# Patient Record
Sex: Female | Born: 1935 | Race: White | Hispanic: No | Smoking: Never smoker
Health system: Southern US, Community
[De-identification: ages and names within clinical notes are randomized; demographics above are authoritative.]

## PROBLEM LIST (undated history)

## (undated) DIAGNOSIS — I10 Essential (primary) hypertension: Secondary | ICD-10-CM

## (undated) DIAGNOSIS — M1990 Unspecified osteoarthritis, unspecified site: Secondary | ICD-10-CM

## (undated) DIAGNOSIS — G629 Polyneuropathy, unspecified: Secondary | ICD-10-CM

## (undated) DIAGNOSIS — N189 Chronic kidney disease, unspecified: Secondary | ICD-10-CM

## (undated) DIAGNOSIS — J302 Other seasonal allergic rhinitis: Secondary | ICD-10-CM

## (undated) DIAGNOSIS — E7800 Pure hypercholesterolemia, unspecified: Secondary | ICD-10-CM

## (undated) DIAGNOSIS — R197 Diarrhea, unspecified: Secondary | ICD-10-CM

## (undated) DIAGNOSIS — C4490 Unspecified malignant neoplasm of skin, unspecified: Secondary | ICD-10-CM

## (undated) HISTORY — PX: APPENDECTOMY: SHX54

## (undated) HISTORY — DX: Chronic kidney disease, unspecified: N18.9

## (undated) HISTORY — PX: CATARACT EXTRACTION: SUR2

---
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
## 1998-05-11 HISTORY — PX: ABDOMINAL HYSTERECTOMY: SHX81

## 2010-09-30 DIAGNOSIS — M4727 Other spondylosis with radiculopathy, lumbosacral region: Secondary | ICD-10-CM | POA: Insufficient documentation

## 2011-04-13 DIAGNOSIS — M7750 Other enthesopathy of unspecified foot: Secondary | ICD-10-CM | POA: Insufficient documentation

## 2011-09-03 DIAGNOSIS — M8580 Other specified disorders of bone density and structure, unspecified site: Secondary | ICD-10-CM | POA: Insufficient documentation

## 2011-09-03 DIAGNOSIS — I10 Essential (primary) hypertension: Secondary | ICD-10-CM | POA: Insufficient documentation

## 2011-09-03 DIAGNOSIS — K5730 Diverticulosis of large intestine without perforation or abscess without bleeding: Secondary | ICD-10-CM | POA: Insufficient documentation

## 2012-08-12 DIAGNOSIS — M7060 Trochanteric bursitis, unspecified hip: Secondary | ICD-10-CM | POA: Insufficient documentation

## 2012-08-23 DIAGNOSIS — S76019A Strain of muscle, fascia and tendon of unspecified hip, initial encounter: Secondary | ICD-10-CM | POA: Insufficient documentation

## 2012-09-30 DIAGNOSIS — R5383 Other fatigue: Secondary | ICD-10-CM | POA: Insufficient documentation

## 2013-09-01 DIAGNOSIS — M19079 Primary osteoarthritis, unspecified ankle and foot: Secondary | ICD-10-CM | POA: Insufficient documentation

## 2014-04-24 DIAGNOSIS — M2021 Hallux rigidus, right foot: Secondary | ICD-10-CM | POA: Insufficient documentation

## 2014-08-14 DIAGNOSIS — M2041 Other hammer toe(s) (acquired), right foot: Secondary | ICD-10-CM | POA: Insufficient documentation

## 2014-08-14 DIAGNOSIS — M109 Gout, unspecified: Secondary | ICD-10-CM | POA: Insufficient documentation

## 2014-08-14 DIAGNOSIS — Z8619 Personal history of other infectious and parasitic diseases: Secondary | ICD-10-CM | POA: Insufficient documentation

## 2014-08-14 DIAGNOSIS — G2581 Restless legs syndrome: Secondary | ICD-10-CM | POA: Insufficient documentation

## 2014-08-14 DIAGNOSIS — E7800 Pure hypercholesterolemia, unspecified: Secondary | ICD-10-CM | POA: Insufficient documentation

## 2014-08-14 DIAGNOSIS — M21611 Bunion of right foot: Secondary | ICD-10-CM | POA: Insufficient documentation

## 2014-08-14 DIAGNOSIS — M1A9XX0 Chronic gout, unspecified, without tophus (tophi): Secondary | ICD-10-CM

## 2014-10-15 ENCOUNTER — Encounter: Payer: Self-pay | Admitting: *Deleted

## 2014-10-23 NOTE — Discharge Instructions (Signed)

## 2014-10-24 ENCOUNTER — Ambulatory Visit
Admission: RE | Admit: 2014-10-24 | Discharge: 2014-10-24 | Disposition: A | Discharge status: Home / Self Care | Payer: Medicare Other | Source: Ambulatory Visit | Attending: Ophthalmology | Admitting: Ophthalmology

## 2014-10-24 ENCOUNTER — Encounter
Admission: RE | Disposition: A | Discharge status: Home / Self Care | Payer: Self-pay | Source: Ambulatory Visit | Attending: Ophthalmology

## 2014-10-24 ENCOUNTER — Ambulatory Visit: Payer: Medicare Other | Admitting: Anesthesiology

## 2014-10-24 DIAGNOSIS — Z90710 Acquired absence of both cervix and uterus: Secondary | ICD-10-CM | POA: Insufficient documentation

## 2014-10-24 DIAGNOSIS — M1990 Unspecified osteoarthritis, unspecified site: Secondary | ICD-10-CM | POA: Diagnosis not present

## 2014-10-24 DIAGNOSIS — H2511 Age-related nuclear cataract, right eye: Secondary | ICD-10-CM | POA: Diagnosis not present

## 2014-10-24 DIAGNOSIS — I10 Essential (primary) hypertension: Secondary | ICD-10-CM | POA: Diagnosis not present

## 2014-10-24 DIAGNOSIS — E780 Pure hypercholesterolemia: Secondary | ICD-10-CM | POA: Diagnosis not present

## 2014-10-24 DIAGNOSIS — M109 Gout, unspecified: Secondary | ICD-10-CM | POA: Diagnosis not present

## 2014-10-24 DIAGNOSIS — G629 Polyneuropathy, unspecified: Secondary | ICD-10-CM | POA: Diagnosis not present

## 2014-10-24 DIAGNOSIS — Z85828 Personal history of other malignant neoplasm of skin: Secondary | ICD-10-CM | POA: Insufficient documentation

## 2014-10-24 DIAGNOSIS — Z9842 Cataract extraction status, left eye: Secondary | ICD-10-CM | POA: Insufficient documentation

## 2014-10-24 DIAGNOSIS — Z885 Allergy status to narcotic agent status: Secondary | ICD-10-CM | POA: Diagnosis not present

## 2014-10-24 HISTORY — DX: Unspecified osteoarthritis, unspecified site: M19.90

## 2014-10-24 HISTORY — DX: Other seasonal allergic rhinitis: J30.2

## 2014-10-24 HISTORY — DX: Essential (primary) hypertension: I10

## 2014-10-24 HISTORY — PX: CATARACT EXTRACTION W/PHACO: SHX586

## 2014-10-24 HISTORY — DX: Pure hypercholesterolemia, unspecified: E78.00

## 2014-10-24 HISTORY — DX: Polyneuropathy, unspecified: G62.9

## 2014-10-24 HISTORY — DX: Unspecified malignant neoplasm of skin, unspecified: C44.90

## 2014-10-24 SURGERY — PHACOEMULSIFICATION, CATARACT, WITH IOL INSERTION
Anesthesia: Monitor Anesthesia Care | Laterality: Right | Wound class: Clean

## 2014-10-24 MED ORDER — BRIMONIDINE TARTRATE 0.2 % OP SOLN
OPHTHALMIC | Status: DC | PRN
  Administered 2014-10-24: 1 [drp] via OPHTHALMIC

## 2014-10-24 MED ORDER — TETRACAINE HCL 0.5 % OP SOLN
1.0000 [drp] | OPHTHALMIC | Status: DC | PRN
  Administered 2014-10-24: 1 [drp] via OPHTHALMIC

## 2014-10-24 MED ORDER — EPINEPHRINE HCL 1 MG/ML IJ SOLN
INTRAOCULAR | Status: DC | PRN
  Administered 2014-10-24: 72 mL via OPHTHALMIC

## 2014-10-24 MED ORDER — MOXIFLOXACIN HCL 0.5 % OP SOLN
OPHTHALMIC | Status: DC | PRN
  Administered 2014-10-24: 1 [drp] via OPHTHALMIC

## 2014-10-24 MED ORDER — FENTANYL CITRATE (PF) 100 MCG/2ML IJ SOLN
INTRAMUSCULAR | Status: DC | PRN
  Administered 2014-10-24: 50 ug via INTRAVENOUS

## 2014-10-24 MED ORDER — TIMOLOL MALEATE 0.5 % OP SOLN
OPHTHALMIC | Status: DC | PRN
  Administered 2014-10-24: 1 [drp] via OPHTHALMIC

## 2014-10-24 MED ORDER — NA HYALUR & NA CHOND-NA HYALUR 0.4-0.35 ML IO KIT
PACK | INTRAOCULAR | Status: DC | PRN
  Administered 2014-10-24: 1 mL via INTRAOCULAR

## 2014-10-24 MED ORDER — ARMC OPHTHALMIC DILATING GEL
1.0000 "application " | OPHTHALMIC | Status: DC | PRN
  Administered 2014-10-24 (×2): 1 via OPHTHALMIC

## 2014-10-24 MED ORDER — MIDAZOLAM HCL 2 MG/2ML IJ SOLN
INTRAMUSCULAR | Status: DC | PRN
  Administered 2014-10-24: 1.5 mg via INTRAVENOUS

## 2014-10-24 MED ORDER — POVIDONE-IODINE 5 % OP SOLN
1.0000 "application " | OPHTHALMIC | Status: DC | PRN
  Administered 2014-10-24: 1 via OPHTHALMIC

## 2014-10-24 SURGICAL SUPPLY — 26 items
CANNULA ANT/CHMB 27GA (MISCELLANEOUS) ×2 IMPLANT
GLOVE SURG LX 7.5 STRW (GLOVE) ×1
GLOVE SURG LX STRL 7.5 STRW (GLOVE) ×1 IMPLANT
GLOVE SURG TRIUMPH 8.0 PF LTX (GLOVE) ×2 IMPLANT
GOWN STRL REUS W/ TWL LRG LVL3 (GOWN DISPOSABLE) ×2 IMPLANT
GOWN STRL REUS W/TWL LRG LVL3 (GOWN DISPOSABLE) ×2
LENS IOL ACRSF IQ PC 23.5 (Intraocular Lens) ×1 IMPLANT
LENS IOL ACRYSOF IQ POST 23.5 (Intraocular Lens) ×2 IMPLANT
MARKER SKIN SURG W/RULER VIO (MISCELLANEOUS) ×2 IMPLANT
NDL RETROBULBAR .5 NSTRL (NEEDLE) IMPLANT
NEEDLE FILTER BLUNT 18X 1/2SAF (NEEDLE) ×1
NEEDLE FILTER BLUNT 18X1 1/2 (NEEDLE) ×1 IMPLANT
PACK CATARACT BRASINGTON (MISCELLANEOUS) ×2 IMPLANT
PACK EYE AFTER SURG (MISCELLANEOUS) ×2 IMPLANT
PACK OPTHALMIC (MISCELLANEOUS) ×2 IMPLANT
RING MALYGIN 7.0 (MISCELLANEOUS) IMPLANT
SUT ETHILON 10-0 CS-B-6CS-B-6 (SUTURE)
SUT VICRYL  9 0 (SUTURE)
SUT VICRYL 9 0 (SUTURE) IMPLANT
SUTURE EHLN 10-0 CS-B-6CS-B-6 (SUTURE) IMPLANT
SYR 3ML LL SCALE MARK (SYRINGE) ×2 IMPLANT
SYR 5ML LL (SYRINGE) IMPLANT
SYR TB 1ML LUER SLIP (SYRINGE) ×2 IMPLANT
WATER STERILE IRR 250ML POUR (IV SOLUTION) ×2 IMPLANT
WATER STERILE IRR 500ML POUR (IV SOLUTION) IMPLANT
WIPE NON LINTING 3.25X3.25 (MISCELLANEOUS) ×2 IMPLANT

## 2014-10-24 NOTE — Anesthesia Preprocedure Evaluation (Signed)
Anesthesia Evaluation  Patient identified by MRN, date of birth, ID band Patient awake    Reviewed: Allergy & Precautions, NPO status , Patient's Chart, lab work & pertinent test results  Airway Mallampati: II  TM Distance: >3 FB Neck ROM: Full    Dental no notable dental hx.    Pulmonary neg pulmonary ROS,  breath sounds clear to auscultation  Pulmonary exam normal       Cardiovascular hypertension, negative cardio ROS Normal cardiovascular examRhythm:Regular Rate:Normal     Neuro/Psych negative neurological ROS  negative psych ROS   GI/Hepatic negative GI ROS, Neg liver ROS,   Endo/Other  negative endocrine ROS  Renal/GU negative Renal ROS  negative genitourinary   Musculoskeletal negative musculoskeletal ROS (+) Arthritis -, Osteoarthritis,    Abdominal   Peds negative pediatric ROS (+)  Hematology negative hematology ROS (+)   Anesthesia Other Findings   Reproductive/Obstetrics negative OB ROS                             Anesthesia Physical Anesthesia Plan  ASA: II  Anesthesia Plan: MAC   Post-op Pain Management:    Induction: Intravenous  Airway Management Planned:   Additional Equipment:   Intra-op Plan:   Post-operative Plan: Extubation in OR  Informed Consent: I have reviewed the patients History and Physical, chart, labs and discussed the procedure including the risks, benefits and alternatives for the proposed anesthesia with the patient or authorized representative who has indicated his/her understanding and acceptance.   Dental advisory given  Plan Discussed with: CRNA  Anesthesia Plan Comments:         Anesthesia Quick Evaluation

## 2014-10-24 NOTE — Op Note (Signed)
LOCATION:  [facility]   PREOPERATIVE DIAGNOSIS:    Nuclear sclerotic cataract right eye. H25.11   POSTOPERATIVE DIAGNOSIS:  Nuclear sclerotic cataract right eye.     PROCEDURE:  Phacoemusification with posterior chamber intraocular lens placement of the right eye   LENS:   Implant Name Type Inv. Item Serial No. Manufacturer Lot No. LRB No. Used  IMPLANT LENS - [id number] Intraocular Lens IMPLANT LENS [id number] ALCON   Right 1     [id number] 19.0 D   ULTRASOUND TIME: 11 % of 1 minutes, 14 seconds.  CDE 8.3   SURGEON:  Wyonia Hough, MD   ANESTHESIA:  Topical with tetracaine drops and 2% Xylocaine jelly.   COMPLICATIONS:  None.   DESCRIPTION OF PROCEDURE:  The patient was identified in the holding room and transported to the operating room and placed in the supine position under the operating microscope.  The right eye was identified as the operative eye and it was prepped and draped in the usual sterile ophthalmic fashion.   A 1 millimeter clear-corneal paracentesis was made at the 12:00 position.  The anterior chamber was filled with Viscoat viscoelastic.  A 2.4 millimeter keratome was used to make a near-clear corneal incision at the 9:00 position.  A curvilinear capsulorrhexis was made with a cystotome and capsulorrhexis forceps.  Balanced salt solution was used to hydrodissect and hydrodelineate the nucleus.   Phacoemulsification was then used in stop and chop fashion to remove the lens nucleus and epinucleus.  The remaining cortex was then removed using the irrigation and aspiration handpiece. Provisc was then placed into the capsular bag to distend it for lens placement.  A lens was then injected into the capsular bag.  The remaining viscoelastic was aspirated.   Wounds were hydrated with balanced salt solution.  The anterior chamber was inflated to a physiologic pressure with balanced salt solution.  No wound leaks were noted. Vigamox 0.2 ml of a 1mg  per ml  solution was injected into the anterior chamber for a dose of 0.2 mg of intracameral antibiotic at the completion of the case.   Timolol and Brimonidine drops were applied to the eye.  The patient was taken to the recovery room in stable condition without complications of anesthesia or surgery.   Tina Curtis 10/24/2014, 12:28 PM

## 2014-10-24 NOTE — Anesthesia Postprocedure Evaluation (Signed)
  Anesthesia Post-op Note  Patient: Tina Curtis  Procedure(s) Performed: Procedure(s): CATARACT EXTRACTION PHACO AND INTRAOCULAR LENS PLACEMENT (IOC) (Right)  Anesthesia type:MAC  Patient location: PACU  Post pain: Pain level controlled  Post assessment: Post-op Vital signs reviewed, Patient's Cardiovascular Status Stable, Respiratory Function Stable, Patent Airway and No signs of Nausea or vomiting  Post vital signs: Reviewed and stable  Last Vitals:  Filed Vitals:   10/24/14 1234  BP:   Pulse: 58  Temp: 36.4 C  Resp: 14    Level of consciousness: awake, alert  and patient cooperative  Complications: No apparent anesthesia complications

## 2014-10-24 NOTE — Transfer of Care (Signed)
Immediate Anesthesia Transfer of Care Note  Patient: Tina Curtis  Procedure(s) Performed: Procedure(s): CATARACT EXTRACTION PHACO AND INTRAOCULAR LENS PLACEMENT (IOC) (Right)  Patient Location: PACU  Anesthesia Type: MAC  Level of Consciousness: awake, alert  and patient cooperative  Airway and Oxygen Therapy: Patient Spontanous Breathing and Patient connected to supplemental oxygen  Post-op Assessment: Post-op Vital signs reviewed, Patient's Cardiovascular Status Stable, Respiratory Function Stable, Patent Airway and No signs of Nausea or vomiting  Post-op Vital Signs: Reviewed and stable  Complications: No apparent anesthesia complications

## 2014-10-24 NOTE — H&P (Signed)
  The History and Physical notes were scanned in.  The patient remains stable and unchanged from the H&P.   Previous H&P reviewed, patient examined, and there are no changes.  Amber Guthridge 10/24/2014 11:32 AM

## 2014-10-24 NOTE — Anesthesia Procedure Notes (Signed)
Procedure Name: MAC Date/Time: 10/24/2014 12:13 PM Performed by: Mayme Genta Pre-anesthesia Checklist: Patient identified, Emergency Drugs available, Suction available, Timeout performed and Patient being monitored Patient Re-evaluated:Patient Re-evaluated prior to inductionOxygen Delivery Method: Nasal cannula Placement Confirmation: positive ETCO2

## 2014-10-25 ENCOUNTER — Encounter: Payer: Self-pay | Admitting: Ophthalmology

## 2018-02-17 ENCOUNTER — Encounter: Payer: Self-pay | Admitting: Emergency Medicine

## 2018-02-17 ENCOUNTER — Other Ambulatory Visit: Payer: Self-pay

## 2018-02-17 ENCOUNTER — Emergency Department: Payer: Medicare Other

## 2018-02-17 ENCOUNTER — Inpatient Hospital Stay
Admission: EM | Admit: 2018-02-17 | Discharge: 2018-02-22 | DRG: 683 | Disposition: A | Discharge status: Home Health | Payer: Medicare Other | Attending: Internal Medicine | Admitting: Internal Medicine

## 2018-02-17 DIAGNOSIS — R410 Disorientation, unspecified: Secondary | ICD-10-CM

## 2018-02-17 DIAGNOSIS — Z79899 Other long term (current) drug therapy: Secondary | ICD-10-CM | POA: Diagnosis not present

## 2018-02-17 DIAGNOSIS — E7800 Pure hypercholesterolemia, unspecified: Secondary | ICD-10-CM | POA: Diagnosis present

## 2018-02-17 DIAGNOSIS — B9620 Unspecified Escherichia coli [E. coli] as the cause of diseases classified elsewhere: Secondary | ICD-10-CM | POA: Diagnosis present

## 2018-02-17 DIAGNOSIS — G4700 Insomnia, unspecified: Secondary | ICD-10-CM | POA: Diagnosis present

## 2018-02-17 DIAGNOSIS — R4182 Altered mental status, unspecified: Secondary | ICD-10-CM

## 2018-02-17 DIAGNOSIS — Z90710 Acquired absence of both cervix and uterus: Secondary | ICD-10-CM

## 2018-02-17 DIAGNOSIS — I129 Hypertensive chronic kidney disease with stage 1 through stage 4 chronic kidney disease, or unspecified chronic kidney disease: Secondary | ICD-10-CM | POA: Diagnosis present

## 2018-02-17 DIAGNOSIS — E785 Hyperlipidemia, unspecified: Secondary | ICD-10-CM | POA: Diagnosis present

## 2018-02-17 DIAGNOSIS — N179 Acute kidney failure, unspecified: Secondary | ICD-10-CM | POA: Diagnosis present

## 2018-02-17 DIAGNOSIS — R251 Tremor, unspecified: Secondary | ICD-10-CM | POA: Diagnosis present

## 2018-02-17 DIAGNOSIS — N184 Chronic kidney disease, stage 4 (severe): Secondary | ICD-10-CM | POA: Diagnosis present

## 2018-02-17 DIAGNOSIS — Z66 Do not resuscitate: Secondary | ICD-10-CM | POA: Diagnosis present

## 2018-02-17 DIAGNOSIS — Z85828 Personal history of other malignant neoplasm of skin: Secondary | ICD-10-CM

## 2018-02-17 DIAGNOSIS — M109 Gout, unspecified: Secondary | ICD-10-CM | POA: Diagnosis present

## 2018-02-17 DIAGNOSIS — F329 Major depressive disorder, single episode, unspecified: Secondary | ICD-10-CM | POA: Diagnosis present

## 2018-02-17 DIAGNOSIS — N289 Disorder of kidney and ureter, unspecified: Secondary | ICD-10-CM

## 2018-02-17 DIAGNOSIS — N390 Urinary tract infection, site not specified: Secondary | ICD-10-CM

## 2018-02-17 DIAGNOSIS — Z880 Allergy status to penicillin: Secondary | ICD-10-CM | POA: Diagnosis not present

## 2018-02-17 DIAGNOSIS — G629 Polyneuropathy, unspecified: Secondary | ICD-10-CM | POA: Diagnosis present

## 2018-02-17 DIAGNOSIS — F05 Delirium due to known physiological condition: Secondary | ICD-10-CM | POA: Diagnosis present

## 2018-02-17 DIAGNOSIS — E860 Dehydration: Secondary | ICD-10-CM | POA: Diagnosis present

## 2018-02-17 DIAGNOSIS — I951 Orthostatic hypotension: Secondary | ICD-10-CM | POA: Diagnosis present

## 2018-02-17 DIAGNOSIS — N189 Chronic kidney disease, unspecified: Secondary | ICD-10-CM

## 2018-02-17 LAB — URINALYSIS, COMPLETE (UACMP) WITH MICROSCOPIC
BILIRUBIN URINE: NEGATIVE
Bacteria, UA: NONE SEEN
Glucose, UA: NEGATIVE mg/dL
Hgb urine dipstick: NEGATIVE
Ketones, ur: NEGATIVE mg/dL
Nitrite: NEGATIVE
PH: 5 (ref 5.0–8.0)
Protein, ur: NEGATIVE mg/dL
Specific Gravity, Urine: 1.013 (ref 1.005–1.030)

## 2018-02-17 LAB — COMPREHENSIVE METABOLIC PANEL
ALBUMIN: 4.8 g/dL (ref 3.5–5.0)
ALT: 20 U/L (ref 0–44)
AST: 32 U/L (ref 15–41)
Alkaline Phosphatase: 92 U/L (ref 38–126)
Anion gap: 11 (ref 5–15)
BUN: 70 mg/dL — ABNORMAL HIGH (ref 8–23)
CHLORIDE: 105 mmol/L (ref 98–111)
CO2: 20 mmol/L — ABNORMAL LOW (ref 22–32)
Calcium: 9.5 mg/dL (ref 8.9–10.3)
Creatinine, Ser: 3.2 mg/dL — ABNORMAL HIGH (ref 0.44–1.00)
GFR calc Af Amer: 14 mL/min — ABNORMAL LOW (ref >60)
GFR, EST NON AFRICAN AMERICAN: 12 mL/min — AB (ref >60)
GLUCOSE: 117 mg/dL — AB (ref 70–99)
POTASSIUM: 5 mmol/L (ref 3.5–5.1)
Sodium: 136 mmol/L (ref 135–145)
Total Bilirubin: 0.7 mg/dL (ref 0.3–1.2)
Total Protein: 7.8 g/dL (ref 6.5–8.1)

## 2018-02-17 LAB — TROPONIN I: Troponin I: <0.03 ng/mL (ref <0.03)

## 2018-02-17 LAB — CBC
HCT: 35.7 % — ABNORMAL LOW (ref 36.0–46.0)
Hemoglobin: 12.1 g/dL (ref 12.0–15.0)
MCH: 31.8 pg (ref 26.0–34.0)
MCHC: 33.9 g/dL (ref 30.0–36.0)
MCV: 93.7 fL (ref 80.0–100.0)
Platelets: 165 10*3/uL (ref 150–400)
RBC: 3.81 MIL/uL — ABNORMAL LOW (ref 3.87–5.11)
RDW: 13.2 % (ref 11.5–15.5)
WBC: 8.9 10*3/uL (ref 4.0–10.5)
nRBC: 0 % (ref 0.0–0.2)

## 2018-02-17 LAB — MRSA PCR SCREENING: MRSA by PCR: POSITIVE — AB

## 2018-02-17 MED ORDER — BUPROPION HCL ER (XL) 150 MG PO TB24
150.0000 mg | ORAL_TABLET | Freq: Every day | ORAL | Status: DC
  Administered 2018-02-18 – 2018-02-19 (×2): 150 mg via ORAL
  Filled 2018-02-17 (×2): qty 1

## 2018-02-17 MED ORDER — ONDANSETRON HCL 4 MG PO TABS: 4.0000 mg | ORAL_TABLET | Freq: Four times a day (QID) | ORAL | Status: DC | PRN

## 2018-02-17 MED ORDER — LEVOFLOXACIN IN D5W 500 MG/100ML IV SOLN: 500.0000 mg | INTRAVENOUS | Status: DC

## 2018-02-17 MED ORDER — LORATADINE 10 MG PO TABS: 10.0000 mg | ORAL_TABLET | Freq: Every day | ORAL | Status: DC | PRN

## 2018-02-17 MED ORDER — SODIUM CHLORIDE 0.9 % IV SOLN
1.0000 g | Freq: Once | INTRAVENOUS | Status: AC
  Administered 2018-02-17: 1 g via INTRAVENOUS
  Filled 2018-02-17: qty 10

## 2018-02-17 MED ORDER — SODIUM CHLORIDE 0.9 % IV BOLUS
500.0000 mL | Freq: Once | INTRAVENOUS | Status: AC
  Administered 2018-02-17: 500 mL via INTRAVENOUS

## 2018-02-17 MED ORDER — SODIUM CHLORIDE 0.9 % IV SOLN
INTRAVENOUS | Status: AC
  Administered 2018-02-17 – 2018-02-18 (×2): via INTRAVENOUS

## 2018-02-17 MED ORDER — GABAPENTIN 300 MG PO CAPS
300.0000 mg | ORAL_CAPSULE | Freq: Every day | ORAL | Status: DC
  Administered 2018-02-17 – 2018-02-21 (×5): 300 mg via ORAL
  Filled 2018-02-17 (×5): qty 1

## 2018-02-17 MED ORDER — METOPROLOL SUCCINATE ER 25 MG PO TB24
37.5000 mg | ORAL_TABLET | Freq: Every day | ORAL | Status: DC
  Administered 2018-02-17 – 2018-02-21 (×5): 37.5 mg via ORAL
  Filled 2018-02-17 (×5): qty 2

## 2018-02-17 MED ORDER — ENOXAPARIN SODIUM 30 MG/0.3ML ~~LOC~~ SOLN
30.0000 mg | SUBCUTANEOUS | Status: DC
  Administered 2018-02-17 – 2018-02-21 (×5): 30 mg via SUBCUTANEOUS
  Filled 2018-02-17 (×5): qty 0.3

## 2018-02-17 MED ORDER — SIMVASTATIN 20 MG PO TABS
20.0000 mg | ORAL_TABLET | Freq: Every evening | ORAL | Status: DC
  Administered 2018-02-17 – 2018-02-21 (×4): 20 mg via ORAL
  Filled 2018-02-17 (×6): qty 1

## 2018-02-17 MED ORDER — DOCUSATE SODIUM 100 MG PO CAPS
100.0000 mg | ORAL_CAPSULE | Freq: Two times a day (BID) | ORAL | Status: DC
  Administered 2018-02-18 – 2018-02-22 (×9): 100 mg via ORAL
  Filled 2018-02-17 (×9): qty 1

## 2018-02-17 MED ORDER — PANTOPRAZOLE SODIUM 40 MG PO TBEC
40.0000 mg | DELAYED_RELEASE_TABLET | Freq: Every day | ORAL | Status: DC
  Administered 2018-02-18 – 2018-02-22 (×5): 40 mg via ORAL
  Filled 2018-02-17 (×5): qty 1

## 2018-02-17 MED ORDER — CITALOPRAM HYDROBROMIDE 20 MG PO TABS
20.0000 mg | ORAL_TABLET | Freq: Every day | ORAL | Status: DC
  Administered 2018-02-18 – 2018-02-22 (×5): 20 mg via ORAL
  Filled 2018-02-17 (×5): qty 1

## 2018-02-17 MED ORDER — ONDANSETRON HCL 4 MG/2ML IJ SOLN: 4.0000 mg | Freq: Four times a day (QID) | INTRAMUSCULAR | Status: DC | PRN

## 2018-02-17 MED ORDER — FAMOTIDINE 20 MG PO TABS
20.0000 mg | ORAL_TABLET | Freq: Every day | ORAL | Status: DC
  Administered 2018-02-17 – 2018-02-21 (×5): 20 mg via ORAL
  Filled 2018-02-17 (×5): qty 1

## 2018-02-17 MED ORDER — ACETAMINOPHEN 500 MG PO TABS
500.0000 mg | ORAL_TABLET | Freq: Four times a day (QID) | ORAL | Status: DC | PRN
  Administered 2018-02-18 – 2018-02-21 (×3): 500 mg via ORAL
  Filled 2018-02-17 (×3): qty 1

## 2018-02-17 MED ORDER — LEVOFLOXACIN IN D5W 750 MG/150ML IV SOLN
750.0000 mg | Freq: Once | INTRAVENOUS | Status: AC
  Administered 2018-02-17: 750 mg via INTRAVENOUS
  Filled 2018-02-17: qty 150

## 2018-02-17 MED ORDER — HYDRALAZINE HCL 25 MG PO TABS
25.0000 mg | ORAL_TABLET | Freq: Two times a day (BID) | ORAL | Status: DC
  Administered 2018-02-17 – 2018-02-18 (×2): 25 mg via ORAL
  Filled 2018-02-17 (×2): qty 1

## 2018-02-17 NOTE — Consult Note (Signed)
Pharmacy Antibiotic Note  Tina Curtis is a 84 y.o. female admitted on 02/17/2018 with UTI.  Pharmacy has been consulted for Levaquin dosing. She is noted to be dehydrated and in acute renal failure currently. There are no recent urine cultures available to guide therapy.  Plan: Levaquin 750mg  IV now, then 500mg  IV every 48 hours  Height: 5\' 4"  (162.6 cm) Weight: 146 lb (66.2 kg) IBW/kg (Calculated) : 54.7  Temp (24hrs), Avg:98.4 F (36.9 C), Min:98.4 F (36.9 C), Max:98.4 F (36.9 C)  Recent Labs  Lab 02/17/18 1607  WBC 8.9  CREATININE 3.20*    Estimated Creatinine Clearance: 11.4 mL/min (A) (by C-G formula based on SCr of 3.2 mg/dL (H)).    Allergies  Allergen Reactions  . Penicillins Anaphylaxis    Antimicrobials this admission: Levaquin 10/10 >>  Ceftriaxone  10/10 x1  Microbiology results: 10/10 UCx: pending   Thank you for allowing pharmacy to be a part of this patient's care.  Dallie Piles, PharmD 02/17/2018 6:56 PM

## 2018-02-17 NOTE — Progress Notes (Signed)
Family Meeting Note  Advance Directive:yes  Today a meeting took place with the [name]   Patient is unable to participate due FQ:HKUVJD capacity delerious   The following clinical team members were present during this meeting:MD  The following were discussed:Patient's diagnosis: Altered mental status with delirium, acute kidney injury on chronic kidney disease stage IV, urinary tract infection, other comorbidities hyperlipidemia, hypertension, chronic gout, treatment plan of care discussed in detail with the patient and her niece at bedside.  They verbalized understanding of the plan    patient's progosis: Unable to determine and Goals for treatment: DNR  Patient's daughter [name] is a healthcare power of attorney  Additional follow-up to be provided: Hospitalist  Time spent during discussion:17 min  Nicholes Mango, MD

## 2018-02-17 NOTE — ED Notes (Signed)
Pt assisted to restroom, steady gait with 1 assist.

## 2018-02-17 NOTE — ED Triage Notes (Addendum)
Pt arrived via ems from [facility] after pressing her medical alert button. Pt has no recollection of pressing it. Upon arrival to ED but is alert & oriented to person and place but pt does report some intermittent confusion for the past month.

## 2018-02-17 NOTE — ED Provider Notes (Signed)
[facility] Emergency Department Provider Note ____________________________________________   First MD Initiated Contact with Patient 02/17/18 1606     (approximate)  I have reviewed the triage vital signs and the nursing notes.   HISTORY  Chief Complaint Altered Mental Status  Level 5 caveat: History of present illness limited due to altered mental status  HPI Tina Curtis is a 84 y.o. female with PMH as noted below who presents with altered mental status approximately over the last several days.  Patient lives at [facility] and was brought to the ED by EMS after she pressed her medical alert button.  She does not remember pressing it and cannot note any specific reason she wanted to come to the hospital.  However, she does state that she feels a bit weak and feels like her stomach is gassy.  She also thinks that her lips feel dry.  Past Medical History:  Diagnosis Date  . Arthritis    Gout  . Hypercholesteremia   . Hypertension   . Neuropathy    feet and lower legs  . Seasonal allergies   . Skin cancer    face    Patient Active Problem List   Diagnosis Date Noted  . AKI (acute kidney injury) ([place]) 02/17/2018    Past Surgical History:  Procedure Laterality Date  . ABDOMINAL HYSTERECTOMY  2000  . APPENDECTOMY    . CATARACT EXTRACTION Left   . CATARACT EXTRACTION W/PHACO Right 10/24/2014   Procedure: CATARACT EXTRACTION PHACO AND INTRAOCULAR LENS PLACEMENT (IOC);  Surgeon: Leandrew Koyanagi, MD;  Location: [facility];  Service: Ophthalmology;  Laterality: Right;    Prior to Admission medications   Medication Sig Start Date End Date Taking? Authorizing Provider  allopurinol (ZYLOPRIM) 300 MG tablet Take 300 mg by mouth daily. AM   Yes [provider]  buPROPion (WELLBUTRIN XL) 150 MG 24 hr tablet Take 150 mg by mouth daily. 02/14/18 02/14/19 Yes [provider]  citalopram (CELEXA) 20 MG tablet Take 20 mg by mouth  daily. 02/14/18  Yes [provider]  gabapentin (NEURONTIN) 300 MG capsule Take 300 mg by mouth at bedtime.    Yes [provider]  hydrALAZINE (APRESOLINE) 25 MG tablet Take 25 mg by mouth 2 (two) times daily. 02/14/18 02/14/19 Yes [provider]  losartan-hydrochlorothiazide (HYZAAR) 100-25 MG tablet Take 1 tablet by mouth daily. 02/14/18 02/14/19 Yes [provider]  metoprolol succinate (TOPROL-XL) 25 MG 24 hr tablet Take 37.5 mg by mouth at bedtime.    Yes [provider]  Multiple Vitamins-Minerals (ICAPS PO) Take by mouth daily.   Yes [provider]  simvastatin (ZOCOR) 20 MG tablet Take 20 mg by mouth daily. PM   Yes [provider]  acetaminophen (TYLENOL) 500 MG tablet Take 500 mg by mouth every 6 (six) hours as needed.    [provider]  Cholecalciferol (VITAMIN D PO) Take by mouth.    [provider]  loratadine (CLARITIN) 10 MG tablet Take 10 mg by mouth daily as needed for allergies. AM    [provider]  losartan (COZAAR) 50 MG tablet Take 50 mg by mouth daily. AM    [provider]  Probiotic Product (ALIGN PO) Take by mouth. AM    [provider]    Allergies Penicillins  No family history on file.  Social History Social History   Tobacco Use  . Smoking status: Never Smoker  Substance Use Topics  .  Alcohol use: No  . Drug use: Not on file    Review of Systems Level 5 caveat: Unable to obtain review of systems due to altered mental status    ____________________________________________   PHYSICAL EXAM:  VITAL SIGNS: ED Triage Vitals  Enc Vitals Group     BP 02/17/18 1604 (!) 153/73     Pulse Rate 02/17/18 1604 74     Resp 02/17/18 1604 18     Temp 02/17/18 1604 98.4 F (36.9 C)     Temp Source 02/17/18 1604 Oral     SpO2 02/17/18 1604 96 %     Weight 02/17/18 1602 146 lb (66.2 kg)     Height 02/17/18 1602 5\' 4"  (1.626 m)     Head Circumference  --      Peak Flow --      Pain Score 02/17/18 1602 0     Pain Loc --      Pain Edu? --      Excl. in [place]? --     Constitutional: Alert, confused.  Answering some questions appropriately but otherwise tangential.  Relatively comfortable appearing. Eyes: Conjunctivae are normal.  EOMI.  PERRLA. Head: Atraumatic. Nose: No congestion/rhinnorhea. Mouth/Throat: Mucous membranes are dry.   Neck: Normal range of motion.  Cardiovascular: Normal rate, regular rhythm. Grossly normal heart sounds.  Good peripheral circulation. Respiratory: Normal respiratory effort.  No retractions. Lungs CTAB. Gastrointestinal: Soft and nontender. No distention.  Genitourinary: No flank tenderness. Musculoskeletal: No lower extremity edema.  Extremities warm and well perfused.  Neurologic:  Normal speech and language.  Motor and sensory intact in all extremities.  Normal coordination.  No gross focal neurologic deficits are appreciated.  Skin:  Skin is warm and dry. No rash noted. Psychiatric: Calm and cooperative.  Somewhat tangential and disorganized and answering open-ended questions.  ____________________________________________   LABS (all labs ordered are listed, but only abnormal results are displayed)  Labs Reviewed  COMPREHENSIVE METABOLIC PANEL - Abnormal; Notable for the following components:      Result Value   CO2 20 (*)    Glucose, Bld 117 (*)    BUN 70 (*)    Creatinine, Ser 3.20 (*)    GFR calc non Af Amer 12 (*)    GFR calc Af Amer 14 (*)    All other components within normal limits  CBC - Abnormal; Notable for the following components:   RBC 3.81 (*)    HCT 35.7 (*)    All other components within normal limits  URINALYSIS, COMPLETE (UACMP) WITH MICROSCOPIC - Abnormal; Notable for the following components:   Color, Urine YELLOW (*)    APPearance CLOUDY (*)    Leukocytes, UA MODERATE (*)    WBC, UA >50 (*)    All other components within normal limits  URINE CULTURE  TROPONIN I    ____________________________________________  EKG  ED ECG REPORT I, Arta Silence, the attending physician, personally viewed and interpreted this ECG.  Date: 02/17/2018 EKG Time: 1607 Rate: 69 Rhythm: normal sinus rhythm QRS Axis: normal Intervals: normal ST/T Wave abnormalities: normal Narrative Interpretation: no evidence of acute ischemia  ____________________________________________  RADIOLOGY  CT head: Chronic microvascular changes with no acute findings  ____________________________________________   PROCEDURES  Procedure(s) performed: No  Procedures  Critical Care performed: No ____________________________________________   INITIAL IMPRESSION / ASSESSMENT AND PLAN / ED COURSE  Pertinent labs & imaging results that were available during my care of the patient were reviewed by me and considered  in my medical decision making (see chart for details).  84 year old female with PMH as noted above presents with altered mental status, and was brought to the ED by EMS after pressing her medical alert button.  Patient cannot recall this.  She reports feeling somewhat gassy and feels that her lips are dry.  The patient's neighbor who knows her well states that she is definitely more confused than usual.  The neighbor saw her most recently about 2 days ago and states she was at her baseline at that time.  I reviewed the past medical records in [place]; the patient was most recently seen by her PMD Dr. Baldemar Lenis 3 days ago for a checkup and had relatively negative evaluation.  On exam, the patient is alert and comfortable appearing.  Her vital signs are normal except for slight hypertension.  Neuro exam is nonfocal.  The patient is confused and although oriented to place she gives tangential and somewhat nonsensical responses to certain questions about why she is here.  Per the neighbor, this is a relatively acute change that I suspect acute cause such as dehydration,  urinary tract infection, other metabolic etiology, or less likely CNS cause.  Will obtain CT head, lab work-up, UA, give fluids, and reassess.  ----------------------------------------- 6:51 PM on 02/17/2018 -----------------------------------------  The patient's lab work-up reveals findings consistent with UTI as well as increase in creatinine and BUN from her baseline renal insufficiency.  I suspect most likely dehydration/prerenal cause, but the patient will require admission.  I signed the patient out to the hospitalist Dr. Margaretmary Eddy.  ____________________________________________   FINAL CLINICAL IMPRESSION(S) / ED DIAGNOSES  Final diagnoses:  Urinary tract infection without hematuria, site unspecified  Acute on chronic renal insufficiency  Altered mental status, unspecified altered mental status type      NEW MEDICATIONS STARTED DURING THIS VISIT:  New Prescriptions   No medications on file     Note:  This document was prepared using Dragon voice recognition software and may include unintentional dictation errors.    Arta Silence, MD 02/17/18 [phone]

## 2018-02-17 NOTE — H&P (Signed)
[facility] NAME: Tina Curtis    MR#:  [id number]  DATE OF BIRTH:  Sep 19, 1926  DATE OF ADMISSION:  02/17/2018  PRIMARY CARE PHYSICIAN: Derinda Late, MD   REQUESTING/REFERRING PHYSICIAN: Arta Silence, MD  CHIEF COMPLAINT:  Altered mental status with dizziness  HISTORY OF PRESENT ILLNESS:  Tina Curtis  is a 84 y.o. female with a known history of chronic kidney disease stage IV, hypertension hyperlipidemia, seasonal allergies and other medical problems is brought in from [facility] with altered mental status.  Patient is pleasantly confused.  Urinalysis is abnormal and baseline creatinine at 2 today it is at 3.2.  Patient's niece at bedside is reporting that patient has been lately feeling nauseous and not eating or drinking well.  Lips are dry.  Hospitalist team is called to admit the patient.  Patient was given IV antibiotics after urine culture and fluid boluses in the ED.  PAST MEDICAL HISTORY:   Past Medical History:  Diagnosis Date  . Arthritis    Gout  . Hypercholesteremia   . Hypertension   . Neuropathy    feet and lower legs  . Seasonal allergies   . Skin cancer    face    PAST SURGICAL HISTOIRY:   Past Surgical History:  Procedure Laterality Date  . ABDOMINAL HYSTERECTOMY  2000  . APPENDECTOMY    . CATARACT EXTRACTION Left   . CATARACT EXTRACTION W/PHACO Right 10/24/2014   Procedure: CATARACT EXTRACTION PHACO AND INTRAOCULAR LENS PLACEMENT (IOC);  Surgeon: Leandrew Koyanagi, MD;  Location: [facility];  Service: Ophthalmology;  Laterality: Right;    SOCIAL HISTORY:   Social History   Tobacco Use  . Smoking status: Never Smoker  Substance Use Topics  . Alcohol use: No    FAMILY HISTORY:  No family history on file.  DRUG ALLERGIES:   Allergies  Allergen Reactions  . Penicillins Anaphylaxis    REVIEW OF SYSTEMS:  Review of system unobtainable as the patient is  pleasantly confused  MEDICATIONS AT HOME:   Prior to Admission medications   Medication Sig Start Date End Date Taking? Authorizing Provider  allopurinol (ZYLOPRIM) 300 MG tablet Take 300 mg by mouth daily. AM   Yes [provider]  buPROPion (WELLBUTRIN XL) 150 MG 24 hr tablet Take 150 mg by mouth daily. 02/14/18 02/14/19 Yes [provider]  citalopram (CELEXA) 20 MG tablet Take 20 mg by mouth daily. 02/14/18  Yes [provider]  gabapentin (NEURONTIN) 300 MG capsule Take 300 mg by mouth at bedtime.    Yes [provider]  hydrALAZINE (APRESOLINE) 25 MG tablet Take 25 mg by mouth 2 (two) times daily. 02/14/18 02/14/19 Yes [provider]  losartan-hydrochlorothiazide (HYZAAR) 100-25 MG tablet Take 1 tablet by mouth daily. 02/14/18 02/14/19 Yes [provider]  metoprolol succinate (TOPROL-XL) 25 MG 24 hr tablet Take 37.5 mg by mouth at bedtime.    Yes [provider]  Multiple Vitamins-Minerals (ICAPS PO) Take by mouth daily.   Yes [provider]  simvastatin (ZOCOR) 20 MG tablet Take 20 mg by mouth daily. PM   Yes [provider]  acetaminophen (TYLENOL) 500 MG tablet Take 500 mg by mouth every 6 (six) hours as needed.    [provider]  Cholecalciferol (VITAMIN D PO) Take by mouth.    [provider]  loratadine (CLARITIN) 10 MG tablet Take 10 mg by mouth daily as needed for allergies. AM  [provider]  losartan (COZAAR) 50 MG tablet Take 50 mg by mouth daily. AM    [provider]  Probiotic Product (ALIGN PO) Take by mouth. AM    [provider]      VITAL SIGNS:  Blood pressure 139/79, pulse 64, temperature 98.4 F (36.9 C), temperature source Oral, resp. rate (!) 26, height 5\' 4"  (1.626 m), weight 66.2 kg, SpO2 98 %.  PHYSICAL EXAMINATION:  GENERAL:  84 y.o.-year-old patient lying in the bed with no acute distress.  EYES: Pupils equal, round, reactive to  light and accommodation. No scleral icterus. Extraocular muscles intact.  HEENT: Head atraumatic, normocephalic. Oropharynx and nasopharynx clear.  Dry mucous membranes NECK:  Supple, no jugular venous distention. No thyroid enlargement, no tenderness.  LUNGS: Normal breath sounds bilaterally, no wheezing, rales,rhonchi or crepitation. No use of accessory muscles of respiration.  CARDIOVASCULAR: S1, S2 normal. No murmurs, rubs, or gallops.  ABDOMEN: Soft, nontender, nondistended. Bowel sounds present. No organomegaly or mass.  EXTREMITIES: No pedal edema, cyanosis, or clubbing.  NEUROLOGIC: Awake and alert and disoriented. Sensation intact. Gait not checked.  PSYCHIATRIC: The patient is alert and oriented x 1 SKIN: No obvious rash, lesion, or ulcer.   LABORATORY PANEL:   CBC Recent Labs  Lab 02/17/18 1607  WBC 8.9  HGB 12.1  HCT 35.7*  PLT 165   ------------------------------------------------------------------------------------------------------------------  Chemistries  Recent Labs  Lab 02/17/18 1607  NA 136  K 5.0  CL 105  CO2 20*  GLUCOSE 117*  BUN 70*  CREATININE 3.20*  CALCIUM 9.5  AST 32  ALT 20  ALKPHOS 92  BILITOT 0.7   ------------------------------------------------------------------------------------------------------------------  Cardiac Enzymes Recent Labs  Lab 02/17/18 1607  TROPONINI <0.03   ------------------------------------------------------------------------------------------------------------------  RADIOLOGY:  Ct Head Wo Contrast  Result Date: 02/17/2018 CLINICAL DATA:  Intermittent confusion over the last 1 month. EXAM: CT HEAD WITHOUT CONTRAST TECHNIQUE: Contiguous axial images were obtained from the base of the skull through the vertex without intravenous contrast. COMPARISON:  None. FINDINGS: Brain: Mild generalized atrophy and white matter disease are present. Scattered hypoattenuation is present in the basal ganglia, likely remote. A  CSF density lacunar infarct is present in the left lentiform nucleus. The brainstem is within normal limits. A remote lacunar infarct is present in the medial right cerebellum on image 7. No acute cortical infarct, hemorrhage, or mass lesion is present. Vascular: Atherosclerotic calcifications are present within the cavernous internal carotid arteries bilaterally. There is no hyperdense vessel. Skull: Calvarium is intact. No focal lytic or blastic lesion is present. Sinuses/Orbits: The paranasal sinuses and mastoid air cells are clear. Bilateral lens replacements are present. Globes and orbits are otherwise within normal limits. IMPRESSION: 1. No acute or focal lesion to explain the patient's intermittent confusion. 2. Generalized atrophy and white matter disease likely reflects the sequela of chronic microvascular ischemia. 3. Lacunar infarcts of the basal ganglia bilaterally appear remote. 4. Atherosclerosis. Electronically Signed   By: San Morelle M.D.   On: 02/17/2018 17:25    EKG:   Orders placed or performed during the hospital encounter of 02/17/18  . ED EKG  . ED EKG  . EKG 12-Lead  . EKG 12-Lead  . EKG 12-Lead  . EKG 12-Lead    IMPRESSION AND PLAN:     #Delirium from acute kidney injury and UTI Admit to MedSurg unit Hydrate with IV fluids IV levofloxacin for UTI as patient is allergic to penicillin-anaphylaxis Neurochecks CT head negative  #Acute kidney injury  on chronic kidney disease stage IV Avoid nephrotoxins Holding Hyzaar and allopurinol Hydrate with IV fluids Renal ultrasound if no improvement Renal dose rest of the medications  #UTI Get urine culture and sensitivity and IV levofloxacin  #Dizziness from dehydration Hydrate with IV fluids and check orthostatics  #Essential hypertension Continue hydralazine and Toprol-XL hold Hyzaar  #Hyperlipidemia continue home medication Zocor  #Gout no exacerbation at this time Hold allopurinol  DVT prophylaxis  Lovenox subcu renal dose adjusted GI prophylaxis with Protonix  All the records are reviewed and case discussed with ED provider. Management plans discussed with the patient, family and they are in agreement.  CODE STATUS: DNR   TOTAL TIME TAKING CARE OF THIS PATIENT: 43  minutes.   Note: This dictation was prepared with Dragon dictation along with smaller phrase technology. Any transcriptional errors that result from this process are unintentional.  Nicholes Mango M.D on 02/17/2018 at 7:05 PM  Between 7am to 6pm - Pager - [phone]  After 6pm go to [email or web address] - password [email or web address] [facility]  [facility]  Office  [phone]  CC: Primary care physician; Derinda Late, MD

## 2018-02-17 NOTE — ED Notes (Signed)
Pt given po fluids at this time

## 2018-02-17 NOTE — ED Notes (Signed)
Admitting provider bedside 

## 2018-02-18 DIAGNOSIS — R410 Disorientation, unspecified: Secondary | ICD-10-CM

## 2018-02-18 LAB — CBC
HEMATOCRIT: 31.9 % — AB (ref 36.0–46.0)
HEMOGLOBIN: 10.9 g/dL — AB (ref 12.0–15.0)
MCH: 32.3 pg (ref 26.0–34.0)
MCHC: 34.2 g/dL (ref 30.0–36.0)
MCV: 94.7 fL (ref 80.0–100.0)
Platelets: 138 10*3/uL — ABNORMAL LOW (ref 150–400)
RBC: 3.37 MIL/uL — ABNORMAL LOW (ref 3.87–5.11)
RDW: 13.1 % (ref 11.5–15.5)
WBC: 8.2 10*3/uL (ref 4.0–10.5)
nRBC: 0 % (ref 0.0–0.2)

## 2018-02-18 LAB — COMPREHENSIVE METABOLIC PANEL
ALBUMIN: 3.9 g/dL (ref 3.5–5.0)
ALK PHOS: 78 U/L (ref 38–126)
ALT: 19 U/L (ref 0–44)
AST: 35 U/L (ref 15–41)
Anion gap: 6 (ref 5–15)
BILIRUBIN TOTAL: 0.7 mg/dL (ref 0.3–1.2)
BUN: 62 mg/dL — AB (ref 8–23)
CALCIUM: 8.6 mg/dL — AB (ref 8.9–10.3)
CO2: 20 mmol/L — ABNORMAL LOW (ref 22–32)
CREATININE: 2.42 mg/dL — AB (ref 0.44–1.00)
Chloride: 111 mmol/L (ref 98–111)
GFR calc Af Amer: 19 mL/min — ABNORMAL LOW (ref >60)
GFR calc non Af Amer: 17 mL/min — ABNORMAL LOW (ref >60)
Glucose, Bld: 88 mg/dL (ref 70–99)
Potassium: 4.5 mmol/L (ref 3.5–5.1)
Sodium: 137 mmol/L (ref 135–145)
TOTAL PROTEIN: 6.6 g/dL (ref 6.5–8.1)

## 2018-02-18 MED ORDER — HYDRALAZINE HCL 50 MG PO TABS
50.0000 mg | ORAL_TABLET | Freq: Two times a day (BID) | ORAL | Status: DC
  Administered 2018-02-18 – 2018-02-22 (×8): 50 mg via ORAL
  Filled 2018-02-18 (×8): qty 1

## 2018-02-18 MED ORDER — ADULT MULTIVITAMIN W/MINERALS CH
1.0000 | ORAL_TABLET | Freq: Every day | ORAL | Status: DC
  Administered 2018-02-18 – 2018-02-22 (×5): 1 via ORAL
  Filled 2018-02-18 (×5): qty 1

## 2018-02-18 MED ORDER — ZIPRASIDONE MESYLATE 20 MG IM SOLR
10.0000 mg | INTRAMUSCULAR | Status: AC
  Administered 2018-02-18: 10 mg via INTRAMUSCULAR
  Filled 2018-02-18: qty 20

## 2018-02-18 MED ORDER — CHLORHEXIDINE GLUCONATE CLOTH 2 % EX PADS
6.0000 | MEDICATED_PAD | Freq: Every day | CUTANEOUS | Status: DC
  Administered 2018-02-18 – 2018-02-21 (×4): 6 via TOPICAL

## 2018-02-18 MED ORDER — RISPERIDONE 0.5 MG PO TBDP: 0.2500 mg | ORAL_TABLET | Freq: Every day | ORAL | Status: DC

## 2018-02-18 MED ORDER — SODIUM CHLORIDE 0.9 % IV BOLUS
500.0000 mL | Freq: Once | INTRAVENOUS | Status: AC
  Administered 2018-02-18: 500 mL via INTRAVENOUS

## 2018-02-18 MED ORDER — QUETIAPINE FUMARATE 25 MG PO TABS
25.0000 mg | ORAL_TABLET | Freq: Two times a day (BID) | ORAL | Status: DC
  Administered 2018-02-18: 25 mg via ORAL
  Filled 2018-02-18: qty 1

## 2018-02-18 MED ORDER — HALOPERIDOL LACTATE 5 MG/ML IJ SOLN
1.0000 mg | Freq: Four times a day (QID) | INTRAMUSCULAR | Status: DC | PRN
  Administered 2018-02-18 – 2018-02-19 (×3): 1 mg via INTRAVENOUS
  Filled 2018-02-18 (×4): qty 0.2

## 2018-02-18 MED ORDER — SODIUM CHLORIDE 0.9 % IV SOLN
1.0000 g | INTRAVENOUS | Status: DC
  Administered 2018-02-19 – 2018-02-21 (×3): 1 g via INTRAVENOUS
  Filled 2018-02-18 (×3): qty 1

## 2018-02-18 MED ORDER — GLUCERNA SHAKE PO LIQD
237.0000 mL | Freq: Three times a day (TID) | ORAL | Status: DC
  Administered 2018-02-18 – 2018-02-22 (×11): 237 mL via ORAL

## 2018-02-18 MED ORDER — RISPERIDONE 1 MG PO TBDP
0.5000 mg | ORAL_TABLET | Freq: Three times a day (TID) | ORAL | Status: DC
  Administered 2018-02-18 – 2018-02-20 (×4): 0.5 mg via ORAL
  Filled 2018-02-18 (×7): qty 0.5

## 2018-02-18 MED ORDER — MUPIROCIN 2 % EX OINT
1.0000 "application " | TOPICAL_OINTMENT | Freq: Two times a day (BID) | CUTANEOUS | Status: AC
  Administered 2018-02-18 – 2018-02-22 (×10): 1 via NASAL
  Filled 2018-02-18: qty 22

## 2018-02-18 NOTE — Consult Note (Signed)
[facility] Consult   Reason for Consult: Consult for 84 year old woman in the hospital with urinary tract infection who is exhibiting delirium Referring Physician: Gouru Patient Identification: DAKSHA KOONE MRN:  [id number] Principal Diagnosis: Acute delirium Diagnosis:   Patient Active Problem List   Diagnosis Date Noted  . Acute delirium [R41.0] 02/18/2018  . AKI (acute kidney injury) ([place]) [N17.9] 02/17/2018    Total Time spent with patient: 1 hour  Subjective:   HEER JUSTISS is a 84 y.o. female patient admitted with "I think it is all in your notes".  HPI: Patient seen chart reviewed.  84 year old woman brought in from [facility].  Patient is showing mental status changes apparently from her baseline.  Found to have urinary tract infection.  Admitted to the hospital.  Today the patient's "pleasantly confused" demeanor seems to have become more concerning when she told the nursing staff she was having visual hallucinations.  I found the patient awake in her room sitting up in a chair.  She engaged easily in conversation but she was very clearly confused.  She was picking at her blanket in a manner typical of delirium throughout the interview and once she was done with that she tried to start picking at all the IV tubing and electrical cords that she could reach.  Patient did not know where she was at all and could not even really confabulated an answer.  Had no idea why she was here.  Tells me that she lives in her own home by herself.  Denies any knowledge of any medical problems.  Patient denies being in any pain or being sick to her stomach or feeling sick in any way.  She could not remember 3 objects very well even initially and could not remember any after 3 minutes.  Patient did not appear to be depressed or angry.  At times mildly confused but then returned to a baseline of's pleasantly smiling.  Denied depression.  Denied any suicidal thoughts.  When asked about  hallucinations specifically the one about seeing a river the patient went off on a tangent talking about some other things that she might be seeing.  Medical history: In the hospital with urinary tract infection she has chronic renal insufficiency history of arthritis hypertension elevated cholesterol  Social history: Evidently lives at [facility].  Notes report that a niece has been present today.  Patient tells me the niece lives in [place].  Patient says she has 2 daughters 1 of whom lives in [place].  She cannot remember where the other one lives.  Notes reports that 1 of them is the power of attorney.  Patient tells me her husband just died 3 months ago I suspect she may have the timing wrong.  Substance abuse history: Denies alcohol abuse there is nothing in the chart about any past substance abuse issues  Past Psychiatric History: Patient was on citalopram and bupropion recently.  These seem to have been chronic medications from her outpatient provider.  Not clear exactly what the original indication was.  The patient does not report any depression and there is nothing in the old chart that I can find about depression or anxiety being an ongoing problem.  No known history of psychiatric hospitalization.  Risk to Self:   Risk to Others:   Prior Inpatient Therapy:   Prior Outpatient Therapy:    Past Medical History:  Past Medical History:  Diagnosis Date  . Arthritis    Gout  .  Hypercholesteremia   . Hypertension   . Neuropathy    feet and lower legs  . Seasonal allergies   . Skin cancer    face    Past Surgical History:  Procedure Laterality Date  . ABDOMINAL HYSTERECTOMY  2000  . APPENDECTOMY    . CATARACT EXTRACTION Left   . CATARACT EXTRACTION W/PHACO Right 10/24/2014   Procedure: CATARACT EXTRACTION PHACO AND INTRAOCULAR LENS PLACEMENT (IOC);  Surgeon: Leandrew Koyanagi, MD;  Location: [facility];  Service: Ophthalmology;  Laterality: Right;   Family  History: History reviewed. No pertinent family history. Family Psychiatric  History: None known Social History:  Social History   Substance and Sexual Activity  Alcohol Use No     Social History   Substance and Sexual Activity  Drug Use Not on file    Social History   Socioeconomic History  . Marital status: Widowed    Spouse name: Not on file  . Number of children: Not on file  . Years of education: Not on file  . Highest education level: Not on file  Occupational History  . Not on file  Social Needs  . Financial resource strain: Not on file  . Food insecurity:    Worry: Not on file    Inability: Not on file  . Transportation needs:    Medical: Not on file    Non-medical: Not on file  Tobacco Use  . Smoking status: Never Smoker  . Smokeless tobacco: Never Used  Substance and Sexual Activity  . Alcohol use: No  . Drug use: Not on file  . Sexual activity: Not on file  Lifestyle  . Physical activity:    Days per week: Not on file    Minutes per session: Not on file  . Stress: Not on file  Relationships  . Social connections:    Talks on phone: Not on file    Gets together: Not on file    Attends religious service: Not on file    Active member of club or organization: Not on file    Attends meetings of clubs or organizations: Not on file    Relationship status: Not on file  Other Topics Concern  . Not on file  Social History Narrative  . Not on file   Additional Social History:    Allergies:   Allergies  Allergen Reactions  . Penicillins Anaphylaxis    Labs:  Results for orders placed or performed during the hospital encounter of 02/17/18 (from the past 48 hour(s))  Comprehensive metabolic panel     Status: Abnormal   Collection Time: 02/17/18  4:07 PM  Result Value Ref Range   Sodium 136 135 - 145 mmol/L   Potassium 5.0 3.5 - 5.1 mmol/L   Chloride 105 98 - 111 mmol/L   CO2 20 (L) 22 - 32 mmol/L   Glucose, Bld 117 (H) 70 - 99 mg/dL   BUN 70 (H)  8 - 23 mg/dL   Creatinine, Ser 3.20 (H) 0.44 - 1.00 mg/dL   Calcium 9.5 8.9 - 10.3 mg/dL   Total Protein 7.8 6.5 - 8.1 g/dL   Albumin 4.8 3.5 - 5.0 g/dL   AST 32 15 - 41 U/L   ALT 20 0 - 44 U/L   Alkaline Phosphatase 92 38 - 126 U/L   Total Bilirubin 0.7 0.3 - 1.2 mg/dL   GFR calc non Af Amer 12 (L) >60 mL/min   GFR calc Af Amer 14 (L) >60 mL/min  Comment: (NOTE) The eGFR has been calculated using the CKD EPI equation. This calculation has not been validated in all clinical situations. eGFR's persistently <60 mL/min signify possible Chronic Kidney Disease.    Anion gap 11 5 - 15    Comment: Performed at [facility], [address] [place], [place] [address]  CBC     Status: Abnormal   Collection Time: 02/17/18  4:07 PM  Result Value Ref Range   WBC 8.9 4.0 - 10.5 K/uL   RBC 3.81 (L) 3.87 - 5.11 MIL/uL   Hemoglobin 12.1 12.0 - 15.0 g/dL   HCT 35.7 (L) 36.0 - 46.0 %   MCV 93.7 80.0 - 100.0 fL   MCH 31.8 26.0 - 34.0 pg   MCHC 33.9 30.0 - 36.0 g/dL   RDW 13.2 11.5 - 15.5 %   Platelets 165 150 - 400 K/uL   nRBC 0.0 0.0 - 0.2 %    Comment: Performed at [facility], [address] [place], [place] [address]  Troponin I     Status: None   Collection Time: 02/17/18  4:07 PM  Result Value Ref Range   Troponin I <0.03 <0.03 ng/mL    Comment: Performed at [facility], [address] [place], [place] [address]  Urinalysis, Complete w Microscopic     Status: Abnormal   Collection Time: 02/17/18  4:29 PM  Result Value Ref Range   Color, Urine YELLOW (A) YELLOW   APPearance CLOUDY (A) CLEAR   Specific Gravity, Urine 1.013 1.005 - 1.030   pH 5.0 5.0 - 8.0   Glucose, UA NEGATIVE NEGATIVE mg/dL   Hgb urine dipstick NEGATIVE NEGATIVE   Bilirubin Urine NEGATIVE NEGATIVE   Ketones, ur NEGATIVE NEGATIVE mg/dL   Protein, ur NEGATIVE NEGATIVE mg/dL   Nitrite NEGATIVE NEGATIVE   Leukocytes, UA MODERATE (A) NEGATIVE   RBC / HPF 0-5 0 - 5 RBC/hpf   WBC,  UA >50 (H) 0 - 5 WBC/hpf   Bacteria, UA NONE SEEN NONE SEEN   Squamous Epithelial / LPF 0-5 0 - 5   Mucus PRESENT    Hyaline Casts, UA PRESENT    Granular Casts, UA PRESENT     Comment: Performed at [facility], [address] [place], [place] [address]  MRSA PCR Screening     Status: Abnormal   Collection Time: 02/17/18  8:13 PM  Result Value Ref Range   MRSA by PCR POSITIVE (A) NEGATIVE    Comment:        The GeneXpert MRSA Assay (FDA approved for NASAL specimens only), is one component of a comprehensive MRSA colonization surveillance program. It is not intended to diagnose MRSA infection nor to guide or monitor treatment for MRSA infections. RESULT CALLED TO, READ BACK BY AND VERIFIED WITH: C/YASMIN FUENTES _0  02/17/18 [facility] Performed at [facility], [address] [place], [place] [address]   CBC     Status: Abnormal   Collection Time: 02/18/18  6:12 AM  Result Value Ref Range   WBC 8.2 4.0 - 10.5 K/uL   RBC 3.37 (L) 3.87 - 5.11 MIL/uL   Hemoglobin 10.9 (L) 12.0 - 15.0 g/dL   HCT 31.9 (L) 36.0 - 46.0 %   MCV 94.7 80.0 - 100.0 fL   MCH 32.3 26.0 - 34.0 pg   MCHC 34.2 30.0 - 36.0 g/dL   RDW 13.1 11.5 - 15.5 %   Platelets 138 (L) 150 - 400 K/uL   nRBC 0.0  0.0 - 0.2 %    Comment: Performed at [facility], [address] [place], [place] [address]  Comprehensive metabolic panel     Status: Abnormal   Collection Time: 02/18/18  6:12 AM  Result Value Ref Range   Sodium 137 135 - 145 mmol/L   Potassium 4.5 3.5 - 5.1 mmol/L   Chloride 111 98 - 111 mmol/L   CO2 20 (L) 22 - 32 mmol/L   Glucose, Bld 88 70 - 99 mg/dL   BUN 62 (H) 8 - 23 mg/dL   Creatinine, Ser 2.42 (H) 0.44 - 1.00 mg/dL   Calcium 8.6 (L) 8.9 - 10.3 mg/dL   Total Protein 6.6 6.5 - 8.1 g/dL   Albumin 3.9 3.5 - 5.0 g/dL   AST 35 15 - 41 U/L   ALT 19 0 - 44 U/L   Alkaline Phosphatase 78 38 - 126 U/L   Total Bilirubin 0.7 0.3 - 1.2 mg/dL   GFR calc non Af Amer 17 (L) >60  mL/min   GFR calc Af Amer 19 (L) >60 mL/min    Comment: (NOTE) The eGFR has been calculated using the CKD EPI equation. This calculation has not been validated in all clinical situations. eGFR's persistently <60 mL/min signify possible Chronic Kidney Disease.    Anion gap 6 5 - 15    Comment: Performed at [facility], [address] [place], [place] [address]    Current Facility-Administered Medications  Medication Dose Route Frequency Provider Last Rate Last Dose  . 0.9 %  sodium chloride infusion   Intravenous Continuous Nicholes Mango, MD   Stopped at 02/18/18 1509  . acetaminophen (TYLENOL) tablet 500 mg  500 mg Oral Q6H PRN Gouru, Aruna, MD   500 mg at 02/18/18 0352  . buPROPion (WELLBUTRIN XL) 24 hr tablet 150 mg  150 mg Oral Daily Gouru, Aruna, MD   150 mg at 02/18/18 0935  . [START ON 02/19/2018] cefTRIAXone (ROCEPHIN) 1 g in sodium chloride 0.9 % 100 mL IVPB  1 g Intravenous Q24H Dustin Flock, MD      . Chlorhexidine Gluconate Cloth 2 % PADS 6 each  6 each Topical Q0600 Fritzi Mandes, MD   6 each at 02/18/18 0400  . citalopram (CELEXA) tablet 20 mg  20 mg Oral Daily Gouru, Aruna, MD   20 mg at 02/18/18 0935  . docusate sodium (COLACE) capsule 100 mg  100 mg Oral BID Gouru, Aruna, MD   100 mg at 02/18/18 0935  . enoxaparin (LOVENOX) injection 30 mg  30 mg Subcutaneous Q24H Gouru, Aruna, MD   30 mg at 02/17/18 2156  . famotidine (PEPCID) tablet 20 mg  20 mg Oral QHS Gouru, Illene Silver, MD   20 mg at 02/17/18 2155  . feeding supplement (GLUCERNA SHAKE) (GLUCERNA SHAKE) liquid 237 mL  237 mL Oral TID BM Fritzi Mandes, MD   237 mL at 02/18/18 1417  . gabapentin (NEURONTIN) capsule 300 mg  300 mg Oral QHS Gouru, Aruna, MD   300 mg at 02/17/18 2155  . haloperidol lactate (HALDOL) injection 1 mg  1 mg Intravenous Q6H PRN Clapacs, John T, MD      . hydrALAZINE (APRESOLINE) tablet 50 mg  50 mg Oral BID Dustin Flock, MD      . loratadine (CLARITIN) tablet 10 mg  10 mg Oral Daily PRN  Gouru, Aruna, MD      . metoprolol succinate (TOPROL-XL) 24 hr tablet 37.5 mg  37.5 mg Oral QHS Gouru,  Illene Silver, MD   37.5 mg at 02/17/18 2155  . multivitamin with minerals tablet 1 tablet  1 tablet Oral Daily Fritzi Mandes, MD   1 tablet at 02/18/18 1417  . mupirocin ointment (BACTROBAN) 2 % 1 application  1 application Nasal BID Fritzi Mandes, MD   1 application at 00/86/76 0935  . ondansetron (ZOFRAN) tablet 4 mg  4 mg Oral Q6H PRN Gouru, Aruna, MD       Or  . ondansetron (ZOFRAN) injection 4 mg  4 mg Intravenous Q6H PRN Gouru, Aruna, MD      . pantoprazole (PROTONIX) EC tablet 40 mg  40 mg Oral QAC breakfast Gouru, Aruna, MD   40 mg at 02/18/18 0800  . simvastatin (ZOCOR) tablet 20 mg  20 mg Oral QPM Gouru, Aruna, MD   20 mg at 02/17/18 2352    Musculoskeletal: Strength & Muscle Tone: within normal limits Gait & Station: unable to stand Patient leans: N/A  Psychiatric Specialty Exam: Physical Exam  Nursing note and vitals reviewed. Constitutional: She appears well-developed and well-nourished.  HENT:  Head: Normocephalic and atraumatic.  Eyes: Pupils are equal, round, and reactive to light. Conjunctivae are normal.  Neck: Normal range of motion.  Cardiovascular: Regular rhythm and normal heart sounds.  Respiratory: Effort normal.  GI: Soft.  Musculoskeletal: Normal range of motion.  Neurological: She is alert.  Skin: Skin is warm and dry.  Psychiatric: She has a normal mood and affect. Her speech is delayed and tangential. She is actively hallucinating. She is not agitated and not aggressive. Thought content is delusional. Thought content is not paranoid. Cognition and memory are impaired. She expresses impulsivity and inappropriate judgment. She expresses no homicidal and no suicidal ideation. She exhibits abnormal recent memory and abnormal remote memory.    Review of Systems  Constitutional: Negative.   HENT: Negative.   Eyes: Negative.   Respiratory: Negative.   Cardiovascular:  Negative.   Gastrointestinal: Negative.   Musculoskeletal: Negative.   Skin: Negative.   Neurological: Negative.   Psychiatric/Behavioral: Positive for hallucinations and memory loss. Negative for depression, substance abuse and suicidal ideas. The patient is not nervous/anxious and does not have insomnia.     Blood pressure (!) 188/71, pulse 63, temperature 97.8 F (36.6 C), temperature source Oral, resp. rate 16, height 5' 4" (1.626 m), weight 66.2 kg, SpO2 100 %.Body mass index is 25.06 kg/m.  General Appearance: Fairly Groomed  Eye Contact:  Fair  Speech:  Slow  Volume:  Decreased  Mood:  Euthymic  Affect:  Constricted  Thought Process:  Disorganized  Orientation:  Negative  Thought Content:  Illogical, Rumination and Tangential  Suicidal Thoughts:  No  Homicidal Thoughts:  No  Memory:  Immediate;   Fair Recent;   Poor Remote;   Poor  Judgement:  Impaired  Insight:  Lacking  Psychomotor Activity:  Restlessness  Concentration:  Concentration: Poor  Recall:  Poor  Fund of Knowledge:  Poor  Language:  Good  Akathisia:  No  Handed:  Right  AIMS (if indicated):     Assets:  Desire for Improvement Housing Social Support  ADL's:  Impaired  Cognition:  Impaired,  Moderate  Sleep:        Treatment Plan Summary: Daily contact with patient to assess and evaluate symptoms and progress in treatment, Medication management and Plan 84 year old woman with urinary tract infection who is currently delirious.  She is awake and interactive but completely confused.  Fortunately she has not been aggressive  she has not been pulling out her IV does not appear that she has been doing anything dangerous.  I see that an order had been placed to start her on Seroquel.  I am going to discontinue that because I think with the current level of symptoms adding medication on a standing basis may do more harm than good.  I will make sure there is a order for intravenous Haldol 1 mg every 6 hours as  needed for agitation which could potentially be very helpful if she is "sundowning".  Patient has multiple reasons to be delirious including her urinary tract infection, renal insufficiency, metabolic abnormalities and also possibly the fact that just a few days ago an outpatient provider started her on scopolamine patches for dizziness.  I am glad to see that those have not been continued as that kind of medicine could contribute to delirium.  That is another reason to hold off on the standing Seroquel as it does have some anticholinergic effect.  I will sign this out to the psychiatrist on call over the weekend.  I would hope that what ever is causing her delirium would resolve once her health is improving and she is able to be discharged back to her regular environment.  Disposition: No evidence of imminent risk to self or others at present.   Patient does not meet criteria for psychiatric inpatient admission. Supportive therapy provided about ongoing stressors. Discussed crisis plan, support from social network, calling 911, coming to the Emergency Department, and calling Suicide Hotline.  Alethia Berthold, MD 02/18/2018 3:57 PM

## 2018-02-18 NOTE — Progress Notes (Signed)
Patient having hallucinations stating that there is a river in her room and seeing people in the room who are not present. Notified Dr. Posey Pronto and order for medication placed to treat symptoms. Medication has been given and will continue to monitor patient.

## 2018-02-18 NOTE — Progress Notes (Addendum)
Initial Nutrition Assessment  DOCUMENTATION CODES:   Not applicable  INTERVENTION:   -Glucerna Shake po TID, each supplement provides 220 kcal and 10 grams of protein -MVI with minerals daily  NUTRITION DIAGNOSIS:   Predicted suboptimal nutrient intake related to social / environmental circumstances as evidenced by per patient/family report.  GOAL:   Patient will meet greater than or equal to 90% of their needs  MONITOR:   PO intake, Supplement acceptance, Labs, Weight trends, Skin, I & O's  REASON FOR ASSESSMENT:   Consult Diet education, Calorie Count  ASSESSMENT:   Tina Curtis  is a 84 y.o. female with a known history of chronic kidney disease stage IV, hypertension hyperlipidemia, seasonal allergies and other medical problems is brought in from [facility] with altered mental status.  Patient is pleasantly confused.  Urinalysis is abnormal and baseline creatinine at 2 today it is at 3.2.  Patient's niece at bedside is reporting that patient has been lately feeling nauseous and not eating or drinking well.  Lips are dry.  Hospitalist team is called to admit the patient.  Patient was given IV antibiotics after urine culture and fluid boluses in the ED.  Pt admitted with delirium from AKI and UTI.  Pt resided at [facility].   Pt was pleasant but confused at time of visit (pt would easily get off topic during the interview- believing there were other people in the room other than who was present). Two neighbors at bedside assisted with history. Pt reports a decreased appetite since her husband passed away about 5 months ago; her neighbors have been assisting her with food preparation and meals due to concern that she is not cooking and eating as she should. Per neighbors, pt is a very healthful eater and was very diligent with her food choices as pt husband had brittle diabetes. Pt reports she still consumes 3 meals per day- Breakfast: oatmeal or cold cereal;  Lunch: sandwich or peanut butter crackers or yogurt; Dinner: meat, starch, and vegetable.   Unsure how much pt ate today and neighbors were unsure of breakfast intake.   Pt reports recent wt loss, but "I gained it all back and more". Per reviewed of wt hx from [facility], pt has been stable over the past several years. Pt neighbor is concerned that pt was admitted with dehydration. She also reported that during a recent PCP visit her blood sugar was higher than usual and she was very concerned about that; neighbor believes this is related to pt not eating as healthfully as usual. Per PCP visit, Hgb A1c: 5.9 (02/08/18).   Pt not appropriate for education at this time related to confusion.   Suspect muscle depletions are related to advanced age.   Labs reviewed.   NUTRITION - FOCUSED PHYSICAL EXAM:    Most Recent Value  Orbital Region  No depletion  Upper Arm Region  Mild depletion  Thoracic and Lumbar Region  No depletion  Buccal Region  No depletion  Temple Region  Mild depletion  Clavicle Bone Region  No depletion  Clavicle and Acromion Bone Region  No depletion  Scapular Bone Region  No depletion  Dorsal Hand  Mild depletion  Patellar Region  Mild depletion  Anterior Thigh Region  Mild depletion  Posterior Calf Region  Mild depletion  Edema (RD Assessment)  None  Hair  Reviewed  Eyes  Reviewed  Mouth  Reviewed  Skin  Reviewed  Nails  Reviewed  Diet Order:   Diet Order            Diet Heart Room service appropriate? Yes; Fluid consistency: Thin  Diet effective now              EDUCATION NEEDS:   Not appropriate for education at this time  Skin:  Skin Assessment: Reviewed RN Assessment  Last BM:  02/18/18  Height:   Ht Readings from Last 1 Encounters:  02/17/18 5\' 4"  (1.626 m)    Weight:   Wt Readings from Last 1 Encounters:  02/17/18 66.2 kg    Ideal Body Weight:  54.5 kg  BMI:  Body mass index is 25.06 kg/m.  Estimated Nutritional  Needs:   Kcal:  1600-1800  Protein:  70-85 grams  Fluid:  1.6-1.8 L    Glendi Mohiuddin A. Jimmye Norman, RD, LDN, CDE Pager: [phone] After hours Pager: [phone]

## 2018-02-18 NOTE — Progress Notes (Signed)
Patient continued to have hallucinations and became combative with staff and pulled IV tubing out of pump damaging her IV tubing and occluding IV site. around 1600, providers were notified and ordered medications to help calm patient, she has since administration of Geodon calmed down.

## 2018-02-18 NOTE — Consult Note (Signed)
  Psychiatry: Shortly after my previous consult note I was called by the ward because the patient had escalated.  Apparently the patient became agitated got up out of her chair was actively hallucinating got herself wrapped up in her IV cord yanked on her IV.  Staff was going to try to give her intravenous Haldol but the IV was compromised.  Hospitalist recommended 10 mg of intramuscular Geodon which is exactly what I would have said.  Patient obviously is prone to agitation more than it had appeared previously.  I have put back in place orders for Risperdal oral dissolving tablets 3 times a day standing which may help to minimize the need for the Haldol or Geodon.  I will sign this out to Dr. on call

## 2018-02-18 NOTE — Progress Notes (Signed)
Pt has received new IV assess per IV team. IV has been removed since 1600.  Family concerned that pt is not receiving IV fluids.Pt also had orders for NS at 125 that expired at 2100.  Primary nurse paged and spoke to Dr. Duane Boston. Orders received for 500 ML NS @ 125. Primary nurse to continue to monitor.

## 2018-02-18 NOTE — Progress Notes (Addendum)
[place] [facility]                                                                                                                                                                                  Patient Demographics   Tina Curtis, is a 84 y.o. female, [name] - 1926/10/26, NKN:[id number]  Admit date - 02/17/2018   Admitting Physician Nicholes Mango, MD  Outpatient Primary MD for the patient is Derinda Late, MD   LOS - 1  Subjective: Patient confused and delirious According to the neighbor who is visiting the patient she is normally not like this. Patient having visual and auditory hallucinations  Review of Systems:   CONSTITUTIONAL: Confused  Vitals:   Vitals:   02/18/18 0457 02/18/18 0935 02/18/18 0935 02/18/18 1205  BP: (!) 149/69 (!) 147/81 (!) 147/81 (!) 188/71  Pulse: 66  65 63  Resp: 20   16  Temp: 98.1 F (36.7 C)   97.8 F (36.6 C)  TempSrc: Oral   Oral  SpO2: 96%   100%  Weight:      Height:        Wt Readings from Last 3 Encounters:  02/17/18 66.2 kg  10/24/14 65.3 kg     Intake/Output Summary (Last 24 hours) at 02/18/2018 1432 Last data filed at 02/18/2018 1400 Gross per 24 hour  Intake 1528.21 ml  Output 500 ml  Net 1028.21 ml    Physical Exam:   GENERAL: Pleasant-appearing in no apparent distress.  Few HEAD, EYES, EARS, NOSE AND THROAT: Atraumatic, normocephalic. Extraocular muscles are intact. Pupils equal and reactive to light. Sclerae anicteric. No conjunctival injection. No oro-pharyngeal erythema.  NECK: Supple. There is no jugular venous distention. No bruits, no lymphadenopathy, no thyromegaly.  HEART: Regular rate and rhythm,. No murmurs, no rubs, no clicks.  LUNGS: Clear to auscultation bilaterally. No rales or rhonchi. No wheezes.  ABDOMEN: Soft, flat, nontender, nondistended. Has good bowel sounds. No hepatosplenomegaly appreciated.  EXTREMITIES: No evidence of any cyanosis, clubbing, or peripheral edema.   +2 pedal and radial pulses bilaterally.  NEUROLOGIC: The patient is alert, not oriented  SKIN: Moist and warm with no rashes appreciated.  Psych: Not anxious, depressed LN: No inguinal LN enlargement    Antibiotics   Anti-infectives (From admission, onward)   Start     Dose/Rate Route Frequency Ordered Stop   02/19/18 2200  levofloxacin (LEVAQUIN) IVPB 500 mg  Status:  Discontinued     500 mg 100 mL/hr over 60 Minutes Intravenous Every 48 hours 02/17/18 1904 02/18/18 1313   02/19/18 1000  cefTRIAXone (ROCEPHIN) 1 g in sodium chloride 0.9 % 100 mL IVPB  1 g 200 mL/hr over 30 Minutes Intravenous Every 24 hours 02/18/18 1313     02/17/18 2200  levofloxacin (LEVAQUIN) IVPB 750 mg     750 mg 100 mL/hr over 90 Minutes Intravenous  Once 02/17/18 1904 02/17/18 2340   02/17/18 1900  levofloxacin (LEVAQUIN) IVPB 500 mg  Status:  Discontinued     500 mg 100 mL/hr over 60 Minutes Intravenous Every 24 hours 02/17/18 1845 02/17/18 1904   02/17/18 1845  cefTRIAXone (ROCEPHIN) 1 g in sodium chloride 0.9 % 100 mL IVPB     1 g 200 mL/hr over 30 Minutes Intravenous  Once 02/17/18 1838 02/17/18 1953      Medications   Scheduled Meds: . buPROPion  150 mg Oral Daily  . Chlorhexidine Gluconate Cloth  6 each Topical Q0600  . citalopram  20 mg Oral Daily  . docusate sodium  100 mg Oral BID  . enoxaparin (LOVENOX) injection  30 mg Subcutaneous Q24H  . famotidine  20 mg Oral QHS  . feeding supplement (GLUCERNA SHAKE)  237 mL Oral TID BM  . gabapentin  300 mg Oral QHS  . hydrALAZINE  50 mg Oral BID  . metoprolol succinate  37.5 mg Oral QHS  . multivitamin with minerals  1 tablet Oral Daily  . mupirocin ointment  1 application Nasal BID  . pantoprazole  40 mg Oral QAC breakfast  . QUEtiapine  25 mg Oral BID  . simvastatin  20 mg Oral QPM   Continuous Infusions: . sodium chloride 125 mL/hr at 02/18/18 0646  . [START ON 02/19/2018] cefTRIAXone (ROCEPHIN)  IV     PRN Meds:.acetaminophen,  loratadine, ondansetron **OR** ondansetron (ZOFRAN) IV   Data Review:   Micro Results Recent Results (from the past 240 hour(s))  MRSA PCR Screening     Status: Abnormal   Collection Time: 02/17/18  8:13 PM  Result Value Ref Range Status   MRSA by PCR POSITIVE (A) NEGATIVE Final    Comment:        The GeneXpert MRSA Assay (FDA approved for NASAL specimens only), is one component of a comprehensive MRSA colonization surveillance program. It is not intended to diagnose MRSA infection nor to guide or monitor treatment for MRSA infections. RESULT CALLED TO, READ BACK BY AND VERIFIED WITH: C/YASMIN FUENTES @2240  02/17/18 [facility] Performed at [facility], [address], [place], [place] [address]     Radiology Reports Ct Head Wo Contrast  Result Date: 02/17/2018 CLINICAL DATA:  Intermittent confusion over the last 1 month. EXAM: CT HEAD WITHOUT CONTRAST TECHNIQUE: Contiguous axial images were obtained from the base of the skull through the vertex without intravenous contrast. COMPARISON:  None. FINDINGS: Brain: Mild generalized atrophy and white matter disease are present. Scattered hypoattenuation is present in the basal ganglia, likely remote. A CSF density lacunar infarct is present in the left lentiform nucleus. The brainstem is within normal limits. A remote lacunar infarct is present in the medial right cerebellum on image 7. No acute cortical infarct, hemorrhage, or mass lesion is present. Vascular: Atherosclerotic calcifications are present within the cavernous internal carotid arteries bilaterally. There is no hyperdense vessel. Skull: Calvarium is intact. No focal lytic or blastic lesion is present. Sinuses/Orbits: The paranasal sinuses and mastoid air cells are clear. Bilateral lens replacements are present. Globes and orbits are otherwise within normal limits. IMPRESSION: 1. No acute or focal lesion to explain the patient's intermittent confusion. 2. Generalized atrophy  and white matter disease likely reflects the sequela  of chronic microvascular ischemia. 3. Lacunar infarcts of the basal ganglia bilaterally appear remote. 4. Atherosclerosis. Electronically Signed   By: San Morelle M.D.   On: 02/17/2018 17:25     CBC Recent Labs  Lab 02/17/18 1607 02/18/18 0612  WBC 8.9 8.2  HGB 12.1 10.9*  HCT 35.7* 31.9*  PLT 165 138*  MCV 93.7 94.7  MCH 31.8 32.3  MCHC 33.9 34.2  RDW 13.2 13.1    Chemistries  Recent Labs  Lab 02/17/18 1607 02/18/18 0612  NA 136 137  K 5.0 4.5  CL 105 111  CO2 20* 20*  GLUCOSE 117* 88  BUN 70* 62*  CREATININE 3.20* 2.42*  CALCIUM 9.5 8.6*  AST 32 35  ALT 20 19  ALKPHOS 92 78  BILITOT 0.7 0.7   ------------------------------------------------------------------------------------------------------------------ estimated creatinine clearance is 15 mL/min (A) (by C-G formula based on SCr of 2.42 mg/dL (H)). ------------------------------------------------------------------------------------------------------------------ No results for input(s): HGBA1C in the last 72 hours. ------------------------------------------------------------------------------------------------------------------ No results for input(s): CHOL, HDL, LDLCALC, TRIG, CHOLHDL, LDLDIRECT in the last 72 hours. ------------------------------------------------------------------------------------------------------------------ No results for input(s): TSH, T4TOTAL, T3FREE, THYROIDAB in the last 72 hours.  Invalid input(s): FREET3 ------------------------------------------------------------------------------------------------------------------ No results for input(s): VITAMINB12, FOLATE, FERRITIN, TIBC, IRON, RETICCTPCT in the last 72 hours.  Coagulation profile No results for input(s): INR, PROTIME in the last 168 hours.  No results for input(s): DDIMER in the last 72 hours.  Cardiac Enzymes Recent Labs  Lab 02/17/18 1607  TROPONINI <0.03    ------------------------------------------------------------------------------------------------------------------ Invalid input(s): POCBNP    Assessment & Plan   #Delirium from acute kidney injury and UTI Admit to MedSurg unit Hydrate with IV fluids Change antibiotics IV ceftriaxone CT head negative We will ask psychiatry to see patient is very agitated we will try some Seroquel   #Acute kidney injury on chronic kidney disease stage IV Avoid nephrotoxins Holding Hyzaar and allopurinol Continue IV fluids Renal dose rest of the medications  #UTI Get urine culture and sensitivity and IV levofloxacin  #Dizziness from dehydration Continue IV hydration  #Essential hypertension Continue hydralazine and Toprol-XL hold Hyzaar  #Hyperlipidemia continue home medication Zocor  #Gout no exacerbation at this time Hold allopurinol  DVT prophylaxis Lovenox subcu renal dose adjusted GI prophylaxis with Protonix         Code Status Orders  (From admission, onward)         Start     Ordered   02/17/18 1858  Do not attempt resuscitation (DNR)  Continuous    Question Answer Comment  In the event of cardiac or respiratory ARREST Do not call a "code blue"   In the event of cardiac or respiratory ARREST Do not perform Intubation, CPR, defibrillation or ACLS   In the event of cardiac or respiratory ARREST Use medication by any route, position, wound care, and other measures to relive pain and suffering. May use oxygen, suction and manual treatment of airway obstruction as needed for comfort.   Comments RN MAY PRONOUNCE      02/17/18 1857        Code Status History    This patient has a current code status but no historical code status.    Advance Directive Documentation     Most Recent Value  Type of Advance Directive  Healthcare Power of Attorney, Out of facility DNR (pink MOST or yellow form)  Pre-existing out of facility DNR order (yellow form or pink MOST  form)  Yellow form placed in chart (order not valid for inpatient  use)  "MOST" Form in Place?  -           Consults none   DVT Prophylaxis  Lovenox  Lab Results  Component Value Date   PLT 138 (L) 02/18/2018     Time Spent in minutes 35 minutes greater than 50% of time spent in care coordination and counseling patient regarding the condition and plan of care.   Dustin Flock M.D on 02/18/2018 at 2:32 PM  Between 7am to 6pm - Pager - [phone]  After 6pm go to [email or web address] - Proofreader  [employer]   Office  [phone]

## 2018-02-19 DIAGNOSIS — R410 Disorientation, unspecified: Secondary | ICD-10-CM

## 2018-02-19 LAB — BASIC METABOLIC PANEL
ANION GAP: 9 (ref 5–15)
BUN: 47 mg/dL — AB (ref 8–23)
CALCIUM: 9.2 mg/dL (ref 8.9–10.3)
CO2: 19 mmol/L — ABNORMAL LOW (ref 22–32)
CREATININE: 2.08 mg/dL — AB (ref 0.44–1.00)
Chloride: 113 mmol/L — ABNORMAL HIGH (ref 98–111)
GFR calc Af Amer: 23 mL/min — ABNORMAL LOW (ref >60)
GFR, EST NON AFRICAN AMERICAN: 20 mL/min — AB (ref >60)
GLUCOSE: 89 mg/dL (ref 70–99)
Potassium: 4.3 mmol/L (ref 3.5–5.1)
Sodium: 141 mmol/L (ref 135–145)

## 2018-02-19 MED ORDER — BUPROPION HCL ER (SR) 150 MG PO TB12
150.0000 mg | ORAL_TABLET | Freq: Every day | ORAL | Status: DC
  Administered 2018-02-20 – 2018-02-22 (×3): 150 mg via ORAL
  Filled 2018-02-19 (×3): qty 1

## 2018-02-19 MED ORDER — TRAZODONE HCL 50 MG PO TABS
50.0000 mg | ORAL_TABLET | Freq: Every day | ORAL | Status: DC
  Administered 2018-02-19: 50 mg via ORAL
  Filled 2018-02-19: qty 1

## 2018-02-19 MED ORDER — SODIUM CHLORIDE 0.9 % IV SOLN
INTRAVENOUS | Status: DC
  Administered 2018-02-19 – 2018-02-21 (×5): via INTRAVENOUS

## 2018-02-19 MED ORDER — OLANZAPINE 10 MG IM SOLR
5.0000 mg | Freq: Once | INTRAMUSCULAR | Status: AC
  Administered 2018-02-19: 5 mg via INTRAMUSCULAR
  Filled 2018-02-19: qty 10

## 2018-02-19 NOTE — Consult Note (Signed)
[facility] Consult   Reason for Consult: Consult for 84 year old woman in the hospital with urinary tract infection who is exhibiting delirium Referring Physician: Gouru Patient Identification: Tina Curtis MRN:  [id number] Principal Diagnosis: Acute delirium Diagnosis:   Patient Active Problem List   Diagnosis Date Noted  . Acute delirium [R41.0] 02/18/2018  . AKI (acute kidney injury) ([place]) [N17.9] 02/17/2018    Total Time spent with patient: 20 minutes  Subjective:    Patient seen chart reviewed.  Patient continues with agitation throughout the day yesterday and was even trying to climb out of her bed several times. She was placed on Risperdal 0.5 mg p.o. TID by Dr. Weber Cooks but did not sleep much last night.  After 1 AM, she was given IM Zyprexa 5 mg and then slept through the morning until lunchtime.  She is now more alert and responsive but continues to have periods of confusion including delusional thoughts and visual hallucinations.  She has however, overall been much calmer.  Her daughter who is in the room provided collateral information indicating that at baseline, the patient has no problems with disorientation or memory and currently lives independently at [facility].  No current severe depressive symptoms endorsed and no active or passive suicidal thoughts.  She is on Wellbutrin and Celexa prior to admission indicating there is a history of depression and anxiety. Per her PCP notes, t here has been some grief recently.   Medical history: In the hospital with urinary tract infection she has chronic renal insufficiency history of arthritis hypertension elevated cholesterol  Social history: Evidently lives at [facility].  Notes report that a niece has been present today.  Patient tells me the niece lives in [place].  Patient says she has 2 daughters 1 of whom lives in [place].  She cannot remember where the other one lives.  Notes reports that 1 of them is the  power of attorney.  Patient tells me her husband just died 3 months ago I suspect she may have the timing wrong.  Substance abuse history: Denies alcohol abuse there is nothing in the chart about any past substance abuse issues  Past Psychiatric History: Patient was on citalopram and bupropion recently.  These seem to have been chronic medications from her outpatient provider.  Not clear exactly what the original indication was.  The patient does not report any depression and there is nothing in the old chart that I can find about depression or anxiety being an ongoing problem.  No known history of psychiatric hospitalization.  Risk to Self:   Risk to Others:   Prior Inpatient Therapy:   Prior Outpatient Therapy:    Past Medical History:  Past Medical History:  Diagnosis Date  . Arthritis    Gout  . Hypercholesteremia   . Hypertension   . Neuropathy    feet and lower legs  . Seasonal allergies   . Skin cancer    face    Past Surgical History:  Procedure Laterality Date  . ABDOMINAL HYSTERECTOMY  2000  . APPENDECTOMY    . CATARACT EXTRACTION Left   . CATARACT EXTRACTION W/PHACO Right 10/24/2014   Procedure: CATARACT EXTRACTION PHACO AND INTRAOCULAR LENS PLACEMENT (IOC);  Surgeon: Leandrew Koyanagi, MD;  Location: [place];  Service: Ophthalmology;  Laterality: Right;   Family History: History reviewed. No pertinent family history. Family Psychiatric  History: None known Social History:  Social History   Substance and Sexual Activity  Alcohol Use No  Social History   Substance and Sexual Activity  Drug Use Not on file    Social History   Socioeconomic History  . Marital status: Widowed    Spouse name: Not on file  . Number of children: Not on file  . Years of education: Not on file  . Highest education level: Not on file  Occupational History  . Not on file  Social Needs  . Financial resource strain: Not on file  . Food insecurity:    Worry: Not  on file    Inability: Not on file  . Transportation needs:    Medical: Not on file    Non-medical: Not on file  Tobacco Use  . Smoking status: Never Smoker  . Smokeless tobacco: Never Used  Substance and Sexual Activity  . Alcohol use: No  . Drug use: Not on file  . Sexual activity: Not on file  Lifestyle  . Physical activity:    Days per week: Not on file    Minutes per session: Not on file  . Stress: Not on file  Relationships  . Social connections:    Talks on phone: Not on file    Gets together: Not on file    Attends religious service: Not on file    Active member of club or organization: Not on file    Attends meetings of clubs or organizations: Not on file    Relationship status: Not on file  Other Topics Concern  . Not on file  Social History Narrative  . Not on file   Additional Social History:    Allergies:   Allergies  Allergen Reactions  . Penicillins Anaphylaxis    Labs:  Results for orders placed or performed during the hospital encounter of 02/17/18 (from the past 48 hour(s))  Comprehensive metabolic panel     Status: Abnormal   Collection Time: 02/17/18  4:07 PM  Result Value Ref Range   Sodium 136 135 - 145 mmol/L   Potassium 5.0 3.5 - 5.1 mmol/L   Chloride 105 98 - 111 mmol/L   CO2 20 (L) 22 - 32 mmol/L   Glucose, Bld 117 (H) 70 - 99 mg/dL   BUN 70 (H) 8 - 23 mg/dL   Creatinine, Ser 3.20 (H) 0.44 - 1.00 mg/dL   Calcium 9.5 8.9 - 10.3 mg/dL   Total Protein 7.8 6.5 - 8.1 g/dL   Albumin 4.8 3.5 - 5.0 g/dL   AST 32 15 - 41 U/L   ALT 20 0 - 44 U/L   Alkaline Phosphatase 92 38 - 126 U/L   Total Bilirubin 0.7 0.3 - 1.2 mg/dL   GFR calc non Af Amer 12 (L) >60 mL/min   GFR calc Af Amer 14 (L) >60 mL/min    Comment: (NOTE) The eGFR has been calculated using the CKD EPI equation. This calculation has not been validated in all clinical situations. eGFR's persistently <60 mL/min signify possible Chronic Kidney Disease.    Anion gap 11 5 - 15     Comment: Performed at [facility], [address] [place],  [address]  CBC     Status: Abnormal   Collection Time: 02/17/18  4:07 PM  Result Value Ref Range   WBC 8.9 4.0 - 10.5 K/uL   RBC 3.81 (L) 3.87 - 5.11 MIL/uL   Hemoglobin 12.1 12.0 - 15.0 g/dL   HCT 35.7 (L) 36.0 - 46.0 %   MCV 93.7 80.0 - 100.0 fL   MCH 31.8  26.0 - 34.0 pg   MCHC 33.9 30.0 - 36.0 g/dL   RDW 13.2 11.5 - 15.5 %   Platelets 165 150 - 400 K/uL   nRBC 0.0 0.0 - 0.2 %    Comment: Performed at [facility], [address] [place], [place] [address]  Troponin I     Status: None   Collection Time: 02/17/18  4:07 PM  Result Value Ref Range   Troponin I <0.03 <0.03 ng/mL    Comment: Performed at [facility], [address] [place], [place] [address]  Urinalysis, Complete w Microscopic     Status: Abnormal   Collection Time: 02/17/18  4:29 PM  Result Value Ref Range   Color, Urine YELLOW (A) YELLOW   APPearance CLOUDY (A) CLEAR   Specific Gravity, Urine 1.013 1.005 - 1.030   pH 5.0 5.0 - 8.0   Glucose, UA NEGATIVE NEGATIVE mg/dL   Hgb urine dipstick NEGATIVE NEGATIVE   Bilirubin Urine NEGATIVE NEGATIVE   Ketones, ur NEGATIVE NEGATIVE mg/dL   Protein, ur NEGATIVE NEGATIVE mg/dL   Nitrite NEGATIVE NEGATIVE   Leukocytes, UA MODERATE (A) NEGATIVE   RBC / HPF 0-5 0 - 5 RBC/hpf   WBC, UA >50 (H) 0 - 5 WBC/hpf   Bacteria, UA NONE SEEN NONE SEEN   Squamous Epithelial / LPF 0-5 0 - 5   Mucus PRESENT    Hyaline Casts, UA PRESENT    Granular Casts, UA PRESENT     Comment: Performed at [facility], [address] [place], [place] [address]  Urine culture     Status: Abnormal (Preliminary result)   Collection Time: 02/17/18  4:29 PM  Result Value Ref Range   Specimen Description      URINE, RANDOM Performed at [facility], [address] [place], [place] [address]    Special Requests      NONE Performed at [facility], [address], [place], [place] [address]    Culture (A)     >=100,000 COLONIES/mL ESCHERICHIA COLI SUSCEPTIBILITIES TO FOLLOW Performed at [facility] [address], [place], [place] [address]    Report Status PENDING   MRSA PCR Screening     Status: Abnormal   Collection Time: 02/17/18  8:13 PM  Result Value Ref Range   MRSA by PCR POSITIVE (A) NEGATIVE    Comment:        The GeneXpert MRSA Assay (FDA approved for NASAL specimens only), is one component of a comprehensive MRSA colonization surveillance program. It is not intended to diagnose MRSA infection nor to guide or monitor treatment for MRSA infections. RESULT CALLED TO, READ BACK BY AND VERIFIED WITH: C/YASMIN FUENTES '@2240'  02/17/18 [facility]LC Performed at [facility], [address] [place],  [address]   CBC     Status: Abnormal   Collection Time: 02/18/18  6:12 AM  Result Value Ref Range   WBC 8.2 4.0 - 10.5 K/uL   RBC 3.37 (L) 3.87 - 5.11 MIL/uL   Hemoglobin 10.9 (L) 12.0 - 15.0 g/dL   HCT 31.9 (L) 36.0 - 46.0 %   MCV 94.7 80.0 - 100.0 fL   MCH 32.3 26.0 - 34.0 pg   MCHC 34.2 30.0 - 36.0 g/dL   RDW 13.1 11.5 - 15.5 %   Platelets 138 (L) 150 - 400 K/uL   nRBC 0.0 0.0 - 0.2 %    Comment: Performed at [facility], [address],  [place], [place] [address]  Comprehensive metabolic panel     Status: Abnormal   Collection Time: 02/18/18  6:12 AM  Result Value Ref Range   Sodium 137 135 - 145 mmol/L   Potassium 4.5 3.5 - 5.1 mmol/L   Chloride 111 98 - 111 mmol/L   CO2 20 (L) 22 - 32 mmol/L   Glucose, Bld 88 70 - 99 mg/dL   BUN 62 (H) 8 - 23 mg/dL   Creatinine, Ser 2.42 (H) 0.44 - 1.00 mg/dL   Calcium 8.6 (L) 8.9 - 10.3 mg/dL   Total Protein 6.6 6.5 - 8.1 g/dL   Albumin 3.9 3.5 - 5.0 g/dL   AST 35 15 - 41 U/L   ALT 19 0 - 44 U/L   Alkaline Phosphatase 78 38 - 126 U/L   Total Bilirubin 0.7 0.3 - 1.2 mg/dL   GFR calc non Af Amer 17 (L) >60 mL/min   GFR calc Af Amer 19 (L) >60 mL/min     Comment: (NOTE) The eGFR has been calculated using the CKD EPI equation. This calculation has not been validated in all clinical situations. eGFR's persistently <60 mL/min signify possible Chronic Kidney Disease.    Anion gap 6 5 - 15    Comment: Performed at [facility], [address] [place], [place] [address]  Basic metabolic panel     Status: Abnormal   Collection Time: 02/19/18 11:00 AM  Result Value Ref Range   Sodium 141 135 - 145 mmol/L   Potassium 4.3 3.5 - 5.1 mmol/L   Chloride 113 (H) 98 - 111 mmol/L   CO2 19 (L) 22 - 32 mmol/L   Glucose, Bld 89 70 - 99 mg/dL   BUN 47 (H) 8 - 23 mg/dL   Creatinine, Ser 2.08 (H) 0.44 - 1.00 mg/dL   Calcium 9.2 8.9 - 10.3 mg/dL   GFR calc non Af Amer 20 (L) >60 mL/min   GFR calc Af Amer 23 (L) >60 mL/min    Comment: (NOTE) The eGFR has been calculated using the CKD EPI equation. This calculation has not been validated in all clinical situations. eGFR's persistently <60 mL/min signify possible Chronic Kidney Disease.    Anion gap 9 5 - 15    Comment: Performed at [facility], [address] [place], [place] [address]    Current Facility-Administered Medications  Medication Dose Route Frequency Provider Last Rate Last Dose  . 0.9 %  sodium chloride infusion   Intravenous Continuous Epifanio Lesches, MD 75 mL/hr at 02/19/18 1324    . acetaminophen (TYLENOL) tablet 500 mg  500 mg Oral Q6H PRN Gouru, Illene Silver, MD   500 mg at 02/18/18 0352  . [START ON 02/20/2018] buPROPion (WELLBUTRIN SR) 12 hr tablet 150 mg  150 mg Oral Daily Chauncey Mann, MD      . cefTRIAXone (ROCEPHIN) 1 g in sodium chloride 0.9 % 100 mL IVPB  1 g Intravenous Q24H Dustin Flock, MD   Stopped at 02/19/18 1000  . Chlorhexidine Gluconate Cloth 2 % PADS 6 each  6 each Topical Q0600 Fritzi Mandes, MD   6 each at 02/19/18 [id number]  . citalopram (CELEXA) tablet 20 mg  20 mg Oral Daily Gouru, Aruna, MD   20 mg at 02/19/18 0917  . docusate sodium (COLACE)  capsule 100 mg  100 mg Oral BID Gouru, Aruna, MD   100 mg at 02/19/18 0916  . enoxaparin (LOVENOX) injection 30 mg  30 mg Subcutaneous Q24H Gouru, Aruna,  MD   30 mg at 02/18/18 2148  . famotidine (PEPCID) tablet 20 mg  20 mg Oral QHS Gouru, Aruna, MD   20 mg at 02/18/18 2147  . feeding supplement (GLUCERNA SHAKE) (GLUCERNA SHAKE) liquid 237 mL  237 mL Oral TID BM Fritzi Mandes, MD   237 mL at 02/19/18 0918  . gabapentin (NEURONTIN) capsule 300 mg  300 mg Oral QHS Gouru, Aruna, MD   300 mg at 02/18/18 2147  . haloperidol lactate (HALDOL) injection 1 mg  1 mg Intravenous Q6H PRN Clapacs, Madie Reno, MD   1 mg at 02/19/18 0626  . hydrALAZINE (APRESOLINE) tablet 50 mg  50 mg Oral BID Dustin Flock, MD   50 mg at 02/19/18 0917  . loratadine (CLARITIN) tablet 10 mg  10 mg Oral Daily PRN Gouru, Aruna, MD      . metoprolol succinate (TOPROL-XL) 24 hr tablet 37.5 mg  37.5 mg Oral QHS Gouru, Aruna, MD   37.5 mg at 02/18/18 2147  . multivitamin with minerals tablet 1 tablet  1 tablet Oral Daily Fritzi Mandes, MD   1 tablet at 02/19/18 0917  . mupirocin ointment (BACTROBAN) 2 % 1 application  1 application Nasal BID Fritzi Mandes, MD   1 application at 16/10/96 0919  . ondansetron (ZOFRAN) tablet 4 mg  4 mg Oral Q6H PRN Gouru, Aruna, MD       Or  . ondansetron (ZOFRAN) injection 4 mg  4 mg Intravenous Q6H PRN Gouru, Aruna, MD      . pantoprazole (PROTONIX) EC tablet 40 mg  40 mg Oral QAC breakfast Gouru, Aruna, MD   40 mg at 02/19/18 0916  . risperiDONE (RISPERDAL M-TABS) disintegrating tablet 0.5 mg  0.5 mg Oral Q8H Clapacs, John T, MD   0.5 mg at 02/19/18 1324  . simvastatin (ZOCOR) tablet 20 mg  20 mg Oral QPM Gouru, Aruna, MD   20 mg at 02/17/18 2352  . traZODone (DESYREL) tablet 50 mg  50 mg Oral QHS Chauncey Mann, MD        Musculoskeletal: Strength & Muscle Tone: within normal limits Gait & Station: unable to stand Patient leans: N/A  Psychiatric Specialty Exam: Physical Exam  Nursing note and vitals  reviewed.   Review of Systems  Unable to perform ROS: Mental acuity  Psychiatric/Behavioral: Positive for hallucinations and memory loss. Negative for depression, substance abuse and suicidal ideas. The patient is not nervous/anxious and does not have insomnia.     Blood pressure (!) 143/74, pulse 64, temperature 97.9 F (36.6 C), temperature source Oral, resp. rate 18, height '5\' 4"'  (1.626 m), weight 66.2 kg, SpO2 94 %.Body mass index is 25.06 kg/m.  General Appearance: In hospital gown  Eye Contact:  Fair  Speech:  Pressured  Volume:  Normal  Mood:  "great"  Affect:  Inappropriate; labile  Thought Process:  Disorganized  Orientation:  Negative  Thought Content:  Illogical and Tangential  Suicidal Thoughts:  No  Homicidal Thoughts:  No  Memory:  Immediate;   Fair Recent;   Poor Remote;   Poor  Judgement:  Impaired  Insight:  Lacking  Psychomotor Activity:  Restlessness  Concentration:  Concentration: Poor  Recall:  Poor  Fund of Knowledge:  Poor  Language:  Good  Akathisia:  No  Handed:  Right  AIMS (if indicated):     Assets:  Desire for Improvement Housing Social Support  ADL's:  Impaired  Cognition:  Impaired,  Moderate  Sleep:  Treatment Plan Summary: Ms. Lague is a 84 year old female admitted to the medicine service with a UTI and acute delirium.  Altered mental status has improved since yesterday.  Yesterday, she was very restless and trying to climb out of the bed.  Today, she is calmer but still very confused. She did not sleep until IM Zyprexa was given last night  Delirium and History of Depression -We will change Wellbutrin XL to Wellbutrin SR 150 mg p.o. daily as extended release Wellbutrin may be interfering with insomnia.  Will continue Celexa 20 mg p.o. daily for now. - Will add Trazodone 77m po nightly for insomnia for next 2-3 days to help with sleep dysregulation until her delirium clears -We will continue Risperdal M tab 0.5 mg p.o. 3 times  daily for now - Will continue Haldol 180mIV every 6 hours as needed for agitation and psychosis -Please keep 1:1 sitter for now - She may need followup for depression as it is unclear who she is seeing for recent grief    Disposition: No evidence of imminent risk to self or others at present.   Patient does not meet criteria for psychiatric inpatient admission. Supportive therapy provided about ongoing stressors. Discussed crisis plan, support from social network, calling 911, coming to the Emergency Department, and calling Suicide Hotline.  AaChauncey MannMD 02/19/2018 2:44 PM

## 2018-02-19 NOTE — Progress Notes (Signed)
Pt very agitated and combative. Primary nurse paged and spoke to Dr. Duane [place]. Orders received for Zyprexa 5 mg IM once. Primary nurse to continue to monitor.

## 2018-02-19 NOTE — Progress Notes (Signed)
Per MD okay for RN to re order iv fluids.

## 2018-02-19 NOTE — Progress Notes (Addendum)
[place] [facility]                                                                                                                                                                                  Patient Demographics   Tina Curtis, is a 84 y.o. female, [name] - May 13, 1926, [id number]  Admit date - 02/17/2018   Admitting Physician Nicholes Mango, MD  Outpatient Primary MD for the patient is Derinda Late, MD   LOS - 2  Subjective: Patient confused .  Had episodes of agitation, getting out of the bed, required Geodon, started on Risperdal by psychiatry. Still confused but less agitated,less hallucinations as per daughter who is at bedside.poor po inate,appears very dry, Review of Systems:   CONSTITUTIONAL: Confused  Vitals:   Vitals:   02/18/18 1205 02/18/18 2133 02/18/18 2305 02/19/18 0916  BP: (!) 188/71 (!) 193/73 (!) 182/72 (!) 143/74  Pulse: 63 64 66 64  Resp: 16 20 18    Temp: 97.8 F (36.6 C) 98.1 F (36.7 C) 98.2 F (36.8 C)   TempSrc: Oral Oral Oral   SpO2: 100% 96% 94%   Weight:      Height:        Wt Readings from Last 3 Encounters:  02/17/18 66.2 kg  10/24/14 65.3 kg     Intake/Output Summary (Last 24 hours) at 02/19/2018 1248 Last data filed at 02/18/2018 1840 Gross per 24 hour  Intake 1003.27 ml  Output 0 ml  Net 1003.27 ml    Physical Exam:   GENERAL: Pleasant-appearing in no apparent distress.   HEAD, EYES, EARS, NOSE AND THROAT: Atraumatic, normocephalic. Extraocular muscles are intact. Pupils equal and reactive to light. Sclerae anicteric. No conjunctival injection. No oro-pharyngeal erythema.  NECK: Supple. There is no jugular venous distention. No bruits, no lymphadenopathy, no thyromegaly.  HEART: Regular rate and rhythm,. No murmurs, no rubs, no clicks.  LUNGS: Clear to auscultation bilaterally. No rales or rhonchi. No wheezes.  ABDOMEN: Soft, flat, nontender, nondistended. Has good bowel sounds. No  hepatosplenomegaly appreciated.  EXTREMITIES: No evidence of any cyanosis, clubbing, or peripheral edema.  +2 pedal and radial pulses bilaterally.  NEUROLOGIC: unable to follow commands for full neuro exam.. SKIN: Moist and warm with no rashes appreciated.  Psych: Not anxious, depressed LN: No inguinal LN enlargement    Antibiotics   Anti-infectives (From admission, onward)   Start     Dose/Rate Route Frequency Ordered Stop   02/19/18 2200  levofloxacin (LEVAQUIN) IVPB 500 mg  Status:  Discontinued     500 mg 100 mL/hr over 60 Minutes Intravenous Every 48 hours 02/17/18 1904 02/18/18 1313   02/19/18 1000  cefTRIAXone (ROCEPHIN) 1 g in sodium chloride 0.9 % 100 mL IVPB     1 g 200 mL/hr over 30 Minutes Intravenous Every 24 hours 02/18/18 1313     02/17/18 2200  levofloxacin (LEVAQUIN) IVPB 750 mg     750 mg 100 mL/hr over 90 Minutes Intravenous  Once 02/17/18 1904 02/17/18 2340   02/17/18 1900  levofloxacin (LEVAQUIN) IVPB 500 mg  Status:  Discontinued     500 mg 100 mL/hr over 60 Minutes Intravenous Every 24 hours 02/17/18 1845 02/17/18 1904   02/17/18 1845  cefTRIAXone (ROCEPHIN) 1 g in sodium chloride 0.9 % 100 mL IVPB     1 g 200 mL/hr over 30 Minutes Intravenous  Once 02/17/18 1838 02/17/18 1953      Medications   Scheduled Meds: . buPROPion  150 mg Oral Daily  . Chlorhexidine Gluconate Cloth  6 each Topical Q0600  . citalopram  20 mg Oral Daily  . docusate sodium  100 mg Oral BID  . enoxaparin (LOVENOX) injection  30 mg Subcutaneous Q24H  . famotidine  20 mg Oral QHS  . feeding supplement (GLUCERNA SHAKE)  237 mL Oral TID BM  . gabapentin  300 mg Oral QHS  . hydrALAZINE  50 mg Oral BID  . metoprolol succinate  37.5 mg Oral QHS  . multivitamin with minerals  1 tablet Oral Daily  . mupirocin ointment  1 application Nasal BID  . pantoprazole  40 mg Oral QAC breakfast  . risperiDONE  0.5 mg Oral Q8H  . simvastatin  20 mg Oral QPM   Continuous Infusions: . sodium  chloride 75 mL/hr at 02/19/18 1045  . cefTRIAXone (ROCEPHIN)  IV 1 g (02/19/18 0930)   PRN Meds:.acetaminophen, haloperidol lactate, loratadine, ondansetron **OR** ondansetron (ZOFRAN) IV   Data Review:   Micro Results Recent Results (from the past 240 hour(s))  Urine culture     Status: Abnormal (Preliminary result)   Collection Time: 02/17/18  4:29 PM  Result Value Ref Range Status   Specimen Description   Final    URINE, RANDOM Performed at [facility], [address] [place], [place] [address]    Special Requests   Final    NONE Performed at [facility], [address] [place], [place] [address]    Culture (A)  Final    >=100,000 COLONIES/mL ESCHERICHIA COLI SUSCEPTIBILITIES TO FOLLOW Performed at [facility], [place] [address] [place], [place] [address]    Report Status PENDING  Incomplete  MRSA PCR Screening     Status: Abnormal   Collection Time: 02/17/18  8:13 PM  Result Value Ref Range Status   MRSA by PCR POSITIVE (A) NEGATIVE Final    Comment:        The GeneXpert MRSA Assay (FDA approved for NASAL specimens only), is one component of a comprehensive MRSA colonization surveillance program. It is not intended to diagnose MRSA infection nor to guide or monitor treatment for MRSA infections. RESULT CALLED TO, READ BACK BY AND VERIFIED WITH: C/YASMIN FUENTES @2240  02/17/18 [facility] Performed at [facility], [address] [place], [place] [address]     Radiology Reports Ct Head Wo Contrast  Result Date: 02/17/2018 CLINICAL DATA:  Intermittent confusion over the last 1 month. EXAM: CT HEAD WITHOUT CONTRAST TECHNIQUE: Contiguous axial images were obtained from the base of the skull through the vertex without intravenous contrast. COMPARISON:  None. FINDINGS: Brain: Mild generalized atrophy and white matter disease are present. Scattered  hypoattenuation is present in the basal ganglia, likely remote. A CSF density lacunar  infarct is present in the left lentiform nucleus. The brainstem is within normal limits. A remote lacunar infarct is present in the medial right cerebellum on image 7. No acute cortical infarct, hemorrhage, or mass lesion is present. Vascular: Atherosclerotic calcifications are present within the cavernous internal carotid arteries bilaterally. There is no hyperdense vessel. Skull: Calvarium is intact. No focal lytic or blastic lesion is present. Sinuses/Orbits: The paranasal sinuses and mastoid air cells are clear. Bilateral lens replacements are present. Globes and orbits are otherwise within normal limits. IMPRESSION: 1. No acute or focal lesion to explain the patient's intermittent confusion. 2. Generalized atrophy and white matter disease likely reflects the sequela of chronic microvascular ischemia. 3. Lacunar infarcts of the basal ganglia bilaterally appear remote. 4. Atherosclerosis. Electronically Signed   By: San Morelle M.D.   On: 02/17/2018 17:25     CBC Recent Labs  Lab 02/17/18 1607 02/18/18 0612  WBC 8.9 8.2  HGB 12.1 10.9*  HCT 35.7* 31.9*  PLT 165 138*  MCV 93.7 94.7  MCH 31.8 32.3  MCHC 33.9 34.2  RDW 13.2 13.1    Chemistries  Recent Labs  Lab 02/17/18 1607 02/18/18 0612 02/19/18 1100  NA 136 137 141  K 5.0 4.5 4.3  CL 105 111 113*  CO2 20* 20* 19*  GLUCOSE 117* 88 89  BUN 70* 62* 47*  CREATININE 3.20* 2.42* 2.08*  CALCIUM 9.5 8.6* 9.2  AST 32 35  --   ALT 20 19  --   ALKPHOS 92 78  --   BILITOT 0.7 0.7  --    ------------------------------------------------------------------------------------------------------------------ estimated creatinine clearance is 17.5 mL/min (A) (by C-G formula based on SCr of 2.08 mg/dL (H)). ------------------------------------------------------------------------------------------------------------------ No results for input(s): HGBA1C in the last 72  hours. ------------------------------------------------------------------------------------------------------------------ No results for input(s): CHOL, HDL, LDLCALC, TRIG, CHOLHDL, LDLDIRECT in the last 72 hours. ------------------------------------------------------------------------------------------------------------------ No results for input(s): TSH, T4TOTAL, T3FREE, THYROIDAB in the last 72 hours.  Invalid input(s): FREET3 ------------------------------------------------------------------------------------------------------------------ No results for input(s): VITAMINB12, FOLATE, FERRITIN, TIBC, IRON, RETICCTPCT in the last 72 hours.  Coagulation profile No results for input(s): INR, PROTIME in the last 168 hours.  No results for input(s): DDIMER in the last 72 hours.  Cardiac Enzymes Recent Labs  Lab 02/17/18 1607  TROPONINI <0.03   ------------------------------------------------------------------------------------------------------------------ Invalid input(s): POCBNP    Assessment & Plan   #Delirium from acute kidney injury and UTI Admit to MedSurg unit Hydrate with IV fluids, continue IV fluids because of poor p.o. intake, agitation episodes: Change antibiotics IV ceftriaxone CT head negative Delirium seems to be improving, seen by psychiatry, started on Risperdal.continue geodon as needed Continue sitter spoke to daugheter  #Acute kidney injury on chronic kidney disease stage IV Avoid nephrotoxins Holding Hyzaar and allopurinol Continue IV fluids Renal dose rest of the medications, acute kidney injury is improving.   #UTI Urine culture showed E. coli, continue Rocephin, follow full sensitivity results.  #Dizziness from dehydration Continue IV hydration  #Essential hypertension Continue hydralazine and Toprol-XL hold Hyzaar  #Hyperlipidemia continue home medication Zocor  #Gout ;no exacerbation at this time Hold allopurinol  DVT prophylaxis  Lovenox subcu renal dose adjusted GI prophylaxis with Protonix   Still has confusion but  Hallucinations are better Continue sitter,risperidol Change to dysphagia diet for today and tomorrow to prevent aspiration D/w daughter     Code Status Orders  (From admission, onward)  Start     Ordered   02/17/18 1858  Do not attempt resuscitation (DNR)  Continuous    Question Answer Comment  In the event of cardiac or respiratory ARREST Do not call a "code blue"   In the event of cardiac or respiratory ARREST Do not perform Intubation, CPR, defibrillation or ACLS   In the event of cardiac or respiratory ARREST Use medication by any route, position, wound care, and other measures to relive pain and suffering. May use oxygen, suction and manual treatment of airway obstruction as needed for comfort.   Comments RN MAY PRONOUNCE      02/17/18 1857        Code Status History    This patient has a current code status but no historical code status.    Advance Directive Documentation     Most Recent Value  Type of Advance Directive  Healthcare Power of [place], Out of facility DNR (pink MOST or yellow form)  Pre-existing out of facility DNR order (yellow form or pink MOST form)  Yellow form placed in chart (order not valid for inpatient use)  "MOST" Form in Place?  -           Consults none   DVT Prophylaxis  Lovenox  Lab Results  Component Value Date   PLT 138 (L) 02/18/2018     Time Spent in minutes 35 minutes  greater than 50% of time spent in care coordination and counseling patient regarding the condition and plan of care.   Epifanio Lesches M.D on 02/19/2018 at 12:48 PM  Between 7am to 6pm - Pager - [phone]  After 6pm go to [email or web address] - Proofreader  [employer]   Office  [phone]

## 2018-02-20 MED ORDER — RISPERIDONE 1 MG PO TBDP
0.5000 mg | ORAL_TABLET | Freq: Two times a day (BID) | ORAL | Status: DC | PRN
  Administered 2018-02-20: 0.5 mg via ORAL
  Filled 2018-02-20 (×2): qty 0.5

## 2018-02-20 MED ORDER — RISPERIDONE 1 MG PO TBDP
0.5000 mg | ORAL_TABLET | Freq: Two times a day (BID) | ORAL | Status: DC
  Filled 2018-02-20: qty 0.5

## 2018-02-20 MED ORDER — TRAZODONE HCL 50 MG PO TABS
25.0000 mg | ORAL_TABLET | Freq: Every day | ORAL | Status: DC
  Administered 2018-02-20 – 2018-02-21 (×2): 25 mg via ORAL
  Filled 2018-02-20 (×2): qty 1

## 2018-02-20 NOTE — Progress Notes (Signed)
[place] [facility]                                                                                                                                                                                  Patient Demographics   Tina Curtis, is a 84 y.o. female, [name] - 13-May-1926, [id number]  Admit date - 02/17/2018   Admitting Physician Nicholes Mango, MD  Outpatient Primary MD for the patient is Derinda Late, MD   LOS - 3  Subjective: She slept most of the night without episodes of agitation but the day before.  Mostly sleeping during the day and also slept all night without episodes of agitation.  Both daughters are at bedside.  And asked me lots of questions.  Sitter is at the bedside. Review of Systems:   CONSTITUTIONAL: Confused  Vitals:   Vitals:   02/19/18 1355 02/19/18 1446 02/19/18 2037 02/20/18 0518  BP:  (!) 144/69 128/68 (!) 143/68  Pulse:  74 69 69  Resp:  16 16 16   Temp: 97.9 F (36.6 C) 98.1 F (36.7 C) 97.7 F (36.5 C) 97.9 F (36.6 C)  TempSrc: Oral Oral Oral Oral  SpO2:  96% 96% 94%  Weight:      Height:        Wt Readings from Last 3 Encounters:  02/17/18 66.2 kg  10/24/14 65.3 kg     Intake/Output Summary (Last 24 hours) at 02/20/2018 1053 Last data filed at 02/20/2018 0859 Gross per 24 hour  Intake 1758.04 ml  Output -  Net 1758.04 ml    Physical Exam:   GENERAL: Pleasant-appearing in no apparent distress.   HEAD, EYES, EARS, NOSE AND THROAT: Atraumatic, normocephalic. Extraocular muscles are intact. Pupils equal and reactive to light. Sclerae anicteric. No conjunctival injection. No oro-pharyngeal erythema.  NECK: Supple. There is no jugular venous distention. No bruits, no lymphadenopathy, no thyromegaly.  HEART: Regular rate and rhythm,. No murmurs, no rubs, no clicks.  LUNGS: Clear to auscultation bilaterally. No rales or rhonchi. No wheezes.  ABDOMEN: Soft, flat, nontender, nondistended. Has good bowel  sounds. No hepatosplenomegaly appreciated.  EXTREMITIES: No evidence of any cyanosis, clubbing, or peripheral edema.  +2 pedal and radial pulses bilaterally.  NEUROLOGIC: unable to follow commands for full neuro exam.. SKIN: Moist and warm with no rashes appreciated.  Psych: Not anxious, depressed LN: No inguinal LN enlargement    Antibiotics   Anti-infectives (From admission, onward)   Start     Dose/Rate Route Frequency Ordered Stop   02/19/18 2200  levofloxacin (LEVAQUIN) IVPB 500 mg  Status:  Discontinued     500 mg 100 mL/hr over 60 Minutes Intravenous  Every 48 hours 02/17/18 1904 02/18/18 1313   02/19/18 1000  cefTRIAXone (ROCEPHIN) 1 g in sodium chloride 0.9 % 100 mL IVPB     1 g 200 mL/hr over 30 Minutes Intravenous Every 24 hours 02/18/18 1313     02/17/18 2200  levofloxacin (LEVAQUIN) IVPB 750 mg     750 mg 100 mL/hr over 90 Minutes Intravenous  Once 02/17/18 1904 02/17/18 2340   02/17/18 1900  levofloxacin (LEVAQUIN) IVPB 500 mg  Status:  Discontinued     500 mg 100 mL/hr over 60 Minutes Intravenous Every 24 hours 02/17/18 1845 02/17/18 1904   02/17/18 1845  cefTRIAXone (ROCEPHIN) 1 g in sodium chloride 0.9 % 100 mL IVPB     1 g 200 mL/hr over 30 Minutes Intravenous  Once 02/17/18 1838 02/17/18 1953      Medications   Scheduled Meds: . buPROPion  150 mg Oral Daily  . Chlorhexidine Gluconate Cloth  6 each Topical Q0600  . citalopram  20 mg Oral Daily  . docusate sodium  100 mg Oral BID  . enoxaparin (LOVENOX) injection  30 mg Subcutaneous Q24H  . famotidine  20 mg Oral QHS  . feeding supplement (GLUCERNA SHAKE)  237 mL Oral TID BM  . gabapentin  300 mg Oral QHS  . hydrALAZINE  50 mg Oral BID  . metoprolol succinate  37.5 mg Oral QHS  . multivitamin with minerals  1 tablet Oral Daily  . mupirocin ointment  1 application Nasal BID  . pantoprazole  40 mg Oral QAC breakfast  . risperiDONE  0.5 mg Oral Q8H  . simvastatin  20 mg Oral QPM  . traZODone  50 mg Oral  QHS   Continuous Infusions: . sodium chloride 75 mL/hr at 02/20/18 0859  . cefTRIAXone (ROCEPHIN)  IV Stopped (02/19/18 1000)   PRN Meds:.acetaminophen, haloperidol lactate, loratadine, ondansetron **OR** ondansetron (ZOFRAN) IV   Data Review:   Micro Results Recent Results (from the past 240 hour(s))  Urine culture     Status: Abnormal (Preliminary result)   Collection Time: 02/17/18  4:29 PM  Result Value Ref Range Status   Specimen Description   Final    URINE, RANDOM Performed at [facility], [address], [place], [place] [address]    Special Requests   Final    NONE Performed at [facility], [address] [place], [place] [address]    Culture (A)  Final    >=100,000 COLONIES/mL ESCHERICHIA COLI SUSCEPTIBILITIES TO FOLLOW Performed at [facility], [place] [address], [place], [place] [address]    Report Status PENDING  Incomplete  MRSA PCR Screening     Status: Abnormal   Collection Time: 02/17/18  8:13 PM  Result Value Ref Range Status   MRSA by PCR POSITIVE (A) NEGATIVE Final    Comment:        The GeneXpert MRSA Assay (FDA approved for NASAL specimens only), is one component of a comprehensive MRSA colonization surveillance program. It is not intended to diagnose MRSA infection nor to guide or monitor treatment for MRSA infections. RESULT CALLED TO, READ BACK BY AND VERIFIED WITH: C/YASMIN FUENTES @2240  02/17/18 [facility] Performed at [facility], [address], [place], [place] [address]     Radiology Reports Ct Head Wo Contrast  Result Date: 02/17/2018 CLINICAL DATA:  Intermittent confusion over the last 1 month. EXAM: CT HEAD WITHOUT CONTRAST TECHNIQUE: Contiguous axial images were obtained from the base of the skull through the  vertex without intravenous contrast. COMPARISON:  None. FINDINGS: Brain: Mild generalized atrophy and white matter disease are present. Scattered hypoattenuation is present in the basal  ganglia, likely remote. A CSF density lacunar infarct is present in the left lentiform nucleus. The brainstem is within normal limits. A remote lacunar infarct is present in the medial right cerebellum on image 7. No acute cortical infarct, hemorrhage, or mass lesion is present. Vascular: Atherosclerotic calcifications are present within the cavernous internal carotid arteries bilaterally. There is no hyperdense vessel. Skull: Calvarium is intact. No focal lytic or blastic lesion is present. Sinuses/Orbits: The paranasal sinuses and mastoid air cells are clear. Bilateral lens replacements are present. Globes and orbits are otherwise within normal limits. IMPRESSION: 1. No acute or focal lesion to explain the patient's intermittent confusion. 2. Generalized atrophy and white matter disease likely reflects the sequela of chronic microvascular ischemia. 3. Lacunar infarcts of the basal ganglia bilaterally appear remote. 4. Atherosclerosis. Electronically Signed   By: San Morelle M.D.   On: 02/17/2018 17:25     CBC Recent Labs  Lab 02/17/18 1607 02/18/18 0612  WBC 8.9 8.2  HGB 12.1 10.9*  HCT 35.7* 31.9*  PLT 165 138*  MCV 93.7 94.7  MCH 31.8 32.3  MCHC 33.9 34.2  RDW 13.2 13.1    Chemistries  Recent Labs  Lab 02/17/18 1607 02/18/18 0612 02/19/18 1100  NA 136 137 141  K 5.0 4.5 4.3  CL 105 111 113*  CO2 20* 20* 19*  GLUCOSE 117* 88 89  BUN 70* 62* 47*  CREATININE 3.20* 2.42* 2.08*  CALCIUM 9.5 8.6* 9.2  AST 32 35  --   ALT 20 19  --   ALKPHOS 92 78  --   BILITOT 0.7 0.7  --    ------------------------------------------------------------------------------------------------------------------ estimated creatinine clearance is 17.5 mL/min (A) (by C-G formula based on SCr of 2.08 mg/dL (H)). ------------------------------------------------------------------------------------------------------------------ No results for input(s): HGBA1C in the last 72  hours. ------------------------------------------------------------------------------------------------------------------ No results for input(s): CHOL, HDL, LDLCALC, TRIG, CHOLHDL, LDLDIRECT in the last 72 hours. ------------------------------------------------------------------------------------------------------------------ No results for input(s): TSH, T4TOTAL, T3FREE, THYROIDAB in the last 72 hours.  Invalid input(s): FREET3 ------------------------------------------------------------------------------------------------------------------ No results for input(s): VITAMINB12, FOLATE, FERRITIN, TIBC, IRON, RETICCTPCT in the last 72 hours.  Coagulation profile No results for input(s): INR, PROTIME in the last 168 hours.  No results for input(s): DDIMER in the last 72 hours.  Cardiac Enzymes Recent Labs  Lab 02/17/18 1607  TROPONINI <0.03   ------------------------------------------------------------------------------------------------------------------ Invalid input(s): POCBNP    Assessment & Plan   #Delirium from acute kidney injury and UTI Delirium seems to be improving, continue IV fluids, IV antibiotics for UTI, continue IV fluids because patient p.o. intake is still poor due to her delirium, sedation.    #Acute kidney injury on chronic kidney disease stage 3 Avoid nephrotoxins Holding Hyzaar and allopurinol Continue IV fluids Renal dose rest of the medications, acute kidney injury is improving. Renal function improving, patient does have CKD stage III as per PCP records, same explained to the patient's daughters that she has chronic kidney disease.  #UTI Urine culture showed E. coli, continue Rocephin, follow full sensitivity results.  #Dizziness from dehydration Continue IV hydration.  #Essential hypertension Continue hydralazine and Toprol-XL ,hold Hyzaar  #Hyperlipidemia continue home medication Zocor  #Gout ;no exacerbation at this time Hold  allopurinol  DVT prophylaxis Lovenox subcu renal dose adjusted GI prophylaxis with Protonix  84 year old female with UTI and acute delirium: According to family patient delirium is  new and never had mental problems.  Seen by psychiatric, patient is on Wellbutrin SR, Celexa 20 mg daily, trazodone 50 mg at night for insomnia, Risperdal.  Continue the sitter.  Patient daughters told me that dad expired recently 3 months ago and she also had loss of sister recently.  And patient is dealing with depression and going to grief counselors and daughter mentioned that she also has hearing problem and going to grief counselors and not able to hear is also a problem.    Spoke to patient's daughter for about 15 minutes    Code Status Orders  (From admission, onward)         Start     Ordered   02/17/18 1858  Do not attempt resuscitation (DNR)  Continuous    Question Answer Comment  In the event of cardiac or respiratory ARREST Do not call a "code blue"   In the event of cardiac or respiratory ARREST Do not perform Intubation, CPR, defibrillation or ACLS   In the event of cardiac or respiratory ARREST Use medication by any route, position, wound care, and other measures to relive pain and suffering. May use oxygen, suction and manual treatment of airway obstruction as needed for comfort.   Comments RN MAY PRONOUNCE      02/17/18 1857        Code Status History    This patient has a current code status but no historical code status.    Advance Directive Documentation     Most Recent Value  Type of Advance Directive  Healthcare Power of [place], Out of facility DNR (pink MOST or yellow form)  Pre-existing out of facility DNR order (yellow form or pink MOST form)  Yellow form placed in chart (order not valid for inpatient use)  "MOST" Form in Place?  -           Consults ' psychiatriy   DVT Prophylaxis  Lovenox  Lab Results  Component Value Date   PLT 138 (L) 02/18/2018      Time Spent in minutes 38 minutes  greater than 50% of time spent in care coordination and counseling patient regarding the condition and plan of care.   Epifanio Lesches M.D on 02/20/2018 at 10:53 AM  Between 7am to 6pm - Pager - [phone]  After 6pm go to [email or web address] - Proofreader  [employer]   Office  [phone]

## 2018-02-20 NOTE — Consult Note (Addendum)
[facility] Consult   Reason for Consult: Consult for 84 year old woman in the hospital with urinary tract infection who is exhibiting delirium Referring Physician: Gouru Patient Identification: Tina Curtis MRN:  [id number] Principal Diagnosis: Acute delirium Diagnosis:   Patient Active Problem List   Diagnosis Date Noted  . Acute delirium [R41.0] 02/18/2018  . AKI (acute kidney injury) ([place]) [N17.9] 02/17/2018    Total Time spent with patient: 20 minutes  Subjective:    Patient seen chart reviewed.  The patient is now doing much better in terms of her mental status compared to yesterday. She has not needed any prn Haldol or Zyprexa. She is alert and oriented to time, place, and situation. The patient does have insight into the fact that she has a urinary tract infection.  Thought processes are still tangential at the time and the patient had to be redirected during the conversation multiple times.  Per sitter, she has not been very confused this afternoon but this morning was worse.  No recent periods of agitation or nursing.  She does not feel her level of depression warrants the antidepressants she is on. Her daughters were not in the room to provide any collateral information.   Medical history: In the hospital with urinary tract infection she has chronic renal insufficiency history of arthritis hypertension elevated cholesterol  Social history: Evidently lives at [facility].  Notes report that a niece has been present today.  Patient tells me the niece lives in [place].  Patient says she has 2 daughters 1 of whom lives in [place].  She cannot remember where the other one lives.  Notes reports that 1 of them is the power of attorney.  Patient tells me her husband just died 3 months ago I suspect she may have the timing wrong.  Substance abuse history: Denies alcohol abuse there is nothing in the chart about any past substance abuse issues  Past Psychiatric History:  Patient was on citalopram and bupropion recently.  These seem to have been chronic medications from her outpatient provider.  Not clear exactly what the original indication was.  The patient does not report any depression and there is nothing in the old chart that I can find about depression or anxiety being an ongoing problem.  No known history of psychiatric hospitalization.  Risk to Self:  No Risk to Others:   Prior Inpatient Therapy:  No Prior Outpatient Therapy:  No  Past Medical History:  Past Medical History:  Diagnosis Date  . Arthritis    Gout  . Hypercholesteremia   . Hypertension   . Neuropathy    feet and lower legs  . Seasonal allergies   . Skin cancer    face    Past Surgical History:  Procedure Laterality Date  . ABDOMINAL HYSTERECTOMY  2000  . APPENDECTOMY    . CATARACT EXTRACTION Left   . CATARACT EXTRACTION W/PHACO Right 10/24/2014   Procedure: CATARACT EXTRACTION PHACO AND INTRAOCULAR LENS PLACEMENT (IOC);  Surgeon: Leandrew Koyanagi, MD;  Location: [facility];  Service: Ophthalmology;  Laterality: Right;   Family History: History reviewed. No pertinent family history. Family Psychiatric  History: None known Social History:  Social History   Substance and Sexual Activity  Alcohol Use No     Social History   Substance and Sexual Activity  Drug Use Not on file    Social History   Socioeconomic History  . Marital status: Widowed    Spouse name: Not on file  .  Number of children: Not on file  . Years of education: Not on file  . Highest education level: Not on file  Occupational History  . Not on file  Social Needs  . Financial resource strain: Not on file  . Food insecurity:    Worry: Not on file    Inability: Not on file  . Transportation needs:    Medical: Not on file    Non-medical: Not on file  Tobacco Use  . Smoking status: Never Smoker  . Smokeless tobacco: Never Used  Substance and Sexual Activity  . Alcohol use: No  .  Drug use: Not on file  . Sexual activity: Not on file  Lifestyle  . Physical activity:    Days per week: Not on file    Minutes per session: Not on file  . Stress: Not on file  Relationships  . Social connections:    Talks on phone: Not on file    Gets together: Not on file    Attends religious service: Not on file    Active member of club or organization: Not on file    Attends meetings of clubs or organizations: Not on file    Relationship status: Not on file  Other Topics Concern  . Not on file  Social History Narrative  . Not on file   Additional Social History:    Allergies:   Allergies  Allergen Reactions  . Penicillins Anaphylaxis    Labs:  Results for orders placed or performed during the hospital encounter of 02/17/18 (from the past 48 hour(s))  Basic metabolic panel     Status: Abnormal   Collection Time: 02/19/18 11:00 AM  Result Value Ref Range   Sodium 141 135 - 145 mmol/L   Potassium 4.3 3.5 - 5.1 mmol/L   Chloride 113 (H) 98 - 111 mmol/L   CO2 19 (L) 22 - 32 mmol/L   Glucose, Bld 89 70 - 99 mg/dL   BUN 47 (H) 8 - 23 mg/dL   Creatinine, Ser 2.08 (H) 0.44 - 1.00 mg/dL   Calcium 9.2 8.9 - 10.3 mg/dL   GFR calc non Af Amer 20 (L) >60 mL/min   GFR calc Af Amer 23 (L) >60 mL/min    Comment: (NOTE) The eGFR has been calculated using the CKD EPI equation. This calculation has not been validated in all clinical situations. eGFR's persistently <60 mL/min signify possible Chronic Kidney Disease.    Anion gap 9 5 - 15    Comment: Performed at [facility], [address] [place], [place] [address]    Current Facility-Administered Medications  Medication Dose Route Frequency Provider Last Rate Last Dose  . 0.9 %  sodium chloride infusion   Intravenous Continuous Epifanio Lesches, MD 75 mL/hr at 02/20/18 0859    . acetaminophen (TYLENOL) tablet 500 mg  500 mg Oral Q6H PRN Gouru, Aruna, MD   500 mg at 02/19/18 1501  . buPROPion (WELLBUTRIN SR)  12 hr tablet 150 mg  150 mg Oral Daily Chauncey Mann, MD   150 mg at 02/20/18 1053  . cefTRIAXone (ROCEPHIN) 1 g in sodium chloride 0.9 % 100 mL IVPB  1 g Intravenous Q24H Dustin Flock, MD 200 mL/hr at 02/20/18 1101 1 g at 02/20/18 1101  . Chlorhexidine Gluconate Cloth 2 % PADS 6 each  6 each Topical Q0600 Fritzi Mandes, MD   6 each at 02/20/18 0559  . citalopram (CELEXA) tablet 20 mg  20 mg Oral Daily Gouru,  Illene Silver, MD   20 mg at 02/20/18 1053  . docusate sodium (COLACE) capsule 100 mg  100 mg Oral BID Gouru, Aruna, MD   100 mg at 02/20/18 1054  . enoxaparin (LOVENOX) injection 30 mg  30 mg Subcutaneous Q24H Gouru, Aruna, MD   30 mg at 02/19/18 2217  . famotidine (PEPCID) tablet 20 mg  20 mg Oral QHS Gouru, Illene Silver, MD   20 mg at 02/19/18 2216  . feeding supplement (GLUCERNA SHAKE) (GLUCERNA SHAKE) liquid 237 mL  237 mL Oral TID BM Fritzi Mandes, MD   237 mL at 02/20/18 1115  . gabapentin (NEURONTIN) capsule 300 mg  300 mg Oral QHS Gouru, Aruna, MD   300 mg at 02/19/18 2216  . haloperidol lactate (HALDOL) injection 1 mg  1 mg Intravenous Q6H PRN Clapacs, Madie [place], MD   1 mg at 02/19/18 0626  . hydrALAZINE (APRESOLINE) tablet 50 mg  50 mg Oral BID Dustin Flock, MD   50 mg at 02/20/18 1054  . loratadine (CLARITIN) tablet 10 mg  10 mg Oral Daily PRN Gouru, Aruna, MD      . metoprolol succinate (TOPROL-XL) 24 hr tablet 37.5 mg  37.5 mg Oral QHS Gouru, Aruna, MD   37.5 mg at 02/19/18 2216  . multivitamin with minerals tablet 1 tablet  1 tablet Oral Daily Fritzi Mandes, MD   1 tablet at 02/20/18 1053  . mupirocin ointment (BACTROBAN) 2 % 1 application  1 application Nasal BID Fritzi Mandes, MD   1 application at 22/29/79 1054  . ondansetron (ZOFRAN) tablet 4 mg  4 mg Oral Q6H PRN Gouru, Aruna, MD       Or  . ondansetron (ZOFRAN) injection 4 mg  4 mg Intravenous Q6H PRN Gouru, Aruna, MD      . pantoprazole (PROTONIX) EC tablet 40 mg  40 mg Oral QAC breakfast Gouru, Aruna, MD   40 mg at 02/20/18 1053  .  risperiDONE (RISPERDAL M-TABS) disintegrating tablet 0.5 mg  0.5 mg Oral BID Chauncey Mann, MD      . simvastatin (ZOCOR) tablet 20 mg  20 mg Oral QPM Gouru, Aruna, MD   20 mg at 02/19/18 1718  . traZODone (DESYREL) tablet 50 mg  50 mg Oral QHS Chauncey Mann, MD   50 mg at 02/19/18 2216    Musculoskeletal: Strength & Muscle Tone: Not able to assess Gait & Station: Not able to assess Patient leans: N/A  Psychiatric Specialty Exam: Physical Exam  Nursing note and vitals reviewed.   Review of Systems  Psychiatric/Behavioral: Positive for memory loss. Negative for depression, substance abuse and suicidal ideas. The patient is not nervous/anxious and does not have insomnia.     Blood pressure (!) 150/66, pulse 73, temperature 97.9 F (36.6 C), temperature source Oral, resp. rate 16, height _0  (1.626 m), weight 66.2 kg, SpO2 97 %.Body mass index is 25.06 kg/m.  General Appearance: In hospital gown  Eye Contact:  Fair  Speech:  No longer pressured but still hyperverbal  Volume:  Normal  Mood:  "OK"  Affect:  More appropriate  Thought Process:  Tangential, more organized  Orientation:  Negative  Thought Content:  Logical for the most part  Suicidal Thoughts:  No  Homicidal Thoughts:  No  Memory:  Immediate;   Fair Recent;   Poor Remote;   Poor  Judgement:  Impaired  Insight:  Lacking  Psychomotor Activity:  Calmer overall  Concentration:  Concentration: Poor  Recall:  Poor  Fund of Knowledge:  Poor  Language:  Good  Akathisia:  No  Handed:  Right  AIMS (if indicated):     Assets:  Desire for Improvement Housing Social Support  ADL's:  Impaired  Cognition:  Impaired,  Moderate  Sleep:        Treatment Plan Summary: Ms. Hickling is a 84 year old female admitted to the medicine service with a UTI and acute delirium.  Altered mental status has improved since yesterday.  Yesterday, she was very restless and trying to climb out of the bed.  Today, she is calmer but still  very confused. She did not sleep until IM Zyprexa was given last night  Delirium and History of Depression - She is now on Wellbutrin SR 150 mg p.o. daily as extended release Wellbutrin may be interfering with insomnia.  Will continue Celexa 20 mg p.o. daily for now for anxiety and depression. - Will decrease Trazodone to 52m po nightly for insomnia for next 2-3 days to help with sleep dysregulation until her delirium clears -We decrease Risperdal M tab 0.5 mg p.o. BID PRN ONLT - Will continue Haldol 144mIV every 6 hours as needed for agitation and psychosis -Please keep 1:1 sitter for now - Recommend she followup with [id number] medication management  Disposition: No evidence of imminent risk to self or others at present.   Patient does not meet criteria for psychiatric inpatient admission. Supportive therapy provided about ongoing stressors. Discussed crisis plan, support from social network, calling 911, coming to the Emergency Department, and calling Suicide Hotline.  AaChauncey MannMD 02/20/2018 2:45 PM

## 2018-02-21 LAB — CBC
HCT: 32.8 % — ABNORMAL LOW (ref 36.0–46.0)
HEMOGLOBIN: 10.9 g/dL — AB (ref 12.0–15.0)
MCH: 31.8 pg (ref 26.0–34.0)
MCHC: 33.2 g/dL (ref 30.0–36.0)
MCV: 95.6 fL (ref 80.0–100.0)
NRBC: 0 % (ref 0.0–0.2)
PLATELETS: 146 10*3/uL — AB (ref 150–400)
RBC: 3.43 MIL/uL — ABNORMAL LOW (ref 3.87–5.11)
RDW: 13.5 % (ref 11.5–15.5)
WBC: 8.4 10*3/uL (ref 4.0–10.5)

## 2018-02-21 LAB — BASIC METABOLIC PANEL
Anion gap: 7 (ref 5–15)
BUN: 41 mg/dL — ABNORMAL HIGH (ref 8–23)
CO2: 21 mmol/L — ABNORMAL LOW (ref 22–32)
Calcium: 8.1 mg/dL — ABNORMAL LOW (ref 8.9–10.3)
Chloride: 110 mmol/L (ref 98–111)
Creatinine, Ser: 1.92 mg/dL — ABNORMAL HIGH (ref 0.44–1.00)
GFR calc non Af Amer: 22 mL/min — ABNORMAL LOW (ref >60)
GFR, EST AFRICAN AMERICAN: 26 mL/min — AB (ref >60)
Glucose, Bld: 114 mg/dL — ABNORMAL HIGH (ref 70–99)
POTASSIUM: 4.2 mmol/L (ref 3.5–5.1)
Sodium: 138 mmol/L (ref 135–145)

## 2018-02-21 LAB — URINE CULTURE: Culture: >=100000 — AB

## 2018-02-21 MED ORDER — SCOPOLAMINE 1 MG/3DAYS TD PT72
1.0000 | MEDICATED_PATCH | TRANSDERMAL | Status: DC
  Administered 2018-02-21: 1.5 mg via TRANSDERMAL
  Filled 2018-02-21: qty 1

## 2018-02-21 MED ORDER — POLYETHYLENE GLYCOL 3350 17 G PO PACK: 17.0000 g | PACK | Freq: Every day | ORAL | Status: DC | PRN

## 2018-02-21 MED ORDER — CIPROFLOXACIN HCL 500 MG PO TABS
500.0000 mg | ORAL_TABLET | ORAL | Status: DC
  Administered 2018-02-21 – 2018-02-22 (×2): 500 mg via ORAL
  Filled 2018-02-21 (×2): qty 1

## 2018-02-21 NOTE — Progress Notes (Signed)
Occupational Therapy Evaluation Patient Details Name: Tina Curtis MRN: [id number] [name]: [birth year] Today's Date: 02/21/2018    History of Present Illness Pt is a 84 y/o F who presented with AMS, nausea, and dizziness. Pt with delirium due to acute kidney injury and UTI. Pt's PMH inculdes gout, neuropathy, skin cancer.     Clinical Impression   Pt seen for OT evaluation this date. Pt is a 84 y/o female who presented with AMS, nausea, and dizziness. Prior to admission, pt was independent with her ADLs, IADLS and her functional mobility. Pt lives alone following her husband's death in 15-Aug-2017 in a independent living facility at [facility] in [place], [place]. Currently, pt demonstrates impairments in cognition, memory, balance, and activity tolerance. Pt has been orthostatic during this hospital visit. Pt  presented in recliner for entire OT session.  With pt sitting and feet propped up HR 76, SpO2 97%, BP 197/78 and sitting up in recliner feet down HR 70, SpO2 98, BP 191/80. Pt. followed my line of questioning w/o tangential speech throughout session. Pt had sustained attention and fully participated in a situational safety activity and falls risk activity. Pt able to address many falls risks in pictured siutation and was able to problem solve through given problematic home scenerios. Pt/daughter educated in medicine mgt, falls risk mgt, energy conservation and cognitive compensatory strategies to increase independence and safety with ADLs and IADLs. Pt will benefit from skilled OT services to address noted impairments and functional deficits in order to minimize falls risk and caregiver burden and maximize independence and safety. OT recommends HHOT and 24 hour supervision/assistance upon discharge.     Follow Up Recommendations  Home health OT;Supervision/Assistance - 24 hour    Equipment Recommendations       Recommendations for Other Services       Precautions / Restrictions         Mobility Bed Mobility               General bed mobility comments: deferred, up in the recliner  Transfers                 General transfer comment: Deferred, MD in room to assess.    Balance Overall balance assessment: Needs assistance;History of Falls Sitting-balance support: Feet supported;No upper extremity supported Sitting balance-Leahy Scale: Good                                     ADL either performed or assessed with clinical judgement   ADL Overall ADL's : Needs assistance/impaired Eating/Feeding: Independent   Grooming: Supervision/safety;Sitting   Upper Body Bathing: Supervision/ safety;Sitting   Lower Body Bathing: Sitting/lateral leans;Supervison/ safety   Upper Body Dressing : Sitting;Supervision/safety Upper Body Dressing Details (indicate cue type and reason): Extra time required Lower Body Dressing: Supervision/safety;Sitting/lateral leans                       Vision Baseline Vision/History: Wears glasses Wears Glasses: At all times Patient Visual Report: No change from baseline       Perception     Praxis      Pertinent Vitals/Pain Pain Assessment: No/denies pain Pain Location: Pt did not provide a score for pain.     Hand Dominance Right   Extremity/Trunk Assessment Upper Extremity Assessment Upper Extremity Assessment: Overall WFL for tasks assessed   Lower Extremity Assessment Lower Extremity Assessment: Defer  to PT evaluation       Communication Communication Communication: HOH   Cognition Arousal/Alertness: Awake/alert Behavior During Therapy: WFL for tasks assessed/performed Overall Cognitive Status: History of cognitive impairments - at baseline                                 General Comments: Pt was alert and oriented x4. She followed my line of questioning w/o tangential speech. Pt had sustained attention and fully participated in the situational safety activity and  falls risk activity. Pt able to address many falls risks in pictured siutation and was able to problem solve through given problematic home scenerios.   General Comments  long sitting in recliner with feet propped up HR 76, SpO2 97%, BP 197/78; sitting up in recliner feet down HR 70, SpO2 98, BP 191/80    Exercises Other Exercises Other Exercises: pt/dtr educated in DME/AE to improve safety/indep with bathing and dressing to maximize safety/indep, minimize falls risk, and support energy conservation Other Exercises: pt/dtr instructed in cognitive compensatory strategies for medication mgt to maximize safety and adherence with taking at home, including using a weekly pill organizer Other Exercises: pt instructed in falls prevention strategies to minimize risk of future falls; pt able to correctly identify >90% of falls hazards on provided falls risk handout and able to accurately/appropriately verbalize reasoning for each risk Other Exercises: pt completed a situational safety exercise, pt able to provide appropriate responses to various prompts.   Shoulder Instructions      Home Living Family/patient expects to be discharged to:: Private residence Living Arrangements: Alone Available Help at Discharge: Neighbor;Friend(s);Available PRN/intermittently Type of Home: Independent living facility Home Access: Level entry     Home Layout: One level     Bathroom Shower/Tub: Occupational psychologist: Handicapped height     Home Equipment: Environmental consultant - 4 wheels;Grab bars - tub/shower;Shower seat      Lives With: Alone    Prior Functioning/Environment Level of Independence: Independent        Comments: Ind at baseline for ADLs, IADLs.  Pt ambulates without AD.  Reports 2 falls in the past 6 months (had tennis shoes on when fell in LR trying to close the blinds, possibly tripped on corner of bedspread; barefoot when in bathroom and fell). Spouse passed away in 2017/08/11. Pt generally  keeps am meds and pm meds on different shelves - doesn't use pill organizer        OT Problem List: Impaired balance (sitting and/or standing);Decreased cognition;Decreased safety awareness;Decreased activity tolerance      OT Treatment/Interventions: Self-care/ADL training;DME and/or AE instruction;Therapeutic activities;Balance training;Cognitive remediation/compensation;Patient/family education;Energy conservation    OT Goals(Current goals can be found in the care plan section) Acute Rehab OT Goals Patient Stated Goal: to return to PLOF OT Goal Formulation: With patient/family Time For Goal Achievement: 03/07/18 Potential to Achieve Goals: Good ADL Goals Additional ADL Goal #1: Pt will independently utilize 3-4 falls risk prevention strategies while completing ADL and IADL tasks. Additional ADL Goal #2: Pt will independently utilize medication mgt techniques, including using a pill box and routines for compliance of taking her medicine Additional ADL Goal #3: Pt will demonstrate problem solving techniques for increased safety and independence when completing ADL  and IADL tasks. Additional ADL Goal #4: Pt will independently utilize 1-2 energy conservation strategies when cooking meals.  OT Frequency: Min 1X/week   Barriers to D/C:  Co-evaluation              AM-PAC PT "6 Clicks" Daily Activity     Outcome Measure Help from another person eating meals?: None Help from another person taking care of personal grooming?: None Help from another person toileting, which includes using toliet, bedpan, or urinal?: A Little Help from another person bathing (including washing, rinsing, drying)?: A Little Help from another person to put on and taking off regular upper body clothing?: None Help from another person to put on and taking off regular lower body clothing?: None 6 Click Score: 22   End of Session    Activity Tolerance: Patient tolerated treatment well Patient  left: in chair;with call bell/phone within reach;with family/visitor present;with chair alarm set  OT Visit Diagnosis: Unsteadiness on feet (R26.81);History of falling (Z91.81);Dizziness and giddiness (R42);Other abnormalities of gait and mobility (R26.89)                Time: 8366-2947 OT Time Calculation (min): 44 min Charges:     Jadene Pierini OTS  Jadene Pierini 02/21/2018, 5:09 PM

## 2018-02-21 NOTE — Evaluation (Signed)
Speech Language Pathology Evaluation Patient Details Name: Tina Curtis MRN: [id number] [name]: 15-Jul-1926 Today's Date: 02/21/2018 Time: 2035-5974 SLP Time Calculation (min) (ACUTE ONLY): 47 min  Problem List:  Patient Active Problem List   Diagnosis Date Noted  . Acute delirium 02/18/2018  . AKI (acute kidney injury) ([place]) 02/17/2018   Past Medical History:  Past Medical History:  Diagnosis Date  . Arthritis    Gout  . Hypercholesteremia   . Hypertension   . Neuropathy    feet and lower legs  . Seasonal allergies   . Skin cancer    face   Past Surgical History:  Past Surgical History:  Procedure Laterality Date  . ABDOMINAL HYSTERECTOMY  2000  . APPENDECTOMY    . CATARACT EXTRACTION Left   . CATARACT EXTRACTION W/PHACO Right 10/24/2014   Procedure: CATARACT EXTRACTION PHACO AND INTRAOCULAR LENS PLACEMENT (IOC);  Surgeon: Leandrew Koyanagi, MD;  Location: [facility] [place];  Service: Ophthalmology;  Laterality: Right;   HPI:  Tina Curtis  is a 84 y.o. female with a known history of chronic kidney disease stage IV, hypertension hyperlipidemia, seasonal allergies and other medical problems is brought in from [facility] with altered mental status.  Urinalysis was found to be abnornal. Patient was pleasantly confused with reported agitation and attempting to get out of bed, requiring sitter. However, patient has improved since [facility] and returning to baseline per patient and family report. Sitter has been discontinued.    Assessment / Plan / Recommendation Clinical Impression   Patient completed cognitive-language evaluation with Brief Cognitive Rating Scale (BCRS) score of 11 and Global Deterioration Scale (GDS) score of 2.2 indicating minimal to mild cognitive communication deficits, specifically in concentration and short term recall. Patient A&O x4, able to answer wh- and y/n questions appropriately, able to sustain attention to simple tasks and following 1-2 step directions.  Pt did demonstrate mild tangential speech during conversation, however minimally affected her ability to sustain conversation with SLP. Family reported an improvement in communication skills and decrease agitation since admission.   Patient and family reported baseline of changes in cognition at home and having "brain blips" or "slips of the tongue" when trying to communicate and idea/opinion/thought. However, patient continues to be independent at home with all iADLs and ADLS per patient/family report (including medication/financial management, driving).   SLP provided education re: ST services to address attention/memory with use of strategies to enhance cognitive-language skills, however patient and family stated preference to initiate services at home in [place] environment if deemed necessary. At this time, skilled ST services not indicated and recommend f/u ST services in home environment if clinically indicated.    SLP Assessment  SLP Recommendation/Assessment: All further Speech Lanaguage Pathology  needs can be addressed in the next venue of care SLP Visit Diagnosis: Cognitive communication deficit (R41.841)    Follow Up Recommendations  Home health SLP    Frequency and Duration   Skilled ST services not indicated at this time. Family and patient stated they would f/u with ST services in [place] environment following discharge if deemed necessary.        SLP Evaluation Cognition  Overall Cognitive Status: History of cognitive impairments - at baseline (per patient and family report) Arousal/Alertness: Awake/alert Orientation Level: Oriented X4 Memory: Appears intact, however reported issues with short term recall Awareness: Appears intact Problem Solving: Appears intact Behaviors: (Tangential speech) Safety/Judgment: Appears intact  Attention: Appears intact for sustain, however requires additional effort for semi-complex divided attention tasks  Comprehension  Auditory  Comprehension Overall Auditory Comprehension: Appears within functional limits for tasks assessed Yes/No Questions: Within Functional Limits Commands: Within Functional Limits Conversation: Complex Interfering Components: Attention;Hearing    Expression Expression Primary Mode of Expression: Verbal Verbal Expression Overall Verbal Expression: Appears within functional limits for tasks assessed Initiation: No impairment Written Expression Written Expression: Not tested   Oral / Motor      GO                   Loni Beckwith, M.S. CCC-SLP Speech-Language Pathologist  Loni Beckwith 02/21/2018, 12:38 PM

## 2018-02-21 NOTE — Consult Note (Signed)
[facility] Consult   Reason for Consult: Follow-up consult for this 84 year old woman with a presentation of delirium Referring Physician: Salary Patient Identification: Tina Curtis MRN:  [id number] Principal Diagnosis: Acute delirium Diagnosis:   Patient Active Problem List   Diagnosis Date Noted  . AKI (acute kidney injury) ([place]) [N17.9] 02/17/2018    Total Time spent with patient: 20 minutes  Subjective:   Tina Curtis is a 84 y.o. female patient admitted with "I am feeling much better".  HPI: Patient seen chart reviewed.  84 year old woman came into the hospital sick and developed a remarkable delirium.  Last time I saw her on Friday she was extremely confused and disoriented.  She was also becoming intermittently agitated and required medication for safety.  Patient was seen by psychiatrist over the weekend as well and may have been oversedated.  Medicines were adjusted.  On interview today the patient is completely oriented.  She is fully oriented to place time and situation.  She is able to tell me what brought her into the hospital and what kind of an illness she had.  Her short-term memory is intact.  No agitation or aggression.  Mood is stated as being good.  Patient and daughter both note that her tremor is improved compared to what it was when she first came to the hospital.  Medical history: Recovering from a urinary tract infection  Social history: Lives at [facility]  Past Psychiatric History: History of depression had been on Wellbutrin and Celexa prior to admission.  These have been continued.  Risk to Self:   Risk to Others:   Prior Inpatient Therapy:   Prior Outpatient Therapy:    Past Medical History:  Past Medical History:  Diagnosis Date  . Arthritis    Gout  . Hypercholesteremia   . Hypertension   . Neuropathy    feet and lower legs  . Seasonal allergies   . Skin cancer    face    Past Surgical History:  Procedure Laterality Date   . ABDOMINAL HYSTERECTOMY  2000  . APPENDECTOMY    . CATARACT EXTRACTION Left   . CATARACT EXTRACTION W/PHACO Right 10/24/2014   Procedure: CATARACT EXTRACTION PHACO AND INTRAOCULAR LENS PLACEMENT (IOC);  Surgeon: Leandrew Koyanagi, MD;  Location: [facility];  Service: Ophthalmology;  Laterality: Right;   Family History: History reviewed. No pertinent family history. Family Psychiatric  History: None Social History:  Social History   Substance and Sexual Activity  Alcohol Use No     Social History   Substance and Sexual Activity  Drug Use Not on file    Social History   Socioeconomic History  . Marital status: Widowed    Spouse name: Not on file  . Number of children: Not on file  . Years of education: Not on file  . Highest education level: Not on file  Occupational History  . Not on file  Social Needs  . Financial resource strain: Not on file  . Food insecurity:    Worry: Not on file    Inability: Not on file  . Transportation needs:    Medical: Not on file    Non-medical: Not on file  Tobacco Use  . Smoking status: Never Smoker  . Smokeless tobacco: Never Used  Substance and Sexual Activity  . Alcohol use: No  . Drug use: Not on file  . Sexual activity: Not on file  Lifestyle  . Physical activity:    Days per  week: Not on file    Minutes per session: Not on file  . Stress: Not on file  Relationships  . Social connections:    Talks on phone: Not on file    Gets together: Not on file    Attends religious service: Not on file    Active member of club or organization: Not on file    Attends meetings of clubs or organizations: Not on file    Relationship status: Not on file  Other Topics Concern  . Not on file  Social History Narrative  . Not on file   Additional Social History:    Allergies:   Allergies  Allergen Reactions  . Penicillins Anaphylaxis    Labs:  Results for orders placed or performed during the hospital encounter of  02/17/18 (from the past 48 hour(s))  Basic metabolic panel     Status: Abnormal   Collection Time: 02/21/18  4:34 AM  Result Value Ref Range   Sodium 138 135 - 145 mmol/L   Potassium 4.2 3.5 - 5.1 mmol/L   Chloride 110 98 - 111 mmol/L   CO2 21 (L) 22 - 32 mmol/L   Glucose, Bld 114 (H) 70 - 99 mg/dL   BUN 41 (H) 8 - 23 mg/dL   Creatinine, Ser 1.92 (H) 0.44 - 1.00 mg/dL   Calcium 8.1 (L) 8.9 - 10.3 mg/dL   GFR calc non Af Amer 22 (L) >60 mL/min   GFR calc Af Amer 26 (L) >60 mL/min    Comment: (NOTE) The eGFR has been calculated using the CKD EPI equation. This calculation has not been validated in all clinical situations. eGFR's persistently <60 mL/min signify possible Chronic Kidney Disease.    Anion gap 7 5 - 15    Comment: Performed at [facility], [address] [place], [place] [address]  CBC     Status: Abnormal   Collection Time: 02/21/18 11:00 AM  Result Value Ref Range   WBC 8.4 4.0 - 10.5 K/uL   RBC 3.43 (L) 3.87 - 5.11 MIL/uL   Hemoglobin 10.9 (L) 12.0 - 15.0 g/dL   HCT 32.8 (L) 36.0 - 46.0 %   MCV 95.6 80.0 - 100.0 fL   MCH 31.8 26.0 - 34.0 pg   MCHC 33.2 30.0 - 36.0 g/dL   RDW 13.5 11.5 - 15.5 %   Platelets 146 (L) 150 - 400 K/uL   nRBC 0.0 0.0 - 0.2 %    Comment: Performed at [facility], [address] [place], [place] [address]    Current Facility-Administered Medications  Medication Dose Route Frequency Provider Last Rate Last Dose  . acetaminophen (TYLENOL) tablet 500 mg  500 mg Oral Q6H PRN Gouru, Aruna, MD   500 mg at 02/21/18 1650  . buPROPion (WELLBUTRIN SR) 12 hr tablet 150 mg  150 mg Oral Daily Chauncey Mann, MD   150 mg at 02/21/18 0848  . Chlorhexidine Gluconate Cloth 2 % PADS 6 each  6 each Topical Q0600 Fritzi Mandes, MD   6 each at 02/21/18 [phone]  . ciprofloxacin (CIPRO) tablet 500 mg  500 mg Oral Q24H Epifanio Lesches, MD   500 mg at 02/21/18 1650  . citalopram (CELEXA) tablet 20 mg  20 mg Oral Daily Gouru, Aruna, MD    20 mg at 02/21/18 0853  . docusate sodium (COLACE) capsule 100 mg  100 mg Oral BID Gouru, Aruna, MD   100 mg at 02/21/18 0853  . enoxaparin (LOVENOX) injection  30 mg  30 mg Subcutaneous Q24H Gouru, Aruna, MD   30 mg at 02/20/18 2318  . famotidine (PEPCID) tablet 20 mg  20 mg Oral QHS Gouru, Aruna, MD   20 mg at 02/20/18 2318  . feeding supplement (GLUCERNA SHAKE) (GLUCERNA SHAKE) liquid 237 mL  237 mL Oral TID BM Fritzi Mandes, MD   237 mL at 02/21/18 1354  . gabapentin (NEURONTIN) capsule 300 mg  300 mg Oral QHS Gouru, Aruna, MD   300 mg at 02/20/18 2317  . haloperidol lactate (HALDOL) injection 1 mg  1 mg Intravenous Q6H PRN Madisyn Mawhinney, Madie Reno, MD   1 mg at 02/19/18 0626  . hydrALAZINE (APRESOLINE) tablet 50 mg  50 mg Oral BID Dustin Flock, MD   50 mg at 02/21/18 0853  . loratadine (CLARITIN) tablet 10 mg  10 mg Oral Daily PRN Gouru, Aruna, MD      . metoprolol succinate (TOPROL-XL) 24 hr tablet 37.5 mg  37.5 mg Oral QHS Gouru, Aruna, MD   37.5 mg at 02/20/18 2317  . multivitamin with minerals tablet 1 tablet  1 tablet Oral Daily Fritzi Mandes, MD   1 tablet at 02/21/18 0853  . mupirocin ointment (BACTROBAN) 2 % 1 application  1 application Nasal BID Fritzi Mandes, MD   1 application at 30/07/62 0849  . ondansetron (ZOFRAN) tablet 4 mg  4 mg Oral Q6H PRN Gouru, Aruna, MD       Or  . ondansetron (ZOFRAN) injection 4 mg  4 mg Intravenous Q6H PRN Gouru, Aruna, MD      . pantoprazole (PROTONIX) EC tablet 40 mg  40 mg Oral QAC breakfast Gouru, Aruna, MD   40 mg at 02/21/18 0853  . polyethylene glycol (MIRALAX / GLYCOLAX) packet 17 g  17 g Oral Daily PRN Epifanio Lesches, MD      . risperiDONE (RISPERDAL M-TABS) disintegrating tablet 0.5 mg  0.5 mg Oral BID PRN Chauncey Mann, MD   0.5 mg at 02/20/18 2318  . scopolamine (TRANSDERM-SCOP) 1 MG/3DAYS 1.5 mg  1 patch Transdermal Q72H Epifanio Lesches, MD   1.5 mg at 02/21/18 1354  . simvastatin (ZOCOR) tablet 20 mg  20 mg Oral QPM Gouru, Aruna, MD   20  mg at 02/21/18 1652  . traZODone (DESYREL) tablet 25 mg  25 mg Oral QHS Chauncey Mann, MD   25 mg at 02/20/18 2318    Musculoskeletal: Strength & Muscle Tone: within normal limits Gait & Station: unsteady Patient leans: N/A  Psychiatric Specialty Exam: Physical Exam  ROS  Blood pressure (!) 189/76, pulse 62, temperature (!) 97.5 F (36.4 C), temperature source Oral, resp. rate 17, height '5\' 4"'  (1.626 m), weight 66.2 kg, SpO2 97 %.Body mass index is 25.06 kg/m.  General Appearance: Fairly Groomed  Eye Contact:  Good  Speech:  Clear and Coherent  Volume:  Normal  Mood:  Euthymic  Affect:  Congruent  Thought Process:  Coherent  Orientation:  Full (Time, Place, and Person)  Thought Content:  Logical  Suicidal Thoughts:  No  Homicidal Thoughts:  No  Memory:  Immediate;   Fair Recent;   Fair Remote;   Fair  Judgement:  Fair  Insight:  Fair  Psychomotor Activity:  Normal  Concentration:  Concentration: Fair  Recall:  [employer] of Knowledge:  Fair  Language:  Fair  Akathisia:  No  Handed:  Right  AIMS (if indicated):     Assets:  Desire for [place]  ADL's:  Intact  Cognition:  WNL  Sleep:        Treatment Plan Summary: Plan Patient appears to be improved and is nearly back to her baseline.  No sign at all of delirium.  Antipsychotics and sedating medicines have been taken down to as needed only.  Patient should not need these at the time of discharge.  Continue her antidepressants as she was taking them before.  She and the daughter both say they think the preparation of antidepressant specifically Wellbutrin that she is taking now is doing a better job with her tremor.  They ask if she can be on the same formula when she is discharged.  I think that should be no problem at all.  Disposition: No evidence of imminent risk to self or others at present.   Patient does not meet criteria for psychiatric inpatient admission. Supportive therapy  provided about ongoing stressors.  Alethia Berthold, MD 02/21/2018 5:33 PM

## 2018-02-21 NOTE — Progress Notes (Signed)
Spoke to Dr Vianne Bulls regarding pt's 1:1 sitter. Pt is alert and oriented x3. May d/c sitter at this time.

## 2018-02-21 NOTE — Progress Notes (Signed)
[facility] at [facility]                                                                                                                                                                                  Patient Demographics   Tina Curtis, is a 84 y.o. female, [name] - 1926/06/20, MOQ:[id number]  Admit date - 02/17/2018   Admitting Physician Nicholes Mango, MD  Outpatient Primary MD for the patient is Derinda Late, MD   LOS - 4  Subjective: Much more alert, awake, oriented, sitting and eating lunch.  And very coherent and seen walking with therapist. Review of Systems:   CONSTITUTIONAL: Alert, awake Cardiovascular; no chest pain no palpitations Pulmonary: No shortness of breath, GI: No nausea, vomiting, has constipation no BM for 4 days. Neurologically: Denies any headache, dizziness.  Psychiatric: No hallucinations.  Vitals:   Vitals:   02/21/18 0800 02/21/18 0828 02/21/18 1123 02/21/18 1230  BP: (!) 173/75 (!) 170/76 (!) 150/76 (!) 189/76  Pulse: 66  67 62  Resp: 17     Temp: 97.9 F (36.6 C)  97.9 F (36.6 C) (!) 97.5 F (36.4 C)  TempSrc: Oral  Oral Oral  SpO2: 95%   97%  Weight:      Height:        Wt Readings from Last 3 Encounters:  02/17/18 66.2 kg  10/24/14 65.3 kg     Intake/Output Summary (Last 24 hours) at 02/21/2018 1402 Last data filed at 02/21/2018 1400 Gross per 24 hour  Intake 1461.21 ml  Output 1150 ml  Net 311.21 ml    Physical Exam:   GENERAL: Pleasant-appearing in no apparent distress.   HEAD, EYES, EARS, NOSE AND THROAT: Atraumatic, normocephalic. Extraocular muscles are intact. Pupils equal and reactive to light. Sclerae anicteric. No conjunctival injection. No oro-pharyngeal erythema.  NECK: Supple. There is no jugular venous distention. No bruits, no lymphadenopathy, no thyromegaly.  HEART: Regular rate and rhythm,. No murmurs, no rubs, no clicks.  LUNGS: Clear to auscultation bilaterally. No rales or rhonchi.  No wheezes.  ABDOMEN: Soft, flat, nontender, nondistended. Has good bowel sounds. No hepatosplenomegaly appreciated.  EXTREMITIES: No evidence of any cyanosis, clubbing, or peripheral edema.  +2 pedal and radial pulses bilaterally.  NEUROLOGIC: Patient alert, awake, oriented, cranial nerves II 12 intact, power 5 x 5 upper and lower extremities, sensations are intact, DTR 2+ bilaterally.  SKIN: Moist and warm with no rashes appreciated.  Psych: Alert, awake, oriented, sitting in chair and having lunch. LN: No inguinal LN enlargement    Antibiotics   Anti-infectives (From admission, onward)   Start     Dose/Rate Route Frequency Ordered Stop  02/21/18 1400  ciprofloxacin (CIPRO) tablet 500 mg     500 mg Oral Every 24 hours 02/21/18 1358     02/19/18 2200  levofloxacin (LEVAQUIN) IVPB 500 mg  Status:  Discontinued     500 mg 100 mL/hr over 60 Minutes Intravenous Every 48 hours 02/17/18 1904 02/18/18 1313   02/19/18 1000  cefTRIAXone (ROCEPHIN) 1 g in sodium chloride 0.9 % 100 mL IVPB  Status:  Discontinued     1 g 200 mL/hr over 30 Minutes Intravenous Every 24 hours 02/18/18 1313 02/21/18 1358   02/17/18 2200  levofloxacin (LEVAQUIN) IVPB 750 mg     750 mg 100 mL/hr over 90 Minutes Intravenous  Once 02/17/18 1904 02/17/18 2340   02/17/18 1900  levofloxacin (LEVAQUIN) IVPB 500 mg  Status:  Discontinued     500 mg 100 mL/hr over 60 Minutes Intravenous Every 24 hours 02/17/18 1845 02/17/18 1904   02/17/18 1845  cefTRIAXone (ROCEPHIN) 1 g in sodium chloride 0.9 % 100 mL IVPB     1 g 200 mL/hr over 30 Minutes Intravenous  Once 02/17/18 1838 02/17/18 1953      Medications   Scheduled Meds: . buPROPion  150 mg Oral Daily  . Chlorhexidine Gluconate Cloth  6 each Topical Q0600  . ciprofloxacin  500 mg Oral Q24H  . citalopram  20 mg Oral Daily  . docusate sodium  100 mg Oral BID  . enoxaparin (LOVENOX) injection  30 mg Subcutaneous Q24H  . famotidine  20 mg Oral QHS  . feeding supplement  (GLUCERNA SHAKE)  237 mL Oral TID BM  . gabapentin  300 mg Oral QHS  . hydrALAZINE  50 mg Oral BID  . metoprolol succinate  37.5 mg Oral QHS  . multivitamin with minerals  1 tablet Oral Daily  . mupirocin ointment  1 application Nasal BID  . pantoprazole  40 mg Oral QAC breakfast  . scopolamine  1 patch Transdermal Q72H  . simvastatin  20 mg Oral QPM  . traZODone  25 mg Oral QHS   Continuous Infusions: . sodium chloride 75 mL/hr at 02/21/18 0351   PRN Meds:.acetaminophen, haloperidol lactate, loratadine, ondansetron **OR** ondansetron (ZOFRAN) IV, polyethylene glycol, risperiDONE   Data Review:   Micro Results Recent Results (from the past 240 hour(s))  Urine culture     Status: Abnormal   Collection Time: 02/17/18  4:29 PM  Result Value Ref Range Status   Specimen Description   Final    URINE, RANDOM Performed at [facility], [address] [place], [place] [address]    Special Requests   Final    NONE Performed at [facility], [address] [place], [place] [address]    Culture (A)  Final    >=100,000 COLONIES/mL ESCHERICHIA COLI Confirmed Extended Spectrum Beta-Lactamase Producer (ESBL).  In bloodstream infections from ESBL organisms, carbapenems are preferred over piperacillin/tazobactam. They are shown to have a lower risk of mortality.    Report Status 02/21/2018 FINAL  Final   Organism ID, Bacteria ESCHERICHIA COLI (A)  Final      Susceptibility   Escherichia coli - MIC*    AMPICILLIN >=32 RESISTANT Resistant     CEFAZOLIN 16 SENSITIVE Sensitive     CEFTRIAXONE <=1 SENSITIVE Sensitive     CIPROFLOXACIN <=0.25 SENSITIVE Sensitive     GENTAMICIN <=1 SENSITIVE Sensitive     IMIPENEM <=0.25 SENSITIVE Sensitive     NITROFURANTOIN <=16 SENSITIVE Sensitive     TRIMETH/SULFA <=20 SENSITIVE Sensitive  AMPICILLIN/SULBACTAM >=32 RESISTANT Resistant     PIP/TAZO >=128 RESISTANT Resistant     Extended ESBL POSITIVE Resistant     * >=100,000  COLONIES/mL ESCHERICHIA COLI  MRSA PCR Screening     Status: Abnormal   Collection Time: 02/17/18  8:13 PM  Result Value Ref Range Status   MRSA by PCR POSITIVE (A) NEGATIVE Final    Comment:        The GeneXpert MRSA Assay (FDA approved for NASAL specimens only), is one component of a comprehensive MRSA colonization surveillance program. It is not intended to diagnose MRSA infection nor to guide or monitor treatment for MRSA infections. RESULT CALLED TO, READ BACK BY AND VERIFIED WITH: C/YASMIN FUENTES @2240  02/17/18 [facility] Performed at [facility], [address] [place], [place] [address]     Radiology Reports Ct Head Wo Contrast  Result Date: 02/17/2018 CLINICAL DATA:  Intermittent confusion over the last 1 month. EXAM: CT HEAD WITHOUT CONTRAST TECHNIQUE: Contiguous axial images were obtained from the base of the skull through the vertex without intravenous contrast. COMPARISON:  None. FINDINGS: Brain: Mild generalized atrophy and white matter disease are present. Scattered hypoattenuation is present in the basal ganglia, likely remote. A CSF density lacunar infarct is present in the left lentiform nucleus. The brainstem is within normal limits. A remote lacunar infarct is present in the medial right cerebellum on image 7. No acute cortical infarct, hemorrhage, or mass lesion is present. Vascular: Atherosclerotic calcifications are present within the cavernous internal carotid arteries bilaterally. There is no hyperdense vessel. Skull: Calvarium is intact. No focal lytic or blastic lesion is present. Sinuses/Orbits: The paranasal sinuses and mastoid air cells are clear. Bilateral lens replacements are present. Globes and orbits are otherwise within normal limits. IMPRESSION: 1. No acute or focal lesion to explain the patient's intermittent confusion. 2. Generalized atrophy and white matter disease likely reflects the sequela of chronic microvascular ischemia. 3. Lacunar infarcts  of the basal ganglia bilaterally appear remote. 4. Atherosclerosis. Electronically Signed   By: San Morelle M.D.   On: 02/17/2018 17:25     CBC Recent Labs  Lab 02/17/18 1607 02/18/18 0612 02/21/18 1100  WBC 8.9 8.2 8.4  HGB 12.1 10.9* 10.9*  HCT 35.7* 31.9* 32.8*  PLT 165 138* 146*  MCV 93.7 94.7 95.6  MCH 31.8 32.3 31.8  MCHC 33.9 34.2 33.2  RDW 13.2 13.1 13.5    Chemistries  Recent Labs  Lab 02/17/18 1607 02/18/18 0612 02/19/18 1100 02/21/18 0434  NA 136 137 141 138  K 5.0 4.5 4.3 4.2  CL 105 111 113* 110  CO2 20* 20* 19* 21*  GLUCOSE 117* 88 89 114*  BUN 70* 62* 47* 41*  CREATININE 3.20* 2.42* 2.08* 1.92*  CALCIUM 9.5 8.6* 9.2 8.1*  AST 32 35  --   --   ALT 20 19  --   --   ALKPHOS 92 78  --   --   BILITOT 0.7 0.7  --   --    ------------------------------------------------------------------------------------------------------------------ estimated creatinine clearance is 19 mL/min (A) (by C-G formula based on SCr of 1.92 mg/dL (H)). ------------------------------------------------------------------------------------------------------------------ No results for input(s): HGBA1C in the last 72 hours. ------------------------------------------------------------------------------------------------------------------ No results for input(s): CHOL, HDL, LDLCALC, TRIG, CHOLHDL, LDLDIRECT in the last 72 hours. ------------------------------------------------------------------------------------------------------------------ No results for input(s): TSH, T4TOTAL, T3FREE, THYROIDAB in the last 72 hours.  Invalid input(s): FREET3 ------------------------------------------------------------------------------------------------------------------ No results for input(s): VITAMINB12, FOLATE, FERRITIN, TIBC, IRON, RETICCTPCT in the last 72 hours.  Coagulation  profile No results for input(s): INR, PROTIME in the last 168 hours.  No results for input(s): DDIMER in the  last 72 hours.  Cardiac Enzymes Recent Labs  Lab 02/17/18 1607  TROPONINI <0.03   ------------------------------------------------------------------------------------------------------------------ Invalid input(s): POCBNP    Assessment & Plan   #Delirium from acute kidney injury and UTI  acute delirium improved, use Risperdal as needed, continue Celexa, Wellbutrin changed to SR, appreciate psychiatry helping, patient is very alert, awake, oriented today.   #Acute kidney injury on chronic kidney disease stage 3 Avoid nephrotoxins Holding Hyzaar and allopurinol Acute renal failure improved, patient back to baseline kidney function, discontinue IV fluids, encourage p.o. intake.  #E. coli UTI and resistant to multiple antibiotics, spoke with pharmacist, start on Cipro in preparation for discharge tomorrow patient can be discharged home with Cipro orally.    #Dizziness from dehydration Patient positive for orthostatic hypotension, advised to use compression stockings, patient has been dealing with chronic nausea, started on scopolamine patch by PCP recently, because patient has chronic nausea can continue scopolamine patch.  #Essential hypertension Continue hydralazine and Toprol-XL , #Hyperlipidemia continue home medication Zocor  #Gout ;no exacerbation at this time Hold allopurinol  DVT prophylaxis Lovenox subcu renal dose adjusted GI prophylaxis with Protonix  Spoke with patient's daughter at bedside, more than 50% time spent in counseling, coordination of care    Code Status Orders  (From admission, onward)         Start     Ordered   02/17/18 1858  Do not attempt resuscitation (DNR)  Continuous    Question Answer Comment  In the event of cardiac or respiratory ARREST Do not call a "code blue"   In the event of cardiac or respiratory ARREST Do not perform Intubation, CPR, defibrillation or ACLS   In the event of cardiac or respiratory ARREST Use medication by  any route, position, wound care, and other measures to relive pain and suffering. May use oxygen, suction and manual treatment of airway obstruction as needed for comfort.   Comments RN MAY PRONOUNCE      02/17/18 1857        Code Status History    This patient has a current code status but no historical code status.    Advance Directive Documentation     Most Recent Value  Type of Advance Directive  Healthcare Power of [place], Out of facility DNR (pink MOST or yellow form)  Pre-existing out of facility DNR order (yellow form or pink MOST form)  Yellow form placed in chart (order not valid for inpatient use)  "MOST" Form in Place?  -           Consults ' psychiatriy   DVT Prophylaxis  Lovenox  Lab Results  Component Value Date   PLT 146 (L) 02/21/2018     Time Spent in minutes;40 minutes  greater than 50% of time spent in care coordination and counseling patient regarding the condition and plan of care. Likely discharge tomorrow with home health PT, RN.  Epifanio Lesches M.D on 02/21/2018 at 2:02 PM  Between 7am to 6pm - Pager - [phone]  After 6pm go to [email or web address] - Proofreader  [employer]   Office  [phone]

## 2018-02-21 NOTE — Evaluation (Signed)
Physical Therapy Evaluation Patient Details Name: Tina Curtis MRN: [id number] [name]: Dec 21, 1926 Today's Date: 02/21/2018   History of Present Illness  Pt is a 84 y/o F who presented with AMS, nausea, and dizziness. Pt with delirium due to acute kidney injury and UTI. Pt's PMH inculdes gout, neuropathy, skin cancer.      Clinical Impression  Pt admitted with above diagnosis. Pt currently with functional limitations due to the deficits listed below (see PT Problem List). Ms. Cirigliano demonstrates weakness in BLEs compared to her baseline which requires her to use RW during session today to ambulate due to imbalance.  Pt steady when ambulating with RW and was instructed to begin using rollator when ambulating.  Pt additionally positive with orthostatic testing and pt educated on techniques to dec fall risk with these symptoms.  Pt will benefit from skilled PT to increase their independence and safety with mobility to allow discharge to the venue listed below.      Follow Up Recommendations Home health PT;Supervision/Assistance - 24 hour    Equipment Recommendations  None recommended by PT(has rollator at home)    Recommendations for Other Services OT consult(for safety with ADLs)     Precautions / Restrictions Precautions Precautions: Fall;Other (comment) Precaution Comments: +orthostatics Restrictions Weight Bearing Restrictions: No      Mobility  Bed Mobility Overal bed mobility: Needs Assistance Bed Mobility: Supine to Sit     Supine to sit: Min guard;HOB elevated     General bed mobility comments: Increased time and effort but no physical assist or cues needed.   Transfers Overall transfer level: Needs assistance Equipment used: Rolling walker (2 wheeled) Transfers: Sit to/from Stand Sit to Stand: Min guard;From elevated surface         General transfer comment: Pt requires bed to be elevated for sit>stand.  Pt then stood from [facility] with min guard assist x2.     Ambulation/Gait Ambulation/Gait assistance: Min guard Gait Distance (Feet): 100 Feet Assistive device: Rolling walker (2 wheeled);None Gait Pattern/deviations: Step-through pattern;Decreased step length - right;Decreased step length - left Gait velocity: decreased   General Gait Details: Pt unsteady and reaching out for support when ambulating 5 ft in room.  Improved steadiness and no LOB with introduction of RW.   Stairs            Wheelchair Mobility    Modified Rankin (Stroke Patients Only)       Balance Overall balance assessment: Needs assistance;History of Falls Sitting-balance support: No upper extremity supported;Feet supported Sitting balance-Leahy Scale: Good     Standing balance support: During functional activity;No upper extremity supported Standing balance-Leahy Scale: Fair Standing balance comment: Pt able to stand statically without UE support but relies on RW for dynamic activities                             Pertinent Vitals/Pain Pain Assessment: No/denies pain Faces Pain Scale: Hurts little more Pain Location: L ankle (resolves with ambulation), L knee, Bil hips(h/o arthritis Bil hips, knees, ankles) Pain Descriptors / Indicators: Discomfort Pain Intervention(s): Limited activity within patient's tolerance;Monitored during session;Repositioned    Home Living Family/patient expects to be discharged to:: Private residence Living Arrangements: Alone Available Help at Discharge: Neighbor;Friend(s);Available PRN/intermittently Type of Home: Independent living facility Home Access: Level entry     Home Layout: One level Home Equipment: Walker - 4 wheels      Prior Function Level of Independence: Independent  Comments: Ind at baseline for ADLs, IADLs.  Pt ambulates without AD.  Reports 2 falls in the past 6 months.      Hand Dominance        Extremity/Trunk Assessment   Upper Extremity Assessment Upper Extremity  Assessment: (BUE strength grossly 4-/5)    Lower Extremity Assessment Lower Extremity Assessment: (BLE strength grossly 3+/5)       Communication   Communication: HOH  Cognition Arousal/Alertness: Awake/alert Behavior During Therapy: WFL for tasks assessed/performed Overall Cognitive Status: History of cognitive impairments - at baseline                                        General Comments General comments (skin integrity, edema, etc.): Pt with +orthostatics: in supine BP 158/68, sitting 160/106, standing 135/75.  Two daughters present during evaluation.     Exercises Other Exercises Other Exercises: Encouraged pt to ambulate with nursing staff at least 3x/day   Assessment/Plan    PT Assessment Patient needs continued PT services  PT Problem List Decreased strength;Decreased balance;Decreased knowledge of use of DME       PT Treatment Interventions DME instruction;Gait training;Functional mobility training;Therapeutic activities;Therapeutic exercise;Balance training;Neuromuscular re-education;Patient/family education    PT Goals (Current goals can be found in the Care Plan section)  Acute Rehab PT Goals Patient Stated Goal: to return to PLOF PT Goal Formulation: With patient Time For Goal Achievement: 03/07/18 Potential to Achieve Goals: Good    Frequency Min 2X/week   Barriers to discharge Decreased caregiver support However, daughters report that they will be able to hire caregiver assist    Co-evaluation               AM-PAC PT "6 Clicks" Daily Activity  Outcome Measure Difficulty turning over in bed (including adjusting bedclothes, sheets and blankets)?: A Little Difficulty moving from lying on back to sitting on the side of the bed? : A Little Difficulty sitting down on and standing up from a chair with arms (e.g., wheelchair, bedside commode, etc,.)?: A Little Help needed moving to and from a bed to chair (including a wheelchair)?: A  Little Help needed walking in hospital room?: A Little Help needed climbing 3-5 steps with a railing? : A Little 6 Click Score: 18    End of Session Equipment Utilized During Treatment: Gait belt Activity Tolerance: Patient tolerated treatment well Patient left: in chair;with call bell/phone within reach;with nursing/sitter in room;with family/visitor present(sitter in room) Nurse Communication: Mobility status;Other (comment)(+orthostatics) PT Visit Diagnosis: Unsteadiness on feet (R26.81);History of falling (Z91.81);Muscle weakness (generalized) (M62.81)    Time: 3716-9678 PT Time Calculation (min) (ACUTE ONLY): 41 min   Charges:   PT Evaluation $PT Eval Low Complexity: 1 Low PT Treatments $Gait Training: 8-22 mins $Therapeutic Activity: 8-22 mins        Collie Siad PT, DPT 02/21/2018, 2:50 PM

## 2018-02-22 MED ORDER — TRAZODONE HCL 50 MG PO TABS: 25.0000 mg | ORAL_TABLET | Freq: Every day | ORAL | 0 refills | Status: DC

## 2018-02-22 MED ORDER — CIPROFLOXACIN HCL 500 MG PO TABS: 500.0000 mg | ORAL_TABLET | ORAL | 0 refills | Status: DC

## 2018-02-22 MED ORDER — RISPERIDONE 0.5 MG PO TBDP: 0.5000 mg | ORAL_TABLET | Freq: Two times a day (BID) | ORAL | 0 refills | Status: DC | PRN

## 2018-02-22 MED ORDER — GLUCERNA SHAKE PO LIQD: 237.0000 mL | Freq: Three times a day (TID) | ORAL | 0 refills | Status: DC

## 2018-02-22 MED ORDER — BUPROPION HCL ER (SR) 150 MG PO TB12: 150.0000 mg | ORAL_TABLET | Freq: Every day | ORAL | 0 refills | Status: DC

## 2018-02-22 MED ORDER — HYDRALAZINE HCL 50 MG PO TABS: 50.0000 mg | ORAL_TABLET | Freq: Two times a day (BID) | ORAL | 0 refills | Status: DC

## 2018-02-22 MED ORDER — MUPIROCIN 2 % EX OINT: 1.0000 "application " | TOPICAL_OINTMENT | Freq: Two times a day (BID) | CUTANEOUS | 0 refills | Status: DC

## 2018-02-22 NOTE — Progress Notes (Signed)
Discharge teaching given to patient, patient verbalized understanding and had no questions. Patient IV removed. Patient will be transported home by family. All patient belongings gathered prior to leaving.  

## 2018-02-22 NOTE — Progress Notes (Signed)
Patient is feeling better, sitting in the chair, denies any complaints, stable for discharge discussed the plan with patient's daughter patient can go home with home health physical therapy.  Patient is from [facility] and she wants to go to [facility] back instead of rehab. Discharge instructions on the computer Assessment and plan E. coli UTI Acute delirium: Improved will be on Celexa, Wellbutrin short-acting release, PRN Risperdal, Cipro oral for 7 days, continue Bactroban for 1 more day to finish total of 5 days of nasal ointment.  Discharge home with home health PT, OT, Because the plan with patient's daughter extensively, follow blood pressure patient will go up on hydralazine and discontinue losartan until seen by PCP because of recovery from renal insufficiency at this hospitalization.  This has been discussed with patient and also patient's daughter. Total time spent on this encounter is more than 30 minutes today.

## 2018-02-22 NOTE — Care Management Note (Signed)
Case Management Note  Patient Details  Name: Tina Curtis MRN: [id number] Date of Birth: 1926-08-25   Patient to discharge today.  Patient to return to [facility].  Daughter and neighbor at bedside.  PT has assessed patient and recommends home health PT.  Patient and daughter agreeable to services. They both states they do not have a preference of home health agency.  Referral made to [facility] with [place].  Patient states that she has a rollator and shower seat in the home.  Daughter states that she is visiting from [facility], and will be staying with the patient thru Saturday.  Provided PCS list to daughter as an additional resource. RNCM signing off.   Subjective/Objective:                    Action/Plan:   Expected Discharge Date:  02/22/18               Expected Discharge Plan:  [place]  In-House Referral:     Discharge planning Services  CM Consult  Post Acute Care Choice:  Home Health Choice offered to:  Patient, Adult Children  DME Arranged:    DME Agency:     HH Arranged:  RN, PT, OT, Nurse's Aide, Social Work [employer] Agency:  [facility]  Status of Service:  Completed, signed off  If discussed at [employer], dates discussed:    Additional Comments:  Beverly Sessions, RN 02/22/2018, 2:20 PM

## 2018-02-25 NOTE — Discharge Summary (Signed)
Tina Curtis, is a 84 y.o. female  [name] 28-May-1926  MRN [id number].  Admission date:  02/17/2018  Admitting Physician  Nicholes Mango, MD  Discharge Date:  02/22/2018   Primary MD  Derinda Late, MD  Recommendations for primary care physician for things to follow:  Follow-up with PCP in 1 week   Admission Diagnosis  Acute on chronic renal insufficiency [N28.9, N18.9] Urinary tract infection without hematuria, site unspecified [N39.0] Altered mental status, unspecified altered mental status type [R41.82]   Discharge Diagnosis  Acute on chronic renal insufficiency [N28.9, N18.9] Urinary tract infection without hematuria, site unspecified [N39.0] Altered mental status, unspecified altered mental status type [R41.82]    Active Problems:   AKI (acute kidney injury) ([place])      Past Medical History:  Diagnosis Date  . Arthritis    Gout  . Hypercholesteremia   . Hypertension   . Neuropathy    feet and lower legs  . Seasonal allergies   . Skin cancer    face    Past Surgical History:  Procedure Laterality Date  . ABDOMINAL HYSTERECTOMY  2000  . APPENDECTOMY    . CATARACT EXTRACTION Left   . CATARACT EXTRACTION W/PHACO Right 10/24/2014   Procedure: CATARACT EXTRACTION PHACO AND INTRAOCULAR LENS PLACEMENT (IOC);  Surgeon: Leandrew Koyanagi, MD;  Location: [facility];  Service: Ophthalmology;  Laterality: Right;       History of present illness and  Hospital Course:     Kindly see H&P for history of present illness and admission details, please review complete Labs, Consult reports and Test reports for all details in brief  HPI  from the history and physical done on the day of admission 84 year old female patient admitted because of confusion, found to have UTI and admitted for the same.   Hospital  Course  Acute delirium secondary to dehydration, UTI, acute delirium improved, seen by psychiatric, patient was very agitated, required a sitter and also seen by psychiatry, received Risperdal, Wellbutrin also is changed to SR from long-acting.  Patient acute delirium improved after she received fluids, antibiotics.  #2 E. coli UTI resistant to multiple antibiotics, spoke with pharmacist, discharged with Cipro 500 mg daily, received IV Rocephin in the hospital.  #3/essential hypertension: Controlled, continue hydralazine, Toprol-XL, discontinue Hyzaar at discharge, increase the dose of hydralazine at discharge, discussed with patient's daughter. 4.  Acute kidney injury on top of CKD stage IV: Patient family does not know that she has kidney problem, I get medical records from PCP and told her daughter that patient already has chronic kidney problem, we held her Hyzaar, allopurinol, received IV fluids, her kidney function improved.  #5 deconditioning, patient is from [facility] discharge to home with physical therapy. 6.  Patient daughters are all aware of her discharge medications, discussed the plan with patient's daughters who are at bedside all the time with the patient. Discharge Condition: stable   Follow UP  Follow-up Information    Derinda Late, MD. Schedule an appointment as soon as possible for a visit in 1 week(s).   Specialty:  Family Medicine Contact information: [address] [facility] [place] [address] [phone]             Discharge Instructions  and  Discharge Medications      Allergies as of 02/22/2018      Reactions   Penicillins Anaphylaxis      Medication List    STOP  taking these medications   buPROPion 150 MG 24 hr tablet Commonly known as:  WELLBUTRIN XL Replaced by:  buPROPion 150 MG 12 hr tablet   losartan 50 MG tablet Commonly known as:  COZAAR   losartan-hydrochlorothiazide 100-25 MG  tablet Commonly known as:  HYZAAR     TAKE these medications   acetaminophen 500 MG tablet Commonly known as:  TYLENOL Take 500 mg by mouth every 6 (six) hours as needed.   ALIGN PO Take by mouth. AM   allopurinol 300 MG tablet Commonly known as:  ZYLOPRIM Take 300 mg by mouth daily. AM   buPROPion 150 MG 12 hr tablet Commonly known as:  WELLBUTRIN SR Take 1 tablet (150 mg total) by mouth daily. Replaces:  buPROPion 150 MG 24 hr tablet   ciprofloxacin 500 MG tablet Commonly known as:  CIPRO Take 1 tablet (500 mg total) by mouth daily.   citalopram 20 MG tablet Commonly known as:  CELEXA Take 20 mg by mouth daily.   feeding supplement (GLUCERNA SHAKE) Liqd Take 237 mLs by mouth 3 (three) times daily between meals.   gabapentin 300 MG capsule Commonly known as:  NEURONTIN Take 300 mg by mouth at bedtime.   hydrALAZINE 50 MG tablet Commonly known as:  APRESOLINE Take 1 tablet (50 mg total) by mouth 2 (two) times daily. What changed:    medication strength  how much to take   ICAPS PO Take by mouth daily.   loratadine 10 MG tablet Commonly known as:  CLARITIN Take 10 mg by mouth daily as needed for allergies. AM   metoprolol succinate 25 MG 24 hr tablet Commonly known as:  TOPROL-XL Take 37.5 mg by mouth at bedtime.   mupirocin ointment 2 % Commonly known as:  BACTROBAN Place 1 application into the nose 2 (two) times daily. Pt needs for one more day.   risperiDONE 0.5 MG disintegrating tablet Commonly known as:  RISPERDAL M-TABS Take 1 tablet (0.5 mg total) by mouth 2 (two) times daily as needed (psychosis).   simvastatin 20 MG tablet Commonly known as:  ZOCOR Take 20 mg by mouth daily. PM   traZODone 50 MG tablet Commonly known as:  DESYREL Take 0.5 tablets (25 mg total) by mouth at bedtime.   VITAMIN D PO Take by mouth.         Diet and Activity recommendation: See Discharge Instructions above   Consults obtained ; psychiatry, physical  therapy Major procedures and Radiology Reports - PLEASE review detailed and final reports for all details, in brief -     Ct Head Wo Contrast  Result Date: 02/17/2018 CLINICAL DATA:  Intermittent confusion over the last 1 month. EXAM: CT HEAD WITHOUT CONTRAST TECHNIQUE: Contiguous axial images were obtained from the base of the skull through the vertex without intravenous contrast. COMPARISON:  None. FINDINGS: Brain: Mild generalized atrophy and white matter disease are present. Scattered hypoattenuation is present in the basal ganglia, likely remote. A CSF density lacunar infarct is present in the left lentiform nucleus. The brainstem is within normal limits. A remote lacunar infarct is present in the medial right cerebellum on image 7. No acute cortical infarct, hemorrhage, or mass lesion is present. Vascular: Atherosclerotic calcifications are present within the cavernous internal carotid arteries bilaterally. There is no hyperdense vessel. Skull: Calvarium is intact. No focal lytic or blastic lesion is present. Sinuses/Orbits: The paranasal sinuses and mastoid air cells are clear. Bilateral lens replacements are present. Globes and orbits are otherwise within  normal limits. IMPRESSION: 1. No acute or focal lesion to explain the patient's intermittent confusion. 2. Generalized atrophy and white matter disease likely reflects the sequela of chronic microvascular ischemia. 3. Lacunar infarcts of the basal ganglia bilaterally appear remote. 4. Atherosclerosis. Electronically Signed   By: San Morelle M.D.   On: 02/17/2018 17:25    Micro Results     Recent Results (from the past 240 hour(s))  Urine culture     Status: Abnormal   Collection Time: 02/17/18  4:29 PM  Result Value Ref Range Status   Specimen Description   Final    URINE, RANDOM Performed at [facility], [address] [place], [place] [address]    Special Requests   Final    NONE Performed at [facility], [address] [place], [place] [address]    Culture (A)  Final    >=100,000 COLONIES/mL ESCHERICHIA COLI Confirmed Extended Spectrum Beta-Lactamase Producer (ESBL).  In bloodstream infections from ESBL organisms, carbapenems are preferred over piperacillin/tazobactam. They are shown to have a lower risk of mortality.    Report Status 02/21/2018 FINAL  Final   Organism ID, Bacteria ESCHERICHIA COLI (A)  Final      Susceptibility   Escherichia coli - MIC*    AMPICILLIN >=32 RESISTANT Resistant     CEFAZOLIN 16 SENSITIVE Sensitive     CEFTRIAXONE <=1 SENSITIVE Sensitive     CIPROFLOXACIN <=0.25 SENSITIVE Sensitive     GENTAMICIN <=1 SENSITIVE Sensitive     IMIPENEM <=0.25 SENSITIVE Sensitive     NITROFURANTOIN <=16 SENSITIVE Sensitive     TRIMETH/SULFA <=20 SENSITIVE Sensitive     AMPICILLIN/SULBACTAM >=32 RESISTANT Resistant     PIP/TAZO >=128 RESISTANT Resistant     Extended ESBL POSITIVE Resistant     * >=100,000 COLONIES/mL ESCHERICHIA COLI  MRSA PCR Screening     Status: Abnormal   Collection Time: 02/17/18  8:13 PM  Result Value Ref Range Status   MRSA by PCR POSITIVE (A) NEGATIVE Final    Comment:        The GeneXpert MRSA Assay (FDA approved for NASAL specimens only), is one component of a comprehensive MRSA colonization surveillance program. It is not intended to diagnose MRSA infection nor to guide or monitor treatment for MRSA infections. RESULT CALLED TO, READ BACK BY AND VERIFIED WITH: C/YASMIN FUENTES @2240  02/17/18 [facility] Performed at [facility], [address] [place], [place] [address]        Today   Subjective:   Cera Rorke today has no headache,no chest abdominal pain,no new weakness tingling or numbness, feels much better wants to go home today.   Objective:   Blood pressure (!) 180/70, pulse 62, temperature 97.9 F (36.6 C), resp. rate 17, height 5\' 4"  (1.626 m), weight 66.2 kg, SpO2 98 %.  No intake or output data in the 24  hours ending 02/25/18 1413  Exam Awake Alert, Oriented x 3, No new F.N deficits, Normal affect [place].AT,PERRAL Supple Neck,No JVD, No cervical lymphadenopathy appriciated.  Symmetrical Chest wall movement, Good air movement bilaterally, CTAB RRR,No Gallops,Rubs or new Murmurs, No Parasternal Heave +ve B.Sounds, Abd Soft, Non tender, No organomegaly appriciated, No rebound -guarding or rigidity. No Cyanosis, Clubbing or edema, No new Rash or bruise  Data Review   CBC w Diff:  Lab Results  Component Value Date   WBC 8.4 02/21/2018   HGB 10.9 (L) 02/21/2018   HCT 32.8 (L) 02/21/2018   PLT 146 (L) 02/21/2018  CMP:  Lab Results  Component Value Date   NA 138 02/21/2018   K 4.2 02/21/2018   CL 110 02/21/2018   CO2 21 (L) 02/21/2018   BUN 41 (H) 02/21/2018   CREATININE 1.92 (H) 02/21/2018   PROT 6.6 02/18/2018   ALBUMIN 3.9 02/18/2018   BILITOT 0.7 02/18/2018   ALKPHOS 78 02/18/2018   AST 35 02/18/2018   ALT 19 02/18/2018  .   Total Time in preparing paper work, data evaluation and todays exam - 35 minutes  Epifanio Lesches M.D on 02/22/2018 at 2:13 PM    Note: This dictation was prepared with Dragon dictation along with smaller phrase technology. Any transcriptional errors that result from this process are unintentional.

## 2018-03-04 ENCOUNTER — Encounter: Payer: Self-pay | Admitting: Emergency Medicine

## 2018-03-04 ENCOUNTER — Other Ambulatory Visit: Payer: Self-pay

## 2018-03-04 DIAGNOSIS — K2970 Gastritis, unspecified, without bleeding: Secondary | ICD-10-CM | POA: Diagnosis present

## 2018-03-04 DIAGNOSIS — F329 Major depressive disorder, single episode, unspecified: Secondary | ICD-10-CM | POA: Diagnosis present

## 2018-03-04 DIAGNOSIS — Z880 Allergy status to penicillin: Secondary | ICD-10-CM

## 2018-03-04 DIAGNOSIS — E871 Hypo-osmolality and hyponatremia: Secondary | ICD-10-CM | POA: Diagnosis present

## 2018-03-04 DIAGNOSIS — E878 Other disorders of electrolyte and fluid balance, not elsewhere classified: Secondary | ICD-10-CM | POA: Diagnosis present

## 2018-03-04 DIAGNOSIS — K219 Gastro-esophageal reflux disease without esophagitis: Secondary | ICD-10-CM | POA: Diagnosis present

## 2018-03-04 DIAGNOSIS — K259 Gastric ulcer, unspecified as acute or chronic, without hemorrhage or perforation: Secondary | ICD-10-CM | POA: Diagnosis present

## 2018-03-04 DIAGNOSIS — K5909 Other constipation: Principal | ICD-10-CM | POA: Diagnosis present

## 2018-03-04 DIAGNOSIS — M1990 Unspecified osteoarthritis, unspecified site: Secondary | ICD-10-CM | POA: Diagnosis present

## 2018-03-04 DIAGNOSIS — Z90710 Acquired absence of both cervix and uterus: Secondary | ICD-10-CM

## 2018-03-04 DIAGNOSIS — N179 Acute kidney failure, unspecified: Secondary | ICD-10-CM | POA: Diagnosis present

## 2018-03-04 DIAGNOSIS — I714 Abdominal aortic aneurysm, without rupture: Secondary | ICD-10-CM | POA: Diagnosis present

## 2018-03-04 DIAGNOSIS — Z9049 Acquired absence of other specified parts of digestive tract: Secondary | ICD-10-CM

## 2018-03-04 DIAGNOSIS — Z79899 Other long term (current) drug therapy: Secondary | ICD-10-CM

## 2018-03-04 DIAGNOSIS — I129 Hypertensive chronic kidney disease with stage 1 through stage 4 chronic kidney disease, or unspecified chronic kidney disease: Secondary | ICD-10-CM | POA: Diagnosis present

## 2018-03-04 DIAGNOSIS — K862 Cyst of pancreas: Secondary | ICD-10-CM | POA: Diagnosis present

## 2018-03-04 DIAGNOSIS — E785 Hyperlipidemia, unspecified: Secondary | ICD-10-CM | POA: Diagnosis present

## 2018-03-04 DIAGNOSIS — Z66 Do not resuscitate: Secondary | ICD-10-CM | POA: Diagnosis present

## 2018-03-04 DIAGNOSIS — Z7982 Long term (current) use of aspirin: Secondary | ICD-10-CM

## 2018-03-04 DIAGNOSIS — N184 Chronic kidney disease, stage 4 (severe): Secondary | ICD-10-CM | POA: Diagnosis present

## 2018-03-04 DIAGNOSIS — K3184 Gastroparesis: Secondary | ICD-10-CM | POA: Diagnosis present

## 2018-03-04 DIAGNOSIS — G629 Polyneuropathy, unspecified: Secondary | ICD-10-CM | POA: Diagnosis present

## 2018-03-04 DIAGNOSIS — M109 Gout, unspecified: Secondary | ICD-10-CM | POA: Diagnosis present

## 2018-03-04 DIAGNOSIS — E860 Dehydration: Secondary | ICD-10-CM | POA: Diagnosis present

## 2018-03-04 DIAGNOSIS — G2581 Restless legs syndrome: Secondary | ICD-10-CM | POA: Diagnosis present

## 2018-03-04 DIAGNOSIS — Z85828 Personal history of other malignant neoplasm of skin: Secondary | ICD-10-CM

## 2018-03-04 DIAGNOSIS — H9190 Unspecified hearing loss, unspecified ear: Secondary | ICD-10-CM | POA: Diagnosis present

## 2018-03-04 DIAGNOSIS — R112 Nausea with vomiting, unspecified: Secondary | ICD-10-CM | POA: Diagnosis not present

## 2018-03-04 LAB — COMPREHENSIVE METABOLIC PANEL
ALT: 24 U/L (ref 0–44)
AST: 35 U/L (ref 15–41)
Albumin: 4.5 g/dL (ref 3.5–5.0)
Alkaline Phosphatase: 93 U/L (ref 38–126)
Anion gap: 12 (ref 5–15)
BILIRUBIN TOTAL: 0.9 mg/dL (ref 0.3–1.2)
BUN: 30 mg/dL — AB (ref 8–23)
CHLORIDE: 90 mmol/L — AB (ref 98–111)
CO2: 22 mmol/L (ref 22–32)
CREATININE: 1.76 mg/dL — AB (ref 0.44–1.00)
Calcium: 9.4 mg/dL (ref 8.9–10.3)
GFR calc non Af Amer: 25 mL/min — ABNORMAL LOW (ref >60)
GFR, EST AFRICAN AMERICAN: 29 mL/min — AB (ref >60)
Glucose, Bld: 129 mg/dL — ABNORMAL HIGH (ref 70–99)
POTASSIUM: 3.9 mmol/L (ref 3.5–5.1)
Sodium: 124 mmol/L — ABNORMAL LOW (ref 135–145)
Total Protein: 7.6 g/dL (ref 6.5–8.1)

## 2018-03-04 LAB — CBC
HCT: 34.9 % — ABNORMAL LOW (ref 36.0–46.0)
HEMOGLOBIN: 12.3 g/dL (ref 12.0–15.0)
MCH: 31.3 pg (ref 26.0–34.0)
MCHC: 35.2 g/dL (ref 30.0–36.0)
MCV: 88.8 fL (ref 80.0–100.0)
NRBC: 0 % (ref 0.0–0.2)
Platelets: 232 10*3/uL (ref 150–400)
RBC: 3.93 MIL/uL (ref 3.87–5.11)
RDW: 12.3 % (ref 11.5–15.5)
WBC: 9.1 10*3/uL (ref 4.0–10.5)

## 2018-03-04 LAB — LIPASE, BLOOD: LIPASE: 62 U/L — AB (ref 11–51)

## 2018-03-04 MED ORDER — ONDANSETRON HCL 4 MG/2ML IJ SOLN
INTRAMUSCULAR | Status: AC
  Administered 2018-03-04: 4 mg via INTRAVENOUS
  Filled 2018-03-04: qty 2

## 2018-03-04 MED ORDER — ONDANSETRON HCL 4 MG/2ML IJ SOLN
4.0000 mg | Freq: Once | INTRAMUSCULAR | Status: AC | PRN
  Administered 2018-03-04: 4 mg via INTRAVENOUS
  Filled 2018-03-04: qty 2

## 2018-03-04 NOTE — ED Triage Notes (Signed)
Pt presents to ER from home with emesis, reports for the past 2 days has been vomiting and today has increased. Pt reports was discharged from hospital about a week ago due to UTI

## 2018-03-05 ENCOUNTER — Inpatient Hospital Stay
Admission: EM | Admit: 2018-03-05 | Discharge: 2018-03-08 | DRG: 392 | Disposition: A | Discharge status: Home Health | Payer: Medicare Other | Attending: Family Medicine | Admitting: Family Medicine

## 2018-03-05 ENCOUNTER — Encounter: Payer: Self-pay | Admitting: Internal Medicine

## 2018-03-05 ENCOUNTER — Emergency Department: Payer: Medicare Other

## 2018-03-05 DIAGNOSIS — N184 Chronic kidney disease, stage 4 (severe): Secondary | ICD-10-CM | POA: Diagnosis present

## 2018-03-05 DIAGNOSIS — K2970 Gastritis, unspecified, without bleeding: Secondary | ICD-10-CM | POA: Diagnosis present

## 2018-03-05 DIAGNOSIS — H9190 Unspecified hearing loss, unspecified ear: Secondary | ICD-10-CM | POA: Diagnosis present

## 2018-03-05 DIAGNOSIS — Z85828 Personal history of other malignant neoplasm of skin: Secondary | ICD-10-CM | POA: Diagnosis not present

## 2018-03-05 DIAGNOSIS — K3184 Gastroparesis: Secondary | ICD-10-CM | POA: Diagnosis present

## 2018-03-05 DIAGNOSIS — K259 Gastric ulcer, unspecified as acute or chronic, without hemorrhage or perforation: Secondary | ICD-10-CM | POA: Diagnosis present

## 2018-03-05 DIAGNOSIS — N179 Acute kidney failure, unspecified: Secondary | ICD-10-CM

## 2018-03-05 DIAGNOSIS — K219 Gastro-esophageal reflux disease without esophagitis: Secondary | ICD-10-CM | POA: Diagnosis present

## 2018-03-05 DIAGNOSIS — K862 Cyst of pancreas: Secondary | ICD-10-CM

## 2018-03-05 DIAGNOSIS — Z9049 Acquired absence of other specified parts of digestive tract: Secondary | ICD-10-CM | POA: Diagnosis not present

## 2018-03-05 DIAGNOSIS — E860 Dehydration: Secondary | ICD-10-CM | POA: Diagnosis present

## 2018-03-05 DIAGNOSIS — M1990 Unspecified osteoarthritis, unspecified site: Secondary | ICD-10-CM | POA: Diagnosis present

## 2018-03-05 DIAGNOSIS — I714 Abdominal aortic aneurysm, without rupture: Secondary | ICD-10-CM | POA: Diagnosis present

## 2018-03-05 DIAGNOSIS — E871 Hypo-osmolality and hyponatremia: Secondary | ICD-10-CM | POA: Diagnosis present

## 2018-03-05 DIAGNOSIS — K5909 Other constipation: Secondary | ICD-10-CM | POA: Diagnosis present

## 2018-03-05 DIAGNOSIS — R112 Nausea with vomiting, unspecified: Secondary | ICD-10-CM | POA: Diagnosis present

## 2018-03-05 DIAGNOSIS — G629 Polyneuropathy, unspecified: Secondary | ICD-10-CM | POA: Diagnosis present

## 2018-03-05 DIAGNOSIS — Z66 Do not resuscitate: Secondary | ICD-10-CM | POA: Diagnosis present

## 2018-03-05 DIAGNOSIS — G2581 Restless legs syndrome: Secondary | ICD-10-CM | POA: Diagnosis present

## 2018-03-05 DIAGNOSIS — R110 Nausea: Secondary | ICD-10-CM | POA: Diagnosis not present

## 2018-03-05 DIAGNOSIS — M109 Gout, unspecified: Secondary | ICD-10-CM | POA: Diagnosis present

## 2018-03-05 DIAGNOSIS — I129 Hypertensive chronic kidney disease with stage 1 through stage 4 chronic kidney disease, or unspecified chronic kidney disease: Secondary | ICD-10-CM | POA: Diagnosis present

## 2018-03-05 DIAGNOSIS — K3189 Other diseases of stomach and duodenum: Secondary | ICD-10-CM | POA: Diagnosis not present

## 2018-03-05 DIAGNOSIS — Z90710 Acquired absence of both cervix and uterus: Secondary | ICD-10-CM | POA: Diagnosis not present

## 2018-03-05 DIAGNOSIS — E878 Other disorders of electrolyte and fluid balance, not elsewhere classified: Secondary | ICD-10-CM | POA: Diagnosis present

## 2018-03-05 DIAGNOSIS — F329 Major depressive disorder, single episode, unspecified: Secondary | ICD-10-CM | POA: Diagnosis present

## 2018-03-05 DIAGNOSIS — E785 Hyperlipidemia, unspecified: Secondary | ICD-10-CM | POA: Diagnosis present

## 2018-03-05 LAB — BASIC METABOLIC PANEL
Anion gap: 9 (ref 5–15)
BUN: 28 mg/dL — AB (ref 8–23)
CALCIUM: 8.8 mg/dL — AB (ref 8.9–10.3)
CO2: 23 mmol/L (ref 22–32)
CREATININE: 1.69 mg/dL — AB (ref 0.44–1.00)
Chloride: 96 mmol/L — ABNORMAL LOW (ref 98–111)
GFR calc Af Amer: 30 mL/min — ABNORMAL LOW (ref >60)
GFR, EST NON AFRICAN AMERICAN: 26 mL/min — AB (ref >60)
GLUCOSE: 103 mg/dL — AB (ref 70–99)
POTASSIUM: 4 mmol/L (ref 3.5–5.1)
Sodium: 128 mmol/L — ABNORMAL LOW (ref 135–145)

## 2018-03-05 LAB — URINALYSIS, COMPLETE (UACMP) WITH MICROSCOPIC
Bacteria, UA: NONE SEEN
Bilirubin Urine: NEGATIVE
Glucose, UA: NEGATIVE mg/dL
Hgb urine dipstick: NEGATIVE
Ketones, ur: NEGATIVE mg/dL
Leukocytes, UA: NEGATIVE
NITRITE: NEGATIVE
Protein, ur: NEGATIVE mg/dL
SPECIFIC GRAVITY, URINE: 1.005 (ref 1.005–1.030)
pH: 7 (ref 5.0–8.0)

## 2018-03-05 LAB — LACTIC ACID, PLASMA
LACTIC ACID, VENOUS: 1.3 mmol/L (ref 0.5–1.9)
LACTIC ACID, VENOUS: 1 mmol/L (ref 0.5–1.9)

## 2018-03-05 LAB — MRSA PCR SCREENING: MRSA BY PCR: NEGATIVE

## 2018-03-05 MED ORDER — SODIUM CHLORIDE 0.9 % IV SOLN
INTRAVENOUS | Status: DC
  Administered 2018-03-05 (×2): via INTRAVENOUS

## 2018-03-05 MED ORDER — SODIUM CHLORIDE 1 G PO TABS
2.0000 g | ORAL_TABLET | Freq: Three times a day (TID) | ORAL | Status: AC
  Administered 2018-03-05 – 2018-03-06 (×4): 2 g via ORAL
  Filled 2018-03-05 (×6): qty 2

## 2018-03-05 MED ORDER — CITALOPRAM HYDROBROMIDE 20 MG PO TABS
20.0000 mg | ORAL_TABLET | Freq: Every day | ORAL | Status: DC
  Administered 2018-03-05 – 2018-03-06 (×2): 20 mg via ORAL
  Filled 2018-03-05 (×2): qty 1

## 2018-03-05 MED ORDER — HYDROMORPHONE HCL 1 MG/ML IJ SOLN: 0.5000 mg | INTRAMUSCULAR | Status: DC | PRN

## 2018-03-05 MED ORDER — LOSARTAN POTASSIUM 50 MG PO TABS
100.0000 mg | ORAL_TABLET | Freq: Every day | ORAL | Status: DC
  Administered 2018-03-05 – 2018-03-08 (×4): 100 mg via ORAL
  Filled 2018-03-05 (×4): qty 2

## 2018-03-05 MED ORDER — SIMVASTATIN 20 MG PO TABS
20.0000 mg | ORAL_TABLET | Freq: Every day | ORAL | Status: DC
  Administered 2018-03-06: 20 mg via ORAL
  Filled 2018-03-05 (×2): qty 1

## 2018-03-05 MED ORDER — ONDANSETRON HCL 4 MG PO TABS: 4.0000 mg | ORAL_TABLET | Freq: Four times a day (QID) | ORAL | Status: DC | PRN

## 2018-03-05 MED ORDER — SODIUM CHLORIDE 0.9 % IV BOLUS
1000.0000 mL | Freq: Once | INTRAVENOUS | Status: AC
  Administered 2018-03-05: 1000 mL via INTRAVENOUS

## 2018-03-05 MED ORDER — SENNOSIDES-DOCUSATE SODIUM 8.6-50 MG PO TABS: 1.0000 | ORAL_TABLET | Freq: Every evening | ORAL | Status: DC | PRN

## 2018-03-05 MED ORDER — ACETAMINOPHEN 325 MG PO TABS
650.0000 mg | ORAL_TABLET | Freq: Four times a day (QID) | ORAL | Status: DC | PRN
  Administered 2018-03-05 – 2018-03-06 (×2): 650 mg via ORAL
  Filled 2018-03-05 (×2): qty 2

## 2018-03-05 MED ORDER — BUPROPION HCL ER (SR) 150 MG PO TB12
150.0000 mg | ORAL_TABLET | Freq: Every day | ORAL | Status: DC
  Administered 2018-03-05: 150 mg via ORAL
  Filled 2018-03-05 (×3): qty 1

## 2018-03-05 MED ORDER — PROCHLORPERAZINE EDISYLATE 10 MG/2ML IJ SOLN
5.0000 mg | Freq: Once | INTRAMUSCULAR | Status: AC
  Administered 2018-03-05: 5 mg via INTRAVENOUS
  Filled 2018-03-05: qty 2

## 2018-03-05 MED ORDER — METOPROLOL SUCCINATE ER 25 MG PO TB24
37.5000 mg | ORAL_TABLET | Freq: Every day | ORAL | Status: DC
  Administered 2018-03-05 – 2018-03-07 (×3): 37.5 mg via ORAL
  Filled 2018-03-05 (×3): qty 2

## 2018-03-05 MED ORDER — GABAPENTIN 100 MG PO CAPS
100.0000 mg | ORAL_CAPSULE | Freq: Two times a day (BID) | ORAL | Status: DC
  Administered 2018-03-05 – 2018-03-08 (×6): 100 mg via ORAL
  Filled 2018-03-05 (×7): qty 1

## 2018-03-05 MED ORDER — TRAZODONE HCL 50 MG PO TABS
25.0000 mg | ORAL_TABLET | Freq: Every day | ORAL | Status: DC
  Administered 2018-03-05 – 2018-03-07 (×3): 25 mg via ORAL
  Filled 2018-03-05 (×3): qty 1

## 2018-03-05 MED ORDER — LOSARTAN POTASSIUM-HCTZ 100-25 MG PO TABS: 1.0000 | ORAL_TABLET | Freq: Every day | ORAL | Status: DC

## 2018-03-05 MED ORDER — DIPHENHYDRAMINE HCL 50 MG/ML IJ SOLN
12.5000 mg | Freq: Once | INTRAMUSCULAR | Status: AC
  Administered 2018-03-05: 12.5 mg via INTRAVENOUS
  Filled 2018-03-05: qty 1

## 2018-03-05 MED ORDER — DOCUSATE SODIUM 100 MG PO CAPS: 100.0000 mg | ORAL_CAPSULE | Freq: Two times a day (BID) | ORAL | 2 refills | Status: DC

## 2018-03-05 MED ORDER — ASPIRIN EC 81 MG PO TBEC
81.0000 mg | DELAYED_RELEASE_TABLET | Freq: Every day | ORAL | Status: DC
  Administered 2018-03-05 – 2018-03-08 (×3): 81 mg via ORAL
  Filled 2018-03-05 (×3): qty 1

## 2018-03-05 MED ORDER — BISACODYL 5 MG PO TBEC
5.0000 mg | DELAYED_RELEASE_TABLET | Freq: Every day | ORAL | Status: DC | PRN
  Administered 2018-03-06: 5 mg via ORAL
  Filled 2018-03-05: qty 1

## 2018-03-05 MED ORDER — PROCHLORPERAZINE EDISYLATE 10 MG/2ML IJ SOLN
10.0000 mg | Freq: Four times a day (QID) | INTRAMUSCULAR | Status: DC | PRN
  Administered 2018-03-07: 10 mg via INTRAVENOUS
  Filled 2018-03-05 (×2): qty 2

## 2018-03-05 MED ORDER — GLUCERNA SHAKE PO LIQD
237.0000 mL | Freq: Three times a day (TID) | ORAL | Status: DC
  Administered 2018-03-05 – 2018-03-06 (×4): 237 mL via ORAL

## 2018-03-05 MED ORDER — ACETAMINOPHEN 10 MG/ML IV SOLN
1000.0000 mg | Freq: Once | INTRAVENOUS | Status: AC
  Administered 2018-03-05: 1000 mg via INTRAVENOUS
  Filled 2018-03-05: qty 100

## 2018-03-05 MED ORDER — GABAPENTIN 600 MG PO TABS
300.0000 mg | ORAL_TABLET | Freq: Two times a day (BID) | ORAL | Status: DC
  Administered 2018-03-05: 300 mg via ORAL
  Filled 2018-03-05: qty 1

## 2018-03-05 MED ORDER — ACETAMINOPHEN 650 MG RE SUPP: 650.0000 mg | Freq: Four times a day (QID) | RECTAL | Status: DC | PRN

## 2018-03-05 MED ORDER — HYDRALAZINE HCL 50 MG PO TABS
50.0000 mg | ORAL_TABLET | Freq: Two times a day (BID) | ORAL | Status: DC
  Administered 2018-03-05 – 2018-03-08 (×7): 50 mg via ORAL
  Filled 2018-03-05 (×7): qty 1

## 2018-03-05 MED ORDER — HEPARIN SODIUM (PORCINE) 5000 UNIT/ML IJ SOLN
5000.0000 [IU] | Freq: Three times a day (TID) | INTRAMUSCULAR | Status: DC
  Administered 2018-03-05 – 2018-03-07 (×5): 5000 [IU] via SUBCUTANEOUS
  Filled 2018-03-05 (×4): qty 1

## 2018-03-05 MED ORDER — ONDANSETRON HCL 4 MG/2ML IJ SOLN: 4.0000 mg | Freq: Four times a day (QID) | INTRAMUSCULAR | Status: DC | PRN

## 2018-03-05 MED ORDER — COLCHICINE 0.6 MG PO TABS: 0.6000 mg | ORAL_TABLET | Freq: Every day | ORAL | Status: DC | PRN

## 2018-03-05 MED ORDER — DEXTROSE-NACL 5-0.9 % IV SOLN
INTRAVENOUS | Status: DC
  Administered 2018-03-05 – 2018-03-07 (×3): via INTRAVENOUS

## 2018-03-05 MED ORDER — ALLOPURINOL 100 MG PO TABS
50.0000 mg | ORAL_TABLET | Freq: Every day | ORAL | Status: DC
  Administered 2018-03-05 – 2018-03-08 (×2): 50 mg via ORAL
  Filled 2018-03-05 (×4): qty 0.5

## 2018-03-05 MED ORDER — HYDROCHLOROTHIAZIDE 25 MG PO TABS
25.0000 mg | ORAL_TABLET | Freq: Every day | ORAL | Status: DC
  Administered 2018-03-05 – 2018-03-06 (×2): 25 mg via ORAL
  Filled 2018-03-05 (×2): qty 1

## 2018-03-05 NOTE — ED Provider Notes (Signed)
[facility] Emergency Department Provider Note  ____________________________________________   First MD Initiated Contact with Patient 03/05/18 [phone]     (approximate)  I have reviewed the triage vital signs and the nursing notes.   HISTORY  Chief Complaint Emesis   HPI Tina Curtis is a 84 y.o. female who self presents to the emergency department with nausea, vomiting, epigastric discomfort, and fatigue for the past several days.  She has a complex past medical history including hypertension, dyslipidemia, arthritis, and about 10 days ago was discharged from our hospital after a stay for urinary tract infection.  She says that for the past 6 months or so she has had intermittent episodes of mid abdominal discomfort nausea vomiting and difficulty keeping down food.  She has not been hospitalized for any of these episodes.  Her symptoms have become acutely worse over the past 48 hours.  They were gradual onset are now moderate to severe.  She has been taking a large amount of Zofran at home and has begun to suffer from headaches and constipation.  [name] that she is dehydrated she does not seem to be able to keep any food down.  She does have one previous episode of pancreatitis in the past.  She has a remote surgical history of appendectomy and hysterectomy.  She does not drink alcohol.    Past Medical History:  Diagnosis Date  . Arthritis    Gout  . Hypercholesteremia   . Hypertension   . Neuropathy    feet and lower legs  . Seasonal allergies   . Skin cancer    face    Patient Active Problem List   Diagnosis Date Noted  . Nausea & vomiting 03/05/2018  . AKI (acute kidney injury) (Interior) 02/17/2018    Past Surgical History:  Procedure Laterality Date  . ABDOMINAL HYSTERECTOMY  2000  . APPENDECTOMY    . CATARACT EXTRACTION Left   . CATARACT EXTRACTION W/PHACO Right 10/24/2014   Procedure: CATARACT EXTRACTION PHACO AND INTRAOCULAR LENS  PLACEMENT (IOC);  Surgeon: Leandrew Koyanagi, MD;  Location: [place];  Service: Ophthalmology;  Laterality: Right;    Prior to Admission medications   Medication Sig Start Date End Date Taking? Authorizing Provider  allopurinol (ZYLOPRIM) 300 MG tablet Take 300 mg by mouth daily. AM   Yes [provider]  aspirin EC 81 MG tablet Take 81 mg by mouth daily.   Yes [provider]  buPROPion (WELLBUTRIN SR) 150 MG 12 hr tablet Take 1 tablet (150 mg total) by mouth daily. 02/22/18  Yes Epifanio Lesches, MD  citalopram (CELEXA) 20 MG tablet Take 20 mg by mouth daily. 02/14/18  Yes [provider]  colchicine 0.6 MG tablet Take 0.6 mg by mouth daily as needed.   Yes [provider]  feeding supplement, GLUCERNA SHAKE, (GLUCERNA SHAKE) LIQD Take 237 mLs by mouth 3 (three) times daily between meals. 02/22/18  Yes Epifanio Lesches, MD  gabapentin (NEURONTIN) 300 MG capsule Take 300 mg by mouth at bedtime.    Yes [provider]  hydrALAZINE (APRESOLINE) 50 MG tablet Take 1 tablet (50 mg total) by mouth 2 (two) times daily. 02/22/18  Yes Epifanio Lesches, MD  losartan-hydrochlorothiazide (HYZAAR) 100-25 MG tablet Take 1 tablet by mouth daily.   Yes [provider]  metoprolol succinate (TOPROL-XL) 25 MG 24 hr tablet Take 37.5 mg by mouth at bedtime.    Yes [provider]  mupirocin ointment (BACTROBAN) 2 %  Place 1 application into the nose 2 (two) times daily. Pt needs for one more day. 02/22/18  Yes Epifanio Lesches, MD  ondansetron (ZOFRAN-ODT) 4 MG disintegrating tablet Take 4 mg by mouth every 8 (eight) hours as needed for nausea or vomiting.   Yes [provider]  simvastatin (ZOCOR) 20 MG tablet Take 20 mg by mouth daily. PM   Yes [provider]  acetaminophen (TYLENOL) 500 MG tablet Take 500 mg by mouth every 6 (six) hours as needed.    [provider]  Cholecalciferol (VITAMIN D  PO) Take by mouth.    [provider]  ciprofloxacin (CIPRO) 500 MG tablet Take 1 tablet (500 mg total) by mouth daily. Patient not taking: Reported on 03/05/2018 02/22/18   Epifanio Lesches, MD  loratadine (CLARITIN) 10 MG tablet Take 10 mg by mouth daily as needed for allergies. AM    [provider]  Multiple Vitamins-Minerals (ICAPS PO) Take by mouth daily.    [provider]  Probiotic Product (ALIGN PO) Take by mouth. AM    [provider]  risperiDONE (RISPERDAL M-TABS) 0.5 MG disintegrating tablet Take 1 tablet (0.5 mg total) by mouth 2 (two) times daily as needed (psychosis). Patient not taking: Reported on 03/05/2018 02/22/18   Epifanio Lesches, MD  traZODone (DESYREL) 50 MG tablet Take 0.5 tablets (25 mg total) by mouth at bedtime. 02/22/18   Epifanio Lesches, MD    Allergies Penicillins  History reviewed. No pertinent family history.  Social History Social History   Tobacco Use  . Smoking status: Never Smoker  . Smokeless tobacco: Never Used  Substance Use Topics  . Alcohol use: No  . Drug use: Not on file    Review of Systems Constitutional: No fever/chills Eyes: No visual changes. ENT: No sore throat. Cardiovascular: Denies chest pain. Respiratory: Denies shortness of breath. Gastrointestinal: Positive for abdominal pain.  Positive for nausea, positive for vomiting.  No diarrhea.  Positive for constipation. Genitourinary: Negative for dysuria. Musculoskeletal: Negative for back pain. Skin: Negative for rash. Neurological: Positive for headache   ____________________________________________   PHYSICAL EXAM:  VITAL SIGNS: ED Triage Vitals  Enc Vitals Group     BP 03/04/18 2210 (!) 205/83     Pulse Rate 03/04/18 2210 70     Resp 03/04/18 2210 (!) 21     Temp 03/04/18 2210 98.1 F (36.7 C)     Temp Source 03/04/18 2210 Oral     SpO2 03/04/18 2210 98 %     Weight 03/04/18 2212 143 lb (64.9 kg)     Height  03/04/18 2212 5\' 4"  (1.626 m)     Head Circumference --      Peak Flow --      Pain Score 03/04/18 2211 0     Pain Loc --      Pain Edu? --      Excl. in [place]? --     Constitutional: Alert and oriented x4 obviously uncomfortable appearing curled somewhat on her side holding her mid abdomen and wincing Eyes: PERRL EOMI. Head: Atraumatic. Nose: No congestion/rhinnorhea. Mouth/Throat: No trismus Neck: No stridor.   Cardiovascular: Normal rate, regular rhythm. Grossly normal heart sounds.  Good peripheral circulation. Respiratory: Normal respiratory effort.  No retractions. Lungs CTAB and moving good air Gastrointestinal: Soft mild umbilical tenderness with no rebound or guarding no peritonitis no costovertebral tenderness Musculoskeletal: No lower extremity edema   Neurologic:  Normal speech and language. No gross focal neurologic deficits are appreciated.  Skin:  Skin is warm, dry and intact. No rash noted. Psychiatric: Mood and affect are normal. Speech and behavior are normal.    ____________________________________________   DIFFERENTIAL includes but not limited to  Pancreatitis, dehydration, metabolic derangement, biliary disease, gastric disease ____________________________________________   LABS (all labs ordered are listed, but only abnormal results are displayed)  Labs Reviewed  LIPASE, BLOOD - Abnormal; Notable for the following components:      Result Value   Lipase 62 (*)    All other components within normal limits  COMPREHENSIVE METABOLIC PANEL - Abnormal; Notable for the following components:   Sodium 124 (*)    Chloride 90 (*)    Glucose, Bld 129 (*)    BUN 30 (*)    Creatinine, Ser 1.76 (*)    GFR calc non Af Amer 25 (*)    GFR calc Af Amer 29 (*)    All other components within normal limits  CBC - Abnormal; Notable for the following components:   HCT 34.9 (*)    All other components within normal limits  URINALYSIS, COMPLETE (UACMP) WITH MICROSCOPIC -  Abnormal; Notable for the following components:   Color, Urine STRAW (*)    APPearance CLEAR (*)    All other components within normal limits  MRSA PCR SCREENING  LACTIC ACID, PLASMA  LACTIC ACID, PLASMA    Lab work reviewed by me with hypochloremic hyponatremia.  CKD is actually improved from baseline.  Slightly elevated lipase does not meet criteria for pancreatitis __________________________________________  EKG   ____________________________________________  RADIOLOGY  CT abdomen pelvis without contrast reviewed by me shows pancreatic cysts along with significant stool burden ____________________________________________   PROCEDURES  Procedure(s) performed: no  Procedures  Critical Care performed: no  ____________________________________________   INITIAL IMPRESSION / ASSESSMENT AND PLAN / ED COURSE  Pertinent labs & imaging results that were available during my care of the patient were reviewed by me and considered in my medical decision making (see chart for details).   As part of my medical decision making, I reviewed the following data within the [facility] History obtained from family if available, nursing notes, old chart and ekg, as well as notes from prior ED visits.  If the patient comes to the emergency department with nausea vomiting umbilical pain constipation and headache.  She is clinically dehydrated and is having difficulty keeping even water down.  Given her advanced age I did obtain a CT scan abdomen pelvis which fortunately is negative for acute surgical or bacterial pathology.  I am concerned about her hypochloremic hyponatremia.  Given 1 L of IV fluids along with 5 mg of Compazine, 12.5 mg of Benadryl, and 1 g of IV Tylenol with significant improvement in her symptoms.  At this point I do believe she requires inpatient admission given her dehydration and multiple metabolic abnormalities.  She may require an MRI to further characterize  her pancreatic cysts.  It is possible that her episodes of pain and nausea over the past 6 months have been stuttering pancreatitis.  We discussed with the hospitalist who has graciously agreed to admit the patient to his service.      ____________________________________________   FINAL CLINICAL IMPRESSION(S) / ED DIAGNOSES  Final diagnoses:  Nausea and vomiting, intractability of vomiting not specified, unspecified vomiting type  Hyponatremia  Pancreatic cyst  Dehydration      NEW MEDICATIONS STARTED DURING THIS VISIT:  Current Discharge Medication List       Note:  This document was prepared using Dragon voice recognition software and may include unintentional dictation errors.     Darel Hong, MD 03/05/18 [phone]

## 2018-03-05 NOTE — H&P (Signed)
[place] [facility] [place] NAME: Tina Curtis    MR#:  [id number]  DATE OF BIRTH:  May 02, 1927  DATE OF ADMISSION:  03/05/2018  PRIMARY CARE PHYSICIAN: Derinda Late, MD   REQUESTING/REFERRING PHYSICIAN: Darel Hong, MD  CHIEF COMPLAINT:   Chief Complaint  Patient presents with  . Emesis    HISTORY OF PRESENT ILLNESS:  Tina Curtis  is a 83 y.o. female with a known history of HTN, HLD, gout, RLS p/w N/V/AP. AAOx3, good historian. She is hard of hearing, but she can hear satisfactorily if you stand close to her. She endorses chronic intermittent lightheadedness and nausea since July. She tells me she was recently hospitalized from 10/10 to 10/15 for a urinary tract infection and renal failure. She states that when she was discharged on 10/15, she still had some mild nausea, but felt as though she was generally improving. She states that for the past 3d, however, she has been having worse nausea than usual. She endorses PO intolerance and N/V w/ attempted PO intake. She endorses poor intake and dehydration. She endorses diffuse abdominal pain/discomfort and bloating/gaseous distension. She states her symptoms have been especially severe x1d, prompting hospitalization. She endorses a Hx of acute pancreatitis in 1962, but denies having had pancreatitis since then. Lipase 62. CT A/P report reads, "There are multiple low attenuating/cystic lesions involving the pancreas which are not characterized on this noncontrast CT but may represent side branch IPMNs." There is no mention of pancreatic edema, peripancreatic fluid collection, necrosis or abscess. These findings are likely chronic, and I am not entirely sure her present symptomatology is related to this imaging finding.  PAST MEDICAL HISTORY:   Past Medical History:  Diagnosis Date  . Arthritis    Gout  . Hypercholesteremia   . Hypertension   . Neuropathy    feet and lower legs  . Seasonal  allergies   . Skin cancer    face    PAST SURGICAL HISTORY:   Past Surgical History:  Procedure Laterality Date  . ABDOMINAL HYSTERECTOMY  2000  . APPENDECTOMY    . CATARACT EXTRACTION Left   . CATARACT EXTRACTION W/PHACO Right 10/24/2014   Procedure: CATARACT EXTRACTION PHACO AND INTRAOCULAR LENS PLACEMENT (IOC);  Surgeon: Leandrew Koyanagi, MD;  Location: [place];  Service: Ophthalmology;  Laterality: Right;    SOCIAL HISTORY:   Social History   Tobacco Use  . Smoking status: Never Smoker  . Smokeless tobacco: Never Used  Substance Use Topics  . Alcohol use: No    FAMILY HISTORY:  History reviewed. No pertinent family history.  DRUG ALLERGIES:   Allergies  Allergen Reactions  . Penicillins Anaphylaxis    REVIEW OF SYSTEMS:   Review of Systems  Constitutional: Positive for malaise/fatigue. Negative for chills, diaphoresis, fever and weight loss.  HENT: Negative for congestion, ear pain, hearing loss, nosebleeds, sinus pain, sore throat and tinnitus.   Eyes: Negative for blurred vision, double vision and photophobia.  Respiratory: Negative for cough, hemoptysis, sputum production, shortness of breath and wheezing.   Cardiovascular: Negative for chest pain, palpitations, orthopnea, claudication, leg swelling and PND.  Gastrointestinal: Positive for abdominal pain, nausea and vomiting. Negative for blood in stool, constipation, diarrhea, heartburn and melena.  Genitourinary: Negative for dysuria, frequency, hematuria and urgency.  Musculoskeletal: Negative for back pain, joint pain, myalgias and neck pain.  Skin: Negative for itching and rash.  Neurological: Positive for weakness. Negative for dizziness, tingling, tremors, sensory change,  speech change, focal weakness, seizures, loss of consciousness and headaches.  Psychiatric/Behavioral: Negative for memory loss. The patient does not have insomnia.    MEDICATIONS AT HOME:   Prior to Admission  medications   Medication Sig Start Date End Date Taking? Authorizing Provider  allopurinol (ZYLOPRIM) 300 MG tablet Take 300 mg by mouth daily. AM   Yes [provider]  aspirin EC 81 MG tablet Take 81 mg by mouth daily.   Yes [provider]  buPROPion (WELLBUTRIN SR) 150 MG 12 hr tablet Take 1 tablet (150 mg total) by mouth daily. 02/22/18  Yes Epifanio Lesches, MD  citalopram (CELEXA) 20 MG tablet Take 20 mg by mouth daily. 02/14/18  Yes [provider]  colchicine 0.6 MG tablet Take 0.6 mg by mouth daily as needed.   Yes [provider]  feeding supplement, GLUCERNA SHAKE, (GLUCERNA SHAKE) LIQD Take 237 mLs by mouth 3 (three) times daily between meals. 02/22/18  Yes Epifanio Lesches, MD  gabapentin (NEURONTIN) 300 MG capsule Take 300 mg by mouth at bedtime.    Yes [provider]  hydrALAZINE (APRESOLINE) 50 MG tablet Take 1 tablet (50 mg total) by mouth 2 (two) times daily. 02/22/18  Yes Epifanio Lesches, MD  losartan-hydrochlorothiazide (HYZAAR) 100-25 MG tablet Take 1 tablet by mouth daily.   Yes [provider]  metoprolol succinate (TOPROL-XL) 25 MG 24 hr tablet Take 37.5 mg by mouth at bedtime.    Yes [provider]  mupirocin ointment (BACTROBAN) 2 % Place 1 application into the nose 2 (two) times daily. Pt needs for one more day. 02/22/18  Yes Epifanio Lesches, MD  ondansetron (ZOFRAN-ODT) 4 MG disintegrating tablet Take 4 mg by mouth every 8 (eight) hours as needed for nausea or vomiting.   Yes [provider]  simvastatin (ZOCOR) 20 MG tablet Take 20 mg by mouth daily. PM   Yes [provider]  acetaminophen (TYLENOL) 500 MG tablet Take 500 mg by mouth every 6 (six) hours as needed.    [provider]  Cholecalciferol (VITAMIN D PO) Take by mouth.    [provider]  ciprofloxacin (CIPRO) 500 MG tablet Take 1 tablet (500 mg total) by mouth daily. Patient not taking:  Reported on 03/05/2018 02/22/18   Epifanio Lesches, MD  loratadine (CLARITIN) 10 MG tablet Take 10 mg by mouth daily as needed for allergies. AM    [provider]  Multiple Vitamins-Minerals (ICAPS PO) Take by mouth daily.    [provider]  Probiotic Product (ALIGN PO) Take by mouth. AM    [provider]  risperiDONE (RISPERDAL M-TABS) 0.5 MG disintegrating tablet Take 1 tablet (0.5 mg total) by mouth 2 (two) times daily as needed (psychosis). Patient not taking: Reported on 03/05/2018 02/22/18   Epifanio Lesches, MD  traZODone (DESYREL) 50 MG tablet Take 0.5 tablets (25 mg total) by mouth at bedtime. 02/22/18   Epifanio Lesches, MD      VITAL SIGNS:  Blood pressure (!) 171/73, pulse 62, temperature (!) 97.5 F (36.4 C), temperature source Oral, resp. rate 17, height 5\' 6"  (1.676 m), weight 62.5 kg, SpO2 98 %.  PHYSICAL EXAMINATION:  Physical Exam  Constitutional: She is oriented to person, place, and time. She appears well-developed and well-nourished. She is active and cooperative.  Non-toxic appearance. She does not have a sickly appearance. She does not appear ill. No distress. She is not intubated.  HENT:  Head: Normocephalic and atraumatic.  Mouth/Throat: Oropharynx is clear  and moist. No oropharyngeal exudate.  Eyes: Conjunctivae, EOM and lids are normal. No scleral icterus.  Neck: Neck supple. No JVD present. No thyromegaly present.  Cardiovascular: Normal rate, regular rhythm, S1 normal, S2 normal and normal heart sounds.  No extrasystoles are present. Exam reveals no gallop, no S3, no S4, no distant heart sounds and no friction rub.  No murmur heard. Pulmonary/Chest: Effort normal and breath sounds normal. No accessory muscle usage or stridor. No apnea, no tachypnea and no bradypnea. She is not intubated. No respiratory distress. She has no decreased breath sounds. She has no wheezes. She has no rhonchi. She has no rales.  Abdominal: Soft.  She exhibits no distension. Bowel sounds are decreased. There is generalized tenderness. There is no rigidity, no rebound and no guarding.  Musculoskeletal: Normal range of motion. She exhibits no edema or tenderness.  Lymphadenopathy:    She has no cervical adenopathy.  Neurological: She is alert and oriented to person, place, and time. She is not disoriented.  Skin: Skin is warm, dry and intact. No rash noted. She is not diaphoretic. No erythema.  Psychiatric: She has a normal mood and affect. Her speech is normal and behavior is normal. Judgment and thought content normal. Cognition and memory are normal.   LABORATORY PANEL:   CBC Recent Labs  Lab 03/04/18 2228  WBC 9.1  HGB 12.3  HCT 34.9*  PLT 232   ------------------------------------------------------------------------------------------------------------------  Chemistries  Recent Labs  Lab 03/04/18 2228  NA 124*  K 3.9  CL 90*  CO2 22  GLUCOSE 129*  BUN 30*  CREATININE 1.76*  CALCIUM 9.4  AST 35  ALT 24  ALKPHOS 93  BILITOT 0.9   ------------------------------------------------------------------------------------------------------------------  Cardiac Enzymes No results for input(s): TROPONINI in the last 168 hours. ------------------------------------------------------------------------------------------------------------------  RADIOLOGY:  Ct Abdomen Pelvis Wo Contrast  Result Date: 03/05/2018 CLINICAL DATA:  84 year old female with abdominal pain. EXAM: CT ABDOMEN AND PELVIS WITHOUT CONTRAST TECHNIQUE: Multidetector CT imaging of the abdomen and pelvis was performed following the standard protocol without IV contrast. COMPARISON:  None. FINDINGS: Evaluation of this exam is limited in the absence of intravenous contrast. Lower chest: There are minimal bibasilar subpleural scarring. There is coronary vascular calcification. No intra-abdominal free air or free fluid. Hepatobiliary: The liver is unremarkable. No  calcified gallstone. Pancreas: There are multiple low attenuating/cystic lesions involving the pancreas which are not characterized on this noncontrast CT but may represent side branch IPMNs. Further evaluation with MRI is recommended. The largest measures approximately 16 mm in the proximal body of the pancreas. Spleen: Normal in size without focal abnormality. Adrenals/Urinary Tract: The adrenal glands are unremarkable. Moderate left renal atrophy. There is no hydronephrosis or nephrolithiasis on either side. The visualized ureters and urinary bladder appear unremarkable. Stomach/Bowel: There is extensive sigmoid and colonic diverticulosis without active inflammatory changes. There is moderate amount of stool throughout the colon. There is no bowel obstruction or active inflammation. Appendectomy. Vascular/Lymphatic: Advanced aortoiliac atherosclerotic disease. A 3.3 cm infrarenal abdominal aortic aneurysm. No portal venous gas. There is no adenopathy. Reproductive: Hysterectomy. No pelvic mass. Other: None Musculoskeletal: Osteopenia with degenerative changes of the spine. No acute osseous pathology. IMPRESSION: 1. Moderate colonic stool burden. No bowel obstruction or active inflammation. 2. Colonic diverticulosis. 3. Multiple pancreatic cystic lesions as described. Further evaluation with MRI is recommended. 4. A 3.3 cm infrarenal abdominal aortic aneurysm. Recommend followup by ultrasound in 3 years. This recommendation follows ACR consensus guidelines: White Paper of the ACR Incidental Findings  Committee II on Vascular Findings. J Am Coll Radiol 2013; 93:[id number] Electronically Signed   By: Anner Crete M.D.   On: 03/05/2018 01:33   IMPRESSION AND PLAN:   A/P: 22G p/w AP/N/V. Lipase 62. CT A/P (-) pancreatic inflammation/abscess, (+) pancreatic cysts. Hypochloremic hyponatremia, hyperglycemia, Cr elevation/CKD IV. -AP/N/V, lipase elevation, pancreatic cysts: Pt p/w 3d Hx worsening nausea, (+)  vomiting, (+) diffuse abdominal discomfort/gaseous distension, severe x1d. Recent UTI, rpt U/A (-). CT A/P report reads, "There are multiple low attenuating/cystic lesions involving the pancreas which are not characterized on this noncontrast CT but may represent side branch IPMNs." There is no mention of pancreatic edema, peripancreatic fluid collection, necrosis or abscess. These findings are likely chronic, and I am not entirely sure her present symptomatology is related to this imaging finding. I wonder if she presented with an acute viral upper GI illness, and was incidentally found to have pancreatic cysts on imaging. The elevated lipase in absence of imaging findings supporting a diagnosis of acute pancreatitis suggests that the elevation is due to N/V. IVF, symptomatic mgmt, pain ctrl. Unclear if pt needs further imaging, as I do not expect aggressive intervention in the context of pt's advanced age, implying imaging may not alter the management/plan of care. -Hyponatremia, hypochloremia, Cr elevation/CKD IV: Na+ 124 on present admission, decreased from 138 on 02/21/2018. Cl- 90 on present admission, decreased from 110 on 02/21/2018. These findings are consistent w/ N/V, dehydration, intravascular volume depletion. IVF. Pt w/ baseline CKD IV; baseline Cr appears to be 1.7-2.0 based on review of prior labs. Pt was in AKI on prior admission (02/17/2018), Cr 3.20. Values have steadily improved since then, improving to 1.92 on 02/21/2018. Cr is 1.76 on present admission. Pt appears to be back to baseline. Monitor BMP, avoid nephrotoxins. -c/w home meds as tolerated. -FEN/GI: Renal diet as tolerated. -DVT PPx: Heparin. -Code status: DNR/DNI. -Disposition: Admission, > 2 midnights.   All the records are reviewed and case discussed with ED provider. Management plans discussed with the patient, family and they are in agreement.  CODE STATUS: DNR/DNI.  TOTAL TIME TAKING CARE OF THIS PATIENT: 75 minutes.     Arta Silence M.D on 03/05/2018 at 6:19 AM  Between 7am to 6pm - Pager - [phone]  After 6pm go to [email or web address] - Proofreader  [facility]  Office  [phone]  CC: Primary care physician; Derinda Late, MD   Note: This dictation was prepared with Dragon dictation along with smaller phrase technology. Any transcriptional errors that result from this process are unintentional.

## 2018-03-05 NOTE — Discharge Summary (Addendum)
[facility] [place] NAME: Tina Curtis    MR#:  [id number]  DATE OF BIRTH:  April 27, 1927  DATE OF ADMISSION:  03/05/2018 ADMITTING PHYSICIAN: Arta Silence, MD  DATE OF DISCHARGE: No discharge date for patient encounter.  PRIMARY CARE PHYSICIAN: Derinda Late, MD    ADMISSION DIAGNOSIS:  Dehydration [E86.0] Hyponatremia [E87.1] Pancreatic cyst [K86.2] Nausea and vomiting, intractability of vomiting not specified, unspecified vomiting type [R11.2]  DISCHARGE DIAGNOSIS:  Active Problems:   Nausea & vomiting   SECONDARY DIAGNOSIS:   Past Medical History:  Diagnosis Date  . Arthritis    Gout  . Hypercholesteremia   . Hypertension   . Neuropathy    feet and lower legs  . Seasonal allergies   . Skin cancer    face    HOSPITAL COURSE:  *Acute constipation With associated nausea and vomiting Resolved Continue Colace twice daily, MiraLAX as needed Status post EGD by gastroenterology-noted nonbleeding ulceration, recommended PPI twice daily for 14-month course with outpatient follow-up with gastroenterology  *Acute on chronic benign essential hypertension Exacerbated due to p.o. antihypertensive medications which were held prior to EGD Discharge held for this reason as patient was having associated dizziness as well  *Acute dizziness Meclizine as needed  *Acute hyponatremia/hypochloremia Treated with IV fluids while in-house, started on salt tabs on day of discharge  *Acute abnormal CT abdomen findings Noted for multiple pancreatic cysts, 3.3 cm AAA Would recommend conservative therapy given advanced age-we will defer further for follow-up with gastroenterology status post discharge  DISCHARGE CONDITIONS:   stable  CONSULTS OBTAINED:  Treatment Team:  Arta Silence, MD  DRUG ALLERGIES:   Allergies  Allergen Reactions  . Penicillins Anaphylaxis    DISCHARGE MEDICATIONS:   Allergies as of  03/05/2018      Reactions   Penicillins Anaphylaxis      Medication List    TAKE these medications   acetaminophen 500 MG tablet Commonly known as:  TYLENOL Take 500 mg by mouth every 6 (six) hours as needed.   ALIGN PO Take by mouth. AM   allopurinol 300 MG tablet Commonly known as:  ZYLOPRIM Take 300 mg by mouth daily. AM   aspirin EC 81 MG tablet Take 81 mg by mouth daily.   buPROPion 150 MG 12 hr tablet Commonly known as:  WELLBUTRIN SR Take 1 tablet (150 mg total) by mouth daily.   ciprofloxacin 500 MG tablet Commonly known as:  CIPRO Take 1 tablet (500 mg total) by mouth daily.   citalopram 20 MG tablet Commonly known as:  CELEXA Take 20 mg by mouth daily.   colchicine 0.6 MG tablet Take 0.6 mg by mouth daily as needed.   docusate sodium 100 MG capsule Commonly known as:  COLACE Take 1 capsule (100 mg total) by mouth 2 (two) times daily.   feeding supplement (GLUCERNA SHAKE) Liqd Take 237 mLs by mouth 3 (three) times daily between meals.   gabapentin 300 MG capsule Commonly known as:  NEURONTIN Take 300 mg by mouth at bedtime.   hydrALAZINE 50 MG tablet Commonly known as:  APRESOLINE Take 1 tablet (50 mg total) by mouth 2 (two) times daily.   ICAPS PO Take by mouth daily.   loratadine 10 MG tablet Commonly known as:  CLARITIN Take 10 mg by mouth daily as needed for allergies. AM   losartan-hydrochlorothiazide 100-25 MG tablet Commonly known as:  HYZAAR Take 1 tablet by mouth daily.   metoprolol succinate  25 MG 24 hr tablet Commonly known as:  TOPROL-XL Take 37.5 mg by mouth at bedtime.   mupirocin ointment 2 % Commonly known as:  BACTROBAN Place 1 application into the nose 2 (two) times daily. Pt needs for one more day.   ondansetron 4 MG disintegrating tablet Commonly known as:  ZOFRAN-ODT Take 4 mg by mouth every 8 (eight) hours as needed for nausea or vomiting.   risperiDONE 0.5 MG disintegrating tablet Commonly known as:  RISPERDAL  M-TABS Take 1 tablet (0.5 mg total) by mouth 2 (two) times daily as needed (psychosis).   simvastatin 20 MG tablet Commonly known as:  ZOCOR Take 20 mg by mouth daily. PM   traZODone 50 MG tablet Commonly known as:  DESYREL Take 0.5 tablets (25 mg total) by mouth at bedtime.   VITAMIN D PO Take by mouth.        DISCHARGE INSTRUCTIONS:  If you experience worsening of your admission symptoms, develop shortness of breath, life threatening emergency, suicidal or homicidal thoughts you must seek medical attention immediately by calling 911 or calling your MD immediately  if symptoms less severe.  You Must read complete instructions/literature along with all the possible adverse reactions/side effects for all the Medicines you take and that have been prescribed to you. Take any new Medicines after you have completely understood and accept all the possible adverse reactions/side effects.   Please note  You were cared for by a hospitalist during your hospital stay. If you have any questions about your discharge medications or the care you received while you were in the hospital after you are discharged, you can call the unit and asked to speak with the hospitalist on call if the hospitalist that took care of you is not available. Once you are discharged, your primary care physician will handle any further medical issues. Please note that NO REFILLS for any discharge medications will be authorized once you are discharged, as it is imperative that you return to your primary care physician (or establish a relationship with a primary care physician if you do not have one) for your aftercare needs so that they can reassess your need for medications and monitor your lab values.    Today   CHIEF COMPLAINT:   Chief Complaint  Patient presents with  . Emesis    HISTORY OF PRESENT ILLNESS:  84 y.o. female with a known history of HTN, HLD, gout, RLS p/w N/V/AP. AAOx3, good historian. She is hard  of hearing, but she can hear satisfactorily if you stand close to her. She endorses chronic intermittent lightheadedness and nausea since July. She tells me she was recently hospitalized from 10/10 to 10/15 for a urinary tract infection and renal failure. She states that when she was discharged on 10/15, she still had some mild nausea, but felt as though she was generally improving. She states that for the past 3d, however, she has been having worse nausea than usual. She endorses PO intolerance and N/V w/ attempted PO intake. She endorses poor intake and dehydration. She endorses diffuse abdominal pain/discomfort and bloating/gaseous distension. She states her symptoms have been especially severe x1d, prompting hospitalization. She endorses a Hx of acute pancreatitis in 1962, but denies having had pancreatitis since then. Lipase 62. CT A/P report reads, "There are multiple low attenuating/cystic lesions involving the pancreas which are not characterized on this noncontrast CT but may represent side branch IPMNs." There is no mention of pancreatic edema, peripancreatic fluid collection, necrosis or abscess.  These findings are likely chronic, and I am not entirely sure her present symptomatology is related to this imaging finding.  VITAL SIGNS:  Blood pressure (!) 158/73, pulse 67, temperature 98 F (36.7 C), temperature source Oral, resp. rate 18, height 5\' 6"  (1.676 m), weight 62.5 kg, SpO2 96 %.  I/O:    Intake/Output Summary (Last 24 hours) at 03/05/2018 1135 Last data filed at 03/05/2018 1005 Gross per 24 hour  Intake 1345.77 ml  Output -  Net 1345.77 ml    PHYSICAL EXAMINATION:  GENERAL:  84 y.o.-year-old patient lying in the bed with no acute distress.  EYES: Pupils equal, round, reactive to light and accommodation. No scleral icterus. Extraocular muscles intact.  HEENT: Head atraumatic, normocephalic. Oropharynx and nasopharynx clear.  NECK:  Supple, no jugular venous distention. No  thyroid enlargement, no tenderness.  LUNGS: Normal breath sounds bilaterally, no wheezing, rales,rhonchi or crepitation. No use of accessory muscles of respiration.  CARDIOVASCULAR: S1, S2 normal. No murmurs, rubs, or gallops.  ABDOMEN: Soft, non-tender, non-distended. Bowel sounds present. No organomegaly or mass.  EXTREMITIES: No pedal edema, cyanosis, or clubbing.  NEUROLOGIC: Cranial nerves II through XII are intact. Muscle strength 5/5 in all extremities. Sensation intact. Gait not checked.  PSYCHIATRIC: The patient is alert and oriented x 3.  SKIN: No obvious rash, lesion, or ulcer.   DATA REVIEW:   CBC Recent Labs  Lab 03/04/18 2228  WBC 9.1  HGB 12.3  HCT 34.9*  PLT 232    Chemistries  Recent Labs  Lab 03/04/18 2228  NA 124*  K 3.9  CL 90*  CO2 22  GLUCOSE 129*  BUN 30*  CREATININE 1.76*  CALCIUM 9.4  AST 35  ALT 24  ALKPHOS 93  BILITOT 0.9    Cardiac Enzymes No results for input(s): TROPONINI in the last 168 hours.  Microbiology Results  Results for orders placed or performed during the hospital encounter of 03/05/18  MRSA PCR Screening     Status: None   Collection Time: 03/05/18  5:05 AM  Result Value Ref Range Status   MRSA by PCR NEGATIVE NEGATIVE Final    Comment:        The GeneXpert MRSA Assay (FDA approved for NASAL specimens only), is one component of a comprehensive MRSA colonization surveillance program. It is not intended to diagnose MRSA infection nor to guide or monitor treatment for MRSA infections. Performed at [facility], [address] [place], [place] [address]     RADIOLOGY:  Ct Abdomen Pelvis Wo Contrast  Result Date: 03/05/2018 CLINICAL DATA:  84 year old female with abdominal pain. EXAM: CT ABDOMEN AND PELVIS WITHOUT CONTRAST TECHNIQUE: Multidetector CT imaging of the abdomen and pelvis was performed following the standard protocol without IV contrast. COMPARISON:  None. FINDINGS: Evaluation of this exam is  limited in the absence of intravenous contrast. Lower chest: There are minimal bibasilar subpleural scarring. There is coronary vascular calcification. No intra-abdominal free air or free fluid. Hepatobiliary: The liver is unremarkable. No calcified gallstone. Pancreas: There are multiple low attenuating/cystic lesions involving the pancreas which are not characterized on this noncontrast CT but may represent side branch IPMNs. Further evaluation with MRI is recommended. The largest measures approximately 16 mm in the proximal body of the pancreas. Spleen: Normal in size without focal abnormality. Adrenals/Urinary Tract: The adrenal glands are unremarkable. Moderate left renal atrophy. There is no hydronephrosis or nephrolithiasis on either side. The visualized ureters and urinary bladder appear unremarkable. Stomach/Bowel: There is extensive  sigmoid and colonic diverticulosis without active inflammatory changes. There is moderate amount of stool throughout the colon. There is no bowel obstruction or active inflammation. Appendectomy. Vascular/Lymphatic: Advanced aortoiliac atherosclerotic disease. A 3.3 cm infrarenal abdominal aortic aneurysm. No portal venous gas. There is no adenopathy. Reproductive: Hysterectomy. No pelvic mass. Other: None Musculoskeletal: Osteopenia with degenerative changes of the spine. No acute osseous pathology. IMPRESSION: 1. Moderate colonic stool burden. No bowel obstruction or active inflammation. 2. Colonic diverticulosis. 3. Multiple pancreatic cystic lesions as described. Further evaluation with MRI is recommended. 4. A 3.3 cm infrarenal abdominal aortic aneurysm. Recommend followup by ultrasound in 3 years. This recommendation follows ACR consensus guidelines: White Paper of the ACR Incidental Findings Committee II on Vascular Findings. J Am Coll Radiol 2013; 86:381-771 Electronically Signed   By: Anner Crete M.D.   On: 03/05/2018 01:33    EKG:   Orders placed or  performed during the hospital encounter of 02/17/18  . ED EKG  . ED EKG  . EKG 12-Lead  . EKG 12-Lead  . EKG 12-Lead  . EKG 12-Lead      Management plans discussed with the patient, family and they are in agreement.  CODE STATUS: dnr    Code Status Orders  (From admission, onward)         Start     Ordered   03/05/18 0355  Do not attempt resuscitation (DNR)  Continuous    Question Answer Comment  In the event of cardiac or respiratory ARREST Do not call a "code blue"   In the event of cardiac or respiratory ARREST Do not perform Intubation, CPR, defibrillation or ACLS   In the event of cardiac or respiratory ARREST Use medication by any route, position, wound care, and other measures to relive pain and suffering. May use oxygen, suction and manual treatment of airway obstruction as needed for comfort.   Comments RN MAY PRONOUNCE      03/05/18 0354        Code Status History    Date Active Date Inactive Code Status Order ID Comments User Context   02/17/2018 1857 02/22/2018 1755 DNR [id number]  Nicholes Mango, MD ED    Advance Directive Documentation     Most Recent Value  Type of Advance Directive  Out of facility DNR (pink MOST or yellow form)  Pre-existing out of facility DNR order (yellow form or pink MOST form)  -  "MOST" Form in Place?  -      TOTAL TIME TAKING CARE OF THIS PATIENT: 40 minutes.    Avel Peace Kaye Luoma M.D on 03/05/2018 at 11:35 AM  Between 7am to 6pm - Pager - [phone]  After 6pm go to [email or web address] - password [email or web address] [facility]  Office  [phone]  CC: Primary care physician; Derinda Late, MD   Note: This dictation was prepared with Dragon dictation along with smaller phrase technology. Any transcriptional errors that result from this process are unintentional.

## 2018-03-05 NOTE — Progress Notes (Signed)
Patient's family requested a GI consult because her nausea and vomiting have been for multiple months unresolved and not related to medications or foods.  She has been regular with all her medicines according to her niece.  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  Patient's niece reported that the ED MD said there was "something with her pancrease".  Family asked lots of questions.  RN requested family refer to the MD with their questions.  Text page was sent to Dr. Jerelyn Charles earlier.  RN will try to contact him again with patient's family request for a GI consult.  Phillis Knack, RN

## 2018-03-06 DIAGNOSIS — K862 Cyst of pancreas: Secondary | ICD-10-CM

## 2018-03-06 DIAGNOSIS — R112 Nausea with vomiting, unspecified: Secondary | ICD-10-CM

## 2018-03-06 LAB — BASIC METABOLIC PANEL
Anion gap: 4 — ABNORMAL LOW (ref 5–15)
BUN: 31 mg/dL — AB (ref 8–23)
CHLORIDE: 102 mmol/L (ref 98–111)
CO2: 27 mmol/L (ref 22–32)
Calcium: 8.5 mg/dL — ABNORMAL LOW (ref 8.9–10.3)
Creatinine, Ser: 1.84 mg/dL — ABNORMAL HIGH (ref 0.44–1.00)
GFR, EST AFRICAN AMERICAN: 27 mL/min — AB (ref >60)
GFR, EST NON AFRICAN AMERICAN: 23 mL/min — AB (ref >60)
Glucose, Bld: 100 mg/dL — ABNORMAL HIGH (ref 70–99)
POTASSIUM: 4 mmol/L (ref 3.5–5.1)
Sodium: 133 mmol/L — ABNORMAL LOW (ref 135–145)

## 2018-03-06 LAB — FOLATE: FOLATE: 19.7 ng/mL (ref >5.9)

## 2018-03-06 LAB — FERRITIN: Ferritin: 115 ng/mL (ref 11–307)

## 2018-03-06 MED ORDER — OLANZAPINE 2.5 MG PO TABS
2.5000 mg | ORAL_TABLET | Freq: Every day | ORAL | Status: DC
  Administered 2018-03-06 – 2018-03-07 (×2): 2.5 mg via ORAL
  Filled 2018-03-06 (×3): qty 1

## 2018-03-06 MED ORDER — PANTOPRAZOLE SODIUM 40 MG PO TBEC
40.0000 mg | DELAYED_RELEASE_TABLET | Freq: Two times a day (BID) | ORAL | Status: DC
  Administered 2018-03-06 – 2018-03-08 (×2): 40 mg via ORAL
  Filled 2018-03-06 (×3): qty 1

## 2018-03-06 MED ORDER — POLYETHYLENE GLYCOL 3350 17 G PO PACK
17.0000 g | PACK | Freq: Every day | ORAL | Status: DC
  Administered 2018-03-06 – 2018-03-08 (×2): 17 g via ORAL
  Filled 2018-03-06 (×2): qty 1

## 2018-03-06 NOTE — Consult Note (Signed)
Cephas Darby, MD [address]  [place]  [place], [place] [address]  Main: [phone]  Fax: [phone]    Gastroenterology Consultation  Referring Provider:     No ref. provider found Primary Care Physician:  Derinda Late, MD Primary Gastroenterologist: None Reason for Consultation:     Intractable nausea, intermittent diarrhea        HPI:   Tina Curtis is a 84 y.o. female referred by Dr. Derinda Late, MD  for consultation & management of approximately 3 weeks history of intractable nausea and intermittent diarrhea, nonbloody.  Patient lives in independent living facility at [facility].  Her husband passed away about 5 months ago.  Since then, she has been living by herself, has been sad and lost interest in any activities.  She is started on antidepressant medications Wellbutrin and citalopram by her PCP, she thinks her sadness has improved.  According to her family, patient has been complaining of intermittent nausea, dizziness, had a rapid decline in her functional status since death of her husband and particularly in the last 3 weeks.  She used to drive car by herself.  She also lost 5 pounds in the last 2 weeks.  She was recently admitted to [facility] secondary to UTI treated with Cipro.  Nausea has been progressively getting worse for the last 3 weeks associated with intermittent episodes of diarrhea and constipation.  Has been started on Zofran which temporarily helped.  According to her niece and her daughter, patient has been generally suffering from intermittent diarrhea.  She was told that she has IBS.  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  She has history of hypertension, hyperlipidemia, stage III chronic kidney disease.  Over the last 1 to 2 years her kidney function has been slowly declining.  She does not have a nephrologist.  Patient denies abdominal pain, does report intermittent bloating.  She underwent CT head as well as CT abdomen  and pelvis.  CT head was unremarkable, CT abdomen revealed diffuse diverticulosis, IPMN, moderate stool burden. Patient denies any new medications other than Cipro for UTI. Patient denies abdominal pain, fever, chills  For the last 2 days, patient reports that her nausea improved.  Today, she was able to eat regular breakfast. Patient's niece is bedside and connected me to talk to patient's daughter over the phone during my interview with the patient  NSAIDs: None  Antiplts/Anticoagulants/Anti thrombotics: None  GI Procedures: Colonoscopy at [facility] in 2002 Report not available  she reports having had an upper endoscopy prior to colonoscopy  Past Medical History:  Diagnosis Date  . Arthritis    Gout  . Hypercholesteremia   . Hypertension   . Neuropathy    feet and lower legs  . Seasonal allergies   . Skin cancer    face    Past Surgical History:  Procedure Laterality Date  . ABDOMINAL HYSTERECTOMY  2000  . APPENDECTOMY    . CATARACT EXTRACTION Left   . CATARACT EXTRACTION W/PHACO Right 10/24/2014   Procedure: CATARACT EXTRACTION PHACO AND INTRAOCULAR LENS PLACEMENT (IOC);  Surgeon: Leandrew Koyanagi, MD;  Location: [place];  Service: Ophthalmology;  Laterality: Right;    Prior to Admission medications   Medication Sig Start Date End Date Taking? Authorizing Provider  allopurinol (ZYLOPRIM) 300 MG tablet Take 300 mg by mouth daily. AM   Yes [provider]  aspirin EC 81 MG tablet Take 81 mg by mouth daily.   Yes  [provider]  buPROPion (WELLBUTRIN SR) 150 MG 12 hr tablet Take 1 tablet (150 mg total) by mouth daily. 02/22/18  Yes Epifanio Lesches, MD  citalopram (CELEXA) 20 MG tablet Take 20 mg by mouth daily. 02/14/18  Yes [provider]  colchicine 0.6 MG tablet Take 0.6 mg by mouth daily as needed.   Yes [provider]  feeding supplement, GLUCERNA SHAKE, (GLUCERNA SHAKE) LIQD Take 237 mLs by mouth 3 (three) times  daily between meals. 02/22/18  Yes Epifanio Lesches, MD  gabapentin (NEURONTIN) 300 MG capsule Take 300 mg by mouth at bedtime.    Yes [provider]  hydrALAZINE (APRESOLINE) 50 MG tablet Take 1 tablet (50 mg total) by mouth 2 (two) times daily. 02/22/18  Yes Epifanio Lesches, MD  losartan-hydrochlorothiazide (HYZAAR) 100-25 MG tablet Take 1 tablet by mouth daily.   Yes [provider]  metoprolol succinate (TOPROL-XL) 25 MG 24 hr tablet Take 37.5 mg by mouth at bedtime.    Yes [provider]  mupirocin ointment (BACTROBAN) 2 % Place 1 application into the nose 2 (two) times daily. Pt needs for one more day. 02/22/18  Yes Epifanio Lesches, MD  ondansetron (ZOFRAN-ODT) 4 MG disintegrating tablet Take 4 mg by mouth every 8 (eight) hours as needed for nausea or vomiting.   Yes [provider]  simvastatin (ZOCOR) 20 MG tablet Take 20 mg by mouth daily. PM   Yes [provider]  acetaminophen (TYLENOL) 500 MG tablet Take 500 mg by mouth every 6 (six) hours as needed.    [provider]  Cholecalciferol (VITAMIN D PO) Take by mouth.    [provider]  ciprofloxacin (CIPRO) 500 MG tablet Take 1 tablet (500 mg total) by mouth daily. Patient not taking: Reported on 03/05/2018 02/22/18   Epifanio Lesches, MD  docusate sodium (COLACE) 100 MG capsule Take 1 capsule (100 mg total) by mouth 2 (two) times daily. 03/05/18 03/05/19  Salary, Avel Peace, MD  loratadine (CLARITIN) 10 MG tablet Take 10 mg by mouth daily as needed for allergies. AM    [provider]  Multiple Vitamins-Minerals (ICAPS PO) Take by mouth daily.    [provider]  Probiotic Product (ALIGN PO) Take by mouth. AM    [provider]  risperiDONE (RISPERDAL M-TABS) 0.5 MG disintegrating tablet Take 1 tablet (0.5 mg total) by mouth 2 (two) times daily as needed (psychosis). Patient not taking: Reported on 03/05/2018 02/22/18   Epifanio Lesches, MD  traZODone (DESYREL) 50 MG tablet Take 0.5 tablets (25 mg total) by mouth at bedtime. 02/22/18   Epifanio Lesches, MD    Current Facility-Administered Medications:  .  acetaminophen (TYLENOL) tablet 650 mg, 650 mg, Oral, Q6H PRN, 650 mg at 03/05/18 2102 **OR** acetaminophen (TYLENOL) suppository 650 mg, 650 mg, Rectal, Q6H PRN, Jodell Cipro, Prasanna, MD .  allopurinol (ZYLOPRIM) tablet 50 mg, 50 mg, Oral, Daily, Arta Silence, MD, Stopped at 03/06/18 1229 .  aspirin EC tablet 81 mg, 81 mg, Oral, Daily, Jodell Cipro, Prasanna, MD, 81 mg at 03/06/18 1109 .  bisacodyl (DULCOLAX) EC tablet 5 mg, 5 mg, Oral, Daily PRN, Arta Silence, MD .  buPROPion (WELLBUTRIN SR) 12 hr tablet 150 mg, 150 mg, Oral, Daily, Arta Silence, MD, Stopped at 03/06/18 1230 .  colchicine tablet 0.6 mg, 0.6 mg, Oral, Daily PRN, Jodell Cipro, Prasanna, MD .  dextrose 5 %-0.9 % sodium chloride infusion, , Intravenous, Continuous, Salary, Montell D, MD, Last Rate: 75 mL/hr at 03/05/18 2345 .  feeding supplement (GLUCERNA SHAKE) (GLUCERNA SHAKE) liquid 237 mL, 237 mL, Oral, TID BM, Sridharan, Prasanna, MD, 237 mL at 03/06/18 1000 .  gabapentin (NEURONTIN) capsule 100 mg, 100 mg, Oral, BID, Jodell Cipro, Prasanna, MD, 100 mg at 03/06/18 1100 .  heparin injection 5,000 Units, 5,000 Units, Subcutaneous, Q8H, Sridharan, Prasanna, MD, 5,000 Units at 03/06/18 0639 .  hydrALAZINE (APRESOLINE) tablet 50 mg, 50 mg, Oral, BID, Jodell Cipro, Prasanna, MD, 50 mg at 03/06/18 1109 .  losartan (COZAAR) tablet 100 mg, 100 mg, Oral, Daily, 100 mg at 03/06/18 1108 **AND** hydrochlorothiazide (HYDRODIURIL) tablet 25 mg, 25 mg, Oral, Daily, Jodell Cipro, Prasanna, MD, 25 mg at 03/06/18 1109 .  HYDROmorphone (DILAUDID) injection 0.5 mg, 0.5 mg, Intravenous, Q3H PRN, Jodell Cipro, Prasanna, MD .  metoprolol succinate (TOPROL-XL) 24 hr tablet 37.5 mg, 37.5 mg, Oral, QHS, Sridharan, Prasanna, MD, 37.5 mg at 03/05/18 2101 .  OLANZapine  (ZYPREXA) tablet 2.5 mg, 2.5 mg, Oral, QHS, Vanga, Tally Due, MD .  pantoprazole (PROTONIX) EC tablet 40 mg, 40 mg, Oral, BID AC, Vanga, Tally Due, MD .  polyethylene glycol (MIRALAX / GLYCOLAX) packet 17 g, 17 g, Oral, Daily, Salary, Montell D, MD, 17 g at 03/06/18 1106 .  prochlorperazine (COMPAZINE) injection 10 mg, 10 mg, Intravenous, Q6H PRN, Jodell Cipro, Prasanna, MD .  senna-docusate (Senokot-S) tablet 1 tablet, 1 tablet, Oral, QHS PRN, Jodell Cipro, Prasanna, MD .  simvastatin (ZOCOR) tablet 20 mg, 20 mg, Oral, q1800, Sridharan, Prasanna, MD .  sodium chloride tablet 2 g, 2 g, Oral, TID WC, Salary, Montell D, MD, 2 g at 03/06/18 1224 .  traZODone (DESYREL) tablet 25 mg, 25 mg, Oral, QHS, Sridharan, Prasanna, MD, 25 mg at 03/05/18 2345  History reviewed. No pertinent family history.   Social History   Tobacco Use  . Smoking status: Never Smoker  . Smokeless tobacco: Never Used  Substance Use Topics  . Alcohol use: No  . Drug use: Not on file    Allergies as of 03/04/2018 - Review Complete 02/17/2018  Allergen Reaction Noted  . Penicillins Anaphylaxis 10/15/2014    Review of Systems:    All systems reviewed and negative except where noted in HPI.   Physical Exam:  BP (!) 146/71 (BP Location: Left Arm)   Pulse (!) 59   Temp (!) 97.4 F (36.3 C) (Oral)   Resp 16   Ht 5\' 6"  (1.676 m)   Wt 62.5 kg   SpO2 95%   BMI 22.24 kg/m  No LMP recorded. Patient has had a hysterectomy.  General:   Alert,  Well-developed, well-nourished, pleasant and cooperative in NAD Head:  Normocephalic and atraumatic. Eyes:  Sclera clear, no icterus.   Conjunctiva pink. Ears:  Normal auditory acuity. Nose:  No deformity, discharge, or lesions. Mouth:  No deformity or lesions,oropharynx pink & moist. Neck:  Supple; no masses or thyromegaly. Lungs:  Respirations even and unlabored.  Clear throughout to auscultation.   No wheezes, crackles, or rhonchi. No acute distress. Heart:  Regular rate  and rhythm; no murmurs, clicks, rubs, or gallops. Abdomen:  Normal bowel sounds. Soft, non-tender and slightly distended without masses, hepatosplenomegaly or hernias noted.  No guarding or rebound tenderness.   Rectal: Not performed Msk:  Symmetrical without gross deformities. Good, equal movement & strength bilaterally. Pulses:  Normal pulses noted. Extremities:  No clubbing or edema.  No cyanosis. Neurologic:  Alert and oriented x3;  grossly normal neurologically. Skin:  Intact without significant lesions or rashes. No jaundice. Lymph Nodes:  No significant cervical  adenopathy. Psych:  Alert and cooperative. Normal mood and affect.  Imaging Studies: Reviewed  Assessment and Plan:   Tina Curtis is a 84 y.o. Caucasian female with history of hypertension, chronic kidney disease, hyperlipidemia, recent UTI treated with Cipro admitted with intractable nausea, weakness.  Intractable nausea, intermittent diarrhea It appears that her intractable nausea could be multifactorial.  Combination of depression, uremia secondary to progressive chronic kidney disease, GI etiology such as H. pylori gastritis or peptic ulcer disease or acid reflux or idiopathic gastroparesis  Patient and her family is interested in pursuing upper endoscopy N.p.o. 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  The largest cyst measures 1.6 cm in the proximal body of the pancreas Recommend MRI pancreas protocol as outpatient  Will follow along with you   Cephas Darby, MD

## 2018-03-06 NOTE — Progress Notes (Signed)
[facility] [place] NAME: Tina Curtis    MR#:  [id number]  DATE OF BIRTH:  01/20/1927  SUBJECTIVE:  CHIEF COMPLAINT:   Chief Complaint  Patient presents with  . Emesis  Patient without complaint, patient and the patient's daughter/family strongly requesting gastroenterology evaluation regarding episodes of diarrhea and constipation  REVIEW OF SYSTEMS:  CONSTITUTIONAL: No fever, fatigue or weakness.  EYES: No blurred or double vision.  EARS, NOSE, AND THROAT: No tinnitus or ear pain.  RESPIRATORY: No cough, shortness of breath, wheezing or hemoptysis.  CARDIOVASCULAR: No chest pain, orthopnea, edema.  GASTROINTESTINAL: No nausea, vomiting, diarrhea or abdominal pain.  GENITOURINARY: No dysuria, hematuria.  ENDOCRINE: No polyuria, nocturia,  HEMATOLOGY: No anemia, easy bruising or bleeding SKIN: No rash or lesion. MUSCULOSKELETAL: No joint pain or arthritis.   NEUROLOGIC: No tingling, numbness, weakness.  PSYCHIATRY: No anxiety or depression.   ROS  DRUG ALLERGIES:   Allergies  Allergen Reactions  . Penicillins Anaphylaxis    VITALS:  Blood pressure (!) 146/71, pulse (!) 59, temperature (!) 97.4 F (36.3 C), temperature source Oral, resp. rate 16, height 5\' 6"  (1.676 m), weight 62.5 kg, SpO2 95 %.  PHYSICAL EXAMINATION:  GENERAL:  84 y.o.-year-old patient lying in the bed with no acute distress.  EYES: Pupils equal, round, reactive to light and accommodation. No scleral icterus. Extraocular muscles intact.  HEENT: Head atraumatic, normocephalic. Oropharynx and nasopharynx clear.  NECK:  Supple, no jugular venous distention. No thyroid enlargement, no tenderness.  LUNGS: Normal breath sounds bilaterally, no wheezing, rales,rhonchi or crepitation. No use of accessory muscles of respiration.  CARDIOVASCULAR: S1, S2 normal. No murmurs, rubs, or gallops.  ABDOMEN: Soft, nontender, nondistended. Bowel sounds present. No organomegaly  or mass.  EXTREMITIES: No pedal edema, cyanosis, or clubbing.  NEUROLOGIC: Cranial nerves II through XII are intact. Muscle strength 5/5 in all extremities. Sensation intact. Gait not checked.  PSYCHIATRIC: The patient is alert and oriented x 3.  SKIN: No obvious rash, lesion, or ulcer.   Physical Exam LABORATORY PANEL:   CBC Recent Labs  Lab 03/04/18 2228  WBC 9.1  HGB 12.3  HCT 34.9*  PLT 232   ------------------------------------------------------------------------------------------------------------------  Chemistries  Recent Labs  Lab 03/04/18 2228  03/06/18 0604  NA 124*   < > 133*  K 3.9   < > 4.0  CL 90*   < > 102  CO2 22   < > 27  GLUCOSE 129*   < > 100*  BUN 30*   < > 31*  CREATININE 1.76*   < > 1.84*  CALCIUM 9.4   < > 8.5*  AST 35  --   --   ALT 24  --   --   ALKPHOS 93  --   --   BILITOT 0.9  --   --    < > = values in this interval not displayed.   ------------------------------------------------------------------------------------------------------------------  Cardiac Enzymes No results for input(s): TROPONINI in the last 168 hours. ------------------------------------------------------------------------------------------------------------------  RADIOLOGY:  Ct Abdomen Pelvis Wo Contrast  Result Date: 03/05/2018 CLINICAL DATA:  84 year old female with abdominal pain. EXAM: CT ABDOMEN AND PELVIS WITHOUT CONTRAST TECHNIQUE: Multidetector CT imaging of the abdomen and pelvis was performed following the standard protocol without IV contrast. COMPARISON:  None. FINDINGS: Evaluation of this exam is limited in the absence of intravenous contrast. Lower chest: There are minimal bibasilar subpleural scarring. There is coronary vascular calcification. No intra-abdominal free air or free  fluid. Hepatobiliary: The liver is unremarkable. No calcified gallstone. Pancreas: There are multiple low attenuating/cystic lesions involving the pancreas which are not  characterized on this noncontrast CT but may represent side branch IPMNs. Further evaluation with MRI is recommended. The largest measures approximately 16 mm in the proximal body of the pancreas. Spleen: Normal in size without focal abnormality. Adrenals/Urinary Tract: The adrenal glands are unremarkable. Moderate left renal atrophy. There is no hydronephrosis or nephrolithiasis on either side. The visualized ureters and urinary bladder appear unremarkable. Stomach/Bowel: There is extensive sigmoid and colonic diverticulosis without active inflammatory changes. There is moderate amount of stool throughout the colon. There is no bowel obstruction or active inflammation. Appendectomy. Vascular/Lymphatic: Advanced aortoiliac atherosclerotic disease. A 3.3 cm infrarenal abdominal aortic aneurysm. No portal venous gas. There is no adenopathy. Reproductive: Hysterectomy. No pelvic mass. Other: None Musculoskeletal: Osteopenia with degenerative changes of the spine. No acute osseous pathology. IMPRESSION: 1. Moderate colonic stool burden. No bowel obstruction or active inflammation. 2. Colonic diverticulosis. 3. Multiple pancreatic cystic lesions as described. Further evaluation with MRI is recommended. 4. A 3.3 cm infrarenal abdominal aortic aneurysm. Recommend followup by ultrasound in 3 years. This recommendation follows ACR consensus guidelines: White Paper of the ACR Incidental Findings Committee II on Vascular Findings. J Am Coll Radiol 2013; 16:109-604 Electronically Signed   By: Anner Crete M.D.   On: 03/05/2018 01:33    ASSESSMENT AND PLAN:  *Acute constipation With associated nausea and vomiting Resolved Continue bowel regimen, gastroenterology to see per patient/family request  *Acute hyponatremia/hypochloremia Improved Treated with IV fluids while in-house and salt tabs  *Acute abnormal CT abdomen findings Noted for multiple pancreatic cysts, 3.3 cm AAA Would recommend conservative therapy  given advanced age Gastroenterology to see  Disposition pending gastroenterology recommendations  All the records are reviewed and case discussed with Care Management/Social Workerr. Management plans discussed with the patient, family and they are in agreement.  CODE STATUS: DNR  TOTAL TIME TAKING CARE OF THIS PATIENT: 35 minutes.     POSSIBLE D/C IN 1-2 DAYS, DEPENDING ON CLINICAL CONDITION.   Avel Peace Samik Balkcom M.D on 03/06/2018   Between 7am to 6pm - Pager - [phone]  After 6pm go to [email or web address] - password [email or web address] [facility]  Office  [phone]  CC: Primary care physician; Derinda Late, MD  Note: This dictation was prepared with Dragon dictation along with smaller phrase technology. Any transcriptional errors that result from this process are unintentional.

## 2018-03-07 ENCOUNTER — Inpatient Hospital Stay: Payer: Medicare Other

## 2018-03-07 ENCOUNTER — Encounter
Admission: EM | Disposition: A | Discharge status: Home Health | Payer: Self-pay | Source: Home / Self Care | Attending: Family Medicine

## 2018-03-07 ENCOUNTER — Encounter: Payer: Self-pay | Admitting: Anesthesiology

## 2018-03-07 ENCOUNTER — Inpatient Hospital Stay: Payer: Medicare Other | Admitting: Anesthesiology

## 2018-03-07 DIAGNOSIS — K3189 Other diseases of stomach and duodenum: Secondary | ICD-10-CM

## 2018-03-07 DIAGNOSIS — K259 Gastric ulcer, unspecified as acute or chronic, without hemorrhage or perforation: Secondary | ICD-10-CM

## 2018-03-07 DIAGNOSIS — R110 Nausea: Secondary | ICD-10-CM

## 2018-03-07 HISTORY — PX: ESOPHAGOGASTRODUODENOSCOPY: SHX5428

## 2018-03-07 LAB — VITAMIN B12: Vitamin B-12: 401 pg/mL (ref 180–914)

## 2018-03-07 SURGERY — EGD (ESOPHAGOGASTRODUODENOSCOPY)
Anesthesia: General

## 2018-03-07 MED ORDER — PROPOFOL 10 MG/ML IV BOLUS
INTRAVENOUS | Status: DC | PRN
  Administered 2018-03-07 (×3): 20 mg via INTRAVENOUS
  Administered 2018-03-07: 50 mg via INTRAVENOUS

## 2018-03-07 MED ORDER — SODIUM CHLORIDE 0.9 % IV SOLN
INTRAVENOUS | Status: DC
  Administered 2018-03-07 (×2): via INTRAVENOUS

## 2018-03-07 MED ORDER — SODIUM CHLORIDE 0.9 % IV SOLN: INTRAVENOUS | Status: DC | PRN

## 2018-03-07 MED ORDER — LIDOCAINE HCL (CARDIAC) PF 100 MG/5ML IV SOSY
PREFILLED_SYRINGE | INTRAVENOUS | Status: DC | PRN
  Administered 2018-03-07: 60 mg via INTRAVENOUS

## 2018-03-07 MED ORDER — PANTOPRAZOLE SODIUM 40 MG PO TBEC: 40.0000 mg | DELAYED_RELEASE_TABLET | Freq: Two times a day (BID) | ORAL | 2 refills | Status: DC

## 2018-03-07 MED ORDER — HYDRALAZINE HCL 20 MG/ML IJ SOLN
10.0000 mg | Freq: Four times a day (QID) | INTRAMUSCULAR | Status: DC | PRN
  Filled 2018-03-07: qty 1

## 2018-03-07 MED ORDER — POLYETHYLENE GLYCOL 3350 17 G PO PACK: 17.0000 g | PACK | Freq: Every day | ORAL | 0 refills | Status: DC

## 2018-03-07 NOTE — Care Management Note (Signed)
Case Management Note  Patient Details  Name: Tina Curtis MRN: [id number] Date of Birth: June 24, 1926  Subjective/Objective:       Patient from [place].  Recently admitted with UTI for a 5 day hospitalization.  Presented to ED with N/V on 10/26.  Current with PCP.  Niece from [place] is here with Patient, Tina Curtis [id number].   Has a Rolator and a shower chair at at home.  Denies difficulties with obtaining medications or with transportation.  Open to [facility] for PT, SN, OT and aide.  Patient would like more education on renal diet.              Action/Plan: Will notified [facility] upon discharge.   Will ask for dietary consult.    Expected Discharge Date:  03/05/18               Expected Discharge Plan:  [facility]  In-House Referral:     Discharge planning Services  CM Consult  Post Acute Care Choice:  Resumption of Svcs/PTA Provider Choice offered to:     DME Arranged:    DME Agency:     HH Arranged:    [facility] Agency:  [facility]  Status of Service:  Completed, signed off  If discussed at [facility] of Stay Meetings, dates discussed:    Additional Comments:  Elza Rafter, RN 03/07/2018, 8:33 AM

## 2018-03-07 NOTE — Anesthesia Preprocedure Evaluation (Signed)
Anesthesia Evaluation  Patient identified by MRN, date of birth, ID band Patient awake    Reviewed: Allergy & Precautions, H&P , NPO status , Patient's Chart, lab work & pertinent test results  History of Anesthesia Complications Negative for: history of anesthetic complications  Airway Mallampati: III  TM Distance: <3 FB Neck ROM: limited    Dental  (+) Chipped, Poor Dentition   Pulmonary neg pulmonary ROS, neg shortness of breath,           Cardiovascular Exercise Tolerance: Good hypertension, (-) angina(-) Past MI and (-) DOE      Neuro/Psych negative neurological ROS  negative psych ROS   GI/Hepatic negative GI ROS, Neg liver ROS,   Endo/Other  negative endocrine ROS  Renal/GU CRFRenal disease  negative genitourinary   Musculoskeletal  (+) Arthritis ,   Abdominal   Peds  Hematology negative hematology ROS (+)   Anesthesia Other Findings Patient is NPO appropriate and reports no nausea or vomiting today.   Past Medical History: No date: Arthritis     Comment:  Gout No date: Hypercholesteremia No date: Hypertension No date: Neuropathy     Comment:  feet and lower legs No date: Seasonal allergies No date: Skin cancer     Comment:  face  Past Surgical History: 2000: ABDOMINAL HYSTERECTOMY No date: APPENDECTOMY No date: CATARACT EXTRACTION; Left 10/24/2014: CATARACT EXTRACTION W/PHACO; Right     Comment:  Procedure: CATARACT EXTRACTION PHACO AND INTRAOCULAR               LENS PLACEMENT (IOC);  Surgeon: Leandrew Koyanagi, MD;               Location: [facility];  Service: Ophthalmology;                Laterality: Right;  BMI    Body Mass Index:  22.24 kg/m      Reproductive/Obstetrics negative OB ROS                             Anesthesia Physical Anesthesia Plan  ASA: III  Anesthesia Plan: General   Post-op Pain Management:    Induction:  Intravenous  PONV Risk Score and Plan: Propofol infusion and TIVA  Airway Management Planned: Natural Airway and Nasal Cannula  Additional Equipment:   Intra-op Plan:   Post-operative Plan:   Informed Consent: I have reviewed the patients History and Physical, chart, labs and discussed the procedure including the risks, benefits and alternatives for the proposed anesthesia with the patient or authorized representative who has indicated his/her understanding and acceptance.   Dental Advisory Given  Plan Discussed with: Anesthesiologist, CRNA and Surgeon  Anesthesia Plan Comments: (Plan to suspend DNR for procedure.  Patient and family voiced understanding.  Patient consented for risks of anesthesia including but not limited to:  - adverse reactions to medications - risk of intubation if required - damage to teeth, lips or other oral mucosa - sore throat or hoarseness - Damage to heart, brain, lungs or loss of life  Patient voiced understanding.)        Anesthesia Quick Evaluation

## 2018-03-07 NOTE — Consult Note (Signed)
[facility]  CONSULT NOTE    Date: 03/07/2018                  Patient Name:  Tina Curtis  MRN: [id number]  [name]: 11-14-1926  Age / Sex: 84 y.o., female         PCP: Derinda Late, MD                 Service Requesting Consult: Dr. Jerelyn Charles                 Reason for Consult: Chronic kidney disease stage IV            History of Present Illness: Tina Curtis is a 84 y.o. white female with hypertension, depression, gout, hyperlipidemia, who was admitted to [facility] on 03/05/2018 for Dehydration [E86.0] Hyponatremia [E87.1] Pancreatic cyst [K86.2] Nausea and vomiting, intractability of vomiting not specified, unspecified vomiting type [R11.2]  Patient's niece is at bedside who assists with history taking. Patient reports several months of severe vomiting and nausea limiting her PO intake.   Patient was found to have hyponatremia and started on NS at 51mL/hr   Medications: Outpatient medications: Medications Prior to Admission  Medication Sig Dispense Refill Last Dose  . allopurinol (ZYLOPRIM) 300 MG tablet Take 300 mg by mouth daily. AM   03/04/2018 at Unknown time  . aspirin EC 81 MG tablet Take 81 mg by mouth daily.   03/04/2018 at Unknown time  . buPROPion (WELLBUTRIN SR) 150 MG 12 hr tablet Take 1 tablet (150 mg total) by mouth daily. 30 tablet 0 03/04/2018 at Unknown time  . citalopram (CELEXA) 20 MG tablet Take 20 mg by mouth daily.   03/04/2018 at Unknown time  . colchicine 0.6 MG tablet Take 0.6 mg by mouth daily as needed.   ptn at prn  . feeding supplement, GLUCERNA SHAKE, (GLUCERNA SHAKE) LIQD Take 237 mLs by mouth 3 (three) times daily between meals. 30 Can 0 03/04/2018 at Unknown time  . gabapentin (NEURONTIN) 300 MG capsule Take 300 mg by mouth at bedtime.    Past Week at Unknown time  . hydrALAZINE (APRESOLINE) 50 MG tablet Take 1 tablet (50 mg total) by mouth 2 (two) times daily. 30 tablet 0 03/04/2018 at Unknown time  .  losartan-hydrochlorothiazide (HYZAAR) 100-25 MG tablet Take 1 tablet by mouth daily.   03/04/2018 at Unknown time  . metoprolol succinate (TOPROL-XL) 25 MG 24 hr tablet Take 37.5 mg by mouth at bedtime.    03/04/2018 at Unknown time  . mupirocin ointment (BACTROBAN) 2 % Place 1 application into the nose 2 (two) times daily. Pt needs for one more day. 1 g 0 03/04/2018 at Unknown time  . ondansetron (ZOFRAN-ODT) 4 MG disintegrating tablet Take 4 mg by mouth every 8 (eight) hours as needed for nausea or vomiting.   03/04/2018 at Unknown time  . simvastatin (ZOCOR) 20 MG tablet Take 20 mg by mouth daily. PM   03/04/2018 at Unknown time  . acetaminophen (TYLENOL) 500 MG tablet Take 500 mg by mouth every 6 (six) hours as needed.   prn at prn  . Cholecalciferol (VITAMIN D PO) Take by mouth.   Not Taking at Unknown time  . ciprofloxacin (CIPRO) 500 MG tablet Take 1 tablet (500 mg total) by mouth daily. (Patient not taking: Reported on 03/05/2018) 7 tablet 0 Completed Course at Unknown time  . loratadine (CLARITIN) 10 MG tablet Take 10 mg by mouth daily as  needed for allergies. AM   Not Taking at Unknown time  . Multiple Vitamins-Minerals (ICAPS PO) Take by mouth daily.   Not Taking at Unknown time  . Probiotic Product (ALIGN PO) Take by mouth. AM   Not Taking at Unknown time  . risperiDONE (RISPERDAL M-TABS) 0.5 MG disintegrating tablet Take 1 tablet (0.5 mg total) by mouth 2 (two) times daily as needed (psychosis). (Patient not taking: Reported on 03/05/2018) 30 tablet 0 Not Taking at Unknown time  . traZODone (DESYREL) 50 MG tablet Take 0.5 tablets (25 mg total) by mouth at bedtime. 30 tablet 0     Current medications: Current Facility-Administered Medications  Medication Dose Route Frequency Provider Last Rate Last Dose  . acetaminophen (TYLENOL) tablet 650 mg  650 mg Oral Q6H PRN Arta Silence, MD   650 mg at 03/06/18 1830   Or  . acetaminophen (TYLENOL) suppository 650 mg  650 mg Rectal Q6H  PRN Arta Silence, MD      . allopurinol (ZYLOPRIM) tablet 50 mg  50 mg Oral Daily Arta Silence, MD   Stopped at 03/06/18 1229  . aspirin EC tablet 81 mg  81 mg Oral Daily Arta Silence, MD   81 mg at 03/06/18 1109  . bisacodyl (DULCOLAX) EC tablet 5 mg  5 mg Oral Daily PRN Arta Silence, MD   5 mg at 03/06/18 1654  . colchicine tablet 0.6 mg  0.6 mg Oral Daily PRN Arta Silence, MD      . feeding supplement (GLUCERNA SHAKE) ([place]) liquid 237 mL  237 mL Oral TID BM Arta Silence, MD   237 mL at 03/06/18 2145  . gabapentin (NEURONTIN) capsule 100 mg  100 mg Oral BID Arta Silence, MD   100 mg at 03/06/18 2147  . heparin injection 5,000 Units  5,000 Units Subcutaneous Q8H Arta Silence, MD   5,000 Units at 03/06/18 2147  . hydrALAZINE (APRESOLINE) tablet 50 mg  50 mg Oral BID Arta Silence, MD   50 mg at 03/06/18 2147  . losartan (COZAAR) tablet 100 mg  100 mg Oral Daily Arta Silence, MD   100 mg at 03/06/18 1108   And  . hydrochlorothiazide (HYDRODIURIL) tablet 25 mg  25 mg Oral Daily Arta Silence, MD   25 mg at 03/06/18 1109  . HYDROmorphone (DILAUDID) injection 0.5 mg  0.5 mg Intravenous Q3H PRN Arta Silence, MD      . metoprolol succinate (TOPROL-XL) 24 hr tablet 37.5 mg  37.5 mg Oral QHS Arta Silence, MD   37.5 mg at 03/06/18 2147  . OLANZapine (ZYPREXA) tablet 2.5 mg  2.5 mg Oral QHS Lin Landsman, MD   2.5 mg at 03/06/18 2148  . pantoprazole (PROTONIX) EC tablet 40 mg  40 mg Oral BID AC Lin Landsman, MD   40 mg at 03/06/18 1654  . polyethylene glycol (MIRALAX / GLYCOLAX) packet 17 g  17 g Oral Daily Salary, Montell D, MD   17 g at 03/06/18 1106  . prochlorperazine (COMPAZINE) injection 10 mg  10 mg Intravenous Q6H PRN Arta Silence, MD      . senna-docusate (Senokot-S) tablet 1 tablet  1 tablet Oral QHS PRN Arta Silence, MD      . simvastatin (ZOCOR) tablet 20 mg  20 mg Oral  q1800 Arta Silence, MD   20 mg at 03/06/18 1654  . traZODone (DESYREL) tablet 25 mg  25 mg Oral QHS Arta Silence, MD   25 mg at 03/06/18 2148  Allergies: Allergies  Allergen Reactions  . Penicillins Anaphylaxis      Past Medical History: Past Medical History:  Diagnosis Date  . Arthritis    Gout  . Hypercholesteremia   . Hypertension   . Neuropathy    feet and lower legs  . Seasonal allergies   . Skin cancer    face     Past Surgical History: Past Surgical History:  Procedure Laterality Date  . ABDOMINAL HYSTERECTOMY  2000  . APPENDECTOMY    . CATARACT EXTRACTION Left   . CATARACT EXTRACTION W/PHACO Right 10/24/2014   Procedure: CATARACT EXTRACTION PHACO AND INTRAOCULAR LENS PLACEMENT (IOC);  Surgeon: Leandrew Koyanagi, MD;  Location: [place];  Service: Ophthalmology;  Laterality: Right;     Family History: History reviewed. No pertinent family history.   Social History: Social History   Socioeconomic History  . Marital status: Widowed    Spouse name: Not on file  . Number of children: Not on file  . Years of education: Not on file  . Highest education level: Not on file  Occupational History  . Not on file  Social Needs  . Financial resource strain: Not on file  . Food insecurity:    Worry: Not on file    Inability: Not on file  . Transportation needs:    Medical: Not on file    Non-medical: Not on file  Tobacco Use  . Smoking status: Never Smoker  . Smokeless tobacco: Never Used  Substance and Sexual Activity  . Alcohol use: No  . Drug use: Not on file  . Sexual activity: Not on file  Lifestyle  . Physical activity:    Days per week: Not on file    Minutes per session: Not on file  . Stress: Not on file  Relationships  . Social connections:    Talks on phone: Not on file    Gets together: Not on file    Attends religious service: Not on file    Active member of club or organization: Not on file    Attends  meetings of clubs or organizations: Not on file    Relationship status: Not on file  . Intimate partner violence:    Fear of current or ex partner: Not on file    Emotionally abused: Not on file    Physically abused: Not on file    Forced sexual activity: Not on file  Other Topics Concern  . Not on file  Social History Narrative  . Not on file     Review of Systems: Review of Systems  Constitutional: Negative.  Negative for chills, diaphoresis, fever, malaise/fatigue and weight loss.  HENT: Negative.  Negative for congestion, ear discharge, ear pain, hearing loss, nosebleeds, sinus pain, sore throat and tinnitus.   Eyes: Negative.  Negative for blurred vision, double vision, photophobia, pain, discharge and redness.  Respiratory: Negative.  Negative for cough, hemoptysis, sputum production, shortness of breath, wheezing and stridor.   Cardiovascular: Negative.  Negative for chest pain, palpitations, orthopnea, claudication, leg swelling and PND.  Gastrointestinal: Positive for abdominal pain, diarrhea, heartburn, nausea and vomiting. Negative for blood in stool, constipation and melena.  Genitourinary: Negative.  Negative for dysuria, flank pain, frequency, hematuria and urgency.  Musculoskeletal: Negative.  Negative for back pain, falls, joint pain, myalgias and neck pain.  Skin: Negative.  Negative for itching and rash.  Neurological: Negative.  Negative for dizziness, tingling, tremors, sensory change, speech change, focal weakness, seizures, loss of consciousness,  weakness and headaches.  Endo/Heme/Allergies: Negative.  Negative for environmental allergies and polydipsia. Does not bruise/bleed easily.  Psychiatric/Behavioral: Negative.  Negative for depression, hallucinations, memory loss, substance abuse and suicidal ideas. The patient is not nervous/anxious and does not have insomnia.     Vital Signs: Blood pressure (!) 158/63, pulse (!) 59, temperature (!) 97.1 F (36.2 C),  temperature source Tympanic, resp. rate 14, height 5\' 6"  (1.676 m), weight 62.5 kg, SpO2 100 %.  Weight trends: Filed Weights   03/04/18 2212 03/05/18 0449 03/07/18 1428  Weight: 64.9 kg 62.5 kg 62.5 kg    Physical Exam: General: NAD,   Head: Normocephalic, atraumatic. Moist oral mucosal membranes  Eyes: Anicteric, PERRL  Neck: Supple, trachea midline  Lungs:  Clear to auscultation  Heart: Regular rate and rhythm  Abdomen:  +tender to palpation suprapubic  Extremities:  no peripheral edema.  Neurologic: Nonfocal, moving all four extremities  Skin: No lesions         Lab results: Basic Metabolic Panel: Recent Labs  Lab 03/04/18 2228 03/05/18 0531 03/06/18 0604  NA 124* 128* 133*  K 3.9 4.0 4.0  CL 90* 96* 102  CO2 22 23 27   GLUCOSE 129* 103* 100*  BUN 30* 28* 31*  CREATININE 1.76* 1.69* 1.84*  CALCIUM 9.4 8.8* 8.5*    Liver Function Tests: Recent Labs  Lab 03/04/18 2228  AST 35  ALT 24  ALKPHOS 93  BILITOT 0.9  PROT 7.6  ALBUMIN 4.5   Recent Labs  Lab 03/04/18 2228  LIPASE 62*   No results for input(s): AMMONIA in the last 168 hours.  CBC: Recent Labs  Lab 03/04/18 2228  WBC 9.1  HGB 12.3  HCT 34.9*  MCV 88.8  PLT 232    Cardiac Enzymes: No results for input(s): CKTOTAL, CKMB, CKMBINDEX, TROPONINI in the last 168 hours.  BNP: Invalid input(s): POCBNP  CBG: No results for input(s): GLUCAP in the last 168 hours.  Microbiology: Results for orders placed or performed during the hospital encounter of 03/05/18  MRSA PCR Screening     Status: None   Collection Time: 03/05/18  5:05 AM  Result Value Ref Range Status   MRSA by PCR NEGATIVE NEGATIVE Final    Comment:        The GeneXpert MRSA Assay (FDA approved for NASAL specimens only), is one component of a comprehensive MRSA colonization surveillance program. It is not intended to diagnose MRSA infection nor to guide or monitor treatment for MRSA infections. Performed at [facility], [address] [place], [place] [address]     Coagulation Studies: No results for input(s): LABPROT, INR in the last 72 hours.  Urinalysis: Recent Labs    03/05/18 0355  COLORURINE STRAW*  LABSPEC 1.005  PHURINE 7.0  GLUCOSEU NEGATIVE  HGBUR NEGATIVE  BILIRUBINUR NEGATIVE  KETONESUR NEGATIVE  PROTEINUR NEGATIVE  NITRITE NEGATIVE  LEUKOCYTESUR NEGATIVE      Imaging:  No results found.   Assessment & Plan: Tina Curtis is a 84 y.o. white female with hypertension, depression, gout, hyperlipidemia, who was admitted to [facility] on 03/05/2018 for Dehydration [E86.0] Hyponatremia [E87.1] Pancreatic cyst [K86.2] Nausea and vomiting, intractability of vomiting not specified, unspecified vomiting type [R11.2]  1. Acute renal failure on chronic kidney disease stage IV: bland urine. Baseline creatinine of 2, GFR of 24 on 02/08/18 Acute renal failure from poor PO intake.  Chronic kidney disease secondary to hypertension - NS at 99mL/hr  2. Hyponatremia: on hydrochlorothiazide -  discontinue hydrochlorothiazide. Do not recommend restarting.   3. Hypertension: elevated. Due to nausea - continue losartan and hydralazine.   LOS: 2 Mateja Dier 10/28/20194:41 PM

## 2018-03-07 NOTE — Anesthesia Post-op Follow-up Note (Signed)
Anesthesia QCDR form completed.        

## 2018-03-07 NOTE — Progress Notes (Signed)
Pressure continues to be high with systolic blood pressure 999.  Patient dizzy and has blurred vision.  Ordered IV hydralazine as needed.  Likely discharge in the morning.

## 2018-03-07 NOTE — Progress Notes (Signed)
[place] [facility] [place] NAME: Tina Curtis    MR#:  [id number]  DATE OF BIRTH:  [birth year]  SUBJECTIVE:  CHIEF COMPLAINT:   Chief Complaint  Patient presents with  . Emesis  Patient without complaint, gastroenterology input appreciated, for endoscopy later today  REVIEW OF SYSTEMS:  CONSTITUTIONAL: No fever, fatigue or weakness.  EYES: No blurred or double vision.  EARS, NOSE, AND THROAT: No tinnitus or ear pain.  RESPIRATORY: No cough, shortness of breath, wheezing or hemoptysis.  CARDIOVASCULAR: No chest pain, orthopnea, edema.  GASTROINTESTINAL: No nausea, vomiting, diarrhea or abdominal pain.  GENITOURINARY: No dysuria, hematuria.  ENDOCRINE: No polyuria, nocturia,  HEMATOLOGY: No anemia, easy bruising or bleeding SKIN: No rash or lesion. MUSCULOSKELETAL: No joint pain or arthritis.   NEUROLOGIC: No tingling, numbness, weakness.  PSYCHIATRY: No anxiety or depression.   ROS  DRUG ALLERGIES:   Allergies  Allergen Reactions  . Penicillins Anaphylaxis    VITALS:  Blood pressure (!) 159/61, pulse (!) 58, temperature 97.7 F (36.5 C), temperature source Oral, resp. rate 20, height 5\' 6"  (1.676 m), weight 62.5 kg, SpO2 98 %.  PHYSICAL EXAMINATION:  GENERAL:  84 y.o.-year-old patient lying in the bed with no acute distress.  EYES: Pupils equal, round, reactive to light and accommodation. No scleral icterus. Extraocular muscles intact.  HEENT: Head atraumatic, normocephalic. Oropharynx and nasopharynx clear.  NECK:  Supple, no jugular venous distention. No thyroid enlargement, no tenderness.  LUNGS: Normal breath sounds bilaterally, no wheezing, rales,rhonchi or crepitation. No use of accessory muscles of respiration.  CARDIOVASCULAR: S1, S2 normal. No murmurs, rubs, or gallops.  ABDOMEN: Soft, nontender, nondistended. Bowel sounds present. No organomegaly or mass.  EXTREMITIES: No pedal edema, cyanosis, or clubbing.  NEUROLOGIC:  Cranial nerves II through XII are intact. Muscle strength 5/5 in all extremities. Sensation intact. Gait not checked.  PSYCHIATRIC: The patient is alert and oriented x 3.  SKIN: No obvious rash, lesion, or ulcer.   Physical Exam LABORATORY PANEL:   CBC Recent Labs  Lab 03/04/18 2228  WBC 9.1  HGB 12.3  HCT 34.9*  PLT 232   ------------------------------------------------------------------------------------------------------------------  Chemistries  Recent Labs  Lab 03/04/18 2228  03/06/18 0604  NA 124*   < > 133*  K 3.9   < > 4.0  CL 90*   < > 102  CO2 22   < > 27  GLUCOSE 129*   < > 100*  BUN 30*   < > 31*  CREATININE 1.76*   < > 1.84*  CALCIUM 9.4   < > 8.5*  AST 35  --   --   ALT 24  --   --   ALKPHOS 93  --   --   BILITOT 0.9  --   --    < > = values in this interval not displayed.   ------------------------------------------------------------------------------------------------------------------  Cardiac Enzymes No results for input(s): TROPONINI in the last 168 hours. ------------------------------------------------------------------------------------------------------------------  RADIOLOGY:  No results found.  ASSESSMENT AND PLAN:  *Acute constipation Gastroenterology input greatly appreciated, for EGD later today Continue bowel regiment  *Acute hyponatremia/hypochloremia Improved Treated with IV fluids while in-house and salt tabs  *Acute abnormal CT abdomen findings Noted for multiple pancreatic cysts, 3.3 cm AAA Would recommend conservative therapy given advanced age Gastroenterology following as stated above  Disposition pending gastroenterology recommendations  All the records are reviewed and case discussed with Care Management/Social Workerr. Management plans discussed with the patient, family and  they are in agreement.  CODE STATUS: DNR  TOTAL TIME TAKING CARE OF THIS PATIENT: 35 minutes.     POSSIBLE D/C IN 1-2 DAYS, DEPENDING  ON CLINICAL CONDITION.   Avel Peace Brooklynn Brandenburg M.D on 03/07/2018   Between 7am to 6pm - Pager - [phone]  After 6pm go to [email or web address] - password [email or web address] [facility]  Office  [phone]  CC: Primary care physician; Derinda Late, MD  Note: This dictation was prepared with Dragon dictation along with smaller phrase technology. Any transcriptional errors that result from this process are unintentional.

## 2018-03-07 NOTE — Progress Notes (Signed)
Initial Nutrition Assessment  DOCUMENTATION CODES:   Not applicable  INTERVENTION:  Monitor for diet advancement Malnutrition not identified at 10/10 [facility] admission, NFPE needed at follow up  Recs upon advancement to diet to ensure optimal PO intake: Ensure Enlive po BID, each supplement provides 350 kcal and 20 grams of protein Magic cup TID with meals, each supplement provides 290 kcal and 9 grams of protein MVI   NUTRITION DIAGNOSIS:   Predicted suboptimal nutrient intake related to nausea, chronic illness(progressing CKD3) as evidenced by NPO status(for procedure).   GOAL:   Patient will meet greater than or equal to 90% of their needs   MONITOR:   Diet advancement  REASON FOR ASSESSMENT:   Consult Assessment of nutrition requirement/status  ASSESSMENT:  [place] resident admitted 10/26 w/ intractable nausea, intermittent diarrhea, uremia secondary to progressing CKD3. EGD scheduled for today for gastric and duodenal biopsies s/p CT revealing cystic lesions involving pancreas. Recent [facility] admission 10/10-10/15 for UTI and renal failure. PMH: Depression, Recent loss of husband, Gout, HTN, BLE Neuropathy, Hysterectomy, Appendectomy   RD attempted to see pt 2x today; MD w/ pt at 1st attempt, pt out of room at 2nd. Unable to provide assessment at this time.   Medications: Gabapentin, Protonix, Miralax,  D5% 13ml/hr providing 306 kcals   Labs: Na 133 - replacing w/ D5, Na 0.9% @ 75/ml BUN 31 (H) Cr 1.84 (H)  NUTRITION - FOCUSED PHYSICAL EXAM: Unable to complete Malnutrition not identified at 10/10 hospital admission Diet Order:   Diet Order            Diet NPO time specified Except for: Ice Chips, Sips with Meds  Diet effective midnight        Diet - low sodium heart healthy              EDUCATION NEEDS:   No education needs have been identified at this time  Skin:  Skin Assessment: Reviewed RN Assessment(abrasion; scabbed rt buttocks)  Last  BM:  reviewed 10/27 RN note(unknown; constipation shown on CT)  Height:   Ht Readings from Last 1 Encounters:  03/07/18 5\' 6"  (1.676 m)    Weight:   Wt Readings from Last 1 Encounters:  03/07/18 62.5 kg    Ideal Body Weight:  59 kg  BMI:  Body mass index is 22.24 kg/m.  Estimated Nutritional Needs:   Kcal:  1542-1800  Protein:  68-82  Fluid:  1.5-1.8L    Lajuan Lines, RD, LDN  After Hours/Weekend Pager: [phone]

## 2018-03-07 NOTE — Transfer of Care (Signed)
Immediate Anesthesia Transfer of Care Note  Patient: Tina Curtis  Procedure(s) Performed: ESOPHAGOGASTRODUODENOSCOPY (EGD) (N/A )  Patient Location: PACU and Endoscopy Unit  Anesthesia Type:General  Level of Consciousness: awake, oriented, drowsy and patient cooperative  Airway & Oxygen Therapy: Patient Spontanous Breathing and Patient connected to nasal cannula oxygen  Post-op Assessment: Report given to RN, Post -op Vital signs reviewed and stable and Patient moving all extremities  Post vital signs: Reviewed and stable  Last Vitals:  Vitals Value Taken Time  BP    Temp    Pulse    Resp    SpO2      Last Pain:  Vitals:   03/07/18 1428  TempSrc: Tympanic  PainSc: 0-No pain         Complications: No apparent anesthesia complications

## 2018-03-07 NOTE — Op Note (Signed)
[facility] Gastroenterology Patient Name: Tina Curtis Procedure Date: 03/07/2018 12:21 PM MRN: [id number] Account #: [id number] Date of Birth: December 21, 1926 Admit Type: Inpatient Age: 84 Room: [facility] ENDO ROOM 3 Gender: Female Note Status: Finalized Procedure:            Upper GI endoscopy Indications:          Nausea, intractable , bloating, loose stools Providers:            Lin Landsman MD, MD Referring MD:         No Local Md, MD (Referring MD) Medicines:            Monitored Anesthesia Care Complications:        No immediate complications. Estimated blood loss:                        Minimal. Procedure:            Pre-Anesthesia Assessment:                       - Prior to the procedure, a History and Physical was                        performed, and patient medications and allergies were                        reviewed. The patient is competent. The risks and                        benefits of the procedure and the sedation options and                        risks were discussed with the patient. All questions                        were answered and informed consent was obtained.                        Patient identification and proposed procedure were                        verified by the physician, the nurse, the                        anesthesiologist, the anesthetist and the technician in                        the pre-procedure area in the procedure room in the                        endoscopy suite. Mental Status Examination: alert and                        oriented. Airway Examination: normal oropharyngeal                        airway and neck mobility. Respiratory Examination:                        clear to auscultation. CV Examination: normal.  Prophylactic Antibiotics: The patient does not require                        prophylactic antibiotics. Prior Anticoagulants: The                        patient has taken no  previous anticoagulant or                        antiplatelet agents. ASA Grade Assessment: III - A                        patient with severe systemic disease. After reviewing                        the risks and benefits, the patient was deemed in                        satisfactory condition to undergo the procedure. The                        anesthesia plan was to use monitored anesthesia care                        (MAC). Immediately prior to administration of                        medications, the patient was re-assessed for adequacy                        to receive sedatives. The heart rate, respiratory rate,                        oxygen saturations, blood pressure, adequacy of                        pulmonary ventilation, and response to care were                        monitored throughout the procedure. The physical status                        of the patient was re-assessed after the procedure.                       After obtaining informed consent, the endoscope was                        passed under direct vision. Throughout the procedure,                        the patient's blood pressure, pulse, and oxygen                        saturations were monitored continuously. The Endoscope                        was introduced through the mouth, and advanced to the  second part of duodenum. The upper GI endoscopy was                        accomplished without difficulty. The patient tolerated                        the procedure well. Findings:      The duodenal bulb and second portion of the duodenum were normal.       Biopsies were taken with a cold forceps for histology.      Localized moderately erythematous mucosa without bleeding was found on       the greater curvature of the gastric body. Biopsies were taken with a       cold forceps for Helicobacter pylori testing.      One non-bleeding superficial gastric ulcer with a clean ulcer base        (Forrest Class III) was found on the greater curvature of the gastric       body. The lesion was 5 mm in largest dimension.      The cardia, gastric fundus, incisura and gastric antrum were normal.       Biopsies were taken with a cold forceps for Helicobacter pylori testing.      The cardia and gastric fundus were normal on retroflexion.      The gastroesophageal junction and examined esophagus were normal. Impression:           - Normal duodenal bulb and second portion of the                        duodenum. Biopsied.                       - Erythematous mucosa in the greater curvature of the                        gastric body. Biopsied.                       - Non-bleeding gastric ulcer with a clean ulcer base                        (Forrest Class III).                       - Normal cardia, gastric fundus, incisura and antrum.                        Biopsied.                       - Normal gastroesophageal junction and esophagus. Recommendation:       - Return patient to hospital ward for ongoing care.                       - Resume regular diet today.                       - Continue present medications.                       - Await pathology results.                       -  Start Protonix (pantoprazole) 40 mg PO BID for 3                        months.                       - Return to GI clinic in 2 weeks. Procedure Code(s):    --- Professional ---                       [id number], Esophagogastroduodenoscopy, flexible, transoral;                        with biopsy, single or multiple Diagnosis Code(s):    --- Professional ---                       K31.89, Other diseases of stomach and duodenum                       K25.9, Gastric ulcer, unspecified as acute or chronic,                        without hemorrhage or perforation                       R11.0, Nausea CPT copyright 2018 [employer]. All rights reserved. The codes documented in this report are preliminary  and upon coder review may  be revised to meet current compliance requirements. Dr. Ulyess Mort Lin Landsman MD, MD 03/07/2018 3:12:35 PM This report has been signed electronically. Number of Addenda: 0 Note Initiated On: 03/07/2018 12:21 PM Total Procedure Duration: 0 hours 7 minutes 2 seconds       [facility]

## 2018-03-08 ENCOUNTER — Encounter: Payer: Self-pay | Admitting: Gastroenterology

## 2018-03-08 LAB — RENAL FUNCTION PANEL
ANION GAP: 4 — AB (ref 5–15)
Albumin: 3.4 g/dL — ABNORMAL LOW (ref 3.5–5.0)
BUN: 25 mg/dL — ABNORMAL HIGH (ref 8–23)
CALCIUM: 8.5 mg/dL — AB (ref 8.9–10.3)
CO2: 25 mmol/L (ref 22–32)
CREATININE: 1.75 mg/dL — AB (ref 0.44–1.00)
Chloride: 107 mmol/L (ref 98–111)
GFR calc non Af Amer: 25 mL/min — ABNORMAL LOW (ref >60)
GFR, EST AFRICAN AMERICAN: 29 mL/min — AB (ref >60)
Glucose, Bld: 85 mg/dL (ref 70–99)
Phosphorus: 2.5 mg/dL (ref 2.5–4.6)
Potassium: 4.1 mmol/L (ref 3.5–5.1)
Sodium: 136 mmol/L (ref 135–145)

## 2018-03-08 MED ORDER — MECLIZINE HCL 25 MG PO CHEW: 25.0000 mg | CHEWABLE_TABLET | Freq: Three times a day (TID) | ORAL | 0 refills | Status: DC | PRN

## 2018-03-08 MED ORDER — SODIUM CHLORIDE 0.9% FLUSH
3.0000 mL | Freq: Two times a day (BID) | INTRAVENOUS | Status: DC
  Administered 2018-03-08: 3 mL via INTRAVENOUS

## 2018-03-08 MED ORDER — LOSARTAN POTASSIUM 100 MG PO TABS: 100.0000 mg | ORAL_TABLET | Freq: Every day | ORAL | 0 refills | Status: DC

## 2018-03-08 MED ORDER — DOCUSATE SODIUM 100 MG PO CAPS: 100.0000 mg | ORAL_CAPSULE | Freq: Two times a day (BID) | ORAL | 0 refills | Status: AC

## 2018-03-08 MED ORDER — PROMETHAZINE HCL 6.25 MG/5ML PO SYRP: 6.2500 mg | ORAL_SOLUTION | Freq: Four times a day (QID) | ORAL | 0 refills | Status: DC | PRN

## 2018-03-08 MED ORDER — OLANZAPINE 2.5 MG PO TABS: 2.5000 mg | ORAL_TABLET | Freq: Every day | ORAL | 0 refills | Status: DC

## 2018-03-08 MED ORDER — POLYETHYLENE GLYCOL 3350 17 G PO PACK: 17.0000 g | PACK | Freq: Every day | ORAL | 0 refills | Status: DC | PRN

## 2018-03-08 NOTE — Progress Notes (Signed)
Discharged to home with her niece.  Went over discharge instructions with her niece who had many good questions. Home health PT, RN, and aide with [facility].

## 2018-03-08 NOTE — Anesthesia Postprocedure Evaluation (Signed)
Anesthesia Post Note  Patient: Tina Curtis  Procedure(s) Performed: ESOPHAGOGASTRODUODENOSCOPY (EGD) (N/A )  Patient location during evaluation: Endoscopy Anesthesia Type: General Level of consciousness: awake and alert Pain management: pain level controlled Vital Signs Assessment: post-procedure vital signs reviewed and stable Respiratory status: spontaneous breathing, nonlabored ventilation, respiratory function stable and patient connected to nasal cannula oxygen Cardiovascular status: blood pressure returned to baseline and stable Postop Assessment: no apparent nausea or vomiting Anesthetic complications: no     Last Vitals:  Vitals:   03/07/18 1949 03/08/18 0510  BP: (!) 162/65 (!) 123/44  Pulse: 72 67  Resp: 18 18  Temp: 37.3 C 36.7 C  SpO2: 95% 96%    Last Pain:  Vitals:   03/08/18 0510  TempSrc: Oral  PainSc:                  Precious Haws Irie Fiorello

## 2018-03-08 NOTE — Progress Notes (Signed)
[facility]  ROUNDING NOTE   Subjective:   Niece at bedside.   Nausea has improved  Creatinine 1.75 (1.84)  Ulcer found on EGD  Objective:  Vital signs in last 24 hours:  Temp:  [97.1 F (36.2 C)-99.2 F (37.3 C)] 98.3 F (36.8 C) (10/29 0804) Pulse Rate:  [59-72] 67 (10/29 0804) Resp:  [14-18] 16 (10/29 0833) BP: (123-191)/(44-71) 133/61 (10/29 0804) SpO2:  [95 %-100 %] 96 % (10/29 0804) Weight:  [62.5 kg] 62.5 kg (10/28 1428)  Weight change:  Filed Weights   03/04/18 2212 03/05/18 0449 03/07/18 1428  Weight: 64.9 kg 62.5 kg 62.5 kg    Intake/Output: I/O last 3 completed shifts: In: 300 [I.V.:300] Out: 1700 [Urine:1700]   Intake/Output this shift:  Total I/O In: -  Out: 400 [Urine:400]  Physical Exam: General: NAD,   Head: Normocephalic, atraumatic. Moist oral mucosal membranes  Eyes: Anicteric, PERRL  Neck: Supple, trachea midline  Lungs:  Clear to auscultation  Heart: Regular rate and rhythm  Abdomen:  Mild tenderness  Extremities: no peripheral edema.  Neurologic: Nonfocal, moving all four extremities  Skin: No lesions        Basic Metabolic Panel: Recent Labs  Lab 03/04/18 2228 03/05/18 0531 03/06/18 0604 03/08/18 0635  NA 124* 128* 133* 136  K 3.9 4.0 4.0 4.1  CL 90* 96* 102 107  CO2 22 23 27 25   GLUCOSE 129* 103* 100* 85  BUN 30* 28* 31* 25*  CREATININE 1.76* 1.69* 1.84* 1.75*  CALCIUM 9.4 8.8* 8.5* 8.5*  PHOS  --   --   --  2.5    Liver Function Tests: Recent Labs  Lab 03/04/18 2228 03/08/18 0635  AST 35  --   ALT 24  --   ALKPHOS 93  --   BILITOT 0.9  --   PROT 7.6  --   ALBUMIN 4.5 3.4*   Recent Labs  Lab 03/04/18 2228  LIPASE 62*   No results for input(s): AMMONIA in the last 168 hours.  CBC: Recent Labs  Lab 03/04/18 2228  WBC 9.1  HGB 12.3  HCT 34.9*  MCV 88.8  PLT 232    Cardiac Enzymes: No results for input(s): CKTOTAL, CKMB, CKMBINDEX, TROPONINI in the last 168 hours.  BNP: Invalid  input(s): POCBNP  CBG: No results for input(s): GLUCAP in the last 168 hours.  Microbiology: Results for orders placed or performed during the hospital encounter of 03/05/18  MRSA PCR Screening     Status: None   Collection Time: 03/05/18  5:05 AM  Result Value Ref Range Status   MRSA by PCR NEGATIVE NEGATIVE Final    Comment:        The GeneXpert MRSA Assay (FDA approved for NASAL specimens only), is one component of a comprehensive MRSA colonization surveillance program. It is not intended to diagnose MRSA infection nor to guide or monitor treatment for MRSA infections. Performed at [facility], [address] [place], [place] [address]     Coagulation Studies: No results for input(s): LABPROT, INR in the last 72 hours.  Urinalysis: No results for input(s): COLORURINE, LABSPEC, PHURINE, GLUCOSEU, HGBUR, BILIRUBINUR, KETONESUR, PROTEINUR, UROBILINOGEN, NITRITE, LEUKOCYTESUR in the last 72 hours.  Invalid input(s): APPERANCEUR    Imaging: US Renal  Result Date: 03/07/2018 CLINICAL DATA:  84 year old female with acute kidney insufficiency and chronic kidney disease stage 4. EXAM: RENAL / URINARY TRACT ULTRASOUND COMPLETE COMPARISON:  04/05/2018 CT. FINDINGS: Right Kidney: Length: 8.3 cm. Echogenicity within  normal limits. No mass or hydronephrosis visualized. Left Kidney: Length: 7.4 cm. Renal parenchymal thinning. No mass or hydronephrosis. Bladder: Appears normal for degree of bladder distention. IMPRESSION: 1. No hydronephrosis. 2. Atrophic left kidney with renal parenchymal thinning. Electronically Signed   By: Genia Del M.D.   On: 03/07/2018 18:04     Medications:    . allopurinol  50 mg Oral Daily  . aspirin EC  81 mg Oral Daily  . feeding supplement (GLUCERNA SHAKE)  237 mL Oral TID BM  . gabapentin  100 mg Oral BID  . heparin  5,000 Units Subcutaneous Q8H  . hydrALAZINE  50 mg Oral BID  . losartan  100 mg Oral Daily  . metoprolol succinate   37.5 mg Oral QHS  . OLANZapine  2.5 mg Oral QHS  . pantoprazole  40 mg Oral BID AC  . polyethylene glycol  17 g Oral Daily  . simvastatin  20 mg Oral q1800  . sodium chloride flush  3 mL Intravenous Q12H  . traZODone  25 mg Oral QHS   acetaminophen **OR** acetaminophen, bisacodyl, colchicine, hydrALAZINE, HYDROmorphone (DILAUDID) injection, prochlorperazine, senna-docusate  Assessment/ Plan:  Ms. DELORES EDELSTEIN is a 84 y.o. white female with Ms. ETHYLE TIEDT is a 84 y.o. white female with hypertension, depression, gout, hyperlipidemia, who was admitted to [facility] on 03/05/2018 for acute renal failure and hyponatremia  1. Acute renal failure on chronic kidney disease stage IV: bland urine. Baseline creatinine of 2, GFR of 24 on 02/08/18 Acute renal failure from poor PO intake.  Chronic kidney disease secondary to hypertension and renal vascular disease - Renal dopplers as outpatient.  - Encourage PO intake - Restarted on lisinopril.   2. Hyponatremia: on hydrochlorothiazide - discontinue hydrochlorothiazide. Do not recommend restarting.   3. Hypertension:   - continue losartan and hydralazine.     LOS: 3 Latwan Luchsinger 10/29/201911:56 AM

## 2018-03-08 NOTE — Discharge Summary (Signed)
[facility] [place] NAME: Tina Curtis    MR#:  [id number]  DATE OF BIRTH:  10-15-1926  DATE OF ADMISSION:  03/05/2018 ADMITTING PHYSICIAN: Arta Silence, MD  DATE OF DISCHARGE: No discharge date for patient encounter.  PRIMARY CARE PHYSICIAN: Derinda Late, MD    ADMISSION DIAGNOSIS:  Dehydration [E86.0] Hyponatremia [E87.1] Pancreatic cyst [K86.2] Nausea and vomiting, intractability of vomiting not specified, unspecified vomiting type [R11.2]  DISCHARGE DIAGNOSIS:  Active Problems:   Nausea & vomiting   SECONDARY DIAGNOSIS:   Past Medical History:  Diagnosis Date  . Arthritis    Gout  . Hypercholesteremia   . Hypertension   . Neuropathy    feet and lower legs  . Seasonal allergies   . Skin cancer    face    HOSPITAL COURSE:  *Acute constipation Resolved Seen by gastroenterology/Dr. Vanga-Wellbutrin/citalopram were discontinued in favor of Zyprexa 2.5 mg at bedtime, Protonix 40 mg twice a day, and recommended MRI of the pancreas as an outpatient for further work-up of pancreatic cysts EGD: - Normal duodenal bulb and second portion of the duodenum. Biopsied. - Erythematous mucosa in the greater curvature of the gastric body. Biopsied. - Non-bleeding gastric ulcer with a clean ulcer base (Forrest Class III). - Normal cardia, gastric fundus, incisura and antrum. Biopsied. - Normal gastroesophageal junction and esophagus. Impression: - Return patient to hospital ward for ongoing care. - Resume regular diet today. - Continue present medications. - Await pathology results. - Start Protonix (pantoprazole) 40 mg PO BID for 3 months. - Return to GI clinic in 2 weeks.  *Acute hyponatremia/hypochloremia Improved Treated with IV fluids while in-house, salt tabs, hydrochlorothiazide discontinued  *Acute abnormal CT abdomen findings Noted for multiple pancreatic cysts, 3.3 cm AAA Would recommend conservative  therapy given advanced age Gastroenterology did see patient while in house-plan of care per above Follow-up with gastroenterology status post discharge for reevaluation  *Chronic kidney disease stage IV At baseline-creatinine 2 at baseline, currently 1.7 Hydrochlorothiazide discontinued indefinitely  *Chronic benign essential hypertension Controlled on current regiment  *Acute dizziness Meclizine as needed   DISCHARGE CONDITIONS:   stable  CONSULTS OBTAINED:  Treatment Team:  Arta Silence, MD Lin Landsman, MD Anthonette Legato, MD  DRUG ALLERGIES:   Allergies  Allergen Reactions  . Penicillins Anaphylaxis    DISCHARGE MEDICATIONS:   Allergies as of 03/08/2018      Reactions   Penicillins Anaphylaxis      Medication List    STOP taking these medications   buPROPion 150 MG 12 hr tablet Commonly known as:  WELLBUTRIN SR   ciprofloxacin 500 MG tablet Commonly known as:  CIPRO   citalopram 20 MG tablet Commonly known as:  CELEXA   losartan-hydrochlorothiazide 100-25 MG tablet Commonly known as:  HYZAAR   mupirocin ointment 2 % Commonly known as:  BACTROBAN   risperiDONE 0.5 MG disintegrating tablet Commonly known as:  RISPERDAL M-TABS     TAKE these medications   acetaminophen 500 MG tablet Commonly known as:  TYLENOL Take 500 mg by mouth every 6 (six) hours as needed. Notes to patient:  NONE GIVEN TODAY   ALIGN PO Take by mouth. AM Notes to patient:  NONE GIVEN TODAY   allopurinol 300 MG tablet Commonly known as:  ZYLOPRIM Take 300 mg by mouth daily. AM   aspirin EC 81 MG tablet Take 81 mg by mouth daily.   colchicine 0.6 MG tablet Take 0.6 mg by mouth  daily as needed. Notes to patient:  NONE GIVEN TODAY   docusate sodium 100 MG capsule Commonly known as:  COLACE Take 1 capsule (100 mg total) by mouth 2 (two) times daily.   feeding supplement (GLUCERNA SHAKE) Liqd Take 237 mLs by mouth 3 (three) times daily between  meals. Notes to patient:  TAKE BETWEEN MEALS   gabapentin 300 MG capsule Commonly known as:  NEURONTIN Take 300 mg by mouth at bedtime.   hydrALAZINE 50 MG tablet Commonly known as:  APRESOLINE Take 1 tablet (50 mg total) by mouth 2 (two) times daily.   ICAPS PO Take by mouth daily. Notes to patient:  NONE GIVEN TODAY   loratadine 10 MG tablet Commonly known as:  CLARITIN Take 10 mg by mouth daily as needed for allergies. AM Notes to patient:  TAKE AT BEDTIME   losartan 100 MG tablet Commonly known as:  COZAAR Take 1 tablet (100 mg total) by mouth daily. Start taking on:  03/09/2018   Meclizine HCl 25 MG Chew Chew 1 tablet (25 mg total) by mouth 3 (three) times daily as needed. Notes to patient:  FOR NAUSEA AND VOMITING.     metoprolol succinate 25 MG 24 hr tablet Commonly known as:  TOPROL-XL Take 37.5 mg by mouth at bedtime.   OLANZapine 2.5 MG tablet Commonly known as:  ZYPREXA Take 1 tablet (2.5 mg total) by mouth at bedtime.   ondansetron 4 MG disintegrating tablet Commonly known as:  ZOFRAN-ODT Take 4 mg by mouth every 8 (eight) hours as needed for nausea or vomiting.   pantoprazole 40 MG tablet Commonly known as:  PROTONIX Take 1 tablet (40 mg total) by mouth 2 (two) times daily before a meal.   polyethylene glycol packet Commonly known as:  MIRALAX / GLYCOLAX Take 17 g by mouth daily as needed.   promethazine 6.25 MG/5ML syrup Commonly known as:  PHENERGAN Take 5 mLs (6.25 mg total) by mouth every 6 (six) hours as needed for refractory nausea / vomiting. Notes to patient:  NONE GIVEN TODAY   simvastatin 20 MG tablet Commonly known as:  ZOCOR Take 20 mg by mouth daily. PM   traZODone 50 MG tablet Commonly known as:  DESYREL Take 0.5 tablets (25 mg total) by mouth at bedtime.   VITAMIN D PO Take by mouth. Notes to patient:  NONE GIVEN TODAY        DISCHARGE INSTRUCTIONS:  If you experience worsening of your admission symptoms, develop  shortness of breath, life threatening emergency, suicidal or homicidal thoughts you must seek medical attention immediately by calling 911 or calling your MD immediately  if symptoms less severe.  You Must read complete instructions/literature along with all the possible adverse reactions/side effects for all the Medicines you take and that have been prescribed to you. Take any new Medicines after you have completely understood and accept all the possible adverse reactions/side effects.   Please note  You were cared for by a hospitalist during your hospital stay. If you have any questions about your discharge medications or the care you received while you were in the hospital after you are discharged, you can call the unit and asked to speak with the hospitalist on call if the hospitalist that took care of you is not available. Once you are discharged, your primary care physician will handle any further medical issues. Please note that NO REFILLS for any discharge medications will be authorized once you are discharged, as it is imperative that  you return to your primary care physician (or establish a relationship with a primary care physician if you do not have one) for your aftercare needs so that they can reassess your need for medications and monitor your lab values.    Today   CHIEF COMPLAINT:   Chief Complaint  Patient presents with  . Emesis    HISTORY OF PRESENT ILLNESS:  84 y.o. female with a known history of HTN, HLD, gout, RLS p/w N/V/AP. AAOx3, good historian. She is hard of hearing, but she can hear satisfactorily if you stand close to her. She endorses chronic intermittent lightheadedness and nausea since July. She tells me she was recently hospitalized from 10/10 to 10/15 for a urinary tract infection and renal failure. She states that when she was discharged on 10/15, she still had some mild nausea, but felt as though she was generally improving. She states that for the past 3d,  however, she has been having worse nausea than usual. She endorses PO intolerance and N/V w/ attempted PO intake. She endorses poor intake and dehydration. She endorses diffuse abdominal pain/discomfort and bloating/gaseous distension. She states her symptoms have been especially severe x1d, prompting hospitalization. She endorses a Hx of acute pancreatitis in 1962, but denies having had pancreatitis since then. Lipase 62. CT A/P report reads, "There are multiple low attenuating/cystic lesions involving the pancreas which are not characterized on this noncontrast CT but may represent side branch IPMNs." There is no mention of pancreatic edema, peripancreatic fluid collection, necrosis or abscess. These findings are likely chronic, and I am not entirely sure her present symptomatology is related to this imaging finding. VITAL SIGNS:  Blood pressure 133/61, pulse 67, temperature 98.3 F (36.8 C), temperature source Oral, resp. rate 16, height 5\' 6"  (1.676 m), weight 62.5 kg, SpO2 96 %.  I/O:    Intake/Output Summary (Last 24 hours) at 03/08/2018 0957 Last data filed at 03/08/2018 0816 Gross per 24 hour  Intake 300 ml  Output 500 ml  Net -200 ml    PHYSICAL EXAMINATION:  GENERAL:  84 y.o.-year-old patient lying in the bed with no acute distress.  EYES: Pupils equal, round, reactive to light and accommodation. No scleral icterus. Extraocular muscles intact.  HEENT: Head atraumatic, normocephalic. Oropharynx and nasopharynx clear.  NECK:  Supple, no jugular venous distention. No thyroid enlargement, no tenderness.  LUNGS: Normal breath sounds bilaterally, no wheezing, rales,rhonchi or crepitation. No use of accessory muscles of respiration.  CARDIOVASCULAR: S1, S2 normal. No murmurs, rubs, or gallops.  ABDOMEN: Soft, non-tender, non-distended. Bowel sounds present. No organomegaly or mass.  EXTREMITIES: No pedal edema, cyanosis, or clubbing.  NEUROLOGIC: Cranial nerves II through XII are intact.  Muscle strength 5/5 in all extremities. Sensation intact. Gait not checked.  PSYCHIATRIC: The patient is alert and oriented x 3.  SKIN: No obvious rash, lesion, or ulcer.   DATA REVIEW:   CBC Recent Labs  Lab 03/04/18 2228  WBC 9.1  HGB 12.3  HCT 34.9*  PLT 232    Chemistries  Recent Labs  Lab 03/04/18 2228  03/08/18 0635  NA 124*   < > 136  K 3.9   < > 4.1  CL 90*   < > 107  CO2 22   < > 25  GLUCOSE 129*   < > 85  BUN 30*   < > 25*  CREATININE 1.76*   < > 1.75*  CALCIUM 9.4   < > 8.5*  AST 35  --   --  ALT 24  --   --   ALKPHOS 93  --   --   BILITOT 0.9  --   --    < > = values in this interval not displayed.    Cardiac Enzymes No results for input(s): TROPONINI in the last 168 hours.  Microbiology Results  Results for orders placed or performed during the hospital encounter of 03/05/18  MRSA PCR Screening     Status: None   Collection Time: 03/05/18  5:05 AM  Result Value Ref Range Status   MRSA by PCR NEGATIVE NEGATIVE Final    Comment:        The GeneXpert MRSA Assay (FDA approved for NASAL specimens only), is one component of a comprehensive MRSA colonization surveillance program. It is not intended to diagnose MRSA infection nor to guide or monitor treatment for MRSA infections. Performed at [facility], [address] [place], [place] [address]     RADIOLOGY:  US Renal  Result Date: 03/07/2018 CLINICAL DATA:  84 year old female with acute kidney insufficiency and chronic kidney disease stage 4. EXAM: RENAL / URINARY TRACT ULTRASOUND COMPLETE COMPARISON:  04/05/2018 CT. FINDINGS: Right Kidney: Length: 8.3 cm. Echogenicity within normal limits. No mass or hydronephrosis visualized. Left Kidney: Length: 7.4 cm. Renal parenchymal thinning. No mass or hydronephrosis. Bladder: Appears normal for degree of bladder distention. IMPRESSION: 1. No hydronephrosis. 2. Atrophic left kidney with renal parenchymal thinning. Electronically Signed    By: Genia Del M.D.   On: 03/07/2018 18:04    EKG:   Orders placed or performed during the hospital encounter of 02/17/18  . ED EKG  . ED EKG  . EKG 12-Lead  . EKG 12-Lead  . EKG 12-Lead  . EKG 12-Lead      Management plans discussed with the patient, family and they are in agreement.  CODE STATUS:     Code Status Orders  (From admission, onward)         Start     Ordered   03/05/18 0355  Do not attempt resuscitation (DNR)  Continuous    Question Answer Comment  In the event of cardiac or respiratory ARREST Do not call a "code blue"   In the event of cardiac or respiratory ARREST Do not perform Intubation, CPR, defibrillation or ACLS   In the event of cardiac or respiratory ARREST Use medication by any route, position, wound care, and other measures to relive pain and suffering. May use oxygen, suction and manual treatment of airway obstruction as needed for comfort.   Comments RN MAY PRONOUNCE      03/05/18 0354        Code Status History    Date Active Date Inactive Code Status Order ID Comments User Context   02/17/2018 1857 02/22/2018 1755 DNR [id number]  Nicholes Mango, MD ED    Advance Directive Documentation     Most Recent Value  Type of Advance Directive  Out of facility DNR (pink MOST or yellow form)  Pre-existing out of facility DNR order (yellow form or pink MOST form)  -  "MOST" Form in Place?  -      TOTAL TIME TAKING CARE OF THIS PATIENT: 40 minutes.    Avel Peace Cameron Katayama M.D on 03/08/2018 at 9:57 AM  Between 7am to 6pm - Pager - [phone]  After 6pm go to [email or web address] - password [email or web address] [facility]  Office  [phone]  CC: Primary care physician; Derinda Late, MD   Note:  This dictation was prepared with Dragon dictation along with smaller phrase technology. Any transcriptional errors that result from this process are unintentional.

## 2018-03-08 NOTE — Care Management (Signed)
Discharging to home today with resumption of  home health RN, PT, SW, Aide, [place] with [facility] is aware of discharge

## 2018-03-09 LAB — ZINC: Zinc: 57 ug/dL (ref 56–134)

## 2018-03-09 LAB — SURGICAL PATHOLOGY

## 2018-03-14 ENCOUNTER — Other Ambulatory Visit (INDEPENDENT_AMBULATORY_CARE_PROVIDER_SITE_OTHER): Payer: Self-pay | Admitting: Nephrology

## 2018-03-14 ENCOUNTER — Other Ambulatory Visit (INDEPENDENT_AMBULATORY_CARE_PROVIDER_SITE_OTHER): Payer: Self-pay | Admitting: Vascular Surgery

## 2018-03-14 DIAGNOSIS — N179 Acute kidney failure, unspecified: Secondary | ICD-10-CM

## 2018-03-14 DIAGNOSIS — N184 Chronic kidney disease, stage 4 (severe): Secondary | ICD-10-CM

## 2018-03-14 DIAGNOSIS — I129 Hypertensive chronic kidney disease with stage 1 through stage 4 chronic kidney disease, or unspecified chronic kidney disease: Secondary | ICD-10-CM

## 2018-03-14 DIAGNOSIS — I701 Atherosclerosis of renal artery: Secondary | ICD-10-CM

## 2018-03-15 ENCOUNTER — Ambulatory Visit (INDEPENDENT_AMBULATORY_CARE_PROVIDER_SITE_OTHER): Payer: Medicare Other

## 2018-03-15 DIAGNOSIS — I129 Hypertensive chronic kidney disease with stage 1 through stage 4 chronic kidney disease, or unspecified chronic kidney disease: Secondary | ICD-10-CM | POA: Diagnosis not present

## 2018-03-15 DIAGNOSIS — N179 Acute kidney failure, unspecified: Secondary | ICD-10-CM

## 2018-03-15 DIAGNOSIS — I701 Atherosclerosis of renal artery: Secondary | ICD-10-CM

## 2018-03-15 DIAGNOSIS — N184 Chronic kidney disease, stage 4 (severe): Secondary | ICD-10-CM | POA: Diagnosis not present

## 2018-03-21 ENCOUNTER — Encounter: Payer: Self-pay | Admitting: Gastroenterology

## 2018-03-23 ENCOUNTER — Ambulatory Visit (INDEPENDENT_AMBULATORY_CARE_PROVIDER_SITE_OTHER): Payer: Medicare Other | Admitting: Gastroenterology

## 2018-03-23 ENCOUNTER — Other Ambulatory Visit: Payer: Self-pay

## 2018-03-23 ENCOUNTER — Encounter: Payer: Self-pay | Admitting: Gastroenterology

## 2018-03-23 VITALS — BP 197/88 | HR 69 | Resp 16 | Wt 147.2 lb

## 2018-03-23 DIAGNOSIS — K5903 Drug induced constipation: Secondary | ICD-10-CM | POA: Diagnosis not present

## 2018-03-23 DIAGNOSIS — K862 Cyst of pancreas: Secondary | ICD-10-CM

## 2018-03-23 DIAGNOSIS — K279 Peptic ulcer, site unspecified, unspecified as acute or chronic, without hemorrhage or perforation: Secondary | ICD-10-CM

## 2018-03-23 DIAGNOSIS — D490 Neoplasm of unspecified behavior of digestive system: Secondary | ICD-10-CM | POA: Diagnosis not present

## 2018-03-23 NOTE — Progress Notes (Signed)
Cephas Darby, MD [address]  [employer]  [place], [place] [address]  Main: [phone]  Fax: [phone]    Gastroenterology Consultation  Referring Provider:     Derinda Late, MD Primary Care Physician:  Derinda Late, MD Primary Gastroenterologist:  Dr. Cephas Darby Reason for Consultation:     Hospital follow-up, peptic ulcer disease        HPI:   Tina Curtis is a 84 y.o. female referred by Dr. Derinda Late, MD  for consultation & management of recent diagnosis of peptic ulcer disease that was performed for intractable nausea, poor appetite and weight loss.  Patient was admitted to [facility] with the above symptoms on 03/06/2018.  Performed her upper endoscopy which revealed clean-based gastric ulcer.  Pathology did not reveal evidence of H. pylori infection.  She was discharged on Protonix 40 mg twice daily.  Since discharge, patient reports that her appetite has improved, overall feeling well.  She does report some ongoing nausea.  I also recommended her to start Zyprexa as she has been depressed since loss of her husband which was about 5 months ago.  She has been suffering from constipation for the last month or so.  Her gabapentin was increased to 300 mg daily.  She has been taking MiraLAX as needed. Patient lives in independent living facility at [facility]  Patient's systolic blood pressure is elevated to 190s in office today.  She reports that generally her blood pressure runs in the 150s over 80s.  She drove herself to the office and appeared to be anxious.  She states she has been taking her blood pressure medication.  She denies headache, lightheadedness, chest pain shortness of breath  NSAIDs: None  Antiplts/Anticoagulants/Anti thrombotics: None  GI Procedures:  EGD 03/07/2018 - Normal duodenal bulb and second portion of the duodenum. Biopsied. - Erythematous mucosa in the greater curvature of the gastric body. Biopsied. - Non-bleeding gastric ulcer  with a clean ulcer base (Forrest Class III). - Normal cardia, gastric fundus, incisura and antrum. Biopsied. - Normal gastroesophageal junction and esophagus. DIAGNOSIS:  A. DUODENUM; COLD BIOPSY:  - UNREMARKABLE DUODENAL MUCOSA.   B. STOMACH, RANDOM; COLD BIOPSY:  - EROSIVE GASTRITIS.  - NEGATIVE FOR H. PYLORI, DYSPLASIA, AND MALIGNANCY.  Past Medical History:  Diagnosis Date  . Arthritis    Gout  . Hypercholesteremia   . Hypertension   . Neuropathy    feet and lower legs  . Seasonal allergies   . Skin cancer    face    Past Surgical History:  Procedure Laterality Date  . ABDOMINAL HYSTERECTOMY  2000  . APPENDECTOMY    . CATARACT EXTRACTION Left   . CATARACT EXTRACTION W/PHACO Right 10/24/2014   Procedure: CATARACT EXTRACTION PHACO AND INTRAOCULAR LENS PLACEMENT (IOC);  Surgeon: Leandrew Koyanagi, MD;  Location: [facility];  Service: Ophthalmology;  Laterality: Right;  . ESOPHAGOGASTRODUODENOSCOPY N/A 03/07/2018   Procedure: ESOPHAGOGASTRODUODENOSCOPY (EGD);  Surgeon: Lin Landsman, MD;  Location[facility] ENDOSCOPY;  Service: Gastroenterology;  Laterality: N/A;    Current Outpatient Medications:  .  acetaminophen (TYLENOL) 500 MG tablet, Take 500 mg by mouth every 6 (six) hours as needed., Disp: , Rfl:  .  allopurinol (ZYLOPRIM) 300 MG tablet, Take 300 mg by mouth daily. AM, Disp: , Rfl:  .  aspirin EC 81 MG tablet, Take 81 mg by mouth daily., Disp: , Rfl:  .  Cholecalciferol (VITAMIN D PO), Take by mouth., Disp: , Rfl:  .  citalopram (CELEXA) 20 MG tablet, Take by mouth., Disp: , Rfl:  .  colchicine 0.6 MG tablet, Take 0.6 mg by mouth daily as needed., Disp: , Rfl:  .  docusate sodium (COLACE) 100 MG capsule, Take 1 capsule (100 mg total) by mouth 2 (two) times daily., Disp: 60 capsule, Rfl: 0 .  feeding supplement, GLUCERNA SHAKE, (GLUCERNA SHAKE) LIQD, Take 237 mLs by mouth 3 (three) times daily between meals., Disp: 30 Can, Rfl: 0 .  gabapentin  (NEURONTIN) 300 MG capsule, Take 300 mg by mouth at bedtime. , Disp: , Rfl:  .  hydrALAZINE (APRESOLINE) 50 MG tablet, Take 1 tablet (50 mg total) by mouth 2 (two) times daily., Disp: 30 tablet, Rfl: 0 .  loratadine (CLARITIN) 10 MG tablet, Take 10 mg by mouth daily as needed for allergies. AM, Disp: , Rfl:  .  losartan (COZAAR) 100 MG tablet, Take 1 tablet (100 mg total) by mouth daily., Disp: 90 tablet, Rfl: 0 .  Meclizine HCl 25 MG CHEW, Chew 1 tablet (25 mg total) by mouth 3 (three) times daily as needed., Disp: 90 each, Rfl: 0 .  metoprolol succinate (TOPROL-XL) 25 MG 24 hr tablet, Take 37.5 mg by mouth at bedtime. , Disp: , Rfl:  .  Multiple Vitamins-Minerals (ICAPS PO), Take by mouth daily., Disp: , Rfl:  .  OLANZapine (ZYPREXA) 2.5 MG tablet, Take 1 tablet (2.5 mg total) by mouth at bedtime., Disp: 90 tablet, Rfl: 0 .  pantoprazole (PROTONIX) 40 MG tablet, Take 1 tablet (40 mg total) by mouth 2 (two) times daily before a meal., Disp: 60 tablet, Rfl: 2 .  polyethylene glycol (MIRALAX / GLYCOLAX) packet, Take 17 g by mouth daily as needed., Disp: 60 each, Rfl: 0 .  prednisoLONE acetate (PRED FORTE) 1 % ophthalmic suspension, INSTILL 1 DROP IN RIGHT EYE 2 TIMES A DAY, Disp: , Rfl:  .  Probiotic Product (ALIGN PO), Take by mouth. AM, Disp: , Rfl:  .  simvastatin (ZOCOR) 20 MG tablet, Take 20 mg by mouth daily. PM, Disp: , Rfl:  .  traZODone (DESYREL) 50 MG tablet, Take 0.5 tablets (25 mg total) by mouth at bedtime., Disp: 30 tablet, Rfl: 0 .  Vitamin D, Ergocalciferol, (DRISDOL) 1.25 MG (50000 UT) CAPS capsule, Take by mouth., Disp: , Rfl:  .  allopurinol (ZYLOPRIM) 100 MG tablet, , Disp: , Rfl: 3 .  gabapentin (NEURONTIN) 100 MG capsule, Take by mouth., Disp: , Rfl:  .  HYDROcodone-acetaminophen (NORCO/VICODIN) 5-325 MG tablet, Take by mouth., Disp: , Rfl:  .  losartan (COZAAR) 50 MG tablet, Take by mouth., Disp: , Rfl:  .  ondansetron (ZOFRAN-ODT) 4 MG disintegrating tablet, Take 4 mg by  mouth every 8 (eight) hours as needed for nausea or vomiting., Disp: , Rfl:  .  promethazine (PHENERGAN) 6.25 MG/5ML syrup, Take 5 mLs (6.25 mg total) by mouth every 6 (six) hours as needed for refractory nausea / vomiting. (Patient not taking: Reported on 03/23/2018), Disp: 120 mL, Rfl: 0 .  scopolamine (TRANSDERM-SCOP) 1 MG/3DAYS, PLACE 1 PATCH ONTO THE SKIN EVERY 3RD DAY, Disp: , Rfl: 0    No family history on file.   Social History   Tobacco Use  . Smoking status: Never Smoker  . Smokeless tobacco: Never Used  Substance Use Topics  . Alcohol use: No  . Drug use: Not on file    Allergies as of 03/23/2018 - Review Complete 03/23/2018  Allergen Reaction Noted  . Penicillins Anaphylaxis 10/15/2014  Review of Systems:    All systems reviewed and negative except where noted in HPI.   Physical Exam:  BP (!) 197/88 (BP Location: Left Arm, Patient Position: Sitting, Cuff Size: Normal)   Pulse 69   Resp 16   Wt 147 lb 3.2 oz (66.8 kg)   BMI 23.76 kg/m  No LMP recorded. Patient has had a hysterectomy.  General:   Alert,  Well-developed, well-nourished, pleasant and cooperative in NAD Head:  Normocephalic and atraumatic. Eyes:  Sclera clear, no icterus.   Conjunctiva pink. Ears:  Normal auditory acuity. Nose:  No deformity, discharge, or lesions. Mouth:  No deformity or lesions,oropharynx pink & moist. Neck:  Supple; no masses or thyromegaly. Lungs:  Respirations even and unlabored.  Clear throughout to auscultation.   No wheezes, crackles, or rhonchi. No acute distress. Heart:  Regular rate and rhythm; no murmurs, clicks, rubs, or gallops. Abdomen:  Normal bowel sounds. Soft, non-tender and non-distended without masses, hepatosplenomegaly or hernias noted.  No guarding or rebound tenderness.   Rectal: Not performed Msk:  Symmetrical without gross deformities. Good, equal movement & strength bilaterally. Pulses:  Normal pulses noted. Extremities:  No clubbing or edema.  No  cyanosis. Neurologic:  Alert and oriented x3;  grossly normal neurologically. Skin:  Intact without significant lesions or rashes. No jaundice. Psych:  Alert and cooperative. Normal mood and affect.  Imaging Studies: Reviewed  Assessment and Plan:   Tina Curtis is a 84 y.o. female with no significant past medical history, with intractable nausea, unintentional weight loss, lack of appetite, chronic constipation and depression.  EGD revealed clean-based gastric ulcer with no evidence of H. pylori.  Likely stress-induced.   Peptic ulcer disease Continue taking Protonix 40 mg twice daily at least for 3 months  Intractable nausea without vomiting Nausea improving Continue Zyprexa 2.5 mg at bedtime  Chronic constipation Advised her to take MiraLAX 1 to 2 cups daily Adequate fluid intake Continue high-fiber diet  Uncontrolled hypertension, asymptomatic I have asked her to follow-up with her primary care doctor  IPMN Based on the recent CT A/P, incidental finding measuring 16 mm in size Schedule MRI pancreas protocol   Follow up in 2 months   Cephas Darby, MD

## 2018-03-23 NOTE — Progress Notes (Unsigned)
Mri

## 2018-03-24 ENCOUNTER — Telehealth: Payer: Self-pay | Admitting: Gastroenterology

## 2018-03-24 NOTE — Telephone Encounter (Signed)
Pt home care is calling pt is not sure why she is having an MRI done what is Dr. Marius Ditch trying to rule out please call pt to explain

## 2018-03-28 NOTE — Telephone Encounter (Signed)
Patient calling back again to discuss why she is scheduled for an MRI. Patient requesting a call back.

## 2018-03-28 NOTE — Telephone Encounter (Signed)
Pt wants to speak with nurse regarding a procedure she had done a few weeks ago

## 2018-03-29 NOTE — Telephone Encounter (Signed)
Spoke with pt and she has been notified that MRI was ordered due to cyst on pancreas, pt verbalized understanding

## 2018-04-01 ENCOUNTER — Ambulatory Visit (INDEPENDENT_AMBULATORY_CARE_PROVIDER_SITE_OTHER): Payer: Medicare Other | Admitting: Vascular Surgery

## 2018-04-01 ENCOUNTER — Encounter (INDEPENDENT_AMBULATORY_CARE_PROVIDER_SITE_OTHER): Payer: Self-pay | Admitting: Vascular Surgery

## 2018-04-01 VITALS — BP 126/69 | HR 80 | Resp 16 | Ht 65.0 in | Wt 154.2 lb

## 2018-04-01 DIAGNOSIS — N184 Chronic kidney disease, stage 4 (severe): Secondary | ICD-10-CM | POA: Diagnosis not present

## 2018-04-01 DIAGNOSIS — N1832 Chronic kidney disease, stage 3b: Secondary | ICD-10-CM | POA: Insufficient documentation

## 2018-04-01 DIAGNOSIS — E7800 Pure hypercholesterolemia, unspecified: Secondary | ICD-10-CM

## 2018-04-01 DIAGNOSIS — I150 Renovascular hypertension: Secondary | ICD-10-CM | POA: Insufficient documentation

## 2018-04-01 DIAGNOSIS — I701 Atherosclerosis of renal artery: Secondary | ICD-10-CM | POA: Diagnosis not present

## 2018-04-01 NOTE — Assessment & Plan Note (Signed)
The patient has significant hypertension requiring multiple medications for control.  She is previously undergone a renal artery duplex. This demonstrated an atrophic left kidney with no flow and markedly elevated velocities in the right renal artery concerning for a greater than 60% stenosis.

## 2018-04-01 NOTE — Progress Notes (Signed)
Patient ID: Tina Curtis, female   [name]: October 10, 1926, 84 y.o.   MRN: [id number]  Chief Complaint  Patient presents with  . New Patient (Initial Visit)    ref Kolluru for renal stenosis    HPI Tina Curtis is a 84 y.o. female.  I am asked to see the patient by Dr. Juleen China for evaluation of renal artery stenosis.  The patient reports decades of hypertension that has been reasonably well controlled with blood pressure medications.  She has had to require more blood pressure medications over time.  She has significant chronic kidney disease with a GFR in the 25 range.  This got as low as 12 when she was hospitalized a little over a month ago but has rebounded to her baseline.  Her nephrologist astutely was with concerned about renal artery perfusion and had a renal artery duplex performed here at our office as a lab only study earlier this month.  This demonstrated an atrophic left kidney with no flow and markedly elevated velocities in the right renal artery concerning for a greater than 60% stenosis.  With these results, she is referred for further evaluation and treatment   Past Medical History:  Diagnosis Date  . Arthritis    Gout  . Hypercholesteremia   . Hypertension   . Neuropathy    feet and lower legs  . Seasonal allergies   . Skin cancer    face    Past Surgical History:  Procedure Laterality Date  . ABDOMINAL HYSTERECTOMY  2000  . APPENDECTOMY    . CATARACT EXTRACTION Left   . CATARACT EXTRACTION W/PHACO Right 10/24/2014   Procedure: CATARACT EXTRACTION PHACO AND INTRAOCULAR LENS PLACEMENT (IOC);  Surgeon: Leandrew Koyanagi, MD;  Location: [facility];  Service: Ophthalmology;  Laterality: Right;  . ESOPHAGOGASTRODUODENOSCOPY N/A 03/07/2018   Procedure: ESOPHAGOGASTRODUODENOSCOPY (EGD);  Surgeon: Lin Landsman, MD;  Location: [facility] ENDOSCOPY;  Service: Gastroenterology;  Laterality: N/A;    Family History  Problem Relation Age of Onset  . Hypertension  Mother   . Hypertension Father   . Stroke Father   3 sisters who had kidney artery problems  Social History Social History   Tobacco Use  . Smoking status: Never Smoker  . Smokeless tobacco: Never Used  Substance Use Topics  . Alcohol use: No  . Drug use: Never    Allergies  Allergen Reactions  . Penicillins Anaphylaxis    Current Outpatient Medications  Medication Sig Dispense Refill  . acetaminophen (TYLENOL) 500 MG tablet Take 500 mg by mouth every 6 (six) hours as needed.    Marland Kitchen allopurinol (ZYLOPRIM) 100 MG tablet   3  . allopurinol (ZYLOPRIM) 300 MG tablet Take 300 mg by mouth daily. AM    . amLODipine (NORVASC) 5 MG tablet   1  . aspirin EC 81 MG tablet Take 81 mg by mouth daily.    . Cholecalciferol (VITAMIN D PO) Take by mouth.    . citalopram (CELEXA) 20 MG tablet Take by mouth.    . colchicine 0.6 MG tablet Take 0.6 mg by mouth daily as needed.    . docusate sodium (COLACE) 100 MG capsule Take 1 capsule (100 mg total) by mouth 2 (two) times daily. 60 capsule 0  . feeding supplement, GLUCERNA SHAKE, (GLUCERNA SHAKE) LIQD Take 237 mLs by mouth 3 (three) times daily between meals. 30 Can 0  . furosemide (LASIX) 20 MG tablet 2 PO daily x 1 week then 1 PO daily  thereafter    . gabapentin (NEURONTIN) 100 MG capsule Take by mouth.    . gabapentin (NEURONTIN) 300 MG capsule Take 300 mg by mouth at bedtime.     . hydrALAZINE (APRESOLINE) 50 MG tablet Take 1 tablet (50 mg total) by mouth 2 (two) times daily. 30 tablet 0  . HYDROcodone-acetaminophen (NORCO/VICODIN) 5-325 MG tablet Take by mouth.    . loratadine (CLARITIN) 10 MG tablet Take 10 mg by mouth daily as needed for allergies. AM    . losartan (COZAAR) 100 MG tablet Take 1 tablet (100 mg total) by mouth daily. 90 tablet 0  . losartan (COZAAR) 50 MG tablet Take by mouth.    . Meclizine HCl 25 MG CHEW Chew 1 tablet (25 mg total) by mouth 3 (three) times daily as needed. 90 each 0  . metoprolol succinate (TOPROL-XL) 25 MG  24 hr tablet Take 37.5 mg by mouth at bedtime.     . Multiple Vitamins-Minerals (ICAPS PO) Take by mouth daily.    Marland Kitchen OLANZapine (ZYPREXA) 2.5 MG tablet Take 1 tablet (2.5 mg total) by mouth at bedtime. 90 tablet 0  . ondansetron (ZOFRAN-ODT) 4 MG disintegrating tablet Take 4 mg by mouth every 8 (eight) hours as needed for nausea or vomiting.    . pantoprazole (PROTONIX) 40 MG tablet Take 1 tablet (40 mg total) by mouth 2 (two) times daily before a meal. 60 tablet 2  . polyethylene glycol (MIRALAX / GLYCOLAX) packet Take 17 g by mouth daily as needed. 60 each 0  . prednisoLONE acetate (PRED FORTE) 1 % ophthalmic suspension INSTILL 1 DROP IN RIGHT EYE 2 TIMES A DAY    . Probiotic Product (ALIGN PO) Take by mouth. AM    . scopolamine (TRANSDERM-SCOP) 1 MG/3DAYS PLACE 1 PATCH ONTO THE SKIN EVERY 3RD DAY  0  . simvastatin (ZOCOR) 20 MG tablet Take 20 mg by mouth daily. PM    . traZODone (DESYREL) 50 MG tablet Take 0.5 tablets (25 mg total) by mouth at bedtime. 30 tablet 0  . Vitamin D, Ergocalciferol, (DRISDOL) 1.25 MG (50000 UT) CAPS capsule Take by mouth.    . promethazine (PHENERGAN) 6.25 MG/5ML syrup Take 5 mLs (6.25 mg total) by mouth every 6 (six) hours as needed for refractory nausea / vomiting. (Patient not taking: Reported on 03/23/2018) 120 mL 0   No current facility-administered medications for this visit.       REVIEW OF SYSTEMS (Negative unless checked)  Constitutional: '[]' Weight loss  '[]' Fever  '[]' Chills Cardiac: '[]' Chest pain   '[]' Chest pressure   '[]' Palpitations   '[]' Shortness of breath when laying flat   '[]' Shortness of breath at rest   '[]' Shortness of breath with exertion. Vascular:  '[]' Pain in legs with walking   '[]' Pain in legs at rest   '[]' Pain in legs when laying flat   '[]' Claudication   '[]' Pain in feet when walking  '[]' Pain in feet at rest  '[]' Pain in feet when laying flat   '[]' History of DVT   '[]' Phlebitis   '[]' Swelling in legs   '[]' Varicose veins   '[]' Non-healing ulcers Pulmonary:   '[]' Uses  home oxygen   '[]' Productive cough   '[]' Hemoptysis   '[]' Wheeze  '[]' COPD   '[]' Asthma Neurologic:  '[]' Dizziness  '[]' Blackouts   '[]' Seizures   '[]' History of stroke   '[]' History of TIA  '[]' Aphasia   '[]' Temporary blindness   '[]' Dysphagia   '[]' Weakness or numbness in arms   '[x]' Weakness or numbness in legs Musculoskeletal:  '[x]' Arthritis   '[]' Joint swelling   [  x]Joint pain   '[]' Low back pain Hematologic:  '[]' Easy bruising  '[]' Easy bleeding   '[]' Hypercoagulable state   '[]' Anemic  '[]' Hepatitis Gastrointestinal:  '[]' Blood in stool   '[]' Vomiting blood  '[]' Gastroesophageal reflux/heartburn   '[]' Abdominal pain Genitourinary:  '[x]' Chronic kidney disease   '[x]' Difficult urination  '[]' Frequent urination  '[]' Burning with urination   '[]' Hematuria Skin:  '[]' Rashes   '[]' Ulcers   '[]' Wounds Psychological:  '[]' History of anxiety   '[]'  History of major depression.    Physical Exam BP 126/69 (BP Location: Left Arm)   Pulse 80   Resp 16   Ht '5\' 5"'  (1.651 m)   Wt 154 lb 3.2 oz (69.9 kg)   BMI 25.66 kg/m  Gen:  WD/WN, NAD.  Appears younger than stated age Head: [place]/AT, No temporalis wasting.  Ear/Nose/Throat: Hearing grossly intact, nares w/o erythema or drainage, oropharynx w/o Erythema/Exudate Eyes: Conjunctiva clear, sclera non-icteric  Neck: trachea midline.   Pulmonary:  Good air movement, respirations not labored, no use of accessory muscles Cardiac: RRR, no JVD Vascular:  Vessel Right Left  Radial Palpable Palpable                          PT Palpable Palpable  DP Palpable Palpable   Gastrointestinal: soft, non-tender/non-distended. Musculoskeletal: M/S 5/5 throughout.  Extremities without ischemic changes.  No deformity or atrophy.  Trace lower extremity edema. Neurologic: Sensation grossly intact in extremities.  Symmetrical.  Speech is fluent. Motor exam as listed above. Psychiatric: Judgment intact, Mood & affect appropriate for pt's clinical situation. Dermatologic: No rashes or ulcers noted.  No cellulitis or open  wounds.    Radiology Ct Abdomen Pelvis Wo Contrast  Result Date: 03/05/2018 CLINICAL DATA:  84 year old female with abdominal pain. EXAM: CT ABDOMEN AND PELVIS WITHOUT CONTRAST TECHNIQUE: Multidetector CT imaging of the abdomen and pelvis was performed following the standard protocol without IV contrast. COMPARISON:  None. FINDINGS: Evaluation of this exam is limited in the absence of intravenous contrast. Lower chest: There are minimal bibasilar subpleural scarring. There is coronary vascular calcification. No intra-abdominal free air or free fluid. Hepatobiliary: The liver is unremarkable. No calcified gallstone. Pancreas: There are multiple low attenuating/cystic lesions involving the pancreas which are not characterized on this noncontrast CT but may represent side branch IPMNs. Further evaluation with MRI is recommended. The largest measures approximately 16 mm in the proximal body of the pancreas. Spleen: Normal in size without focal abnormality. Adrenals/Urinary Tract: The adrenal glands are unremarkable. Moderate left renal atrophy. There is no hydronephrosis or nephrolithiasis on either side. The visualized ureters and urinary bladder appear unremarkable. Stomach/Bowel: There is extensive sigmoid and colonic diverticulosis without active inflammatory changes. There is moderate amount of stool throughout the colon. There is no bowel obstruction or active inflammation. Appendectomy. Vascular/Lymphatic: Advanced aortoiliac atherosclerotic disease. A 3.3 cm infrarenal abdominal aortic aneurysm. No portal venous gas. There is no adenopathy. Reproductive: Hysterectomy. No pelvic mass. Other: None Musculoskeletal: Osteopenia with degenerative changes of the spine. No acute osseous pathology. IMPRESSION: 1. Moderate colonic stool burden. No bowel obstruction or active inflammation. 2. Colonic diverticulosis. 3. Multiple pancreatic cystic lesions as described. Further evaluation with MRI is recommended. 4. A  3.3 cm infrarenal abdominal aortic aneurysm. Recommend followup by ultrasound in 3 years. This recommendation follows ACR consensus guidelines: White Paper of the ACR Incidental Findings Committee II on Vascular Findings. J Am Coll Radiol 2013; 96:295-284 Electronically Signed   By: Anner Crete M.D.   On: 03/05/2018  01:33   US Renal  Result Date: 03/07/2018 CLINICAL DATA:  84 year old female with acute kidney insufficiency and chronic kidney disease stage 4. EXAM: RENAL / URINARY TRACT ULTRASOUND COMPLETE COMPARISON:  04/05/2018 CT. FINDINGS: Right Kidney: Length: 8.3 cm. Echogenicity within normal limits. No mass or hydronephrosis visualized. Left Kidney: Length: 7.4 cm. Renal parenchymal thinning. No mass or hydronephrosis. Bladder: Appears normal for degree of bladder distention. IMPRESSION: 1. No hydronephrosis. 2. Atrophic left kidney with renal parenchymal thinning. Electronically Signed   By: Genia Del M.D.   On: 03/07/2018 18:04   Vas US Renal Artery Duplex  Result Date: 03/18/2018 ABDOMINAL VISCERAL High Risk Factors: Hypertension. Performing Technologist: Almira Coaster RVS  Examination Guidelines: A complete evaluation includes B-mode imaging, spectral Doppler, color Doppler, and power Doppler as needed of all accessible portions of each vessel. Bilateral testing is considered an integral part of a complete examination. Limited examinations for reoccurring indications may be performed as noted.  Duplex Findings: +------------------+--------+--------+-------+ Right Renal ArteryPSV cm/sEDV cm/sComment +------------------+--------+--------+-------+ Origin              384      76           +------------------+--------+--------+-------+ Proximal            235      49           +------------------+--------+--------+-------+ Mid                 131      24           +------------------+--------+--------+-------+ Distal               92      22            +------------------+--------+--------+-------+ Technologist observations Renal Artery(s):RAR in the Right suggest a >60% stenosis. +------------+--------+--------+--+-----------+--------+--------+---+ Right KidneyPSV cm/sEDV cm/sRILeft KidneyPSV cm/sEDV cm/sRI  +------------+--------+--------+--+-----------+--------+--------+---+ Upper Pole                    Upper Pole                     +------------+--------+--------+--+-----------+--------+--------+---+ Mid                           Mid                            +------------+--------+--------+--+-----------+--------+--------+---+ Lower Pole                    Lower Pole                     +------------+--------+--------+--+-----------+--------+--------+---+ Hilar                         Hilar                          +------------+--------+--------+--+-----------+--------+--------+---+ +------------------+----+------------------+----+ Right Kidney          Left Kidney            +------------------+----+------------------+----+ RAR                   RAR                    +------------------+----+------------------+----+ RAR (manual)      3.88RAR (manual)           +------------------+----+------------------+----+  Cortex                Cortex                 +------------------+----+------------------+----+ Cortex thickness      Corex thickness        +------------------+----+------------------+----+ Kidney length (cm)9.00Kidney length (cm)7.10 +------------------+----+------------------+----+  Summary: Largest Aortic Diameter: 3.2 cm  Renal:  Right: Normal size right kidney. Left:  Abnormal size for the left kidney. Unable to obtain        Velocities in the Left Renal artery.         The Left kidney appears to display signs of Atrophy.  *See table(s) above for measurements and observations.  Diagnosing physician: Leotis Pain MD  Electronically signed by Leotis Pain MD on 03/18/2018 at  10:44:56 AM.    Final     Labs Recent Results (from the past 2160 hour(s))  Comprehensive metabolic panel     Status: Abnormal   Collection Time: 02/17/18  4:07 PM  Result Value Ref Range   Sodium 136 135 - 145 mmol/L   Potassium 5.0 3.5 - 5.1 mmol/L   Chloride 105 98 - 111 mmol/L   CO2 20 (L) 22 - 32 mmol/L   Glucose, Bld 117 (H) 70 - 99 mg/dL   BUN 70 (H) 8 - 23 mg/dL   Creatinine, Ser 3.20 (H) 0.44 - 1.00 mg/dL   Calcium 9.5 8.9 - 10.3 mg/dL   Total Protein 7.8 6.5 - 8.1 g/dL   Albumin 4.8 3.5 - 5.0 g/dL   AST 32 15 - 41 U/L   ALT 20 0 - 44 U/L   Alkaline Phosphatase 92 38 - 126 U/L   Total Bilirubin 0.7 0.3 - 1.2 mg/dL   GFR calc non Af Amer 12 (L) >60 mL/min   GFR calc Af Amer 14 (L) >60 mL/min    Comment: (NOTE) The eGFR has been calculated using the CKD EPI equation. This calculation has not been validated in all clinical situations. eGFR's persistently <60 mL/min signify possible Chronic Kidney Disease.    Anion gap 11 5 - 15    Comment: Performed at [facility], [address] [place], [place] [address]  CBC     Status: Abnormal   Collection Time: 02/17/18  4:07 PM  Result Value Ref Range   WBC 8.9 4.0 - 10.5 K/uL   RBC 3.81 (L) 3.87 - 5.11 MIL/uL   Hemoglobin 12.1 12.0 - 15.0 g/dL   HCT 35.7 (L) 36.0 - 46.0 %   MCV 93.7 80.0 - 100.0 fL   MCH 31.8 26.0 - 34.0 pg   MCHC 33.9 30.0 - 36.0 g/dL   RDW 13.2 11.5 - 15.5 %   Platelets 165 150 - 400 K/uL   nRBC 0.0 0.0 - 0.2 %    Comment: Performed at [facility], [address] [place], [place] [address]  Troponin I     Status: None   Collection Time: 02/17/18  4:07 PM  Result Value Ref Range   Troponin I <0.03 <0.03 ng/mL    Comment: Performed at [facility], [address] [place], [place] [address]  Urinalysis, Complete w Microscopic     Status: Abnormal   Collection Time: 02/17/18  4:29 PM  Result Value Ref Range   Color, Urine YELLOW (A) YELLOW   APPearance CLOUDY (A)  CLEAR   Specific Gravity, Urine 1.013 1.005 - 1.030   pH 5.0 5.0 - 8.0  Glucose, UA NEGATIVE NEGATIVE mg/dL   Hgb urine dipstick NEGATIVE NEGATIVE   Bilirubin Urine NEGATIVE NEGATIVE   Ketones, ur NEGATIVE NEGATIVE mg/dL   Protein, ur NEGATIVE NEGATIVE mg/dL   Nitrite NEGATIVE NEGATIVE   Leukocytes, UA MODERATE (A) NEGATIVE   RBC / HPF 0-5 0 - 5 RBC/hpf   WBC, UA >50 (H) 0 - 5 WBC/hpf   Bacteria, UA NONE SEEN NONE SEEN   Squamous Epithelial / LPF 0-5 0 - 5   Mucus PRESENT    Hyaline Casts, UA PRESENT    Granular Casts, UA PRESENT     Comment: Performed at [facility], [address], [place], [place] [address]  Urine culture     Status: Abnormal   Collection Time: 02/17/18  4:29 PM  Result Value Ref Range   Specimen Description      URINE, RANDOM Performed at [facility], [address], [place], [place] [address]    Special Requests      NONE Performed at [facility], [address], [place], [place] [address]    Culture (A)     >=100,000 COLONIES/mL ESCHERICHIA COLI Confirmed Extended Spectrum Beta-Lactamase Producer (ESBL).  In bloodstream infections from ESBL organisms, carbapenems are preferred over piperacillin/tazobactam. They are shown to have a lower risk of mortality.    Report Status 02/21/2018 FINAL    Organism ID, Bacteria ESCHERICHIA COLI (A)       Susceptibility   Escherichia coli - MIC*    AMPICILLIN >=32 RESISTANT Resistant     CEFAZOLIN 16 SENSITIVE Sensitive     CEFTRIAXONE <=1 SENSITIVE Sensitive     CIPROFLOXACIN <=0.25 SENSITIVE Sensitive     GENTAMICIN <=1 SENSITIVE Sensitive     IMIPENEM <=0.25 SENSITIVE Sensitive     NITROFURANTOIN <=16 SENSITIVE Sensitive     TRIMETH/SULFA <=20 SENSITIVE Sensitive     AMPICILLIN/SULBACTAM >=32 RESISTANT Resistant     PIP/TAZO >=128 RESISTANT Resistant     Extended ESBL POSITIVE Resistant     * >=100,000 COLONIES/mL ESCHERICHIA COLI  MRSA PCR Screening     Status: Abnormal    Collection Time: 02/17/18  8:13 PM  Result Value Ref Range   MRSA by PCR POSITIVE (A) NEGATIVE    Comment:        The GeneXpert MRSA Assay (FDA approved for NASAL specimens only), is one component of a comprehensive MRSA colonization surveillance program. It is not intended to diagnose MRSA infection nor to guide or monitor treatment for MRSA infections. RESULT CALLED TO, READ BACK BY AND VERIFIED WITH: C/YASMIN FUENTES '@2240'  02/17/18 FLC Performed at [facility], [address] [place], [place] [address]   CBC     Status: Abnormal   Collection Time: 02/18/18  6:12 AM  Result Value Ref Range   WBC 8.2 4.0 - 10.5 K/uL   RBC 3.37 (L) 3.87 - 5.11 MIL/uL   Hemoglobin 10.9 (L) 12.0 - 15.0 g/dL   HCT 31.9 (L) 36.0 - 46.0 %   MCV 94.7 80.0 - 100.0 fL   MCH 32.3 26.0 - 34.0 pg   MCHC 34.2 30.0 - 36.0 g/dL   RDW 13.1 11.5 - 15.5 %   Platelets 138 (L) 150 - 400 K/uL   nRBC 0.0 0.0 - 0.2 %    Comment: Performed at [facility], [address], [place], [place] [address]  Comprehensive metabolic panel     Status: Abnormal   Collection Time: 02/18/18  6:12 AM  Result Value  Ref Range   Sodium 137 135 - 145 mmol/L   Potassium 4.5 3.5 - 5.1 mmol/L   Chloride 111 98 - 111 mmol/L   CO2 20 (L) 22 - 32 mmol/L   Glucose, Bld 88 70 - 99 mg/dL   BUN 62 (H) 8 - 23 mg/dL   Creatinine, Ser 2.42 (H) 0.44 - 1.00 mg/dL   Calcium 8.6 (L) 8.9 - 10.3 mg/dL   Total Protein 6.6 6.5 - 8.1 g/dL   Albumin 3.9 3.5 - 5.0 g/dL   AST 35 15 - 41 U/L   ALT 19 0 - 44 U/L   Alkaline Phosphatase 78 38 - 126 U/L   Total Bilirubin 0.7 0.3 - 1.2 mg/dL   GFR calc non Af Amer 17 (L) >60 mL/min   GFR calc Af Amer 19 (L) >60 mL/min    Comment: (NOTE) The eGFR has been calculated using the CKD EPI equation. This calculation has not been validated in all clinical situations. eGFR's persistently <60 mL/min signify possible Chronic Kidney Disease.    Anion gap 6 5 - 15    Comment: Performed at  [facility], [address] [place], [place] [address]  Basic metabolic panel     Status: Abnormal   Collection Time: 02/19/18 11:00 AM  Result Value Ref Range   Sodium 141 135 - 145 mmol/L   Potassium 4.3 3.5 - 5.1 mmol/L   Chloride 113 (H) 98 - 111 mmol/L   CO2 19 (L) 22 - 32 mmol/L   Glucose, Bld 89 70 - 99 mg/dL   BUN 47 (H) 8 - 23 mg/dL   Creatinine, Ser 2.08 (H) 0.44 - 1.00 mg/dL   Calcium 9.2 8.9 - 10.3 mg/dL   GFR calc non Af Amer 20 (L) >60 mL/min   GFR calc Af Amer 23 (L) >60 mL/min    Comment: (NOTE) The eGFR has been calculated using the CKD EPI equation. This calculation has not been validated in all clinical situations. eGFR's persistently <60 mL/min signify possible Chronic Kidney Disease.    Anion gap 9 5 - 15    Comment: Performed at [facility], [address] [place], [place] [address]  Basic metabolic panel     Status: Abnormal   Collection Time: 02/21/18  4:34 AM  Result Value Ref Range   Sodium 138 135 - 145 mmol/L   Potassium 4.2 3.5 - 5.1 mmol/L   Chloride 110 98 - 111 mmol/L   CO2 21 (L) 22 - 32 mmol/L   Glucose, Bld 114 (H) 70 - 99 mg/dL   BUN 41 (H) 8 - 23 mg/dL   Creatinine, Ser 1.92 (H) 0.44 - 1.00 mg/dL   Calcium 8.1 (L) 8.9 - 10.3 mg/dL   GFR calc non Af Amer 22 (L) >60 mL/min   GFR calc Af Amer 26 (L) >60 mL/min    Comment: (NOTE) The eGFR has been calculated using the CKD EPI equation. This calculation has not been validated in all clinical situations. eGFR's persistently <60 mL/min signify possible Chronic Kidney Disease.    Anion gap 7 5 - 15    Comment: Performed at [facility], [address] [place], [place] [address]  CBC     Status: Abnormal   Collection Time: 02/21/18 11:00 AM  Result Value Ref Range   WBC 8.4 4.0 - 10.5 K/uL   RBC 3.43 (L) 3.87 - 5.11 MIL/uL   Hemoglobin 10.9 (L) 12.0 - 15.0 g/dL   HCT 32.8 (  L) 36.0 - 46.0 %   MCV 95.6 80.0 - 100.0 fL   MCH 31.8 26.0 - 34.0 pg   MCHC 33.2  30.0 - 36.0 g/dL   RDW 13.5 11.5 - 15.5 %   Platelets 146 (L) 150 - 400 K/uL   nRBC 0.0 0.0 - 0.2 %    Comment: Performed at [facility], [address] [place], [place] [address]  Lipase, blood     Status: Abnormal   Collection Time: 03/04/18 10:28 PM  Result Value Ref Range   Lipase 62 (H) 11 - 51 U/L    Comment: Performed at [facility], [address] [place], [place] [address]  Comprehensive metabolic panel     Status: Abnormal   Collection Time: 03/04/18 10:28 PM  Result Value Ref Range   Sodium 124 (L) 135 - 145 mmol/L   Potassium 3.9 3.5 - 5.1 mmol/L   Chloride 90 (L) 98 - 111 mmol/L   CO2 22 22 - 32 mmol/L   Glucose, Bld 129 (H) 70 - 99 mg/dL   BUN 30 (H) 8 - 23 mg/dL   Creatinine, Ser 1.76 (H) 0.44 - 1.00 mg/dL   Calcium 9.4 8.9 - 10.3 mg/dL   Total Protein 7.6 6.5 - 8.1 g/dL   Albumin 4.5 3.5 - 5.0 g/dL   AST 35 15 - 41 U/L   ALT 24 0 - 44 U/L   Alkaline Phosphatase 93 38 - 126 U/L   Total Bilirubin 0.9 0.3 - 1.2 mg/dL   GFR calc non Af Amer 25 (L) >60 mL/min   GFR calc Af Amer 29 (L) >60 mL/min    Comment: (NOTE) The eGFR has been calculated using the CKD EPI equation. This calculation has not been validated in all clinical situations. eGFR's persistently <60 mL/min signify possible Chronic Kidney Disease.    Anion gap 12 5 - 15    Comment: Performed at [facility], [address] [place], [place] [address]  CBC     Status: Abnormal   Collection Time: 03/04/18 10:28 PM  Result Value Ref Range   WBC 9.1 4.0 - 10.5 K/uL   RBC 3.93 3.87 - 5.11 MIL/uL   Hemoglobin 12.3 12.0 - 15.0 g/dL   HCT 34.9 (L) 36.0 - 46.0 %   MCV 88.8 80.0 - 100.0 fL   MCH 31.3 26.0 - 34.0 pg   MCHC 35.2 30.0 - 36.0 g/dL   RDW 12.3 11.5 - 15.5 %   Platelets 232 150 - 400 K/uL   nRBC 0.0 0.0 - 0.2 %    Comment: Performed at [facility], [address] [place], [place] [address]  Lactic acid, plasma     Status: None   Collection Time:  03/05/18  1:33 AM  Result Value Ref Range   Lactic Acid, Venous 1.3 0.5 - 1.9 mmol/L    Comment: Performed at [facility], [address] [place], [place] [address]  Urinalysis, Complete w Microscopic     Status: Abnormal   Collection Time: 03/05/18  3:55 AM  Result Value Ref Range   Color, Urine STRAW (A) YELLOW   APPearance CLEAR (A) CLEAR   Specific Gravity, Urine 1.005 1.005 - 1.030   pH 7.0 5.0 - 8.0   Glucose, UA NEGATIVE NEGATIVE mg/dL   Hgb urine dipstick NEGATIVE NEGATIVE   Bilirubin Urine NEGATIVE NEGATIVE   Ketones, ur NEGATIVE NEGATIVE mg/dL   Protein, ur NEGATIVE NEGATIVE mg/dL   Nitrite NEGATIVE NEGATIVE  Leukocytes, UA NEGATIVE NEGATIVE   RBC / HPF 0-5 0 - 5 RBC/hpf   WBC, UA 0-5 0 - 5 WBC/hpf   Bacteria, UA NONE SEEN NONE SEEN   Squamous Epithelial / LPF 0-5 0 - 5    Comment: Performed at [facility], [address] [place], [place] [address]  MRSA PCR Screening     Status: None   Collection Time: 03/05/18  5:05 AM  Result Value Ref Range   MRSA by PCR NEGATIVE NEGATIVE    Comment:        The GeneXpert MRSA Assay (FDA approved for NASAL specimens only), is one component of a comprehensive MRSA colonization surveillance program. It is not intended to diagnose MRSA infection nor to guide or monitor treatment for MRSA infections. Performed at [facility], [address] [place], [place] [address]   Lactic acid, plasma     Status: None   Collection Time: 03/05/18  5:30 AM  Result Value Ref Range   Lactic Acid, Venous 1.0 0.5 - 1.9 mmol/L    Comment: Performed at [facility], [address] [place], [place] [address]  Basic metabolic panel     Status: Abnormal   Collection Time: 03/05/18  5:31 AM  Result Value Ref Range   Sodium 128 (L) 135 - 145 mmol/L   Potassium 4.0 3.5 - 5.1 mmol/L   Chloride 96 (L) 98 - 111 mmol/L   CO2 23 22 - 32 mmol/L   Glucose, Bld 103 (H) 70 - 99 mg/dL   BUN 28 (H) 8 - 23 mg/dL     Creatinine, Ser 1.69 (H) 0.44 - 1.00 mg/dL   Calcium 8.8 (L) 8.9 - 10.3 mg/dL   GFR calc non Af Amer 26 (L) >60 mL/min   GFR calc Af Amer 30 (L) >60 mL/min    Comment: (NOTE) The eGFR has been calculated using the CKD EPI equation. This calculation has not been validated in all clinical situations. eGFR's persistently <60 mL/min signify possible Chronic Kidney Disease.    Anion gap 9 5 - 15    Comment: Performed at [facility], [address] [place], [place] [address]  Basic metabolic panel     Status: Abnormal   Collection Time: 03/06/18  6:04 AM  Result Value Ref Range   Sodium 133 (L) 135 - 145 mmol/L   Potassium 4.0 3.5 - 5.1 mmol/L   Chloride 102 98 - 111 mmol/L   CO2 27 22 - 32 mmol/L   Glucose, Bld 100 (H) 70 - 99 mg/dL   BUN 31 (H) 8 - 23 mg/dL   Creatinine, Ser 1.84 (H) 0.44 - 1.00 mg/dL   Calcium 8.5 (L) 8.9 - 10.3 mg/dL   GFR calc non Af Amer 23 (L) >60 mL/min   GFR calc Af Amer 27 (L) >60 mL/min    Comment: (NOTE) The eGFR has been calculated using the CKD EPI equation. This calculation has not been validated in all clinical situations. eGFR's persistently <60 mL/min signify possible Chronic Kidney Disease.    Anion gap 4 (L) 5 - 15    Comment: Performed at [facility], [address] [place], [place] [address]  Vitamin B12     Status: None   Collection Time: 03/06/18  4:32 PM  Result Value Ref Range   Vitamin B-12 401 180 - 914 pg/mL    Comment: (NOTE) This assay is not validated for testing neonatal or myeloproliferative syndrome specimens for Vitamin B12  levels. Performed at [facility], [place] [address] [place], [place] [address]   Ferritin     Status: None   Collection Time: 03/06/18  4:32 PM  Result Value Ref Range   Ferritin 115 11 - 307 ng/mL    Comment: Performed at [facility], [address] [place], [place] [address]  Folate     Status: None   Collection Time: 03/06/18  4:32 PM  Result Value Ref  Range   Folate 19.7 >5.9 ng/mL    Comment: Performed at [facility], [address] [place], [place] [address]  Zinc     Status: None   Collection Time: 03/06/18  4:32 PM  Result Value Ref Range   Zinc 57 56 - 134 ug/dL    Comment: (NOTE) This test was developed and its performance characteristics determined by [facility]. It has not been cleared or approved by the Food and Drug Administration.                                Detection Limit = 5 Performed At: [facility] [place], [place] [id number] Rush Farmer MD HB:[id number]   Surgical pathology     Status: None   Collection Time: 03/07/18  2:56 PM  Result Value Ref Range   SURGICAL PATHOLOGY      Surgical Pathology CASE: [id number] PATIENT: Tina Curtis Surgical Pathology Report     SPECIMEN SUBMITTED: A. Duodenum; cbx B. Stomach, random; cbx  CLINICAL HISTORY: 3 weeks history of intractable nausea and intermittent diarrhea, nonbloody  PRE-OPERATIVE DIAGNOSIS: Intractable nausea  POST-OPERATIVE DIAGNOSIS: Gastric erythema; gastric ulcer     DIAGNOSIS: A. DUODENUM; COLD BIOPSY: - UNREMARKABLE DUODENAL MUCOSA.  B.  STOMACH, RANDOM; COLD BIOPSY: - EROSIVE GASTRITIS. - NEGATIVE FOR H. PYLORI, DYSPLASIA, AND MALIGNANCY.  GROSS DESCRIPTION: A. Labeled: C BX duodenum Received: Formalin Tissue fragment(s): 1 Size: 0.4 cm Description: Tan soft tissue fragment Entirely submitted in one cassette.  B. Labeled: C BX random gastric Received: Formalin Tissue fragment(s): Multiple Size: Aggregate, 1.2 x 0.3 x 0.2 cm Description: Tan soft tissue fragments Entirely submitted in one cassette.   Final Diagnosis performed by Quay Burow, MD.   Gladys Damme ctronically signed 03/09/2018 9:33:26AM The electronic signature indicates that the named Attending Pathologist has evaluated the specimen  Technical component performed at [facility], [address] [place], [place] [address] Lab:  [phone] Dir: Rush Farmer, MD, MMM  Professional component performed at [facility], [address], [place], [place] [address] Lab: [phone] Dir: Dellia Nims. Rubinas, MD   Renal function panel     Status: Abnormal   Collection Time: 03/08/18  6:35 AM  Result Value Ref Range   Sodium 136 135 - 145 mmol/L   Potassium 4.1 3.5 - 5.1 mmol/L   Chloride 107 98 - 111 mmol/L   CO2 25 22 - 32 mmol/L   Glucose, Bld 85 70 - 99 mg/dL   BUN 25 (H) 8 - 23 mg/dL   Creatinine, Ser 1.75 (H) 0.44 - 1.00 mg/dL   Calcium 8.5 (L) 8.9 - 10.3 mg/dL   Phosphorus 2.5 2.5 - 4.6 mg/dL   Albumin 3.4 (L) 3.5 - 5.0 g/dL   GFR calc non Af Amer 25 (L) >60 mL/min   GFR calc Af Amer 29 (L) >60 mL/min    Comment: (NOTE) The eGFR has been calculated using the CKD EPI equation. This calculation  has not been validated in all clinical situations. eGFR's persistently <60 mL/min signify possible Chronic Kidney Disease.    Anion gap 4 (L) 5 - 15    Comment: Performed at [facility], [address] [place], [place] [address]    Assessment/Plan:  Pure hypercholesterolemia lipid control important in reducing the progression of atherosclerotic disease. Continue statin therapy   Chronic kidney disease (CKD), stage IV (severe) (HCC) Last GFR was about 25.  This is likely multifactorial with hypertension, but ischemic nephropathy is also likely present.  Renovascular hypertension The patient has significant hypertension requiring multiple medications for control.  She is previously undergone a renal artery duplex. This demonstrated an atrophic left kidney with no flow and markedly elevated velocities in the right renal artery concerning for a greater than 60% stenosis.  Renal artery stenosis [facility]) The patient has significant hypertension requiring multiple medications for control.  She is previously undergone a renal artery duplex. This demonstrated an atrophic left kidney  with no flow and markedly elevated velocities in the right renal artery concerning for a greater than 60% stenosis. Given her multi-medication blood pressure issues and her kidney dysfunction, a solitary kidney with renal artery stenosis represents a very serious and worrisome situation.  She is at high risk for progression to dialysis dependent renal failure I have discussed renal artery stenting.  I discussed the reason and rationale for treatment.  I think she would benefit from renal artery angiogram with possible renal artery intervention.  Risks and benefits are discussed and she is agreeable to proceed.      Leotis Pain 04/01/2018, 3:30 PM   This note was created with Dragon medical transcription system.  Any errors from dictation are unintentional.

## 2018-04-01 NOTE — Assessment & Plan Note (Signed)
The patient has significant hypertension requiring multiple medications for control.  She is previously undergone a renal artery duplex. This demonstrated an atrophic left kidney with no flow and markedly elevated velocities in the right renal artery concerning for a greater than 60% stenosis. Given her multi-medication blood pressure issues and her kidney dysfunction, a solitary kidney with renal artery stenosis represents a very serious and worrisome situation.  She is at high risk for progression to dialysis dependent renal failure I have discussed renal artery stenting.  I discussed the reason and rationale for treatment.  I think she would benefit from renal artery angiogram with possible renal artery intervention.  Risks and benefits are discussed and she is agreeable to proceed.

## 2018-04-01 NOTE — Patient Instructions (Signed)
Renal Artery Stenosis Renal artery stenosis (RAS) is narrowing of the artery that carries blood to your kidneys. It can affect one or both kidneys. Your kidneys filter waste and extra fluid from your blood. You get rid of the waste and fluid when you urinate. Your kidneys also make an important chemical messenger (hormone) called renin. Renin helps regulate your blood pressure. The first sign of RAS may be high blood pressure. Over time, other symptoms can develop. What are the causes? Plaque buildup in your arteries (atherosclerosis) is the main cause of RAS. The plaques that cause this are made up of:  Fat.  Cholesterol.  Calcium.  Other substances.  As these substances build up in your renal artery, this slows the blood supply to your kidneys. The lack of blood and oxygen causes the signs and symptoms of RAS. A much less common cause of RAS is a disease called fibromuscular dysplasia. This disease causes abnormal cell growth that narrows the renal artery. It is not related to atherosclerosis. It occurs mostly in women who are 25-50 years old. It may be passed down through families. What increases the risk? You may be at risk for renal artery stenosis if you:  Are a man who is at least 82 years old.  Are a woman who is at least 82 years old.  Have high blood pressure.  Have high cholesterol.  Are a smoker.  Abuse alcohol.  Have diabetes or prediabetes.  Are overweight.  Have a family history of early heart disease.  What are the signs or symptoms? RAS usually develops slowly. You may not have any signs or symptoms at first. The earliest signs may be:  Developing high blood pressure.  A sudden increase in existing high blood pressure.  No longer responding to medicine that used to control your blood pressure.  Later signs and symptoms are due to kidney damage. They may include:  Fatigue.  Shortness of breath.  Swollen legs and feet.  Dry  skin.  Headaches.  Muscle cramps.  Loss of appetite.  Nausea or vomiting.  How is this diagnosed? Your health care provider may suspect RAS based on changes in your blood pressure and your risk factors. A physical exam will be done. Your health care provider may use a stethoscope to listen for a whooshing sound (bruit) that can occur where the renal artery is blocking blood flow. Several tests may be done to confirm a diagnosis of RAS. These may include:  Blood and urine tests to check your kidney function.  Imaging tests of your kidneys, such as: ? A test that involves using sound waves to create an image of your kidneys and the blood flow to your kidneys (ultrasound). ? A test in which dye is injected into one of your blood vessels so images can be taken as the dye flows through your renal arteries (angiogram). These tests can be done using X-rays, a CT scan (computed tomography angiogram, CTA), or a type of MRI (magnetic resonance angiogram, MRA).  How is this treated? Making lifestyle changes to reduce your risk factors is the first treatment option for early RAS. If the blood flow to one of your kidneys is cut by more than half, you may need medicine to:  Lower your blood pressure. This is the main medical treatment for RAS. You may need more than one type of medicine for this. The two types that work best for RAS are: ? ACE inhibitors. ? Angiotensin receptor blockers.  Reduce fluid   in the body (diuretics).  Lower your cholesterol (statins).  If medicine is not enough to control RAS, you may need surgery. This may involve:  Threading a tube with an inflatable balloon into the renal artery to force it open (angioplasty).  Removing plaque from inside the artery (endarterectomy).  Follow these instructions at home:  Take medicines only as directed by your health care provider.  Make any lifestyle changes recommended by your health care provider. This may include: ? Working  with a dietitian to maintain a heart-healthy diet. This type of diet is low in saturated fat, salt, and added sugar. ? Starting an exercise program as directed by your health care provider. ? Maintaining a healthy weight. ? Quitting smoking. ? Not abusing alcohol.  Keep all follow-up visits as directed by your health care provider. This is important. Contact a health care provider if:  Your symptoms of RAS are not getting better.  Your symptoms are changing or getting worse. Get help right away if:  You have very bad pain in your back or abdomen.  You have blood in your urine. This information is not intended to replace advice given to you by your health care provider. Make sure you discuss any questions you have with your health care provider. Document Released: 01/21/2005 Document Revised: 10/03/2015 Document Reviewed: 08/10/2013 Elsevier Interactive Patient Education  2018 Elsevier Inc.  

## 2018-04-01 NOTE — Assessment & Plan Note (Signed)
Last GFR was about 25.  This is likely multifactorial with hypertension, but ischemic nephropathy is also likely present.

## 2018-04-01 NOTE — Assessment & Plan Note (Signed)
lipid control important in reducing the progression of atherosclerotic disease. Continue statin therapy  

## 2018-04-04 ENCOUNTER — Encounter (INDEPENDENT_AMBULATORY_CARE_PROVIDER_SITE_OTHER): Payer: Self-pay

## 2018-04-06 ENCOUNTER — Other Ambulatory Visit (INDEPENDENT_AMBULATORY_CARE_PROVIDER_SITE_OTHER): Payer: Self-pay | Admitting: Vascular Surgery

## 2018-04-10 ENCOUNTER — Ambulatory Visit: Admission: RE | Admit: 2018-04-10 | Payer: Medicare Other | Source: Ambulatory Visit

## 2018-04-11 ENCOUNTER — Encounter: Payer: Self-pay | Admitting: *Deleted

## 2018-04-11 ENCOUNTER — Other Ambulatory Visit: Payer: Self-pay

## 2018-04-11 ENCOUNTER — Ambulatory Visit
Admission: RE | Admit: 2018-04-11 | Discharge: 2018-04-11 | Disposition: A | Discharge status: Home / Self Care | Payer: Medicare Other | Source: Ambulatory Visit | Attending: Vascular Surgery | Admitting: Vascular Surgery

## 2018-04-11 ENCOUNTER — Encounter
Admission: RE | Disposition: A | Discharge status: Home / Self Care | Payer: Self-pay | Source: Ambulatory Visit | Attending: Vascular Surgery

## 2018-04-11 DIAGNOSIS — Z880 Allergy status to penicillin: Secondary | ICD-10-CM | POA: Diagnosis not present

## 2018-04-11 DIAGNOSIS — M109 Gout, unspecified: Secondary | ICD-10-CM | POA: Diagnosis not present

## 2018-04-11 DIAGNOSIS — Z9889 Other specified postprocedural states: Secondary | ICD-10-CM | POA: Insufficient documentation

## 2018-04-11 DIAGNOSIS — G629 Polyneuropathy, unspecified: Secondary | ICD-10-CM | POA: Insufficient documentation

## 2018-04-11 DIAGNOSIS — Z9842 Cataract extraction status, left eye: Secondary | ICD-10-CM | POA: Diagnosis not present

## 2018-04-11 DIAGNOSIS — N280 Ischemia and infarction of kidney: Secondary | ICD-10-CM | POA: Diagnosis not present

## 2018-04-11 DIAGNOSIS — I701 Atherosclerosis of renal artery: Secondary | ICD-10-CM

## 2018-04-11 DIAGNOSIS — I10 Essential (primary) hypertension: Secondary | ICD-10-CM | POA: Diagnosis not present

## 2018-04-11 DIAGNOSIS — Z9841 Cataract extraction status, right eye: Secondary | ICD-10-CM | POA: Diagnosis not present

## 2018-04-11 DIAGNOSIS — E7800 Pure hypercholesterolemia, unspecified: Secondary | ICD-10-CM | POA: Insufficient documentation

## 2018-04-11 DIAGNOSIS — Z90710 Acquired absence of both cervix and uterus: Secondary | ICD-10-CM | POA: Insufficient documentation

## 2018-04-11 DIAGNOSIS — Z823 Family history of stroke: Secondary | ICD-10-CM | POA: Insufficient documentation

## 2018-04-11 DIAGNOSIS — Z85828 Personal history of other malignant neoplasm of skin: Secondary | ICD-10-CM | POA: Diagnosis not present

## 2018-04-11 DIAGNOSIS — Z8249 Family history of ischemic heart disease and other diseases of the circulatory system: Secondary | ICD-10-CM | POA: Diagnosis not present

## 2018-04-11 DIAGNOSIS — I150 Renovascular hypertension: Secondary | ICD-10-CM | POA: Insufficient documentation

## 2018-04-11 DIAGNOSIS — Z79899 Other long term (current) drug therapy: Secondary | ICD-10-CM | POA: Insufficient documentation

## 2018-04-11 HISTORY — PX: RENAL ANGIOGRAPHY: CATH118260

## 2018-04-11 HISTORY — DX: Diarrhea, unspecified: R19.7

## 2018-04-11 SURGERY — RENAL ANGIOGRAPHY
Anesthesia: Moderate Sedation | Laterality: Right

## 2018-04-11 MED ORDER — METHYLPREDNISOLONE SODIUM SUCC 125 MG IJ SOLR: 125.0000 mg | INTRAMUSCULAR | Status: DC | PRN

## 2018-04-11 MED ORDER — CLINDAMYCIN PHOSPHATE 300 MG/50ML IV SOLN
300.0000 mg | Freq: Once | INTRAVENOUS | Status: AC
  Administered 2018-04-11: 300 mg via INTRAVENOUS

## 2018-04-11 MED ORDER — ONDANSETRON HCL 4 MG/2ML IJ SOLN: 4.0000 mg | Freq: Four times a day (QID) | INTRAMUSCULAR | Status: DC | PRN

## 2018-04-11 MED ORDER — LIDOCAINE-EPINEPHRINE (PF) 1 %-1:200000 IJ SOLN
INTRAMUSCULAR | Status: AC
  Filled 2018-04-11: qty 10

## 2018-04-11 MED ORDER — IOPAMIDOL (ISOVUE-300) INJECTION 61%
INTRAVENOUS | Status: DC | PRN
  Administered 2018-04-11: 40 mL via INTRA_ARTERIAL

## 2018-04-11 MED ORDER — HEPARIN (PORCINE) IN NACL 1000-0.9 UT/500ML-% IV SOLN
INTRAVENOUS | Status: AC
  Filled 2018-04-11: qty 1000

## 2018-04-11 MED ORDER — HYDROMORPHONE HCL 1 MG/ML IJ SOLN: 1.0000 mg | Freq: Once | INTRAMUSCULAR | Status: DC | PRN

## 2018-04-11 MED ORDER — CLOPIDOGREL BISULFATE 75 MG PO TABS: 75.0000 mg | ORAL_TABLET | Freq: Every day | ORAL | 11 refills | Status: DC

## 2018-04-11 MED ORDER — FENTANYL CITRATE (PF) 100 MCG/2ML IJ SOLN
INTRAMUSCULAR | Status: AC
  Filled 2018-04-11: qty 2

## 2018-04-11 MED ORDER — CLINDAMYCIN PHOSPHATE 300 MG/50ML IV SOLN
INTRAVENOUS | Status: AC
  Administered 2018-04-11: 300 mg via INTRAVENOUS
  Filled 2018-04-11: qty 50

## 2018-04-11 MED ORDER — FAMOTIDINE 20 MG PO TABS: 40.0000 mg | ORAL_TABLET | ORAL | Status: DC | PRN

## 2018-04-11 MED ORDER — SODIUM CHLORIDE 0.9 % IV SOLN
Freq: Once | INTRAVENOUS | Status: AC
  Administered 2018-04-11: 1000 mL via INTRAVENOUS

## 2018-04-11 MED ORDER — FENTANYL CITRATE (PF) 100 MCG/2ML IJ SOLN
INTRAMUSCULAR | Status: DC | PRN
  Administered 2018-04-11: 50 ug via INTRAVENOUS

## 2018-04-11 MED ORDER — HEPARIN SODIUM (PORCINE) 1000 UNIT/ML IJ SOLN
INTRAMUSCULAR | Status: AC
  Filled 2018-04-11: qty 1

## 2018-04-11 MED ORDER — MIDAZOLAM HCL 5 MG/5ML IJ SOLN
INTRAMUSCULAR | Status: AC
  Filled 2018-04-11: qty 5

## 2018-04-11 MED ORDER — SODIUM CHLORIDE 0.9 % IV SOLN
INTRAVENOUS | Status: DC
  Administered 2018-04-11: 1000 mL via INTRAVENOUS

## 2018-04-11 MED ORDER — MIDAZOLAM HCL 2 MG/2ML IJ SOLN
INTRAMUSCULAR | Status: DC | PRN
  Administered 2018-04-11: 2 mg via INTRAVENOUS

## 2018-04-11 MED ORDER — HEPARIN SODIUM (PORCINE) 1000 UNIT/ML IJ SOLN
INTRAMUSCULAR | Status: DC | PRN
  Administered 2018-04-11: 4000 [IU] via INTRAVENOUS

## 2018-04-11 SURGICAL SUPPLY — 14 items
CATH BEACON 5 .035 65 C2 TIP (CATHETERS) ×2 IMPLANT
CATH PIG 70CM (CATHETERS) ×2 IMPLANT
COVER PROBE U/S 5X48 (MISCELLANEOUS) ×2 IMPLANT
DEVICE PRESTO INFLATION (MISCELLANEOUS) ×2 IMPLANT
DEVICE STARCLOSE SE CLOSURE (Vascular Products) ×2 IMPLANT
GLIDEWIRE STIFF .35X180X3 HYDR (WIRE) ×4 IMPLANT
PACK ANGIOGRAPHY (CUSTOM PROCEDURE TRAY) ×2 IMPLANT
SHEATH ANL2 6FRX45 HC (SHEATH) ×2 IMPLANT
SHEATH BRITE TIP 5FRX11 (SHEATH) ×2 IMPLANT
STENT LIFESTREAM 6X16X80 (Permanent Stent) ×2 IMPLANT
TOWEL OR 17X26 4PK STRL BLUE (TOWEL DISPOSABLE) ×2 IMPLANT
TUBING CONTRAST HIGH PRESS 72 (TUBING) ×2 IMPLANT
WIRE J 3MM .035X145CM (WIRE) ×2 IMPLANT
WIRE MAGIC TOR.035 180C (WIRE) ×2 IMPLANT

## 2018-04-11 NOTE — H&P (Signed)
[facility] History & Physical Update  The patient was interviewed and re-examined.  The patient's previous History and Physical has been reviewed and is unchanged.  There is no change in the plan of care. We plan to proceed with the scheduled procedure.  Leotis Pain, MD  04/11/2018, 1:52 PM

## 2018-04-11 NOTE — OR Nursing (Signed)
Dr Lucky Cowboy notified of labs drawn 03/08/18 GFR 25. No need to redraw blood and order given for 250 ml ns bolus over one hour

## 2018-04-11 NOTE — Op Note (Signed)
[facility] Percutaneous Study/Intervention Procedural Note    Surgeon(s): [employer]  Assistants: None  Pre-operative Diagnosis: Left renal artery occlusion, right renal artery stenosis, renovascular hypertension, ischemic nephropathy  Post-operative diagnosis: Same  Procedure(s) Performed: 1. Ultrasound guidance for vascular access right femoral artery 2. Catheter placement into right renal artery from right femoral approach 3. Aortogram and selective right renal angiogram 4. Balloon expandable stent placement to the right renal artery with a lifestream 6 mm diameter x 16 mm length stent  5. StarClose closure device right femoral artery  Contrast: 40 cc  EBL: 5 cc  Fluoro Time: 4.9 minutes  Moderate conscious sedation: Approximately 40 minutes with 2 mg of Versed and 50 Mcg of Fentanyl  Indications: The patient is an 84 year old white female with worsening severe hypertension despite multiple medications. The patient has suboptimal blood pressure control despite multiple antihypertensives and a noninvasive study demonstrating hemodynamically significant right renal artery stenosis as well as a suspected left renal artery stenosis. Given the clinical scenario and the noninvasive findings, angiogram is indicated for further evaluation of her renal artery and potential treatment. Risks and benefits are discussed and informed consent is obtained.  Procedure: The patient was identified and appropriate procedural time out was performed. The patient was then placed supine on the table and prepped and draped in the usual sterile fashion.Moderate conscious sedation was administered with a face to face encounter with the patient throughout the procedure with my supervision of the RN administering medicines and monitoring the patients vital signs and mental status throughout from the start  of the procedure until the patient was taken to the recovery room  Ultrasound was used to evaluate the right common femoral artery. It was patent . A digital ultrasound image was acquired. A Seldinger needle was used to access the right common femoral artery under direct ultrasound guidance and a permanent image was performed. A 0.035 J wire was advanced without resistance and a 5Fr sheath was placed. Pigtail catheter was placed into the aorta at the L1 level and an AP aortogram was performed. This demonstrated that the aorta was ectatic to mildly aneurysmal.  It was also highly calcific.  The left renal artery was occluded and essentially no flow was seen in the kidney beyond a proximal stump of the vessel.  The right renal artery was stenotic although in the AP projection was hard to see the origin and determine the degree of stenosis. The patient was then systemically heparinized with 4000 units of intravenous heparin. I used a C2 catheter to cannulate the right renal artery and selective imaging was performed. This confirmed a 75 to 80% stenosis of the right renal artery.  At this point I selected the Magic torque wire and crossed the lesion without difficulty.  I then upsized to a 6 [place] Ansell sheath over the Magic torque wire. I then selected a 6 mm diameter x 16 mm length balloon expandable stent and brought this across the lesion.  This was deployed encompassing the lesion with its proximal extent going back into the aorta for a mm or two.  This was inflated to 12 ATM and the waist resolved.  Completion angiogram showed a brisk nephrogram with a widely patent right renal artery stent and basically no residual stenosis.  The guide catheter was removed. Oblique arteriogram was performed of the right femoral artery and StarClose closure device was deployed in the usual fashion with excellent hemostatic result. The patient was taken to the recovery  room in stable condition having tolerated the  procedure well.  Findings:  Aortogram/Renal Arteries:The aorta was ectatic to mildly aneurysmal.  It was also highly calcific.  The left renal artery was occluded and essentially no flow was seen in the kidney beyond a proximal stump of the vessel.  The right renal artery was stenotic although in the AP projection was hard to see the origin and determine the degree of stenosis.  Selective cannulation with the C2 catheter demonstrated about a 75 to 80% right renal artery stenosis when placed in a mild RAO projection.   Condition:  Stable  Complications: None   Leotis Pain 04/11/2018 3:52 PM  This note was created with Dragon Medical transcription system. Any errors in dictation are purely unintentional.

## 2018-04-12 ENCOUNTER — Encounter: Payer: Self-pay | Admitting: Vascular Surgery

## 2018-04-12 ENCOUNTER — Telehealth: Payer: Self-pay | Admitting: Gastroenterology

## 2018-04-12 NOTE — Telephone Encounter (Signed)
Megan from [facility] MRI dept. Is calling pt has MRI tomorrow and her GFI is to low for contrast she wants to know if it is ok to do without ?/ please call [phone]

## 2018-04-12 NOTE — Telephone Encounter (Signed)
[facility] Dept will resume with MRI without contrast.

## 2018-04-13 ENCOUNTER — Ambulatory Visit
Admission: RE | Admit: 2018-04-13 | Discharge: 2018-04-13 | Disposition: A | Discharge status: Home / Self Care | Payer: Medicare Other | Source: Ambulatory Visit | Attending: Gastroenterology | Admitting: Gastroenterology

## 2018-04-13 DIAGNOSIS — I714 Abdominal aortic aneurysm, without rupture: Secondary | ICD-10-CM | POA: Insufficient documentation

## 2018-04-13 DIAGNOSIS — K862 Cyst of pancreas: Secondary | ICD-10-CM | POA: Insufficient documentation

## 2018-04-21 ENCOUNTER — Other Ambulatory Visit
Admission: RE | Admit: 2018-04-21 | Discharge: 2018-04-21 | Disposition: A | Discharge status: Home / Self Care | Payer: Medicare Other | Source: Ambulatory Visit | Attending: Cardiology | Admitting: Cardiology

## 2018-04-21 DIAGNOSIS — I150 Renovascular hypertension: Secondary | ICD-10-CM | POA: Diagnosis not present

## 2018-04-21 DIAGNOSIS — R0602 Shortness of breath: Secondary | ICD-10-CM | POA: Insufficient documentation

## 2018-04-21 DIAGNOSIS — I701 Atherosclerosis of renal artery: Secondary | ICD-10-CM | POA: Insufficient documentation

## 2018-04-21 DIAGNOSIS — I10 Essential (primary) hypertension: Secondary | ICD-10-CM | POA: Diagnosis present

## 2018-04-21 LAB — BRAIN NATRIURETIC PEPTIDE: B Natriuretic Peptide: 395 pg/mL — ABNORMAL HIGH (ref 0.0–100.0)

## 2018-05-14 ENCOUNTER — Inpatient Hospital Stay
Admission: EM | Admit: 2018-05-14 | Discharge: 2018-05-17 | DRG: 683 | Disposition: A | Discharge status: Home Health | Payer: Medicare Other | Attending: Internal Medicine | Admitting: Internal Medicine

## 2018-05-14 ENCOUNTER — Other Ambulatory Visit: Payer: Self-pay

## 2018-05-14 DIAGNOSIS — Z85828 Personal history of other malignant neoplasm of skin: Secondary | ICD-10-CM

## 2018-05-14 DIAGNOSIS — Z79899 Other long term (current) drug therapy: Secondary | ICD-10-CM | POA: Diagnosis not present

## 2018-05-14 DIAGNOSIS — H532 Diplopia: Secondary | ICD-10-CM | POA: Diagnosis present

## 2018-05-14 DIAGNOSIS — E872 Acidosis: Secondary | ICD-10-CM | POA: Diagnosis not present

## 2018-05-14 DIAGNOSIS — F329 Major depressive disorder, single episode, unspecified: Secondary | ICD-10-CM | POA: Diagnosis not present

## 2018-05-14 DIAGNOSIS — E871 Hypo-osmolality and hyponatremia: Secondary | ICD-10-CM | POA: Diagnosis not present

## 2018-05-14 DIAGNOSIS — R42 Dizziness and giddiness: Secondary | ICD-10-CM | POA: Diagnosis not present

## 2018-05-14 DIAGNOSIS — N179 Acute kidney failure, unspecified: Secondary | ICD-10-CM | POA: Diagnosis present

## 2018-05-14 DIAGNOSIS — M109 Gout, unspecified: Secondary | ICD-10-CM | POA: Diagnosis not present

## 2018-05-14 DIAGNOSIS — Z880 Allergy status to penicillin: Secondary | ICD-10-CM

## 2018-05-14 DIAGNOSIS — Z7982 Long term (current) use of aspirin: Secondary | ICD-10-CM | POA: Diagnosis not present

## 2018-05-14 DIAGNOSIS — N184 Chronic kidney disease, stage 4 (severe): Secondary | ICD-10-CM | POA: Diagnosis present

## 2018-05-14 DIAGNOSIS — N2581 Secondary hyperparathyroidism of renal origin: Secondary | ICD-10-CM | POA: Diagnosis not present

## 2018-05-14 DIAGNOSIS — I129 Hypertensive chronic kidney disease with stage 1 through stage 4 chronic kidney disease, or unspecified chronic kidney disease: Secondary | ICD-10-CM | POA: Diagnosis not present

## 2018-05-14 DIAGNOSIS — D631 Anemia in chronic kidney disease: Secondary | ICD-10-CM | POA: Diagnosis not present

## 2018-05-14 DIAGNOSIS — N289 Disorder of kidney and ureter, unspecified: Secondary | ICD-10-CM

## 2018-05-14 DIAGNOSIS — T502X5A Adverse effect of carbonic-anhydrase inhibitors, benzothiadiazides and other diuretics, initial encounter: Secondary | ICD-10-CM | POA: Diagnosis present

## 2018-05-14 DIAGNOSIS — E7800 Pure hypercholesterolemia, unspecified: Secondary | ICD-10-CM | POA: Diagnosis present

## 2018-05-14 DIAGNOSIS — Z66 Do not resuscitate: Secondary | ICD-10-CM | POA: Diagnosis not present

## 2018-05-14 LAB — COMPREHENSIVE METABOLIC PANEL
ALBUMIN: 4 g/dL (ref 3.5–5.0)
ALT: 53 U/L — ABNORMAL HIGH (ref 0–44)
AST: 54 U/L — ABNORMAL HIGH (ref 15–41)
Alkaline Phosphatase: 89 U/L (ref 38–126)
Anion gap: 12 (ref 5–15)
BUN: 138 mg/dL — ABNORMAL HIGH (ref 8–23)
CO2: 22 mmol/L (ref 22–32)
Calcium: 9.6 mg/dL (ref 8.9–10.3)
Chloride: 101 mmol/L (ref 98–111)
Creatinine, Ser: 3.58 mg/dL — ABNORMAL HIGH (ref 0.44–1.00)
GFR calc Af Amer: 12 mL/min — ABNORMAL LOW (ref >60)
GFR calc non Af Amer: 11 mL/min — ABNORMAL LOW (ref >60)
GLUCOSE: 193 mg/dL — AB (ref 70–99)
Potassium: 4.5 mmol/L (ref 3.5–5.1)
Sodium: 135 mmol/L (ref 135–145)
Total Bilirubin: 0.9 mg/dL (ref 0.3–1.2)
Total Protein: 7.3 g/dL (ref 6.5–8.1)

## 2018-05-14 LAB — URINALYSIS, COMPLETE (UACMP) WITH MICROSCOPIC
BILIRUBIN URINE: NEGATIVE
Bacteria, UA: NONE SEEN
Glucose, UA: NEGATIVE mg/dL
Hgb urine dipstick: NEGATIVE
Ketones, ur: NEGATIVE mg/dL
Leukocytes, UA: NEGATIVE
Nitrite: NEGATIVE
PH: 5 (ref 5.0–8.0)
Protein, ur: NEGATIVE mg/dL
Specific Gravity, Urine: 1.006 (ref 1.005–1.030)

## 2018-05-14 LAB — CBC WITH DIFFERENTIAL/PLATELET
Abs Immature Granulocytes: 0.05 10*3/uL (ref 0.00–0.07)
Basophils Absolute: 0.1 10*3/uL (ref 0.0–0.1)
Basophils Relative: 1 %
Eosinophils Absolute: 1.5 10*3/uL — ABNORMAL HIGH (ref 0.0–0.5)
Eosinophils Relative: 16 %
HCT: 29.5 % — ABNORMAL LOW (ref 36.0–46.0)
Hemoglobin: 9.9 g/dL — ABNORMAL LOW (ref 12.0–15.0)
IMMATURE GRANULOCYTES: 1 %
Lymphocytes Relative: 17 %
Lymphs Abs: 1.6 10*3/uL (ref 0.7–4.0)
MCH: 31.9 pg (ref 26.0–34.0)
MCHC: 33.6 g/dL (ref 30.0–36.0)
MCV: 95.2 fL (ref 80.0–100.0)
MONOS PCT: 10 %
Monocytes Absolute: 0.9 10*3/uL (ref 0.1–1.0)
NEUTROS ABS: 5.1 10*3/uL (ref 1.7–7.7)
NRBC: 0 % (ref 0.0–0.2)
Neutrophils Relative %: 55 %
Platelets: 196 10*3/uL (ref 150–400)
RBC: 3.1 MIL/uL — ABNORMAL LOW (ref 3.87–5.11)
RDW: 13.5 % (ref 11.5–15.5)
WBC: 9.3 10*3/uL (ref 4.0–10.5)

## 2018-05-14 MED ORDER — ALLOPURINOL 100 MG PO TABS
300.0000 mg | ORAL_TABLET | Freq: Every day | ORAL | Status: DC
  Administered 2018-05-15 – 2018-05-17 (×2): 300 mg via ORAL
  Filled 2018-05-14 (×3): qty 3

## 2018-05-14 MED ORDER — PANTOPRAZOLE SODIUM 40 MG PO TBEC
40.0000 mg | DELAYED_RELEASE_TABLET | Freq: Two times a day (BID) | ORAL | Status: DC
  Administered 2018-05-14 – 2018-05-17 (×6): 40 mg via ORAL
  Filled 2018-05-14 (×6): qty 1

## 2018-05-14 MED ORDER — ACETAMINOPHEN 650 MG RE SUPP: 650.0000 mg | Freq: Four times a day (QID) | RECTAL | Status: DC | PRN

## 2018-05-14 MED ORDER — ASPIRIN EC 81 MG PO TBEC
81.0000 mg | DELAYED_RELEASE_TABLET | Freq: Every day | ORAL | Status: DC
  Administered 2018-05-15 – 2018-05-17 (×3): 81 mg via ORAL
  Filled 2018-05-14 (×3): qty 1

## 2018-05-14 MED ORDER — CLOPIDOGREL BISULFATE 75 MG PO TABS
75.0000 mg | ORAL_TABLET | Freq: Every day | ORAL | Status: DC
  Administered 2018-05-15 – 2018-05-17 (×3): 75 mg via ORAL
  Filled 2018-05-14 (×3): qty 1

## 2018-05-14 MED ORDER — ACETAMINOPHEN 325 MG PO TABS: 650.0000 mg | ORAL_TABLET | Freq: Four times a day (QID) | ORAL | Status: DC | PRN

## 2018-05-14 MED ORDER — METOPROLOL SUCCINATE ER 25 MG PO TB24
37.5000 mg | ORAL_TABLET | Freq: Every day | ORAL | Status: DC
  Administered 2018-05-14 – 2018-05-16 (×2): 37.5 mg via ORAL
  Filled 2018-05-14 (×3): qty 2

## 2018-05-14 MED ORDER — ONDANSETRON HCL 4 MG PO TABS: 4.0000 mg | ORAL_TABLET | Freq: Four times a day (QID) | ORAL | Status: DC | PRN

## 2018-05-14 MED ORDER — SODIUM CHLORIDE 0.9 % IV BOLUS
1000.0000 mL | Freq: Once | INTRAVENOUS | Status: AC
  Administered 2018-05-14: 1000 mL via INTRAVENOUS

## 2018-05-14 MED ORDER — SIMVASTATIN 20 MG PO TABS
20.0000 mg | ORAL_TABLET | Freq: Every day | ORAL | Status: DC
  Administered 2018-05-14 – 2018-05-17 (×4): 20 mg via ORAL
  Filled 2018-05-14 (×4): qty 1

## 2018-05-14 MED ORDER — SODIUM CHLORIDE 0.45 % IV SOLN
INTRAVENOUS | Status: AC
  Administered 2018-05-14 – 2018-05-15 (×2): via INTRAVENOUS

## 2018-05-14 MED ORDER — MECLIZINE HCL 25 MG PO TABS
25.0000 mg | ORAL_TABLET | Freq: Three times a day (TID) | ORAL | Status: DC | PRN
  Administered 2018-05-14: 25 mg via ORAL
  Filled 2018-05-14 (×2): qty 1

## 2018-05-14 MED ORDER — HYDRALAZINE HCL 50 MG PO TABS
50.0000 mg | ORAL_TABLET | Freq: Two times a day (BID) | ORAL | Status: DC
  Administered 2018-05-14 – 2018-05-17 (×5): 50 mg via ORAL
  Filled 2018-05-14 (×6): qty 1

## 2018-05-14 MED ORDER — HEPARIN SODIUM (PORCINE) 5000 UNIT/ML IJ SOLN
5000.0000 [IU] | Freq: Three times a day (TID) | INTRAMUSCULAR | Status: DC
Start: 2018-05-14 — End: 2018-05-17
  Administered 2018-05-14 – 2018-05-17 (×8): 5000 [IU] via SUBCUTANEOUS
  Filled 2018-05-14 (×8): qty 1

## 2018-05-14 MED ORDER — ALBUTEROL SULFATE (2.5 MG/3ML) 0.083% IN NEBU: 2.5000 mg | INHALATION_SOLUTION | RESPIRATORY_TRACT | Status: DC | PRN

## 2018-05-14 MED ORDER — ONDANSETRON HCL 4 MG/2ML IJ SOLN: 4.0000 mg | Freq: Four times a day (QID) | INTRAMUSCULAR | Status: DC | PRN

## 2018-05-14 MED ORDER — GABAPENTIN 300 MG PO CAPS
300.0000 mg | ORAL_CAPSULE | Freq: Every day | ORAL | Status: DC
  Administered 2018-05-14 – 2018-05-16 (×3): 300 mg via ORAL
  Filled 2018-05-14 (×3): qty 1

## 2018-05-14 MED ORDER — DOCUSATE SODIUM 100 MG PO CAPS
100.0000 mg | ORAL_CAPSULE | Freq: Two times a day (BID) | ORAL | Status: DC
  Administered 2018-05-14 – 2018-05-17 (×5): 100 mg via ORAL
  Filled 2018-05-14 (×6): qty 1

## 2018-05-14 MED ORDER — OLANZAPINE 2.5 MG PO TABS
2.5000 mg | ORAL_TABLET | Freq: Every day | ORAL | Status: DC
  Administered 2018-05-14 – 2018-05-16 (×3): 2.5 mg via ORAL
  Filled 2018-05-14 (×4): qty 1

## 2018-05-14 MED ORDER — CITALOPRAM HYDROBROMIDE 20 MG PO TABS
20.0000 mg | ORAL_TABLET | Freq: Every day | ORAL | Status: DC
  Administered 2018-05-15 – 2018-05-17 (×3): 20 mg via ORAL
  Filled 2018-05-14 (×3): qty 1

## 2018-05-14 MED ORDER — POLYETHYLENE GLYCOL 3350 17 G PO PACK: 17.0000 g | PACK | Freq: Every day | ORAL | Status: DC | PRN

## 2018-05-14 MED ORDER — AMLODIPINE BESYLATE 5 MG PO TABS
5.0000 mg | ORAL_TABLET | Freq: Every day | ORAL | Status: DC
Start: 2018-05-14 — End: 2018-05-17
  Administered 2018-05-15 – 2018-05-17 (×3): 5 mg via ORAL
  Filled 2018-05-14 (×3): qty 1

## 2018-05-14 NOTE — ED Notes (Addendum)
Pt states she feels lethargic. Pt still making urine but states the amount is less and not as frequently.

## 2018-05-14 NOTE — ED Triage Notes (Signed)
Pt sent by Dr. Rolly Salter for kidney function. She states she's been in and out of hospital recently for kidneys. States no dialysis. A&O, in wheelchair. Talking in complete sentences. Denies pain. States decrease in urination frequency.

## 2018-05-14 NOTE — H&P (Signed)
[facility] [place] NAME: Tina Curtis    MR#:  [id number]  DATE OF BIRTH:  October 29, 1926  DATE OF ADMISSION:  05/14/2018  PRIMARY CARE PHYSICIAN: Derinda Late, MD   REQUESTING/REFERRING PHYSICIAN: Dr. Kerman Passey  CHIEF COMPLAINT:   Chief Complaint  Patient presents with  . Abnormal Lab    HISTORY OF PRESENT ILLNESS:  Tina Curtis  is a 85 y.o. female with a known history of CKD 4, hypertension, arthritis presents to the hospital in the emergency room sent in by her nephrologist Dr. Juleen China due to worsening renal function and decreased urine output with lower extremity edema.  Patient has no shortness of breath but complains of abdominal distention with fluid along with lower extremity edema.  Significant weakness.  Patient seems to be nearing hemodialysis.  At this time plan is to try IV fluids to see if renal function would improve.  PAST MEDICAL HISTORY:   Past Medical History:  Diagnosis Date  . Arthritis    Gout  . Diarrhea   . Hypercholesteremia   . Hypertension   . Neuropathy    feet and lower legs  . Seasonal allergies   . Skin cancer    face    PAST SURGICAL HISTORY:   Past Surgical History:  Procedure Laterality Date  . ABDOMINAL HYSTERECTOMY  2000  . APPENDECTOMY    . CATARACT EXTRACTION Left   . CATARACT EXTRACTION W/PHACO Right 10/24/2014   Procedure: CATARACT EXTRACTION PHACO AND INTRAOCULAR LENS PLACEMENT (IOC);  Surgeon: Leandrew Koyanagi, MD;  Location: [facility];  Service: Ophthalmology;  Laterality: Right;  . ESOPHAGOGASTRODUODENOSCOPY N/A 03/07/2018   Procedure: ESOPHAGOGASTRODUODENOSCOPY (EGD);  Surgeon: Lin Landsman, MD;  Location[facility] ENDOSCOPY;  Service: Gastroenterology;  Laterality: N/A;  . RENAL ANGIOGRAPHY Right 04/11/2018   Procedure: RENAL ANGIOGRAPHY;  Surgeon: Algernon Huxley, MD;  Location: [place] [facility];  Service: Cardiovascular;  Laterality: Right;    SOCIAL HISTORY:    Social History   Tobacco Use  . Smoking status: Never Smoker  . Smokeless tobacco: Never Used  Substance Use Topics  . Alcohol use: No    FAMILY HISTORY:   Family History  Problem Relation Age of Onset  . Hypertension Mother   . Hypertension Father   . Stroke Father     DRUG ALLERGIES:   Allergies  Allergen Reactions  . Penicillins Anaphylaxis    REVIEW OF SYSTEMS:   Review of Systems  Constitutional: Positive for malaise/fatigue. Negative for chills and fever.  HENT: Negative for sore throat.   Eyes: Negative for blurred vision, double vision and pain.  Respiratory: Negative for cough, hemoptysis, shortness of breath and wheezing.   Cardiovascular: Positive for leg swelling. Negative for chest pain, palpitations and orthopnea.  Gastrointestinal: Negative for abdominal pain, constipation, diarrhea, heartburn, nausea and vomiting.  Genitourinary: Negative for dysuria and hematuria.  Musculoskeletal: Negative for back pain and joint pain.  Skin: Negative for rash.  Neurological: Negative for sensory change, speech change, focal weakness and headaches.  Endo/Heme/Allergies: Does not bruise/bleed easily.  Psychiatric/Behavioral: Negative for depression. The patient is not nervous/anxious.     MEDICATIONS AT HOME:   Prior to Admission medications   Medication Sig Start Date End Date Taking? Authorizing Provider  torsemide (DEMADEX) 20 MG tablet Take 20 mg by mouth daily. 04/14/18 04/14/19 Yes [provider]  acetaminophen (TYLENOL) 500 MG tablet Take 500 mg by mouth every 6 (six) hours as needed.  [provider]  allopurinol (ZYLOPRIM) 100 MG tablet  02/07/18   [provider]  allopurinol (ZYLOPRIM) 300 MG tablet Take 300 mg by mouth daily. AM    [provider]  amLODipine (NORVASC) 5 MG tablet  03/29/18   [provider]  aspirin EC 81 MG tablet Take 81 mg by mouth daily.    [provider]  Cholecalciferol  (VITAMIN D PO) Take by mouth.    [provider]  citalopram (CELEXA) 20 MG tablet Take by mouth. 03/14/18   [provider]  clopidogrel (PLAVIX) 75 MG tablet Take 75 mg by mouth daily.    [provider]  colchicine 0.6 MG tablet Take 0.6 mg by mouth daily as needed.    [provider]  docusate sodium (COLACE) 100 MG capsule Take 1 capsule (100 mg total) by mouth 2 (two) times daily. 03/08/18 03/08/19  Salary, Avel Peace, MD  feeding supplement, GLUCERNA SHAKE, (GLUCERNA SHAKE) LIQD Take 237 mLs by mouth 3 (three) times daily between meals. 02/22/18   Epifanio Lesches, MD  furosemide (LASIX) 20 MG tablet 2 PO daily x 1 week then 1 PO daily thereafter 03/29/18   [provider]  gabapentin (NEURONTIN) 100 MG capsule Take by mouth. 01/09/15   [provider]  gabapentin (NEURONTIN) 300 MG capsule Take 300 mg by mouth at bedtime.     [provider]  hydrALAZINE (APRESOLINE) 50 MG tablet Take 1 tablet (50 mg total) by mouth 2 (two) times daily. 02/22/18   Epifanio Lesches, MD  HYDROcodone-acetaminophen (NORCO/VICODIN) 5-325 MG tablet Take by mouth. 06/22/16   [provider]  loratadine (CLARITIN) 10 MG tablet Take 10 mg by mouth daily as needed for allergies. AM    [provider]  losartan (COZAAR) 100 MG tablet Take 1 tablet (100 mg total) by mouth daily. 03/09/18   Salary, Avel Peace, MD  losartan (COZAAR) 50 MG tablet Take by mouth. 09/13/14   [provider]  Meclizine HCl 25 MG CHEW Chew 1 tablet (25 mg total) by mouth 3 (three) times daily as needed. 03/08/18   Salary, Avel Peace, MD  metolazone (ZAROXOLYN) 5 MG tablet Take 5 mg by mouth daily. 04/06/18   [provider]  metoprolol succinate (TOPROL-XL) 25 MG 24 hr tablet Take 37.5 mg by mouth at bedtime.     [provider]  Multiple Vitamins-Minerals (ICAPS PO) Take by mouth daily.    [provider]  OLANZapine (ZYPREXA)  2.5 MG tablet Take 1 tablet (2.5 mg total) by mouth at bedtime. 03/08/18   Salary, Avel Peace, MD  ondansetron (ZOFRAN-ODT) 4 MG disintegrating tablet Take 4 mg by mouth every 8 (eight) hours as needed for nausea or vomiting.    [provider]  pantoprazole (PROTONIX) 40 MG tablet Take 1 tablet (40 mg total) by mouth 2 (two) times daily before a meal. 03/07/18   Salary, Holly Bodily D, MD  polyethylene glycol (MIRALAX / GLYCOLAX) packet Take 17 g by mouth daily as needed. Patient not taking: Reported on 04/11/2018 03/08/18   Salary, Holly Bodily D, MD  prednisoLONE acetate (PRED FORTE) 1 % ophthalmic suspension INSTILL 1 DROP IN RIGHT EYE 2 TIMES A DAY 11/30/14   [provider]  Probiotic Product (ALIGN PO) Take by mouth. AM    [provider]  promethazine (PHENERGAN) 6.25 MG/5ML syrup Take 5 mLs (6.25 mg total) by mouth every 6 (six) hours as needed for refractory nausea / vomiting. Patient not taking:  Reported on 03/23/2018 03/08/18   Salary, Avel Peace, MD  scopolamine (TRANSDERM-SCOP) 1 MG/3DAYS PLACE 1 PATCH ONTO THE SKIN EVERY 3RD DAY 02/14/18   [provider]  simvastatin (ZOCOR) 20 MG tablet Take 20 mg by mouth daily. PM    [provider]  traZODone (DESYREL) 50 MG tablet Take 0.5 tablets (25 mg total) by mouth at bedtime. Patient not taking: Reported on 04/11/2018 02/22/18   Epifanio Lesches, MD  Vitamin D, Ergocalciferol, (DRISDOL) 1.25 MG (50000 UT) CAPS capsule Take by mouth. 01/09/15   [provider]     VITAL SIGNS:  Blood pressure (!) 142/66, pulse 61, temperature 98 F (36.7 C), temperature source Oral, resp. rate 14, height 5\' 5"  (1.651 m), weight 71.9 kg, SpO2 94 %.  PHYSICAL EXAMINATION:  Physical Exam  GENERAL:  85 y.o.-year-old patient lying in the bed with no acute distress.  EYES: Pupils equal, round, reactive to light and accommodation. No scleral icterus. Extraocular muscles intact.  HEENT: Head atraumatic,  normocephalic. Oropharynx and nasopharynx clear. No oropharyngeal erythema, moist oral mucosa  NECK:  Supple, no jugular venous distention. No thyroid enlargement, no tenderness.  LUNGS: Normal breath sounds bilaterally, no wheezing, rales, rhonchi. No use of accessory muscles of respiration.  CARDIOVASCULAR: S1, S2 normal. No murmurs, rubs, or gallops.  ABDOMEN: Soft, nontender, nondistended. Bowel sounds present. No organomegaly or mass.  EXTREMITIES: No cyanosis, or clubbing. + 2 pedal & radial pulses b/l.  Bilateral lower extremity edema NEUROLOGIC: Cranial nerves II through XII are intact. No focal Motor or sensory deficits appreciated b/l PSYCHIATRIC: The patient is alert and oriented x 3. Good affect.  SKIN: No obvious rash, lesion, or ulcer.   LABORATORY PANEL:   CBC Recent Labs  Lab 05/14/18 1052  WBC 9.3  HGB 9.9*  HCT 29.5*  PLT 196   ------------------------------------------------------------------------------------------------------------------  Chemistries  Recent Labs  Lab 05/14/18 1052  NA 135  K 4.5  CL 101  CO2 22  GLUCOSE 193*  BUN 138*  CREATININE 3.58*  CALCIUM 9.6  AST 54*  ALT 53*  ALKPHOS 89  BILITOT 0.9   ------------------------------------------------------------------------------------------------------------------  Cardiac Enzymes No results for input(s): TROPONINI in the last 168 hours. ------------------------------------------------------------------------------------------------------------------  RADIOLOGY:  No results found.   IMPRESSION AND PLAN:   *Acute kidney injury over CKD stage III Discussed with Dr. Juleen China of nephrology.  Will start IV fluids.  Repeat renal function in the morning.  Monitor input and output. Hopefully patient will improve.  If not patient will need hemodialysis after getting vascular access. Hold nephrotoxic medications  *Hypertension.  Continue medications except losartan  *Anemia of chronic  disease is stable  *DVT prophylaxis with heparin subcutaneous  All the records are reviewed and case discussed with ED provider. Management plans discussed with the patient, family and they are in agreement.  CODE STATUS: DO NOT RESUSCITATE  TOTAL TIME TAKING CARE OF THIS PATIENT: 40 minutes.   Leia Alf Tiziana Cislo M.D on 05/14/2018 at 2:41 PM  Between 7am to 6pm - Pager - [phone]  After 6pm go to [email or web address] - password [email or web address] [facility]  Office  [phone]  CC: Primary care physician; Derinda Late, MD  Note: This dictation was prepared with Dragon dictation along with smaller phrase technology. Any transcriptional errors that result from this process are unintentional.

## 2018-05-14 NOTE — ED Provider Notes (Signed)
[facility] Emergency Department Provider Note  Time seen: 2:34 PM  I have reviewed the triage vital signs and the nursing notes.   HISTORY  Chief Complaint Abnormal Lab    HPI Tina Curtis is a 85 y.o. female with a past medical history of diarrhea, hypertension, hyperlipidemia, gout, presents to the emergency department for generalized fatigue and weakness sent by her nephrologist.  According to the patient for the past 2 months she has been feeling very weak, nauseated at times of vomiting and has developed shortness of breath of the past several days.  Patient was seen by nephrology and sent to the emergency department for very high BUN and creatinine.  Patient states a history of chronic kidney disease, they have been watching but she has not required dialysis as of yet.  Patient denies any current nausea.  Denies any shortness of breath at rest but states she will become short of breath with exertion.  Largely negative review of systems otherwise.   Past Medical History:  Diagnosis Date  . Arthritis    Gout  . Diarrhea   . Hypercholesteremia   . Hypertension   . Neuropathy    feet and lower legs  . Seasonal allergies   . Skin cancer    face    Patient Active Problem List   Diagnosis Date Noted  . Chronic kidney disease (CKD), stage IV (severe) ([place]) 04/01/2018  . 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 Renal artery stenosis ([place]) 04/01/2018  . Nausea & vomiting 03/05/2018  . AKI (acute kidney injury) ([place]) 02/17/2018  . CKD (chronic kidney disease) stage 3, GFR 30-59 ml/min ([facility]) 03/26/2015  . Acquired hammer toe of right foot 08/14/2014  . Bunion of right foot 08/14/2014  . Chronic gout without tophus 08/14/2014  . History of Clostridium difficile colitis 08/14/2014  . Pure hypercholesterolemia 08/14/2014  . RLS (restless legs syndrome) 08/14/2014  . Fatigue 09/30/2012  . Gluteus medius or minimus syndrome 08/23/2012  . Trochanteric  bursitis 08/12/2012  . Benign essential hypertension 09/03/2011  . Diverticulosis of colon 09/03/2011  . Osteopenia 09/03/2011  . Enthesopathy of ankle and tarsus 04/13/2011  . Osteoarthritis of spine with radiculopathy, lumbosacral region 09/30/2010  . Allergic rhinitis 10/31/1928    Past Surgical History:  Procedure Laterality Date  . ABDOMINAL HYSTERECTOMY  2000  . APPENDECTOMY    . CATARACT EXTRACTION Left   . CATARACT EXTRACTION W/PHACO Right 10/24/2014   Procedure: CATARACT EXTRACTION PHACO AND INTRAOCULAR LENS PLACEMENT (IOC);  Surgeon: Leandrew Koyanagi, MD;  Location: [facility];  Service: Ophthalmology;  Laterality: Right;  . ESOPHAGOGASTRODUODENOSCOPY N/A 03/07/2018   Procedure: ESOPHAGOGASTRODUODENOSCOPY (EGD);  Surgeon: Lin Landsman, MD;  Location: [facility] ENDOSCOPY;  Service: Gastroenterology;  Laterality: N/A;  . RENAL ANGIOGRAPHY Right 04/11/2018   Procedure: RENAL ANGIOGRAPHY;  Surgeon: Algernon Huxley, MD;  Location: [facility];  Service: Cardiovascular;  Laterality: Right;    Prior to Admission medications   Medication Sig Start Date End Date Taking? Authorizing Provider  torsemide (DEMADEX) 20 MG tablet Take 20 mg by mouth daily. 04/14/18 04/14/19 Yes [provider]  acetaminophen (TYLENOL) 500 MG tablet Take 500 mg by mouth every 6 (six) hours as needed.    [provider]  allopurinol (ZYLOPRIM) 100 MG tablet  02/07/18   [provider]  allopurinol (ZYLOPRIM) 300 MG tablet Take 300 mg by mouth daily. AM    [provider]  amLODipine (NORVASC) 5 MG tablet  03/29/18  [provider]  aspirin EC 81 MG tablet Take 81 mg by mouth daily.    [provider]  Cholecalciferol (VITAMIN D PO) Take by mouth.    [provider]  citalopram (CELEXA) 20 MG tablet Take by mouth. 03/14/18   [provider]  clopidogrel (PLAVIX) 75 MG tablet Take 75 mg by mouth daily.    [provider]  colchicine 0.6 MG tablet Take 0.6 mg by mouth daily as needed.    [provider]  docusate sodium (COLACE) 100 MG capsule Take 1 capsule (100 mg total) by mouth 2 (two) times daily. 03/08/18 03/08/19  Salary, Avel Peace, MD  feeding supplement, GLUCERNA SHAKE, (GLUCERNA SHAKE) LIQD Take 237 mLs by mouth 3 (three) times daily between meals. 02/22/18   Epifanio Lesches, MD  furosemide (LASIX) 20 MG tablet 2 PO daily x 1 week then 1 PO daily thereafter 03/29/18   [provider]  gabapentin (NEURONTIN) 100 MG capsule Take by mouth. 01/09/15   [provider]  gabapentin (NEURONTIN) 300 MG capsule Take 300 mg by mouth at bedtime.     [provider]  hydrALAZINE (APRESOLINE) 50 MG tablet Take 1 tablet (50 mg total) by mouth 2 (two) times daily. 02/22/18   Epifanio Lesches, MD  HYDROcodone-acetaminophen (NORCO/VICODIN) 5-325 MG tablet Take by mouth. 06/22/16   [provider]  loratadine (CLARITIN) 10 MG tablet Take 10 mg by mouth daily as needed for allergies. AM    [provider]  losartan (COZAAR) 100 MG tablet Take 1 tablet (100 mg total) by mouth daily. 03/09/18   Salary, Avel Peace, MD  losartan (COZAAR) 50 MG tablet Take by mouth. 09/13/14   [provider]  Meclizine HCl 25 MG CHEW Chew 1 tablet (25 mg total) by mouth 3 (three) times daily as needed. 03/08/18   Salary, Avel Peace, MD  metolazone (ZAROXOLYN) 5 MG tablet Take 5 mg by mouth daily. 04/06/18   [provider]  metoprolol succinate (TOPROL-XL) 25 MG 24 hr tablet Take 37.5 mg by mouth at bedtime.     [provider]  Multiple Vitamins-Minerals (ICAPS PO) Take by mouth daily.    [provider]  OLANZapine (ZYPREXA) 2.5 MG tablet Take 1 tablet (2.5 mg total) by mouth at bedtime. 03/08/18   Salary, Avel Peace, MD  ondansetron (ZOFRAN-ODT) 4 MG disintegrating tablet Take 4 mg by mouth every 8 (eight) hours as needed for nausea or  vomiting.    [provider]  pantoprazole (PROTONIX) 40 MG tablet Take 1 tablet (40 mg total) by mouth 2 (two) times daily before a meal. 03/07/18   Salary, Holly Bodily D, MD  polyethylene glycol (MIRALAX / GLYCOLAX) packet Take 17 g by mouth daily as needed. Patient not taking: Reported on 04/11/2018 03/08/18   Salary, Holly Bodily D, MD  prednisoLONE acetate (PRED FORTE) 1 % ophthalmic suspension INSTILL 1 DROP IN RIGHT EYE 2 TIMES A DAY 11/30/14   [provider]  Probiotic Product (ALIGN PO) Take by mouth. AM    [provider]  promethazine (PHENERGAN) 6.25 MG/5ML syrup Take 5 mLs (6.25 mg total) by mouth every 6 (six) hours as needed for refractory nausea / vomiting. Patient not taking: Reported on 03/23/2018 03/08/18   Salary, Avel Peace, MD  scopolamine (TRANSDERM-SCOP) 1 MG/3DAYS PLACE 1 PATCH ONTO THE SKIN EVERY 3RD DAY 02/14/18   [provider]  simvastatin (ZOCOR) 20 MG tablet Take 20 mg by mouth daily. PM  [provider]  traZODone (DESYREL) 50 MG tablet Take 0.5 tablets (25 mg total) by mouth at bedtime. Patient not taking: Reported on 04/11/2018 02/22/18   Epifanio Lesches, MD  Vitamin D, Ergocalciferol, (DRISDOL) 1.25 MG (50000 UT) CAPS capsule Take by mouth. 01/09/15   [provider]    Allergies  Allergen Reactions  . Penicillins Anaphylaxis    Family History  Problem Relation Age of Onset  . Hypertension Mother   . Hypertension Father   . Stroke Father     Social History Social History   Tobacco Use  . Smoking status: Never Smoker  . Smokeless tobacco: Never Used  Substance Use Topics  . Alcohol use: No  . Drug use: Never    Review of Systems Constitutional: Negative for fever. Cardiovascular: Negative for chest pain. Respiratory: Shortness of breath with exertion. Gastrointestinal: Negative for abdominal pain.  Positive for nausea.  Negative for diarrhea. Genitourinary: Has noticed decreased urine  output. Musculoskeletal: No lower extremity edema. Skin: Negative for skin complaints  Neurological: Negative for headache All other ROS negative  ____________________________________________   PHYSICAL EXAM:  VITAL SIGNS: ED Triage Vitals  Enc Vitals Group     BP 05/14/18 1047 (!) 126/52     Pulse Rate 05/14/18 1047 65     Resp 05/14/18 1047 16     Temp 05/14/18 1047 98 F (36.7 C)     Temp Source 05/14/18 1047 Oral     SpO2 05/14/18 1047 95 %     Weight 05/14/18 1048 158 lb 8 oz (71.9 kg)     Height 05/14/18 1048 5\' 5"  (1.651 m)     Head Circumference --      Peak Flow --      Pain Score 05/14/18 1048 0     Pain Loc --      Pain Edu? --      Excl. in [place]? --    Constitutional: Alert and oriented. Well appearing and in no distress. Eyes: Normal exam ENT   Head: Normocephalic and atraumatic   Mouth/Throat: Mucous membranes are moist. Cardiovascular: Normal rate, regular rhythm. Respiratory: Normal respiratory effort without tachypnea nor retractions. Breath sounds are clear.  No rales. Gastrointestinal: Soft and nontender. No distention.  Musculoskeletal: Nontender with normal range of motion in all extremities. No lower extremity tenderness or edema. Neurologic:  Normal speech and language. No gross focal neurologic deficits  Skin:  Skin is warm, dry and intact.  Psychiatric: Mood and affect are normal.   ____________________________________________   INITIAL IMPRESSION / ASSESSMENT AND PLAN / ED COURSE  Pertinent labs & imaging results that were available during my care of the patient were reviewed by me and considered in my medical decision making (see chart for details).  Patient presents to the emergency department for decreased urine output, found to have abnormal lab values sent by nephrology.  Patient states she has been feeling very weak over the past 2 months.  Today patient's BUN is extremely elevated as well as her creatinine over double of  baseline.  Patient's nephrologist Dr. Juleen China has seen the patient in the emergency department, we will admit to the hospitalist service and nephrology will consult.  ____________________________________________   FINAL CLINICAL IMPRESSION(S) / ED DIAGNOSES  Generalized weakness Acute renal insufficiency   Harvest Dark, MD 05/14/18 1437

## 2018-05-14 NOTE — Progress Notes (Signed)
Advance care planning  Purpose of Encounter acute kidney injury over CKD stage III  Parties in Attendance Patient  Patients Decisional capacity Patient is alert and oriented.  Able to make medical decisions.  Has documented healthcare power of attorney her daughter-[name] Murrill.  Discussed regarding acute kidney injury and CKD.  Need for IV fluids, admission and repeat labs.  Patient in agreement to start hemodialysis if needed.  Likely in the near future.  CODE STATUS discussed and patient is DO NOT RESUSCITATE and DO NOT INTUBATE  CODE STATUS changed after entering orders.  Time spent -17-minute

## 2018-05-15 DIAGNOSIS — N179 Acute kidney failure, unspecified: Secondary | ICD-10-CM | POA: Diagnosis not present

## 2018-05-15 LAB — CBC
HCT: 28 % — ABNORMAL LOW (ref 36.0–46.0)
Hemoglobin: 9.2 g/dL — ABNORMAL LOW (ref 12.0–15.0)
MCH: 31.2 pg (ref 26.0–34.0)
MCHC: 32.9 g/dL (ref 30.0–36.0)
MCV: 94.9 fL (ref 80.0–100.0)
Platelets: 187 10*3/uL (ref 150–400)
RBC: 2.95 MIL/uL — ABNORMAL LOW (ref 3.87–5.11)
RDW: 13.5 % (ref 11.5–15.5)
WBC: 8.2 10*3/uL (ref 4.0–10.5)
nRBC: 0 % (ref 0.0–0.2)

## 2018-05-15 LAB — BASIC METABOLIC PANEL
ANION GAP: 13 (ref 5–15)
BUN: 127 mg/dL — ABNORMAL HIGH (ref 8–23)
CALCIUM: 9.3 mg/dL (ref 8.9–10.3)
CO2: 23 mmol/L (ref 22–32)
Chloride: 100 mmol/L (ref 98–111)
Creatinine, Ser: 3.52 mg/dL — ABNORMAL HIGH (ref 0.44–1.00)
GFR calc Af Amer: 13 mL/min — ABNORMAL LOW (ref >60)
GFR calc non Af Amer: 11 mL/min — ABNORMAL LOW (ref >60)
Glucose, Bld: 95 mg/dL (ref 70–99)
Potassium: 4.1 mmol/L (ref 3.5–5.1)
Sodium: 136 mmol/L (ref 135–145)

## 2018-05-15 NOTE — Progress Notes (Signed)
[facility]  ROUNDING NOTE   Subjective:   Tina Curtis was admitted to [facility] on 05/14/2018 for Acute renal insufficiency [N28.9]  Patient had been taking more of her torsemide due to increasing peripheral edema. Patient states the torsemide was not effective.   Patient started to have more fatigue, tired, poor appetite, nausea and dysguesia. Patient was asked to get labs done at my office. Where BUN was found to be 132. Patient was asked to come to the ED for further work up.   Patient started on IV fluids overnight. UOP of 1650. BUN improved to 127. Patient states she is starting to feel better.   Objective:  Vital signs in last 24 hours:  Temp:  [97.6 F (36.4 C)-98.5 F (36.9 C)] 98.5 F (36.9 C) (01/05 1221) Pulse Rate:  [59-67] 63 (01/05 1221) Resp:  [8-20] 20 (01/05 1221) BP: (108-148)/(46-75) 118/61 (01/05 1221) SpO2:  [92 %-100 %] 100 % (01/05 1221) Weight:  [69.5 kg] 69.5 kg (01/05 0500)  Weight change:  Filed Weights   05/14/18 1048 05/15/18 0500  Weight: 71.9 kg 69.5 kg    Intake/Output: I/O last 3 completed shifts: In: 1695.6 [I.V.:695.6; IV Piggyback:1000] Out: 1950 [Urine:1950]   Intake/Output this shift:  Total I/O In: -  Out: 350 [Urine:350]  Physical Exam: General: NAD,   Head: Normocephalic, atraumatic. Moist oral mucosal membranes  Eyes: Anicteric, PERRL  Neck: Supple, trachea midline  Lungs:  Clear to auscultation  Heart: Regular rate and rhythm  Abdomen:  Soft, nontender,   Extremities:  no peripheral edema.  Neurologic: Nonfocal, moving all four extremities  Skin: No lesions  Access: none    Basic Metabolic Panel: Recent Labs  Lab 05/14/18 1052 05/15/18 0423  NA 135 136  K 4.5 4.1  CL 101 100  CO2 22 23  GLUCOSE 193* 95  BUN 138* 127*  CREATININE 3.58* 3.52*  CALCIUM 9.6 9.3    Liver Function Tests: Recent Labs  Lab 05/14/18 1052  AST 54*  ALT 53*  ALKPHOS 89  BILITOT 0.9  PROT 7.3  ALBUMIN 4.0    No results for input(s): LIPASE, AMYLASE in the last 168 hours. No results for input(s): AMMONIA in the last 168 hours.  CBC: Recent Labs  Lab 05/14/18 1052 05/15/18 0423  WBC 9.3 8.2  NEUTROABS 5.1  --   HGB 9.9* 9.2*  HCT 29.5* 28.0*  MCV 95.2 94.9  PLT 196 187    Cardiac Enzymes: No results for input(s): CKTOTAL, CKMB, CKMBINDEX, TROPONINI in the last 168 hours.  BNP: Invalid input(s): POCBNP  CBG: No results for input(s): GLUCAP in the last 168 hours.  Microbiology: Results for orders placed or performed during the hospital encounter of 03/05/18  MRSA PCR Screening     Status: None   Collection Time: 03/05/18  5:05 AM  Result Value Ref Range Status   MRSA by PCR NEGATIVE NEGATIVE Final    Comment:        The GeneXpert MRSA Assay (FDA approved for NASAL specimens only), is one component of a comprehensive MRSA colonization surveillance program. It is not intended to diagnose MRSA infection nor to guide or monitor treatment for MRSA infections. Performed at [facility], [address] [place], [place] [address]     Coagulation Studies: No results for input(s): LABPROT, INR in the last 72 hours.  Urinalysis: Recent Labs    05/14/18 1052  COLORURINE STRAW*  LABSPEC 1.006  PHURINE 5.0  GLUCOSEU NEGATIVE  HGBUR NEGATIVE  BILIRUBINUR NEGATIVE  KETONESUR NEGATIVE  PROTEINUR NEGATIVE  NITRITE NEGATIVE  LEUKOCYTESUR NEGATIVE      Imaging: No results found.   Medications:   . sodium chloride 75 mL/hr at 05/15/18 0547   . allopurinol  300 mg Oral Daily  . amLODipine  5 mg Oral Daily  . aspirin EC  81 mg Oral Daily  . citalopram  20 mg Oral Daily  . clopidogrel  75 mg Oral Daily  . docusate sodium  100 mg Oral BID  . gabapentin  300 mg Oral QHS  . heparin  5,000 Units Subcutaneous Q8H  . hydrALAZINE  50 mg Oral BID  . metoprolol succinate  37.5 mg Oral QHS  . OLANZapine  2.5 mg Oral QHS  . pantoprazole  40 mg Oral BID AC  .  simvastatin  20 mg Oral Daily   acetaminophen **OR** acetaminophen, albuterol, meclizine, ondansetron **OR** ondansetron (ZOFRAN) IV, polyethylene glycol  Assessment/ Plan:  Tina Curtis is a 85 y.o. white female with hypertension, depression, gout, hyperlipidemia, who presents for acute renal failure with metabolic acidosis and hyponatremia  1. Acute renal failure on chronic kidney disease stage IV:  Baseline creatinine of 2, GFR of 24 on 02/08/18. However most recent creatinine from my office was 3.09, GFR of 13 on 04/29/18.  Acute renal failure from overdiuresis. There may be some progression of disease.  Status post right renal artery stent on 12/2 by Dr. Lucky Cowboy.  - Holding losartan - Discussed dialysis with patient. Patient is willing to do dialysis if necessary. At this time, no indication for dialysis. Patient is scheduled for vein mapping next week at [facility].   2. Hypertension: Blood pressure at goal  - amlodipine and hydralazine.  - Holding losartan and torsemide.   3. Secondary Hyperparathyroidism: PTH of 189 pm 05/12/18. Calcium and phosphorus at goal.  4. Anemia of chronic kidney disease: hemoglobin 9.2  Discussed the role of iron and epo with patient.    LOS: 1 [name] 1/5/20201:52 PM

## 2018-05-15 NOTE — Progress Notes (Signed)
[facility] [place] NAME: Tina Curtis    MR#:  [id number]  DATE OF BIRTH:  [birth year]  SUBJECTIVE:  CHIEF COMPLAINT:   Chief Complaint  Patient presents with  . Abnormal Lab   Still feels weak 1950 ml UOP since admission  REVIEW OF SYSTEMS:    Review of Systems  Constitutional: Positive for malaise/fatigue. Negative for chills and fever.  HENT: Negative for sore throat.   Eyes: Negative for blurred vision, double vision and pain.  Respiratory: Negative for cough, hemoptysis, shortness of breath and wheezing.   Cardiovascular: Negative for chest pain, palpitations, orthopnea and leg swelling.  Gastrointestinal: Negative for abdominal pain, constipation, diarrhea, heartburn, nausea and vomiting.  Genitourinary: Negative for dysuria and hematuria.  Musculoskeletal: Negative for back pain and joint pain.  Skin: Negative for rash.  Neurological: Negative for sensory change, speech change, focal weakness and headaches.  Endo/Heme/Allergies: Does not bruise/bleed easily.  Psychiatric/Behavioral: Negative for depression. The patient is not nervous/anxious.     DRUG ALLERGIES:   Allergies  Allergen Reactions  . Penicillins Anaphylaxis    VITALS:  Blood pressure 118/61, pulse 63, temperature 98.5 F (36.9 C), temperature source Oral, resp. rate 20, height 5\' 5"  (1.651 m), weight 69.5 kg, SpO2 100 %.  PHYSICAL EXAMINATION:   Physical Exam  GENERAL:  85 y.o.-year-old patient lying in the bed with no acute distress.  EYES: Pupils equal, round, reactive to light and accommodation. No scleral icterus. Extraocular muscles intact.  HEENT: Head atraumatic, normocephalic. Oropharynx and nasopharynx clear.  NECK:  Supple, no jugular venous distention. No thyroid enlargement, no tenderness.  LUNGS: Normal breath sounds bilaterally, no wheezing, rales, rhonchi. No use of accessory muscles of respiration.  CARDIOVASCULAR: S1, S2 normal. No  murmurs, rubs, or gallops.  ABDOMEN: Soft, nontender, nondistended. Bowel sounds present. No organomegaly or mass.  EXTREMITIES: No cyanosis, clubbing or edema b/l.    NEUROLOGIC: Cranial nerves II through XII are intact. No focal Motor or sensory deficits b/l.   PSYCHIATRIC: The patient is alert and oriented x 3.  SKIN: No obvious rash, lesion, or ulcer.   LABORATORY PANEL:   CBC Recent Labs  Lab 05/15/18 0423  WBC 8.2  HGB 9.2*  HCT 28.0*  PLT 187   ------------------------------------------------------------------------------------------------------------------ Chemistries  Recent Labs  Lab 05/14/18 1052 05/15/18 0423  NA 135 136  K 4.5 4.1  CL 101 100  CO2 22 23  GLUCOSE 193* 95  BUN 138* 127*  CREATININE 3.58* 3.52*  CALCIUM 9.6 9.3  AST 54*  --   ALT 53*  --   ALKPHOS 89  --   BILITOT 0.9  --    ------------------------------------------------------------------------------------------------------------------  Cardiac Enzymes No results for input(s): TROPONINI in the last 168 hours. ------------------------------------------------------------------------------------------------------------------  RADIOLOGY:  No results found.   ASSESSMENT AND PLAN:    *Acute kidney injury over CKD stage III Discussed with Dr. Juleen China of nephrology.  Continue  IV fluids.  Repeat renal function ordered for tomorrow AM  Monitor input and output. Hopefully patient will improve.  If not patient will need hemodialysis after getting vascular access. Hold nephrotoxic medications  *Hypertension.  Continue medications except losartan  *Anemia of chronic disease is stable  *DVT prophylaxis with heparin subcutaneous  All the records are reviewed and case discussed with Care Management/Social Workerr. Management plans discussed with the patient, family and they are in agreement.  CODE STATUS: DNR  DVT Prophylaxis: SCDs  TOTAL TIME TAKING CARE OF THIS  PATIENT: 35  minutes.   POSSIBLE D/C IN 1-2 DAYS, DEPENDING ON CLINICAL CONDITION.  Leia Alf Eiley Mcginnity M.D on 05/15/2018 at 2:23 PM  Between 7am to 6pm - Pager - [phone]  After 6pm go to [email or web address] - password [email or web address] [facility]  Office  [phone]  CC: Primary care physician; Derinda Late, MD  Note: This dictation was prepared with Dragon dictation along with smaller phrase technology. Any transcriptional errors that result from this process are unintentional.

## 2018-05-15 NOTE — Evaluation (Signed)
Physical Therapy Evaluation Patient Details Name: Tina Curtis MRN: [id number] [name]: May 13, 1926 Today's Date: 05/15/2018   History of Present Illness  Patient is a pleasant 85 y/o who presents with worsening renal function and decreased urine output and LE edema.   Clinical Impression  Patient was admitted after an outpatient nephrologist visit, and had been living in independent living facility prior this admission. She was able to ambulate around the RN station with no decrease in SpO2, though does endorse mild shortness of breath. She was able to transfer with minimal to no cuing or physical assistance and demonstrates minimal to no balance deficits this date. She would likely benefit from HHPT to address ongoing bil knee pain with transfers and decrease in stamina with mobility.     Follow Up Recommendations Home health PT    Equipment Recommendations  Rolling walker with 5" wheels    Recommendations for Other Services       Precautions / Restrictions Precautions Precautions: Fall Restrictions Weight Bearing Restrictions: No      Mobility  Bed Mobility Overal bed mobility: Needs Assistance Bed Mobility: Supine to Sit     Supine to sit: Supervision     General bed mobility comments: Patient requires cuing for technique to use bedrails for assistance.   Transfers Overall transfer level: Needs assistance Equipment used: Rolling walker (2 wheeled) Transfers: Sit to/from Stand Sit to Stand: Supervision         General transfer comment: Patient is able to complete transfer with minimal cuing or assistance.   Ambulation/Gait Ambulation/Gait assistance: Modified independent (Device/Increase time) Gait Distance (Feet): 200 Feet Assistive device: Rolling walker (2 wheeled) Gait Pattern/deviations: WFL(Within Functional Limits)     General Gait Details: No obvious deficits with mobility.   Stairs            Wheelchair Mobility    Modified Rankin (Stroke  Patients Only)       Balance Overall balance assessment: Modified Independent                                           Pertinent Vitals/Pain Pain Assessment: No/denies pain    Home Living Family/patient expects to be discharged to:: Private residence Living Arrangements: Alone Available Help at Discharge: Neighbor;Friend(s);Available PRN/intermittently Type of Home: Independent living facility Home Access: Level entry     Home Layout: One level Home Equipment: Walker - 4 wheels;Grab bars - tub/shower;Shower seat      Prior Function Level of Independence: Independent         Comments: Patient reports she is independent at baseline for ADLs and IADLs.      Hand Dominance   Dominant Hand: Right    Extremity/Trunk Assessment   Upper Extremity Assessment Upper Extremity Assessment: Overall WFL for tasks assessed    Lower Extremity Assessment Lower Extremity Assessment: Overall WFL for tasks assessed       Communication   Communication: HOH  Cognition Arousal/Alertness: Awake/alert Behavior During Therapy: WFL for tasks assessed/performed Overall Cognitive Status: Within Functional Limits for tasks assessed                                        General Comments      Exercises Other Exercises Other Exercises: Patient was able to perform x 3 sit  to stands before her knees became painful.  Other Exercises: Patient ambulated additional 200' with RW with no shortness of breath subjectively or objectively on SpO2 monitor.    Assessment/Plan    PT Assessment Patient needs continued PT services  PT Problem List Decreased strength;Decreased balance       PT Treatment Interventions DME instruction;Functional mobility training;Gait training;Therapeutic activities;Therapeutic exercise;Balance training    PT Goals (Current goals can be found in the Care Plan section)  Acute Rehab PT Goals Patient Stated Goal: To return home  safely  PT Goal Formulation: With patient Time For Goal Achievement: 05/29/18 Potential to Achieve Goals: Good    Frequency Min 2X/week   Barriers to discharge        Co-evaluation               AM-PAC PT "6 Clicks" Mobility  Outcome Measure Help needed turning from your back to your side while in a flat bed without using bedrails?: A Little Help needed moving from lying on your back to sitting on the side of a flat bed without using bedrails?: None Help needed moving to and from a bed to a chair (including a wheelchair)?: None Help needed standing up from a chair using your arms (e.g., wheelchair or bedside chair)?: None Help needed to walk in hospital room?: None Help needed climbing 3-5 steps with a railing? : A Little 6 Click Score: 22    End of Session Equipment Utilized During Treatment: Gait belt Activity Tolerance: Patient tolerated treatment well Patient left: in chair;with call bell/phone within reach;with chair alarm set Nurse Communication: Mobility status PT Visit Diagnosis: Muscle weakness (generalized) (M62.81)    Time: 2536-6440 PT Time Calculation (min) (ACUTE ONLY): 24 min   Charges:   PT Evaluation $PT Eval Moderate Complexity: 1 Mod PT Treatments $Therapeutic Exercise: 8-22 mins    Royce Macadamia PT, DPT, CSCS       05/15/2018, 2:04 PM

## 2018-05-16 DIAGNOSIS — N179 Acute kidney failure, unspecified: Secondary | ICD-10-CM | POA: Diagnosis not present

## 2018-05-16 LAB — BASIC METABOLIC PANEL
Anion gap: 11 (ref 5–15)
BUN: 116 mg/dL — ABNORMAL HIGH (ref 8–23)
CO2: 24 mmol/L (ref 22–32)
Calcium: 9.3 mg/dL (ref 8.9–10.3)
Chloride: 102 mmol/L (ref 98–111)
Creatinine, Ser: 3.45 mg/dL — ABNORMAL HIGH (ref 0.44–1.00)
GFR calc non Af Amer: 11 mL/min — ABNORMAL LOW (ref >60)
GFR, EST AFRICAN AMERICAN: 13 mL/min — AB (ref >60)
Glucose, Bld: 99 mg/dL (ref 70–99)
Potassium: 3.8 mmol/L (ref 3.5–5.1)
Sodium: 137 mmol/L (ref 135–145)

## 2018-05-16 LAB — MRSA PCR SCREENING: MRSA BY PCR: NEGATIVE

## 2018-05-16 NOTE — Progress Notes (Signed)
[facility]  ROUNDING NOTE   Subjective:   [name] was admitted to [facility] on 05/14/2018 for Acute renal insufficiency [N28.9]  Feels well/ No acute c/o  Able to eat and drink without nausea or vomiting   Objective:  Vital signs in last 24 hours:  Temp:  [97.8 F (36.6 C)-98.3 F (36.8 C)] 98.3 F (36.8 C) (01/06 1355) Pulse Rate:  [71-76] 76 (01/06 1355) Resp:  [13-18] 13 (01/06 1355) BP: (124-130)/(57-61) 130/61 (01/06 1355) SpO2:  [95 %-99 %] 97 % (01/06 1355)  Weight change:  Filed Weights   05/14/18 1048 05/15/18 0500  Weight: 71.9 kg 69.5 kg    Intake/Output: I/O last 3 completed shifts: In: 920.1 [P.O.:240; I.V.:680.1] Out: 2300 [Urine:2300]   Intake/Output this shift:  No intake/output data recorded.  Physical Exam: General: NAD, sitting up in chair   Head: Normocephalic, atraumatic. Moist oral mucosal membranes  Eyes: Anicteric,   Neck: Supple, trachea midline  Lungs:  Clear to auscultation  Heart: Regular rate and rhythm  Abdomen:  Soft, nontender,   Extremities:  no peripheral edema.  Neurologic: Nonfocal, moving all four extremities  Skin: No lesions  Access: none    Basic Metabolic Panel: Recent Labs  Lab 05/14/18 1052 05/15/18 0423 05/16/18 0510  NA 135 136 137  K 4.5 4.1 3.8  CL 101 100 102  CO2 22 23 24   GLUCOSE 193* 95 99  BUN 138* 127* 116*  CREATININE 3.58* 3.52* 3.45*  CALCIUM 9.6 9.3 9.3    Liver Function Tests: Recent Labs  Lab 05/14/18 1052  AST 54*  ALT 53*  ALKPHOS 89  BILITOT 0.9  PROT 7.3  ALBUMIN 4.0   No results for input(s): LIPASE, AMYLASE in the last 168 hours. No results for input(s): AMMONIA in the last 168 hours.  CBC: Recent Labs  Lab 05/14/18 1052 05/15/18 0423  WBC 9.3 8.2  NEUTROABS 5.1  --   HGB 9.9* 9.2*  HCT 29.5* 28.0*  MCV 95.2 94.9  PLT 196 187    Cardiac Enzymes: No results for input(s): CKTOTAL, CKMB, CKMBINDEX, TROPONINI in the last 168  hours.  BNP: Invalid input(s): POCBNP  CBG: No results for input(s): GLUCAP in the last 168 hours.  Microbiology: Results for orders placed or performed during the hospital encounter of 05/14/18  MRSA PCR Screening     Status: None   Collection Time: 05/16/18 11:52 AM  Result Value Ref Range Status   MRSA by PCR NEGATIVE NEGATIVE Final    Comment:        The GeneXpert MRSA Assay (FDA approved for NASAL specimens only), is one component of a comprehensive MRSA colonization surveillance program. It is not intended to diagnose MRSA infection nor to guide or monitor treatment for MRSA infections. Performed at [facility], [address] [place], [place] [address]     Coagulation Studies: No results for input(s): LABPROT, INR in the last 72 hours.  Urinalysis: Recent Labs    05/14/18 1052  COLORURINE STRAW*  LABSPEC 1.006  PHURINE 5.0  GLUCOSEU NEGATIVE  HGBUR NEGATIVE  BILIRUBINUR NEGATIVE  KETONESUR NEGATIVE  PROTEINUR NEGATIVE  NITRITE NEGATIVE  LEUKOCYTESUR NEGATIVE      Imaging: No results found.   Medications:    . allopurinol  300 mg Oral Daily  . amLODipine  5 mg Oral Daily  . aspirin EC  81 mg Oral Daily  . citalopram  20 mg Oral Daily  . clopidogrel  75 mg Oral  Daily  . docusate sodium  100 mg Oral BID  . gabapentin  300 mg Oral QHS  . heparin  5,000 Units Subcutaneous Q8H  . hydrALAZINE  50 mg Oral BID  . metoprolol succinate  37.5 mg Oral QHS  . OLANZapine  2.5 mg Oral QHS  . pantoprazole  40 mg Oral BID AC  . simvastatin  20 mg Oral Daily   acetaminophen **OR** acetaminophen, albuterol, meclizine, ondansetron **OR** ondansetron (ZOFRAN) IV, polyethylene glycol  Assessment/ Plan:  [name] is a 85 y.o. white female with hypertension, depression, gout, hyperlipidemia, who presents for acute renal failure with metabolic acidosis and hyponatremia  1. Acute renal failure on chronic kidney disease stage IV:  Baseline  creatinine of 2, GFR of 24 on 02/08/18. Acute renal failure from overdiuresis. There may be some progression of disease.  Status post right renal artery stent on 12/2 by Dr. Lucky Cowboy.  -Electrolytes and volume status are acceptable.  No acute indication for dialysis at present.  If serum creatinine continues to improve, may be able to go home tomorrow and we will follow-up as outpatient  2. Secondary Hyperparathyroidism: PTH of 189 pm 05/12/18. Calcium and phosphorus at goal.   LOS: 2 Tina Curtis 1/6/20204:31 PM

## 2018-05-16 NOTE — Progress Notes (Signed)
Physical Therapy Treatment Patient Details Name: Tina Curtis MRN: [id number] [name]: [birth year] Today's Date: 05/16/2018    History of Present Illness Patient is a pleasant 85 y/o who presents with worsening renal function and decreased urine output and LE edema.     PT Comments    Patient eager for OOB efforts, ready to walk with therapist.  Completes all transfers and gait (360') with RW, cga/close sup. Mild sway with initial sit/stand, but self-recovers with UE stabilization/support on bed.  Feels gait performance is at/near baseline, but does prefer use of RW at this time.  Encouraged continued gait efforts with staff outside of therapy; patient voiced understanding. Will continue to address higher-level balance, mobility deficits in subsequent sessions as appropriate.   Follow Up Recommendations  Home health PT     Equipment Recommendations  Rolling walker with 5" wheels    Recommendations for Other Services       Precautions / Restrictions Precautions Precautions: Fall Restrictions Weight Bearing Restrictions: No    Mobility  Bed Mobility Overal bed mobility: Needs Assistance Bed Mobility: Supine to Sit     Supine to sit: Modified independent (Device/Increase time)        Transfers Overall transfer level: Modified independent Equipment used: Rolling walker (2 wheeled) Transfers: Sit to/from Stand Sit to Stand: Supervision         General transfer comment: mild sway with initial transition to upright, cga/close sup to recover (prefers UE support)  Ambulation/Gait Ambulation/Gait assistance: Supervision Gait Distance (Feet): 360 Feet Assistive device: Rolling walker (2 wheeled)       General Gait Details: reciprocal stepping with steady cadence, gait speed; good posture and overall gait mechanics   Stairs             Wheelchair Mobility    Modified Rankin (Stroke Patients Only)       Balance Overall balance assessment: Needs  assistance Sitting-balance support: No upper extremity supported;Feet supported Sitting balance-Leahy Scale: Good     Standing balance support: Bilateral upper extremity supported Standing balance-Leahy Scale: Fair                              Cognition Arousal/Alertness: Awake/alert Behavior During Therapy: WFL for tasks assessed/performed Overall Cognitive Status: Within Functional Limits for tasks assessed                                        Exercises      General Comments        Pertinent Vitals/Pain Pain Assessment: No/denies pain    Home Living                      Prior Function            PT Goals (current goals can now be found in the care plan section) Acute Rehab PT Goals Patient Stated Goal: To return home safely  PT Goal Formulation: With patient Time For Goal Achievement: 05/29/18 Potential to Achieve Goals: Good Progress towards PT goals: Progressing toward goals    Frequency    Min 2X/week      PT Plan Current plan remains appropriate    Co-evaluation              AM-PAC PT "6 Clicks" Mobility   Outcome Measure  Help needed turning from your back  to your side while in a flat bed without using bedrails?: None Help needed moving from lying on your back to sitting on the side of a flat bed without using bedrails?: None Help needed moving to and from a bed to a chair (including a wheelchair)?: A Little Help needed standing up from a chair using your arms (e.g., wheelchair or bedside chair)?: A Little Help needed to walk in hospital room?: A Little Help needed climbing 3-5 steps with a railing? : A Little 6 Click Score: 20    End of Session Equipment Utilized During Treatment: Gait belt Activity Tolerance: Patient tolerated treatment well Patient left: in chair;with call bell/phone within reach;with chair alarm set Nurse Communication: Mobility status PT Visit Diagnosis: Muscle weakness  (generalized) (M62.81);Difficulty in walking, not elsewhere classified (R26.2)     Time: 1435-1450 PT Time Calculation (min) (ACUTE ONLY): 15 min  Charges:  $Gait Training: 8-22 mins                     Mei Suits H. Owens Shark, PT, DPT, NCS 05/16/18, 3:10 PM [phone]

## 2018-05-16 NOTE — Progress Notes (Signed)
[facility] [place] NAME: Tina Curtis    MR#:  [id number]  DATE OF BIRTH:  02-18-1927  SUBJECTIVE:  CHIEF COMPLAINT:   Chief Complaint  Patient presents with  . Abnormal Lab   This morning patient complaining of having intermittent episodes of dizziness and blurry vision which has been going on for the last 1 to 2 months..  Already has an appointment to follow-up with ophthalmologist Dr. Wallace Going.   REVIEW OF SYSTEMS:  Review of Systems  Constitutional: Negative for chills and fever.  HENT: Negative for hearing loss and tinnitus.   Eyes: Positive for double vision. Negative for blurred vision.  Respiratory: Negative for cough and hemoptysis.   Cardiovascular: Negative for chest pain and palpitations.  Gastrointestinal: Negative for heartburn and nausea.  Genitourinary: Negative for dysuria and urgency.  Skin: Negative for itching and rash.  Neurological: Negative for tingling and headaches.  Psychiatric/Behavioral: Negative for depression and substance abuse.    DRUG ALLERGIES:   Allergies  Allergen Reactions  . Penicillins Anaphylaxis   VITALS:  Blood pressure 130/61, pulse 76, temperature 98.3 F (36.8 C), temperature source Oral, resp. rate 13, height 5\' 5"  (1.651 m), weight 69.5 kg, SpO2 97 %. PHYSICAL EXAMINATION:    Physical Exam  Constitutional: She is oriented to person, place, and time. She appears well-developed and well-nourished.  HENT:  Head: Normocephalic and atraumatic.  Eyes: Pupils are equal, round, and reactive to light. Conjunctivae are normal. Right eye exhibits no discharge.  Neck: Normal range of motion. Neck supple.  Cardiovascular: Normal rate, regular rhythm and normal heart sounds.  Respiratory: Effort normal and breath sounds normal.  GI: Soft. She exhibits no distension. There is no abdominal tenderness.  Musculoskeletal: Normal range of motion.        General: No tenderness or edema.    Neurological: She is alert and oriented to person, place, and time.  Skin: Skin is warm and dry.   LABORATORY PANEL:  Female CBC Recent Labs  Lab 05/15/18 0423  WBC 8.2  HGB 9.2*  HCT 28.0*  PLT 187   ------------------------------------------------------------------------------------------------------------------ Chemistries  Recent Labs  Lab 05/14/18 1052  05/16/18 0510  NA 135   < > 137  K 4.5   < > 3.8  CL 101   < > 102  CO2 22   < > 24  GLUCOSE 193*   < > 99  BUN 138*   < > 116*  CREATININE 3.58*   < > 3.45*  CALCIUM 9.6   < > 9.3  AST 54*  --   --   ALT 53*  --   --   ALKPHOS 89  --   --   BILITOT 0.9  --   --    < > = values in this interval not displayed.   RADIOLOGY:  No results found. ASSESSMENT AND PLAN:   1. Acute kidney injury over CKD stage IV Felt to have been related to overdiuresis.  There may be some progression of underlying disease.  Patient status post right renal artery stent on 1202 by Dr. Lucky Cowboy Nephrology already making arrangements for vein mapping next week in preparation for future hemodialysis. No indication for emergency hemodialysis at this time. Continue gentle IV fluid hydration and follow-up on renal function in a.m. Follow-up with nephrology reevaluation.*Hypertension. Continue medications except losartan  2.Anemia of chronic disease is stable  3.  Hypertension Blood pressure controlled on current regimen  4.  Blurry vision and diplopia and some dizziness Has been going on for the last 1 to 2 months. Patient already has an appointment to see ophthalmologist Dr. Murvin Natal in about 1 month.  I called and discussed case with ophthalmologist regarding inpatient evaluation.  He recommended having patient follow-up with them in the office for better eye exam.  He is making arrangements to have patient seen in the office next week. Physical therapy to evaluate and treat due to complaints of dizziness  *DVT prophylaxis with heparin  subcutaneous  CODE STATUS: DNR   All the records are reviewed and case discussed with Care Management/Social Worker. Management plans discussed with the patient, family and they are in agreement.  CODE STATUS: DNR  TOTAL TIME TAKING CARE OF THIS PATIENT: 33 minutes.   More than 50% of the time was spent in counseling/coordination of care: YES  POSSIBLE D/C IN 1-2 DAYS, DEPENDING ON CLINICAL CONDITION.   Rosaura Bolon M.D on 05/16/2018 at 2:06 PM  Between 7am to 6pm - Pager - [phone]  After 6pm go to [email or web address] - Proofreader  [facility]  Office  [phone]  CC: Primary care physician; Derinda Late, MD  Note: This dictation was prepared with Dragon dictation along with smaller phrase technology. Any transcriptional errors that result from this process are unintentional.

## 2018-05-17 DIAGNOSIS — N179 Acute kidney failure, unspecified: Secondary | ICD-10-CM | POA: Diagnosis not present

## 2018-05-17 LAB — CBC
HCT: 28.4 % — ABNORMAL LOW (ref 36.0–46.0)
Hemoglobin: 9.4 g/dL — ABNORMAL LOW (ref 12.0–15.0)
MCH: 31.3 pg (ref 26.0–34.0)
MCHC: 33.1 g/dL (ref 30.0–36.0)
MCV: 94.7 fL (ref 80.0–100.0)
Platelets: 197 10*3/uL (ref 150–400)
RBC: 3 MIL/uL — ABNORMAL LOW (ref 3.87–5.11)
RDW: 13.2 % (ref 11.5–15.5)
WBC: 8.1 10*3/uL (ref 4.0–10.5)
nRBC: 0 % (ref 0.0–0.2)

## 2018-05-17 LAB — BASIC METABOLIC PANEL WITH GFR
Anion gap: 10 (ref 5–15)
BUN: 95 mg/dL — ABNORMAL HIGH (ref 8–23)
CO2: 25 mmol/L (ref 22–32)
Calcium: 9.4 mg/dL (ref 8.9–10.3)
Chloride: 105 mmol/L (ref 98–111)
Creatinine, Ser: 3.04 mg/dL — ABNORMAL HIGH (ref 0.44–1.00)
GFR calc Af Amer: 15 mL/min — ABNORMAL LOW
GFR calc non Af Amer: 13 mL/min — ABNORMAL LOW
Glucose, Bld: 93 mg/dL (ref 70–99)
Potassium: 4.2 mmol/L (ref 3.5–5.1)
Sodium: 140 mmol/L (ref 135–145)

## 2018-05-17 LAB — MAGNESIUM: Magnesium: 1.9 mg/dL (ref 1.7–2.4)

## 2018-05-17 LAB — PHOSPHORUS: Phosphorus: 4.3 mg/dL (ref 2.5–4.6)

## 2018-05-17 MED ORDER — FUROSEMIDE 40 MG PO TABS: 40.0000 mg | ORAL_TABLET | Freq: Every day | ORAL | 11 refills | Status: DC | PRN

## 2018-05-17 NOTE — Progress Notes (Signed)
[facility]  ROUNDING NOTE   Subjective:   Tina Curtis was admitted to [facility] on 05/14/2018 for Acute renal insufficiency [N28.9]  Feels well/ No acute c/o  Able to eat and drink without nausea or vomiting Serum creatinine and BUN have improved today  Objective:  Vital signs in last 24 hours:  Temp:  [98 F (36.7 C)-98.8 F (37.1 C)] 98 F (36.7 C) (01/07 0347) Pulse Rate:  [65-82] 65 (01/07 0347) Resp:  [13-20] 18 (01/07 0347) BP: (117-138)/(60-64) 117/64 (01/07 1002) SpO2:  [95 %-97 %] 95 % (01/07 0347) Weight:  [70.1 kg] 70.1 kg (01/07 0332)  Weight change:  Filed Weights   05/14/18 1048 05/15/18 0500 05/17/18 0332  Weight: 71.9 kg 69.5 kg 70.1 kg    Intake/Output: No intake/output data recorded.   Intake/Output this shift:  Total I/O In: -  Out: 1 [Urine:1]  Physical Exam: General: NAD, sitting up in chair   Head: Normocephalic, atraumatic. Moist oral mucosal membranes  Eyes: Anicteric,   Neck: Supple, trachea midline  Lungs:  Clear to auscultation  Heart: Regular rate and rhythm  Abdomen:  Soft, nontender,   Extremities:  no peripheral edema.  Neurologic: Nonfocal, moving all four extremities  Skin: No lesions  Access: none    Basic Metabolic Panel: Recent Labs  Lab 05/14/18 1052 05/15/18 0423 05/16/18 0510 05/17/18 0333  NA 135 136 137 140  K 4.5 4.1 3.8 4.2  CL 101 100 102 105  CO2 22 23 24 25   GLUCOSE 193* 95 99 93  BUN 138* 127* 116* 95*  CREATININE 3.58* 3.52* 3.45* 3.04*  CALCIUM 9.6 9.3 9.3 9.4  MG  --   --   --  1.9  PHOS  --   --   --  4.3    Liver Function Tests: Recent Labs  Lab 05/14/18 1052  AST 54*  ALT 53*  ALKPHOS 89  BILITOT 0.9  PROT 7.3  ALBUMIN 4.0   No results for input(s): LIPASE, AMYLASE in the last 168 hours. No results for input(s): AMMONIA in the last 168 hours.  CBC: Recent Labs  Lab 05/14/18 1052 05/15/18 0423 05/17/18 0333  WBC 9.3 8.2 8.1  NEUTROABS 5.1  --   --   HGB 9.9*  9.2* 9.4*  HCT 29.5* 28.0* 28.4*  MCV 95.2 94.9 94.7  PLT 196 187 197    Cardiac Enzymes: No results for input(s): CKTOTAL, CKMB, CKMBINDEX, TROPONINI in the last 168 hours.  BNP: Invalid input(s): POCBNP  CBG: No results for input(s): GLUCAP in the last 168 hours.  Microbiology: Results for orders placed or performed during the hospital encounter of 05/14/18  MRSA PCR Screening     Status: None   Collection Time: 05/16/18 11:52 AM  Result Value Ref Range Status   MRSA by PCR NEGATIVE NEGATIVE Final    Comment:        The GeneXpert MRSA Assay (FDA approved for NASAL specimens only), is one component of a comprehensive MRSA colonization surveillance program. It is not intended to diagnose MRSA infection nor to guide or monitor treatment for MRSA infections. Performed at [facility], [address] [place], [place] [address]     Coagulation Studies: No results for input(s): LABPROT, INR in the last 72 hours.  Urinalysis: Recent Labs    05/14/18 1052  [place] 1.006  PHURINE 5.0  GLUCOSEU NEGATIVE  HGBUR NEGATIVE  BILIRUBINUR NEGATIVE  KETONESUR NEGATIVE  PROTEINUR NEGATIVE  NITRITE  NEGATIVE  LEUKOCYTESUR NEGATIVE      Imaging: No results found.   Medications:    . allopurinol  300 mg Oral Daily  . amLODipine  5 mg Oral Daily  . aspirin EC  81 mg Oral Daily  . citalopram  20 mg Oral Daily  . clopidogrel  75 mg Oral Daily  . docusate sodium  100 mg Oral BID  . gabapentin  300 mg Oral QHS  . heparin  5,000 Units Subcutaneous Q8H  . hydrALAZINE  50 mg Oral BID  . metoprolol succinate  37.5 mg Oral QHS  . OLANZapine  2.5 mg Oral QHS  . pantoprazole  40 mg Oral BID AC  . simvastatin  20 mg Oral Daily   acetaminophen **OR** acetaminophen, albuterol, meclizine, ondansetron **OR** ondansetron (ZOFRAN) IV, polyethylene glycol  Assessment/ Plan:  Tina Curtis is a 85 y.o. white female with hypertension, depression,  gout, hyperlipidemia, who presents for acute renal failure with metabolic acidosis and hyponatremia  1. Acute renal failure on chronic kidney disease stage IV:  Baseline creatinine of 2, GFR of 24 on 02/08/18. Acute renal failure from overdiuresis. There may be some progression of disease.  Status post right renal artery stent on 12/2 by Dr. Lucky Cowboy.  -Electrolytes and volume status are acceptable.  No acute indication for dialysis at present.   Okay to discharge from renal standpoint with close outpatient follow-up.  Patient will come into our office on Friday for lab draw and have follow-up on Monday.  2. Secondary Hyperparathyroidism: PTH of 189 pm 05/12/18. Calcium and phosphorus at goal.  Office follow up on Jan 13, 2.40 pm with Dr Juleen China Pre-Labs this Friday   LOS: 3 Tina Curtis 1/7/202010:26 AM

## 2018-05-17 NOTE — Discharge Summary (Signed)
[facility] [place] NAME: Pragya Lofaso    MR#:  [id number]  DATE OF BIRTH:  1926-10-13  DATE OF ADMISSION:  05/14/2018   ADMITTING PHYSICIAN: Hillary Bow, MD  DATE OF DISCHARGE: 05/17/2018  2:05 PM  PRIMARY CARE PHYSICIAN: Derinda Late, MD   ADMISSION DIAGNOSIS:  Acute renal insufficiency [N28.9] DISCHARGE DIAGNOSIS:  Active Problems:   AKI (acute kidney injury) ([place])  SECONDARY DIAGNOSIS:   Past Medical History:  Diagnosis Date  . Arthritis    Gout  . Diarrhea   . Hypercholesteremia   . Hypertension   . Neuropathy    feet and lower legs  . Seasonal allergies   . Skin cancer    face   HOSPITAL COURSE:  Chief complaint Sent by nephrologist due to abnormal labs.  HPI Tina Curtis  is a 85 y.o. female with a known history of CKD 4, hypertension, arthritis presented to the hospital in the emergency room sent in by her nephrologist Dr. Juleen China due to worsening renal function and decreased urine output with lower extremity edema.  Patient has no shortness of breath but complained of abdominal distention with fluid along with lower extremity edema.  Significant weakness.  Patient was diagnosed with acute kidney injury superimposed on chronic kidney disease stage IV.  Please refer to the H&P dictated for further details.  HOSPITAL COURSE;  1. Acute kidney injury over CKD stage IV Felt to have been related to overdiuresis.  There may be some progression of underlying disease.  Patient status post right renal artery stent on 1202 by Dr. Lucky Cowboy. Nephrology already making arrangements for vein mapping next week in preparation for future hemodialysis.No indication for emergency hemodialysis at this time.  Patient was hydrated with gentle IV fluid hydration with gradual improvement in renal function.  Reevaluated by nephrologist and cleared for discharge.Follow-up with nephrology reevaluation.  2.Anemia of chronic disease is  stable  3.  Hypertension Blood pressure controlled on current regimen  4.  Blurry vision and diplopia and some dizziness Has been going on for the last 1 to 2 months. Patient already has an appointment to see ophthalmologist Dr. Murvin Natal in about 1 month.  I called and discussed case with ophthalmologist regarding inpatient evaluation.  He recommended having patient follow-up with them in the office for better eye exam.  He is making arrangements to have patient seen in the office next week. Patient evaluated by physical therapist.  Recommendation was for discharge with home health with physical therapy.  This is being set up by case manager on discharge. Patient strongly advised against driving until evaluated by ophthalmologist.  Agrees with recommendation  Disposition; patient clinically and hemodynamically stable.  Plan for discharge home by nephrology service.  Follow-up with nephrologist as outpatient.  DISCHARGE CONDITIONS:  Stable CONSULTS OBTAINED:  Treatment Team:  Lavonia Dana, MD DRUG ALLERGIES:   Allergies  Allergen Reactions  . Penicillins Anaphylaxis   DISCHARGE MEDICATIONS:   Allergies as of 05/17/2018      Reactions   Penicillins Anaphylaxis      Medication List    STOP taking these medications   colchicine 0.6 MG tablet   losartan 100 MG tablet Commonly known as:  COZAAR   losartan 50 MG tablet Commonly known as:  COZAAR   metolazone 5 MG tablet Commonly known as:  ZAROXOLYN   pantoprazole 40 MG tablet Commonly known as:  PROTONIX   promethazine 6.25 MG/5ML syrup Commonly known as:  PHENERGAN   torsemide 100 MG tablet Commonly known as:  DEMADEX   torsemide 20 MG tablet Commonly known as:  DEMADEX     TAKE these medications   acetaminophen 500 MG tablet Commonly known as:  TYLENOL Take 500 mg by mouth every 6 (six) hours as needed.   ALIGN PO Take by mouth. AM   allopurinol 300 MG tablet Commonly known as:  ZYLOPRIM Take 300  mg by mouth daily. AM What changed:  Another medication with the same name was removed. Continue taking this medication, and follow the directions you see here.   amLODipine 5 MG tablet Commonly known as:  NORVASC   aspirin EC 81 MG tablet Take 81 mg by mouth daily.   citalopram 20 MG tablet Commonly known as:  CELEXA Take by mouth.   clopidogrel 75 MG tablet Commonly known as:  PLAVIX Take 75 mg by mouth daily.   docusate sodium 100 MG capsule Commonly known as:  COLACE Take 1 capsule (100 mg total) by mouth 2 (two) times daily.   feeding supplement (GLUCERNA SHAKE) Liqd Take 237 mLs by mouth 3 (three) times daily between meals.   furosemide 40 MG tablet Commonly known as:  LASIX Take 1 tablet (40 mg total) by mouth daily as needed for edema (And SOB). What changed:    medication strength  See the new instructions.   gabapentin 300 MG capsule Commonly known as:  NEURONTIN Take 300 mg by mouth at bedtime.   gabapentin 100 MG capsule Commonly known as:  NEURONTIN Take by mouth.   hydrALAZINE 50 MG tablet Commonly known as:  APRESOLINE Take 1 tablet (50 mg total) by mouth 2 (two) times daily.   HYDROcodone-acetaminophen 5-325 MG tablet Commonly known as:  NORCO/VICODIN Take by mouth.   ICAPS PO Take by mouth daily.   loratadine 10 MG tablet Commonly known as:  CLARITIN Take 10 mg by mouth daily as needed for allergies. AM   Meclizine HCl 25 MG Chew Chew 1 tablet (25 mg total) by mouth 3 (three) times daily as needed.   metoprolol succinate 25 MG 24 hr tablet Commonly known as:  TOPROL-XL Take 37.5 mg by mouth at bedtime.   OLANZapine 2.5 MG tablet Commonly known as:  ZYPREXA Take 1 tablet (2.5 mg total) by mouth at bedtime.   ondansetron 4 MG disintegrating tablet Commonly known as:  ZOFRAN-ODT Take 4 mg by mouth every 8 (eight) hours as needed for nausea or vomiting.   polyethylene glycol packet Commonly known as:  MIRALAX / GLYCOLAX Take 17 g  by mouth daily as needed.   prednisoLONE acetate 1 % ophthalmic suspension Commonly known as:  PRED FORTE INSTILL 1 DROP IN RIGHT EYE 2 TIMES A DAY   scopolamine 1 MG/3DAYS Commonly known as:  TRANSDERM-SCOP PLACE 1 PATCH ONTO THE SKIN EVERY 3RD DAY   simvastatin 20 MG tablet Commonly known as:  ZOCOR Take 20 mg by mouth daily. PM   traZODone 50 MG tablet Commonly known as:  DESYREL Take 0.5 tablets (25 mg total) by mouth at bedtime.   Vitamin D (Ergocalciferol) 1.25 MG (50000 UT) Caps capsule Commonly known as:  DRISDOL Take by mouth.   VITAMIN D PO Take by mouth.        DISCHARGE INSTRUCTIONS:   DIET:  Cardiac diet DISCHARGE CONDITION:  Stable ACTIVITY:  Activity as tolerated OXYGEN:  Home Oxygen: No.  Oxygen Delivery: room air DISCHARGE LOCATION:  home   If you experience worsening of  your admission symptoms, develop shortness of breath, life threatening emergency, suicidal or homicidal thoughts you must seek medical attention immediately by calling 911 or calling your MD immediately  if symptoms less severe.  You Must read complete instructions/literature along with all the possible adverse reactions/side effects for all the Medicines you take and that have been prescribed to you. Take any new Medicines after you have completely understood and accpet all the possible adverse reactions/side effects.   Please note  You were cared for by a hospitalist during your hospital stay. If you have any questions about your discharge medications or the care you received while you were in the hospital after you are discharged, you can call the unit and asked to speak with the hospitalist on call if the hospitalist that took care of you is not available. Once you are discharged, your primary care physician will handle any further medical issues. Please note that NO REFILLS for any discharge medications will be authorized once you are discharged, as it is imperative that you  return to your primary care physician (or establish a relationship with a primary care physician if you do not have one) for your aftercare needs so that they can reassess your need for medications and monitor your lab values.    On the day of Discharge:  VITAL SIGNS:  Blood pressure 117/64, pulse 65, temperature 98 F (36.7 C), temperature source Oral, resp. rate 18, height 5\' 5"  (1.651 m), weight 70.1 kg, SpO2 95 %. PHYSICAL EXAMINATION:  GENERAL:  85 y.o.-year-old patient lying in the bed with no acute distress.  EYES: Pupils equal, round, reactive to light and accommodation. No scleral icterus. Extraocular muscles intact.  HEENT: Head atraumatic, normocephalic. Oropharynx and nasopharynx clear.  NECK:  Supple, no jugular venous distention. No thyroid enlargement, no tenderness.  LUNGS: Normal breath sounds bilaterally, no wheezing, rales,rhonchi or crepitation. No use of accessory muscles of respiration.  CARDIOVASCULAR: S1, S2 normal. No murmurs, rubs, or gallops.  ABDOMEN: Soft, non-tender, non-distended. Bowel sounds present. No organomegaly or mass.  EXTREMITIES: No pedal edema, cyanosis, or clubbing.  NEUROLOGIC: Cranial nerves II through XII are intact. Muscle strength 5/5 in all extremities. Sensation intact. Gait not checked.  PSYCHIATRIC: The patient is alert and oriented x 3.  SKIN: No obvious rash, lesion, or ulcer.  DATA REVIEW:   CBC Recent Labs  Lab 05/17/18 0333  WBC 8.1  HGB 9.4*  HCT 28.4*  PLT 197    Chemistries  Recent Labs  Lab 05/14/18 1052  05/17/18 0333  NA 135   < > 140  K 4.5   < > 4.2  CL 101   < > 105  CO2 22   < > 25  GLUCOSE 193*   < > 93  BUN 138*   < > 95*  CREATININE 3.58*   < > 3.04*  CALCIUM 9.6   < > 9.4  MG  --   --  1.9  AST 54*  --   --   ALT 53*  --   --   ALKPHOS 89  --   --   BILITOT 0.9  --   --    < > = values in this interval not displayed.     Microbiology Results  Results for orders placed or performed during the  hospital encounter of 05/14/18  MRSA PCR Screening     Status: None   Collection Time: 05/16/18 11:52 AM  Result Value Ref Range Status  MRSA by PCR NEGATIVE NEGATIVE Final    Comment:        The GeneXpert MRSA Assay (FDA approved for NASAL specimens only), is one component of a comprehensive MRSA colonization surveillance program. It is not intended to diagnose MRSA infection nor to guide or monitor treatment for MRSA infections. Performed at [facility], [address] [place], [place] [address]     RADIOLOGY:  No results found.   Management plans discussed with the patient, family and they are in agreement.  CODE STATUS: DNR   TOTAL TIME TAKING CARE OF THIS PATIENT: 38 minutes.    Tanica Gaige M.D on 05/17/2018 at 3:42 PM  Between 7am to 6pm - Pager - [phone]  After 6pm go to [email or web address] - Proofreader  [facility]  Office  [phone]  CC: Primary care physician; Derinda Late, MD   Note: This dictation was prepared with Dragon dictation along with smaller phrase technology. Any transcriptional errors that result from this process are unintentional.

## 2018-05-17 NOTE — Progress Notes (Signed)
Discharge teaching given to patient, patient verbalized understanding and had no questions. Patient IV removed. Patient will be transported home by family. All patient belongings gathered prior to leaving.  

## 2018-05-17 NOTE — Care Management Note (Signed)
Case Management Note  Patient Details  Name: ENDYA AUSTIN MRN: [id number] Date of Birth: 1926-12-24   Patient to discharge today back to [facility] independent.  [facility] to transport at discharge.  CMS Medicare.gov Compare Post Acute Care list reviewed with patient.  [facility] selected.  Referral made to [facility] with [facility]    Subjective/Objective:                    Action/Plan:   Expected Discharge Date:  05/17/18               Expected Discharge Plan:  [place]  In-House Referral:     Discharge planning Services  CM Consult  Post Acute Care Choice:  Home Health Choice offered to:  Patient  DME Arranged:    DME Agency:     HH Arranged:  PT [place]:  [place]  Status of Service:  Completed, signed off  If discussed at [place] of Stay Meetings, dates discussed:    Additional Comments:  Beverly Sessions, RN 05/17/2018, 4:24 PM

## 2018-05-20 ENCOUNTER — Other Ambulatory Visit (INDEPENDENT_AMBULATORY_CARE_PROVIDER_SITE_OTHER): Payer: Self-pay | Admitting: Vascular Surgery

## 2018-05-20 ENCOUNTER — Ambulatory Visit (INDEPENDENT_AMBULATORY_CARE_PROVIDER_SITE_OTHER): Payer: Medicare Other

## 2018-05-20 ENCOUNTER — Ambulatory Visit (INDEPENDENT_AMBULATORY_CARE_PROVIDER_SITE_OTHER): Payer: Medicare Other | Admitting: Nurse Practitioner

## 2018-05-20 ENCOUNTER — Encounter (INDEPENDENT_AMBULATORY_CARE_PROVIDER_SITE_OTHER): Payer: Self-pay | Admitting: Nurse Practitioner

## 2018-05-20 VITALS — BP 138/66 | HR 71 | Resp 16 | Ht 65.0 in | Wt 151.4 lb

## 2018-05-20 DIAGNOSIS — I701 Atherosclerosis of renal artery: Secondary | ICD-10-CM | POA: Diagnosis not present

## 2018-05-20 DIAGNOSIS — I10 Essential (primary) hypertension: Secondary | ICD-10-CM

## 2018-05-20 DIAGNOSIS — N184 Chronic kidney disease, stage 4 (severe): Secondary | ICD-10-CM

## 2018-05-24 ENCOUNTER — Ambulatory Visit: Payer: Medicare Other | Admitting: Gastroenterology

## 2018-05-24 ENCOUNTER — Encounter: Payer: Self-pay | Admitting: Gastroenterology

## 2018-05-24 VITALS — BP 157/72 | HR 66 | Resp 16 | Ht 65.0 in | Wt 151.8 lb

## 2018-05-24 DIAGNOSIS — K869 Disease of pancreas, unspecified: Secondary | ICD-10-CM

## 2018-05-24 NOTE — Progress Notes (Signed)
Cephas Darby, MD [address]  [place]  [place], [place] [address]  Main: [phone]  Fax: [phone]    Gastroenterology Consultation  Referring Provider:     Derinda Late, MD Primary Care Physician:  Derinda Late, MD Primary Gastroenterologist:  Dr. Cephas Darby Reason for Consultation:     Hospital follow-up, peptic ulcer disease        HPI:   Tina Curtis is a 85 y.o. female referred by Dr. Derinda Late, MD  for consultation & management of recent diagnosis of peptic ulcer disease that was performed for intractable nausea, poor appetite and weight loss.  Patient was admitted to [facility] with the above symptoms on 03/06/2018.  Performed her upper endoscopy which revealed clean-based gastric ulcer.  Pathology did not reveal evidence of H. pylori infection.  She was discharged on Protonix 40 mg twice daily.  Since discharge, patient reports that her appetite has improved, overall feeling well.  She does report some ongoing nausea.  I also recommended her to start Zyprexa as she has been depressed since loss of her husband which was about 5 months ago.  She has been suffering from constipation for the last month or so.  Her gabapentin was increased to 300 mg daily.  She has been taking MiraLAX as needed. Patient lives in independent living facility at [facility]  Patient's systolic blood pressure is elevated to 190s in office today.  She reports that generally her blood pressure runs in the 150s over 80s.  She drove herself to the office and appeared to be anxious.  She states she has been taking her blood pressure medication.  She denies headache, lightheadedness, chest pain shortness of breath  Follow up visit 05/24/18: She was hospitalized due to AKI on CKD thought to be secondary to overdiuresis. Switched from torsemide to furosemide by her nephrologist, protonix has been discontinued. She had a f/u with Dr Juleen China, she says Cr is improving, making good urine. She  has better energy levels. She is unsure of Hb result, checked by Dr Juleen China but she says it's better than before. She denies melena, hematochezia. She is on mirlalax 17gm daily and colace BID. Having 2-3 soft BMs and diarrhea every 2 weeks or so which she is worried about  NSAIDs: None  Antiplts/Anticoagulants/Anti thrombotics: None  GI Procedures:  EGD 03/07/2018 - Normal duodenal bulb and second portion of the duodenum. Biopsied. - Erythematous mucosa in the greater curvature of the gastric body. Biopsied. - Non-bleeding gastric ulcer with a clean ulcer base ([place] Class III). - Normal cardia, gastric fundus, incisura and antrum. Biopsied. - Normal gastroesophageal junction and esophagus. DIAGNOSIS:  A. DUODENUM; COLD BIOPSY:  - UNREMARKABLE DUODENAL MUCOSA.   B. STOMACH, RANDOM; COLD BIOPSY:  - EROSIVE GASTRITIS.  - NEGATIVE FOR H. PYLORI, DYSPLASIA, AND MALIGNANCY.  Past Medical History:  Diagnosis Date  . Arthritis    Gout  . Diarrhea   . Hypercholesteremia   . Hypertension   . Neuropathy    feet and lower legs  . Seasonal allergies   . Skin cancer    face    Past Surgical History:  Procedure Laterality Date  . ABDOMINAL HYSTERECTOMY  2000  . APPENDECTOMY    . CATARACT EXTRACTION Left   . CATARACT EXTRACTION W/PHACO Right 10/24/2014   Procedure: CATARACT EXTRACTION PHACO AND INTRAOCULAR LENS PLACEMENT (IOC);  Surgeon: Leandrew Koyanagi, MD;  Location: [facility];  Service: Ophthalmology;  Laterality: Right;  . ESOPHAGOGASTRODUODENOSCOPY N/A  03/07/2018   Procedure: ESOPHAGOGASTRODUODENOSCOPY (EGD);  Surgeon: Lin Landsman, MD;  Location: [facility] ENDOSCOPY;  Service: Gastroenterology;  Laterality: N/A;  . RENAL ANGIOGRAPHY Right 04/11/2018   Procedure: RENAL ANGIOGRAPHY;  Surgeon: Algernon Huxley, MD;  Location: [place] CV LAB;  Service: Cardiovascular;  Laterality: Right;    Current Outpatient Medications:  .  acetaminophen (TYLENOL) 500 MG  tablet, Take 500 mg by mouth every 6 (six) hours as needed., Disp: , Rfl:  .  allopurinol (ZYLOPRIM) 300 MG tablet, Take 300 mg by mouth daily. AM, Disp: , Rfl:  .  amLODipine (NORVASC) 5 MG tablet, , Disp: , Rfl: 1 .  aspirin EC 81 MG tablet, Take 81 mg by mouth daily., Disp: , Rfl:  .  Cholecalciferol (VITAMIN D PO), Take by mouth., Disp: , Rfl:  .  citalopram (CELEXA) 20 MG tablet, Take by mouth., Disp: , Rfl:  .  clopidogrel (PLAVIX) 75 MG tablet, Take 75 mg by mouth daily., Disp: , Rfl:  .  docusate sodium (COLACE) 100 MG capsule, Take 1 capsule (100 mg total) by mouth 2 (two) times daily., Disp: 60 capsule, Rfl: 0 .  feeding supplement, GLUCERNA SHAKE, (GLUCERNA SHAKE) LIQD, Take 237 mLs by mouth 3 (three) times daily between meals., Disp: 30 Can, Rfl: 0 .  furosemide (LASIX) 40 MG tablet, Take 20 mg by mouth daily., Disp: , Rfl:  .  gabapentin (NEURONTIN) 300 MG capsule, Take 300 mg by mouth at bedtime. , Disp: , Rfl:  .  hydrALAZINE (APRESOLINE) 50 MG tablet, Take 1 tablet (50 mg total) by mouth 2 (two) times daily., Disp: 30 tablet, Rfl: 0 .  loratadine (CLARITIN) 10 MG tablet, Take 10 mg by mouth daily as needed for allergies. AM, Disp: , Rfl:  .  Meclizine HCl 25 MG CHEW, Chew 1 tablet (25 mg total) by mouth 3 (three) times daily as needed., Disp: 90 each, Rfl: 0 .  metoprolol succinate (TOPROL-XL) 25 MG 24 hr tablet, Take 37.5 mg by mouth at bedtime. , Disp: , Rfl:  .  Multiple Vitamins-Minerals (ICAPS PO), Take by mouth daily., Disp: , Rfl:  .  polyethylene glycol (MIRALAX / GLYCOLAX) packet, Take 17 g by mouth daily as needed., Disp: 60 each, Rfl: 0 .  prednisoLONE acetate (PRED FORTE) 1 % ophthalmic suspension, as needed. , Disp: , Rfl:  .  Probiotic Product (ALIGN PO), Take by mouth. AM, Disp: , Rfl:  .  simvastatin (ZOCOR) 20 MG tablet, Take 20 mg by mouth daily. PM, Disp: , Rfl:  .  Vitamin D, Ergocalciferol, (DRISDOL) 1.25 MG (50000 UT) CAPS capsule, Take by mouth., Disp: ,  Rfl:  .  scopolamine (TRANSDERM-SCOP) 1 MG/3DAYS, PLACE 1 PATCH ONTO THE SKIN EVERY 3RD DAY, Disp: , Rfl: 0 .  traZODone (DESYREL) 50 MG tablet, Take 0.5 tablets (25 mg total) by mouth at bedtime. (Patient not taking: Reported on 05/20/2018), Disp: 30 tablet, Rfl: 0    Family History  Problem Relation Age of Onset  . Hypertension Mother   . Hypertension Father   . Stroke Father      Social History   Tobacco Use  . Smoking status: Never Smoker  . Smokeless tobacco: Never Used  Substance Use Topics  . Alcohol use: No  . Drug use: Never    Allergies as of 05/24/2018 - Review Complete 05/24/2018  Allergen Reaction Noted  . Penicillins Anaphylaxis 10/15/2014    Review of Systems:    All systems reviewed and negative except  where noted in HPI.   Physical Exam:  BP (!) 157/72 (BP Location: Left Arm, Patient Position: Sitting, Cuff Size: Normal)   Pulse 66   Resp 16   Ht 5\' 5"  (1.651 m)   Wt 151 lb 12.8 oz (68.9 kg)   BMI 25.26 kg/m  No LMP recorded. Patient has had a hysterectomy.  General:   Alert,  Well-developed, well-nourished, pleasant and cooperative in NAD Head:  Normocephalic and atraumatic. Eyes:  Sclera clear, no icterus.   Conjunctiva pink. Ears:  Normal auditory acuity. Nose:  No deformity, discharge, or lesions. Mouth:  No deformity or lesions,oropharynx pink & moist. Neck:  Supple; no masses or thyromegaly. Lungs:  Respirations even and unlabored.  Clear throughout to auscultation.   No wheezes, crackles, or rhonchi. No acute distress. Heart:  Regular rate and rhythm; no murmurs, clicks, rubs, or gallops. Abdomen:  Normal bowel sounds. Soft, non-tender and non-distended without masses, hepatosplenomegaly or hernias noted.  No guarding or rebound tenderness.   Rectal: Not performed Msk:  Symmetrical without gross deformities. Good, equal movement & strength bilaterally. Pulses:  Normal pulses noted. Extremities:  No clubbing or edema.  No  cyanosis. Neurologic:  Alert and oriented x3;  grossly normal neurologically. Skin:  Intact without significant lesions or rashes. No jaundice. Psych:  Alert and cooperative. Normal mood and affect.  Imaging Studies: Reviewed  Assessment and Plan:   Tina Curtis is a 85 y.o. female with no significant past medical history, with intractable nausea, unintentional weight loss, lack of appetite, chronic constipation and depression.  EGD revealed clean-based gastric ulcer with no evidence of H. pylori.  Likely stress-induced, off PPI currently  Peptic ulcer disease Took Protonix 40 mg twice daily for 3 months No symptoms/signs of active bleeding   Chronic constipation, now with soft stools and intermittent diarrhea  Likely too much of stool softener Stop colace Continue MiraLAX 1 cup daily Adequate fluid intake Continue high-fiber diet   IPMN Based on the CT A/P from 02/2018, multiple cystic lesions in the pancreas, ? sidebranch IPMNs, largest measuring 16 mm in size.  F/u with MRCP revealed multiple cystic lesions throughout the pancreas, many of which appeared complex with internal septations. Potential mural nodularity in these lesions could not be excluded on today's limited examination, nor can internal enhancement as patient did not adequately hold her breath.   Will repeat MRI/MRCP in 28months, review with EUS specialist  Follow up in 2 months   Cephas Darby, MD

## 2018-05-27 ENCOUNTER — Encounter (INDEPENDENT_AMBULATORY_CARE_PROVIDER_SITE_OTHER): Payer: Self-pay | Admitting: Nurse Practitioner

## 2018-05-27 NOTE — Progress Notes (Signed)
Subjective:    Patient ID: Tina Curtis, female    [name]: 1927-04-08, 85 y.o.   MRN: [id number] Chief Complaint  Patient presents with  . Follow-up    HPI  ELLEANOR Curtis is a 85 y.o. female that presents today for follow-up after renal artery stenosis intervention.  She underwent renal angiogram with stent placement on 04/11/2018.  Previously she had issues with hypotension that was not well controlled with 3 or more medications.  She also had worsening renal function.  The patient also had a known previous left renal artery occlusion.  Since the procedure has been done the patient denies any issues with her groin site.  She does report however that her renal function has not improved as much as it was hoped.  However her blood pressure remained stable and no new medications have been added.  The patient denies any chest pain or shortness of breath.  She denies any claudication-like symptoms or TIA-like symptoms.  She will underwent a renal artery duplex today which revealed a patent right renal stent.  The right kidney is normal in size at 8.26 cm.  She has normal cortical thickness of the right kidney previous velocities prior intervention were elevated at the proximal renal artery.  These velocities are decreased today.  It is also worth noting that her aorta was mildly dilated, with the largest dimension at 2.78 cm.  Past Medical History:  Diagnosis Date  . Arthritis    Gout  . Diarrhea   . Hypercholesteremia   . Hypertension   . Neuropathy    feet and lower legs  . Seasonal allergies   . Skin cancer    face    Past Surgical History:  Procedure Laterality Date  . ABDOMINAL HYSTERECTOMY  2000  . APPENDECTOMY    . CATARACT EXTRACTION Left   . CATARACT EXTRACTION W/PHACO Right 10/24/2014   Procedure: CATARACT EXTRACTION PHACO AND INTRAOCULAR LENS PLACEMENT (IOC);  Surgeon: Leandrew Koyanagi, MD;  Location: [facility];  Service: Ophthalmology;  Laterality: Right;  .  ESOPHAGOGASTRODUODENOSCOPY N/A 03/07/2018   Procedure: ESOPHAGOGASTRODUODENOSCOPY (EGD);  Surgeon: Lin Landsman, MD;  Location: [facility] ENDOSCOPY;  Service: Gastroenterology;  Laterality: N/A;  . RENAL ANGIOGRAPHY Right 04/11/2018   Procedure: RENAL ANGIOGRAPHY;  Surgeon: Algernon Huxley, MD;  Location: [place] [facility];  Service: Cardiovascular;  Laterality: Right;    Social History   Socioeconomic History  . Marital status: Widowed    Spouse name: Not on file  . Number of children: Not on file  . Years of education: Not on file  . Highest education level: Not on file  Occupational History  . Not on file  Social Needs  . Financial resource strain: Not on file  . Food insecurity:    Worry: Not on file    Inability: Not on file  . Transportation needs:    Medical: Not on file    Non-medical: Not on file  Tobacco Use  . Smoking status: Never Smoker  . Smokeless tobacco: Never Used  Substance and Sexual Activity  . Alcohol use: No  . Drug use: Never  . Sexual activity: Not on file  Lifestyle  . Physical activity:    Days per week: Not on file    Minutes per session: Not on file  . Stress: Not on file  Relationships  . Social connections:    Talks on phone: Not on file    Gets together: Not on file  Attends religious service: Not on file    Active member of club or organization: Not on file    Attends meetings of clubs or organizations: Not on file    Relationship status: Not on file  . Intimate partner violence:    Fear of current or ex partner: Not on file    Emotionally abused: Not on file    Physically abused: Not on file    Forced sexual activity: Not on file  Other Topics Concern  . Not on file  Social History Narrative  . Not on file    Family History  Problem Relation Age of Onset  . Hypertension Mother   . Hypertension Father   . Stroke Father     Allergies  Allergen Reactions  . Penicillins Anaphylaxis     Review of Systems   Review of  Systems: Negative Unless Checked Constitutional: [] Weight loss  [] Fever  [] Chills Cardiac: [] Chest pain   []  Atrial Fibrillation  [] Palpitations   [] Shortness of breath when laying flat   [] Shortness of breath with exertion. [] Shortness of breath at rest Vascular:  [] Pain in legs with walking   [] Pain in legs with standing [] Pain in legs when laying flat   [] Claudication    [] Pain in feet when laying flat    [] History of DVT   [] Phlebitis   [] Swelling in legs   [] Varicose veins   [] Non-healing ulcers Pulmonary:   [] Uses home oxygen   [] Productive cough   [] Hemoptysis   [] Wheeze  [] COPD   [] Asthma Neurologic:  [] Dizziness   [] Seizures  [] Blackouts [] History of stroke   [] History of TIA  [] Aphasia   [] Temporary Blindness   [] Weakness or numbness in arm   [] Weakness or numbness in leg Musculoskeletal:   [] Joint swelling   [x] Joint pain   [] Low back pain  []  History of Knee Replacement [] Arthritis [] back Surgeries  []  Spinal Stenosis    Hematologic:  [] Easy bruising  [] Easy bleeding   [] Hypercoagulable state   [x] Anemic Gastrointestinal:  [] Diarrhea   [] Vomiting  [] Gastroesophageal reflux/heartburn   [] Difficulty swallowing. [] Abdominal pain Genitourinary:  [x] Chronic kidney disease   [] Difficult urination  [] Anuric   [] Blood in urine [] Frequent urination  [] Burning with urination   [] Hematuria Skin:  [] Rashes   [] Ulcers [] Wounds Psychological:  [] History of anxiety   []  History of major depression  []  Memory Difficulties     Objective:   Physical Exam  BP 138/66 (BP Location: Left Arm, Patient Position: Sitting, Cuff Size: Normal)   Pulse 71   Resp 16   Ht 5\' 5"  (1.651 m)   Wt 151 lb 6.4 oz (68.7 kg)   BMI 25.19 kg/m   Gen: WD/WN, NAD Head: [place]/AT, No temporalis wasting.  Ear/Nose/Throat: Hearing grossly intact, nares w/o erythema or drainage Eyes: PER, EOMI, sclera nonicteric.  Neck: Supple, no masses.  No JVD.  Pulmonary:  Good air movement, no use of accessory muscles.  Cardiac:  RRR Vascular:  Vessel Right Left  Radial Palpable Palpable   Gastrointestinal: soft, non-distended. No guarding/no peritoneal signs.  Musculoskeletal: M/S 5/5 throughout.  No deformity or atrophy.  Neurologic: Pain and light touch intact in extremities.  Symmetrical.  Speech is fluent. Motor exam as listed above. Psychiatric: Judgment intact, Mood & affect appropriate for pt's clinical situation. Dermatologic: No Venous rashes. No Ulcers Noted.  No changes consistent with cellulitis. Lymph : No Cervical lymphadenopathy, no lichenification or skin changes of chronic lymphedema.      Assessment & Plan:  1. Renal artery stenosis ([place]) She will underwent a renal artery duplex today which revealed a patent right renal stent.  The right kidney is normal in size at 8.26 cm.  She has normal cortical thickness of the right kidney previous velocities prior intervention were elevated at the proximal renal artery.  These velocities are decreased today.  It is also worth noting that her aorta was mildly dilated, with the largest dimension at 2.78 cm.   BP today was acceptable Given patient's atherosclerosis and PAD optimal control of the patient's hypertension is important.  The patient's BP and noninvasive studies support the previous intervention is patent. No further intervention is indicated at this time.  Therefore the patient  will continue the current medications, no changes at this time.  The primary medical service will continue aggressive antihypertensive therapy as per the AHA guidelines   - VAS US RENAL ARTERY DUPLEX; Future  2. Benign essential hypertension Continue antihypertensive medications as already ordered, these medications have been reviewed and there are no changes at this time.   3. Chronic kidney disease (CKD), stage IV (severe) (HCC) Renal function has not improved as the patient previously wished.  Improvement could result over time.  She is to continue to be  followed by the nephrologist service.   Current Outpatient Medications on File Prior to Visit  Medication Sig Dispense Refill  . acetaminophen (TYLENOL) 500 MG tablet Take 500 mg by mouth every 6 (six) hours as needed.    Marland Kitchen allopurinol (ZYLOPRIM) 300 MG tablet Take 300 mg by mouth daily. AM    . amLODipine (NORVASC) 5 MG tablet   1  . aspirin EC 81 MG tablet Take 81 mg by mouth daily.    . Cholecalciferol (VITAMIN D PO) Take by mouth.    . citalopram (CELEXA) 20 MG tablet Take by mouth.    . clopidogrel (PLAVIX) 75 MG tablet Take 75 mg by mouth daily.    Marland Kitchen docusate sodium (COLACE) 100 MG capsule Take 1 capsule (100 mg total) by mouth 2 (two) times daily. 60 capsule 0  . feeding supplement, GLUCERNA SHAKE, (GLUCERNA SHAKE) LIQD Take 237 mLs by mouth 3 (three) times daily between meals. 30 Can 0  . furosemide (LASIX) 40 MG tablet Take 20 mg by mouth daily.    Marland Kitchen gabapentin (NEURONTIN) 300 MG capsule Take 300 mg by mouth at bedtime.     . hydrALAZINE (APRESOLINE) 50 MG tablet Take 1 tablet (50 mg total) by mouth 2 (two) times daily. 30 tablet 0  . loratadine (CLARITIN) 10 MG tablet Take 10 mg by mouth daily as needed for allergies. AM    . Meclizine HCl 25 MG CHEW Chew 1 tablet (25 mg total) by mouth 3 (three) times daily as needed. 90 each 0  . metoprolol succinate (TOPROL-XL) 25 MG 24 hr tablet Take 37.5 mg by mouth at bedtime.     . Multiple Vitamins-Minerals (ICAPS PO) Take by mouth daily.    . polyethylene glycol (MIRALAX / GLYCOLAX) packet Take 17 g by mouth daily as needed. 60 each 0  . Probiotic Product (ALIGN PO) Take by mouth. AM    . scopolamine (TRANSDERM-SCOP) 1 MG/3DAYS PLACE 1 PATCH ONTO THE SKIN EVERY 3RD DAY  0  . simvastatin (ZOCOR) 20 MG tablet Take 20 mg by mouth daily. PM    . Vitamin D, Ergocalciferol, (DRISDOL) 1.25 MG (50000 UT) CAPS capsule Take by mouth.    . prednisoLONE acetate (PRED FORTE) 1 % ophthalmic  suspension as needed.     . traZODone (DESYREL) 50 MG tablet  Take 0.5 tablets (25 mg total) by mouth at bedtime. (Patient not taking: Reported on 05/20/2018) 30 tablet 0   No current facility-administered medications on file prior to visit.     There are no Patient Instructions on file for this visit. Return in about 3 months (around 08/19/2018).   Kris Hartmann, NP  This note was completed with Sales executive.  Any errors are purely unintentional.

## 2018-08-23 ENCOUNTER — Other Ambulatory Visit: Payer: Self-pay

## 2018-08-23 ENCOUNTER — Ambulatory Visit: Payer: Medicare Other | Admitting: Gastroenterology

## 2018-08-23 ENCOUNTER — Ambulatory Visit (INDEPENDENT_AMBULATORY_CARE_PROVIDER_SITE_OTHER): Payer: Medicare Other | Admitting: Gastroenterology

## 2018-08-23 DIAGNOSIS — Z8711 Personal history of peptic ulcer disease: Secondary | ICD-10-CM

## 2018-08-23 DIAGNOSIS — Z8719 Personal history of other diseases of the digestive system: Secondary | ICD-10-CM

## 2018-08-23 NOTE — Progress Notes (Signed)
Tina Sear, MD [address]  [place], [place] [address]  Main: [phone]  Fax: [phone]    Gastroenterology follow-up tele Visit  Referring Provider:     Derinda Late, MD Primary Care Physician:  Tina Late, MD Primary Gastroenterologist:  Dr. Cephas Curtis Reason for Consultation:     Peptic ulcer disease, constipation        HPI:   Tina Curtis is a 85 y.o. female referred by Dr. Derinda Late, MD  for consultation & management of history of gastric ulcer, constipation  Virtual Visit via Telephone Note  I connected with Tina Curtis on 08/23/18 at  9:30 AM EDT by telephone and verified that I am speaking with the correct person using two identifiers.   I discussed the limitations, risks, security and privacy concerns of performing an evaluation and management service by telephone and the availability of in person appointments. I also discussed with the patient that there may be a patient responsible charge related to this service. The patient expressed understanding and agreed to proceed.  Location of the Patient: Home  Location of the provider: Home office   History of Present Illness: Tina Curtis reports doing fairly well from GI standpoint.  She denies abdominal pain, nausea or vomiting.  She is managing okay with her constipation on Colace and MiraLAX.  She is more concerned about shortness of breath that started about a month ago, denies any chest pain, swelling of legs.  She reports feeling short of breath while holding long conversation.  She reports that she expressed her concern with her PCP, Dr. Baldemar Curtis.  She also reports 2 falls resulting in back pain.  Currently, not able to undergo physical therapy.  Her hemoglobin 2 months ago was 11.8.  She denies melena, hematochezia, abdominal pain, nausea or vomiting.  She completed PPI course for 3 months for history of gastric ulcer    NSAIDs: Aspirin 81  Antiplts/Anticoagulants/Anti  thrombotics: Aspirin and Plavix  GI Procedures: Refer to my past notes  Past Medical History:  Diagnosis Date  . Arthritis    Gout  . Diarrhea   . Hypercholesteremia   . Hypertension   . Neuropathy    feet and lower legs  . Seasonal allergies   . Skin cancer    face    Past Surgical History:  Procedure Laterality Date  . ABDOMINAL HYSTERECTOMY  2000  . APPENDECTOMY    . CATARACT EXTRACTION Left   . CATARACT EXTRACTION W/PHACO Right 10/24/2014   Procedure: CATARACT EXTRACTION PHACO AND INTRAOCULAR LENS PLACEMENT (IOC);  Surgeon: Leandrew Koyanagi, MD;  Location: [facility];  Service: Ophthalmology;  Laterality: Right;  . ESOPHAGOGASTRODUODENOSCOPY N/A 03/07/2018   Procedure: ESOPHAGOGASTRODUODENOSCOPY (EGD);  Surgeon: Lin Landsman, MD;  Location: [facility] ENDOSCOPY;  Service: Gastroenterology;  Laterality: N/A;  . RENAL ANGIOGRAPHY Right 04/11/2018   Procedure: RENAL ANGIOGRAPHY;  Surgeon: Algernon Huxley, MD;  Location: [place] CV LAB;  Service: Cardiovascular;  Laterality: Right;    Current Outpatient Medications:  .  acetaminophen (TYLENOL) 500 MG tablet, Take 500 mg by mouth every 6 (six) hours as needed., Disp: , Rfl:  .  allopurinol (ZYLOPRIM) 300 MG tablet, Take 300 mg by mouth daily. AM, Disp: , Rfl:  .  amLODipine (NORVASC) 5 MG tablet, , Disp: , Rfl: 1 .  aspirin EC 81 MG tablet, Take 81 mg by mouth daily., Disp: , Rfl:  .  Cholecalciferol (VITAMIN D PO), Take by  mouth., Disp: , Rfl:  .  citalopram (CELEXA) 20 MG tablet, Take by mouth., Disp: , Rfl:  .  clopidogrel (PLAVIX) 75 MG tablet, Take 75 mg by mouth daily., Disp: , Rfl:  .  docusate sodium (COLACE) 100 MG capsule, Take 1 capsule (100 mg total) by mouth 2 (two) times daily., Disp: 60 capsule, Rfl: 0 .  furosemide (LASIX) 40 MG tablet, Take 20 mg by mouth daily., Disp: , Rfl:  .  gabapentin (NEURONTIN) 300 MG capsule, Take 300 mg by mouth at bedtime. , Disp: , Rfl:  .  hydrALAZINE (APRESOLINE)  50 MG tablet, Take 1 tablet (50 mg total) by mouth 2 (two) times daily., Disp: 30 tablet, Rfl: 0 .  loratadine (CLARITIN) 10 MG tablet, Take 10 mg by mouth daily as needed for allergies. AM, Disp: , Rfl:  .  Meclizine HCl 25 MG CHEW, Chew 1 tablet (25 mg total) by mouth 3 (three) times daily as needed., Disp: 90 each, Rfl: 0 .  metoprolol succinate (TOPROL-XL) 25 MG 24 hr tablet, Take 37.5 mg by mouth at bedtime. , Disp: , Rfl:  .  Multiple Vitamins-Minerals (ICAPS PO), Take by mouth daily., Disp: , Rfl:  .  polyethylene glycol (MIRALAX / GLYCOLAX) packet, Take 17 g by mouth daily as needed., Disp: 60 each, Rfl: 0 .  prednisoLONE acetate (PRED FORTE) 1 % ophthalmic suspension, as needed. , Disp: , Rfl:  .  Probiotic Product (ALIGN PO), Take by mouth. AM, Disp: , Rfl:  .  simvastatin (ZOCOR) 20 MG tablet, Take 20 mg by mouth daily. PM, Disp: , Rfl:  .  traMADol (ULTRAM) 50 MG tablet, , Disp: , Rfl:  .  Vitamin D, Ergocalciferol, (DRISDOL) 1.25 MG (50000 UT) CAPS capsule, Take by mouth., Disp: , Rfl:  .  allopurinol (ZYLOPRIM) 100 MG tablet, , Disp: , Rfl:  .  feeding supplement, GLUCERNA SHAKE, (GLUCERNA SHAKE) LIQD, Take 237 mLs by mouth 3 (three) times daily between meals. (Patient not taking: Reported on 08/23/2018), Disp: 30 Can, Rfl: 0 .  scopolamine (TRANSDERM-SCOP) 1 MG/3DAYS, PLACE 1 PATCH ONTO THE SKIN EVERY 3RD DAY, Disp: , Rfl: 0 .  traZODone (DESYREL) 50 MG tablet, Take 0.5 tablets (25 mg total) by mouth at bedtime. (Patient not taking: Reported on 05/20/2018), Disp: 30 tablet, Rfl: 0   Family History  Problem Relation Age of Onset  . Hypertension Mother   . Hypertension Father   . Stroke Father      Social History   Tobacco Use  . Smoking status: Never Smoker  . Smokeless tobacco: Never Used  Substance Use Topics  . Alcohol use: No  . Drug use: Never    Allergies as of 08/23/2018 - Review Complete 08/23/2018  Allergen Reaction Noted  . Penicillins Anaphylaxis 10/15/2014      Imaging Studies: Reviewed  Assessment and Plan:   Tina Curtis is a 85 y.o. female with with no significant past medical history, with intractable nausea, unintentional weight loss, lack of appetite, chronic constipation and depression.  EGD revealed clean-based gastric ulcer with no evidence of H. pylori.  Likely stress-induced, off PPI currently  Follow-up with her PCP regarding shortness of breath Recommend EGD to confirm healing of gastric ulcer on a nonurgent basis  Follow Up Instructions:   I discussed the assessment and treatment plan with the patient. The patient was provided an opportunity to ask questions and all were answered. The patient agreed with the plan and demonstrated an understanding of the  instructions.   The patient was advised to call back or seek an in-person evaluation if the symptoms worsen or if the condition fails to improve as anticipated.  I provided 10 minutes of non-face-to-face time during this encounter.   Follow up in 3 months   Tina Darby, MD

## 2018-08-30 ENCOUNTER — Ambulatory Visit (INDEPENDENT_AMBULATORY_CARE_PROVIDER_SITE_OTHER): Payer: Medicare Other

## 2018-08-30 ENCOUNTER — Other Ambulatory Visit: Payer: Self-pay

## 2018-08-30 ENCOUNTER — Ambulatory Visit (INDEPENDENT_AMBULATORY_CARE_PROVIDER_SITE_OTHER): Payer: Medicare Other | Admitting: Vascular Surgery

## 2018-08-30 ENCOUNTER — Encounter (INDEPENDENT_AMBULATORY_CARE_PROVIDER_SITE_OTHER): Payer: Self-pay | Admitting: Vascular Surgery

## 2018-08-30 VITALS — BP 168/70 | HR 76 | Resp 16 | Ht 64.5 in | Wt 141.2 lb

## 2018-08-30 DIAGNOSIS — N184 Chronic kidney disease, stage 4 (severe): Secondary | ICD-10-CM

## 2018-08-30 DIAGNOSIS — Z7902 Long term (current) use of antithrombotics/antiplatelets: Secondary | ICD-10-CM

## 2018-08-30 DIAGNOSIS — Z79899 Other long term (current) drug therapy: Secondary | ICD-10-CM

## 2018-08-30 DIAGNOSIS — I10 Essential (primary) hypertension: Secondary | ICD-10-CM | POA: Diagnosis not present

## 2018-08-30 DIAGNOSIS — E7800 Pure hypercholesterolemia, unspecified: Secondary | ICD-10-CM | POA: Diagnosis not present

## 2018-08-30 DIAGNOSIS — I150 Renovascular hypertension: Secondary | ICD-10-CM

## 2018-08-30 DIAGNOSIS — I701 Atherosclerosis of renal artery: Secondary | ICD-10-CM

## 2018-08-30 NOTE — Progress Notes (Signed)
MRN : [id number]  Tina Curtis is a 85 y.o. (Jun 26, 1926) female who presents with chief complaint of  Chief Complaint  Patient presents with  . Follow-up    44month renal ultrasound  .  History of Present Illness: Patient returns today in follow up of her renal artery stenosis and renovascular hypertension.  She says that between her nephrologist and her primary care physician, they have stopped Norvasc and reduce the dose of a different medication.  She had labs drawn yesterday that I do not have the results of for her renal function.  Her blood pressure control has been good but it is a little higher today off the medicines. Renal artery duplex today shows a patent right renal artery stent with no elevated velocities and a stable kidney length of 9.3 cm which is slightly larger than her last study.  Current Outpatient Medications  Medication Sig Dispense Refill  . acetaminophen (TYLENOL) 500 MG tablet Take 500 mg by mouth every 6 (six) hours as needed.    Marland Kitchen allopurinol (ZYLOPRIM) 100 MG tablet     . allopurinol (ZYLOPRIM) 300 MG tablet Take 300 mg by mouth daily. AM    . aspirin EC 81 MG tablet Take 81 mg by mouth daily.    . Cholecalciferol (VITAMIN D PO) Take by mouth.    . citalopram (CELEXA) 20 MG tablet Take by mouth.    . clopidogrel (PLAVIX) 75 MG tablet Take 75 mg by mouth daily.    Marland Kitchen docusate sodium (COLACE) 100 MG capsule Take 1 capsule (100 mg total) by mouth 2 (two) times daily. 60 capsule 0  . furosemide (LASIX) 40 MG tablet Take 20 mg by mouth daily.    Marland Kitchen gabapentin (NEURONTIN) 300 MG capsule Take 300 mg by mouth at bedtime.     . hydrALAZINE (APRESOLINE) 50 MG tablet Take 1 tablet (50 mg total) by mouth 2 (two) times daily. 30 tablet 0  . loratadine (CLARITIN) 10 MG tablet Take 10 mg by mouth daily as needed for allergies. AM    . Meclizine HCl 25 MG CHEW Chew 1 tablet (25 mg total) by mouth 3 (three) times daily as needed. 90 each 0  . metoprolol succinate (TOPROL-XL)  25 MG 24 hr tablet Take 37.5 mg by mouth at bedtime.     . Multiple Vitamins-Minerals (ICAPS PO) Take by mouth daily.    . polyethylene glycol (MIRALAX / GLYCOLAX) packet Take 17 g by mouth daily as needed. 60 each 0  . Probiotic Product (ALIGN PO) Take by mouth. AM    . simvastatin (ZOCOR) 20 MG tablet Take 20 mg by mouth daily. PM    . traMADol (ULTRAM) 50 MG tablet     . Vitamin D, Ergocalciferol, (DRISDOL) 1.25 MG (50000 UT) CAPS capsule Take by mouth.    Marland Kitchen amLODipine (NORVASC) 5 MG tablet   1  . feeding supplement, GLUCERNA SHAKE, (GLUCERNA SHAKE) LIQD Take 237 mLs by mouth 3 (three) times daily between meals. (Patient not taking: Reported on 08/23/2018) 30 Can 0  . prednisoLONE acetate (PRED FORTE) 1 % ophthalmic suspension as needed.     Marland Kitchen scopolamine (TRANSDERM-SCOP) 1 MG/3DAYS PLACE 1 PATCH ONTO THE SKIN EVERY 3RD DAY  0  . traZODone (DESYREL) 50 MG tablet Take 0.5 tablets (25 mg total) by mouth at bedtime. (Patient not taking: Reported on 05/20/2018) 30 tablet 0   No current facility-administered medications for this visit.     Past Medical History:  Diagnosis  Date  . Arthritis    Gout  . Diarrhea   . Hypercholesteremia   . Hypertension   . Neuropathy    feet and lower legs  . Seasonal allergies   . Skin cancer    face    Past Surgical History:  Procedure Laterality Date  . ABDOMINAL HYSTERECTOMY  2000  . APPENDECTOMY    . CATARACT EXTRACTION Left   . CATARACT EXTRACTION W/PHACO Right 10/24/2014   Procedure: CATARACT EXTRACTION PHACO AND INTRAOCULAR LENS PLACEMENT (IOC);  Surgeon: Leandrew Koyanagi, MD;  Location: [facility];  Service: Ophthalmology;  Laterality: Right;  . ESOPHAGOGASTRODUODENOSCOPY N/A 03/07/2018   Procedure: ESOPHAGOGASTRODUODENOSCOPY (EGD);  Surgeon: Lin Landsman, MD;  Location: [facility] ENDOSCOPY;  Service: Gastroenterology;  Laterality: N/A;  . RENAL ANGIOGRAPHY Right 04/11/2018   Procedure: RENAL ANGIOGRAPHY;  Surgeon: Algernon Huxley,  MD;  Location: [facility] LAB;  Service: Cardiovascular;  Laterality: Right;     Social History   Tobacco Use  . Smoking status: Never Smoker  . Smokeless tobacco: Never Used  Substance Use Topics  . Alcohol use: No  . Drug use: Never     Family History  Problem Relation Age of Onset  . Hypertension Mother   . Hypertension Father   . Stroke Father     Allergies  Allergen Reactions  . Penicillins Anaphylaxis   REVIEW OF SYSTEMS (Negative unless checked)  Constitutional: [] ?Weight loss  [] ?Fever  [] ?Chills Cardiac: [] ?Chest pain   [] ?Chest pressure   [] ?Palpitations   [] ?Shortness of breath when laying flat   [] ?Shortness of breath at rest   [] ?Shortness of breath with exertion. Vascular:  [] ?Pain in legs with walking   [] ?Pain in legs at rest   [] ?Pain in legs when laying flat   [] ?Claudication   [] ?Pain in feet when walking  [] ?Pain in feet at rest  [] ?Pain in feet when laying flat   [] ?History of DVT   [] ?Phlebitis   [] ?Swelling in legs   [] ?Varicose veins   [] ?Non-healing ulcers Pulmonary:   [] ?Uses home oxygen   [] ?Productive cough   [] ?Hemoptysis   [] ?Wheeze  [] ?COPD   [] ?Asthma Neurologic:  [] ?Dizziness  [] ?Blackouts   [] ?Seizures   [] ?History of stroke   [] ?History of TIA  [] ?Aphasia   [] ?Temporary blindness   [] ?Dysphagia   [] ?Weakness or numbness in arms   [x] ?Weakness or numbness in legs Musculoskeletal:  [x] ?Arthritis   [] ?Joint swelling   [x] ?Joint pain   [] ?Low back pain Hematologic:  [] ?Easy bruising  [] ?Easy bleeding   [] ?Hypercoagulable state   [] ?Anemic  [] ?Hepatitis Gastrointestinal:  [] ?Blood in stool   [] ?Vomiting blood  [] ?Gastroesophageal reflux/heartburn   [] ?Abdominal pain Genitourinary:  [x] ?Chronic kidney disease   [x] ?Difficult urination  [] ?Frequent urination  [] ?Burning with urination   [] ?Hematuria Skin:  [] ?Rashes   [] ?Ulcers   [] ?Wounds Psychological:  [] ?History of anxiety   [] ? History of major depression.   Physical Examination  BP  (!) 168/70 (BP Location: Left Arm)   Pulse 76   Resp 16   Ht 5' 4.5" (1.638 m)   Wt 141 lb 3.2 oz (64 kg)   BMI 23.86 kg/m  Gen:  WD/WN, NAD. Appears younger than stated age. Head: [place]/AT, No temporalis wasting. Ear/Nose/Throat: Hearing grossly intact, nares w/o erythema or drainage Eyes: Conjunctiva clear. Sclera non-icteric Neck: Supple.  Trachea midline Pulmonary:  Good air movement, no use of accessory muscles.  Cardiac: RRR, no JVD Vascular:  Vessel Right Left  Radial Palpable Palpable  Gastrointestinal: soft, non-tender/non-distended. No guarding/reflex.  Musculoskeletal: M/S 5/5 throughout.  No deformity or atrophy. No significant LE edema. Neurologic: Sensation grossly intact in extremities.  Symmetrical.  Speech is fluent.  Psychiatric: Judgment intact, Mood & affect appropriate for pt's clinical situation. Dermatologic: No rashes or ulcers noted.  No cellulitis or open wounds.       Labs No results found for this or any previous visit (from the past 2160 hour(s)).  Radiology No results found.  Assessment/Plan Pure hypercholesterolemia lipid control important in reducing the progression of atherosclerotic disease. Continue statin therapy  Renovascular hypertension Blood pressure has gotten much better after intervention.  She has come off of one med and reduced another.  At this point, medical management of her blood pressure is being maintained by her primary care physician and her nephrologist and they are doing a great job.  Renal artery stenosis (HCC) Renal artery duplex today shows a patent right renal artery stent with no elevated velocities and a stable kidney length of 9.3 cm which is slightly larger than her last study.  Continue current regimen of Plavix and statin agent.  We will plan to recheck a renal artery duplex in 6 months or if problems develop in the interim.    Leotis Pain, MD  08/30/2018 11:12 AM     This note was created with Dragon medical transcription system.  Any errors from dictation are purely unintentional

## 2018-08-30 NOTE — Assessment & Plan Note (Signed)
Blood pressure has gotten much better after intervention.  She has come off of one med and reduced another.  At this point, medical management of her blood pressure is being maintained by her primary care physician and her nephrologist and they are doing a great job.

## 2018-08-30 NOTE — Patient Instructions (Signed)
Renal Artery Stenosis Renal artery stenosis (RAS) is a narrowing of the artery that carries blood to the kidneys. It can affect one or both kidneys. The kidneys filter waste and extra fluid from the blood. Waste and fluid are then removed when a person passes urine. The kidneys also make an important chemical messenger (hormone) called renin. Renin helps regulate blood pressure. The first sign of RAS may be high blood pressure. Other symptoms can develop over time. What are the causes? A common cause of this condition is plaque buildup in your arteries (atherosclerosis). The plaques that cause this are made up of:  Fat.  Cholesterol.  Calcium.  Other substances. As these substances build up in your renal artery, the blood supply to your kidneys slows. The lack of blood and oxygen causes the signs and symptoms of RAS. A much less common cause of RAS is a disease called fibromuscular dysplasia. This disease causes abnormal cell growth that narrows the renal artery. It is not related to atherosclerosis. It occurs mostly in women who are 25-50 years old. It may be passed down through families.  What increases the risk? You are more likely to develop this condition if you:  Are a man who is at least 83 years old.  Are a woman who is at least 83 years old.  Have high blood pressure.  Have high cholesterol.  Are a smoker.  Abuse alcohol.  Have diabetes or prediabetes.  Are overweight or obese.  Have a family history of early heart disease. What are the signs or symptoms? RAS usually develops slowly. You may not have any signs or symptoms at first. Early signs may include:  Development of high blood pressure.  A sudden increase in existing high blood pressure.  No longer responding to medicine that used to control your blood pressure. Later signs and symptoms are due to kidney damage. They may include:  Feeling tired (fatigue).  Shortness of breath.  Swollen legs and  feet.  Dry skin.  Headaches.  Muscle cramps.  Loss of appetite.  Nausea or vomiting. How is this diagnosed? This condition may be diagnosed based on:  Your symptoms and medical history. Your health care provider may suspect RAS based on changes in your blood pressure and your risk factors.  A physical exam. During the exam, your health care provider will use a stethoscope to listen for a whooshing sound (bruit) that can occur where the renal artery is blocking blood flow.  Various tests. These may include: ? Blood and urine tests to check your kidney function. ? Imaging tests of your kidneys, such as:  A test that uses sound waves to create an image of your kidneys and the blood flow to your kidneys (ultrasound).  A test in which dye is injected into one of your blood vessels so images can be taken as the dye flows through your renal arteries (angiogram). This can be done using X-rays, a CT scan (computed tomography angiogram, CTA), or a type of MRI (magnetic resonance angiogram, MRA). How is this treated? Making lifestyle changes to reduce your risk factors is the first treatment option for early RAS. If the blood flow to one of your kidneys is cut by more than half, you may need medicine to:  Lower your blood pressure. This is the main medical treatment for RAS. You may need more than one type of medicine for this. The types that work best for people with RAS are: ? ACE inhibitors. ? Angiotensin receptor   blockers.  Reduce fluid in the body (diuretics).  Lower your cholesterol (statins). If medicine is not enough to control RAS, you may need surgery. This may involve:  Threading a tube with an inflatable balloon into the renal artery to force it to open (angioplasty).  Removing plaque from inside the artery (endarterectomy). Follow these instructions at home:  Lifestyle  Make any lifestyle changes recommended by your health care provider. This may include: ? Working with  a dietitian to maintain a heart-healthy diet. This type of diet is low in saturated fat, salt, and added sugar. ? Starting an exercise program as directed by your health care provider. ? Maintaining a healthy weight. ? Quitting smoking. ? Not abusing alcohol. General instructions  Take over-the-counter and prescription medicines only as told by your health care provider.  Keep all follow-up visits as told by your health care provider. This is important. Contact a health care provider if:  Your symptoms of RAS are not getting better.  Your symptoms are changing or getting worse. Get help right away if you have:  Very bad pain in your back or abdomen.  Blood in your urine. Summary  Renal artery stenosis (RAS) is a narrowing of the artery that carries blood to the kidneys. It can affect one or both kidneys.  RAS usually develops slowly. You may not have any signs or symptoms at first, but high blood pressure that is difficult to control is a key symptom.  Making lifestyle changes to reduce your risk factors is the first treatment option for early RAS. If the blood flow to one of your kidneys is cut by more than half, you may need medicines to help manage your cholesterol and blood pressure. This information is not intended to replace advice given to you by your health care provider. Make sure you discuss any questions you have with your health care provider. Document Released: 01/21/2005 Document Revised: 05/24/2017 Document Reviewed: 05/24/2017 Elsevier Interactive Patient Education  2019 [employer]  

## 2018-08-30 NOTE — Assessment & Plan Note (Signed)
Renal artery duplex today shows a patent right renal artery stent with no elevated velocities and a stable kidney length of 9.3 cm which is slightly larger than her last study.  Continue current regimen of Plavix and statin agent.  We will plan to recheck a renal artery duplex in 6 months or if problems develop in the interim.

## 2018-09-08 ENCOUNTER — Other Ambulatory Visit: Payer: Self-pay

## 2018-09-08 DIAGNOSIS — K259 Gastric ulcer, unspecified as acute or chronic, without hemorrhage or perforation: Secondary | ICD-10-CM

## 2018-10-20 ENCOUNTER — Other Ambulatory Visit: Payer: Self-pay | Admitting: Neurology

## 2018-10-20 ENCOUNTER — Other Ambulatory Visit (HOSPITAL_COMMUNITY): Payer: Self-pay | Admitting: Neurology

## 2018-10-20 DIAGNOSIS — M316 Other giant cell arteritis: Secondary | ICD-10-CM

## 2018-10-27 ENCOUNTER — Other Ambulatory Visit: Payer: Self-pay

## 2018-10-30 ENCOUNTER — Ambulatory Visit
Admission: RE | Admit: 2018-10-30 | Discharge: 2018-10-30 | Disposition: A | Discharge status: Home / Self Care | Payer: Medicare Other | Source: Ambulatory Visit | Attending: Neurology | Admitting: Neurology

## 2018-10-30 ENCOUNTER — Other Ambulatory Visit: Payer: Self-pay

## 2018-10-30 DIAGNOSIS — M316 Other giant cell arteritis: Secondary | ICD-10-CM | POA: Diagnosis present

## 2018-10-31 ENCOUNTER — Telehealth: Payer: Self-pay | Admitting: Gastroenterology

## 2018-10-31 NOTE — Telephone Encounter (Signed)
Patient called & l/m on v/m stating she has an endoscopy scheduled for 11-17-2018 & has questions. She would also like to know if she needs a covid test prior. She has to make transportation arrangements.

## 2018-11-01 ENCOUNTER — Ambulatory Visit: Payer: Self-pay | Admitting: Surgery

## 2018-11-01 NOTE — H&P (Signed)
Subjective:   CC: Chronic intractable headache, unspecified headache type [R51]  HPI:  Tina Curtis is a 85 y.o. female who was referred by Bedelia Person* for evaluation of above. First noted a few months ago, after taking steroids  Symptoms include: Pain is sharp, intermittent, localized to left hemisphere.  Exacerbated by nothing specific.  Alleviated by nothing specific.  Associated with nothing specific.  Recent worsening of vision bilaterally, required new prescription.  Headache continues even having stopped taking steroids  For some time now     Past Medical History:  has a past medical history of Allergic state, Arthritis, Cataract cortical, senile, Chicken pox, Gout, Hypertension, and Osteoporosis, post-menopausal.  Past Surgical History:  has a past surgical history that includes Hysterectomy (2000) and Cataract extraction.  Family History: family history includes Alzheimer's disease in her sister; Breast cancer in her daughter and sister; Diabetes type I in her daughter; Heart disease in her brother and sister; High blood pressure (Hypertension) in her father, mother, and sister; Osteoporosis (Thinning of bones) in her mother; Stroke in her father and paternal grandfather.  Social History:  reports that she has never smoked. She has never used smokeless tobacco. No history on file for alcohol and drug.  Current Medications: has a current medication list which includes the following prescription(s): allopurinol, amlodipine, antiox.mv no.[id number], aspirin, baclofen, bifidobacterium infantis, cholecalciferol, citalopram, clopidogrel, docusate, duloxetine, furosemide, gabapentin, hydralazine, hydrocodone-acetaminophen, loratadine, meclizine, metoprolol succinate, pantoprazole, polyethylene glycol, and simvastatin.  Allergies:      Allergies  Allergen Reactions  . Penicillin Anaphylaxis    ROS:  A 15 point review of systems was performed and pertinent  positives and negatives noted in HPI   Objective:   BP 162/77   Pulse 68   Ht 161.3 cm (5' 3.5")   Wt 65.8 kg (145 lb)   BMI 25.28 kg/m   Constitutional :  alert, appears stated age, cooperative and no distress  Lymphatics/Throat:  no asymmetry, masses, or scars  Respiratory:  clear to auscultation bilaterally  Cardiovascular:  regular rate and rhythm  Gastrointestinal: soft, non-tender; bowel sounds normal; no masses,  no organomegaly.    Musculoskeletal: Steady gait and movement  Skin: Cool and moist.  Bilateral palpable temporal arteries near hair line  Psychiatric: Normal affect, non-agitated, not confused       LABS:  n/a   RADS: Pending MRI  Assessment:      Chronic intractable headache, unspecified headache type [R51]  Plan:   1. Chronic intractable headache, unspecified headache type [R51] Discussed biopsy.  Alternatives include continued observation.  Benefits include pathologic evaluation,  Discussed the risk of surgery including post-op infxn, poor cosmesis, poor/delayed wound healing, and possible re-operation to address said risks. The risks of general anesthetic, if used, includes MI, CVA, sudden death or even reaction to anesthetic medications also discussed.  Typical post-op recovery time of 3-5 days with possible activity restrictions were also discussed.  The patient verbalized understanding and all questions were answered to the patient's satisfaction.  2. Pain onset and symptoms not typical for arteritis, but she does have slightly increased ESR and workup so far negative.  MRI negative, so will proceed with biopsy.  plavix to stop one week prior to procedure date      Electronically signed by Benjamine Sprague, DO on 10/27/2018 7:29 PM

## 2018-11-01 NOTE — Telephone Encounter (Signed)
Patient called today & l/m stating she wants to speak to Dr Marius Ditch.

## 2018-11-02 NOTE — Telephone Encounter (Signed)
Patient called & will need to cancel her endoscopy for 11-17-18 she's having temporal bx. She will call & r/s once things settle done.

## 2018-11-02 NOTE — Telephone Encounter (Signed)
Pt left vm she states she has tried calling several days now without response she would like to discuss with Dr. Marius Ditch to delay her procedure due to having another procedure scheduled please call pt

## 2018-11-09 NOTE — Telephone Encounter (Signed)
Procedures have been cancelled and pt has been notified and verbalized understanding

## 2018-11-11 ENCOUNTER — Inpatient Hospital Stay: Admission: RE | Admit: 2018-11-11 | Payer: Medicare Other | Source: Ambulatory Visit

## 2018-11-15 ENCOUNTER — Encounter: Admission: RE | Payer: Self-pay | Source: Home / Self Care

## 2018-11-15 ENCOUNTER — Ambulatory Visit: Admission: RE | Admit: 2018-11-15 | Payer: Medicare Other | Source: Home / Self Care | Admitting: Surgery

## 2018-11-15 SURGERY — BIOPSY TEMPORAL ARTERY
Anesthesia: General | Laterality: Left

## 2018-11-17 ENCOUNTER — Ambulatory Visit: Admission: RE | Admit: 2018-11-17 | Payer: Medicare Other | Source: Home / Self Care | Admitting: Gastroenterology

## 2018-11-17 ENCOUNTER — Encounter: Admission: RE | Payer: Self-pay | Source: Home / Self Care

## 2018-11-17 SURGERY — ESOPHAGOGASTRODUODENOSCOPY (EGD) WITH PROPOFOL
Anesthesia: General

## 2018-11-24 ENCOUNTER — Ambulatory Visit: Payer: Medicare Other | Admitting: Gastroenterology

## 2019-01-25 ENCOUNTER — Other Ambulatory Visit: Payer: Self-pay

## 2019-01-25 ENCOUNTER — Ambulatory Visit (INDEPENDENT_AMBULATORY_CARE_PROVIDER_SITE_OTHER): Payer: Medicare Other | Admitting: Podiatry

## 2019-01-25 ENCOUNTER — Encounter: Payer: Self-pay | Admitting: Podiatry

## 2019-01-25 ENCOUNTER — Ambulatory Visit (INDEPENDENT_AMBULATORY_CARE_PROVIDER_SITE_OTHER): Payer: Medicare Other

## 2019-01-25 DIAGNOSIS — M79676 Pain in unspecified toe(s): Secondary | ICD-10-CM | POA: Diagnosis not present

## 2019-01-25 DIAGNOSIS — M779 Enthesopathy, unspecified: Secondary | ICD-10-CM

## 2019-01-25 DIAGNOSIS — B351 Tinea unguium: Secondary | ICD-10-CM | POA: Diagnosis not present

## 2019-01-25 DIAGNOSIS — M778 Other enthesopathies, not elsewhere classified: Secondary | ICD-10-CM

## 2019-01-25 DIAGNOSIS — Q828 Other specified congenital malformations of skin: Secondary | ICD-10-CM

## 2019-01-25 NOTE — Progress Notes (Signed)
Subjective:  Patient ID: Tina Curtis, female    [name]: [birth year],  MRN: [id number] HPI Chief Complaint  Patient presents with  . Foot Pain    Patient presents today for right hallux, 1st mpj joint pain x 6 months after taking a fall.  She reports she has constant aching, stiff pains and worse when walking.  She also c/o nail fungus left toes and painful corns and callouses bilat feet    85 y.o. female presents with the above complaint.   ROS: Denies fever chills nausea vomiting muscle aches pains calf pain back pain chest pain shortness of breath and headache.  Past Medical History:  Diagnosis Date  . Arthritis    Gout  . Diarrhea   . Hypercholesteremia   . Hypertension   . Neuropathy    feet and lower legs  . Seasonal allergies   . Skin cancer    face   Past Surgical History:  Procedure Laterality Date  . ABDOMINAL HYSTERECTOMY  2000  . APPENDECTOMY    . CATARACT EXTRACTION Left   . CATARACT EXTRACTION W/PHACO Right 10/24/2014   Procedure: CATARACT EXTRACTION PHACO AND INTRAOCULAR LENS PLACEMENT (IOC);  Surgeon: Leandrew Koyanagi, MD;  Location: [place];  Service: Ophthalmology;  Laterality: Right;  . ESOPHAGOGASTRODUODENOSCOPY N/A 03/07/2018   Procedure: ESOPHAGOGASTRODUODENOSCOPY (EGD);  Surgeon: Lin Landsman, MD;  Location[facility] ENDOSCOPY;  Service: Gastroenterology;  Laterality: N/A;  . RENAL ANGIOGRAPHY Right 04/11/2018   Procedure: RENAL ANGIOGRAPHY;  Surgeon: Algernon Huxley, MD;  Location: [place] [facility];  Service: Cardiovascular;  Laterality: Right;    Current Outpatient Medications:  .  acetaminophen (TYLENOL) 500 MG tablet, Take 500 mg by mouth every 6 (six) hours as needed., Disp: , Rfl:  .  allopurinol (ZYLOPRIM) 100 MG tablet, , Disp: , Rfl:  .  allopurinol (ZYLOPRIM) 300 MG tablet, Take 300 mg by mouth daily. AM, Disp: , Rfl:  .  amLODipine (NORVASC) 5 MG tablet, , Disp: , Rfl: 1 .  aspirin EC 81 MG tablet, Take 81 mg by mouth  daily., Disp: , Rfl:  .  Baclofen 5 MG TABS, TK 1 T PO BID, Disp: , Rfl:  .  Cholecalciferol (VITAMIN D PO), Take by mouth., Disp: , Rfl:  .  citalopram (CELEXA) 20 MG tablet, Take by mouth., Disp: , Rfl:  .  clopidogrel (PLAVIX) 75 MG tablet, Take 75 mg by mouth daily., Disp: , Rfl:  .  docusate sodium (COLACE) 100 MG capsule, Take 1 capsule (100 mg total) by mouth 2 (two) times daily., Disp: 60 capsule, Rfl: 0 .  DULoxetine (CYMBALTA) 60 MG capsule, , Disp: , Rfl:  .  feeding supplement, GLUCERNA SHAKE, (GLUCERNA SHAKE) LIQD, Take 237 mLs by mouth 3 (three) times daily between meals. (Patient not taking: Reported on 08/23/2018), Disp: 30 Can, Rfl: 0 .  furosemide (LASIX) 40 MG tablet, Take 20 mg by mouth daily., Disp: , Rfl:  .  gabapentin (NEURONTIN) 300 MG capsule, Take 300 mg by mouth at bedtime. , Disp: , Rfl:  .  hydrALAZINE (APRESOLINE) 25 MG tablet, TAKE 1 TABLET PO BID, Disp: , Rfl:  .  hydrALAZINE (APRESOLINE) 50 MG tablet, Take 1 tablet (50 mg total) by mouth 2 (two) times daily., Disp: 30 tablet, Rfl: 0 .  HYDROcodone-acetaminophen (NORCO/VICODIN) 5-325 MG tablet, TK 1/2 T PO BID PRN, Disp: , Rfl:  .  loratadine (CLARITIN) 10 MG tablet, Take 10 mg by mouth daily as needed for allergies. AM, Disp: ,  Rfl:  .  Meclizine HCl 25 MG CHEW, Chew 1 tablet (25 mg total) by mouth 3 (three) times daily as needed., Disp: 90 each, Rfl: 0 .  metoprolol succinate (TOPROL-XL) 25 MG 24 hr tablet, Take 37.5 mg by mouth at bedtime. , Disp: , Rfl:  .  Multiple Vitamins-Minerals (ICAPS PO), Take by mouth daily., Disp: , Rfl:  .  polyethylene glycol (MIRALAX / GLYCOLAX) packet, Take 17 g by mouth daily as needed., Disp: 60 each, Rfl: 0 .  prednisoLONE acetate (PRED FORTE) 1 % ophthalmic suspension, as needed. , Disp: , Rfl:  .  predniSONE (DELTASONE) 10 MG tablet, , Disp: , Rfl:  .  Probiotic Product (ALIGN PO), Take by mouth. AM, Disp: , Rfl:  .  scopolamine (TRANSDERM-SCOP) 1 MG/3DAYS, PLACE 1 PATCH ONTO  THE SKIN EVERY 3RD DAY, Disp: , Rfl: 0 .  simvastatin (ZOCOR) 20 MG tablet, Take 20 mg by mouth daily. PM, Disp: , Rfl:  .  traMADol (ULTRAM) 50 MG tablet, , Disp: , Rfl:  .  Vitamin D, Ergocalciferol, (DRISDOL) 1.25 MG (50000 UT) CAPS capsule, Take by mouth., Disp: , Rfl:   Allergies  Allergen Reactions  . Penicillins Anaphylaxis   Review of Systems Objective:  There were no vitals filed for this visit.  General: Well developed, nourished, in no acute distress, alert and oriented x3   Dermatological: Skin is warm, dry and supple bilateral. Nails x 10 are well maintained; remaining integument appears unremarkable at this time. There are no open sores, no preulcerative lesions, no rash or signs of infection present.  Toenails are long thick yellow dystrophic-like mycotic painful palpation multiple reactive hyperkeratotic lesions plantar aspect of the bilateral foot.  No open lesions or wounds are noted.  Vascular: Dorsalis Pedis artery and Posterior Tibial artery pedal pulses are 2/4 bilateral with immedate capillary fill time. Pedal hair growth present. No varicosities and no lower extremity edema present bilateral.   Neruologic: Grossly intact via light touch bilateral. Vibratory intact via tuning fork bilateral. Protective threshold with Semmes Wienstein monofilament intact to all pedal sites bilateral. Patellar and Achilles deep tendon reflexes 2+ bilateral. No Babinski or clonus noted bilateral.   Musculoskeletal: No gross boney pedal deformities bilateral. No pain, crepitus, or limitation noted with foot and ankle range of motion bilateral. Muscular strength 5/5 in all groups tested bilateral.  No pain on palpation of the foot.  Gait: Unassisted, Nonantalgic.    Radiographs:  Radiographs taken today of the right foot demonstrate no osseous abnormalities fusion of the first metatarsal phalangeal joint which is her major complaint is intact internal fixation is in good position and  intact.  No acute findings are noted.  Significant osteoarthritis throughout the foot.  Assessment & Plan:   Assessment: Contusion foot right.  Painful elongated toenails and calluses.  Plan: Discussed etiology pathology conservative versus surgical therapies.  Debrided toenails 1 through 5 bilaterally debrided calluses bilaterally.  As needed.     [employer], [place]

## 2019-02-01 DIAGNOSIS — E871 Hypo-osmolality and hyponatremia: Secondary | ICD-10-CM | POA: Insufficient documentation

## 2019-02-01 DIAGNOSIS — I701 Atherosclerosis of renal artery: Secondary | ICD-10-CM | POA: Insufficient documentation

## 2019-02-01 DIAGNOSIS — N2581 Secondary hyperparathyroidism of renal origin: Secondary | ICD-10-CM | POA: Insufficient documentation

## 2019-02-01 DIAGNOSIS — D631 Anemia in chronic kidney disease: Secondary | ICD-10-CM | POA: Insufficient documentation

## 2019-02-01 DIAGNOSIS — N189 Chronic kidney disease, unspecified: Secondary | ICD-10-CM | POA: Insufficient documentation

## 2019-02-01 DIAGNOSIS — I129 Hypertensive chronic kidney disease with stage 1 through stage 4 chronic kidney disease, or unspecified chronic kidney disease: Secondary | ICD-10-CM | POA: Insufficient documentation

## 2019-02-01 DIAGNOSIS — R809 Proteinuria, unspecified: Secondary | ICD-10-CM | POA: Insufficient documentation

## 2019-02-07 ENCOUNTER — Ambulatory Visit (INDEPENDENT_AMBULATORY_CARE_PROVIDER_SITE_OTHER): Payer: Medicare Other

## 2019-02-07 ENCOUNTER — Encounter (INDEPENDENT_AMBULATORY_CARE_PROVIDER_SITE_OTHER): Payer: Self-pay | Admitting: Vascular Surgery

## 2019-02-07 ENCOUNTER — Ambulatory Visit (INDEPENDENT_AMBULATORY_CARE_PROVIDER_SITE_OTHER): Payer: Medicare Other | Admitting: Vascular Surgery

## 2019-02-07 ENCOUNTER — Other Ambulatory Visit: Payer: Self-pay

## 2019-02-07 VITALS — BP 124/67 | HR 79 | Resp 10 | Ht 64.0 in | Wt 138.0 lb

## 2019-02-07 DIAGNOSIS — E7800 Pure hypercholesterolemia, unspecified: Secondary | ICD-10-CM | POA: Diagnosis not present

## 2019-02-07 DIAGNOSIS — I701 Atherosclerosis of renal artery: Secondary | ICD-10-CM

## 2019-02-07 DIAGNOSIS — I150 Renovascular hypertension: Secondary | ICD-10-CM | POA: Diagnosis not present

## 2019-02-07 NOTE — Assessment & Plan Note (Signed)
Duplex today shows normal velocities in the right renal artery stent with no evidence of recurrent stenosis of significance.  The right kidney size remains normal.  The left renal artery shows no appreciable flow and this is an expected finding for her. She is doing well.  We will continue 53-month follow-up intervals due to the high risk situation with a solitary kidney.

## 2019-02-07 NOTE — Progress Notes (Signed)
MRN : [id number]  Tina Curtis is a 85 y.o. (June 22, 1926) female who presents with chief complaint of  Chief Complaint  Patient presents with  . Follow-up  .  History of Present Illness: Patient returns today in follow up of her renal artery stenosis.  She saw her nephrologist last week and her GFR had gone up 4 points.  Her blood pressure is excellent today and she says it has generally been running very good.  Duplex today shows normal velocities in the right renal artery stent with no evidence of recurrent stenosis of significance.  The right kidney size remains normal.  The left renal artery shows no appreciable flow and this is an expected finding for her.  Current Outpatient Medications  Medication Sig Dispense Refill  . acetaminophen (TYLENOL) 500 MG tablet Take 500 mg by mouth every 6 (six) hours as needed.    Marland Kitchen allopurinol (ZYLOPRIM) 100 MG tablet     . amLODipine (NORVASC) 5 MG tablet 2.5 mg.   1  . aspirin EC 81 MG tablet Take 81 mg by mouth daily.    . Baclofen 5 MG TABS TK 1 T PO BID    . Cholecalciferol (VITAMIN D PO) Take by mouth.    . citalopram (CELEXA) 20 MG tablet Take by mouth.    . clopidogrel (PLAVIX) 75 MG tablet Take 75 mg by mouth daily.    Marland Kitchen docusate sodium (COLACE) 100 MG capsule Take 1 capsule (100 mg total) by mouth 2 (two) times daily. 60 capsule 0  . DULoxetine (CYMBALTA) 60 MG capsule     . feeding supplement, GLUCERNA SHAKE, (GLUCERNA SHAKE) LIQD Take 237 mLs by mouth 3 (three) times daily between meals. 30 Can 0  . furosemide (LASIX) 40 MG tablet Take 20 mg by mouth daily.    Marland Kitchen gabapentin (NEURONTIN) 300 MG capsule Take 300 mg by mouth at bedtime.     . hydrALAZINE (APRESOLINE) 50 MG tablet Take 1 tablet (50 mg total) by mouth 2 (two) times daily. 30 tablet 0  . HYDROcodone-acetaminophen (NORCO/VICODIN) 5-325 MG tablet TK 1/2 T PO BID PRN    . loratadine (CLARITIN) 10 MG tablet Take 10 mg by mouth daily as needed for allergies. AM    . metoprolol  succinate (TOPROL-XL) 25 MG 24 hr tablet Take 37.5 mg by mouth at bedtime.     . Multiple Vitamins-Minerals (ICAPS PO) Take by mouth daily.    . polyethylene glycol (MIRALAX / GLYCOLAX) packet Take 17 g by mouth daily as needed. 60 each 0  . Probiotic Product (ALIGN PO) Take by mouth. AM    . simvastatin (ZOCOR) 20 MG tablet Take 20 mg by mouth daily. PM    . Vitamin D, Ergocalciferol, (DRISDOL) 1.25 MG (50000 UT) CAPS capsule Take by mouth.    . hydrALAZINE (APRESOLINE) 25 MG tablet TAKE 1 TABLET PO BID     No current facility-administered medications for this visit.     Past Medical History:  Diagnosis Date  . Arthritis    Gout  . Diarrhea   . Hypercholesteremia   . Hypertension   . Neuropathy    feet and lower legs  . Seasonal allergies   . Skin cancer    face    Past Surgical History:  Procedure Laterality Date  . ABDOMINAL HYSTERECTOMY  2000  . APPENDECTOMY    . CATARACT EXTRACTION Left   . CATARACT EXTRACTION W/PHACO Right 10/24/2014   Procedure: CATARACT EXTRACTION PHACO AND  INTRAOCULAR LENS PLACEMENT (IOC);  Surgeon: Leandrew Koyanagi, MD;  Location: [place];  Service: Ophthalmology;  Laterality: Right;  . ESOPHAGOGASTRODUODENOSCOPY N/A 03/07/2018   Procedure: ESOPHAGOGASTRODUODENOSCOPY (EGD);  Surgeon: Lin Landsman, MD;  Location: [facility] ENDOSCOPY;  Service: Gastroenterology;  Laterality: N/A;  . RENAL ANGIOGRAPHY Right 04/11/2018   Procedure: RENAL ANGIOGRAPHY;  Surgeon: Algernon Huxley, MD;  Location: [place] [facility];  Service: Cardiovascular;  Laterality: Right;    Social History Social History   Tobacco Use  . Smoking status: Never Smoker  . Smokeless tobacco: Never Used  Substance Use Topics  . Alcohol use: No  . Drug use: Never     Family History Family History  Problem Relation Age of Onset  . Hypertension Mother   . Hypertension Father   . Stroke Father     Allergies  Allergen Reactions  . Penicillins Anaphylaxis     REVIEW OF SYSTEMS(Negative unless checked)  Constitutional: [] ??Weight loss[] ??Fever[] ??Chills Cardiac:[] ??Chest pain[] ??Chest pressure[] ??Palpitations [] ??Shortness of breath when laying flat [] ??Shortness of breath at rest [] ??Shortness of breath with exertion. Vascular: [] ??Pain in legs with walking[] ??Pain in legsat rest[] ??Pain in legs when laying flat [] ??Claudication [] ??Pain in feet when walking [] ??Pain in feet at rest [] ??Pain in feet when laying flat [] ??History of DVT [] ??Phlebitis [] ??Swelling in legs [] ??Varicose veins [] ??Non-healing ulcers Pulmonary: [] ??Uses home oxygen [] ??Productive cough[] ??Hemoptysis [] ??Wheeze [] ??COPD [] ??Asthma Neurologic: [] ??Dizziness [] ??Blackouts [] ??Seizures [] ??History of stroke [] ??History of TIA[] ??Aphasia [] ??Temporary blindness[] ??Dysphagia [] ??Weaknessor numbness in arms [x] ??Weakness or numbnessin legs Musculoskeletal: [x] ??Arthritis [] ??Joint swelling [x] ??Joint pain [] ??Low back pain Hematologic:[] ??Easy bruising[] ??Easy bleeding [] ??Hypercoagulable state [] ??Anemic [] ??Hepatitis Gastrointestinal:[] ??Blood in stool[] ??Vomiting blood[] ??Gastroesophageal reflux/heartburn[] ??Abdominal pain Genitourinary: [x] ??Chronic kidney disease [x] ??Difficulturination [] ??Frequenturination [] ??Burning with urination[] ??Hematuria Skin: [] ??Rashes [] ??Ulcers [] ??Wounds Psychological: [] ??History of anxiety[] ??History of major depression.   Physical Examination  BP 124/67 (BP Location: Left Arm, Patient Position: Sitting, Cuff Size: Normal)   Pulse 79   Resp 10   Ht 5\' 4"  (1.626 m)   Wt 138 lb (62.6 kg)   BMI 23.69 kg/m  Gen:  WD/WN, NAD. Appears younger than stated age Head: [place]/AT, No temporalis wasting. Ear/Nose/Throat: Hearing grossly intact, nares w/o erythema or drainage Eyes: Conjunctiva clear. Sclera non-icteric Neck:  Supple.  Trachea midline Pulmonary:  Good air movement, no use of accessory muscles.  Cardiac: RRR, no JVD Vascular:  Vessel Right Left  Radial Palpable Palpable                   Musculoskeletal: M/S 5/5 throughout.  No deformity or atrophy. No edema. Neurologic: Sensation grossly intact in extremities.  Symmetrical.  Speech is fluent.  Psychiatric: Judgment intact, Mood & affect appropriate for pt's clinical situation. Dermatologic: No rashes or ulcers noted.  No cellulitis or open wounds.       Labs No results found for this or any previous visit (from the past 2160 hour(s)).  Radiology Dg Foot Complete Right  Result Date: 01/25/2019 Please see detailed radiograph report in office note.   Assessment/Plan Pure hypercholesterolemia lipid control important in reducing the progression of atherosclerotic disease. Continue statin therapy  Renovascular hypertension Blood pressure control markedly improved after intervention  Renal artery stenosis (HCC) Duplex today shows normal velocities in the right renal artery stent with no evidence of recurrent stenosis of significance.  The right kidney size remains normal.  The left renal artery shows no appreciable flow and this is an expected finding for her. She is doing well.  We will continue 16-month follow-up intervals due to the high risk situation with a solitary  kidney.    Leotis Pain, MD  02/07/2019 10:34 AM    This note was created with Dragon medical transcription system.  Any errors from dictation are purely unintentional

## 2019-02-07 NOTE — Assessment & Plan Note (Signed)
Blood pressure control markedly improved after intervention

## 2019-03-03 ENCOUNTER — Encounter (INDEPENDENT_AMBULATORY_CARE_PROVIDER_SITE_OTHER): Payer: Medicare Other

## 2019-03-03 ENCOUNTER — Ambulatory Visit (INDEPENDENT_AMBULATORY_CARE_PROVIDER_SITE_OTHER): Payer: Medicare Other | Admitting: Vascular Surgery

## 2019-04-08 ENCOUNTER — Other Ambulatory Visit (INDEPENDENT_AMBULATORY_CARE_PROVIDER_SITE_OTHER): Payer: Self-pay | Admitting: Vascular Surgery

## 2019-04-26 ENCOUNTER — Ambulatory Visit: Payer: Medicare Other | Admitting: Podiatry

## 2019-05-01 ENCOUNTER — Other Ambulatory Visit: Payer: Self-pay

## 2019-05-01 ENCOUNTER — Encounter: Payer: Self-pay | Admitting: Podiatry

## 2019-05-01 ENCOUNTER — Ambulatory Visit: Payer: Medicare Other | Admitting: Podiatry

## 2019-05-01 DIAGNOSIS — B351 Tinea unguium: Secondary | ICD-10-CM

## 2019-05-01 DIAGNOSIS — Q828 Other specified congenital malformations of skin: Secondary | ICD-10-CM | POA: Diagnosis not present

## 2019-05-01 DIAGNOSIS — M79676 Pain in unspecified toe(s): Secondary | ICD-10-CM | POA: Diagnosis not present

## 2019-05-01 NOTE — Progress Notes (Signed)
She presents today chief complaint of painfully elongated toenails and calluses.  Objective: Vital signs are stable she is alert and oriented x3.  Pulses are palpable.  Neurologic sensorium is intact toenails are long thick yellow dystrophic-like mycotic large painful reactive hyperkeratotic lesion subfourth metatarsal phalangeal joint area of the right foot.  No open lesions or wounds are noted.  Assessment: Pain in limb secondary to onychomycosis.  Pain in limb secondary to callus.  Porokeratotic lesion.  Plan: Debridement of toenails 1 through 5 bilaterally debridement of reactive hyperkeratotic tissue and placed padding.

## 2019-05-11 ENCOUNTER — Other Ambulatory Visit (INDEPENDENT_AMBULATORY_CARE_PROVIDER_SITE_OTHER): Payer: Self-pay | Admitting: Vascular Surgery

## 2019-07-24 ENCOUNTER — Other Ambulatory Visit: Payer: Self-pay

## 2019-07-24 ENCOUNTER — Ambulatory Visit (INDEPENDENT_AMBULATORY_CARE_PROVIDER_SITE_OTHER): Payer: Medicare PPO | Admitting: Podiatry

## 2019-07-24 ENCOUNTER — Encounter: Payer: Self-pay | Admitting: Podiatry

## 2019-07-24 VITALS — Temp 96.7°F

## 2019-07-24 DIAGNOSIS — M79676 Pain in unspecified toe(s): Secondary | ICD-10-CM

## 2019-07-24 DIAGNOSIS — Q828 Other specified congenital malformations of skin: Secondary | ICD-10-CM

## 2019-07-24 DIAGNOSIS — B351 Tinea unguium: Secondary | ICD-10-CM | POA: Diagnosis not present

## 2019-07-24 NOTE — Progress Notes (Signed)
She presents today for follow-up of her painful elongated toenails and painful callus.  Objective: Vital signs are stable alert oriented x3 painful callus plantar aspect of the right foot x3 and painfully elongated toenails bilaterally.  Pulses remain palpable no open lesions or wounds.  Assessment: Pain in limb secondary to onychomycosis and porokeratotic lesions.  Plan: Debridement of all reactive hyperkeratotic tissue debridement of toenails 1 through 5 bilateral.  Follow-up with her in 3 months.

## 2019-08-08 ENCOUNTER — Ambulatory Visit (INDEPENDENT_AMBULATORY_CARE_PROVIDER_SITE_OTHER): Payer: Medicare Other | Admitting: Vascular Surgery

## 2019-08-08 ENCOUNTER — Other Ambulatory Visit: Payer: Self-pay

## 2019-08-08 ENCOUNTER — Encounter (INDEPENDENT_AMBULATORY_CARE_PROVIDER_SITE_OTHER): Payer: Self-pay | Admitting: Vascular Surgery

## 2019-08-08 ENCOUNTER — Ambulatory Visit (INDEPENDENT_AMBULATORY_CARE_PROVIDER_SITE_OTHER): Payer: Medicare PPO

## 2019-08-08 VITALS — BP 126/67 | HR 69 | Resp 14 | Ht 65.0 in | Wt 140.0 lb

## 2019-08-08 DIAGNOSIS — I150 Renovascular hypertension: Secondary | ICD-10-CM | POA: Diagnosis not present

## 2019-08-08 DIAGNOSIS — E7800 Pure hypercholesterolemia, unspecified: Secondary | ICD-10-CM

## 2019-08-08 DIAGNOSIS — I701 Atherosclerosis of renal artery: Secondary | ICD-10-CM

## 2019-08-08 NOTE — Progress Notes (Signed)
MRN : [id number]  Tina Curtis is a 86 y.o. (05/10/1927) female who presents with chief complaint of  Chief Complaint  Patient presents with  . Follow-up    ultrasound follow up   .  History of Present Illness: Patient returns today in follow up of her renal artery stenosis. She is doing well. No complaints.  BP has been good.  Sees her nephrologist soon to check her renal function.  Her duplex today shows a patent stent in her solitary right renal artery.  Left kidney is not seen and this is chronic.  Current Outpatient Medications  Medication Sig Dispense Refill  . acetaminophen (TYLENOL) 500 MG tablet Take 500 mg by mouth every 6 (six) hours as needed.    Marland Kitchen allopurinol (ZYLOPRIM) 100 MG tablet     . amLODipine (NORVASC) 5 MG tablet 2.5 mg.   1  . aspirin EC 81 MG tablet Take 81 mg by mouth daily.    . Baclofen 5 MG TABS TK 1 T PO BID    . Cholecalciferol (VITAMIN D PO) Take by mouth.    . citalopram (CELEXA) 20 MG tablet Take by mouth.    . clopidogrel (PLAVIX) 75 MG tablet TAKE 1 TABLET BY MOUTH EVERY DAY 30 tablet 1  . DULoxetine (CYMBALTA) 60 MG capsule     . feeding supplement, GLUCERNA SHAKE, (GLUCERNA SHAKE) LIQD Take 237 mLs by mouth 3 (three) times daily between meals. 30 Can 0  . furosemide (LASIX) 40 MG tablet Take 20 mg by mouth daily.    Marland Kitchen gabapentin (NEURONTIN) 300 MG capsule Take 300 mg by mouth at bedtime.     . hydrALAZINE (APRESOLINE) 25 MG tablet TAKE 1 TABLET PO BID    . hydrALAZINE (APRESOLINE) 50 MG tablet Take 1 tablet (50 mg total) by mouth 2 (two) times daily. 30 tablet 0  . HYDROcodone-acetaminophen (NORCO/VICODIN) 5-325 MG tablet TK 1/2 T PO BID PRN    . loratadine (CLARITIN) 10 MG tablet Take 10 mg by mouth daily as needed for allergies. AM    . metoprolol succinate (TOPROL-XL) 25 MG 24 hr tablet Take 37.5 mg by mouth at bedtime.     . Multiple Vitamins-Minerals (ICAPS PO) Take by mouth daily.    . polyethylene glycol (MIRALAX / GLYCOLAX) packet Take  17 g by mouth daily as needed. 60 each 0  . Probiotic Product (ALIGN PO) Take by mouth. AM    . simvastatin (ZOCOR) 20 MG tablet Take 20 mg by mouth daily. PM    . Vitamin D, Ergocalciferol, (DRISDOL) 1.25 MG (50000 UT) CAPS capsule Take by mouth.     No current facility-administered medications for this visit.    Past Medical History:  Diagnosis Date  . Arthritis    Gout  . Diarrhea   . Hypercholesteremia   . Hypertension   . Neuropathy    feet and lower legs  . Seasonal allergies   . Skin cancer    face    Past Surgical History:  Procedure Laterality Date  . ABDOMINAL HYSTERECTOMY  2000  . APPENDECTOMY    . CATARACT EXTRACTION Left   . CATARACT EXTRACTION W/PHACO Right 10/24/2014   Procedure: CATARACT EXTRACTION PHACO AND INTRAOCULAR LENS PLACEMENT (IOC);  Surgeon: Leandrew Koyanagi, MD;  Location: [facility];  Service: Ophthalmology;  Laterality: Right;  . ESOPHAGOGASTRODUODENOSCOPY N/A 03/07/2018   Procedure: ESOPHAGOGASTRODUODENOSCOPY (EGD);  Surgeon: Lin Landsman, MD;  Location: [facility] ENDOS[facility];  Service: Gastroenterology;  Laterality: N/A;  .  RENAL ANGIOGRAPHY Right 04/11/2018   Procedure: RENAL ANGIOGRAPHY;  Surgeon: Algernon Huxley, MD;  Location: [facility];  Service: Cardiovascular;  Laterality: Right;     Social History   Tobacco Use  . Smoking status: Never Smoker  . Smokeless tobacco: Never Used  Substance Use Topics  . Alcohol use: No  . Drug use: Never    Family History  Problem Relation Age of Onset  . Hypertension Mother   . Hypertension Father   . Stroke Father      Allergies  Allergen Reactions  . Penicillins Anaphylaxis     REVIEW OF SYSTEMS(Negative unless checked)  Constitutional: [] ???Weight loss[] ???Fever[] ???Chills Cardiac:[] ???Chest pain[] ???Chest pressure[] ???Palpitations [] ???Shortness of breath when laying flat [] ???Shortness of breath at rest [] ???Shortness of breath with  exertion. Vascular: [] ???Pain in legs with walking[] ???Pain in legsat rest[] ???Pain in legs when laying flat [] ???Claudication [] ???Pain in feet when walking [] ???Pain in feet at rest [] ???Pain in feet when laying flat [] ???History of DVT [] ???Phlebitis [] ???Swelling in legs [] ???Varicose veins [] ???Non-healing ulcers Pulmonary: [] ???Uses home oxygen [] ???Productive cough[] ???Hemoptysis [] ???Wheeze [] ???COPD [] ???Asthma Neurologic: [] ???Dizziness [] ???Blackouts [] ???Seizures [] ???History of stroke [] ???History of TIA[] ???Aphasia [] ???Temporary blindness[] ???Dysphagia [] ???Weaknessor numbness in arms [x] ???Weakness or numbnessin legs Musculoskeletal: [x] ???Arthritis [] ???Joint swelling [x] ???Joint pain [] ???Low back pain Hematologic:[] ???Easy bruising[] ???Easy bleeding [] ???Hypercoagulable state [] ???Anemic [] ???Hepatitis Gastrointestinal:[] ???Blood in stool[] ???Vomiting blood[] ???Gastroesophageal reflux/heartburn[] ???Abdominal pain Genitourinary: [x] ???Chronic kidney disease [x] ???Difficulturination [] ???Frequenturination [] ???Burning with urination[] ???Hematuria Skin: [] ???Rashes [] ???Ulcers [] ???Wounds Psychological: [] ???History of anxiety[] ???History of major depression.  Physical Examination  BP 126/67 (BP Location: Right Arm)   Pulse 69   Resp 14   Ht 5\' 5"  (1.651 m)   Wt 140 lb (63.5 kg)   BMI 23.30 kg/m  Gen:  WD/WN, NAD. Appears younger than stated age. Head: [place]/AT, No temporalis wasting. Ear/Nose/Throat: Hearing grossly intact, nares w/o erythema or drainage Eyes: Conjunctiva clear. Sclera non-icteric Neck: Supple.  Trachea midline Pulmonary:  Good air movement, no use of accessory muscles.  Cardiac: RRR, no JVD Vascular:  Vessel Right Left  Radial Palpable Palpable                           Musculoskeletal: M/S 5/5 throughout.  No deformity or atrophy. No  edema. Neurologic: Sensation grossly intact in extremities.  Symmetrical.  Speech is fluent.  Psychiatric: Judgment intact, Mood & affect appropriate for pt's clinical situation. Dermatologic: No rashes or ulcers noted.  No cellulitis or open wounds.       Labs No results found for this or any previous visit (from the past 2160 hour(s)).  Radiology No results found.  Assessment/Plan Pure hypercholesterolemia lipid control important in reducing the progression of atherosclerotic disease. Continue statin therapy  Renovascular hypertension Blood pressure control markedly improved after intervention  Renal artery stenosis (HCC) Her duplex today shows a patent stent in her solitary right renal artery.  Left kidney is not seen and this is chronic. She is doing well.  We will continue to follow this on 6 month intervals with her high risk status of a solitary kidney.     Leotis Pain, MD  08/08/2019 11:18 AM    This note was created with Dragon medical transcription system.  Any errors from dictation are purely unintentional

## 2019-08-08 NOTE — Assessment & Plan Note (Signed)
Her duplex today shows a patent stent in her solitary right renal artery.  Left kidney is not seen and this is chronic. She is doing well.  We will continue to follow this on 6 month intervals with her high risk status of a solitary kidney.

## 2019-08-08 NOTE — Patient Instructions (Signed)
Renal Artery Stenosis Renal artery stenosis (RAS) is a narrowing of the artery that carries blood to the kidneys. It can affect one or both kidneys. The kidneys filter waste and extra fluid from the blood. Waste and fluid are then removed when a person passes urine. The kidneys also make an important chemical messenger (hormone) called renin. Renin helps regulate blood pressure. The first sign of RAS may be high blood pressure. Other symptoms can develop over time. What are the causes? A common cause of this condition is plaque buildup in your arteries (atherosclerosis). The plaques that cause this are made up of:  Fat.  Cholesterol.  Calcium.  Other substances. As these substances build up in your renal artery, the blood supply to your kidneys slows. The lack of blood and oxygen causes the signs and symptoms of RAS. A much less common cause of RAS is a disease called fibromuscular dysplasia. This disease causes abnormal cell growth that narrows the renal artery. It is not related to atherosclerosis. It occurs mostly in women who are 25-50 years old. It may be passed down through families.  What increases the risk? You are more likely to develop this condition if you:  Are a man who is at least 84 years old.  Are a woman who is at least 84 years old.  Have high blood pressure.  Have high cholesterol.  Are a smoker.  Abuse alcohol.  Have diabetes or prediabetes.  Are overweight or obese.  Have a family history of early heart disease. What are the signs or symptoms? RAS usually develops slowly. You may not have any signs or symptoms at first. Early signs may include:  Development of high blood pressure.  A sudden increase in existing high blood pressure.  No longer responding to medicine that used to control your blood pressure. Later signs and symptoms are due to kidney damage. They may include:  Feeling tired (fatigue).  Shortness of breath.  Swollen legs and  feet.  Dry skin.  Headaches.  Muscle cramps.  Loss of appetite.  Nausea or vomiting. How is this diagnosed? This condition may be diagnosed based on:  Your symptoms and medical history. Your health care provider may suspect RAS based on changes in your blood pressure and your risk factors.  A physical exam. During the exam, your health care provider will use a stethoscope to listen for a whooshing sound (bruit) that can occur where the renal artery is blocking blood flow.  Various tests. These may include: ? Blood and urine tests to check your kidney function. ? Imaging tests of your kidneys, such as:  A test that uses sound waves to create an image of your kidneys and the blood flow to your kidneys (ultrasound).  A test in which dye is injected into one of your blood vessels so images can be taken as the dye flows through your renal arteries (angiogram). This can be done using X-rays, a CT scan (computed tomography angiogram, CTA), or a type of MRI (magnetic resonance angiogram, MRA). How is this treated? Making lifestyle changes to reduce your risk factors is the first treatment option for early RAS. If the blood flow to one of your kidneys is cut by more than half, you may need medicine to:  Lower your blood pressure. This is the main medical treatment for RAS. You may need more than one type of medicine for this. The types that work best for people with RAS are: ? ACE inhibitors. ? Angiotensin receptor   blockers.  Reduce fluid in the body (diuretics).  Lower your cholesterol (statins). If medicine is not enough to control RAS, you may need surgery. This may involve:  Threading a tube with an inflatable balloon into the renal artery to force it to open (angioplasty).  Removing plaque from inside the artery (endarterectomy). Follow these instructions at home:  Lifestyle  Make any lifestyle changes recommended by your health care provider. This may include: ? Working with  a dietitian to maintain a heart-healthy diet. This type of diet is low in saturated fat, salt, and added sugar. ? Starting an exercise program as directed by your health care provider. ? Maintaining a healthy weight. ? Quitting smoking. ? Not abusing alcohol. General instructions  Take over-the-counter and prescription medicines only as told by your health care provider.  Keep all follow-up visits as told by your health care provider. This is important. Contact a health care provider if:  Your symptoms of RAS are not getting better.  Your symptoms are changing or getting worse. Get help right away if you have:  Very bad pain in your back or abdomen.  Blood in your urine. Summary  Renal artery stenosis (RAS) is a narrowing of the artery that carries blood to the kidneys. It can affect one or both kidneys.  RAS usually develops slowly. You may not have any signs or symptoms at first, but high blood pressure that is difficult to control is a key symptom.  Making lifestyle changes to reduce your risk factors is the first treatment option for early RAS. If the blood flow to one of your kidneys is cut by more than half, you may need medicines to help manage your cholesterol and blood pressure. This information is not intended to replace advice given to you by your health care provider. Make sure you discuss any questions you have with your health care provider. Document Revised: 05/24/2017 Document Reviewed: 05/24/2017 Elsevier Patient Education  2020 [employer]  

## 2019-08-29 ENCOUNTER — Other Ambulatory Visit: Payer: Self-pay | Admitting: Family Medicine

## 2019-08-29 DIAGNOSIS — N6310 Unspecified lump in the right breast, unspecified quadrant: Secondary | ICD-10-CM

## 2019-09-04 ENCOUNTER — Inpatient Hospital Stay
Admission: RE | Admit: 2019-09-04 | Discharge: 2019-09-04 | Disposition: A | Discharge status: Home / Self Care | Payer: Self-pay | Source: Ambulatory Visit | Attending: *Deleted | Admitting: *Deleted

## 2019-09-04 ENCOUNTER — Other Ambulatory Visit: Payer: Self-pay | Admitting: *Deleted

## 2019-09-04 DIAGNOSIS — Z1231 Encounter for screening mammogram for malignant neoplasm of breast: Secondary | ICD-10-CM

## 2019-09-12 ENCOUNTER — Ambulatory Visit
Admission: RE | Admit: 2019-09-12 | Discharge: 2019-09-12 | Disposition: A | Discharge status: Home / Self Care | Payer: Medicare PPO | Source: Ambulatory Visit | Attending: Family Medicine | Admitting: Family Medicine

## 2019-09-12 DIAGNOSIS — N6310 Unspecified lump in the right breast, unspecified quadrant: Secondary | ICD-10-CM

## 2019-09-12 DIAGNOSIS — N6315 Unspecified lump in the right breast, overlapping quadrants: Secondary | ICD-10-CM | POA: Insufficient documentation

## 2019-09-14 ENCOUNTER — Other Ambulatory Visit: Payer: Self-pay | Admitting: Family Medicine

## 2019-09-14 DIAGNOSIS — R928 Other abnormal and inconclusive findings on diagnostic imaging of breast: Secondary | ICD-10-CM

## 2019-09-19 ENCOUNTER — Ambulatory Visit
Admission: RE | Admit: 2019-09-19 | Discharge: 2019-09-19 | Disposition: A | Discharge status: Home / Self Care | Payer: Medicare PPO | Source: Ambulatory Visit | Attending: Family Medicine | Admitting: Family Medicine

## 2019-09-19 ENCOUNTER — Telehealth (INDEPENDENT_AMBULATORY_CARE_PROVIDER_SITE_OTHER): Payer: Self-pay | Admitting: Vascular Surgery

## 2019-09-19 DIAGNOSIS — R928 Other abnormal and inconclusive findings on diagnostic imaging of breast: Secondary | ICD-10-CM | POA: Diagnosis present

## 2019-09-19 HISTORY — PX: BREAST BIOPSY: SHX20

## 2019-09-19 NOTE — Telephone Encounter (Signed)
Olivia Mackie called from [facility] on behalf of Dr. Shon Hale. Dr. Owens Shark is requesting to speak with Dr. Lucky Cowboy or Nurse about Patient Tina Curtis. Please advise.

## 2019-09-19 NOTE — Telephone Encounter (Signed)
Dr Lucky Cowboy will be giving information to contact Dr Owens Shark

## 2019-09-25 LAB — SURGICAL PATHOLOGY

## 2019-09-27 ENCOUNTER — Inpatient Hospital Stay: Payer: Medicare PPO | Attending: Internal Medicine | Admitting: Internal Medicine

## 2019-09-27 ENCOUNTER — Encounter: Payer: Self-pay | Admitting: Internal Medicine

## 2019-09-27 ENCOUNTER — Other Ambulatory Visit: Payer: Self-pay

## 2019-09-27 ENCOUNTER — Inpatient Hospital Stay: Payer: Medicare PPO

## 2019-09-27 DIAGNOSIS — Z7902 Long term (current) use of antithrombotics/antiplatelets: Secondary | ICD-10-CM | POA: Insufficient documentation

## 2019-09-27 DIAGNOSIS — C50211 Malignant neoplasm of upper-inner quadrant of right female breast: Secondary | ICD-10-CM

## 2019-09-27 DIAGNOSIS — I10 Essential (primary) hypertension: Secondary | ICD-10-CM | POA: Insufficient documentation

## 2019-09-27 DIAGNOSIS — Z7982 Long term (current) use of aspirin: Secondary | ICD-10-CM | POA: Diagnosis not present

## 2019-09-27 DIAGNOSIS — N183 Chronic kidney disease, stage 3 unspecified: Secondary | ICD-10-CM | POA: Diagnosis not present

## 2019-09-27 DIAGNOSIS — Z79899 Other long term (current) drug therapy: Secondary | ICD-10-CM | POA: Diagnosis not present

## 2019-09-27 DIAGNOSIS — Z170 Estrogen receptor positive status [ER+]: Secondary | ICD-10-CM | POA: Diagnosis not present

## 2019-09-27 LAB — CBC WITH DIFFERENTIAL/PLATELET
Abs Immature Granulocytes: 0.03 10*3/uL (ref 0.00–0.07)
Basophils Absolute: 0.1 10*3/uL (ref 0.0–0.1)
Basophils Relative: 1 %
Eosinophils Absolute: 0.4 10*3/uL (ref 0.0–0.5)
Eosinophils Relative: 5 %
HCT: 41.3 % (ref 36.0–46.0)
Hemoglobin: 13.6 g/dL (ref 12.0–15.0)
Immature Granulocytes: 0 %
Lymphocytes Relative: 29 %
Lymphs Abs: 2.2 10*3/uL (ref 0.7–4.0)
MCH: 30.7 pg (ref 26.0–34.0)
MCHC: 32.9 g/dL (ref 30.0–36.0)
MCV: 93.2 fL (ref 80.0–100.0)
Monocytes Absolute: 0.8 10*3/uL (ref 0.1–1.0)
Monocytes Relative: 11 %
Neutro Abs: 4.2 10*3/uL (ref 1.7–7.7)
Neutrophils Relative %: 54 %
Platelets: 198 10*3/uL (ref 150–400)
RBC: 4.43 MIL/uL (ref 3.87–5.11)
RDW: 13.3 % (ref 11.5–15.5)
WBC: 7.7 10*3/uL (ref 4.0–10.5)
nRBC: 0 % (ref 0.0–0.2)

## 2019-09-27 LAB — COMPREHENSIVE METABOLIC PANEL
ALT: 19 U/L (ref 0–44)
AST: 27 U/L (ref 15–41)
Albumin: 4.6 g/dL (ref 3.5–5.0)
Alkaline Phosphatase: 114 U/L (ref 38–126)
Anion gap: 10 (ref 5–15)
BUN: 51 mg/dL — ABNORMAL HIGH (ref 8–23)
CO2: 27 mmol/L (ref 22–32)
Calcium: 9.7 mg/dL (ref 8.9–10.3)
Chloride: 104 mmol/L (ref 98–111)
Creatinine, Ser: 1.75 mg/dL — ABNORMAL HIGH (ref 0.44–1.00)
GFR calc Af Amer: 29 mL/min — ABNORMAL LOW (ref >60)
GFR calc non Af Amer: 25 mL/min — ABNORMAL LOW (ref >60)
Glucose, Bld: 96 mg/dL (ref 70–99)
Potassium: 4.8 mmol/L (ref 3.5–5.1)
Sodium: 141 mmol/L (ref 135–145)
Total Bilirubin: 0.6 mg/dL (ref 0.3–1.2)
Total Protein: 8.2 g/dL — ABNORMAL HIGH (ref 6.5–8.1)

## 2019-09-27 NOTE — Progress Notes (Signed)
Patient here for initial oncology appointment, expresses no complaints or concerns at this time.   

## 2019-09-27 NOTE — Progress Notes (Signed)
MDC 

## 2019-09-27 NOTE — Assessment & Plan Note (Addendum)
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  Ultrasound-guided lymph node biopsy positive for malignancy.  Right lateral breast DCIS.   # I had a long discussion with the patient in general regarding the treatment options of breast cancer including-surgery; adjuvant radiation; role of adjuvant systemic therapy including-chemotherapy antihormone therapy.   #Patient will likely need mastectomy; and axial lymph node dissection-given the positive lymph nodes.  Discussed the role of radiation based upon pathology.  Given patient's age/chronic kidney disease-patient is unlikely a good chemotherapy candidate.  Given the ER/PR status patient would be candidate for antihormone therapy.  # CKD- Stage-III- IV [Dr.Kolluru]  # Thank you Dr.Baboff for allowing me to participate in the care of your pleasant patient. Please do not hesitate to contact me with questions or concerns in the interim.  Discussed with Dr. Tresa Res agrees to evaluate the patient tomorrow.  Also discussed with breast navigator; will reach out to the patient this afternoon.  # DISPOSITION: # refer to Dr.Byrnett re: Breast cancer # labs- cbc/cmp/ca-27-29;cea; ca-15-3 # follow up TBD- Dr.B  Cc;Dr.Baboff

## 2019-09-27 NOTE — Progress Notes (Signed)
[facility] [place] NOTE  Patient Care Team: Tina Late, MD as PCP - General (Family Medicine)  CHIEF COMPLAINTS/PURPOSE OF CONSULTATION: Breast cancer  #  Oncology History Overview Note  A. RIGHT BREAST, 3:00; ULTRASOUND-GUIDED BIOPSY:  - INVASIVE MAMMARY CARCINOMA WITH MICROPAPILLARY FEATURES; Size of invasive carcinoma: 67m in this sample  Histologic grade of invasive carcinoma: Grade 2 ; ER/PR > 905: her 2- NEG  A. RIGHT BREAST, 3:00; ULTRASOUND-GUIDED BIOPSY: - INVASIVE MAMMARY CARCINOMA WITH MICROPAPILLARY FEATURES. 142min this sample. Grade 2. Lymphovascular invasion: Present.   B. LYMPH NODE, RIGHT AXILLARY; ULTRASOUND-GUIDED NEEDLE CORE BIOPSY: - METASTATIC MAMMARY CARCINOMA, 6 MM IN THIS SAMPLE.   C. RIGHT BREAST, LATERAL CALCIFICATIONS; STEREOTACTIC BIOPSY: - DUCTAL CARCINOMA IN SITU, INTERMEDIATE GRADE, WITH CALCIFICATIONS. -------------------------------------------------------------   # CKD- Stage III-IV [Dr.Kolluru]  # SURVIVORSHIP:   # GENETICS:   DIAGNOSIS:   STAGE:         ;  GOALS:  CURRENT/MOST RECENT THERAPY :     Carcinoma of upper-inner quadrant of right breast in female, estrogen receptor positive ([name] 09/27/2019 Initial Diagnosis   Carcinoma of upper-inner quadrant of right breast in female, estrogen receptor positive ([name]     HISTORY OF PRESENTING ILLNESS:  Tina ZACARIAS49.o.  female female with no prior history of breast cancer/or malignancies has been referred to [place] further evaluation recommendations for new diagnosis of breast cancer.  Patient noted to have a lump in the breast while in shower few days ago which led to diagnostic mammogram/ultrasound/followed by biopsy-as summarized above.  Patient denies any unusual shortness of breath or cough.  Any bone pain.  Denies any chest pain.  She walks with a cane; uses walker at home.   Review of Systems  Constitutional: Negative for chills, diaphoresis, fever,  malaise/fatigue and weight loss.  HENT: Negative for nosebleeds and sore throat.   Eyes: Negative for double vision.  Respiratory: Negative for cough, hemoptysis, sputum production, shortness of breath and wheezing.   Cardiovascular: Negative for chest pain, palpitations, orthopnea and leg swelling.  Gastrointestinal: Negative for abdominal pain, blood in stool, constipation, diarrhea, heartburn, melena, nausea and vomiting.  Genitourinary: Negative for dysuria, frequency and urgency.  Musculoskeletal: Positive for back pain. Negative for joint pain.  Skin: Negative.  Negative for itching and rash.  Neurological: Negative for dizziness, tingling, focal weakness, weakness and headaches.  Endo/Heme/Allergies: Does not bruise/bleed easily.  Psychiatric/Behavioral: Negative for depression. The patient is not nervous/anxious and does not have insomnia.      MEDICAL HISTORY:  Past Medical History:  Diagnosis Date  . Arthritis    Gout  . Chronic kidney disease   . Diarrhea   . Hypercholesteremia   . Hypertension   . Neuropathy    feet and lower legs  . Seasonal allergies   . Skin cancer    face    SURGICAL HISTORY: Past Surgical History:  Procedure Laterality Date  . ABDOMINAL HYSTERECTOMY  2000  . APPENDECTOMY    . BREAST BIOPSY Right 09/19/2019   affirm bx of calcs UOQ, x marker, path pending  . BREAST BIOPSY Right 09/19/2019   [place] of mass,heart marker, path pending  . BREAST BIOPSY Right 09/19/2019   [place] of LN, coil marker, path pending  . CATARACT EXTRACTION Left   . CATARACT EXTRACTION W/PHACO Right 10/24/2014   Procedure: CATARACT EXTRACTION PHACO AND INTRAOCULAR LENS PLACEMENT (IOC);  Surgeon: ChLeandrew KoyanagiMD;  [facility]  Service: Ophthalmology;  Laterality: Right;  . ESOPHAGOGASTRODUODENOSCOPY N/A 03/07/2018   Procedure: ESOPHAGOGASTRODUODENOSCOPY (EGD);  Surgeon: Lin Landsman, MD;  Location: [facility] ENDOSCOPY;  Service:  Gastroenterology;  Laterality: N/A;  . RENAL ANGIOGRAPHY Right 04/11/2018   Procedure: RENAL ANGIOGRAPHY;  Surgeon: Algernon Huxley, MD;  Location: [facility];  Service: Cardiovascular;  Laterality: Right;    SOCIAL HISTORY: Social History   Socioeconomic History  . Marital status: Widowed    Spouse name: Not on file  . Number of children: Not on file  . Years of education: Not on file  . Highest education level: Not on file  Occupational History  . Not on file  Tobacco Use  . Smoking status: Never Smoker  . Smokeless tobacco: Never Used  Substance and Sexual Activity  . Alcohol use: No  . Drug use: Never  . Sexual activity: Not on file  Other Topics Concern  . Not on file  Social History Narrative   2 daughters; lives in [place]; Pharmacist, 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    Social Determinants of Health   Financial Resource Strain:   . Difficulty of Paying Living Expenses:   Food Insecurity:   . Worried About Charity fundraiser in the Last Year:   . Arboriculturist in the Last Year:   Transportation Needs:   . Film/video editor (Medical):   Marland Kitchen Lack of Transportation (Non-Medical):   Physical Activity:   . Days of Exercise per Week:   . Minutes of Exercise per Session:   Stress:   . Feeling of Stress :   Social Connections:   . Frequency of Communication with Friends and Family:   . Frequency of Social Gatherings with Friends and Family:   . Attends Religious Services:   . Active Member of Clubs or Organizations:   . Attends Archivist Meetings:   Marland Kitchen Marital Status:   Intimate Partner Violence:   . Fear of Current or Ex-Partner:   . Emotionally Abused:   Marland Kitchen Physically Abused:   . Sexually Abused:     FAMILY HISTORY: Family History  Problem Relation Age of Onset  . Hypertension Mother   . Hypertension Father   . Stroke Father   . Breast cancer Daughter 82    ALLERGIES:  is allergic to penicillins and drug  ingredient [zinc].  MEDICATIONS:  Current Outpatient Medications  Medication Sig Dispense Refill  . allopurinol (ZYLOPRIM) 100 MG tablet     . amLODipine (NORVASC) 5 MG tablet 2.5 mg.   1  . aspirin EC 81 MG tablet Take 81 mg by mouth daily.    . Baclofen 5 MG TABS TK 1 T PO BID    . citalopram (CELEXA) 20 MG tablet Take by mouth.    . clopidogrel (PLAVIX) 75 MG tablet TAKE 1 TABLET BY MOUTH EVERY DAY 30 tablet 1  . DULoxetine (CYMBALTA) 60 MG capsule     . furosemide (LASIX) 40 MG tablet Take 20 mg by mouth daily.    Marland Kitchen gabapentin (NEURONTIN) 300 MG capsule Take 300 mg by mouth at bedtime.     . hydrALAZINE (APRESOLINE) 50 MG tablet Take 1 tablet (50 mg total) by mouth 2 (two) times daily. 30 tablet 0  . HYDROcodone-acetaminophen (NORCO/VICODIN) 5-325 MG tablet TK 1/2 T PO BID PRN    . metoprolol succinate (TOPROL-XL) 25 MG 24 hr tablet Take 37.5 mg by mouth at bedtime.     Marland Kitchen  simvastatin (ZOCOR) 20 MG tablet Take 20 mg by mouth daily. PM    . Cholecalciferol (VITAMIN D PO) Take by mouth.    . feeding supplement, GLUCERNA SHAKE, (GLUCERNA SHAKE) LIQD Take 237 mLs by mouth 3 (three) times daily between meals. (Patient not taking: Reported on 09/27/2019) 30 Can 0  . hydrALAZINE (APRESOLINE) 25 MG tablet TAKE 1 TABLET PO BID    . loratadine (CLARITIN) 10 MG tablet Take 10 mg by mouth daily as needed for allergies. AM    . Multiple Vitamins-Minerals (ICAPS PO) Take by mouth daily.    . polyethylene glycol (MIRALAX / GLYCOLAX) packet Take 17 g by mouth daily as needed. (Patient not taking: Reported on 09/27/2019) 60 each 0  . Probiotic Product (ALIGN PO) Take by mouth. AM    . Vitamin D, Ergocalciferol, (DRISDOL) 1.25 MG (50000 UT) CAPS capsule Take by mouth.     No current facility-administered medications for this visit.      Marland Kitchen  PHYSICAL EXAMINATION: ECOG PERFORMANCE STATUS: 0 - Asymptomatic  Vitals:   09/27/19 1116  BP: 117/70  Pulse: 70  Resp: 18  Temp: 98.1 F (36.7 C)  SpO2:  98%   Filed Weights   09/27/19 1116  Weight: 142 lb 9.6 oz (64.7 kg)    Physical Exam  Constitutional: She is oriented to person, place, and time and well-developed, well-nourished, and in no distress.  Walks with a cane.  Alone.  HENT:  Head: Normocephalic and atraumatic.  Mouth/Throat: Oropharynx is clear and moist. No oropharyngeal exudate.  Eyes: Pupils are equal, round, and reactive to light.  Cardiovascular: Normal rate and regular rhythm.  Pulmonary/Chest: Effort normal and breath sounds normal. No respiratory distress. She has no wheezes.  Abdominal: Soft. Bowel sounds are normal. She exhibits no distension and no mass. There is no abdominal tenderness. There is no rebound and no guarding.  Musculoskeletal:        General: No tenderness or edema. Normal range of motion.     Cervical back: Normal range of motion and neck supple.  Neurological: She is alert and oriented to person, place, and time.  Skin: Skin is warm.  Right BREAST exam (in the presence of nurse)-right breast 3 o'clock position ecchymosis noted 3 to 4 cm mobile mass noted.  No other skin changes or nipple changes.  Left breast- no unusual skin changes or dominant masses felt.    Psychiatric: Affect normal.   LABORATORY DATA:  I have reviewed the data as listed Lab Results  Component Value Date   WBC 7.7 09/27/2019   HGB 13.6 09/27/2019   HCT 41.3 09/27/2019   MCV 93.2 09/27/2019   PLT 198 09/27/2019   Recent Labs    09/27/19 1240  NA 141  K 4.8  CL 104  CO2 27  GLUCOSE 96  BUN 51*  CREATININE 1.75*  CALCIUM 9.7  GFRNONAA 25*  GFRAA 29*  PROT 8.2*  ALBUMIN 4.6  AST 27  ALT 19  ALKPHOS 114  BILITOT 0.6    RADIOGRAPHIC STUDIES: I have personally reviewed the radiological images as listed and agreed with the findings in the report. US BREAST LTD UNI RIGHT INC AXILLA  Result Date: 09/12/2019 CLINICAL DATA:  Patient presents for evaluation of palpable abnormality within the medial right  breast. EXAM: DIGITAL DIAGNOSTIC BILATERAL MAMMOGRAM WITH CAD AND TOMO ULTRASOUND RIGHT BREAST COMPARISON:  Previous exam(s). ACR Breast Density Category d: The breast tissue is extremely dense, which lowers the sensitivity of mammography.  FINDINGS: Within the lower inner right breast middle depth there is an irregular mass with associated architectural distortion underlying the palpable marker. Magnification views demonstrate a new 8 mm group of coarse heterogeneous calcifications within the outer right breast middle depth, demonstrated best on the cc view. No additional concerning findings within the right or left breast. Mammographic images were processed with CAD. Targeted ultrasound is performed, showing a 1.4 x 1.7 x 0.9 cm irregular hypoechoic mass right breast 3 o'clock position 5 cm from the nipple at the site of palpable concern. There are at least 2 cortically thickened right axillary lymph nodes, the largest measures 9 mm in cortical thickness. IMPRESSION: 1. Suspicious palpable right breast mass 3 o'clock position. 2. Suspicious coarse heterogeneous calcifications outer right breast. 3. Multiple cortically thickened right axillary lymph nodes. RECOMMENDATION: 1. Ultrasound-guided core needle biopsy palpable right breast mass 3 o'clock position. 2. Stereotactic guided core needle biopsy outer right breast calcifications. 3. Ultrasound-guided core needle biopsy of a cortically thickened right axillary lymph node. I have discussed the findings and recommendations with the patient. If applicable, a reminder letter will be sent to the patient regarding the next appointment. BI-RADS CATEGORY  5: Highly suggestive of malignancy. Electronically Signed   By: Lovey Newcomer M.D.   On: 09/12/2019 15:24   MM DIAG BREAST TOMO BILATERAL  Result Date: 09/12/2019 CLINICAL DATA:  Patient presents for evaluation of palpable abnormality within the medial right breast. EXAM: DIGITAL DIAGNOSTIC BILATERAL MAMMOGRAM WITH CAD  AND TOMO ULTRASOUND RIGHT BREAST COMPARISON:  Previous exam(s). ACR Breast Density Category d: The breast tissue is extremely dense, which lowers the sensitivity of mammography. FINDINGS: Within the lower inner right breast middle depth there is an irregular mass with associated architectural distortion underlying the palpable marker. Magnification views demonstrate a new 8 mm group of coarse heterogeneous calcifications within the outer right breast middle depth, demonstrated best on the cc view. No additional concerning findings within the right or left breast. Mammographic images were processed with CAD. Targeted ultrasound is performed, showing a 1.4 x 1.7 x 0.9 cm irregular hypoechoic mass right breast 3 o'clock position 5 cm from the nipple at the site of palpable concern. There are at least 2 cortically thickened right axillary lymph nodes, the largest measures 9 mm in cortical thickness. IMPRESSION: 1. Suspicious palpable right breast mass 3 o'clock position. 2. Suspicious coarse heterogeneous calcifications outer right breast. 3. Multiple cortically thickened right axillary lymph nodes. RECOMMENDATION: 1. Ultrasound-guided core needle biopsy palpable right breast mass 3 o'clock position. 2. Stereotactic guided core needle biopsy outer right breast calcifications. 3. Ultrasound-guided core needle biopsy of a cortically thickened right axillary lymph node. I have discussed the findings and recommendations with the patient. If applicable, a reminder letter will be sent to the patient regarding the next appointment. BI-RADS CATEGORY  5: Highly suggestive of malignancy. Electronically Signed   By: Lovey Newcomer M.D.   On: 09/12/2019 15:24   MM CLIP PLACEMENT RIGHT  Result Date: 09/19/2019 CLINICAL DATA:  Status post ultrasound guided core biopsy of mass in the 3 o'clock location, ultrasound biopsy of RIGHT axillary lymph node, and stereotactic guided core biopsy of calcifications the LOWER portion of the  RIGHT. EXAM: DIAGNOSTIC RIGHT MAMMOGRAM POST ULTRASOUND BIOPSY X2 AND STEREOTACTIC BIOPSY X1 COMPARISON:  Previous exam(s). FINDINGS: Mammographic images were obtained following ultrasound guided biopsy of mass in the 3 o'clock location of the RIGHT breast and placement of a heart shaped clip. The biopsy marking clip is in  expected position at the site of biopsy. Following biopsy of enlarged RIGHT axillary lymph node, a spiral shaped HydroMARK clip was placed and is identified in the appropriate location. Following stereotactic guided core biopsy of calcifications the LOWER portion of the RIGHT breast, an X shaped clip was placed and is identified in the expected location. IMPRESSION: Tissue marker clips are in the expected locations after biopsy. Final Assessment: Post Procedure Mammograms for Marker Placement Electronically Signed   By: Nolon Nations M.D.   On: 09/19/2019 10:20   MM RT BREAST BX W LOC DEV 1ST LESION IMAGE BX SPEC STEREO GUIDE  Addendum Date: 09/21/2019   ADDENDUM REPORT: 09/21/2019 13:55 ADDENDUM: PATHOLOGY revealed: A. RIGHT BREAST, 3:00; ULTRASOUND-GUIDED BIOPSY: - INVASIVE MAMMARY CARCINOMA WITH MICROPAPILLARY FEATURES. 70m in this sample. Grade 2. Lymphovascular invasion: Present. B. LYMPH NODE, RIGHT AXILLARY; ULTRASOUND-GUIDED NEEDLE CORE BIOPSY: - METASTATIC MAMMARY CARCINOMA, 6 MM IN THIS SAMPLE. C. RIGHT BREAST, LATERAL CALCIFICATIONS; STEREOTACTIC BIOPSY: - DUCTAL CARCINOMA IN SITU, INTERMEDIATE GRADE, WITH CALCIFICATIONS. Pathology results are CONCORDANT with imaging findings, per Dr. ENolon Nations At provider's request, pathology results and recommendations below were discussed with patient by Dr. MDerinda Lateon 09/20/2019. Recommendation: Surgical and oncology referral. [place] RN from provider office stated the provider will call patient with results and coordinate patient's surgical and oncology referrals. Addendum by LElecta SniffRN on 09/21/2019. Electronically Signed    By: ENolon NationsM.D.   On: 09/21/2019 13:55   Result Date: 09/21/2019 CLINICAL DATA:  Patient presents for stereotactic guided core biopsy of RIGHT breast calcifications. EXAM: RIGHT BREAST STEREOTACTIC CORE NEEDLE BIOPSY COMPARISON:  Previous exams. FINDINGS: The patient and I discussed the procedure of stereotactic-guided biopsy including benefits and alternatives. We discussed the high likelihood of a successful procedure. We discussed the risks of the procedure including infection, bleeding, tissue injury, clip migration, and inadequate sampling. Informed written consent was given. The usual time out protocol was performed immediately prior to the procedure. Using sterile technique and 1% lidocaine and 1% lidocaine with epinephrine as local anesthetic, under stereotactic guidance, a 9 gauge vacuum assisted device was used to perform core needle biopsy of calcifications in the UPPER-OUTER QUADRANT of the RIGHT breast using a craniocaudal approach. Specimen radiograph was performed showing calcifications in numerous tissue samples. Specimens with calcifications are identified for pathology. Lesion quadrant: UPPER-OUTER QUADRANT RIGHT breast At the conclusion of the procedure, X shaped tissue marker clip was deployed into the biopsy cavity. Follow-up 2-view mammogram was performed and dictated separately. IMPRESSION: Stereotactic-guided biopsy of RIGHT breast calcifications. No apparent complications. Patient stepped taking Plavix and aspirin 7 days prior to procedure, and discussed with Dr. DLucky Cowboy Anticoagulation will be restarted. Electronically Signed: By: ENolon NationsM.D. On: 09/19/2019 11:43   [place] BREAST BX W LOC DEV 1ST LESION IMG BX SPEC [place]GUIDE  Addendum Date: 09/21/2019   ADDENDUM REPORT: 09/21/2019 13:51 ADDENDUM: PATHOLOGY revealed: A. RIGHT BREAST, 3:00; ULTRASOUND-GUIDED BIOPSY: - INVASIVE MAMMARY CARCINOMA WITH MICROPAPILLARY FEATURES. 162min this sample. Grade 2. Lymphovascular  invasion: Present. B. LYMPH NODE, RIGHT AXILLARY; ULTRASOUND-GUIDED NEEDLE CORE BIOPSY: - METASTATIC MAMMARY CARCINOMA, 6 MM IN THIS SAMPLE. C. RIGHT BREAST, LATERAL CALCIFICATIONS; STEREOTACTIC BIOPSY: - DUCTAL CARCINOMA IN SITU, INTERMEDIATE GRADE, WITH CALCIFICATIONS. Pathology results are CONCORDANT with imaging findings, per Dr. ElNolon NationsAt provider's request, pathology results and recommendations below were discussed with patient by Dr. MaDerinda Laten 09/20/2019. Recommendation: Surgical and oncology referral. [place] RN from provider office stated the provider will call  patient with results and coordinate patient's surgical and oncology referrals. Addendum by Electa Sniff RN on 09/21/2019. Electronically Signed   By: Nolon Nations M.D.   On: 09/21/2019 13:51   Result Date: 09/21/2019 CLINICAL DATA:  Patient presents for ultrasound-guided core biopsy of mass in the 3 o'clock location of the RIGHT breast and enlarged lymph node in the RIGHT axilla. EXAM: ULTRASOUND GUIDED RIGHT BREAST CORE NEEDLE BIOPSY x2 COMPARISON:  Previous exam(s). PROCEDURE: I met with the patient and we discussed the procedure of ultrasound-guided biopsy, including benefits and alternatives. We discussed the high likelihood of a successful procedure. We discussed the risks of the procedure, including infection, bleeding, tissue injury, clip migration, and inadequate sampling. Informed written consent was given. The usual time-out protocol was performed immediately prior to the procedure. Biopsy site 1: 3 o'clock location of the RIGHT breast. Lesion QUADRANT: MEDIAL RIGHT breast mass, heart shaped clip Using sterile technique and 1% Lidocaine as local anesthetic, under direct ultrasound visualization, a 12 gauge spring-loaded device was used to perform biopsy of mass in the 3 o'clock location of the RIGHT breast using a inferior to superior approach. At the conclusion of the procedure heart shaped tissue marker clip was  deployed into the biopsy cavity. Biopsy site 2: Enlarged RIGHT axillary lymph node. Lesion QUADRANT: RIGHT axilla, spiral shaped HydroMARK clip Using sterile technique and 1% Lidocaine as local anesthetic, under direct ultrasound visualization, a 14 gauge spring-loaded device was used to perform biopsy of enlarged RIGHT axillary using a LATERAL to MEDIAL approach. At the conclusion of the procedure spiral shaped HydroMARK tissue marker clip was deployed into the biopsy cavity. Follow up 2 view mammogram was performed and dictated separately. IMPRESSION: Ultrasound guided biopsy of mass in the 3 o'clock location of the RIGHT breast and enlarged RIGHT axillary lymph node. No apparent complications. Patient stepped taking Plavix and aspirin 7 days prior to procedure, discussed with Dr. Lucky Cowboy. Plavix and baby aspirin should be restarted at this time. Patient is aware. Electronically Signed: By: Nolon Nations M.D. On: 09/19/2019 11:46   [place] RT BREAST BX W LOC DEV EA ADD LESION IMG BX SPEC US GUIDE  Addendum Date: 09/21/2019   ADDENDUM REPORT: 09/21/2019 13:51 ADDENDUM: PATHOLOGY revealed: A. RIGHT BREAST, 3:00; ULTRASOUND-GUIDED BIOPSY: - INVASIVE MAMMARY CARCINOMA WITH MICROPAPILLARY FEATURES. 75m in this sample. Grade 2. Lymphovascular invasion: Present. B. LYMPH NODE, RIGHT AXILLARY; ULTRASOUND-GUIDED NEEDLE CORE BIOPSY: - METASTATIC MAMMARY CARCINOMA, 6 MM IN THIS SAMPLE. C. RIGHT BREAST, LATERAL CALCIFICATIONS; STEREOTACTIC BIOPSY: - DUCTAL CARCINOMA IN SITU, INTERMEDIATE GRADE, WITH CALCIFICATIONS. Pathology results are CONCORDANT with imaging findings, per Dr. ENolon Nations At provider's request, pathology results and recommendations below were discussed with patient by Dr. MDerinda Lateon 09/20/2019. Recommendation: Surgical and oncology referral. [place] RN from provider office stated the provider will call patient with results and coordinate patient's surgical and oncology referrals. Addendum by LElecta SniffRN on 09/21/2019. Electronically Signed   By: ENolon NationsM.D.   On: 09/21/2019 13:51   Result Date: 09/21/2019 CLINICAL DATA:  Patient presents for ultrasound-guided core biopsy of mass in the 3 o'clock location of the RIGHT breast and enlarged lymph node in the RIGHT axilla. EXAM: ULTRASOUND GUIDED RIGHT BREAST CORE NEEDLE BIOPSY x2 COMPARISON:  Previous exam(s). PROCEDURE: I met with the patient and we discussed the procedure of ultrasound-guided biopsy, including benefits and alternatives. We discussed the high likelihood of a successful procedure. We discussed the risks of the procedure, including infection, bleeding, tissue injury,  clip migration, and inadequate sampling. Informed written consent was given. The usual time-out protocol was performed immediately prior to the procedure. Biopsy site 1: 3 o'clock location of the RIGHT breast. Lesion QUADRANT: MEDIAL RIGHT breast mass, heart shaped clip Using sterile technique and 1% Lidocaine as local anesthetic, under direct ultrasound visualization, a 12 gauge spring-loaded device was used to perform biopsy of mass in the 3 o'clock location of the RIGHT breast using a inferior to superior approach. At the conclusion of the procedure heart shaped tissue marker clip was deployed into the biopsy cavity. Biopsy site 2: Enlarged RIGHT axillary lymph node. Lesion QUADRANT: RIGHT axilla, spiral shaped HydroMARK clip Using sterile technique and 1% Lidocaine as local anesthetic, under direct ultrasound visualization, a 14 gauge spring-loaded device was used to perform biopsy of enlarged RIGHT axillary using a LATERAL to MEDIAL approach. At the conclusion of the procedure spiral shaped HydroMARK tissue marker clip was deployed into the biopsy cavity. Follow up 2 view mammogram was performed and dictated separately. IMPRESSION: Ultrasound guided biopsy of mass in the 3 o'clock location of the RIGHT breast and enlarged RIGHT axillary lymph node. No apparent  complications. Patient stepped taking Plavix and aspirin 7 days prior to procedure, discussed with Dr. Lucky Cowboy. Plavix and baby aspirin should be restarted at this time. Patient is aware. Electronically Signed: By: Nolon Nations M.D. On: 09/19/2019 11:46   MM Outside Films Mammo  Result Date: 09/05/2019 This examination belongs to an outside facility and is stored here for comparison purposes only.  Contact the originating outside institution for any associated report or interpretation.  MM Outside Films Mammo  Result Date: 09/05/2019 This examination belongs to an outside facility and is stored here for comparison purposes only.  Contact the originating outside institution for any associated report or interpretation.  MM Outside Films Mammo  Result Date: 09/05/2019 This examination belongs to an outside facility and is stored here for comparison purposes only.  Contact the originating outside institution for any associated report or interpretation.  [facility] Mammo  Result Date: 09/05/2019 This examination belongs to an outside facility and is stored here for comparison purposes only.  Contact the originating outside institution for any associated report or interpretation.   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  Ultrasound-guided lymph node biopsy positive for malignancy.  Right lateral breast DCIS.   # I had a long discussion with the patient in general regarding the treatment options of breast cancer including-surgery; adjuvant radiation; role of adjuvant systemic therapy including-chemotherapy antihormone therapy.   #Patient will likely need mastectomy; and axial lymph node dissection-given the positive lymph nodes.  Discussed the role of radiation based upon pathology.  Given patient's age/chronic  kidney disease-patient is unlikely a good chemotherapy candidate.  Given the ER/PR status patient would be candidate for antihormone therapy.  # CKD- Stage-III- IV [Dr.Kolluru]  # Thank you Dr.Baboff for allowing me to participate in the care of your pleasant patient. Please do not hesitate to contact me with questions or concerns in the interim.  Discussed with Dr. Tresa Res agrees to evaluate the patient tomorrow.  Also discussed with breast navigator; will reach out to the patient this afternoon.  # DISPOSITION: # refer to Dr.Byrnett re: Breast cancer # labs- cbc/cmp/ca-27-29;cea; ca-15-3 # follow up TBD- Dr.B  Cc;Dr.Baboff  All questions were answered. The patient/family knows to  call the clinic with any problems, questions or concerns.     Cammie Sickle, MD 09/27/2019 5:00 PM

## 2019-09-28 ENCOUNTER — Telehealth: Payer: Self-pay | Admitting: Internal Medicine

## 2019-09-28 ENCOUNTER — Other Ambulatory Visit: Payer: Self-pay | Admitting: Internal Medicine

## 2019-09-28 DIAGNOSIS — C50211 Malignant neoplasm of upper-inner quadrant of right female breast: Secondary | ICD-10-CM

## 2019-09-28 DIAGNOSIS — Z170 Estrogen receptor positive status [ER+]: Secondary | ICD-10-CM

## 2019-09-28 LAB — CEA: CEA: 5.1 ng/mL — ABNORMAL HIGH (ref 0.0–4.7)

## 2019-09-28 LAB — CANCER ANTIGEN 15-3: CA 15-3: 121 U/mL — ABNORMAL HIGH (ref 0.0–25.0)

## 2019-09-28 LAB — CANCER ANTIGEN 27.29: CA 27.29: 154.9 U/mL — ABNORMAL HIGH (ref 0.0–38.6)

## 2019-09-28 NOTE — Telephone Encounter (Signed)
On 5/20-discussed with Dr. Tollie Pizza; given elevated tumor markers recommend PET scan.   I left a message for the patient to call us back to discuss the PET scan/next treatment options.

## 2019-09-28 NOTE — Progress Notes (Signed)
Initiated navigation.  Patient reports PET scan pending, and she is experiencing some anxiety over this.  Information collected for multidisciplinary clinic. Encouraged to call with any questions or concerns.

## 2019-09-28 NOTE — Progress Notes (Signed)
On 5/20-spoke with Dr. Tollie Pizza regarding elevated tumor markers; recommend a PET scan ASAP.  Patient meeting with Dr. Tollie Pizza this morning.   I will reach out to the patient later in the afternoon to discuss further.    C-schedule PET scan ASAP; follow-up appointment with me 1-2 days later.

## 2019-10-01 ENCOUNTER — Telehealth: Payer: Self-pay | Admitting: Internal Medicine

## 2019-10-01 NOTE — Telephone Encounter (Signed)
On 5/21-spoke to patient regarding the plan for a PET scan.  Patient's daughter in town from [place] for short duration.  Patient is interested in having the PET scan done sooner.  Currently scheduled for 5/27.  Anne/Sheena-please contact nuc med department to see the PET scan to be moved any sooner 5/25 or 5/26.  Patient really wants to have her daughter around to have discussion/results of the PET scan.

## 2019-10-02 ENCOUNTER — Telehealth: Payer: Self-pay | Admitting: Internal Medicine

## 2019-10-02 NOTE — Telephone Encounter (Signed)
C- please schedule appt at 11; on 5/27; MD; no labs.  Webb Silversmith- please inform pt of above new appt.

## 2019-10-03 NOTE — Telephone Encounter (Signed)
I left patient a message with appointment date/time.

## 2019-10-04 ENCOUNTER — Encounter
Admission: RE | Admit: 2019-10-04 | Discharge: 2019-10-04 | Disposition: A | Discharge status: Home / Self Care | Payer: Medicare PPO | Source: Ambulatory Visit | Attending: Internal Medicine | Admitting: Internal Medicine

## 2019-10-04 ENCOUNTER — Other Ambulatory Visit: Payer: Self-pay

## 2019-10-04 DIAGNOSIS — I2510 Atherosclerotic heart disease of native coronary artery without angina pectoris: Secondary | ICD-10-CM | POA: Diagnosis not present

## 2019-10-04 DIAGNOSIS — Z170 Estrogen receptor positive status [ER+]: Secondary | ICD-10-CM | POA: Insufficient documentation

## 2019-10-04 DIAGNOSIS — I77811 Abdominal aortic ectasia: Secondary | ICD-10-CM | POA: Diagnosis not present

## 2019-10-04 DIAGNOSIS — I700 Atherosclerosis of aorta: Secondary | ICD-10-CM | POA: Diagnosis not present

## 2019-10-04 DIAGNOSIS — C50211 Malignant neoplasm of upper-inner quadrant of right female breast: Secondary | ICD-10-CM | POA: Diagnosis not present

## 2019-10-04 DIAGNOSIS — J439 Emphysema, unspecified: Secondary | ICD-10-CM | POA: Insufficient documentation

## 2019-10-04 DIAGNOSIS — N261 Atrophy of kidney (terminal): Secondary | ICD-10-CM | POA: Insufficient documentation

## 2019-10-04 LAB — GLUCOSE, CAPILLARY: Glucose-Capillary: 97 mg/dL (ref 70–99)

## 2019-10-04 MED ORDER — FLUDEOXYGLUCOSE F - 18 (FDG) INJECTION
7.7600 | Freq: Once | INTRAVENOUS | Status: AC | PRN
  Administered 2019-10-04: 7.76 via INTRAVENOUS

## 2019-10-05 ENCOUNTER — Encounter: Payer: Self-pay | Admitting: Internal Medicine

## 2019-10-05 ENCOUNTER — Inpatient Hospital Stay (HOSPITAL_BASED_OUTPATIENT_CLINIC_OR_DEPARTMENT_OTHER): Payer: Medicare PPO | Admitting: Internal Medicine

## 2019-10-05 DIAGNOSIS — Z170 Estrogen receptor positive status [ER+]: Secondary | ICD-10-CM | POA: Diagnosis not present

## 2019-10-05 DIAGNOSIS — C50211 Malignant neoplasm of upper-inner quadrant of right female breast: Secondary | ICD-10-CM

## 2019-10-05 NOTE — Assessment & Plan Note (Addendum)
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 #Right breast-DCIS-s/p biopsy.  #Left breast-approximately 1 cm-based on PET; no biopsy needed.  #May 2021-PET scan shows right breast malignancy; positive axillary lymph node; positive subpectoral lymph nodes.  Left breast-positive for malignancy; no lymphadenopathy.  No evidence of distant site disease.  #Discussed multiple options for mostly surgical; which at the end of the discussion leaning towards right mastectomy; lymph node dissection.  Left lumpectomy; sentinel lymph node biopsy.  Patient is sent that she will need postmastectomy radiation given the positive lymph nodes.  This was discussed with Dr. Tollie Pizza.  #With regards to systemic therapy-patient not a candidate for adjuvant chemotherapy-given her age/overall frailty/chronic kidney disease.  Patient is a good candidate for adjuvant endocrine therapy.  # CKD- Stage-III- IV [Dr.Kolluru]  I spoke at length with the patient's daughter regarding the patient's clinical status/plan of care.  Daughter in agreement.  Also discussed with breast navigator.  # DISPOSITION: # follow up TBD- Dr.B  Cc;Dr.Baboff  # I reviewed the blood work- with the patient in detail; also reviewed the imaging independently [as summarized above]; and with the patient in detail.

## 2019-10-05 NOTE — Progress Notes (Signed)
one [place] NOTE  Patient Care Team: Derinda Late, MD as PCP - General (Family Medicine) Theodore Demark, RN as Oncology Nurse Navigator  CHIEF COMPLAINTS/PURPOSE OF CONSULTATION: Breast cancer  #  Oncology History Overview Note  A. RIGHT BREAST, 3:00; ULTRASOUND-GUIDED BIOPSY:  - INVASIVE MAMMARY CARCINOMA WITH MICROPAPILLARY FEATURES; Size of invasive carcinoma: 51m in this sample  Histologic grade of invasive carcinoma: Grade 2 ; ER/PR > 905: her 2- NEG  A. RIGHT BREAST, 3:00; ULTRASOUND-GUIDED BIOPSY: - INVASIVE MAMMARY CARCINOMA WITH MICROPAPILLARY FEATURES. 168min this sample. Grade 2. Lymphovascular invasion: Present.   B. LYMPH NODE, RIGHT AXILLARY; ULTRASOUND-GUIDED NEEDLE CORE BIOPSY: - METASTATIC MAMMARY CARCINOMA, 6 MM IN THIS SAMPLE.   C. RIGHT BREAST, LATERAL CALCIFICATIONS; STEREOTACTIC BIOPSY: - DUCTAL CARCINOMA IN SITU, INTERMEDIATE GRADE, WITH CALCIFICATIONS. -------------------------------------------------------------   # CKD- Stage III-IV [Dr.Kolluru]  # SURVIVORSHIP:   # GENETICS:   DIAGNOSIS:   STAGE:         ;  GOALS:  CURRENT/MOST RECENT THERAPY :     Carcinoma of upper-inner quadrant of right breast in female, estrogen receptor positive ([facility] 09/27/2019 Initial Diagnosis   Carcinoma of upper-inner quadrant of right breast in female, estrogen receptor positive ([facility]     HISTORY OF PRESENTING ILLNESS:  Tina MCFARLANE86.o.  female new diagnosis of right breast cancer; right breast DCIS-is here to review the results of the PET scan.  In the interim patient has met with Dr. BuTollie Pizzasurgery.   Patient denies any worsening lumps or bumps.  Denies any headaches but denies any nausea vomiting.  Chronic joint pains not any worse.  Review of Systems  Constitutional: Negative for chills, diaphoresis, fever, malaise/fatigue and weight loss.  HENT: Negative for nosebleeds and sore throat.   Eyes: Negative for double  vision.  Respiratory: Negative for cough, hemoptysis, sputum production, shortness of breath and wheezing.   Cardiovascular: Negative for chest pain, palpitations, orthopnea and leg swelling.  Gastrointestinal: Negative for abdominal pain, blood in stool, constipation, diarrhea, heartburn, melena, nausea and vomiting.  Genitourinary: Negative for dysuria, frequency and urgency.  Musculoskeletal: Positive for back pain. Negative for joint pain.  Skin: Negative.  Negative for itching and rash.  Neurological: Negative for dizziness, tingling, focal weakness, weakness and headaches.  Endo/Heme/Allergies: Does not bruise/bleed easily.  Psychiatric/Behavioral: Negative for depression. The patient is not nervous/anxious and does not have insomnia.      MEDICAL HISTORY:  Past Medical History:  Diagnosis Date  . Arthritis    Gout  . Chronic kidney disease   . Diarrhea   . Hypercholesteremia   . Hypertension   . Neuropathy    feet and lower legs  . Seasonal allergies   . Skin cancer    face    SURGICAL HISTORY: Past Surgical History:  Procedure Laterality Date  . ABDOMINAL HYSTERECTOMY  2000  . APPENDECTOMY    . BREAST BIOPSY Right 09/19/2019   affirm bx of calcs UOQ, x marker, path pending  . BREAST BIOPSY Right 09/19/2019   us[place]ax of mass,heart marker, path pending  . BREAST BIOPSY Right 09/19/2019   us[place]oreax of LN, coil marker, path pending  . CATARACT EXTRACTION Left   . CATARACT EXTRACTION W/PHACO Right 10/24/2014   Procedure: CATARACT EXTRACTION PHACO AND INTRAOCULAR LENS PLACEMENT (IOC);  Surgeon: ChLeandrew KoyanagiMD;  [facility] Service: Ophthalmology;  Laterality: Right;  . ESOPHAGOGASTRODUODENOSCOPY N/A 03/07/2018   Procedure: ESOPHAGOGASTRODUODENOSCOPY (EGD);  Surgeon:  Lin Landsman, MD;  Location: [facility];  Service: Gastroenterology;  Laterality: N/A;  . RENAL ANGIOGRAPHY Right 04/11/2018   Procedure: RENAL ANGIOGRAPHY;  Surgeon: Algernon Huxley, MD;  Location: [facility];  Service: Cardiovascular;  Laterality: Right;    SOCIAL HISTORY: Social History   Socioeconomic History  . Marital status: Widowed    Spouse name: Not on file  . Number of children: Not on file  . Years of education: Not on file  . Highest education level: Not on file  Occupational History  . Not on file  Tobacco Use  . Smoking status: Never Smoker  . Smokeless tobacco: Never Used  Substance and Sexual Activity  . Alcohol use: No  . Drug use: Never  . Sexual activity: Not on file  Other Topics Concern  . Not on file  Social History Narrative   2 daughters; lives in [place]; Pharmacist, 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    Social Determinants of Health   Financial Resource Strain:   . Difficulty of Paying Living Expenses:   Food Insecurity:   . Worried About Charity fundraiser in the Last Year:   . Arboriculturist in the Last Year:   Transportation Needs:   . Film/video editor (Medical):   Marland Kitchen Lack of Transportation (Non-Medical):   Physical Activity:   . Days of Exercise per Week:   . Minutes of Exercise per Session:   Stress:   . Feeling of Stress :   Social Connections:   . Frequency of Communication with Friends and Family:   . Frequency of Social Gatherings with Friends and Family:   . Attends Religious Services:   . Active Member of Clubs or Organizations:   . Attends Archivist Meetings:   Marland Kitchen Marital Status:   Intimate Partner Violence:   . Fear of Current or Ex-Partner:   . Emotionally Abused:   Marland Kitchen Physically Abused:   . Sexually Abused:     FAMILY HISTORY: Family History  Problem Relation Age of Onset  . Hypertension Mother   . Hypertension Father   . Stroke Father   . Breast cancer Daughter 81    ALLERGIES:  is allergic to penicillins and drug ingredient [zinc].  MEDICATIONS:  Current Outpatient Medications  Medication Sig Dispense Refill  . allopurinol  (ZYLOPRIM) 300 MG tablet Take 300 mg by mouth daily.     Marland Kitchen amLODipine (NORVASC) 5 MG tablet Take 2.5 mg by mouth daily.   1  . aspirin EC 81 MG tablet Take 81 mg by mouth daily.    . Baclofen 5 MG TABS Take 2.5 mg by mouth in the morning and at bedtime.     . Cholecalciferol (VITAMIN D PO) Take 1 tablet by mouth daily as needed (winter months).     . citalopram (CELEXA) 10 MG tablet Take 10 mg by mouth daily.     . clopidogrel (PLAVIX) 75 MG tablet TAKE 1 TABLET BY MOUTH EVERY DAY 30 tablet 1  . docusate sodium (COLACE) 100 MG capsule Take 100 mg by mouth daily as needed for mild constipation.    . DULoxetine (CYMBALTA) 60 MG capsule Take 60 mg by mouth daily.     . furosemide (LASIX) 20 MG tablet Take 20 mg by mouth daily.     Marland Kitchen gabapentin (NEURONTIN) 100 MG capsule Take 100 mg by mouth at bedtime.     . hydrALAZINE (APRESOLINE) 50 MG tablet Take  1 tablet (50 mg total) by mouth 2 (two) times daily. 30 tablet 0  . lidocaine-prilocaine (EMLA) cream Apply 1 application topically as needed (to breast).    Marland Kitchen loratadine (CLARITIN) 10 MG tablet Take 10 mg by mouth daily as needed for allergies. AM    . metoprolol succinate (TOPROL-XL) 25 MG 24 hr tablet Take 37.5 mg by mouth at bedtime.     . Multiple Vitamins-Minerals (ICAPS PO) Take 1 capsule by mouth in the morning and at bedtime.     . polyethylene glycol (MIRALAX / GLYCOLAX) packet Take 17 g by mouth daily as needed. 60 each 0  . simvastatin (ZOCOR) 20 MG tablet Take 20 mg by mouth every evening. PM      No current facility-administered medications for this visit.      Marland Kitchen  PHYSICAL EXAMINATION: ECOG PERFORMANCE STATUS: 0 - Asymptomatic  Vitals:   10/05/19 1115  BP: 120/72  Pulse: 69  Temp: (!) 97.5 F (36.4 C)  SpO2: 97%   Filed Weights   10/05/19 1115  Weight: 143 lb (64.9 kg)    Physical Exam  Constitutional: She is oriented to person, place, and time and well-developed, well-nourished, and in no distress.  Walks with a  cane.with daughter.   HENT:  Head: Normocephalic and atraumatic.  Mouth/Throat: Oropharynx is clear and moist. No oropharyngeal exudate.  Eyes: Pupils are equal, round, and reactive to light.  Cardiovascular: Normal rate and regular rhythm.  Pulmonary/Chest: Effort normal and breath sounds normal. No respiratory distress. She has no wheezes.  Abdominal: Soft. Bowel sounds are normal. She exhibits no distension and no mass. There is no abdominal tenderness. There is no rebound and no guarding.  Musculoskeletal:        General: No tenderness or edema. Normal range of motion.     Cervical back: Normal range of motion and neck supple.  Neurological: She is alert and oriented to person, place, and time.  Skin: Skin is warm.     Psychiatric: Affect normal.   LABORATORY DATA:  I have reviewed the data as listed Lab Results  Component Value Date   WBC 7.7 09/27/2019   HGB 13.6 09/27/2019   HCT 41.3 09/27/2019   MCV 93.2 09/27/2019   PLT 198 09/27/2019   Recent Labs    09/27/19 1240  NA 141  K 4.8  CL 104  CO2 27  GLUCOSE 96  BUN 51*  CREATININE 1.75*  CALCIUM 9.7  GFRNONAA 25*  GFRAA 29*  PROT 8.2*  ALBUMIN 4.6  AST 27  ALT 19  ALKPHOS 114  BILITOT 0.6    RADIOGRAPHIC STUDIES: I have personally reviewed the radiological images as listed and agreed with the findings in the report. NM PET Image Initial (PI) Skull Base To Thigh  Result Date: 10/04/2019 CLINICAL DATA:  Initial treatment strategy for carcinoma of upper inner quadrant of right breast. Status post right breast and axillary biopsy. EXAM: NUCLEAR MEDICINE PET SKULL BASE TO THIGH TECHNIQUE: 7.8 mCi F-18 FDG was injected intravenously. Full-ring PET imaging was performed from the skull base to thigh after the radiotracer. CT data was obtained and used for attenuation correction and anatomic localization. Fasting blood glucose: 97 mg/dl COMPARISON:  Abdominal MRI of 04/13/2018. 03/05/2018 abdominopelvic CT. FINDINGS:  Mediastinal blood pool activity: SUV max 2.2 Liver activity: SUV max NA NECK: No areas of abnormal hypermetabolism. Incidental CT findings: Bilateral carotid atherosclerosis. No cervical adenopathy. CHEST: Right subpectoral hypermetabolic adenopathy. Example at 9 mm and a S.U.V.  max of 5.0 on 70/3. Hypermetabolic right axillary nodes, including at 1.0 cm and a S.U.V. max of 3.1 on 89/3. Medial right breast hypermetabolic lesion measures 2.2 cm and a S.U.V. max of 8.4 on 115/3. A focus of hypermetabolism within the 6 o'clock position of the left breast corresponds to a subtle nodule of 11 mm and a S.U.V. max of 2.7 on 126/3. No pulmonary parenchymal hypermetabolism. Incidental CT findings: Aortic atherosclerosis. Mild cardiomegaly. Lad coronary artery calcification. Centrilobular emphysema. ABDOMEN/PELVIS: No abdominopelvic parenchymal or nodal hypermetabolism. Incidental CT findings: Moderate left renal atrophy. Normal adrenal glands. Old granulomatous disease in the liver. Infrarenal abdominal aortic ectasia at on the order of 3.3 x 3.1 cm. Hysterectomy. SKELETON: No abnormal marrow activity. Incidental CT findings: Osteopenia. IMPRESSION: 1. Right breast primary with axillary and subpectoral nodal metastasis. 2. Inferior left breast hypermetabolic nodule, suspicious for a contralateral breast primary. 3. Incidental findings, including: Aortic atherosclerosis (ICD10-I70.0), coronary artery atherosclerosis and emphysema (ICD10-J43.9). Left renal atrophy. Infrarenal aortic ectasia. Electronically Signed   By: Abigail Miyamoto M.D.   On: 10/04/2019 12:57   US BREAST LTD UNI RIGHT INC AXILLA  Result Date: 09/12/2019 CLINICAL DATA:  Patient presents for evaluation of palpable abnormality within the medial right breast. EXAM: DIGITAL DIAGNOSTIC BILATERAL MAMMOGRAM WITH CAD AND TOMO ULTRASOUND RIGHT BREAST COMPARISON:  Previous exam(s). ACR Breast Density Category d: The breast tissue is extremely dense, which lowers the  sensitivity of mammography. FINDINGS: Within the lower inner right breast middle depth there is an irregular mass with associated architectural distortion underlying the palpable marker. Magnification views demonstrate a new 8 mm group of coarse heterogeneous calcifications within the outer right breast middle depth, demonstrated best on the cc view. No additional concerning findings within the right or left breast. Mammographic images were processed with CAD. Targeted ultrasound is performed, showing a 1.4 x 1.7 x 0.9 cm irregular hypoechoic mass right breast 3 o'clock position 5 cm from the nipple at the site of palpable concern. There are at least 2 cortically thickened right axillary lymph nodes, the largest measures 9 mm in cortical thickness. IMPRESSION: 1. Suspicious palpable right breast mass 3 o'clock position. 2. Suspicious coarse heterogeneous calcifications outer right breast. 3. Multiple cortically thickened right axillary lymph nodes. RECOMMENDATION: 1. Ultrasound-guided core needle biopsy palpable right breast mass 3 o'clock position. 2. Stereotactic guided core needle biopsy outer right breast calcifications. 3. Ultrasound-guided core needle biopsy of a cortically thickened right axillary lymph node. I have discussed the findings and recommendations with the patient. If applicable, a reminder letter will be sent to the patient regarding the next appointment. BI-RADS CATEGORY  5: Highly suggestive of malignancy. Electronically Signed   By: Lovey Newcomer M.D.   On: 09/12/2019 15:24   MM DIAG BREAST TOMO BILATERAL  Result Date: 09/12/2019 CLINICAL DATA:  Patient presents for evaluation of palpable abnormality within the medial right breast. EXAM: DIGITAL DIAGNOSTIC BILATERAL MAMMOGRAM WITH CAD AND TOMO ULTRASOUND RIGHT BREAST COMPARISON:  Previous exam(s). ACR Breast Density Category d: The breast tissue is extremely dense, which lowers the sensitivity of mammography. FINDINGS: Within the lower inner  right breast middle depth there is an irregular mass with associated architectural distortion underlying the palpable marker. Magnification views demonstrate a new 8 mm group of coarse heterogeneous calcifications within the outer right breast middle depth, demonstrated best on the cc view. No additional concerning findings within the right or left breast. Mammographic images were processed with CAD. Targeted ultrasound is performed, showing a 1.4 x  1.7 x 0.9 cm irregular hypoechoic mass right breast 3 o'clock position 5 cm from the nipple at the site of palpable concern. There are at least 2 cortically thickened right axillary lymph nodes, the largest measures 9 mm in cortical thickness. IMPRESSION: 1. Suspicious palpable right breast mass 3 o'clock position. 2. Suspicious coarse heterogeneous calcifications outer right breast. 3. Multiple cortically thickened right axillary lymph nodes. RECOMMENDATION: 1. Ultrasound-guided core needle biopsy palpable right breast mass 3 o'clock position. 2. Stereotactic guided core needle biopsy outer right breast calcifications. 3. Ultrasound-guided core needle biopsy of a cortically thickened right axillary lymph node. I have discussed the findings and recommendations with the patient. If applicable, a reminder letter will be sent to the patient regarding the next appointment. BI-RADS CATEGORY  5: Highly suggestive of malignancy. Electronically Signed   By: Lovey Newcomer M.D.   On: 09/12/2019 15:24   MM CLIP PLACEMENT RIGHT  Result Date: 09/19/2019 CLINICAL DATA:  Status post ultrasound guided core biopsy of mass in the 3 o'clock location, ultrasound biopsy of RIGHT axillary lymph node, and stereotactic guided core biopsy of calcifications the LOWER portion of the RIGHT. EXAM: DIAGNOSTIC RIGHT MAMMOGRAM POST ULTRASOUND BIOPSY X2 AND STEREOTACTIC BIOPSY X1 COMPARISON:  Previous exam(s). FINDINGS: Mammographic images were obtained following ultrasound guided biopsy of mass in  the 3 o'clock location of the RIGHT breast and placement of a heart shaped clip. The biopsy marking clip is in expected position at the site of biopsy. Following biopsy of enlarged RIGHT axillary lymph node, a spiral shaped HydroMARK clip was placed and is identified in the appropriate location. Following stereotactic guided core biopsy of calcifications the LOWER portion of the RIGHT breast, an X shaped clip was placed and is identified in the expected location. IMPRESSION: Tissue marker clips are in the expected locations after biopsy. Final Assessment: Post Procedure Mammograms for Marker Placement Electronically Signed   By: Nolon Nations M.D.   On: 09/19/2019 10:20   MM RT BREAST BX W LOC DEV 1ST LESION IMAGE BX SPEC STEREO GUIDE  Addendum Date: 09/21/2019   ADDENDUM REPORT: 09/21/2019 13:55 ADDENDUM: PATHOLOGY revealed: A. RIGHT BREAST, 3:00; ULTRASOUND-GUIDED BIOPSY: - INVASIVE MAMMARY CARCINOMA WITH MICROPAPILLARY FEATURES. 63m in this sample. Grade 2. Lymphovascular invasion: Present. B. LYMPH NODE, RIGHT AXILLARY; ULTRASOUND-GUIDED NEEDLE CORE BIOPSY: - METASTATIC MAMMARY CARCINOMA, 6 MM IN THIS SAMPLE. C. RIGHT BREAST, LATERAL CALCIFICATIONS; STEREOTACTIC BIOPSY: - DUCTAL CARCINOMA IN SITU, INTERMEDIATE GRADE, WITH CALCIFICATIONS. Pathology results are CONCORDANT with imaging findings, per Dr. ENolon Nations At provider's request, pathology results and recommendations below were discussed with patient by Dr. MDerinda Lateon 09/20/2019. Recommendation: Surgical and oncology referral. [place] RN from provider office stated the provider will call patient with results and coordinate patient's surgical and oncology referrals. Addendum by LElecta SniffRN on 09/21/2019. Electronically Signed   By: ENolon NationsM.D.   On: 09/21/2019 13:55   Result Date: 09/21/2019 CLINICAL DATA:  Patient presents for stereotactic guided core biopsy of RIGHT breast calcifications. EXAM: RIGHT BREAST STEREOTACTIC CORE  NEEDLE BIOPSY COMPARISON:  Previous exams. FINDINGS: The patient and I discussed the procedure of stereotactic-guided biopsy including benefits and alternatives. We discussed the high likelihood of a successful procedure. We discussed the risks of the procedure including infection, bleeding, tissue injury, clip migration, and inadequate sampling. Informed written consent was given. The usual time out protocol was performed immediately prior to the procedure. Using sterile technique and 1% lidocaine and 1% lidocaine with epinephrine as local anesthetic, under  stereotactic guidance, a 9 gauge vacuum assisted device was used to perform core needle biopsy of calcifications in the UPPER-OUTER QUADRANT of the RIGHT breast using a craniocaudal approach. Specimen radiograph was performed showing calcifications in numerous tissue samples. Specimens with calcifications are identified for pathology. Lesion quadrant: UPPER-OUTER QUADRANT RIGHT breast At the conclusion of the procedure, X shaped tissue marker clip was deployed into the biopsy cavity. Follow-up 2-view mammogram was performed and dictated separately. IMPRESSION: Stereotactic-guided biopsy of RIGHT breast calcifications. No apparent complications. Patient stepped taking Plavix and aspirin 7 days prior to procedure, and discussed with Dr. Lucky Cowboy. Anticoagulation will be restarted. Electronically Signed: By: Nolon Nations M.D. On: 09/19/2019 11:43   [place] RT BREAST BX W LOC DEV 1ST LESION IMG BX SPEC US GUIDE  Addendum Date: 09/21/2019   ADDENDUM REPORT: 09/21/2019 13:51 ADDENDUM: PATHOLOGY revealed: A. RIGHT BREAST, 3:00; ULTRASOUND-GUIDED BIOPSY: - INVASIVE MAMMARY CARCINOMA WITH MICROPAPILLARY FEATURES. 5m in this sample. Grade 2. Lymphovascular invasion: Present. B. LYMPH NODE, RIGHT AXILLARY; ULTRASOUND-GUIDED NEEDLE CORE BIOPSY: - METASTATIC MAMMARY CARCINOMA, 6 MM IN THIS SAMPLE. C. RIGHT BREAST, LATERAL CALCIFICATIONS; STEREOTACTIC BIOPSY: - DUCTAL  CARCINOMA IN SITU, INTERMEDIATE GRADE, WITH CALCIFICATIONS. Pathology results are CONCORDANT with imaging findings, per Dr. ENolon Nations At provider's request, pathology results and recommendations below were discussed with patient by Dr. MDerinda Lateon 09/20/2019. Recommendation: Surgical and oncology referral. [place] RN from provider office stated the provider will call patient with results and coordinate patient's surgical and oncology referrals. Addendum by LElecta SniffRN on 09/21/2019. Electronically Signed   By: ENolon NationsM.D.   On: 09/21/2019 13:51   Result Date: 09/21/2019 CLINICAL DATA:  Patient presents for ultrasound-guided core biopsy of mass in the 3 o'clock location of the RIGHT breast and enlarged lymph node in the RIGHT axilla. EXAM: ULTRASOUND GUIDED RIGHT BREAST CORE NEEDLE BIOPSY x2 COMPARISON:  Previous exam(s). PROCEDURE: I met with the patient and we discussed the procedure of ultrasound-guided biopsy, including benefits and alternatives. We discussed the high likelihood of a successful procedure. We discussed the risks of the procedure, including infection, bleeding, tissue injury, clip migration, and inadequate sampling. Informed written consent was given. The usual time-out protocol was performed immediately prior to the procedure. Biopsy site 1: 3 o'clock location of the RIGHT breast. Lesion QUADRANT: MEDIAL RIGHT breast mass, heart shaped clip Using sterile technique and 1% Lidocaine as local anesthetic, under direct ultrasound visualization, a 12 gauge spring-loaded device was used to perform biopsy of mass in the 3 o'clock location of the RIGHT breast using a inferior to superior approach. At the conclusion of the procedure heart shaped tissue marker clip was deployed into the biopsy cavity. Biopsy site 2: Enlarged RIGHT axillary lymph node. Lesion QUADRANT: RIGHT axilla, spiral shaped HydroMARK clip Using sterile technique and 1% Lidocaine as local anesthetic, under direct  ultrasound visualization, a 14 gauge spring-loaded device was used to perform biopsy of enlarged RIGHT axillary using a LATERAL to MEDIAL approach. At the conclusion of the procedure spiral shaped HydroMARK tissue marker clip was deployed into the biopsy cavity. Follow up 2 view mammogram was performed and dictated separately. IMPRESSION: Ultrasound guided biopsy of mass in the 3 o'clock location of the RIGHT breast and enlarged RIGHT axillary lymph node. No apparent complications. Patient stepped taking Plavix and aspirin 7 days prior to procedure, discussed with Dr. DLucky Cowboy Plavix and baby aspirin should be restarted at this time. Patient is aware. Electronically Signed: By: ENolon NationsM.D. On: 09/19/2019 11:46  [place] RT BREAST BX W LOC DEV EA ADD LESION IMG BX SPEC US GUIDE  Addendum Date: 09/21/2019   ADDENDUM REPORT: 09/21/2019 13:51 ADDENDUM: PATHOLOGY revealed: A. RIGHT BREAST, 3:00; ULTRASOUND-GUIDED BIOPSY: - INVASIVE MAMMARY CARCINOMA WITH MICROPAPILLARY FEATURES. 4m in this sample. Grade 2. Lymphovascular invasion: Present. B. LYMPH NODE, RIGHT AXILLARY; ULTRASOUND-GUIDED NEEDLE CORE BIOPSY: - METASTATIC MAMMARY CARCINOMA, 6 MM IN THIS SAMPLE. C. RIGHT BREAST, LATERAL CALCIFICATIONS; STEREOTACTIC BIOPSY: - DUCTAL CARCINOMA IN SITU, INTERMEDIATE GRADE, WITH CALCIFICATIONS. Pathology results are CONCORDANT with imaging findings, per Dr. ENolon Nations At provider's request, pathology results and recommendations below were discussed with patient by Dr. MDerinda Lateon 09/20/2019. Recommendation: Surgical and oncology referral. [place] RN from provider office stated the provider will call patient with results and coordinate patient's surgical and oncology referrals. Addendum by LElecta SniffRN on 09/21/2019. Electronically Signed   By: ENolon NationsM.D.   On: 09/21/2019 13:51   Result Date: 09/21/2019 CLINICAL DATA:  Patient presents for ultrasound-guided core biopsy of mass in the 3 o'clock  location of the RIGHT breast and enlarged lymph node in the RIGHT axilla. EXAM: ULTRASOUND GUIDED RIGHT BREAST CORE NEEDLE BIOPSY x2 COMPARISON:  Previous exam(s). PROCEDURE: I met with the patient and we discussed the procedure of ultrasound-guided biopsy, including benefits and alternatives. We discussed the high likelihood of a successful procedure. We discussed the risks of the procedure, including infection, bleeding, tissue injury, clip migration, and inadequate sampling. Informed written consent was given. The usual time-out protocol was performed immediately prior to the procedure. Biopsy site 1: 3 o'clock location of the RIGHT breast. Lesion QUADRANT: MEDIAL RIGHT breast mass, heart shaped clip Using sterile technique and 1% Lidocaine as local anesthetic, under direct ultrasound visualization, a 12 gauge spring-loaded device was used to perform biopsy of mass in the 3 o'clock location of the RIGHT breast using a inferior to superior approach. At the conclusion of the procedure heart shaped tissue marker clip was deployed into the biopsy cavity. Biopsy site 2: Enlarged RIGHT axillary lymph node. Lesion QUADRANT: RIGHT axilla, spiral shaped HydroMARK clip Using sterile technique and 1% Lidocaine as local anesthetic, under direct ultrasound visualization, a 14 gauge spring-loaded device was used to perform biopsy of enlarged RIGHT axillary using a LATERAL to MEDIAL approach. At the conclusion of the procedure spiral shaped HydroMARK tissue marker clip was deployed into the biopsy cavity. Follow up 2 view mammogram was performed and dictated separately. IMPRESSION: Ultrasound guided biopsy of mass in the 3 o'clock location of the RIGHT breast and enlarged RIGHT axillary lymph node. No apparent complications. Patient stepped taking Plavix and aspirin 7 days prior to procedure, discussed with Dr. DLucky Cowboy Plavix and baby aspirin should be restarted at this time. Patient is aware. Electronically Signed: By: ENolon NationsM.D. 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 #Right breast-DCIS-s/p biopsy.  #Left breast-approximately 1 cm-based on PET; no biopsy needed.  #May 2021-PET scan shows right breast malignancy; positive axillary lymph node; positive subpectoral lymph nodes.  Left breast-positive for malignancy; no lymphadenopathy.  No evidence of distant site disease.  #Discussed multiple options for mostly surgical; which at the end of the discussion leaning towards right mastectomy; lymph node dissection.  Left lumpectomy; sentinel lymph node biopsy.  Patient is  sent that she will need postmastectomy radiation given the positive lymph nodes.  This was discussed with Dr. Tollie Pizza.  #With regards to systemic therapy-patient not a candidate for adjuvant chemotherapy-given her age/overall frailty/chronic kidney disease.  Patient is a good candidate for adjuvant endocrine therapy.  # CKD- Stage-III- IV [Dr.Kolluru]  I spoke at length with the patient's daughter regarding the patient's clinical status/plan of care.  Daughter in agreement.  Also discussed with breast navigator.  # DISPOSITION: # follow up TBD- Dr.B  Cc;Dr.Baboff  # I reviewed the blood work- with the patient in detail; also reviewed the imaging independently [as summarized above]; and with the patient in detail.    All questions were answered. The patient/family knows to call the clinic with any problems, questions or concerns.     Cammie Sickle, MD 10/09/2019 6:23 PM

## 2019-10-06 ENCOUNTER — Other Ambulatory Visit: Payer: Self-pay | Admitting: General Surgery

## 2019-10-06 DIAGNOSIS — C50512 Malignant neoplasm of lower-outer quadrant of left female breast: Secondary | ICD-10-CM

## 2019-10-10 ENCOUNTER — Other Ambulatory Visit: Payer: Self-pay | Admitting: General Surgery

## 2019-10-10 ENCOUNTER — Inpatient Hospital Stay: Payer: Medicare PPO | Admitting: Internal Medicine

## 2019-10-11 ENCOUNTER — Other Ambulatory Visit
Admission: RE | Admit: 2019-10-11 | Discharge: 2019-10-11 | Disposition: A | Discharge status: Home / Self Care | Payer: Medicare PPO | Source: Ambulatory Visit | Attending: General Surgery | Admitting: General Surgery

## 2019-10-11 ENCOUNTER — Other Ambulatory Visit: Payer: Self-pay

## 2019-10-11 DIAGNOSIS — Z01812 Encounter for preprocedural laboratory examination: Secondary | ICD-10-CM | POA: Insufficient documentation

## 2019-10-11 DIAGNOSIS — Z20822 Contact with and (suspected) exposure to covid-19: Secondary | ICD-10-CM | POA: Diagnosis not present

## 2019-10-11 LAB — SARS CORONAVIRUS 2 (TAT 6-24 HRS): SARS Coronavirus 2: NEGATIVE

## 2019-10-13 ENCOUNTER — Ambulatory Visit: Payer: Medicare PPO | Admitting: Certified Registered Nurse Anesthetist

## 2019-10-13 ENCOUNTER — Other Ambulatory Visit: Payer: Self-pay

## 2019-10-13 ENCOUNTER — Encounter
Admission: RE | Disposition: A | Discharge status: Home / Self Care | Payer: Self-pay | Source: Home / Self Care | Attending: General Surgery

## 2019-10-13 ENCOUNTER — Ambulatory Visit (HOSPITAL_BASED_OUTPATIENT_CLINIC_OR_DEPARTMENT_OTHER)
Admission: RE | Admit: 2019-10-13 | Discharge: 2019-10-13 | Disposition: A | Discharge status: Home / Self Care | Payer: Medicare PPO | Source: Ambulatory Visit | Attending: General Surgery | Admitting: General Surgery

## 2019-10-13 ENCOUNTER — Observation Stay
Admission: RE | Admit: 2019-10-13 | Discharge: 2019-10-14 | Disposition: A | Discharge status: Home / Self Care | Payer: Medicare PPO | Attending: General Surgery | Admitting: General Surgery

## 2019-10-13 ENCOUNTER — Encounter: Payer: Self-pay | Admitting: General Surgery

## 2019-10-13 DIAGNOSIS — Z7982 Long term (current) use of aspirin: Secondary | ICD-10-CM | POA: Diagnosis not present

## 2019-10-13 DIAGNOSIS — Z888 Allergy status to other drugs, medicaments and biological substances status: Secondary | ICD-10-CM | POA: Diagnosis not present

## 2019-10-13 DIAGNOSIS — E7800 Pure hypercholesterolemia, unspecified: Secondary | ICD-10-CM | POA: Insufficient documentation

## 2019-10-13 DIAGNOSIS — C773 Secondary and unspecified malignant neoplasm of axilla and upper limb lymph nodes: Secondary | ICD-10-CM | POA: Diagnosis not present

## 2019-10-13 DIAGNOSIS — Z85828 Personal history of other malignant neoplasm of skin: Secondary | ICD-10-CM | POA: Insufficient documentation

## 2019-10-13 DIAGNOSIS — Z7902 Long term (current) use of antithrombotics/antiplatelets: Secondary | ICD-10-CM | POA: Diagnosis not present

## 2019-10-13 DIAGNOSIS — C50311 Malignant neoplasm of lower-inner quadrant of right female breast: Secondary | ICD-10-CM

## 2019-10-13 DIAGNOSIS — I129 Hypertensive chronic kidney disease with stage 1 through stage 4 chronic kidney disease, or unspecified chronic kidney disease: Secondary | ICD-10-CM | POA: Insufficient documentation

## 2019-10-13 DIAGNOSIS — C50312 Malignant neoplasm of lower-inner quadrant of left female breast: Secondary | ICD-10-CM | POA: Insufficient documentation

## 2019-10-13 DIAGNOSIS — C50911 Malignant neoplasm of unspecified site of right female breast: Secondary | ICD-10-CM | POA: Diagnosis not present

## 2019-10-13 DIAGNOSIS — Z79899 Other long term (current) drug therapy: Secondary | ICD-10-CM | POA: Insufficient documentation

## 2019-10-13 DIAGNOSIS — N189 Chronic kidney disease, unspecified: Secondary | ICD-10-CM | POA: Diagnosis not present

## 2019-10-13 DIAGNOSIS — G629 Polyneuropathy, unspecified: Secondary | ICD-10-CM | POA: Insufficient documentation

## 2019-10-13 DIAGNOSIS — M109 Gout, unspecified: Secondary | ICD-10-CM | POA: Insufficient documentation

## 2019-10-13 DIAGNOSIS — C50919 Malignant neoplasm of unspecified site of unspecified female breast: Secondary | ICD-10-CM | POA: Diagnosis present

## 2019-10-13 DIAGNOSIS — M1990 Unspecified osteoarthritis, unspecified site: Secondary | ICD-10-CM | POA: Insufficient documentation

## 2019-10-13 DIAGNOSIS — C50512 Malignant neoplasm of lower-outer quadrant of left female breast: Secondary | ICD-10-CM | POA: Diagnosis not present

## 2019-10-13 DIAGNOSIS — Z880 Allergy status to penicillin: Secondary | ICD-10-CM | POA: Insufficient documentation

## 2019-10-13 HISTORY — PX: MASTECTOMY MODIFIED RADICAL: SHX5962

## 2019-10-13 HISTORY — PX: SIMPLE MASTECTOMY WITH AXILLARY SENTINEL NODE BIOPSY: SHX6098

## 2019-10-13 SURGERY — MASTECTOMY, MODIFIED RADICAL
Anesthesia: General | Site: Breast | Laterality: Right

## 2019-10-13 MED ORDER — ONDANSETRON HCL 4 MG/2ML IJ SOLN
4.0000 mg | Freq: Four times a day (QID) | INTRAMUSCULAR | Status: DC
  Administered 2019-10-13 – 2019-10-14 (×3): 4 mg via INTRAVENOUS
  Filled 2019-10-13 (×3): qty 2

## 2019-10-13 MED ORDER — TECHNETIUM TC 99M SULFUR COLLOID FILTERED
1.0000 | Freq: Once | INTRAVENOUS | Status: AC | PRN
  Administered 2019-10-13: 0.81 via INTRADERMAL

## 2019-10-13 MED ORDER — FAMOTIDINE 20 MG PO TABS
ORAL_TABLET | ORAL | Status: AC
  Administered 2019-10-13: 20 mg via ORAL
  Filled 2019-10-13: qty 1

## 2019-10-13 MED ORDER — FENTANYL CITRATE (PF) 100 MCG/2ML IJ SOLN
INTRAMUSCULAR | Status: DC | PRN
  Administered 2019-10-13: 12.5 ug via INTRAVENOUS
  Administered 2019-10-13 (×4): 25 ug via INTRAVENOUS
  Administered 2019-10-13: 12.5 ug via INTRAVENOUS
  Administered 2019-10-13: 25 ug via INTRAVENOUS
  Administered 2019-10-13: 50 ug via INTRAVENOUS
  Administered 2019-10-13 (×4): 25 ug via INTRAVENOUS

## 2019-10-13 MED ORDER — METHYLENE BLUE 0.5 % INJ SOLN
INTRAVENOUS | Status: AC
  Filled 2019-10-13: qty 10

## 2019-10-13 MED ORDER — DOCUSATE SODIUM 100 MG PO CAPS: 100.0000 mg | ORAL_CAPSULE | Freq: Every day | ORAL | Status: DC | PRN

## 2019-10-13 MED ORDER — VANCOMYCIN HCL IN DEXTROSE 1-5 GM/200ML-% IV SOLN
INTRAVENOUS | Status: AC
  Administered 2019-10-13: 1000 mg via INTRAVENOUS
  Filled 2019-10-13: qty 200

## 2019-10-13 MED ORDER — MORPHINE SULFATE (PF) 2 MG/ML IV SOLN: 1.0000 mg | INTRAVENOUS | Status: DC | PRN

## 2019-10-13 MED ORDER — PROPOFOL 10 MG/ML IV BOLUS
INTRAVENOUS | Status: DC | PRN
  Administered 2019-10-13: 100 mg via INTRAVENOUS
  Administered 2019-10-13 (×2): 30 mg via INTRAVENOUS
  Administered 2019-10-13: 20 mg via INTRAVENOUS

## 2019-10-13 MED ORDER — DEXMEDETOMIDINE HCL 200 MCG/2ML IV SOLN
INTRAVENOUS | Status: DC | PRN
  Administered 2019-10-13: 4 ug via INTRAVENOUS
  Administered 2019-10-13 (×2): 2 ug via INTRAVENOUS

## 2019-10-13 MED ORDER — LIDOCAINE HCL (CARDIAC) PF 100 MG/5ML IV SOSY
PREFILLED_SYRINGE | INTRAVENOUS | Status: DC | PRN
  Administered 2019-10-13: 60 mg via INTRAVENOUS

## 2019-10-13 MED ORDER — GLYCOPYRROLATE 0.2 MG/ML IJ SOLN
INTRAMUSCULAR | Status: DC | PRN
  Administered 2019-10-13: .1 mg via INTRAVENOUS

## 2019-10-13 MED ORDER — ONDANSETRON HCL 4 MG/2ML IJ SOLN
INTRAMUSCULAR | Status: DC | PRN
  Administered 2019-10-13: 4 mg via INTRAVENOUS

## 2019-10-13 MED ORDER — FENTANYL CITRATE (PF) 100 MCG/2ML IJ SOLN
INTRAMUSCULAR | Status: AC
  Filled 2019-10-13: qty 2

## 2019-10-13 MED ORDER — FAMOTIDINE 20 MG PO TABS: 20.0000 mg | ORAL_TABLET | Freq: Once | ORAL | Status: AC

## 2019-10-13 MED ORDER — CHLORHEXIDINE GLUCONATE 0.12 % MT SOLN: 15.0000 mL | Freq: Once | OROMUCOSAL | Status: AC

## 2019-10-13 MED ORDER — GABAPENTIN 100 MG PO CAPS
100.0000 mg | ORAL_CAPSULE | Freq: Every day | ORAL | Status: DC
  Administered 2019-10-13: 100 mg via ORAL
  Filled 2019-10-13: qty 1

## 2019-10-13 MED ORDER — PHENYLEPHRINE HCL (PRESSORS) 10 MG/ML IV SOLN
INTRAVENOUS | Status: DC | PRN
  Administered 2019-10-13: 50 ug via INTRAVENOUS
  Administered 2019-10-13: 100 ug via INTRAVENOUS

## 2019-10-13 MED ORDER — KETOROLAC TROMETHAMINE 15 MG/ML IJ SOLN
INTRAMUSCULAR | Status: DC | PRN
Start: 2019-10-13 — End: 2019-10-13
  Administered 2019-10-13: 7.5 mg via INTRAVENOUS

## 2019-10-13 MED ORDER — ACETAMINOPHEN 325 MG PO TABS: 650.0000 mg | ORAL_TABLET | ORAL | Status: DC | PRN

## 2019-10-13 MED ORDER — ORAL CARE MOUTH RINSE: 15.0000 mL | Freq: Once | OROMUCOSAL | Status: AC

## 2019-10-13 MED ORDER — ASPIRIN EC 81 MG PO TBEC
81.0000 mg | DELAYED_RELEASE_TABLET | Freq: Every day | ORAL | Status: DC
  Administered 2019-10-14: 81 mg via ORAL
  Filled 2019-10-13: qty 1

## 2019-10-13 MED ORDER — CITALOPRAM HYDROBROMIDE 20 MG PO TABS
10.0000 mg | ORAL_TABLET | Freq: Every day | ORAL | Status: DC
  Administered 2019-10-14: 10 mg via ORAL
  Filled 2019-10-13: qty 1

## 2019-10-13 MED ORDER — CHLORHEXIDINE GLUCONATE 0.12 % MT SOLN
OROMUCOSAL | Status: AC
  Administered 2019-10-13: 15 mL via OROMUCOSAL
  Filled 2019-10-13: qty 15

## 2019-10-13 MED ORDER — DEXAMETHASONE SODIUM PHOSPHATE 10 MG/ML IJ SOLN
INTRAMUSCULAR | Status: DC | PRN
  Administered 2019-10-13: 5 mg via INTRAVENOUS

## 2019-10-13 MED ORDER — LACTATED RINGERS IV SOLN: INTRAVENOUS | Status: DC

## 2019-10-13 MED ORDER — DULOXETINE HCL 30 MG PO CPEP
60.0000 mg | ORAL_CAPSULE | Freq: Every day | ORAL | Status: DC
  Administered 2019-10-14: 60 mg via ORAL
  Filled 2019-10-13: qty 2

## 2019-10-13 MED ORDER — OXYCODONE HCL 5 MG PO TABS: 2.5000 mg | ORAL_TABLET | ORAL | Status: DC | PRN

## 2019-10-13 MED ORDER — ACETAMINOPHEN 10 MG/ML IV SOLN
INTRAVENOUS | Status: AC
  Filled 2019-10-13: qty 100

## 2019-10-13 MED ORDER — BUPIVACAINE-EPINEPHRINE (PF) 0.5% -1:200000 IJ SOLN
INTRAMUSCULAR | Status: AC
  Filled 2019-10-13: qty 90

## 2019-10-13 MED ORDER — VANCOMYCIN HCL IN DEXTROSE 1-5 GM/200ML-% IV SOLN: 1000.0000 mg | INTRAVENOUS | Status: AC

## 2019-10-13 MED ORDER — PROPOFOL 10 MG/ML IV BOLUS
INTRAVENOUS | Status: AC
  Filled 2019-10-13: qty 20

## 2019-10-13 MED ORDER — GLYCOPYRROLATE 0.2 MG/ML IJ SOLN
INTRAMUSCULAR | Status: AC
  Filled 2019-10-13: qty 1

## 2019-10-13 MED ORDER — METOPROLOL SUCCINATE ER 25 MG PO TB24
37.5000 mg | ORAL_TABLET | Freq: Every day | ORAL | Status: DC
  Filled 2019-10-13: qty 2

## 2019-10-13 MED ORDER — METHYLENE BLUE 0.5 % INJ SOLN
INTRAVENOUS | Status: DC | PRN
  Administered 2019-10-13: 5 mL via SUBMUCOSAL

## 2019-10-13 MED ORDER — AMLODIPINE BESYLATE 5 MG PO TABS
2.5000 mg | ORAL_TABLET | Freq: Every day | ORAL | Status: DC
  Administered 2019-10-14: 2.5 mg via ORAL
  Filled 2019-10-13: qty 1

## 2019-10-13 MED ORDER — ACETAMINOPHEN 10 MG/ML IV SOLN
INTRAVENOUS | Status: DC | PRN
  Administered 2019-10-13: 1000 mg via INTRAVENOUS

## 2019-10-13 MED ORDER — ALLOPURINOL 100 MG PO TABS
300.0000 mg | ORAL_TABLET | Freq: Every day | ORAL | Status: DC
  Administered 2019-10-14: 300 mg via ORAL
  Filled 2019-10-13: qty 3

## 2019-10-13 SURGICAL SUPPLY — 64 items
APPLIER CLIP 11 MED OPEN (CLIP) ×3
APPLIER CLIP 13 LRG OPEN (CLIP)
BINDER BREAST LRG (GAUZE/BANDAGES/DRESSINGS) ×3 IMPLANT
BINDER BREAST MEDIUM (GAUZE/BANDAGES/DRESSINGS) ×3 IMPLANT
BLADE BOVIE TIP EXT 4 (BLADE) IMPLANT
BLADE PHOTON ILLUMINATED (MISCELLANEOUS) ×3 IMPLANT
BLADE SURG 15 STRL SS SAFETY (BLADE) ×6 IMPLANT
BULB RESERV EVAC DRAIN JP 100C (MISCELLANEOUS) ×9 IMPLANT
CANISTER SUCT 1200ML W/VALVE (MISCELLANEOUS) ×3 IMPLANT
CHLORAPREP W/TINT 26 (MISCELLANEOUS) ×3 IMPLANT
CLIP APPLIE 11 MED OPEN (CLIP) ×2 IMPLANT
CLIP APPLIE 13 LRG OPEN (CLIP) IMPLANT
CNTNR SPEC 2.5X3XGRAD LEK (MISCELLANEOUS) ×6
CONT SPEC 4OZ STER OR WHT (MISCELLANEOUS) ×3
CONTAINER SPEC 2.5X3XGRAD LEK (MISCELLANEOUS) ×6 IMPLANT
COVER PROBE FLX POLY STRL (MISCELLANEOUS) ×3 IMPLANT
COVER WAND RF STERILE (DRAPES) ×3 IMPLANT
DEVICE DUBIN SPECIMEN MAMMOGRA (MISCELLANEOUS) ×3 IMPLANT
DRAIN CHANNEL JP 15F RND 16 (MISCELLANEOUS) ×9 IMPLANT
DRAPE LAPAROTOMY TRNSV 106X77 (MISCELLANEOUS) ×3 IMPLANT
DRSG GAUZE FLUFF 36X18 (GAUZE/BANDAGES/DRESSINGS) ×12 IMPLANT
DRSG TELFA 3X8 NADH (GAUZE/BANDAGES/DRESSINGS) ×3 IMPLANT
ELECT CAUTERY BLADE TIP 2.5 (TIP) ×3
ELECT REM PT RETURN 9FT ADLT (ELECTROSURGICAL) ×3
ELECTRODE CAUTERY BLDE TIP 2.5 (TIP) ×2 IMPLANT
ELECTRODE REM PT RTRN 9FT ADLT (ELECTROSURGICAL) ×2 IMPLANT
GAUZE SPONGE 4X4 12PLY STRL (GAUZE/BANDAGES/DRESSINGS) ×3 IMPLANT
GLOVE BIO SURGEON STRL SZ7.5 (GLOVE) ×3 IMPLANT
GLOVE INDICATOR 8.0 STRL GRN (GLOVE) ×3 IMPLANT
GOWN STRL REUS W/ TWL LRG LVL3 (GOWN DISPOSABLE) ×4 IMPLANT
GOWN STRL REUS W/TWL LRG LVL3 (GOWN DISPOSABLE) ×2
KIT TURNOVER KIT A (KITS) ×3 IMPLANT
LABEL OR SOLS (LABEL) ×3 IMPLANT
MARGIN MAP 10MM (MISCELLANEOUS) ×3 IMPLANT
NEEDLE BIOPSY 14X6 SOFT TISS (NEEDLE) ×3 IMPLANT
NEEDLE HYPO 22GX1.5 SAFETY (NEEDLE) ×3 IMPLANT
NEEDLE HYPO 25X1 1.5 SAFETY (NEEDLE) ×6 IMPLANT
PACK BASIN MINOR (MISCELLANEOUS) ×3 IMPLANT
PIN SAFETY STRL (MISCELLANEOUS) ×3 IMPLANT
RETRACTOR RING XSMALL (MISCELLANEOUS) IMPLANT
RTRCTR WOUND ALEXIS 13CM XS SH (MISCELLANEOUS)
SHEARS FOC LG CVD HARMONIC 17C (MISCELLANEOUS) ×3 IMPLANT
SHEARS HARMONIC 9CM CVD (BLADE) IMPLANT
SLEVE PROBE SENORX GAMMA FIND (MISCELLANEOUS) ×3 IMPLANT
SPONGE LAP 18X18 RF (DISPOSABLE) ×9 IMPLANT
STRIP CLOSURE SKIN 1/2X4 (GAUZE/BANDAGES/DRESSINGS) ×6 IMPLANT
SUT ETHILON 3-0 FS-10 30 BLK (SUTURE) ×3
SUT SILK 2 0 (SUTURE) ×2
SUT SILK 2-0 18XBRD TIE 12 (SUTURE) ×2 IMPLANT
SUT SILK 2-0 30XBRD TIE 12 (SUTURE) ×2 IMPLANT
SUT VIC AB 2-0 CT1 27 (SUTURE) ×5
SUT VIC AB 2-0 CT1 TAPERPNT 27 (SUTURE) ×10 IMPLANT
SUT VIC AB 3-0 SH 27 (SUTURE) ×2
SUT VIC AB 3-0 SH 27X BRD (SUTURE) ×4 IMPLANT
SUT VIC AB 4-0 FS2 27 (SUTURE) ×6 IMPLANT
SUT VICRYL+ 3-0 144IN (SUTURE) ×3 IMPLANT
SUTURE EHLN 3-0 FS-10 30 BLK (SUTURE) ×2 IMPLANT
SWABSTK COMLB BENZOIN TINCTURE (MISCELLANEOUS) ×6 IMPLANT
SYR 10ML LL (SYRINGE) ×3 IMPLANT
SYR BULB IRRIG 60ML STRL (SYRINGE) ×3 IMPLANT
SYS BIOPSY MAX CORE 14GX10 (NEEDLE) ×3 IMPLANT
TAPE TRANSPORE STRL 2 31045 (GAUZE/BANDAGES/DRESSINGS) ×3 IMPLANT
TOWEL OR 17X26 4PK STRL BLUE (TOWEL DISPOSABLE) ×6 IMPLANT
WATER STERILE IRR 1000ML POUR (IV SOLUTION) ×9 IMPLANT

## 2019-10-13 NOTE — Anesthesia Postprocedure Evaluation (Signed)
Anesthesia Post Note  Patient: Tina Curtis  Procedure(s) Performed: MASTECTOMY MODIFIED RADICAL (Right Breast) SIMPLE MASTECTOMY TRUE CUT BIOPSY, SENTINEL NODE BIOPSY (Left Breast)  Patient location during evaluation: PACU Anesthesia Type: General Level of consciousness: awake and alert Pain management: pain level controlled Vital Signs Assessment: post-procedure vital signs reviewed and stable Respiratory status: spontaneous breathing, nonlabored ventilation, respiratory function stable and patient connected to nasal cannula oxygen Cardiovascular status: blood pressure returned to baseline and stable Postop Assessment: no apparent nausea or vomiting Anesthetic complications: no     Last Vitals:  Vitals:   10/13/19 1800 10/13/19 1833  BP: (!) 160/71 (!) 153/79  Pulse: 64 (!) 57  Resp: 16 18  Temp: 36.5 C 36.6 C  SpO2: 100% 100%    Last Pain:  Vitals:   10/13/19 1833  TempSrc: Oral  PainSc:                  Martha Clan

## 2019-10-13 NOTE — H&P (Signed)
Tina Curtis [id number] [birth year]     HPI:  86 year old woman with recently noted right breast mass and axillary adenopathy. Clinical exam showed a left breast mass as well. Ultrasound suspicious for malignancy on the left. PET positive bilaterally with no evidence of distant disease.   Will biopsy the left side, proceed with right MRM and follow with left SM if biopsy positive.     Medications Prior to Admission  Medication Sig Dispense Refill Last Dose  . allopurinol (ZYLOPRIM) 300 MG tablet Take 300 mg by mouth daily.    10/13/2019 at Unknown time  . amLODipine (NORVASC) 5 MG tablet Take 2.5 mg by mouth daily.   1 10/13/2019 at Unknown time  . aspirin EC 81 MG tablet Take 81 mg by mouth daily.   10/13/2019 at Unknown time  . Baclofen 5 MG TABS Take 2.5 mg by mouth in the morning and at bedtime.    10/13/2019 at Unknown time  . Cholecalciferol (VITAMIN D PO) Take 1 tablet by mouth daily as needed (winter months).    10/12/2019 at Unknown time  . citalopram (CELEXA) 10 MG tablet Take 10 mg by mouth daily.    10/13/2019 at Unknown time  . clopidogrel (PLAVIX) 75 MG tablet TAKE 1 TABLET BY MOUTH EVERY DAY 30 tablet 1 10/09/2019 at Unknown time  . docusate sodium (COLACE) 100 MG capsule Take 100 mg by mouth daily as needed for mild constipation.   Past Month at Unknown time  . DULoxetine (CYMBALTA) 60 MG capsule Take 60 mg by mouth daily.    10/13/2019 at Unknown time  . furosemide (LASIX) 20 MG tablet Take 20 mg by mouth daily.    10/13/2019 at Unknown time  . gabapentin (NEURONTIN) 100 MG capsule Take 100 mg by mouth at bedtime.    10/12/2019 at Unknown time  . hydrALAZINE (APRESOLINE) 50 MG tablet Take 1 tablet (50 mg total) by mouth 2 (two) times daily. 30 tablet 0 10/13/2019 at Unknown time  . lidocaine-prilocaine (EMLA) cream Apply 1 application topically as needed (to breast).     Marland Kitchen loratadine (CLARITIN) 10 MG tablet Take 10 mg by mouth daily as needed for allergies. AM   Past Month at Unknown time  .  metoprolol succinate (TOPROL-XL) 25 MG 24 hr tablet Take 37.5 mg by mouth at bedtime.    10/12/2019 at Unknown time  . Multiple Vitamins-Minerals (ICAPS PO) Take 1 capsule by mouth in the morning and at bedtime.    Past Week at Unknown time  . polyethylene glycol (MIRALAX / GLYCOLAX) packet Take 17 g by mouth daily as needed. 60 each 0 Past Week at Unknown time  . simvastatin (ZOCOR) 20 MG tablet Take 20 mg by mouth every evening. PM    10/12/2019 at Unknown time   Allergies  Allergen Reactions  . Penicillins Anaphylaxis  . Drug Ingredient [Zinc]    Past Medical History:  Diagnosis Date  . Arthritis    Gout  . Chronic kidney disease   . Diarrhea   . Hypercholesteremia   . Hypertension   . Neuropathy    feet and lower legs  . Seasonal allergies   . Skin cancer    face   Past Surgical History:  Procedure Laterality Date  . ABDOMINAL HYSTERECTOMY  2000  . APPENDECTOMY    . BREAST BIOPSY Right 09/19/2019   affirm bx of calcs UOQ, x marker, path pending  . BREAST BIOPSY Right 09/19/2019   [place] bx of  mass,heart marker, path pending  . BREAST BIOPSY Right 09/19/2019   [place] bx of LN, coil marker, path pending  . CATARACT EXTRACTION Left   . CATARACT EXTRACTION W/PHACO Right 10/24/2014   Procedure: CATARACT EXTRACTION PHACO AND INTRAOCULAR LENS PLACEMENT (IOC);  Surgeon: Leandrew Koyanagi, MD;  Location: [facility];  Service: Ophthalmology;  Laterality: Right;  . ESOPHAGOGASTRODUODENOSCOPY N/A 03/07/2018   Procedure: ESOPHAGOGASTRODUODENOSCOPY (EGD);  Surgeon: Lin Landsman, MD;  Location: [facility] ENDOSCOPY;  Service: Gastroenterology;  Laterality: N/A;  . RENAL ANGIOGRAPHY Right 04/11/2018   Procedure: RENAL ANGIOGRAPHY;  Surgeon: Algernon Huxley, MD;  Location: [place] CV LAB;  Service: Cardiovascular;  Laterality: Right;   Social History   Socioeconomic History  . Marital status: Widowed    Spouse name: Not on file  . Number of children: Not on file  . Years of  education: Not on file  . Highest education level: Not on file  Occupational History  . Not on file  Tobacco Use  . Smoking status: Never Smoker  . Smokeless tobacco: Never Used  Substance and Sexual Activity  . Alcohol use: No  . Drug use: Never  . Sexual activity: Not on file  Other Topics Concern  . 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    Social Determinants of Health   Financial Resource Strain:   . Difficulty of Paying Living Expenses:   Food Insecurity:   . Worried About Charity fundraiser in the Last Year:   . Arboriculturist in the Last Year:   Transportation Needs:   . Film/video editor (Medical):   Marland Kitchen Lack of Transportation (Non-Medical):   Physical Activity:   . Days of Exercise per Week:   . Minutes of Exercise per Session:   Stress:   . Feeling of Stress :   Social Connections:   . Frequency of Communication with Friends and Family:   . Frequency of Social Gatherings with Friends and Family:   . Attends Religious Services:   . Active Member of Clubs or Organizations:   . Attends Archivist Meetings:   Marland Kitchen Marital Status:   Intimate Partner Violence:   . Fear of Current or Ex-Partner:   . Emotionally Abused:   Marland Kitchen Physically Abused:   . 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      ROS: Negative.     PE: HEENT: Negative. Lungs: Clear. Cardio: RR.   Assessment/Plan:  Proceed with planned right modified radical mastectomy, left biopsy, likely simple mastectomy and SLN biopsy.     Forest Gleason [facility] 10/13/2019

## 2019-10-13 NOTE — Anesthesia Preprocedure Evaluation (Addendum)
Anesthesia Evaluation  Patient identified by MRN, date of birth, ID band Patient awake    Reviewed: Allergy & Precautions, H&P , NPO status , Patient's Chart, lab work & pertinent test results  Airway Mallampati: II  TM Distance: >3 FB     Dental  (+) Teeth Intact   Pulmonary neg pulmonary ROS,    Pulmonary exam normal breath sounds clear to auscultation       Cardiovascular hypertension, Normal cardiovascular exam Rhythm:regular Rate:Normal     Neuro/Psych negative neurological ROS  negative psych ROS   GI/Hepatic negative GI ROS, Neg liver ROS,   Endo/Other  negative endocrine ROS  Renal/GU CRFRenal disease     Musculoskeletal   Abdominal   Peds  Hematology negative hematology ROS (+)   Anesthesia Other Findings Past Medical History: No date: Arthritis     Comment:  Gout No date: Chronic kidney disease No date: Diarrhea No date: Hypercholesteremia No date: Hypertension No date: Neuropathy     Comment:  feet and lower legs No date: Seasonal allergies No date: Skin cancer     Comment:  face  Past Surgical History: 2000: ABDOMINAL HYSTERECTOMY No date: APPENDECTOMY 09/19/2019: BREAST BIOPSY; Right     Comment:  affirm bx of calcs UOQ, x marker, path pending 09/19/2019: BREAST BIOPSY; Right     Comment:  [place] bx of mass,heart marker, path pending 09/19/2019: BREAST BIOPSY; Right     Comment:  [place] bx of LN, coil marker, path pending No date: CATARACT EXTRACTION; Left 10/24/2014: CATARACT EXTRACTION W/PHACO; Right     Comment:  Procedure: CATARACT EXTRACTION PHACO AND INTRAOCULAR               LENS PLACEMENT (IOC);  Surgeon: Leandrew Koyanagi, MD;               Location: [facility];  Service: Ophthalmology;                Laterality: Right; 03/07/2018: ESOPHAGOGASTRODUODENOSCOPY; N/A     Comment:  Procedure: ESOPHAGOGASTRODUODENOSCOPY (EGD);  Surgeon:               Lin Landsman, MD;   Location: [facility] ENDOSCOPY;                Service: Gastroenterology;  Laterality: N/A; 04/11/2018: RENAL ANGIOGRAPHY; Right     Comment:  Procedure: RENAL ANGIOGRAPHY;  Surgeon: Algernon Huxley,               MD;  Location: [place] CV LAB;  Service:               Cardiovascular;  Laterality: Right;  BMI    Body Mass Index: 24.03 kg/m      Reproductive/Obstetrics negative OB ROS                           Anesthesia Physical Anesthesia Plan  ASA: II  Anesthesia Plan: General LMA   Post-op Pain Management:    Induction:   PONV Risk Score and Plan: Dexamethasone, Ondansetron, Midazolam and Treatment may vary due to age or medical condition  Airway Management Planned:   Additional Equipment:   Intra-op Plan:   Post-operative Plan:   Informed Consent: I have reviewed the patients History and Physical, chart, labs and discussed the procedure including the risks, benefits and alternatives for the proposed anesthesia with the patient or authorized representative who has indicated his/her understanding and acceptance.  Dental Advisory Given  Plan Discussed with: Anesthesiologist, CRNA and Surgeon  Anesthesia Plan Comments:         Anesthesia Quick Evaluation

## 2019-10-13 NOTE — Op Note (Signed)
Preoperative diagnosis: Bilateral breast cancer.  Postoperative diagnosis: Same.  Operative procedure: Left breast Tru-Cut biopsy with ultrasound guidance, right modified radical mastectomy, left simple mastectomy with sentinel node biopsy.  Operating surgeon: Hervey Ard, MD.  Assistant: Elsie Stain, RNFA.  Estimated blood loss: 100 cc.  Clinical note: This 86 year old woman noticed a mass in the right breast.  Subsequent evaluation by radiology included biopsy of both the right breast mass and a suspicious axillary node.  Both were positive.  Subsequent clinical exam showed a mass in the left breast not visible on her mammograms due to the unbelievably dense nature of her breast at this age.  Ultrasound was highly suspicious for malignancy.  She had a PET CT completed due to elevated CA 27-29 and CEA.  Except for extensive axillary disease on the right no evidence of distant metastatic foci appreciated.  She was felt to be a candidate for breast resection to decrease her tumor burden.  She was felt to have areas of level 3 nodes on the right that could be managed with postoperative radiation.  Plans were for a left core biopsy to confirm the clinical and radiologic suspicion of malignancy prior to mastectomy.  Operative note: The left breast was cleansed with ChloraPrep.  An 11 blade was used to make a skin incision and ultrasound used to guide a 14-gauge Bard biopsy device x3 through the suspicious lesion at the 6-7 o'clock position.  This was sent fresh to pathology and report was adenocarcinoma.  While the breast specimen was being processed the breast chest and axilla were cleansed bilaterally with ChloraPrep.  An elliptical incision was outlined on the right chest.  The skin was then divided sharply and the remaining dissection completed with the photon blade.  The margins of dissection were the clavicle superiorly, sternum medially, rectus fascia inferiorly and serratus muscle  laterally.  The breast was elevated off the underlying muscle taking the fascia with the specimen.  Intercostal perforator controlled with 3-0 Vicryl tie.  The axillary envelope was opened and a formal axillary dissection of level 1 and 2 nodes undertaken.  There were palpable nodes about 6 mm in diameter behind the pectoralis major.  These were in level 3.  A double Hemoclip was placed to mark the lower margin of these nodes for localization of postoperative radiation was administered.  The axillary vein was identified and the axillary contents swept inferiorly.  The long thoracic nerve of Bell and thoracodorsal nerve artery and vein bundle were identified.  The intercostal brachial nerve was sacrificed.  Excellent hemostasis was noted.  The wound was irrigated with sterile water.  A 15-French Blake drain was brought out through the inferior medial flap and anchored into place with a 3-0 nylon suture.  The wound was closed with a running 2-0 Vicryl deep dermal suture in 2 segments.  Attention was turned to the left breast.  With receipt of pathology confirming the clinical impression of malignancy we proceeded with plans for a left simple mastectomy and sentinel node biopsy.  The patient had undergone injection with technetium sulfur colloid prior to the procedure and was injected with 5 cc of 0.5% methylene blue prior to the formal procedure being initiated.  An elliptical incision was again used in the same margins as noted above for the right breast.  The axillary envelope was opened in a single hot, nonblue lymph node measuring about 1.2 cm in diameter was identified.  Counts were approximate 1100.  Scanning through the axilla after  this node were removed showed counts of approximately 10.  Palpation showed no additional adenopathy.  The wound was irrigated with sterile water.  A 15-French Blake drain was placed and brought out through the inferior medial flap.  The skin flaps were closed with a running  2-0 Vicryl deep dermal suture in 2 segments.  Benzoin Steri-Strips followed by Telfa Telfa, fluff gauze and a compressive wrap were applied.  The drains were placed to self suction.  The patient tolerated the procedure well and was taken to the recovery room in stable condition.

## 2019-10-13 NOTE — Transfer of Care (Signed)
Immediate Anesthesia Transfer of Care Note  Patient: Tina Curtis  Procedure(s) Performed: MASTECTOMY MODIFIED RADICAL (Right Breast) SIMPLE MASTECTOMY TRUE CUT BIOPSY, SENTINEL NODE BIOPSY (Left Breast)  Patient Location: PACU  Anesthesia Type:General  Level of Consciousness: sedated  Airway & Oxygen Therapy: Patient Spontanous Breathing and Patient connected to face mask oxygen  Post-op Assessment: Report given to RN and Post -op Vital signs reviewed and stable  Post vital signs: Reviewed and stable  Last Vitals:  Vitals Value Taken Time  BP 144/71 10/13/19 1625  Temp 36.6 C 10/13/19 1625  Pulse 62 10/13/19 1632  Resp 8 10/13/19 1632  SpO2 100 % 10/13/19 1632  Vitals shown include unvalidated device data.  Last Pain:  Vitals:   10/13/19 1245  TempSrc: Oral  PainSc: 0-No pain         Complications: No apparent anesthesia complications

## 2019-10-13 NOTE — Anesthesia Procedure Notes (Signed)
Procedure Name: LMA Insertion Date/Time: 10/13/2019 1:32 PM Performed by: Lily Peer, Summer, RN Pre-anesthesia Checklist: Patient identified, Patient being monitored, Emergency Drugs available and Suction available Patient Re-evaluated:Patient Re-evaluated prior to induction Oxygen Delivery Method: Circle system utilized Preoxygenation: Pre-oxygenation with 100% oxygen Induction Type: IV induction Ventilation: Mask ventilation without difficulty LMA: LMA inserted LMA Size: 4.0 Tube type: Oral Number of attempts: 1 Placement Confirmation: positive ETCO2 and breath sounds checked- equal and bilateral Tube secured with: Tape Dental Injury: Teeth and Oropharynx as per pre-operative assessment

## 2019-10-14 DIAGNOSIS — C50911 Malignant neoplasm of unspecified site of right female breast: Secondary | ICD-10-CM | POA: Diagnosis not present

## 2019-10-14 MED ORDER — HYDROCODONE-ACETAMINOPHEN 5-325 MG PO TABS
0.5000 | ORAL_TABLET | ORAL | 0 refills | Status: DC | PRN
Start: 2019-10-14 — End: 2020-08-26

## 2019-10-14 NOTE — Plan of Care (Addendum)
Continuing with plan of care.  Discharge teaching completed with patient and niece who verbalize understanding.  Patient in stable condition and has all belongings.  Return demonstration of J/P drains as well.

## 2019-10-14 NOTE — Final Progress Note (Addendum)
AVSS. Feeling well. No pain, no nausea. Sats low on RA. In bed all night.  Will ambulate around the RN station after breakfast, I f> 88%, home.  She will do better out of the hospital than in. Flaps healthy. Drainage: Red pop. Reviewed drain care with patient. RN will repeat with family.  Home to day with office f/u in 5 days.

## 2019-10-17 NOTE — Progress Notes (Signed)
Phoned patient post-op.  Doing well. Notified Dr. Rogue Bussing that patient needs follow-up appointment.

## 2019-10-17 NOTE — Discharge Summary (Signed)
Physician Discharge Summary  Patient ID: Tina Curtis MRN: [id number] [name]/AGE: 18-Jun-1926 86 y.o.  Admit date: 10/13/2019 Discharge date: 10/17/2019  Admission Diagnoses:  Right breast cancer, suspected bilateral breast cancer  Discharge Diagnoses:  Active Problems:   Breast cancer [facility])   Discharged Condition: good  Hospital Course: Patient underwent bilateral mastectomy and was observed overnight. No pain.  Able to assist with ADL.   Consults: None  Significant Diagnostic Studies: None  Treatments: IV hydration  Discharge Exam: Blood pressure (!) 154/69, pulse 65, temperature 97.6 F (36.4 C), temperature source Oral, resp. rate 12, height 5\' 4"  (1.626 m), weight 63.5 kg, SpO2 97 %. General appearance: alert and cooperative Resp: clear to auscultation bilaterally Chest wall: no tenderness, Flaps healthy.  Cardio: regular rate and rhythm, S1, S2 normal, no murmur, click, rub or gallop  Disposition: Discharge disposition: 01-Home or Self Care       Discharge Instructions    Care order/instruction   Complete by: As directed    Please send patient home with JP drain instruction sheet, recording sheet and measuring cup. Review procedure with niece at discharge. Thanks.   Diet - low sodium heart healthy   Complete by: As directed    Discharge instructions   Complete by: As directed    Keep compressive wrap dry.  Empty and record drainage (right and left separately) 2-3 x/ day.  Bring record to all appointments.  Tylenol: If needed for soreness.  Norco (hydrocodone):  1/2 -1 tablet every 4 hours if needed for pain.  This medication may constipate.  Laxative of choice if needed.  Avoid repetitive activities with your arms such as mopping/ vacuuming or ironing.  Do not resume plavix at this time.  You may continue your daily aspirin tablet.   Discharge wound care:   Complete by: As directed    Empty and record drainage 2-3 x / day.   Keep pink dressing dry.   Increase activity slowly   Complete by: As directed      Allergies as of 10/14/2019      Reactions   Penicillins Anaphylaxis   Drug Ingredient [zinc]       Medication List    TAKE these medications   allopurinol 300 MG tablet Commonly known as: ZYLOPRIM Take 300 mg by mouth daily.   amLODipine 5 MG tablet Commonly known as: NORVASC Take 2.5 mg by mouth daily.   aspirin EC 81 MG tablet Take 81 mg by mouth daily.   Baclofen 5 MG Tabs Take 2.5 mg by mouth in the morning and at bedtime.   citalopram 10 MG tablet Commonly known as: CELEXA Take 10 mg by mouth daily.   clopidogrel 75 MG tablet Commonly known as: PLAVIX TAKE 1 TABLET BY MOUTH EVERY DAY   docusate sodium 100 MG capsule Commonly known as: COLACE Take 100 mg by mouth daily as needed for mild constipation.   DULoxetine 60 MG capsule Commonly known as: CYMBALTA Take 60 mg by mouth daily.   furosemide 20 MG tablet Commonly known as: LASIX Take 20 mg by mouth daily.   gabapentin 100 MG capsule Commonly known as: NEURONTIN Take 100 mg by mouth at bedtime.   hydrALAZINE 50 MG tablet Commonly known as: APRESOLINE Take 1 tablet (50 mg total) by mouth 2 (two) times daily.   HYDROcodone-acetaminophen 5-325 MG tablet Commonly known as: NORCO/VICODIN Take 0.5-1 tablets by mouth every 4 (four) hours as needed for moderate pain.   ICAPS PO Take 1 capsule  by mouth in the morning and at bedtime.   lidocaine-prilocaine cream Commonly known as: EMLA Apply 1 application topically as needed (to breast).   loratadine 10 MG tablet Commonly known as: CLARITIN Take 10 mg by mouth daily as needed for allergies. AM   metoprolol succinate 25 MG 24 hr tablet Commonly known as: TOPROL-XL Take 37.5 mg by mouth at bedtime.   polyethylene glycol 17 g packet Commonly known as: MIRALAX / GLYCOLAX Take 17 g by mouth daily as needed.   simvastatin 20 MG tablet Commonly known as: ZOCOR Take 20 mg by mouth every evening.  PM   VITAMIN D PO Take 1 tablet by mouth daily as needed (winter months).            Discharge Care Instructions  (From admission, onward)         Start     Ordered   10/14/19 0000  Discharge wound care:    Comments: Empty and record drainage 2-3 x / day.   Keep pink dressing dry.   10/14/19 2637         Follow-up Information    Tina Curtis, Tina Gleason, MD. Call.   Specialties: General Surgery, Radiology Why: Please call on Monday, June 7 for an appointment on June 10.  [phone] Contact information: [address] [address] [phone]           Signed: Robert Bellow 10/17/2019, 11:32 AM

## 2019-10-18 ENCOUNTER — Other Ambulatory Visit: Payer: Self-pay | Admitting: *Deleted

## 2019-10-18 ENCOUNTER — Other Ambulatory Visit: Payer: Self-pay | Admitting: Anatomic Pathology & Clinical Pathology

## 2019-10-20 ENCOUNTER — Other Ambulatory Visit: Payer: Self-pay

## 2019-10-20 ENCOUNTER — Encounter: Payer: Self-pay | Admitting: Internal Medicine

## 2019-10-20 ENCOUNTER — Inpatient Hospital Stay: Payer: Medicare PPO | Attending: Internal Medicine | Admitting: Internal Medicine

## 2019-10-20 DIAGNOSIS — Z7982 Long term (current) use of aspirin: Secondary | ICD-10-CM | POA: Diagnosis not present

## 2019-10-20 DIAGNOSIS — Z170 Estrogen receptor positive status [ER+]: Secondary | ICD-10-CM | POA: Insufficient documentation

## 2019-10-20 DIAGNOSIS — M1990 Unspecified osteoarthritis, unspecified site: Secondary | ICD-10-CM | POA: Diagnosis not present

## 2019-10-20 DIAGNOSIS — Z79899 Other long term (current) drug therapy: Secondary | ICD-10-CM | POA: Insufficient documentation

## 2019-10-20 DIAGNOSIS — C50211 Malignant neoplasm of upper-inner quadrant of right female breast: Secondary | ICD-10-CM | POA: Diagnosis not present

## 2019-10-20 DIAGNOSIS — Z9013 Acquired absence of bilateral breasts and nipples: Secondary | ICD-10-CM | POA: Insufficient documentation

## 2019-10-20 DIAGNOSIS — E7800 Pure hypercholesterolemia, unspecified: Secondary | ICD-10-CM | POA: Insufficient documentation

## 2019-10-20 DIAGNOSIS — M109 Gout, unspecified: Secondary | ICD-10-CM | POA: Insufficient documentation

## 2019-10-20 DIAGNOSIS — I129 Hypertensive chronic kidney disease with stage 1 through stage 4 chronic kidney disease, or unspecified chronic kidney disease: Secondary | ICD-10-CM | POA: Diagnosis not present

## 2019-10-20 DIAGNOSIS — N184 Chronic kidney disease, stage 4 (severe): Secondary | ICD-10-CM | POA: Diagnosis not present

## 2019-10-20 DIAGNOSIS — Z85828 Personal history of other malignant neoplasm of skin: Secondary | ICD-10-CM | POA: Diagnosis not present

## 2019-10-20 NOTE — Progress Notes (Signed)
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 Burnett] stage III T2N3 [19/24 LN]; ER/PR positive HER-2 negative.   # Left breast-invasive mammary carcinoma-status post mastectomy pT1cN1a; stage I- biomarker panel pending ----------------------------------------------------------------------------------------   A. RIGHT BREAST, 3:00; ULTRASOUND-GUIDED BIOPSY:  - INVASIVE MAMMARY CARCINOMA WITH MICROPAPILLARY FEATURES; Size of invasive carcinoma: 58m in this sample  Histologic grade of invasive carcinoma: Grade 2 ; ER/PR > 905: her 2- NEG  A. RIGHT BREAST, 3:00; ULTRASOUND-GUIDED BIOPSY: - INVASIVE MAMMARY CARCINOMA WITH MICROPAPILLARY FEATURES. 122min this sample. Grade 2. Lymphovascular invasion: Present.   B. LYMPH NODE, RIGHT AXILLARY; ULTRASOUND-GUIDED NEEDLE CORE BIOPSY: - METASTATIC MAMMARY CARCINOMA, 6 MM IN THIS SAMPLE.   C. RIGHT BREAST, LATERAL CALCIFICATIONS; STEREOTACTIC BIOPSY: - DUCTAL CARCINOMA IN SITU, INTERMEDIATE GRADE, WITH CALCIFICATIONS. -------------------------------------------------------------   # CKD- Stage III-IV [Dr.Kolluru]  # SURVIVORSHIP:   # GENETICS:   DIAGNOSIS: Right breast cancer stage III; left breast cancer-stage I GOALS: Cure  CURRENT/MOST RECENT THERAPY : Radiation/[facility]    Carcinoma of upper-inner quadrant of right breast in [place], estrogen receptor positive [place] Initial Diagnosis   Carcinoma of upper-inner quadrant of right breast in [place], estrogen receptor positive ([facility]     HISTORY OF PRESENTING ILLNESS:  FaLAYCE SPRUNG18.[id number].  female new diagnosis of bilateral breast cancer is here  for follow-up post surgery.  Patient underwent bilateral mastectomy; currently has drains in place.  She is recovering fairly well from the surgery.  Denies having any infection issues.   Review of Systems  Constitutional: Negative for chills, diaphoresis, fever, malaise/fatigue and weight loss.  HENT: Negative for nosebleeds and sore throat.   Eyes: Negative for double vision.  Respiratory: Negative for cough, hemoptysis, sputum production, shortness of breath and wheezing.   Cardiovascular: Negative for chest pain, palpitations, orthopnea and leg swelling.  Gastrointestinal: Negative for abdominal pain, blood in stool, constipation, diarrhea, heartburn, melena, nausea and vomiting.  Genitourinary: Negative for dysuria, frequency and urgency.  Musculoskeletal: Positive for back pain. Negative for joint pain.  Skin: Negative.  Negative for itching and rash.  Neurological: Negative for dizziness, tingling, focal weakness, weakness and headaches.  Endo/Heme/Allergies: Does not bruise/bleed easily.  Psychiatric/Behavioral: Negative for depression. The patient is not nervous/anxious and does not have insomnia.      MEDICAL HISTORY:  Past Medical History:  Diagnosis Date  . Arthritis    Gout  . Chronic kidney disease   . Diarrhea   . Hypercholesteremia   . Hypertension   . Neuropathy    feet and lower legs  . Seasonal allergies   . Skin cancer    face    SURGICAL HISTORY: Past Surgical History:  Procedure Laterality Date  . ABDOMINAL HYSTERECTOMY  2000  . APPENDECTOMY    . BREAST BIOPSY Right 09/19/2019   affirm bx of calcs UOQ, x marker, path pending  . BREAST BIOPSY Right 09/19/2019   usKoreax of mass,heart marker, path pending  . BREAST BIOPSY Right 09/19/2019   usKoreax of LN, coil marker, path pending  . CATARACT EXTRACTION Left   . CATARACT EXTRACTION W/PHACO Right 10/24/2014   Procedure: CATARACT EXTRACTION PHACO AND INTRAOCULAR LENS PLACEMENT (IOC);  Surgeon: ChLeandrew KoyanagiMD;  Location: [facility];  Service: Ophthalmology;  Laterality: Right;  . ESOPHAGOGASTRODUODENOSCOPY N/A 03/07/2018   Procedure: ESOPHAGOGASTRODUODENOSCOPY (EGD);  Surgeon: Vanga, Rohini Reddy, MD;  Location: [facility];  Service: Gastroenterology;  Laterality: N/A;  . MASTECTOMY MODIFIED RADICAL Right 10/13/2019   Procedure: MASTECTOMY MODIFIED RADICAL;  Surgeon: Byrnett, Jeffrey W, MD;  Location: [facility];  Service: General;  Laterality: Right;  . RENAL ANGIOGRAPHY Right 04/11/2018   Procedure: RENAL ANGIOGRAPHY;  Surgeon: Dew, Jason S, MD;  Location: [facility];  Service: Cardiovascular;  Laterality: Right;  . SIMPLE MASTECTOMY WITH AXILLARY SENTINEL NODE BIOPSY Left 10/13/2019   Procedure: SIMPLE MASTECTOMY TRUE CUT BIOPSY, SENTINEL NODE BIOPSY;  Surgeon: Byrnett, Jeffrey W, MD;  Location: [facility];  Service: General;  Laterality: Left;    SOCIAL HISTORY: Social History   Socioeconomic History  . Marital status: Widowed    Spouse name: Not on file  . Number of children: Not on file  . Years of education: Not on file  . Highest education level: Not on file  Occupational History  . Not on file  Tobacco Use  . Smoking status: Never Smoker  . Smokeless tobacco: Never Used  Substance and Sexual Activity  . Alcohol use: No  . Drug use: Never  . Sexual activity: Not on file  Other Topics Concern  . Not on file  Social History Narrative   2 daughters; lives in [place]; teacher retd. 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    Social Determinants of Health   Financial Resource Strain:   . Difficulty of Paying Living Expenses:   Food Insecurity:   . Worried About Running Out of Food in the Last Year:   . Ran Out of Food in the Last Year:   Transportation Needs:   . Lack of Transportation (Medical):   . Lack of Transportation (Non-Medical):   Physical Activity:   . Days of Exercise per Week:   . Minutes of Exercise per Session:    Stress:   . Feeling of Stress :   Social Connections:   . Frequency of Communication with Friends and Family:   . Frequency of Social Gatherings with Friends and Family:   . Attends Religious Services:   . Active Member of Clubs or Organizations:   . Attends Club or Organization Meetings:   . Marital Status:   Intimate Partner Violence:   . Fear of Current or Ex-Partner:   . Emotionally Abused:   . Physically Abused:   . Sexually Abused:     FAMILY HISTORY: Family History  Problem Relation Age of Onset  . Hypertension Mother   . Hypertension Father   . Stroke Father   . Breast cancer Daughter 54    ALLERGIES:  is allergic to penicillins and drug ingredient [zinc].  MEDICATIONS:  Current Outpatient Medications  Medication Sig Dispense Refill  . allopurinol (ZYLOPRIM) 300 MG tablet Take 300 mg by mouth daily.     . amLODipine (NORVASC) 5 MG tablet Take 2.5 mg by mouth daily.   1  . aspirin EC 81 MG tablet Take 81 mg by mouth daily.    . Baclofen 5 MG TABS Take 2.5 mg by mouth in the morning and at bedtime.     . Cholecalciferol (VITAMIN D PO) Take 1 tablet by mouth daily as needed (winter months).     . citalopram (CELEXA) 10 MG tablet Take 10 mg by mouth daily.     . clopidogrel (PLAVIX) 75 MG tablet   TAKE 1 TABLET BY MOUTH EVERY DAY 30 tablet 1  . docusate sodium (COLACE) 100 MG capsule Take 100 mg by mouth daily as needed for mild constipation.    . DULoxetine (CYMBALTA) 60 MG capsule Take 60 mg by mouth daily.     . furosemide (LASIX) 20 MG tablet Take 20 mg by mouth daily.     . gabapentin (NEURONTIN) 100 MG capsule Take 100 mg by mouth at bedtime.     . hydrALAZINE (APRESOLINE) 50 MG tablet Take 1 tablet (50 mg total) by mouth 2 (two) times daily. 30 tablet 0  . HYDROcodone-acetaminophen (NORCO/VICODIN) 5-325 MG tablet Take 0.5-1 tablets by mouth every 4 (four) hours as needed for moderate pain. 12 tablet 0  . lidocaine-prilocaine (EMLA) cream Apply 1 application  topically as needed (to breast).    . loratadine (CLARITIN) 10 MG tablet Take 10 mg by mouth daily as needed for allergies. AM    . metoprolol succinate (TOPROL-XL) 25 MG 24 hr tablet Take 37.5 mg by mouth at bedtime.     . Multiple Vitamins-Minerals (ICAPS PO) Take 1 capsule by mouth in the morning and at bedtime.     . polyethylene glycol (MIRALAX / GLYCOLAX) packet Take 17 g by mouth daily as needed. 60 each 0  . simvastatin (ZOCOR) 20 MG tablet Take 20 mg by mouth every evening. PM      No current facility-administered medications for this visit.      .  PHYSICAL EXAMINATION: ECOG PERFORMANCE STATUS: 0 - Asymptomatic  Vitals:   10/20/19 1006  BP: (!) 147/80  Pulse: 71  Temp: 98.8 F (37.1 C)   Filed Weights   10/20/19 1006  Weight: 141 lb 3.2 oz (64 kg)    Physical Exam Constitutional:      Comments: Walks with a cane.with daughter.   HENT:     Head: Normocephalic and atraumatic.     Mouth/Throat:     Pharynx: No oropharyngeal exudate.  Eyes:     Pupils: Pupils are equal, round, and reactive to light.  Cardiovascular:     Rate and Rhythm: Normal rate and regular rhythm.  Pulmonary:     Effort: Pulmonary effort is normal. No respiratory distress.     Breath sounds: Normal breath sounds. No wheezing.  Abdominal:     General: Bowel sounds are normal. There is no distension.     Palpations: Abdomen is soft. There is no mass.     Tenderness: There is no abdominal tenderness. There is no guarding or rebound.  Musculoskeletal:        General: No tenderness. Normal range of motion.     Cervical back: Normal range of motion and neck supple.  Skin:    General: Skin is warm.     Comments:    Neurological:     Mental Status: She is alert and oriented to person, place, and time.  Psychiatric:        Mood and Affect: Affect normal.    LABORATORY DATA:  I have reviewed the data as listed Lab Results  Component Value Date   WBC 7.7 09/27/2019   HGB 13.6 09/27/2019    HCT 41.3 09/27/2019   MCV 93.2 09/27/2019   PLT 198 09/27/2019   Recent Labs    09/27/19 1240  NA 141  K 4.8  CL 104  CO2 27  GLUCOSE 96  BUN 51*  CREATININE 1.75*  CALCIUM 9.7  GFRNONAA 25*  GFRAA 29*  PROT 8.2*    ALBUMIN 4.6  AST 27  ALT 19  ALKPHOS 114  BILITOT 0.6    RADIOGRAPHIC STUDIES: I have personally reviewed the radiological images as listed and agreed with the findings in the report. NM PET Image Initial (PI) Skull Base To Thigh  Result Date: 10/04/2019 CLINICAL DATA:  Initial treatment strategy for carcinoma of upper inner quadrant of right breast. Status post right breast and axillary biopsy. EXAM: NUCLEAR MEDICINE PET SKULL BASE TO THIGH TECHNIQUE: 7.8 mCi F-18 FDG was injected intravenously. Full-ring PET imaging was performed from the skull base to thigh after the radiotracer. CT data was obtained and used for attenuation correction and anatomic localization. Fasting blood glucose: 97 mg/dl COMPARISON:  Abdominal MRI of 04/13/2018. 03/05/2018 abdominopelvic CT. FINDINGS: Mediastinal blood pool activity: SUV max 2.2 Liver activity: SUV max NA NECK: No areas of abnormal hypermetabolism. Incidental CT findings: Bilateral carotid atherosclerosis. No cervical adenopathy. CHEST: Right subpectoral hypermetabolic adenopathy. Example at 9 mm and a S.U.V. max of 5.0 on 70/3. Hypermetabolic right axillary nodes, including at 1.0 cm and a S.U.V. max of 3.1 on 89/3. Medial right breast hypermetabolic lesion measures 2.2 cm and a S.U.V. max of 8.4 on 115/3. A focus of hypermetabolism within the 6 o'clock position of the left breast corresponds to a subtle nodule of 11 mm and a S.U.V. max of 2.7 on 126/3. No pulmonary parenchymal hypermetabolism. Incidental CT findings: Aortic atherosclerosis. Mild cardiomegaly. Lad coronary artery calcification. Centrilobular emphysema. ABDOMEN/PELVIS: No abdominopelvic parenchymal or nodal hypermetabolism. Incidental CT findings: Moderate left  renal atrophy. Normal adrenal glands. Old granulomatous disease in the liver. Infrarenal abdominal aortic ectasia at on the order of 3.3 x 3.1 cm. Hysterectomy. SKELETON: No abnormal marrow activity. Incidental CT findings: Osteopenia. IMPRESSION: 1. Right breast primary with axillary and subpectoral nodal metastasis. 2. Inferior left breast hypermetabolic nodule, suspicious for a contralateral breast primary. 3. Incidental findings, including: Aortic atherosclerosis (ICD10-I70.0), coronary artery atherosclerosis and emphysema (ICD10-J43.9). Left renal atrophy. Infrarenal aortic ectasia. Electronically Signed   By: Kyle  Talbot M.D.   On: 10/04/2019 12:57   NM SENTINEL NODE INJECTION  Result Date: 10/13/2019 CLINICAL DATA:  Left breast cancer. EXAM: NUCLEAR MEDICINE BREAST LYMPHOSCINTIGRAPHY TECHNIQUE: Intradermal injection of radiopharmaceutical was performed at the 12 o'clock, 3 o'clock, 6 o'clock, and 9 o'clock positions around the left nipple. The patient was then sent to the operating room where the sentinel node(s) were identified and removed by the surgeon. RADIOPHARMACEUTICALS:  Total of 1 mCi Millipore-filtered Technetium-99m sulfur colloid, injected in four aliquots of 0.25 mCi each. IMPRESSION: Uncomplicated intradermal injection of a total of 1 mCi Technetium-99m sulfur colloid for purposes of sentinel node identification. Electronically Signed   By: Glenn  Yamagata M.D.   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   # Left breast-invasive mammary carcinoma- pT1cN1a; biomarker panel pending  #Discussed with patient and daughter that unfortunately given the pathologic staging patient is high risk for  recurrence-both local and systemic.  We will make a referral to radiation oncology.  Discussed with Dr. Burnett.  We also reviewed the tumor conference.  #With regards to systemic therapy-patient a poor candidate for systemic chemotherapy given her age; comorbidities and frailty.  Patient understands the constraints; and declines chemotherapy.  Patient will be excellent   candidate for endocrine therapy.   # CKD- Stage- IV [Dr.Kolluru]  I spoke at length with the patient's daughter regarding the patient's clinical status/plan of care.  Daughter in agreement.    # DISPOSITION: # referral to Dr.Crystal/radiation re: breast cancer next week/daughter in town # follow up in 1 month- MD; labs; cbc/cmp/ca 27-29;ca 15-3;cea-Dr.B   Cc;Dr.Baboff     All questions were answered. The patient/family knows to call the clinic with any problems, questions or concerns.     Cammie Sickle, MD 10/20/2019 1:59 PM

## 2019-10-20 NOTE — Assessment & Plan Note (Addendum)
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   # Left breast-invasive mammary carcinoma- pT1cN1a; biomarker panel pending  #Discussed with patient and daughter that unfortunately given the pathologic staging patient is high risk for recurrence-both local and systemic.  We will make a referral to radiation oncology.  Discussed with Dr. Tollie Pizza.  We also reviewed the tumor conference.  #With regards to systemic therapy-patient a poor candidate for systemic chemotherapy given her age; comorbidities and frailty.  Patient understands the constraints; and declines chemotherapy.  Patient will be excellent candidate for endocrine therapy.   # CKD- Stage- IV [Dr.Kolluru]  I spoke at length with the patient's daughter regarding the patient's clinical status/plan of care.  Daughter in agreement.    # DISPOSITION: # referral to Dr.Crystal/radiation re: breast cancer next week/daughter in town # follow up in 1 month- MD; labs; cbc/cmp/ca 27-29;ca 15-3;cea-Dr.B   Cc;Dr.Baboff

## 2019-10-23 LAB — SURGICAL PATHOLOGY

## 2019-10-25 ENCOUNTER — Ambulatory Visit: Payer: Medicare PPO | Admitting: Podiatry

## 2019-10-26 ENCOUNTER — Encounter: Payer: Self-pay | Admitting: Radiation Oncology

## 2019-10-26 ENCOUNTER — Ambulatory Visit
Admission: RE | Admit: 2019-10-26 | Discharge: 2019-10-26 | Disposition: A | Discharge status: Home / Self Care | Payer: Medicare PPO | Source: Ambulatory Visit | Attending: Radiation Oncology | Admitting: Radiation Oncology

## 2019-10-26 ENCOUNTER — Other Ambulatory Visit: Payer: Self-pay

## 2019-10-26 VITALS — BP 123/78 | HR 61 | Temp 96.0°F | Resp 12 | Wt 141.0 lb

## 2019-10-26 DIAGNOSIS — N189 Chronic kidney disease, unspecified: Secondary | ICD-10-CM | POA: Insufficient documentation

## 2019-10-26 DIAGNOSIS — Z803 Family history of malignant neoplasm of breast: Secondary | ICD-10-CM | POA: Diagnosis not present

## 2019-10-26 DIAGNOSIS — E7800 Pure hypercholesterolemia, unspecified: Secondary | ICD-10-CM | POA: Diagnosis not present

## 2019-10-26 DIAGNOSIS — Z9013 Acquired absence of bilateral breasts and nipples: Secondary | ICD-10-CM | POA: Insufficient documentation

## 2019-10-26 DIAGNOSIS — C50211 Malignant neoplasm of upper-inner quadrant of right female breast: Secondary | ICD-10-CM | POA: Diagnosis not present

## 2019-10-26 DIAGNOSIS — C773 Secondary and unspecified malignant neoplasm of axilla and upper limb lymph nodes: Secondary | ICD-10-CM | POA: Insufficient documentation

## 2019-10-26 DIAGNOSIS — Z7982 Long term (current) use of aspirin: Secondary | ICD-10-CM | POA: Diagnosis not present

## 2019-10-26 DIAGNOSIS — I129 Hypertensive chronic kidney disease with stage 1 through stage 4 chronic kidney disease, or unspecified chronic kidney disease: Secondary | ICD-10-CM | POA: Insufficient documentation

## 2019-10-26 DIAGNOSIS — Z170 Estrogen receptor positive status [ER+]: Secondary | ICD-10-CM | POA: Diagnosis not present

## 2019-10-26 DIAGNOSIS — M129 Arthropathy, unspecified: Secondary | ICD-10-CM | POA: Insufficient documentation

## 2019-10-26 DIAGNOSIS — Z79899 Other long term (current) drug therapy: Secondary | ICD-10-CM | POA: Insufficient documentation

## 2019-10-26 DIAGNOSIS — Z923 Personal history of irradiation: Secondary | ICD-10-CM | POA: Insufficient documentation

## 2019-10-26 DIAGNOSIS — G629 Polyneuropathy, unspecified: Secondary | ICD-10-CM | POA: Diagnosis not present

## 2019-10-26 NOTE — Consult Note (Signed)
NEW PATIENT EVALUATION  Name: Tina Curtis  MRN: [id number]  Date:   10/26/2019     [name]: [birth year]   This 86 y.o. female patient presents to the clinic for initial evaluation of bilateral breast cancer right breast stage III (T2 N3 M0) ER/PR positive HER-2 negative.  REFERRING PHYSICIAN: Derinda Late, MD  CHIEF COMPLAINT:  Chief Complaint  Patient presents with  . Breast Cancer    DIAGNOSIS: The encounter diagnosis was Carcinoma of upper-inner quadrant of right breast in female, estrogen receptor positive ([place]).   PREVIOUS INVESTIGATIONS:  Pathology report reviewed Clinical notes reviewed Mammogram ultrasound and PET CT scan reviewed  HPI: Patient is a 86 year old female who presented with a self discovered mass in the right breast.  She was found to have a right breast mass at the 3 o'clock position ultrasound-guided biopsy positive for invasive mammary carcinoma with micropapillary features.  Initial ultrasound-guided biopsy of right axillary lymph node was also positive for metastatic carcinoma.  PET CT scan showed right primary breast cancer with axillary and subpectoral nodal metastasis.  She also had inferior left breast hypermetabolic nodule suspicious for contralateral primary breast cancer.  Patient underwent bilateral mastectomies with the right breast showing a 2.5 cm invasive mammary carcinoma overall grade 2.  Margins were clear.  19 of 24 lymph nodes were involved by metastatic carcinoma measuring up to 1.8 cm in greatest linear extent with extranodal extension.  Tumor was ER/PR positive HER-2/neu negative.  Left breast demonstrated a 1.7 cm overall grade 2 invasive mammary carcinoma with margins clear at 10 mm.  1 sentinel lymph node was positive for macro metastatic disease.  Lymphovascular invasion was present.  Tumor was [place].  Patient is seen today for consideration of adjuvant radiation therapy.  She still has drains present and incisions are healing well but  slowly healing.  She has been seen by medical oncology with recommendation for adjuvant antiestrogen therapy.  She is without complaint except for the recent surgery.  Case was presented at our weekly tumor conference.  PLANNED TREATMENT REGIMEN: Bilateral peripheral lymphatic breast radiation.  PAST MEDICAL HISTORY:  has a past medical history of Arthritis, Chronic kidney disease, Diarrhea, Hypercholesteremia, Hypertension, Neuropathy, Seasonal allergies, and Skin cancer.    PAST SURGICAL HISTORY:  Past Surgical History:  Procedure Laterality Date  . ABDOMINAL HYSTERECTOMY  2000  . APPENDECTOMY    . BREAST BIOPSY Right 09/19/2019   affirm bx of calcs UOQ, x marker, path pending  . BREAST BIOPSY Right 09/19/2019   [place] bx of mass,heart marker, path pending  . BREAST BIOPSY Right 09/19/2019   [place] bx of LN, coil marker, path pending  . CATARACT EXTRACTION Left   . CATARACT EXTRACTION W/PHACO Right 10/24/2014   Procedure: CATARACT EXTRACTION PHACO AND INTRAOCULAR LENS PLACEMENT (IOC);  Surgeon: Leandrew Koyanagi, MD;  Location: [place];  Service: Ophthalmology;  Laterality: Right;  . ESOPHAGOGASTRODUODENOSCOPY N/A 03/07/2018   Procedure: ESOPHAGOGASTRODUODENOSCOPY (EGD);  Surgeon: Lin Landsman, MD;  Location: [facility] ENDOSCOPY;  Service: Gastroenterology;  Laterality: N/A;  . MASTECTOMY MODIFIED RADICAL Right 10/13/2019   Procedure: MASTECTOMY MODIFIED RADICAL;  Surgeon: Robert Bellow, MD;  Location: [facility] ORS;  Service: General;  Laterality: Right;  . RENAL ANGIOGRAPHY Right 04/11/2018   Procedure: RENAL ANGIOGRAPHY;  Surgeon: Algernon Huxley, MD;  Location: [place] [facility];  Service: Cardiovascular;  Laterality: Right;  . SIMPLE MASTECTOMY WITH AXILLARY SENTINEL NODE BIOPSY Left 10/13/2019   Procedure: SIMPLE MASTECTOMY TRUE CUT BIOPSY, SENTINEL NODE  BIOPSY;  Surgeon: Robert Bellow, MD;  Location: [facility] ORS;  Service: General;  Laterality: Left;    FAMILY HISTORY:  family history includes Breast cancer (age of onset: 93) in her daughter; Hypertension in her father and mother; Stroke in her father.  SOCIAL HISTORY:  reports that she has never smoked. She has never used smokeless tobacco. She reports that she does not drink alcohol and does not use drugs.  ALLERGIES: Penicillins and Drug ingredient [zinc]  MEDICATIONS:  Current Outpatient Medications  Medication Sig Dispense Refill  . allopurinol (ZYLOPRIM) 300 MG tablet Take 300 mg by mouth daily.     Marland Kitchen amLODipine (NORVASC) 5 MG tablet Take 2.5 mg by mouth daily.   1  . aspirin EC 81 MG tablet Take 81 mg by mouth daily.    . Baclofen 5 MG TABS Take 2.5 mg by mouth in the morning and at bedtime.     . Cholecalciferol (VITAMIN D PO) Take 1 tablet by mouth daily as needed (winter months).     . citalopram (CELEXA) 10 MG tablet Take 10 mg by mouth daily.     . clopidogrel (PLAVIX) 75 MG tablet TAKE 1 TABLET BY MOUTH EVERY DAY 30 tablet 1  . docusate sodium (COLACE) 100 MG capsule Take 100 mg by mouth daily as needed for mild constipation.    . DULoxetine (CYMBALTA) 60 MG capsule Take 60 mg by mouth daily.     . furosemide (LASIX) 20 MG tablet Take 20 mg by mouth daily.     Marland Kitchen gabapentin (NEURONTIN) 100 MG capsule Take 100 mg by mouth at bedtime.     . hydrALAZINE (APRESOLINE) 50 MG tablet Take 1 tablet (50 mg total) by mouth 2 (two) times daily. 30 tablet 0  . lidocaine-prilocaine (EMLA) cream Apply 1 application topically as needed (to breast).    Marland Kitchen loratadine (CLARITIN) 10 MG tablet Take 10 mg by mouth daily as needed for allergies. AM    . metoprolol succinate (TOPROL-XL) 25 MG 24 hr tablet Take 37.5 mg by mouth at bedtime.     . Multiple Vitamins-Minerals (ICAPS PO) Take 1 capsule by mouth in the morning and at bedtime.     . polyethylene glycol (MIRALAX / GLYCOLAX) packet Take 17 g by mouth daily as needed. 60 each 0  . simvastatin (ZOCOR) 20 MG tablet Take 20 mg by mouth every evening. PM     .  HYDROcodone-acetaminophen (NORCO/VICODIN) 5-325 MG tablet Take 0.5-1 tablets by mouth every 4 (four) hours as needed for moderate pain. (Patient not taking: Reported on 10/26/2019) 12 tablet 0   No current facility-administered medications for this encounter.    ECOG PERFORMANCE STATUS:  0 - Asymptomatic  REVIEW OF SYSTEMS: Patient denies any weight loss, fatigue, weakness, fever, chills or night sweats. Patient denies any loss of vision, blurred vision. Patient denies any ringing  of the ears or hearing loss. No irregular heartbeat. Patient denies heart murmur or history of fainting. Patient denies any chest pain or pain radiating to her upper extremities. Patient denies any shortness of breath, difficulty breathing at night, cough or hemoptysis. Patient denies any swelling in the lower legs. Patient denies any nausea vomiting, vomiting of blood, or coffee ground material in the vomitus. Patient denies any stomach pain. Patient states has had normal bowel movements no significant constipation or diarrhea. Patient denies any dysuria, hematuria or significant nocturia. Patient denies any problems walking, swelling in the joints or loss of balance. Patient denies any  skin changes, loss of hair or loss of weight. Patient denies any excessive worrying or anxiety or significant depression. Patient denies any problems with insomnia. Patient denies excessive thirst, polyuria, polydipsia. Patient denies any swollen glands, patient denies easy bruising or easy bleeding. Patient denies any recent infections, allergies or URI. Patient "s visual fields have not changed significantly in recent time.   PHYSICAL EXAM: BP 123/78 (BP Location: Left Wrist, Patient Position: Sitting, Cuff Size: Small)   Pulse 61   Temp (!) 96 F (35.6 C) (Tympanic)   Resp 12   Wt 141 lb (64 kg)   BMI 24.20 kg/m  Elderly female in excellent overall condition.  She is status post bilateral mastectomies no chest wall mass or nodularity  is appreciated.  Incisions are healing well with drains in place.  No axillary or supraclavicular adenopathy is appreciated.  Well-developed well-nourished patient in NAD. HEENT reveals PERLA, EOMI, discs not visualized.  Oral cavity is clear. No oral mucosal lesions are identified. Neck is clear without evidence of cervical or supraclavicular adenopathy. Lungs are clear to A&P. Cardiac examination is essentially unremarkable with regular rate and rhythm without murmur rub or thrill. Abdomen is benign with no organomegaly or masses noted. Motor sensory and DTR levels are equal and symmetric in the upper and lower extremities. Cranial nerves II through XII are grossly intact. Proprioception is intact. No peripheral adenopathy or edema is identified. No motor or sensory levels are noted. Crude visual fields are within normal range.  LABORATORY DATA: Pathology report reviewed    RADIOLOGY RESULTS: Mammogram ultrasound and MRI scans reviewed   IMPRESSION: Bilateral breast cancer right breast stage III left breast with positive sentinel node in 86 year old female both tumors ER/PR positive.  Patient is status post bilateral mastectomies.  PLAN: This time I discussed the case at both medical tumor conference and personally with Dr. Tollie Pizza.  Would offer adjuvant radiation therapy to her bilateral peripheral lymphatics.  Would treat the 5040 cGy in 28 fractions.  Risks and benefits of treatment including possible lymphedema of her right upper extremity as well as skin reaction fatigue alteration of blood counts possible inclusion of superficial lung all were described in detail to the patient and her daughter.  I will out 2 more weeks for healing and have personally set up and ordered CT simulation at that time.  Patient comprehends my recommendations well.  I would like to take this opportunity to thank you for allowing me to participate in the care of your patient.Noreene Filbert, MD

## 2019-11-03 IMAGING — CT CT ABD-PELV W/O CM
2 of 3 series · 14 of 42 positions shown, 18 images · non-contrast
Comparison: None.

CLINICAL DATA: 89-year-old female with abdominal pain.

EXAM:
CT ABDOMEN AND PELVIS WITHOUT CONTRAST
TECHNIQUE: Multidetector CT imaging of the abdomen and pelvis was performed
following the standard protocol without IV contrast.

[Series 2: routine abd/pel wo · axial · 0.66mm/px · z∈[-391,-51]mm · 11 of 79 slices shown, 15 images]
[im 7/79  soft-tissue]
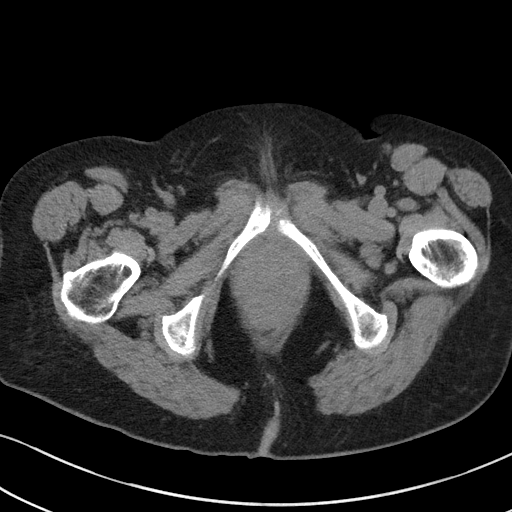
[im 7/79  bone]
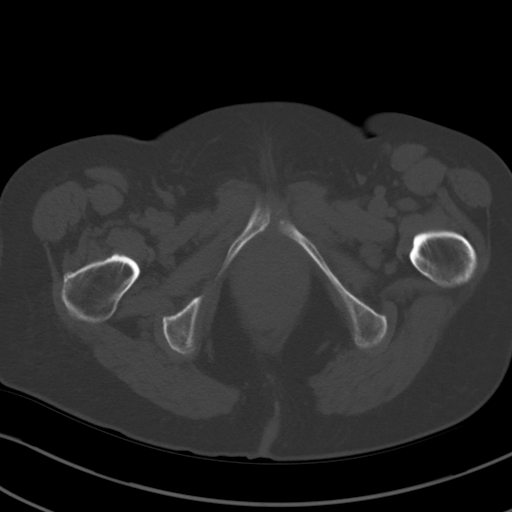
[im 14/79  soft-tissue]
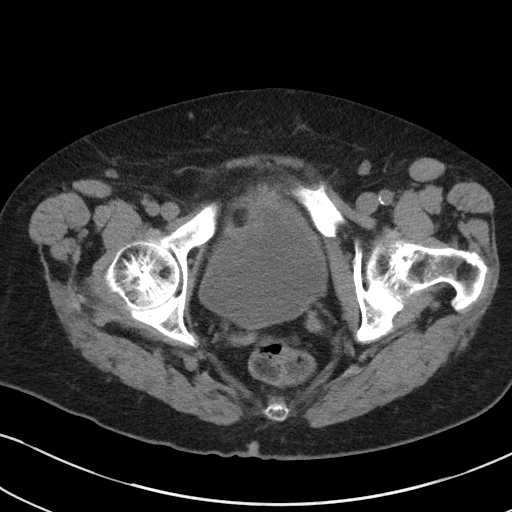
[im 23/79  soft-tissue]
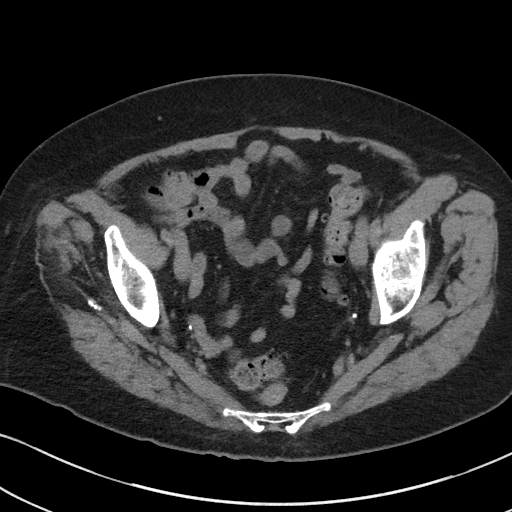
[im 30/79  soft-tissue]
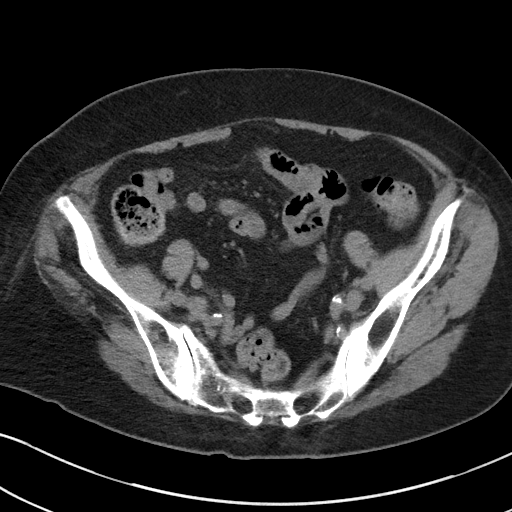
[im 40/79  soft-tissue]
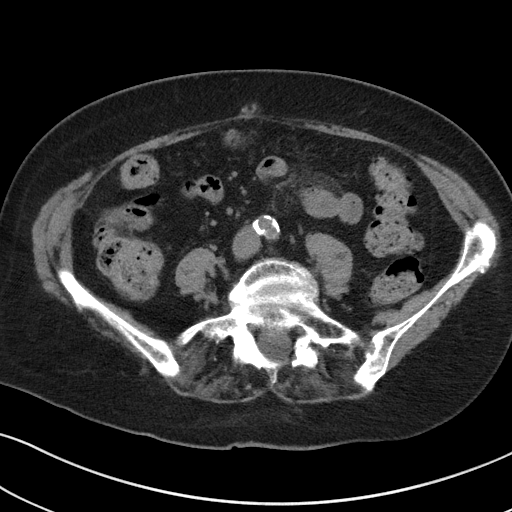
[im 49/79  soft-tissue]
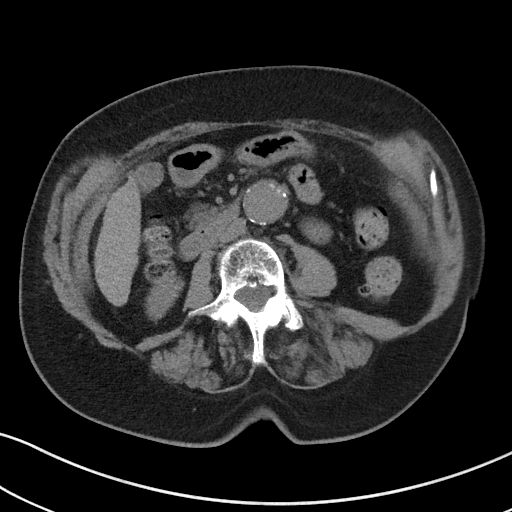
[im 56/79  soft-tissue]
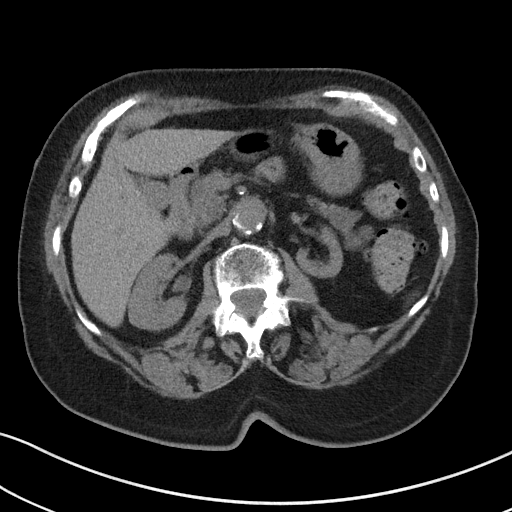
[im 66/79  soft-tissue]
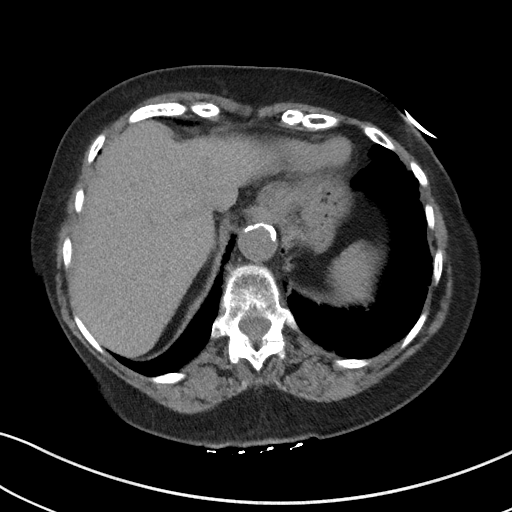
[im 66/79  lung]
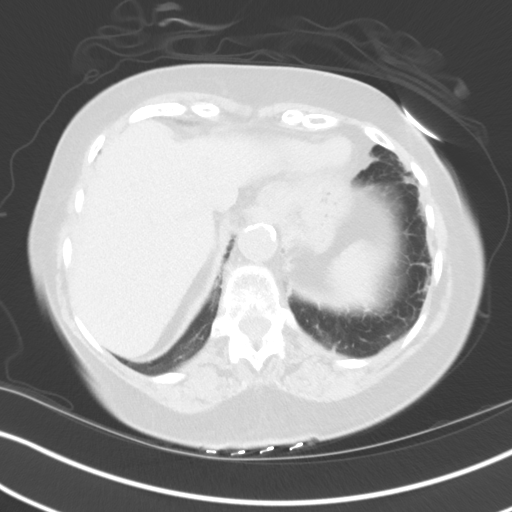
[im 69/79  lung]
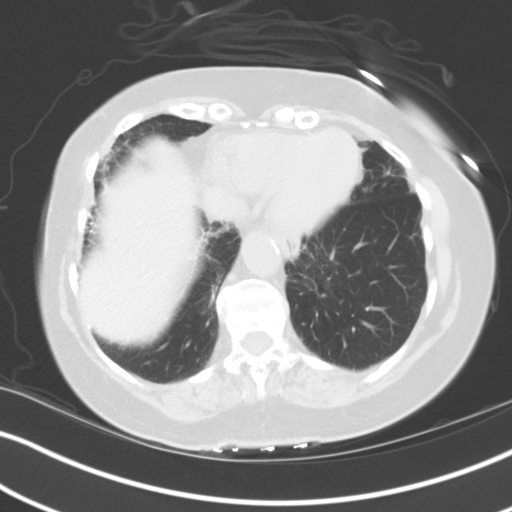
[im 72/79  soft-tissue]
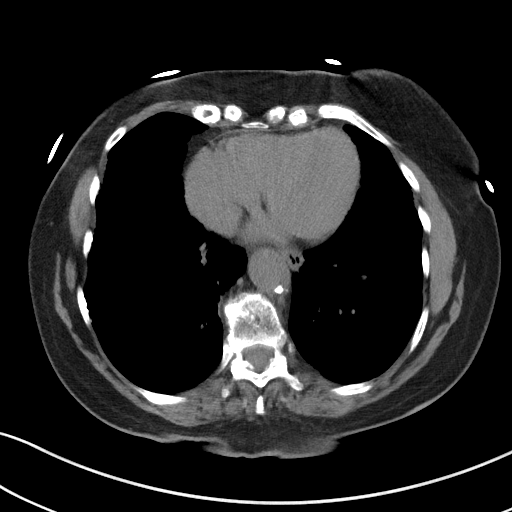
[im 72/79  lung]
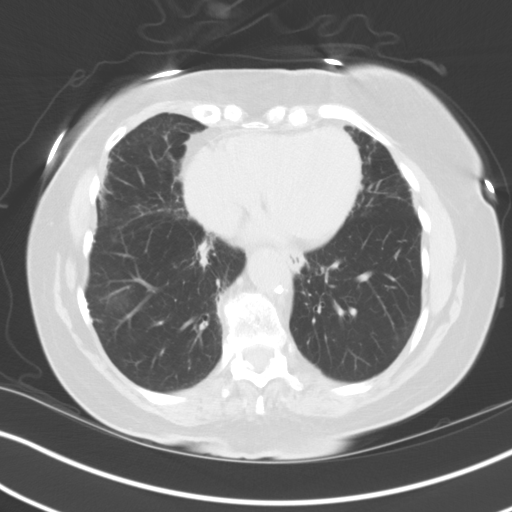
[im 72/79  bone]
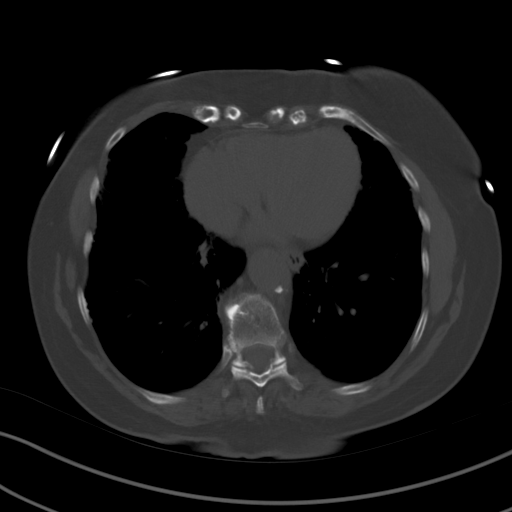
[im 75/79  lung]
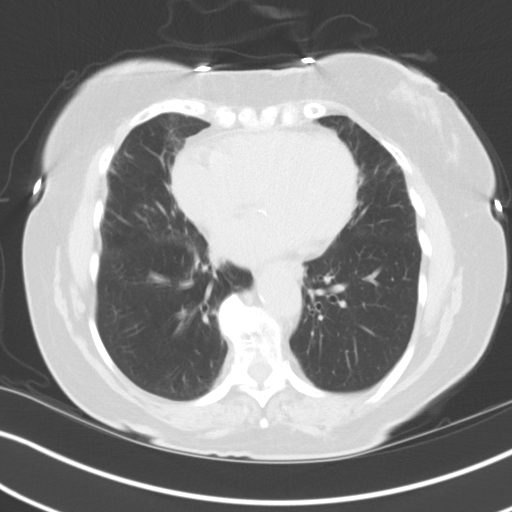

[Series 5: coronal st · coronal · 0.70mm/px · 3 of 94 slices shown]
[im 32/94  soft-tissue]
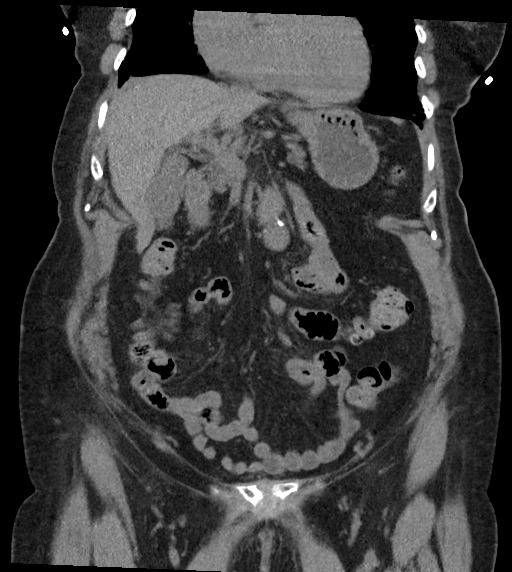
[im 42/94  soft-tissue]
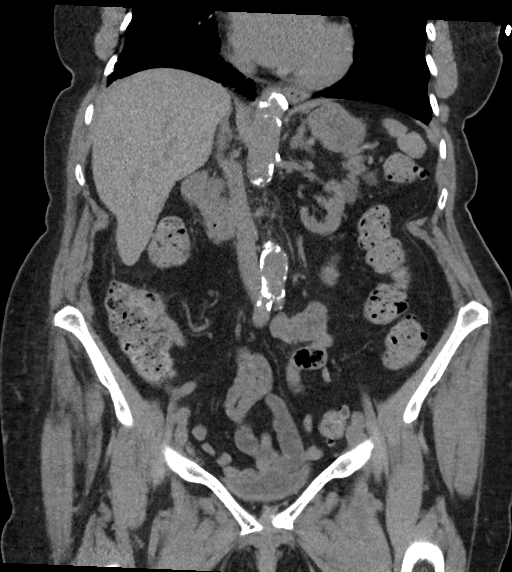
[im 52/94  soft-tissue]
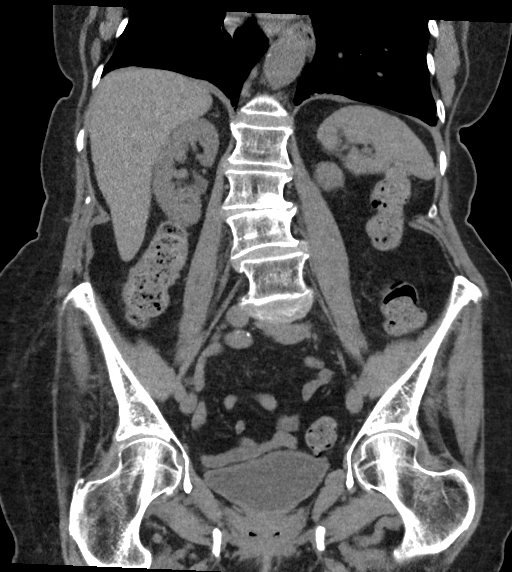

[14 of 42 positions shown; findings below may reference images not displayed]

FINDINGS: Evaluation of this exam is limited in the absence of intravenous
contrast.

Lower chest: There are minimal bibasilar subpleural scarring. There
is coronary vascular calcification.

No intra-abdominal free air or free fluid.

Hepatobiliary: The liver is unremarkable. No calcified gallstone.

Pancreas: There are multiple low attenuating/cystic lesions
involving the pancreas which are not characterized on this
noncontrast CT but may represent side branch IPMNs. Further
evaluation with MRI is recommended. The largest measures
approximately 16 mm in the proximal body of the pancreas.

Spleen: Normal in size without focal abnormality.

Adrenals/Urinary Tract: The adrenal glands are unremarkable.
Moderate left renal atrophy. There is no hydronephrosis or
nephrolithiasis on either side. The visualized ureters and urinary
bladder appear unremarkable.

Stomach/Bowel: There is extensive sigmoid and colonic diverticulosis
without active inflammatory changes. There is moderate amount of
stool throughout the colon. There is no bowel obstruction or active
inflammation. Appendectomy.

Vascular/Lymphatic: Advanced aortoiliac atherosclerotic disease. A
3.3 cm infrarenal abdominal aortic aneurysm. No portal venous gas.
There is no adenopathy.

Reproductive: Hysterectomy. No pelvic mass.

Other: None

Musculoskeletal: Osteopenia with degenerative changes of the spine.
No acute osseous pathology.
IMPRESSION: 1. Moderate colonic stool burden. No bowel obstruction or active
inflammation.
2. Colonic diverticulosis.
3. Multiple pancreatic cystic lesions as described. Further
evaluation with MRI is recommended.
4. A 3.3 cm infrarenal abdominal aortic aneurysm. Recommend followup
by ultrasound in 3 years. This recommendation follows ACR consensus
guidelines: White Paper of the ACR Incidental Findings Committee II

## 2019-11-08 ENCOUNTER — Ambulatory Visit
Admission: RE | Admit: 2019-11-08 | Discharge: 2019-11-08 | Disposition: A | Discharge status: Home / Self Care | Payer: Medicare PPO | Source: Ambulatory Visit | Attending: Radiation Oncology | Admitting: Radiation Oncology

## 2019-11-08 DIAGNOSIS — C50911 Malignant neoplasm of unspecified site of right female breast: Secondary | ICD-10-CM | POA: Diagnosis present

## 2019-11-08 DIAGNOSIS — C50912 Malignant neoplasm of unspecified site of left female breast: Secondary | ICD-10-CM | POA: Insufficient documentation

## 2019-11-08 DIAGNOSIS — Z170 Estrogen receptor positive status [ER+]: Secondary | ICD-10-CM | POA: Insufficient documentation

## 2019-11-17 ENCOUNTER — Encounter: Payer: Self-pay | Admitting: Internal Medicine

## 2019-11-17 ENCOUNTER — Other Ambulatory Visit: Payer: Self-pay | Admitting: *Deleted

## 2019-11-17 ENCOUNTER — Other Ambulatory Visit: Payer: Self-pay

## 2019-11-17 ENCOUNTER — Inpatient Hospital Stay (HOSPITAL_BASED_OUTPATIENT_CLINIC_OR_DEPARTMENT_OTHER): Payer: Medicare PPO | Admitting: Internal Medicine

## 2019-11-17 ENCOUNTER — Inpatient Hospital Stay: Payer: Medicare PPO | Attending: Oncology

## 2019-11-17 DIAGNOSIS — Z170 Estrogen receptor positive status [ER+]: Secondary | ICD-10-CM | POA: Diagnosis not present

## 2019-11-17 DIAGNOSIS — R5383 Other fatigue: Secondary | ICD-10-CM | POA: Diagnosis not present

## 2019-11-17 DIAGNOSIS — C50211 Malignant neoplasm of upper-inner quadrant of right female breast: Secondary | ICD-10-CM

## 2019-11-17 DIAGNOSIS — Z9013 Acquired absence of bilateral breasts and nipples: Secondary | ICD-10-CM | POA: Diagnosis not present

## 2019-11-17 DIAGNOSIS — N184 Chronic kidney disease, stage 4 (severe): Secondary | ICD-10-CM | POA: Insufficient documentation

## 2019-11-17 DIAGNOSIS — C50912 Malignant neoplasm of unspecified site of left female breast: Secondary | ICD-10-CM | POA: Diagnosis not present

## 2019-11-17 LAB — CBC WITH DIFFERENTIAL/PLATELET
Abs Immature Granulocytes: 0.03 10*3/uL (ref 0.00–0.07)
Basophils Absolute: 0.1 10*3/uL (ref 0.0–0.1)
Basophils Relative: 1 %
Eosinophils Absolute: 0.6 10*3/uL — ABNORMAL HIGH (ref 0.0–0.5)
Eosinophils Relative: 10 %
HCT: 36.9 % (ref 36.0–46.0)
Hemoglobin: 12.7 g/dL (ref 12.0–15.0)
Immature Granulocytes: 1 %
Lymphocytes Relative: 28 %
Lymphs Abs: 1.7 10*3/uL (ref 0.7–4.0)
MCH: 32.2 pg (ref 26.0–34.0)
MCHC: 34.4 g/dL (ref 30.0–36.0)
MCV: 93.7 fL (ref 80.0–100.0)
Monocytes Absolute: 0.6 10*3/uL (ref 0.1–1.0)
Monocytes Relative: 10 %
Neutro Abs: 3.3 10*3/uL (ref 1.7–7.7)
Neutrophils Relative %: 50 %
Platelets: 185 10*3/uL (ref 150–400)
RBC: 3.94 MIL/uL (ref 3.87–5.11)
RDW: 13.5 % (ref 11.5–15.5)
WBC: 6.3 10*3/uL (ref 4.0–10.5)
nRBC: 0 % (ref 0.0–0.2)

## 2019-11-17 LAB — COMPREHENSIVE METABOLIC PANEL
ALT: 16 U/L (ref 0–44)
AST: 27 U/L (ref 15–41)
Albumin: 4.1 g/dL (ref 3.5–5.0)
Alkaline Phosphatase: 105 U/L (ref 38–126)
Anion gap: 10 (ref 5–15)
BUN: 48 mg/dL — ABNORMAL HIGH (ref 8–23)
CO2: 27 mmol/L (ref 22–32)
Calcium: 9.5 mg/dL (ref 8.9–10.3)
Chloride: 105 mmol/L (ref 98–111)
Creatinine, Ser: 1.88 mg/dL — ABNORMAL HIGH (ref 0.44–1.00)
GFR calc Af Amer: 27 mL/min — ABNORMAL LOW (ref >60)
GFR calc non Af Amer: 23 mL/min — ABNORMAL LOW (ref >60)
Glucose, Bld: 139 mg/dL — ABNORMAL HIGH (ref 70–99)
Potassium: 4.6 mmol/L (ref 3.5–5.1)
Sodium: 142 mmol/L (ref 135–145)
Total Bilirubin: 0.7 mg/dL (ref 0.3–1.2)
Total Protein: 7.4 g/dL (ref 6.5–8.1)

## 2019-11-17 NOTE — Patient Instructions (Signed)
#   calcium + vit D 1000 twice a day

## 2019-11-17 NOTE — Assessment & Plan Note (Addendum)
#  Synchronous bilateral breast cancer-status post bilateral mastectomies-    # Right Breast- T2N3 [19/24 LN]; ER/PR positive HER-2 NEG   # Left breast cancer pT1cN1a; ER/PR Pos; Her 2 NEG.  #Proceed with adjuvant radiation as planned starting next week.  Discussed the role of radiation in decreasing the risk of local recurrence.  Patient not a good candidate for systemic therapy given renal function age/frailty.  Would recommend adjuvant Femara 5 to 10 years.  Reviewed the potential side effects including but not limited to hot flashes joint pains myalgias osteoporosis.  We will start after radiation.  # CKD- Stage- IV [Dr.Kolluru]; STABLE.   # DISPOSITION: # follow up in 1 month- MD; labs; cbc/cmp/ca 27-29;ca 15-3;cea-Dr.B   Cc;Dr.Baboff

## 2019-11-17 NOTE — Progress Notes (Signed)
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 Burnett] stage III T2N3 [19/24 LN]; ER/PR positive HER-2 negative.   # Left breast-invasive mammary carcinoma-status post mastectomy pT1cN1a; stage I-ER/PR positive HER-2 negative  #November 23, 2019-start adjuvant radiation bilateral; no chemo; plan adjuvant endocrine therapy ----------------------------------------------------------------------------------------   A. RIGHT BREAST, 3:00; ULTRASOUND-GUIDED BIOPSY:  - INVASIVE MAMMARY CARCINOMA WITH MICROPAPILLARY FEATURES; Size of invasive carcinoma: 57m in this sample  Histologic grade of invasive carcinoma: Grade 2 ; ER/PR > 905: her 2- NEG  A. RIGHT BREAST, 3:00; ULTRASOUND-GUIDED BIOPSY: - INVASIVE MAMMARY CARCINOMA WITH MICROPAPILLARY FEATURES. 180min this sample. Grade 2. Lymphovascular invasion: Present.   B. LYMPH NODE, RIGHT AXILLARY; ULTRASOUND-GUIDED NEEDLE CORE BIOPSY: - METASTATIC MAMMARY CARCINOMA, 6 MM IN THIS SAMPLE.   C. RIGHT BREAST, LATERAL CALCIFICATIONS; STEREOTACTIC BIOPSY: - DUCTAL CARCINOMA IN SITU, INTERMEDIATE GRADE, WITH CALCIFICATIONS. -------------------------------------------------------------   # CKD- Stage III-IV [Dr.Kolluru]  # SURVIVORSHIP:   # GENETICS:   DIAGNOSIS:  Right breast cancer stage III;  left breast cancer-stage I GOALS: Cure  CURRENT/MOST RECENT THERAPY : Radiation/Femara    Carcinoma of upper-inner quadrant of right breast in [place], estrogen receptor positive [place] [place] Initial Diagnosis   Carcinoma of upper-inner quadrant of right breast in [place], [place] positive [place]     HISTORY  OF PRESENTING ILLNESS:  Tina STEHLE78.o.  female new diagnosis of bilateral breast cancer is here for follow-up post surgery.  In the interim patient underwent evaluation with radiation oncology.  Awaiting to start radiation next week.  Denies any unusual aches and pains but denies any nausea vomiting or abdominal pain.  Complains of ongoing fatigue.   Review of Systems  Constitutional: Positive for malaise/fatigue. Negative for chills, diaphoresis, fever and weight loss.  HENT: Negative for nosebleeds and sore throat.   Eyes: Negative for double vision.  Respiratory: Negative for cough, hemoptysis, sputum production, shortness of breath and wheezing.   Cardiovascular: Negative for chest pain, palpitations, orthopnea and leg swelling.  Gastrointestinal: Negative for abdominal pain, blood in stool, constipation, diarrhea, heartburn, melena, nausea and vomiting.  Genitourinary: Negative for dysuria, frequency and urgency.  Musculoskeletal: Positive for back pain. Negative for joint pain.  Skin: Negative.  Negative for itching and rash.  Neurological: Negative for dizziness, tingling, focal weakness, weakness and headaches.  Endo/Heme/Allergies: Does not bruise/bleed easily.  Psychiatric/Behavioral: Negative for depression. The patient is not nervous/anxious and does not have insomnia.      MEDICAL HISTORY:  Past Medical History:  Diagnosis Date  . Arthritis    Gout  . Chronic kidney disease   . Diarrhea   . Hypercholesteremia   . Hypertension   . Neuropathy    feet and lower legs  . Seasonal allergies   . Skin cancer    face    SURGICAL HISTORY: Past Surgical History:  Procedure Laterality Date  . ABDOMINAL HYSTERECTOMY  2000  . APPENDECTOMY    . BREAST BIOPSY Right 09/19/2019   affirm bx of calcs UOQ, x marker, path pending  . BREAST BIOPSY Right 09/19/2019   usKoreax of mass,heart marker, path pending  . BREAST BIOPSY Right 09/19/2019   usKoreax of LN, coil marker,  path pending  .  CATARACT EXTRACTION Left   . CATARACT EXTRACTION W/PHACO Right 10/24/2014   Procedure: CATARACT EXTRACTION PHACO AND INTRAOCULAR LENS PLACEMENT (IOC);  Surgeon: Leandrew Koyanagi, MD;  Location: [facility];  Service: Ophthalmology;  Laterality: Right;  . ESOPHAGOGASTRODUODENOSCOPY N/A 03/07/2018   Procedure: ESOPHAGOGASTRODUODENOSCOPY (EGD);  Surgeon: Lin Landsman, MD;  Location[facility] ENDOSCOPY;  Service: Gastroenterology;  Laterality: N/A;  . MASTECTOMY MODIFIED RADICAL Right 10/13/2019   Procedure: MASTECTOMY MODIFIED RADICAL;  Surgeon: Robert Bellow, MD;  Location: [facility] ORS;  Service: General;  Laterality: Right;  . RENAL ANGIOGRAPHY Right 04/11/2018   Procedure: RENAL ANGIOGRAPHY;  Surgeon: Algernon Huxley, MD;  Location: [place] [facility];  Service: Cardiovascular;  Laterality: Right;  . SIMPLE MASTECTOMY WITH AXILLARY SENTINEL NODE BIOPSY Left 10/13/2019   Procedure: SIMPLE MASTECTOMY TRUE CUT BIOPSY, SENTINEL NODE BIOPSY;  Surgeon: Robert Bellow, MD;  Location: [facility] ORS;  Service: General;  Laterality: Left;    SOCIAL HISTORY: Social History   Socioeconomic History  . Marital status: Widowed    Spouse name: Not on file  . Number of children: Not on file  . Years of education: Not on file  . Highest education level: Not on file  Occupational History  . Not on file  Tobacco Use  . Smoking status: Never Smoker  . Smokeless tobacco: Never Used  Substance and Sexual Activity  . Alcohol use: No  . Drug use: Never  . Sexual activity: Not on file  Other Topics Concern  . 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    Social Determinants of Health   Financial Resource Strain:   . Difficulty of Paying Living Expenses:   Food Insecurity:   . Worried About Charity fundraiser in the Last Year:   . Arboriculturist in the Last Year:    Transportation Needs:   . Film/video editor (Medical):   Tina Curtis Lack of Transportation (Non-Medical):   Physical Activity:   . Days of Exercise per Week:   . Minutes of Exercise per Session:   Stress:   . Feeling of Stress :   Social Connections:   . Frequency of Communication with Friends and Family:   . Frequency of Social Gatherings with Friends and Family:   . Attends Religious Services:   . Active Member of Clubs or Organizations:   . Attends Archivist Meetings:   Tina Curtis Marital Status:   Intimate Partner Violence:   . Fear of Current or Ex-Partner:   . Emotionally Abused:   Tina Curtis Physically Abused:   . Sexually Abused:     FAMILY HISTORY: Family History  Problem Relation Age of Onset  . Hypertension Mother   . Hypertension Father   . Stroke Father   . Breast cancer Daughter 79    ALLERGIES:  is allergic to penicillins and drug ingredient [zinc].  MEDICATIONS:  Current Outpatient Medications  Medication Sig Dispense Refill  . allopurinol (ZYLOPRIM) 300 MG tablet Take 300 mg by mouth daily.     Tina Curtis amLODipine (NORVASC) 5 MG tablet Take 2.5 mg by mouth daily.   1  . aspirin EC 81 MG tablet Take 81 mg by mouth daily.    . Baclofen 5 MG TABS Take 2.5 mg by mouth in the morning and at bedtime.     . citalopram (CELEXA) 10 MG tablet Take 10 mg by mouth daily.     Tina Curtis  clopidogrel (PLAVIX) 75 MG tablet TAKE 1 TABLET BY MOUTH EVERY DAY 30 tablet 1  . docusate sodium (COLACE) 100 MG capsule Take 100 mg by mouth daily as needed for mild constipation.    . DULoxetine (CYMBALTA) 60 MG capsule Take 60 mg by mouth daily.     . furosemide (LASIX) 20 MG tablet Take 20 mg by mouth daily.     Tina Curtis gabapentin (NEURONTIN) 100 MG capsule Take 100 mg by mouth at bedtime.     . hydrALAZINE (APRESOLINE) 50 MG tablet Take 1 tablet (50 mg total) by mouth 2 (two) times daily. 30 tablet 0  . lidocaine-prilocaine (EMLA) cream Apply 1 application topically as needed (to breast).    Tina Curtis loratadine  (CLARITIN) 10 MG tablet Take 10 mg by mouth daily as needed for allergies. AM    . metoprolol succinate (TOPROL-XL) 25 MG 24 hr tablet Take 37.5 mg by mouth at bedtime.     . Multiple Vitamins-Minerals (ICAPS PO) Take 1 capsule by mouth in the morning and at bedtime.     . polyethylene glycol (MIRALAX / GLYCOLAX) packet Take 17 g by mouth daily as needed. 60 each 0  . simvastatin (ZOCOR) 20 MG tablet Take 20 mg by mouth every evening. PM     . Cholecalciferol (VITAMIN D PO) Take 1 tablet by mouth daily as needed (winter months).  (Patient not taking: Reported on 11/17/2019)    . HYDROcodone-acetaminophen (NORCO/VICODIN) 5-325 MG tablet Take 0.5-1 tablets by mouth every 4 (four) hours as needed for moderate pain. (Patient not taking: Reported on 10/26/2019) 12 tablet 0   No current facility-administered medications for this visit.      Tina Curtis  PHYSICAL EXAMINATION: ECOG PERFORMANCE STATUS: 0 - Asymptomatic  Vitals:   11/17/19 1031  BP: 138/76  Pulse: 64  Resp: 16  Temp: 97.9 F (36.6 C)  SpO2: 97%   Filed Weights   11/17/19 1031  Weight: 142 lb 3.2 oz (64.5 kg)    Physical Exam Constitutional:      Comments: Walks with a cane.with daughter.   HENT:     Head: Normocephalic and atraumatic.     Mouth/Throat:     Pharynx: No oropharyngeal exudate.  Eyes:     Pupils: Pupils are equal, round, and reactive to light.  Cardiovascular:     Rate and Rhythm: Normal rate and regular rhythm.  Pulmonary:     Effort: Pulmonary effort is normal. No respiratory distress.     Breath sounds: Normal breath sounds. No wheezing.  Abdominal:     General: Bowel sounds are normal. There is no distension.     Palpations: Abdomen is soft. There is no mass.     Tenderness: There is no abdominal tenderness. There is no guarding or rebound.  Musculoskeletal:        General: No tenderness. Normal range of motion.     Cervical back: Normal range of motion and neck supple.  Skin:    General: Skin is warm.      Comments:    Neurological:     Mental Status: She is alert and oriented to person, place, and time.  Psychiatric:        Mood and Affect: Affect normal.    LABORATORY DATA:  I have reviewed the data as listed Lab Results  Component Value Date   WBC 6.3 11/17/2019   HGB 12.7 11/17/2019   HCT 36.9 11/17/2019   MCV 93.7 11/17/2019   PLT 185 11/17/2019  Recent Labs    09/27/19 1240 11/17/19 1014  NA 141 142  K 4.8 4.6  CL 104 105  CO2 27 27  GLUCOSE 96 139*  BUN 51* 48*  CREATININE 1.75* 1.88*  CALCIUM 9.7 9.5  GFRNONAA 25* 23*  GFRAA 29* 27*  PROT 8.2* 7.4  ALBUMIN 4.6 4.1  AST 27 27  ALT 19 16  ALKPHOS 114 105  BILITOT 0.6 0.7    RADIOGRAPHIC STUDIES: I have personally reviewed the radiological images as listed and agreed with the findings in the report. No results found.  ASSESSMENT & PLAN:   Carcinoma of upper-inner quadrant of right breast in female, estrogen receptor positive ([place]) #Synchronous bilateral breast cancer-status post bilateral mastectomies-    # Right Breast- T2N3 [19/24 LN]; ER/PR positive HER-2 NEG   # Left breast cancer pT1cN1a; ER/PR Pos; Her 2 NEG.  #Proceed with adjuvant radiation as planned starting next week.  Discussed the role of radiation in decreasing the risk of local recurrence.  Patient not a good candidate for systemic therapy given renal function age/frailty.  Would recommend adjuvant Femara 5 to 10 years.  Reviewed the potential side effects including but not limited to hot flashes joint pains myalgias osteoporosis.  We will start after radiation.  # CKD- Stage- IV [Dr.Kolluru]; STABLE.   # DISPOSITION: # follow up in 1 month- MD; labs; cbc/cmp/ca 27-29;ca 15-3;cea-Dr.B   Cc;Dr.Baboff   All questions were answered. The patient/family knows to call the clinic with any problems, questions or concerns.     Cammie Sickle, MD 11/17/2019 12:48 PM

## 2019-11-18 LAB — CEA: CEA: 4.7 ng/mL (ref 0.0–4.7)

## 2019-11-18 LAB — CANCER ANTIGEN 15-3: CA 15-3: 67.1 U/mL — ABNORMAL HIGH (ref 0.0–25.0)

## 2019-11-18 LAB — CANCER ANTIGEN 27.29: CA 27.29: 87.6 U/mL — ABNORMAL HIGH (ref 0.0–38.6)

## 2019-11-20 ENCOUNTER — Telehealth: Payer: Self-pay | Admitting: Internal Medicine

## 2019-11-20 DIAGNOSIS — C50912 Malignant neoplasm of unspecified site of left female breast: Secondary | ICD-10-CM | POA: Diagnosis present

## 2019-11-20 DIAGNOSIS — C50911 Malignant neoplasm of unspecified site of right female breast: Secondary | ICD-10-CM | POA: Diagnosis not present

## 2019-11-20 DIAGNOSIS — Z170 Estrogen receptor positive status [ER+]: Secondary | ICD-10-CM | POA: Diagnosis not present

## 2019-11-20 NOTE — Telephone Encounter (Signed)
On 7/12- LVM for pt's daughter [name] with update on pt's clinical status.  GB

## 2019-11-22 ENCOUNTER — Ambulatory Visit: Admission: RE | Admit: 2019-11-22 | Payer: Medicare PPO | Source: Ambulatory Visit

## 2019-11-22 DIAGNOSIS — C50911 Malignant neoplasm of unspecified site of right female breast: Secondary | ICD-10-CM | POA: Diagnosis not present

## 2019-11-23 ENCOUNTER — Ambulatory Visit
Admission: RE | Admit: 2019-11-23 | Discharge: 2019-11-23 | Disposition: A | Discharge status: Home / Self Care | Payer: Medicare PPO | Source: Ambulatory Visit | Attending: Radiation Oncology | Admitting: Radiation Oncology

## 2019-11-23 DIAGNOSIS — C50911 Malignant neoplasm of unspecified site of right female breast: Secondary | ICD-10-CM | POA: Diagnosis not present

## 2019-11-24 ENCOUNTER — Ambulatory Visit
Admission: RE | Admit: 2019-11-24 | Discharge: 2019-11-24 | Disposition: A | Discharge status: Home / Self Care | Payer: Medicare PPO | Source: Ambulatory Visit | Attending: Radiation Oncology | Admitting: Radiation Oncology

## 2019-11-24 DIAGNOSIS — C50911 Malignant neoplasm of unspecified site of right female breast: Secondary | ICD-10-CM | POA: Diagnosis not present

## 2019-11-27 ENCOUNTER — Ambulatory Visit
Admission: RE | Admit: 2019-11-27 | Discharge: 2019-11-27 | Disposition: A | Discharge status: Home / Self Care | Payer: Medicare PPO | Source: Ambulatory Visit | Attending: Radiation Oncology | Admitting: Radiation Oncology

## 2019-11-27 DIAGNOSIS — C50911 Malignant neoplasm of unspecified site of right female breast: Secondary | ICD-10-CM | POA: Diagnosis not present

## 2019-11-28 ENCOUNTER — Ambulatory Visit
Admission: RE | Admit: 2019-11-28 | Discharge: 2019-11-28 | Disposition: A | Discharge status: Home / Self Care | Payer: Medicare PPO | Source: Ambulatory Visit | Attending: Radiation Oncology | Admitting: Radiation Oncology

## 2019-11-28 DIAGNOSIS — C50911 Malignant neoplasm of unspecified site of right female breast: Secondary | ICD-10-CM | POA: Diagnosis not present

## 2019-11-29 ENCOUNTER — Ambulatory Visit
Admission: RE | Admit: 2019-11-29 | Discharge: 2019-11-29 | Disposition: A | Discharge status: Home / Self Care | Payer: Medicare PPO | Source: Ambulatory Visit | Attending: Radiation Oncology | Admitting: Radiation Oncology

## 2019-11-29 DIAGNOSIS — C50911 Malignant neoplasm of unspecified site of right female breast: Secondary | ICD-10-CM | POA: Diagnosis not present

## 2019-11-30 ENCOUNTER — Ambulatory Visit
Admission: RE | Admit: 2019-11-30 | Discharge: 2019-11-30 | Disposition: A | Discharge status: Home / Self Care | Payer: Medicare PPO | Source: Ambulatory Visit | Attending: Radiation Oncology | Admitting: Radiation Oncology

## 2019-11-30 DIAGNOSIS — C50911 Malignant neoplasm of unspecified site of right female breast: Secondary | ICD-10-CM | POA: Diagnosis not present

## 2019-12-01 ENCOUNTER — Ambulatory Visit
Admission: RE | Admit: 2019-12-01 | Discharge: 2019-12-01 | Disposition: A | Discharge status: Home / Self Care | Payer: Medicare PPO | Source: Ambulatory Visit | Attending: Radiation Oncology | Admitting: Radiation Oncology

## 2019-12-01 DIAGNOSIS — C50911 Malignant neoplasm of unspecified site of right female breast: Secondary | ICD-10-CM | POA: Diagnosis not present

## 2019-12-04 ENCOUNTER — Ambulatory Visit
Admission: RE | Admit: 2019-12-04 | Discharge: 2019-12-04 | Disposition: A | Discharge status: Home / Self Care | Payer: Medicare PPO | Source: Ambulatory Visit | Attending: Radiation Oncology | Admitting: Radiation Oncology

## 2019-12-04 DIAGNOSIS — C50911 Malignant neoplasm of unspecified site of right female breast: Secondary | ICD-10-CM | POA: Diagnosis not present

## 2019-12-05 ENCOUNTER — Ambulatory Visit
Admission: RE | Admit: 2019-12-05 | Discharge: 2019-12-05 | Disposition: A | Discharge status: Home / Self Care | Payer: Medicare PPO | Source: Ambulatory Visit | Attending: Radiation Oncology | Admitting: Radiation Oncology

## 2019-12-05 DIAGNOSIS — C50911 Malignant neoplasm of unspecified site of right female breast: Secondary | ICD-10-CM | POA: Diagnosis not present

## 2019-12-06 ENCOUNTER — Ambulatory Visit
Admission: RE | Admit: 2019-12-06 | Discharge: 2019-12-06 | Disposition: A | Discharge status: Home / Self Care | Payer: Medicare PPO | Source: Ambulatory Visit | Attending: Radiation Oncology | Admitting: Radiation Oncology

## 2019-12-06 ENCOUNTER — Inpatient Hospital Stay: Payer: Medicare PPO

## 2019-12-06 DIAGNOSIS — C50911 Malignant neoplasm of unspecified site of right female breast: Secondary | ICD-10-CM | POA: Diagnosis not present

## 2019-12-07 ENCOUNTER — Ambulatory Visit
Admission: RE | Admit: 2019-12-07 | Discharge: 2019-12-07 | Disposition: A | Discharge status: Home / Self Care | Payer: Medicare PPO | Source: Ambulatory Visit | Attending: Radiation Oncology | Admitting: Radiation Oncology

## 2019-12-07 DIAGNOSIS — C50911 Malignant neoplasm of unspecified site of right female breast: Secondary | ICD-10-CM | POA: Diagnosis not present

## 2019-12-08 ENCOUNTER — Ambulatory Visit
Admission: RE | Admit: 2019-12-08 | Discharge: 2019-12-08 | Disposition: A | Discharge status: Home / Self Care | Payer: Medicare PPO | Source: Ambulatory Visit | Attending: Radiation Oncology | Admitting: Radiation Oncology

## 2019-12-08 DIAGNOSIS — C50911 Malignant neoplasm of unspecified site of right female breast: Secondary | ICD-10-CM | POA: Diagnosis not present

## 2019-12-11 ENCOUNTER — Ambulatory Visit: Payer: Medicare PPO

## 2019-12-12 ENCOUNTER — Ambulatory Visit: Payer: Medicare PPO

## 2019-12-13 ENCOUNTER — Ambulatory Visit: Payer: Medicare PPO

## 2019-12-14 ENCOUNTER — Ambulatory Visit: Payer: Medicare PPO

## 2019-12-15 ENCOUNTER — Ambulatory Visit: Payer: Medicare PPO | Admitting: Internal Medicine

## 2019-12-15 ENCOUNTER — Ambulatory Visit: Payer: Medicare PPO

## 2019-12-15 ENCOUNTER — Other Ambulatory Visit: Payer: Medicare PPO

## 2019-12-18 ENCOUNTER — Ambulatory Visit
Admission: RE | Admit: 2019-12-18 | Discharge: 2019-12-18 | Disposition: A | Discharge status: Home / Self Care | Payer: Medicare PPO | Source: Ambulatory Visit | Attending: Radiation Oncology | Admitting: Radiation Oncology

## 2019-12-18 DIAGNOSIS — C50912 Malignant neoplasm of unspecified site of left female breast: Secondary | ICD-10-CM | POA: Diagnosis present

## 2019-12-18 DIAGNOSIS — C50911 Malignant neoplasm of unspecified site of right female breast: Secondary | ICD-10-CM | POA: Diagnosis not present

## 2019-12-18 DIAGNOSIS — Z170 Estrogen receptor positive status [ER+]: Secondary | ICD-10-CM | POA: Insufficient documentation

## 2019-12-19 ENCOUNTER — Ambulatory Visit
Admission: RE | Admit: 2019-12-19 | Discharge: 2019-12-19 | Disposition: A | Discharge status: Home / Self Care | Payer: Medicare PPO | Source: Ambulatory Visit | Attending: Radiation Oncology | Admitting: Radiation Oncology

## 2019-12-19 DIAGNOSIS — C50911 Malignant neoplasm of unspecified site of right female breast: Secondary | ICD-10-CM | POA: Diagnosis not present

## 2019-12-20 ENCOUNTER — Inpatient Hospital Stay: Payer: Medicare PPO | Attending: Internal Medicine

## 2019-12-20 ENCOUNTER — Other Ambulatory Visit: Payer: Self-pay

## 2019-12-20 ENCOUNTER — Encounter: Payer: Self-pay | Admitting: Internal Medicine

## 2019-12-20 ENCOUNTER — Ambulatory Visit
Admission: RE | Admit: 2019-12-20 | Discharge: 2019-12-20 | Disposition: A | Discharge status: Home / Self Care | Payer: Medicare PPO | Source: Ambulatory Visit | Attending: Radiation Oncology | Admitting: Radiation Oncology

## 2019-12-20 ENCOUNTER — Inpatient Hospital Stay (HOSPITAL_BASED_OUTPATIENT_CLINIC_OR_DEPARTMENT_OTHER): Payer: Medicare PPO | Admitting: Internal Medicine

## 2019-12-20 ENCOUNTER — Telehealth: Payer: Self-pay | Admitting: Internal Medicine

## 2019-12-20 ENCOUNTER — Other Ambulatory Visit: Payer: Self-pay | Admitting: *Deleted

## 2019-12-20 DIAGNOSIS — Z923 Personal history of irradiation: Secondary | ICD-10-CM | POA: Diagnosis not present

## 2019-12-20 DIAGNOSIS — C50211 Malignant neoplasm of upper-inner quadrant of right female breast: Secondary | ICD-10-CM | POA: Insufficient documentation

## 2019-12-20 DIAGNOSIS — I10 Essential (primary) hypertension: Secondary | ICD-10-CM | POA: Diagnosis not present

## 2019-12-20 DIAGNOSIS — Z9013 Acquired absence of bilateral breasts and nipples: Secondary | ICD-10-CM | POA: Diagnosis not present

## 2019-12-20 DIAGNOSIS — C50911 Malignant neoplasm of unspecified site of right female breast: Secondary | ICD-10-CM | POA: Diagnosis not present

## 2019-12-20 DIAGNOSIS — Z7982 Long term (current) use of aspirin: Secondary | ICD-10-CM | POA: Diagnosis not present

## 2019-12-20 DIAGNOSIS — E7800 Pure hypercholesterolemia, unspecified: Secondary | ICD-10-CM | POA: Insufficient documentation

## 2019-12-20 DIAGNOSIS — Z170 Estrogen receptor positive status [ER+]: Secondary | ICD-10-CM | POA: Insufficient documentation

## 2019-12-20 DIAGNOSIS — Z79899 Other long term (current) drug therapy: Secondary | ICD-10-CM | POA: Insufficient documentation

## 2019-12-20 DIAGNOSIS — N184 Chronic kidney disease, stage 4 (severe): Secondary | ICD-10-CM | POA: Diagnosis not present

## 2019-12-20 DIAGNOSIS — Z79811 Long term (current) use of aromatase inhibitors: Secondary | ICD-10-CM | POA: Insufficient documentation

## 2019-12-20 LAB — CBC WITH DIFFERENTIAL/PLATELET
Abs Immature Granulocytes: 0.03 10*3/uL (ref 0.00–0.07)
Basophils Absolute: 0.1 10*3/uL (ref 0.0–0.1)
Basophils Relative: 1 %
Eosinophils Absolute: 0.4 10*3/uL (ref 0.0–0.5)
Eosinophils Relative: 7 %
HCT: 36.8 % (ref 36.0–46.0)
Hemoglobin: 12.5 g/dL (ref 12.0–15.0)
Immature Granulocytes: 1 %
Lymphocytes Relative: 20 %
Lymphs Abs: 1.2 10*3/uL (ref 0.7–4.0)
MCH: 31.6 pg (ref 26.0–34.0)
MCHC: 34 g/dL (ref 30.0–36.0)
MCV: 92.9 fL (ref 80.0–100.0)
Monocytes Absolute: 0.7 10*3/uL (ref 0.1–1.0)
Monocytes Relative: 12 %
Neutro Abs: 3.3 10*3/uL (ref 1.7–7.7)
Neutrophils Relative %: 59 %
Platelets: 162 10*3/uL (ref 150–400)
RBC: 3.96 MIL/uL (ref 3.87–5.11)
RDW: 13.3 % (ref 11.5–15.5)
WBC: 5.7 10*3/uL (ref 4.0–10.5)
nRBC: 0 % (ref 0.0–0.2)

## 2019-12-20 LAB — COMPREHENSIVE METABOLIC PANEL
ALT: 15 U/L (ref 0–44)
AST: 25 U/L (ref 15–41)
Albumin: 4.3 g/dL (ref 3.5–5.0)
Alkaline Phosphatase: 110 U/L (ref 38–126)
Anion gap: 10 (ref 5–15)
BUN: 38 mg/dL — ABNORMAL HIGH (ref 8–23)
CO2: 25 mmol/L (ref 22–32)
Calcium: 9.4 mg/dL (ref 8.9–10.3)
Chloride: 105 mmol/L (ref 98–111)
Creatinine, Ser: 1.72 mg/dL — ABNORMAL HIGH (ref 0.44–1.00)
GFR calc Af Amer: 30 mL/min — ABNORMAL LOW (ref >60)
GFR calc non Af Amer: 26 mL/min — ABNORMAL LOW (ref >60)
Glucose, Bld: 123 mg/dL — ABNORMAL HIGH (ref 70–99)
Potassium: 4.9 mmol/L (ref 3.5–5.1)
Sodium: 140 mmol/L (ref 135–145)
Total Bilirubin: 0.6 mg/dL (ref 0.3–1.2)
Total Protein: 7.4 g/dL (ref 6.5–8.1)

## 2019-12-20 NOTE — Assessment & Plan Note (Addendum)
#  Synchronous bilateral breast cancer-status post bilateral mastectomies-   Right Breast-stage III ER/PR positive HER-2 NEG;  & Left breast cancer stage II ER/PR Pos; Her 2 NEG.  # Currently on adjuvant Radiation [finishing 08/31]-tolerating with moderate side effecs- see below.  We will plan to start Femara in approximately 1 month.  Prescription visit.  # Fatigue-G-2-3-likely from radiation.  Discussed supportive care-frequent rest.   #Gait instability-suggestive from fatigue rather than any CNS disease.  Monitor closely.  If worse would recommend MRI of the brain.   # CKD- Stage- IV [Dr.Kolluru]; STABLE.    # Right shoulder pain- ? Arthritis- tylenol prn.   # DISPOSITION: # follow up in 1 month- MD; labs; cbc/cmp/ca 27-29;ca 15-3;cea-Dr.B   Cc;Dr.Baboff

## 2019-12-20 NOTE — Progress Notes (Signed)
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 Burnett] stage III T2N3 [19/24 LN]; ER/PR positive HER-2 negative.   # Left breast-invasive mammary carcinoma-status post mastectomy [id number]; stage I-ER/PR positive HER-2 negative  #November 23, 2019-start adjuvant radiation bilateral; no chemo; plan adjuvant endocrine therapy ----------------------------------------------------------------------------------------   A. RIGHT BREAST, 3:00; ULTRASOUND-GUIDED BIOPSY:  - INVASIVE MAMMARY CARCINOMA WITH MICROPAPILLARY FEATURES; Size of invasive carcinoma: 23m in this sample  Histologic grade of invasive carcinoma: Grade 2 ; ER/PR > 905: her 2- NEG  A. RIGHT BREAST, 3:00; ULTRASOUND-GUIDED BIOPSY: - INVASIVE MAMMARY CARCINOMA WITH MICROPAPILLARY FEATURES. 174min this sample. Grade 2. Lymphovascular invasion: Present.   B. LYMPH NODE, RIGHT AXILLARY; ULTRASOUND-GUIDED NEEDLE CORE BIOPSY: - METASTATIC MAMMARY CARCINOMA, 6 MM IN THIS SAMPLE.   C. RIGHT BREAST, LATERAL CALCIFICATIONS; STEREOTACTIC BIOPSY: - DUCTAL CARCINOMA IN SITU, INTERMEDIATE GRADE, WITH CALCIFICATIONS. -------------------------------------------------------------   # CKD- Stage III-IV [Dr.Kolluru]  # SURVIVORSHIP:   # GENETICS:   DIAGNOSIS:  Right breast cancer stage III;  left breast cancer-stage I GOALS: Cure  CURRENT/MOST RECENT THERAPY : Radiation/Femara    Carcinoma of upper-inner quadrant of right breast in [place], [facility] [place] Initial Diagnosis   Carcinoma of upper-inner quadrant of right breast in female, estrogen receptor positive [place]     HISTORY  OF PRESENTING ILLNESS:  Tina CATER89.[id number].  female new diagnosis of bilateral breast cancer currently undergoing adjuvant radiation is here for follow-up.  Patient admits to fatigue; and mild gait instability attributed to generalized weakness.  She is walking with a cane/walker.  No falls.  Noted to have mild warmth the site of radiation otherwise no redness or desquamation.  Patient complains of mild right shoulder pain especially with movement.   Review of Systems  Constitutional: Positive for malaise/fatigue. Negative for chills, diaphoresis, fever and weight loss.  HENT: Negative for nosebleeds and sore throat.   Eyes: Negative for double vision.  Respiratory: Negative for cough, hemoptysis, sputum production, shortness of breath and wheezing.   Cardiovascular: Negative for chest pain, palpitations, orthopnea and leg swelling.  Gastrointestinal: Negative for abdominal pain, blood in stool, constipation, diarrhea, heartburn, melena, nausea and vomiting.  Genitourinary: Negative for dysuria, frequency and urgency.  Musculoskeletal: Positive for back pain and joint pain.  Skin: Negative.  Negative for itching and rash.  Neurological: Negative for dizziness, tingling, focal weakness, weakness and headaches.  Endo/Heme/Allergies: Does not bruise/bleed easily.  Psychiatric/Behavioral: Negative for depression. The patient is not nervous/anxious and does not have insomnia.      MEDICAL HISTORY:  Past Medical History:  Diagnosis Date  . Arthritis    Gout  . Chronic kidney disease   . Diarrhea   . Hypercholesteremia   . Hypertension   . Neuropathy    feet and lower legs  . Seasonal allergies   . Skin cancer    face    SURGICAL HISTORY: Past Surgical History:  Procedure Laterality Date  . ABDOMINAL HYSTERECTOMY  2000  . APPENDECTOMY    . BREAST BIOPSY Right 09/19/2019   affirm bx of calcs UOQ, x marker, path pending  . BREAST BIOPSY Right 09/19/2019   [place] of mass,heart  marker, path pending  . BREAST  BIOPSY Right 09/19/2019   [place] bx of LN, coil marker, path pending  . CATARACT EXTRACTION Left   . CATARACT EXTRACTION W/PHACO Right 10/24/2014   Procedure: CATARACT EXTRACTION PHACO AND INTRAOCULAR LENS PLACEMENT (IOC);  Surgeon: Leandrew Koyanagi, MD;  Location: [facility];  Service: Ophthalmology;  Laterality: Right;  . ESOPHAGOGASTRODUODENOSCOPY N/A 03/07/2018   Procedure: ESOPHAGOGASTRODUODENOSCOPY (EGD);  Surgeon: Lin Landsman, MD;  Location[facility] ENDOSCOPY;  Service: Gastroenterology;  Laterality: N/A;  . MASTECTOMY MODIFIED RADICAL Right 10/13/2019   Procedure: MASTECTOMY MODIFIED RADICAL;  Surgeon: Robert Bellow, MD;  Location: [facility] ORS;  Service: General;  Laterality: Right;  . RENAL ANGIOGRAPHY Right 04/11/2018   Procedure: RENAL ANGIOGRAPHY;  Surgeon: Algernon Huxley, MD;  Location: [facility];  Service: Cardiovascular;  Laterality: Right;  . SIMPLE MASTECTOMY WITH AXILLARY SENTINEL NODE BIOPSY Left 10/13/2019   Procedure: SIMPLE MASTECTOMY TRUE CUT BIOPSY, SENTINEL NODE BIOPSY;  Surgeon: Robert Bellow, MD;  Location: [facility] ORS;  Service: General;  Laterality: Left;    SOCIAL HISTORY: Social History   Socioeconomic History  . Marital status: Widowed    Spouse name: Not on file  . Number of children: Not on file  . Years of education: Not on file  . Highest education level: Not on file  Occupational History  . Not on file  Tobacco Use  . Smoking status: Never Smoker  . Smokeless tobacco: Never Used  Substance and Sexual Activity  . Alcohol use: No  . Drug use: Never  . Sexual activity: Not on file  Other Topics Concern  . 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    Social Determinants of Health   Financial Resource Strain:   . Difficulty of Paying Living Expenses:   Food Insecurity:   . Worried About  Charity fundraiser in the Last Year:   . Arboriculturist in the Last Year:   Transportation Needs:   . Film/video editor (Medical):   Marland Kitchen Lack of Transportation (Non-Medical):   Physical Activity:   . Days of Exercise per Week:   . Minutes of Exercise per Session:   Stress:   . Feeling of Stress :   Social Connections:   . Frequency of Communication with Friends and Family:   . Frequency of Social Gatherings with Friends and Family:   . Attends Religious Services:   . Active Member of Clubs or Organizations:   . Attends Archivist Meetings:   Marland Kitchen Marital Status:   Intimate Partner Violence:   . Fear of Current or Ex-Partner:   . Emotionally Abused:   Marland Kitchen Physically Abused:   . Sexually Abused:     FAMILY HISTORY: Family History  Problem Relation Age of Onset  . Hypertension Mother   . Hypertension Father   . Stroke Father   . Breast cancer Daughter 19    ALLERGIES:  is allergic to penicillins and drug ingredient [zinc].  MEDICATIONS:  Current Outpatient Medications  Medication Sig Dispense Refill  . allopurinol (ZYLOPRIM) 300 MG tablet Take 300 mg by mouth daily.     Marland Kitchen amLODipine (NORVASC) 5 MG tablet Take 2.5 mg by mouth daily.   1  . aspirin EC 81 MG tablet Take 81 mg by mouth daily.    . Baclofen 5 MG TABS Take 2.5 mg by mouth in the morning and at bedtime.     Marland Kitchen  citalopram (CELEXA) 10 MG tablet Take 10 mg by mouth daily.     . clopidogrel (PLAVIX) 75 MG tablet TAKE 1 TABLET BY MOUTH EVERY DAY 30 tablet 1  . docusate sodium (COLACE) 100 MG capsule Take 100 mg by mouth daily as needed for mild constipation.    . DULoxetine (CYMBALTA) 60 MG capsule Take 60 mg by mouth daily.     . furosemide (LASIX) 20 MG tablet Take 20 mg by mouth daily.     Marland Kitchen gabapentin (NEURONTIN) 100 MG capsule Take 100 mg by mouth at bedtime.     . hydrALAZINE (APRESOLINE) 50 MG tablet Take 1 tablet (50 mg total) by mouth 2 (two) times daily. 30 tablet 0  . lidocaine-prilocaine (EMLA)  cream Apply 1 application topically as needed (to breast).    Marland Kitchen loratadine (CLARITIN) 10 MG tablet Take 10 mg by mouth daily as needed for allergies. AM    . metoprolol succinate (TOPROL-XL) 25 MG 24 hr tablet Take 37.5 mg by mouth at bedtime.     . Multiple Vitamins-Minerals (ICAPS PO) Take 1 capsule by mouth in the morning and at bedtime.     . polyethylene glycol (MIRALAX / GLYCOLAX) packet Take 17 g by mouth daily as needed. 60 each 0  . simvastatin (ZOCOR) 20 MG tablet Take 20 mg by mouth every evening. PM     . Cholecalciferol (VITAMIN D PO) Take 1 tablet by mouth daily as needed (winter months).  (Patient not taking: Reported on 11/17/2019)    . HYDROcodone-acetaminophen (NORCO/VICODIN) 5-325 MG tablet Take 0.5-1 tablets by mouth every 4 (four) hours as needed for moderate pain. (Patient not taking: Reported on 10/26/2019) 12 tablet 0   No current facility-administered medications for this visit.      Marland Kitchen  PHYSICAL EXAMINATION: ECOG PERFORMANCE STATUS: 0 - Asymptomatic  Vitals:   12/20/19 1035  BP: (!) 150/65  Pulse: (!) 57  Resp: 16  Temp: (!) 96.5 F (35.8 C)  SpO2: 98%   Filed Weights   12/20/19 1035  Weight: 144 lb 6.4 oz (65.5 kg)    Physical Exam Constitutional:      Comments: Walks with a cane.with daughter.   HENT:     Head: Normocephalic and atraumatic.     Mouth/Throat:     Pharynx: No oropharyngeal exudate.  Eyes:     Pupils: Pupils are equal, round, and reactive to light.  Cardiovascular:     Rate and Rhythm: Normal rate and regular rhythm.  Pulmonary:     Effort: Pulmonary effort is normal. No respiratory distress.     Breath sounds: Normal breath sounds. No wheezing.  Abdominal:     General: Bowel sounds are normal. There is no distension.     Palpations: Abdomen is soft. There is no mass.     Tenderness: There is no abdominal tenderness. There is no guarding or rebound.  Musculoskeletal:        General: No tenderness. Normal range of motion.      Cervical back: Normal range of motion and neck supple.  Skin:    General: Skin is warm.     Comments:    Neurological:     Mental Status: She is alert and oriented to person, place, and time.  Psychiatric:        Mood and Affect: Affect normal.    LABORATORY DATA:  I have reviewed the data as listed Lab Results  Component Value Date   WBC 5.7 12/20/2019  HGB 12.5 12/20/2019   HCT 36.8 12/20/2019   MCV 92.9 12/20/2019   PLT 162 12/20/2019   Recent Labs    09/27/19 1240 11/17/19 1014 12/20/19 0910  NA 141 142 140  K 4.8 4.6 4.9  CL 104 105 105  CO2 '27 27 25  ' GLUCOSE 96 139* 123*  BUN 51* 48* 38*  CREATININE 1.75* 1.88* 1.72*  CALCIUM 9.7 9.5 9.4  GFRNONAA 25* 23* 26*  GFRAA 29* 27* 30*  PROT 8.2* 7.4 7.4  ALBUMIN 4.6 4.1 4.3  AST '27 27 25  ' ALT '19 16 15  ' ALKPHOS 114 105 110  BILITOT 0.6 0.7 0.6    RADIOGRAPHIC STUDIES: I have personally reviewed the radiological images as listed and agreed with the findings in the report. No results found.  ASSESSMENT & PLAN:   Carcinoma of upper-inner quadrant of right breast in female, estrogen receptor positive ([place]) #Synchronous bilateral breast cancer-status post bilateral mastectomies-   Right Breast-stage III ER/PR positive HER-2 NEG;  & Left breast cancer stage II ER/PR Pos; Her 2 NEG.  # Currently on adjuvant Radiation [finishing 08/31]-tolerating with moderate side effecs- see below.  We will plan to start Femara in approximately 1 month.  Prescription visit.  # Fatigue-G-2-3-likely from radiation.  Discussed supportive care-frequent rest.   #Gait instability-suggestive from fatigue rather than any CNS disease.  Monitor closely.  If worse would recommend MRI of the brain.   # CKD- Stage- IV [Dr.Kolluru]; STABLE.    # Right shoulder pain- ? Arthritis- tylenol prn.   # DISPOSITION: # follow up in 1 month- MD; labs; cbc/cmp/ca 27-29;ca 15-3;cea-Dr.B   Cc;Dr.Baboff   All questions were answered. The  patient/family knows to call the clinic with any problems, questions or concerns.     Cammie Sickle, MD 12/20/2019 11:11 AM

## 2019-12-20 NOTE — Patient Instructions (Signed)
Recommend Ca+ vit D3 [twice a day]

## 2019-12-20 NOTE — Telephone Encounter (Signed)
Spoke to patient daughter [name] regarding her mother's plan of care.  Clinical course so far as expected-fatigue likely from radiation.  Otherwise tolerating well.  GB

## 2019-12-21 ENCOUNTER — Ambulatory Visit
Admission: RE | Admit: 2019-12-21 | Discharge: 2019-12-21 | Disposition: A | Discharge status: Home / Self Care | Payer: Medicare PPO | Source: Ambulatory Visit | Attending: Radiation Oncology | Admitting: Radiation Oncology

## 2019-12-21 DIAGNOSIS — C50911 Malignant neoplasm of unspecified site of right female breast: Secondary | ICD-10-CM | POA: Diagnosis not present

## 2019-12-21 LAB — CANCER ANTIGEN 27.29: CA 27.29: 82.6 U/mL — ABNORMAL HIGH (ref 0.0–38.6)

## 2019-12-21 LAB — CEA: CEA: 4.5 ng/mL (ref 0.0–4.7)

## 2019-12-21 LAB — CANCER ANTIGEN 15-3: CA 15-3: 57 U/mL — ABNORMAL HIGH (ref 0.0–25.0)

## 2019-12-21 NOTE — Progress Notes (Signed)
FYI- GB

## 2019-12-22 ENCOUNTER — Ambulatory Visit
Admission: RE | Admit: 2019-12-22 | Discharge: 2019-12-22 | Disposition: A | Discharge status: Home / Self Care | Payer: Medicare PPO | Source: Ambulatory Visit | Attending: Radiation Oncology | Admitting: Radiation Oncology

## 2019-12-22 DIAGNOSIS — C50911 Malignant neoplasm of unspecified site of right female breast: Secondary | ICD-10-CM | POA: Diagnosis not present

## 2019-12-25 ENCOUNTER — Ambulatory Visit
Admission: RE | Admit: 2019-12-25 | Discharge: 2019-12-25 | Disposition: A | Discharge status: Home / Self Care | Payer: Medicare PPO | Source: Ambulatory Visit | Attending: Radiation Oncology | Admitting: Radiation Oncology

## 2019-12-25 DIAGNOSIS — C50911 Malignant neoplasm of unspecified site of right female breast: Secondary | ICD-10-CM | POA: Diagnosis not present

## 2019-12-26 ENCOUNTER — Ambulatory Visit
Admission: RE | Admit: 2019-12-26 | Discharge: 2019-12-26 | Disposition: A | Discharge status: Home / Self Care | Payer: Medicare PPO | Source: Ambulatory Visit | Attending: Radiation Oncology | Admitting: Radiation Oncology

## 2019-12-26 DIAGNOSIS — C50911 Malignant neoplasm of unspecified site of right female breast: Secondary | ICD-10-CM | POA: Diagnosis not present

## 2019-12-27 ENCOUNTER — Ambulatory Visit
Admission: RE | Admit: 2019-12-27 | Discharge: 2019-12-27 | Disposition: A | Discharge status: Home / Self Care | Payer: Medicare PPO | Source: Ambulatory Visit | Attending: Radiation Oncology | Admitting: Radiation Oncology

## 2019-12-27 DIAGNOSIS — C50911 Malignant neoplasm of unspecified site of right female breast: Secondary | ICD-10-CM | POA: Diagnosis not present

## 2019-12-27 NOTE — Progress Notes (Signed)
Answered patient questions about Calcium/Vitamin D and COVID booster third dose.

## 2019-12-28 ENCOUNTER — Ambulatory Visit
Admission: RE | Admit: 2019-12-28 | Discharge: 2019-12-28 | Disposition: A | Discharge status: Home / Self Care | Payer: Medicare PPO | Source: Ambulatory Visit | Attending: Radiation Oncology | Admitting: Radiation Oncology

## 2019-12-28 DIAGNOSIS — C50911 Malignant neoplasm of unspecified site of right female breast: Secondary | ICD-10-CM | POA: Diagnosis not present

## 2019-12-29 ENCOUNTER — Ambulatory Visit
Admission: RE | Admit: 2019-12-29 | Discharge: 2019-12-29 | Disposition: A | Discharge status: Home / Self Care | Payer: Medicare PPO | Source: Ambulatory Visit | Attending: Radiation Oncology | Admitting: Radiation Oncology

## 2019-12-29 DIAGNOSIS — C50911 Malignant neoplasm of unspecified site of right female breast: Secondary | ICD-10-CM | POA: Diagnosis not present

## 2020-01-01 ENCOUNTER — Ambulatory Visit
Admission: RE | Admit: 2020-01-01 | Discharge: 2020-01-01 | Disposition: A | Discharge status: Home / Self Care | Payer: Medicare PPO | Source: Ambulatory Visit | Attending: Radiation Oncology | Admitting: Radiation Oncology

## 2020-01-01 DIAGNOSIS — C50911 Malignant neoplasm of unspecified site of right female breast: Secondary | ICD-10-CM | POA: Diagnosis not present

## 2020-01-02 ENCOUNTER — Ambulatory Visit
Admission: RE | Admit: 2020-01-02 | Discharge: 2020-01-02 | Disposition: A | Discharge status: Home / Self Care | Payer: Medicare PPO | Source: Ambulatory Visit | Attending: Radiation Oncology | Admitting: Radiation Oncology

## 2020-01-02 DIAGNOSIS — C50911 Malignant neoplasm of unspecified site of right female breast: Secondary | ICD-10-CM | POA: Diagnosis not present

## 2020-01-03 ENCOUNTER — Ambulatory Visit
Admission: RE | Admit: 2020-01-03 | Discharge: 2020-01-03 | Disposition: A | Discharge status: Home / Self Care | Payer: Medicare PPO | Source: Ambulatory Visit | Attending: Radiation Oncology | Admitting: Radiation Oncology

## 2020-01-03 DIAGNOSIS — C50911 Malignant neoplasm of unspecified site of right female breast: Secondary | ICD-10-CM | POA: Diagnosis not present

## 2020-01-04 ENCOUNTER — Ambulatory Visit
Admission: RE | Admit: 2020-01-04 | Discharge: 2020-01-04 | Disposition: A | Discharge status: Home / Self Care | Payer: Medicare PPO | Source: Ambulatory Visit | Attending: Radiation Oncology | Admitting: Radiation Oncology

## 2020-01-04 DIAGNOSIS — C50911 Malignant neoplasm of unspecified site of right female breast: Secondary | ICD-10-CM | POA: Diagnosis not present

## 2020-01-05 ENCOUNTER — Ambulatory Visit
Admission: RE | Admit: 2020-01-05 | Discharge: 2020-01-05 | Disposition: A | Discharge status: Home / Self Care | Payer: Medicare PPO | Source: Ambulatory Visit | Attending: Radiation Oncology | Admitting: Radiation Oncology

## 2020-01-05 DIAGNOSIS — C50911 Malignant neoplasm of unspecified site of right female breast: Secondary | ICD-10-CM | POA: Diagnosis not present

## 2020-01-08 ENCOUNTER — Ambulatory Visit
Admission: RE | Admit: 2020-01-08 | Discharge: 2020-01-08 | Disposition: A | Discharge status: Home / Self Care | Payer: Medicare PPO | Source: Ambulatory Visit | Attending: Radiation Oncology | Admitting: Radiation Oncology

## 2020-01-08 DIAGNOSIS — C50911 Malignant neoplasm of unspecified site of right female breast: Secondary | ICD-10-CM | POA: Diagnosis not present

## 2020-01-18 ENCOUNTER — Other Ambulatory Visit: Payer: Self-pay

## 2020-01-18 ENCOUNTER — Inpatient Hospital Stay (HOSPITAL_BASED_OUTPATIENT_CLINIC_OR_DEPARTMENT_OTHER): Payer: Medicare PPO | Admitting: Internal Medicine

## 2020-01-18 ENCOUNTER — Inpatient Hospital Stay: Payer: Medicare PPO | Attending: Internal Medicine

## 2020-01-18 DIAGNOSIS — Z7982 Long term (current) use of aspirin: Secondary | ICD-10-CM | POA: Insufficient documentation

## 2020-01-18 DIAGNOSIS — Z79811 Long term (current) use of aromatase inhibitors: Secondary | ICD-10-CM | POA: Insufficient documentation

## 2020-01-18 DIAGNOSIS — Z170 Estrogen receptor positive status [ER+]: Secondary | ICD-10-CM

## 2020-01-18 DIAGNOSIS — Z923 Personal history of irradiation: Secondary | ICD-10-CM | POA: Insufficient documentation

## 2020-01-18 DIAGNOSIS — C50211 Malignant neoplasm of upper-inner quadrant of right female breast: Secondary | ICD-10-CM | POA: Diagnosis not present

## 2020-01-18 DIAGNOSIS — I129 Hypertensive chronic kidney disease with stage 1 through stage 4 chronic kidney disease, or unspecified chronic kidney disease: Secondary | ICD-10-CM | POA: Diagnosis not present

## 2020-01-18 DIAGNOSIS — Z9013 Acquired absence of bilateral breasts and nipples: Secondary | ICD-10-CM | POA: Insufficient documentation

## 2020-01-18 DIAGNOSIS — N183 Chronic kidney disease, stage 3 unspecified: Secondary | ICD-10-CM | POA: Diagnosis not present

## 2020-01-18 DIAGNOSIS — Z79899 Other long term (current) drug therapy: Secondary | ICD-10-CM | POA: Insufficient documentation

## 2020-01-18 DIAGNOSIS — R5383 Other fatigue: Secondary | ICD-10-CM | POA: Diagnosis not present

## 2020-01-18 DIAGNOSIS — R5381 Other malaise: Secondary | ICD-10-CM | POA: Insufficient documentation

## 2020-01-18 DIAGNOSIS — M1990 Unspecified osteoarthritis, unspecified site: Secondary | ICD-10-CM | POA: Diagnosis not present

## 2020-01-18 DIAGNOSIS — C50912 Malignant neoplasm of unspecified site of left female breast: Secondary | ICD-10-CM | POA: Diagnosis not present

## 2020-01-18 DIAGNOSIS — L598 Other specified disorders of the skin and subcutaneous tissue related to radiation: Secondary | ICD-10-CM | POA: Diagnosis not present

## 2020-01-18 DIAGNOSIS — E7800 Pure hypercholesterolemia, unspecified: Secondary | ICD-10-CM | POA: Diagnosis not present

## 2020-01-18 LAB — CBC WITH DIFFERENTIAL/PLATELET
Abs Immature Granulocytes: 0.03 10*3/uL (ref 0.00–0.07)
Basophils Absolute: 0.1 10*3/uL (ref 0.0–0.1)
Basophils Relative: 2 %
Eosinophils Absolute: 0.6 10*3/uL — ABNORMAL HIGH (ref 0.0–0.5)
Eosinophils Relative: 11 %
HCT: 40.2 % (ref 36.0–46.0)
Hemoglobin: 13.6 g/dL (ref 12.0–15.0)
Immature Granulocytes: 1 %
Lymphocytes Relative: 13 %
Lymphs Abs: 0.8 10*3/uL (ref 0.7–4.0)
MCH: 31.4 pg (ref 26.0–34.0)
MCHC: 33.8 g/dL (ref 30.0–36.0)
MCV: 92.8 fL (ref 80.0–100.0)
Monocytes Absolute: 0.8 10*3/uL (ref 0.1–1.0)
Monocytes Relative: 14 %
Neutro Abs: 3.6 10*3/uL (ref 1.7–7.7)
Neutrophils Relative %: 59 %
Platelets: 167 10*3/uL (ref 150–400)
RBC: 4.33 MIL/uL (ref 3.87–5.11)
RDW: 13.3 % (ref 11.5–15.5)
WBC: 5.9 10*3/uL (ref 4.0–10.5)
nRBC: 0 % (ref 0.0–0.2)

## 2020-01-18 LAB — COMPREHENSIVE METABOLIC PANEL
ALT: 19 U/L (ref 0–44)
AST: 27 U/L (ref 15–41)
Albumin: 4.4 g/dL (ref 3.5–5.0)
Alkaline Phosphatase: 109 U/L (ref 38–126)
Anion gap: 10 (ref 5–15)
BUN: 47 mg/dL — ABNORMAL HIGH (ref 8–23)
CO2: 26 mmol/L (ref 22–32)
Calcium: 9.5 mg/dL (ref 8.9–10.3)
Chloride: 103 mmol/L (ref 98–111)
Creatinine, Ser: 1.83 mg/dL — ABNORMAL HIGH (ref 0.44–1.00)
GFR calc Af Amer: 28 mL/min — ABNORMAL LOW (ref >60)
GFR calc non Af Amer: 24 mL/min — ABNORMAL LOW (ref >60)
Glucose, Bld: 127 mg/dL — ABNORMAL HIGH (ref 70–99)
Potassium: 4.7 mmol/L (ref 3.5–5.1)
Sodium: 139 mmol/L (ref 135–145)
Total Bilirubin: 0.7 mg/dL (ref 0.3–1.2)
Total Protein: 8.1 g/dL (ref 6.5–8.1)

## 2020-01-18 MED ORDER — ANASTROZOLE 1 MG PO TABS
1.0000 mg | ORAL_TABLET | Freq: Every day | ORAL | 3 refills | Status: DC
Start: 2020-01-18 — End: 2020-04-26

## 2020-01-18 MED ORDER — TRIAMCINOLONE ACETONIDE 0.5 % EX OINT
1.0000 | TOPICAL_OINTMENT | Freq: Every day | CUTANEOUS | 0 refills | Status: DC
Start: 2020-01-18 — End: 2020-08-26

## 2020-01-18 NOTE — Patient Instructions (Addendum)
#   Take claritin daily/itch and rash # use kenalog iontment.  # Anastrzole [for breast cancer; do not start until the rash is better]

## 2020-01-18 NOTE — Progress Notes (Signed)
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 Burnett] stage III T2N3 [19/24 LN]; ER/PR positive HER-2 negative.   # Left breast-invasive mammary carcinoma-status post mastectomy [id number]; stage I-ER/PR positive HER-2 negative  #November 23, 2019-start adjuvant radiation bilateral [finished August 31]; no chemo;   #September mid-start anastrozole ----------------------------------------------------------------------------------------   A. RIGHT BREAST, 3:00; ULTRASOUND-GUIDED BIOPSY:  - INVASIVE MAMMARY CARCINOMA WITH MICROPAPILLARY FEATURES; Size of invasive carcinoma: 14m in this sample  Histologic grade of invasive carcinoma: Grade 2 ; ER/PR > 905: her 2- NEG  A. RIGHT BREAST, 3:00; ULTRASOUND-GUIDED BIOPSY: - INVASIVE MAMMARY CARCINOMA WITH MICROPAPILLARY FEATURES. 129min this sample. Grade 2. Lymphovascular invasion: Present.   B. LYMPH NODE, RIGHT AXILLARY; ULTRASOUND-GUIDED NEEDLE CORE BIOPSY: - METASTATIC MAMMARY CARCINOMA, 6 MM IN THIS SAMPLE.   C. RIGHT BREAST, LATERAL CALCIFICATIONS; STEREOTACTIC BIOPSY: - DUCTAL CARCINOMA IN SITU, INTERMEDIATE GRADE, WITH CALCIFICATIONS. -------------------------------------------------------------   # CKD- Stage III-IV [Dr.Kolluru]  # SURVIVORSHIP:   # GENETICS:   DIAGNOSIS:  Right breast cancer stage III;  left breast cancer-stage I GOALS: Cure  CURRENT/MOST RECENT THERAPY : Anastrozole    Carcinoma of upper-inner quadrant of right breast in female, estrogen receptor positive ([facility] 09/27/2019 Initial Diagnosis   Carcinoma of upper-inner quadrant of right breast in female, estrogen receptor positive  ([facility]     HISTORY OF PRESENTING ILLNESS:  Tina NEIDER89.o.  female synchronous bilateral breast cancer [right stage III ER/PR positive;Her2 NEG; left stage I ER/PR positive; Her2- NEG] currently undergoing adjuvant radiation is here for follow-up.  The interim patient finished radiation approximately 2 weeks ago.  Complains of a rash on her bilateral arms itchy [not in the radiation portal].  She also complains of radiation dermatitis-in her back.  She has been using Silvadene ointment.  Patient is quite emotional-she is concerned about her own health issue/cancer journey.  She is also concerned about her daughters.   Review of Systems  Constitutional: Positive for malaise/fatigue. Negative for chills, diaphoresis, fever and weight loss.  HENT: Negative for nosebleeds and sore throat.   Eyes: Negative for double vision.  Respiratory: Negative for cough, hemoptysis, sputum production, shortness of breath and wheezing.   Cardiovascular: Negative for chest pain, palpitations, orthopnea and leg swelling.  Gastrointestinal: Negative for abdominal pain, blood in stool, constipation, diarrhea, heartburn, melena, nausea and vomiting.  Genitourinary: Negative for dysuria, frequency and urgency.  Musculoskeletal: Positive for back pain and joint pain.  Skin: Positive for itching and rash.  Neurological: Negative for dizziness, tingling, focal weakness, weakness and headaches.  Endo/Heme/Allergies: Does not bruise/bleed easily.  Psychiatric/Behavioral: Negative for depression. The patient is not nervous/anxious and does not have insomnia.      MEDICAL HISTORY:  Past Medical History:  Diagnosis Date  . Arthritis    Gout  . Chronic kidney disease   . Diarrhea   . Hypercholesteremia   . Hypertension   . Neuropathy    feet and lower legs  . Seasonal allergies   . Skin cancer    face    SURGICAL HISTORY: Past Surgical History:  Procedure Laterality Date  . ABDOMINAL HYSTERECTOMY   2000  . APPENDECTOMY    . BREAST BIOPSY Right 09/19/2019   affirm  bx of calcs UOQ, x marker, path pending  . BREAST BIOPSY Right 09/19/2019   [place] bx of mass,heart marker, path pending  . BREAST BIOPSY Right 09/19/2019   [place] bx of LN, coil marker, path pending  . CATARACT EXTRACTION Left   . CATARACT EXTRACTION W/PHACO Right 10/24/2014   Procedure: CATARACT EXTRACTION PHACO AND INTRAOCULAR LENS PLACEMENT (IOC);  Surgeon: Leandrew Koyanagi, MD;  Location: [facility];  Service: Ophthalmology;  Laterality: Right;  . ESOPHAGOGASTRODUODENOSCOPY N/A 03/07/2018   Procedure: ESOPHAGOGASTRODUODENOSCOPY (EGD);  Surgeon: Lin Landsman, MD;  [facility] ENDOSCOPY;  Service: Gastroenterology;  Laterality: N/A;  . MASTECTOMY MODIFIED RADICAL Right 10/13/2019   Procedure: MASTECTOMY MODIFIED RADICAL;  Surgeon: Robert Bellow, MD;  Location: [facility] ORS;  Service: General;  Laterality: Right;  . RENAL ANGIOGRAPHY Right 04/11/2018   Procedure: RENAL ANGIOGRAPHY;  Surgeon: Algernon Huxley, MD;  Location: [facility];  Service: Cardiovascular;  Laterality: Right;  . SIMPLE MASTECTOMY WITH AXILLARY SENTINEL NODE BIOPSY Left 10/13/2019   Procedure: SIMPLE MASTECTOMY TRUE CUT BIOPSY, SENTINEL NODE BIOPSY;  Surgeon: Robert Bellow, MD;  Location: [facility] ORS;  Service: General;  Laterality: Left;    SOCIAL HISTORY: Social History   Socioeconomic History  . Marital status: Widowed    Spouse name: Not on file  . Number of children: Not on file  . Years of education: Not on file  . Highest education level: Not on file  Occupational History  . Not on file  Tobacco Use  . Smoking status: Never Smoker  . Smokeless tobacco: Never Used  Substance and Sexual Activity  . Alcohol use: No  . Drug use: Never  . Sexual activity: Not on file  Other Topics Concern  . Not on file  Social History Narrative   2 daughters; lives in [place]; Pharmacist, 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 Walks  cane/walker.    Social Determinants of Health   Financial Resource Strain:   . Difficulty of Paying Living Expenses: Not on file  Food Insecurity:   . Worried About Charity fundraiser in the Last Year: Not on file  . Ran Out of Food in the Last Year: Not on file  Transportation Needs:   . Lack of Transportation (Medical): Not on file  . Lack of Transportation (Non-Medical): Not on file  Physical Activity:   . Days of Exercise per Week: Not on file  . Minutes of Exercise per Session: Not on file  Stress:   . Feeling of Stress : Not on file  Social Connections:   . Frequency of Communication with Friends and Family: Not on file  . Frequency of Social Gatherings with Friends and Family: Not on file  . Attends Religious Services: Not on file  . Active Member of Clubs or Organizations: Not on file  . Attends Archivist Meetings: Not on file  . Marital Status: Not on file  Intimate Partner Violence:   . Fear of Current or Ex-Partner: Not on file  . Emotionally Abused: Not on file  . Physically Abused: Not on file  . Sexually Abused: Not on file    FAMILY HISTORY: Family History  Problem Relation Age of Onset  . Hypertension Mother   . Hypertension Father   . Stroke Father   . Breast cancer Daughter 1    ALLERGIES:  is allergic to penicillins and drug ingredient [zinc].  MEDICATIONS:  Current Outpatient Medications  Medication Sig Dispense Refill  . allopurinol (ZYLOPRIM)  300 MG tablet Take 300 mg by mouth daily.     Marland Kitchen amLODipine (NORVASC) 5 MG tablet Take 2.5 mg by mouth daily.   1  . aspirin EC 81 MG tablet Take 81 mg by mouth daily.    . Baclofen 5 MG TABS Take 2.5 mg by mouth in the morning and at bedtime.     . Cholecalciferol (VITAMIN D PO) Take 1 tablet by mouth daily as needed (winter months).     . citalopram (CELEXA) 10 MG tablet Take 10 mg by mouth daily.     Marland Kitchen docusate sodium (COLACE) 100 MG capsule Take 100 mg by mouth daily as needed for mild  constipation.    . DULoxetine (CYMBALTA) 60 MG capsule Take 60 mg by mouth daily.     . furosemide (LASIX) 20 MG tablet Take 20 mg by mouth daily.     Marland Kitchen gabapentin (NEURONTIN) 100 MG capsule Take 100 mg by mouth at bedtime.     . hydrALAZINE (APRESOLINE) 50 MG tablet Take 1 tablet (50 mg total) by mouth 2 (two) times daily. 30 tablet 0  . lidocaine-prilocaine (EMLA) cream Apply 1 application topically as needed (to breast).    Marland Kitchen loratadine (CLARITIN) 10 MG tablet Take 10 mg by mouth daily as needed for allergies. AM    . metoprolol succinate (TOPROL-XL) 25 MG 24 hr tablet Take 37.5 mg by mouth at bedtime.     . Multiple Vitamins-Minerals (ICAPS PO) Take 1 capsule by mouth in the morning and at bedtime.     . polyethylene glycol (MIRALAX / GLYCOLAX) packet Take 17 g by mouth daily as needed. 60 each 0  . simvastatin (ZOCOR) 20 MG tablet Take 20 mg by mouth every evening. PM     . anastrozole (ARIMIDEX) 1 MG tablet Take 1 tablet (1 mg total) by mouth daily. DO NOT START if RASH is NOT better 30 tablet 3  . clopidogrel (PLAVIX) 75 MG tablet TAKE 1 TABLET BY MOUTH EVERY DAY (Patient not taking: Reported on 01/18/2020) 30 tablet 1  . HYDROcodone-acetaminophen (NORCO/VICODIN) 5-325 MG tablet Take 0.5-1 tablets by mouth every 4 (four) hours as needed for moderate pain. (Patient not taking: Reported on 10/26/2019) 12 tablet 0  . triamcinolone ointment (KENALOG) 0.5 % Apply 1 application topically daily. Apply to rash. 30 g 0   No current facility-administered medications for this visit.      Marland Kitchen  PHYSICAL EXAMINATION: ECOG PERFORMANCE STATUS: 0 - Asymptomatic  Vitals:   01/18/20 0952  BP: 106/64  Pulse: 70  Resp: 16  Temp: 97.9 F (36.6 C)  SpO2: 99%   Filed Weights   01/18/20 0952  Weight: 145 lb (65.8 kg)    Physical Exam Constitutional:      Comments: Walks with a cane.with daughter.   HENT:     Head: Normocephalic and atraumatic.     Mouth/Throat:     Pharynx: No oropharyngeal  exudate.  Eyes:     Pupils: Pupils are equal, round, and reactive to light.  Cardiovascular:     Rate and Rhythm: Normal rate and regular rhythm.  Pulmonary:     Effort: Pulmonary effort is normal. No respiratory distress.     Breath sounds: Normal breath sounds. No wheezing.  Abdominal:     General: Bowel sounds are normal. There is no distension.     Palpations: Abdomen is soft. There is no mass.     Tenderness: There is no abdominal tenderness. There is no guarding  or rebound.  Musculoskeletal:        General: No tenderness. Normal range of motion.     Cervical back: Normal range of motion and neck supple.  Skin:    General: Skin is warm.     Comments: See images below   Neurological:     Mental Status: She is alert and oriented to person, place, and time.  Psychiatric:        Mood and Affect: Affect normal.    LABORATORY DATA:  I have reviewed the data as listed Lab Results  Component Value Date   WBC 5.9 01/18/2020   HGB 13.6 01/18/2020   HCT 40.2 01/18/2020   MCV 92.8 01/18/2020   PLT 167 01/18/2020   Recent Labs    11/17/19 1014 12/20/19 0910 01/18/20 0935  NA 142 140 139  K 4.6 4.9 4.7  CL 105 105 103  CO2 _0 GLUCOSE 139* 123* 127*  BUN 48* 38* 47*  CREATININE 1.88* 1.72* 1.83*  CALCIUM 9.5 9.4 9.5  GFRNONAA 23* 26* 24*  GFRAA 27* 30* 28*  PROT 7.4 7.4 8.1  ALBUMIN 4.1 4.3 4.4  AST _1 ALT _2 ALKPHOS 105 110 109  BILITOT 0.7 0.6 0.7          RADIOGRAPHIC STUDIES: I have personally reviewed the radiological images as listed and agreed with the findings in the report. No results found.  ASSESSMENT & PLAN:   Carcinoma of upper-inner quadrant of right breast in female, estrogen receptor positive ([place]) #Synchronous bilateral breast cancer-status post bilateral mastectomies-   Right Breast-stage III ER/PR positive HER-2 NEG; & Left breast cancer stage II ER/PR Pos; Her 2 NEG.  # s/p adjuvant Radiation [finishing 08/31]-  tolerated fairly well- with skin rash [see below].  Recommend starting anastrozole after skin rash resolved [see below; hopefully in next 2 weeks].   #Discussed the mechanism of action of aromatase inhibitors-with blocking of estrogen to prevent breast cancer.  Also discussed the potential side effects including but not limited to arthralgias hot flashes and increased risk of osteoporosis.  We will get a bone density test next few weeks.  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  Dematitis unclear etiology- recommend clairtin; kenalog ointment.  # CKD- Stage- IV- GFR 24 [Dr.Kolluru]; STABLE    # Depressed: Patient is currently on Celexa.  A long discussion regarding the importance of continued medical care.  Emotional support offered.  Patient declined any counseling.  Would recommend palliative care evaluation next visit.  # DISPOSITION: # follow up in 1 month- MD; labs; cbc/cmp/ca 27-29;ca 15-3;cea-Dr.B   On 9/10-I spoke to [name] patient's daughter-updated her of patient's clinical status.;  Also reviewed the slightly elevated tumor markers.   Cc;Dr.Baboff   All questions were answered. The patient/family knows to call the clinic with any problems, questions or concerns.     Cammie Sickle, MD 01/22/2020 8:36 AM

## 2020-01-18 NOTE — Assessment & Plan Note (Addendum)
#  Synchronous bilateral breast cancer-status post bilateral mastectomies-   Right Breast-stage III ER/PR positive HER-2 NEG; & Left breast cancer stage II ER/PR Pos; Her 2 NEG.  # s/p adjuvant Radiation [finishing 08/31]- tolerated fairly well- with skin rash [see below].  Recommend starting anastrozole after skin rash resolved [see below; hopefully in next 2 weeks].   #Discussed the mechanism of action of aromatase inhibitors-with blocking of estrogen to prevent breast cancer.  Also discussed the potential side effects including but not limited to arthralgias hot flashes and increased risk of osteoporosis.  We will get a bone density test next few weeks.  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  Dematitis unclear etiology- recommend clairtin; kenalog ointment.  # CKD- Stage- IV- GFR 24 [Dr.Kolluru]; STABLE    # Depressed: Patient is currently on Celexa.  A long discussion regarding the importance of continued medical care.  Emotional support offered.  Patient declined any counseling.  Would recommend palliative care evaluation next visit.  # DISPOSITION: # follow up in 1 month- MD; labs; cbc/cmp/ca 27-29;ca 15-3;cea-Dr.B   On 9/10-I spoke to [name] patient's daughter-updated her of patient's clinical status.;  Also reviewed the slightly elevated tumor markers.   Cc;Dr.Baboff

## 2020-01-19 LAB — CEA: CEA: 5.2 ng/mL — ABNORMAL HIGH (ref 0.0–4.7)

## 2020-01-19 LAB — CANCER ANTIGEN 27.29: CA 27.29: 65.1 U/mL — ABNORMAL HIGH (ref 0.0–38.6)

## 2020-01-19 LAB — CANCER ANTIGEN 15-3: CA 15-3: 48.5 U/mL — ABNORMAL HIGH (ref 0.0–25.0)

## 2020-02-06 ENCOUNTER — Ambulatory Visit: Payer: Medicare PPO | Admitting: Podiatry

## 2020-02-14 ENCOUNTER — Telehealth: Payer: Self-pay

## 2020-02-14 NOTE — Telephone Encounter (Signed)
Received phone call from Dr. Jomarie Longs dental office in regards to patient getting a dental cleaning and a panoramic facial x-ray. Informed office per Dr. B that it was ok for patient to have these things done. Faxed letter of clearance to fax number given. [phone].

## 2020-02-15 ENCOUNTER — Other Ambulatory Visit: Payer: Self-pay

## 2020-02-15 ENCOUNTER — Ambulatory Visit
Admission: RE | Admit: 2020-02-15 | Discharge: 2020-02-15 | Disposition: A | Discharge status: Home / Self Care | Payer: Medicare PPO | Source: Ambulatory Visit | Attending: Radiation Oncology | Admitting: Radiation Oncology

## 2020-02-15 ENCOUNTER — Encounter: Payer: Self-pay | Admitting: Radiation Oncology

## 2020-02-15 VITALS — BP 143/75 | HR 71 | Temp 96.8°F | Resp 16 | Wt 145.8 lb

## 2020-02-15 DIAGNOSIS — C773 Secondary and unspecified malignant neoplasm of axilla and upper limb lymph nodes: Secondary | ICD-10-CM | POA: Insufficient documentation

## 2020-02-15 DIAGNOSIS — C50211 Malignant neoplasm of upper-inner quadrant of right female breast: Secondary | ICD-10-CM

## 2020-02-15 DIAGNOSIS — C50412 Malignant neoplasm of upper-outer quadrant of left female breast: Secondary | ICD-10-CM | POA: Diagnosis not present

## 2020-02-15 DIAGNOSIS — Z170 Estrogen receptor positive status [ER+]: Secondary | ICD-10-CM | POA: Diagnosis not present

## 2020-02-15 DIAGNOSIS — Z9013 Acquired absence of bilateral breasts and nipples: Secondary | ICD-10-CM | POA: Insufficient documentation

## 2020-02-15 DIAGNOSIS — R630 Anorexia: Secondary | ICD-10-CM | POA: Insufficient documentation

## 2020-02-15 NOTE — Progress Notes (Signed)
Radiation Oncology Follow up Note  Name: Tina Curtis   Date:   02/15/2020 MRN:  [id number] [name]: 11-02-1926    This 86 y.o. female presents to the clinic today for 1 month follow-up status post bilateral axillary radiation.  For stage III (T2 N3 M0) ER/PR positive HER-2 negative invasive cancer of the right breast  REFERRING PROVIDER: Derinda Late, MD  HPI: Patient is a 86-year-old female now at 1 month having completed bilateral axillary radiation therapy for bilateral breast cancer seen today in routine follow-up she is doing well she specifically denies any chest wall complaints any swelling in her upper extremities cough or bone pain.  She is quite fatigued which has been slow to come back.  She also states she has a poor appetite although she her weight is stable..  She has been started on Arimidex and is tolerating that well.  COMPLICATIONS OF TREATMENT: none  FOLLOW UP COMPLIANCE: keeps appointments   PHYSICAL EXAM:  BP (!) 143/75 (BP Location: Left Arm, Patient Position: Sitting)   Pulse 71   Temp (!) 96.8 F (36 C) (Tympanic)   Resp 16   Wt 145 lb 12.8 oz (66.1 kg)   BMI 25.03 kg/m  Patient status post bilateral mastectomies no chest wall masses or nodularity are noted.  No axillary adenopathy or supraclavicular adenopathy is appreciated no evidence of lymphedema in her upper extremities is noted.  Well-developed well-nourished patient in NAD. HEENT reveals PERLA, EOMI, discs not visualized.  Oral cavity is clear. No oral mucosal lesions are identified. Neck is clear without evidence of cervical or supraclavicular adenopathy. Lungs are clear to A&P. Cardiac examination is essentially unremarkable with regular rate and rhythm without murmur rub or thrill. Abdomen is benign with no organomegaly or masses noted. Motor sensory and DTR levels are equal and symmetric in the upper and lower extremities. Cranial nerves II through XII are grossly intact. Proprioception is intact. No  peripheral adenopathy or edema is identified. No motor or sensory levels are noted. Crude visual fields are within normal range.  RADIOLOGY RESULTS: No current films for review  PLAN: Present time patient is doing well I have assured her her fatigue will improve and I suggested some increased activity such as walking.  Patient continues close follow-up care with medical oncology I have asked to see her back in 4 to 5 months for follow-up.  Patient knows to call with any concerns.  I would like to take this opportunity to thank you for allowing me to participate in the care of your patient.Noreene Filbert, MD

## 2020-02-26 ENCOUNTER — Encounter: Payer: Self-pay | Admitting: Internal Medicine

## 2020-02-26 ENCOUNTER — Inpatient Hospital Stay: Payer: Medicare PPO | Attending: Internal Medicine

## 2020-02-26 ENCOUNTER — Other Ambulatory Visit: Payer: Self-pay

## 2020-02-26 ENCOUNTER — Inpatient Hospital Stay (HOSPITAL_BASED_OUTPATIENT_CLINIC_OR_DEPARTMENT_OTHER): Payer: Medicare PPO | Admitting: Internal Medicine

## 2020-02-26 VITALS — BP 123/61 | HR 67 | Temp 98.0°F | Resp 16 | Ht 64.0 in | Wt 141.0 lb

## 2020-02-26 DIAGNOSIS — R5383 Other fatigue: Secondary | ICD-10-CM | POA: Diagnosis not present

## 2020-02-26 DIAGNOSIS — E7800 Pure hypercholesterolemia, unspecified: Secondary | ICD-10-CM | POA: Diagnosis not present

## 2020-02-26 DIAGNOSIS — C50211 Malignant neoplasm of upper-inner quadrant of right female breast: Secondary | ICD-10-CM

## 2020-02-26 DIAGNOSIS — Z79811 Long term (current) use of aromatase inhibitors: Secondary | ICD-10-CM | POA: Insufficient documentation

## 2020-02-26 DIAGNOSIS — F32A Depression, unspecified: Secondary | ICD-10-CM | POA: Diagnosis not present

## 2020-02-26 DIAGNOSIS — M810 Age-related osteoporosis without current pathological fracture: Secondary | ICD-10-CM | POA: Insufficient documentation

## 2020-02-26 DIAGNOSIS — Z170 Estrogen receptor positive status [ER+]: Secondary | ICD-10-CM | POA: Diagnosis not present

## 2020-02-26 DIAGNOSIS — Z7982 Long term (current) use of aspirin: Secondary | ICD-10-CM | POA: Diagnosis not present

## 2020-02-26 DIAGNOSIS — Z9013 Acquired absence of bilateral breasts and nipples: Secondary | ICD-10-CM | POA: Insufficient documentation

## 2020-02-26 DIAGNOSIS — Z923 Personal history of irradiation: Secondary | ICD-10-CM | POA: Insufficient documentation

## 2020-02-26 DIAGNOSIS — I129 Hypertensive chronic kidney disease with stage 1 through stage 4 chronic kidney disease, or unspecified chronic kidney disease: Secondary | ICD-10-CM | POA: Insufficient documentation

## 2020-02-26 DIAGNOSIS — R5381 Other malaise: Secondary | ICD-10-CM | POA: Diagnosis not present

## 2020-02-26 DIAGNOSIS — Z79899 Other long term (current) drug therapy: Secondary | ICD-10-CM | POA: Diagnosis not present

## 2020-02-26 DIAGNOSIS — N184 Chronic kidney disease, stage 4 (severe): Secondary | ICD-10-CM | POA: Diagnosis not present

## 2020-02-26 LAB — COMPREHENSIVE METABOLIC PANEL
ALT: 18 U/L (ref 0–44)
AST: 29 U/L (ref 15–41)
Albumin: 4.5 g/dL (ref 3.5–5.0)
Alkaline Phosphatase: 91 U/L (ref 38–126)
Anion gap: 8 (ref 5–15)
BUN: 38 mg/dL — ABNORMAL HIGH (ref 8–23)
CO2: 26 mmol/L (ref 22–32)
Calcium: 9.7 mg/dL (ref 8.9–10.3)
Chloride: 108 mmol/L (ref 98–111)
Creatinine, Ser: 1.65 mg/dL — ABNORMAL HIGH (ref 0.44–1.00)
GFR, Estimated: 27 mL/min — ABNORMAL LOW (ref >60)
Glucose, Bld: 140 mg/dL — ABNORMAL HIGH (ref 70–99)
Potassium: 4.6 mmol/L (ref 3.5–5.1)
Sodium: 142 mmol/L (ref 135–145)
Total Bilirubin: 0.7 mg/dL (ref 0.3–1.2)
Total Protein: 7.8 g/dL (ref 6.5–8.1)

## 2020-02-26 LAB — CBC WITH DIFFERENTIAL/PLATELET
Abs Immature Granulocytes: 0.03 10*3/uL (ref 0.00–0.07)
Basophils Absolute: 0.1 10*3/uL (ref 0.0–0.1)
Basophils Relative: 1 %
Eosinophils Absolute: 0.4 10*3/uL (ref 0.0–0.5)
Eosinophils Relative: 7 %
HCT: 38.8 % (ref 36.0–46.0)
Hemoglobin: 13.1 g/dL (ref 12.0–15.0)
Immature Granulocytes: 1 %
Lymphocytes Relative: 18 %
Lymphs Abs: 1.1 10*3/uL (ref 0.7–4.0)
MCH: 31.7 pg (ref 26.0–34.0)
MCHC: 33.8 g/dL (ref 30.0–36.0)
MCV: 93.9 fL (ref 80.0–100.0)
Monocytes Absolute: 0.7 10*3/uL (ref 0.1–1.0)
Monocytes Relative: 12 %
Neutro Abs: 3.7 10*3/uL (ref 1.7–7.7)
Neutrophils Relative %: 61 %
Platelets: 152 10*3/uL (ref 150–400)
RBC: 4.13 MIL/uL (ref 3.87–5.11)
RDW: 13.8 % (ref 11.5–15.5)
WBC: 6.1 10*3/uL (ref 4.0–10.5)
nRBC: 0 % (ref 0.0–0.2)

## 2020-02-26 NOTE — Assessment & Plan Note (Addendum)
#  Synchronous bilateral breast cancer-status post bilateral mastectomies-   Right Breast-stage III ER/PR positive HER-2 NEG; & Left breast cancer stage II ER/PR Pos; Her 2 NEG. Currently on anastrozole; reviewed the tumor markers in September 2021.  #Continue anastrozole.  Tolerating fairly well.  Await tumor markers from today.  # Radiation dermatitis new- G-1- resolved.  # Rash on Bil Arm- resolved s/p kenalog ointment.  # CKD- Stage- IV- GFR 27 [Dr.Kolluru]; STABLE.   # Depressed: Patient is currently on Celexa; stable.  Patient coping fairly well the social stresses.  # Osteoporosis screening: Discussed concerns for osteoporosis given her age/other risk factors; recommend a bone density test especially as she is currently on anastrozole.  # DISPOSITION: # follow up in 1 month- MD; labs; cbc/cmp/ca 27-29;ca 15-3;cea; BMD prior--Dr.B   Cc;Dr.Baboff

## 2020-02-26 NOTE — Progress Notes (Signed)
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 Burnett] stage III T2N3 [19/24 LN]; ER/PR positive HER-2 negative.   # Left breast-invasive mammary carcinoma-status post mastectomy [id number]; stage I-ER/PR positive HER-2 negative  #November 23, 2019-start adjuvant radiation bilateral [finished August 31]; no chemo;   #September 22nd 2021- anastrozole ----------------------------------------------------------------------------------------   A. RIGHT BREAST, 3:00; ULTRASOUND-GUIDED BIOPSY:  - INVASIVE MAMMARY CARCINOMA WITH MICROPAPILLARY FEATURES; Size of invasive carcinoma: 14m in this sample  Histologic grade of invasive carcinoma: Grade 2 ; ER/PR > 905: her 2- NEG  A. RIGHT BREAST, 3:00; ULTRASOUND-GUIDED BIOPSY: - INVASIVE MAMMARY CARCINOMA WITH MICROPAPILLARY FEATURES. 123min this sample. Grade 2. Lymphovascular invasion: Present.   B. LYMPH NODE, RIGHT AXILLARY; ULTRASOUND-GUIDED NEEDLE CORE BIOPSY: - METASTATIC MAMMARY CARCINOMA, 6 MM IN THIS SAMPLE.   C. RIGHT BREAST, LATERAL CALCIFICATIONS; STEREOTACTIC BIOPSY: - DUCTAL CARCINOMA IN SITU, INTERMEDIATE GRADE, WITH CALCIFICATIONS. -------------------------------------------------------------   # CKD- Stage III-IV [Dr.Kolluru]  # SURVIVORSHIP:   # GENETICS:   DIAGNOSIS:  Right breast cancer stage III;  left breast cancer-stage I GOALS: Cure  CURRENT/MOST RECENT THERAPY : Anastrozole    Carcinoma of upper-inner quadrant of right breast in [place], estrogen receptor positive [place] 09[place] Initial Diagnosis   Carcinoma of upper-inner quadrant of right breast in female, estrogen receptor positive  ([facility]     HISTORY OF PRESENTING ILLNESS:   FaLEVENIA SKALICKY62.o.  female synchronous bilateral breast cancer [right stage III ER/PR positive;Her2 NEG; left stage I ER/PR positive; Her2- NEG] s/p adjuvant radiation; currently on Anastrazole is here for follow-up.  In the interim patient [place] expected she is quite distressed.  S/p radiation-patient skin rash resolved.  Patient's rash on her arms also resolved.  Patient has been taking anastrozole for the last 1 month or so.  Denies any worsening joint pains bone pain.  Denies any worsening hot flashes.  Review of Systems  Constitutional: Positive for malaise/fatigue. Negative for chills, diaphoresis, fever and weight loss.  HENT: Negative for nosebleeds and sore throat.   Eyes: Negative for double vision.  Respiratory: Negative for cough, hemoptysis, sputum production, shortness of breath and wheezing.   Cardiovascular: Negative for chest pain, palpitations, orthopnea and leg swelling.  Gastrointestinal: Negative for abdominal pain, blood in stool, constipation, diarrhea, heartburn, melena, nausea and vomiting.  Genitourinary: Negative for dysuria, frequency and urgency.  Musculoskeletal: Positive for back pain and joint pain.  Neurological: Negative for dizziness, tingling, focal weakness, weakness and headaches.  Endo/Heme/Allergies: Does not bruise/bleed easily.  Psychiatric/Behavioral: Negative for depression. The patient is not nervous/anxious and does not have insomnia.      MEDICAL HISTORY:  Past Medical History:  Diagnosis Date  . Arthritis    Gout  . Chronic kidney disease   . Diarrhea   . Hypercholesteremia   . Hypertension   . Neuropathy    feet and lower legs  . Seasonal allergies   . Skin cancer    face    SURGICAL HISTORY: Past Surgical History:  Procedure Laterality Date  . ABDOMINAL HYSTERECTOMY  2000  . APPENDECTOMY    . BREAST BIOPSY Right 09/19/2019   affirm bx of  calcs UOQ, x marker, path pending  .  BREAST BIOPSY Right 09/19/2019   [place] bx of mass,heart marker, path pending  . BREAST BIOPSY Right 09/19/2019   [place] bx of LN, coil marker, path pending  . CATARACT EXTRACTION Left   . CATARACT EXTRACTION W/PHACO Right 10/24/2014   Procedure: CATARACT EXTRACTION PHACO AND INTRAOCULAR LENS PLACEMENT (IOC);  Surgeon: Leandrew Koyanagi, MD;  Location: [place];  Service: Ophthalmology;  Laterality: Right;  . ESOPHAGOGASTRODUODENOSCOPY N/A 03/07/2018   Procedure: ESOPHAGOGASTRODUODENOSCOPY (EGD);  Surgeon: Lin Landsman, MD;  Location: [facility] ENDOSCOPY;  Service: Gastroenterology;  Laterality: N/A;  . MASTECTOMY MODIFIED RADICAL Right 10/13/2019   Procedure: MASTECTOMY MODIFIED RADICAL;  Surgeon: Robert Bellow, MD;  Location: [facility] ORS;  Service: General;  Laterality: Right;  . RENAL ANGIOGRAPHY Right 04/11/2018   Procedure: RENAL ANGIOGRAPHY;  Surgeon: Algernon Huxley, MD;  Location: [place] CV LAB;  Service: Cardiovascular;  Laterality: Right;  . SIMPLE MASTECTOMY WITH AXILLARY SENTINEL NODE BIOPSY Left 10/13/2019   Procedure: SIMPLE MASTECTOMY TRUE CUT BIOPSY, SENTINEL NODE BIOPSY;  Surgeon: Robert Bellow, MD;  Location: [facility] ORS;  Service: General;  Laterality: Left;    SOCIAL HISTORY: Social History   Socioeconomic History  . Marital status: Widowed    Spouse name: Not on file  . Number of children: Not on file  . Years of education: Not on file  . Highest education level: Not on file  Occupational History  . Not on file  Tobacco Use  . Smoking status: Never Smoker  . Smokeless tobacco: Never Used  Substance and Sexual Activity  . Alcohol use: No  . Drug use: Never  . Sexual activity: Not on file  Other Topics Concern  . Not on file  Social History Narrative   2 daughters; lives in [place]; Pharmacist, 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    Social Determinants of Health   Financial Resource  Strain:   . Difficulty of Paying Living Expenses: Not on file  Food Insecurity:   . Worried About Charity fundraiser in the Last Year: Not on file  . Ran Out of Food in the Last Year: Not on file  Transportation Needs:   . Lack of Transportation (Medical): Not on file  . Lack of Transportation (Non-Medical): Not on file  Physical Activity:   . Days of Exercise per Week: Not on file  . Minutes of Exercise per Session: Not on file  Stress:   . Feeling of Stress : Not on file  Social Connections:   . Frequency of Communication with Friends and Family: Not on file  . Frequency of Social Gatherings with Friends and Family: Not on file  . Attends Religious Services: Not on file  . Active Member of Clubs or Organizations: Not on file  . Attends Archivist Meetings: Not on file  . Marital Status: Not on file  Intimate Partner Violence:   . Fear of Current or Ex-Partner: Not on file  . Emotionally Abused: Not on file  . Physically Abused: Not on file  . Sexually Abused: Not on file    FAMILY HISTORY: Family History  Problem Relation Age of Onset  . Hypertension Mother   . Hypertension Father   . Stroke Father   . Breast cancer Daughter 35    ALLERGIES:  is allergic to penicillins and drug ingredient [zinc].  MEDICATIONS:  Current Outpatient Medications  Medication Sig Dispense Refill  . allopurinol (ZYLOPRIM) 100 MG tablet     . amLODipine (NORVASC)  5 MG tablet Take 2.5 mg by mouth daily.   1  . anastrozole (ARIMIDEX) 1 MG tablet Take 1 tablet (1 mg total) by mouth daily. DO NOT START if RASH is NOT better 30 tablet 3  . aspirin EC 81 MG tablet Take 81 mg by mouth daily.    . Baclofen 5 MG TABS Take 2.5 mg by mouth in the morning and at bedtime.     . Cholecalciferol (VITAMIN D PO) Take 1 tablet by mouth daily as needed (winter months).     . citalopram (CELEXA) 10 MG tablet Take 10 mg by mouth daily.     . clopidogrel (PLAVIX) 75 MG tablet TAKE 1 TABLET BY MOUTH  EVERY DAY 30 tablet 1  . docusate sodium (COLACE) 100 MG capsule Take 100 mg by mouth daily as needed for mild constipation.    . DULoxetine (CYMBALTA) 60 MG capsule Take 60 mg by mouth daily.     . furosemide (LASIX) 20 MG tablet Take 20 mg by mouth daily.     Marland Kitchen gabapentin (NEURONTIN) 100 MG capsule Take 100 mg by mouth at bedtime.     . hydrALAZINE (APRESOLINE) 50 MG tablet Take 1 tablet (50 mg total) by mouth 2 (two) times daily. 30 tablet 0  . lidocaine-prilocaine (EMLA) cream Apply 1 application topically as needed (to breast).    Marland Kitchen loratadine (CLARITIN) 10 MG tablet Take 10 mg by mouth daily as needed for allergies. AM    . metoprolol succinate (TOPROL-XL) 25 MG 24 hr tablet Take 37.5 mg by mouth at bedtime.     . Multiple Vitamins-Minerals (ICAPS PO) Take 1 capsule by mouth in the morning and at bedtime.     . polyethylene glycol (MIRALAX / GLYCOLAX) packet Take 17 g by mouth daily as needed. 60 each 0  . simvastatin (ZOCOR) 20 MG tablet Take 20 mg by mouth every evening. PM     . triamcinolone ointment (KENALOG) 0.5 % Apply 1 application topically daily. Apply to rash. 30 g 0  . HYDROcodone-acetaminophen (NORCO/VICODIN) 5-325 MG tablet Take 0.5-1 tablets by mouth every 4 (four) hours as needed for moderate pain. (Patient not taking: Reported on 10/26/2019) 12 tablet 0   No current facility-administered medications for this visit.      Marland Kitchen  PHYSICAL EXAMINATION: ECOG PERFORMANCE STATUS: 0 - Asymptomatic  Vitals:   02/26/20 1008  BP: 123/61  Pulse: 67  Resp: 16  Temp: 98 F (36.7 C)  SpO2: 100%   Filed Weights   02/26/20 1008  Weight: 141 lb (64 kg)    Physical Exam Constitutional:      Comments: Walks with a cane.with daughter.   HENT:     Head: Normocephalic and atraumatic.     Mouth/Throat:     Pharynx: No oropharyngeal exudate.  Eyes:     Pupils: Pupils are equal, round, and reactive to light.  Cardiovascular:     Rate and Rhythm: Normal rate and regular rhythm.   Pulmonary:     Effort: Pulmonary effort is normal. No respiratory distress.     Breath sounds: Normal breath sounds. No wheezing.  Abdominal:     General: Bowel sounds are normal. There is no distension.     Palpations: Abdomen is soft. There is no mass.     Tenderness: There is no abdominal tenderness. There is no guarding or rebound.  Musculoskeletal:        General: No tenderness. Normal range of motion.  Cervical back: Normal range of motion and neck supple.  Skin:    General: Skin is warm.     Comments: See images below   Neurological:     Mental Status: She is alert and oriented to person, place, and time.  Psychiatric:        Mood and Affect: Affect normal.    LABORATORY DATA:  I have reviewed the data as listed Lab Results  Component Value Date   WBC 6.1 02/26/2020   HGB 13.1 02/26/2020   HCT 38.8 02/26/2020   MCV 93.9 02/26/2020   PLT 152 02/26/2020   Recent Labs    11/17/19 1014 11/17/19 1014 12/20/19 0910 01/18/20 0935 02/26/20 0954  NA 142   < > 140 139 142  K 4.6   < > 4.9 4.7 4.6  CL 105   < > 105 103 108  CO2 27   < > _0 GLUCOSE 139*   < > 123* 127* 140*  BUN 48*   < > 38* 47* 38*  CREATININE 1.88*   < > 1.72* 1.83* 1.65*  CALCIUM 9.5   < > 9.4 9.5 9.7  GFRNONAA 23*   < > 26* 24* 27*  GFRAA 27*  --  30* 28*  --   PROT 7.4   < > 7.4 8.1 7.8  ALBUMIN 4.1   < > 4.3 4.4 4.5  AST 27   < > _1 ALT 16   < > _2 ALKPHOS 105   < > 110 109 91  BILITOT 0.7   < > 0.6 0.7 0.7   < > = values in this interval not displayed.          RADIOGRAPHIC STUDIES: I have personally reviewed the radiological images as listed and agreed with the findings in the report. No results found.  ASSESSMENT & PLAN:   Carcinoma of upper-inner quadrant of right breast in female, estrogen receptor positive ([place]) #Synchronous bilateral breast cancer-status post bilateral mastectomies-   Right Breast-stage III ER/PR positive HER-2 NEG; & Left breast  cancer stage II ER/PR Pos; Her 2 NEG. Currently on anastrozole; reviewed the tumor markers in September 2021.  #Continue anastrozole.  Tolerating fairly well.  Await tumor markers from today.  # Radiation dermatitis new- G-1- resolved.  # Rash on Bil Arm- resolved s/p kenalog ointment.  # CKD- Stage- IV- GFR 27 [Dr.Kolluru]; STABLE.   # Depressed: Patient is currently on Celexa; stable.  Patient coping fairly well the social stresses.  # Osteoporosis screening: Discussed concerns for osteoporosis given her age/other risk factors; recommend a bone density test especially as she is currently on anastrozole.  # DISPOSITION: # follow up in 1 month- MD; labs; cbc/cmp/ca 27-29;ca 15-3;cea; BMD prior--Dr.B   Cc;Dr.Baboff   All questions were answered. The patient/family knows to call the clinic with any problems, questions or concerns.     Cammie Sickle, MD 02/26/2020 11:03 AM

## 2020-02-27 ENCOUNTER — Telehealth: Payer: Self-pay | Admitting: Internal Medicine

## 2020-02-27 ENCOUNTER — Ambulatory Visit (INDEPENDENT_AMBULATORY_CARE_PROVIDER_SITE_OTHER): Payer: Medicare PPO | Admitting: Vascular Surgery

## 2020-02-27 ENCOUNTER — Ambulatory Visit (INDEPENDENT_AMBULATORY_CARE_PROVIDER_SITE_OTHER): Payer: Medicare PPO

## 2020-02-27 ENCOUNTER — Encounter (INDEPENDENT_AMBULATORY_CARE_PROVIDER_SITE_OTHER): Payer: Self-pay | Admitting: Vascular Surgery

## 2020-02-27 VITALS — BP 126/68 | HR 79 | Ht 64.0 in | Wt 144.0 lb

## 2020-02-27 DIAGNOSIS — I701 Atherosclerosis of renal artery: Secondary | ICD-10-CM | POA: Diagnosis not present

## 2020-02-27 DIAGNOSIS — I150 Renovascular hypertension: Secondary | ICD-10-CM

## 2020-02-27 DIAGNOSIS — E7800 Pure hypercholesterolemia, unspecified: Secondary | ICD-10-CM | POA: Diagnosis not present

## 2020-02-27 LAB — CEA: CEA: 5.7 ng/mL — ABNORMAL HIGH (ref 0.0–4.7)

## 2020-02-27 LAB — CANCER ANTIGEN 15-3: CA 15-3: 38.3 U/mL — ABNORMAL HIGH (ref 0.0–25.0)

## 2020-02-27 LAB — CANCER ANTIGEN 27.29: CA 27.29: 48.2 U/mL — ABNORMAL HIGH (ref 0.0–38.6)

## 2020-02-27 NOTE — Telephone Encounter (Signed)
On 10/18-spoke to daughter [name] regarding her mother's plan of care.  Awaiting on tumor markers.  Continue aromatase inhibitor for now.

## 2020-02-27 NOTE — Assessment & Plan Note (Signed)
Renal artery duplex though shows a patent right renal artery stent without recurrent stenosis and no flow in the left renal artery which is stable.  She will continue her current medical regimen.  Given the solitary nature of her right renal intervention making this a much more high risk scenario, I would prefer to continue to follow this on 64-month intervals rather than yearly.  She is agreeable.

## 2020-02-27 NOTE — Progress Notes (Signed)
MRN : [id number]  Tina Curtis is a 86 y.o. (26-Sep-1926) female who presents with chief complaint of  Chief Complaint  Patient presents with  . Follow-up    77mo U/S  .  History of Present Illness: Patient returns today in follow up of her renal artery stenosis.  She was found to have a high-grade right renal artery stenosis and a solitary kidney back in 2019.  We did intervention which has markedly improved her blood pressure control.  She follows with nephrology for chronic kidney disease which has been reasonably stable.  Since her last visit, she has been undergoing treatment for breast cancer including mastectomy and radiation.  This has decreased her energy but overall she continues to do fairly well.  Renal artery duplex though shows a patent right renal artery stent without recurrent stenosis and no flow in the left renal artery which is stable.  Current Outpatient Medications  Medication Sig Dispense Refill  . allopurinol (ZYLOPRIM) 100 MG tablet     . amLODipine (NORVASC) 5 MG tablet Take 2.5 mg by mouth daily.   1  . anastrozole (ARIMIDEX) 1 MG tablet Take 1 tablet (1 mg total) by mouth daily. DO NOT START if RASH is NOT better 30 tablet 3  . aspirin EC 81 MG tablet Take 81 mg by mouth daily.    . Baclofen 5 MG TABS Take 2.5 mg by mouth in the morning and at bedtime.     . Cholecalciferol (VITAMIN D PO) Take 1 tablet by mouth daily as needed (winter months).     . citalopram (CELEXA) 10 MG tablet Take 10 mg by mouth daily.     . clopidogrel (PLAVIX) 75 MG tablet TAKE 1 TABLET BY MOUTH EVERY DAY 30 tablet 1  . docusate sodium (COLACE) 100 MG capsule Take 100 mg by mouth daily as needed for mild constipation.    . DULoxetine (CYMBALTA) 60 MG capsule Take 60 mg by mouth daily.     . furosemide (LASIX) 20 MG tablet Take 20 mg by mouth daily.     Marland Kitchen gabapentin (NEURONTIN) 100 MG capsule Take 100 mg by mouth at bedtime.     . hydrALAZINE (APRESOLINE) 50 MG tablet Take 1 tablet (50  mg total) by mouth 2 (two) times daily. 30 tablet 0  . HYDROcodone-acetaminophen (NORCO/VICODIN) 5-325 MG tablet Take 0.5-1 tablets by mouth every 4 (four) hours as needed for moderate pain. 12 tablet 0  . lidocaine-prilocaine (EMLA) cream Apply 1 application topically as needed (to breast).    Marland Kitchen loratadine (CLARITIN) 10 MG tablet Take 10 mg by mouth daily as needed for allergies. AM    . metoprolol succinate (TOPROL-XL) 25 MG 24 hr tablet Take 37.5 mg by mouth at bedtime.     . Multiple Vitamins-Minerals (ICAPS PO) Take 1 capsule by mouth in the morning and at bedtime.     . polyethylene glycol (MIRALAX / GLYCOLAX) packet Take 17 g by mouth daily as needed. 60 each 0  . simvastatin (ZOCOR) 20 MG tablet Take 20 mg by mouth every evening. PM     . triamcinolone ointment (KENALOG) 0.5 % Apply 1 application topically daily. Apply to rash. 30 g 0   No current facility-administered medications for this visit.    Past Medical History:  Diagnosis Date  . Arthritis    Gout  . Chronic kidney disease   . Diarrhea   . Hypercholesteremia   . Hypertension   . Neuropathy  feet and lower legs  . Seasonal allergies   . Skin cancer    face    Past Surgical History:  Procedure Laterality Date  . ABDOMINAL HYSTERECTOMY  2000  . APPENDECTOMY    . BREAST BIOPSY Right 09/19/2019   affirm bx of calcs UOQ, x marker, path pending  . BREAST BIOPSY Right 09/19/2019   [place] bx of mass,heart marker, path pending  . BREAST BIOPSY Right 09/19/2019   [place] bx of LN, coil marker, path pending  . CATARACT EXTRACTION Left   . CATARACT EXTRACTION W/PHACO Right 10/24/2014   Procedure: CATARACT EXTRACTION PHACO AND INTRAOCULAR LENS PLACEMENT (IOC);  Surgeon: Leandrew Koyanagi, MD;  Location: [place];  Service: Ophthalmology;  Laterality: Right;  . ESOPHAGOGASTRODUODENOSCOPY N/A 03/07/2018   Procedure: ESOPHAGOGASTRODUODENOSCOPY (EGD);  Surgeon: Lin Landsman, MD;  Location: [facility] ENDOSCOPY;   Service: Gastroenterology;  Laterality: N/A;  . MASTECTOMY MODIFIED RADICAL Right 10/13/2019   Procedure: MASTECTOMY MODIFIED RADICAL;  Surgeon: Robert Bellow, MD;  Location: [facility] ORS;  Service: General;  Laterality: Right;  . RENAL ANGIOGRAPHY Right 04/11/2018   Procedure: RENAL ANGIOGRAPHY;  Surgeon: Algernon Huxley, MD;  Location: [place] [facility];  Service: Cardiovascular;  Laterality: Right;  . SIMPLE MASTECTOMY WITH AXILLARY SENTINEL NODE BIOPSY Left 10/13/2019   Procedure: SIMPLE MASTECTOMY TRUE CUT BIOPSY, SENTINEL NODE BIOPSY;  Surgeon: Robert Bellow, MD;  Location: [facility] ORS;  Service: General;  Laterality: Left;     Social History   Tobacco Use  . Smoking status: Never Smoker  . Smokeless tobacco: Never Used  Substance Use Topics  . Alcohol use: No  . Drug use: Never     Family History  Problem Relation Age of Onset  . Hypertension Mother   . Hypertension Father   . Stroke Father   . Breast cancer Daughter 70     Allergies  Allergen Reactions  . Penicillins Anaphylaxis  . Drug Ingredient [Zinc]     REVIEW OF SYSTEMS(Negative unless checked)  Constitutional: [] ????Weight loss[] ????Fever[] ????Chills Cardiac:[] ????Chest pain[] ????Chest pressure[] ????Palpitations [] ????Shortness of breath when laying flat [] ????Shortness of breath at rest [] ????Shortness of breath with exertion. Vascular: [] ????Pain in legs with walking[] ????Pain in legsat rest[] ????Pain in legs when laying flat [] ????Claudication [] ????Pain in feet when walking [] ????Pain in feet at rest [] ????Pain in feet when laying flat [] ????History of DVT [] ????Phlebitis [] ????Swelling in legs [] ????Varicose veins [] ????Non-healing ulcers Pulmonary: [] ????Uses home oxygen [] ????Productive cough[] ????Hemoptysis [] ????Wheeze [] ????COPD [] ????Asthma Neurologic: [] ????Dizziness [] ????Blackouts [] ????Seizures [] ????History of stroke [] ????History of  TIA[] ????Aphasia [] ????Temporary blindness[] ????Dysphagia [] ????Weaknessor numbness in arms [x] ????Weakness or numbnessin legs Musculoskeletal: [x] ????Arthritis [] ????Joint swelling [x] ????Joint pain [] ????Low back pain Hematologic:[] ????Easy bruising[] ????Easy bleeding [] ????Hypercoagulable state [] ????Anemic [] ????Hepatitis Gastrointestinal:[] ????Blood in stool[] ????Vomiting blood[] ????Gastroesophageal reflux/heartburn[] ????Abdominal pain Genitourinary: [x] ????Chronic kidney disease [x] ????Difficulturination [] ????Frequenturination [] ????Burning with urination[] ????Hematuria Skin: [] ????Rashes [] ????Ulcers [] ????Wounds Psychological: [] ????History of anxiety[] ????History of major depression.  Physical Examination  BP 126/68   Pulse 79   Ht 5\' 4"  (1.626 m)   Wt 144 lb (65.3 kg)   BMI 24.72 kg/m  Gen:  WD/WN, NAD. Appears far younger than stated age Head: [place]/AT, No temporalis wasting. Ear/Nose/Throat: Hearing grossly intact, nares w/o erythema or drainage Eyes: Conjunctiva clear. Sclera non-icteric Neck: Supple.  Trachea midline Pulmonary:  Good air movement, no use of accessory muscles.  Cardiac: RRR, no JVD Vascular:  Vessel Right Left  Radial Palpable Palpable           Musculoskeletal: M/S 5/5 throughout.  No deformity or atrophy. No edema. Neurologic: Sensation grossly intact in extremities.  Symmetrical.  Speech  is fluent.  Psychiatric: Judgment intact, Mood & affect appropriate for pt's clinical situation. Dermatologic: No rashes or ulcers noted.  No cellulitis or open wounds.       Labs Recent Results (from the past 2160 hour(s))  CEA     Status: None   Collection Time: 12/20/19  9:10 AM  Result Value Ref Range   CEA 4.5 0.0 - 4.7 ng/mL    Comment: (NOTE)                             Nonsmokers          <3.9                             Smokers             <5.6 Roche [employer] Electrochemiluminescence  Immunoassay (ECLIA) Values obtained with different assay methods or kits cannot be used interchangeably.  Results cannot be interpreted as absolute evidence of the presence or absence of malignant disease. Performed At: [facility] [place], [place] [id number] Rush Farmer MD JI:[id number]   Cancer antigen 15-3     Status: Abnormal   Collection Time: 12/20/19  9:10 AM  Result Value Ref Range   CA 15-3 57.0 (H) 0.0 - 25.0 U/mL    Comment: (NOTE) Roche [employer]ostics Electrochemiluminescence Immunoassay (ECLIA) Values obtained with different assay methods or kits cannot be used interchangeably.  Results cannot be interpreted as absolute evidence of the presence or absence of malignant disease. Performed At: [facility] [place], [place] [id number] Rush Farmer MD [id number]   Cancer antigen 27.29     Status: Abnormal   Collection Time: 12/20/19  9:10 AM  Result Value Ref Range   CA 27.29 82.6 (H) 0.0 - 38.6 U/mL    Comment: (NOTE) Siemens Centaur Immunochemiluminometric Methodology [facility]) Values obtained with different assay methods or kits cannot be used interchangeably. Results cannot be interpreted as absolute evidence of the presence or absence of malignant disease. Performed At: [facility] [place], [place] [id number] Rush Farmer MD PY:[id number]   CBC with Differential     Status: None   Collection Time: 12/20/19  9:10 AM  Result Value Ref Range   WBC 5.7 4.0 - 10.5 K/uL   RBC 3.96 3.87 - 5.11 MIL/uL   Hemoglobin 12.5 12.0 - 15.0 g/dL   HCT 36.8 36 - 46 %   MCV 92.9 80.0 - 100.0 fL   MCH 31.6 26.0 - 34.0 pg   MCHC 34.0 30.0 - 36.0 g/dL   RDW 13.3 11.5 - 15.5 %   Platelets 162 150 - 400 K/uL   nRBC 0.0 0.0 - 0.2 %   Neutrophils Relative % 59 %   Neutro Abs 3.3 1.7 - 7.7 K/uL   Lymphocytes Relative 20 %   Lymphs Abs 1.2 0.7 - 4.0 K/uL   Monocytes Relative 12 %   Monocytes Absolute 0.7  0.1 - 1.0 K/uL   Eosinophils Relative 7 %   Eosinophils Absolute 0.4 0.0 - 0.5 K/uL   Basophils Relative 1 %   Basophils Absolute 0.1 0.0 - 0.1 K/uL   Immature Granulocytes 1 %   Abs Immature Granulocytes 0.03 0.00 - 0.07 K/uL    Comment: Performed at [facility], [address] [place], [place] [address]  Comprehensive metabolic panel     Status: Abnormal  Collection Time: 12/20/19  9:10 AM  Result Value Ref Range   Sodium 140 135 - 145 mmol/L   Potassium 4.9 3.5 - 5.1 mmol/L   Chloride 105 98 - 111 mmol/L   CO2 25 22 - 32 mmol/L   Glucose, Bld 123 (H) 70 - 99 mg/dL    Comment: Glucose reference range applies only to samples taken after fasting for at least 8 hours.   BUN 38 (H) 8 - 23 mg/dL   Creatinine, Ser 1.72 (H) 0.44 - 1.00 mg/dL   Calcium 9.4 8.9 - 10.3 mg/dL   Total Protein 7.4 6.5 - 8.1 g/dL   Albumin 4.3 3.5 - 5.0 g/dL   AST 25 15 - 41 U/L   ALT 15 0 - 44 U/L   Alkaline Phosphatase 110 38 - 126 U/L   Total Bilirubin 0.6 0.3 - 1.2 mg/dL   GFR calc non Af Amer 26 (L) >60 mL/min   GFR calc Af Amer 30 (L) >60 mL/min   Anion gap 10 5 - 15    Comment: Performed at [facility], [address] [place], [place] [address]  Cancer antigen 15-3     Status: Abnormal   Collection Time: 01/18/20  9:35 AM  Result Value Ref Range   CA 15-3 48.5 (H) 0.0 - 25.0 U/mL    Comment: (NOTE) Roche [employer]ostics Electrochemiluminescence Immunoassay (ECLIA) Values obtained with different assay methods or kits cannot be used interchangeably.  Results cannot be interpreted as absolute evidence of the presence or absence of malignant disease. Performed At: [facility] [place], [place] [id number] Rush Farmer MD [id number]   CEA     Status: Abnormal   Collection Time: 01/18/20  9:35 AM  Result Value Ref Range   CEA 5.2 (H) 0.0 - 4.7 ng/mL    Comment: (NOTE)                             Nonsmokers          <3.9                              Smokers             <5.6 [employer]ostics Electrochemiluminescence Immunoassay (ECLIA) Values obtained with different assay methods or kits cannot be used interchangeably.  Results cannot be interpreted as absolute evidence of the presence or absence of malignant disease. Performed At: [facility] [place], [place] [id number] Rush Farmer MD [id number]   CBC with Differential     Status: Abnormal   Collection Time: 01/18/20  9:35 AM  Result Value Ref Range   WBC 5.9 4.0 - 10.5 K/uL   RBC 4.33 3.87 - 5.11 MIL/uL   Hemoglobin 13.6 12.0 - 15.0 g/dL   HCT 40.2 36 - 46 %   MCV 92.8 80.0 - 100.0 fL   MCH 31.4 26.0 - 34.0 pg   MCHC 33.8 30.0 - 36.0 g/dL   RDW 13.3 11.5 - 15.5 %   Platelets 167 150 - 400 K/uL   nRBC 0.0 0.0 - 0.2 %   Neutrophils Relative % 59 %   Neutro Abs 3.6 1.7 - 7.7 K/uL   Lymphocytes Relative 13 %   Lymphs Abs 0.8 0.7 - 4.0 K/uL   Monocytes Relative 14 %   Monocytes Absolute 0.8 0.1 - 1.0 K/uL   Eosinophils  Relative 11 %   Eosinophils Absolute 0.6 (H) 0.0 - 0.5 K/uL   Basophils Relative 2 %   Basophils Absolute 0.1 0.0 - 0.1 K/uL   Immature Granulocytes 1 %   Abs Immature Granulocytes 0.03 0.00 - 0.07 K/uL    Comment: Performed at [facility], [address] [place], [place] [address]  Comprehensive metabolic panel     Status: Abnormal   Collection Time: 01/18/20  9:35 AM  Result Value Ref Range   Sodium 139 135 - 145 mmol/L   Potassium 4.7 3.5 - 5.1 mmol/L   Chloride 103 98 - 111 mmol/L   CO2 26 22 - 32 mmol/L   Glucose, Bld 127 (H) 70 - 99 mg/dL    Comment: Glucose reference range applies only to samples taken after fasting for at least 8 hours.   BUN 47 (H) 8 - 23 mg/dL   Creatinine, Ser 1.83 (H) 0.44 - 1.00 mg/dL   Calcium 9.5 8.9 - 10.3 mg/dL   Total Protein 8.1 6.5 - 8.1 g/dL   Albumin 4.4 3.5 - 5.0 g/dL   AST 27 15 - 41 U/L   ALT 19 0 - 44 U/L   Alkaline Phosphatase 109 38 - 126 U/L   Total Bilirubin 0.7  0.3 - 1.2 mg/dL   GFR calc non Af Amer 24 (L) >60 mL/min   GFR calc Af Amer 28 (L) >60 mL/min   Anion gap 10 5 - 15    Comment: Performed at [facility], [address] [place], [place] [address]  Cancer antigen 27.29     Status: Abnormal   Collection Time: 01/18/20  9:35 AM  Result Value Ref Range   CA 27.29 65.1 (H) 0.0 - 38.6 U/mL    Comment: (NOTE) Siemens Centaur Immunochemiluminometric Methodology (ICMA) Values obtained with different assay methods or kits cannot be used interchangeably. Results cannot be interpreted as absolute evidence of the presence or absence of malignant disease. Performed At: [facility] [place], [place] [id number] Rush Farmer MD [id number]   Cancer antigen 15-3     Status: Abnormal   Collection Time: 02/26/20  9:54 AM  Result Value Ref Range   CA 15-3 38.3 (H) 0.0 - 25.0 U/mL    Comment: (NOTE) Roche [employer]ostics Electrochemiluminescence Immunoassay (ECLIA) Values obtained with different assay methods or kits cannot be used interchangeably.  Results cannot be interpreted as absolute evidence of the presence or absence of malignant disease. Performed At: [facility] [place], [place] [id number] Rush Farmer MD [id number]   Cancer antigen 27.29     Status: Abnormal   Collection Time: 02/26/20  9:54 AM  Result Value Ref Range   CA 27.29 48.2 (H) 0.0 - 38.6 U/mL    Comment: (NOTE) Siemens Centaur Immunochemiluminometric Methodology [facility]) Values obtained with different assay methods or kits cannot be used interchangeably. Results cannot be interpreted as absolute evidence of the presence or absence of malignant disease. Performed At: [facility] [place], [place] [id number] Rush Farmer MD [id number]   Comprehensive metabolic panel     Status: Abnormal   Collection Time: 02/26/20  9:54 AM  Result Value Ref Range   Sodium 142 135 - 145 mmol/L    Potassium 4.6 3.5 - 5.1 mmol/L   Chloride 108 98 - 111 mmol/L   CO2 26 22 - 32 mmol/L   Glucose, Bld 140 (H) 70 - 99 mg/dL    Comment: Glucose reference range  applies only to samples taken after fasting for at least 8 hours.   BUN 38 (H) 8 - 23 mg/dL   Creatinine, Ser 1.65 (H) 0.44 - 1.00 mg/dL   Calcium 9.7 8.9 - 10.3 mg/dL   Total Protein 7.8 6.5 - 8.1 g/dL   Albumin 4.5 3.5 - 5.0 g/dL   AST 29 15 - 41 U/L   ALT 18 0 - 44 U/L   Alkaline Phosphatase 91 38 - 126 U/L   Total Bilirubin 0.7 0.3 - 1.2 mg/dL   GFR, Estimated 27 (L) >60 mL/min   Anion gap 8 5 - 15    Comment: Performed at [facility], [address] [place], [place] [address]  CBC with Differential     Status: None   Collection Time: 02/26/20  9:54 AM  Result Value Ref Range   WBC 6.1 4.0 - 10.5 K/uL   RBC 4.13 3.87 - 5.11 MIL/uL   Hemoglobin 13.1 12.0 - 15.0 g/dL   HCT 38.8 36 - 46 %   MCV 93.9 80.0 - 100.0 fL   MCH 31.7 26.0 - 34.0 pg   MCHC 33.8 30.0 - 36.0 g/dL   RDW 13.8 11.5 - 15.5 %   Platelets 152 150 - 400 K/uL   nRBC 0.0 0.0 - 0.2 %   Neutrophils Relative % 61 %   Neutro Abs 3.7 1.7 - 7.7 K/uL   Lymphocytes Relative 18 %   Lymphs Abs 1.1 0.7 - 4.0 K/uL   Monocytes Relative 12 %   Monocytes Absolute 0.7 0.1 - 1.0 K/uL   Eosinophils Relative 7 %   Eosinophils Absolute 0.4 0.0 - 0.5 K/uL   Basophils Relative 1 %   Basophils Absolute 0.1 0.0 - 0.1 K/uL   Immature Granulocytes 1 %   Abs Immature Granulocytes 0.03 0.00 - 0.07 K/uL    Comment: Performed at [facility], [address] [place], [place] [address]  CEA     Status: Abnormal   Collection Time: 02/26/20  9:54 AM  Result Value Ref Range   CEA 5.7 (H) 0.0 - 4.7 ng/mL    Comment: (NOTE)                             Nonsmokers          <3.9                             Smokers             <5.6 Roche [employer]ostics Electrochemiluminescence Immunoassay (ECLIA) Values obtained with different assay methods or kits cannot be used  interchangeably.  Results cannot be interpreted as absolute evidence of the presence or absence of malignant disease. Performed At: [facility] [place], [place] [id number] Rush Farmer MD [id number]     Radiology No results found.  Assessment/Plan Pure hypercholesterolemia lipid control important in reducing the progression of atherosclerotic disease. Continue statin therapy  Renovascular hypertension Blood pressure control markedly improved after intervention  Atherosclerosis of renal artery (HCC) Renal artery duplex though shows a patent right renal artery stent without recurrent stenosis and no flow in the left renal artery which is stable.  She will continue her current medical regimen.  Given the solitary nature of her right renal intervention making this a much more high risk scenario, I would prefer to continue to follow this on 7-month  intervals rather than yearly.  She is agreeable.    Leotis Pain, MD  02/27/2020 1:23 PM    This note was created with Dragon medical transcription system.  Any errors from dictation are purely unintentional

## 2020-03-06 ENCOUNTER — Other Ambulatory Visit: Payer: Self-pay

## 2020-03-06 ENCOUNTER — Encounter: Payer: Self-pay | Admitting: Podiatry

## 2020-03-06 ENCOUNTER — Ambulatory Visit: Payer: Medicare PPO | Admitting: Podiatry

## 2020-03-06 ENCOUNTER — Ambulatory Visit (INDEPENDENT_AMBULATORY_CARE_PROVIDER_SITE_OTHER): Payer: Medicare PPO

## 2020-03-06 DIAGNOSIS — Q828 Other specified congenital malformations of skin: Secondary | ICD-10-CM | POA: Diagnosis not present

## 2020-03-06 DIAGNOSIS — M79676 Pain in unspecified toe(s): Secondary | ICD-10-CM

## 2020-03-06 DIAGNOSIS — M2041 Other hammer toe(s) (acquired), right foot: Secondary | ICD-10-CM | POA: Diagnosis not present

## 2020-03-06 DIAGNOSIS — B351 Tinea unguium: Secondary | ICD-10-CM

## 2020-03-06 DIAGNOSIS — M778 Other enthesopathies, not elsewhere classified: Secondary | ICD-10-CM | POA: Diagnosis not present

## 2020-03-06 NOTE — Progress Notes (Signed)
She presents today chief complaint of painful calluses and elongated nails.  [name] about her toe starting to roll under.  She states that they are not overly painful but she is just concerned that things are starting to change.  Relates that she has recently had a bilateral mastectomy with radiation secondary to breast cancer and she is now complete with the radiation.  Objective: Vital signs are stable she is alert oriented x3 pulses remain palpable.  Toenails are long thick yellow dystrophic-like mycotic reactive hyperkeratotic lesion plantar aspect of the right foot demonstrates no erythema cellulitis drainage or odor mild hammertoe deformities 2 through 5 of the right foot are noted but flexible and they do demonstrate rotation.  Radiographs taken today do demonstrate them osseously mature individual with no acute findings plate and screws for fusion of the first metatarsophalangeal joint of the right foot are intact and do not appear to be loosening but she does have adductovarus rotation of the toes with osteopenia.  No fractures identified.  Assessment: Pain in limb secondary to painful elongated nails and porokeratosis.  Hammertoe deformities are noted right.  Plan: Debridement of reactive hyperkeratotic tissue and debridement of toenails 1 through 5 bilaterally.

## 2020-03-07 ENCOUNTER — Ambulatory Visit
Admission: RE | Admit: 2020-03-07 | Discharge: 2020-03-07 | Disposition: A | Discharge status: Home / Self Care | Payer: Medicare PPO | Source: Ambulatory Visit | Attending: Internal Medicine | Admitting: Internal Medicine

## 2020-03-07 DIAGNOSIS — Z170 Estrogen receptor positive status [ER+]: Secondary | ICD-10-CM | POA: Diagnosis present

## 2020-03-07 DIAGNOSIS — C50211 Malignant neoplasm of upper-inner quadrant of right female breast: Secondary | ICD-10-CM | POA: Insufficient documentation

## 2020-03-07 DIAGNOSIS — Z79811 Long term (current) use of aromatase inhibitors: Secondary | ICD-10-CM | POA: Insufficient documentation

## 2020-03-28 ENCOUNTER — Inpatient Hospital Stay: Payer: Medicare PPO | Attending: Internal Medicine

## 2020-03-28 ENCOUNTER — Inpatient Hospital Stay: Payer: Medicare PPO | Admitting: Internal Medicine

## 2020-03-28 ENCOUNTER — Encounter: Payer: Self-pay | Admitting: Internal Medicine

## 2020-03-28 ENCOUNTER — Other Ambulatory Visit: Payer: Self-pay

## 2020-03-28 DIAGNOSIS — I129 Hypertensive chronic kidney disease with stage 1 through stage 4 chronic kidney disease, or unspecified chronic kidney disease: Secondary | ICD-10-CM | POA: Insufficient documentation

## 2020-03-28 DIAGNOSIS — Z170 Estrogen receptor positive status [ER+]: Secondary | ICD-10-CM | POA: Insufficient documentation

## 2020-03-28 DIAGNOSIS — R5381 Other malaise: Secondary | ICD-10-CM | POA: Diagnosis not present

## 2020-03-28 DIAGNOSIS — Z923 Personal history of irradiation: Secondary | ICD-10-CM | POA: Insufficient documentation

## 2020-03-28 DIAGNOSIS — Z79811 Long term (current) use of aromatase inhibitors: Secondary | ICD-10-CM | POA: Insufficient documentation

## 2020-03-28 DIAGNOSIS — N184 Chronic kidney disease, stage 4 (severe): Secondary | ICD-10-CM | POA: Diagnosis not present

## 2020-03-28 DIAGNOSIS — C50911 Malignant neoplasm of unspecified site of right female breast: Secondary | ICD-10-CM | POA: Diagnosis not present

## 2020-03-28 DIAGNOSIS — F32A Depression, unspecified: Secondary | ICD-10-CM | POA: Diagnosis not present

## 2020-03-28 DIAGNOSIS — M810 Age-related osteoporosis without current pathological fracture: Secondary | ICD-10-CM | POA: Diagnosis not present

## 2020-03-28 DIAGNOSIS — E7800 Pure hypercholesterolemia, unspecified: Secondary | ICD-10-CM | POA: Diagnosis not present

## 2020-03-28 DIAGNOSIS — M1990 Unspecified osteoarthritis, unspecified site: Secondary | ICD-10-CM | POA: Diagnosis not present

## 2020-03-28 DIAGNOSIS — C50211 Malignant neoplasm of upper-inner quadrant of right female breast: Secondary | ICD-10-CM

## 2020-03-28 DIAGNOSIS — R5383 Other fatigue: Secondary | ICD-10-CM | POA: Insufficient documentation

## 2020-03-28 DIAGNOSIS — Z79899 Other long term (current) drug therapy: Secondary | ICD-10-CM | POA: Diagnosis not present

## 2020-03-28 DIAGNOSIS — Z7982 Long term (current) use of aspirin: Secondary | ICD-10-CM | POA: Insufficient documentation

## 2020-03-28 DIAGNOSIS — Z9013 Acquired absence of bilateral breasts and nipples: Secondary | ICD-10-CM | POA: Diagnosis not present

## 2020-03-28 DIAGNOSIS — Z85828 Personal history of other malignant neoplasm of skin: Secondary | ICD-10-CM | POA: Diagnosis not present

## 2020-03-28 LAB — CBC
HCT: 35.5 % — ABNORMAL LOW (ref 36.0–46.0)
Hemoglobin: 11.9 g/dL — ABNORMAL LOW (ref 12.0–15.0)
MCH: 31.7 pg (ref 26.0–34.0)
MCHC: 33.5 g/dL (ref 30.0–36.0)
MCV: 94.7 fL (ref 80.0–100.0)
Platelets: 170 10*3/uL (ref 150–400)
RBC: 3.75 MIL/uL — ABNORMAL LOW (ref 3.87–5.11)
RDW: 14.2 % (ref 11.5–15.5)
WBC: 6 10*3/uL (ref 4.0–10.5)
nRBC: 0 % (ref 0.0–0.2)

## 2020-03-28 NOTE — Assessment & Plan Note (Addendum)
#  Synchronous bilateral breast cancer-status post bilateral mastectomies-   Right Breast-stage III ER/PR positive HER-2 NEG; & Left breast cancer stage II ER/PR Pos; Her 2 NEG. Currently on anastrozole. STABLE.   #Continue anastrozole.  Tolerating fairly well.  # Radiation dermatitis new- G-1- resolved  # CKD- Stage- IV- GFR 27 [Dr.Kolluru]; STABLE>    # Depressed: Patient is currently on Celexa;STABLE  # NOV 2021- Osteoporosis The BMD measured at Forearm Radius 33% is 0.548 g/cm2 with a T-scoreof -3.7.on ca+vit D. Discussed the potential risk factors for osteoporosis- age/gender/postmenopausal status/use of anti-estrogen treatments. Discussed multiple options including exercise/ calcium and vitamin D supplementation/ and also use of bisphosphonates. Discussed oral bisphosphonates versus parenteral bisphosphonate like Reclast/ RANK ligand inhibitors- desnosumab. Discussed the potential benefits and/side effects  Including but not limited to Osteonecrosis of jaw/ hypocalcemia.   # DISPOSITION: # follow up in 4 weeks- MD; labs; cbc/cmp/ca 27-29;ca 15-3;cea; PROLIA SQ [new]Dr.B   Cc;Dr.Baboff

## 2020-03-28 NOTE — Progress Notes (Signed)
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 Burnett] stage III T2N3 [19/24 LN]; ER/PR positive HER-2 negative.   # Left breast-invasive mammary carcinoma-status post mastectomy [id number]; stage I-ER/PR positive HER-2 negative  #November 23, 2019-start adjuvant radiation bilateral [finished August 31]; no chemo;   #September 22nd 2021- anastrozole ----------------------------------------------------------------------------------------   A. RIGHT BREAST, 3:00; ULTRASOUND-GUIDED BIOPSY:  - INVASIVE MAMMARY CARCINOMA WITH MICROPAPILLARY FEATURES; Size of invasive carcinoma: 76m in this sample  Histologic grade of invasive carcinoma: Grade 2 ; ER/PR > 905: her 2- NEG  A. RIGHT BREAST, 3:00; ULTRASOUND-GUIDED BIOPSY: - INVASIVE MAMMARY CARCINOMA WITH MICROPAPILLARY FEATURES. 185min this sample. Grade 2. Lymphovascular invasion: Present.   B. LYMPH NODE, RIGHT AXILLARY; ULTRASOUND-GUIDED NEEDLE CORE BIOPSY: - METASTATIC MAMMARY CARCINOMA, 6 MM IN THIS SAMPLE.   C. RIGHT BREAST, LATERAL CALCIFICATIONS; STEREOTACTIC BIOPSY: - DUCTAL CARCINOMA IN SITU, INTERMEDIATE GRADE, WITH CALCIFICATIONS. -------------------------------------------------------------   # CKD- Stage III-IV [Dr.Kolluru]  # SURVIVORSHIP:   # GENETICS:   DIAGNOSIS:  Right breast cancer stage III;  left breast cancer-stage I GOALS: Cure  CURRENT/MOST RECENT THERAPY : Anastrozole    Carcinoma of  upper-inner quadrant of right breast in female, estrogen receptor positive ([facility] 09/27/2019 Initial Diagnosis   Carcinoma of upper-inner quadrant of right breast in female, estrogen receptor positive ([facility]     HISTORY OF PRESENTING ILLNESS:   FaZOEI AMISON86.o.  female synchronous bilateral breast cancer [right stage III ER/PR positive;Her2 NEG; left stage I ER/PR positive; Her2- NEG] s/p adjuvant radiation; currently on Anastrazole is here for follow-up/review results of the bone density.  Patient denies any worsening bone pain or joint pains.  Appetite is fair.  No weight loss.  No worsening rash.  No worsening hot flashes.  Complains of fatigue.  Review of Systems  Constitutional: Positive for malaise/fatigue. Negative for chills, diaphoresis, fever and weight loss.  HENT: Negative for nosebleeds and sore throat.   Eyes: Negative for double vision.  Respiratory: Negative for cough, hemoptysis, sputum production, shortness of breath and wheezing.   Cardiovascular: Negative for chest pain, palpitations, orthopnea and leg swelling.  Gastrointestinal: Negative for abdominal pain, blood in stool, constipation, diarrhea, heartburn, melena, nausea and vomiting.  Genitourinary: Negative for dysuria, frequency and urgency.  Musculoskeletal: Positive for back pain and joint pain.  Neurological: Negative for dizziness, tingling, focal weakness, weakness and headaches.  Endo/Heme/Allergies: Does not bruise/bleed easily.  Psychiatric/Behavioral: Negative for depression. The patient is not nervous/anxious and does not have insomnia.      MEDICAL HISTORY:  Past Medical History:  Diagnosis Date  . Arthritis    Gout  . Chronic kidney disease   . Diarrhea   . Hypercholesteremia   . Hypertension   . Neuropathy    feet and lower legs  . Seasonal allergies   . Skin cancer    face    SURGICAL HISTORY: Past Surgical History:  Procedure Laterality Date  . ABDOMINAL HYSTERECTOMY  2000  .  APPENDECTOMY    . BREAST BIOPSY Right 09/19/2019   affirm bx of calcs UOQ,  x marker, path pending  . BREAST BIOPSY Right 09/19/2019   [place] bx of mass,heart marker, path pending  . BREAST BIOPSY Right 09/19/2019   [place] bx of LN, coil marker, path pending  . CATARACT EXTRACTION Left   . CATARACT EXTRACTION W/PHACO Right 10/24/2014   Procedure: CATARACT EXTRACTION PHACO AND INTRAOCULAR LENS PLACEMENT (IOC);  Surgeon: Leandrew Koyanagi, MD;  Location: [facility];  Service: Ophthalmology;  Laterality: Right;  . ESOPHAGOGASTRODUODENOSCOPY N/A 03/07/2018   Procedure: ESOPHAGOGASTRODUODENOSCOPY (EGD);  Surgeon: Lin Landsman, MD;  Location: [facility];  Service: Gastroenterology;  Laterality: N/A;  . MASTECTOMY MODIFIED RADICAL Right 10/13/2019   Procedure: MASTECTOMY MODIFIED RADICAL;  Surgeon: Robert Bellow, MD;  Location: [facility] ORS;  Service: General;  Laterality: Right;  . RENAL ANGIOGRAPHY Right 04/11/2018   Procedure: RENAL ANGIOGRAPHY;  Surgeon: Algernon Huxley, MD;  Location: [facility];  Service: Cardiovascular;  Laterality: Right;  . SIMPLE MASTECTOMY WITH AXILLARY SENTINEL NODE BIOPSY Left 10/13/2019   Procedure: SIMPLE MASTECTOMY TRUE CUT BIOPSY, SENTINEL NODE BIOPSY;  Surgeon: Robert Bellow, MD;  Location: [facility] ORS;  Service: General;  Laterality: Left;    SOCIAL HISTORY: Social History   Socioeconomic History  . Marital status: Widowed    Spouse name: Not on file  . Number of children: Not on file  . Years of education: Not on file  . Highest education level: Not on file  Occupational History  . Not on file  Tobacco Use  . Smoking status: Never Smoker  . Smokeless tobacco: Never Used  Substance and Sexual Activity  . Alcohol use: No  . Drug use: Never  . Sexual activity: Not on file  Other Topics Concern  . Not on file  Social History Narrative   2 daughters; lives in [place]; Pharmacist, 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 Walks  cane/walker.    Social Determinants of Health   Financial Resource Strain:   . Difficulty of Paying Living Expenses: Not on file  Food Insecurity:   . Worried About Charity fundraiser in the Last Year: Not on file  . Ran Out of Food in the Last Year: Not on file  Transportation Needs:   . Lack of Transportation (Medical): Not on file  . Lack of Transportation (Non-Medical): Not on file  Physical Activity:   . Days of Exercise per Week: Not on file  . Minutes of Exercise per Session: Not on file  Stress:   . Feeling of Stress : Not on file  Social Connections:   . Frequency of Communication with Friends and Family: Not on file  . Frequency of Social Gatherings with Friends and Family: Not on file  . Attends Religious Services: Not on file  . Active Member of Clubs or Organizations: Not on file  . Attends Archivist Meetings: Not on file  . Marital Status: Not on file  Intimate Partner Violence:   . Fear of Current or Ex-Partner: Not on file  . Emotionally Abused: Not on file  . Physically Abused: Not on file  . Sexually Abused: Not on file    FAMILY HISTORY: Family History  Problem Relation Age of Onset  . Hypertension Mother   . Hypertension Father   . Stroke Father   . Breast cancer Daughter 68    ALLERGIES:  is allergic to penicillins and drug ingredient [zinc].  MEDICATIONS:  Current Outpatient Medications  Medication Sig Dispense Refill  . allopurinol (ZYLOPRIM) 100 MG tablet     .  amLODipine (NORVASC) 5 MG tablet Take 2.5 mg by mouth daily.   1  . anastrozole (ARIMIDEX) 1 MG tablet Take 1 tablet (1 mg total) by mouth daily. DO NOT START if RASH is NOT better 30 tablet 3  . aspirin EC 81 MG tablet Take 81 mg by mouth daily.    . Baclofen 5 MG TABS Take 2.5 mg by mouth in the morning and at bedtime.     . Cholecalciferol (VITAMIN D PO) Take 1 tablet by mouth daily as needed (winter months).     . citalopram (CELEXA) 10 MG tablet Take 10 mg by mouth  daily.     . clopidogrel (PLAVIX) 75 MG tablet TAKE 1 TABLET BY MOUTH EVERY DAY 30 tablet 1  . docusate sodium (COLACE) 100 MG capsule Take 100 mg by mouth daily as needed for mild constipation.    . furosemide (LASIX) 20 MG tablet Take 20 mg by mouth daily.     Marland Kitchen gabapentin (NEURONTIN) 100 MG capsule Take 100 mg by mouth at bedtime.     Marland Kitchen HYDROcodone-acetaminophen (NORCO/VICODIN) 5-325 MG tablet Take 0.5-1 tablets by mouth every 4 (four) hours as needed for moderate pain. 12 tablet 0  . loratadine (CLARITIN) 10 MG tablet Take 10 mg by mouth daily as needed for allergies. AM    . metoprolol succinate (TOPROL-XL) 25 MG 24 hr tablet Take 37.5 mg by mouth at bedtime.     . Multiple Vitamins-Minerals (ICAPS PO) Take 1 capsule by mouth in the morning and at bedtime.     . polyethylene glycol (MIRALAX / GLYCOLAX) packet Take 17 g by mouth daily as needed. 60 each 0  . simvastatin (ZOCOR) 20 MG tablet Take 20 mg by mouth every evening. PM     . triamcinolone ointment (KENALOG) 0.5 % Apply 1 application topically daily. Apply to rash. 30 g 0  . DULoxetine (CYMBALTA) 60 MG capsule Take 60 mg by mouth daily.     . hydrALAZINE (APRESOLINE) 50 MG tablet Take 1 tablet (50 mg total) by mouth 2 (two) times daily. 30 tablet 0  . lidocaine-prilocaine (EMLA) cream Apply 1 application topically as needed (to breast). (Patient not taking: Reported on 03/28/2020)     No current facility-administered medications for this visit.      Marland Kitchen  PHYSICAL EXAMINATION: ECOG PERFORMANCE STATUS: 0 - Asymptomatic  Vitals:   03/28/20 1107  BP: (!) 148/71  Pulse: 72  Resp: 16  Temp: 98.3 F (36.8 C)  SpO2: 97%   Filed Weights   03/28/20 1107  Weight: 145 lb 6 oz (65.9 kg)    Physical Exam Constitutional:      Comments: Walks with a cane.with daughter.   HENT:     Head: Normocephalic and atraumatic.     Mouth/Throat:     Pharynx: No oropharyngeal exudate.  Eyes:     Pupils: Pupils are equal, round, and  reactive to light.  Cardiovascular:     Rate and Rhythm: Normal rate and regular rhythm.  Pulmonary:     Effort: Pulmonary effort is normal. No respiratory distress.     Breath sounds: Normal breath sounds. No wheezing.  Abdominal:     General: Bowel sounds are normal. There is no distension.     Palpations: Abdomen is soft. There is no mass.     Tenderness: There is no abdominal tenderness. There is no guarding or rebound.  Musculoskeletal:        General: No tenderness. Normal range of  motion.     Cervical back: Normal range of motion and neck supple.  Skin:    General: Skin is warm.     Comments: See images below   Neurological:     Mental Status: She is alert and oriented to person, place, and time.  Psychiatric:        Mood and Affect: Affect normal.    LABORATORY DATA:  I have reviewed the data as listed Lab Results  Component Value Date   WBC 6.0 03/28/2020   HGB 11.9 (L) 03/28/2020   HCT 35.5 (L) 03/28/2020   MCV 94.7 03/28/2020   PLT 170 03/28/2020   Recent Labs    11/17/19 1014 11/17/19 1014 12/20/19 0910 01/18/20 0935 02/26/20 0954  NA 142   < > 140 139 142  K 4.6   < > 4.9 4.7 4.6  CL 105   < > 105 103 108  CO2 27   < > _0 GLUCOSE 139*   < > 123* 127* 140*  BUN 48*   < > 38* 47* 38*  CREATININE 1.88*   < > 1.72* 1.83* 1.65*  CALCIUM 9.5   < > 9.4 9.5 9.7  GFRNONAA 23*   < > 26* 24* 27*  GFRAA 27*  --  30* 28*  --   PROT 7.4   < > 7.4 8.1 7.8  ALBUMIN 4.1   < > 4.3 4.4 4.5  AST 27   < > _1 ALT 16   < > _2 ALKPHOS 105   < > 110 109 91  BILITOT 0.7   < > 0.6 0.7 0.7   < > = values in this interval not displayed.          RADIOGRAPHIC STUDIES: I have personally reviewed the radiological images as listed and agreed with the findings in the report. DG Bone Density  Result Date: 03/07/2020 EXAM: DUAL X-RAY ABSORPTIOMETRY (DXA) FOR BONE MINERAL DENSITY IMPRESSION: Your patient Reilly Molchan completed a BMD test on 03/07/2020  using the [employer] (software version: 14.10) manufactured by [employer]. The following summarizes the results of our evaluation. Technologist: [facility] PATIENT BIOGRAPHICAL: Name: Amayrany, Cafaro Patient ID: [id number] Birth Date: [birth year] Height: 63.0 in. Gender: Female Exam Date: 03/07/2020 Weight: 145.0 lbs. Indications: Advanced Age, Caucasian, History of Breast Cancer, History of Radiation, Hysterectomy, Oophorectomy Bilateral, Postmenopausal Fractures: Right ankle Treatments: Anastrozole, Calcium, Vitamin D DENSITOMETRY RESULTS: Site         Region      Measured Date Measured Age WHO Classification Young Adult T-score BMD         %Change vs. Previous Significant Change (*) DualFemur Total Right 03/07/2020 90.8 Osteoporosis -2.9 0.645 g/cm2 Left Forearm Radius 33% 03/07/2020 90.8 Osteoporosis -3.7 0.548 g/cm2 ASSESSMENT: The BMD measured at Forearm Radius 33% is 0.548 g/cm2 with a T-score of -3.7. This patient is considered osteoporotic according to [facility]) criteria. The scan quality is good. Lumbar spine was not utilized due to advanced degenerative changes. World Pharmacologist [facility]) criteria for post-menopausal, Caucasian Women: Normal:                   T-score at or above -1 SD Osteopenia/low bone mass: T-score between -1 and -2.5 SD Osteoporosis:             T-score at or below -2.5 SD RECOMMENDATIONS: 1. All patients should optimize calcium and vitamin  D intake. 2. Consider FDA-approved medical therapies in postmenopausal women and men aged 35 years and older, based on the following: a. A hip or vertebral(clinical or morphometric) fracture b. T-score < -2.5 at the femoral neck or spine after appropriate evaluation to exclude secondary causes c. Low bone mass (T-score between -1.0 and -2.5 at the femoral neck or spine) and a 10-year probability of a hip fracture > 3% or a 10-year probability of a major osteoporosis-related fracture > 20% based on the  [place]-adapted WHO algorithm 3. Clinician judgment and/or patient preferences may indicate treatment for people with 10-year fracture probabilities above or below these levels FOLLOW-UP: People with diagnosed cases of osteoporosis or at high risk for fracture should have regular bone mineral density tests. For patients eligible for Medicare, routine testing is allowed once every 2 years. The testing frequency can be increased to one year for patients who have rapidly progressing disease, those who are receiving or discontinuing medical therapy to restore bone mass, or have additional risk factors. I have reviewed this report, and agree with the above findings. [facility] Electronically Signed   By: Lowella Grip III M.D.   On: 03/07/2020 15:49   DG Foot Complete Right  Result Date: 03/06/2020 Please see detailed radiograph report in office note.   ASSESSMENT & PLAN:   Carcinoma of upper-inner quadrant of right breast in female, estrogen receptor positive ([place]) #Synchronous bilateral breast cancer-status post bilateral mastectomies-   Right Breast-stage III ER/PR positive HER-2 NEG; & Left breast cancer stage II ER/PR Pos; Her 2 NEG. Currently on anastrozole. STABLE.   #Continue anastrozole.  Tolerating fairly well.  # Radiation dermatitis new- G-1- resolved  # CKD- Stage- IV- GFR 27 [Dr.Kolluru]; STABLE>    # Depressed: Patient is currently on Celexa;STABLE  # NOV 2021- Osteoporosis The BMD measured at Forearm Radius 33% is 0.548 g/cm2 with a T-scoreof -3.7.on ca+vit D. Discussed the potential risk factors for osteoporosis- age/gender/postmenopausal status/use of anti-estrogen treatments. Discussed multiple options including exercise/ calcium and vitamin D supplementation/ and also use of bisphosphonates. Discussed oral bisphosphonates versus parenteral bisphosphonate like Reclast/ RANK ligand inhibitors- desnosumab. Discussed the potential benefits and/side effects  Including  but not limited to Osteonecrosis of jaw/ hypocalcemia.   # DISPOSITION: # follow up in 4 weeks- MD; labs; cbc/cmp/ca 27-29;ca 15-3;cea; PROLIA SQ [new]Dr.B   Cc;Dr.Baboff   All questions were answered. The patient/family knows to call the clinic with any problems, questions or concerns.     Cammie Sickle, MD 03/31/2020 4:40 PM

## 2020-03-28 NOTE — Progress Notes (Signed)
Survivorship Care Plan visit completed.  Treatment summary reviewed and given to patient.  ASCO answers booklet reviewed and given to patient.  CARE program and Cancer Transitions discussed with patient along with other resources cancer center offers to patients and caregivers.  Patient verbalized understanding.  Patient declined for APP to have a Virtual visit to introduce them to the Survivorship Clinic.  Encouraged patient to call for any questions or concerns. 

## 2020-04-01 ENCOUNTER — Telehealth: Payer: Self-pay | Admitting: Internal Medicine

## 2020-04-01 NOTE — Telephone Encounter (Signed)
On 11/22- spoke to patient daughter [name] regarding patient's current plan of care.  Discussed regarding use of Prolia for osteoporosis.  In agreement.

## 2020-04-10 ENCOUNTER — Inpatient Hospital Stay: Payer: Medicare PPO | Attending: Oncology | Admitting: Occupational Therapy

## 2020-04-10 DIAGNOSIS — M1990 Unspecified osteoarthritis, unspecified site: Secondary | ICD-10-CM | POA: Insufficient documentation

## 2020-04-10 DIAGNOSIS — C50211 Malignant neoplasm of upper-inner quadrant of right female breast: Secondary | ICD-10-CM | POA: Insufficient documentation

## 2020-04-10 DIAGNOSIS — Z85828 Personal history of other malignant neoplasm of skin: Secondary | ICD-10-CM | POA: Insufficient documentation

## 2020-04-10 DIAGNOSIS — Z9013 Acquired absence of bilateral breasts and nipples: Secondary | ICD-10-CM | POA: Insufficient documentation

## 2020-04-10 DIAGNOSIS — E7800 Pure hypercholesterolemia, unspecified: Secondary | ICD-10-CM | POA: Insufficient documentation

## 2020-04-10 DIAGNOSIS — Z170 Estrogen receptor positive status [ER+]: Secondary | ICD-10-CM | POA: Insufficient documentation

## 2020-04-10 DIAGNOSIS — M25611 Stiffness of right shoulder, not elsewhere classified: Secondary | ICD-10-CM

## 2020-04-10 DIAGNOSIS — Z79811 Long term (current) use of aromatase inhibitors: Secondary | ICD-10-CM | POA: Insufficient documentation

## 2020-04-10 DIAGNOSIS — M25612 Stiffness of left shoulder, not elsewhere classified: Secondary | ICD-10-CM

## 2020-04-10 DIAGNOSIS — I129 Hypertensive chronic kidney disease with stage 1 through stage 4 chronic kidney disease, or unspecified chronic kidney disease: Secondary | ICD-10-CM | POA: Insufficient documentation

## 2020-04-10 DIAGNOSIS — M810 Age-related osteoporosis without current pathological fracture: Secondary | ICD-10-CM | POA: Insufficient documentation

## 2020-04-10 DIAGNOSIS — Z7982 Long term (current) use of aspirin: Secondary | ICD-10-CM | POA: Insufficient documentation

## 2020-04-10 DIAGNOSIS — R5381 Other malaise: Secondary | ICD-10-CM | POA: Insufficient documentation

## 2020-04-10 DIAGNOSIS — F32A Depression, unspecified: Secondary | ICD-10-CM | POA: Insufficient documentation

## 2020-04-10 DIAGNOSIS — Z79899 Other long term (current) drug therapy: Secondary | ICD-10-CM | POA: Insufficient documentation

## 2020-04-10 DIAGNOSIS — R5383 Other fatigue: Secondary | ICD-10-CM | POA: Insufficient documentation

## 2020-04-10 NOTE — Therapy (Signed)
[facility] [address], [address] [place], [place], [address] Phone: [phone]   Fax:  [phone]  Occupational Therapy Screen  Patient Details  Name: Tina Curtis MRN: [id number] Date of Birth: 05/01/1927 No data recorded  Encounter Date: 04/10/2020   OT End of Session - 04/10/20 1230    Visit Number 0           Past Medical History:  Diagnosis Date  . Arthritis    Gout  . Chronic kidney disease   . Diarrhea   . Hypercholesteremia   . Hypertension   . Neuropathy    feet and lower legs  . Seasonal allergies   . Skin cancer    face    Past Surgical History:  Procedure Laterality Date  . ABDOMINAL HYSTERECTOMY  2000  . APPENDECTOMY    . BREAST BIOPSY Right 09/19/2019   affirm bx of calcs UOQ, x marker, path pending  . BREAST BIOPSY Right 09/19/2019   us bx of mass,heart marker, path pending  . BREAST BIOPSY Right 09/19/2019   us bx of LN, coil marker, path pending  . CATARACT EXTRACTION Left   . CATARACT EXTRACTION W/PHACO Right 10/24/2014   Procedure: CATARACT EXTRACTION PHACO AND INTRAOCULAR LENS PLACEMENT (IOC);  Surgeon: Chadwick Brasington, MD;  Location: [facility];  Service: Ophthalmology;  Laterality: Right;  . ESOPHAGOGASTRODUODENOSCOPY N/A 03/07/2018   Procedure: ESOPHAGOGASTRODUODENOSCOPY (EGD);  Surgeon: Vanga, Rohini Reddy, MD;  Location: [facility] ENDOSCOPY;  Service: Gastroenterology;  Laterality: N/A;  . MASTECTOMY MODIFIED RADICAL Right 10/13/2019   Procedure: MASTECTOMY MODIFIED RADICAL;  Surgeon: Byrnett, Jeffrey W, MD;  Location: [facility];  Service: General;  Laterality: Right;  . RENAL ANGIOGRAPHY Right 04/11/2018   Procedure: RENAL ANGIOGRAPHY;  Surgeon: Dew, Jason S, MD;  Location: [facility] INVASIVE CV LAB;  Service: Cardiovascular;  Laterality: Right;  . SIMPLE MASTECTOMY WITH AXILLARY SENTINEL NODE BIOPSY Left 10/13/2019   Procedure: SIMPLE MASTECTOMY TRUE CUT BIOPSY, SENTINEL NODE  BIOPSY;  Surgeon: Byrnett, Jeffrey W, MD;  Location: [facility] ORS;  Service: General;  Laterality: Left;    There were no vitals filed for this visit.   Subjective Assessment - 04/10/20 1147    Subjective  My shoulders and neck is stiff and some pain - the R worse than the L - my chest and under arm is tight when trying to reach over head- and this bra with prosthesis rides up all the time- do not like it    Currently in Pain? Yes    Pain Score 5     Pain Location Shoulder   R under arm, chest   Pain Orientation Right    Pain Descriptors / Indicators Aching;Tightness;Discomfort    Pain Type Surgical pain    Pain Onset More than a month ago    Pain Frequency Intermittent               LYMPHEDEMA/ONCOLOGY QUESTIONNAIRE - 04/10/20 0001      Right Upper Extremity Lymphedema   15 cm Proximal to Olecranon Process 27.5 cm    10 cm Proximal to Olecranon Process 26.4 cm    Olecranon Process 22.8 cm    15 cm Proximal to Ulnar Styloid Process 21 cm    10 cm Proximal to Ulnar Styloid Process 17 cm    Just Proximal to Ulnar Styloid Process 14 cm    Across Hand at Thumb Web Space 17.5 cm      Left Upper Extremity Lymphedema     15 cm Proximal to Olecranon Process 25.5 cm    10 cm Proximal to Olecranon Process 24 cm    Olecranon Process 22.8 cm    15 cm Proximal to Ulnar Styloid Process 20 cm    10 cm Proximal to Ulnar Styloid Process 16.8 cm    Just Proximal to Ulnar Styloid Process 14.4 cm    Across Hand at Thumb Web Space 17 cm             LAST NOTE: 2. Brahmanday, Govinda R, MD (Physician) at 03/28/2020 11:41 AM - Written      #Synchronous bilateral breast cancer-status post bilateral mastectomies-   Right Breast-stage III ER/PR positive HER-2 NEG; & Left breast cancer stage II ER/PR Pos; Her 2 NEG. Currently on anastrozole. STABLE.   #Continue anastrozole.  Tolerating fairly well.  # Radiation dermatitis new- G-1- resolved  # CKD- Stage- IV- GFR 27 [Dr.Kolluru]; STABLE>      # Depressed: Patient is currently on Celexa;STABLE  # NOV 2021- Osteoporosis The BMD measured at Forearm Radius 33% is 0.548 g/cm2 with a T-scoreof -3.7.on ca+vit D. Discussed the potential risk factors for osteoporosis- age/gender/postmenopausal status/use of anti-estrogen treatments. Discussed multiple options including exercise/ calcium and vitamin D supplementation/ and also use of bisphosphonates. Discussed oral bisphosphonates versus parenteral bisphosphonate like Reclast/ RANK ligand inhibitors- desnosumab. Discussed the potential benefits and/side effects  Including but not limited to Osteonecrosis of jaw/ hypocalcemia.        OT SCREEN 04/10/2020: Pt with bilateral breaste CA with mastectomy - per pt 26 ln removed R ,L 1 or 2 ln removed  Radiation and now on Anastrozole since Sept  She lives at [place] and walk 1/2 mile to mile every day -use cane or walker since she fell about 2 1/2 yrs ago Did not had any falls since then Retired teacher  Report some pain and discomfort bilateral shoulders and upper traps- R worse than L - pain increase to 5/10 on the R  Decrease end range for flexion , ext and stiff over pect with ext rotation  Pt was refer for OT screen by survivorship care - pt at risk for lymphedema R worse than L. Pt do show increase circumference in R upper arm by 2.4 cm - more than dominancy will allow - will reassess in 2 wks   Pt was ed on HEP for AAROM to bilateral shoulder -on wall for flexion and ABD - 10 reps - 1 -2 x day - slight pull under axilla and arm - no in shoulder  Stop when 1-2/10 stretch  Scapula retraction - 4 x day 10 reps  Handout provided on lymphedema - info , sign, symptoms and precautions- pt to read and ask if questions next time -and will review some more with pt - pt was late for appt- could not get car started  Will follow up in 2 wks again                                   Visit Diagnosis: Stiffness of  right shoulder, not elsewhere classified  Stiffness of left shoulder, not elsewhere classified    Problem List Patient Active Problem List   Diagnosis Date Noted  . Osteoporosis of forearm 03/28/2020  . Breast cancer (HCC) 10/13/2019  . Carcinoma of upper-inner quadrant of right breast in female, estrogen receptor positive (HCC) 09/27/2019  . Anemia in chronic kidney disease   02/01/2019  . Atherosclerosis of renal artery (HCC) 02/01/2019  . Benign hypertensive kidney disease with chronic kidney disease 02/01/2019  . Hyposmolality and/or hyponatremia 02/01/2019  . Proteinuria 02/01/2019  . Secondary hyperparathyroidism of renal origin (HCC) 02/01/2019  . Chronic kidney disease, stage IV (severe) (HCC) 04/01/2018  . 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 Renal artery stenosis (HCC) 04/01/2018  . Nausea & vomiting 03/05/2018  . AKI (acute kidney injury) (HCC) 02/17/2018  . Acquired hammer toe of right foot 08/14/2014  . Bunion of right foot 08/14/2014  . Gout 08/14/2014  . History of Clostridium difficile colitis 08/14/2014  . Pure hypercholesterolemia 08/14/2014  . RLS (restless legs syndrome) 08/14/2014  . Hallux rigidus of right foot 04/24/2014  . Primary osteoarthritis of foot 09/01/2013  . Fatigue 09/30/2012  . Gluteus medius or minimus syndrome 08/23/2012  . Trochanteric bursitis 08/12/2012  . Benign essential hypertension 09/03/2011  . Diverticulosis of colon 09/03/2011  . Osteopenia 09/03/2011  . Enthesopathy of ankle and tarsus 04/13/2011  . Osteoarthritis of spine with radiculopathy, lumbosacral region 09/30/2010  . Allergic rhinitis 10/31/1926    ,  OTR/L,CLT 04/10/2020, 12:31 PM  [facility] [address], [address] [place], [place], [address] Phone: [phone]   Fax:  [phone]  Name: Tina Curtis MRN: [id number] Date of Birth: 09/28/1926 

## 2020-04-24 ENCOUNTER — Inpatient Hospital Stay: Payer: Medicare PPO | Admitting: Occupational Therapy

## 2020-04-24 ENCOUNTER — Other Ambulatory Visit: Payer: Self-pay

## 2020-04-24 DIAGNOSIS — M25612 Stiffness of left shoulder, not elsewhere classified: Secondary | ICD-10-CM

## 2020-04-24 DIAGNOSIS — M25611 Stiffness of right shoulder, not elsewhere classified: Secondary | ICD-10-CM

## 2020-04-24 NOTE — Therapy (Signed)
[facility] [address] [place], [place], [place], [address] Phone: [phone]   Fax:  [phone]  Occupational Therapy Treatment  Patient Details  Name: Tina Curtis MRN: [id number] Date of Birth: Dec 15, 1926 No data recorded  Encounter Date: 04/24/2020   OT End of Session - 04/24/20 1153    Visit Number 0           Past Medical History:  Diagnosis Date  . Arthritis    Gout  . Chronic kidney disease   . Diarrhea   . Hypercholesteremia   . Hypertension   . Neuropathy    feet and lower legs  . Seasonal allergies   . Skin cancer    face    Past Surgical History:  Procedure Laterality Date  . ABDOMINAL HYSTERECTOMY  2000  . APPENDECTOMY    . BREAST BIOPSY Right 09/19/2019   affirm bx of calcs UOQ, x marker, path pending  . BREAST BIOPSY Right 09/19/2019   [place] bx of mass,heart marker, path pending  . BREAST BIOPSY Right 09/19/2019   [place] bx of LN, coil marker, path pending  . CATARACT EXTRACTION Left   . CATARACT EXTRACTION W/PHACO Right 10/24/2014   Procedure: CATARACT EXTRACTION PHACO AND INTRAOCULAR LENS PLACEMENT (IOC);  Surgeon: Leandrew Koyanagi, MD;  Location: [facility];  Service: Ophthalmology;  Laterality: Right;  . ESOPHAGOGASTRODUODENOSCOPY N/A 03/07/2018   Procedure: ESOPHAGOGASTRODUODENOSCOPY (EGD);  Surgeon: Lin Landsman, MD;  Location: [facility] ENDOSCOPY;  Service: Gastroenterology;  Laterality: N/A;  . MASTECTOMY MODIFIED RADICAL Right 10/13/2019   Procedure: MASTECTOMY MODIFIED RADICAL;  Surgeon: Robert Bellow, MD;  Location: [facility];  Service: General;  Laterality: Right;  . RENAL ANGIOGRAPHY Right 04/11/2018   Procedure: RENAL ANGIOGRAPHY;  Surgeon: Algernon Huxley, MD;  Location: [facility];  Service: Cardiovascular;  Laterality: Right;  . SIMPLE MASTECTOMY WITH AXILLARY SENTINEL NODE BIOPSY Left 10/13/2019   Procedure: SIMPLE MASTECTOMY TRUE CUT BIOPSY, SENTINEL NODE  BIOPSY;  Surgeon: Robert Bellow, MD;  Location: [facility];  Service: General;  Laterality: Left;    LAST NOTE: 2. Cammie Sickle, MD (Physician) at 03/28/2020 11:41 AM - Written      #Synchronous bilateral breast cancer-status post bilateral mastectomies- Right Breast-stage III ER/PR positive HER-2 NEG; &Left breast cancer stage II ER/PR Pos; Her 2 NEG. Currently on anastrozole. STABLE.  #Continue anastrozole. Tolerating fairly well.  # Radiation dermatitis new- G-1- resolved  # CKD- Stage- IV- GFR 27 [Dr.Kolluru];STABLE>  # Depressed: Patient is currently on Celexa;STABLE  #NOV 2021-Osteoporosis The BMD measured at Forearm Radius 33% is 0.548 g/cm2 with a T-scoreof -3.7.on ca+vit D.Discussed the potential risk factors for osteoporosis- age/gender/postmenopausal status/use of anti-estrogen treatments. Discussed multiple options including exercise/ calcium and vitamin D supplementation/ and also use of bisphosphonates. Discussed oral bisphosphonates versus parenteral bisphosphonate like Reclast/ RANK ligand inhibitors- desnosumab. Discussed the potential benefits and/side effects Including but not limited to Osteonecrosis of jaw/ hypocalcemia.        OT SCREEN 04/10/2020: Pt with bilateral breaste CA with mastectomy - per pt 26 ln removed R ,L 1 or 2 ln removed  Radiation and now on Anastrozole since Sept  She lives at [facility] and walk 1/2 mile to mile every day -use cane or walker since she fell about 2 1/2 yrs ago Did not had any falls since then Retired Pharmacist, hospital  Report some pain and discomfort bilateral shoulders and upper traps- R worse than L -  pain increase to 5/10 on the R  Decrease end range for flexion , ext and stiff over pect with ext rotation  Pt was refer for OT screen by survivorship care - pt at risk for lymphedema R worse than L. Pt do show increase circumference in R upper arm by 2.4 cm - more than dominancy will allow - will  reassess in 2 wks   Pt was ed on HEP for AAROM to bilateral shoulder -on wall for flexion and ABD - 10 reps - 1 -2 x day - slight pull under axilla and arm - no in shoulder  Stop when 1-2/10 stretch  Scapula retraction - 4 x day 10 reps  Handout provided on lymphedema - info , sign, symptoms and precautions- pt to read and ask if questions next time -and will review some more with pt - pt was late for appt- could not get car started  Will follow up in 2 wks again          Subjective Assessment - 04/24/20 1151    Subjective  I don't know if I done my exercises correct - I am sore in my neck, upper back -and my chest feels tight- did get new bra's - doing little better with that    Currently in Pain? Yes    Pain Score 3     Pain Location --   upper back and neck   Pain Descriptors / Indicators Sore    Pain Type Chronic pain               LYMPHEDEMA/ONCOLOGY QUESTIONNAIRE - 04/24/20 0001      Right Upper Extremity Lymphedema   15 cm Proximal to Olecranon Process 28 cm    10 cm Proximal to Olecranon Process 26.5 cm    Olecranon Process 22.5 cm           OT Screen 04/24/2020: Pt with bilateral breast CA with mastectomy - per pt 26 ln removed R ,L 1 or 2 ln removed  Radiation and now on Anastrozole since Sept  She lives at [facility] and walk 1/2 mile to mile every day -use cane or walker since she fell about 2 1/2 yrs ago Did not had any falls since then Retired Pharmacist, hospital  Report some more pain over neck and upper back - 3/10  Has history of arthritis  Some pain but more stiffness under arms with  Decrease AROM for shoulder ABD and external rotaion - flexion improve this date   Pt was refer for OT screen by survivorship care - pt at risk for lymphedema R worse than L. Pt do show increase circumference in R upper arm still but will monitor - she is R hand dominant - will reassess in 3 wks  Review AAROM HEP with pt again - and change to decrease sets during day and  not push or force her HEP  AAROM to bilateral shoulder -on wall for flexion and ABD - 10 reps - 1 -2 x day - slight pull under axilla and arm - not in shoulder  Stop when 1-2/10 stretch  Scapula retraction - 4 x day 10 reps  Gentle pect stretch on wall - 10 reps hold 5 sec - prior to ABD                                 Patient will benefit from skilled therapeutic intervention in order to  improve the following deficits and impairments:           Visit Diagnosis: Stiffness of right shoulder, not elsewhere classified  Stiffness of left shoulder, not elsewhere classified    Problem List Patient Active Problem List   Diagnosis Date Noted  . Osteoporosis of forearm 03/28/2020  . Breast cancer ([place]) 10/13/2019  . Carcinoma of upper-inner quadrant of right breast in female, estrogen receptor positive ([place]) 09/27/2019  . Anemia in chronic kidney disease 02/01/2019  . Atherosclerosis of renal artery ([place]) 02/01/2019  . Benign hypertensive kidney disease with chronic kidney disease 02/01/2019  . Hyposmolality and/or hyponatremia 02/01/2019  . Proteinuria 02/01/2019  . Secondary hyperparathyroidism of renal origin ([place]) 02/01/2019  . Chronic kidney disease, stage IV (severe) ([place]) 04/01/2018  . 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 Renal artery stenosis ([place]) 04/01/2018  . Nausea & vomiting 03/05/2018  . AKI (acute kidney injury) ([place]) 02/17/2018  . Acquired hammer toe of right foot 08/14/2014  . Bunion of right foot 08/14/2014  . Gout 08/14/2014  . History of Clostridium difficile colitis 08/14/2014  . Pure hypercholesterolemia 08/14/2014  . RLS (restless legs syndrome) 08/14/2014  . Hallux rigidus of right foot 04/24/2014  . Primary osteoarthritis of foot 09/01/2013  . Fatigue 09/30/2012  . Gluteus medius or minimus syndrome 08/23/2012  . Trochanteric bursitis 08/12/2012  . Benign essential hypertension 09/03/2011  . Diverticulosis of colon  09/03/2011  . Osteopenia 09/03/2011  . Enthesopathy of ankle and tarsus 04/13/2011  . Osteoarthritis of spine with radiculopathy, lumbosacral region 09/30/2010  . Allergic rhinitis 10[birth year]1928    Rosalyn Gess OTR/l,CLT 04/24/2020, 11:55 AM  [facility] [address] [place], [place], [place], [address] Phone: [phone]   Fax:  [phone]  Name: Tina Curtis MRN: [id number] Date of Birth: 03/03/1927

## 2020-04-26 ENCOUNTER — Inpatient Hospital Stay: Payer: Medicare PPO

## 2020-04-26 ENCOUNTER — Inpatient Hospital Stay: Payer: Medicare PPO | Admitting: Internal Medicine

## 2020-04-26 ENCOUNTER — Other Ambulatory Visit: Payer: Self-pay

## 2020-04-26 DIAGNOSIS — Z170 Estrogen receptor positive status [ER+]: Secondary | ICD-10-CM

## 2020-04-26 DIAGNOSIS — F32A Depression, unspecified: Secondary | ICD-10-CM | POA: Diagnosis not present

## 2020-04-26 DIAGNOSIS — Z79811 Long term (current) use of aromatase inhibitors: Secondary | ICD-10-CM | POA: Diagnosis not present

## 2020-04-26 DIAGNOSIS — M810 Age-related osteoporosis without current pathological fracture: Secondary | ICD-10-CM

## 2020-04-26 DIAGNOSIS — C50211 Malignant neoplasm of upper-inner quadrant of right female breast: Secondary | ICD-10-CM

## 2020-04-26 DIAGNOSIS — Z7982 Long term (current) use of aspirin: Secondary | ICD-10-CM | POA: Diagnosis not present

## 2020-04-26 DIAGNOSIS — Z9013 Acquired absence of bilateral breasts and nipples: Secondary | ICD-10-CM | POA: Diagnosis not present

## 2020-04-26 DIAGNOSIS — R5383 Other fatigue: Secondary | ICD-10-CM | POA: Diagnosis not present

## 2020-04-26 DIAGNOSIS — M1990 Unspecified osteoarthritis, unspecified site: Secondary | ICD-10-CM | POA: Diagnosis not present

## 2020-04-26 DIAGNOSIS — R5381 Other malaise: Secondary | ICD-10-CM | POA: Diagnosis not present

## 2020-04-26 DIAGNOSIS — E7800 Pure hypercholesterolemia, unspecified: Secondary | ICD-10-CM | POA: Diagnosis not present

## 2020-04-26 DIAGNOSIS — Z85828 Personal history of other malignant neoplasm of skin: Secondary | ICD-10-CM | POA: Diagnosis not present

## 2020-04-26 DIAGNOSIS — Z79899 Other long term (current) drug therapy: Secondary | ICD-10-CM | POA: Diagnosis not present

## 2020-04-26 DIAGNOSIS — I129 Hypertensive chronic kidney disease with stage 1 through stage 4 chronic kidney disease, or unspecified chronic kidney disease: Secondary | ICD-10-CM | POA: Diagnosis not present

## 2020-04-26 LAB — COMPREHENSIVE METABOLIC PANEL
ALT: 17 U/L (ref 0–44)
AST: 25 U/L (ref 15–41)
Albumin: 4.2 g/dL (ref 3.5–5.0)
Alkaline Phosphatase: 93 U/L (ref 38–126)
Anion gap: 11 (ref 5–15)
BUN: 40 mg/dL — ABNORMAL HIGH (ref 8–23)
CO2: 24 mmol/L (ref 22–32)
Calcium: 9.4 mg/dL (ref 8.9–10.3)
Chloride: 104 mmol/L (ref 98–111)
Creatinine, Ser: 1.5 mg/dL — ABNORMAL HIGH (ref 0.44–1.00)
GFR, Estimated: 33 mL/min — ABNORMAL LOW (ref >60)
Glucose, Bld: 157 mg/dL — ABNORMAL HIGH (ref 70–99)
Potassium: 4 mmol/L (ref 3.5–5.1)
Sodium: 139 mmol/L (ref 135–145)
Total Bilirubin: 0.6 mg/dL (ref 0.3–1.2)
Total Protein: 7.6 g/dL (ref 6.5–8.1)

## 2020-04-26 LAB — CBC WITH DIFFERENTIAL/PLATELET
Abs Immature Granulocytes: 0.04 10*3/uL (ref 0.00–0.07)
Basophils Absolute: 0.1 10*3/uL (ref 0.0–0.1)
Basophils Relative: 1 %
Eosinophils Absolute: 0.4 10*3/uL (ref 0.0–0.5)
Eosinophils Relative: 5 %
HCT: 34.7 % — ABNORMAL LOW (ref 36.0–46.0)
Hemoglobin: 11.8 g/dL — ABNORMAL LOW (ref 12.0–15.0)
Immature Granulocytes: 1 %
Lymphocytes Relative: 17 %
Lymphs Abs: 1.2 10*3/uL (ref 0.7–4.0)
MCH: 32.2 pg (ref 26.0–34.0)
MCHC: 34 g/dL (ref 30.0–36.0)
MCV: 94.6 fL (ref 80.0–100.0)
Monocytes Absolute: 1 10*3/uL (ref 0.1–1.0)
Monocytes Relative: 14 %
Neutro Abs: 4.4 10*3/uL (ref 1.7–7.7)
Neutrophils Relative %: 62 %
Platelets: 201 10*3/uL (ref 150–400)
RBC: 3.67 MIL/uL — ABNORMAL LOW (ref 3.87–5.11)
RDW: 13.9 % (ref 11.5–15.5)
WBC: 7 10*3/uL (ref 4.0–10.5)
nRBC: 0 % (ref 0.0–0.2)

## 2020-04-26 MED ORDER — DENOSUMAB 60 MG/ML ~~LOC~~ SOSY
60.0000 mg | PREFILLED_SYRINGE | Freq: Once | SUBCUTANEOUS | Status: AC
  Administered 2020-04-26: 60 mg via SUBCUTANEOUS
  Filled 2020-04-26: qty 1

## 2020-04-26 MED ORDER — ANASTROZOLE 1 MG PO TABS: 1.0000 mg | ORAL_TABLET | Freq: Every day | ORAL | 3 refills | Status: DC

## 2020-04-26 NOTE — Progress Notes (Signed)
[facility] [place] NOTE  Patient Care Team: Derinda Late, MD as PCP - General (Family Medicine) Cammie Sickle, MD as Consulting Physician (Internal Medicine) Bary Castilla Forest Gleason, MD as Consulting Physician (General Surgery) Theodore Demark, RN as Oncology Nurse Navigator Noreene Filbert, MD as Referring Physician (Radiation Oncology)  CHIEF COMPLAINTS/PURPOSE OF CONSULTATION: Breast cancer  #  Oncology History Overview Note  # June 2021-Right Breast- 3:00 right invasive mammary carcinoma with micropapillary features-status postmastectomy-[Dr. Burnett] stage III T2N3 [19/24 LN]; ER/PR positive HER-2 negative.   # Left breast-invasive mammary carcinoma-status post mastectomy [id number]; stage I-ER/PR positive HER-2 negative  #November 23, 2019-start adjuvant radiation bilateral [finished August 31]; no chemo;   #September 22nd 2021- anastrozole ----------------------------------------------------------------------------------------   A. RIGHT BREAST, 3:00; ULTRASOUND-GUIDED BIOPSY:  - INVASIVE MAMMARY CARCINOMA WITH MICROPAPILLARY FEATURES; Size of invasive carcinoma: 81m in this sample  Histologic grade of invasive carcinoma: Grade 2 ; ER/PR > 905: her 2- NEG  A. RIGHT BREAST, 3:00; ULTRASOUND-GUIDED BIOPSY: - INVASIVE MAMMARY CARCINOMA WITH MICROPAPILLARY FEATURES. 165min this sample. Grade 2. Lymphovascular invasion: Present.   B. LYMPH NODE, RIGHT AXILLARY; ULTRASOUND-GUIDED NEEDLE CORE BIOPSY: - METASTATIC MAMMARY CARCINOMA, 6 MM IN THIS SAMPLE.   C. RIGHT BREAST, LATERAL CALCIFICATIONS; STEREOTACTIC BIOPSY: - DUCTAL CARCINOMA IN SITU, INTERMEDIATE GRADE, WITH CALCIFICATIONS. -------------------------------------------------------------   # CKD- Stage III-IV [Dr.Kolluru]  # SURVIVORSHIP:   # GENETICS:   DIAGNOSIS:  Right breast cancer stage III;  left breast cancer-stage I GOALS: Cure  CURRENT/MOST RECENT THERAPY : Anastrozole    Carcinoma of  upper-inner quadrant of right breast in female, estrogen receptor positive ([facility] 09/27/2019 Initial Diagnosis   Carcinoma of upper-inner quadrant of right breast in female, estrogen receptor positive ([facility]     HISTORY OF PRESENTING ILLNESS:   Tina LIPPS62.o.  female synchronous bilateral breast cancer [right stage III ER/PR positive;Her2 NEG; left stage I ER/PR positive; Her2- NEG] s/p adjuvant radiation; currently on Anastrazole is here for follow-up.  Patient denies any worsening joint pains or bone pain.  Appetite is good.  No weight loss.  No nausea vomiting.  No chest pain or shortness of breath or cough.  Mild to moderate fatigue.  Review of Systems  Constitutional: Positive for malaise/fatigue. Negative for chills, diaphoresis, fever and weight loss.  HENT: Negative for nosebleeds and sore throat.   Eyes: Negative for double vision.  Respiratory: Negative for cough, hemoptysis, sputum production, shortness of breath and wheezing.   Cardiovascular: Negative for chest pain, palpitations, orthopnea and leg swelling.  Gastrointestinal: Negative for abdominal pain, blood in stool, constipation, diarrhea, heartburn, melena, nausea and vomiting.  Genitourinary: Negative for dysuria, frequency and urgency.  Musculoskeletal: Positive for back pain and joint pain.  Neurological: Negative for dizziness, tingling, focal weakness, weakness and headaches.  Endo/Heme/Allergies: Does not bruise/bleed easily.  Psychiatric/Behavioral: Negative for depression. The patient is not nervous/anxious and does not have insomnia.      MEDICAL HISTORY:  Past Medical History:  Diagnosis Date  . Arthritis    Gout  . Chronic kidney disease   . Diarrhea   . Hypercholesteremia   . Hypertension   . Neuropathy    feet and lower legs  . Seasonal allergies   . Skin cancer    face    SURGICAL HISTORY: Past Surgical History:  Procedure Laterality Date  . ABDOMINAL HYSTERECTOMY  2000  .  APPENDECTOMY    . BREAST BIOPSY Right 09/19/2019   affirm bx of calcs  UOQ, x marker, path pending  . BREAST BIOPSY Right 09/19/2019   [place] bx of mass,heart marker, path pending  . BREAST BIOPSY Right 09/19/2019   [place] bx of LN, coil marker, path pending  . CATARACT EXTRACTION Left   . CATARACT EXTRACTION W/PHACO Right 10/24/2014   Procedure: CATARACT EXTRACTION PHACO AND INTRAOCULAR LENS PLACEMENT (IOC);  Surgeon: Leandrew Koyanagi, MD;  Location: [facility];  Service: Ophthalmology;  Laterality: Right;  . ESOPHAGOGASTRODUODENOSCOPY N/A 03/07/2018   Procedure: ESOPHAGOGASTRODUODENOSCOPY (EGD);  Surgeon: Lin Landsman, MD;  Location: [facility] ENDOSCOPY;  Service: Gastroenterology;  Laterality: N/A;  . MASTECTOMY MODIFIED RADICAL Right 10/13/2019   Procedure: MASTECTOMY MODIFIED RADICAL;  Surgeon: Robert Bellow, MD;  Location: [facility];  Service: General;  Laterality: Right;  . RENAL ANGIOGRAPHY Right 04/11/2018   Procedure: RENAL ANGIOGRAPHY;  Surgeon: Algernon Huxley, MD;  Location: [facility];  Service: Cardiovascular;  Laterality: Right;  . SIMPLE MASTECTOMY WITH AXILLARY SENTINEL NODE BIOPSY Left 10/13/2019   Procedure: SIMPLE MASTECTOMY TRUE CUT BIOPSY, SENTINEL NODE BIOPSY;  Surgeon: Robert Bellow, MD;  Location: [facility];  Service: General;  Laterality: Left;    SOCIAL HISTORY: Social History   Socioeconomic History  . Marital status: Widowed    Spouse name: Not on file  . Number of children: Not on file  . Years of education: Not on file  . Highest education level: Not on file  Occupational History  . Not on file  Tobacco Use  . Smoking status: Never Smoker  . Smokeless tobacco: Never Used  Substance and Sexual Activity  . Alcohol use: No  . Drug use: Never  . Sexual activity: Not on file  Other Topics Concern  . Not on file  Social History Narrative   2 daughters; lives in [place]; Pharmacist, 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 Walks  cane/walker.    Social Determinants of Health   Financial Resource Strain: Not on file  Food Insecurity: Not on file  Transportation Needs: Not on file  Physical Activity: Not on file  Stress: Not on file  Social Connections: Not on file  Intimate Partner Violence: Not on file    FAMILY HISTORY: Family History  Problem Relation Age of Onset  . Hypertension Mother   . Hypertension Father   . Stroke Father   . Breast cancer Daughter 37    ALLERGIES:  is allergic to penicillins and drug ingredient [zinc].  MEDICATIONS:  Current Outpatient Medications  Medication Sig Dispense Refill  . allopurinol (ZYLOPRIM) 100 MG tablet     . amLODipine (NORVASC) 5 MG tablet Take 2.5 mg by mouth daily.   1  . aspirin EC 81 MG tablet Take 81 mg by mouth daily.    . Baclofen 5 MG TABS Take 2.5 mg by mouth in the morning and at bedtime.     . Cholecalciferol (VITAMIN D PO) Take 1 tablet by mouth daily as needed (winter months).     . citalopram (CELEXA) 10 MG tablet Take 10 mg by mouth daily.     . clopidogrel (PLAVIX) 75 MG tablet TAKE 1 TABLET BY MOUTH EVERY DAY 30 tablet 1  . docusate sodium (COLACE) 100 MG capsule Take 100 mg by mouth daily as needed for mild constipation.    . DULoxetine (CYMBALTA) 60 MG capsule Take 60 mg by mouth daily.     . furosemide (LASIX) 20 MG tablet Take 20 mg by mouth daily.     Marland Kitchen gabapentin (NEURONTIN)  100 MG capsule Take 100 mg by mouth at bedtime.     . hydrALAZINE (APRESOLINE) 50 MG tablet Take 1 tablet (50 mg total) by mouth 2 (two) times daily. 30 tablet 0  . HYDROcodone-acetaminophen (NORCO/VICODIN) 5-325 MG tablet Take 0.5-1 tablets by mouth every 4 (four) hours as needed for moderate pain. 12 tablet 0  . lidocaine-prilocaine (EMLA) cream Apply 1 application topically as needed (to breast).    Marland Kitchen loratadine (CLARITIN) 10 MG tablet Take 10 mg by mouth daily as needed for allergies. AM    . metoprolol succinate (TOPROL-XL) 25 MG 24 hr tablet Take 37.5 mg by  mouth at bedtime.     . Multiple Vitamins-Minerals (ICAPS PO) Take 1 capsule by mouth in the morning and at bedtime.     . polyethylene glycol (MIRALAX / GLYCOLAX) packet Take 17 g by mouth daily as needed. 60 each 0  . simvastatin (ZOCOR) 20 MG tablet Take 20 mg by mouth every evening. PM    . triamcinolone ointment (KENALOG) 0.5 % Apply 1 application topically daily. Apply to rash. 30 g 0  . anastrozole (ARIMIDEX) 1 MG tablet Take 1 tablet (1 mg total) by mouth daily. DO NOT START if RASH is NOT better 90 tablet 3   No current facility-administered medications for this visit.      Marland Kitchen  PHYSICAL EXAMINATION: ECOG PERFORMANCE STATUS: 0 - Asymptomatic  Vitals:   04/26/20 1438  BP: 136/60  Pulse: 75  Resp: 16  Temp: 99 F (37.2 C)  SpO2: 98%   Filed Weights   04/26/20 1438  Weight: 147 lb (66.7 kg)    Physical Exam Constitutional:      Comments: Walks with a cane.with daughter.   HENT:     Head: Normocephalic and atraumatic.     Mouth/Throat:     Pharynx: No oropharyngeal exudate.  Eyes:     Pupils: Pupils are equal, round, and reactive to light.  Cardiovascular:     Rate and Rhythm: Normal rate and regular rhythm.  Pulmonary:     Effort: Pulmonary effort is normal. No respiratory distress.     Breath sounds: Normal breath sounds. No wheezing.  Abdominal:     General: Bowel sounds are normal. There is no distension.     Palpations: Abdomen is soft. There is no mass.     Tenderness: There is no abdominal tenderness. There is no guarding or rebound.  Musculoskeletal:        General: No tenderness. Normal range of motion.     Cervical back: Normal range of motion and neck supple.  Skin:    General: Skin is warm.     Comments: See images below   Neurological:     Mental Status: She is alert and oriented to person, place, and time.  Psychiatric:        Mood and Affect: Affect normal.    LABORATORY DATA:  I have reviewed the data as listed Lab Results  Component  Value Date   WBC 7.0 04/26/2020   HGB 11.8 (L) 04/26/2020   HCT 34.7 (L) 04/26/2020   MCV 94.6 04/26/2020   PLT 201 04/26/2020   Recent Labs    11/17/19 1014 12/20/19 0910 01/18/20 0935 02/26/20 0954 04/26/20 1428  NA 142 140 139 142 139  K 4.6 4.9 4.7 4.6 4.0  CL 105 105 103 108 104  CO2 '27 25 26 26 24  ' GLUCOSE 139* 123* 127* 140* 157*  BUN 48* 38* 47* 38*  40*  CREATININE 1.88* 1.72* 1.83* 1.65* 1.50*  CALCIUM 9.5 9.4 9.5 9.7 9.4  GFRNONAA 23* 26* 24* 27* 33*  GFRAA 27* 30* 28*  --   --   PROT 7.4 7.4 8.1 7.8 7.6  ALBUMIN 4.1 4.3 4.4 4.5 4.2  AST '27 25 27 29 25  ' ALT '16 15 19 18 17  ' ALKPHOS 105 110 109 91 93  BILITOT 0.7 0.6 0.7 0.7 0.6          RADIOGRAPHIC STUDIES: I have personally reviewed the radiological images as listed and agreed with the findings in the report. No results found.  ASSESSMENT & PLAN:   Carcinoma of upper-inner quadrant of right breast in female, estrogen receptor positive ([place]) #Synchronous bilateral breast cancer-status post bilateral mastectomies-   Right Breast-stage III ER/PR positive HER-2 NEG; & Left breast cancer stage II ER/PR Pos; Her 2 NEG. Currently on anastrozole. STABLE; tumor markers pending.  #Continue anastrozole.  Tolerating fairly well.  # CKD- Stage- IV- GFR 27 [Dr.Kolluru]; STABLE.   # Depressed: Patient is currently on Celexa; STABLE.   # NOV 2021- OsteoporosisT-scoreof -3.7.on ca+vit D. Prolia q 6 months; injection today.  Again reviewed the potential side effects.  # DISPOSITION: # Prolia SQ # follow up in 2 months- MD; labs; cbc/cmp/ca 27-29;ca 15-3;cea;-Dr.B   Cc;Dr.Baboff   All questions were answered. The patient/family knows to call the clinic with any problems, questions or concerns.     Cammie Sickle, MD 04/28/2020 9:05 PM

## 2020-04-26 NOTE — Assessment & Plan Note (Addendum)
#  Synchronous bilateral breast cancer-status post bilateral mastectomies-   Right Breast-stage III ER/PR positive HER-2 NEG; & Left breast cancer stage II ER/PR Pos; Her 2 NEG. Currently on anastrozole. STABLE; tumor markers pending.  #Continue anastrozole.  Tolerating fairly well.  # CKD- Stage- IV- GFR 27 [Dr.Kolluru]; STABLE.   # Depressed: Patient is currently on Celexa; STABLE.   # NOV 2021- OsteoporosisT-scoreof -3.7.on ca+vit D. Prolia q 6 months; injection today.  Again reviewed the potential side effects.  # DISPOSITION: # Prolia SQ # follow up in 2 months- MD; labs; cbc/cmp/ca 27-29;ca 15-3;cea;-Dr.B   Cc;Dr.Baboff

## 2020-04-27 LAB — CANCER ANTIGEN 15-3: CA 15-3: 41.3 U/mL — ABNORMAL HIGH (ref 0.0–25.0)

## 2020-04-27 LAB — CEA: CEA: 6.4 ng/mL — ABNORMAL HIGH (ref 0.0–4.7)

## 2020-04-27 LAB — CANCER ANTIGEN 27.29: CA 27.29: 49.6 U/mL — ABNORMAL HIGH (ref 0.0–38.6)

## 2020-04-30 ENCOUNTER — Telehealth: Payer: Self-pay | Admitting: Internal Medicine

## 2020-04-30 NOTE — Telephone Encounter (Signed)
On 12/21-I left a long detailed voicemail for the patient daughter [name] regarding the slightly elevated tumor marker.  Given patient's age/stressed out mental status/also limited options given her age I think it is reasonable to continue monitoring her tumor markers at this time. If significantly elevated I would recommend further imaging; alternate treatment options.  For now recommend close surveillance.  Recommend call us if any questions or concerns.

## 2020-05-15 ENCOUNTER — Ambulatory Visit: Payer: Medicare PPO | Admitting: Occupational Therapy

## 2020-05-29 ENCOUNTER — Other Ambulatory Visit: Payer: Self-pay

## 2020-05-29 ENCOUNTER — Inpatient Hospital Stay: Payer: Medicare PPO | Attending: Internal Medicine | Admitting: Occupational Therapy

## 2020-05-29 DIAGNOSIS — M25612 Stiffness of left shoulder, not elsewhere classified: Secondary | ICD-10-CM

## 2020-05-29 DIAGNOSIS — M25611 Stiffness of right shoulder, not elsewhere classified: Secondary | ICD-10-CM

## 2020-05-29 NOTE — Therapy (Signed)
[facility] [address] [place], [place], [place], [address] Phone: [phone]   Fax:  [phone]  Occupational Therapy Screen  Patient Details  Name: Tina Curtis MRN: [id number] Date of Birth: [birth year] No data recorded  Encounter Date: 05/29/2020   OT End of Session - 05/29/20 1237    Visit Number 0           Past Medical History:  Diagnosis Date  . Arthritis    Gout  . Chronic kidney disease   . Diarrhea   . Hypercholesteremia   . Hypertension   . Neuropathy    feet and lower legs  . Seasonal allergies   . Skin cancer    face    Past Surgical History:  Procedure Laterality Date  . ABDOMINAL HYSTERECTOMY  2000  . APPENDECTOMY    . BREAST BIOPSY Right 09/19/2019   affirm bx of calcs UOQ, x marker, path pending  . BREAST BIOPSY Right 09/19/2019   [place] bx of mass,heart marker, path pending  . BREAST BIOPSY Right 09/19/2019   [place] bx of LN, coil marker, path pending  . CATARACT EXTRACTION Left   . CATARACT EXTRACTION W/PHACO Right 10/24/2014   Procedure: CATARACT EXTRACTION PHACO AND INTRAOCULAR LENS PLACEMENT (IOC);  Surgeon: Leandrew Koyanagi, MD;  Location: [place];  Service: Ophthalmology;  Laterality: Right;  . ESOPHAGOGASTRODUODENOSCOPY N/A 03/07/2018   Procedure: ESOPHAGOGASTRODUODENOSCOPY (EGD);  Surgeon: Lin Landsman, MD;  Location: [facility] ENDOSCOPY;  Service: Gastroenterology;  Laterality: N/A;  . MASTECTOMY MODIFIED RADICAL Right 10/13/2019   Procedure: MASTECTOMY MODIFIED RADICAL;  Surgeon: Robert Bellow, MD;  Location: [facility] ORS;  Service: General;  Laterality: Right;  . RENAL ANGIOGRAPHY Right 04/11/2018   Procedure: RENAL ANGIOGRAPHY;  Surgeon: Algernon Huxley, MD;  Location: [facility];  Service: Cardiovascular;  Laterality: Right;  . SIMPLE MASTECTOMY WITH AXILLARY SENTINEL NODE BIOPSY Left 10/13/2019   Procedure: SIMPLE MASTECTOMY TRUE CUT BIOPSY, SENTINEL NODE  BIOPSY;  Surgeon: Robert Bellow, MD;  Location: [facility] ORS;  Service: General;  Laterality: Left;    There were no vitals filed for this visit.   Subjective Assessment - 05/29/20 1235    Subjective  Sorry, I had to cancel my last appt - my daughter was here and then she tested positive for COVID - doing okay -do not feel my arm is fuller , heavier or swollen - and not under my arm - but still pain cont in upper back and R shoulder sometimes    Currently in Pain? No/denies                    OT SCREEN 04/10/2020: Pt with bilateral breaste CA with mastectomy - per pt 26 ln removed R ,L 1 or 2 ln removed  Radiation and now on Anastrozole since Sept  She lives at [facility] and walk 1/2 mile to mile every day -use cane or walker since she fell about 2 1/2 yrs ago Did not had any falls since then Retired Pharmacist, hospital  Report some pain and discomfort bilateral shoulders and upper traps- R worse than L - pain increase to 5/10 on the R  Decrease end range for flexion , ext and stiff over pect with ext rotation  Pt was refer for OT screen by survivorship care - pt at risk for lymphedema R worse than L. Pt do show increase circumference in R upper arm by 2.4 cm - more than  dominancy will allow - will reassess in 2 wks   Pt was ed on HEP for AAROM to bilateral shoulder -on wall for flexion and ABD - 10 reps - 1 -2 x day - slight pull under axilla and arm - no in shoulder  Stop when 1-2/10 stretch  Scapula retraction - 4 x day 10 reps  Handout provided on lymphedema - info , sign, symptoms and precautions- pt to read and ask if questions next time -and will review some more with pt - pt was late for appt- could not get car started  Will follow up in 2 wks again    OT Screen 04/24/2020: Pt with bilateral breast CA with mastectomy - per pt 26 ln removed R ,L 1 or 2 ln removed  Radiation and now on Anastrozole since Sept  She lives at [facility] and walk 1/2 mile to mile every day -use  cane or walker since she fell about 2 1/2 yrs ago Did not had any falls since then Retired Pharmacist, hospital  Report some more pain over neck and upper back - 3/10  Has history of arthritis  Some pain but more stiffness under arms with  Decrease AROM for shoulder ABD and external rotaion - flexion improve this date   Pt was refer for OT screen by survivorship care - pt at risk for lymphedema R worse than L. Pt do show increase circumference in R upper arm still but will monitor - she is R hand dominant - will reassess in 3 wks  Review AAROM HEP with pt again - and change to decrease sets during day and not push or force her HEP  AAROM to bilateral shoulder -on wall for flexion and ABD - 10 reps - 1 -2 x day - slight pull under axilla and arm - not in shoulder  Stop when 1-2/10 stretch  Scapula retraction - 4 x day 10 reps  Gentle pect stretch on wall - 10 reps hold 5 sec - prior to ABD  OT SCREEN - 05/29/2020:      LYMPHEDEMA/ONCOLOGY QUESTIONNAIRE - 05/29/20 0001      Right Upper Extremity Lymphedema   15 cm Proximal to Olecranon Process 27.5 cm    10 cm Proximal to Olecranon Process 26 cm    Olecranon Process 22.5 cm      Left Upper Extremity Lymphedema   15 cm Proximal to Olecranon Process 25.5 cm    10 cm Proximal to Olecranon Process 24 cm    Olecranon Process 22.5 cm           Pt with bilateral breast CA with mastectomy - per pt 26 ln removed R ,L 1 or 2 ln removed  Pt bilateral UE measured - no increase swelling- doing very well -and with her activity level - review again symptoms of lymphedema- she sometimes pick up her walker when she is out and about. She is R hand dominant Pt was refer for OT screen by survivorship care - pt at risk for lymphedema R worse than L.  She lives at [facility] and walk 1/2 mile to mile every day -use cane or walker since she fell about 2 1/2 yrs ago Did not had any falls since then Retired Pharmacist, hospital  Report continues pain over neck and upper  back - 3/10  Has history of arthritis  Some pain in R shoulder more than L and some stiffness under R arm but improved     Review again AROM HEP  with pt again -BUT reinforce to keep pain free  AAROM to bilateral shoulder -on wall for flexion and ABD - 10 reps - gentle after shower - 1 x day - slight pull under axilla and arm - not in shoulder  Stop when 1-2/10 stretch  Scapula retraction - 2 x day 10 reps but do not force Gentle pect stretch in supine in bed with elbows if needed on pillowl - 10 reps hold 5 sec  Pt understand - pt to cont at Home with HEP pain free - and did contact the person over the exercise classes at [facility] to participate in those again- she did it prior to her breast CA Pt to call if need to be seen otherwise can cont at home                   Patient will benefit from skilled therapeutic intervention in order to improve the following deficits and impairments:           Visit Diagnosis: Stiffness of right shoulder, not elsewhere classified  Stiffness of left shoulder, not elsewhere classified    Problem List Patient Active Problem List   Diagnosis Date Noted  . Osteoporosis of forearm 03/28/2020  . Breast cancer ([place]) 10/13/2019  . 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 Anemia in chronic kidney disease 02/01/2019  . Atherosclerosis of renal artery ([place]) 02/01/2019  . Benign hypertensive kidney disease with chronic kidney disease 02/01/2019  . Hyposmolality and/or hyponatremia 02/01/2019  . Proteinuria 02/01/2019  . Secondary hyperparathyroidism of renal origin ([place]) 02/01/2019  . Chronic kidney disease, stage IV (severe) ([place]) 04/01/2018  . 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 Renal artery stenosis ([place]) 04/01/2018  . Nausea & vomiting 03/05/2018  . AKI (acute kidney injury) ([place]) 02/17/2018  . Acquired hammer toe of right foot 08/14/2014  . Bunion of right  foot 08/14/2014  . Gout 08/14/2014  . History of Clostridium difficile colitis 08/14/2014  . Pure hypercholesterolemia 08/14/2014  . RLS (restless legs syndrome) 08/14/2014  . Hallux rigidus of right foot 04/24/2014  . Primary osteoarthritis of foot 09/01/2013  . Fatigue 09/30/2012  . Gluteus medius or minimus syndrome 08/23/2012  . Trochanteric bursitis 08/12/2012  . Benign essential hypertension 09/03/2011  . Diverticulosis of colon 09/03/2011  . Osteopenia 09/03/2011  . Enthesopathy of ankle and tarsus 04/13/2011  . Osteoarthritis of spine with radiculopathy, lumbosacral region 09/30/2010  . Allergic rhinitis 10/31/1928    Rosalyn Gess OTR/L,CLT 05/29/2020, 12:38 PM  [facility] [address] [place], [place], [place], [address] Phone: [phone]   Fax:  [phone]  Name: Tina Curtis MRN: [id number] Date of Birth: April 01, 1927

## 2020-06-12 ENCOUNTER — Ambulatory Visit: Payer: Medicare PPO | Admitting: Podiatry

## 2020-06-12 ENCOUNTER — Other Ambulatory Visit: Payer: Self-pay

## 2020-06-12 ENCOUNTER — Encounter: Payer: Self-pay | Admitting: Podiatry

## 2020-06-12 DIAGNOSIS — B351 Tinea unguium: Secondary | ICD-10-CM

## 2020-06-12 DIAGNOSIS — M79674 Pain in right toe(s): Secondary | ICD-10-CM | POA: Diagnosis not present

## 2020-06-12 DIAGNOSIS — M79675 Pain in left toe(s): Secondary | ICD-10-CM

## 2020-06-12 DIAGNOSIS — L84 Corns and callosities: Secondary | ICD-10-CM

## 2020-06-12 DIAGNOSIS — R52 Pain, unspecified: Secondary | ICD-10-CM

## 2020-06-14 NOTE — Progress Notes (Signed)
  Subjective:  Patient ID: Tina Curtis, female    [name]: Dec 23, 1926,  MRN: [id number]  Chief Complaint  Patient presents with  . Nail Problem  . Callouses    Nail and callous trim RFC    87 y.o. female presents with the above complaint. History confirmed with patient.  Nails are painful and elongated.  Calluses are thickened as well.  Objective:  Physical Exam: warm, good capillary refill, no trophic changes or ulcerative lesions, normal DP and PT pulses and normal sensory exam.  Thickened elongated yellowed dystrophic toenails x10.  Hyperkeratotic tissue on 2 lesions on the right foot Assessment:  No diagnosis found.   Plan:  Patient was evaluated and treated and all questions answered.  Discussed the etiology and treatment options for the condition in detail with the patient. Educated patient on the topical and oral treatment options for mycotic nails. Recommended debridement of the nails today. Sharp and mechanical debridement performed of all painful and mycotic nails today. Nails debrided in length and thickness using a nail nipper to level of comfort. Discussed treatment options including appropriate shoe gear. Follow up as needed for painful nails.   All symptomatic hyperkeratoses were safely debrided with a sterile #15 blade to patient's level of comfort without incident. We discussed preventative and palliative care of these lesions including supportive and accommodative shoegear, padding, prefabricated and custom molded accommodative orthoses, use of a pumice stone and lotions/creams daily.   Return in about 12 weeks (around 09/04/2020) for painful toenails and calluses.

## 2020-06-17 ENCOUNTER — Ambulatory Visit: Payer: Medicare PPO | Admitting: Podiatry

## 2020-06-26 ENCOUNTER — Other Ambulatory Visit: Payer: Self-pay

## 2020-06-26 DIAGNOSIS — C50211 Malignant neoplasm of upper-inner quadrant of right female breast: Secondary | ICD-10-CM

## 2020-06-26 DIAGNOSIS — Z170 Estrogen receptor positive status [ER+]: Secondary | ICD-10-CM

## 2020-06-26 DIAGNOSIS — M25611 Stiffness of right shoulder, not elsewhere classified: Secondary | ICD-10-CM

## 2020-06-28 ENCOUNTER — Encounter: Payer: Self-pay | Admitting: Internal Medicine

## 2020-06-28 ENCOUNTER — Inpatient Hospital Stay (HOSPITAL_BASED_OUTPATIENT_CLINIC_OR_DEPARTMENT_OTHER): Payer: Medicare PPO | Admitting: Internal Medicine

## 2020-06-28 ENCOUNTER — Inpatient Hospital Stay: Payer: Medicare PPO | Attending: Internal Medicine

## 2020-06-28 ENCOUNTER — Other Ambulatory Visit: Payer: Self-pay

## 2020-06-28 VITALS — BP 132/67 | HR 71 | Temp 98.8°F | Resp 16 | Ht 64.0 in | Wt 150.0 lb

## 2020-06-28 DIAGNOSIS — C50211 Malignant neoplasm of upper-inner quadrant of right female breast: Secondary | ICD-10-CM

## 2020-06-28 DIAGNOSIS — N184 Chronic kidney disease, stage 4 (severe): Secondary | ICD-10-CM | POA: Diagnosis not present

## 2020-06-28 DIAGNOSIS — Z79811 Long term (current) use of aromatase inhibitors: Secondary | ICD-10-CM | POA: Insufficient documentation

## 2020-06-28 DIAGNOSIS — Z7982 Long term (current) use of aspirin: Secondary | ICD-10-CM | POA: Insufficient documentation

## 2020-06-28 DIAGNOSIS — M810 Age-related osteoporosis without current pathological fracture: Secondary | ICD-10-CM | POA: Insufficient documentation

## 2020-06-28 DIAGNOSIS — Z170 Estrogen receptor positive status [ER+]: Secondary | ICD-10-CM

## 2020-06-28 DIAGNOSIS — Z803 Family history of malignant neoplasm of breast: Secondary | ICD-10-CM | POA: Insufficient documentation

## 2020-06-28 DIAGNOSIS — Z90710 Acquired absence of both cervix and uterus: Secondary | ICD-10-CM | POA: Insufficient documentation

## 2020-06-28 DIAGNOSIS — F32A Depression, unspecified: Secondary | ICD-10-CM | POA: Diagnosis not present

## 2020-06-28 DIAGNOSIS — Z79899 Other long term (current) drug therapy: Secondary | ICD-10-CM | POA: Insufficient documentation

## 2020-06-28 DIAGNOSIS — Z8249 Family history of ischemic heart disease and other diseases of the circulatory system: Secondary | ICD-10-CM | POA: Insufficient documentation

## 2020-06-28 DIAGNOSIS — Z9013 Acquired absence of bilateral breasts and nipples: Secondary | ICD-10-CM | POA: Insufficient documentation

## 2020-06-28 LAB — CBC WITH DIFFERENTIAL/PLATELET
Abs Immature Granulocytes: 0.03 10*3/uL (ref 0.00–0.07)
Basophils Absolute: 0.1 10*3/uL (ref 0.0–0.1)
Basophils Relative: 1 %
Eosinophils Absolute: 0.3 10*3/uL (ref 0.0–0.5)
Eosinophils Relative: 4 %
HCT: 36.7 % (ref 36.0–46.0)
Hemoglobin: 12.3 g/dL (ref 12.0–15.0)
Immature Granulocytes: 0 %
Lymphocytes Relative: 20 %
Lymphs Abs: 1.4 10*3/uL (ref 0.7–4.0)
MCH: 32.1 pg (ref 26.0–34.0)
MCHC: 33.5 g/dL (ref 30.0–36.0)
MCV: 95.8 fL (ref 80.0–100.0)
Monocytes Absolute: 0.8 10*3/uL (ref 0.1–1.0)
Monocytes Relative: 11 %
Neutro Abs: 4.4 10*3/uL (ref 1.7–7.7)
Neutrophils Relative %: 64 %
Platelets: 174 10*3/uL (ref 150–400)
RBC: 3.83 MIL/uL — ABNORMAL LOW (ref 3.87–5.11)
RDW: 14.1 % (ref 11.5–15.5)
WBC: 6.9 10*3/uL (ref 4.0–10.5)
nRBC: 0 % (ref 0.0–0.2)

## 2020-06-28 LAB — COMPREHENSIVE METABOLIC PANEL
ALT: 16 U/L (ref 0–44)
AST: 26 U/L (ref 15–41)
Albumin: 4 g/dL (ref 3.5–5.0)
Alkaline Phosphatase: 64 U/L (ref 38–126)
Anion gap: 11 (ref 5–15)
BUN: 36 mg/dL — ABNORMAL HIGH (ref 8–23)
CO2: 23 mmol/L (ref 22–32)
Calcium: 8.9 mg/dL (ref 8.9–10.3)
Chloride: 107 mmol/L (ref 98–111)
Creatinine, Ser: 1.35 mg/dL — ABNORMAL HIGH (ref 0.44–1.00)
GFR, Estimated: 37 mL/min — ABNORMAL LOW (ref >60)
Glucose, Bld: 150 mg/dL — ABNORMAL HIGH (ref 70–99)
Potassium: 4.9 mmol/L (ref 3.5–5.1)
Sodium: 141 mmol/L (ref 135–145)
Total Bilirubin: 0.6 mg/dL (ref 0.3–1.2)
Total Protein: 7.1 g/dL (ref 6.5–8.1)

## 2020-06-28 NOTE — Progress Notes (Signed)
Having bilateral feet and leg pains. Wondering if it is from prolia. Having more headaches in the mornings. Has been going on for a month.

## 2020-06-28 NOTE — Assessment & Plan Note (Addendum)
#  Synchronous bilateral breast cancer-status post bilateral mastectomies-   Right Breast-stage III ER/PR positive HER-2 NEG; & Left breast cancer stage II ER/PR Pos; Her 2 NEG. Currently on anastrozole. STABLE- TM-elevated but STABLE. Awaiting TM today.  #Continue anastrozole.  Tolerating fairly well except for mild joint pains.  # Mild arthritic pain- G-1-2; recommend continue tylenol prn.    # Fatigue [s/p OT;Maureen]-   # CKD- Stage- IV- GFR 27 [Dr.Kolluru]; STABLE.   # Depressed: Patient is currently on Celexa; STABLE.    # NOV 2021- OsteoporosisT-scoreof -3.7.on ca+vit D. Prolia q 6 months [Dec 2021]  # DISPOSITION: # follow up in 2 months- MD; labs; cbc/cmp/ca 27-29;ca 15-3;cea;vit D levels-Dr.B   Addendum: I spoke to patient's daughter regarding improvement of the tumor Simonne Martinet is a good sign.  However I did discuss the possible concerns for underlying metastatic disease/stage IV malignancy.  However, given her age/poor tolerance to therapies in general I think is reasonable to continue AI as long as her tumor markers are improving.  The daughter appreciates the information/thank you for the call.  Cc;Dr.Baboff

## 2020-06-28 NOTE — Progress Notes (Signed)
[facility] [place] NOTE  Patient Care Team: Derinda Late, MD as PCP - General (Family Medicine) Cammie Sickle, MD as Consulting Physician (Internal Medicine) Bary Castilla Forest Gleason, MD as Consulting Physician (General Surgery) Theodore Demark, RN as Oncology Nurse Navigator Noreene Filbert, MD as Referring Physician (Radiation Oncology)  CHIEF COMPLAINTS/PURPOSE OF CONSULTATION: Breast cancer  #  Oncology History Overview Note  # June 2021-Right Breast- 3:00 right invasive mammary carcinoma with micropapillary features-status postmastectomy-[Dr. Burnett] stage III T2N3 [19/24 LN]; ER/PR positive HER-2 negative.   # Left breast-invasive mammary carcinoma-status post mastectomy [id number]; stage I-ER/PR positive HER-2 negative  #November 23, 2019-start adjuvant radiation bilateral [finished August 31]; no chemo;   #September 22nd 2021- anastrozole ----------------------------------------------------------------------------------------   A. RIGHT BREAST, 3:00; ULTRASOUND-GUIDED BIOPSY:  - INVASIVE MAMMARY CARCINOMA WITH MICROPAPILLARY FEATURES; Size of invasive carcinoma: 49m in this sample  Histologic grade of invasive carcinoma: Grade 2 ; ER/PR > 905: her 2- NEG  A. RIGHT BREAST, 3:00; ULTRASOUND-GUIDED BIOPSY: - INVASIVE MAMMARY CARCINOMA WITH MICROPAPILLARY FEATURES. 186min this sample. Grade 2. Lymphovascular invasion: Present.   B. LYMPH NODE, RIGHT AXILLARY; ULTRASOUND-GUIDED NEEDLE CORE BIOPSY: - METASTATIC MAMMARY CARCINOMA, 6 MM IN THIS SAMPLE.   C. RIGHT BREAST, LATERAL CALCIFICATIONS; STEREOTACTIC BIOPSY: - DUCTAL CARCINOMA IN SITU, INTERMEDIATE GRADE, WITH CALCIFICATIONS. -------------------------------------------------------------   # CKD- Stage III-IV [Dr.Kolluru]  # SURVIVORSHIP:   # GENETICS:   DIAGNOSIS:  Right breast cancer stage III;  left breast cancer-stage I GOALS: Cure  CURRENT/MOST RECENT THERAPY : Anastrozole    Carcinoma of  upper-inner quadrant of right breast in female, estrogen receptor positive ([facility] 09/27/2019 Initial Diagnosis   Carcinoma of upper-inner quadrant of right breast in female, estrogen receptor positive ([facility]     HISTORY OF PRESENTING ILLNESS:   Tina MARKIE89.o.  female synchronous bilateral breast cancer [right stage III ER/PR positive;Her2 NEG; left stage I ER/PR positive; Her2- NEG] s/p adjuvant radiation; currently on Anastrazole is here for follow-up.  Patient denies any new lumps or bumps.  Appetite is good.  No weight loss.  Chronic fatigue.  She continues to be quite emotional however improved.   Review of Systems  Constitutional: Positive for malaise/fatigue. Negative for chills, diaphoresis, fever and weight loss.  HENT: Negative for nosebleeds and sore throat.   Eyes: Negative for double vision.  Respiratory: Negative for cough, hemoptysis, sputum production, shortness of breath and wheezing.   Cardiovascular: Negative for chest pain, palpitations, orthopnea and leg swelling.  Gastrointestinal: Negative for abdominal pain, blood in stool, constipation, diarrhea, heartburn, melena, nausea and vomiting.  Genitourinary: Negative for dysuria, frequency and urgency.  Musculoskeletal: Positive for back pain and joint pain.  Neurological: Negative for dizziness, tingling, focal weakness, weakness and headaches.  Endo/Heme/Allergies: Does not bruise/bleed easily.  Psychiatric/Behavioral: Negative for depression. The patient is not nervous/anxious and does not have insomnia.      MEDICAL HISTORY:  Past Medical History:  Diagnosis Date  . Arthritis    Gout  . Chronic kidney disease   . Diarrhea   . Hypercholesteremia   . Hypertension   . Neuropathy    feet and lower legs  . Seasonal allergies   . Skin cancer    face    SURGICAL HISTORY: Past Surgical History:  Procedure Laterality Date  . ABDOMINAL HYSTERECTOMY  2000  . APPENDECTOMY    . BREAST BIOPSY Right  09/19/2019   affirm bx of calcs UOQ, x marker, path pending  . BREAST  BIOPSY Right 09/19/2019   [place] bx of mass,heart marker, path pending  . BREAST BIOPSY Right 09/19/2019   [place] bx of LN, coil marker, path pending  . CATARACT EXTRACTION Left   . CATARACT EXTRACTION W/PHACO Right 10/24/2014   Procedure: CATARACT EXTRACTION PHACO AND INTRAOCULAR LENS PLACEMENT (IOC);  Surgeon: Leandrew Koyanagi, MD;  Location: [place];  Service: Ophthalmology;  Laterality: Right;  . ESOPHAGOGASTRODUODENOSCOPY N/A 03/07/2018   Procedure: ESOPHAGOGASTRODUODENOSCOPY (EGD);  Surgeon: Lin Landsman, MD;  Location: [facility] ENDOSCOPY;  Service: Gastroenterology;  Laterality: N/A;  . MASTECTOMY MODIFIED RADICAL Right 10/13/2019   Procedure: MASTECTOMY MODIFIED RADICAL;  Surgeon: Robert Bellow, MD;  Location: [facility] OR[facility];  Service: General;  Laterality: Right;  . RENAL ANGIOGRAPHY Right 04/11/2018   Procedure: RENAL ANGIOGRAPHY;  Surgeon: Algernon Huxley, MD;  Location: [place] [facility];  Service: Cardiovascular;  Laterality: Right;  . SIMPLE MASTECTOMY WITH AXILLARY SENTINEL NODE BIOPSY Left 10/13/2019   Procedure: SIMPLE MASTECTOMY TRUE CUT BIOPSY, SENTINEL NODE BIOPSY;  Surgeon: Robert Bellow, MD;  Location: [facility];  Service: General;  Laterality: Left;    SOCIAL HISTORY: Social History   Socioeconomic History  . Marital status: Widowed    Spouse name: Not on file  . Number of children: Not on file  . Years of education: Not on file  . Highest education level: Not on file  Occupational History  . Not on file  Tobacco Use  . Smoking status: Never Smoker  . Smokeless tobacco: Never Used  Substance and Sexual Activity  . Alcohol use: No  . Drug use: Never  . Sexual activity: Not on file  Other Topics Concern  . 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    Social Determinants of Health    Financial Resource Strain: Not on file  Food Insecurity: Not on file  Transportation Needs: Not on file  Physical Activity: Not on file  Stress: Not on file  Social Connections: Not on file  Intimate Partner Violence: Not on file    FAMILY HISTORY: Family History  Problem Relation Age of Onset  . Hypertension Mother   . Hypertension Father   . Stroke Father   . Breast cancer Daughter 62    ALLERGIES:  is allergic to penicillins and drug ingredient [zinc].  MEDICATIONS:  Current Outpatient Medications  Medication Sig Dispense Refill  . allopurinol (ZYLOPRIM) 100 MG tablet     . amLODipine (NORVASC) 5 MG tablet Take 2.5 mg by mouth daily.   1  . anastrozole (ARIMIDEX) 1 MG tablet Take 1 tablet (1 mg total) by mouth daily. DO NOT START if RASH is NOT better 90 tablet 3  . aspirin EC 81 MG tablet Take 81 mg by mouth daily.    . Baclofen 5 MG TABS Take 2.5 mg by mouth in the morning and at bedtime.     . Cholecalciferol (VITAMIN D PO) Take 1 tablet by mouth daily as needed (winter months).     . citalopram (CELEXA) 10 MG tablet Take 10 mg by mouth daily.     . clopidogrel (PLAVIX) 75 MG tablet TAKE 1 TABLET BY MOUTH EVERY DAY 30 tablet 1  . docusate sodium (COLACE) 100 MG capsule Take 100 mg by mouth daily as needed for mild constipation.    . DULoxetine (CYMBALTA) 60 MG capsule Take 60 mg by mouth daily.     Marland Kitchen  furosemide (LASIX) 20 MG tablet Take 20 mg by mouth daily.     Marland Kitchen gabapentin (NEURONTIN) 300 MG capsule Take 300 mg by mouth at bedtime.    . hydrALAZINE (APRESOLINE) 50 MG tablet Take 1 tablet (50 mg total) by mouth 2 (two) times daily. (Patient taking differently: Take 25 mg by mouth 2 (two) times daily.) 30 tablet 0  . HYDROcodone-acetaminophen (NORCO/VICODIN) 5-325 MG tablet Take 0.5-1 tablets by mouth every 4 (four) hours as needed for moderate pain. 12 tablet 0  . lidocaine-prilocaine (EMLA) cream Apply 1 application topically as needed (to breast).    Marland Kitchen loratadine  (CLARITIN) 10 MG tablet Take 10 mg by mouth daily as needed for allergies. AM    . metoprolol succinate (TOPROL-XL) 25 MG 24 hr tablet Take 37.5 mg by mouth at bedtime.     . Multiple Vitamins-Minerals (ICAPS PO) Take 1 capsule by mouth in the morning and at bedtime.     . polyethylene glycol (MIRALAX / GLYCOLAX) packet Take 17 g by mouth daily as needed. 60 each 0  . simvastatin (ZOCOR) 20 MG tablet Take 20 mg by mouth every evening. PM    . triamcinolone ointment (KENALOG) 0.5 % Apply 1 application topically daily. Apply to rash. 30 g 0   No current facility-administered medications for this visit.      Marland Kitchen  PHYSICAL EXAMINATION: ECOG PERFORMANCE STATUS: 0 - Asymptomatic  Vitals:   06/28/20 1412  BP: 132/67  Pulse: 71  Resp: 16  Temp: 98.8 F (37.1 C)  SpO2: 99%   Filed Weights   06/28/20 1412  Weight: 150 lb (68 kg)    Physical Exam Constitutional:      Comments: Walks with a cane.  Alone.  HENT:     Head: Normocephalic and atraumatic.     Mouth/Throat:     Pharynx: No oropharyngeal exudate.  Eyes:     Pupils: Pupils are equal, round, and reactive to light.  Cardiovascular:     Rate and Rhythm: Normal rate and regular rhythm.  Pulmonary:     Effort: Pulmonary effort is normal. No respiratory distress.     Breath sounds: Normal breath sounds. No wheezing.  Abdominal:     General: Bowel sounds are normal. There is no distension.     Palpations: Abdomen is soft. There is no mass.     Tenderness: There is no abdominal tenderness. There is no guarding or rebound.  Musculoskeletal:        General: No tenderness. Normal range of motion.     Cervical back: Normal range of motion and neck supple.  Skin:    General: Skin is warm.  Neurological:     Mental Status: She is alert and oriented to person, place, and time.  Psychiatric:        Mood and Affect: Affect normal.    LABORATORY DATA:  I have reviewed the data as listed Lab Results  Component Value Date   WBC  6.9 06/28/2020   HGB 12.3 06/28/2020   HCT 36.7 06/28/2020   MCV 95.8 06/28/2020   PLT 174 06/28/2020   Recent Labs    11/17/19 1014 12/20/19 0910 01/18/20 0935 02/26/20 0954 04/26/20 1428 06/28/20 1358  NA 142 140 139 142 139 141  K 4.6 4.9 4.7 4.6 4.0 4.9  CL 105 105 103 108 104 107  CO2 '27 25 26 26 24 23  ' GLUCOSE 139* 123* 127* 140* 157* 150*  BUN 48* 38* 47* 38* 40* 36*  CREATININE 1.88* 1.72* 1.83* 1.65* 1.50* 1.35*  CALCIUM 9.5 9.4 9.5 9.7 9.4 8.9  GFRNONAA 23* 26* 24* 27* 33* 37*  GFRAA 27* 30* 28*  --   --   --   PROT 7.4 7.4 8.1 7.8 7.6 7.1  ALBUMIN 4.1 4.3 4.4 4.5 4.2 4.0  AST '27 25 27 29 25 26  ' ALT '16 15 19 18 17 16  ' ALKPHOS 105 110 109 91 93 64  BILITOT 0.7 0.6 0.7 0.7 0.6 0.6          RADIOGRAPHIC STUDIES: I have personally reviewed the radiological images as listed and agreed with the findings in the report. No results found.  ASSESSMENT & PLAN:   Carcinoma of upper-inner quadrant of right breast in female, estrogen receptor positive ([place]) #Synchronous bilateral breast cancer-status post bilateral mastectomies-   Right Breast-stage III ER/PR positive HER-2 NEG; & Left breast cancer stage II ER/PR Pos; Her 2 NEG. Currently on anastrozole. STABLE- TM-elevated but STABLE. Awaiting TM today.  #Continue anastrozole.  Tolerating fairly well except for mild joint pains.  # Mild arthritic pain- G-1-2; recommend continue tylenol prn.    # Fatigue [s/p OT;Maureen]-   # CKD- Stage- IV- GFR 27 [Dr.Kolluru]; STABLE.   # Depressed: Patient is currently on Celexa; STABLE.    # NOV 2021- OsteoporosisT-scoreof -3.7.on ca+vit D. Prolia q 6 months [Dec 2021]  # DISPOSITION: # follow up in 2 months- MD; labs; cbc/cmp/ca 27-29;ca 15-3;cea;vit D levels-Dr.B   Addendum: I spoke to patient's daughter regarding improvement of the tumor Tina Curtis is a good sign.  However I did discuss the possible concerns for underlying metastatic disease/stage IV malignancy.   However, given her age/poor tolerance to therapies in general I think is reasonable to continue AI as long as her tumor markers are improving.  The daughter appreciates the information/thank you for the call.  Cc;Dr.Baboff   All questions were answered. The patient/family knows to call the clinic with any problems, questions or concerns.     Cammie Sickle, MD 07/09/2020 8:16 AM

## 2020-06-29 LAB — CANCER ANTIGEN 15-3: CA 15-3: 32.7 U/mL — ABNORMAL HIGH (ref 0.0–25.0)

## 2020-06-29 LAB — CANCER ANTIGEN 27.29: CA 27.29: 45.2 U/mL — ABNORMAL HIGH (ref 0.0–38.6)

## 2020-06-29 LAB — CEA: CEA: 4.5 ng/mL (ref 0.0–4.7)

## 2020-07-01 ENCOUNTER — Telehealth: Payer: Self-pay | Admitting: Internal Medicine

## 2020-07-01 NOTE — Telephone Encounter (Signed)
On 1/21-I spoke to patient's daughter [name] regarding patient's tumor markers improving.  Discussed concerns for micrometastatic disease/ stage IV.  However, as long as the tumors markers are improving I would recommend continued treatment with aromatase inhibitor.  Daughter understands; appreciates the phone call.

## 2020-07-17 ENCOUNTER — Other Ambulatory Visit: Payer: Self-pay | Admitting: *Deleted

## 2020-07-17 ENCOUNTER — Ambulatory Visit
Admission: RE | Admit: 2020-07-17 | Discharge: 2020-07-17 | Disposition: A | Discharge status: Home / Self Care | Payer: Medicare PPO | Source: Ambulatory Visit | Attending: Radiation Oncology | Admitting: Radiation Oncology

## 2020-07-17 ENCOUNTER — Encounter: Payer: Self-pay | Admitting: Radiation Oncology

## 2020-07-17 VITALS — BP 164/78 | HR 85 | Temp 97.5°F | Wt 149.2 lb

## 2020-07-17 DIAGNOSIS — C50911 Malignant neoplasm of unspecified site of right female breast: Secondary | ICD-10-CM

## 2020-07-17 DIAGNOSIS — M25511 Pain in right shoulder: Secondary | ICD-10-CM | POA: Diagnosis present

## 2020-07-17 NOTE — Progress Notes (Signed)
Radiation Oncology Follow up Note  Name: Tina Curtis   Date:   07/17/2020 MRN:  [id number] [name]: January 27, 1927    This 87 y.o. 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  REFERRING PROVIDER: BDerinda Late MD  HPI: Patient is a 87year old female completed bilateral axillary radiation for patient with known stage III right breast cancer status post bilateral mastectomies.  Seen today in routine follow-up she is doing fairly well she still has problems with right shoulder pain more like right scapular pain.  She has been working with physical therapy although that has been terminated.  She specifically denies cough bone pain or any new nodularities in the chest wall..  Patient is currently on Arimidex tolerating it well without side effect.  COMPLICATIONS OF TREATMENT: none  FOLLOW UP COMPLIANCE: keeps appointments   PHYSICAL EXAM:  BP (!) 164/78   Pulse 85   Temp (!) 97.5 F (36.4 C) (Tympanic)   Wt 149 lb 3.2 oz (67.7 kg)   BMI 25.61 kg/m  Patient status post bilateral mastectomies no evidence of chest wall mass or nodularity is noted.  No axillary or supraclavicular adenopathy is identified.  Well-developed well-nourished patient in NAD. HEENT reveals PERLA, EOMI, discs not visualized.  Oral cavity is clear. No oral mucosal lesions are identified. Neck is clear without evidence of cervical or supraclavicular adenopathy. Lungs are clear to A&P. Cardiac examination is essentially unremarkable with regular rate and rhythm without murmur rub or thrill. Abdomen is benign with no organomegaly or masses noted. Motor sensory and DTR levels are equal and symmetric in the upper and lower extremities. Cranial nerves II through XII are grossly intact. Proprioception is intact. No peripheral adenopathy or edema is identified. No  motor or sensory levels are noted. Crude visual fields are within normal range.  RADIOLOGY RESULTS: Plain films of her right shoulder and scapula are ordered  PLAN: I have ordered plain films of her right scapula and shoulder just to rule out any possibility of metastatic disease.  Otherwise have asked to see her back in 6 months for follow-up.  She continues close follow-up care of by medical oncology.  Will report separately if any of the plain films of her right shoulder scapular region or positive.  I would like to take this opportunity to thank you for allowing me to participate in the care of your patient..Noreene Filbert MD

## 2020-07-23 ENCOUNTER — Telehealth: Payer: Self-pay | Admitting: *Deleted

## 2020-07-23 NOTE — Telephone Encounter (Signed)
Patient called and left message that she was to call if Dr Baruch Gouty had not called her about her results of xrays done and she has not heard form anyone. She requests a return call

## 2020-07-24 NOTE — Telephone Encounter (Signed)
Returned call to patient, no answer.   VM left informing patient that per Dr. Baruch Gouty the xrays were normal and to follow up with her PCP regarding the shoulder pain.

## 2020-08-26 ENCOUNTER — Other Ambulatory Visit: Payer: Self-pay

## 2020-08-26 ENCOUNTER — Inpatient Hospital Stay (HOSPITAL_BASED_OUTPATIENT_CLINIC_OR_DEPARTMENT_OTHER): Payer: Medicare PPO | Admitting: Internal Medicine

## 2020-08-26 ENCOUNTER — Inpatient Hospital Stay: Payer: Medicare PPO | Attending: Internal Medicine

## 2020-08-26 ENCOUNTER — Encounter: Payer: Self-pay | Admitting: Internal Medicine

## 2020-08-26 ENCOUNTER — Telehealth: Payer: Self-pay | Admitting: Internal Medicine

## 2020-08-26 VITALS — BP 139/63 | HR 68 | Temp 97.2°F | Resp 18 | Wt 147.0 lb

## 2020-08-26 DIAGNOSIS — C50211 Malignant neoplasm of upper-inner quadrant of right female breast: Secondary | ICD-10-CM | POA: Insufficient documentation

## 2020-08-26 DIAGNOSIS — Z170 Estrogen receptor positive status [ER+]: Secondary | ICD-10-CM | POA: Insufficient documentation

## 2020-08-26 DIAGNOSIS — Z79899 Other long term (current) drug therapy: Secondary | ICD-10-CM | POA: Insufficient documentation

## 2020-08-26 DIAGNOSIS — Z7982 Long term (current) use of aspirin: Secondary | ICD-10-CM | POA: Insufficient documentation

## 2020-08-26 DIAGNOSIS — R5383 Other fatigue: Secondary | ICD-10-CM | POA: Diagnosis not present

## 2020-08-26 DIAGNOSIS — R978 Other abnormal tumor markers: Secondary | ICD-10-CM | POA: Insufficient documentation

## 2020-08-26 DIAGNOSIS — C7951 Secondary malignant neoplasm of bone: Secondary | ICD-10-CM

## 2020-08-26 DIAGNOSIS — N189 Chronic kidney disease, unspecified: Secondary | ICD-10-CM | POA: Insufficient documentation

## 2020-08-26 DIAGNOSIS — Z923 Personal history of irradiation: Secondary | ICD-10-CM | POA: Insufficient documentation

## 2020-08-26 DIAGNOSIS — R5382 Chronic fatigue, unspecified: Secondary | ICD-10-CM

## 2020-08-26 DIAGNOSIS — I129 Hypertensive chronic kidney disease with stage 1 through stage 4 chronic kidney disease, or unspecified chronic kidney disease: Secondary | ICD-10-CM | POA: Diagnosis not present

## 2020-08-26 DIAGNOSIS — Z79811 Long term (current) use of aromatase inhibitors: Secondary | ICD-10-CM | POA: Insufficient documentation

## 2020-08-26 DIAGNOSIS — M25511 Pain in right shoulder: Secondary | ICD-10-CM | POA: Insufficient documentation

## 2020-08-26 DIAGNOSIS — Z9013 Acquired absence of bilateral breasts and nipples: Secondary | ICD-10-CM | POA: Insufficient documentation

## 2020-08-26 DIAGNOSIS — E7800 Pure hypercholesterolemia, unspecified: Secondary | ICD-10-CM | POA: Insufficient documentation

## 2020-08-26 LAB — CBC WITH DIFFERENTIAL/PLATELET
Abs Immature Granulocytes: 0.03 10*3/uL (ref 0.00–0.07)
Basophils Absolute: 0.1 10*3/uL (ref 0.0–0.1)
Basophils Relative: 1 %
Eosinophils Absolute: 0.3 10*3/uL (ref 0.0–0.5)
Eosinophils Relative: 4 %
HCT: 38 % (ref 36.0–46.0)
Hemoglobin: 12.8 g/dL (ref 12.0–15.0)
Immature Granulocytes: 0 %
Lymphocytes Relative: 24 %
Lymphs Abs: 1.6 10*3/uL (ref 0.7–4.0)
MCH: 32 pg (ref 26.0–34.0)
MCHC: 33.7 g/dL (ref 30.0–36.0)
MCV: 95 fL (ref 80.0–100.0)
Monocytes Absolute: 0.8 10*3/uL (ref 0.1–1.0)
Monocytes Relative: 12 %
Neutro Abs: 4 10*3/uL (ref 1.7–7.7)
Neutrophils Relative %: 59 %
Platelets: 179 10*3/uL (ref 150–400)
RBC: 4 MIL/uL (ref 3.87–5.11)
RDW: 13.6 % (ref 11.5–15.5)
WBC: 6.7 10*3/uL (ref 4.0–10.5)
nRBC: 0 % (ref 0.0–0.2)

## 2020-08-26 LAB — COMPREHENSIVE METABOLIC PANEL
ALT: 18 U/L (ref 0–44)
AST: 27 U/L (ref 15–41)
Albumin: 4.3 g/dL (ref 3.5–5.0)
Alkaline Phosphatase: 65 U/L (ref 38–126)
Anion gap: 10 (ref 5–15)
BUN: 37 mg/dL — ABNORMAL HIGH (ref 8–23)
CO2: 26 mmol/L (ref 22–32)
Calcium: 9.6 mg/dL (ref 8.9–10.3)
Chloride: 105 mmol/L (ref 98–111)
Creatinine, Ser: 1.42 mg/dL — ABNORMAL HIGH (ref 0.44–1.00)
GFR, Estimated: 35 mL/min — ABNORMAL LOW (ref >60)
Glucose, Bld: 200 mg/dL — ABNORMAL HIGH (ref 70–99)
Potassium: 4.1 mmol/L (ref 3.5–5.1)
Sodium: 141 mmol/L (ref 135–145)
Total Bilirubin: 0.7 mg/dL (ref 0.3–1.2)
Total Protein: 7.5 g/dL (ref 6.5–8.1)

## 2020-08-26 LAB — VITAMIN D 25 HYDROXY (VIT D DEFICIENCY, FRACTURES): Vit D, 25-Hydroxy: 24.77 ng/mL — ABNORMAL LOW (ref 30–100)

## 2020-08-26 LAB — TSH: TSH: 2.573 u[IU]/mL (ref 0.350–4.500)

## 2020-08-26 NOTE — Progress Notes (Signed)
one Tina Curtis NOTE  Patient Care Team: Derinda Late, MD as PCP - General (Family Medicine) Cammie Sickle, MD as Consulting Physician (Internal Medicine) Bary Castilla Forest Gleason, MD as Consulting Physician (General Surgery) Theodore Demark, RN as Oncology Nurse Navigator Noreene Filbert, MD as Referring Physician (Radiation Oncology)  CHIEF COMPLAINTS/PURPOSE OF CONSULTATION: Breast cancer  #  Oncology History Overview Note  # June 2021-Right Breast- 3:00 right invasive mammary carcinoma with micropapillary features-status postmastectomy-[Dr. Burnett] stage III T2N3 [19/24 LN]; ER/PR positive HER-2 negative.   # Left breast-invasive mammary carcinoma-status post mastectomy [id number]; stage I-ER/PR positive HER-2 negative  #November 23, 2019-start adjuvant radiation bilateral [finished August 31]; no chemo;   #September 22nd 2021- anastrozole ----------------------------------------------------------------------------------------   A. RIGHT BREAST, 3:00; ULTRASOUND-GUIDED BIOPSY:  - INVASIVE MAMMARY CARCINOMA WITH MICROPAPILLARY FEATURES; Size of invasive carcinoma: 25m in this sample  Histologic grade of invasive carcinoma: Grade 2 ; ER/PR > 905: her 2- NEG  A. RIGHT BREAST, 3:00; ULTRASOUND-GUIDED BIOPSY: - INVASIVE MAMMARY CARCINOMA WITH MICROPAPILLARY FEATURES. 115min this sample. Grade 2. Lymphovascular invasion: Present.   B. LYMPH NODE, RIGHT AXILLARY; ULTRASOUND-GUIDED NEEDLE CORE BIOPSY: - METASTATIC MAMMARY CARCINOMA, 6 MM IN THIS SAMPLE.   C. RIGHT BREAST, LATERAL CALCIFICATIONS; STEREOTACTIC BIOPSY: - DUCTAL CARCINOMA IN SITU, INTERMEDIATE GRADE, WITH CALCIFICATIONS. -------------------------------------------------------------   # CKD- Stage III-IV [Dr.Kolluru]  # SURVIVORSHIP:   # GENETICS:   DIAGNOSIS:  Right breast cancer stage III;  left breast cancer-stage I GOALS: Cure  CURRENT/MOST RECENT THERAPY : Anastrozole    Carcinoma of  upper-inner quadrant of right breast in female, estrogen receptor positive ([facility] 09/27/2019 Initial Diagnosis   Carcinoma of upper-inner quadrant of right breast in female, estrogen receptor positive ([facility]     HISTORY OF PRESENTING ILLNESS:   Tina Curtis.o.  female synchronous bilateral breast cancer [right stage III ER/PR positive;Her2 NEG; left stage I ER/PR positive; Her2- NEG] s/p adjuvant radiation; currently on Anastrazole is here for follow-up.  Patient complains of worsening pain in the right shoulder especially with movement.  Getting worse over the last few weeks.  Denies any trauma.  Patient has been taking Tylenol 2 pills at nighttime.  Currently not helping.  Continues to have chronic worsening fatigue.  She denies any overt signs and symptoms of depression.  Review of Systems  Constitutional: Positive for malaise/fatigue. Negative for chills, diaphoresis, fever and weight loss.  HENT: Negative for nosebleeds and sore throat.   Eyes: Negative for double vision.  Respiratory: Negative for cough, hemoptysis, sputum production, shortness of breath and wheezing.   Cardiovascular: Negative for chest pain, palpitations, orthopnea and leg swelling.  Gastrointestinal: Negative for abdominal pain, blood in stool, constipation, diarrhea, heartburn, melena, nausea and vomiting.  Genitourinary: Negative for dysuria, frequency and urgency.  Musculoskeletal: Positive for back pain and joint pain.  Neurological: Negative for dizziness, tingling, focal weakness, weakness and headaches.  Endo/Heme/Allergies: Does not bruise/bleed easily.  Psychiatric/Behavioral: Negative for depression. The patient is not nervous/anxious and does not have insomnia.      MEDICAL HISTORY:  Past Medical History:  Diagnosis Date  . Arthritis    Gout  . Chronic kidney disease   . Diarrhea   . Hypercholesteremia   . Hypertension   . Neuropathy    feet and lower legs  . Seasonal allergies   .  Skin cancer    face    SURGICAL HISTORY: Past Surgical History:  Procedure Laterality Date  . ABDOMINAL HYSTERECTOMY  2000  . APPENDECTOMY    . BREAST BIOPSY Right 09/19/2019   affirm bx of calcs UOQ, x marker, path pending  . BREAST BIOPSY Right 09/19/2019   [place] bx of mass,heart marker, path pending  . BREAST BIOPSY Right 09/19/2019   [place] bx of LN, coil marker, path pending  . CATARACT EXTRACTION Left   . CATARACT EXTRACTION W/PHACO Right 10/24/2014   Procedure: CATARACT EXTRACTION PHACO AND INTRAOCULAR LENS PLACEMENT (IOC);  Surgeon: Leandrew Koyanagi, MD;  Location: [place];  Service: Ophthalmology;  Laterality: Right;  . ESOPHAGOGASTRODUODENOSCOPY N/A 03/07/2018   Procedure: ESOPHAGOGASTRODUODENOSCOPY (EGD);  Surgeon: Lin Landsman, MD;  Location: [facility] ENDOSCOPY;  Service: Gastroenterology;  Laterality: N/A;  . MASTECTOMY MODIFIED RADICAL Right 10/13/2019   Procedure: MASTECTOMY MODIFIED RADICAL;  Surgeon: Robert Bellow, MD;  Location: [facility] ORS;  Service: General;  Laterality: Right;  . RENAL ANGIOGRAPHY Right 04/11/2018   Procedure: RENAL ANGIOGRAPHY;  Surgeon: Algernon Huxley, MD;  Location: [place] CV LAB;  Service: Cardiovascular;  Laterality: Right;  . SIMPLE MASTECTOMY WITH AXILLARY SENTINEL NODE BIOPSY Left 10/13/2019   Procedure: SIMPLE MASTECTOMY TRUE CUT BIOPSY, SENTINEL NODE BIOPSY;  Surgeon: Robert Bellow, MD;  Location: [facility] ORS;  Service: General;  Laterality: Left;    SOCIAL HISTORY: Social History   Socioeconomic History  . Marital status: Widowed    Spouse name: Not on file  . Number of children: Not on file  . Years of education: Not on file  . Highest education level: Not on file  Occupational History  . Not on file  Tobacco Use  . Smoking status: Never Smoker  . Smokeless tobacco: Never Used  Substance and Sexual Activity  . Alcohol use: No  . Drug use: Never  . Sexual activity: Not on file  Other Topics Concern  . Not on  file  Social History Narrative   2 daughters; lives in [place]; Pharmacist, 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    Social Determinants of Health   Financial Resource Strain: Not on file  Food Insecurity: Not on file  Transportation Needs: Not on file  Physical Activity: Not on file  Stress: Not on file  Social Connections: Not on file  Intimate Partner Violence: Not on file    FAMILY HISTORY: Family History  Problem Relation Age of Onset  . Hypertension Mother   . Hypertension Father   . Stroke Father   . Breast cancer Daughter 36    ALLERGIES:  is allergic to penicillins and drug ingredient [zinc].  MEDICATIONS:  Current Outpatient Medications  Medication Sig Dispense Refill  . allopurinol (ZYLOPRIM) 100 MG tablet     . amLODipine (NORVASC) 5 MG tablet Take 2.5 mg by mouth daily.   1  . anastrozole (ARIMIDEX) 1 MG tablet Take 1 tablet (1 mg total) by mouth daily. DO NOT START if RASH is NOT better 90 tablet 3  . aspirin EC 81 MG tablet Take 81 mg by mouth daily.    . Baclofen 5 MG TABS Take 2.5 mg by mouth in the morning and at bedtime.     . citalopram (CELEXA) 10 MG tablet Take 10 mg by mouth daily.     . clopidogrel (PLAVIX) 75 MG tablet TAKE 1 TABLET BY MOUTH EVERY DAY 30 tablet 1  . docusate sodium (COLACE) 100 MG capsule Take 100 mg by mouth daily as needed for mild constipation.    . DULoxetine (CYMBALTA) 60 MG capsule Take 60 mg  by mouth daily.     . ergocalciferol (VITAMIN D2) 1.25 MG (50000 UT) capsule Take 1 capsule by mouth once a week.    . furosemide (LASIX) 20 MG tablet Take 20 mg by mouth daily.     Marland Kitchen gabapentin (NEURONTIN) 300 MG capsule Take 300 mg by mouth at bedtime.    . hydrALAZINE (APRESOLINE) 50 MG tablet Take 1 tablet (50 mg total) by mouth 2 (two) times daily. (Patient taking differently: Take 25 mg by mouth 2 (two) times daily.) 30 tablet 0  . loratadine (CLARITIN) 10 MG tablet Take 10 mg by mouth daily as needed for  allergies. AM    . metoprolol succinate (TOPROL-XL) 25 MG 24 hr tablet Take 37.5 mg by mouth at bedtime.     . simvastatin (ZOCOR) 20 MG tablet Take 20 mg by mouth every evening. PM     No current facility-administered medications for this visit.      Marland Kitchen  PHYSICAL EXAMINATION: ECOG PERFORMANCE STATUS: 0 - Asymptomatic  Vitals:   08/26/20 1442  BP: 139/63  Pulse: 68  Resp: 18  Temp: (!) 97.2 F (36.2 C)   Filed Weights   08/26/20 1442  Weight: 147 lb (66.7 kg)    Physical Exam Constitutional:      Comments: Walks with a rolling walker.  Alone.  HENT:     Head: Normocephalic and atraumatic.     Mouth/Throat:     Pharynx: No oropharyngeal exudate.  Eyes:     Pupils: Pupils are equal, round, and reactive to light.  Cardiovascular:     Rate and Rhythm: Normal rate and regular rhythm.  Pulmonary:     Effort: Pulmonary effort is normal. No respiratory distress.     Breath sounds: Normal breath sounds. No wheezing.  Abdominal:     General: Bowel sounds are normal. There is no distension.     Palpations: Abdomen is soft. There is no mass.     Tenderness: There is no abdominal tenderness. There is no guarding or rebound.  Musculoskeletal:        General: No tenderness. Normal range of motion.     Cervical back: Normal range of motion and neck supple.  Skin:    General: Skin is warm.  Neurological:     Mental Status: She is alert and oriented to person, place, and time.  Psychiatric:        Mood and Affect: Affect normal.    LABORATORY DATA:  I have reviewed the data as listed Lab Results  Component Value Date   WBC 6.7 08/26/2020   HGB 12.8 08/26/2020   HCT 38.0 08/26/2020   MCV 95.0 08/26/2020   PLT 179 08/26/2020   Recent Labs    11/17/19 1014 12/20/19 0910 01/18/20 0935 02/26/20 0954 04/26/20 1428 06/28/20 1358  NA 142 140 139 142 139 141  K 4.6 4.9 4.7 4.6 4.0 4.9  CL 105 105 103 108 104 107  CO2 _0 GLUCOSE 139* 123* 127* 140* 157*  150*  BUN 48* 38* 47* 38* 40* 36*  CREATININE 1.88* 1.72* 1.83* 1.65* 1.50* 1.35*  CALCIUM 9.5 9.4 9.5 9.7 9.4 8.9  GFRNONAA 23* 26* 24* 27* 33* 37*  GFRAA 27* 30* 28*  --   --   --   PROT 7.4 7.4 8.1 7.8 7.6 7.1  ALBUMIN 4.1 4.3 4.4 4.5 4.2 4.0  AST _1 ALT _2 17  16  ALKPHOS 105 110 109 91 93 64  BILITOT 0.7 0.6 0.7 0.7 0.6 0.6          RADIOGRAPHIC STUDIES: I have personally reviewed the radiological images as listed and agreed with the findings in the report. No results found.  ASSESSMENT & PLAN:   Carcinoma of upper-inner quadrant of right breast in female, estrogen receptor positive ([place]) There #Synchronous bilateral breast cancer-status post bilateral mastectomies- Right Breast-stage III ER/PR positive HER-2 NEG; & Left breast cancer stage II ER/PR Pos; Her 2 NEG. Currently on anastrozole. STABLE; TM-elevated but STABLE. Awaiting TM today.  # Continue anastrozole.  Tolerating fairly well except for mild joint pains-except worsening shoulder pain see below.  # Right shoulder pain/scapular pain- worse- on tylenol. Check bone scan ASAP.  Also waiting on vitamin D levels.  # Mild arthritic pain- G-1-2; recommend continue tylenol prn.    # Fatigue [s/p OT;Maureen]-   # CKD- Stage- IV- GFR 27 [Dr.Kolluru]; STABLE.   # NOV 2021- OsteoporosisT-scoreof -3.7.on ca+vit D. Prolia q 6 months [Dec 2021]  # DISPOSITION: check TSH # bone scan ASAP # follow up TBD-Dr.B  Cc;Dr.Baboff   All questions were answered. The patient/family knows to call the clinic with any problems, questions or concerns.     Cammie Sickle, MD 08/26/2020 3:20 PM

## 2020-08-26 NOTE — Assessment & Plan Note (Addendum)
There #Synchronous bilateral breast cancer-status post bilateral mastectomies- Right Breast-stage III ER/PR positive HER-2 NEG; & Left breast cancer stage II ER/PR Pos; Her 2 NEG. Currently on anastrozole. STABLE; TM-elevated but STABLE. Awaiting TM today.  # Continue anastrozole.  Tolerating fairly well except for mild joint pains-except worsening shoulder pain see below.  # Right shoulder pain/scapular pain- worse- on tylenol. Check bone scan ASAP.  Also waiting on vitamin D levels.  # Mild arthritic pain- G-1-2; recommend continue tylenol prn.    # Fatigue [s/p OT;Maureen]-   # CKD- Stage- IV- GFR 27 [Dr.Kolluru]; STABLE.   # NOV 2021- OsteoporosisT-scoreof -3.7.on ca+vit D. Prolia q 6 months [Dec 2021]  # DISPOSITION: check TSH # bone scan ASAP # follow up TBD-Dr.B  Cc;Dr.Baboff

## 2020-08-26 NOTE — Telephone Encounter (Signed)
On 4/18- I spoke to pt's daughter, karen-regarding patient's symptoms of worsening right shoulder pain.  Plan for bone scan for further evaluation.  We will keep updated.  GB

## 2020-08-27 ENCOUNTER — Other Ambulatory Visit: Payer: Self-pay

## 2020-08-27 ENCOUNTER — Encounter (INDEPENDENT_AMBULATORY_CARE_PROVIDER_SITE_OTHER): Payer: Self-pay | Admitting: Vascular Surgery

## 2020-08-27 ENCOUNTER — Ambulatory Visit (INDEPENDENT_AMBULATORY_CARE_PROVIDER_SITE_OTHER): Payer: Medicare PPO | Admitting: Vascular Surgery

## 2020-08-27 ENCOUNTER — Ambulatory Visit (INDEPENDENT_AMBULATORY_CARE_PROVIDER_SITE_OTHER): Payer: Medicare PPO

## 2020-08-27 VITALS — BP 132/67 | HR 78 | Resp 16 | Wt 149.0 lb

## 2020-08-27 DIAGNOSIS — I701 Atherosclerosis of renal artery: Secondary | ICD-10-CM

## 2020-08-27 DIAGNOSIS — E7800 Pure hypercholesterolemia, unspecified: Secondary | ICD-10-CM | POA: Diagnosis not present

## 2020-08-27 DIAGNOSIS — I150 Renovascular hypertension: Secondary | ICD-10-CM | POA: Diagnosis not present

## 2020-08-27 LAB — CANCER ANTIGEN 15-3: CA 15-3: 32 U/mL — ABNORMAL HIGH (ref 0.0–25.0)

## 2020-08-27 LAB — CEA: CEA: 4.3 ng/mL (ref 0.0–4.7)

## 2020-08-27 LAB — CANCER ANTIGEN 27.29: CA 27.29: 36.8 U/mL (ref 0.0–38.6)

## 2020-08-27 NOTE — Assessment & Plan Note (Signed)
Duplex today shows a patent right renal artery stent with a known solitary right kidney.  Blood pressure control has been good.  We will continue to follow this on 44-month intervals given her high risk status with a solitary kidney.  No change in her medical regimen.

## 2020-08-27 NOTE — Progress Notes (Signed)
MRN : [id number]  Tina Curtis is a 87 y.o. (Mar 01, 1927) female who presents with chief complaint of  Chief Complaint  Patient presents with  . Follow-up    6 month ultrasound follow up  .  History of Present Illness: Patient returns today in follow up of her renal artery stenosis.  Her biggest issue now is of some bone pain in her treatment for breast cancer.  She follows with oncology and seems to be doing okay with this.  She has developed some neuropathy in her legs.  Her blood pressure remains good and her kidney function has stayed stable as far she knows.  No new complaints today.  Duplex today shows a patent right renal artery stent with a known solitary right kidney.  Current Outpatient Medications  Medication Sig Dispense Refill  . allopurinol (ZYLOPRIM) 100 MG tablet     . amLODipine (NORVASC) 5 MG tablet Take 2.5 mg by mouth daily.   1  . anastrozole (ARIMIDEX) 1 MG tablet Take 1 tablet (1 mg total) by mouth daily. DO NOT START if RASH is NOT better 90 tablet 3  . aspirin EC 81 MG tablet Take 81 mg by mouth daily.    . Baclofen 5 MG TABS Take 2.5 mg by mouth in the morning and at bedtime.     . citalopram (CELEXA) 10 MG tablet Take 10 mg by mouth daily.     . clopidogrel (PLAVIX) 75 MG tablet TAKE 1 TABLET BY MOUTH EVERY DAY 30 tablet 1  . docusate sodium (COLACE) 100 MG capsule Take 100 mg by mouth daily as needed for mild constipation.    . DULoxetine (CYMBALTA) 60 MG capsule Take 60 mg by mouth daily.     . ergocalciferol (VITAMIN D2) 1.25 MG (50000 UT) capsule Take 1 capsule by mouth once a week.    . furosemide (LASIX) 20 MG tablet Take 20 mg by mouth daily.     Marland Kitchen gabapentin (NEURONTIN) 300 MG capsule Take 300 mg by mouth at bedtime.    . hydrALAZINE (APRESOLINE) 50 MG tablet Take 1 tablet (50 mg total) by mouth 2 (two) times daily. (Patient taking differently: Take 25 mg by mouth 2 (two) times daily.) 30 tablet 0  . loratadine (CLARITIN) 10 MG tablet Take 10 mg by  mouth daily as needed for allergies. AM    . metoprolol succinate (TOPROL-XL) 25 MG 24 hr tablet Take 37.5 mg by mouth at bedtime.     . simvastatin (ZOCOR) 20 MG tablet Take 20 mg by mouth every evening. PM     No current facility-administered medications for this visit.    Past Medical History:  Diagnosis Date  . Arthritis    Gout  . Chronic kidney disease   . Diarrhea   . Hypercholesteremia   . Hypertension   . Neuropathy    feet and lower legs  . Seasonal allergies   . Skin cancer    face    Past Surgical History:  Procedure Laterality Date  . ABDOMINAL HYSTERECTOMY  2000  . APPENDECTOMY    . BREAST BIOPSY Right 09/19/2019   affirm bx of calcs UOQ, x marker, path pending  . BREAST BIOPSY Right 09/19/2019   [place] bx of mass,heart marker, path pending  . BREAST BIOPSY Right 09/19/2019   [place] bx of LN, coil marker, path pending  . CATARACT EXTRACTION Left   . CATARACT EXTRACTION W/PHACO Right 10/24/2014   Procedure: CATARACT EXTRACTION PHACO AND INTRAOCULAR  LENS PLACEMENT (IOC);  Surgeon: Leandrew Koyanagi, MD;  Location: [facility];  Service: Ophthalmology;  Laterality: Right;  . ESOPHAGOGASTRODUODENOSCOPY N/A 03/07/2018   Procedure: ESOPHAGOGASTRODUODENOSCOPY (EGD);  Surgeon: Lin Landsman, MD;  Location: [facility] ENDOSCOPY;  Service: Gastroenterology;  Laterality: N/A;  . MASTECTOMY MODIFIED RADICAL Right 10/13/2019   Procedure: MASTECTOMY MODIFIED RADICAL;  Surgeon: Robert Bellow, MD;  Location: [facility];  Service: General;  Laterality: Right;  . RENAL ANGIOGRAPHY Right 04/11/2018   Procedure: RENAL ANGIOGRAPHY;  Surgeon: Algernon Huxley, MD;  Location: [facility];  Service: Cardiovascular;  Laterality: Right;  . SIMPLE MASTECTOMY WITH AXILLARY SENTINEL NODE BIOPSY Left 10/13/2019   Procedure: SIMPLE MASTECTOMY TRUE CUT BIOPSY, SENTINEL NODE BIOPSY;  Surgeon: Robert Bellow, MD;  Location: [facility];  Service: General;  Laterality: Left;     Social  History   Tobacco Use  . Smoking status: Never Smoker  . Smokeless tobacco: Never Used  Substance Use Topics  . Alcohol use: No  . Drug use: Never       Family History  Problem Relation Age of Onset  . Hypertension Mother   . Hypertension Father   . Stroke Father   . Breast cancer Daughter 13     Allergies  Allergen Reactions  . Penicillins Anaphylaxis  . Drug Ingredient [Zinc]       REVIEW OF SYSTEMS(Negative unless checked)  Constitutional: [] ?????Weight loss[] ?????Fever[] ?????Chills Cardiac:[] ?????Chest pain[] ?????Chest pressure[] ?????Palpitations [] ?????Shortness of breath when laying flat [] ?????Shortness of breath at rest [] ?????Shortness of breath with exertion. Vascular: [] ?????Pain in legs with walking[] ?????Pain in legsat rest[] ?????Pain in legs when laying flat [] ?????Claudication [] ?????Pain in feet when walking [] ?????Pain in feet at rest [] ?????Pain in feet when laying flat [] ?????History of DVT [] ?????Phlebitis [] ?????Swelling in legs [] ?????Varicose veins [] ?????Non-healing ulcers Pulmonary: [] ?????Uses home oxygen [] ?????Productive cough[] ?????Hemoptysis [] ?????Wheeze [] ?????COPD [] ?????Asthma Neurologic: [] ?????Dizziness [] ?????Blackouts [] ?????Seizures [] ?????History of stroke [] ?????History of TIA[] ?????Aphasia [] ?????Temporary blindness[] ?????Dysphagia [] ?????Weaknessor numbness in arms [x] ?????Weakness or numbnessin legs Musculoskeletal: [x] ?????Arthritis [] ?????Joint swelling [x] ?????Joint pain [] ?????Low back pain Hematologic:[] ?????Easy bruising[] ?????Easy bleeding [] ?????Hypercoagulable state [] ?????Anemic [] ?????Hepatitis Gastrointestinal:[] ?????Blood in stool[] ?????Vomiting blood[] ?????Gastroesophageal reflux/heartburn[] ?????Abdominal pain Genitourinary: [x] ?????Chronic kidney disease [x] ?????Difficulturination [] ?????Frequenturination  [] ?????Burning with urination[] ?????Hematuria Skin: [] ?????Rashes [] ?????Ulcers [] ?????Wounds Psychological: [] ?????History of anxiety[] ?????History of major depression.  Physical Examination  BP 132/67 (BP Location: Left Arm)   Pulse 78   Resp 16   Wt 149 lb (67.6 kg)   BMI 25.58 kg/m  Gen:  WD/WN, NAD.  Appears far younger than stated age. Head: [place]/AT, No temporalis wasting. Ear/Nose/Throat: Hearing grossly intact, nares w/o erythema or drainage Eyes: Conjunctiva clear. Sclera non-icteric Neck: Supple.  Trachea midline Pulmonary:  Good air movement, no use of accessory muscles.  Cardiac: RRR, no JVD Vascular:  Vessel Right Left  Radial Palpable Palpable                          PT Palpable Palpable  DP Palpable Palpable   Gastrointestinal: soft, non-tender/non-distended. No guarding/reflex.  Musculoskeletal: M/S 5/5 throughout.  No deformity or atrophy.  Walks with a walker.  No edema. Neurologic: Sensation grossly intact in extremities.  Symmetrical.  Speech is fluent.  Psychiatric: Judgment intact, Mood & affect appropriate for pt's clinical situation. Dermatologic: No rashes or ulcers noted.  No cellulitis or open wounds.       Labs Recent Results (from the past 2160 hour(s))  Comprehensive metabolic panel     Status: Abnormal   Collection Time: 06/28/20  1:58 PM  Result Value Ref Range  Sodium 141 135 - 145 mmol/L   Potassium 4.9 3.5 - 5.1 mmol/L   Chloride 107 98 - 111 mmol/L   CO2 23 22 - 32 mmol/L   Glucose, Bld 150 (H) 70 - 99 mg/dL    Comment: Glucose reference range applies only to samples taken after fasting for at least 8 hours.   BUN 36 (H) 8 - 23 mg/dL   Creatinine, Ser 1.35 (H) 0.44 - 1.00 mg/dL   Calcium 8.9 8.9 - 10.3 mg/dL   Total Protein 7.1 6.5 - 8.1 g/dL   Albumin 4.0 3.5 - 5.0 g/dL   AST 26 15 - 41 U/L   ALT 16 0 - 44 U/L   Alkaline Phosphatase 64 38 - 126 U/L   Total Bilirubin 0.6 0.3 - 1.2 mg/dL   GFR, Estimated 37  (L) >60 mL/min    Comment: (NOTE) Calculated using the CKD-EPI Creatinine Equation (2021)    Anion gap 11 5 - 15    Comment: Performed at [facility], [address] [place], [place] [address]  CBC with Differential/Platelet     Status: Abnormal   Collection Time: 06/28/20  1:58 PM  Result Value Ref Range   WBC 6.9 4.0 - 10.5 K/uL   RBC 3.83 (L) 3.87 - 5.11 MIL/uL   Hemoglobin 12.3 12.0 - 15.0 g/dL   HCT 36.7 36.0 - 46.0 %   MCV 95.8 80.0 - 100.0 fL   MCH 32.1 26.0 - 34.0 pg   MCHC 33.5 30.0 - 36.0 g/dL   RDW 14.1 11.5 - 15.5 %   Platelets 174 150 - 400 K/uL   nRBC 0.0 0.0 - 0.2 %   Neutrophils Relative % 64 %   Neutro Abs 4.4 1.7 - 7.7 K/uL   Lymphocytes Relative 20 %   Lymphs Abs 1.4 0.7 - 4.0 K/uL   Monocytes Relative 11 %   Monocytes Absolute 0.8 0.1 - 1.0 K/uL   Eosinophils Relative 4 %   Eosinophils Absolute 0.3 0.0 - 0.5 K/uL   Basophils Relative 1 %   Basophils Absolute 0.1 0.0 - 0.1 K/uL   Immature Granulocytes 0 %   Abs Immature Granulocytes 0.03 0.00 - 0.07 K/uL    Comment: Performed at [facility], [address] [place], [place] [address]  CEA     Status: None   Collection Time: 06/28/20  1:58 PM  Result Value Ref Range   CEA 4.5 0.0 - 4.7 ng/mL    Comment: (NOTE)                             Nonsmokers          <3.9                             Smokers             <5.6 Roche [employer]ostics Electrochemiluminescence Immunoassay (ECLIA) Values obtained with different assay methods or kits cannot be used interchangeably.  Results cannot be interpreted as absolute evidence of the presence or absence of malignant disease. Performed At: [facility] [place], [place] [id number] Rush Farmer MD VO:[id number]   Cancer antigen 15-3     Status: Abnormal   Collection Time: 06/28/20  1:58 PM  Result Value Ref Range   CA 15-3 32.7 (H) 0.0 - 25.0 U/mL    Comment: (NOTE) Roche [employer]ostics Electrochemiluminescence  Immunoassay  (ECLIA) Values obtained with different assay methods or kits cannot be used interchangeably.  Results cannot be interpreted as absolute evidence of the presence or absence of malignant disease. Performed At: [facility] [place], [place] [id number] Rush Farmer MD WU:[id number]   Cancer antigen 27.29     Status: Abnormal   Collection Time: 06/28/20  1:58 PM  Result Value Ref Range   CA 27.29 45.2 (H) 0.0 - 38.6 U/mL    Comment: (NOTE) Siemens Centaur Immunochemiluminometric Methodology [facility]) Values obtained with different assay methods or kits cannot be used interchangeably. Results cannot be interpreted as absolute evidence of the presence or absence of malignant disease. Performed At: [facility] [place], [place] [id number] Rush Farmer MD VH:[id number]   Vitamin D 25 hydroxy     Status: Abnormal   Collection Time: 08/26/20  2:25 PM  Result Value Ref Range   Vit D, 25-Hydroxy 24.77 (L) 30 - 100 ng/mL    Comment: (NOTE) Vitamin D deficiency has been defined by the Institute of Medicine  and an Endocrine Society practice guideline as a level of serum 25-OH  vitamin D less than 20 ng/mL (1,2). The Endocrine Society went on to  further define vitamin D insufficiency as a level between 21 and 29  ng/mL (2).  1. IOM (Institute of Medicine). 2010. Dietary reference intakes for  calcium and D. [place]: The [employer]. 2. Holick MF, Binkley [place], Bischoff-Ferrari HA, et al. Evaluation,  treatment, and prevention of vitamin D deficiency: an Endocrine  Society clinical practice guideline, JCEM. 2011 Jul; 96(7): 1911-30.  Performed at [facility], [place] [address], [place], [place] [address]   CEA     Status: None   Collection Time: 08/26/20  2:25 PM  Result Value Ref Range   CEA 4.3 0.0 - 4.7 ng/mL    Comment: (NOTE)                             Nonsmokers          <3.9                             Smokers              <5.6 Roche [employer]ostics Electrochemiluminescence Immunoassay (ECLIA) Values obtained with different assay methods or kits cannot be used interchangeably.  Results cannot be interpreted as absolute evidence of the presence or absence of malignant disease. Performed At: [facility] [place], [place] [id number] Rush Farmer MD GU:[id number]   Cancer antigen 15-3     Status: Abnormal   Collection Time: 08/26/20  2:25 PM  Result Value Ref Range   CA 15-3 32.0 (H) 0.0 - 25.0 U/mL    Comment: (NOTE) Roche Diagnostics Electrochemiluminescence Immunoassay (ECLIA) Values obtained with different assay methods or kits cannot be used interchangeably.  Results cannot be interpreted as absolute evidence of the presence or absence of malignant disease. Performed At: [facility] [place], [place] [id number] Rush Farmer MD PI:[id number]   Cancer antigen 27.29     Status: None   Collection Time: 08/26/20  2:25 PM  Result Value Ref Range   CA 27.29 36.8 0.0 - 38.6 U/mL    Comment: (NOTE) Siemens Centaur Immunochemiluminometric Methodology [facility]) Values obtained with different assay methods or kits cannot be used interchangeably. Results  cannot be interpreted as absolute evidence of the presence or absence of malignant disease. Performed At: [facility] [place], [place] [id number] Rush Farmer MD TK:[id number]   Comprehensive metabolic panel     Status: Abnormal   Collection Time: 08/26/20  2:25 PM  Result Value Ref Range   Sodium 141 135 - 145 mmol/L   Potassium 4.1 3.5 - 5.1 mmol/L   Chloride 105 98 - 111 mmol/L   CO2 26 22 - 32 mmol/L   Glucose, Bld 200 (H) 70 - 99 mg/dL    Comment: Glucose reference range applies only to samples taken after fasting for at least 8 hours.   BUN 37 (H) 8 - 23 mg/dL   Creatinine, Ser 1.42 (H) 0.44 - 1.00 mg/dL   Calcium 9.6 8.9 - 10.3 mg/dL   Total Protein 7.5 6.5 - 8.1  g/dL   Albumin 4.3 3.5 - 5.0 g/dL   AST 27 15 - 41 U/L   ALT 18 0 - 44 U/L   Alkaline Phosphatase 65 38 - 126 U/L   Total Bilirubin 0.7 0.3 - 1.2 mg/dL   GFR, Estimated 35 (L) >60 mL/min    Comment: (NOTE) Calculated using the CKD-EPI Creatinine Equation (2021)    Anion gap 10 5 - 15    Comment: Performed at [facility], [address] [place], [place] [address]  CBC with Differential/Platelet     Status: None   Collection Time: 08/26/20  2:25 PM  Result Value Ref Range   WBC 6.7 4.0 - 10.5 K/uL   RBC 4.00 3.87 - 5.11 MIL/uL   Hemoglobin 12.8 12.0 - 15.0 g/dL   HCT 38.0 36.0 - 46.0 %   MCV 95.0 80.0 - 100.0 fL   MCH 32.0 26.0 - 34.0 pg   MCHC 33.7 30.0 - 36.0 g/dL   RDW 13.6 11.5 - 15.5 %   Platelets 179 150 - 400 K/uL   nRBC 0.0 0.0 - 0.2 %   Neutrophils Relative % 59 %   Neutro Abs 4.0 1.7 - 7.7 K/uL   Lymphocytes Relative 24 %   Lymphs Abs 1.6 0.7 - 4.0 K/uL   Monocytes Relative 12 %   Monocytes Absolute 0.8 0.1 - 1.0 K/uL   Eosinophils Relative 4 %   Eosinophils Absolute 0.3 0.0 - 0.5 K/uL   Basophils Relative 1 %   Basophils Absolute 0.1 0.0 - 0.1 K/uL   Immature Granulocytes 0 %   Abs Immature Granulocytes 0.03 0.00 - 0.07 K/uL    Comment: Performed at [facility] [address] [place], [place] [address]  TSH     Status: None   Collection Time: 08/26/20  2:25 PM  Result Value Ref Range   TSH 2.573 0.350 - 4.500 uIU/mL    Comment: Performed by a 3rd Generation assay with a functional sensitivity of <=0.01 uIU/mL. Performed at [facility], [address], [place], [place] [address]     Radiology No results found.  Assessment/Plan Pure hypercholesterolemia lipid control important in reducing the progression of atherosclerotic disease. Continue statin therapy  Renovascular hypertension Blood pressure control markedly improved after intervention  Atherosclerosis of renal artery (HCC) Duplex today shows a patent right renal  artery stent with a known solitary right kidney.  Blood pressure control has been good.  We will continue to follow this on 43-month intervals given her high risk status with a solitary kidney.  No change in her medical regimen.  Leotis Pain, MD  08/27/2020 11:06 AM    This note was created with Dragon medical transcription system.  Any errors from dictation are purely unintentional

## 2020-09-02 ENCOUNTER — Encounter
Admission: RE | Admit: 2020-09-02 | Discharge: 2020-09-02 | Disposition: A | Discharge status: Home / Self Care | Payer: Medicare PPO | Source: Ambulatory Visit | Attending: Internal Medicine | Admitting: Internal Medicine

## 2020-09-02 ENCOUNTER — Other Ambulatory Visit: Payer: Self-pay

## 2020-09-02 DIAGNOSIS — C50211 Malignant neoplasm of upper-inner quadrant of right female breast: Secondary | ICD-10-CM | POA: Insufficient documentation

## 2020-09-02 DIAGNOSIS — Z170 Estrogen receptor positive status [ER+]: Secondary | ICD-10-CM | POA: Insufficient documentation

## 2020-09-02 DIAGNOSIS — C7951 Secondary malignant neoplasm of bone: Secondary | ICD-10-CM

## 2020-09-02 MED ORDER — TECHNETIUM TC 99M MEDRONATE IV KIT
20.0000 | PACK | Freq: Once | INTRAVENOUS | Status: AC | PRN
  Administered 2020-09-02: 20.855 via INTRAVENOUS

## 2020-09-04 ENCOUNTER — Telehealth: Payer: Self-pay | Admitting: Internal Medicine

## 2020-09-04 NOTE — Telephone Encounter (Signed)
On 4/26-I left a voicemail for the patient regarding negative results of bone scan for malignancy.  Spoke to patient's daughter Tina Curtis-regarding lab results suspect patient's shoulder pain from arthritis rather than malignancy.   Follow-up as planned

## 2020-09-10 ENCOUNTER — Telehealth: Payer: Self-pay | Admitting: Internal Medicine

## 2020-09-10 DIAGNOSIS — C50211 Malignant neoplasm of upper-inner quadrant of right female breast: Secondary | ICD-10-CM

## 2020-09-10 DIAGNOSIS — Z170 Estrogen receptor positive status [ER+]: Secondary | ICD-10-CM

## 2020-09-10 NOTE — Telephone Encounter (Signed)
Colette, Please have the patient return in 6 weeks with labs/md- cbc, metc, ca27.29 and cea, and ca15-3

## 2020-09-10 NOTE — Addendum Note (Signed)
Addended by: Gloris Ham on: 09/10/2020 11:00 AM   Modules accepted: Orders

## 2020-09-10 NOTE — Telephone Encounter (Signed)
Patient left vm requesting a call back with her next appointment with Dr. Rogue Bussing.  Checkout note from 4/18 states TBD.  Routing to team for follow up.

## 2020-09-11 ENCOUNTER — Ambulatory Visit: Payer: Medicare PPO | Admitting: Podiatry

## 2020-09-11 ENCOUNTER — Other Ambulatory Visit: Payer: Self-pay

## 2020-09-11 ENCOUNTER — Encounter: Payer: Self-pay | Admitting: Podiatry

## 2020-09-11 DIAGNOSIS — D2371 Other benign neoplasm of skin of right lower limb, including hip: Secondary | ICD-10-CM | POA: Diagnosis not present

## 2020-09-11 DIAGNOSIS — M79674 Pain in right toe(s): Secondary | ICD-10-CM

## 2020-09-11 DIAGNOSIS — M79675 Pain in left toe(s): Secondary | ICD-10-CM

## 2020-09-11 DIAGNOSIS — D2372 Other benign neoplasm of skin of left lower limb, including hip: Secondary | ICD-10-CM | POA: Diagnosis not present

## 2020-09-11 DIAGNOSIS — B351 Tinea unguium: Secondary | ICD-10-CM | POA: Diagnosis not present

## 2020-09-11 NOTE — Progress Notes (Signed)
She presents today chief complaint of painful toes toenails and calluses bilaterally.  Objective: Vital signs are stable alert oriented x3 pulses are palpable.  Severe hammertoe deformities right foot over left resulting in poor keratomas subthird fourth and fifth met of the right foot.  Toenails are long thick yellow dystrophic-like mycotic.  Assessment: Hammertoe deformities resulting in plantarflexed metatarsals and reactive hyper keratomas which are painful.  Pain limb secondary to onychomycosis.  Plan: Debrided all benign skin lesions today debrided nails 1 through 5 bilaterally discussed with her in great detail tenotomy's to the lesser toes of the right foot.  She understands and is amendable to it would like to discuss this with her children.

## 2020-10-22 ENCOUNTER — Inpatient Hospital Stay: Payer: Medicare PPO | Attending: Internal Medicine

## 2020-10-22 ENCOUNTER — Inpatient Hospital Stay (HOSPITAL_BASED_OUTPATIENT_CLINIC_OR_DEPARTMENT_OTHER): Payer: Medicare PPO | Admitting: Internal Medicine

## 2020-10-22 ENCOUNTER — Other Ambulatory Visit: Payer: Self-pay

## 2020-10-22 ENCOUNTER — Encounter: Payer: Self-pay | Admitting: Internal Medicine

## 2020-10-22 ENCOUNTER — Other Ambulatory Visit: Payer: Self-pay | Admitting: Internal Medicine

## 2020-10-22 DIAGNOSIS — C50211 Malignant neoplasm of upper-inner quadrant of right female breast: Secondary | ICD-10-CM

## 2020-10-22 DIAGNOSIS — N184 Chronic kidney disease, stage 4 (severe): Secondary | ICD-10-CM | POA: Insufficient documentation

## 2020-10-22 DIAGNOSIS — Z9013 Acquired absence of bilateral breasts and nipples: Secondary | ICD-10-CM | POA: Diagnosis not present

## 2020-10-22 DIAGNOSIS — E559 Vitamin D deficiency, unspecified: Secondary | ICD-10-CM | POA: Diagnosis not present

## 2020-10-22 DIAGNOSIS — Z85828 Personal history of other malignant neoplasm of skin: Secondary | ICD-10-CM | POA: Diagnosis not present

## 2020-10-22 DIAGNOSIS — I10 Essential (primary) hypertension: Secondary | ICD-10-CM | POA: Insufficient documentation

## 2020-10-22 DIAGNOSIS — Z79811 Long term (current) use of aromatase inhibitors: Secondary | ICD-10-CM | POA: Diagnosis not present

## 2020-10-22 DIAGNOSIS — Z170 Estrogen receptor positive status [ER+]: Secondary | ICD-10-CM | POA: Insufficient documentation

## 2020-10-22 DIAGNOSIS — C50912 Malignant neoplasm of unspecified site of left female breast: Secondary | ICD-10-CM | POA: Diagnosis not present

## 2020-10-22 DIAGNOSIS — Z79899 Other long term (current) drug therapy: Secondary | ICD-10-CM | POA: Insufficient documentation

## 2020-10-22 LAB — COMPREHENSIVE METABOLIC PANEL
ALT: 20 U/L (ref 0–44)
AST: 28 U/L (ref 15–41)
Albumin: 4.4 g/dL (ref 3.5–5.0)
Alkaline Phosphatase: 71 U/L (ref 38–126)
Anion gap: 10 (ref 5–15)
BUN: 53 mg/dL — ABNORMAL HIGH (ref 8–23)
CO2: 26 mmol/L (ref 22–32)
Calcium: 9.5 mg/dL (ref 8.9–10.3)
Chloride: 102 mmol/L (ref 98–111)
Creatinine, Ser: 1.7 mg/dL — ABNORMAL HIGH (ref 0.44–1.00)
GFR, Estimated: 28 mL/min — ABNORMAL LOW (ref >60)
Glucose, Bld: 158 mg/dL — ABNORMAL HIGH (ref 70–99)
Potassium: 4.7 mmol/L (ref 3.5–5.1)
Sodium: 138 mmol/L (ref 135–145)
Total Bilirubin: 0.4 mg/dL (ref 0.3–1.2)
Total Protein: 7.7 g/dL (ref 6.5–8.1)

## 2020-10-22 LAB — CBC WITH DIFFERENTIAL/PLATELET
Abs Immature Granulocytes: 0.04 10*3/uL (ref 0.00–0.07)
Basophils Absolute: 0.1 10*3/uL (ref 0.0–0.1)
Basophils Relative: 1 %
Eosinophils Absolute: 0.2 10*3/uL (ref 0.0–0.5)
Eosinophils Relative: 3 %
HCT: 37.3 % (ref 36.0–46.0)
Hemoglobin: 12.8 g/dL (ref 12.0–15.0)
Immature Granulocytes: 1 %
Lymphocytes Relative: 26 %
Lymphs Abs: 2 10*3/uL (ref 0.7–4.0)
MCH: 32.3 pg (ref 26.0–34.0)
MCHC: 34.3 g/dL (ref 30.0–36.0)
MCV: 94.2 fL (ref 80.0–100.0)
Monocytes Absolute: 0.8 10*3/uL (ref 0.1–1.0)
Monocytes Relative: 11 %
Neutro Abs: 4.4 10*3/uL (ref 1.7–7.7)
Neutrophils Relative %: 58 %
Platelets: 165 10*3/uL (ref 150–400)
RBC: 3.96 MIL/uL (ref 3.87–5.11)
RDW: 13.2 % (ref 11.5–15.5)
WBC: 7.5 10*3/uL (ref 4.0–10.5)
nRBC: 0 % (ref 0.0–0.2)

## 2020-10-22 NOTE — Assessment & Plan Note (Addendum)
There #Synchronous bilateral breast cancer-status post bilateral mastectomies- Right Breast-stage III ER/PR positive HER-2 NEG; & Left breast cancer stage II ER/PR Pos; Her 2 NEG. Currently on anastrozole.  TM-elevated but STABLE/Improving. April 2022- Bone scan-NEG for cancer. .  # Continue anastrozole.-Tolerating fairly well.  # Mild arthritic pain-osteoarthritis [bil knee L >R] G-1-2; recommend continue tylenol prn.    # vit D def [on ergocalciferol]-stable.  # CKD- Stage- IV- GFR 27 [Dr.Kolluru]; STABLE   # NOV 2021- OsteoporosisT-scoreof -3.7.on ca+vit D. Prolia q 6 months [Dec 2021]; will plan at next visit  # DISPOSITION:  # follow up in 6 weeks- MD; labs- cbc/cmp; YO06;00; CEA; ca15-3; Prolia-Dr.B  Cc;Dr.Baboff  Addendum: Left a message for patient's daughter Joya Salm an update regarding her mother/improving tumor markers.

## 2020-10-22 NOTE — Progress Notes (Signed)
x

## 2020-10-22 NOTE — Progress Notes (Signed)
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 Burnett] stage III T2N3 [19/24 LN]; ER/PR positive HER-2 negative.   # Left breast-invasive mammary carcinoma-status post mastectomy pT1cN1a; stage I-ER/PR positive HER-2 negative  #November 23, 2019-start adjuvant radiation bilateral [finished August 31]; no chemo;   #September 22nd 2021- anastrozole ----------------------------------------------------------------------------------------   A. RIGHT BREAST, 3:00; ULTRASOUND-GUIDED BIOPSY:  - INVASIVE MAMMARY CARCINOMA WITH MICROPAPILLARY FEATURES; Size of invasive carcinoma: 40m in this sample  Histologic grade of invasive carcinoma: Grade 2 ; ER/PR > 905: her 2- NEG  A. RIGHT BREAST, 3:00; ULTRASOUND-GUIDED BIOPSY: - INVASIVE MAMMARY CARCINOMA WITH MICROPAPILLARY FEATURES. 158min this sample. Grade 2. Lymphovascular invasion: Present.   B. LYMPH NODE, RIGHT AXILLARY; ULTRASOUND-GUIDED NEEDLE CORE BIOPSY: - METASTATIC MAMMARY CARCINOMA, 6 MM IN THIS SAMPLE.   C. RIGHT BREAST, LATERAL CALCIFICATIONS; STEREOTACTIC BIOPSY: - DUCTAL CARCINOMA IN SITU, INTERMEDIATE GRADE, WITH CALCIFICATIONS. -------------------------------------------------------------   # CKD- Stage III-IV [Dr.Kolluru]  # SURVIVORSHIP:   # GENETICS:   DIAGNOSIS:  Right breast cancer stage III;  left breast cancer-stage I GOALS: Cure  CURRENT/MOST RECENT THERAPY : Anastrozole    Carcinoma of  upper-inner quadrant of right breast in [place], estrogen receptor positive [place] 09/[place] Initial Diagnosis   Carcinoma of upper-inner quadrant of right breast in [place], estrogen receptor positive [place]     HISTORY OF PRESENTING ILLNESS:   Tina NOTARO89.o.  female synchronous bilateral breast cancer [right stage III ER/PR positive;Her2 NEG; left stage I ER/PR positive; Her2- NEG] s/p adjuvant radiation; currently on Anastrazole is here for follow-up/review results of the bone scan  Patient denies any worsening pain.  Has chronic joint pains back pain not any worse.   Chronic mild fatigue.  Not any worse.  Otherwise no chest pain or shortness of breath or cough.  Review of Systems  Constitutional:  Positive for malaise/fatigue. Negative for chills, diaphoresis, fever and weight loss.  HENT:  Negative for nosebleeds and sore throat.   Eyes:  Negative for double vision.  Respiratory:  Negative for cough, hemoptysis, sputum production, shortness of breath and wheezing.   Cardiovascular:  Negative for chest pain, palpitations, orthopnea and leg swelling.  Gastrointestinal:  Negative for abdominal pain, blood in stool, constipation, diarrhea, heartburn, melena, nausea and vomiting.  Genitourinary:  Negative for dysuria, frequency and urgency.  Musculoskeletal:  Positive for back pain and joint pain.  Neurological:  Negative for dizziness, tingling, focal weakness, weakness and headaches.  Endo/Heme/Allergies:  Does not bruise/bleed easily.  Psychiatric/Behavioral:  Negative for depression. The patient is not nervous/anxious and does not have insomnia.     MEDICAL HISTORY:  Past Medical History:  Diagnosis Date   Arthritis    Gout   Chronic kidney disease    Diarrhea    Hypercholesteremia    Hypertension    Neuropathy    feet and lower legs   Seasonal allergies    Skin cancer    face    SURGICAL HISTORY: Past Surgical History:  Procedure Laterality Date   ABDOMINAL  HYSTERECTOMY  2000   APPENDECTOMY  BREAST BIOPSY Right 09/19/2019   affirm bx of calcs UOQ, x marker, path pending   BREAST BIOPSY Right 09/19/2019   [place] bx of mass,heart marker, path pending   BREAST BIOPSY Right 09/19/2019   [place] bx of LN, coil marker, path pending   CATARACT EXTRACTION Left    CATARACT EXTRACTION W/PHACO Right 10/24/2014   Procedure: CATARACT EXTRACTION PHACO AND INTRAOCULAR LENS PLACEMENT ([place]);  Surgeon: Leandrew Koyanagi, MD;  Location: [facility];  Service: Ophthalmology;  Laterality: Right;   ESOPHAGOGASTRODUODENOSCOPY N/A 03/07/2018   Procedure: ESOPHAGOGASTRODUODENOSCOPY (EGD);  Surgeon: Lin Landsman, MD;  Location: [facility] ENDOSCOPY;  Service: Gastroenterology;  Laterality: N/A;   MASTECTOMY MODIFIED RADICAL Right 10/13/2019   Procedure: MASTECTOMY MODIFIED RADICAL;  Surgeon: Robert Bellow, MD;  Location: [facility] ORS;  Service: General;  Laterality: Right;   RENAL ANGIOGRAPHY Right 04/11/2018   Procedure: RENAL ANGIOGRAPHY;  Surgeon: Algernon Huxley, MD;  Location: [place] CV LAB;  Service: Cardiovascular;  Laterality: Right;   SIMPLE MASTECTOMY WITH AXILLARY SENTINEL NODE BIOPSY Left 10/13/2019   Procedure: SIMPLE MASTECTOMY TRUE CUT BIOPSY, SENTINEL NODE BIOPSY;  Surgeon: Robert Bellow, MD;  Location: [facility] ORS;  Service: General;  Laterality: Left;    SOCIAL HISTORY: Social History   Socioeconomic History   Marital status: Widowed    Spouse name: Not on file   Number of children: Not on file   Years of education: Not on file   Highest education level: Not on file  Occupational History   Not on file  Tobacco Use   Smoking status: Never   Smokeless tobacco: Never  Substance and Sexual Activity   Alcohol use: No   Drug use: Never   Sexual activity: Not on file  Other Topics Concern   Not on file  Social History Narrative   2 daughters; lives in [place]; Pharmacist, 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     Social Determinants of Health   Financial Resource Strain: Not on file  Food Insecurity: Not on file  Transportation Needs: Not on file  Physical Activity: Not on file  Stress: Not on file  Social Connections: Not on file  Intimate Partner Violence: Not on file    FAMILY HISTORY: Family History  Problem Relation Age of Onset   Hypertension Mother    Hypertension Father    Stroke Father    Breast cancer Daughter 59    ALLERGIES:  is allergic to penicillins and drug ingredient [zinc].  MEDICATIONS:  Current Outpatient Medications  Medication Sig Dispense Refill   allopurinol (ZYLOPRIM) 100 MG tablet      amLODipine (NORVASC) 5 MG tablet Take 2.5 mg by mouth daily.   1   anastrozole (ARIMIDEX) 1 MG tablet Take 1 tablet (1 mg total) by mouth daily. DO NOT START if RASH is NOT better 90 tablet 3   citalopram (CELEXA) 10 MG tablet Take 10 mg by mouth daily.      clopidogrel (PLAVIX) 75 MG tablet TAKE 1 TABLET BY MOUTH EVERY DAY 30 tablet 1   docusate sodium (COLACE) 100 MG capsule Take 100 mg by mouth daily as needed for mild constipation.     DULoxetine (CYMBALTA) 60 MG capsule Take 60 mg by mouth daily.      ergocalciferol (VITAMIN D2) 1.25 MG (50000 UT) capsule Take 1 capsule by mouth once a week.     furosemide (LASIX) 20 MG tablet Take 20 mg by mouth daily.      gabapentin (  NEURONTIN) 300 MG capsule Take 300 mg by mouth at bedtime.     loratadine (CLARITIN) 10 MG tablet Take 10 mg by mouth daily as needed for allergies. AM     metoprolol succinate (TOPROL-XL) 25 MG 24 hr tablet Take 37.5 mg by mouth at bedtime.      simvastatin (ZOCOR) 20 MG tablet Take 20 mg by mouth every evening. PM     No current facility-administered medications for this visit.      Marland Kitchen  PHYSICAL EXAMINATION: ECOG PERFORMANCE STATUS: 0 - Asymptomatic  Vitals:   10/22/20 1410  BP: 130/75  Pulse: 74  Resp: 16  Temp: 99.6 F (37.6 C)  SpO2: 96%   Filed Weights   10/22/20 1410  Weight: 152  lb (68.9 kg)    Physical Exam Constitutional:      Comments: Walks with a rolling walker.  Alone.  HENT:     Head: Normocephalic and atraumatic.     Mouth/Throat:     Pharynx: No oropharyngeal exudate.  Eyes:     Pupils: Pupils are equal, round, and reactive to light.  Cardiovascular:     Rate and Rhythm: Normal rate and regular rhythm.  Pulmonary:     Effort: Pulmonary effort is normal. No respiratory distress.     Breath sounds: Normal breath sounds. No wheezing.  Abdominal:     General: Bowel sounds are normal. There is no distension.     Palpations: Abdomen is soft. There is no mass.     Tenderness: no abdominal tenderness There is no guarding or rebound.  Musculoskeletal:        General: No tenderness. Normal range of motion.     Cervical back: Normal range of motion and neck supple.  Skin:    General: Skin is warm.  Neurological:     Mental Status: She is alert and oriented to person, place, and time.  Psychiatric:        Mood and Affect: Affect normal.   LABORATORY DATA:  I have reviewed the data as listed Lab Results  Component Value Date   WBC 7.5 10/22/2020   HGB 12.8 10/22/2020   HCT 37.3 10/22/2020   MCV 94.2 10/22/2020   PLT 165 10/22/2020   Recent Labs    11/17/19 1014 12/20/19 0910 01/18/20 0935 02/26/20 0954 06/28/20 1358 08/26/20 1425 10/22/20 1401  NA 142 140 139   < > 141 141 138  K 4.6 4.9 4.7   < > 4.9 4.1 4.7  CL 105 105 103   < > 107 105 102  CO2 _0 < > _1 GLUCOSE 139* 123* 127*   < > 150* 200* 158*  BUN 48* 38* 47*   < > 36* 37* 53*  CREATININE 1.88* 1.72* 1.83*   < > 1.35* 1.42* 1.70*  CALCIUM 9.5 9.4 9.5   < > 8.9 9.6 9.5  GFRNONAA 23* 26* 24*   < > 37* 35* 28*  GFRAA 27* 30* 28*  --   --   --   --   PROT 7.4 7.4 8.1   < > 7.1 7.5 7.7  ALBUMIN 4.1 4.3 4.4   < > 4.0 4.3 4.4  AST _2 < > _3 ALT _4 < > _5 ALKPHOS 105 110 109   < > 64 65 71  BILITOT 0.7 0.6 0.7   < > 0.6 0.7  0.4   < > =  values in this interval not displayed.          RADIOGRAPHIC STUDIES: I have personally reviewed the radiological images as listed and agreed with the findings in the report. No results found.  ASSESSMENT & PLAN:   Carcinoma of upper-inner quadrant of right breast in female, estrogen receptor positive ([place]) There #Synchronous bilateral breast cancer-status post bilateral mastectomies- Right Breast-stage III ER/PR positive HER-2 NEG; & Left breast cancer stage II ER/PR Pos; Her 2 NEG. Currently on anastrozole.  TM-elevated but STABLE/Improving. April 2022- Bone scan-NEG for cancer. .  # Continue anastrozole.-Tolerating fairly well.  # Mild arthritic pain-osteoarthritis [bil knee L >R] G-1-2; recommend continue tylenol prn.    # vit D def [on ergocalciferol]-stable.  # CKD- Stage- IV- GFR 27 [Dr.Kolluru]; STABLE   # NOV 2021- OsteoporosisT-scoreof -3.7.on ca+vit D. Prolia q 6 months [Dec 2021]; will plan at next visit  # DISPOSITION:  # follow up in 6 weeks- MD; labs- cbc/cmp; HS92;90; CEA; ca15-3; Prolia-Dr.B  Cc;Dr.Baboff  Addendum: Left a message for patient's daughter Joya Salm an update regarding her mother/improving tumor markers.   All questions were answered. The patient/family knows to call the clinic with any problems, questions or concerns.     Cammie Sickle, MD 11/03/2020 8:35 PM

## 2020-10-22 NOTE — Progress Notes (Signed)
Concerned about prolia causing pain in right shoulder. Has SOB especially early in the mornings. Has dizziness and headaches in the morning a lot. States headache feels like a sinus headache because allergy med helps.

## 2020-10-23 LAB — CEA: CEA: 4.1 ng/mL (ref 0.0–4.7)

## 2020-10-23 LAB — CANCER ANTIGEN 27.29: CA 27.29: 38 U/mL (ref 0.0–38.6)

## 2020-10-23 LAB — CANCER ANTIGEN 15-3: CA 15-3: 32 U/mL — ABNORMAL HIGH (ref 0.0–25.0)

## 2020-10-30 ENCOUNTER — Telehealth: Payer: Self-pay | Admitting: Internal Medicine

## 2020-10-30 NOTE — Telephone Encounter (Signed)
On 6/22-left a voicemail for the daughter [name] that her mother's tumor markers are stable/improving.  Continue current treatment no changes made follow-up as planned.  GB

## 2020-11-03 ENCOUNTER — Encounter: Payer: Self-pay | Admitting: Internal Medicine

## 2020-12-04 ENCOUNTER — Inpatient Hospital Stay: Payer: Medicare PPO

## 2020-12-04 ENCOUNTER — Encounter: Payer: Self-pay | Admitting: Internal Medicine

## 2020-12-04 ENCOUNTER — Inpatient Hospital Stay: Payer: Medicare PPO | Attending: Internal Medicine

## 2020-12-04 ENCOUNTER — Inpatient Hospital Stay (HOSPITAL_BASED_OUTPATIENT_CLINIC_OR_DEPARTMENT_OTHER): Payer: Medicare PPO | Admitting: Internal Medicine

## 2020-12-04 ENCOUNTER — Other Ambulatory Visit: Payer: Self-pay

## 2020-12-04 DIAGNOSIS — C50912 Malignant neoplasm of unspecified site of left female breast: Secondary | ICD-10-CM | POA: Diagnosis not present

## 2020-12-04 DIAGNOSIS — C50211 Malignant neoplasm of upper-inner quadrant of right female breast: Secondary | ICD-10-CM

## 2020-12-04 DIAGNOSIS — Z170 Estrogen receptor positive status [ER+]: Secondary | ICD-10-CM

## 2020-12-04 DIAGNOSIS — M810 Age-related osteoporosis without current pathological fracture: Secondary | ICD-10-CM | POA: Insufficient documentation

## 2020-12-04 LAB — COMPREHENSIVE METABOLIC PANEL
ALT: 15 U/L (ref 0–44)
AST: 23 U/L (ref 15–41)
Albumin: 4.3 g/dL (ref 3.5–5.0)
Alkaline Phosphatase: 85 U/L (ref 38–126)
Anion gap: 8 (ref 5–15)
BUN: 41 mg/dL — ABNORMAL HIGH (ref 8–23)
CO2: 24 mmol/L (ref 22–32)
Calcium: 9.2 mg/dL (ref 8.9–10.3)
Chloride: 103 mmol/L (ref 98–111)
Creatinine, Ser: 1.71 mg/dL — ABNORMAL HIGH (ref 0.44–1.00)
GFR, Estimated: 28 mL/min — ABNORMAL LOW (ref >60)
Glucose, Bld: 172 mg/dL — ABNORMAL HIGH (ref 70–99)
Potassium: 4.3 mmol/L (ref 3.5–5.1)
Sodium: 135 mmol/L (ref 135–145)
Total Bilirubin: 0.5 mg/dL (ref 0.3–1.2)
Total Protein: 7.4 g/dL (ref 6.5–8.1)

## 2020-12-04 LAB — CBC WITH DIFFERENTIAL/PLATELET
Abs Immature Granulocytes: 0.03 10*3/uL (ref 0.00–0.07)
Basophils Absolute: 0.1 10*3/uL (ref 0.0–0.1)
Basophils Relative: 1 %
Eosinophils Absolute: 0.3 10*3/uL (ref 0.0–0.5)
Eosinophils Relative: 4 %
HCT: 35.8 % — ABNORMAL LOW (ref 36.0–46.0)
Hemoglobin: 12.2 g/dL (ref 12.0–15.0)
Immature Granulocytes: 0 %
Lymphocytes Relative: 24 %
Lymphs Abs: 1.8 10*3/uL (ref 0.7–4.0)
MCH: 32.8 pg (ref 26.0–34.0)
MCHC: 34.1 g/dL (ref 30.0–36.0)
MCV: 96.2 fL (ref 80.0–100.0)
Monocytes Absolute: 0.8 10*3/uL (ref 0.1–1.0)
Monocytes Relative: 11 %
Neutro Abs: 4.5 10*3/uL (ref 1.7–7.7)
Neutrophils Relative %: 60 %
Platelets: 174 10*3/uL (ref 150–400)
RBC: 3.72 MIL/uL — ABNORMAL LOW (ref 3.87–5.11)
RDW: 13 % (ref 11.5–15.5)
WBC: 7.5 10*3/uL (ref 4.0–10.5)
nRBC: 0 % (ref 0.0–0.2)

## 2020-12-04 MED ORDER — DENOSUMAB 60 MG/ML ~~LOC~~ SOSY
60.0000 mg | PREFILLED_SYRINGE | Freq: Once | SUBCUTANEOUS | Status: AC
  Administered 2020-12-04: 60 mg via SUBCUTANEOUS
  Filled 2020-12-04: qty 1

## 2020-12-04 NOTE — Progress Notes (Signed)
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 Burnett] stage III T2N3 [19/24 LN]; ER/PR positive HER-2 negative.   # Left breast-invasive mammary carcinoma-status post mastectomy pT1cN1a; stage I-ER/PR positive HER-2 negative  #November 23, 2019-start adjuvant radiation bilateral [finished August 31]; no chemo;   #September 22nd 2021- anastrozole ----------------------------------------------------------------------------------------   A. RIGHT BREAST, 3:00; ULTRASOUND-GUIDED BIOPSY:  - INVASIVE MAMMARY CARCINOMA WITH MICROPAPILLARY FEATURES; Size of invasive carcinoma: 27m in this sample  Histologic grade of invasive carcinoma: Grade 2 ; ER/PR > 905: her 2- NEG  A. RIGHT BREAST, 3:00; ULTRASOUND-GUIDED BIOPSY: - INVASIVE MAMMARY CARCINOMA WITH MICROPAPILLARY FEATURES. 172min this sample. Grade 2. Lymphovascular invasion: Present.   B. LYMPH NODE, RIGHT AXILLARY; ULTRASOUND-GUIDED NEEDLE CORE BIOPSY: - METASTATIC MAMMARY CARCINOMA, 6 MM IN THIS SAMPLE.   C. RIGHT BREAST, LATERAL CALCIFICATIONS; STEREOTACTIC BIOPSY: - DUCTAL CARCINOMA IN SITU, INTERMEDIATE GRADE, WITH CALCIFICATIONS. -------------------------------------------------------------   # CKD- Stage III-IV [Dr.Kolluru]  # SURVIVORSHIP:   # GENETICS:   DIAGNOSIS:  Right breast cancer stage III;  left breast cancer-stage I GOALS: Cure  CURRENT/MOST RECENT THERAPY : Anastrozole    Carcinoma of  upper-inner quadrant of right breast in female, estrogen receptor positive ([facility] 09/27/2019 Initial Diagnosis   Carcinoma of upper-inner quadrant of right breast in female, estrogen receptor positive ([facility]     HISTORY OF PRESENTING ILLNESS:   Tina Curtis.o.  female synchronous bilateral breast cancer [right stage III ER/PR positive;Her2 NEG; left stage I ER/PR positive; Her2- NEG] s/p adjuvant radiation; currently on Anastrazole is here for follow-up.  Unfortunately patient had mechanical fall while at home.  Patient had eyes dilated/and thinks she might have lost her balance and fell.  She hit her chest and left arm.  Did not hit her head.  No loss of consciousness.  Continues to complain of mild arthritic pain.  Not any worse.  Otherwise no chest pain or shortness of breath or cough.  No headaches.  Review of Systems  Constitutional:  Positive for malaise/fatigue. Negative for chills, diaphoresis, fever and weight loss.  HENT:  Negative for nosebleeds and sore throat.   Eyes:  Negative for double vision.  Respiratory:  Negative for cough, hemoptysis, sputum production, shortness of breath and wheezing.   Cardiovascular:  Negative for chest pain, palpitations, orthopnea and leg swelling.  Gastrointestinal:  Negative for abdominal pain, blood in stool, constipation, diarrhea, heartburn, melena, nausea and vomiting.  Genitourinary:  Negative for dysuria, frequency and urgency.  Musculoskeletal:  Positive for back pain and joint pain.  Neurological:  Negative for dizziness, tingling, focal weakness, weakness and headaches.  Endo/Heme/Allergies:  Does not bruise/bleed easily.  Psychiatric/Behavioral:  Negative for depression. The patient is not nervous/anxious and does not have insomnia.     MEDICAL HISTORY:  Past Medical History:  Diagnosis Date   Arthritis    Gout   Chronic kidney disease    Diarrhea    Hypercholesteremia    Hypertension    Neuropathy    feet and lower  legs   Seasonal allergies  Skin cancer    face    SURGICAL HISTORY: Past Surgical History:  Procedure Laterality Date   ABDOMINAL HYSTERECTOMY  2000   APPENDECTOMY     BREAST BIOPSY Right 09/19/2019   affirm bx of calcs UOQ, x marker, path pending   BREAST BIOPSY Right 09/19/2019   [place] bx of mass,heart marker, path pending   BREAST BIOPSY Right 09/19/2019   [place] bx of LN, coil marker, path pending   CATARACT EXTRACTION Left    CATARACT EXTRACTION W/PHACO Right 10/24/2014   Procedure: CATARACT EXTRACTION PHACO AND INTRAOCULAR LENS PLACEMENT ([place]);  Surgeon: Leandrew Koyanagi, MD;  Location: [facility];  Service: Ophthalmology;  Laterality: Right;   ESOPHAGOGASTRODUODENOSCOPY N/A 03/07/2018   Procedure: ESOPHAGOGASTRODUODENOSCOPY (EGD);  Surgeon: Lin Landsman, MD;  Location: [facility] ENDOSCOPY;  Service: Gastroenterology;  Laterality: N/A;   MASTECTOMY MODIFIED RADICAL Right 10/13/2019   Procedure: MASTECTOMY MODIFIED RADICAL;  Surgeon: Robert Bellow, MD;  Location: [facility] ORS;  Service: General;  Laterality: Right;   RENAL ANGIOGRAPHY Right 04/11/2018   Procedure: RENAL ANGIOGRAPHY;  Surgeon: Algernon Huxley, MD;  Location: [place] CV LAB;  Service: Cardiovascular;  Laterality: Right;   SIMPLE MASTECTOMY WITH AXILLARY SENTINEL NODE BIOPSY Left 10/13/2019   Procedure: SIMPLE MASTECTOMY TRUE CUT BIOPSY, SENTINEL NODE BIOPSY;  Surgeon: Robert Bellow, MD;  Location: [facility] ORS;  Service: General;  Laterality: Left;    SOCIAL HISTORY: Social History   Socioeconomic History   Marital status: Widowed    Spouse name: Not on file   Number of children: Not on file   Years of education: Not on file   Highest education level: Not on file  Occupational History   Not on file  Tobacco Use   Smoking status: Never   Smokeless tobacco: Never  Substance and Sexual Activity   Alcohol use: No   Drug use: Never   Sexual activity: Not on file  Other Topics Concern   Not on  file  Social History Narrative   2 daughters; lives in [place]; Pharmacist, 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    Social Determinants of Health   Financial Resource Strain: Not on file  Food Insecurity: Not on file  Transportation Needs: Not on file  Physical Activity: Not on file  Stress: Not on file  Social Connections: Not on file  Intimate Partner Violence: Not on file    FAMILY HISTORY: Family History  Problem Relation Age of Onset   Hypertension Mother    Hypertension Father    Stroke Father    Breast cancer Daughter 13    ALLERGIES:  is allergic to penicillins and drug ingredient [zinc].  MEDICATIONS:  Current Outpatient Medications  Medication Sig Dispense Refill   allopurinol (ZYLOPRIM) 100 MG tablet      amLODipine (NORVASC) 5 MG tablet Take 2.5 mg by mouth daily.   1   anastrozole (ARIMIDEX) 1 MG tablet Take 1 tablet (1 mg total) by mouth daily. DO NOT START if RASH is NOT better 90 tablet 3   citalopram (CELEXA) 10 MG tablet Take 10 mg by mouth daily.      clopidogrel (PLAVIX) 75 MG tablet TAKE 1 TABLET BY MOUTH EVERY DAY 30 tablet 1   docusate sodium (COLACE) 100 MG capsule Take 100 mg by mouth daily as needed for mild constipation.     DULoxetine (CYMBALTA) 60 MG capsule Take 60 mg by mouth daily.      ergocalciferol (VITAMIN D2) 1.25 MG (50000  UT) capsule Take 1 capsule by mouth once a week.     furosemide (LASIX) 20 MG tablet Take 20 mg by mouth daily.      gabapentin (NEURONTIN) 300 MG capsule Take 300 mg by mouth at bedtime.     loratadine (CLARITIN) 10 MG tablet Take 10 mg by mouth daily as needed for allergies. AM     metoprolol succinate (TOPROL-XL) 25 MG 24 hr tablet Take 37.5 mg by mouth at bedtime.      simvastatin (ZOCOR) 20 MG tablet Take 20 mg by mouth every evening. PM     No current facility-administered medications for this visit.      Marland Kitchen  PHYSICAL EXAMINATION: ECOG PERFORMANCE STATUS: 0 - Asymptomatic  Vitals:    12/04/20 1508  BP: 119/68  Pulse: 73  Resp: 16  Temp: 98.9 F (37.2 C)  SpO2: 98%   Filed Weights   12/04/20 1508  Weight: 152 lb 6.4 oz (69.1 kg)    Physical Exam Constitutional:      Comments: Walks with a rolling walker.  Alone.  HENT:     Head: Normocephalic and atraumatic.     Mouth/Throat:     Pharynx: No oropharyngeal exudate.  Eyes:     Pupils: Pupils are equal, round, and reactive to light.  Cardiovascular:     Rate and Rhythm: Normal rate and regular rhythm.  Pulmonary:     Effort: Pulmonary effort is normal. No respiratory distress.     Breath sounds: Normal breath sounds. No wheezing.  Abdominal:     General: Bowel sounds are normal. There is no distension.     Palpations: Abdomen is soft. There is no mass.     Tenderness: There is no abdominal tenderness. There is no guarding or rebound.  Musculoskeletal:        General: No tenderness. Normal range of motion.     Cervical back: Normal range of motion and neck supple.  Skin:    General: Skin is warm.     Comments: Bruising bruising noted on the chest and left arm.  Neurological:     Mental Status: She is alert and oriented to person, place, and time.  Psychiatric:        Mood and Affect: Affect normal.   LABORATORY DATA:  I have reviewed the data as listed Lab Results  Component Value Date   WBC 7.5 12/04/2020   HGB 12.2 12/04/2020   HCT 35.8 (L) 12/04/2020   MCV 96.2 12/04/2020   PLT 174 12/04/2020   Recent Labs    12/20/19 0910 01/18/20 0935 02/26/20 0954 08/26/20 1425 10/22/20 1401 12/04/20 1435  NA 140 139   < > 141 138 135  K 4.9 4.7   < > 4.1 4.7 4.3  CL 105 103   < > 105 102 103  CO2 25 26   < > '26 26 24  ' GLUCOSE 123* 127*   < > 200* 158* 172*  BUN 38* 47*   < > 37* 53* 41*  CREATININE 1.72* 1.83*   < > 1.42* 1.70* 1.71*  CALCIUM 9.4 9.5   < > 9.6 9.5 9.2  GFRNONAA 26* 24*   < > 35* 28* 28*  GFRAA 30* 28*  --   --   --   --   PROT 7.4 8.1   < > 7.5 7.7 7.4  ALBUMIN 4.3 4.4    < > 4.3 4.4 4.3  AST 25 27   < > 27 28  23  ALT 15 19   < > '18 20 15  ' ALKPHOS 110 109   < > 65 71 85  BILITOT 0.6 0.7   < > 0.7 0.4 0.5   < > = values in this interval not displayed.          RADIOGRAPHIC STUDIES: I have personally reviewed the radiological images as listed and agreed with the findings in the report. No results found.  ASSESSMENT & PLAN:   Carcinoma of upper-inner quadrant of right breast in female, estrogen receptor positive ([place]) There #Synchronous bilateral breast cancer-status post bilateral mastectomies- Right Breast-stage III ER/PR positive HER-2 NEG; & Left breast cancer stage II ER/PR Pos; Her 2 NEG. Currently on anastrozole.  STABLE/Improving. April 2022- Bone scan-NEG for cancer. .  # Continue anastrozole.-Tolerating fairly well-no obvious evidence of recurrence noted.  # S/p Fall- [1 week ago; pupils dilated]; no head trauma [chest/left arm]; no syncope-likely mechanical.  Again monitoring for falls closely.  Especially given severe osteoporosis and risk of fractures.  # Mild arthritic pain-osteoarthritis [bil knee L >R] G-1-2; STABLE;  continue tylenol prn.    # vit D def [on ergocalciferol]--stable  # CKD- Stage- IV- GFR 27 [Dr.Kolluru]; stable  # NOV 2021- OsteoporosisT-scoreof -3.7.on ca+vit D. Prolia q 6 months [July, 6761].   # DISPOSITION:  # Prolia today # follow up in 2 monthss- MD; labs- cbc/cmp; PJ09;32; CEA; ca15-3; Dr.B .   All questions were answered. The patient/family knows to call the clinic with any problems, questions or concerns.     Cammie Sickle, MD 12/04/2020 8:25 PM

## 2020-12-04 NOTE — Progress Notes (Signed)
Feeling very tired and weak. Had a fall last Tuesday and has several bruises over her body. Did not get seen in ED but nurse from place of residence checked on her. Eyes were dilated after an appointment she had on Tuesday. Had a feeling nausea down stairs in the lab.

## 2020-12-04 NOTE — Assessment & Plan Note (Addendum)
There #Synchronous bilateral breast cancer-status post bilateral mastectomies- Right Breast-stage III ER/PR positive HER-2 NEG; & Left breast cancer stage II ER/PR Pos; Her 2 NEG. Currently on anastrozole.  STABLE/Improving. April 2022- Bone scan-NEG for cancer. .  # Continue anastrozole.-Tolerating fairly well-no obvious evidence of recurrence noted.  # S/p Fall- [1 week ago; pupils dilated]; no head trauma [chest/left arm]; no syncope-likely mechanical.  Again monitoring for falls closely.  Especially given severe osteoporosis and risk of fractures.  # Mild arthritic pain-osteoarthritis [bil knee L >R] G-1-2; STABLE;  continue tylenol prn.    # vit D def [on ergocalciferol]--stable  # CKD- Stage- IV- GFR 27 [Dr.Kolluru]; stable  # NOV 2021- OsteoporosisT-scoreof -3.7.on ca+vit D. Prolia q 6 months [July, 0165].   # DISPOSITION:  # Prolia today # follow up in 2 monthss- MD; labs- cbc/cmp; VV74;82; CEA; ca15-3; Dr.B .

## 2020-12-05 LAB — CANCER ANTIGEN 15-3: CA 15-3: 27.9 U/mL — ABNORMAL HIGH (ref 0.0–25.0)

## 2020-12-05 LAB — CEA: CEA: 3.6 ng/mL (ref 0.0–4.7)

## 2020-12-05 LAB — CANCER ANTIGEN 27.29: CA 27.29: 27.8 U/mL (ref 0.0–38.6)

## 2020-12-18 ENCOUNTER — Ambulatory Visit: Payer: Medicare PPO | Admitting: Podiatry

## 2020-12-23 ENCOUNTER — Ambulatory Visit: Payer: Medicare PPO | Admitting: Podiatry

## 2020-12-23 ENCOUNTER — Other Ambulatory Visit: Payer: Self-pay

## 2020-12-23 ENCOUNTER — Encounter: Payer: Self-pay | Admitting: Podiatry

## 2020-12-23 DIAGNOSIS — B351 Tinea unguium: Secondary | ICD-10-CM

## 2020-12-23 DIAGNOSIS — M79674 Pain in right toe(s): Secondary | ICD-10-CM | POA: Diagnosis not present

## 2020-12-23 DIAGNOSIS — D2372 Other benign neoplasm of skin of left lower limb, including hip: Secondary | ICD-10-CM

## 2020-12-23 DIAGNOSIS — D2371 Other benign neoplasm of skin of right lower limb, including hip: Secondary | ICD-10-CM

## 2020-12-23 DIAGNOSIS — M79675 Pain in left toe(s): Secondary | ICD-10-CM | POA: Diagnosis not present

## 2020-12-23 DIAGNOSIS — D689 Coagulation defect, unspecified: Secondary | ICD-10-CM | POA: Diagnosis not present

## 2020-12-23 NOTE — Progress Notes (Signed)
She presents today chief complaint of painfully elongated toenails and calluses.  Objective: Toenails are long thick yellow dystrophic Lee mycotic multiple benign skin lesions bilateral no open lesions or wounds.  Assessment: Benign skin lesions bilateral pain in limb secondary to onychomycosis.  Plan: Debrided benign skin lesions and debrided toenails 1 through 5 bilateral secondary to pain.

## 2021-01-09 ENCOUNTER — Other Ambulatory Visit: Payer: Self-pay | Admitting: *Deleted

## 2021-01-09 DIAGNOSIS — C50211 Malignant neoplasm of upper-inner quadrant of right female breast: Secondary | ICD-10-CM

## 2021-01-15 ENCOUNTER — Inpatient Hospital Stay: Payer: Medicare PPO | Attending: Internal Medicine

## 2021-01-15 ENCOUNTER — Inpatient Hospital Stay: Payer: Medicare PPO | Admitting: Internal Medicine

## 2021-01-15 ENCOUNTER — Encounter: Payer: Self-pay | Admitting: Internal Medicine

## 2021-01-15 ENCOUNTER — Other Ambulatory Visit: Payer: Self-pay

## 2021-01-15 DIAGNOSIS — N184 Chronic kidney disease, stage 4 (severe): Secondary | ICD-10-CM | POA: Diagnosis not present

## 2021-01-15 DIAGNOSIS — C50211 Malignant neoplasm of upper-inner quadrant of right female breast: Secondary | ICD-10-CM

## 2021-01-15 DIAGNOSIS — R269 Unspecified abnormalities of gait and mobility: Secondary | ICD-10-CM | POA: Diagnosis not present

## 2021-01-15 DIAGNOSIS — Z923 Personal history of irradiation: Secondary | ICD-10-CM | POA: Diagnosis not present

## 2021-01-15 DIAGNOSIS — Z79811 Long term (current) use of aromatase inhibitors: Secondary | ICD-10-CM | POA: Diagnosis not present

## 2021-01-15 DIAGNOSIS — M818 Other osteoporosis without current pathological fracture: Secondary | ICD-10-CM | POA: Insufficient documentation

## 2021-01-15 DIAGNOSIS — Z170 Estrogen receptor positive status [ER+]: Secondary | ICD-10-CM | POA: Insufficient documentation

## 2021-01-15 LAB — CBC WITH DIFFERENTIAL/PLATELET
Abs Immature Granulocytes: 0.03 10*3/uL (ref 0.00–0.07)
Basophils Absolute: 0.1 10*3/uL (ref 0.0–0.1)
Basophils Relative: 1 %
Eosinophils Absolute: 0.2 10*3/uL (ref 0.0–0.5)
Eosinophils Relative: 3 %
HCT: 37.7 % (ref 36.0–46.0)
Hemoglobin: 12.7 g/dL (ref 12.0–15.0)
Immature Granulocytes: 1 %
Lymphocytes Relative: 28 %
Lymphs Abs: 1.9 10*3/uL (ref 0.7–4.0)
MCH: 32.6 pg (ref 26.0–34.0)
MCHC: 33.7 g/dL (ref 30.0–36.0)
MCV: 96.7 fL (ref 80.0–100.0)
Monocytes Absolute: 0.8 10*3/uL (ref 0.1–1.0)
Monocytes Relative: 12 %
Neutro Abs: 3.6 10*3/uL (ref 1.7–7.7)
Neutrophils Relative %: 55 %
Platelets: 185 10*3/uL (ref 150–400)
RBC: 3.9 MIL/uL (ref 3.87–5.11)
RDW: 13.6 % (ref 11.5–15.5)
WBC: 6.5 10*3/uL (ref 4.0–10.5)
nRBC: 0 % (ref 0.0–0.2)

## 2021-01-15 LAB — COMPREHENSIVE METABOLIC PANEL
ALT: 15 U/L (ref 0–44)
AST: 25 U/L (ref 15–41)
Albumin: 4.5 g/dL (ref 3.5–5.0)
Alkaline Phosphatase: 89 U/L (ref 38–126)
Anion gap: 8 (ref 5–15)
BUN: 40 mg/dL — ABNORMAL HIGH (ref 8–23)
CO2: 25 mmol/L (ref 22–32)
Calcium: 9.5 mg/dL (ref 8.9–10.3)
Chloride: 104 mmol/L (ref 98–111)
Creatinine, Ser: 1.7 mg/dL — ABNORMAL HIGH (ref 0.44–1.00)
GFR, Estimated: 28 mL/min — ABNORMAL LOW (ref >60)
Glucose, Bld: 147 mg/dL — ABNORMAL HIGH (ref 70–99)
Potassium: 4.2 mmol/L (ref 3.5–5.1)
Sodium: 137 mmol/L (ref 135–145)
Total Bilirubin: 0.5 mg/dL (ref 0.3–1.2)
Total Protein: 8 g/dL (ref 6.5–8.1)

## 2021-01-15 NOTE — Progress Notes (Signed)
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 Burnett] stage III T2N3 [19/24 LN]; ER/PR positive HER-2 negative.   # Left breast-invasive mammary carcinoma-status post mastectomy [id number]; stage I-ER/PR positive HER-2 negative  #November 23, 2019-start adjuvant radiation bilateral [finished August 31]; no chemo;   #September 22nd 2021- anastrozole ----------------------------------------------------------------------------------------   A. RIGHT BREAST, 3:00; ULTRASOUND-GUIDED BIOPSY:  - INVASIVE MAMMARY CARCINOMA WITH MICROPAPILLARY FEATURES; Size of invasive carcinoma: 27m in this sample  Histologic grade of invasive carcinoma: Grade 2 ; ER/PR > 905: her 2- NEG  A. RIGHT BREAST, 3:00; ULTRASOUND-GUIDED BIOPSY: - INVASIVE MAMMARY CARCINOMA WITH MICROPAPILLARY FEATURES. 181min this sample. Grade 2. Lymphovascular invasion: Present.   B. LYMPH NODE, RIGHT AXILLARY; ULTRASOUND-GUIDED NEEDLE CORE BIOPSY: - METASTATIC MAMMARY CARCINOMA, 6 MM IN THIS SAMPLE.   C. RIGHT BREAST, LATERAL CALCIFICATIONS; STEREOTACTIC BIOPSY: - DUCTAL CARCINOMA IN SITU, INTERMEDIATE GRADE, WITH CALCIFICATIONS. -------------------------------------------------------------   # CKD- Stage III-IV [Dr.Kolluru]  # SURVIVORSHIP:   # GENETICS:   DIAGNOSIS:  Right breast cancer stage III;  left breast cancer-stage I GOALS: Cure  CURRENT/MOST RECENT THERAPY : Anastrozole    Carcinoma of  upper-inner quadrant of right breast in female, estrogen receptor positive ([place] 09/27/2019 Initial Diagnosis   Carcinoma of upper-inner quadrant of right breast in [place], estrogen receptor positive [place]     HISTORY OF PRESENTING ILLNESS: Ambulating walking with a rolling walker ; alone.   FaMARYKAY MCCLEOD89.o.  female synchronous bilateral breast cancer [right stage III ER/PR positive;Her2 NEG; left stage I ER/PR positive; Her2- NEG] s/p adjuvant radiation; currently on Anastrazole is here for follow-up.  Patient is still concerned about the recent fall that she had approximately 2 months ago.  Denies any further falls.  However she is concerned about gait instability.  Continues to complain of mild arthritic pain.  Not any worse.  Otherwise no chest pain or shortness of breath or cough.  No headaches.  Review of Systems  Constitutional:  Positive for malaise/fatigue. Negative for chills, diaphoresis, fever and weight loss.  HENT:  Negative for nosebleeds and sore throat.   Eyes:  Negative for double vision.  Respiratory:  Negative for cough, hemoptysis, sputum production, shortness of breath and wheezing.   Cardiovascular:  Negative for chest pain, palpitations, orthopnea and leg swelling.  Gastrointestinal:  Negative for abdominal pain, blood in stool, constipation, diarrhea, heartburn, melena, nausea and vomiting.  Genitourinary:  Negative for dysuria, frequency and urgency.  Musculoskeletal:  Positive for back pain and joint pain.  Neurological:  Negative for dizziness, tingling, focal weakness, weakness and headaches.  Endo/Heme/Allergies:  Does not bruise/bleed easily.  Psychiatric/Behavioral:  Negative for depression. The patient is not nervous/anxious and does not have insomnia.     MEDICAL HISTORY:  Past Medical History:  Diagnosis Date   Arthritis    Gout   Chronic kidney disease    Diarrhea    Hypercholesteremia    Hypertension    Neuropathy    feet and lower legs    Seasonal allergies    Skin cancer  face    SURGICAL HISTORY: Past Surgical History:  Procedure Laterality Date   ABDOMINAL HYSTERECTOMY  2000   APPENDECTOMY     BREAST BIOPSY Right 09/19/2019   affirm bx of calcs UOQ, x marker, path pending   BREAST BIOPSY Right 09/19/2019   [place] bx of mass,heart marker, path pending   BREAST BIOPSY Right 09/19/2019   [place] bx of LN, coil marker, path pending   CATARACT EXTRACTION Left    CATARACT EXTRACTION W/PHACO Right 10/24/2014   Procedure: CATARACT EXTRACTION PHACO AND INTRAOCULAR LENS PLACEMENT ([place]);  Surgeon: Leandrew Koyanagi, MD;  Location: [facility];  Service: Ophthalmology;  Laterality: Right;   ESOPHAGOGASTRODUODENOSCOPY N/A 03/07/2018   Procedure: ESOPHAGOGASTRODUODENOSCOPY (EGD);  Surgeon: Lin Landsman, MD;  [facility];  Service: Gastroenterology;  Laterality: N/A;   MASTECTOMY MODIFIED RADICAL Right 10/13/2019   Procedure: MASTECTOMY MODIFIED RADICAL;  Surgeon: Robert Bellow, MD;  Location: [facility] ORS;  Service: General;  Laterality: Right;   RENAL ANGIOGRAPHY Right 04/11/2018   Procedure: RENAL ANGIOGRAPHY;  Surgeon: Algernon Huxley, MD;  Location: [facility] CV LAB;  Service: Cardiovascular;  Laterality: Right;   SIMPLE MASTECTOMY WITH AXILLARY SENTINEL NODE BIOPSY Left 10/13/2019   Procedure: SIMPLE MASTECTOMY TRUE CUT BIOPSY, SENTINEL NODE BIOPSY;  Surgeon: Robert Bellow, MD;  Location: [facility];  Service: General;  Laterality: Left;    SOCIAL HISTORY: Social History   Socioeconomic History   Marital status: Widowed    Spouse name: Not on file   Number of children: Not on file   Years of education: Not on file   Highest education level: Not on file  Occupational History   Not on file  Tobacco Use   Smoking status: Never   Smokeless tobacco: Never  Substance and Sexual Activity   Alcohol use: No   Drug use: Never   Sexual activity: Not on file  Other Topics Concern   Not on file   Social History Narrative   2 daughters; lives in [place]; Pharmacist, 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    Social Determinants of Health   Financial Resource Strain: Not on file  Food Insecurity: Not on file  Transportation Needs: Not on file  Physical Activity: Not on file  Stress: Not on file  Social Connections: Not on file  Intimate Partner Violence: Not on file    FAMILY HISTORY: Family History  Problem Relation Age of Onset   Hypertension Mother    Hypertension Father    Stroke Father    Breast cancer Daughter 78    ALLERGIES:  is allergic to penicillins and drug ingredient [zinc].  MEDICATIONS:  Current Outpatient Medications  Medication Sig Dispense Refill   allopurinol (ZYLOPRIM) 100 MG tablet      amLODipine (NORVASC) 5 MG tablet Take 2.5 mg by mouth daily.   1   anastrozole (ARIMIDEX) 1 MG tablet Take 1 tablet (1 mg total) by mouth daily. DO NOT START if RASH is NOT better 90 tablet 3   aspirin 81 MG EC tablet Take by mouth.     citalopram (CELEXA) 10 MG tablet Take 10 mg by mouth daily.      clopidogrel (PLAVIX) 75 MG tablet TAKE 1 TABLET BY MOUTH EVERY DAY 30 tablet 1   docusate sodium (COLACE) 100 MG capsule Take 100 mg by mouth daily as needed for mild constipation.     DULoxetine (CYMBALTA) 60 MG capsule Take 60 mg by mouth daily.  ergocalciferol (VITAMIN D2) 1.25 MG (50000 UT) capsule Take 1 capsule by mouth once a week.     furosemide (LASIX) 20 MG tablet Take 20 mg by mouth daily.      gabapentin (NEURONTIN) 300 MG capsule Take 300 mg by mouth at bedtime.     loratadine (CLARITIN) 10 MG tablet Take 10 mg by mouth daily as needed for allergies. AM     metoprolol succinate (TOPROL-XL) 25 MG 24 hr tablet Take 37.5 mg by mouth at bedtime.      simvastatin (ZOCOR) 20 MG tablet Take 20 mg by mouth every evening. PM     No current facility-administered medications for this visit.      Marland Kitchen  PHYSICAL EXAMINATION: ECOG  PERFORMANCE STATUS: 0 - Asymptomatic  Vitals:   01/15/21 1526  BP: 134/63  Pulse: 70  Resp: 16  Temp: 99.3 F (37.4 C)  SpO2: 99%   Filed Weights   01/15/21 1526  Weight: 150 lb (68 kg)    Physical Exam Constitutional:      Comments: Walks with a rolling walker.  Alone.  HENT:     Head: Normocephalic and atraumatic.     Mouth/Throat:     Pharynx: No oropharyngeal exudate.  Eyes:     Pupils: Pupils are equal, round, and reactive to light.  Cardiovascular:     Rate and Rhythm: Normal rate and regular rhythm.  Pulmonary:     Effort: Pulmonary effort is normal. No respiratory distress.     Breath sounds: Normal breath sounds. No wheezing.  Abdominal:     General: Bowel sounds are normal. There is no distension.     Palpations: Abdomen is soft. There is no mass.     Tenderness: no abdominal tenderness There is no guarding or rebound.  Musculoskeletal:        General: No tenderness. Normal range of motion.     Cervical back: Normal range of motion and neck supple.  Skin:    General: Skin is warm.     Comments: Bruising bruising noted on the chest and left arm.  Neurological:     Mental Status: She is alert and oriented to person, place, and time.  Psychiatric:        Mood and Affect: Affect normal.    Results for ROMONDA, PARKER (MRN [id number]) as of 01/15/2021 15:37  Ref. Range 08/27/2020 09:55 09/02/2020 12:32 10/22/2020 14:01 12/04/2020 14:35 01/15/2021 14:50  CA 15-3 Latest Ref Range: 0.0 - 25.0 U/mL   32.0 (H) 27.9 (H)   CA 27.29 Latest Ref Range: 0.0 - 38.6 U/mL   38.0 27.8   CEA Latest Ref Range: 0.0 - 4.7 ng/mL   4.1 3.6    LABORATORY DATA:  I have reviewed the data as listed Lab Results  Component Value Date   WBC 6.5 01/15/2021   HGB 12.7 01/15/2021   HCT 37.7 01/15/2021   MCV 96.7 01/15/2021   PLT 185 01/15/2021   Recent Labs    01/18/20 0935 02/26/20 0954 10/22/20 1401 12/04/20 1435 01/15/21 1450  NA 139   < > 138 135 137  K 4.7   < > 4.7 4.3 4.2   CL 103   < > 102 103 104  CO2 26   < > '26 24 25  ' GLUCOSE 127*   < > 158* 172* 147*  BUN 47*   < > 53* 41* 40*  CREATININE 1.83*   < > 1.70* 1.71* 1.70*  CALCIUM 9.5   < >  9.5 9.2 9.5  GFRNONAA 24*   < > 28* 28* 28*  GFRAA 28*  --   --   --   --   PROT 8.1   < > 7.7 7.4 8.0  ALBUMIN 4.4   < > 4.4 4.3 4.5  AST 27   < > '28 23 25  ' ALT 19   < > '20 15 15  ' ALKPHOS 109   < > 71 85 89  BILITOT 0.7   < > 0.4 0.5 0.5   < > = values in this interval not displayed.          RADIOGRAPHIC STUDIES: I have personally reviewed the radiological images as listed and agreed with the findings in the report. No results found.  ASSESSMENT & PLAN:   Carcinoma of upper-inner quadrant of right breast in female, estrogen receptor positive ([place]) There #Synchronous bilateral breast cancer-status post bilateral mastectomies- Right Breast-stage III ER/PR positive HER-2 NEG; & Left breast cancer stage II ER/PR Pos; Her 2 NEG. Currently on anastrozole. April 2022- Bone scan-NEG for cancer. STABLE.   # Continue anastrozole.-Tolerating fairly well-no obvious evidence of recurrence noted.  #Gait instability s/p Fall/discussed my concerns for fractures post fall.  Recommend OT evaluation.   # Mild-moderate arthritic pain-osteoarthritis [bil knee L >R] G-1-2; STABLE;  continue tylenol prn.  Recommend rhema eva; pt want to talk to PCP.   # vit D def [on ergocalciferol]--stable  # CKD- Stage- IV- GFR 27 [Dr.Kolluru]; -stable  # NOV 2021- OsteoporosisT-scoreof -3.7.on ca+vit D. Prolia q 6 months [July, 9311].   # Debility/Falls- recommend evaluation with OT  # DISPOSITION: # referral to [facility]/OT re: falls/ gait training  # follow up in 2 monthss- MD; labs- cbc/cmp; ET62;44; CEA; ca15-3; Dr.B .   All questions were answered. The patient/family knows to call the clinic with any problems, questions or concerns.     Tina Sickle, MD 01/15/2021 5:56 PM

## 2021-01-15 NOTE — Assessment & Plan Note (Addendum)
There #Synchronous bilateral breast cancer-status post bilateral mastectomies- Right Breast-stage III ER/PR positive HER-2 NEG; & Left breast cancer stage II ER/PR Pos; Her 2 NEG. Currently on anastrozole. April 2022- Bone scan-NEG for cancer. STABLE.   # Continue anastrozole.-Tolerating fairly well-no obvious evidence of recurrence noted.  #Gait instability s/p Fall/discussed my concerns for fractures post fall.  Recommend OT evaluation.   # Mild-moderate arthritic pain-osteoarthritis [bil knee L >R] G-1-2; STABLE;  continue tylenol prn.  Recommend rhema eva; pt want to talk to PCP.   # vit D def [on ergocalciferol]--stable  # CKD- Stage- IV- GFR 27 [Dr.Kolluru]; -stable  # NOV 2021- OsteoporosisT-scoreof -3.7.on ca+vit D. Prolia q 6 months [July, 9179].   # Debility/Falls- recommend evaluation with OT  # DISPOSITION: # referral to [facility]/OT re: falls/ gait training  # follow up in 2 monthss- MD; labs- cbc/cmp; XT05;69; CEA; ca15-3; Dr.B .

## 2021-01-15 NOTE — Progress Notes (Signed)
Pt in for follow up, resides at [place].  Pt said had one fall  recently but did not get hurt and was able to get up on own.

## 2021-01-16 LAB — CANCER ANTIGEN 27.29: CA 27.29: 36.1 U/mL (ref 0.0–38.6)

## 2021-01-16 LAB — CANCER ANTIGEN 15-3: CA 15-3: 33.2 U/mL — ABNORMAL HIGH (ref 0.0–25.0)

## 2021-01-16 LAB — CEA: CEA: 3.9 ng/mL (ref 0.0–4.7)

## 2021-01-22 ENCOUNTER — Encounter: Payer: Self-pay | Admitting: Radiation Oncology

## 2021-01-22 ENCOUNTER — Inpatient Hospital Stay: Payer: Medicare PPO | Admitting: Occupational Therapy

## 2021-01-22 ENCOUNTER — Ambulatory Visit
Admission: RE | Admit: 2021-01-22 | Discharge: 2021-01-22 | Disposition: A | Discharge status: Home / Self Care | Payer: Medicare PPO | Source: Ambulatory Visit | Attending: Radiation Oncology | Admitting: Radiation Oncology

## 2021-01-22 VITALS — BP 143/80 | HR 75 | Temp 97.3°F | Resp 16 | Wt 152.3 lb

## 2021-01-22 DIAGNOSIS — C50911 Malignant neoplasm of unspecified site of right female breast: Secondary | ICD-10-CM

## 2021-01-22 DIAGNOSIS — Z79811 Long term (current) use of aromatase inhibitors: Secondary | ICD-10-CM | POA: Insufficient documentation

## 2021-01-22 DIAGNOSIS — Z9013 Acquired absence of bilateral breasts and nipples: Secondary | ICD-10-CM | POA: Insufficient documentation

## 2021-01-22 DIAGNOSIS — Z170 Estrogen receptor positive status [ER+]: Secondary | ICD-10-CM | POA: Diagnosis not present

## 2021-01-22 DIAGNOSIS — C50411 Malignant neoplasm of upper-outer quadrant of right female breast: Secondary | ICD-10-CM | POA: Diagnosis not present

## 2021-01-22 DIAGNOSIS — C50412 Malignant neoplasm of upper-outer quadrant of left female breast: Secondary | ICD-10-CM | POA: Insufficient documentation

## 2021-01-22 DIAGNOSIS — M6281 Muscle weakness (generalized): Secondary | ICD-10-CM

## 2021-01-22 DIAGNOSIS — Z923 Personal history of irradiation: Secondary | ICD-10-CM | POA: Insufficient documentation

## 2021-01-22 NOTE — Progress Notes (Signed)
Radiation Oncology Follow up Note  Name: Tina Curtis   Date:   01/22/2021 MRN:  [id number] [name]: 1927-02-04    This 87 y.o. female presents to the clinic today for 1 year follow-up status post bilateral axillary radiation therapy and patient with stage III (T2 N3 M0) ER/PR positive HER2 negative invasive mammary carcinoma the right breast.  REFERRING PROVIDER: Derinda Late, MD  HPI: Patient is a 87 year old female now out 1 year having pleated bilateral axillary radiation therapy in a patient with known stage III right breast cancer status post bilateral mastectomies.  Seen today in routine follow-up she is doing well.  She specifically denies breast tenderness cough or bone pain.  She has no swelling in her upper extremities.  She has some multiple skin changes compatible with solar keratoses.  She is followed by dermatology.  She is currently on Arimidex tolerant at well without side effect COMPLICATIONS OF TREATMENT: none  FOLLOW UP COMPLIANCE: keeps appointments   PHYSICAL EXAM:  BP (!) 143/80   Pulse 75   Temp (!) 97.3 F (36.3 C) (Tympanic)   Resp 16   Wt 152 lb 4.8 oz (69.1 kg)   BMI 26.14 kg/m  Patient is status post bilateral mastectomies no evidence of chest wall masses or nodularity are noted no axillary or supraclavicular adenopathy is appreciated no lymphedema in her upper extremities is identified.  RADIOLOGY RESULTS: Bone scan from last April was reviewed shows no evidence of metastatic disease PLAN: Present time patient continues to do well 1 year out with no evidence of disease.  On pleased with her overall progress.  She is a low side effect profile.  I have asked to see her back in 1 year for follow-up.  Patient knows to call with any concerns.  I would like to take this opportunity to thank you for allowing me to participate in the care of your patient.Noreene Filbert, MD

## 2021-01-22 NOTE — Therapy (Signed)
[facility] [address] [place], [place], [place], [address] Phone: [phone]   Fax:  [phone]  Occupational Therapy Screen:  Patient Details  Name: Tina Curtis MRN: [id number] Date of Birth: 1927-03-14 No data recorded  Encounter Date: 01/22/2021   OT End of Session - 01/22/21 1843     Visit Number 0             Past Medical History:  Diagnosis Date   Arthritis    Gout   Chronic kidney disease    Diarrhea    Hypercholesteremia    Hypertension    Neuropathy    feet and lower legs   Seasonal allergies    Skin cancer    face    Past Surgical History:  Procedure Laterality Date   ABDOMINAL HYSTERECTOMY  2000   APPENDECTOMY     BREAST BIOPSY Right 09/19/2019   affirm bx of calcs UOQ, x marker, path pending   BREAST BIOPSY Right 09/19/2019   [place] bx of mass,heart marker, path pending   BREAST BIOPSY Right 09/19/2019   [place] bx of LN, coil marker, path pending   CATARACT EXTRACTION Left    CATARACT EXTRACTION W/PHACO Right 10/24/2014   Procedure: CATARACT EXTRACTION PHACO AND INTRAOCULAR LENS PLACEMENT ([place]);  Surgeon: Leandrew Koyanagi, MD;  Location: [facility];  Service: Ophthalmology;  Laterality: Right;   ESOPHAGOGASTRODUODENOSCOPY N/A 03/07/2018   Procedure: ESOPHAGOGASTRODUODENOSCOPY (EGD);  Surgeon: Lin Landsman, MD;  Location[facility] ENDOSCOPY;  Service: Gastroenterology;  Laterality: N/A;   MASTECTOMY MODIFIED RADICAL Right 10/13/2019   Procedure: MASTECTOMY MODIFIED RADICAL;  Surgeon: Robert Bellow, MD;  Location: [facility] OR[facility];  Service: General;  Laterality: Right;   RENAL ANGIOGRAPHY Right 04/11/2018   Procedure: RENAL ANGIOGRAPHY;  Surgeon: Algernon Huxley, MD;  Location: [facility] CV LAB;  Service: Cardiovascular;  Laterality: Right;   SIMPLE MASTECTOMY WITH AXILLARY SENTINEL NODE BIOPSY Left 10/13/2019   Procedure: SIMPLE MASTECTOMY TRUE CUT BIOPSY, SENTINEL NODE BIOPSY;   Surgeon: Robert Bellow, MD;  Location: [facility];  Service: General;  Laterality: Left;    There were no vitals filed for this visit.   Subjective Assessment - 01/22/21 1841     Subjective  I fell about 2 months ago - but was coming in the door with my walker after walking -and looked down on my watch and think I lost my balance - I am not good backing up or turning - otherwise feeling okay- walk about 1/2 mile to mile 2 x day with my walker               DR Rogue Bussing ASSESSMENT & PLAN of Sept 7th, 2022:    Carcinoma of upper-inner quadrant of right breast in female, estrogen receptor positive ([place]) There #Synchronous bilateral breast cancer-status post bilateral mastectomies- Right Breast-stage III ER/PR positive HER-2 NEG; & Left breast cancer stage II ER/PR Pos; Her 2 NEG. Currently on anastrozole. April 2022- Bone scan-NEG for cancer. STABLE.    # Continue anastrozole.-Tolerating fairly well-no obvious evidence of recurrence noted.   #Gait instability s/p Fall/discussed my concerns for fractures post fall.  Recommend OT evaluation.    # Mild-moderate arthritic pain-osteoarthritis [bil knee L >R] G-1-2; STABLE;  continue tylenol prn.  Recommend rhema eva; pt want to talk to PCP.    # vit D def [on ergocalciferol]--stable   # CKD- Stage- IV- GFR 27 [Dr.Kolluru]; -stable   # NOV 2021- OsteoporosisT-scoreof -3.7.on ca+vit  D. Prolia q 6 months [July, 7092].    # Debility/Falls- recommend evaluation with OT   # DISPOSITION: # referral to [facility]/OT re: falls/ gait training   # follow up in 2 monthss- MD; labs- cbc/cmp; ca27;29; CEA; ca15-3; Dr.B   OT SCREEN 01/22/21: Pt refer for fear of falling - fell about 2 months ago - lives at [facility] independent - and walk with roll aid walker Pt walk very independent and fast  Did BERG BALANCE test and scored 41/56- on border of low to medium risk for falling Pt doing well but sit<> stand needs arm support , and standing one  leg unable and tandem standing Trouble mostly when have to turn or back up  Pt provided some some HEP for side stepping at kitchen counter - but hold on - 1-2 min  Standing at counter and tap toe left and Right - for ABD of hip ,and extention -BUT hold onto counter - 1-2 min each  And cont to participate in exercises classes at [facility] -sounds like she is doing one class that is done by PT- pt to talk to PT there about things she can do at home for balance - can contact me as needed if wants to follow up in month                                 Patient will benefit from skilled therapeutic intervention in order to improve the following deficits and impairments:           Visit Diagnosis: Muscle weakness (generalized)    Problem List Patient Active Problem List   Diagnosis Date Noted   Osteoporosis of forearm 03/28/2020   Breast cancer ([place]) 10/13/2019   Carcinoma of upper-inner quadrant of right breast in female, estrogen receptor positive ([place]) 09/27/2019   Anemia in chronic kidney disease 02/01/2019   Atherosclerosis of renal artery ([place]) 02/01/2019   Benign hypertensive kidney disease with chronic kidney disease 02/01/2019   Hyposmolality and/or hyponatremia 02/01/2019   Proteinuria 02/01/2019   Secondary hyperparathyroidism of renal origin ([place]) 02/01/2019   Chronic kidney disease, stage IV (severe) ([place]) 04/01/2018   Renovascular hypertension 04/01/2018   Renal artery stenosis ([place]) 04/01/2018   Nausea & vomiting 03/05/2018   AKI (acute kidney injury) ([place]) 02/17/2018   Acquired hammer toe of right foot 08/14/2014   Bunion of right foot 08/14/2014   Gout 08/14/2014   History of Clostridium difficile colitis 08/14/2014   Pure hypercholesterolemia 08/14/2014   RLS (restless legs syndrome) 08/14/2014   Hallux rigidus of right foot 04/24/2014   Primary osteoarthritis of foot 09/01/2013   Fatigue 09/30/2012   Gluteus medius or minimus  syndrome 08/23/2012   Trochanteric bursitis 08/12/2012   Benign essential hypertension 09/03/2011   Diverticulosis of colon 09/03/2011   Osteopenia 09/03/2011   Enthesopathy of ankle and tarsus 04/13/2011   Osteoarthritis of spine with radiculopathy, lumbosacral region 09/30/2010   Allergic rhinitis 10/31/1928    Rosalyn Gess, OT/L 01/22/2021, 6:44 PM  [facility] [address], [place], [place], [address] Phone: [phone]   Fax:  [phone]  Name: NARI VANNATTER MRN: [id number] Date of Birth: 11-May-1927

## 2021-02-03 ENCOUNTER — Other Ambulatory Visit: Payer: Self-pay

## 2021-02-03 ENCOUNTER — Ambulatory Visit: Payer: Medicare PPO | Admitting: Podiatry

## 2021-02-03 ENCOUNTER — Encounter: Payer: Self-pay | Admitting: Podiatry

## 2021-02-03 DIAGNOSIS — M205X1 Other deformities of toe(s) (acquired), right foot: Secondary | ICD-10-CM | POA: Diagnosis not present

## 2021-02-03 DIAGNOSIS — M205X9 Other deformities of toe(s) (acquired), unspecified foot: Secondary | ICD-10-CM

## 2021-02-03 NOTE — Patient Instructions (Signed)
Leave bandage in place and dry for 4 days, then remove. You may wash foot normally after removal of bandage. DO NOT SOAK FOOT! Dry completely afterwards and may use a bandaid over incision if needed. We will follow up with you in 1 weeks for recheck.  

## 2021-02-03 NOTE — Progress Notes (Signed)
She presents today for follow-up of her hammertoe fourth right we plan to do a tenotomy today to the extensor tendon fourth digit right foot.  This should help reduce the pain on the plantar aspect of the foot.  Objective: Pulses are strong and palpable.  No erythema edema cellulitis drainage or odor severely contracted fourth metatarsal phalangeal joint dorsally.  Tendon is extended and pronounced.  Assessment: Extensor tendon contracture hammertoe deformity fourth right.  Plan: After signed consent today performed a injection of local anesthetic around the extensor tendon consisting of lidocaine.  I then prepped with Betadine.  In a very small less than 3 mm stab incision was made to the extensor tendon and it was released.  The toe decreased his dorsiflexion and laid down quite nicely.  We used Dermabond and dressed with a dry sterile compressive dressing and a Darco shoe follow-up with her in a week let her get back to her regular routine.  May need to use a Darco splint to bring the toe down a little more.

## 2021-02-04 ENCOUNTER — Telehealth: Payer: Self-pay | Admitting: *Deleted

## 2021-02-04 NOTE — Telephone Encounter (Signed)
"  My aunt saw Dr. Milinda Pointer yesterday.  I left a blue women's jacket there.  I either left it in the exam room or in the waiting room.  Can someone call me?"    I called and informed her that we did not find a blue jacket here.

## 2021-02-10 ENCOUNTER — Ambulatory Visit (INDEPENDENT_AMBULATORY_CARE_PROVIDER_SITE_OTHER): Payer: Medicare PPO | Admitting: Podiatry

## 2021-02-10 ENCOUNTER — Other Ambulatory Visit: Payer: Self-pay

## 2021-02-10 DIAGNOSIS — M205X9 Other deformities of toe(s) (acquired), unspecified foot: Secondary | ICD-10-CM

## 2021-02-10 NOTE — Progress Notes (Signed)
  Subjective:  Patient ID: Tina Curtis, female    [name]: 1926/07/11,  MRN: [id number]  Chief Complaint  Patient presents with   Hammer Toe     F/U 4TH TOE RT      87 y.o. female returns for post-op check.  She had a tenotomy of the right fourth toe with Dr. Milinda Pointer 1 week ago  Review of Systems: Negative except as noted in the HPI. Denies N/V/F/Ch.   Objective:  There were no vitals filed for this visit. There is no height or weight on file to calculate BMI. Constitutional Well developed. Well nourished.  Vascular Foot warm and well perfused. Capillary refill normal to all digits.   Neurologic Normal speech. Oriented to person, place, and time. Epicritic sensation to light touch grossly present bilaterally.  Dermatologic Skin healing well without signs of infection. Skin edges well coapted without signs of infection.  Some ecchymosis in the toes  Orthopedic: Tenderness to palpation noted about the surgical site.    Assessment:   1. Contracture of toe joint    Plan:  Patient was evaluated and treated and all questions answered.  Doing well advised her she can come out of the shoe at this point and does not need any bandages or dressings and can bathe normally.  She has an appointment scheduled Dr. Milinda Pointer next month and we will follow-up with him at that time.  I dispensed her more aperture pads to offload the plantar lesion.  Return for follow up with Dr Milinda Pointer at scheduled appointment .

## 2021-03-04 ENCOUNTER — Encounter (INDEPENDENT_AMBULATORY_CARE_PROVIDER_SITE_OTHER): Payer: Self-pay | Admitting: Vascular Surgery

## 2021-03-04 ENCOUNTER — Other Ambulatory Visit: Payer: Self-pay

## 2021-03-04 ENCOUNTER — Ambulatory Visit (INDEPENDENT_AMBULATORY_CARE_PROVIDER_SITE_OTHER): Payer: Medicare PPO

## 2021-03-04 ENCOUNTER — Ambulatory Visit (INDEPENDENT_AMBULATORY_CARE_PROVIDER_SITE_OTHER): Payer: Medicare PPO | Admitting: Vascular Surgery

## 2021-03-04 VITALS — BP 114/72 | HR 73 | Resp 16 | Wt 148.4 lb

## 2021-03-04 DIAGNOSIS — I150 Renovascular hypertension: Secondary | ICD-10-CM

## 2021-03-04 DIAGNOSIS — I10 Essential (primary) hypertension: Secondary | ICD-10-CM

## 2021-03-04 DIAGNOSIS — I701 Atherosclerosis of renal artery: Secondary | ICD-10-CM

## 2021-03-04 DIAGNOSIS — E7800 Pure hypercholesterolemia, unspecified: Secondary | ICD-10-CM

## 2021-03-04 NOTE — Progress Notes (Signed)
MRN : [id number]  Tina Curtis is a 87 y.o. (10-Mar-1927) female who presents with chief complaint of  Chief Complaint  Patient presents with   Follow-up    Ultrasound follow up  .  History of Present Illness: Patient returns today in follow up of her renal artery stenosis.  Her blood pressure continues to be good and she is doing well.  She has no new complaints today.  Her neuropathy is her biggest issue and she follows with neurology.  She also follows with nephrology and as far she knows her renal function has been stable. Duplex today shows a right renal artery stent to be widely patent without significant stenosis and a solitary right kidney.   Current Outpatient Medications  Medication Sig Dispense Refill   allopurinol (ZYLOPRIM) 100 MG tablet      amLODipine (NORVASC) 5 MG tablet Take 2.5 mg by mouth daily.   1   anastrozole (ARIMIDEX) 1 MG tablet Take 1 tablet (1 mg total) by mouth daily. DO NOT START if RASH is NOT better 90 tablet 3   aspirin 81 MG EC tablet Take by mouth.     citalopram (CELEXA) 10 MG tablet Take 10 mg by mouth daily.      clopidogrel (PLAVIX) 75 MG tablet TAKE 1 TABLET BY MOUTH EVERY DAY 30 tablet 1   docusate sodium (COLACE) 100 MG capsule Take 100 mg by mouth daily as needed for mild constipation.     DULoxetine (CYMBALTA) 60 MG capsule Take 60 mg by mouth daily.      ergocalciferol (VITAMIN D2) 1.25 MG (50000 UT) capsule Take 1 capsule by mouth once a week.     furosemide (LASIX) 20 MG tablet Take 20 mg by mouth daily.      gabapentin (NEURONTIN) 300 MG capsule Take 300 mg by mouth at bedtime.     loratadine (CLARITIN) 10 MG tablet Take 10 mg by mouth daily as needed for allergies. AM     metoprolol succinate (TOPROL-XL) 25 MG 24 hr tablet Take 37.5 mg by mouth at bedtime.      simvastatin (ZOCOR) 20 MG tablet Take 20 mg by mouth every evening. PM     No current facility-administered medications for this visit.    Past Medical History:  Diagnosis  Date   Arthritis    Gout   Chronic kidney disease    Diarrhea    Hypercholesteremia    Hypertension    Neuropathy    feet and lower legs   Seasonal allergies    Skin cancer    face    Past Surgical History:  Procedure Laterality Date   ABDOMINAL HYSTERECTOMY  2000   APPENDECTOMY     BREAST BIOPSY Right 09/19/2019   affirm bx of calcs UOQ, x marker, path pending   BREAST BIOPSY Right 09/19/2019   [place] bx of mass,heart marker, path pending   BREAST BIOPSY Right 09/19/2019   [place] bx of LN, coil marker, path pending   CATARACT EXTRACTION Left    CATARACT EXTRACTION W/PHACO Right 10/24/2014   Procedure: CATARACT EXTRACTION PHACO AND INTRAOCULAR LENS PLACEMENT ([place]);  Surgeon: Leandrew Koyanagi, MD;  Location: [place];  Service: Ophthalmology;  Laterality: Right;   ESOPHAGOGASTRODUODENOSCOPY N/A 03/07/2018   Procedure: ESOPHAGOGASTRODUODENOSCOPY (EGD);  Surgeon: Lin Landsman, MD;  Location: [facility] ENDOSCOPY;  Service: Gastroenterology;  Laterality: N/A;   MASTECTOMY MODIFIED RADICAL Right 10/13/2019   Procedure: MASTECTOMY MODIFIED RADICAL;  Surgeon: Robert Bellow, MD;  Location: [facility];  Service: General;  Laterality: Right;   RENAL ANGIOGRAPHY Right 04/11/2018   Procedure: RENAL ANGIOGRAPHY;  Surgeon: Algernon Huxley, MD;  Location: [place] [facility];  Service: Cardiovascular;  Laterality: Right;   SIMPLE MASTECTOMY WITH AXILLARY SENTINEL NODE BIOPSY Left 10/13/2019   Procedure: SIMPLE MASTECTOMY TRUE CUT BIOPSY, SENTINEL NODE BIOPSY;  Surgeon: Robert Bellow, MD;  Location: [facility];  Service: General;  Laterality: Left;     Social History   Tobacco Use   Smoking status: Never   Smokeless tobacco: Never  Substance Use Topics   Alcohol use: No   Drug use: Never      Family History  Problem Relation Age of Onset   Hypertension Mother    Hypertension Father    Stroke Father    Breast cancer Daughter 54     Allergies  Allergen Reactions    Penicillins Anaphylaxis   Drug Ingredient [Zinc]     REVIEW OF SYSTEMS (Negative unless checked)   Constitutional: [] Weight loss  [] Fever  [] Chills Cardiac: [] Chest pain   [] Chest pressure   [] Palpitations   [] Shortness of breath when laying flat   [] Shortness of breath at rest   [] Shortness of breath with exertion. Vascular:  [] Pain in legs with walking   [] Pain in legs at rest   [] Pain in legs when laying flat   [] Claudication   [] Pain in feet when walking  [] Pain in feet at rest  [] Pain in feet when laying flat   [] History of DVT   [] Phlebitis   [] Swelling in legs   [] Varicose veins   [] Non-healing ulcers Pulmonary:   [] Uses home oxygen   [] Productive cough   [] Hemoptysis   [] Wheeze  [] COPD   [] Asthma Neurologic:  [] Dizziness  [] Blackouts   [] Seizures   [] History of stroke   [] History of TIA  [] Aphasia   [] Temporary blindness   [] Dysphagia   [] Weakness or numbness in arms   [x] Weakness or numbness in legs Musculoskeletal:  [x] Arthritis   [] Joint swelling   [x] Joint pain   [] Low back pain Hematologic:  [] Easy bruising  [] Easy bleeding   [] Hypercoagulable state   [] Anemic  [] Hepatitis Gastrointestinal:  [] Blood in stool   [] Vomiting blood  [] Gastroesophageal reflux/heartburn   [] Abdominal pain Genitourinary:  [x] Chronic kidney disease   [x] Difficult urination  [] Frequent urination  [] Burning with urination   [] Hematuria Skin:  [] Rashes   [] Ulcers   [] Wounds Psychological:  [] History of anxiety   []  History of major depression.  Physical Examination  BP 114/72 (BP Location: Right Arm)   Pulse 73   Resp 16   Wt 148 lb 6.4 oz (67.3 kg)   BMI 25.47 kg/m  Gen:  WD/WN, NAD.  Appears younger than stated age. Head: [place]/AT, No temporalis wasting. Ear/Nose/Throat: Hearing grossly intact, nares w/o erythema or drainage Eyes: Conjunctiva clear. Sclera non-icteric Neck: Supple.  Trachea midline Pulmonary:  Good air movement, no use of accessory muscles.  Cardiac: RRR, no JVD Vascular:  Vessel  Right Left  Radial Palpable Palpable           Musculoskeletal: M/S 5/5 throughout.  No deformity or atrophy.  Walks with a walker.  No significant lower extremity edema. Neurologic: Sensation grossly intact in extremities.  Symmetrical.  Speech is fluent.  Psychiatric: Judgment intact, Mood & affect appropriate for pt's clinical situation. Dermatologic: No rashes or ulcers noted.  No cellulitis or open wounds.      Labs Recent Results (from the past 2160 hour(s))  Cancer antigen 15-3     Status: Abnormal   Collection Time: 12/04/20  2:35 PM  Result Value Ref Range   CA 15-3 27.9 (H) 0.0 - 25.0 U/mL    Comment: (NOTE) Roche [employer]ostics Electrochemiluminescence Immunoassay (ECLIA) Values obtained with different assay methods or kits cannot be used interchangeably.  Results cannot be interpreted as absolute evidence of the presence or absence of malignant disease. Performed At: [facility] [place], [place] [id number] Rush Farmer MD [id number]   CEA     Status: None   Collection Time: 12/04/20  2:35 PM  Result Value Ref Range   CEA 3.6 0.0 - 4.7 ng/mL    Comment: (NOTE)                             Nonsmokers          <3.9                             Smokers             <5.6 Roche [employer]ostics Electrochemiluminescence Immunoassay (ECLIA) Values obtained with different assay methods or kits cannot be used interchangeably.  Results cannot be interpreted as absolute evidence of the presence or absence of malignant disease. Performed At: [facility] [place], [place] [id number] Rush Farmer MD [id number]   Cancer antigen 27.29     Status: None   Collection Time: 12/04/20  2:35 PM  Result Value Ref Range   CA 27.29 27.8 0.0 - 38.6 U/mL    Comment: (NOTE) Siemens Centaur Immunochemiluminometric Methodology [facility]) Values obtained with different assay methods or kits cannot be used interchangeably. Results cannot be  interpreted as absolute evidence of the presence or absence of malignant disease. Performed At: [facility] [place], [place] [id number] Rush Farmer MD [id number]   Comprehensive metabolic panel     Status: Abnormal   Collection Time: 12/04/20  2:35 PM  Result Value Ref Range   Sodium 135 135 - 145 mmol/L   Potassium 4.3 3.5 - 5.1 mmol/L   Chloride 103 98 - 111 mmol/L   CO2 24 22 - 32 mmol/L   Glucose, Bld 172 (H) 70 - 99 mg/dL    Comment: Glucose reference range applies only to samples taken after fasting for at least 8 hours.   BUN 41 (H) 8 - 23 mg/dL   Creatinine, Ser 1.71 (H) 0.44 - 1.00 mg/dL   Calcium 9.2 8.9 - 10.3 mg/dL   Total Protein 7.4 6.5 - 8.1 g/dL   Albumin 4.3 3.5 - 5.0 g/dL   AST 23 15 - 41 U/L   ALT 15 0 - 44 U/L   Alkaline Phosphatase 85 38 - 126 U/L   Total Bilirubin 0.5 0.3 - 1.2 mg/dL   GFR, Estimated 28 (L) >60 mL/min    Comment: (NOTE) Calculated using the CKD-EPI Creatinine Equation (2021)    Anion gap 8 5 - 15    Comment: Performed at [facility], [address] [place], [place] [address]  CBC with Differential/Platelet     Status: Abnormal   Collection Time: 12/04/20  2:35 PM  Result Value Ref Range   WBC 7.5 4.0 - 10.5 K/uL   RBC 3.72 (L) 3.87 - 5.11 MIL/uL   Hemoglobin 12.2 12.0 - 15.0 g/dL   HCT 35.8 (L) 36.0 - 46.0 %  MCV 96.2 80.0 - 100.0 fL   MCH 32.8 26.0 - 34.0 pg   MCHC 34.1 30.0 - 36.0 g/dL   RDW 13.0 11.5 - 15.5 %   Platelets 174 150 - 400 K/uL   nRBC 0.0 0.0 - 0.2 %   Neutrophils Relative % 60 %   Neutro Abs 4.5 1.7 - 7.7 K/uL   Lymphocytes Relative 24 %   Lymphs Abs 1.8 0.7 - 4.0 K/uL   Monocytes Relative 11 %   Monocytes Absolute 0.8 0.1 - 1.0 K/uL   Eosinophils Relative 4 %   Eosinophils Absolute 0.3 0.0 - 0.5 K/uL   Basophils Relative 1 %   Basophils Absolute 0.1 0.0 - 0.1 K/uL   Immature Granulocytes 0 %   Abs Immature Granulocytes 0.03 0.00 - 0.07 K/uL    Comment: Performed at  [facility], [address] [place], [place] [address]  Cancer antigen 27.29     Status: None   Collection Time: 01/15/21  2:49 PM  Result Value Ref Range   CA 27.29 36.1 0.0 - 38.6 U/mL    Comment: (NOTE) Siemens Centaur Immunochemiluminometric Methodology (ICMA) Values obtained with different assay methods or kits cannot be used interchangeably. Results cannot be interpreted as absolute evidence of the presence or absence of malignant disease. Performed At: [facility] [place], [place] [id number] Rush Farmer MD [id number]   Cancer antigen 15-3     Status: Abnormal   Collection Time: 01/15/21  2:50 PM  Result Value Ref Range   CA 15-3 33.2 (H) 0.0 - 25.0 U/mL    Comment: (NOTE) Roche Diagnostics Electrochemiluminescence Immunoassay (ECLIA) Values obtained with different assay methods or kits cannot be used interchangeably.  Results cannot be interpreted as absolute evidence of the presence or absence of malignant disease. Performed At: [facility] [place], [place] [id number] Rush Farmer MD [id number]   CEA     Status: None   Collection Time: 01/15/21  2:50 PM  Result Value Ref Range   CEA 3.9 0.0 - 4.7 ng/mL    Comment: (NOTE)                             Nonsmokers          <3.9                             Smokers             <5.6 Roche [employer]ostics Electrochemiluminescence Immunoassay (ECLIA) Values obtained with different assay methods or kits cannot be used interchangeably.  Results cannot be interpreted as absolute evidence of the presence or absence of malignant disease. Performed At: [facility] [place], [place] [id number] Rush Farmer MD [id number]   Comprehensive metabolic panel     Status: Abnormal   Collection Time: 01/15/21  2:50 PM  Result Value Ref Range   Sodium 137 135 - 145 mmol/L   Potassium 4.2 3.5 - 5.1 mmol/L   Chloride 104 98 - 111 mmol/L   CO2 25 22  - 32 mmol/L   Glucose, Bld 147 (H) 70 - 99 mg/dL    Comment: Glucose reference range applies only to samples taken after fasting for at least 8 hours.   BUN 40 (H) 8 - 23 mg/dL   Creatinine, Ser 1.70 (H) 0.44 - 1.00 mg/dL   Calcium 9.5 8.9 -  10.3 mg/dL   Total Protein 8.0 6.5 - 8.1 g/dL   Albumin 4.5 3.5 - 5.0 g/dL   AST 25 15 - 41 U/L   ALT 15 0 - 44 U/L   Alkaline Phosphatase 89 38 - 126 U/L   Total Bilirubin 0.5 0.3 - 1.2 mg/dL   GFR, Estimated 28 (L) >60 mL/min    Comment: (NOTE) Calculated using the CKD-EPI Creatinine Equation (2021)    Anion gap 8 5 - 15    Comment: Performed at [facility] [address] [place], [place] [address]  CBC with Differential     Status: None   Collection Time: 01/15/21  2:50 PM  Result Value Ref Range   WBC 6.5 4.0 - 10.5 K/uL   RBC 3.90 3.87 - 5.11 MIL/uL   Hemoglobin 12.7 12.0 - 15.0 g/dL   HCT 37.7 36.0 - 46.0 %   MCV 96.7 80.0 - 100.0 fL   MCH 32.6 26.0 - 34.0 pg   MCHC 33.7 30.0 - 36.0 g/dL   RDW 13.6 11.5 - 15.5 %   Platelets 185 150 - 400 K/uL   nRBC 0.0 0.0 - 0.2 %   Neutrophils Relative % 55 %   Neutro Abs 3.6 1.7 - 7.7 K/uL   Lymphocytes Relative 28 %   Lymphs Abs 1.9 0.7 - 4.0 K/uL   Monocytes Relative 12 %   Monocytes Absolute 0.8 0.1 - 1.0 K/uL   Eosinophils Relative 3 %   Eosinophils Absolute 0.2 0.0 - 0.5 K/uL   Basophils Relative 1 %   Basophils Absolute 0.1 0.0 - 0.1 K/uL   Immature Granulocytes 1 %   Abs Immature Granulocytes 0.03 0.00 - 0.07 K/uL    Comment: Performed at [facility], [address], [place], [place] [address]    Radiology No results found.  Assessment/Plan Pure hypercholesterolemia lipid control important in reducing the progression of atherosclerotic disease. Continue statin therapy   Renovascular hypertension Blood pressure control markedly improved after intervention  Renal artery stenosis (HCC) Duplex today shows a right renal artery stent to be widely patent without  significant stenosis and a solitary right kidney.  Blood pressures continue to be good and her renal function is stable.  Follows with nephrology.  Continue to recheck in 6 months.    Leotis Pain, MD  03/04/2021 10:36 AM    This note was created with Dragon medical transcription system.  Any errors from dictation are purely unintentional

## 2021-03-04 NOTE — Assessment & Plan Note (Signed)
Duplex today shows a right renal artery stent to be widely patent without significant stenosis and a solitary right kidney.  Blood pressures continue to be good and her renal function is stable.  Follows with nephrology.  Continue to recheck in 6 months.

## 2021-03-12 ENCOUNTER — Other Ambulatory Visit: Payer: Self-pay | Admitting: *Deleted

## 2021-03-12 DIAGNOSIS — C50211 Malignant neoplasm of upper-inner quadrant of right female breast: Secondary | ICD-10-CM

## 2021-03-12 DIAGNOSIS — Z170 Estrogen receptor positive status [ER+]: Secondary | ICD-10-CM

## 2021-03-14 NOTE — Progress Notes (Signed)
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 Burnett] stage III T2N3 [19/24 LN]; ER/PR positive HER-2 negative.   # Left breast-invasive mammary carcinoma-status post mastectomy pT1cN1a; stage I-ER/PR positive HER-2 negative  #November 23, 2019-start adjuvant radiation bilateral [finished August 31]; no chemo;   #September 22nd 2021- anastrozole ----------------------------------------------------------------------------------------   A. RIGHT BREAST, 3:00; ULTRASOUND-GUIDED BIOPSY:  - INVASIVE MAMMARY CARCINOMA WITH MICROPAPILLARY FEATURES; Size of invasive carcinoma: 32m in this sample  Histologic grade of invasive carcinoma: Grade 2 ; ER/PR > 905: her 2- NEG  A. RIGHT BREAST, 3:00; ULTRASOUND-GUIDED BIOPSY: - INVASIVE MAMMARY CARCINOMA WITH MICROPAPILLARY FEATURES. 12min this sample. Grade 2. Lymphovascular invasion: Present.   B. LYMPH NODE, RIGHT AXILLARY; ULTRASOUND-GUIDED NEEDLE CORE BIOPSY: - METASTATIC MAMMARY CARCINOMA, 6 MM IN THIS SAMPLE.   C. RIGHT BREAST, LATERAL CALCIFICATIONS; STEREOTACTIC BIOPSY: - DUCTAL CARCINOMA IN SITU, INTERMEDIATE GRADE, WITH CALCIFICATIONS. -------------------------------------------------------------   # CKD- Stage III-IV [Dr.Kolluru]  # SURVIVORSHIP:   # GENETICS:   DIAGNOSIS:  Right breast cancer stage III;  left breast cancer-stage I GOALS: Cure  CURRENT/MOST RECENT THERAPY : Anastrozole    Carcinoma of  upper-inner quadrant of right breast in female, estrogen receptor positive ([facility] 09/27/2019 Initial Diagnosis   Carcinoma of upper-inner quadrant of right breast in female, estrogen receptor positive ([facility]     HISTORY OF PRESENTING ILLNESS: Ambulating walking with a rolling walker ; alone.   Tina FEESER87.o.  female synchronous bilateral breast cancer [right stage III ER/PR positive;Her2 NEG; left stage I ER/PR positive; Her2- NEG] s/p adjuvant radiation; currently on Anastrazole is here for follow-up.  No falls.  Complains of fatigue.  Difficulty sleeping at night because multiple trips to the bathroom/chronic.  Also complains of urinary incontinence again chronic.   Continues to complain of mild arthritic pain.  Not any worse.  Otherwise no chest pain or shortness of breath or cough.  No headaches.  Review of Systems  Constitutional:  Positive for malaise/fatigue. Negative for chills, diaphoresis, fever and weight loss.  HENT:  Negative for nosebleeds and sore throat.   Eyes:  Negative for double vision.  Respiratory:  Negative for cough, hemoptysis, sputum production, shortness of breath and wheezing.   Cardiovascular:  Negative for chest pain, palpitations, orthopnea and leg swelling.  Gastrointestinal:  Negative for abdominal pain, blood in stool, constipation, diarrhea, heartburn, melena, nausea and vomiting.  Genitourinary:  Negative for dysuria, frequency and urgency.  Musculoskeletal:  Positive for back pain and joint pain.  Neurological:  Negative for dizziness, tingling, focal weakness, weakness and headaches.  Endo/Heme/Allergies:  Does not bruise/bleed easily.  Psychiatric/Behavioral:  Negative for depression. The patient is not nervous/anxious and does not have insomnia.     MEDICAL HISTORY:  Past Medical History:  Diagnosis Date   Arthritis    Gout   Chronic kidney disease    Diarrhea    Hypercholesteremia    Hypertension    Neuropathy    feet and lower  legs   Seasonal allergies    Skin cancer    face  SURGICAL HISTORY: Past Surgical History:  Procedure Laterality Date   ABDOMINAL HYSTERECTOMY  2000   APPENDECTOMY     BREAST BIOPSY Right 09/19/2019   affirm bx of calcs UOQ, x marker, path pending   BREAST BIOPSY Right 09/19/2019   [place] bx of mass,heart marker, path pending   BREAST BIOPSY Right 09/19/2019   [place] bx of LN, coil marker, path pending   CATARACT EXTRACTION Left    CATARACT EXTRACTION W/PHACO Right 10/24/2014   Procedure: CATARACT EXTRACTION PHACO AND INTRAOCULAR LENS PLACEMENT ([place]);  Surgeon: Leandrew Koyanagi, MD;  Location: [facility];  Service: Ophthalmology;  Laterality: Right;   ESOPHAGOGASTRODUODENOSCOPY N/A 03/07/2018   Procedure: ESOPHAGOGASTRODUODENOSCOPY (EGD);  Surgeon: Lin Landsman, MD;  Location: [facility] ENDOSCOPY;  Service: Gastroenterology;  Laterality: N/A;   MASTECTOMY MODIFIED RADICAL Right 10/13/2019   Procedure: MASTECTOMY MODIFIED RADICAL;  Surgeon: Robert Bellow, MD;  Location: [facility] OR[facility];  Service: General;  Laterality: Right;   RENAL ANGIOGRAPHY Right 04/11/2018   Procedure: RENAL ANGIOGRAPHY;  Surgeon: Algernon Huxley, MD;  Location: [facility];  Service: Cardiovascular;  Laterality: Right;   SIMPLE MASTECTOMY WITH AXILLARY SENTINEL NODE BIOPSY Left 10/13/2019   Procedure: SIMPLE MASTECTOMY TRUE CUT BIOPSY, SENTINEL NODE BIOPSY;  Surgeon: Robert Bellow, MD;  Location: [facility];  Service: General;  Laterality: Left;    SOCIAL HISTORY: Social History   Socioeconomic History   Marital status: Widowed    Spouse name: Not on file   Number of children: Not on file   Years of education: Not on file   Highest education level: Not on file  Occupational History   Not on file  Tobacco Use   Smoking status: Never   Smokeless tobacco: Never  Substance and Sexual Activity   Alcohol use: No   Drug use: Never   Sexual activity: Not on file  Other Topics Concern   Not on  file  Social History Narrative   2 daughters; lives in [place]; Pharmacist, 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    Social Determinants of Health   Financial Resource Strain: Not on file  Food Insecurity: Not on file  Transportation Needs: Not on file  Physical Activity: Not on file  Stress: Not on file  Social Connections: Not on file  Intimate Partner Violence: Not on file    FAMILY HISTORY: Family History  Problem Relation Age of Onset   Hypertension Mother    Hypertension Father    Stroke Father    Breast cancer Daughter 54    ALLERGIES:  is allergic to penicillins and drug ingredient [zinc].  MEDICATIONS:  Current Outpatient Medications  Medication Sig Dispense Refill   allopurinol (ZYLOPRIM) 100 MG tablet      amLODipine (NORVASC) 5 MG tablet Take 2.5 mg by mouth daily.   1   anastrozole (ARIMIDEX) 1 MG tablet Take 1 tablet (1 mg total) by mouth daily. DO NOT START if RASH is NOT better 90 tablet 3   aspirin 81 MG EC tablet Take by mouth.     citalopram (CELEXA) 10 MG tablet Take 10 mg by mouth daily.      clopidogrel (PLAVIX) 75 MG tablet TAKE 1 TABLET BY MOUTH EVERY DAY 30 tablet 1   docusate sodium (COLACE) 100 MG capsule Take 100 mg by mouth daily as needed for mild constipation.     DULoxetine (CYMBALTA) 60 MG capsule Take 60 mg by mouth daily.      ergocalciferol (VITAMIN D2)  1.25 MG (50000 UT) capsule Take 1 capsule by mouth once a week.     furosemide (LASIX) 20 MG tablet Take 20 mg by mouth daily.      gabapentin (NEURONTIN) 300 MG capsule Take 300 mg by mouth at bedtime.     loratadine (CLARITIN) 10 MG tablet Take 10 mg by mouth daily as needed for allergies. AM     metoprolol succinate (TOPROL-XL) 25 MG 24 hr tablet Take 37.5 mg by mouth at bedtime.      simvastatin (ZOCOR) 20 MG tablet Take 20 mg by mouth every evening. PM     No current facility-administered medications for this visit.      Marland Kitchen  PHYSICAL EXAMINATION: ECOG  PERFORMANCE STATUS: 0 - Asymptomatic  Vitals:   03/17/21 1532  BP: 134/77  Pulse: 73  Resp: 17  Temp: (!) 97.4 F (36.3 C)  SpO2: 96%   Filed Weights   03/17/21 1532  Weight: 147 lb (66.7 kg)    Physical Exam Constitutional:      Comments: Walks with a rolling walker.  Alone.  HENT:     Head: Normocephalic and atraumatic.     Mouth/Throat:     Pharynx: No oropharyngeal exudate.  Eyes:     Pupils: Pupils are equal, round, and reactive to light.  Cardiovascular:     Rate and Rhythm: Normal rate and regular rhythm.  Pulmonary:     Effort: Pulmonary effort is normal. No respiratory distress.     Breath sounds: Normal breath sounds. No wheezing.  Abdominal:     General: Bowel sounds are normal. There is no distension.     Palpations: Abdomen is soft. There is no mass.     Tenderness: There is no abdominal tenderness. There is no guarding or rebound.  Musculoskeletal:        General: No tenderness. Normal range of motion.     Cervical back: Normal range of motion and neck supple.  Skin:    General: Skin is warm.     Comments: Bruising bruising noted on the chest and left arm.  Neurological:     Mental Status: She is alert and oriented to person, place, and time.  Psychiatric:        Mood and Affect: Affect normal.    Results for Tina Curtis, Tina Curtis (MRN [id number]) as of 01/15/2021 15:37  Ref. Range 08/27/2020 09:55 09/02/2020 12:32 10/22/2020 14:01 12/04/2020 14:35 01/15/2021 14:50  CA 15-3 Latest Ref Range: 0.0 - 25.0 U/mL   32.0 (H) 27.9 (H)   CA 27.29 Latest Ref Range: 0.0 - 38.6 U/mL   38.0 27.8   CEA Latest Ref Range: 0.0 - 4.7 ng/mL   4.1 3.6    LABORATORY DATA:  I have reviewed the data as listed Lab Results  Component Value Date   WBC 7.2 03/17/2021   HGB 12.9 03/17/2021   HCT 38.6 03/17/2021   MCV 94.4 03/17/2021   PLT 157 03/17/2021   Recent Labs    12/04/20 1435 01/15/21 1450 03/17/21 1514  NA 135 137 140  K 4.3 4.2 4.4  CL 103 104 104  CO2 '24 25 26   ' GLUCOSE 172* 147* 114*  BUN 41* 40* 47*  CREATININE 1.71* 1.70* 1.61*  CALCIUM 9.2 9.5 9.7  GFRNONAA 28* 28* 30*  PROT 7.4 8.0 7.8  ALBUMIN 4.3 4.5 4.6  AST '23 25 26  ' ALT '15 15 18  ' ALKPHOS 85 89 68  BILITOT 0.5 0.5 0.6  RADIOGRAPHIC STUDIES: I have personally reviewed the radiological images as listed and agreed with the findings in the report. VAS US RENAL ARTERY DUPLEX  Result Date: 03/04/2021 ABDOMINAL VISCERAL Patient Name:  Tina Curtis  Date of Exam:   03/04/2021 Medical Rec #: [id number]      Accession #:    [id number] Date of Birth: 05/08/1927     Patient Gender: F Patient Age:   22 years Exam Location:  [facility] Procedure:      VAS US RENAL ARTERY DUPLEX Referring Phys: [id number] [place] -------------------------------------------------------------------------------- Limitations: Air/bowel gas, obesity and Patient not NPO. Comparison Study: 08/25/2020 Performing Technologist: Charlane Ferretti RT (R)(VS)  Examination Guidelines: A complete evaluation includes B-mode imaging, spectral Doppler, color Doppler, and power Doppler as needed of all accessible portions of each vessel. Bilateral testing is considered an integral part of a complete examination. Limited examinations for reoccurring indications may be performed as noted.  Duplex Findings: +----------+--------+--------+------+--------+ MesentericPSV cm/sEDV cm/sPlaqueComments +----------+--------+--------+------+--------+ Aorta Mid    38                          +----------+--------+--------+------+--------+  +------------------+--------+--------+-------+ Right Renal ArteryPSV cm/sEDV cm/sComment +------------------+--------+--------+-------+ Origin               86                   +------------------+--------+--------+-------+ Proximal            120                   +------------------+--------+--------+-------+ Mid                 130                    +------------------+--------+--------+-------+ Distal              138                   +------------------+--------+--------+-------+  +------------------+----+------------------++ Right Kidney          Left Kidney        +------------------+----+------------------++ RAR (manual)      3.63RAR (manual)       +------------------+----+------------------++ Kidney length (cm)9.80Kidney length (cm) +------------------+----+------------------++  Summary: Renal:  Right: Normal size right kidney. RRV flow present. No evidence of        right renal artery stenosis. Left:  Left renal atrophy.  *See table(s) above for measurements and observations.  Diagnosing physician: Leotis Pain MD  Electronically signed by Leotis Pain MD on 03/04/2021 at 12:50:29 PM.    Final     ASSESSMENT & PLAN:   Carcinoma of upper-inner quadrant of right breast in female, estrogen receptor positive ([place])  #Synchronous bilateral breast cancer-status post bilateral mastectomies- Right Breast-stage III ER/PR positive HER-2 NEG; & Left breast cancer stage II ER/PR Pos; Her 2 NEG. Currently on anastrozole. April 2022- Bone scan-NEG for cancer.STABLE  # Continue anastrozole.-Tolerating fairly well-no obvious evidence of recurrence noted.  # Mild-moderate arthritic pain-osteoarthritis [bil knee L >R] G-1-2; STABLE;  continue tylenol prn.  .   # vit D def [on ergocalciferol]--stable  # CKD- Stage- IV- GFR 27 [Dr.Kolluru]; -stable  #Urinary incontinence/ difficulty sleeping at night-recommend urology evaluation.  However patient wants to speak nephrology Dr. Juleen China at this time.  # NOV 2021- OsteoporosisT-scoreof -3.7.on ca+vit D. Prolia q 6 months [July, 2458]. Again in 2023/next visit.   #  DISPOSITION: # follow up in 3 months- MD; labs- cbc/cmp; ca27;29; CEA; ca15-3;Prolia SQ Dr.B  All questions were answered. The patient/family knows to call the clinic with any problems, questions or concerns.     Cammie Sickle, MD 03/17/2021 4:06 PM

## 2021-03-14 NOTE — Assessment & Plan Note (Addendum)
#  Synchronous bilateral breast cancer-status post bilateral mastectomies- Right Breast-stage III ER/PR positive HER-2 NEG; & Left breast cancer stage II ER/PR Pos; Her 2 NEG. Currently on anastrozole. April 2022- Bone scan-NEG for cancer.STABLE  # Continue anastrozole.-Tolerating fairly well-no obvious evidence of recurrence noted.  # Mild-moderate arthritic pain-osteoarthritis [bil knee L >R] G-1-2; STABLE;  continue tylenol prn.  .   # vit D def [on ergocalciferol]--stable  # CKD- Stage- IV- GFR 27 [Dr.Kolluru]; -stable  #Urinary incontinence/ difficulty sleeping at night-recommend urology evaluation.  However patient wants to speak nephrology Dr. Juleen China at this time.  # NOV 2021- OsteoporosisT-scoreof -3.7.on ca+vit D. Prolia q 6 months [July, 7972]. Again in 2023/next visit.   # DISPOSITION: # follow up in 3 months- MD; labs- cbc/cmp; ca27;29; CEA; ca15-3;Prolia SQ Dr.B

## 2021-03-17 ENCOUNTER — Other Ambulatory Visit: Payer: Self-pay

## 2021-03-17 ENCOUNTER — Encounter: Payer: Self-pay | Admitting: Internal Medicine

## 2021-03-17 ENCOUNTER — Inpatient Hospital Stay (HOSPITAL_BASED_OUTPATIENT_CLINIC_OR_DEPARTMENT_OTHER): Payer: Medicare PPO | Admitting: Internal Medicine

## 2021-03-17 ENCOUNTER — Inpatient Hospital Stay: Payer: Medicare PPO | Attending: Internal Medicine

## 2021-03-17 DIAGNOSIS — E559 Vitamin D deficiency, unspecified: Secondary | ICD-10-CM | POA: Diagnosis not present

## 2021-03-17 DIAGNOSIS — C50211 Malignant neoplasm of upper-inner quadrant of right female breast: Secondary | ICD-10-CM

## 2021-03-17 DIAGNOSIS — R32 Unspecified urinary incontinence: Secondary | ICD-10-CM | POA: Insufficient documentation

## 2021-03-17 DIAGNOSIS — M1712 Unilateral primary osteoarthritis, left knee: Secondary | ICD-10-CM | POA: Diagnosis not present

## 2021-03-17 DIAGNOSIS — Z170 Estrogen receptor positive status [ER+]: Secondary | ICD-10-CM

## 2021-03-17 DIAGNOSIS — C50811 Malignant neoplasm of overlapping sites of right female breast: Secondary | ICD-10-CM | POA: Insufficient documentation

## 2021-03-17 DIAGNOSIS — I129 Hypertensive chronic kidney disease with stage 1 through stage 4 chronic kidney disease, or unspecified chronic kidney disease: Secondary | ICD-10-CM | POA: Insufficient documentation

## 2021-03-17 DIAGNOSIS — Z79811 Long term (current) use of aromatase inhibitors: Secondary | ICD-10-CM | POA: Diagnosis not present

## 2021-03-17 DIAGNOSIS — C50912 Malignant neoplasm of unspecified site of left female breast: Secondary | ICD-10-CM | POA: Diagnosis not present

## 2021-03-17 DIAGNOSIS — N184 Chronic kidney disease, stage 4 (severe): Secondary | ICD-10-CM | POA: Diagnosis not present

## 2021-03-17 LAB — COMPREHENSIVE METABOLIC PANEL
ALT: 18 U/L (ref 0–44)
AST: 26 U/L (ref 15–41)
Albumin: 4.6 g/dL (ref 3.5–5.0)
Alkaline Phosphatase: 68 U/L (ref 38–126)
Anion gap: 10 (ref 5–15)
BUN: 47 mg/dL — ABNORMAL HIGH (ref 8–23)
CO2: 26 mmol/L (ref 22–32)
Calcium: 9.7 mg/dL (ref 8.9–10.3)
Chloride: 104 mmol/L (ref 98–111)
Creatinine, Ser: 1.61 mg/dL — ABNORMAL HIGH (ref 0.44–1.00)
GFR, Estimated: 30 mL/min — ABNORMAL LOW (ref >60)
Glucose, Bld: 114 mg/dL — ABNORMAL HIGH (ref 70–99)
Potassium: 4.4 mmol/L (ref 3.5–5.1)
Sodium: 140 mmol/L (ref 135–145)
Total Bilirubin: 0.6 mg/dL (ref 0.3–1.2)
Total Protein: 7.8 g/dL (ref 6.5–8.1)

## 2021-03-17 LAB — CBC WITH DIFFERENTIAL/PLATELET
Abs Immature Granulocytes: 0.03 10*3/uL (ref 0.00–0.07)
Basophils Absolute: 0.1 10*3/uL (ref 0.0–0.1)
Basophils Relative: 1 %
Eosinophils Absolute: 0.3 10*3/uL (ref 0.0–0.5)
Eosinophils Relative: 4 %
HCT: 38.6 % (ref 36.0–46.0)
Hemoglobin: 12.9 g/dL (ref 12.0–15.0)
Immature Granulocytes: 0 %
Lymphocytes Relative: 28 %
Lymphs Abs: 2 10*3/uL (ref 0.7–4.0)
MCH: 31.5 pg (ref 26.0–34.0)
MCHC: 33.4 g/dL (ref 30.0–36.0)
MCV: 94.4 fL (ref 80.0–100.0)
Monocytes Absolute: 0.8 10*3/uL (ref 0.1–1.0)
Monocytes Relative: 11 %
Neutro Abs: 4 10*3/uL (ref 1.7–7.7)
Neutrophils Relative %: 56 %
Platelets: 157 10*3/uL (ref 150–400)
RBC: 4.09 MIL/uL (ref 3.87–5.11)
RDW: 13.2 % (ref 11.5–15.5)
WBC: 7.2 10*3/uL (ref 4.0–10.5)
nRBC: 0 % (ref 0.0–0.2)

## 2021-03-17 NOTE — Progress Notes (Signed)
Patient here for oncology follow-up appointment, concerns of fatigue and occasional SOB

## 2021-03-18 LAB — CEA: CEA: 11 ng/mL — ABNORMAL HIGH (ref 0.0–4.7)

## 2021-03-18 LAB — CANCER ANTIGEN 27.29: CA 27.29: 69.7 U/mL — ABNORMAL HIGH (ref 0.0–38.6)

## 2021-03-18 LAB — CANCER ANTIGEN 15-3: CA 15-3: 37.7 U/mL — ABNORMAL HIGH (ref 0.0–25.0)

## 2021-03-19 ENCOUNTER — Other Ambulatory Visit: Payer: Medicare PPO

## 2021-03-19 ENCOUNTER — Ambulatory Visit: Payer: Medicare PPO | Admitting: Internal Medicine

## 2021-03-26 ENCOUNTER — Ambulatory Visit: Payer: Medicare PPO | Admitting: Podiatry

## 2021-03-31 ENCOUNTER — Ambulatory Visit: Payer: Medicare PPO | Admitting: Podiatry

## 2021-03-31 ENCOUNTER — Other Ambulatory Visit: Payer: Self-pay

## 2021-03-31 DIAGNOSIS — D2371 Other benign neoplasm of skin of right lower limb, including hip: Secondary | ICD-10-CM

## 2021-03-31 DIAGNOSIS — D689 Coagulation defect, unspecified: Secondary | ICD-10-CM

## 2021-03-31 DIAGNOSIS — M79675 Pain in left toe(s): Secondary | ICD-10-CM | POA: Diagnosis not present

## 2021-03-31 DIAGNOSIS — M79674 Pain in right toe(s): Secondary | ICD-10-CM

## 2021-03-31 DIAGNOSIS — B351 Tinea unguium: Secondary | ICD-10-CM | POA: Diagnosis not present

## 2021-03-31 DIAGNOSIS — D2372 Other benign neoplasm of skin of left lower limb, including hip: Secondary | ICD-10-CM

## 2021-03-31 NOTE — Progress Notes (Signed)
She presents today chief concern of her painful toes particularly the nails and calluses bilaterally.  Objective: Vital signs are stable she is alert and oriented x3 pulses are palpable.  Multiple benign reactive keratotic lesions plantar aspect of the forefoot bilaterally along with long clinically mycotic nails.  She also has osteoarthritis forefoot and toes.  Assessment: Pain in limb secondary to onychomycosis and osteoarthritis as well as benign skin lesions forefoot right greater than left.  Plan: Debridement of benign skin lesions debridement of nails 1 through 5 bilateral.

## 2021-05-13 ENCOUNTER — Other Ambulatory Visit: Payer: Self-pay | Admitting: *Deleted

## 2021-05-13 MED ORDER — ANASTROZOLE 1 MG PO TABS: 1.0000 mg | ORAL_TABLET | Freq: Every day | ORAL | 3 refills | Status: DC

## 2021-05-14 ENCOUNTER — Other Ambulatory Visit: Payer: Self-pay | Admitting: *Deleted

## 2021-05-14 MED ORDER — ANASTROZOLE 1 MG PO TABS: 1.0000 mg | ORAL_TABLET | Freq: Every day | ORAL | 3 refills | Status: DC

## 2021-06-16 ENCOUNTER — Inpatient Hospital Stay: Payer: Medicare PPO

## 2021-06-16 ENCOUNTER — Inpatient Hospital Stay: Payer: Medicare PPO | Attending: Internal Medicine

## 2021-06-16 ENCOUNTER — Encounter: Payer: Self-pay | Admitting: Internal Medicine

## 2021-06-16 ENCOUNTER — Inpatient Hospital Stay: Payer: Medicare PPO | Admitting: Internal Medicine

## 2021-06-16 ENCOUNTER — Other Ambulatory Visit: Payer: Self-pay

## 2021-06-16 DIAGNOSIS — C50211 Malignant neoplasm of upper-inner quadrant of right female breast: Secondary | ICD-10-CM | POA: Diagnosis present

## 2021-06-16 DIAGNOSIS — Z170 Estrogen receptor positive status [ER+]: Secondary | ICD-10-CM | POA: Diagnosis not present

## 2021-06-16 DIAGNOSIS — C50912 Malignant neoplasm of unspecified site of left female breast: Secondary | ICD-10-CM | POA: Diagnosis not present

## 2021-06-16 DIAGNOSIS — M810 Age-related osteoporosis without current pathological fracture: Secondary | ICD-10-CM

## 2021-06-16 LAB — CBC
HCT: 37.2 % (ref 36.0–46.0)
Hemoglobin: 12.8 g/dL (ref 12.0–15.0)
MCH: 32.6 pg (ref 26.0–34.0)
MCHC: 34.4 g/dL (ref 30.0–36.0)
MCV: 94.7 fL (ref 80.0–100.0)
Platelets: 171 10*3/uL (ref 150–400)
RBC: 3.93 MIL/uL (ref 3.87–5.11)
RDW: 13.3 % (ref 11.5–15.5)
WBC: 7.2 10*3/uL (ref 4.0–10.5)
nRBC: 0 % (ref 0.0–0.2)

## 2021-06-16 LAB — COMPREHENSIVE METABOLIC PANEL
ALT: 15 U/L (ref 0–44)
AST: 25 U/L (ref 15–41)
Albumin: 4.4 g/dL (ref 3.5–5.0)
Alkaline Phosphatase: 69 U/L (ref 38–126)
Anion gap: 11 (ref 5–15)
BUN: 54 mg/dL — ABNORMAL HIGH (ref 8–23)
CO2: 26 mmol/L (ref 22–32)
Calcium: 9.9 mg/dL (ref 8.9–10.3)
Chloride: 101 mmol/L (ref 98–111)
Creatinine, Ser: 1.64 mg/dL — ABNORMAL HIGH (ref 0.44–1.00)
GFR, Estimated: 29 mL/min — ABNORMAL LOW (ref >60)
Glucose, Bld: 166 mg/dL — ABNORMAL HIGH (ref 70–99)
Potassium: 4 mmol/L (ref 3.5–5.1)
Sodium: 138 mmol/L (ref 135–145)
Total Bilirubin: 0.2 mg/dL — ABNORMAL LOW (ref 0.3–1.2)
Total Protein: 7.5 g/dL (ref 6.5–8.1)

## 2021-06-16 MED ORDER — DENOSUMAB 60 MG/ML ~~LOC~~ SOSY
60.0000 mg | PREFILLED_SYRINGE | Freq: Once | SUBCUTANEOUS | Status: AC
  Administered 2021-06-16: 60 mg via SUBCUTANEOUS
  Filled 2021-06-16: qty 1

## 2021-06-16 NOTE — Assessment & Plan Note (Addendum)
#  Synchronous bilateral breast cancer-status post bilateral mastectomies- Right Breast-stage III ER/PR positive HER-2 NEG; & Left breast cancer stage II ER/PR Pos; Her 2 NEG. Currently on anastrozole. April 2022- Bone scan-NEG for cancer- STABLE; but rising tumor makers. Given pts age- monitor for now; will discuss with the patient's daughter [name].  Would consider imaging after speaking with the daughter.   # Continue anastrozole.-Tolerating fairly well-no obvious evidence of recurrence noted- tolerating well.   # Mild-moderate arthritic pain-osteoarthritis [bil knee L >R] G-1-2; STABLE;  continue tylenol prn.  .   # vit D def [on ergocalciferol]--STABLE;   # CKD- Stage- IV- GFR 29 [Dr.Kolluru]; -stable  # NOV 2021- OsteoporosisT-scoreof -3.7.on ca+vit D. Prolia q 6 months [July, 8309]. Again in 2023/next visit.   # DISPOSITION: # Prolia today # follow up in 2 months- MD; labs- cbc/cmp; ca27;29; CEA; ca15-3; Dr.B

## 2021-06-16 NOTE — Progress Notes (Signed)
Patient denies new problems/concerns today.   °

## 2021-06-16 NOTE — Progress Notes (Signed)
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   # Left breast-invasive mammary carcinoma-status post mastectomy pT1cN1a; stage I-ER/PR positive HER-2 negative  #November 23, 2019-start adjuvant radiation bilateral [finished August 31]; no chemo;   #September 22nd 2021- anastrozole ----------------------------------------------------------------------------------------   A. RIGHT BREAST, 3:00; ULTRASOUND-GUIDED BIOPSY:  - INVASIVE MAMMARY CARCINOMA WITH MICROPAPILLARY FEATURES; Size of invasive carcinoma: 55m in this sample  Histologic grade of invasive carcinoma: Grade 2 ; ER/PR > 905: her 2- NEG  A. RIGHT BREAST, 3:00; ULTRASOUND-GUIDED BIOPSY: - INVASIVE MAMMARY CARCINOMA WITH MICROPAPILLARY FEATURES. 113min this sample. Grade 2. Lymphovascular invasion: Present.   B. LYMPH NODE, RIGHT AXILLARY; ULTRASOUND-GUIDED NEEDLE CORE BIOPSY: - METASTATIC MAMMARY CARCINOMA, 6 MM IN THIS SAMPLE.   C. RIGHT BREAST, LATERAL CALCIFICATIONS; STEREOTACTIC BIOPSY: - DUCTAL CARCINOMA IN SITU, INTERMEDIATE GRADE, WITH CALCIFICATIONS. -------------------------------------------------------------   # CKD- Stage III-IV [Tina Curtis]  # SURVIVORSHIP:   # GENETICS:   Curtis:  Right breast cancer stage III;  left breast cancer-stage I GOALS: Cure  CURRENT/MOST RECENT THERAPY : Anastrozole    Carcinoma of  upper-inner quadrant of right breast in female, estrogen receptor positive (Tina Curtis   Carcinoma of upper-inner quadrant of right breast in female, estrogen receptor positive (Tina Curtis     HISTORY OF PRESENTING ILLNESS: Ambulating walking with a rolling walker ; alone.   Tina HABLE89.o.  female synchronous bilateral breast cancer [right stage III ER/PR positive;Her2 NEG; left stage I ER/PR positive; Her2- NEG] s/p adjuvant radiation; currently on Anastrazole is here for follow-up.  [place]  No further falls.  Continues to have ongoing fatigue.  Feels dizzy in the morning-as per nephrology blood pressure medications discontinue/change.  Continues to complain of mild arthritic pain.  Not any worse.  Otherwise no chest pain or shortness of breath or cough.  No headaches.  Review of Systems  Constitutional:  Positive for malaise/fatigue. Negative for chills, diaphoresis, fever and weight loss.  HENT:  Negative for nosebleeds and sore throat.   Eyes:  Negative for double vision.  Respiratory:  Negative for cough, hemoptysis, sputum production, shortness of breath and wheezing.   Cardiovascular:  Negative for chest pain, palpitations, orthopnea and leg swelling.  Gastrointestinal:  Negative for abdominal pain, blood in stool, constipation, diarrhea, heartburn, melena, nausea and vomiting.  Genitourinary:  Negative for dysuria, frequency and urgency.  Musculoskeletal:  Positive for back pain and joint pain.  Neurological:  Negative for dizziness, tingling, focal weakness, weakness and headaches.  Endo/Heme/Allergies:  Does not bruise/bleed easily.  Psychiatric/Behavioral:  Negative for depression. The patient is not nervous/anxious and does not have insomnia.     MEDICAL HISTORY:  Past Medical History:  Curtis Date   Arthritis    Gout   Chronic kidney disease    Diarrhea    Hypercholesteremia     Hypertension    Neuropathy    feet and lower legs   Seasonal allergies    Skin  cancer    face    SURGICAL HISTORY: Past Surgical History:  Procedure Laterality Date   ABDOMINAL HYSTERECTOMY  2000   APPENDECTOMY     BREAST BIOPSY Right 09/19/2019   affirm bx of calcs UOQ, x marker, path pending   BREAST BIOPSY Right 09/19/2019   [place] bx of mass,heart marker, path pending   BREAST BIOPSY Right 09/19/2019   [place] bx of LN, coil marker, path pending   CATARACT EXTRACTION Left    CATARACT EXTRACTION W/PHACO Right 10/24/2014   Procedure: CATARACT EXTRACTION PHACO AND INTRAOCULAR LENS PLACEMENT ([place]);  Surgeon: Tina Koyanagi, MD;  Location: [facility];  Service: Ophthalmology;  Laterality: Right;   ESOPHAGOGASTRODUODENOSCOPY N/A 03/07/2018   Procedure: ESOPHAGOGASTRODUODENOSCOPY (EGD);  Surgeon: Tina Landsman, MD;  Location: [facility] ENDOSCOPY;  Service: Gastroenterology;  Laterality: N/A;   MASTECTOMY MODIFIED RADICAL Right 10/13/2019   Procedure: MASTECTOMY MODIFIED RADICAL;  Surgeon: Tina Bellow, MD;  Location: [facility]S;  Service: General;  Laterality: Right;   RENAL ANGIOGRAPHY Right 04/11/2018   Procedure: RENAL ANGIOGRAPHY;  Surgeon: Tina Huxley, MD;  Location: [facility];  Service: Cardiovascular;  Laterality: Right;   SIMPLE MASTECTOMY WITH AXILLARY SENTINEL NODE BIOPSY Left 10/13/2019   Procedure: SIMPLE MASTECTOMY TRUE CUT BIOPSY, SENTINEL NODE BIOPSY;  Surgeon: Tina Bellow, MD;  Location: [facility];  Service: General;  Laterality: Left;    SOCIAL HISTORY: Social History   Socioeconomic History   Marital status: Widowed    Spouse name: Not on file   Number of children: Not on file   Years of education: Not on file   Highest education level: Not on file  Occupational History   Not on file  Tobacco Use   Smoking status: Never   Smokeless tobacco: Never  Substance and Sexual Activity   Alcohol use: No   Drug use: Never   Sexual activity:  Not on file  Other Topics Concern   Not on file  Social History Narrative   2 daughters; lives in [place]; Pharmacist, 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    Social Determinants of Health   Financial Resource Strain: Not on file  Food Insecurity: Not on file  Transportation Needs: Not on file  Physical Activity: Not on file  Stress: Not on file  Social Connections: Not on file  Intimate Partner Violence: Not on file    FAMILY HISTORY: Family History  Problem Relation Age of Onset   Hypertension Mother    Hypertension Father    Stroke Father    Breast cancer Daughter 60    ALLERGIES:  is allergic to penicillins and drug ingredient [zinc].  MEDICATIONS:  Current Outpatient Medications  Medication Sig Dispense Refill   allopurinol (ZYLOPRIM) 100 MG tablet      anastrozole (ARIMIDEX) 1 MG tablet Take 1 tablet (1 mg total) by mouth daily. DO NOT START if RASH is NOT better 90 tablet 3   aspirin 81 MG EC tablet Take by mouth.     citalopram (CELEXA) 10 MG tablet Take 10 mg by mouth daily.      clopidogrel (PLAVIX) 75 MG tablet TAKE 1 TABLET BY MOUTH EVERY DAY 30 tablet 1   docusate sodium (COLACE) 100 MG capsule Take 100 mg by mouth daily as needed for mild constipation.     DULoxetine (CYMBALTA) 60 MG capsule Take 60 mg by mouth daily.      ergocalciferol (VITAMIN D2) 1.25 MG (50000 UT) capsule Take 1 capsule  by mouth once a week.     furosemide (LASIX) 20 MG tablet Take 20 mg by mouth daily.      gabapentin (NEURONTIN) 300 MG capsule Take 300 mg by mouth at bedtime.     loratadine (CLARITIN) 10 MG tablet Take 10 mg by mouth daily as needed for allergies. AM     metoprolol succinate (TOPROL-XL) 25 MG 24 hr tablet Take 37.5 mg by mouth at bedtime.      simvastatin (ZOCOR) 20 MG tablet Take 20 mg by mouth every evening. PM     amLODipine (NORVASC) 5 MG tablet Take 2.5 mg by mouth daily.  (Patient not taking: Reported on 06/16/2021)  1   No current  facility-administered medications for this visit.      Marland Kitchen  PHYSICAL EXAMINATION: ECOG PERFORMANCE STATUS: 0 - Asymptomatic  Vitals:   06/16/21 1500  BP: 125/74  Pulse: 72  Resp: 16  Temp: (!) 97.5 F (36.4 C)   Filed Weights   06/16/21 1500  Weight: 145 lb 12.8 oz (66.1 kg)    Physical Exam Constitutional:      Comments: Walks with a rolling walker.  Alone.  HENT:     Head: Normocephalic and atraumatic.     Mouth/Throat:     Pharynx: No oropharyngeal exudate.  Eyes:     Pupils: Pupils are equal, round, and reactive to light.  Cardiovascular:     Rate and Rhythm: Normal rate and regular rhythm.  Pulmonary:     Effort: Pulmonary effort is normal. No respiratory distress.     Breath sounds: Normal breath sounds. No wheezing.  Abdominal:     General: Bowel sounds are normal. There is no distension.     Palpations: Abdomen is soft. There is no mass.     Tenderness: There is no abdominal tenderness. There is no guarding or rebound.  Musculoskeletal:        General: No tenderness. Normal range of motion.     Cervical back: Normal range of motion and neck supple.  Skin:    General: Skin is warm.     Comments: Bruising bruising noted on the chest and left arm.  Neurological:     Mental Status: She is alert and oriented to person, place, and time.  Psychiatric:        Mood and Affect: Affect normal.    Results for Tina Curtis, Tina Curtis (MRN [id number]) as of 01/15/2021 15:37  Ref. Range 08/27/2020 09:55 09/02/2020 12:32 10/22/2020 14:01 12/04/2020 14:35 01/15/2021 14:50  CA 15-3 Latest Ref Range: 0.0 - 25.0 U/mL   32.0 (H) 27.9 (H)   CA 27.29 Latest Ref Range: 0.0 - 38.6 U/mL   38.0 27.8   CEA Latest Ref Range: 0.0 - 4.7 ng/mL   4.1 3.6    LABORATORY DATA:  I have reviewed the data as listed Lab Results  Component Value Date   WBC 7.2 06/16/2021   HGB 12.8 06/16/2021   HCT 37.2 06/16/2021   MCV 94.7 06/16/2021   PLT 171 06/16/2021   Recent Labs    01/15/21 1450  03/17/21 1514 06/16/21 1457  NA 137 140 138  K 4.2 4.4 4.0  CL 104 104 101  CO2 _0 GLUCOSE 147* 114* 166*  BUN 40* 47* 54*  CREATININE 1.70* 1.61* 1.64*  CALCIUM 9.5 9.7 9.9  GFRNONAA 28* 30* 29*  PROT 8.0 7.8 7.5  ALBUMIN 4.5 4.6 4.4  AST _1 ALT 15 18 15  ALKPHOS 89 68 69  BILITOT 0.5 0.6 0.2*          RADIOGRAPHIC STUDIES: I have personally reviewed the radiological images as listed and agreed with the findings in the report. No results found.  ASSESSMENT & PLAN:   Carcinoma of upper-inner quadrant of right breast in female, estrogen receptor positive ([place])  #Synchronous bilateral breast cancer-status post bilateral mastectomies- Right Breast-stage III ER/PR positive HER-2 NEG; & Left breast cancer stage II ER/PR Pos; Her 2 NEG. Currently on anastrozole. April 2022- Bone scan-NEG for cancer- STABLE; but rising tumor makers. Given pts age- monitor for now; will discuss with the patient's daughter [name].  Would consider imaging after speaking with the daughter.   # Continue anastrozole.-Tolerating fairly well-no obvious evidence of recurrence noted- tolerating well.   # Mild-moderate arthritic pain-osteoarthritis [bil knee L >R] G-1-2; STABLE;  continue tylenol prn.  .   # vit D def [on ergocalciferol]--STABLE;   # CKD- Stage- IV- GFR 29 [Tina Curtis]; -stable  # NOV 2021- OsteoporosisT-scoreof -3.7.on ca+vit D. Prolia q 6 months [July, 2025]. Again in 2023/next visit.   # DISPOSITION: # Prolia today # follow up in 2 months- MD; labs- cbc/cmp; ca27;29; CEA; ca15-3; TinaB   All questions were answered. The patient/family knows to call the clinic with any problems, questions or concerns.     Tina Sickle, MD 06/16/2021 3:58 PM

## 2021-06-17 LAB — CEA: CEA: 3.7 ng/mL (ref 0.0–4.7)

## 2021-06-17 LAB — CANCER ANTIGEN 15-3: CA 15-3: 32 U/mL — ABNORMAL HIGH (ref 0.0–25.0)

## 2021-06-17 LAB — CANCER ANTIGEN 27.29: CA 27.29: 32.7 U/mL (ref 0.0–38.6)

## 2021-06-17 NOTE — Progress Notes (Signed)
I spoke to patients daughter, regarding better than expected results of the repeated tumor marker. However, its still recommend a pet scan for further evaluation. The daughter is in agreement.  I tried to reach the patient. I had to leave a voicemail.  Please schedule a pet scan in the first week of  April and please move the patients appointment  up with me the week of April 6- 10th. The patients daughter will plan to b e in town/next appointment.  Reach the patients daughter, [name]- is having difficulty reaching the patient.

## 2021-06-18 ENCOUNTER — Other Ambulatory Visit: Payer: Self-pay | Admitting: Internal Medicine

## 2021-06-18 ENCOUNTER — Encounter: Payer: Self-pay | Admitting: Internal Medicine

## 2021-06-18 ENCOUNTER — Telehealth: Payer: Self-pay

## 2021-06-18 DIAGNOSIS — C50211 Malignant neoplasm of upper-inner quadrant of right female breast: Secondary | ICD-10-CM

## 2021-06-18 DIAGNOSIS — Z170 Estrogen receptor positive status [ER+]: Secondary | ICD-10-CM

## 2021-06-18 NOTE — Telephone Encounter (Signed)
Please schedule as MD recommends below and inform patient's daughter of appt details.  06/16/21 progress note from MD:  spoke to patients daughter, regarding better than expected results of the repeated tumor marker. However, its still recommend a pet scan for further evaluation. The daughter is in agreement.   I tried to reach the patient. I had to leave a voicemail.   Please schedule a pet scan in the first week of  April and please move the patients appointment  up with me the week of April 6- 10th. The patients daughter will plan to be in town/next appointment.   Reach the patients daughter, Tina Curtis- is having difficulty reaching the patient.

## 2021-06-18 NOTE — Progress Notes (Signed)
I spoke with patient regarding her concerns for pet scan not being done until April 1 week. Unfortunately pet scanner is down at [facility] until April. Patient sites difficulty with transportation to go to [place].  Patient will continue follow up as planned/PET scan scheduled.

## 2021-06-18 NOTE — Progress Notes (Signed)
Please schedule the PET scan first week of April.  C- And reschedule the patient's follow-up with me to April 10th.   Please see my-earlier message/on this patient.

## 2021-06-18 NOTE — Telephone Encounter (Signed)
06/18/2021 Spoke w/ pts daughter Zondra Lawlor, informed her of PET scheduled for 08/11/21 @ 12:30. Also let her know that appts w/ Dr. B have been moved up to 08/18/21 @ 1:45. Daughter said these dates and times should work, and she will inform her mother. Will also put appt reminders in the mail to the pt and attempt to reach her via phone  SRW

## 2021-06-18 NOTE — Progress Notes (Signed)
Pt scheduled in previous phone note from today (please see for documentation)

## 2021-06-19 ENCOUNTER — Telehealth: Payer: Self-pay | Admitting: *Deleted

## 2021-06-19 NOTE — Telephone Encounter (Signed)
Please see Dr. Jacinto Reap Epic On Hand Encounter note (progress note) from yesterday.  He spoke to patient yesterday.

## 2021-06-19 NOTE — Telephone Encounter (Signed)
Patient called asking if her PET appointment being in April is ok or should it be moved up. Please advise

## 2021-06-20 ENCOUNTER — Encounter: Payer: Self-pay | Admitting: *Deleted

## 2021-06-20 NOTE — Telephone Encounter (Signed)
Call returned to patient and left voice mail message that per discussion 2/8 with physician, ok to wait until April for PET scan

## 2021-07-02 ENCOUNTER — Other Ambulatory Visit: Payer: Self-pay

## 2021-07-02 ENCOUNTER — Ambulatory Visit: Payer: Medicare PPO | Admitting: Podiatry

## 2021-07-02 ENCOUNTER — Encounter: Payer: Self-pay | Admitting: Podiatry

## 2021-07-02 DIAGNOSIS — D2372 Other benign neoplasm of skin of left lower limb, including hip: Secondary | ICD-10-CM

## 2021-07-02 DIAGNOSIS — B351 Tinea unguium: Secondary | ICD-10-CM

## 2021-07-02 DIAGNOSIS — M79675 Pain in left toe(s): Secondary | ICD-10-CM

## 2021-07-02 DIAGNOSIS — D2371 Other benign neoplasm of skin of right lower limb, including hip: Secondary | ICD-10-CM

## 2021-07-02 DIAGNOSIS — M79674 Pain in right toe(s): Secondary | ICD-10-CM

## 2021-07-02 DIAGNOSIS — D689 Coagulation defect, unspecified: Secondary | ICD-10-CM

## 2021-07-02 NOTE — Progress Notes (Signed)
She presents today chief complaint of painful benign skin lesion and painful elongated toenails bilateral.  Objective: Pulses remain palpable severe hammertoe deformities and varus and valgus deformities.  She has a benign skin lesion plantar aspect forefoot right x2 and then toenails are long thick yellow dystrophic onychomycotic.  Assessment: Pain in limb secondary to benign skin lesion x2 right foot and pain in limb secondary to onychomycosis.  Plan: I debrided benign skin lesions x2 right foot and debridement of toenails 1 through 5 bilateral.

## 2021-08-05 ENCOUNTER — Telehealth: Payer: Self-pay | Admitting: *Deleted

## 2021-08-05 ENCOUNTER — Telehealth: Payer: Self-pay

## 2021-08-05 NOTE — Telephone Encounter (Signed)
Duplicate

## 2021-08-05 NOTE — Telephone Encounter (Signed)
PET scan capability will not be available on 08/11/21 as scheduled.  PET has been rescheduled at the [facility] location for 4/14 as well as MD f/u rescheduled to 4/21. ? ?Patient's daughter notified of the change and AVS will be mailed.  ? ?

## 2021-08-06 NOTE — Telephone Encounter (Signed)
Message receuved from triage that patient requesting a call regarding the appointment that was discussed with her daughter  on 08/06/21. ? ?Attempted to call patient to inform her of the need to have the PET at [facility] location but no answer, message left for her to return call if she needed additional information. ?

## 2021-08-11 ENCOUNTER — Other Ambulatory Visit: Payer: Medicare PPO

## 2021-08-18 ENCOUNTER — Ambulatory Visit: Payer: Medicare PPO | Admitting: Internal Medicine

## 2021-08-18 ENCOUNTER — Other Ambulatory Visit: Payer: Medicare PPO

## 2021-08-20 ENCOUNTER — Other Ambulatory Visit: Payer: Medicare PPO

## 2021-08-20 ENCOUNTER — Ambulatory Visit: Payer: Medicare PPO | Admitting: Internal Medicine

## 2021-08-22 ENCOUNTER — Ambulatory Visit (HOSPITAL_COMMUNITY)
Admission: RE | Admit: 2021-08-22 | Discharge: 2021-08-22 | Disposition: A | Discharge status: Home / Self Care | Payer: Medicare PPO | Source: Ambulatory Visit | Attending: Internal Medicine | Admitting: Internal Medicine

## 2021-08-22 DIAGNOSIS — C50211 Malignant neoplasm of upper-inner quadrant of right female breast: Secondary | ICD-10-CM

## 2021-08-22 DIAGNOSIS — Z170 Estrogen receptor positive status [ER+]: Secondary | ICD-10-CM | POA: Diagnosis present

## 2021-08-22 LAB — GLUCOSE, CAPILLARY: Glucose-Capillary: 170 mg/dL — ABNORMAL HIGH (ref 70–99)

## 2021-08-22 MED ORDER — FLUDEOXYGLUCOSE F - 18 (FDG) INJECTION
7.0000 | Freq: Once | INTRAVENOUS | Status: AC
  Administered 2021-08-22: 7 via INTRAVENOUS

## 2021-08-25 ENCOUNTER — Other Ambulatory Visit (HOSPITAL_COMMUNITY): Payer: Medicare PPO

## 2021-08-29 ENCOUNTER — Inpatient Hospital Stay: Payer: Medicare PPO | Attending: Internal Medicine | Admitting: Internal Medicine

## 2021-08-29 ENCOUNTER — Inpatient Hospital Stay: Payer: Medicare PPO

## 2021-08-29 VITALS — BP 145/79 | HR 69 | Temp 98.7°F | Resp 16 | Wt 148.0 lb

## 2021-08-29 DIAGNOSIS — Z853 Personal history of malignant neoplasm of breast: Secondary | ICD-10-CM | POA: Insufficient documentation

## 2021-08-29 DIAGNOSIS — K769 Liver disease, unspecified: Secondary | ICD-10-CM | POA: Insufficient documentation

## 2021-08-29 DIAGNOSIS — Z170 Estrogen receptor positive status [ER+]: Secondary | ICD-10-CM | POA: Diagnosis not present

## 2021-08-29 DIAGNOSIS — Z90710 Acquired absence of both cervix and uterus: Secondary | ICD-10-CM | POA: Diagnosis not present

## 2021-08-29 DIAGNOSIS — N184 Chronic kidney disease, stage 4 (severe): Secondary | ICD-10-CM | POA: Insufficient documentation

## 2021-08-29 DIAGNOSIS — Z803 Family history of malignant neoplasm of breast: Secondary | ICD-10-CM | POA: Insufficient documentation

## 2021-08-29 DIAGNOSIS — C50211 Malignant neoplasm of upper-inner quadrant of right female breast: Secondary | ICD-10-CM

## 2021-08-29 DIAGNOSIS — I129 Hypertensive chronic kidney disease with stage 1 through stage 4 chronic kidney disease, or unspecified chronic kidney disease: Secondary | ICD-10-CM | POA: Diagnosis not present

## 2021-08-29 DIAGNOSIS — R519 Headache, unspecified: Secondary | ICD-10-CM

## 2021-08-29 LAB — CBC WITH DIFFERENTIAL/PLATELET
Abs Immature Granulocytes: 0.02 10*3/uL (ref 0.00–0.07)
Basophils Absolute: 0.1 10*3/uL (ref 0.0–0.1)
Basophils Relative: 1 %
Eosinophils Absolute: 0.3 10*3/uL (ref 0.0–0.5)
Eosinophils Relative: 4 %
HCT: 40.4 % (ref 36.0–46.0)
Hemoglobin: 13.5 g/dL (ref 12.0–15.0)
Immature Granulocytes: 0 %
Lymphocytes Relative: 26 %
Lymphs Abs: 1.7 10*3/uL (ref 0.7–4.0)
MCH: 31.7 pg (ref 26.0–34.0)
MCHC: 33.4 g/dL (ref 30.0–36.0)
MCV: 94.8 fL (ref 80.0–100.0)
Monocytes Absolute: 0.6 10*3/uL (ref 0.1–1.0)
Monocytes Relative: 9 %
Neutro Abs: 3.8 10*3/uL (ref 1.7–7.7)
Neutrophils Relative %: 60 %
Platelets: 164 10*3/uL (ref 150–400)
RBC: 4.26 MIL/uL (ref 3.87–5.11)
RDW: 12.6 % (ref 11.5–15.5)
WBC: 6.4 10*3/uL (ref 4.0–10.5)
nRBC: 0 % (ref 0.0–0.2)

## 2021-08-29 LAB — COMPREHENSIVE METABOLIC PANEL
ALT: 17 U/L (ref 0–44)
AST: 25 U/L (ref 15–41)
Albumin: 4.4 g/dL (ref 3.5–5.0)
Alkaline Phosphatase: 72 U/L (ref 38–126)
Anion gap: 10 (ref 5–15)
BUN: 42 mg/dL — ABNORMAL HIGH (ref 8–23)
CO2: 29 mmol/L (ref 22–32)
Calcium: 9.8 mg/dL (ref 8.9–10.3)
Chloride: 99 mmol/L (ref 98–111)
Creatinine, Ser: 1.71 mg/dL — ABNORMAL HIGH (ref 0.44–1.00)
GFR, Estimated: 28 mL/min — ABNORMAL LOW (ref >60)
Glucose, Bld: 258 mg/dL — ABNORMAL HIGH (ref 70–99)
Potassium: 3.8 mmol/L (ref 3.5–5.1)
Sodium: 138 mmol/L (ref 135–145)
Total Bilirubin: 0.4 mg/dL (ref 0.3–1.2)
Total Protein: 7.6 g/dL (ref 6.5–8.1)

## 2021-08-29 NOTE — Progress Notes (Signed)
one Whitmer ?CONSULT NOTE ? ?Patient Care Team: ?Derinda Late, MD as PCP - General (Family Medicine) ?Cammie Sickle, MD as Consulting Physician (Internal Medicine) ?Robert Bellow, MD as Consulting Physician (General Surgery) ?Theodore Demark, RN as Oncology Nurse Navigator ?Noreene Filbert, MD as Referring Physician (Radiation Oncology) ? ?CHIEF COMPLAINTS/PURPOSE OF CONSULTATION: Breast cancer ? 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 Burnett] stage III T2N3 [19/24 LN]; ER/PR positive HER-2 negative.  ? ?# Left breast-invasive mammary carcinoma-status post mastectomy pT1cN1a; stage I-ER/PR positive HER-2 negative ? ?#November 23, 2019-start adjuvant radiation bilateral [finished August 31]; no chemo;  ? ?#September 22nd 2021- anastrozole ?----------------------------------------------------------------------------------------  ? ?A. RIGHT BREAST, 3:00; ULTRASOUND-GUIDED BIOPSY:  ?- INVASIVE MAMMARY CARCINOMA WITH MICROPAPILLARY FEATURES; Size of invasive carcinoma: 77m in this sample  ?Histologic grade of invasive carcinoma: Grade 2 ; ER/PR > 905: her 2- NEG ? ?A. RIGHT BREAST, 3:00; ULTRASOUND-GUIDED BIOPSY: - INVASIVE MAMMARY ?CARCINOMA WITH MICROPAPILLARY FEATURES. 175min this sample. Grade ?2. Lymphovascular invasion: Present. ?  ?B. LYMPH NODE, RIGHT AXILLARY; ULTRASOUND-GUIDED NEEDLE CORE BIOPSY: ?- METASTATIC MAMMARY CARCINOMA, 6 MM IN THIS SAMPLE. ?  ?C. RIGHT BREAST, LATERAL CALCIFICATIONS; STEREOTACTIC BIOPSY: - ?DUCTAL CARCINOMA IN SITU, INTERMEDIATE GRADE, WITH CALCIFICATIONS. ?-------------------------------------------------------------  ? ?# CKD- Stage III-IV [Dr.Kolluru] ? ?# SURVIVORSHIP:  ? ?# GENETICS:  ? ?DIAGNOSIS:  ?Right breast cancer stage III;  ?left breast cancer-stage I ?GOALS: Cure ? ?CURRENT/MOST RECENT THERAPY : Anastrozole ? ?  ?Carcinoma of  upper-inner quadrant of right breast in female, estrogen receptor positive ([facility] ?09/27/2019 Initial Diagnosis  ? Carcinoma of upper-inner quadrant of right breast in female, estrogen receptor positive ([facility]?  ? ? ? ?HISTORY OF PRESENTING ILLNESS: Ambulating walking with a rolling walker ; alone.  Daughters over the phone. ? ?Tina [place]89.o.  female synchronous bilateral breast cancer [right stage III ER/PR positive;Her2 NEG; left stage I ER/PR positive; Her2- NEG] s/p adjuvant radiation; currently on Anastrazole is here for follow-up/review results of the PET scan ? ?Patient complains of intermittent headaches.  Mostly in the last 2 weeks.  No nausea no vomiting.  However interrupting her vision.  Patient is concerned about fall. ? ?Patient continues to have ongoing fatigue.  Otherwise denies any chest pain or shortness of the cough. ? ?Review of Systems  ?Constitutional:  Positive for malaise/fatigue. Negative for chills, diaphoresis, fever and weight loss.  ?HENT:  Negative for nosebleeds and sore throat.   ?Eyes:  Negative for double vision.  ?Respiratory:  Negative for cough, hemoptysis, sputum production, shortness of breath and wheezing.   ?Cardiovascular:  Negative for chest pain, palpitations, orthopnea and leg swelling.  ?Gastrointestinal:  Negative for abdominal pain, blood in stool, constipation, diarrhea, heartburn, melena, nausea and vomiting.  ?Genitourinary:  Negative for dysuria, frequency and urgency.  ?Musculoskeletal:  Positive for back pain and joint pain.  ?Neurological:  Negative for dizziness, tingling, focal weakness, weakness and headaches.  ?Endo/Heme/Allergies:  Does not bruise/bleed easily.  ?Psychiatric/Behavioral:  Negative for depression. The patient is not nervous/anxious and does not have insomnia.    ? ?MEDICAL HISTORY:  ?Past Medical History:  ?Diagnosis Date  ? Arthritis   ? Gout  ? Chronic kidney disease   ? Diarrhea   ? Hypercholesteremia   ? Hypertension   ? Neuropathy    ? feet and lower legs  ? Seasonal allergies   ? Skin cancer   ?  face  ? ? ?SURGICAL HISTORY: ?Past Surgical History:  ?Procedure Laterality Date  ? ABDOMINAL HYSTERECTOMY  2000  ? APPENDECTOMY    ? BREAST BIOPSY Right 09/19/2019  ? affirm bx of calcs UOQ, x marker, path pending  ? BREAST BIOPSY Right 09/19/2019  ? [place] bx of mass,heart marker, path pending  ? BREAST BIOPSY Right 09/19/2019  ? [place] bx of LN, coil marker, path pending  ? CATARACT EXTRACTION Left   ? CATARACT EXTRACTION W/PHACO Right 10/24/2014  ? Procedure: CATARACT EXTRACTION PHACO AND INTRAOCULAR LENS PLACEMENT ([facility]);  Surgeon: Leandrew Koyanagi, MD;  Location: [facility];  Service: Ophthalmology;  Laterality: Right;  ? ESOPHAGOGASTRODUODENOSCOPY N/A 03/07/2018  ? Procedure: ESOPHAGOGASTRODUODENOSCOPY (EGD);  Surgeon: Lin Landsman, MD;  Location: [facility] ENDOSCOPY;  Service: Gastroenterology;  Laterality: N/A;  ? MASTECTOMY MODIFIED RADICAL Right 10/13/2019  ? Procedure: MASTECTOMY MODIFIED RADICAL;  Surgeon: Robert Bellow, MD;  Location: [facility] OR[facility];  Service: General;  Laterality: Right;  ? RENAL ANGIOGRAPHY Right 04/11/2018  ? Procedure: RENAL ANGIOGRAPHY;  Surgeon: Algernon Huxley, MD;  Location: [facility];  Service: Cardiovascular;  Laterality: Right;  ? SIMPLE MASTECTOMY WITH AXILLARY SENTINEL NODE BIOPSY Left 10/13/2019  ? Procedure: SIMPLE MASTECTOMY TRUE CUT BIOPSY, SENTINEL NODE BIOPSY;  Surgeon: Robert Bellow, MD;  Location: [facility];  Service: General;  Laterality: Left;  ? ? ?SOCIAL HISTORY: ?Social History  ? ?Socioeconomic History  ? Marital status: Widowed  ?  Spouse name: Not on file  ? Number of children: Not on file  ? Years of education: Not on file  ? Highest education level: Not on file  ?Occupational History  ? Not on file  ?Tobacco Use  ? Smoking status: Never  ? Smokeless tobacco: Never  ?Substance and Sexual Activity  ? Alcohol use: No  ? Drug use: Never  ? Sexual activity: Not on file  ?Other Topics  Concern  ? Not on file  ?Social History Narrative  ? 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   ? ?Social Determinants of Health  ? ?Financial Resource Strain: Not on file  ?Food Insecurity: Not on file  ?Transportation Needs: Not on file  ?Physical Activity: Not on file  ?Stress: Not on file  ?Social Connections: Not on file  ?Intimate Partner Violence: Not on file  ? ? ?FAMILY HISTORY: ?Family History  ?Problem Relation Age of Onset  ? Hypertension Mother   ? Hypertension Father   ? Stroke Father   ? Breast cancer Daughter 98  ? ? ?ALLERGIES:  is allergic to penicillins and drug ingredient [zinc]. ? ?MEDICATIONS:  ?Current Outpatient Medications  ?Medication Sig Dispense Refill  ? allopurinol (ZYLOPRIM) 100 MG tablet     ? anastrozole (ARIMIDEX) 1 MG tablet Take 1 tablet (1 mg total) by mouth daily. DO NOT START if RASH is NOT better 90 tablet 3  ? aspirin 81 MG EC tablet Take by mouth.    ? calcitRIOL (ROCALTROL) 0.25 MCG capsule Take by mouth.    ? citalopram (CELEXA) 10 MG tablet Take 10 mg by mouth daily.     ? clopidogrel (PLAVIX) 75 MG tablet TAKE 1 TABLET BY MOUTH EVERY DAY 30 tablet 1  ? docusate sodium (COLACE) 100 MG capsule Take 100 mg by mouth daily as needed for mild constipation.    ? DULoxetine (CYMBALTA) 60 MG capsule Take 60 mg by mouth daily.     ? ergocalciferol (VITAMIN D2)  1.25 MG (50000 UT) capsule Take 1 capsule by mouth once a week.    ? furosemide (LASIX) 20 MG tablet Take 20 mg by mouth daily.     ? gabapentin (NEURONTIN) 300 MG capsule Take 300 mg by mouth at bedtime.    ? loratadine (CLARITIN) 10 MG tablet Take 10 mg by mouth daily as needed for allergies. AM    ? metoprolol succinate (TOPROL-XL) 25 MG 24 hr tablet Take 37.5 mg by mouth at bedtime.     ? simvastatin (ZOCOR) 20 MG tablet Take 20 mg by mouth every evening. PM    ? amLODipine (NORVASC) 5 MG tablet Take 2.5 mg by mouth daily.  (Patient not taking: Reported on  06/16/2021)  1  ? ?No current facility-administered medications for this visit.  ? ? ?  ?. ? ?PHYSICAL EXAMINATION: ?ECOG PERFORMANCE STATUS: 0 - Asymptomatic ? ?Vitals:  ? 08/29/21 1000  ?BP: (!) 145/79  ?Pu

## 2021-08-29 NOTE — Assessment & Plan Note (Addendum)
#  Synchronous bilateral breast cancer-status post bilateral mastectomies- Right Breast-stage III ER/PR positive HER-2 NEG; & Left breast cancer stage II ER/PR Pos; Her 2 NEG. Currently on anastrozole. PET scan- April 20th, 2023-  Interval bilateral mastectomy and right axillary node dissection. No evidence of chest wall recurrence or thoracic metastatic disease.  Questionable small hypermetabolic lesion centrally in the right hepatic lobe (not definite; SEE BELOW);  No other evidence of metastatic disease. ? ??#Given the ongoing fatigue recommend HOLD anastrozole until the next visit.  Consider other AI's.  We will discuss again at next visit ? ?#New onset headaches- ? [seen Dr.Shah > 4 years]; no nausea/ vomitting; on Tylenol prn-question metastatic disease; recommend MRI brain with and without contrast. ? ?# ?  Liver lesion noted in the PET scan-discussed regarding the evaluation with MRI with and without contrast.  However, we will prioritize brain MRI at this time.  Discussed with patient and family in agreement. ? ?# Elevated BG- PBF- 258 [Dr.Baboff- HbA1C- 7.4]; recommend dietary intervention. ? ?# Mild-moderate arthritic pain-osteoarthritis [bil knee L >R] G-1-2; STABLE;  continue tylenol prn.  .  ? ?# vit D def [on ergocalciferol]--STABLE;  ? ?# CKD- Stage- IV- GFR 29 [Dr.Kolluru]; -stable ? ?# NOV 2021- OsteoporosisT-scoreof -3.7.on ca+vit D. Prolia q 6 months [July, 9144]. Again in 2023/next visit.  ? ?#Incidental findings on Imaging  April PET, 2023: Grossly stable known pancreatic lesions without hypermetabolic ?Activity;  Interval in mild enlargement of an abdominal aortic aneurysm measuring 3.7 cm. Recommend follow-up every 2 years; I reviewed/discussed/counseled the patient.  ? ?# DISPOSITION: ?# follow up in 3-4 weeks- MD; labs- cbc/cmp; QP84;83; CEA; ca15-3; MRI Brain' added Prolia ; Dr.B ? ?

## 2021-08-29 NOTE — Progress Notes (Signed)
Patient reports having new feeling of weakness.   Increased pain in her legs but mainly in the lower legs with L>R. ?

## 2021-08-30 LAB — CANCER ANTIGEN 15-3: CA 15-3: 36.4 U/mL — ABNORMAL HIGH (ref 0.0–25.0)

## 2021-08-30 LAB — CEA: CEA: 4.6 ng/mL (ref 0.0–4.7)

## 2021-08-30 LAB — CANCER ANTIGEN 27.29: CA 27.29: 40.8 U/mL — ABNORMAL HIGH (ref 0.0–38.6)

## 2021-09-02 ENCOUNTER — Encounter (INDEPENDENT_AMBULATORY_CARE_PROVIDER_SITE_OTHER): Payer: Self-pay | Admitting: Vascular Surgery

## 2021-09-02 ENCOUNTER — Ambulatory Visit (INDEPENDENT_AMBULATORY_CARE_PROVIDER_SITE_OTHER): Payer: Medicare PPO | Admitting: Vascular Surgery

## 2021-09-02 ENCOUNTER — Ambulatory Visit (INDEPENDENT_AMBULATORY_CARE_PROVIDER_SITE_OTHER): Payer: Medicare PPO

## 2021-09-02 VITALS — BP 191/75 | HR 65 | Resp 15 | Wt 145.0 lb

## 2021-09-02 DIAGNOSIS — I701 Atherosclerosis of renal artery: Secondary | ICD-10-CM | POA: Diagnosis not present

## 2021-09-02 DIAGNOSIS — E7800 Pure hypercholesterolemia, unspecified: Secondary | ICD-10-CM | POA: Diagnosis not present

## 2021-09-02 DIAGNOSIS — I150 Renovascular hypertension: Secondary | ICD-10-CM | POA: Diagnosis not present

## 2021-09-02 NOTE — Progress Notes (Signed)
? ? ?MRN : [id number] ? ?Tina Curtis is a 88 y.o. (July 07, 1926) female who presents with chief complaint of  ?Chief Complaint  ?Patient presents with  ? Follow-up  ?  Ultrasound follow up  ?. ? ?History of Present Illness: Patient returns today in follow up of her renal artery stenosis.  She is status post stent placement for a solitary right kidney with renal artery stenosis in the past with marked improvement in her blood pressure following intervention.  She follows with nephrology for chronic kidney disease.  No major changes or issues in [place] far she knows.  Her blood pressure control has been better after intervention although it is high today.  Duplex shows a patent right renal artery stent and stable right kidney size with a known atrophic left kidney.  Also of note, her aorta measures 3.4 cm which is essentially unchanged from her study in 2021. ? ?Current Outpatient Medications  ?Medication Sig Dispense Refill  ? allopurinol (ZYLOPRIM) 100 MG tablet     ? aspirin 81 MG EC tablet Take by mouth.    ? calcitRIOL (ROCALTROL) 0.25 MCG capsule Take by mouth.    ? citalopram (CELEXA) 10 MG tablet Take 10 mg by mouth daily.     ? clopidogrel (PLAVIX) 75 MG tablet TAKE 1 TABLET BY MOUTH EVERY DAY 30 tablet 1  ? docusate sodium (COLACE) 100 MG capsule Take 100 mg by mouth daily as needed for mild constipation.    ? DULoxetine (CYMBALTA) 60 MG capsule Take 60 mg by mouth daily.     ? ergocalciferol (VITAMIN D2) 1.25 MG (50000 UT) capsule Take 1 capsule by mouth once a week.    ? furosemide (LASIX) 20 MG tablet Take 20 mg by mouth daily.     ? gabapentin (NEURONTIN) 300 MG capsule Take 300 mg by mouth at bedtime.    ? loratadine (CLARITIN) 10 MG tablet Take 10 mg by mouth daily as needed for allergies. AM    ? metoprolol succinate (TOPROL-XL) 25 MG 24 hr tablet Take 37.5 mg by mouth at bedtime.     ? simvastatin (ZOCOR) 20 MG tablet Take 20 mg by mouth every evening. PM    ? amLODipine (NORVASC) 5 MG tablet Take 2.5  mg by mouth daily.  (Patient not taking: Reported on 06/16/2021)  1  ? anastrozole (ARIMIDEX) 1 MG tablet Take 1 tablet (1 mg total) by mouth daily. DO NOT START if RASH is NOT better (Patient not taking: Reported on 09/02/2021) 90 tablet 3  ? ?No current facility-administered medications for this visit.  ? ? ?Past Medical History:  ?Diagnosis Date  ? Arthritis   ? Gout  ? Chronic kidney disease   ? Diarrhea   ? Hypercholesteremia   ? Hypertension   ? Neuropathy   ? feet and lower legs  ? Seasonal allergies   ? Skin cancer   ? face  ? ? ?Past Surgical History:  ?Procedure Laterality Date  ? ABDOMINAL HYSTERECTOMY  2000  ? APPENDECTOMY    ? BREAST BIOPSY Right 09/19/2019  ? affirm bx of calcs UOQ, x marker, path pending  ? BREAST BIOPSY Right 09/19/2019  ? [place] bx of mass,heart marker, path pending  ? BREAST BIOPSY Right 09/19/2019  ? [place] bx of LN, coil marker, path pending  ? CATARACT EXTRACTION Left   ? CATARACT EXTRACTION W/PHACO Right 10/24/2014  ? Procedure: CATARACT EXTRACTION PHACO AND INTRAOCULAR LENS PLACEMENT (IOC);  Surgeon: Leandrew Koyanagi, MD;  Location:  [place];  Service: Ophthalmology;  Laterality: Right;  ? ESOPHAGOGASTRODUODENOSCOPY N/A 03/07/2018  ? Procedure: ESOPHAGOGASTRODUODENOSCOPY (EGD);  Surgeon: Lin Landsman, MD;  Location: [facility] ENDOSCOPY;  Service: Gastroenterology;  Laterality: N/A;  ? MASTECTOMY MODIFIED RADICAL Right 10/13/2019  ? Procedure: MASTECTOMY MODIFIED RADICAL;  Surgeon: Robert Bellow, MD;  Location: [facility];  Service: General;  Laterality: Right;  ? RENAL ANGIOGRAPHY Right 04/11/2018  ? Procedure: RENAL ANGIOGRAPHY;  Surgeon: Algernon Huxley, MD;  Location: [place] [facility];  Service: Cardiovascular;  Laterality: Right;  ? SIMPLE MASTECTOMY WITH AXILLARY SENTINEL NODE BIOPSY Left 10/13/2019  ? Procedure: SIMPLE MASTECTOMY TRUE CUT BIOPSY, SENTINEL NODE BIOPSY;  Surgeon: Robert Bellow, MD;  Location: [facility];  Service: General;  Laterality: Left;   ? ? ? ?Social History  ? ?Tobacco Use  ? Smoking status: Never  ? Smokeless tobacco: Never  ?Substance Use Topics  ? Alcohol use: No  ? Drug use: Never  ? ? ? ? ?Family History  ?Problem Relation Age of Onset  ? Hypertension Mother   ? Hypertension Father   ? Stroke Father   ? Breast cancer Daughter 68  ? ? ? ?Allergies  ?Allergen Reactions  ? Penicillins Anaphylaxis  ? Drug Ingredient [Zinc]   ? ? ?REVIEW OF SYSTEMS (Negative unless checked) ?  ?Constitutional: '[]'$ Weight loss  '[]'$ Fever  '[]'$ Chills ?Cardiac: '[]'$ Chest pain   '[]'$ Chest pressure   '[]'$ Palpitations   '[]'$ Shortness of breath when laying flat   '[]'$ Shortness of breath at rest   '[]'$ Shortness of breath with exertion. ?Vascular:  '[]'$ Pain in legs with walking   '[]'$ Pain in legs at rest   '[]'$ Pain in legs when laying flat   '[]'$ Claudication   '[]'$ Pain in feet when walking  '[]'$ Pain in feet at rest  '[]'$ Pain in feet when laying flat   '[]'$ History of DVT   '[]'$ Phlebitis   '[]'$ Swelling in legs   '[]'$ Varicose veins   '[]'$ Non-healing ulcers ?Pulmonary:   '[]'$ Uses home oxygen   '[]'$ Productive cough   '[]'$ Hemoptysis   '[]'$ Wheeze  '[]'$ COPD   '[]'$ Asthma ?Neurologic:  '[]'$ Dizziness  '[]'$ Blackouts   '[]'$ Seizures   '[]'$ History of stroke   '[]'$ History of TIA  '[]'$ Aphasia   '[]'$ Temporary blindness   '[]'$ Dysphagia   '[]'$ Weakness or numbness in arms   '[x]'$ Weakness or numbness in legs ?Musculoskeletal:  '[x]'$ Arthritis   '[]'$ Joint swelling   '[x]'$ Joint pain   '[]'$ Low back pain ?Hematologic:  '[]'$ Easy bruising  '[]'$ Easy bleeding   '[]'$ Hypercoagulable state   '[]'$ Anemic  '[]'$ Hepatitis ?Gastrointestinal:  '[]'$ Blood in stool   '[]'$ Vomiting blood  '[]'$ Gastroesophageal reflux/heartburn   '[]'$ Abdominal pain ?Genitourinary:  '[x]'$ Chronic kidney disease   '[x]'$ Difficult urination  '[]'$ Frequent urination  '[]'$ Burning with urination   '[]'$ Hematuria ?Skin:  '[]'$ Rashes   '[]'$ Ulcers   '[]'$ Wounds ?Psychological:  '[]'$ History of anxiety   '[]'$  History of major depression. ? ?Physical Examination ? ?BP (!) 191/75 (BP Location: Left Arm)   Pulse 65   Resp 15   Wt 145 lb (65.8 kg)   BMI 24.89 kg/m?   ?Gen:  WD/WN, NAD ?Head: [place]/AT, No temporalis wasting. ?Ear/Nose/Throat: Hearing grossly intact, nares w/o erythema or drainage ?Eyes: Conjunctiva clear. Sclera non-icteric ?Neck: Supple.  Trachea midline ?Pulmonary:  Good air movement, no use of accessory muscles.  ?Cardiac: RRR, no JVD ?Vascular:  ?Vessel Right Left  ?Radial Palpable Palpable  ?    ?    ? ?Musculoskeletal: M/S 5/5 throughout.  No deformity or atrophy.  Walks with a walker.  No significant lower extremity edema. ?Neurologic:  Sensation grossly intact in extremities.  Symmetrical.  Speech is fluent.  ?Psychiatric: Judgment intact, Mood & affect appropriate for pt's clinical situation. ?Dermatologic: No rashes or ulcers noted.  No cellulitis or open wounds. ? ? ? ? ? ?Labs ?Recent Results (from the past 2160 hour(s))  ?Comprehensive metabolic panel     Status: Abnormal  ? Collection Time: 06/16/21  2:57 PM  ?Result Value Ref Range  ? Sodium 138 135 - 145 mmol/L  ? Potassium 4.0 3.5 - 5.1 mmol/L  ? Chloride 101 98 - 111 mmol/L  ? CO2 26 22 - 32 mmol/L  ? Glucose, Bld 166 (H) 70 - 99 mg/dL  ?  Comment: Glucose reference range applies only to samples taken after fasting for at least 8 hours.  ? BUN 54 (H) 8 - 23 mg/dL  ? Creatinine, Ser 1.64 (H) 0.44 - 1.00 mg/dL  ? Calcium 9.9 8.9 - 10.3 mg/dL  ? Total Protein 7.5 6.5 - 8.1 g/dL  ? Albumin 4.4 3.5 - 5.0 g/dL  ? AST 25 15 - 41 U/L  ? ALT 15 0 - 44 U/L  ? Alkaline Phosphatase 69 38 - 126 U/L  ? Total Bilirubin 0.2 (L) 0.3 - 1.2 mg/dL  ? GFR, Estimated 29 (L) >60 mL/min  ?  Comment: (NOTE) ?Calculated using the CKD-EPI Creatinine Equation (2021) ?  ? Anion gap 11 5 - 15  ?  Comment: Performed at [facility], [address], [place], [place] [address]  ?CBC     Status: None  ? Collection Time: 06/16/21  2:57 PM  ?Result Value Ref Range  ? WBC 7.2 4.0 - 10.5 K/uL  ? RBC 3.93 3.87 - 5.11 MIL/uL  ? Hemoglobin 12.8 12.0 - 15.0 g/dL  ? HCT 37.2 36.0 - 46.0 %  ? MCV 94.7 80.0 - 100.0 fL  ? MCH 32.6 26.0  - 34.0 pg  ? MCHC 34.4 30.0 - 36.0 g/dL  ? RDW 13.3 11.5 - 15.5 %  ? Platelets 171 150 - 400 K/uL  ? nRBC 0.0 0.0 - 0.2 %  ?  Comment: Performed at [facility], [address], [place], [place] [address]  ?C

## 2021-09-16 ENCOUNTER — Ambulatory Visit
Admission: RE | Admit: 2021-09-16 | Discharge: 2021-09-16 | Disposition: A | Discharge status: Home / Self Care | Payer: Medicare PPO | Source: Ambulatory Visit | Attending: Internal Medicine | Admitting: Internal Medicine

## 2021-09-16 DIAGNOSIS — Z170 Estrogen receptor positive status [ER+]: Secondary | ICD-10-CM | POA: Diagnosis present

## 2021-09-16 DIAGNOSIS — R519 Headache, unspecified: Secondary | ICD-10-CM | POA: Diagnosis present

## 2021-09-16 DIAGNOSIS — K769 Liver disease, unspecified: Secondary | ICD-10-CM | POA: Diagnosis present

## 2021-09-16 DIAGNOSIS — C50211 Malignant neoplasm of upper-inner quadrant of right female breast: Secondary | ICD-10-CM | POA: Diagnosis present

## 2021-09-16 MED ORDER — GADOBUTROL 1 MMOL/ML IV SOLN
6.0000 mL | Freq: Once | INTRAVENOUS | Status: AC | PRN
  Administered 2021-09-16: 6 mL via INTRAVENOUS

## 2021-09-18 ENCOUNTER — Inpatient Hospital Stay: Payer: Medicare PPO | Attending: Internal Medicine | Admitting: Internal Medicine

## 2021-09-18 ENCOUNTER — Inpatient Hospital Stay: Payer: Medicare PPO

## 2021-09-18 VITALS — BP 149/84 | HR 69 | Temp 98.7°F | Ht 64.0 in | Wt 147.0 lb

## 2021-09-18 DIAGNOSIS — Z90710 Acquired absence of both cervix and uterus: Secondary | ICD-10-CM | POA: Diagnosis not present

## 2021-09-18 DIAGNOSIS — Z170 Estrogen receptor positive status [ER+]: Secondary | ICD-10-CM

## 2021-09-18 DIAGNOSIS — M1712 Unilateral primary osteoarthritis, left knee: Secondary | ICD-10-CM | POA: Diagnosis not present

## 2021-09-18 DIAGNOSIS — K769 Liver disease, unspecified: Secondary | ICD-10-CM

## 2021-09-18 DIAGNOSIS — N184 Chronic kidney disease, stage 4 (severe): Secondary | ICD-10-CM | POA: Diagnosis not present

## 2021-09-18 DIAGNOSIS — Z853 Personal history of malignant neoplasm of breast: Secondary | ICD-10-CM | POA: Insufficient documentation

## 2021-09-18 DIAGNOSIS — M810 Age-related osteoporosis without current pathological fracture: Secondary | ICD-10-CM

## 2021-09-18 DIAGNOSIS — C50211 Malignant neoplasm of upper-inner quadrant of right female breast: Secondary | ICD-10-CM | POA: Diagnosis not present

## 2021-09-18 DIAGNOSIS — I129 Hypertensive chronic kidney disease with stage 1 through stage 4 chronic kidney disease, or unspecified chronic kidney disease: Secondary | ICD-10-CM | POA: Diagnosis not present

## 2021-09-18 DIAGNOSIS — Z803 Family history of malignant neoplasm of breast: Secondary | ICD-10-CM | POA: Diagnosis not present

## 2021-09-18 DIAGNOSIS — R519 Headache, unspecified: Secondary | ICD-10-CM | POA: Diagnosis not present

## 2021-09-18 DIAGNOSIS — E559 Vitamin D deficiency, unspecified: Secondary | ICD-10-CM | POA: Insufficient documentation

## 2021-09-18 LAB — COMPREHENSIVE METABOLIC PANEL
ALT: 16 U/L (ref 0–44)
AST: 24 U/L (ref 15–41)
Albumin: 4.3 g/dL (ref 3.5–5.0)
Alkaline Phosphatase: 95 U/L (ref 38–126)
Anion gap: 7 (ref 5–15)
BUN: 64 mg/dL — ABNORMAL HIGH (ref 8–23)
CO2: 26 mmol/L (ref 22–32)
Calcium: 9.8 mg/dL (ref 8.9–10.3)
Chloride: 105 mmol/L (ref 98–111)
Creatinine, Ser: 1.5 mg/dL — ABNORMAL HIGH (ref 0.44–1.00)
GFR, Estimated: 32 mL/min — ABNORMAL LOW (ref >60)
Glucose, Bld: 250 mg/dL — ABNORMAL HIGH (ref 70–99)
Potassium: 4.2 mmol/L (ref 3.5–5.1)
Sodium: 138 mmol/L (ref 135–145)
Total Bilirubin: 0.6 mg/dL (ref 0.3–1.2)
Total Protein: 7.6 g/dL (ref 6.5–8.1)

## 2021-09-18 LAB — CBC WITH DIFFERENTIAL/PLATELET
Abs Immature Granulocytes: 0.02 10*3/uL (ref 0.00–0.07)
Basophils Absolute: 0.1 10*3/uL (ref 0.0–0.1)
Basophils Relative: 1 %
Eosinophils Absolute: 0.3 10*3/uL (ref 0.0–0.5)
Eosinophils Relative: 5 %
HCT: 40 % (ref 36.0–46.0)
Hemoglobin: 13.4 g/dL (ref 12.0–15.0)
Immature Granulocytes: 0 %
Lymphocytes Relative: 25 %
Lymphs Abs: 1.5 10*3/uL (ref 0.7–4.0)
MCH: 31.4 pg (ref 26.0–34.0)
MCHC: 33.5 g/dL (ref 30.0–36.0)
MCV: 93.7 fL (ref 80.0–100.0)
Monocytes Absolute: 0.5 10*3/uL (ref 0.1–1.0)
Monocytes Relative: 9 %
Neutro Abs: 3.6 10*3/uL (ref 1.7–7.7)
Neutrophils Relative %: 60 %
Platelets: 151 10*3/uL (ref 150–400)
RBC: 4.27 MIL/uL (ref 3.87–5.11)
RDW: 13 % (ref 11.5–15.5)
WBC: 6 10*3/uL (ref 4.0–10.5)
nRBC: 0 % (ref 0.0–0.2)

## 2021-09-18 NOTE — Progress Notes (Signed)
Pt has a friend here with her today and would like to call daughter on the phone so she can listen in. ? ?MRI results. ?

## 2021-09-18 NOTE — Progress Notes (Signed)
one Tina Curtis ?CONSULT NOTE ? ?Patient Care Team: ?Tina Late, MD as PCP - General (Family Medicine) ?Tina Sickle, MD as Consulting Physician (Internal Medicine) ?Tina Bellow, MD as Consulting Physician (General Surgery) ?Tina Demark, RN as Oncology Nurse Navigator ?Tina Filbert, MD as Referring Physician (Radiation Oncology) ? ?CHIEF COMPLAINTS/PURPOSE OF CONSULTATION: Breast cancer ? 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 Burnett] stage III T2N3 [19/24 LN]; ER/PR positive HER-2 negative.  ? ?# Left breast-invasive mammary carcinoma-status post mastectomy [id number]; stage I-ER/PR positive HER-2 negative ? ?#November 23, 2019-start adjuvant radiation bilateral [finished August 31]; no chemo;  ? ?#September 22nd 2021- anastrozole ?----------------------------------------------------------------------------------------  ? ?A. RIGHT BREAST, 3:00; ULTRASOUND-GUIDED BIOPSY:  ?- INVASIVE MAMMARY CARCINOMA WITH MICROPAPILLARY FEATURES; Size of invasive carcinoma: 59m in this sample  ?Histologic grade of invasive carcinoma: Grade 2 ; ER/PR > 905: her 2- NEG ? ?A. RIGHT BREAST, 3:00; ULTRASOUND-GUIDED BIOPSY: - INVASIVE MAMMARY ?CARCINOMA WITH MICROPAPILLARY FEATURES. 137min this sample. Grade ?2. Lymphovascular invasion: Present. ?  ?B. LYMPH NODE, RIGHT AXILLARY; ULTRASOUND-GUIDED NEEDLE CORE BIOPSY: ?- METASTATIC MAMMARY CARCINOMA, 6 MM IN THIS SAMPLE. ?  ?C. RIGHT BREAST, LATERAL CALCIFICATIONS; STEREOTACTIC BIOPSY: - ?DUCTAL CARCINOMA IN SITU, INTERMEDIATE GRADE, WITH CALCIFICATIONS. ?-------------------------------------------------------------  ? ?# CKD- Stage III-IV [Dr.Kolluru] ? ?# SURVIVORSHIP:  ? ?# GENETICS:  ? ?DIAGNOSIS:  ?Right breast cancer stage III;  ?left breast cancer-stage I ?GOALS: Cure ? ?CURRENT/MOST RECENT THERAPY : Anastrozole ? ?  ?Carcinoma of  upper-inner quadrant of right breast in female, estrogen receptor positive ([facility] ?09/27/2019 Initial Diagnosis  ? Carcinoma of upper-inner quadrant of right breast in female, estrogen receptor positive ([facility]?  ? ? ? ?HISTORY OF PRESENTING ILLNESS: Ambulating walking with a rolling walker ; friend Daughter-Tina Curtis  over the phone. ? ?FaISABELA NARDELLI29.o.  female synchronous bilateral breast cancer [right stage III ER/PR positive;Her2 NEG; left stage I ER/PR positive; Her2- NEG] s/p adjuvant radiation; currently on Anastrazole is here for follow-up/review results of the MRI Brain ? ?Anastrozole was held about 3 weeks ago given patient's headaches dizzy spells and also fatigue. ? ?Patient has not noted any significant improvement in her symptoms. Patient continues to have ongoing fatigue.  Otherwise denies any chest pain or shortness of the cough. ? ?Review of Systems  ?Constitutional:  Positive for malaise/fatigue. Negative for chills, diaphoresis, fever and weight loss.  ?HENT:  Negative for nosebleeds and sore throat.   ?Eyes:  Negative for double vision.  ?Respiratory:  Negative for cough, hemoptysis, sputum production, shortness of breath and wheezing.   ?Cardiovascular:  Negative for chest pain, palpitations, orthopnea and leg swelling.  ?Gastrointestinal:  Negative for abdominal pain, blood in stool, constipation, diarrhea, heartburn, melena, nausea and vomiting.  ?Genitourinary:  Negative for dysuria, frequency and urgency.  ?Musculoskeletal:  Positive for back pain and joint pain.  ?Neurological:  Negative for dizziness, tingling, focal weakness, weakness and headaches.  ?Endo/Heme/Allergies:  Does not bruise/bleed easily.  ?Psychiatric/Behavioral:  Negative for depression. The patient is not nervous/anxious and does not have insomnia.    ? ?MEDICAL HISTORY:  ?Past Medical History:  ?Diagnosis Date  ? Arthritis   ? Gout  ? Chronic kidney disease   ? Diarrhea   ? Hypercholesteremia   ? Hypertension   ? Neuropathy    ? feet and lower legs  ? Seasonal allergies   ? Skin cancer   ? face  ? ? ?  SURGICAL HISTORY: ?Past Surgical History:  ?Procedure Laterality Date  ? ABDOMINAL HYSTERECTOMY  2000  ? APPENDECTOMY    ? BREAST BIOPSY Right 09/19/2019  ? affirm bx of calcs UOQ, x marker, path pending  ? BREAST BIOPSY Right 09/19/2019  ? [place] bx of mass,heart marker, path pending  ? BREAST BIOPSY Right 09/19/2019  ? [place] bx of LN, coil marker, path pending  ? CATARACT EXTRACTION Left   ? CATARACT EXTRACTION W/PHACO Right 10/24/2014  ? Procedure: CATARACT EXTRACTION PHACO AND INTRAOCULAR LENS PLACEMENT (IOC);  Surgeon: Leandrew Koyanagi, MD;  Location: [place];  Service: Ophthalmology;  Laterality: Right;  ? ESOPHAGOGASTRODUODENOSCOPY N/A 03/07/2018  ? Procedure: ESOPHAGOGASTRODUODENOSCOPY (EGD);  Surgeon: Lin Landsman, MD;  Location: [facility] ENDOSCOPY;  Service: Gastroenterology;  Laterality: N/A;  ? MASTECTOMY MODIFIED RADICAL Right 10/13/2019  ? Procedure: MASTECTOMY MODIFIED RADICAL;  Surgeon: Tina Bellow, MD;  Location: [facility];  Service: General;  Laterality: Right;  ? RENAL ANGIOGRAPHY Right 04/11/2018  ? Procedure: RENAL ANGIOGRAPHY;  Surgeon: Algernon Huxley, MD;  Location: [place] [facility];  Service: Cardiovascular;  Laterality: Right;  ? SIMPLE MASTECTOMY WITH AXILLARY SENTINEL NODE BIOPSY Left 10/13/2019  ? Procedure: SIMPLE MASTECTOMY TRUE CUT BIOPSY, SENTINEL NODE BIOPSY;  Surgeon: Tina Bellow, MD;  Location: [facility];  Service: General;  Laterality: Left;  ? ? ?SOCIAL HISTORY: ?Social History  ? ?Socioeconomic History  ? Marital status: Widowed  ?  Spouse name: Not on file  ? Number of children: Not on file  ? Years of education: Not on file  ? Highest education level: Not on file  ?Occupational History  ? Not on file  ?Tobacco Use  ? Smoking status: Never  ? Smokeless tobacco: Never  ?Substance and Sexual Activity  ? Alcohol use: No  ? Drug use: Never  ? Sexual activity: Not on file  ?Other Topics  Concern  ? Not on file  ?Social History Narrative  ? 2 daughters; lives in [place]; Pharmacist, 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   ? ?Social Determinants of Health  ? ?Financial Resource Strain: Not on file  ?Food Insecurity: Not on file  ?Transportation Needs: Not on file  ?Physical Activity: Not on file  ?Stress: Not on file  ?Social Connections: Not on file  ?Intimate Partner Violence: Not on file  ? ? ?FAMILY HISTORY: ?Family History  ?Problem Relation Age of Onset  ? Hypertension Mother   ? Hypertension Father   ? Stroke Father   ? Breast cancer Daughter 47  ? ? ?ALLERGIES:  is allergic to penicillins and drug ingredient [zinc]. ? ?MEDICATIONS:  ?Current Outpatient Medications  ?Medication Sig Dispense Refill  ? allopurinol (ZYLOPRIM) 100 MG tablet     ? aspirin 81 MG EC tablet Take by mouth.    ? calcitRIOL (ROCALTROL) 0.25 MCG capsule Take by mouth.    ? citalopram (CELEXA) 10 MG tablet Take 10 mg by mouth daily.     ? clopidogrel (PLAVIX) 75 MG tablet TAKE 1 TABLET BY MOUTH EVERY DAY 30 tablet 1  ? docusate sodium (COLACE) 100 MG capsule Take 100 mg by mouth daily as needed for mild constipation.    ? DULoxetine (CYMBALTA) 60 MG capsule Take 60 mg by mouth daily.     ? ergocalciferol (VITAMIN D2) 1.25 MG (50000 UT) capsule Take 1 capsule by mouth once a week.    ? furosemide (LASIX) 20 MG tablet Take 20 mg by mouth daily.     ?  gabapentin (NEURONTIN) 300 MG capsule Take 300 mg by mouth at bedtime.    ? loratadine (CLARITIN) 10 MG tablet Take 10 mg by mouth daily as needed for allergies. AM    ? metoprolol succinate (TOPROL-XL) 25 MG 24 hr tablet Take 37.5 mg by mouth at bedtime.     ? simvastatin (ZOCOR) 20 MG tablet Take 20 mg by mouth every evening. PM    ? amLODipine (NORVASC) 5 MG tablet Take 2.5 mg by mouth daily.  (Patient not taking: Reported on 06/16/2021)  1  ? anastrozole (ARIMIDEX) 1 MG tablet Take 1 tablet (1 mg total) by mouth daily. DO NOT START if RASH is NOT better  (Patient not taking: Reported on 09/02/2021) 90 tablet 3  ? ?No current facility-administered medications for this visit.  ? ? ?  ?. ? ?PHYSICAL EXAMINATION: ?ECOG PERFORMANCE STATUS: 0 - Asymptomatic ? ?V

## 2021-09-18 NOTE — Patient Instructions (Addendum)
#   recommend follow up with Dr.Shah re: worsening headaches.  ?

## 2021-09-18 NOTE — Assessment & Plan Note (Addendum)
#  Synchronous bilateral breast cancer-status post bilateral mastectomies- Right Breast-stage III ER/PR positive HER-2 NEG; & Left breast cancer stage II ER/PR Pos; Her 2 NEG. PET scan- April 20th, 2023-  Interval bilateral mastectomy and right axillary node dissection. No evidence of chest wall recurrence or thoracic metastatic disease.  Questionable small hypermetabolic lesion centrally in the right hepatic lobe (not definite; SEE BELOW);  No other evidence of metastatic disease. ? ??# RE-START anastrozole-as patient's fatigue/headaches have not improved holding anastrozole.  Will recommend MRI of the liver prior to next visit.  Ordered. ? ?# New onset headaches- ? [seen Dr.Shah > 4 years]; no nausea/ vomitting; on Tylenol prn; MAY 2023-  No acute intracranial pathology; Small remote lacunar infarct in the left basal ganglia and mild for age chronic white matter microangiopathy.  Given the ongoing dizziness recommend follow-up with neurology. ? ?# Elevated BG- PBF- 250 [Dr.Baboff- HbA1C- 7.4]; recommend dietary intervention; ? Defer to PCP re: oral hypoglycemic drugs. ? ?# Mild-moderate arthritic pain-osteoarthritis [bil knee L >R] G-1-2; STABLE;  continue tylenol prn.   ? ?# vit D def [on ergocalciferol]--STABLE;  ? ?# CKD- Stage- IV- GFR 29 [Dr.Kolluru]; -stable ? ?# NOV 2021- OsteoporosisT-scoreof -3.7.on ca+vit D. Prolia q 6 months [Feb 7741]. AUG  in 2023.  ? ?# DISPOSITION: ?# NO prolia  ?# follow up in 3 week- MD; labs- cbc/cmp; ca27;29; CEA; ca15-3; MRI  abdomen; Dr.B ? ?Cc; Dr.Baboff ? ?

## 2021-09-19 LAB — CANCER ANTIGEN 27.29: CA 27.29: 31.1 U/mL (ref 0.0–38.6)

## 2021-09-20 LAB — CEA: CEA: 4.2 ng/mL (ref 0.0–4.7)

## 2021-09-20 LAB — CANCER ANTIGEN 15-3: CA 15-3: 33.9 U/mL — ABNORMAL HIGH (ref 0.0–25.0)

## 2021-10-01 ENCOUNTER — Ambulatory Visit: Payer: Medicare PPO | Admitting: Podiatry

## 2021-10-01 ENCOUNTER — Encounter: Payer: Self-pay | Admitting: Podiatry

## 2021-10-01 DIAGNOSIS — D689 Coagulation defect, unspecified: Secondary | ICD-10-CM

## 2021-10-01 DIAGNOSIS — M79675 Pain in left toe(s): Secondary | ICD-10-CM | POA: Diagnosis not present

## 2021-10-01 DIAGNOSIS — D2372 Other benign neoplasm of skin of left lower limb, including hip: Secondary | ICD-10-CM | POA: Diagnosis not present

## 2021-10-01 DIAGNOSIS — D2371 Other benign neoplasm of skin of right lower limb, including hip: Secondary | ICD-10-CM | POA: Diagnosis not present

## 2021-10-01 DIAGNOSIS — B351 Tinea unguium: Secondary | ICD-10-CM | POA: Diagnosis not present

## 2021-10-01 DIAGNOSIS — M79674 Pain in right toe(s): Secondary | ICD-10-CM

## 2021-10-01 NOTE — Progress Notes (Signed)
She presents today chief complaint of painful elongated toenails and calluses.  Objective: Pulses are palpable bilateral.  Reactive benign skin lesions plantar aspect of the bilateral foot no open lesions or wounds are noted.  Toenails are long thick yellow dystrophic-like mycotic.  Assessment: Pain in limb secondary to onychomycosis and benign skin lesions.  Plan: Debridement of benign skin lesions debridement of toenails 1 through 5 bilaterally.  Follow-up with her in 2 to 3 months

## 2021-10-07 ENCOUNTER — Ambulatory Visit
Admission: RE | Admit: 2021-10-07 | Discharge: 2021-10-07 | Disposition: A | Discharge status: Home / Self Care | Payer: Medicare PPO | Source: Ambulatory Visit | Attending: Internal Medicine | Admitting: Internal Medicine

## 2021-10-07 DIAGNOSIS — K769 Liver disease, unspecified: Secondary | ICD-10-CM | POA: Insufficient documentation

## 2021-10-07 MED ORDER — GADOBUTROL 1 MMOL/ML IV SOLN
7.5000 mL | Freq: Once | INTRAVENOUS | Status: AC | PRN
  Administered 2021-10-07: 7.5 mL via INTRAVENOUS

## 2021-10-09 ENCOUNTER — Inpatient Hospital Stay: Payer: Medicare PPO | Attending: Internal Medicine

## 2021-10-09 ENCOUNTER — Encounter: Payer: Self-pay | Admitting: Internal Medicine

## 2021-10-09 ENCOUNTER — Inpatient Hospital Stay (HOSPITAL_BASED_OUTPATIENT_CLINIC_OR_DEPARTMENT_OTHER): Payer: Medicare PPO | Admitting: Internal Medicine

## 2021-10-09 DIAGNOSIS — Z79811 Long term (current) use of aromatase inhibitors: Secondary | ICD-10-CM | POA: Insufficient documentation

## 2021-10-09 DIAGNOSIS — K862 Cyst of pancreas: Secondary | ICD-10-CM | POA: Insufficient documentation

## 2021-10-09 DIAGNOSIS — Z170 Estrogen receptor positive status [ER+]: Secondary | ICD-10-CM | POA: Diagnosis not present

## 2021-10-09 DIAGNOSIS — I129 Hypertensive chronic kidney disease with stage 1 through stage 4 chronic kidney disease, or unspecified chronic kidney disease: Secondary | ICD-10-CM | POA: Diagnosis not present

## 2021-10-09 DIAGNOSIS — C50211 Malignant neoplasm of upper-inner quadrant of right female breast: Secondary | ICD-10-CM | POA: Diagnosis not present

## 2021-10-09 DIAGNOSIS — Z803 Family history of malignant neoplasm of breast: Secondary | ICD-10-CM | POA: Diagnosis not present

## 2021-10-09 DIAGNOSIS — C50811 Malignant neoplasm of overlapping sites of right female breast: Secondary | ICD-10-CM | POA: Insufficient documentation

## 2021-10-09 DIAGNOSIS — N184 Chronic kidney disease, stage 4 (severe): Secondary | ICD-10-CM | POA: Insufficient documentation

## 2021-10-09 DIAGNOSIS — E559 Vitamin D deficiency, unspecified: Secondary | ICD-10-CM | POA: Insufficient documentation

## 2021-10-09 DIAGNOSIS — I7143 Infrarenal abdominal aortic aneurysm, without rupture: Secondary | ICD-10-CM | POA: Diagnosis not present

## 2021-10-09 DIAGNOSIS — K769 Liver disease, unspecified: Secondary | ICD-10-CM

## 2021-10-09 DIAGNOSIS — R519 Headache, unspecified: Secondary | ICD-10-CM | POA: Diagnosis not present

## 2021-10-09 DIAGNOSIS — R5383 Other fatigue: Secondary | ICD-10-CM | POA: Insufficient documentation

## 2021-10-09 DIAGNOSIS — M170 Bilateral primary osteoarthritis of knee: Secondary | ICD-10-CM | POA: Diagnosis not present

## 2021-10-09 DIAGNOSIS — Z90710 Acquired absence of both cervix and uterus: Secondary | ICD-10-CM | POA: Diagnosis not present

## 2021-10-09 LAB — COMPREHENSIVE METABOLIC PANEL
ALT: 14 U/L (ref 0–44)
AST: 27 U/L (ref 15–41)
Albumin: 3.9 g/dL (ref 3.5–5.0)
Alkaline Phosphatase: 70 U/L (ref 38–126)
Anion gap: 8 (ref 5–15)
BUN: 54 mg/dL — ABNORMAL HIGH (ref 8–23)
CO2: 25 mmol/L (ref 22–32)
Calcium: 9.6 mg/dL (ref 8.9–10.3)
Chloride: 102 mmol/L (ref 98–111)
Creatinine, Ser: 1.61 mg/dL — ABNORMAL HIGH (ref 0.44–1.00)
GFR, Estimated: 30 mL/min — ABNORMAL LOW (ref >60)
Glucose, Bld: 253 mg/dL — ABNORMAL HIGH (ref 70–99)
Potassium: 4.3 mmol/L (ref 3.5–5.1)
Sodium: 135 mmol/L (ref 135–145)
Total Bilirubin: 0.4 mg/dL (ref 0.3–1.2)
Total Protein: 7.2 g/dL (ref 6.5–8.1)

## 2021-10-09 LAB — CBC WITH DIFFERENTIAL/PLATELET
Abs Immature Granulocytes: 0.02 10*3/uL (ref 0.00–0.07)
Basophils Absolute: 0.1 10*3/uL (ref 0.0–0.1)
Basophils Relative: 1 %
Eosinophils Absolute: 0.2 10*3/uL (ref 0.0–0.5)
Eosinophils Relative: 3 %
HCT: 36 % (ref 36.0–46.0)
Hemoglobin: 12.1 g/dL (ref 12.0–15.0)
Immature Granulocytes: 0 %
Lymphocytes Relative: 22 %
Lymphs Abs: 1.5 10*3/uL (ref 0.7–4.0)
MCH: 31.7 pg (ref 26.0–34.0)
MCHC: 33.6 g/dL (ref 30.0–36.0)
MCV: 94.2 fL (ref 80.0–100.0)
Monocytes Absolute: 0.5 10*3/uL (ref 0.1–1.0)
Monocytes Relative: 7 %
Neutro Abs: 4.5 10*3/uL (ref 1.7–7.7)
Neutrophils Relative %: 67 %
Platelets: 170 10*3/uL (ref 150–400)
RBC: 3.82 MIL/uL — ABNORMAL LOW (ref 3.87–5.11)
RDW: 13.1 % (ref 11.5–15.5)
WBC: 6.8 10*3/uL (ref 4.0–10.5)
nRBC: 0 % (ref 0.0–0.2)

## 2021-10-09 NOTE — Progress Notes (Signed)
MRI results

## 2021-10-09 NOTE — Assessment & Plan Note (Addendum)
#  Synchronous bilateral breast cancer-status post bilateral mastectomies- Right Breast-stage III ER/PR positive HER-2 NEG; & Left breast cancer stage II ER/PR Pos; Her 2 NEG. PET scan- April 20th, 2023-  Interval bilateral mastectomy and right axillary node dissection. No evidence of chest wall recurrence or thoracic metastatic disease.  Questionable small hypermetabolic lesion centrally in the right hepatic lobe; MRI LIVER  MAY 25th, 2023- NEGATIVE for any cancer; but cysts [see below].   # CONTINUE  anastrozole-as patient's fatigue/headaches have not improved holding anastrozole.   # MAY 2023[facility]]-  Multi cystic lesions of the pancreas with enlargement measuring 2.6 cm greatest axial dimension; with relative stability since 2021. These are likely side branch intraductal papillary mucinous neoplasms, numerous but without high-risk features aside from enlargement since 2019. Continued attention on subsequent imaging or dedicated MRI/MRCP in 1-2 years [may 2024-25].   # New onset headaches- ? [seen Dr.Shah > 4 years];  MRI- Brain May  2023-  No acute intracranial pathology. S/p evaluation with Dr.Shah- on baclofen.   # Elevated BG- PBF- 250 [Dr.Baboff- HbA1C- 7.4]; Awaiting re-evaluation in 2 weeks.  Defer to PCP re: oral hypoglycemic drugs.  # Mild-moderate arthritic pain-osteoarthritis [bil knee L >R] G-1-2; STABLE;  continue tylenol prn.    # vit D def [on ergocalciferol]--STABLE  # Fatigue ? Etiology; refer to care program s/p Evaluation with Gwenette Greet.   # CKD- Stage- IV- GFR 29 [Dr.Kolluru]; -stable  # NOV 2021- OsteoporosisT-scoreof -3.7.on ca+vit D. Prolia q 6 months [Feb 1834]. AUG  in 2023.   #Incidental findings on Imaging  MAY , 2023:3. 3.6 x 3.3 cm infrarenal abdominal aortic aneurysm is increased since 2019 and stable since April of 2023, enlarged since 2019 and 2021 evaluations;  Sludge in the gallbladder. I reviewed/discussed/counseled the patient.   # DISPOSITION:  #  CARE program  referral re: fatigue/gait instability # follow up in 3 month- MD; labs- cbc/cmp; ca27;29; CEA; ca15-3; 25-OH vit D-Zometa Dr.B  Cc; Dr.Baboff

## 2021-10-09 NOTE — Progress Notes (Signed)
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 Burnett] stage III T2N3 [19/24 LN]; ER/PR positive HER-2 negative.   # Left breast-invasive mammary carcinoma-status post mastectomy [id number]; stage I-ER/PR positive HER-2 negative  #November 23, 2019-start adjuvant radiation bilateral [finished August 31]; no chemo;   #September 22nd 2021- anastrozole ----------------------------------------------------------------------------------------   A. RIGHT BREAST, 3:00; ULTRASOUND-GUIDED BIOPSY:  - INVASIVE MAMMARY CARCINOMA WITH MICROPAPILLARY FEATURES; Size of invasive carcinoma: 39m in this sample  Histologic grade of invasive carcinoma: Grade 2 ; ER/PR > 905: her 2- NEG  A. RIGHT BREAST, 3:00; ULTRASOUND-GUIDED BIOPSY: - INVASIVE MAMMARY CARCINOMA WITH MICROPAPILLARY FEATURES. 172min this sample. Grade 2. Lymphovascular invasion: Present.   B. LYMPH NODE, RIGHT AXILLARY; ULTRASOUND-GUIDED NEEDLE CORE BIOPSY: - METASTATIC MAMMARY CARCINOMA, 6 MM IN THIS SAMPLE.   C. RIGHT BREAST, LATERAL CALCIFICATIONS; STEREOTACTIC BIOPSY: - DUCTAL CARCINOMA IN SITU, INTERMEDIATE GRADE, WITH CALCIFICATIONS. -------------------------------------------------------------   # CKD- Stage III-IV [Dr.Kolluru]  # SURVIVORSHIP:   # GENETICS:   DIAGNOSIS:  Right breast cancer stage III;  left breast cancer-stage I GOALS: Cure  CURRENT/MOST RECENT THERAPY : Anastrozole    Carcinoma of  upper-inner quadrant of right breast in female, estrogen receptor positive ([facility] 09/27/2019 Initial Diagnosis   Carcinoma of upper-inner quadrant of right breast in female, estrogen receptor positive ([facility]     HISTORY OF PRESENTING ILLNESS: Ambulating walking with a rolling walker.  Alone.  Tina TAMPLIN89.o.  female synchronous bilateral breast cancer [right stage III ER/PR positive;Her2 NEG; left stage I ER/PR positive; Her2- NEG] s/p adjuvant radiation; currently on Anastrazole is here for follow-up/review results of the MRI liver.   Status post evaluation with neurology in the interim for headaches.  Prescribed baclofen.   Patient continues to have ongoing fatigue.  Otherwise denies any chest pain or shortness of the cough.   Review of Systems  Constitutional:  Positive for malaise/fatigue. Negative for chills, diaphoresis, fever and weight loss.  HENT:  Negative for nosebleeds and sore throat.   Eyes:  Negative for double vision.  Respiratory:  Negative for cough, hemoptysis, sputum production, shortness of breath and wheezing.   Cardiovascular:  Negative for chest pain, palpitations, orthopnea and leg swelling.  Gastrointestinal:  Negative for abdominal pain, blood in stool, constipation, diarrhea, heartburn, melena, nausea and vomiting.  Genitourinary:  Negative for dysuria, frequency and urgency.  Musculoskeletal:  Positive for back pain and joint pain.  Neurological:  Negative for dizziness, tingling, focal weakness, weakness and headaches.  Endo/Heme/Allergies:  Does not bruise/bleed easily.  Psychiatric/Behavioral:  Negative for depression. The patient is not nervous/anxious and does not have insomnia.     MEDICAL HISTORY:  Past Medical History:  Diagnosis Date  . Arthritis    Gout  . Chronic kidney disease   . Diarrhea   . Hypercholesteremia   . Hypertension   . Neuropathy    feet and lower legs  . Seasonal allergies   . Skin cancer    face    SURGICAL  HISTORY: Past Surgical History:  Procedure Laterality Date  . ABDOMINAL  HYSTERECTOMY  2000  . APPENDECTOMY    . BREAST BIOPSY Right 09/19/2019   affirm bx of calcs UOQ, x marker, path pending  . BREAST BIOPSY Right 09/19/2019   us bx of mass,heart marker, path pending  . BREAST BIOPSY Right 09/19/2019   us bx of LN, coil marker, path pending  . CATARACT EXTRACTION Left   . CATARACT EXTRACTION W/PHACO Right 10/24/2014   Procedure: CATARACT EXTRACTION PHACO AND INTRAOCULAR LENS PLACEMENT (IOC);  Surgeon: Chadwick Brasington, MD;  Location: [facility];  Service: Ophthalmology;  Laterality: Right;  . ESOPHAGOGASTRODUODENOSCOPY N/A 03/07/2018   Procedure: ESOPHAGOGASTRODUODENOSCOPY (EGD);  Surgeon: Vanga, Rohini Reddy, MD;  Location: [facility];  Service: Gastroenterology;  Laterality: N/A;  . MASTECTOMY MODIFIED RADICAL Right 10/13/2019   Procedure: MASTECTOMY MODIFIED RADICAL;  Surgeon: Byrnett, Jeffrey W, MD;  Location: [facility];  Service: General;  Laterality: Right;  . RENAL ANGIOGRAPHY Right 04/11/2018   Procedure: RENAL ANGIOGRAPHY;  Surgeon: Dew, Jason S, MD;  Location: [facility];  Service: Cardiovascular;  Laterality: Right;  . SIMPLE MASTECTOMY WITH AXILLARY SENTINEL NODE BIOPSY Left 10/13/2019   Procedure: SIMPLE MASTECTOMY TRUE CUT BIOPSY, SENTINEL NODE BIOPSY;  Surgeon: Byrnett, Jeffrey W, MD;  Location: [facility];  Service: General;  Laterality: Left;    SOCIAL HISTORY: Social History   Socioeconomic History  . Marital status: Widowed    Spouse name: Not on file  . Number of children: Not on file  . Years of education: Not on file  . Highest education level: Not on file  Occupational History  . Not on file  Tobacco Use  . Smoking status: Never  . Smokeless tobacco: Never  Substance and Sexual Activity  . Alcohol use: No  . Drug use: Never  . Sexual activity: Not on file  Other Topics Concern  . Not on file  Social History Narrative   2 daughters;  lives in [place]; teacher retd. 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    Social Determinants of Health   Financial Resource Strain: Not on file  Food Insecurity: Not on file  Transportation Needs: Not on file  Physical Activity: Not on file  Stress: Not on file  Social Connections: Not on file  Intimate Partner Violence: Not on file    FAMILY HISTORY: Family History  Problem Relation Age of Onset  . Hypertension Mother   . Hypertension Father   . Stroke Father   . Breast cancer Daughter 54    ALLERGIES:  is allergic to penicillins and drug ingredient [zinc].  MEDICATIONS:  Current Outpatient Medications  Medication Sig Dispense Refill  . allopurinol (ZYLOPRIM) 100 MG tablet     . aspirin 81 MG EC tablet Take by mouth.    . Baclofen 5 MG TABS Take by mouth.    . calcitRIOL (ROCALTROL) 0.25 MCG capsule Take by mouth.    . citalopram (CELEXA) 10 MG tablet Take 10 mg by mouth daily.     . clopidogrel (PLAVIX) 75 MG tablet TAKE 1 TABLET BY MOUTH EVERY DAY 30 tablet 1  . docusate sodium (COLACE) 100 MG capsule Take 100 mg by mouth daily as needed for mild constipation.    . DULoxetine (CYMBALTA) 60 MG capsule Take 60 mg by mouth daily.     . ergocalciferol (VITAMIN D2) 1.25 MG (50000 UT) capsule Take 1 capsule by mouth once a week.    . furosemide (LASIX) 20 MG tablet Take 20 mg by mouth daily.     .   gabapentin (NEURONTIN) 300 MG capsule Take 300 mg by mouth at bedtime.    Marland Kitchen loratadine (CLARITIN) 10 MG tablet Take 10 mg by mouth daily as needed for allergies. AM    . metoprolol succinate (TOPROL-XL) 25 MG 24 hr tablet Take 37.5 mg by mouth at bedtime.     . simvastatin (ZOCOR) 20 MG tablet Take 20 mg by mouth every evening. PM     No current facility-administered medications for this visit.      Marland Kitchen  PHYSICAL EXAMINATION: ECOG PERFORMANCE STATUS: 0 - Asymptomatic  Vitals:   10/09/21 0902  BP: 134/69  Pulse: 65  Temp: 98.5 F (36.9 C)  SpO2: 100%     Filed Weights   10/09/21 0902  Weight: 145 lb 6.4 oz (66 kg)     Physical Exam Constitutional:      Comments: Walks with a rolling walker.  Alone.  HENT:     Head: Normocephalic and atraumatic.     Mouth/Throat:     Pharynx: No oropharyngeal exudate.  Eyes:     Pupils: Pupils are equal, round, and reactive to light.  Cardiovascular:     Rate and Rhythm: Normal rate and regular rhythm.  Pulmonary:     Effort: Pulmonary effort is normal. No respiratory distress.     Breath sounds: Normal breath sounds. No wheezing.  Abdominal:     General: Bowel sounds are normal. There is no distension.     Palpations: Abdomen is soft. There is no mass.     Tenderness: There is no abdominal tenderness. There is no guarding or rebound.  Musculoskeletal:        General: No tenderness. Normal range of motion.     Cervical back: Normal range of motion and neck supple.  Skin:    General: Skin is warm.     Comments: Bruising bruising noted on the chest and left arm.  Neurological:     Mental Status: She is alert and oriented to person, place, and time.  Psychiatric:        Mood and Affect: Affect normal.    Results for Tina, Curtis (MRN [id number]) as of 01/15/2021 15:37  Ref. Range 08/27/2020 09:55 09/02/2020 12:32 10/22/2020 14:01 12/04/2020 14:35 01/15/2021 14:50  CA 15-3 Latest Ref Range: 0.0 - 25.0 U/mL   32.0 (H) 27.9 (H)   CA 27.29 Latest Ref Range: 0.0 - 38.6 U/mL   38.0 27.8   CEA Latest Ref Range: 0.0 - 4.7 ng/mL   4.1 3.6    LABORATORY DATA:  I have reviewed the data as listed Lab Results  Component Value Date   WBC 6.8 10/09/2021   HGB 12.1 10/09/2021   HCT 36.0 10/09/2021   MCV 94.2 10/09/2021   PLT 170 10/09/2021   Recent Labs    08/29/21 0956 09/18/21 1029 10/09/21 0914  NA 138 138 135  K 3.8 4.2 4.3  CL 99 105 102  CO2 _0 GLUCOSE 258* 250* 253*  BUN 42* 64* 54*  CREATININE 1.71* 1.50* 1.61*  CALCIUM 9.8 9.8 9.6  GFRNONAA 28* 32* 30*  PROT 7.6 7.6 7.2   ALBUMIN 4.4 4.3 3.9  AST _1 ALT _2 ALKPHOS 72 95 70  BILITOT 0.4 0.6 0.4          RADIOGRAPHIC STUDIES: I have personally reviewed the radiological images as listed and agreed with the findings in the report. MR BRAIN W WO CONTRAST  Result Date: 09/16/2021  CLINICAL DATA:  Headache behind eyes, weakness for 1 month EXAM: MRI HEAD WITHOUT AND WITH CONTRAST TECHNIQUE: Multiplanar, multiecho pulse sequences of the brain and surrounding structures were obtained without and with intravenous contrast. CONTRAST:  77m GADAVIST GADOBUTROL 1 MMOL/ML IV SOLN COMPARISON:  Brain MRI 10/30/2018 FINDINGS: Brain: There is no acute intracranial hemorrhage, extra-axial fluid collection, or acute infarct. Parenchymal volume is normal for age. The ventricles are normal in size. There is a small remote lacunar infarct in the left basal ganglia. Additional patchy FLAIR signal abnormality in the subcortical and periventricular white matter likely reflects mild for age chronic white matter microangiopathy. There is no suspicious parenchymal signal abnormality. There is a small right parietal developmental venous anomaly. There is no other abnormal enhancement. There is no mass lesion. There is no mass effect or midline shift. Vascular: Normal flow voids. Skull and upper cervical spine: Normal marrow signal. Sinuses/Orbits: The paranasal sinuses are clear. Bilateral lens implants are in place. The globes and orbits are otherwise unremarkable. Other: There are trace mastoid effusions. The imaged nasopharynx is unremarkable. IMPRESSION: 1. No acute intracranial pathology. 2. Small remote lacunar infarct in the left basal ganglia and mild for age chronic white matter microangiopathy. 3. Trace mastoid effusions. Electronically Signed   By: PValetta MoleM.D.   On: 09/16/2021 16:45   MR ABDOMEN W WO CONTRAST  Result Date: 10/07/2021 CLINICAL DATA:  History of liver lesion in a 88year old female with history of  breast cancer found to have questionable area of increased metabolism in the RIGHT hepatic lobe on recent PET imaging. EXAM: MRI ABDOMEN WITHOUT AND WITH CONTRAST TECHNIQUE: Multiplanar multisequence MR imaging of the abdomen was performed both before and after the administration of intravenous contrast. CONTRAST:  7.52mGADAVIST GADOBUTROL 1 MMOL/ML IV SOLN COMPARISON:  August 22, 2021. FINDINGS: Lower chest: Lung bases are clear.  No effusion.  No consolidation. Hepatobiliary: Smooth hepatic contours. Mild prominence of the biliary tree is similar to prior imaging. No pericholecystic stranding or biliary duct dilation. Sludge in the gallbladder. No suspicious hepatic lesion. Pancreas: Multi-cystic appearance of the pancreas as before with some change in size of pancreatic lesions, see below. No signs of pancreatic inflammation. No signs of main duct dilation. Largest in the anterior body/neck of the pancreas measuring up to 2.6 cm previously approximately 2.2 cm in 2019 (image 19/4) Largest in the tail of the pancreas measuring 2.6 cm, (image 20/4) this is compared to 1.8 cm on more remote imaging from 2019. Multiple smaller cystic lesions throughout the pancreas with slight increase in size. Spleen:  Small cyst in the spleen.  Normal size spleen. Adrenals/Urinary Tract: Adrenal glands are normal. Bilateral small renal cysts are present which are not changed and for which no follow-up is recommended. Atrophy of the LEFT kidney is similar to the prior study. Stomach/Bowel: Limited assessment of the gastrointestinal tract without acute findings on this abdominal MRI. Vascular/Lymphatic: 3.6 x 3.3 cm infrarenal abdominal aortic aneurysm is increased since 2019 and stable since April of 2023. Other:  No ascites Musculoskeletal: No suspicious bone lesions identified. IMPRESSION: 1. Multi cystic lesions of the pancreas with enlargement measuring 2.6 cm greatest axial dimension. Enlargement since 2019 but with relative  stability since 2021. These are likely side branch intraductal papillary mucinous neoplasms, numerous but without high-risk features aside from enlargement since 2019. Continued attention on subsequent imaging or dedicated MRI/MRCP in 2 years as warranted. Lesions of this size with interval growth could also be considered for endoscopic  ultrasound evaluation, this would be considered a more aggressive approach in a patient of this age. 2. No evidence of metastatic disease in the abdomen. 3. 3.6 x 3.3 cm infrarenal abdominal aortic aneurysm is increased since 2019 and stable since April of 2023, enlarged since 2019 and 2021 evaluations. 4. Sludge in the gallbladder. Electronically Signed   By: Zetta Bills M.D.   On: 10/07/2021 11:16    ASSESSMENT & PLAN:   Carcinoma of upper-inner quadrant of right breast in female, estrogen receptor positive ([place])  # Synchronous bilateral breast cancer-status post bilateral mastectomies- Right Breast-stage III ER/PR positive HER-2 NEG; & Left breast cancer stage II ER/PR Pos; Her 2 NEG. PET scan- April 20th, 2023-  Interval bilateral mastectomy and right axillary node dissection. No evidence of chest wall recurrence or thoracic metastatic disease.  Questionable small hypermetabolic lesion centrally in the right hepatic lobe; MRI LIVER  MAY 25th, 2023- NEGATIVE for any cancer; but cysts [see below].    # CONTINUE  anastrozole-as patient's fatigue/headaches have not improved holding anastrozole.   # MAY 2023[facility]]-  Multi cystic lesions of the pancreas with enlargement measuring 2.6 cm greatest axial dimension; with relative stability since 2021. These are likely side branch intraductal papillary mucinous neoplasms, numerous but without high-risk features aside from enlargement since 2019. Continued attention on subsequent imaging or dedicated MRI/MRCP in 1-2 years [may 2024-25].   # New onset headaches- ? [seen Dr.Shah > 4 years];  MRI- Brain May  2023-  No  acute intracranial pathology. S/p evaluation with Dr.Shah- on baclofen.   # Elevated BG- PBF- 250 [Dr.Baboff- HbA1C- 7.4]; Awaiting re-evaluation in 2 weeks.  Defer to PCP re: oral hypoglycemic drugs.  # Mild-moderate arthritic pain-osteoarthritis [bil knee L >R] G-1-2; STABLE;  continue tylenol prn.    # vit D def [on ergocalciferol]--STABLE  # Fatigue ? Etiology; refer to care program s/p Evaluation with Gwenette Greet.   # CKD- Stage- IV- GFR 29 [Dr.Kolluru]; -stable  # NOV 2021- OsteoporosisT-scoreof -3.7.on ca+vit D. Prolia q 6 months [Feb 5397]. AUG  in 2023.   #Incidental findings on Imaging  MAY , 2023:3. 3.6 x 3.3 cm infrarenal abdominal aortic aneurysm is increased since 2019 and stable since April of 2023, enlarged since 2019 and 2021 evaluations;  Sludge in the gallbladder. I reviewed/discussed/counseled the patient.   # DISPOSITION:  # CARE program  referral re: fatigue/gait instability # follow up in 3 month- MD; labs- cbc/cmp; ca27;29; CEA; ca15-3; 25-OH vit D-Zometa Dr.B  Cc; Dr.Baboff   All questions were answered. The patient/family knows to call the clinic with any problems, questions or concerns.     Cammie Sickle, MD 10/09/2021 1:14 PM

## 2021-10-10 LAB — CANCER ANTIGEN 27.29: CA 27.29: 29.2 U/mL (ref 0.0–38.6)

## 2021-10-10 LAB — CANCER ANTIGEN 15-3: CA 15-3: 31.7 U/mL — ABNORMAL HIGH (ref 0.0–25.0)

## 2021-10-10 LAB — CEA: CEA: 4.2 ng/mL (ref 0.0–4.7)

## 2021-10-17 ENCOUNTER — Encounter: Payer: Self-pay | Admitting: Internal Medicine

## 2021-10-17 NOTE — Progress Notes (Signed)
I spoke with the daughter regarding the results of the MRI and the plan of care.  No evidence of any disease on the MRI.  Continue current therapy.  Recommend care program.

## 2021-10-29 ENCOUNTER — Inpatient Hospital Stay: Payer: Medicare PPO | Admitting: Occupational Therapy

## 2021-10-29 DIAGNOSIS — M6281 Muscle weakness (generalized): Secondary | ICD-10-CM

## 2021-10-29 NOTE — Therapy (Signed)
[facility] at [facility] [place], [place], [place], [address] Phone: [phone]   Fax:  [phone]  Occupational Therapy Evaluation  Patient Details  Name: Tina Curtis MRN: [id number] Date of Birth: June 20, 1926 No data recorded  Encounter Date: 10/29/2021   OT End of Session - 10/29/21 1301     Visit Number 0             Past Medical History:  Diagnosis Date   Arthritis    Gout   Chronic kidney disease    Diarrhea    Hypercholesteremia    Hypertension    Neuropathy    feet and lower legs   Seasonal allergies    Skin cancer    face    Past Surgical History:  Procedure Laterality Date   ABDOMINAL HYSTERECTOMY  2000   APPENDECTOMY     BREAST BIOPSY Right 09/19/2019   affirm bx of calcs UOQ, x marker, path pending   BREAST BIOPSY Right 09/19/2019   [place] bx of mass,heart marker, path pending   BREAST BIOPSY Right 09/19/2019   [place] bx of LN, coil marker, path pending   CATARACT EXTRACTION Left    CATARACT EXTRACTION W/PHACO Right 10/24/2014   Procedure: CATARACT EXTRACTION PHACO AND INTRAOCULAR LENS PLACEMENT ([place]);  Surgeon: Leandrew Koyanagi, MD;  Location: [place];  Service: Ophthalmology;  Laterality: Right;   ESOPHAGOGASTRODUODENOSCOPY N/A 03/07/2018   Procedure: ESOPHAGOGASTRODUODENOSCOPY (EGD);  Surgeon: Lin Landsman, MD;  Location: [facility] ENDOSCOPY;  Service: Gastroenterology;  Laterality: N/A;   MASTECTOMY MODIFIED RADICAL Right 10/13/2019   Procedure: MASTECTOMY MODIFIED RADICAL;  Surgeon: Robert Bellow, MD;  Location: [facility] ORS;  Service: General;  Laterality: Right;   RENAL ANGIOGRAPHY Right 04/11/2018   Procedure: RENAL ANGIOGRAPHY;  Surgeon: Algernon Huxley, MD;  Location: [place] CV LAB;  Service: Cardiovascular;  Laterality: Right;   SIMPLE MASTECTOMY WITH AXILLARY SENTINEL NODE BIOPSY Left 10/13/2019   Procedure: SIMPLE MASTECTOMY TRUE CUT BIOPSY, SENTINEL NODE BIOPSY;   Surgeon: Robert Bellow, MD;  Location: [facility] ORS;  Service: General;  Laterality: Left;    There were no vitals filed for this visit.   Subjective Assessment - 10/29/21 1259     Subjective  I am not doing as good as I did when I seen her last time.  A little lateral at [facility] and of the last.  I am independent still drive.  But my legs are weak currently feel heavy at times.  The the exercise class over there is just once a week.  I think I need more than that.  I did not had any falls.  I do get a little dizzy when I get up or if I turn my head.  I just wait a little bit.  I did not do the exercises should have may be since have seen you last time.    Currently in Pain? No/denies              ASSESSMENT & PLAN DR Rogue Bussing visit 10/09/21:    Carcinoma of upper-inner quadrant of right breast in female, estrogen receptor positive ([facility])  # Synchronous bilateral breast cancer-status post bilateral mastectomies- Right Breast-stage III ER/PR positive HER-2 NEG; & Left breast cancer stage II ER/PR Pos; Her 2 NEG. PET scan- April 20th, 2023-  Interval bilateral mastectomy and right axillary node dissection. No evidence of chest wall recurrence or thoracic metastatic disease.  Questionable small hypermetabolic lesion centrally in the  right hepatic lobe; MRI LIVER  MAY 25th, 2023- NEGATIVE for any cancer; but cysts [see below].     # CONTINUE  anastrozole-as patient's fatigue/headaches have not improved holding anastrozole.    # MAY 2023[facility] MRI]-  Multi cystic lesions of the pancreas with enlargement measuring 2.6 cm greatest axial dimension; with relative stability since 2021. These are likely side branch intraductal papillary mucinous neoplasms, numerous but without high-risk features aside from enlargement since 2019. Continued attention on subsequent imaging or dedicated MRI/MRCP in 1-2 years [may 2024-25].    # New onset headaches- ? [seen Dr.Shah > 4 years];  MRI- Brain May   2023-  No acute intracranial pathology. S/p evaluation with Dr.Shah- on baclofen.    # Elevated BG- PBF- 250 [Dr.Baboff- HbA1C- 7.4]; Awaiting re-evaluation in 2 weeks.  Defer to PCP re: oral hypoglycemic drugs.   # Mild-moderate arthritic pain-osteoarthritis [bil knee L >R] G-1-2; STABLE;  continue tylenol prn.     # vit D def [on ergocalciferol]--STABLE   # Fatigue ? Etiology; refer to care program s/p Evaluation with Gwenette Greet.    # CKD- Stage- IV- GFR 29 [Dr.Kolluru]; -stable   # NOV 2021- OsteoporosisT-scoreof -3.7.on ca+vit D. Prolia q 6 months [Feb 2876]. AUG  in 2023.    #Incidental findings on Imaging  MAY , 2023:3. 3.6 x 3.3 cm infrarenal abdominal aortic aneurysm is increased since 2019 and stable since April of 2023, enlarged since 2019 and 2021 evaluations;  Sludge in the gallbladder. I reviewed/discussed/counseled the patient.    # DISPOSITION:  # CARE program  referral re: fatigue/gait instability # follow up in 3 month- MD; labs- cbc/cmp; ca27;29; CEA; ca15-3; 25-OH vit D-Zometa Dr.B   OT SCREEN 10/29/21: Patient referred for OT screen by CARE program. Patient lives at [facility] independently and walk with a rolling walker in an outside of the house.  Patient up to about a month ago.  She walked about a mile on the sidewalk.  Patient denies falls.  Patient was seen last September after she had a fall.BERG balance test was done at that time scoring 41/56 putting her at the border of low to medium risk for 40. At that time patient was planning to continue with exercise classes at [facility] that she thought was surrounded by PT. did recommend at that time for patient to check in with physical therapy for exercises for for treatment. Patient now returns for assessment after referred to care program to increase her strength and balance. Patient arrive today with complaints of decreased activity, fatigue as well as decreased strength in lower extremities.  Legs feels heavy at times  during session patient reports some dizziness if turning head to look over her shoulder as well past when she gets up from supine to sit, or sit to stand.  She usually waits 10 seconds before she moves. She denies any falls. Berg balance test was performed today patient scored 34/56 putting her at medium risk for falling. Discussed with patient that I would recommend for her to do a few sessions of physical therapy at [facility] to increase her strength and decrease fall for risk and Increase balance-and when she is done with them and they have discharged her we can get her into the CARE program to maintain her activity level. Patient in agreement and very motivated.. [place] physical therapy department as well as request Dr. Rogue Bussing for referral to PT.  Patient to contact me in a week if she  did not hear back from physical therapy department at [facility].                                    Patient will benefit from skilled therapeutic intervention in order to improve the following deficits and impairments:           Visit Diagnosis: Muscle weakness (generalized)    Problem List Patient Active Problem List   Diagnosis Date Noted   Osteoporosis of forearm 03/28/2020   Breast cancer ([place]) 10/13/2019   Carcinoma of upper-inner quadrant of right breast in female, estrogen receptor positive ([place]) 09/27/2019   Anemia in chronic kidney disease 02/01/2019   Atherosclerosis of renal artery ([place]) 02/01/2019   Benign hypertensive kidney disease with chronic kidney disease 02/01/2019   Hyposmolality and/or hyponatremia 02/01/2019   Proteinuria 02/01/2019   Secondary hyperparathyroidism of renal origin ([place]) 02/01/2019   Chronic kidney disease, stage IV (severe) ([place]) 04/01/2018   Renovascular hypertension 04/01/2018   Renal artery stenosis ([facility]) 04/01/2018   Nausea & vomiting 03/05/2018   AKI (acute kidney injury) ([place]) 02/17/2018   Acquired  hammer toe of right foot 08/14/2014   Bunion of right foot 08/14/2014   Gout 08/14/2014   History of Clostridium difficile colitis 08/14/2014   Pure hypercholesterolemia 08/14/2014   RLS (restless legs syndrome) 08/14/2014   Hallux rigidus of right foot 04/24/2014   Primary osteoarthritis of foot 09/01/2013   Fatigue 09/30/2012   Gluteus medius or minimus syndrome 08/23/2012   Trochanteric bursitis 08/12/2012   Benign essential hypertension 09/03/2011   Diverticulosis of colon 09/03/2011   Osteopenia 09/03/2011   Enthesopathy of ankle and tarsus 04/13/2011   Osteoarthritis of spine with radiculopathy, lumbosacral region 09/30/2010   Allergic rhinitis 10/[birth year]28    Rosalyn Gess, OTR/L,CLT 10/29/2021, 1:01 PM  [facility] at [facility] [address] [place], [place], [address] Phone: [phone]   Fax:  [phone]  Name: Tina Curtis MRN: [id number] Date of Birth: 03-Jun-1926

## 2021-10-30 ENCOUNTER — Other Ambulatory Visit: Payer: Self-pay

## 2021-10-30 ENCOUNTER — Telehealth: Payer: Self-pay

## 2021-10-30 DIAGNOSIS — M6281 Muscle weakness (generalized): Secondary | ICD-10-CM

## 2021-10-30 NOTE — Therapy (Incomplete)
OUTPATIENT OCCUPATIONAL THERAPY NEURO EVALUATION  Patient Name: Tina Curtis MRN: [id number] [name]:1926-08-18, 88 y.o., female Today's Date: 10/30/2021  PCP: Dr. Baldemar Lenis REFERRING PROVIDER:  Dr. Wallace Going    Past Medical History:  Diagnosis Date   Arthritis    Gout   Chronic kidney disease    Diarrhea    Hypercholesteremia    Hypertension    Neuropathy    feet and lower legs   Seasonal allergies    Skin cancer    face   Past Surgical History:  Procedure Laterality Date   ABDOMINAL HYSTERECTOMY  2000   APPENDECTOMY     BREAST BIOPSY Right 09/19/2019   affirm bx of calcs UOQ, x marker, path pending   BREAST BIOPSY Right 09/19/2019   [place] bx of mass,heart marker, path pending   BREAST BIOPSY Right 09/19/2019   [place] bx of LN, coil marker, path pending   CATARACT EXTRACTION Left    CATARACT EXTRACTION W/PHACO Right 10/24/2014   Procedure: CATARACT EXTRACTION PHACO AND INTRAOCULAR LENS PLACEMENT ([place]);  Surgeon: Leandrew Koyanagi, MD;  Location: [place];  Service: Ophthalmology;  Laterality: Right;   ESOPHAGOGASTRODUODENOSCOPY N/A 03/07/2018   Procedure: ESOPHAGOGASTRODUODENOSCOPY (EGD);  Surgeon: Lin Landsman, MD;  Location: [facility] ENDOSCOPY;  Service: Gastroenterology;  Laterality: N/A;   MASTECTOMY MODIFIED RADICAL Right 10/13/2019   Procedure: MASTECTOMY MODIFIED RADICAL;  Surgeon: Robert Bellow, MD;  Location: [facility] ORS;  Service: General;  Laterality: Right;   RENAL ANGIOGRAPHY Right 04/11/2018   Procedure: RENAL ANGIOGRAPHY;  Surgeon: Algernon Huxley, MD;  Location: [place] CV LAB;  Service: Cardiovascular;  Laterality: Right;   SIMPLE MASTECTOMY WITH AXILLARY SENTINEL NODE BIOPSY Left 10/13/2019   Procedure: SIMPLE MASTECTOMY TRUE CUT BIOPSY, SENTINEL NODE BIOPSY;  Surgeon: Robert Bellow, MD;  Location: [facility] ORS;  Service: General;  Laterality: Left;   Patient Active Problem List   Diagnosis Date Noted   Osteoporosis of forearm 03/28/2020    Breast cancer ([place]) 10/13/2019   Carcinoma of upper-inner quadrant of right breast in female, estrogen receptor positive ([place]) 09/27/2019   Anemia in chronic kidney disease 02/01/2019   Atherosclerosis of renal artery ([place]) 02/01/2019   Benign hypertensive kidney disease with chronic kidney disease 02/01/2019   Hyposmolality and/or hyponatremia 02/01/2019   Proteinuria 02/01/2019   Secondary hyperparathyroidism of renal origin ([place]) 02/01/2019   Chronic kidney disease, stage IV (severe) ([place]) 04/01/2018   Renovascular hypertension 04/01/2018   Renal artery stenosis ([facility]) 04/01/2018   Nausea & vomiting 03/05/2018   AKI (acute kidney injury) ([place]) 02/17/2018   Acquired hammer toe of right foot 08/14/2014   Bunion of right foot 08/14/2014   Gout 08/14/2014   History of Clostridium difficile colitis 08/14/2014   Pure hypercholesterolemia 08/14/2014   RLS (restless legs syndrome) 08/14/2014   Hallux rigidus of right foot 04/24/2014   Primary osteoarthritis of foot 09/01/2013   Fatigue 09/30/2012   Gluteus medius or minimus syndrome 08/23/2012   Trochanteric bursitis 08/12/2012   Benign essential hypertension 09/03/2011   Diverticulosis of colon 09/03/2011   Osteopenia 09/03/2011   Enthesopathy of ankle and tarsus 04/13/2011   Osteoarthritis of spine with radiculopathy, lumbosacral region 09/30/2010   Allergic rhinitis 10/31/1928    ONSET DATE:09/30/21-referral date  REFERRING DIAG: ARMD  THERAPY DIAG:  No diagnosis found.  Rationale for Evaluation and Treatment Rehabilitation  SUBJECTIVE:   SUBJECTIVE STATEMENT: *** Pt accompanied by: {accompnied:27141}  PERTINENT HISTORY: ***  PRECAUTIONS: {Therapy precautions:24002}  WEIGHT BEARING RESTRICTIONS {Yes ***/No:24003}  PAIN:  Are you having pain? {OPRCPAIN:27236}  FALLS: Has patient fallen in last 6 months? {fallsyesno:27318}  LIVING ENVIRONMENT: Lives with: {OPRC lives with:25569::"lives with their family"} Lives  in: {Lives in:25570} Stairs: {opstairs:27293} Has following equipment at home: {Assistive devices:23999}  PLOF: {PLOF:24004}  PATIENT GOALS ***  OBJECTIVE:   HAND DOMINANCE: {MISC; OT HAND [phone]}  ADLs: Overall ADLs: *** Transfers/ambulation related to ADLs: Eating: *** Grooming: *** UB Dressing: *** LB Dressing: *** Toileting: *** Bathing: *** Tub Shower transfers: *** Equipment: {equipment:25573}   IADLs: Shopping: *** Light housekeeping: *** Meal Prep: *** Community mobility: *** Medication management: *** Financial management: *** Handwriting: {OTWRITTENEXPRESSION:25361}  MOBILITY STATUS: {OTMOBILITY:25360}  POSTURE COMMENTS:  {posture:25561} Sitting balance: {sitting balance:25483}  ACTIVITY TOLERANCE: Activity tolerance: ***  FUNCTIONAL OUTCOME MEASURES: {OTFUNCTIONALMEASURES:27238}  UPPER EXTREMITY ROM     {AROM/PROM:27142} ROM Right eval Left eval  Shoulder flexion    Shoulder abduction    Shoulder adduction    Shoulder extension    Shoulder internal rotation    Shoulder external rotation    Elbow flexion    Elbow extension    Wrist flexion    Wrist extension    Wrist ulnar deviation    Wrist radial deviation    Wrist pronation    Wrist supination    (Blank rows = not tested)    HAND FUNCTION: {handfunction:27230}  COORDINATION: {otcoordination:27237}  SENSATION: {sensation:27233}  EDEMA: ***    COGNITION: Overall cognitive status: {cognition:24006}  VISION: Subjective report: *** Baseline vision: {OTBASELINEVISION:25363} Visual history: macular degeneration  VISION ASSESSMENT: Impaired Reading acuity: {OTREADINGACUITY:25366} Ocular ROM: {OTOCULARROM:25367} Visual Fields: {OTVISUALFIELDS:25371} Depth perception: *** Visual acuity per MD OD 20/50, OS 20/400  Patient has difficulty with following activities due to following visual impairments: ***  PERCEPTION: {Perception:25564}  PRAXIS:  {Praxis:25565}  OBSERVATIONS: ***   TODAY'S TREATMENT:  ***   PATIENT EDUCATION: Education details: *** Person educated: {Person educated:25204} Education method: {Education Method:25205} Education comprehension: {Education Comprehension:25206}   HOME EXERCISE PROGRAM: ***    GOALS: Goals reviewed with patient? {yes/no:20286}  SHORT TERM GOALS: Target date: ***  *** Baseline: Goal status: {GOALSTATUS:25110}  2.  *** Baseline:  Goal status: {GOALSTATUS:25110}  3.  *** Baseline:  Goal status: {GOALSTATUS:25110}  4.  *** Baseline:  Goal status: {GOALSTATUS:25110}  5.  *** Baseline:  Goal status: {GOALSTATUS:25110}  6.  *** Baseline:  Goal status: {GOALSTATUS:25110}  LONG TERM GOALS: Target date: ***  *** Baseline:  Goal status: {GOALSTATUS:25110}  2.  *** Baseline:  Goal status: {GOALSTATUS:25110}  3.  *** Baseline:  Goal status: {GOALSTATUS:25110}  4.  *** Baseline:  Goal status: {GOALSTATUS:25110}  5.  *** Baseline:  Goal status: {GOALSTATUS:25110}  6.  *** Baseline:  Goal status: {GOALSTATUS:25110}  ASSESSMENT:  CLINICAL IMPRESSION: Patient is a 88 y.o. female who was seen today for occupational therapy evaluation for ARMD.   PERFORMANCE DEFICITS in functional skills including {OT physical skills:25468}, cognitive skills including {OT cognitive skills:25469}, and psychosocial skills including {OT psychosocial skills:25470}.   IMPAIRMENTS are limiting patient from {OT performance deficits:25471}.   COMORBIDITIES {Comorbidities:25485} that affects occupational performance. Patient will benefit from skilled OT to address above impairments and improve overall function.  MODIFICATION OR ASSISTANCE TO COMPLETE EVALUATION: {OT modification:25474}  OT OCCUPATIONAL PROFILE AND HISTORY: {OT PROFILE AND HISTORY:25484}  CLINICAL DECISION MAKING: {OT CDM:25475}  REHAB POTENTIAL: {rehabpotential:25112}  EVALUATION COMPLEXITY:  {Evaluation complexity:25115}    PLAN: OT FREQUENCY: {rehab frequency:25116}  OT DURATION: {rehab duration:25117}  PLANNED INTERVENTIONS: {OT Interventions:25467}  RECOMMENDED OTHER SERVICES: ***  CONSULTED AND AGREED WITH PLAN OF CARE: {ELF:81017}  PLAN FOR NEXT SESSION: ***   Theone Murdoch, OT 10/30/2021, 10:56 AM

## 2021-10-30 NOTE — Telephone Encounter (Signed)
Per secure char with Gwenette Greet Dupreez: Hi Dr B- she was referred by the care program for me to assess.  She lives at [facility] in the [place] independent.  Her balance test decreased from last September from 41/56-34/56 putting her in medium risk for falling.  She reports increased weakness and heaviness in the legs.  As well as she is shuffling a little bit more.  She also had some dizziness with turning her head.  Denies any falls.  I would recommend for her to have physical therapy evaluate and treat over [facility].  If you agree can they please send order for PT to eval and treat over to [facility] for her to be seen by their rehab - thank you-m  Per Dr. Jacinto Reap: Thank you for your assessment and update. Warnell Rasnic.-please make a referral to [facility] [place] for physical therapy. Re: risk of fall/debility.  Referral placed and faxed.

## 2021-10-31 ENCOUNTER — Ambulatory Visit: Payer: Medicare PPO | Attending: Ophthalmology | Admitting: Occupational Therapy

## 2021-10-31 DIAGNOSIS — H53412 Scotoma involving central area, left eye: Secondary | ICD-10-CM | POA: Diagnosis present

## 2021-10-31 DIAGNOSIS — R41842 Visuospatial deficit: Secondary | ICD-10-CM | POA: Diagnosis present

## 2022-01-05 ENCOUNTER — Encounter: Payer: Self-pay | Admitting: Podiatry

## 2022-01-05 ENCOUNTER — Ambulatory Visit: Payer: Medicare PPO | Admitting: Podiatry

## 2022-01-05 DIAGNOSIS — B351 Tinea unguium: Secondary | ICD-10-CM | POA: Diagnosis not present

## 2022-01-05 DIAGNOSIS — M79674 Pain in right toe(s): Secondary | ICD-10-CM

## 2022-01-05 DIAGNOSIS — D689 Coagulation defect, unspecified: Secondary | ICD-10-CM | POA: Diagnosis not present

## 2022-01-05 DIAGNOSIS — D2371 Other benign neoplasm of skin of right lower limb, including hip: Secondary | ICD-10-CM

## 2022-01-05 DIAGNOSIS — M79675 Pain in left toe(s): Secondary | ICD-10-CM | POA: Diagnosis not present

## 2022-01-05 DIAGNOSIS — D2372 Other benign neoplasm of skin of left lower limb, including hip: Secondary | ICD-10-CM

## 2022-01-05 NOTE — Progress Notes (Signed)
She presents today chief complaint of painful elongated toenails and multiple benign skin lesions.  She states that these are painful.  Objective: Vital signs are stable oriented x3.  Pulses are palpable.  Multiple benign skin lesions plantar aspect of the bilateral foot no open lesions or wounds are noted.  Toenails are long thick yellow dystrophic clinically mycotic.  Assessment: Pain in limb secondary to onychomycosis bilateral.  Multiple benign skin lesions plantar aspect forefoot bilateral  Plan: Agreement of toenails of benign skin lesions bilateral.

## 2022-01-09 ENCOUNTER — Inpatient Hospital Stay: Payer: Medicare PPO

## 2022-01-09 ENCOUNTER — Ambulatory Visit: Payer: Medicare PPO

## 2022-01-09 ENCOUNTER — Inpatient Hospital Stay: Payer: Medicare PPO | Admitting: Hospice and Palliative Medicine

## 2022-01-09 ENCOUNTER — Inpatient Hospital Stay: Payer: Medicare PPO | Attending: Internal Medicine

## 2022-01-09 ENCOUNTER — Encounter: Payer: Self-pay | Admitting: Hospice and Palliative Medicine

## 2022-01-09 ENCOUNTER — Ambulatory Visit: Payer: Medicare PPO | Admitting: Internal Medicine

## 2022-01-09 ENCOUNTER — Other Ambulatory Visit: Payer: Self-pay

## 2022-01-09 VITALS — BP 147/74 | HR 64 | Temp 98.6°F | Resp 18

## 2022-01-09 DIAGNOSIS — E559 Vitamin D deficiency, unspecified: Secondary | ICD-10-CM | POA: Diagnosis not present

## 2022-01-09 DIAGNOSIS — C50211 Malignant neoplasm of upper-inner quadrant of right female breast: Secondary | ICD-10-CM

## 2022-01-09 DIAGNOSIS — M810 Age-related osteoporosis without current pathological fracture: Secondary | ICD-10-CM | POA: Diagnosis not present

## 2022-01-09 DIAGNOSIS — C773 Secondary and unspecified malignant neoplasm of axilla and upper limb lymph nodes: Secondary | ICD-10-CM | POA: Diagnosis not present

## 2022-01-09 DIAGNOSIS — C50811 Malignant neoplasm of overlapping sites of right female breast: Secondary | ICD-10-CM | POA: Diagnosis present

## 2022-01-09 DIAGNOSIS — I129 Hypertensive chronic kidney disease with stage 1 through stage 4 chronic kidney disease, or unspecified chronic kidney disease: Secondary | ICD-10-CM | POA: Diagnosis not present

## 2022-01-09 DIAGNOSIS — Z170 Estrogen receptor positive status [ER+]: Secondary | ICD-10-CM

## 2022-01-09 DIAGNOSIS — M1712 Unilateral primary osteoarthritis, left knee: Secondary | ICD-10-CM | POA: Diagnosis not present

## 2022-01-09 DIAGNOSIS — N184 Chronic kidney disease, stage 4 (severe): Secondary | ICD-10-CM | POA: Diagnosis not present

## 2022-01-09 DIAGNOSIS — Z79811 Long term (current) use of aromatase inhibitors: Secondary | ICD-10-CM | POA: Diagnosis not present

## 2022-01-09 LAB — COMPREHENSIVE METABOLIC PANEL
ALT: 19 U/L (ref 0–44)
AST: 29 U/L (ref 15–41)
Albumin: 4.1 g/dL (ref 3.5–5.0)
Alkaline Phosphatase: 83 U/L (ref 38–126)
Anion gap: 9 (ref 5–15)
BUN: 42 mg/dL — ABNORMAL HIGH (ref 8–23)
CO2: 27 mmol/L (ref 22–32)
Calcium: 9.5 mg/dL (ref 8.9–10.3)
Chloride: 104 mmol/L (ref 98–111)
Creatinine, Ser: 1.59 mg/dL — ABNORMAL HIGH (ref 0.44–1.00)
GFR, Estimated: 30 mL/min — ABNORMAL LOW (ref >60)
Glucose, Bld: 167 mg/dL — ABNORMAL HIGH (ref 70–99)
Potassium: 3.7 mmol/L (ref 3.5–5.1)
Sodium: 140 mmol/L (ref 135–145)
Total Bilirubin: 0.6 mg/dL (ref 0.3–1.2)
Total Protein: 7.9 g/dL (ref 6.5–8.1)

## 2022-01-09 LAB — CBC WITH DIFFERENTIAL/PLATELET
Abs Immature Granulocytes: 0.03 10*3/uL (ref 0.00–0.07)
Basophils Absolute: 0.1 10*3/uL (ref 0.0–0.1)
Basophils Relative: 1 %
Eosinophils Absolute: 0.3 10*3/uL (ref 0.0–0.5)
Eosinophils Relative: 5 %
HCT: 38.8 % (ref 36.0–46.0)
Hemoglobin: 13.1 g/dL (ref 12.0–15.0)
Immature Granulocytes: 1 %
Lymphocytes Relative: 25 %
Lymphs Abs: 1.6 10*3/uL (ref 0.7–4.0)
MCH: 32.3 pg (ref 26.0–34.0)
MCHC: 33.8 g/dL (ref 30.0–36.0)
MCV: 95.8 fL (ref 80.0–100.0)
Monocytes Absolute: 0.6 10*3/uL (ref 0.1–1.0)
Monocytes Relative: 9 %
Neutro Abs: 3.8 10*3/uL (ref 1.7–7.7)
Neutrophils Relative %: 59 %
Platelets: 151 10*3/uL (ref 150–400)
RBC: 4.05 MIL/uL (ref 3.87–5.11)
RDW: 13.2 % (ref 11.5–15.5)
WBC: 6.3 10*3/uL (ref 4.0–10.5)
nRBC: 0 % (ref 0.0–0.2)

## 2022-01-09 LAB — VITAMIN D 25 HYDROXY (VIT D DEFICIENCY, FRACTURES): Vit D, 25-Hydroxy: 35.43 ng/mL (ref 30–100)

## 2022-01-09 MED ORDER — DENOSUMAB 60 MG/ML ~~LOC~~ SOSY
60.0000 mg | PREFILLED_SYRINGE | Freq: Once | SUBCUTANEOUS | Status: AC
  Administered 2022-01-09: 60 mg via SUBCUTANEOUS
  Filled 2022-01-09: qty 1

## 2022-01-09 NOTE — Progress Notes (Signed)
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 Burnett] stage III T2N3 [19/24 LN]; ER/PR positive HER-2 negative.   # Left breast-invasive mammary carcinoma-status post mastectomy [id number]; stage I-ER/PR positive HER-2 negative  #November 23, 2019-start adjuvant radiation bilateral [finished August 31]; no chemo;   #September 22nd 2021- anastrozole ----------------------------------------------------------------------------------------   A. RIGHT BREAST, 3:00; ULTRASOUND-GUIDED BIOPSY:  - INVASIVE MAMMARY CARCINOMA WITH MICROPAPILLARY FEATURES; Size of invasive carcinoma: 58m in this sample  Histologic grade of invasive carcinoma: Grade 2 ; ER/PR > 905: her 2- NEG  A. RIGHT BREAST, 3:00; ULTRASOUND-GUIDED BIOPSY: - INVASIVE MAMMARY CARCINOMA WITH MICROPAPILLARY FEATURES. 134min this sample. Grade 2. Lymphovascular invasion: Present.   B. LYMPH NODE, RIGHT AXILLARY; ULTRASOUND-GUIDED NEEDLE CORE BIOPSY: - METASTATIC MAMMARY CARCINOMA, 6 MM IN THIS SAMPLE.   C. RIGHT BREAST, LATERAL CALCIFICATIONS; STEREOTACTIC BIOPSY: - DUCTAL CARCINOMA IN SITU, INTERMEDIATE GRADE, WITH CALCIFICATIONS. -------------------------------------------------------------   # CKD- Stage III-IV [Dr.Kolluru]  # SURVIVORSHIP:   # GENETICS:   DIAGNOSIS:  Right breast cancer stage III;  left breast cancer-stage I GOALS: Cure  CURRENT/MOST RECENT THERAPY : Anastrozole     Carcinoma of upper-inner quadrant of right breast in female, estrogen receptor positive ([facility] 09/27/2019 Initial Diagnosis   Carcinoma of upper-inner quadrant of right breast in female, estrogen receptor positive ([facility]     HISTORY OF PRESENTING ILLNESS: Ambulating walking with a rolling walker.  Alone.  FaJALEXIS BREED89.o.  female synchronous bilateral breast cancer [right stage III ER/PR positive;Her2 NEG; left stage I ER/PR positive; Her2- NEG] s/p adjuvant radiation; currently on Anastrazole is here for follow-up/review of labs and to receive Prolia injection.  Patient denies significant changes or concerns.  She continues to endorse some mild fatigue but is been working with physical therapy and feels like she is overall improving.    Review of Systems  Constitutional:  Positive for malaise/fatigue. Negative for chills, diaphoresis, fever and weight loss.  HENT:  Negative for nosebleeds and sore throat.   Eyes:  Negative for double vision.  Respiratory:  Negative for cough, hemoptysis, sputum production, shortness of breath and wheezing.   Cardiovascular:  Negative for chest pain, palpitations, orthopnea and leg swelling.  Gastrointestinal:  Negative for abdominal pain, blood in stool, constipation, diarrhea, heartburn, melena, nausea and vomiting.  Genitourinary:  Negative for dysuria, frequency and urgency.  Musculoskeletal:  Positive for back pain and joint pain.  Neurological:  Negative for dizziness, tingling, focal weakness, weakness and headaches.  Endo/Heme/Allergies:  Does not bruise/bleed easily.  Psychiatric/Behavioral:  Negative for depression. The patient is not nervous/anxious and does not have insomnia.      MEDICAL HISTORY:  Past Medical History:  Diagnosis Date   Arthritis    Gout   Chronic kidney disease    Diarrhea    Hypercholesteremia    Hypertension    Neuropathy    feet and lower legs   Seasonal allergies    Skin cancer    face    SURGICAL  HISTORY: Past Surgical History:  Procedure Laterality Date   ABDOMINAL  HYSTERECTOMY  2000   APPENDECTOMY     BREAST BIOPSY Right 09/19/2019   affirm bx of calcs UOQ, x marker, path pending   BREAST BIOPSY Right 09/19/2019   [place] bx of mass,heart marker, path pending   BREAST BIOPSY Right 09/19/2019   [place] bx of LN, coil marker, path pending   CATARACT EXTRACTION Left    CATARACT EXTRACTION W/PHACO Right 10/24/2014   Procedure: CATARACT EXTRACTION PHACO AND INTRAOCULAR LENS PLACEMENT ([place]);  Surgeon: Leandrew Koyanagi, MD;  Location: [place];  Service: Ophthalmology;  Laterality: Right;   ESOPHAGOGASTRODUODENOSCOPY N/A 03/07/2018   Procedure: ESOPHAGOGASTRODUODENOSCOPY (EGD);  Surgeon: Lin Landsman, MD;  Location: [facility] ENDOSCOPY;  Service: Gastroenterology;  Laterality: N/A;   MASTECTOMY MODIFIED RADICAL Right 10/13/2019   Procedure: MASTECTOMY MODIFIED RADICAL;  Surgeon: Robert Bellow, MD;  Location: [facility] ORS;  Service: General;  Laterality: Right;   RENAL ANGIOGRAPHY Right 04/11/2018   Procedure: RENAL ANGIOGRAPHY;  Surgeon: Algernon Huxley, MD;  Location: [place] [facility];  Service: Cardiovascular;  Laterality: Right;   SIMPLE MASTECTOMY WITH AXILLARY SENTINEL NODE BIOPSY Left 10/13/2019   Procedure: SIMPLE MASTECTOMY TRUE CUT BIOPSY, SENTINEL NODE BIOPSY;  Surgeon: Robert Bellow, MD;  Location: [facility] ORS;  Service: General;  Laterality: Left;    SOCIAL HISTORY: Social History   Socioeconomic History   Marital status: Widowed    Spouse name: Not on file   Number of children: Not on file   Years of education: Not on file   Highest education level: Not on file  Occupational History   Not on file  Tobacco Use   Smoking status: Never   Smokeless tobacco: Never  Substance and Sexual Activity   Alcohol use: No   Drug use: Never   Sexual activity: Not on file  Other Topics Concern   Not on file  Social History Narrative   2 daughters; lives in  [place]; Pharmacist, 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    Social Determinants of Health   Financial Resource Strain: Not on file  Food Insecurity: Not on file  Transportation Needs: Not on file  Physical Activity: Not on file  Stress: Not on file  Social Connections: Not on file  Intimate Partner Violence: Not on file    FAMILY HISTORY: Family History  Problem Relation Age of Onset   Hypertension Mother    Hypertension Father    Stroke Father    Breast cancer Daughter 6    ALLERGIES:  is allergic to penicillins and drug ingredient [zinc].  MEDICATIONS:  Current Outpatient Medications  Medication Sig Dispense Refill   allopurinol (ZYLOPRIM) 100 MG tablet      aspirin 81 MG EC tablet Take by mouth.     Baclofen 5 MG TABS Take by mouth.     calcitRIOL (ROCALTROL) 0.25 MCG capsule Take by mouth.     citalopram (CELEXA) 10 MG tablet Take 10 mg by mouth daily.      clopidogrel (PLAVIX) 75 MG tablet TAKE 1 TABLET BY MOUTH EVERY DAY 30 tablet 1   docusate sodium (COLACE) 100 MG capsule Take 100 mg by mouth daily as needed for mild constipation.     DULoxetine (CYMBALTA) 60 MG capsule Take 60 mg by mouth daily.      ergocalciferol (VITAMIN D2) 1.25 MG (50000 UT) capsule Take 1 capsule by mouth once a week.     furosemide (LASIX) 20 MG tablet Take 20 mg by mouth daily.  gabapentin (NEURONTIN) 300 MG capsule Take 300 mg by mouth at bedtime.     loratadine (CLARITIN) 10 MG tablet Take 10 mg by mouth daily as needed for allergies. AM     metoprolol succinate (TOPROL-XL) 25 MG 24 hr tablet Take 37.5 mg by mouth at bedtime.      simvastatin (ZOCOR) 20 MG tablet Take 20 mg by mouth every evening. PM     No current facility-administered medications for this visit.   Facility-Administered Medications Ordered in Other Visits  Medication Dose Route Frequency Provider Last Rate Last Admin   denosumab (PROLIA) injection 60 mg  60 mg Subcutaneous Once Charlaine Dalton R, MD          .  PHYSICAL EXAMINATION: ECOG PERFORMANCE STATUS: 0 - Asymptomatic  Vitals:   01/09/22 1045  BP: (!) 147/74  Pulse: 64  Resp: 18  Temp: 98.6 F (37 C)  SpO2: 97%    There were no vitals filed for this visit.    Physical Exam Constitutional:      Comments: Walks with a rolling walker.  Alone.  HENT:     Head: Normocephalic and atraumatic.  Cardiovascular:     Rate and Rhythm: Normal rate and regular rhythm.  Pulmonary:     Effort: Pulmonary effort is normal. No respiratory distress.     Breath sounds: Normal breath sounds. No wheezing.  Abdominal:     General: Bowel sounds are normal. There is no distension.     Palpations: Abdomen is soft. There is no mass.     Tenderness: There is no abdominal tenderness. There is no guarding or rebound.  Musculoskeletal:        General: No tenderness. Normal range of motion.     Cervical back: Normal range of motion and neck supple.  Skin:    General: Skin is warm.  Neurological:     Mental Status: She is alert and oriented to person, place, and time.  Psychiatric:        Mood and Affect: Affect normal.     Results for SHENNA, BRISSETTE (MRN [id number]) as of 01/15/2021 15:37  Ref. Range 08/27/2020 09:55 09/02/2020 12:32 10/22/2020 14:01 12/04/2020 14:35 01/15/2021 14:50  CA 15-3 Latest Ref Range: 0.0 - 25.0 U/mL   32.0 (H) 27.9 (H)   CA 27.29 Latest Ref Range: 0.0 - 38.6 U/mL   38.0 27.8   CEA Latest Ref Range: 0.0 - 4.7 ng/mL   4.1 3.6    LABORATORY DATA:  I have reviewed the data as listed Lab Results  Component Value Date   WBC 6.3 01/09/2022   HGB 13.1 01/09/2022   HCT 38.8 01/09/2022   MCV 95.8 01/09/2022   PLT 151 01/09/2022   Recent Labs    09/18/21 1029 10/09/21 0914 01/09/22 1019  NA 138 135 140  K 4.2 4.3 3.7  CL 105 102 104  CO2 '26 25 27  ' GLUCOSE 250* 253* 167*  BUN 64* 54* 42*  CREATININE 1.50* 1.61* 1.59*  CALCIUM 9.8 9.6 9.5  GFRNONAA 32* 30* 30*  PROT 7.6 7.2 7.9  ALBUMIN 4.3  3.9 4.1  AST '24 27 29  ' ALT '16 14 19  ' ALKPHOS 95 70 83  BILITOT 0.6 0.4 0.6           RADIOGRAPHIC STUDIES: I have personally reviewed the radiological images as listed and agreed with the findings in the report. No results found.  ASSESSMENT & PLAN:   Carcinoma of upper-inner quadrant of  right breast in female, estrogen receptor positive ([place])  # Synchronous bilateral breast cancer-status post bilateral mastectomies- Right Breast-stage III ER/PR positive HER-2 NEG; & Left breast cancer stage II ER/PR Pos; Her 2 NEG. PET scan- April 20th, 2023-  Interval bilateral mastectomy and right axillary node dissection. No evidence of chest wall recurrence or thoracic metastatic disease.  Questionable small hypermetabolic lesion centrally in the right hepatic lobe; MRI LIVER  MAY 25th, 2023- NEGATIVE for any cancer; but cysts.  Continue anastrozole.  Tumor markers pending.   # Elevated BG-patient has been monitoring carbohydrate intake.  Glucose improved from previous baseline.  Follow-up with PCP.   # Mild-moderate arthritic pain-osteoarthritis [bil knee L >R] G-1-2; STABLE;  continue tylenol prn.     # vit D def [on ergocalciferol]--STABLE   # Fatigue -improving with PT   # CKD- Stage- IV- GFR 29 [Dr.Kolluru]; -stable   # NOV 2021- OsteoporosisT-scoreof -3.7.on ca+vit D.  Serum calcium 9.5.  Prolia q 6 months which patient received today.     # DISPOSITION:  follow up in 3 month- MD; labs- cbc/cmp; ca27;29; CEA; ca15-3; 25-OH vit D.  58-monthProlia      All questions were answered. The patient/family knows to call the clinic with any problems, questions or concerns.     JIrean Hong NP 01/09/2022 11:28 AM

## 2022-01-10 LAB — CANCER ANTIGEN 27.29: CA 27.29: 27.6 U/mL (ref 0.0–38.6)

## 2022-01-10 LAB — CANCER ANTIGEN 15-3: CA 15-3: 33 U/mL — ABNORMAL HIGH (ref 0.0–25.0)

## 2022-01-10 LAB — CEA: CEA: 5.4 ng/mL — ABNORMAL HIGH (ref 0.0–4.7)

## 2022-01-14 ENCOUNTER — Telehealth: Payer: Self-pay | Admitting: Radiation Oncology
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
## 2022-01-15 ENCOUNTER — Ambulatory Visit: Payer: Medicare PPO | Admitting: Radiation Oncology

## 2022-01-22 ENCOUNTER — Ambulatory Visit: Payer: Medicare PPO | Admitting: Radiation Oncology

## 2022-02-10 ENCOUNTER — Ambulatory Visit (INDEPENDENT_AMBULATORY_CARE_PROVIDER_SITE_OTHER): Payer: Medicare PPO

## 2022-02-10 ENCOUNTER — Encounter (INDEPENDENT_AMBULATORY_CARE_PROVIDER_SITE_OTHER): Payer: Self-pay | Admitting: Vascular Surgery

## 2022-02-10 ENCOUNTER — Ambulatory Visit (INDEPENDENT_AMBULATORY_CARE_PROVIDER_SITE_OTHER): Payer: Medicare PPO | Admitting: Vascular Surgery

## 2022-02-10 VITALS — BP 161/79 | HR 66 | Resp 18

## 2022-02-10 DIAGNOSIS — I150 Renovascular hypertension: Secondary | ICD-10-CM | POA: Diagnosis not present

## 2022-02-10 DIAGNOSIS — I701 Atherosclerosis of renal artery: Secondary | ICD-10-CM

## 2022-02-10 DIAGNOSIS — E7800 Pure hypercholesterolemia, unspecified: Secondary | ICD-10-CM

## 2022-02-10 NOTE — Assessment & Plan Note (Signed)
Her duplex today shows a patent right renal artery stent with a known atrophic left kidney.  She is doing well.  For solitary kidney we will continue to follow her on 78-monthintervals.

## 2022-02-10 NOTE — Progress Notes (Signed)
MRN : [id number]  Tina Curtis is a 88 y.o. ([birth year]) female who presents with chief complaint of No chief complaint on file. Marland Kitchen  History of Present Illness: Patient returns today in follow up of her renal artery stenosis.  Her blood pressure has been fine.  As far she knows her renal function is stable.  She is scheduled to see her nephrologist next month.  Her duplex today shows a patent right renal artery stent with a known atrophic left kidney.  Current Outpatient Medications  Medication Sig Dispense Refill   allopurinol (ZYLOPRIM) 100 MG tablet      aspirin 81 MG EC tablet Take by mouth.     Baclofen 5 MG TABS Take by mouth.     calcitRIOL (ROCALTROL) 0.25 MCG capsule Take by mouth.     citalopram (CELEXA) 10 MG tablet Take 10 mg by mouth daily.      clopidogrel (PLAVIX) 75 MG tablet TAKE 1 TABLET BY MOUTH EVERY DAY 30 tablet 1   cyanocobalamin (VITAMIN B12) 1000 MCG tablet Take by mouth.     docusate sodium (COLACE) 100 MG capsule Take 100 mg by mouth daily as needed for mild constipation.     DULoxetine (CYMBALTA) 60 MG capsule Take 60 mg by mouth daily.      ergocalciferol (VITAMIN D2) 1.25 MG (50000 UT) capsule Take 1 capsule by mouth once a week.     furosemide (LASIX) 20 MG tablet Take 20 mg by mouth daily.      gabapentin (NEURONTIN) 300 MG capsule Take 300 mg by mouth at bedtime.     loratadine (CLARITIN) 10 MG tablet Take 10 mg by mouth daily as needed for allergies. AM     metoprolol succinate (TOPROL-XL) 25 MG 24 hr tablet Take 37.5 mg by mouth at bedtime.      simvastatin (ZOCOR) 20 MG tablet Take 20 mg by mouth every evening. PM     anastrozole (ARIMIDEX) 1 MG tablet Take 1 mg by mouth daily.     No current facility-administered medications for this visit.    Past Medical History:  Diagnosis Date   Arthritis    Gout   Chronic kidney disease    Diarrhea    Hypercholesteremia    Hypertension    Neuropathy    feet and lower legs   Seasonal allergies     Skin cancer    face    Past Surgical History:  Procedure Laterality Date   ABDOMINAL HYSTERECTOMY  2000   APPENDECTOMY     BREAST BIOPSY Right 09/19/2019   affirm bx of calcs UOQ, x marker, path pending   BREAST BIOPSY Right 09/19/2019   [place] bx of mass,heart marker, path pending   BREAST BIOPSY Right 09/19/2019   [place] bx of LN, coil marker, path pending   CATARACT EXTRACTION Left    CATARACT EXTRACTION W/PHACO Right 10/24/2014   Procedure: CATARACT EXTRACTION PHACO AND INTRAOCULAR LENS PLACEMENT ([place]);  Surgeon: Leandrew Koyanagi, MD;  Location: [place];  Service: Ophthalmology;  Laterality: Right;   ESOPHAGOGASTRODUODENOSCOPY N/A 03/07/2018   Procedure: ESOPHAGOGASTRODUODENOSCOPY (EGD);  Surgeon: Lin Landsman, MD;  Location[facility] ENDOSCOPY;  Service: Gastroenterology;  Laterality: N/A;   MASTECTOMY MODIFIED RADICAL Right 10/13/2019   Procedure: MASTECTOMY MODIFIED RADICAL;  Surgeon: Robert Bellow, MD;  Location: [facility] ORS;  Service: General;  Laterality: Right;   RENAL ANGIOGRAPHY Right 04/11/2018   Procedure: RENAL ANGIOGRAPHY;  Surgeon: Algernon Huxley, MD;  Location: [place]  CV LAB;  Service: Cardiovascular;  Laterality: Right;   SIMPLE MASTECTOMY WITH AXILLARY SENTINEL NODE BIOPSY Left 10/13/2019   Procedure: SIMPLE MASTECTOMY TRUE CUT BIOPSY, SENTINEL NODE BIOPSY;  Surgeon: Robert Bellow, MD;  Location: [facility] ORS;  Service: General;  Laterality: Left;     Social History   Tobacco Use   Smoking status: Never   Smokeless tobacco: Never  Substance Use Topics   Alcohol use: No   Drug use: Never      Family History  Problem Relation Age of Onset   Hypertension Mother    Hypertension Father    Stroke Father    Breast cancer Daughter 44     Allergies  Allergen Reactions   Penicillins Anaphylaxis   Drug Ingredient [Zinc]     REVIEW OF SYSTEMS (Negative unless checked)   Constitutional: '[]'$ Weight loss  '[]'$ Fever  '[]'$ Chills Cardiac: '[]'$ Chest  pain   '[]'$ Chest pressure   '[]'$ Palpitations   '[]'$ Shortness of breath when laying flat   '[]'$ Shortness of breath at rest   '[]'$ Shortness of breath with exertion. Vascular:  '[]'$ Pain in legs with walking   '[]'$ Pain in legs at rest   '[]'$ Pain in legs when laying flat   '[]'$ Claudication   '[]'$ Pain in feet when walking  '[]'$ Pain in feet at rest  '[]'$ Pain in feet when laying flat   '[]'$ History of DVT   '[]'$ Phlebitis   '[]'$ Swelling in legs   '[]'$ Varicose veins   '[]'$ Non-healing ulcers Pulmonary:   '[]'$ Uses home oxygen   '[]'$ Productive cough   '[]'$ Hemoptysis   '[]'$ Wheeze  '[]'$ COPD   '[]'$ Asthma Neurologic:  '[]'$ Dizziness  '[]'$ Blackouts   '[]'$ Seizures   '[]'$ History of stroke   '[]'$ History of TIA  '[]'$ Aphasia   '[]'$ Temporary blindness   '[]'$ Dysphagia   '[]'$ Weakness or numbness in arms   '[x]'$ Weakness or numbness in legs Musculoskeletal:  '[x]'$ Arthritis   '[]'$ Joint swelling   '[x]'$ Joint pain   '[]'$ Low back pain Hematologic:  '[]'$ Easy bruising  '[]'$ Easy bleeding   '[]'$ Hypercoagulable state   '[]'$ Anemic  '[]'$ Hepatitis Gastrointestinal:  '[]'$ Blood in stool   '[]'$ Vomiting blood  '[]'$ Gastroesophageal reflux/heartburn   '[]'$ Abdominal pain Genitourinary:  '[x]'$ Chronic kidney disease   '[x]'$ Difficult urination  '[]'$ Frequent urination  '[]'$ Burning with urination   '[]'$ Hematuria Skin:  '[]'$ Rashes   '[]'$ Ulcers   '[]'$ Wounds Psychological:  '[]'$ History of anxiety   '[]'$  History of major depression.  Physical Examination  BP (!) 161/79 (BP Location: Left Arm)   Pulse 66   Resp 18  Gen:  WD/WN, NAD. Appears younger than stated age. Head: [place]/AT, No temporalis wasting. Ear/Nose/Throat: Hearing grossly intact, nares w/o erythema or drainage Eyes: Conjunctiva clear. Sclera non-icteric Neck: Supple.  Trachea midline Pulmonary:  Good air movement, no use of accessory muscles.  Cardiac: RRR, no JVD Vascular:  Vessel Right Left  Radial Palpable Palpable           Musculoskeletal: M/S 5/5 throughout.  No deformity or atrophy. No edema. Neurologic: Sensation grossly intact in extremities.  Symmetrical.  Speech is fluent.   Psychiatric: Judgment intact, Mood & affect appropriate for pt's clinical situation. Dermatologic: No rashes or ulcers noted.  No cellulitis or open wounds.      Labs Recent Results (from the past 2160 hour(s))  Cancer antigen 15-3     Status: Abnormal   Collection Time: 01/09/22 10:19 AM  Result Value Ref Range   CA 15-3 33.0 (H) 0.0 - 25.0 U/mL    Comment: (NOTE) Roche Diagnostics Electrochemiluminescence Immunoassay (ECLIA) Values obtained with different assay methods or kits cannot be used  interchangeably.  Results cannot be interpreted as absolute evidence of the presence or absence of malignant disease. Performed At: [facility] [place], [place] [id number] Rush Farmer MD [id number]   VITAMIN D 25 Hydroxy (Vit-D Deficiency, Fractures)     Status: None   Collection Time: 01/09/22 10:19 AM  Result Value Ref Range   Vit D, 25-Hydroxy 35.43 30 - 100 ng/mL    Comment: (NOTE) Vitamin D deficiency has been defined by the [place] practice guideline as a level of serum 25-OH  vitamin D less than 20 ng/mL (1,2). The Endocrine Society went on to  further define vitamin D insufficiency as a level between 21 and 29  ng/mL (2).  1. IOM (Institute of Medicine). 2010. Dietary reference intakes for  calcium and D. [place]: The [employer]. 2. Holick MF, [place], Bischoff-Ferrari HA, et al. Evaluation,  treatment, and prevention of vitamin D deficiency: an Endocrine  Society clinical practice guideline, JCEM. 2011 Jul; 96(7): 1911-30.  Performed at [facility], [place] [address], [place], [place] [address]   CEA     Status: Abnormal   Collection Time: 01/09/22 10:19 AM  Result Value Ref Range   CEA 5.4 (H) 0.0 - 4.7 ng/mL    Comment: (NOTE)                             Nonsmokers          <3.9                             Smokers             <5.6 Roche [employer]ostics  Electrochemiluminescence Immunoassay (ECLIA) Values obtained with different assay methods or kits cannot be used interchangeably.  Results cannot be interpreted as absolute evidence of the presence or absence of malignant disease. Performed At: [facility] [place], [place] [id number] Rush Farmer MD [id number]   Cancer antigen 27.29     Status: None   Collection Time: 01/09/22 10:19 AM  Result Value Ref Range   CA 27.29 27.6 0.0 - 38.6 U/mL    Comment: (NOTE) Siemens Centaur Immunochemiluminometric Methodology [facility]) Values obtained with different assay methods or kits cannot be used interchangeably. Results cannot be interpreted as absolute evidence of the presence or absence of malignant disease. Performed At: [facility] [place], [place] [id number] Rush Farmer MD IP:[id number]   Comprehensive metabolic panel     Status: Abnormal   Collection Time: 01/09/22 10:19 AM  Result Value Ref Range   Sodium 140 135 - 145 mmol/L   Potassium 3.7 3.5 - 5.1 mmol/L   Chloride 104 98 - 111 mmol/L   CO2 27 22 - 32 mmol/L   Glucose, Bld 167 (H) 70 - 99 mg/dL    Comment: Glucose reference range applies only to samples taken after fasting for at least 8 hours.   BUN 42 (H) 8 - 23 mg/dL   Creatinine, Ser 1.59 (H) 0.44 - 1.00 mg/dL   Calcium 9.5 8.9 - 10.3 mg/dL   Total Protein 7.9 6.5 - 8.1 g/dL   Albumin 4.1 3.5 - 5.0 g/dL   AST 29 15 - 41 U/L   ALT 19 0 - 44 U/L   Alkaline Phosphatase 83 38 - 126 U/L   Total Bilirubin 0.6  0.3 - 1.2 mg/dL   GFR, Estimated 30 (L) >60 mL/min    Comment: (NOTE) Calculated using the CKD-EPI Creatinine Equation (2021)    Anion gap 9 5 - 15    Comment: Performed at [facility], [address] [place], [place] [address]  CBC with Differential/Platelet     Status: None   Collection Time: 01/09/22 10:19 AM  Result Value Ref Range   WBC 6.3 4.0 - 10.5 K/uL   RBC 4.05 3.87 - 5.11 MIL/uL    Hemoglobin 13.1 12.0 - 15.0 g/dL   HCT 38.8 36.0 - 46.0 %   MCV 95.8 80.0 - 100.0 fL   MCH 32.3 26.0 - 34.0 pg   MCHC 33.8 30.0 - 36.0 g/dL   RDW 13.2 11.5 - 15.5 %   Platelets 151 150 - 400 K/uL   nRBC 0.0 0.0 - 0.2 %   Neutrophils Relative % 59 %   Neutro Abs 3.8 1.7 - 7.7 K/uL   Lymphocytes Relative 25 %   Lymphs Abs 1.6 0.7 - 4.0 K/uL   Monocytes Relative 9 %   Monocytes Absolute 0.6 0.1 - 1.0 K/uL   Eosinophils Relative 5 %   Eosinophils Absolute 0.3 0.0 - 0.5 K/uL   Basophils Relative 1 %   Basophils Absolute 0.1 0.0 - 0.1 K/uL   Immature Granulocytes 1 %   Abs Immature Granulocytes 0.03 0.00 - 0.07 K/uL    Comment: Performed at [facility], [address], [place], [place] [address]    Radiology No results found.  Assessment/Plan Pure hypercholesterolemia lipid control important in reducing the progression of atherosclerotic disease. Continue statin therapy   Renovascular hypertension Blood pressure control markedly improved after intervention  Renal artery stenosis (HCC) Her duplex today shows a patent right renal artery stent with a known atrophic left kidney.  She is doing well.  For solitary kidney we will continue to follow her on 40-monthintervals.    JLeotis Pain MD  02/10/2022 11:42 AM    This note was created with Dragon medical transcription system.  Any errors from dictation are purely unintentional

## 2022-02-18 ENCOUNTER — Encounter: Payer: Self-pay | Admitting: Radiation Oncology

## 2022-02-18 ENCOUNTER — Ambulatory Visit
Admission: RE | Admit: 2022-02-18 | Discharge: 2022-02-18 | Disposition: A | Discharge status: Home / Self Care | Payer: Medicare PPO | Source: Ambulatory Visit | Attending: Radiation Oncology | Admitting: Radiation Oncology

## 2022-02-18 VITALS — BP 153/78 | HR 43 | Temp 98.7°F | Resp 16 | Wt 139.0 lb

## 2022-02-18 DIAGNOSIS — Z9013 Acquired absence of bilateral breasts and nipples: Secondary | ICD-10-CM | POA: Diagnosis not present

## 2022-02-18 DIAGNOSIS — C773 Secondary and unspecified malignant neoplasm of axilla and upper limb lymph nodes: Secondary | ICD-10-CM | POA: Insufficient documentation

## 2022-02-18 DIAGNOSIS — C50411 Malignant neoplasm of upper-outer quadrant of right female breast: Secondary | ICD-10-CM | POA: Insufficient documentation

## 2022-02-18 DIAGNOSIS — Z170 Estrogen receptor positive status [ER+]: Secondary | ICD-10-CM | POA: Diagnosis not present

## 2022-02-18 DIAGNOSIS — Z79811 Long term (current) use of aromatase inhibitors: Secondary | ICD-10-CM | POA: Diagnosis not present

## 2022-02-18 DIAGNOSIS — C50911 Malignant neoplasm of unspecified site of right female breast: Secondary | ICD-10-CM

## 2022-02-18 DIAGNOSIS — C50412 Malignant neoplasm of upper-outer quadrant of left female breast: Secondary | ICD-10-CM | POA: Insufficient documentation

## 2022-02-18 NOTE — Progress Notes (Signed)
Radiation Oncology Follow up Note  Name: Tina Curtis   Date:   02/18/2022 MRN:  [id number] [name]: 08-13-1926    This 88 y.o. 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  Seen today in routine follow-up she is doing well specifically denies any chest wall pain nodularity or masses she has some skin changes which appear age-related.  She has no lymphedema in her upper extremities.  No follow-up mammograms have been performed and she is status post bilateral mastectomies..  She is currently on Arimidex tolerating it well without side effect  COMPLICATIONS OF TREATMENT: none  FOLLOW UP COMPLIANCE: keeps appointments   PHYSICAL EXAM:  BP (!) 153/78 (BP Location: Right Arm, Patient Position: Sitting, Cuff Size: Normal)   Pulse (!) 43   Temp 98.7 F (37.1 C) (Tympanic)   Resp 16   Wt 139 lb (63 kg)   BMI 23.86 kg/m elderly female wheelchair-bound in NAD Patient is status post bilateral mastectomies.  No evidence of chest wall mass or nodularity is noted no lymphedema is noted in either upper extremity.  Well-developed well-nourished patient in NAD. HEENT reveals PERLA, EOMI, discs not visualized.  Oral cavity is clear. No oral mucosal lesions are identified. Neck is clear without evidence of cervical or supraclavicular adenopathy. Lungs are clear to A&P. Cardiac examination is essentially unremarkable with regular rate and rhythm without murmur rub or thrill. Abdomen is benign with no organomegaly or masses noted. Motor sensory and DTR levels are equal and  symmetric in the upper and lower extremities. Cranial nerves II through XII are grossly intact. Proprioception is intact. No peripheral adenopathy or edema is identified. No motor or sensory levels are noted. Crude visual fields are within normal range.  RADIOLOGY RESULTS: No current films for review  PLAN: Time based on the patient's age as she has bilateral mastectomies not necessitating breast examination I will turn follow-up care over to medical oncology.  I be happy to reevaluate the patient anytime should that be indicated.  Patient knows to call with any concerns at any time.  I would like to take this opportunity to thank you for allowing me to participate in the care of your patient.Noreene Filbert, MD

## 2022-03-03 ENCOUNTER — Encounter (INDEPENDENT_AMBULATORY_CARE_PROVIDER_SITE_OTHER): Payer: Medicare PPO

## 2022-03-03 ENCOUNTER — Ambulatory Visit (INDEPENDENT_AMBULATORY_CARE_PROVIDER_SITE_OTHER): Payer: Medicare PPO | Admitting: Vascular Surgery

## 2022-03-18 ENCOUNTER — Other Ambulatory Visit: Payer: Self-pay

## 2022-03-18 ENCOUNTER — Emergency Department
Admission: EM | Admit: 2022-03-18 | Discharge: 2022-03-18 | Disposition: A | Discharge status: Home / Self Care | Payer: Medicare PPO | Attending: Emergency Medicine | Admitting: Emergency Medicine

## 2022-03-18 ENCOUNTER — Ambulatory Visit
Admission: EM | Admit: 2022-03-18 | Discharge: 2022-03-18 | Disposition: A | Discharge status: Home / Self Care | Payer: Medicare PPO | Attending: Emergency Medicine | Admitting: Emergency Medicine

## 2022-03-18 ENCOUNTER — Emergency Department: Payer: Medicare PPO

## 2022-03-18 ENCOUNTER — Encounter: Payer: Self-pay | Admitting: Emergency Medicine

## 2022-03-18 DIAGNOSIS — I160 Hypertensive urgency: Secondary | ICD-10-CM | POA: Diagnosis not present

## 2022-03-18 DIAGNOSIS — Z1152 Encounter for screening for COVID-19: Secondary | ICD-10-CM | POA: Insufficient documentation

## 2022-03-18 DIAGNOSIS — R1084 Generalized abdominal pain: Secondary | ICD-10-CM | POA: Diagnosis present

## 2022-03-18 DIAGNOSIS — R350 Frequency of micturition: Secondary | ICD-10-CM

## 2022-03-18 DIAGNOSIS — N189 Chronic kidney disease, unspecified: Secondary | ICD-10-CM | POA: Diagnosis not present

## 2022-03-18 DIAGNOSIS — R42 Dizziness and giddiness: Secondary | ICD-10-CM | POA: Diagnosis not present

## 2022-03-18 DIAGNOSIS — I129 Hypertensive chronic kidney disease with stage 1 through stage 4 chronic kidney disease, or unspecified chronic kidney disease: Secondary | ICD-10-CM | POA: Insufficient documentation

## 2022-03-18 DIAGNOSIS — I10 Essential (primary) hypertension: Secondary | ICD-10-CM

## 2022-03-18 DIAGNOSIS — Z853 Personal history of malignant neoplasm of breast: Secondary | ICD-10-CM | POA: Diagnosis not present

## 2022-03-18 DIAGNOSIS — R519 Headache, unspecified: Secondary | ICD-10-CM | POA: Diagnosis not present

## 2022-03-18 LAB — COMPREHENSIVE METABOLIC PANEL
ALT: 20 U/L (ref 0–44)
AST: 29 U/L (ref 15–41)
Albumin: 4.2 g/dL (ref 3.5–5.0)
Alkaline Phosphatase: 68 U/L (ref 38–126)
Anion gap: 9 (ref 5–15)
BUN: 52 mg/dL — ABNORMAL HIGH (ref 8–23)
CO2: 24 mmol/L (ref 22–32)
Calcium: 9.8 mg/dL (ref 8.9–10.3)
Chloride: 104 mmol/L (ref 98–111)
Creatinine, Ser: 1.42 mg/dL — ABNORMAL HIGH (ref 0.44–1.00)
GFR, Estimated: 35 mL/min — ABNORMAL LOW (ref >60)
Glucose, Bld: 113 mg/dL — ABNORMAL HIGH (ref 70–99)
Potassium: 3.7 mmol/L (ref 3.5–5.1)
Sodium: 137 mmol/L (ref 135–145)
Total Bilirubin: 0.8 mg/dL (ref 0.3–1.2)
Total Protein: 7.6 g/dL (ref 6.5–8.1)

## 2022-03-18 LAB — URINALYSIS, ROUTINE W REFLEX MICROSCOPIC
Bacteria, UA: NONE SEEN
Bilirubin Urine: NEGATIVE
Glucose, UA: NEGATIVE mg/dL
Hgb urine dipstick: NEGATIVE
Ketones, ur: NEGATIVE mg/dL
Nitrite: NEGATIVE
Protein, ur: NEGATIVE mg/dL
Specific Gravity, Urine: 1.015 (ref 1.005–1.030)
pH: 5 (ref 5.0–8.0)

## 2022-03-18 LAB — POCT URINALYSIS DIP (MANUAL ENTRY)
Bilirubin, UA: NEGATIVE
Blood, UA: NEGATIVE
Glucose, UA: NEGATIVE mg/dL
Ketones, POC UA: NEGATIVE mg/dL
Nitrite, UA: NEGATIVE
Protein Ur, POC: NEGATIVE mg/dL
Spec Grav, UA: 1.01 (ref 1.010–1.025)
Urobilinogen, UA: 0.2 E.U./dL
pH, UA: 5.5 (ref 5.0–8.0)

## 2022-03-18 LAB — SARS CORONAVIRUS 2 BY RT PCR: SARS Coronavirus 2 by RT PCR: NEGATIVE

## 2022-03-18 LAB — CBC WITH DIFFERENTIAL/PLATELET
Abs Immature Granulocytes: 0.04 10*3/uL (ref 0.00–0.07)
Basophils Absolute: 0.1 10*3/uL (ref 0.0–0.1)
Basophils Relative: 1 %
Eosinophils Absolute: 0.1 10*3/uL (ref 0.0–0.5)
Eosinophils Relative: 1 %
HCT: 37.6 % (ref 36.0–46.0)
Hemoglobin: 12.6 g/dL (ref 12.0–15.0)
Immature Granulocytes: 0 %
Lymphocytes Relative: 13 %
Lymphs Abs: 1.4 10*3/uL (ref 0.7–4.0)
MCH: 31 pg (ref 26.0–34.0)
MCHC: 33.5 g/dL (ref 30.0–36.0)
MCV: 92.6 fL (ref 80.0–100.0)
Monocytes Absolute: 0.8 10*3/uL (ref 0.1–1.0)
Monocytes Relative: 8 %
Neutro Abs: 8.1 10*3/uL — ABNORMAL HIGH (ref 1.7–7.7)
Neutrophils Relative %: 77 %
Platelets: 146 10*3/uL — ABNORMAL LOW (ref 150–400)
RBC: 4.06 MIL/uL (ref 3.87–5.11)
RDW: 13.6 % (ref 11.5–15.5)
WBC: 10.5 10*3/uL (ref 4.0–10.5)
nRBC: 0 % (ref 0.0–0.2)

## 2022-03-18 LAB — TROPONIN I (HIGH SENSITIVITY)
Troponin I (High Sensitivity): 16 ng/L (ref <18)
Troponin I (High Sensitivity): 16 ng/L (ref <18)

## 2022-03-18 LAB — LIPASE, BLOOD: Lipase: 58 U/L — ABNORMAL HIGH (ref 11–51)

## 2022-03-18 MED ORDER — IOHEXOL 300 MG/ML  SOLN
60.0000 mL | Freq: Once | INTRAMUSCULAR | Status: AC | PRN
  Administered 2022-03-18: 60 mL via INTRAVENOUS

## 2022-03-18 NOTE — ED Provider Notes (Signed)
[facility] Provider Note    Event Date/Time   First MD Initiated Contact with Patient 03/18/22 1726     (approximate)   History   Hypertension   HPI  Tina Curtis is a 88 y.o. female with a history of renal artery stenosis, CKD, hypertension, breast cancer who comes in with concerns for abdominal discomfort.  Patient reports having dizziness and headaches for years now but she reported over the past week and a half she has had worsening dizziness as well as a little bit of a headache.  She also reports having some generalized abdominal discomfort but mostly in the right lower quadrant.  She reports a low-grade temperature of 99.  She reports having a history of sepsis from UTI reports some increased urinary frequency and felt like she might have a UTI which is what made her present to an urgent care.  However there her urine was negative and patient sent over here for further evaluation.  She is present today with her daughter who reports that she just does not seem like she is acting her normal self   Physical Exam   Triage Vital Signs: ED Triage Vitals  Enc Vitals Group     BP 03/18/22 1643 (!) 184/81     Pulse Rate 03/18/22 1643 86     Resp 03/18/22 1643 16     Temp 03/18/22 1643 98.9 F (37.2 C)     Temp src --      SpO2 03/18/22 1643 97 %     Weight --      Height --      Head Circumference --      Peak Flow --      Pain Score 03/18/22 1646 4     Pain Loc --      Pain Edu? --      Excl. in [place]? --     Most recent vital signs: Vitals:   03/18/22 1643  BP: (!) 184/81  Pulse: 86  Resp: 16  Temp: 98.9 F (37.2 C)  SpO2: 97%     General: Awake, no distress.  CV:  Good peripheral perfusion.  Resp:  Normal effort.  Abd:  No distention.  Tender in her abdomen. Other:  Current nerves II to XII are intact.  Equal strength in arms and legs.  Finger-nose intact bilaterally.   ED Results / Procedures / Treatments   Labs (all labs  ordered are listed, but only abnormal results are displayed) Labs Reviewed  CBC WITH DIFFERENTIAL/PLATELET  COMPREHENSIVE METABOLIC PANEL  URINALYSIS, ROUTINE W REFLEX MICROSCOPIC  LIPASE, BLOOD  TROPONIN I (HIGH SENSITIVITY)     EKG  My interpretation of EKG:  Sinus with a rate of 72 without any ST elevation or T wave inversions, normal intervals  RADIOLOGY I have reviewed the xray personally and interpreted and patient has no pneumonia  PROCEDURES:  Critical Care performed: No  .1-3 Lead EKG Interpretation  Performed by: Vanessa Niarada, MD Authorized by: Vanessa Mineola, MD     Interpretation: normal     ECG rate:  60   ECG rate assessment: normal     Rhythm: sinus rhythm     Ectopy: none     Conduction: normal      MEDICATIONS ORDERED IN ED: Medications - No data to display   IMPRESSION / MDM / [place] / ED COURSE  I reviewed the triage vital signs and the nursing notes.  Patient's presentation is most consistent with acute presentation with potential threat to life or bodily function.   Patient comes in with abdominal discomfort, dizziness and elevated blood pressure.  On cranial nerves she appears intact and symptoms of been going on greater than 24 hours therefore stroke code was not called.  Labs ordered evaluate for any Electra abnormalities, AKI, UTI, COVID.  Will get CT imaging to evaluate her head for any intracranial hemorrhage or abdomen for any other acute pathology.  Lipase was slightly elevated but similar to prior.  Her CBC was reassuring with a slight left shift.  CMP showed slight elevation of creatinine but downtrending from prior troponin was negative  Patient's urine without evidence of UTI does have some trace leukocytes with 6-10 WBCs given some urinary frequency we will send for urine culture  Her CT imaging did show some concerns for some distended urinary bladder with some mild right hydro-.  Patient did have a very large  urination after the CT scan and postvoid bladder scan was 100 cc which I did confirm with my own bedside ultrasound as well.  I also did discuss the incidental findings noted which they are aware of.  CT head was negative.  Patient has been ambulatory around the room repeat blood pressure is 166/72 we discussed holding off on trying to lower it she reports feeling better and we discussed MRI to rule out any type of stroke or issues with her blood pressure such as PR ES SS but patient reports that this dizziness that she has is similar to what she has had for years and that she has been seen by neurology in the past and gotten MRIs for it and that her biggest concern was that she could just have a UTI given the frequent urination so we will hold off on MRI per patient preference and patient if amatory around the room will discharge home.   Her urine from earlier today only showed some trace leukocytes but negative whites.  Her urine culture here did show 5-10 whites.  I had extensive discussion with both patient and the daughter about sending a urine culture here to see if it grows anything.  She currently denies any issues with her urination she did have some increased frequency earlier but now she feels like it is back to normal.  We discussed antibiotics but she reports having a lot of issues with antibiotics causing anaphylaxis and with her kidney function admitted make it a little bit difficult.  We will antibiotics that she is really tolerated the ciprofloxacin but with her age we discussed the risk of that.  We also discussed doing a dose of fosfomycin but she is never had this before and we discussed that if she did have an infection up higher into the kidney which I doubt given no CVA tenderness but there was some mild hydro noted on the CT that it would not cover this.  After extensive discussion family would like to hold off on antibiotics and we will call them if urine culture is positive.  I  considered admission given patient's dizziness but patient reports feeling much better and is requesting discharge home.  She will follow-up with her primary care doctor for recheck of her blood pressure.  The patient is on the cardiac monitor to evaluate for evidence of arrhythmia and/or significant heart rate changes.      FINAL CLINICAL IMPRESSION(S) / ED DIAGNOSES   Final diagnoses:  Hypertension, unspecified type  Rx / DC Orders   ED Discharge Orders     None        Note:  This document was prepared using Dragon voice recognition software and may include unintentional dictation errors.   Vanessa [place], MD 03/18/22 2130

## 2022-03-18 NOTE — Discharge Instructions (Signed)
Go to the emergency department for evaluation of your very high blood pressure, dizziness, frequent urination, and generalized abdominal pain.

## 2022-03-18 NOTE — ED Triage Notes (Signed)
Pt presents from urgent care for hypertension, headache, dizziness, and nausea.  Also reports abdominal tenderness and urinating more frequently.  Pt was sent here for eval.

## 2022-03-18 NOTE — ED Triage Notes (Signed)
Patient to Urgent Care with complaints of urinary frequency and nausea that started today. Reports using the bathroom multiple times today, having some abdominal pain.   Reports dizziness when moving round. Generalized abdominal tenderness.   Patient ambulatory from the bathroom prior to triage- assisted into a wheelchair due to dizziness. BP taken at 192/82.

## 2022-03-18 NOTE — Discharge Instructions (Addendum)
Your work-up was reassuring.  Your urine had a potential for UTI but we discussed antibiotics and but opted to hold off given there is no signs of any sepsis we are going to wait for urine culture.  We will call you if it is positive.  Return to the ER if develop fevers worsening symptoms or any other concerns  IMPRESSION: 1. Mild right hydronephrosis and prominence of the right ureter but no obstructing stone. Distended urinary bladde  I given you the number for urologist to follow-up due to the concern for the mild hydronephrosis noted on your CT scan.  You were able to get your urine out today but they may want to do additional bladder scans to ensure that you are fully emptying your bladder.  Try to urinate frequently throughout the days to prevent your bladder from building up.

## 2022-03-18 NOTE — ED Provider Notes (Signed)
[name]    CSN: [id number] Arrival date & time: 03/18/22  1602      History   Chief Complaint Chief Complaint  Patient presents with   Dysuria    HPI Tina Curtis is a 88 y.o. female. Accompanied by her daughter, patient presents with chills, dizziness, and urinary frequency today.  She also has nausea and generalized mild abdominal discomfort.  2 episodes of loose stool today.  She denies chest pain, shortness of breath, focal weakness, hematuria, vomiting, or other symptoms.  Her medical history includes renal artery stenosis, CKD, hypertension, breast cancer.   The history is provided by the patient, medical records and a relative.    Past Medical History:  Diagnosis Date   Arthritis    Gout   Chronic kidney disease    Diarrhea    Hypercholesteremia    Hypertension    Neuropathy    feet and lower legs   Seasonal allergies    Skin cancer    face    Patient Active Problem List   Diagnosis Date Noted   Osteoporosis of forearm 03/28/2020   Breast cancer ([place]) 10/13/2019   Carcinoma of upper-inner quadrant of right breast in female, estrogen receptor positive ([place]) 09/27/2019   Anemia in chronic kidney disease 02/01/2019   Atherosclerosis of renal artery ([place]) 02/01/2019   Benign hypertensive kidney disease with chronic kidney disease 02/01/2019   Hyposmolality and/or hyponatremia 02/01/2019   Proteinuria 02/01/2019   Secondary hyperparathyroidism of renal origin ([place]) 02/01/2019   Chronic kidney disease, stage IV (severe) ([place]) 04/01/2018   Renovascular hypertension 04/01/2018   Renal artery stenosis ([facility]) 04/01/2018   Nausea & vomiting 03/05/2018   AKI (acute kidney injury) ([place]) 02/17/2018   Acquired hammer toe of right foot 08/14/2014   Bunion of right foot 08/14/2014   Gout 08/14/2014   History of Clostridium difficile colitis 08/14/2014   Pure hypercholesterolemia 08/14/2014   RLS (restless legs syndrome) 08/14/2014   Hallux rigidus of  right foot 04/24/2014   Primary osteoarthritis of foot 09/01/2013   Fatigue 09/30/2012   Gluteus medius or minimus syndrome 08/23/2012   Trochanteric bursitis 08/12/2012   Benign essential hypertension 09/03/2011   Diverticulosis of colon 09/03/2011   Osteopenia 09/03/2011   Enthesopathy of ankle and tarsus 04/13/2011   Osteoarthritis of spine with radiculopathy, lumbosacral region 09/30/2010   Allergic rhinitis 10/31/1928    Past Surgical History:  Procedure Laterality Date   ABDOMINAL HYSTERECTOMY  2000   APPENDECTOMY     BREAST BIOPSY Right 09/19/2019   affirm bx of calcs UOQ, x marker, path pending   BREAST BIOPSY Right 09/19/2019   [place] bx of mass,heart marker, path pending   BREAST BIOPSY Right 09/19/2019   [place] bx of LN, coil marker, path pending   CATARACT EXTRACTION Left    CATARACT EXTRACTION W/PHACO Right 10/24/2014   Procedure: CATARACT EXTRACTION PHACO AND INTRAOCULAR LENS PLACEMENT ([place]);  Surgeon: Leandrew Koyanagi, MD;  Location: [facility];  Service: Ophthalmology;  Laterality: Right;   ESOPHAGOGASTRODUODENOSCOPY N/A 03/07/2018   Procedure: ESOPHAGOGASTRODUODENOSCOPY (EGD);  Surgeon: Lin Landsman, MD;  [facility] END[facility];  Service: Gastroenterology;  Laterality: N/A;   MASTECTOMY MODIFIED RADICAL Right 10/13/2019   Procedure: MASTECTOMY MODIFIED RADICAL;  Surgeon: Robert Bellow, MD;  Location: [facility] ORS;  Service: General;  Laterality: Right;   RENAL ANGIOGRAPHY Right 04/11/2018   Procedure: RENAL ANGIOGRAPHY;  Surgeon: Algernon Huxley, MD;  Location: [facility] CV LAB;  Service: Cardiovascular;  Laterality:  Right;   SIMPLE MASTECTOMY WITH AXILLARY SENTINEL NODE BIOPSY Left 10/13/2019   Procedure: SIMPLE MASTECTOMY TRUE CUT BIOPSY, SENTINEL NODE BIOPSY;  Surgeon: Robert Bellow, MD;  Location: [facility] ORS;  Service: General;  Laterality: Left;    OB History   No obstetric history on file.      Home Medications    Prior to Admission  medications   Medication Sig Start Date End Date Taking? Authorizing Provider  allopurinol (ZYLOPRIM) 100 MG tablet  01/16/20   [provider]  anastrozole (ARIMIDEX) 1 MG tablet Take 1 mg by mouth daily. 11/04/21   [provider]  aspirin 81 MG EC tablet Take by mouth. 10/24/20   [provider]  Baclofen 5 MG TABS Take by mouth. 09/19/21 03/18/22  [provider]  calcitRIOL (ROCALTROL) 0.25 MCG capsule Take by mouth. 08/20/21   [provider]  citalopram (CELEXA) 10 MG tablet Take 10 mg by mouth daily.  03/14/18   [provider]  clopidogrel (PLAVIX) 75 MG tablet TAKE 1 TABLET BY MOUTH EVERY DAY 05/16/19   Algernon Huxley, MD  cyanocobalamin (VITAMIN B12) 1000 MCG tablet Take by mouth.    [provider]  docusate sodium (COLACE) 100 MG capsule Take 100 mg by mouth daily as needed for mild constipation.    [provider]  DULoxetine (CYMBALTA) 60 MG capsule Take 60 mg by mouth daily.  11/11/18   [provider]  ergocalciferol (VITAMIN D2) 1.25 MG (50000 UT) capsule Take 1 capsule by mouth once a week. 01/09/15   [provider]  furosemide (LASIX) 20 MG tablet Take 20 mg by mouth daily.  05/19/18   [provider]  gabapentin (NEURONTIN) 300 MG capsule Take 300 mg by mouth at bedtime.    [provider]  loratadine (CLARITIN) 10 MG tablet Take 10 mg by mouth daily as needed for allergies. AM    [provider]  metoprolol succinate (TOPROL-XL) 25 MG 24 hr tablet Take 37.5 mg by mouth at bedtime.     [provider]  simvastatin (ZOCOR) 20 MG tablet Take 20 mg by mouth every evening. PM    [provider]    Family History Family History  Problem Relation Age of Onset   Hypertension Mother    Hypertension Father    Stroke Father    Breast cancer Daughter 65    Social History Social History   Tobacco Use   Smoking status: Never   Smokeless tobacco: Never   Substance Use Topics   Alcohol use: No   Drug use: Never     Allergies   Penicillins and Drug ingredient [zinc]   Review of Systems Review of Systems  Constitutional:  Negative for chills and fever.  Respiratory:  Negative for cough and shortness of breath.   Cardiovascular:  Negative for chest pain and palpitations.  Gastrointestinal:  Positive for abdominal pain, diarrhea and nausea. Negative for vomiting.  Genitourinary:  Positive for frequency. Negative for dysuria and hematuria.  Neurological:  Positive for dizziness. Negative for syncope, weakness and numbness.  All other systems reviewed and are negative.    Physical Exam Triage Vital Signs ED Triage Vitals  Enc Vitals Group     BP      Pulse      Resp      Temp      Temp src      SpO2      Weight  Height      Head Circumference      Peak Flow      Pain Score      Pain Loc      Pain Edu?      Excl. in [place]?    No data found.  Updated Vital Signs BP (!) 177/83   Pulse 82   Temp 99.1 F (37.3 C)   Resp 18   Ht '5\' 4"'$  (1.626 m)   SpO2 95%   BMI 23.86 kg/m   Visual Acuity Right Eye Distance:   Left Eye Distance:   Bilateral Distance:    Right Eye Near:   Left Eye Near:    Bilateral Near:     Physical Exam Vitals and nursing note reviewed.  Constitutional:      General: She is not in acute distress.    Appearance: She is well-developed. She is ill-appearing.  HENT:     Mouth/Throat:     Mouth: Mucous membranes are moist.  Cardiovascular:     Rate and Rhythm: Normal rate and regular rhythm.     Heart sounds: Normal heart sounds.  Pulmonary:     Effort: Pulmonary effort is normal. No respiratory distress.     Breath sounds: Normal breath sounds.  Abdominal:     General: Bowel sounds are normal.     Palpations: Abdomen is soft.     Tenderness: There is abdominal tenderness. There is no guarding or rebound.     Comments: Mild generalized tenderness to palpation.   Musculoskeletal:      Cervical back: Neck supple.  Skin:    General: Skin is warm and dry.  Neurological:     General: No focal deficit present.     Mental Status: She is alert.     Sensory: No sensory deficit.     Motor: No weakness.  Psychiatric:        Mood and Affect: Mood normal.        Behavior: Behavior normal.      UC Treatments / Results  Labs (all labs ordered are listed, but only abnormal results are displayed) Labs Reviewed  POCT URINALYSIS DIP (MANUAL ENTRY) - Abnormal; Notable for the following components:      Result Value   Leukocytes, UA Trace (*)    All other components within normal limits    EKG   Radiology No results found.  Procedures Procedures (including critical care time)  Medications Ordered in UC Medications - No data to display  Initial Impression / Assessment and Plan / UC Course  I have reviewed the triage vital signs and the nursing notes.  Pertinent labs & imaging results that were available during my care of the patient were reviewed by me and considered in my medical decision making (see chart for details).   Hypertensive urgency, dizziness, urinary frequency, generalized abdominal pain.  Patient is here with her daughter.  She has acute dizziness and blood pressure 192/82.  Repeat blood pressure 177/83.  Patient is ill-appearing.  Discussed limitations of evaluation of her symptoms in an urgent care and need for higher level of care.  After discussion, patient agrees to go to the ED.  Her daughter is agreeable to this.  They decline EMS.  Her daughter will drive her to the ED.     Final Clinical Impressions(s) / UC Diagnoses   Final diagnoses:  Hypertensive urgency  Dizziness  Urinary frequency  Generalized abdominal pain     Discharge Instructions  Go to the emergency department for evaluation of your very high blood pressure, dizziness, frequent urination, and generalized abdominal pain.       ED Prescriptions   None    PDMP not  reviewed this encounter.   Sharion Balloon, NP 03/18/22 [phone]

## 2022-03-20 LAB — URINE CULTURE

## 2022-04-08 ENCOUNTER — Ambulatory Visit: Payer: Medicare PPO | Admitting: Podiatry

## 2022-04-08 ENCOUNTER — Encounter: Payer: Self-pay | Admitting: Podiatry

## 2022-04-08 DIAGNOSIS — D689 Coagulation defect, unspecified: Secondary | ICD-10-CM | POA: Diagnosis not present

## 2022-04-08 DIAGNOSIS — M79674 Pain in right toe(s): Secondary | ICD-10-CM | POA: Diagnosis not present

## 2022-04-08 DIAGNOSIS — B351 Tinea unguium: Secondary | ICD-10-CM

## 2022-04-08 DIAGNOSIS — D2372 Other benign neoplasm of skin of left lower limb, including hip: Secondary | ICD-10-CM

## 2022-04-08 DIAGNOSIS — D2371 Other benign neoplasm of skin of right lower limb, including hip: Secondary | ICD-10-CM

## 2022-04-08 DIAGNOSIS — M79675 Pain in left toe(s): Secondary | ICD-10-CM | POA: Diagnosis not present

## 2022-04-08 NOTE — Progress Notes (Signed)
She presents today concerned about her painful elongated toenails and her calluses.  Objective: Vital signs are stable alert and oriented x 3.  Pulses are palpable.  No open lesions or wounds.  Multiple benign skin lesions plantar aspect of the bilateral foot toenails are long thick yellow dystrophic-like mycotic.  Assessment: Pain in limb secondary to onychomycosis hammertoe deformities and benign skin lesions.  Plan: Debridement of benign skin lesions debridement of nails 1 through 5 bilaterally.  Will follow-up with her on an as-needed basis.

## 2022-04-10 ENCOUNTER — Encounter: Payer: Self-pay | Admitting: Internal Medicine

## 2022-04-21 ENCOUNTER — Telehealth (INDEPENDENT_AMBULATORY_CARE_PROVIDER_SITE_OTHER): Payer: Self-pay | Admitting: Vascular Surgery

## 2022-04-21 NOTE — Telephone Encounter (Signed)
Patient called and stated that Dr. Malva Cogan from [facility] stated he was going to contact Dr. Lucky Cowboy regarding Patient's results.  Please advise

## 2022-04-22 NOTE — Telephone Encounter (Signed)
Patient stated that she will contact her provider office

## 2022-04-30 ENCOUNTER — Encounter (INDEPENDENT_AMBULATORY_CARE_PROVIDER_SITE_OTHER): Payer: Self-pay | Admitting: Nurse Practitioner

## 2022-04-30 ENCOUNTER — Ambulatory Visit (INDEPENDENT_AMBULATORY_CARE_PROVIDER_SITE_OTHER): Payer: Medicare PPO | Admitting: Nurse Practitioner

## 2022-04-30 ENCOUNTER — Ambulatory Visit (INDEPENDENT_AMBULATORY_CARE_PROVIDER_SITE_OTHER): Payer: Medicare PPO

## 2022-04-30 VITALS — BP 175/82 | HR 72 | Resp 17 | Ht 63.0 in | Wt 139.0 lb

## 2022-04-30 DIAGNOSIS — I701 Atherosclerosis of renal artery: Secondary | ICD-10-CM | POA: Diagnosis not present

## 2022-04-30 DIAGNOSIS — I150 Renovascular hypertension: Secondary | ICD-10-CM

## 2022-04-30 DIAGNOSIS — R42 Dizziness and giddiness: Secondary | ICD-10-CM | POA: Diagnosis not present

## 2022-04-30 DIAGNOSIS — I10 Essential (primary) hypertension: Secondary | ICD-10-CM

## 2022-05-12 ENCOUNTER — Inpatient Hospital Stay: Payer: Medicare PPO

## 2022-05-12 ENCOUNTER — Inpatient Hospital Stay: Payer: Medicare PPO | Admitting: Internal Medicine

## 2022-05-12 ENCOUNTER — Encounter (INDEPENDENT_AMBULATORY_CARE_PROVIDER_SITE_OTHER): Payer: Self-pay | Admitting: Nurse Practitioner

## 2022-05-12 NOTE — Progress Notes (Signed)
Subjective:    Patient ID: Tina Curtis, female    [name]: 1927-01-22, 89 y.o.   MRN: [id number] Chief Complaint  Patient presents with   Follow-up    ultrasound    Tina Curtis is a 89 year old female who presents today for reevaluation of her renal artery stenosis.  The patient had a recent hypertensive urgency episode and she is referred back by her nephrologist for reevaluation of her known renal artery stenosis.  The patient most recently had this evaluated on 02/10/2022.  At that time there was no evidence of significant stenosis.  Today the patient's noninvasive studies show velocities that are very much the same if not improved from her previous studies.  She also has a maximal aortic diameter of 3.81 cm.  She does note some occasional dizziness and had a recent stroke    Review of Systems  Neurological:  Positive for weakness.  All other systems reviewed and are negative.      Objective:   Physical Exam Vitals reviewed.  HENT:     Head: Normocephalic.  Cardiovascular:     Rate and Rhythm: Normal rate.  Pulmonary:     Effort: Pulmonary effort is normal.  Skin:    General: Skin is warm and dry.  Neurological:     Mental Status: She is alert and oriented to person, place, and time.  Psychiatric:        Mood and Affect: Mood normal.        Behavior: Behavior normal.        Thought Content: Thought content normal.        Judgment: Judgment normal.     BP (!) 175/82 (BP Location: Left Arm)   Pulse 72   Resp 17   Ht '5\' 3"'$  (1.6 m)   Wt 139 lb (63 kg)   BMI 24.62 kg/m   Past Medical History:  Diagnosis Date   Arthritis    Gout   Chronic kidney disease    Diarrhea    Hypercholesteremia    Hypertension    Neuropathy    feet and lower legs   Seasonal allergies    Skin cancer    face    Social History   Socioeconomic History   Marital status: Widowed    Spouse name: Not on file   Number of children: Not on file   Years of education: Not on file    Highest education level: Not on file  Occupational History   Not on file  Tobacco Use   Smoking status: Never   Smokeless tobacco: Never  Substance and Sexual Activity   Alcohol use: No   Drug use: Never   Sexual activity: Not on file  Other Topics Concern   Not on file  Social History Narrative   2 daughters; lives in [place]; Pharmacist, 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    Social Determinants of Health   Financial Resource Strain: Not on file  Food Insecurity: Not on file  Transportation Needs: Not on file  Physical Activity: Not on file  Stress: Not on file  Social Connections: Not on file  Intimate Partner Violence: Not on file    Past Surgical History:  Procedure Laterality Date   ABDOMINAL HYSTERECTOMY  2000   APPENDECTOMY     BREAST BIOPSY Right 09/19/2019   affirm bx of calcs UOQ, x marker, path pending   BREAST BIOPSY Right 09/19/2019   [place] bx of mass,heart marker, path pending  BREAST BIOPSY Right 09/19/2019   [place] bx of LN, coil marker, path pending   CATARACT EXTRACTION Left    CATARACT EXTRACTION W/PHACO Right 10/24/2014   Procedure: CATARACT EXTRACTION PHACO AND INTRAOCULAR LENS PLACEMENT ([place]);  Surgeon: Leandrew Koyanagi, MD;  Location: [place];  Service: Ophthalmology;  Laterality: Right;   ESOPHAGOGASTRODUODENOSCOPY N/A 03/07/2018   Procedure: ESOPHAGOGASTRODUODENOSCOPY (EGD);  Surgeon: Lin Landsman, MD;  Location: [facility] ENDOSCOPY;  Service: Gastroenterology;  Laterality: N/A;   MASTECTOMY MODIFIED RADICAL Right 10/13/2019   Procedure: MASTECTOMY MODIFIED RADICAL;  Surgeon: Robert Bellow, MD;  Location: [facility];  Service: General;  Laterality: Right;   RENAL ANGIOGRAPHY Right 04/11/2018   Procedure: RENAL ANGIOGRAPHY;  Surgeon: Algernon Huxley, MD;  Location: [place] [facility];  Service: Cardiovascular;  Laterality: Right;   SIMPLE MASTECTOMY WITH AXILLARY SENTINEL NODE BIOPSY Left 10/13/2019   Procedure:  SIMPLE MASTECTOMY TRUE CUT BIOPSY, SENTINEL NODE BIOPSY;  Surgeon: Robert Bellow, MD;  Location: [facility];  Service: General;  Laterality: Left;    Family History  Problem Relation Age of Onset   Hypertension Mother    Hypertension Father    Stroke Father    Breast cancer Daughter 55    Allergies  Allergen Reactions   Penicillins Anaphylaxis   Drug Ingredient [Zinc]        Latest Ref Rng & Units 03/18/2022    5:42 PM 01/09/2022   10:19 AM 10/09/2021    9:14 AM  CBC  WBC 4.0 - 10.5 K/uL 10.5  6.3  6.8   Hemoglobin 12.0 - 15.0 g/dL 12.6  13.1  12.1   Hematocrit 36.0 - 46.0 % 37.6  38.8  36.0   Platelets 150 - 400 K/uL 146  151  170       CMP     Component Value Date/Time   NA 137 03/18/2022 1742   K 3.7 03/18/2022 1742   CL 104 03/18/2022 1742   CO2 24 03/18/2022 1742   GLUCOSE 113 (H) 03/18/2022 1742   BUN 52 (H) 03/18/2022 1742   CREATININE 1.42 (H) 03/18/2022 1742   CALCIUM 9.8 03/18/2022 1742   PROT 7.6 03/18/2022 1742   ALBUMIN 4.2 03/18/2022 1742   AST 29 03/18/2022 1742   ALT 20 03/18/2022 1742   ALKPHOS 68 03/18/2022 1742   BILITOT 0.8 03/18/2022 1742   GFRNONAA 35 (L) 03/18/2022 1742   GFRAA 28 (L) 01/18/2020 0935     No results found.     Assessment & Plan:   1. Renal artery stenosis (HCC) Recommend:  BP today remains somewhat elevated.  She will continue to work with her nephrologist and PCP for adjustments to her medications.  The patient's BP and noninvasive studies support the previous intervention is patent. No further intervention is indicated at this time.  Therefore the patient  will continue the current medications, no changes at this time.  The primary medical service will continue aggressive antihypertensive therapy as per the AHA guidelines  - VAS US RENAL ARTERY DUPLEX  2. Benign essential hypertension Continue antihypertensive medications as already ordered, these medications have been reviewed and there are no changes at  this time.  3. Dizziness Dizziness this may be a side effect from recent medication changes however given her history of renal stenosis and residual we will evaluate her carotid arteries to ensure there is no significant stenosis or occlusion.  Patient will return at her convenience for studies.   Current Outpatient Medications on File Prior  to Visit  Medication Sig Dispense Refill   allopurinol (ZYLOPRIM) 100 MG tablet      anastrozole (ARIMIDEX) 1 MG tablet Take 1 mg by mouth daily.     aspirin 81 MG EC tablet Take by mouth.     calcitRIOL (ROCALTROL) 0.25 MCG capsule Take by mouth.     citalopram (CELEXA) 10 MG tablet Take 10 mg by mouth daily.      clopidogrel (PLAVIX) 75 MG tablet TAKE 1 TABLET BY MOUTH EVERY DAY 30 tablet 1   cyanocobalamin (VITAMIN B12) 1000 MCG tablet Take by mouth.     docusate sodium (COLACE) 100 MG capsule Take 100 mg by mouth daily as needed for mild constipation.     DULoxetine (CYMBALTA) 60 MG capsule Take 60 mg by mouth daily.      ergocalciferol (VITAMIN D2) 1.25 MG (50000 UT) capsule Take 1 capsule by mouth once a week.     furosemide (LASIX) 20 MG tablet Take 20 mg by mouth daily.      gabapentin (NEURONTIN) 300 MG capsule Take 300 mg by mouth at bedtime.     loratadine (CLARITIN) 10 MG tablet Take 10 mg by mouth daily as needed for allergies. AM     metoprolol succinate (TOPROL-XL) 25 MG 24 hr tablet Take 37.5 mg by mouth at bedtime.      simvastatin (ZOCOR) 20 MG tablet Take 20 mg by mouth every evening. PM     No current facility-administered medications on file prior to visit.    There are no Patient Instructions on file for this visit. No follow-ups on file.   Kris Hartmann, NP

## 2022-05-12 NOTE — Progress Notes (Deleted)
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 Burnett] stage III T2N3 [19/24 LN]; ER/PR positive HER-2 negative.   # Left breast-invasive mammary carcinoma-status post mastectomy [id number]; stage I-ER/PR positive HER-2 negative  #November 23, 2019-start adjuvant radiation bilateral [finished August 31]; no chemo;   #September 22nd 2021- anastrozole ----------------------------------------------------------------------------------------   A. RIGHT BREAST, 3:00; ULTRASOUND-GUIDED BIOPSY:  - INVASIVE MAMMARY CARCINOMA WITH MICROPAPILLARY FEATURES; Size of invasive carcinoma: 19m in this sample  Histologic grade of invasive carcinoma: Grade 2 ; ER/PR > 905: her 2- NEG  A. RIGHT BREAST, 3:00; ULTRASOUND-GUIDED BIOPSY: - INVASIVE MAMMARY CARCINOMA WITH MICROPAPILLARY FEATURES. 111min this sample. Grade 2. Lymphovascular invasion: Present.   B. LYMPH NODE, RIGHT AXILLARY; ULTRASOUND-GUIDED NEEDLE CORE BIOPSY: - METASTATIC MAMMARY CARCINOMA, 6 MM IN THIS SAMPLE.   C. RIGHT BREAST, LATERAL CALCIFICATIONS; STEREOTACTIC BIOPSY: - DUCTAL CARCINOMA IN SITU, INTERMEDIATE GRADE, WITH CALCIFICATIONS. -------------------------------------------------------------   # CKD- Stage III-IV [Dr.Kolluru]  # SURVIVORSHIP:   # GENETICS:   DIAGNOSIS:  Right breast cancer stage III;  left breast cancer-stage I GOALS: Cure  CURRENT/MOST RECENT THERAPY : Anastrozole     Carcinoma of upper-inner quadrant of right breast in female, estrogen receptor positive ([facility] 09/[facility] Initial Diagnosis   Carcinoma of upper-inner quadrant of right breast in female, estrogen receptor positive ([facility]     HISTORY OF PRESENTING ILLNESS: Ambulating walking with a rolling walker.  Alone.  [name]36.o.  female synchronous bilateral breast cancer [right stage III ER/PR positive;Her2 NEG; left stage I ER/PR positive; Her2- NEG] s/p adjuvant radiation; currently on Anastrazole is here for follow-up/review results of the MRI liver.   Status post evaluation with neurology in the interim for headaches.  Prescribed baclofen.   Patient continues to have ongoing fatigue.  Otherwise denies any chest pain or shortness of the cough.   Review of Systems  Constitutional:  Positive for malaise/fatigue. Negative for chills, diaphoresis, fever and weight loss.  HENT:  Negative for nosebleeds and sore throat.   Eyes:  Negative for double vision.  Respiratory:  Negative for cough, hemoptysis, sputum production, shortness of breath and wheezing.   Cardiovascular:  Negative for chest pain, palpitations, orthopnea and leg swelling.  Gastrointestinal:  Negative for abdominal pain, blood in stool, constipation, diarrhea, heartburn, melena, nausea and vomiting.  Genitourinary:  Negative for dysuria, frequency and urgency.  Musculoskeletal:  Positive for back pain and joint pain.  Neurological:  Negative for dizziness, tingling, focal weakness, weakness and headaches.  Endo/Heme/Allergies:  Does not bruise/bleed easily.  Psychiatric/Behavioral:  Negative for depression. The patient is not nervous/anxious and does not have insomnia.     MEDICAL HISTORY:  Past Medical History:  Diagnosis Date  . Arthritis    Gout  . Chronic kidney disease   . Diarrhea   . Hypercholesteremia   . Hypertension   . Neuropathy    feet and lower legs  . Seasonal allergies   . Skin cancer    face     SURGICAL HISTORY: Past Surgical History:  Procedure Laterality Date  .  ABDOMINAL HYSTERECTOMY  2000  . APPENDECTOMY    . BREAST BIOPSY Right 09/19/2019   affirm bx of calcs UOQ, x marker, path pending  . BREAST BIOPSY Right 09/19/2019   [place] bx of mass,heart marker, path pending  . BREAST BIOPSY Right 09/19/2019   [place] bx of LN, coil marker, path pending  . CATARACT EXTRACTION Left   . CATARACT EXTRACTION W/PHACO Right 10/24/2014   Procedure: CATARACT EXTRACTION PHACO AND INTRAOCULAR LENS PLACEMENT ([facility]);  Surgeon: Leandrew Koyanagi, MD;  Location: [place];  Service: Ophthalmology;  Laterality: Right;  . ESOPHAGOGASTRODUODENOSCOPY N/A 03/07/2018   Procedure: ESOPHAGOGASTRODUODENOSCOPY (EGD);  Surgeon: Lin Landsman, MD;  Location: [facility] ENDOSCOPY;  Service: Gastroenterology;  Laterality: N/A;  . MASTECTOMY MODIFIED RADICAL Right 10/13/2019   Procedure: MASTECTOMY MODIFIED RADICAL;  Surgeon: Robert Bellow, MD;  Location: [facility];  Service: General;  Laterality: Right;  . RENAL ANGIOGRAPHY Right 04/11/2018   Procedure: RENAL ANGIOGRAPHY;  Surgeon: Algernon Huxley, MD;  Location: [place] [facility];  Service: Cardiovascular;  Laterality: Right;  . SIMPLE MASTECTOMY WITH AXILLARY SENTINEL NODE BIOPSY Left 10/13/2019   Procedure: SIMPLE MASTECTOMY TRUE CUT BIOPSY, SENTINEL NODE BIOPSY;  Surgeon: Robert Bellow, MD;  Location: [facility];  Service: General;  Laterality: Left;    SOCIAL HISTORY: Social History   Socioeconomic History  . Marital status: Widowed    Spouse name: Not on file  . Number of children: Not on file  . Years of education: Not on file  . Highest education level: Not on file  Occupational History  . Not on file  Tobacco Use  . Smoking status: Never  . Smokeless tobacco: Never  Substance and Sexual Activity  . Alcohol use: No  . Drug use: Never  . Sexual activity: Not on file  Other Topics Concern  . 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    Social Determinants of Health   Financial Resource Strain: Not on file  Food Insecurity: Not on file  Transportation Needs: Not on file  Physical Activity: Not on file  Stress: Not on file  Social Connections: Not on file  Intimate Partner Violence: Not on file    FAMILY HISTORY: Family History  Problem Relation Age of Onset  . Hypertension Mother   . Hypertension Father   . Stroke Father   . Breast cancer Daughter 35    ALLERGIES:  is allergic to penicillins and drug ingredient [zinc].  MEDICATIONS:  Current Outpatient Medications  Medication Sig Dispense Refill  . allopurinol (ZYLOPRIM) 100 MG tablet     . anastrozole (ARIMIDEX) 1 MG tablet Take 1 mg by mouth daily.    Marland Kitchen aspirin 81 MG EC tablet Take by mouth.    . calcitRIOL (ROCALTROL) 0.25 MCG capsule Take by mouth.    . citalopram (CELEXA) 10 MG tablet Take 10 mg by mouth daily.     . clopidogrel (PLAVIX) 75 MG tablet TAKE 1 TABLET BY MOUTH EVERY DAY 30 tablet 1  . cyanocobalamin (VITAMIN B12) 1000 MCG tablet Take by mouth.    . docusate sodium (COLACE) 100 MG capsule Take 100 mg by mouth daily as needed for mild constipation.    . DULoxetine (CYMBALTA) 60 MG capsule Take 60 mg by mouth daily.     . ergocalciferol (VITAMIN D2) 1.25 MG (50000 UT) capsule Take 1 capsule by mouth once a week.    Marland Kitchen  furosemide (LASIX) 20 MG tablet Take 20 mg by mouth daily.     Marland Kitchen gabapentin (NEURONTIN) 300 MG capsule Take 300 mg by mouth at bedtime.    Marland Kitchen loratadine (CLARITIN) 10 MG tablet Take 10 mg by mouth daily as needed for allergies. AM    . metoprolol succinate (TOPROL-XL) 25 MG 24 hr tablet Take 37.5 mg by mouth at bedtime.     . simvastatin (ZOCOR) 20 MG tablet Take 20 mg by mouth every evening. PM     No current facility-administered medications for this visit.      Marland Kitchen  PHYSICAL EXAMINATION: ECOG PERFORMANCE STATUS: 0 -  Asymptomatic  There were no vitals filed for this visit.   There were no vitals filed for this visit.    Physical Exam Constitutional:      Comments: Walks with a rolling walker.  Alone.  HENT:     Head: Normocephalic and atraumatic.     Mouth/Throat:     Pharynx: No oropharyngeal exudate.  Eyes:     Pupils: Pupils are equal, round, and reactive to light.  Cardiovascular:     Rate and Rhythm: Normal rate and regular rhythm.  Pulmonary:     Effort: Pulmonary effort is normal. No respiratory distress.     Breath sounds: Normal breath sounds. No wheezing.  Abdominal:     General: Bowel sounds are normal. There is no distension.     Palpations: Abdomen is soft. There is no mass.     Tenderness: There is no abdominal tenderness. There is no guarding or rebound.  Musculoskeletal:        General: No tenderness. Normal range of motion.     Cervical back: Normal range of motion and neck supple.  Skin:    General: Skin is warm.     Comments: Bruising bruising noted on the chest and left arm.  Neurological:     Mental Status: She is alert and oriented to person, place, and time.  Psychiatric:        Mood and Affect: Affect normal.    Results for Tina, Curtis (MRN [id number]) as of 01/15/2021 15:37  Ref. Range 08/27/2020 09:55 09/02/2020 12:32 10/22/2020 14:01 12/04/2020 14:35 01/15/2021 14:50  CA 15-3 Latest Ref Range: 0.0 - 25.0 U/mL   32.0 (H) 27.9 (H)   CA 27.29 Latest Ref Range: 0.0 - 38.6 U/mL   38.0 27.8   CEA Latest Ref Range: 0.0 - 4.7 ng/mL   4.1 3.6    LABORATORY DATA:  I have reviewed the data as listed Lab Results  Component Value Date   WBC 10.5 03/18/2022   HGB 12.6 03/18/2022   HCT 37.6 03/18/2022   MCV 92.6 03/18/2022   PLT 146 (L) 03/18/2022   Recent Labs    10/09/21 0914 01/09/22 1019 03/18/22 1742  NA 135 140 137  K 4.3 3.7 3.7  CL 102 104 104  CO2 _0 GLUCOSE 253* 167* 113*  BUN 54* 42* 52*  CREATININE 1.61* 1.59* 1.42*  CALCIUM 9.6 9.5 9.8   GFRNONAA 30* 30* 35*  PROT 7.2 7.9 7.6  ALBUMIN 3.9 4.1 4.2  AST _1 ALT _2 ALKPHOS 70 83 68  BILITOT 0.4 0.6 0.8           RADIOGRAPHIC STUDIES: I have personally reviewed the radiological images as listed and agreed with the findings in the report. No results found.  ASSESSMENT & PLAN:   Carcinoma of  upper-inner quadrant of right breast in female, estrogen receptor positive ([place])  # Synchronous bilateral breast cancer-status post bilateral mastectomies- Right Breast-stage III ER/PR positive HER-2 NEG; & Left breast cancer stage II ER/PR Pos; Her 2 NEG. PET scan- April 20th, 2023-  Interval bilateral mastectomy and right axillary node dissection. No evidence of chest wall recurrence or thoracic metastatic disease.  Questionable small hypermetabolic lesion centrally in the right hepatic lobe; MRI LIVER  MAY 25th, 2023- NEGATIVE for any cancer; but cysts [see below].    # CONTINUE  anastrozole-as patient's fatigue/headaches have not improved holding anastrozole.   # MAY 2023Abelina Curtis MRI]-  Multi cystic lesions of the pancreas with enlargement measuring 2.6 cm greatest axial dimension; with relative stability since 2021. These are likely side branch intraductal papillary mucinous neoplasms, numerous but without high-risk features aside from enlargement since 2019. Continued attention on subsequent imaging or dedicated MRI/MRCP in 1-2 years [may 2024-25].   # New onset headaches- ? [seen Dr.Shah > 4 years];  MRI- Brain May  2023-  No acute intracranial pathology. S/p evaluation with Dr.Shah- on baclofen.   # Elevated BG- PBF- 250 [Dr.Baboff- HbA1C- 7.4]; Awaiting re-evaluation in 2 weeks.  Defer to PCP re: oral hypoglycemic drugs.  # Mild-moderate arthritic pain-osteoarthritis [bil knee L >R] G-1-2; STABLE;  continue tylenol prn.    # vit D def [on ergocalciferol]--STABLE  # Fatigue ? Etiology; refer to care program s/p Evaluation with Gwenette Greet.   # CKD- Stage- IV-  GFR 29 [Dr.Kolluru]; -stable  # NOV 2021- OsteoporosisT-scoreof -3.7.on ca+vit D. Prolia q 6 months [Feb 4315]. AUG  in 2023.   #Incidental findings on Imaging  MAY , 2023:3. 3.6 x 3.3 cm infrarenal abdominal aortic aneurysm is increased since 2019 and stable since April of 2023, enlarged since 2019 and 2021 evaluations;  Sludge in the gallbladder. I reviewed/discussed/counseled the patient.   # DISPOSITION:  # CARE program  referral re: fatigue/gait instability # follow up in 3 month- MD; labs- cbc/cmp; ca27;29; CEA; ca15-3; 25-OH vit D-Zometa Dr.B  Cc; Dr.Baboff    All questions were answered. The patient/family knows to call the clinic with any problems, questions or concerns.     Tina Sickle, MD 05/12/2022 8:37 AM

## 2022-05-12 NOTE — Assessment & Plan Note (Deleted)
#  Synchronous bilateral breast cancer-status post bilateral mastectomies- Right Breast-stage III ER/PR positive HER-2 NEG; & Left breast cancer stage II ER/PR Pos; Her 2 NEG. PET scan- April 20th, 2023-  Interval bilateral mastectomy and right axillary node dissection. No evidence of chest wall recurrence or thoracic metastatic disease.  Questionable small hypermetabolic lesion centrally in the right hepatic lobe; MRI LIVER  MAY 25th, 2023- NEGATIVE for any cancer; but cysts [see below].   # CONTINUE  anastrozole-as patient's fatigue/headaches have not improved holding anastrozole.   # MAY 2023- [liver MRI]-  Multi cystic lesions of the pancreas with enlargement measuring 2.6 cm greatest axial dimension; with relative stability since 2021. These are likely side branch intraductal papillary mucinous neoplasms, numerous but without high-risk features aside from enlargement since 2019. Continued attention on subsequent imaging or dedicated MRI/MRCP in 1-2 years [may 2024-25].   # New onset headaches- ? [seen Dr.Shah > 4 years];  MRI- Brain May  2023-  No acute intracranial pathology. S/p evaluation with Dr.Shah- on baclofen.   # Elevated BG- PBF- 250 [Dr.Baboff- HbA1C- 7.4]; Awaiting re-evaluation in 2 weeks.  Defer to PCP re: oral hypoglycemic drugs.  # Mild-moderate arthritic pain-osteoarthritis [bil knee L >R] G-1-2; STABLE;  continue tylenol prn.    # vit D def [on ergocalciferol]--STABLE  # Fatigue ? Etiology; refer to care program s/p Evaluation with Maureen.   # CKD- Stage- IV- GFR 29 [Dr.Kolluru]; -stable  # NOV 2021- OsteoporosisT-scoreof -3.7.on ca+vit D. Prolia q 6 months [Feb 2023]. AUG  in 2023.   #Incidental findings on Imaging  MAY , 2023:3. 3.6 x 3.3 cm infrarenal abdominal aortic aneurysm is increased since 2019 and stable since April of 2023, enlarged since 2019 and 2021 evaluations;  Sludge in the gallbladder. I reviewed/discussed/counseled the patient.   # DISPOSITION:  #  CARE program  referral re: fatigue/gait instability # follow up in 3 month- MD; labs- cbc/cmp; ca27;29; CEA; ca15-3; 25-OH vit D-Zometa Dr.B  Cc; Dr.Baboff  

## 2022-05-13 ENCOUNTER — Encounter: Payer: Self-pay | Admitting: Internal Medicine

## 2022-05-13 ENCOUNTER — Inpatient Hospital Stay: Payer: Medicare PPO | Attending: Internal Medicine

## 2022-05-13 ENCOUNTER — Inpatient Hospital Stay (HOSPITAL_BASED_OUTPATIENT_CLINIC_OR_DEPARTMENT_OTHER): Payer: Medicare PPO | Admitting: Internal Medicine

## 2022-05-13 VITALS — BP 163/73 | HR 66 | Temp 98.0°F | Resp 18 | Wt 140.0 lb

## 2022-05-13 DIAGNOSIS — E559 Vitamin D deficiency, unspecified: Secondary | ICD-10-CM | POA: Insufficient documentation

## 2022-05-13 DIAGNOSIS — C773 Secondary and unspecified malignant neoplasm of axilla and upper limb lymph nodes: Secondary | ICD-10-CM | POA: Diagnosis not present

## 2022-05-13 DIAGNOSIS — Z79811 Long term (current) use of aromatase inhibitors: Secondary | ICD-10-CM | POA: Diagnosis not present

## 2022-05-13 DIAGNOSIS — N184 Chronic kidney disease, stage 4 (severe): Secondary | ICD-10-CM | POA: Insufficient documentation

## 2022-05-13 DIAGNOSIS — M1712 Unilateral primary osteoarthritis, left knee: Secondary | ICD-10-CM | POA: Insufficient documentation

## 2022-05-13 DIAGNOSIS — K862 Cyst of pancreas: Secondary | ICD-10-CM | POA: Diagnosis not present

## 2022-05-13 DIAGNOSIS — M810 Age-related osteoporosis without current pathological fracture: Secondary | ICD-10-CM

## 2022-05-13 DIAGNOSIS — Z90710 Acquired absence of both cervix and uterus: Secondary | ICD-10-CM | POA: Insufficient documentation

## 2022-05-13 DIAGNOSIS — Z803 Family history of malignant neoplasm of breast: Secondary | ICD-10-CM | POA: Diagnosis not present

## 2022-05-13 DIAGNOSIS — Z85828 Personal history of other malignant neoplasm of skin: Secondary | ICD-10-CM | POA: Diagnosis not present

## 2022-05-13 DIAGNOSIS — R5383 Other fatigue: Secondary | ICD-10-CM | POA: Diagnosis not present

## 2022-05-13 DIAGNOSIS — C50811 Malignant neoplasm of overlapping sites of right female breast: Secondary | ICD-10-CM | POA: Diagnosis present

## 2022-05-13 DIAGNOSIS — C50211 Malignant neoplasm of upper-inner quadrant of right female breast: Secondary | ICD-10-CM

## 2022-05-13 DIAGNOSIS — Z170 Estrogen receptor positive status [ER+]: Secondary | ICD-10-CM | POA: Diagnosis not present

## 2022-05-13 DIAGNOSIS — I129 Hypertensive chronic kidney disease with stage 1 through stage 4 chronic kidney disease, or unspecified chronic kidney disease: Secondary | ICD-10-CM | POA: Insufficient documentation

## 2022-05-13 DIAGNOSIS — R519 Headache, unspecified: Secondary | ICD-10-CM | POA: Insufficient documentation

## 2022-05-13 LAB — CBC WITH DIFFERENTIAL/PLATELET
Abs Immature Granulocytes: 0.03 10*3/uL (ref 0.00–0.07)
Basophils Absolute: 0.1 10*3/uL (ref 0.0–0.1)
Basophils Relative: 1 %
Eosinophils Absolute: 0.5 10*3/uL (ref 0.0–0.5)
Eosinophils Relative: 8 %
HCT: 37.1 % (ref 36.0–46.0)
Hemoglobin: 12.4 g/dL (ref 12.0–15.0)
Immature Granulocytes: 0 %
Lymphocytes Relative: 19 %
Lymphs Abs: 1.4 10*3/uL (ref 0.7–4.0)
MCH: 31.5 pg (ref 26.0–34.0)
MCHC: 33.4 g/dL (ref 30.0–36.0)
MCV: 94.2 fL (ref 80.0–100.0)
Monocytes Absolute: 0.8 10*3/uL (ref 0.1–1.0)
Monocytes Relative: 11 %
Neutro Abs: 4.4 10*3/uL (ref 1.7–7.7)
Neutrophils Relative %: 61 %
Platelets: 189 10*3/uL (ref 150–400)
RBC: 3.94 MIL/uL (ref 3.87–5.11)
RDW: 13.4 % (ref 11.5–15.5)
WBC: 7.2 10*3/uL (ref 4.0–10.5)
nRBC: 0 % (ref 0.0–0.2)

## 2022-05-13 LAB — COMPREHENSIVE METABOLIC PANEL
ALT: 13 U/L (ref 0–44)
AST: 21 U/L (ref 15–41)
Albumin: 4 g/dL (ref 3.5–5.0)
Alkaline Phosphatase: 94 U/L (ref 38–126)
Anion gap: 8 (ref 5–15)
BUN: 36 mg/dL — ABNORMAL HIGH (ref 8–23)
CO2: 26 mmol/L (ref 22–32)
Calcium: 9.2 mg/dL (ref 8.9–10.3)
Chloride: 105 mmol/L (ref 98–111)
Creatinine, Ser: 1.36 mg/dL — ABNORMAL HIGH (ref 0.44–1.00)
GFR, Estimated: 36 mL/min — ABNORMAL LOW (ref >60)
Glucose, Bld: 238 mg/dL — ABNORMAL HIGH (ref 70–99)
Potassium: 4.1 mmol/L (ref 3.5–5.1)
Sodium: 139 mmol/L (ref 135–145)
Total Bilirubin: 0.7 mg/dL (ref 0.3–1.2)
Total Protein: 7.7 g/dL (ref 6.5–8.1)

## 2022-05-13 NOTE — Assessment & Plan Note (Addendum)
#  Synchronous bilateral breast cancer-status post bilateral mastectomies- Right Breast-stage III ER/PR positive HER-2 NEG; & Left breast cancer stage II ER/PR Pos; Her 2 NEG. PET scan- April 20th, 2023-  Interval bilateral mastectomy and right axillary node dissection. No evidence of chest wall recurrence or thoracic metastatic disease.  Questionable small hypermetabolic lesion centrally in the right hepatic lobe; MRI LIVER  MAY 25th, 2023- NEGATIVE for any cancer; but cysts [see below].    # CONTINUE  anastrozole-as patient's fatigue/headaches have not improved holding anastrozole.   # MAY 2023Abelina Bachelor MRI]-  Multi cystic lesions of the pancreas with enlargement measuring 2.6 cm greatest axial dimension; with relative stability since 2021.without high-risk features aside from enlargement since 2019. Continued attention on subsequent imaging or dedicated MRI/MRCP in 1-2 years [may 2024-25].   # chronic headaches- ? [seen Dr.Shah > 4 years];  MRI- Brain May  2023-  No acute intracranial pathology. S/p evaluation with Dr.Shah- on baclofen. STABLE.   # Elevated BG- PBF- 238 [Dr.Baboff- HbA1C- 7.4]; not on medications.  Defer to PCP re:  management of DM.   # Elevated HTN- s/p evaluation with vascular; awaiting renal US.   # Mild-moderate arthritic pain-osteoarthritis [bil knee L >R] G-1-2; continue tylenol prn.  STABLE;    # vit D def [on ergocalciferol]--STABLE  # Fatigue ? Etiology; s/p Evaluation with Gwenette Greet- s/p CARE program-   # CKD- Stage- IV- GFR 29 [Dr.Kolluru]; -STABLE;    # NOV 2021- OsteoporosisT-scoreof -3.7.on ca+vit D. Prolia q 6 months [Feb 6468]. sep in 2023. STABLE.   # DISPOSITION:  # follow up in 3 month- MD; labs- cbc/cmp; ca27;29; CEA; ca15-3; 25-OH vit D; possible Prolia- Dr.B  Cc; Dr.Baboff

## 2022-05-13 NOTE — Progress Notes (Signed)
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   # Left breast-invasive mammary carcinoma-status post mastectomy [id number]; stage I-ER/PR positive Tina Curtis-2 negative  #November 23, 2019-start adjuvant radiation bilateral [finished August 31]; no chemo;   #September 22nd 2021- anastrozole ----------------------------------------------------------------------------------------   A. RIGHT BREAST, 3:00; ULTRASOUND-GUIDED BIOPSY:  - INVASIVE MAMMARY CARCINOMA WITH MICROPAPILLARY FEATURES; Size of invasive carcinoma: 62m in this sample  Histologic grade of invasive carcinoma: Grade 2 ; ER/PR > 905: Tina Curtis 2- NEG  A. RIGHT BREAST, 3:00; ULTRASOUND-GUIDED BIOPSY: - INVASIVE MAMMARY CARCINOMA WITH MICROPAPILLARY FEATURES. 131min this sample. Grade 2. Lymphovascular invasion: Present.   B. LYMPH NODE, RIGHT AXILLARY; ULTRASOUND-GUIDED NEEDLE CORE BIOPSY: - METASTATIC MAMMARY CARCINOMA, 6 MM IN THIS SAMPLE.   C. RIGHT BREAST, LATERAL CALCIFICATIONS; STEREOTACTIC BIOPSY: - DUCTAL CARCINOMA IN SITU, INTERMEDIATE GRADE, WITH CALCIFICATIONS. -------------------------------------------------------------   # CKD- Stage III-IV [Tina Curtis]  # SURVIVORSHIP:   # GENETICS:   DIAGNOSIS:  Right breast cancer stage III;  left breast cancer-stage I GOALS: Cure  CURRENT/MOST RECENT THERAPY : Anastrozole     Carcinoma of upper-inner quadrant of right breast in female, estrogen receptor positive ([facility] 09/27/2019 Initial Diagnosis   Carcinoma of upper-inner quadrant of right breast in female, estrogen receptor positive ([facility]     HISTORY OF PRESENTING ILLNESS: Ambulating walking with a rolling walker.  Alone.  [name]89.o.  female synchronous bilateral breast cancer [right stage III ER/PR positive;Her2 NEG; left stage I ER/PR positive; Her2- NEG] s/p adjuvant radiation; currently on Anastrazole is here for follow-up.   Patient here today for follow up regarding breast cancer. Patient reports Tina Curtis is tolerating anastrozole well, denies any side effects.  Patient reports headaches, dizziness Status post evaluation with neurology.  On  baclofen.   Patient continues to have ongoing fatigue.  Otherwise denies any chest pain or shortness of the cough.   Review of Systems  Constitutional:  Positive for malaise/fatigue. Negative for chills, diaphoresis, fever and weight loss.  HENT:  Negative for nosebleeds and sore throat.   Eyes:  Negative for double vision.  Respiratory:  Negative for cough, hemoptysis, sputum production, shortness of breath and wheezing.   Cardiovascular:  Negative for chest pain, palpitations, orthopnea and leg swelling.  Gastrointestinal:  Negative for abdominal pain, blood in stool, constipation, diarrhea, heartburn, melena, nausea and vomiting.  Genitourinary:  Negative for dysuria, frequency and urgency.  Musculoskeletal:  Positive for back pain and joint pain.  Neurological:  Negative for dizziness, tingling, focal weakness, weakness and headaches.  Endo/Heme/Allergies:  Does not bruise/bleed easily.  Psychiatric/Behavioral:  Negative for depression. The patient is not nervous/anxious and does not have insomnia.      MEDICAL HISTORY:  Past Medical History:  Diagnosis Date   Arthritis    Gout   Chronic kidney disease    Diarrhea    Hypercholesteremia     Hypertension    Neuropathy    feet and lower legs   Seasonal allergies    Skin cancer  face    SURGICAL HISTORY: Past Surgical History:  Procedure Laterality Date   ABDOMINAL HYSTERECTOMY  2000   APPENDECTOMY     BREAST BIOPSY Right 09/19/2019   affirm bx of calcs UOQ, x marker, path pending   BREAST BIOPSY Right 09/19/2019   [place] bx of mass,heart marker, path pending   BREAST BIOPSY Right 09/19/2019   [place] bx of LN, coil marker, path pending   CATARACT EXTRACTION Left    CATARACT EXTRACTION W/PHACO Right 10/24/2014   Procedure: CATARACT EXTRACTION PHACO AND INTRAOCULAR LENS PLACEMENT ([place]);  Surgeon: Tina Koyanagi, MD;  Location: [facility];  Service: Ophthalmology;  Laterality: Right;   ESOPHAGOGASTRODUODENOSCOPY N/A 03/07/2018   Procedure: ESOPHAGOGASTRODUODENOSCOPY (EGD);  Surgeon: Tina Landsman, MD;  Location: [facility];  Service: Gastroenterology;  Laterality: N/A;   MASTECTOMY MODIFIED RADICAL Right 10/13/2019   Procedure: MASTECTOMY MODIFIED RADICAL;  Surgeon: Tina Bellow, MD;  Location: [facility];  Service: General;  Laterality: Right;   RENAL ANGIOGRAPHY Right 04/11/2018   Procedure: RENAL ANGIOGRAPHY;  Surgeon: Tina Huxley, MD;  Location: [facility];  Service: Cardiovascular;  Laterality: Right;   SIMPLE MASTECTOMY WITH AXILLARY SENTINEL NODE BIOPSY Left 10/13/2019   Procedure: SIMPLE MASTECTOMY TRUE CUT BIOPSY, SENTINEL NODE BIOPSY;  Surgeon: Tina Bellow, MD;  Location: [facility];  Service: General;  Laterality: Left;    SOCIAL HISTORY: Social History   Socioeconomic History   Marital status: Widowed    Spouse name: Not on file   Number of children: Not on file   Years of education: Not on file   Highest education level: Not on file  Occupational History   Not on file  Tobacco Use   Smoking status: Never   Smokeless tobacco: Never  Substance and Sexual Activity   Alcohol use: No   Drug use: Never   Sexual activity:  Not on file  Other Topics Concern   Not on file  Social History Narrative   2 daughters; lives in [place]; Pharmacist, 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    Social Determinants of Health   Financial Resource Strain: Not on file  Food Insecurity: Not on file  Transportation Needs: Not on file  Physical Activity: Not on file  Stress: Not on file  Social Connections: Not on file  Intimate Partner Violence: Not on file    FAMILY HISTORY: Family History  Problem Relation Age of Onset   Hypertension Mother    Hypertension Father    Stroke Father    Breast cancer Daughter 33    ALLERGIES:  is allergic to penicillins and drug ingredient [zinc].  MEDICATIONS:  Current Outpatient Medications  Medication Sig Dispense Refill   allopurinol (ZYLOPRIM) 100 MG tablet      anastrozole (ARIMIDEX) 1 MG tablet Take 1 mg by mouth daily.     aspirin 81 MG EC tablet Take by mouth.     citalopram (CELEXA) 10 MG tablet Take 10 mg by mouth daily.      clopidogrel (PLAVIX) 75 MG tablet TAKE 1 TABLET BY MOUTH EVERY DAY 30 tablet 1   cyanocobalamin (VITAMIN B12) 1000 MCG tablet Take by mouth.     docusate sodium (COLACE) 100 MG capsule Take 100 mg by mouth daily as needed for mild constipation.     DULoxetine (CYMBALTA) 60 MG capsule Take 60 mg by mouth daily.      ergocalciferol (VITAMIN D2) 1.25 MG (50000 UT) capsule Take 1 capsule by mouth once  a week.     furosemide (LASIX) 20 MG tablet Take 20 mg by mouth daily.      gabapentin (NEURONTIN) 300 MG capsule Take 300 mg by mouth at bedtime.     hydrALAZINE (APRESOLINE) 25 MG tablet Take 1 tablet by mouth 3 (three) times daily.     loratadine (CLARITIN) 10 MG tablet Take 10 mg by mouth daily as needed for allergies. AM     metoprolol succinate (TOPROL-XL) 25 MG 24 hr tablet Take 37.5 mg by mouth at bedtime.      simvastatin (ZOCOR) 20 MG tablet Take 20 mg by mouth every evening. PM     No current facility-administered  medications for this visit.      Marland Kitchen  PHYSICAL EXAMINATION: ECOG PERFORMANCE STATUS: 0 - Asymptomatic  Vitals:   05/13/22 1019  BP: (!) 163/73  Pulse: 66  Resp: 18  Temp: 98 F (36.7 C)    Filed Weights   05/13/22 1019  Weight: 140 lb (63.5 kg)     Physical Exam Constitutional:      Comments: Walks with a rolling walker.  Alone.  HENT:     Head: Normocephalic and atraumatic.     Mouth/Throat:     Pharynx: No oropharyngeal exudate.  Eyes:     Pupils: Pupils are equal, round, and reactive to light.  Cardiovascular:     Rate and Rhythm: Normal rate and regular rhythm.  Pulmonary:     Effort: Pulmonary effort is normal. No respiratory distress.     Breath sounds: Normal breath sounds. No wheezing.  Abdominal:     General: Bowel sounds are normal. There is no distension.     Palpations: Abdomen is soft. There is no mass.     Tenderness: There is no abdominal tenderness. There is no guarding or rebound.  Musculoskeletal:        General: No tenderness. Normal range of motion.     Cervical back: Normal range of motion and neck supple.  Skin:    General: Skin is warm.     Comments: Bruising bruising noted on the chest and left arm.  Neurological:     Mental Status: Tina Curtis is alert and oriented to person, place, and time.  Psychiatric:        Mood and Affect: Affect normal.     Results for ESTEPHANI, POPPER (MRN [id number]) as of 01/15/2021 15:37  Ref. Range 08/27/2020 09:55 09/02/2020 12:32 10/22/2020 14:01 12/04/2020 14:35 01/15/2021 14:50  CA 15-3 Latest Ref Range: 0.0 - 25.0 U/mL   32.0 (H) 27.9 (H)   CA 27.29 Latest Ref Range: 0.0 - 38.6 U/mL   38.0 27.8   CEA Latest Ref Range: 0.0 - 4.7 ng/mL   4.1 3.6    LABORATORY DATA:  I have reviewed the data as listed Lab Results  Component Value Date   WBC 7.2 05/13/2022   HGB 12.4 05/13/2022   HCT 37.1 05/13/2022   MCV 94.2 05/13/2022   PLT 189 05/13/2022   Recent Labs    01/09/22 1019 03/18/22 1742 05/13/22 0949  NA 140  137 139  K 3.7 3.7 4.1  CL 104 104 105  CO2 _0 GLUCOSE 167* 113* 238*  BUN 42* 52* 36*  CREATININE 1.59* 1.42* 1.36*  CALCIUM 9.5 9.8 9.2  GFRNONAA 30* 35* 36*  PROT 7.9 7.6 7.7  ALBUMIN 4.1 4.2 4.0  AST _1 ALT _2 ALKPHOS 83 68 94  BILITOT  0.6 0.8 0.7          RADIOGRAPHIC STUDIES: I have personally reviewed the radiological images as listed and agreed with the findings in the report. No results found.  ASSESSMENT & PLAN:   Carcinoma of upper-inner quadrant of right breast in female, estrogen receptor positive ([place])  # Synchronous bilateral breast cancer-status post bilateral mastectomies- Right Breast-stage III ER/PR positive Tina Curtis-2 NEG; & Left breast cancer stage II ER/PR Pos; Tina Curtis 2 NEG. PET scan- April 20th, 2023-  Interval bilateral mastectomy and right axillary node dissection. No evidence of chest wall recurrence or thoracic metastatic disease.  Questionable small hypermetabolic lesion centrally in the right hepatic lobe; MRI LIVER  MAY 25th, 2023- NEGATIVE for any cancer; but cysts [see below].    # CONTINUE  anastrozole-as patient's fatigue/headaches have not improved holding anastrozole.   # MAY 2023[facility]]-  Multi cystic lesions of the pancreas with enlargement measuring 2.6 cm greatest axial dimension; with relative stability since 2021.without high-risk features aside from enlargement since 2019. Continued attention on subsequent imaging or dedicated MRI/MRCP in 1-2 years [may 2024-25].   # chronic headaches- ? [seen TinaShah > 4 years];  MRI- Brain May  2023-  No acute intracranial pathology. S/p evaluation with TinaShah- on baclofen.   # Elevated BG- PBF- 238 [TinaBaboff- HbA1C- 7.4];  Defer to PCP re:  management of DM.   # Elevated HTN- s/p evaluation with vascular; awaiting renal US.   # Mild-moderate arthritic pain-osteoarthritis [bil knee L >R] G-1-2; continue tylenol prn.  STABLE;    # vit D def [on ergocalciferol]--STABLE  #  Fatigue ? Etiology; s/p Evaluation with Gwenette Greet- s/p CARE program-   # CKD- Stage- IV- GFR 29 [Tina Curtis]; -STABLE;    # NOV 2021- OsteoporosisT-scoreof -3.7.on ca+vit D. Prolia q 6 months [Feb 1423]. sep in 2023. STABLE.   # DISPOSITION:  # follow up in 3 month- MD; labs- cbc/cmp; ca27;29; CEA; ca15-3; 25-OH vit D; possible Prolia- TinaB  Cc; TinaBaboff     All questions were answered. The patient/family knows to call the clinic with any problems, questions or concerns.     Tina Sickle, MD 05/13/2022 12:50 PM

## 2022-05-13 NOTE — Progress Notes (Signed)
Patient here today for follow up regarding breast cancer. Patient reports she is tolerating anastrozole well, denies any side effects. Patient reports headaches, dizziness.

## 2022-05-14 LAB — CANCER ANTIGEN 15-3: CA 15-3: 36.5 U/mL — ABNORMAL HIGH (ref 0.0–25.0)

## 2022-05-14 LAB — CANCER ANTIGEN 27.29: CA 27.29: 40.3 U/mL — ABNORMAL HIGH (ref 0.0–38.6)

## 2022-05-14 LAB — CEA: CEA: 4.9 ng/mL — ABNORMAL HIGH (ref 0.0–4.7)

## 2022-05-17 LAB — 25-HYDROXY VITAMIN D LCMS D2+D3
25-Hydroxy, Vitamin D-2: 1 ng/mL
25-Hydroxy, Vitamin D-3: 31 ng/mL
25-Hydroxy, Vitamin D: 32 ng/mL

## 2022-06-01 ENCOUNTER — Other Ambulatory Visit (INDEPENDENT_AMBULATORY_CARE_PROVIDER_SITE_OTHER): Payer: Self-pay | Admitting: Nurse Practitioner

## 2022-06-01 DIAGNOSIS — R42 Dizziness and giddiness: Secondary | ICD-10-CM

## 2022-06-02 ENCOUNTER — Ambulatory Visit (INDEPENDENT_AMBULATORY_CARE_PROVIDER_SITE_OTHER): Payer: Medicare PPO

## 2022-06-02 ENCOUNTER — Encounter (INDEPENDENT_AMBULATORY_CARE_PROVIDER_SITE_OTHER): Payer: Self-pay | Admitting: Vascular Surgery

## 2022-06-02 ENCOUNTER — Ambulatory Visit (INDEPENDENT_AMBULATORY_CARE_PROVIDER_SITE_OTHER): Payer: Medicare PPO | Admitting: Vascular Surgery

## 2022-06-02 VITALS — BP 115/66 | HR 95 | Resp 18 | Ht 62.0 in | Wt 136.0 lb

## 2022-06-02 DIAGNOSIS — I150 Renovascular hypertension: Secondary | ICD-10-CM

## 2022-06-02 DIAGNOSIS — N184 Chronic kidney disease, stage 4 (severe): Secondary | ICD-10-CM | POA: Diagnosis not present

## 2022-06-02 DIAGNOSIS — I701 Atherosclerosis of renal artery: Secondary | ICD-10-CM | POA: Diagnosis not present

## 2022-06-02 DIAGNOSIS — R42 Dizziness and giddiness: Secondary | ICD-10-CM | POA: Diagnosis not present

## 2022-06-02 NOTE — Assessment & Plan Note (Signed)
Avoid contrast unless absolutely necessary. 

## 2022-06-02 NOTE — Progress Notes (Signed)
MRN : [id number]  Tina Curtis is a 89 y.o. (04/19/1927) female who presents with chief complaint of  Chief Complaint  Patient presents with   Follow-up    ultrasound  .  History of Present Illness: Patient returns today in follow up of her dizziness with a carotid duplex today.  She continues to have very poor energy and she has become depressed but she does not feel like doing anything.  She is dizzy all the time.  She has had several tests that have been unrevealing.  We did a carotid duplex today which showed essentially no evidence of stenosis in either internal carotid artery.  Both vertebral arteries demonstrate antegrade flow with normal flow hemodynamics seen in both subclavian arteries.  Current Outpatient Medications  Medication Sig Dispense Refill   allopurinol (ZYLOPRIM) 100 MG tablet      anastrozole (ARIMIDEX) 1 MG tablet Take 1 mg by mouth daily.     aspirin 81 MG EC tablet Take by mouth.     citalopram (CELEXA) 10 MG tablet Take 10 mg by mouth daily.      clopidogrel (PLAVIX) 75 MG tablet TAKE 1 TABLET BY MOUTH EVERY DAY 30 tablet 1   cyanocobalamin (VITAMIN B12) 1000 MCG tablet Take by mouth.     docusate sodium (COLACE) 100 MG capsule Take 100 mg by mouth daily as needed for mild constipation.     DULoxetine (CYMBALTA) 60 MG capsule Take 60 mg by mouth daily.      ergocalciferol (VITAMIN D2) 1.25 MG (50000 UT) capsule Take 1 capsule by mouth once a week.     furosemide (LASIX) 20 MG tablet Take 20 mg by mouth daily.      gabapentin (NEURONTIN) 300 MG capsule Take 300 mg by mouth at bedtime.     hydrALAZINE (APRESOLINE) 25 MG tablet Take 1 tablet by mouth 3 (three) times daily.     loratadine (CLARITIN) 10 MG tablet Take 10 mg by mouth daily as needed for allergies. AM     metoprolol succinate (TOPROL-XL) 25 MG 24 hr tablet Take 37.5 mg by mouth at bedtime.      simvastatin (ZOCOR) 20 MG tablet Take 20 mg by mouth every evening. PM     No current  facility-administered medications for this visit.    Past Medical History:  Diagnosis Date   Arthritis    Gout   Chronic kidney disease    Diarrhea    Hypercholesteremia    Hypertension    Neuropathy    feet and lower legs   Seasonal allergies    Skin cancer    face    Past Surgical History:  Procedure Laterality Date   ABDOMINAL HYSTERECTOMY  2000   APPENDECTOMY     BREAST BIOPSY Right 09/19/2019   affirm bx of calcs UOQ, x marker, path pending   BREAST BIOPSY Right 09/19/2019   [place] bx of mass,heart marker, path pending   BREAST BIOPSY Right 09/19/2019   [place] bx of LN, coil marker, path pending   CATARACT EXTRACTION Left    CATARACT EXTRACTION W/PHACO Right 10/24/2014   Procedure: CATARACT EXTRACTION PHACO AND INTRAOCULAR LENS PLACEMENT ([place]);  Surgeon: Leandrew Koyanagi, MD;  Location: [facility];  Service: Ophthalmology;  Laterality: Right;   ESOPHAGOGASTRODUODENOSCOPY N/A 03/07/2018   Procedure: ESOPHAGOGASTRODUODENOSCOPY (EGD);  Surgeon: Lin Landsman, MD;  Location: [facility];  Service: Gastroenterology;  Laterality: N/A;   MASTECTOMY MODIFIED RADICAL Right 10/13/2019   Procedure: MASTECTOMY MODIFIED RADICAL;  Surgeon: Robert Bellow, MD;  Location: [facility];  Service: General;  Laterality: Right;   RENAL ANGIOGRAPHY Right 04/11/2018   Procedure: RENAL ANGIOGRAPHY;  Surgeon: Algernon Huxley, MD;  Location: [facility];  Service: Cardiovascular;  Laterality: Right;   SIMPLE MASTECTOMY WITH AXILLARY SENTINEL NODE BIOPSY Left 10/13/2019   Procedure: SIMPLE MASTECTOMY TRUE CUT BIOPSY, SENTINEL NODE BIOPSY;  Surgeon: Robert Bellow, MD;  Location: [facility];  Service: General;  Laterality: Left;     Social History   Tobacco Use   Smoking status: Never   Smokeless tobacco: Never  Substance Use Topics   Alcohol use: No   Drug use: Never       Family History  Problem Relation Age of Onset   Hypertension Mother    Hypertension Father     Stroke Father    Breast cancer Daughter 10     Allergies  Allergen Reactions   Penicillins Anaphylaxis   Drug Ingredient [Zinc]      REVIEW OF SYSTEMS (Negative unless checked)  Constitutional: '[]'$ Weight loss  '[]'$ Fever  '[]'$ Chills Cardiac: '[]'$ Chest pain   '[]'$ Chest pressure   '[]'$ Palpitations   '[]'$ Shortness of breath when laying flat   '[]'$ Shortness of breath at rest   '[]'$ Shortness of breath with exertion. Vascular:  '[]'$ Pain in legs with walking   '[]'$ Pain in legs at rest   '[]'$ Pain in legs when laying flat   '[]'$ Claudication   '[]'$ Pain in feet when walking  '[]'$ Pain in feet at rest  '[]'$ Pain in feet when laying flat   '[]'$ History of DVT   '[]'$ Phlebitis   '[]'$ Swelling in legs   '[]'$ Varicose veins   '[]'$ Non-healing ulcers Pulmonary:   '[]'$ Uses home oxygen   '[]'$ Productive cough   '[]'$ Hemoptysis   '[]'$ Wheeze  '[]'$ COPD   '[]'$ Asthma Neurologic:  '[x]'$ Dizziness  '[]'$ Blackouts   '[]'$ Seizures   '[]'$ History of stroke   '[]'$ History of TIA  '[]'$ Aphasia   '[]'$ Temporary blindness   '[]'$ Dysphagia   '[]'$ Weakness or numbness in arms   '[]'$ Weakness or numbness in legs Musculoskeletal:  '[x]'$ Arthritis   '[]'$ Joint swelling   '[x]'$ Joint pain   '[]'$ Low back pain Hematologic:  '[]'$ Easy bruising  '[]'$ Easy bleeding   '[]'$ Hypercoagulable state   '[]'$ Anemic   Gastrointestinal:  '[]'$ Blood in stool   '[]'$ Vomiting blood  '[]'$ Gastroesophageal reflux/heartburn   '[]'$ Abdominal pain Genitourinary:  '[]'$ Chronic kidney disease   '[]'$ Difficult urination  '[]'$ Frequent urination  '[]'$ Burning with urination   '[]'$ Hematuria Skin:  '[]'$ Rashes   '[]'$ Ulcers   '[]'$ Wounds Psychological:  '[]'$ History of anxiety   '[x]'$  History of major depression.  Physical Examination  BP 115/66 (BP Location: Left Arm)   Pulse 95   Resp 18   Ht '5\' 2"'$  (1.575 m)   Wt 136 lb (61.7 kg)   BMI 24.87 kg/m  Gen:  WD/WN, NAD.  Appears younger than stated age Head: [place]/AT, No temporalis wasting. Ear/Nose/Throat: Hearing grossly intact, nares w/o erythema or drainage Eyes: Conjunctiva clear. Sclera non-icteric Neck: Supple.  Trachea midline Pulmonary:  Good  air movement, no use of accessory muscles.  Cardiac: RRR, no JVD Vascular:  Vessel Right Left  Radial Palpable Palpable               Musculoskeletal: M/S 5/5 throughout.  No deformity or atrophy.  No edema. Neurologic: Sensation grossly intact in extremities.  Symmetrical.  Speech is fluent.  Psychiatric: Judgment intact, Mood & affect appropriate for pt's clinical situation. Dermatologic: No rashes or ulcers noted.  No cellulitis or open wounds.  Labs Recent Results (from the past 2160 hour(s))  POCT urinalysis dipstick     Status: Abnormal   Collection Time: 03/18/22  4:14 PM  Result Value Ref Range   Color, UA yellow yellow   Clarity, UA clear clear   Glucose, UA negative negative mg/dL   Bilirubin, UA negative negative   Ketones, POC UA negative negative mg/dL   Spec Grav, UA 1.010 1.010 - 1.025   Blood, UA negative negative   pH, UA 5.5 5.0 - 8.0   Protein Ur, POC negative negative mg/dL   Urobilinogen, UA 0.2 0.2 or 1.0 E.U./dL   Nitrite, UA Negative Negative   Leukocytes, UA Trace (A) Negative  CBC with Differential     Status: Abnormal   Collection Time: 03/18/22  5:42 PM  Result Value Ref Range   WBC 10.5 4.0 - 10.5 K/uL   RBC 4.06 3.87 - 5.11 MIL/uL   Hemoglobin 12.6 12.0 - 15.0 g/dL   HCT 37.6 36.0 - 46.0 %   MCV 92.6 80.0 - 100.0 fL   MCH 31.0 26.0 - 34.0 pg   MCHC 33.5 30.0 - 36.0 g/dL   RDW 13.6 11.5 - 15.5 %   Platelets 146 (L) 150 - 400 K/uL   nRBC 0.0 0.0 - 0.2 %   Neutrophils Relative % 77 %   Neutro Abs 8.1 (H) 1.7 - 7.7 K/uL   Lymphocytes Relative 13 %   Lymphs Abs 1.4 0.7 - 4.0 K/uL   Monocytes Relative 8 %   Monocytes Absolute 0.8 0.1 - 1.0 K/uL   Eosinophils Relative 1 %   Eosinophils Absolute 0.1 0.0 - 0.5 K/uL   Basophils Relative 1 %   Basophils Absolute 0.1 0.0 - 0.1 K/uL   Immature Granulocytes 0 %   Abs Immature Granulocytes 0.04 0.00 - 0.07 K/uL    Comment: Performed at [facility], [address]  [place], [place] [address]  Comprehensive metabolic panel     Status: Abnormal   Collection Time: 03/18/22  5:42 PM  Result Value Ref Range   Sodium 137 135 - 145 mmol/L   Potassium 3.7 3.5 - 5.1 mmol/L   Chloride 104 98 - 111 mmol/L   CO2 24 22 - 32 mmol/L   Glucose, Bld 113 (H) 70 - 99 mg/dL    Comment: Glucose reference range applies only to samples taken after fasting for at least 8 hours.   BUN 52 (H) 8 - 23 mg/dL   Creatinine, Ser 1.42 (H) 0.44 - 1.00 mg/dL   Calcium 9.8 8.9 - 10.3 mg/dL   Total Protein 7.6 6.5 - 8.1 g/dL   Albumin 4.2 3.5 - 5.0 g/dL   AST 29 15 - 41 U/L   ALT 20 0 - 44 U/L   Alkaline Phosphatase 68 38 - 126 U/L   Total Bilirubin 0.8 0.3 - 1.2 mg/dL   GFR, Estimated 35 (L) >60 mL/min    Comment: (NOTE) Calculated using the CKD-EPI Creatinine Equation (2021)    Anion gap 9 5 - 15    Comment: Performed at [facility], [place], [place] [address]  Troponin I (High Sensitivity)     Status: None   Collection Time: 03/18/22  5:42 PM  Result Value Ref Range   Troponin I (High Sensitivity) 16 <18 ng/L    Comment: (NOTE) Elevated high sensitivity troponin I (hsTnI) values and significant  changes across serial measurements may suggest ACS but many other  chronic and acute  conditions are known to elevate hsTnI results.  Refer to the "Links" section for chest pain algorithms and additional  guidance. Performed at [facility], [address] [place], [place] [address]   Lipase, blood     Status: Abnormal   Collection Time: 03/18/22  5:42 PM  Result Value Ref Range   Lipase 58 (H) 11 - 51 U/L    Comment: Performed at [facility], [address] [place], [place] [address]  SARS Coronavirus 2 by RT PCR (hospital order, performed in [facility] hospital lab) *cepheid single result test* Anterior Nasal Swab     Status: None   Collection Time: 03/18/22  5:42 PM   Specimen: Anterior Nasal Swab  Result Value Ref Range    SARS Coronavirus 2 by RT PCR NEGATIVE NEGATIVE    Comment: (NOTE) SARS-CoV-2 target nucleic acids are NOT DETECTED.  The SARS-CoV-2 RNA is generally detectable in upper and lower respiratory specimens during the acute phase of infection. The lowest concentration of SARS-CoV-2 viral copies this assay can detect is 250 copies / mL. A negative result does not preclude SARS-CoV-2 infection and should not be used as the sole basis for treatment or other patient management decisions.  A negative result may occur with improper specimen collection / handling, submission of specimen other than nasopharyngeal swab, presence of viral mutation(s) within the areas targeted by this assay, and inadequate number of viral copies (<250 copies / mL). A negative result must be combined with clinical observations, patient history, and epidemiological information.  Fact Sheet for Patients:   https://www.patel.info/  Fact Sheet for Healthcare Providers: https://hall.com/  This test is not yet approved or  cleared by the [place] FDA and has been authorized for detection and/or diagnosis of SARS-CoV-2 by FDA under an Emergency Use Authorization (EUA).  This EUA will remain in effect (meaning this test can be used) for the duration of the COVID-19 declaration under Section 564(b)(1) of the Act, 21 U.S.C. section 360bbb-3(b)(1), unless the authorization is terminated or revoked sooner.  Performed at [facility], [address], [place], [place] [address]   Urine Culture     Status: Abnormal   Collection Time: 03/18/22  8:01 PM   Specimen: Urine, Clean Catch  Result Value Ref Range   Specimen Description      URINE, CLEAN CATCH Performed at [facility], [address], [place], [place] [address]    Special Requests      NONE Performed at [facility], [address] [place],  [address]    Culture MULTIPLE SPECIES  PRESENT, SUGGEST RECOLLECTION (A)    Report Status 03/20/2022 FINAL   Urinalysis, Routine w reflex microscopic     Status: Abnormal   Collection Time: 03/18/22  8:02 PM  Result Value Ref Range   Color, Urine STRAW (A) YELLOW   APPearance CLEAR (A) CLEAR   Specific Gravity, Urine 1.015 1.005 - 1.030   pH 5.0 5.0 - 8.0   Glucose, UA NEGATIVE NEGATIVE mg/dL   Hgb urine dipstick NEGATIVE NEGATIVE   Bilirubin Urine NEGATIVE NEGATIVE   Ketones, ur NEGATIVE NEGATIVE mg/dL   Protein, ur NEGATIVE NEGATIVE mg/dL   Nitrite NEGATIVE NEGATIVE   Leukocytes,Ua TRACE (A) NEGATIVE   RBC / HPF 0-5 0 - 5 RBC/hpf   WBC, UA 6-10 0 - 5 WBC/hpf   Bacteria, UA NONE SEEN NONE SEEN   Squamous Epithelial / HPF 0-5 0 - 5    Comment: Performed at  [place], [place] [address]  Troponin I (High Sensitivity)     Status: None   Collection Time: 03/18/22  8:02 PM  Result Value Ref Range   Troponin I (High Sensitivity) 16 <18 ng/L    Comment: (NOTE) Elevated high sensitivity troponin I (hsTnI) values and significant  changes across serial measurements may suggest ACS but many other  chronic and acute conditions are known to elevate hsTnI results.  Refer to the "Links" section for chest pain algorithms and additional  guidance. Performed at [facility], [address] [place], [place] [address]   25-Hydroxyvitamin D Lcms D2+D3     Status: None   Collection Time: 05/13/22  9:49 AM  Result Value Ref Range   25-Hydroxy, Vitamin D 32 ng/mL    Comment: (NOTE) Reference Range: All Ages: Target levels 30 - 100    25-Hydroxy, Vitamin D-2 1.0 ng/mL    Comment: (NOTE) This test was developed and its performance characteristics determined by [facility]. It has not been cleared or approved by the Food and Drug Administration.    25-Hydroxy, Vitamin D-3 31 ng/mL    Comment: (NOTE) This test was developed and its performance characteristics determined by [facility]. It  has not been cleared or approved by the Food and Drug Administration. Performed At: [place], [place] [id number] Jake Bathe F MD [id number]   Cancer antigen 15-3     Status: Abnormal   Collection Time: 05/13/22  9:49 AM  Result Value Ref Range   CA 15-3 36.5 (H) 0.0 - 25.0 U/mL    Comment: (NOTE) Roche [employer]ostics Electrochemiluminescence Immunoassay (ECLIA) Values obtained with different assay methods or kits cannot be used interchangeably.  Results cannot be interpreted as absolute evidence of the presence or absence of malignant disease. Performed At: [facility] [place], [place] [id number] Rush Farmer MD OE:[id number]   Cancer antigen 27.29     Status: Abnormal   Collection Time: 05/13/22  9:49 AM  Result Value Ref Range   CA 27.29 40.3 (H) 0.0 - 38.6 U/mL    Comment: (NOTE) Siemens Centaur Immunochemiluminometric Methodology [facility]) Values obtained with different assay methods or kits cannot be used interchangeably. Results cannot be interpreted as absolute evidence of the presence or absence of malignant disease. Performed At: [facility] [place], [place] [id number] Rush Farmer MD CV:[id number]   CEA     Status: Abnormal   Collection Time: 05/13/22  9:49 AM  Result Value Ref Range   CEA 4.9 (H) 0.0 - 4.7 ng/mL    Comment: (NOTE)                             Nonsmokers          <3.9                             Smokers             <5.6 Roche [employer]ostics Electrochemiluminescence Immunoassay (ECLIA) Values obtained with different assay methods or kits cannot be used interchangeably.  Results cannot be interpreted as absolute evidence of the presence or absence of malignant disease. Performed At: [facility] [place], [place] [id number] Rush Farmer MD [id number]   Comprehensive metabolic panel     Status: Abnormal   Collection Time: 05/13/22  9:49 AM  Result Value Ref Range   Sodium 139 135 - 145 mmol/L   Potassium 4.1 3.5 - 5.1 mmol/L   Chloride 105 98 - 111 mmol/L   CO2 26 22 - 32 mmol/L   Glucose, Bld 238 (H) 70 - 99 mg/dL    Comment: Glucose reference range applies only to samples taken after fasting for at least 8 hours.   BUN 36 (H) 8 - 23 mg/dL   Creatinine, Ser 1.36 (H) 0.44 - 1.00 mg/dL   Calcium 9.2 8.9 - 10.3 mg/dL   Total Protein 7.7 6.5 - 8.1 g/dL   Albumin 4.0 3.5 - 5.0 g/dL   AST 21 15 - 41 U/L   ALT 13 0 - 44 U/L   Alkaline Phosphatase 94 38 - 126 U/L   Total Bilirubin 0.7 0.3 - 1.2 mg/dL   GFR, Estimated 36 (L) >60 mL/min    Comment: (NOTE) Calculated using the CKD-EPI Creatinine Equation (2021)    Anion gap 8 5 - 15    Comment: Performed at [facility], [address] [place], [place] [address]  CBC with Differential     Status: None   Collection Time: 05/13/22  9:49 AM  Result Value Ref Range   WBC 7.2 4.0 - 10.5 K/uL   RBC 3.94 3.87 - 5.11 MIL/uL   Hemoglobin 12.4 12.0 - 15.0 g/dL   HCT 37.1 36.0 - 46.0 %   MCV 94.2 80.0 - 100.0 fL   MCH 31.5 26.0 - 34.0 pg   MCHC 33.4 30.0 - 36.0 g/dL   RDW 13.4 11.5 - 15.5 %   Platelets 189 150 - 400 K/uL   nRBC 0.0 0.0 - 0.2 %   Neutrophils Relative % 61 %   Neutro Abs 4.4 1.7 - 7.7 K/uL   Lymphocytes Relative 19 %   Lymphs Abs 1.4 0.7 - 4.0 K/uL   Monocytes Relative 11 %   Monocytes Absolute 0.8 0.1 - 1.0 K/uL   Eosinophils Relative 8 %   Eosinophils Absolute 0.5 0.0 - 0.5 K/uL   Basophils Relative 1 %   Basophils Absolute 0.1 0.0 - 0.1 K/uL   Immature Granulocytes 0 %   Abs Immature Granulocytes 0.03 0.00 - 0.07 K/uL    Comment: Performed at [facility], [address], [place], [place] [address]    Radiology No results found.  Assessment/Plan  Renovascular hypertension Blood pressure control has been suboptimal recently.  Her last renal artery duplex was stable.  To be checked again in the next few months with renal artery  duplex.  Renal artery stenosis (HCC) Status post stenting in the past.  Flow was good at last duplex.  Scheduled to have another duplex in the coming months.  Chronic kidney disease, stage IV (severe) (HCC) Avoid contrast unless absolutely necessary.  Dizziness and giddiness We did a carotid duplex today which showed essentially no evidence of stenosis in either internal carotid artery.  Both vertebral arteries demonstrate antegrade flow with normal flow hemodynamics seen in both subclavian arteries.  I do not see a vascular cause of her dizziness or other symptoms at this point.  I will defer further workup to her primary care physician.    Leotis Pain, MD  06/02/2022 12:51 PM    This note was created with Dragon medical transcription system.  Any errors from dictation are purely unintentional

## 2022-06-02 NOTE — Assessment & Plan Note (Signed)
Status post stenting in the past.  Flow was good at last duplex.  Scheduled to have another duplex in the coming months.

## 2022-06-02 NOTE — Assessment & Plan Note (Signed)
We did a carotid duplex today which showed essentially no evidence of stenosis in either internal carotid artery.  Both vertebral arteries demonstrate antegrade flow with normal flow hemodynamics seen in both subclavian arteries.  I do not see a vascular cause of her dizziness or other symptoms at this point.  I will defer further workup to her primary care physician.

## 2022-06-02 NOTE — Assessment & Plan Note (Signed)
Blood pressure control has been suboptimal recently.  Her last renal artery duplex was stable.  To be checked again in the next few months with renal artery duplex.

## 2022-06-04 ENCOUNTER — Other Ambulatory Visit: Payer: Self-pay | Admitting: Internal Medicine

## 2022-06-05 ENCOUNTER — Emergency Department: Payer: Medicare PPO

## 2022-06-05 ENCOUNTER — Encounter: Payer: Self-pay | Admitting: Student

## 2022-06-05 ENCOUNTER — Inpatient Hospital Stay
Admission: EM | Admit: 2022-06-05 | Discharge: 2022-06-10 | DRG: 193 | Disposition: A | Discharge status: Home Health | Payer: Medicare PPO | Attending: Internal Medicine | Admitting: Internal Medicine

## 2022-06-05 ENCOUNTER — Ambulatory Visit: Payer: Medicare PPO | Admitting: Student

## 2022-06-05 ENCOUNTER — Other Ambulatory Visit: Payer: Self-pay

## 2022-06-05 VITALS — BP 126/68 | HR 84 | Temp 98.1°F | Ht 62.0 in | Wt 137.0 lb

## 2022-06-05 DIAGNOSIS — I951 Orthostatic hypotension: Secondary | ICD-10-CM

## 2022-06-05 DIAGNOSIS — R519 Headache, unspecified: Secondary | ICD-10-CM

## 2022-06-05 DIAGNOSIS — Z8249 Family history of ischemic heart disease and other diseases of the circulatory system: Secondary | ICD-10-CM

## 2022-06-05 DIAGNOSIS — I272 Pulmonary hypertension, unspecified: Secondary | ICD-10-CM | POA: Diagnosis present

## 2022-06-05 DIAGNOSIS — E8770 Fluid overload, unspecified: Secondary | ICD-10-CM | POA: Diagnosis not present

## 2022-06-05 DIAGNOSIS — I129 Hypertensive chronic kidney disease with stage 1 through stage 4 chronic kidney disease, or unspecified chronic kidney disease: Secondary | ICD-10-CM | POA: Diagnosis present

## 2022-06-05 DIAGNOSIS — I10 Essential (primary) hypertension: Secondary | ICD-10-CM | POA: Insufficient documentation

## 2022-06-05 DIAGNOSIS — M1990 Unspecified osteoarthritis, unspecified site: Secondary | ICD-10-CM | POA: Diagnosis present

## 2022-06-05 DIAGNOSIS — J439 Emphysema, unspecified: Secondary | ICD-10-CM | POA: Diagnosis present

## 2022-06-05 DIAGNOSIS — Z823 Family history of stroke: Secondary | ICD-10-CM

## 2022-06-05 DIAGNOSIS — R42 Dizziness and giddiness: Secondary | ICD-10-CM

## 2022-06-05 DIAGNOSIS — J189 Pneumonia, unspecified organism: Secondary | ICD-10-CM | POA: Diagnosis not present

## 2022-06-05 DIAGNOSIS — D631 Anemia in chronic kidney disease: Secondary | ICD-10-CM | POA: Diagnosis present

## 2022-06-05 DIAGNOSIS — R55 Syncope and collapse: Secondary | ICD-10-CM

## 2022-06-05 DIAGNOSIS — Z85828 Personal history of other malignant neoplasm of skin: Secondary | ICD-10-CM

## 2022-06-05 DIAGNOSIS — E860 Dehydration: Secondary | ICD-10-CM

## 2022-06-05 DIAGNOSIS — F419 Anxiety disorder, unspecified: Secondary | ICD-10-CM | POA: Diagnosis present

## 2022-06-05 DIAGNOSIS — N179 Acute kidney failure, unspecified: Secondary | ICD-10-CM

## 2022-06-05 DIAGNOSIS — M109 Gout, unspecified: Secondary | ICD-10-CM | POA: Diagnosis present

## 2022-06-05 DIAGNOSIS — Z79899 Other long term (current) drug therapy: Secondary | ICD-10-CM

## 2022-06-05 DIAGNOSIS — Z803 Family history of malignant neoplasm of breast: Secondary | ICD-10-CM

## 2022-06-05 DIAGNOSIS — N1832 Chronic kidney disease, stage 3b: Secondary | ICD-10-CM | POA: Diagnosis present

## 2022-06-05 DIAGNOSIS — E872 Acidosis, unspecified: Secondary | ICD-10-CM | POA: Diagnosis present

## 2022-06-05 DIAGNOSIS — Z7902 Long term (current) use of antithrombotics/antiplatelets: Secondary | ICD-10-CM

## 2022-06-05 DIAGNOSIS — E7800 Pure hypercholesterolemia, unspecified: Secondary | ICD-10-CM | POA: Diagnosis present

## 2022-06-05 DIAGNOSIS — F32A Depression, unspecified: Secondary | ICD-10-CM | POA: Insufficient documentation

## 2022-06-05 DIAGNOSIS — Z880 Allergy status to penicillin: Secondary | ICD-10-CM

## 2022-06-05 DIAGNOSIS — Z79811 Long term (current) use of aromatase inhibitors: Secondary | ICD-10-CM

## 2022-06-05 DIAGNOSIS — E871 Hypo-osmolality and hyponatremia: Secondary | ICD-10-CM | POA: Diagnosis present

## 2022-06-05 DIAGNOSIS — N184 Chronic kidney disease, stage 4 (severe): Secondary | ICD-10-CM | POA: Diagnosis present

## 2022-06-05 DIAGNOSIS — Z7982 Long term (current) use of aspirin: Secondary | ICD-10-CM

## 2022-06-05 DIAGNOSIS — I2510 Atherosclerotic heart disease of native coronary artery without angina pectoris: Secondary | ICD-10-CM | POA: Diagnosis present

## 2022-06-05 DIAGNOSIS — J432 Centrilobular emphysema: Secondary | ICD-10-CM | POA: Diagnosis present

## 2022-06-05 DIAGNOSIS — G629 Polyneuropathy, unspecified: Secondary | ICD-10-CM | POA: Diagnosis present

## 2022-06-05 DIAGNOSIS — Z1152 Encounter for screening for COVID-19: Secondary | ICD-10-CM

## 2022-06-05 DIAGNOSIS — Z853 Personal history of malignant neoplasm of breast: Secondary | ICD-10-CM

## 2022-06-05 DIAGNOSIS — Z66 Do not resuscitate: Secondary | ICD-10-CM | POA: Diagnosis present

## 2022-06-05 DIAGNOSIS — J9621 Acute and chronic respiratory failure with hypoxia: Secondary | ICD-10-CM | POA: Insufficient documentation

## 2022-06-05 DIAGNOSIS — E876 Hypokalemia: Secondary | ICD-10-CM | POA: Diagnosis not present

## 2022-06-05 LAB — CBC
HCT: 39.4 % (ref 36.0–46.0)
Hemoglobin: 13.4 g/dL (ref 12.0–15.0)
MCH: 31.4 pg (ref 26.0–34.0)
MCHC: 34 g/dL (ref 30.0–36.0)
MCV: 92.3 fL (ref 80.0–100.0)
Platelets: 190 10*3/uL (ref 150–400)
RBC: 4.27 MIL/uL (ref 3.87–5.11)
RDW: 13.3 % (ref 11.5–15.5)
WBC: 9.7 10*3/uL (ref 4.0–10.5)
nRBC: 0 % (ref 0.0–0.2)

## 2022-06-05 LAB — TROPONIN I (HIGH SENSITIVITY)
Troponin I (High Sensitivity): 26 ng/L — ABNORMAL HIGH (ref <18)
Troponin I (High Sensitivity): 27 ng/L — ABNORMAL HIGH (ref <18)

## 2022-06-05 LAB — BASIC METABOLIC PANEL
Anion gap: 12 (ref 5–15)
BUN: 86 mg/dL — ABNORMAL HIGH (ref 8–23)
CO2: 22 mmol/L (ref 22–32)
Calcium: 9.8 mg/dL (ref 8.9–10.3)
Chloride: 98 mmol/L (ref 98–111)
Creatinine, Ser: 1.59 mg/dL — ABNORMAL HIGH (ref 0.44–1.00)
GFR, Estimated: 30 mL/min — ABNORMAL LOW (ref >60)
Glucose, Bld: 200 mg/dL — ABNORMAL HIGH (ref 70–99)
Potassium: 4 mmol/L (ref 3.5–5.1)
Sodium: 132 mmol/L — ABNORMAL LOW (ref 135–145)

## 2022-06-05 LAB — URINALYSIS, ROUTINE W REFLEX MICROSCOPIC
Bilirubin Urine: NEGATIVE
Glucose, UA: NEGATIVE mg/dL
Hgb urine dipstick: NEGATIVE
Ketones, ur: NEGATIVE mg/dL
Leukocytes,Ua: NEGATIVE
Nitrite: NEGATIVE
Protein, ur: NEGATIVE mg/dL
Specific Gravity, Urine: 1.014 (ref 1.005–1.030)
pH: 5 (ref 5.0–8.0)

## 2022-06-05 LAB — HEPATIC FUNCTION PANEL
ALT: 19 U/L (ref 0–44)
AST: 27 U/L (ref 15–41)
Albumin: 4 g/dL (ref 3.5–5.0)
Alkaline Phosphatase: 95 U/L (ref 38–126)
Bilirubin, Direct: <0.1 mg/dL (ref 0.0–0.2)
Total Bilirubin: 0.7 mg/dL (ref 0.3–1.2)
Total Protein: 7.4 g/dL (ref 6.5–8.1)

## 2022-06-05 MED ORDER — SODIUM CHLORIDE 0.9 % IV BOLUS
500.0000 mL | Freq: Once | INTRAVENOUS | Status: AC
  Administered 2022-06-05: 500 mL via INTRAVENOUS

## 2022-06-05 NOTE — Patient Instructions (Addendum)
I would like to collect labs on Monday. They will come to your house to collect them.   Urine sodium, urine creatinine, Urinalysis with culture. CBC, CMP.   If your symptoms are worse, you should go to the hospital for emergent evaluation.   You will receive a call in the next week to schedule the head imaging I have ordered. Please keep an eye for this phone call.   Take Acetaminophen 1000 mg every 8 hours for your headaches. Try a heating pad for your head and neck.

## 2022-06-05 NOTE — Progress Notes (Signed)
Location:  [facility] [place] of Service:   [facility]  Provider: Unk Lightning  Code Status: DNR Goals of Care:     03/18/2022    4:46 PM  Advanced Directives  Does Patient Have a Medical Advance Directive? No     Chief Complaint  Patient presents with   Acute Visit    Possible UTI. Weakness and Lower Abdominal pain. Finished Cipro and Prednisone yesterday.  Janci performed U/A dip on patient this morning in residents home Leuk:Mod,Nit.Neg,Uro 0.2,PH 5.0,Blood trace,SG 1.015,Ket trace,Bili Neg.,Glucose Neg.    HPI: Patient is a 89 y.o. female seen today for an acute visit for headaches. For the last few weeks she has had more headaches. For the last couple of weeks she has been very dizzy. She was seen by EMT and the vital signs have continued to be normal. She was on prednisone and ciprofloxacin for one week. She told Dr. Baldemar Lenis that she had a UTI because she was urinating so much.  She has been drinking well. Has boost every day. She has had a low appetite. Her A1c increased to 8.2 and she has it down to 7 now.   She has a headache right now. She has taken some tylenol  for her headache. She has not had relief from the headache.   Congestion isn't much better after prednisone and ciprofloxacin.  Her dizziness has partially been the room spinning as well.   She lives at home alone. Her husband passed 5 years ago. He has no fevers or chills.  Past Medical History:  Diagnosis Date   Arthritis    Gout   Chronic kidney disease    Diarrhea    Hypercholesteremia    Hypertension    Neuropathy    feet and lower legs   Seasonal allergies    Skin cancer    face    Past Surgical History:  Procedure Laterality Date   ABDOMINAL HYSTERECTOMY  2000   APPENDECTOMY     BREAST BIOPSY Right 09/19/2019   affirm bx of calcs UOQ, x marker, path pending   BREAST BIOPSY Right 09/19/2019   [place] bx of mass,heart marker, path pending   BREAST BIOPSY Right 09/19/2019   [place] bx of LN,  coil marker, path pending   CATARACT EXTRACTION Left    CATARACT EXTRACTION W/PHACO Right 10/24/2014   Procedure: CATARACT EXTRACTION PHACO AND INTRAOCULAR LENS PLACEMENT ([place]);  Surgeon: Leandrew Koyanagi, MD;  Location: [place];  Service: Ophthalmology;  Laterality: Right;   ESOPHAGOGASTRODUODENOSCOPY N/A 03/07/2018   Procedure: ESOPHAGOGASTRODUODENOSCOPY (EGD);  Surgeon: Lin Landsman, MD;  Location: [facility] ENDOSCOPY;  Service: Gastroenterology;  Laterality: N/A;   MASTECTOMY MODIFIED RADICAL Right 10/13/2019   Procedure: MASTECTOMY MODIFIED RADICAL;  Surgeon: Robert Bellow, MD;  Location: [facility] ORS;  Service: General;  Laterality: Right;   RENAL ANGIOGRAPHY Right 04/11/2018   Procedure: RENAL ANGIOGRAPHY;  Surgeon: Algernon Huxley, MD;  Location: [facility];  Service: Cardiovascular;  Laterality: Right;   SIMPLE MASTECTOMY WITH AXILLARY SENTINEL NODE BIOPSY Left 10/13/2019   Procedure: SIMPLE MASTECTOMY TRUE CUT BIOPSY, SENTINEL NODE BIOPSY;  Surgeon: Robert Bellow, MD;  Location: [facility] ORS;  Service: General;  Laterality: Left;    Allergies  Allergen Reactions   Penicillins Anaphylaxis   Drug Ingredient [Zinc]     Outpatient Encounter Medications as of 06/05/2022  Medication Sig   allopurinol (ZYLOPRIM) 100 MG tablet    anastrozole (ARIMIDEX) 1 MG tablet  TAKE 1 TABLET(1 MG) BY MOUTH DAILY. DO NOT. START IF RASH IS NOT BETTER   aspirin 81 MG EC tablet Take by mouth.   citalopram (CELEXA) 10 MG tablet Take 10 mg by mouth daily.    clopidogrel (PLAVIX) 75 MG tablet TAKE 1 TABLET BY MOUTH EVERY DAY   cyanocobalamin (VITAMIN B12) 1000 MCG tablet Take by mouth.   docusate sodium (COLACE) 100 MG capsule Take 100 mg by mouth daily as needed for mild constipation.   DULoxetine (CYMBALTA) 60 MG capsule Take 60 mg by mouth daily.    ergocalciferol (VITAMIN D2) 1.25 MG (50000 UT) capsule Take 1 capsule by mouth once a week.   furosemide (LASIX) 20 MG tablet Take 20  mg by mouth daily.    gabapentin (NEURONTIN) 300 MG capsule Take 300 mg by mouth at bedtime.   hydrALAZINE (APRESOLINE) 25 MG tablet Take 1 tablet by mouth 3 (three) times daily.   loratadine (CLARITIN) 10 MG tablet Take 10 mg by mouth daily as needed for allergies. AM   metoprolol succinate (TOPROL-XL) 25 MG 24 hr tablet Take 37.5 mg by mouth at bedtime.    simvastatin (ZOCOR) 20 MG tablet Take 20 mg by mouth every evening. PM   No facility-administered encounter medications on file as of 06/05/2022.    Review of Systems:  Review of Systems  Health Maintenance  Topic Date Due   Medicare Annual Wellness (AWV)  Never done   Zoster Vaccines- Shingrix (1 of 2) Never done   DTaP/Tdap/Td (3 - Td or Tdap) 08/06/2020   COVID-19 Vaccine (5 - 2023-24 season) 04/23/2022   Pneumonia Vaccine 76+ Years old  Completed   INFLUENZA VACCINE  Completed   DEXA SCAN  Completed   HPV VACCINES  Aged Out    Physical Exam: Vitals:   06/05/22 1537  BP: 126/68  Pulse: 84  Temp: 98.1 F (36.7 C)  SpO2: 98%  Weight: 137 lb (62.1 kg)  Height: '5\' 2"'$  (1.575 m)   Body mass index is 25.06 kg/m. Physical Exam Vitals reviewed.  Constitutional:      Appearance: Normal appearance.  Cardiovascular:     Rate and Rhythm: Normal rate.     Pulses: Normal pulses.  Pulmonary:     Effort: Pulmonary effort is normal.  Abdominal:     General: Bowel sounds are normal.     Palpations: Abdomen is soft.  Musculoskeletal:     Comments: 4/5 R lower extremity. 5/5 LLE. Bilateral upper grip strength 3/5. Patient almost fainted while standing waiting for her AVS   Neurological:     Mental Status: She is alert.     Labs reviewed: Basic Metabolic Panel: Recent Labs    01/09/22 1019 03/18/22 1742 05/13/22 0949  NA 140 137 139  K 3.7 3.7 4.1  CL 104 104 105  CO2 '27 24 26  '$ GLUCOSE 167* 113* 238*  BUN 42* 52* 36*  CREATININE 1.59* 1.42* 1.36*  CALCIUM 9.5 9.8 9.2   Liver Function Tests: Recent Labs     01/09/22 1019 03/18/22 1742 05/13/22 0949  AST '29 29 21  '$ ALT '19 20 13  '$ ALKPHOS 83 68 94  BILITOT 0.6 0.8 0.7  PROT 7.9 7.6 7.7  ALBUMIN 4.1 4.2 4.0   Recent Labs    03/18/22 1742  LIPASE 58*   No results for input(s): "AMMONIA" in the last 8760 hours. CBC: Recent Labs    01/09/22 1019 03/18/22 1742 05/13/22 0949  WBC 6.3 10.5 7.2  NEUTROABS  3.8 8.1* 4.4  HGB 13.1 12.6 12.4  HCT 38.8 37.6 37.1  MCV 95.8 92.6 94.2  PLT 151 146* 189   Lipid Panel: No results for input(s): "CHOL", "HDL", "LDLCALC", "TRIG", "CHOLHDL", "LDLDIRECT" in the last 8760 hours. No results found for: "HGBA1C"  Procedures since last visit: No results found.  Assessment/Plan Acute intractable headache, unspecified headache type - Plan: CT HEAD WO CONTRAST (5MM)  Near syncope  Dizziness Patient with intractable headache. Initially attempted to discuss conservative management with outpatient imaging, however, patient had a near-syncopal event when exiting  and patient had to take a seat. Patient has had worsened headaches for the last 2 days, increased dizziness, decreased appetite. Concern for CVA vs cardiac arrhythmia and these cannot be evaluated in the clinic today.   Labs/tests ordered:  * No order type specified * Next appt:  Visit date not found    I spent 30 minutes in face to face time and an additional 30 minutes for coordination of care.   Tomasa Rand, MD, [facility] [phone]

## 2022-06-05 NOTE — ED Triage Notes (Signed)
Pt to ED via [place] from [facility]. Pt ahs been weak and lethargic for 2 wks. Pt seen by PCP and placed on cipro and prednisone for possible UTI. Pt finished medication 2 days and still feeling weak. Pt with hx breast cancer on right side.   CBG 195 BP 168/79 98% RA HR 90

## 2022-06-05 NOTE — ED Provider Notes (Signed)
[facility] Provider Note    Event Date/Time   First MD Initiated Contact with Patient 06/05/22 1943     (approximate)   History   Weakness   HPI  Tina Curtis is a 89 y.o. female presents to the ER for evaluation of weakness and unsteadiness.  Has been having fatigue.  She denies any chest pain or pressure.  Was in clinic earlier today for similar symptoms had near syncopal event was sent to the ER for further workup.  States she has been trying to drink plenty of fluid.  Was recently put on Cipro as well as prednisone.     Physical Exam   Triage Vital Signs: ED Triage Vitals  Enc Vitals Group     BP 06/05/22 1822 (!) 161/78     Pulse Rate 06/05/22 1822 92     Resp 06/05/22 1822 18     Temp 06/05/22 1822 98.6 F (37 C)     Temp Source 06/05/22 1822 Oral     SpO2 06/05/22 1822 95 %     Weight --      Height --      Head Circumference --      Peak Flow --      Pain Score 06/05/22 1823 0     Pain Loc --      Pain Edu? --      Excl. in [place]? --     Most recent vital signs: Vitals:   06/05/22 2330 06/06/22 0000  BP: (!) 158/67 (!) 151/72  Pulse: 68 70  Resp: 16 20  Temp:  98.6 F (37 C)  SpO2: 93% 93%     Constitutional: Alert  Eyes: Conjunctivae are normal.  Head: Atraumatic. Nose: No congestion/rhinnorhea. Mouth/Throat: Mucous membranes are moist.   Neck: Painless ROM.  Cardiovascular:   Good peripheral circulation. Respiratory: Normal respiratory effort.  No retractions.  Gastrointestinal: Soft and nontender.  Musculoskeletal:  no deformity Neurologic:  MAE spontaneously. No gross focal neurologic deficits are appreciated.  Skin:  Skin is warm, dry and intact. No rash noted. Psychiatric: Mood and affect are normal. Speech and behavior are normal.    ED Results / Procedures / Treatments   Labs (all labs ordered are listed, but only abnormal results are displayed) Labs Reviewed  BASIC METABOLIC PANEL - Abnormal; Notable  for the following components:      Result Value   Sodium 132 (*)    Glucose, Bld 200 (*)    BUN 86 (*)    Creatinine, Ser 1.59 (*)    GFR, Estimated 30 (*)    All other components within normal limits  URINALYSIS, ROUTINE W REFLEX MICROSCOPIC - Abnormal; Notable for the following components:   Color, Urine YELLOW (*)    APPearance CLEAR (*)    All other components within normal limits  TROPONIN I (HIGH SENSITIVITY) - Abnormal; Notable for the following components:   Troponin I (High Sensitivity) 26 (*)    All other components within normal limits  TROPONIN I (HIGH SENSITIVITY) - Abnormal; Notable for the following components:   Troponin I (High Sensitivity) 27 (*)    All other components within normal limits  CBC  HEPATIC FUNCTION PANEL  CBG MONITORING, ED     EKG  ED ECG REPORT I, Merlyn Lot, the attending physician, personally viewed and interpreted this ECG.   Date: 06/05/2022  EKG Time: 18:29  Rate: 85  Rhythm: sinus  Axis: normal  Intervals: normal  ST&T Change: no stemi, no depressions    RADIOLOGY Please see ED Course for my review and interpretation.  I personally reviewed all radiographic images ordered to evaluate for the above acute complaints and reviewed radiology reports and findings.  These findings were personally discussed with the patient.  Please see medical record for radiology report.    PROCEDURES:  Critical Care performed: No  Procedures   MEDICATIONS ORDERED IN ED: Medications  0.9 %  sodium chloride infusion (has no administration in time range)  sodium chloride 0.9 % bolus 500 mL (0 mLs Intravenous Stopped 06/05/22 2206)     IMPRESSION / MDM / ASSESSMENT AND PLAN / ED COURSE  I reviewed the triage vital signs and the nursing notes.                              Differential diagnosis includes, but is not limited to, dehydration, electrolyte abnormality, anemia, dysrhythmia, CVA, IPH  Patient presenting to the ER for  evaluation of symptoms as described above.  Based on symptoms, risk factors and considered above differential, this presenting complaint could reflect a potentially life-threatening illness therefore the patient will be placed on continuous pulse oximetry and telemetry for monitoring.  Laboratory evaluation will be sent to evaluate for the above complaints.      Clinical Course as of 06/06/22 0031  Ludwig Clarks Jun 05, 2022  2115 To lower EXTR as significant elevation of BUN quite prerenal mild hyperglycemia.  Slight decrease in her GFR.  No anemia to suggest GI bleed white count normal.  ET head on my review and interpretation without any evidence of mass or subdural.  No acute radiologic findings per radiology. [PR]  Sat Jun 06, 2022  0026 Urinalysis without findings of infection.  She is quite orthostatic next becoming hypotensive with sitting feels lightheaded.  Will give additional IV fluids.  I think this is likely the source of her symptoms is her renal function does show that she is quite prerenal.  Will order additional IV fluid to inform of confusion.  Will consult hospitalist for admission she lives at home alone do not believe that she is safe for discharge home. [PR]    Clinical Course User Index [PR] Merlyn Lot, MD      FINAL CLINICAL IMPRESSION(S) / ED DIAGNOSES   Final diagnoses:  Orthostatic hypotension  Dehydration     Rx / DC Orders   ED Discharge Orders     None        Note:  This document was prepared using Dragon voice recognition software and may include unintentional dictation errors.    Merlyn Lot, MD 06/06/22 [phone]

## 2022-06-06 ENCOUNTER — Encounter: Payer: Self-pay | Admitting: Radiology

## 2022-06-06 DIAGNOSIS — E860 Dehydration: Secondary | ICD-10-CM | POA: Diagnosis not present

## 2022-06-06 DIAGNOSIS — N179 Acute kidney failure, unspecified: Secondary | ICD-10-CM

## 2022-06-06 DIAGNOSIS — N189 Chronic kidney disease, unspecified: Secondary | ICD-10-CM

## 2022-06-06 DIAGNOSIS — I10 Essential (primary) hypertension: Secondary | ICD-10-CM | POA: Insufficient documentation

## 2022-06-06 DIAGNOSIS — R55 Syncope and collapse: Secondary | ICD-10-CM

## 2022-06-06 DIAGNOSIS — F32A Depression, unspecified: Secondary | ICD-10-CM | POA: Insufficient documentation

## 2022-06-06 DIAGNOSIS — R42 Dizziness and giddiness: Secondary | ICD-10-CM | POA: Diagnosis not present

## 2022-06-06 DIAGNOSIS — I951 Orthostatic hypotension: Secondary | ICD-10-CM | POA: Diagnosis not present

## 2022-06-06 DIAGNOSIS — Z853 Personal history of malignant neoplasm of breast: Secondary | ICD-10-CM

## 2022-06-06 LAB — BASIC METABOLIC PANEL
Anion gap: 11 (ref 5–15)
BUN: 71 mg/dL — ABNORMAL HIGH (ref 8–23)
CO2: 21 mmol/L — ABNORMAL LOW (ref 22–32)
Calcium: 9.4 mg/dL (ref 8.9–10.3)
Chloride: 102 mmol/L (ref 98–111)
Creatinine, Ser: 1.37 mg/dL — ABNORMAL HIGH (ref 0.44–1.00)
GFR, Estimated: 36 mL/min — ABNORMAL LOW (ref >60)
Glucose, Bld: 168 mg/dL — ABNORMAL HIGH (ref 70–99)
Potassium: 3.8 mmol/L (ref 3.5–5.1)
Sodium: 134 mmol/L — ABNORMAL LOW (ref 135–145)

## 2022-06-06 LAB — CBC
HCT: 38.6 % (ref 36.0–46.0)
Hemoglobin: 13.1 g/dL (ref 12.0–15.0)
MCH: 30.8 pg (ref 26.0–34.0)
MCHC: 33.9 g/dL (ref 30.0–36.0)
MCV: 90.8 fL (ref 80.0–100.0)
Platelets: 174 10*3/uL (ref 150–400)
RBC: 4.25 MIL/uL (ref 3.87–5.11)
RDW: 13.2 % (ref 11.5–15.5)
WBC: 8.6 10*3/uL (ref 4.0–10.5)
nRBC: 0 % (ref 0.0–0.2)

## 2022-06-06 MED ORDER — HYDRALAZINE HCL 25 MG PO TABS
25.0000 mg | ORAL_TABLET | Freq: Three times a day (TID) | ORAL | Status: DC
  Administered 2022-06-06 – 2022-06-10 (×12): 25 mg via ORAL
  Filled 2022-06-06 (×13): qty 1

## 2022-06-06 MED ORDER — LORATADINE 10 MG PO TABS: 10.0000 mg | ORAL_TABLET | Freq: Every day | ORAL | Status: DC | PRN

## 2022-06-06 MED ORDER — ACETAMINOPHEN 325 MG PO TABS
650.0000 mg | ORAL_TABLET | Freq: Four times a day (QID) | ORAL | Status: DC | PRN
  Administered 2022-06-06 – 2022-06-08 (×3): 650 mg via ORAL
  Filled 2022-06-06 (×3): qty 2

## 2022-06-06 MED ORDER — ENOXAPARIN SODIUM 30 MG/0.3ML IJ SOSY
30.0000 mg | PREFILLED_SYRINGE | INTRAMUSCULAR | Status: DC
  Administered 2022-06-07 – 2022-06-10 (×4): 30 mg via SUBCUTANEOUS
  Filled 2022-06-06 (×4): qty 0.3

## 2022-06-06 MED ORDER — CLOPIDOGREL BISULFATE 75 MG PO TABS
75.0000 mg | ORAL_TABLET | Freq: Every day | ORAL | Status: DC
  Administered 2022-06-06 – 2022-06-10 (×5): 75 mg via ORAL
  Filled 2022-06-06 (×5): qty 1

## 2022-06-06 MED ORDER — ENOXAPARIN SODIUM 40 MG/0.4ML IJ SOSY
40.0000 mg | PREFILLED_SYRINGE | INTRAMUSCULAR | Status: DC
  Administered 2022-06-06: 40 mg via SUBCUTANEOUS
  Filled 2022-06-06: qty 0.4

## 2022-06-06 MED ORDER — METOPROLOL SUCCINATE ER 25 MG PO TB24
37.5000 mg | ORAL_TABLET | Freq: Every day | ORAL | Status: DC
  Administered 2022-06-06 – 2022-06-09 (×4): 37.5 mg via ORAL
  Filled 2022-06-06 (×4): qty 2

## 2022-06-06 MED ORDER — DULOXETINE HCL 30 MG PO CPEP
60.0000 mg | ORAL_CAPSULE | Freq: Every day | ORAL | Status: DC
  Administered 2022-06-06 – 2022-06-10 (×5): 60 mg via ORAL
  Filled 2022-06-06 (×5): qty 2

## 2022-06-06 MED ORDER — ONDANSETRON HCL 4 MG/2ML IJ SOLN
4.0000 mg | Freq: Four times a day (QID) | INTRAMUSCULAR | Status: DC | PRN
  Administered 2022-06-06 – 2022-06-09 (×2): 4 mg via INTRAVENOUS
  Filled 2022-06-06 (×2): qty 2

## 2022-06-06 MED ORDER — TRAZODONE HCL 50 MG PO TABS: 25.0000 mg | ORAL_TABLET | Freq: Every evening | ORAL | Status: DC | PRN

## 2022-06-06 MED ORDER — SODIUM CHLORIDE 0.9% FLUSH
3.0000 mL | Freq: Two times a day (BID) | INTRAVENOUS | Status: DC
  Administered 2022-06-06 – 2022-06-10 (×8): 3 mL via INTRAVENOUS

## 2022-06-06 MED ORDER — SODIUM CHLORIDE 0.9 % IV SOLN: Freq: Once | INTRAVENOUS | Status: DC

## 2022-06-06 MED ORDER — SODIUM CHLORIDE 0.9 % IV SOLN: INTRAVENOUS | Status: DC

## 2022-06-06 MED ORDER — SIMVASTATIN 20 MG PO TABS
20.0000 mg | ORAL_TABLET | Freq: Every evening | ORAL | Status: DC
  Administered 2022-06-06 – 2022-06-09 (×4): 20 mg via ORAL
  Filled 2022-06-06 (×4): qty 1

## 2022-06-06 MED ORDER — VITAMIN D (ERGOCALCIFEROL) 1.25 MG (50000 UNIT) PO CAPS
50000.0000 [IU] | ORAL_CAPSULE | ORAL | Status: DC
  Administered 2022-06-06: 50000 [IU] via ORAL
  Filled 2022-06-06: qty 1

## 2022-06-06 MED ORDER — GABAPENTIN 300 MG PO CAPS
300.0000 mg | ORAL_CAPSULE | Freq: Every day | ORAL | Status: DC
  Administered 2022-06-06 – 2022-06-09 (×4): 300 mg via ORAL
  Filled 2022-06-06 (×4): qty 1

## 2022-06-06 MED ORDER — DOCUSATE SODIUM 100 MG PO CAPS
100.0000 mg | ORAL_CAPSULE | Freq: Every day | ORAL | Status: DC | PRN
  Administered 2022-06-09: 100 mg via ORAL
  Filled 2022-06-06: qty 1

## 2022-06-06 MED ORDER — VITAMIN B-12 1000 MCG PO TABS
1000.0000 ug | ORAL_TABLET | Freq: Every day | ORAL | Status: DC
  Administered 2022-06-06 – 2022-06-10 (×5): 1000 ug via ORAL
  Filled 2022-06-06 (×5): qty 1

## 2022-06-06 MED ORDER — ONDANSETRON HCL 4 MG PO TABS: 4.0000 mg | ORAL_TABLET | Freq: Four times a day (QID) | ORAL | Status: DC | PRN

## 2022-06-06 MED ORDER — ASPIRIN 81 MG PO TBEC
81.0000 mg | DELAYED_RELEASE_TABLET | Freq: Every day | ORAL | Status: DC
  Administered 2022-06-06 – 2022-06-10 (×5): 81 mg via ORAL
  Filled 2022-06-06 (×5): qty 1

## 2022-06-06 MED ORDER — MAGNESIUM HYDROXIDE 400 MG/5ML PO SUSP: 30.0000 mL | Freq: Every day | ORAL | Status: DC | PRN

## 2022-06-06 MED ORDER — ENOXAPARIN SODIUM 40 MG/0.4ML IJ SOSY: 40.0000 mg | PREFILLED_SYRINGE | INTRAMUSCULAR | Status: DC

## 2022-06-06 MED ORDER — CITALOPRAM HYDROBROMIDE 20 MG PO TABS
10.0000 mg | ORAL_TABLET | Freq: Every day | ORAL | Status: DC
  Administered 2022-06-06 – 2022-06-10 (×5): 10 mg via ORAL
  Filled 2022-06-06 (×5): qty 1

## 2022-06-06 MED ORDER — ANASTROZOLE 1 MG PO TABS
1.0000 mg | ORAL_TABLET | Freq: Every day | ORAL | Status: DC
  Administered 2022-06-06 – 2022-06-10 (×5): 1 mg via ORAL
  Filled 2022-06-06 (×5): qty 1

## 2022-06-06 MED ORDER — ALLOPURINOL 100 MG PO TABS
100.0000 mg | ORAL_TABLET | Freq: Every day | ORAL | Status: DC
  Administered 2022-06-06 – 2022-06-10 (×5): 100 mg via ORAL
  Filled 2022-06-06 (×5): qty 1

## 2022-06-06 MED ORDER — ACETAMINOPHEN 650 MG RE SUPP: 650.0000 mg | Freq: Four times a day (QID) | RECTAL | Status: DC | PRN

## 2022-06-06 NOTE — Progress Notes (Signed)
Briefly, it is a 89 year old female with HTN, peripheral neuropathy, DJD who was admitted earlier this morning for presyncope.  No syncope with LOC, no falls.  Workup was notable for marked orthostatic hypotension with BP going from 161/78 down to 87/74 with just sitting.  Patient was treated with gentle fluid resuscitation with 500 cc NS bolus and then at 100 cc an hour.  Head CT was noted to be without acute abnormality.  Patient seen this morning in conjunction with her close friend and neighbor.  Patient states that her neighbors are the people who are closest to her, does not have any close family nearby.  Patient states that she has chronic dizziness and chronic headache for several months or years.  However the sensation of feeling about to fall is new.  Patient denies any recent intercurrent illness and no evidence of infection.  Agree with plan as instituted to continue hydration with NS at 100 cc an hour for now Will order echocardiogram Of note, patient is on hydralazine at home which has been continued, hydralazine as noted to be associated with orthostasis. Patient continues orthostatic despite adequate fluid repletion, would consider changing hydralazine to another BP medication depending on results of echocardiogram. Creatinine was ordered for tomorrow to follow-up on AKI which we anticipate will improve with hydration. Patient is DNR.

## 2022-06-06 NOTE — Assessment & Plan Note (Signed)
-  We will continue to intensivist with holding parameters and avoid nephrotoxins as well as diuretic therapy.

## 2022-06-06 NOTE — Assessment & Plan Note (Signed)
-  We will continue her Celexa.

## 2022-06-06 NOTE — Assessment & Plan Note (Signed)
-  This is likely prerenal due to mild depletion and dehydration. - We will follow BMP with hydration.

## 2022-06-06 NOTE — Assessment & Plan Note (Signed)
-  This is clearly secondary to orthostatic hypotension due to volume depletion and dehydration as manifested by significant acidemia. - The patient will be admitted to a medical telemetry observation bed. - We will continue hydration with IV normal saline. - We will follow her BMP. - Will follow orthostatics.

## 2022-06-06 NOTE — Assessment & Plan Note (Signed)
-  We will continue Arimidex.

## 2022-06-06 NOTE — ED Notes (Signed)
Attempted ambulating to restroom. Encouraged to remain in bed and use purewick. Request to go to toilet but too dizzy and unstable. Returned to bed. Changed into hospital gown with nonslip footwear. External female catheter replaced.

## 2022-06-06 NOTE — Assessment & Plan Note (Signed)
-  We will continue allopurinol 

## 2022-06-06 NOTE — ED Notes (Signed)
Admitting Provider at bedside. 

## 2022-06-06 NOTE — Progress Notes (Signed)
PHARMACIST - PHYSICIAN COMMUNICATION  CONCERNING:  Enoxaparin (Lovenox) for DVT Prophylaxis    RECOMMENDATION: Patient was prescribed enoxaprin '40mg'$  q24 hours for VTE prophylaxis.   Filed Weights   06/06/22 0525  Weight: 62.1 kg (137 lb)    Body mass index is 23.52 kg/m.  Estimated Creatinine Clearance: 22.2 mL/min (A) (by C-G formula based on SCr of 1.37 mg/dL (H)).  Patient is candidate for enoxaparin '30mg'$  every 24 hours based on CrCl <56m/min or Weight <45kg  DESCRIPTION: Pharm[facility].  Patient is now receiving enoxaparin 30 mg every 24 hours   NRenda Rolls PharmD, [facility]1/27/2024 5:30 AM

## 2022-06-06 NOTE — H&P (Signed)
[place]   PATIENT NAME: Tina Curtis    MR#:  [id number]  DATE OF BIRTH:  Nov 17, 1926  DATE OF ADMISSION:  06/05/2022  PRIMARY CARE PHYSICIAN: Derinda Late, MD   Patient is coming from: Home  REQUESTING/REFERRING PHYSICIAN: Merlyn Lot, MD  CHIEF COMPLAINT:   Chief Complaint  Patient presents with   Weakness    HISTORY OF PRESENT ILLNESS:  Tina Curtis is a 89 y.o. Caucasian female with medical history significant for hypertension, dyslipidemia, peripheral neuropathy, and osteoarthritis as well as gout, presented to the emergency room with acute onset of dizziness and feeling very weak with subsequent presyncope.  She denies any loss of consciousness.  She was afraid she is not able to do any of her ADLs.  No fever or chills.  She admits to nausea and had vomiting earlier this week when she was having carotid ultrasound.  No diarrhea or melena or bright red bleeding per rectum.  No bilious vomitus or hematemesis.  She has headache all the time without blurred vision or diplopia.  No paresthesias or focal muscle weakness.  No chest pain or palpitations.  No cough or wheezing or dyspnea.  ED Course: When she came to the ER, vital signs were within normal and later on BP was 161/78.  She was orthostatic in the ED.  Her supine BP was 121/68 and it dropped to 87/74 just sitting.  Labs revealed mild hyponatremia 132 blood glucose of 200 with BUN of 86 with a creatinine 1.59 and LFTs were within normal.  High sensitive troponin was 26 and later 27 and CBC was within normal.  UA came back unremarkable. EKG as reviewed by me : Normal sinus rhythm with a rate of 86 with PACs and mild T wave inversion laterally. Imaging: Noncontrasted head CT scan revealed atrophy with small vessel ischemic changes, old left basal ganglia lacunar infarct with no acute intra abnormality.  C-spine CT showed no traumatic injury.  It showed mild degenerative changes.  The patient was given 500 mill  IV normal saline followed by 100 mill per hour.  She will be admitted to a medical telemetry observation bed for further evaluation and management PAST MEDICAL HISTORY:   Past Medical History:  Diagnosis Date   Arthritis    Gout   Chronic kidney disease    Diarrhea    Hypercholesteremia    Hypertension    Neuropathy    feet and lower legs   Seasonal allergies    Skin cancer    face    PAST SURGICAL HISTORY:   Past Surgical History:  Procedure Laterality Date   ABDOMINAL HYSTERECTOMY  2000   APPENDECTOMY     BREAST BIOPSY Right 09/19/2019   affirm bx of calcs UOQ, x marker, path pending   BREAST BIOPSY Right 09/19/2019   [place] bx of mass,heart marker, path pending   BREAST BIOPSY Right 09/19/2019   [place] bx of LN, coil marker, path pending   CATARACT EXTRACTION Left    CATARACT EXTRACTION W/PHACO Right 10/24/2014   Procedure: CATARACT EXTRACTION PHACO AND INTRAOCULAR LENS PLACEMENT ([place]);  Surgeon: Leandrew Koyanagi, MD;  Location: [facility];  Service: Ophthalmology;  Laterality: Right;   ESOPHAGOGASTRODUODENOSCOPY N/A 03/07/2018   Procedure: ESOPHAGOGASTRODUODENOSCOPY (EGD);  Surgeon: Lin Landsman, MD;  Location: [facility];  Service: Gastroenterology;  Laterality: N/A;   MASTECTOMY MODIFIED RADICAL Right 10/13/2019   Procedure: MASTECTOMY MODIFIED RADICAL;  Surgeon: Robert Bellow, MD;  Location: [facility] ORS;  Service: General;  Laterality: Right;   RENAL ANGIOGRAPHY Right 04/11/2018   Procedure: RENAL ANGIOGRAPHY;  Surgeon: Algernon Huxley, MD;  Location: [facility];  Service: Cardiovascular;  Laterality: Right;   SIMPLE MASTECTOMY WITH AXILLARY SENTINEL NODE BIOPSY Left 10/13/2019   Procedure: SIMPLE MASTECTOMY TRUE CUT BIOPSY, SENTINEL NODE BIOPSY;  Surgeon: Robert Bellow, MD;  Location: [facility] ORS;  Service: General;  Laterality: Left;    SOCIAL HISTORY:   Social History   Tobacco Use   Smoking status: Never   Smokeless tobacco: Never   Substance Use Topics   Alcohol use: No    FAMILY HISTORY:   Family History  Problem Relation Age of Onset   Hypertension Mother    Hypertension Father    Stroke Father    Breast cancer Daughter 18    DRUG ALLERGIES:   Allergies  Allergen Reactions   Penicillins Anaphylaxis   Drug Ingredient [Zinc]     REVIEW OF SYSTEMS:   ROS As per history of present illness. All pertinent systems were reviewed above. Constitutional, HEENT, cardiovascular, respiratory, GI, GU, musculoskeletal, neuro, psychiatric, endocrine, integumentary and hematologic systems were reviewed and are otherwise negative/unremarkable except for positive findings mentioned above in the HPI.   MEDICATIONS AT HOME:   Prior to Admission medications   Medication Sig Start Date End Date Taking? Authorizing Provider  allopurinol (ZYLOPRIM) 100 MG tablet  01/16/20   [provider]  anastrozole (ARIMIDEX) 1 MG tablet TAKE 1 TABLET(1 MG) BY MOUTH DAILY. DO NOT. START IF RASH IS NOT BETTER 06/04/22   Cammie Sickle, MD  aspirin 81 MG EC tablet Take by mouth. 10/24/20   [provider]  citalopram (CELEXA) 10 MG tablet Take 10 mg by mouth daily.  03/14/18   [provider]  clopidogrel (PLAVIX) 75 MG tablet TAKE 1 TABLET BY MOUTH EVERY DAY 05/16/19   Algernon Huxley, MD  cyanocobalamin (VITAMIN B12) 1000 MCG tablet Take by mouth.    [provider]  docusate sodium (COLACE) 100 MG capsule Take 100 mg by mouth daily as needed for mild constipation.    [provider]  DULoxetine (CYMBALTA) 60 MG capsule Take 60 mg by mouth daily.  11/11/18   [provider]  ergocalciferol (VITAMIN D2) 1.25 MG (50000 UT) capsule Take 1 capsule by mouth once a week. 01/09/15   [provider]  furosemide (LASIX) 20 MG tablet Take 20 mg by mouth daily.  05/19/18   [provider]  gabapentin (NEURONTIN) 300 MG capsule Take 300 mg by mouth at bedtime.    [provider]  hydrALAZINE (APRESOLINE) 25 MG tablet Take 1 tablet by mouth 3 (three) times daily. 04/09/22 04/29/23  [provider]  loratadine (CLARITIN) 10 MG tablet Take 10 mg by mouth daily as needed for allergies. AM    [provider]  metoprolol succinate (TOPROL-XL) 25 MG 24 hr tablet Take 37.5 mg by mouth at bedtime.     [provider]  simvastatin (ZOCOR) 20 MG tablet Take 20 mg by mouth every evening. PM    [provider]      VITAL SIGNS:  Blood pressure 126/68, pulse 84, temperature 97.8 F (36.6 C), resp. rate 18, SpO2 96 %.  PHYSICAL EXAMINATION:  Physical Exam  GENERAL:  89 y.o.-year-old Caucasian female  patient lying in the bed with no acute distress.  EYES: Pupils equal, round, reactive to light and accommodation. No scleral icterus. Extraocular muscles intact.  HEENT: Head atraumatic, normocephalic. Oropharynx with dry mucous membrane and tongue and nasopharynx clear.  NECK:  Supple, no jugular venous distention. No thyroid enlargement, no tenderness.  LUNGS: Normal breath sounds bilaterally, no wheezing, rales,rhonchi or crepitation. No use of accessory muscles of respiration.  CARDIOVASCULAR: Regular rate and rhythm, S1, S2 normal. No murmurs, rubs, or gallops.  ABDOMEN: Soft, nondistended, nontender. Bowel sounds present. No organomegaly or mass.  EXTREMITIES: No pedal edema, cyanosis, or clubbing.  NEUROLOGIC: Cranial nerves II through XII are intact. Muscle strength 5/5 in all extremities. Sensation intact. Gait not checked.  PSYCHIATRIC: The patient is alert and oriented x 3.  Normal affect and good eye contact. SKIN: No obvious rash, lesion, or ulcer.   LABORATORY PANEL:   CBC Recent Labs  Lab 06/05/22 1825  WBC 9.7  HGB 13.4  HCT 39.4  PLT 190   ------------------------------------------------------------------------------------------------------------------  Chemistries  Recent Labs  Lab 06/05/22 1825 06/05/22 2038   NA 132*  --   K 4.0  --   CL 98  --   CO2 22  --   GLUCOSE 200*  --   BUN 86*  --   CREATININE 1.59*  --   CALCIUM 9.8  --   AST  --  27  ALT  --  19  ALKPHOS  --  95  BILITOT  --  0.7   ------------------------------------------------------------------------------------------------------------------  Cardiac Enzymes No results for input(s): "TROPONINI" in the last 168 hours. ------------------------------------------------------------------------------------------------------------------  RADIOLOGY:  CT HEAD WO CONTRAST (5MM)  Result Date: 06/05/2022 CLINICAL DATA:  Weakness, lethargy EXAM: CT HEAD WITHOUT CONTRAST CT CERVICAL SPINE WITHOUT CONTRAST TECHNIQUE: Multidetector CT imaging of the head and cervical spine was performed following the standard protocol without intravenous contrast. Multiplanar CT image reconstructions of the cervical spine were also generated. RADIATION DOSE REDUCTION: This exam was performed according to the departmental dose-optimization program which includes automated exposure control, adjustment of the mA and/or kV according to patient size and/or use of iterative reconstruction technique. COMPARISON:  CT head dated 03/18/2022. FINDINGS: CT HEAD FINDINGS Brain: No evidence of acute infarction, hemorrhage, hydrocephalus, extra-axial collection or mass lesion/mass effect. Global cortical atrophy. Subcortical white matter and periventricular small vessel ischemic changes. Old left basal ganglia lacunar infarct. Vascular: Intracranial atherosclerosis. Skull: Normal. Negative for fracture or focal lesion. Sinuses/Orbits: The visualized paranasal sinuses are essentially clear. The mastoid air cells are unopacified. Other: None. CT CERVICAL SPINE FINDINGS Alignment: Normal cervical lordosis. Skull base and vertebrae: No acute fracture. No primary bone lesion or focal pathologic process. Soft tissues and spinal canal: No prevertebral fluid or swelling. No visible canal  hematoma. Disc levels: Mild degenerative changes of the mid cervical spine, most prominent at C3-4. Spinal canal is patent. Upper chest: Scarring/radiation changes at the bilateral lung apices. Other: None. IMPRESSION: No acute intracranial abnormality. Atrophy with small vessel ischemic changes. Old left basal ganglia lacunar infarct. No traumatic injury to the cervical spine. Mild degenerative changes. Electronically Signed   By: Julian Hy M.D.   On: 06/05/2022 20:47   CT Cervical Spine Wo Contrast  Result Date: 06/05/2022 CLINICAL DATA:  Weakness, lethargy EXAM: CT HEAD WITHOUT CONTRAST CT CERVICAL SPINE WITHOUT CONTRAST TECHNIQUE: Multidetector CT imaging of the head and cervical spine was performed following the standard protocol without intravenous contrast. Multiplanar CT image reconstructions of the cervical spine were also generated. RADIATION DOSE REDUCTION: This exam was performed according to the departmental dose-optimization program which includes automated exposure control, adjustment of the mA and/or kV  according to patient size and/or use of iterative reconstruction technique. COMPARISON:  CT head dated 03/18/2022. FINDINGS: CT HEAD FINDINGS Brain: No evidence of acute infarction, hemorrhage, hydrocephalus, extra-axial collection or mass lesion/mass effect. Global cortical atrophy. Subcortical white matter and periventricular small vessel ischemic changes. Old left basal ganglia lacunar infarct. Vascular: Intracranial atherosclerosis. Skull: Normal. Negative for fracture or focal lesion. Sinuses/Orbits: The visualized paranasal sinuses are essentially clear. The mastoid air cells are unopacified. Other: None. CT CERVICAL SPINE FINDINGS Alignment: Normal cervical lordosis. Skull base and vertebrae: No acute fracture. No primary bone lesion or focal pathologic process. Soft tissues and spinal canal: No prevertebral fluid or swelling. No visible canal hematoma. Disc levels: Mild  degenerative changes of the mid cervical spine, most prominent at C3-4. Spinal canal is patent. Upper chest: Scarring/radiation changes at the bilateral lung apices. Other: None. IMPRESSION: No acute intracranial abnormality. Atrophy with small vessel ischemic changes. Old left basal ganglia lacunar infarct. No traumatic injury to the cervical spine. Mild degenerative changes. Electronically Signed   By: Julian Hy M.D.   On: 06/05/2022 20:47   DG Chest Portable 1 View  Result Date: 06/05/2022 CLINICAL DATA:  Weakness EXAM: PORTABLE CHEST 1 VIEW COMPARISON:  03/18/2022 FINDINGS: Mild right basilar opacity, favoring atelectasis over pneumonia. Mild left basilar atelectasis. No pleural effusion or pneumothorax. The heart is normal in size.  Thoracic aortic atherosclerosis. Surgical clips in the right axilla. IMPRESSION: Mild right basilar opacity, favoring atelectasis over pneumonia. Electronically Signed   By: Julian Hy M.D.   On: 06/05/2022 20:23      IMPRESSION AND PLAN:  Assessment and Plan: * Postural dizziness with presyncope - This is clearly secondary to orthostatic hypotension due to volume depletion and dehydration as manifested by significant acidemia. - The patient will be admitted to a medical telemetry observation bed. - We will continue hydration with IV normal saline. - We will follow her BMP. - Will follow orthostatics.  Acute kidney injury superimposed on chronic kidney disease ([place]) - This is likely prerenal due to mild depletion and dehydration. - We will follow BMP with hydration.  Essential hypertension - We will continue to intensivist with holding parameters and avoid nephrotoxins as well as diuretic therapy.  Depression - We will continue her Celexa.  History of breast cancer - We will continue Arimidex.  Gout - We will continue allopurinol.   DVT prophylaxis: Lovenox.  Advanced Care Planning:  Code Status: She is DNR/DNI.  This was  discussed Family Communication:  The plan of care was discussed in details with the patient (and family). I answered all questions. The patient agreed to proceed with the above mentioned plan. Further management will depend upon hospital course. Disposition Plan: Back to previous home environment Consults called: none.  All the records are reviewed and case discussed with ED provider.  Status is: Observation   I certify that at the time of admission, it is my clinical judgment that the patient will require hospital care extending less than 2 midnights.                            Dispo: The patient is from: Home              Anticipated d/c is to: Home              Patient currently is not medically stable to d/c.  Difficult to place patient: No  Christel Mormon M.D on 06/06/2022 at 4:52 AM  [facility]   From 7 PM-7 AM, contact night-coverage [email or web address]  CC: Primary care physician; Derinda Late, MD

## 2022-06-07 ENCOUNTER — Observation Stay: Payer: Medicare PPO

## 2022-06-07 DIAGNOSIS — E876 Hypokalemia: Secondary | ICD-10-CM | POA: Diagnosis not present

## 2022-06-07 DIAGNOSIS — E7800 Pure hypercholesterolemia, unspecified: Secondary | ICD-10-CM | POA: Diagnosis present

## 2022-06-07 DIAGNOSIS — Z8249 Family history of ischemic heart disease and other diseases of the circulatory system: Secondary | ICD-10-CM | POA: Diagnosis not present

## 2022-06-07 DIAGNOSIS — R42 Dizziness and giddiness: Secondary | ICD-10-CM | POA: Diagnosis not present

## 2022-06-07 DIAGNOSIS — F419 Anxiety disorder, unspecified: Secondary | ICD-10-CM | POA: Diagnosis present

## 2022-06-07 DIAGNOSIS — I129 Hypertensive chronic kidney disease with stage 1 through stage 4 chronic kidney disease, or unspecified chronic kidney disease: Secondary | ICD-10-CM | POA: Diagnosis present

## 2022-06-07 DIAGNOSIS — J432 Centrilobular emphysema: Secondary | ICD-10-CM | POA: Diagnosis present

## 2022-06-07 DIAGNOSIS — N184 Chronic kidney disease, stage 4 (severe): Secondary | ICD-10-CM | POA: Diagnosis present

## 2022-06-07 DIAGNOSIS — J189 Pneumonia, unspecified organism: Secondary | ICD-10-CM | POA: Diagnosis present

## 2022-06-07 DIAGNOSIS — M1990 Unspecified osteoarthritis, unspecified site: Secondary | ICD-10-CM | POA: Diagnosis present

## 2022-06-07 DIAGNOSIS — F32A Depression, unspecified: Secondary | ICD-10-CM | POA: Diagnosis present

## 2022-06-07 DIAGNOSIS — I2510 Atherosclerotic heart disease of native coronary artery without angina pectoris: Secondary | ICD-10-CM | POA: Diagnosis present

## 2022-06-07 DIAGNOSIS — I951 Orthostatic hypotension: Secondary | ICD-10-CM | POA: Diagnosis present

## 2022-06-07 DIAGNOSIS — Z85828 Personal history of other malignant neoplasm of skin: Secondary | ICD-10-CM | POA: Diagnosis not present

## 2022-06-07 DIAGNOSIS — G629 Polyneuropathy, unspecified: Secondary | ICD-10-CM | POA: Diagnosis present

## 2022-06-07 DIAGNOSIS — R55 Syncope and collapse: Secondary | ICD-10-CM | POA: Diagnosis present

## 2022-06-07 DIAGNOSIS — E872 Acidosis, unspecified: Secondary | ICD-10-CM | POA: Diagnosis present

## 2022-06-07 DIAGNOSIS — I272 Pulmonary hypertension, unspecified: Secondary | ICD-10-CM | POA: Diagnosis present

## 2022-06-07 DIAGNOSIS — E860 Dehydration: Secondary | ICD-10-CM | POA: Diagnosis present

## 2022-06-07 DIAGNOSIS — Z66 Do not resuscitate: Secondary | ICD-10-CM | POA: Diagnosis present

## 2022-06-07 DIAGNOSIS — J9621 Acute and chronic respiratory failure with hypoxia: Secondary | ICD-10-CM | POA: Diagnosis not present

## 2022-06-07 DIAGNOSIS — Z1152 Encounter for screening for COVID-19: Secondary | ICD-10-CM | POA: Diagnosis not present

## 2022-06-07 DIAGNOSIS — E8770 Fluid overload, unspecified: Secondary | ICD-10-CM | POA: Diagnosis not present

## 2022-06-07 DIAGNOSIS — M109 Gout, unspecified: Secondary | ICD-10-CM | POA: Diagnosis present

## 2022-06-07 DIAGNOSIS — Z853 Personal history of malignant neoplasm of breast: Secondary | ICD-10-CM | POA: Diagnosis not present

## 2022-06-07 DIAGNOSIS — E871 Hypo-osmolality and hyponatremia: Secondary | ICD-10-CM | POA: Diagnosis present

## 2022-06-07 LAB — CBC
HCT: 34.4 % — ABNORMAL LOW (ref 36.0–46.0)
Hemoglobin: 11.5 g/dL — ABNORMAL LOW (ref 12.0–15.0)
MCH: 30.8 pg (ref 26.0–34.0)
MCHC: 33.4 g/dL (ref 30.0–36.0)
MCV: 92.2 fL (ref 80.0–100.0)
Platelets: 145 10*3/uL — ABNORMAL LOW (ref 150–400)
RBC: 3.73 MIL/uL — ABNORMAL LOW (ref 3.87–5.11)
RDW: 13.3 % (ref 11.5–15.5)
WBC: 7.7 10*3/uL (ref 4.0–10.5)
nRBC: 0 % (ref 0.0–0.2)

## 2022-06-07 LAB — COMPREHENSIVE METABOLIC PANEL
ALT: 15 U/L (ref 0–44)
AST: 26 U/L (ref 15–41)
Albumin: 3.2 g/dL — ABNORMAL LOW (ref 3.5–5.0)
Alkaline Phosphatase: 68 U/L (ref 38–126)
Anion gap: 3 — ABNORMAL LOW (ref 5–15)
BUN: 38 mg/dL — ABNORMAL HIGH (ref 8–23)
CO2: 19 mmol/L — ABNORMAL LOW (ref 22–32)
Calcium: 8.1 mg/dL — ABNORMAL LOW (ref 8.9–10.3)
Chloride: 108 mmol/L (ref 98–111)
Creatinine, Ser: 1.33 mg/dL — ABNORMAL HIGH (ref 0.44–1.00)
GFR, Estimated: 37 mL/min — ABNORMAL LOW (ref >60)
Glucose, Bld: 178 mg/dL — ABNORMAL HIGH (ref 70–99)
Potassium: 4.1 mmol/L (ref 3.5–5.1)
Sodium: 130 mmol/L — ABNORMAL LOW (ref 135–145)
Total Bilirubin: 0.7 mg/dL (ref 0.3–1.2)
Total Protein: 6.3 g/dL — ABNORMAL LOW (ref 6.5–8.1)

## 2022-06-07 LAB — PROCALCITONIN: Procalcitonin: <0.1 ng/mL

## 2022-06-07 LAB — GLUCOSE, CAPILLARY: Glucose-Capillary: 155 mg/dL — ABNORMAL HIGH (ref 70–99)

## 2022-06-07 LAB — MRSA NEXT GEN BY PCR, NASAL: MRSA by PCR Next Gen: NOT DETECTED

## 2022-06-07 LAB — LACTIC ACID, PLASMA
Lactic Acid, Venous: 1.4 mmol/L (ref 0.5–1.9)
Lactic Acid, Venous: 1 mmol/L (ref 0.5–1.9)

## 2022-06-07 MED ORDER — SODIUM CHLORIDE 0.9 % IV SOLN
500.0000 mg | INTRAVENOUS | Status: DC
  Administered 2022-06-07 – 2022-06-09 (×3): 500 mg via INTRAVENOUS
  Filled 2022-06-07 (×4): qty 5

## 2022-06-07 MED ORDER — SODIUM CHLORIDE 0.9 % IV SOLN
2.0000 g | INTRAVENOUS | Status: DC
  Administered 2022-06-07 – 2022-06-09 (×3): 2 g via INTRAVENOUS
  Filled 2022-06-07 (×4): qty 20

## 2022-06-07 NOTE — Progress Notes (Addendum)
PROGRESS NOTE    Tina Curtis  QJJ:[id number]  [name]: 10/25/1926  [name]: 06/05/2022 PCP: Tina Late, MD Outpatient Specialists:   Hospital course:  89 year old female with HTN, peripheral neuropathy, DJD who was admitted for presyncope.  No syncope with LOC, no falls.  Workup was notable for marked orthostatic hypotension with BP going from 161/78 down to 87/74 with just sitting.  Patient was treated with gentle fluid resuscitation with 500 cc NS bolus and then at 100 cc an hour.  Head CT was noted to be without acute abnormality.   Subjective:  Patient states that she feels rather unwell.  Notes that she is extremely weak, weaker than she was yesterday.  Also notes that she has been "shaking" all night and has been extremely cold all night.  Patient denies abdominal pain, vomiting, diarrhea, dysuria, shortness of breath, cough, headache.  Her main concern is that she is so weak that she does not want to eat breakfast.   Objective: Vitals:   06/06/22 1559 06/06/22 1923 06/07/22 0419 06/07/22 0733  BP: (!) 159/77 (!) 157/79 (!) 146/71 (!) 156/76  Pulse: 88 84 80 (!) 106  Resp: '18 20 20 18  '$ Temp: 98 F (36.7 C) 98.6 F (37 C) 98.6 F (37 C) 98.3 F (36.8 C)  TempSrc:  Oral Oral Oral  SpO2: 97% 94% 92% 95%  Weight:      Height:        Intake/Output Summary (Last 24 hours) at 06/07/2022 4818 Last data filed at 06/06/2022 2152 Gross per 24 hour  Intake 120 ml  Output --  Net 120 ml   Filed Weights   06/06/22 0525  Weight: 62.1 kg     Exam:  General: Patient looking much more tired and weak than she did yesterday lying listlessly in bed. Eyes: sclera anicteric, conjuctiva mild injection bilaterally CVS: S1-S2, regular  Respiratory:  decreased air entry bilaterally secondary to decreased inspiratory effort, rales at bases, right greater than left. GI: NABS, soft, NT  LE: Warm and well-perfused Neuro: A/O x 3,  grossly nonfocal.  Psych: patient is logical and  coherent, judgement and insight appear normal, mood and affect appropriate to situation.  Data Reviewed:  Basic Metabolic Panel: Recent Labs  Lab 06/05/22 1825 06/06/22 0441  NA 132* 134*  K 4.0 3.8  CL 98 102  CO2 22 21*  GLUCOSE 200* 168*  BUN 86* 71*  CREATININE 1.59* 1.37*  CALCIUM 9.8 9.4    CBC: Recent Labs  Lab 06/05/22 1825 06/06/22 0441  WBC 9.7 8.6  HGB 13.4 13.1  HCT 39.4 38.6  MCV 92.3 90.8  PLT 190 174     Scheduled Meds:  allopurinol  100 mg Oral Daily   anastrozole  1 mg Oral Daily   aspirin EC  81 mg Oral Daily   citalopram  10 mg Oral Daily   clopidogrel  75 mg Oral Daily   cyanocobalamin  1,000 mcg Oral Daily   DULoxetine  60 mg Oral Daily   enoxaparin (LOVENOX) injection  30 mg Subcutaneous Q24H   gabapentin  300 mg Oral QHS   hydrALAZINE  25 mg Oral TID   metoprolol succinate  37.5 mg Oral QHS   simvastatin  20 mg Oral QPM   sodium chloride flush  3 mL Intravenous Q12H   Vitamin D (Ergocalciferol)  50,000 Units Oral Weekly   Continuous Infusions:  sodium chloride Stopped (06/06/22 0359)   cefTRIAXone (ROCEPHIN)  IV  Assessment & Plan:   Weakness/shakes versus rigors--concern for infection/possible CAP I suspect patient has an infection that has not yet been diagnosed Of note, chest x-ray done in ED 2 days ago showed "mild right basilar opacity, favoring atelectasis over pneumonia". I am concerned that patient actually does have a CAP that had yet to bloom on chest x-ray Will start patient empirically on vancomycin, ceftriaxone and azithromycin--patient with history of MRSA 2019--mrsa pcr negative, vancomycin dc'd  Repeat chest x-ray today--no change, consider non con chest ct in am if warranted  Labs ordered including including WBC, lactate, procalcitonin and BMP  Presyncope Patient was orthostatic and this was thought to be cause of her presyncope However patient may well have been symptomatic due to early infection Patient  has been getting IVF for the past 24 hours Hope to see improvement in symptoms with treatment of possible infection  AKI Anticipate improvement with treatment of infection and hydration  HTN Continue present management  Anxiety and depression Patient was placed on both citalopram and duloxetine at admission however med rec has not been finalized.  Will request med rec and consolidate meds as is warranted--med rec is done and patient is on both meds as outpatient   History of breast cancer Continue Arimidex  Antiplatelet agents Patient has been on Plavix for at least 4 years per chart review, will continue    DVT prophylaxis: Lovenox Code Status: DNR Family Communication: Spoke with daughter Tina Curtis who was on the plane and en route to visit her mother here     Studies: CT HEAD [place] (5MM)  Result Date: 06/05/2022 CLINICAL DATA:  Weakness, lethargy EXAM: CT HEAD WITHOUT CONTRAST CT CERVICAL SPINE WITHOUT CONTRAST TECHNIQUE: Multidetector CT imaging of the head and cervical spine was performed following the standard protocol without intravenous contrast. Multiplanar CT image reconstructions of the cervical spine were also generated. RADIATION DOSE REDUCTION: This exam was performed according to the departmental dose-optimization program which includes automated exposure control, adjustment of the mA and/or kV according to patient size and/or use of iterative reconstruction technique. COMPARISON:  CT head dated 03/18/2022. FINDINGS: CT HEAD FINDINGS Brain: No evidence of acute infarction, hemorrhage, hydrocephalus, extra-axial collection or mass lesion/mass effect. Global cortical atrophy. Subcortical white matter and periventricular small vessel ischemic changes. Old left basal ganglia lacunar infarct. Vascular: Intracranial atherosclerosis. Skull: Normal. Negative for fracture or focal lesion. Sinuses/Orbits: The visualized paranasal sinuses are essentially clear. The mastoid  air cells are unopacified. Other: None. CT CERVICAL SPINE FINDINGS Alignment: Normal cervical lordosis. Skull base and vertebrae: No acute fracture. No primary bone lesion or focal pathologic process. Soft tissues and spinal canal: No prevertebral fluid or swelling. No visible canal hematoma. Disc levels: Mild degenerative changes of the mid cervical spine, most prominent at C3-4. Spinal canal is patent. Upper chest: Scarring/radiation changes at the bilateral lung apices. Other: None. IMPRESSION: No acute intracranial abnormality. Atrophy with small vessel ischemic changes. Old left basal ganglia lacunar infarct. No traumatic injury to the cervical spine. Mild degenerative changes. Electronically Signed   By: Julian Hy M.D.   On: 06/05/2022 20:47   CT Cervical Spine Wo Contrast  Result Date: 06/05/2022 CLINICAL DATA:  Weakness, lethargy EXAM: CT HEAD WITHOUT CONTRAST CT CERVICAL SPINE WITHOUT CONTRAST TECHNIQUE: Multidetector CT imaging of the head and cervical spine was performed following the standard protocol without intravenous contrast. Multiplanar CT image reconstructions of the cervical spine were also generated. RADIATION DOSE REDUCTION: This exam was performed according to the  departmental dose-optimization program which includes automated exposure control, adjustment of the mA and/or kV according to patient size and/or use of iterative reconstruction technique. COMPARISON:  CT head dated 03/18/2022. FINDINGS: CT HEAD FINDINGS Brain: No evidence of acute infarction, hemorrhage, hydrocephalus, extra-axial collection or mass lesion/mass effect. Global cortical atrophy. Subcortical white matter and periventricular small vessel ischemic changes. Old left basal ganglia lacunar infarct. Vascular: Intracranial atherosclerosis. Skull: Normal. Negative for fracture or focal lesion. Sinuses/Orbits: The visualized paranasal sinuses are essentially clear. The mastoid air cells are unopacified. Other: None.  CT CERVICAL SPINE FINDINGS Alignment: Normal cervical lordosis. Skull base and vertebrae: No acute fracture. No primary bone lesion or focal pathologic process. Soft tissues and spinal canal: No prevertebral fluid or swelling. No visible canal hematoma. Disc levels: Mild degenerative changes of the mid cervical spine, most prominent at C3-4. Spinal canal is patent. Upper chest: Scarring/radiation changes at the bilateral lung apices. Other: None. IMPRESSION: No acute intracranial abnormality. Atrophy with small vessel ischemic changes. Old left basal ganglia lacunar infarct. No traumatic injury to the cervical spine. Mild degenerative changes. Electronically Signed   By: Julian Hy M.D.   On: 06/05/2022 20:47   DG Chest Portable 1 View  Result Date: 06/05/2022 CLINICAL DATA:  Weakness EXAM: PORTABLE CHEST 1 VIEW COMPARISON:  03/18/2022 FINDINGS: Mild right basilar opacity, favoring atelectasis over pneumonia. Mild left basilar atelectasis. No pleural effusion or pneumothorax. The heart is normal in size.  Thoracic aortic atherosclerosis. Surgical clips in the right axilla. IMPRESSION: Mild right basilar opacity, favoring atelectasis over pneumonia. Electronically Signed   By: Julian Hy M.D.   On: 06/05/2022 20:23    Principal Problem:   Postural dizziness with presyncope Active Problems:   Acute kidney injury superimposed on chronic kidney disease ([place])   Gout   History of breast cancer   Depression   Essential hypertension   Orthostatic hypotension   Dehydration     Kwasi Joung Derek Jack, [facility]  If 7PM-7AM, please contact night-coverage [email or web address]   LOS: 0 days

## 2022-06-08 ENCOUNTER — Inpatient Hospital Stay: Payer: Medicare PPO

## 2022-06-08 ENCOUNTER — Encounter: Payer: Self-pay | Admitting: Internal Medicine

## 2022-06-08 DIAGNOSIS — N184 Chronic kidney disease, stage 4 (severe): Secondary | ICD-10-CM

## 2022-06-08 DIAGNOSIS — J189 Pneumonia, unspecified organism: Secondary | ICD-10-CM

## 2022-06-08 DIAGNOSIS — R42 Dizziness and giddiness: Secondary | ICD-10-CM | POA: Diagnosis not present

## 2022-06-08 DIAGNOSIS — I951 Orthostatic hypotension: Secondary | ICD-10-CM | POA: Diagnosis not present

## 2022-06-08 LAB — CBC WITH DIFFERENTIAL/PLATELET
Abs Immature Granulocytes: 0.04 10*3/uL (ref 0.00–0.07)
Basophils Absolute: 0 10*3/uL (ref 0.0–0.1)
Basophils Relative: 1 %
Eosinophils Absolute: 0.4 10*3/uL (ref 0.0–0.5)
Eosinophils Relative: 4 %
HCT: 32.4 % — ABNORMAL LOW (ref 36.0–46.0)
Hemoglobin: 10.8 g/dL — ABNORMAL LOW (ref 12.0–15.0)
Immature Granulocytes: 1 %
Lymphocytes Relative: 17 %
Lymphs Abs: 1.4 10*3/uL (ref 0.7–4.0)
MCH: 30.9 pg (ref 26.0–34.0)
MCHC: 33.3 g/dL (ref 30.0–36.0)
MCV: 92.6 fL (ref 80.0–100.0)
Monocytes Absolute: 1 10*3/uL (ref 0.1–1.0)
Monocytes Relative: 11 %
Neutro Abs: 5.8 10*3/uL (ref 1.7–7.7)
Neutrophils Relative %: 66 %
Platelets: 137 10*3/uL — ABNORMAL LOW (ref 150–400)
RBC: 3.5 MIL/uL — ABNORMAL LOW (ref 3.87–5.11)
RDW: 13.6 % (ref 11.5–15.5)
WBC: 8.6 10*3/uL (ref 4.0–10.5)
nRBC: 0 % (ref 0.0–0.2)

## 2022-06-08 LAB — BASIC METABOLIC PANEL
Anion gap: 8 (ref 5–15)
BUN: 33 mg/dL — ABNORMAL HIGH (ref 8–23)
CO2: 19 mmol/L — ABNORMAL LOW (ref 22–32)
Calcium: 8 mg/dL — ABNORMAL LOW (ref 8.9–10.3)
Chloride: 108 mmol/L (ref 98–111)
Creatinine, Ser: 1.36 mg/dL — ABNORMAL HIGH (ref 0.44–1.00)
GFR, Estimated: 36 mL/min — ABNORMAL LOW (ref >60)
Glucose, Bld: 143 mg/dL — ABNORMAL HIGH (ref 70–99)
Potassium: 3.9 mmol/L (ref 3.5–5.1)
Sodium: 135 mmol/L (ref 135–145)

## 2022-06-08 MED ORDER — GUAIFENESIN ER 600 MG PO TB12
600.0000 mg | ORAL_TABLET | Freq: Two times a day (BID) | ORAL | Status: DC
  Administered 2022-06-08 – 2022-06-10 (×4): 600 mg via ORAL
  Filled 2022-06-08 (×4): qty 1

## 2022-06-08 MED ORDER — FUROSEMIDE 10 MG/ML IJ SOLN
20.0000 mg | Freq: Once | INTRAMUSCULAR | Status: AC
  Administered 2022-06-08: 20 mg via INTRAVENOUS
  Filled 2022-06-08: qty 4

## 2022-06-08 NOTE — Progress Notes (Signed)
Pt c/o of sob. O2 at 75% on RA. Placed on 2L [place] and now 92%

## 2022-06-08 NOTE — Progress Notes (Signed)
Progress Note    Tina Curtis  DJM:[id number] [name]: 07-29-1926  [name]: 06/05/2022 PCP: Derinda Late, MD      Brief Narrative:    Medical records reviewed and are as summarized below:  Tina Curtis is a 89 y.o. female with medical history significant for peripheral neuropathy, history of orthostatic hypotension (4 over a year), chronic dizziness, hypertension, DJD, was admitted to the hospital for presyncope.       Assessment/Plan:   Principal Problem:   Postural dizziness with presyncope Active Problems:   Gout   CKD (chronic kidney disease) stage 4, GFR 15-29 ml/min (HCC)   History of breast cancer   Depression   Essential hypertension   Orthostatic hypotension   Dehydration   CAP (community acquired pneumonia)     Body mass index is 23.52 kg/m.   Presyncope, chronic orthostatic hypotension and dizziness: Discontinue IV fluids.  Monitor BP closely.  CKD stage IV: Creatinine was about her baseline on admission.  No evidence of AKI.  IV fluids has been discontinued.  Acute hypoxic respiratory failure: Continue 2 L/min oxygen via [place].  Taper off oxygen as able.  CT chest was ordered for further evaluation.  Probable pneumonia, atelectasis since, fluid overload from recent IV fluids: Continue empiric IV antibiotics.  Encourage: Use of incentive spirometer.  Other comorbidities include anxiety, depression, hypertension, history of breast cancer on Arimidex.    Diet Order             Diet Heart Room service appropriate? Yes; Fluid consistency: Thin  Diet effective now                            Consultants: None  Procedures: None    Medications:    allopurinol  100 mg Oral Daily   anastrozole  1 mg Oral Daily   aspirin EC  81 mg Oral Daily   citalopram  10 mg Oral Daily   clopidogrel  75 mg Oral Daily   cyanocobalamin  1,000 mcg Oral Daily   DULoxetine  60 mg Oral Daily   enoxaparin (LOVENOX) injection  30 mg Subcutaneous  Q24H   furosemide  20 mg Intravenous Once   gabapentin  300 mg Oral QHS   hydrALAZINE  25 mg Oral TID   metoprolol succinate  37.5 mg Oral QHS   simvastatin  20 mg Oral QPM   sodium chloride flush  3 mL Intravenous Q12H   Vitamin D (Ergocalciferol)  50,000 Units Oral Weekly   Continuous Infusions:  azithromycin 500 mg (06/08/22 1353)   cefTRIAXone (ROCEPHIN)  IV 2 g (06/08/22 1114)     Anti-infectives (From admission, onward)    Start     Dose/Rate Route Frequency Ordered Stop   06/07/22 1200  azithromycin (ZITHROMAX) 500 mg in sodium chloride 0.9 % 250 mL IVPB        500 mg 250 mL/hr over 60 Minutes Intravenous Every 24 hours 06/07/22 0951 06/12/22 1159   06/07/22 1030  cefTRIAXone (ROCEPHIN) 2 g in sodium chloride 0.9 % 100 mL IVPB        2 g 200 mL/hr over 30 Minutes Intravenous Every 24 hours 06/07/22 0939                Family Communication/Anticipated D/C date and plan/Code Status   DVT prophylaxis: enoxaparin (LOVENOX) injection 30 mg Start: 06/07/22 1000     Code Status: DNR  Family Communication: Plan discussed  with [name], daughter, at the bedside Disposition Plan: Plan to discharge home in 1 to 2 days   Status is: Inpatient Remains inpatient appropriate because: Hypoxia, pneumonia versus CHF       Subjective:   Interval events noted.  No cough, shortness of breath or chest pain.  Oxygen saturation dropped to 75% on room air this morning.  Current, daughter, was at the bedside.  Objective:    Vitals:   06/07/22 2007 06/08/22 0406 06/08/22 0411 06/08/22 1054  BP: (!) 150/70 137/66  118/66  Pulse: 82 89  79  Resp: 18 18    Temp: 97.8 F (36.6 C) 99.1 F (37.3 C)    TempSrc: Oral Oral    SpO2: 94% (!) 75% 92%   Weight:      Height:       Orthostatic VS for the past 24 hrs:  BP- Lying Pulse- Lying BP- Sitting Pulse- Sitting  06/08/22 1050 -- -- 144/73 74  06/08/22 1044 141/74 75 -- --     Intake/Output Summary (Last 24 hours) at  06/08/2022 1724 Last data filed at 06/07/2022 1948 Gross per 24 hour  Intake 100 ml  Output --  Net 100 ml   Filed Weights   06/06/22 0525  Weight: 62.1 kg    Exam:  GEN: NAD SKIN: Warm and dry EYES: No pallor or icterus ENT: MMM CV: RRR PULM: Mild bibasilar rales, no wheezing or rhonchi ABD: soft, ND, NT, +BS CNS: AAO x 3, non focal EXT: No edema or tenderness        Data Reviewed:   I have personally reviewed following labs and imaging studies:  Labs: Labs show the following:   Basic Metabolic Panel: Recent Labs  Lab 06/05/22 1825 06/06/22 0441 06/07/22 1044 06/08/22 0407  NA 132* 134* 130* 135  K 4.0 3.8 4.1 3.9  CL 98 102 108 108  CO2 22 21* 19* 19*  GLUCOSE 200* 168* 178* 143*  BUN 86* 71* 38* 33*  CREATININE 1.59* 1.37* 1.33* 1.36*  CALCIUM 9.8 9.4 8.1* 8.0*   GFR Estimated Creatinine Clearance: 22.3 mL/min (A) (by C-G formula based on SCr of 1.36 mg/dL (H)). Liver Function Tests: Recent Labs  Lab 06/05/22 2038 06/07/22 1044  AST 27 26  ALT 19 15  ALKPHOS 95 68  BILITOT 0.7 0.7  PROT 7.4 6.3*  ALBUMIN 4.0 3.2*   No results for input(s): "LIPASE", "AMYLASE" in the last 168 hours. No results for input(s): "AMMONIA" in the last 168 hours. Coagulation profile No results for input(s): "INR", "PROTIME" in the last 168 hours.  CBC: Recent Labs  Lab 06/05/22 1825 06/06/22 0441 06/07/22 1044 06/08/22 0407  WBC 9.7 8.6 7.7 8.6  NEUTROABS  --   --   --  5.8  HGB 13.4 13.1 11.5* 10.8*  HCT 39.4 38.6 34.4* 32.4*  MCV 92.3 90.8 92.2 92.6  PLT 190 174 145* 137*   Cardiac Enzymes: No results for input(s): "CKTOTAL", "CKMB", "CKMBINDEX", "TROPONINI" in the last 168 hours. BNP (last 3 results) No results for input(s): "PROBNP" in the last 8760 hours. CBG: Recent Labs  Lab 06/07/22 0724  GLUCAP 155*   D-Dimer: No results for input(s): "DDIMER" in the last 72 hours. Hgb A1c: No results for input(s): "HGBA1C" in the last 72  hours. Lipid Profile: No results for input(s): "CHOL", "HDL", "LDLCALC", "TRIG", "CHOLHDL", "LDLDIRECT" in the last 72 hours. Thyroid function studies: No results for input(s): "TSH", "T4TOTAL", "T3FREE", "THYROIDAB" in the last 72 hours.  Invalid input(s): "FREET3" Anemia work up: No results for input(s): "VITAMINB12", "FOLATE", "FERRITIN", "TIBC", "IRON", "RETICCTPCT" in the last 72 hours. Sepsis Labs: Recent Labs  Lab 06/05/22 1825 06/06/22 0441 06/07/22 1044 06/07/22 1325 06/08/22 0407  PROCALCITON  --   --  <0.10  --   --   WBC 9.7 8.6 7.7  --  8.6  LATICACIDVEN  --   --  1.4 1.0  --     Microbiology Recent Results (from the past 240 hour(s))  MRSA Next Gen by PCR, Nasal     Status: None   Collection Time: 06/07/22 10:13 AM   Specimen: Nasal Mucosa; Nasal Swab  Result Value Ref Range Status   MRSA by PCR Next Gen NOT DETECTED NOT DETECTED Final    Comment: (NOTE) The GeneXpert MRSA Assay (FDA approved for NASAL specimens only), is one component of a comprehensive MRSA colonization surveillance program. It is not intended to diagnose MRSA infection nor to guide or monitor treatment for MRSA infections. Test performance is not FDA approved in patients less than 31 years old. Performed at [facility], [address] [place], [place] [address]     Procedures and diagnostic studies:  CT CHEST WO CONTRAST  Result Date: 06/08/2022 CLINICAL DATA:  Abnormal chest radiograph hypoxia, pneumonia versus edema history of breast cancer * Tracking Code: BO * EXAM: CT CHEST WITHOUT CONTRAST TECHNIQUE: Multidetector CT imaging of the chest was performed following the standard protocol without IV contrast. RADIATION DOSE REDUCTION: This exam was performed according to the departmental dose-optimization program which includes automated exposure control, adjustment of the mA and/or kV according to patient size and/or use of iterative reconstruction technique. COMPARISON:   PET-CT, 08/22/2021 FINDINGS: Cardiovascular: Aortic atherosclerosis. Normal heart size. Left coronary artery calcifications. Enlargement of the main pulmonary artery measuring up to 3.5 cm in caliber. No pericardial effusion. Mediastinum/Nodes: Prominent subcentimeter mediastinal lymph nodes. No overtly enlarged mediastinal, hilar, or axillary lymph nodes. Thyroid gland, trachea, and esophagus demonstrate no significant findings. Lungs/Pleura: Trace bilateral pleural effusions and associated atelectasis or consolidation. Subpleural radiation fibrosis of the upper lobes. Scattered ground-glass throughout the upper lobes (series 4, image 47). Mild underlying centrilobular emphysema. Pulmonary hyperinflation. Upper Abdomen: No acute abnormality. Musculoskeletal: Status post bilateral mastectomy. No acute osseous findings. Exaggerated thoracic kyphosis with disc degenerative disease and ankylosis throughout the thoracic spine in keeping with DISH. IMPRESSION: 1. Trace bilateral pleural effusions and associated atelectasis or consolidation. 2. Scattered ground-glass throughout the upper lobes, nonspecific and infectious or inflammatory. 3. Mild underlying centrilobular emphysema and pulmonary hyperinflation. 4. Subpleural radiation fibrosis of the upper lobes. 5. Prominent subcentimeter mediastinal lymph nodes, likely reactive. 6. Enlargement of the main pulmonary artery, as can be seen in pulmonary hypertension. 7. Coronary artery disease. Aortic Atherosclerosis (ICD10-I70.0) and Emphysema (ICD10-J43.9). Electronically Signed   By: Delanna Ahmadi M.D.   On: 06/08/2022 16:29   DG Chest Port 1 View  Result Date: 06/07/2022 CLINICAL DATA:  Weakness. EXAM: PORTABLE CHEST 1 VIEW COMPARISON:  June 05, 2022. FINDINGS: Stable cardiomediastinal silhouette. Minimal bibasilar subsegmental atelectasis. Bony thorax is unremarkable. IMPRESSION: Minimal bibasilar subsegmental atelectasis. Electronically Signed   By: Marijo Conception M.D.   On: 06/07/2022 13:06               LOS: 1 day   Reeder Brisby  [facility]   Pager on [email or web address]. If 7PM-7AM, please contact night-coverage at [email or web address]     06/08/2022, 5:24 PM

## 2022-06-08 NOTE — TOC Initial Note (Addendum)
Transition of Care [facility]) - Initial/Assessment Note    Patient Details  Name: Tina Curtis MRN: [id number] Date of Birth: August 02, 1926  [facility] CM/SW Contact:    Beverly Sessions, RN Phone Number: 06/08/2022, 3:24 PM  Clinical Narrative:                  Met with patient at bedside Admitted for: Weakness/shakes versus rigors--concern for infection/possible CAP  Admitted from: [place]  PCP: Loney Hering Current home health/prior home health/DME: [employer] x2. Standard walker, BSC and bath chair.  PT eval pending 2L acute O2       Patient Goals and CMS Choice            Expected Discharge Plan and Services                                              Prior Living Arrangements/Services                       Activities of Daily Living Home Assistive Devices/Equipment: Walker (specify type) ADL Screening (condition at time of admission) Patient's cognitive ability adequate to safely complete daily activities?: Yes Is the patient deaf or have difficulty hearing?: Yes Does the patient have difficulty seeing, even when wearing glasses/contacts?: No Does the patient have difficulty concentrating, remembering, or making decisions?: No Patient able to express need for assistance with ADLs?: Yes Does the patient have difficulty dressing or bathing?: Yes Independently performs ADLs?: No Communication: Independent Dressing (OT): Needs assistance Is this a change from baseline?: Change from baseline, expected to last >3 days Grooming: Needs assistance Is this a change from baseline?: Change from baseline, expected to last >3 days Feeding: Independent Bathing: Dependent Is this a change from baseline?: Change from baseline, expected to last >3 days Toileting: Dependent Is this a change from baseline?: Change from baseline, expected to last >3days In/Out Bed: Dependent Is this a change from baseline?: Change from baseline, expected  to last >3 days Walks in Home: Dependent Is this a change from baseline?: Change from baseline, expected to last >3 days Does the patient have difficulty walking or climbing stairs?: Yes Weakness of Legs: Both Weakness of Arms/Hands: Both  Permission Sought/Granted                  Emotional Assessment              Admission diagnosis:  Orthostatic hypotension [I95.1] Dehydration [E86.0] Postural dizziness with presyncope [R42, R55] CAP (community acquired pneumonia) [J18.9] Patient Active Problem List   Diagnosis Date Noted   CAP (community acquired pneumonia) 06/07/2022   Postural dizziness with presyncope 06/06/2022   Acute kidney injury superimposed on chronic kidney disease ([place]) 06/06/2022   History of breast cancer 06/06/2022   Depression 06/06/2022   Essential hypertension 06/06/2022   Orthostatic hypotension 06/06/2022   Dehydration 06/06/2022   Dizziness and giddiness 06/02/2022   Osteoporosis of forearm 03/28/2020   Breast cancer ([place]) 10/13/2019   Carcinoma of upper-inner quadrant of right breast in female, estrogen receptor positive ([place]) 09/27/2019   Anemia in chronic kidney disease 02/01/2019   Atherosclerosis of renal artery ([place]) 02/01/2019   Benign hypertensive kidney disease with chronic kidney disease 02/01/2019   Hyposmolality and/or hyponatremia 02/01/2019   Proteinuria 02/01/2019   Secondary hyperparathyroidism of renal origin ([place]) 02/01/2019  Chronic kidney disease, stage IV (severe) ([place]) 04/01/2018   Renovascular hypertension 04/01/2018   Renal artery stenosis (HCC) 04/01/2018   Nausea & vomiting 03/05/2018   AKI (acute kidney injury) ([place]) 02/17/2018   Acquired hammer toe of right foot 08/14/2014   Bunion of right foot 08/14/2014   Gout 08/14/2014   History of Clostridium difficile colitis 08/14/2014   Pure hypercholesterolemia 08/14/2014   RLS (restless legs syndrome) 08/14/2014   Hallux rigidus of right foot 04/24/2014    Primary osteoarthritis of foot 09/01/2013   Fatigue 09/30/2012   Gluteus medius or minimus syndrome 08/23/2012   Trochanteric bursitis 08/12/2012   Benign essential hypertension 09/03/2011   Diverticulosis of colon 09/03/2011   Osteopenia 09/03/2011   Enthesopathy of ankle and tarsus 04/13/2011   Osteoarthritis of spine with radiculopathy, lumbosacral region 09/30/2010   Allergic rhinitis 10/31/1926   PCP:  Derinda Late, MD Pharmacy:   [facility] [place], [place] - [address] [place] [address] Phone: [phone] Fax: [phone]     Social Determinants of Health (SDOH) Social History: SDOH Screenings   Transportation Needs: Unknown (06/06/2022)  Utilities: Unknown (06/06/2022)  Tobacco Use: Low Risk  (06/08/2022)   SDOH Interventions: Housing Interventions: Patient Refused   Readmission Risk Interventions     No data to display

## 2022-06-09 DIAGNOSIS — J189 Pneumonia, unspecified organism: Secondary | ICD-10-CM | POA: Diagnosis not present

## 2022-06-09 DIAGNOSIS — R55 Syncope and collapse: Secondary | ICD-10-CM | POA: Diagnosis not present

## 2022-06-09 DIAGNOSIS — R42 Dizziness and giddiness: Secondary | ICD-10-CM | POA: Diagnosis not present

## 2022-06-09 LAB — BASIC METABOLIC PANEL
Anion gap: 6 (ref 5–15)
BUN: 28 mg/dL — ABNORMAL HIGH (ref 8–23)
CO2: 23 mmol/L (ref 22–32)
Calcium: 8.1 mg/dL — ABNORMAL LOW (ref 8.9–10.3)
Chloride: 107 mmol/L (ref 98–111)
Creatinine, Ser: 1.35 mg/dL — ABNORMAL HIGH (ref 0.44–1.00)
GFR, Estimated: 37 mL/min — ABNORMAL LOW (ref >60)
Glucose, Bld: 102 mg/dL — ABNORMAL HIGH (ref 70–99)
Potassium: 3.4 mmol/L — ABNORMAL LOW (ref 3.5–5.1)
Sodium: 136 mmol/L (ref 135–145)

## 2022-06-09 LAB — RESP PANEL BY RT-PCR (RSV, FLU A&B, COVID)  RVPGX2
Influenza A by PCR: NEGATIVE
Influenza B by PCR: NEGATIVE
Resp Syncytial Virus by PCR: NEGATIVE
SARS Coronavirus 2 by RT PCR: NEGATIVE

## 2022-06-09 MED ORDER — BISACODYL 5 MG PO TBEC
5.0000 mg | DELAYED_RELEASE_TABLET | Freq: Once | ORAL | Status: AC
  Administered 2022-06-09: 5 mg via ORAL
  Filled 2022-06-09: qty 1

## 2022-06-09 MED ORDER — MELATONIN 5 MG PO TABS
5.0000 mg | ORAL_TABLET | Freq: Every day | ORAL | Status: DC
  Administered 2022-06-09: 5 mg via ORAL
  Filled 2022-06-09: qty 1

## 2022-06-09 MED ORDER — POTASSIUM CHLORIDE CRYS ER 20 MEQ PO TBCR
40.0000 meq | EXTENDED_RELEASE_TABLET | Freq: Once | ORAL | Status: AC
  Administered 2022-06-09: 40 meq via ORAL
  Filled 2022-06-09: qty 2

## 2022-06-09 MED ORDER — SODIUM CHLORIDE 0.9 % IV SOLN
12.5000 mg | Freq: Three times a day (TID) | INTRAVENOUS | Status: DC | PRN
  Filled 2022-06-09: qty 0.5

## 2022-06-09 MED ORDER — FUROSEMIDE 10 MG/ML IJ SOLN
20.0000 mg | Freq: Once | INTRAMUSCULAR | Status: AC
  Administered 2022-06-09: 20 mg via INTRAVENOUS
  Filled 2022-06-09: qty 4

## 2022-06-09 NOTE — Progress Notes (Addendum)
Progress Note    Tina Curtis  RCV:[id number] [name]: 10-07-1926  [name]: 06/05/2022 PCP: Derinda Late, MD      Brief Narrative:    Medical records reviewed and are as summarized below:  Tina Curtis is a 89 y.o. female with medical history significant for peripheral neuropathy, history of orthostatic hypotension (4 over a year), chronic dizziness, hypertension, DJD, was admitted to the hospital for presyncope.       Assessment/Plan:   Principal Problem:   Postural dizziness with presyncope Active Problems:   Gout   CKD (chronic kidney disease) stage 4, GFR 15-29 ml/min (HCC)   History of breast cancer   Depression   Essential hypertension   Orthostatic hypotension   Dehydration   CAP (community acquired pneumonia)     Body mass index is 24.33 kg/m.   Presyncope, chronic orthostatic hypotension and dizziness, hypertension: BP is significantly elevated so continue antihypertensives.   CKD stage IV: Creatinine is stable around her baseline.   Acute hypoxic respiratory failure: Continue 2 L/min oxygen via [place].  Taper off oxygen as able.  CT chest reviewed.  Findings include trace bilateral pleural effusions and associated atelectasis or consolidation; scattered groundglass throughout the upper lobes, nonspecific and infectious or inflammatory; mild centrilobular emphysema and pulmonary hyperinflation; subpleural radiation fibrosis of the upper lobes; prominent subcentimeter mediastinal lymph nodes, likely reactive; enlargement of the main pulmonary artery as can be seen in pulmonary hypertension; coronary artery disease.  Patient said she never smoked and she was not a secondhand smoker.  With this chronic lung findings, it would not be surprising if patient has chronic hypoxic respiratory failure.  However, we will try to wean her off oxygen prior to discharge.   Probable pneumonia, atelectasis since, fluid overload from recent IV fluids: Continue empiric IV  antibiotics.  Incentive spirometer as needed.  Give another dose of IV Lasix 20 mg. COVID, influenza and RSV tests were negative.   Hypokalemia: Replete potassium and monitor levels   Other comorbidities include anxiety, depression, hypertension, history of breast cancer on Arimidex.    Diet Order             Diet Heart Room service appropriate? Yes; Fluid consistency: Thin  Diet effective now                            Consultants: None  Procedures: None    Medications:    allopurinol  100 mg Oral Daily   anastrozole  1 mg Oral Daily   aspirin EC  81 mg Oral Daily   citalopram  10 mg Oral Daily   clopidogrel  75 mg Oral Daily   cyanocobalamin  1,000 mcg Oral Daily   DULoxetine  60 mg Oral Daily   enoxaparin (LOVENOX) injection  30 mg Subcutaneous Q24H   gabapentin  300 mg Oral QHS   guaiFENesin  600 mg Oral BID   hydrALAZINE  25 mg Oral TID   melatonin  5 mg Oral QHS   metoprolol succinate  37.5 mg Oral QHS   simvastatin  20 mg Oral QPM   sodium chloride flush  3 mL Intravenous Q12H   Vitamin D (Ergocalciferol)  50,000 Units Oral Weekly   Continuous Infusions:  azithromycin 500 mg (06/09/22 1216)   cefTRIAXone (ROCEPHIN)  IV 2 g (06/09/22 1022)   promethazine (PHENERGAN) injection (IM or IVPB)       Anti-infectives (From admission, onward)  Start     Dose/Rate Route Frequency Ordered Stop   06/07/22 1200  azithromycin (ZITHROMAX) 500 mg in sodium chloride 0.9 % 250 mL IVPB        500 mg 250 mL/hr over 60 Minutes Intravenous Every 24 hours 06/07/22 0951 06/12/22 1159   06/07/22 1030  cefTRIAXone (ROCEPHIN) 2 g in sodium chloride 0.9 % 100 mL IVPB        2 g 200 mL/hr over 30 Minutes Intravenous Every 24 hours 06/07/22 0939                Family Communication/Anticipated D/C date and plan/Code Status   DVT prophylaxis: enoxaparin (LOVENOX) injection 30 mg Start: 06/07/22 1000     Code Status: DNR  Family Communication: Plan  discussed with [name], daughter, at the bedside Disposition Plan: Plan to discharge home in 1 to 2 days   Status is: Inpatient Remains inpatient appropriate because: Hypoxia, pneumonia versus CHF       Subjective:   Interval events noted.  She complains of nausea, "lots of mucus ", lower abdominal pain from constipation.  She feels a little short of breath.  [name], daughter, was at the bedside.  Patient's pastor was also at the bedside.  Patient said it was okay for her pastor to stay during this encounter.  Objective:    Vitals:   06/09/22 0404 06/09/22 0500 06/09/22 0828 06/09/22 1151  BP: (!) 156/75  (!) 191/83 (!) 177/81  Pulse: 71  76 74  Resp: 17  16   Temp: 98.5 F (36.9 C)  99.9 F (37.7 C) 99.3 F (37.4 C)  TempSrc: Oral  Oral Oral  SpO2: 92%  93% 95%  Weight:  64.3 kg    Height:       No data found.    Intake/Output Summary (Last 24 hours) at 06/09/2022 1446 Last data filed at 06/09/2022 0827 Gross per 24 hour  Intake 103.08 ml  Output 1250 ml  Net -1146.92 ml   Filed Weights   06/06/22 0525 06/09/22 0500  Weight: 62.1 kg 64.3 kg    Exam:  GEN: NAD SKIN: Warm and dry EYES: Anicteric ENT: MMM CV: RRR PULM: Mild bibasilar rales, no wheezing ABD: soft, ND, NT, +BS CNS: AAO x 3, non focal EXT: No edema or tenderness      Data Reviewed:   I have personally reviewed following labs and imaging studies:  Labs: Labs show the following:   Basic Metabolic Panel: Recent Labs  Lab 06/05/22 1825 06/06/22 0441 06/07/22 1044 06/08/22 0407 06/09/22 0419  NA 132* 134* 130* 135 136  K 4.0 3.8 4.1 3.9 3.4*  CL 98 102 108 108 107  CO2 22 21* 19* 19* 23  GLUCOSE 200* 168* 178* 143* 102*  BUN 86* 71* 38* 33* 28*  CREATININE 1.59* 1.37* 1.33* 1.36* 1.35*  CALCIUM 9.8 9.4 8.1* 8.0* 8.1*   GFR Estimated Creatinine Clearance: 22.5 mL/min (A) (by C-G formula based on SCr of 1.35 mg/dL (H)). Liver Function Tests: Recent Labs  Lab 06/05/22 2038  06/07/22 1044  AST 27 26  ALT 19 15  ALKPHOS 95 68  BILITOT 0.7 0.7  PROT 7.4 6.3*  ALBUMIN 4.0 3.2*   No results for input(s): "LIPASE", "AMYLASE" in the last 168 hours. No results for input(s): "AMMONIA" in the last 168 hours. Coagulation profile No results for input(s): "INR", "PROTIME" in the last 168 hours.  CBC: Recent Labs  Lab 06/05/22 1825 06/06/22 0441 06/07/22 1044 06/08/22 0407  WBC 9.7 8.6 7.7 8.6  NEUTROABS  --   --   --  5.8  HGB 13.4 13.1 11.5* 10.8*  HCT 39.4 38.6 34.4* 32.4*  MCV 92.3 90.8 92.2 92.6  PLT 190 174 145* 137*   Cardiac Enzymes: No results for input(s): "CKTOTAL", "CKMB", "CKMBINDEX", "TROPONINI" in the last 168 hours. BNP (last 3 results) No results for input(s): "PROBNP" in the last 8760 hours. CBG: Recent Labs  Lab 06/07/22 0724  GLUCAP 155*   D-Dimer: No results for input(s): "DDIMER" in the last 72 hours. Hgb A1c: No results for input(s): "HGBA1C" in the last 72 hours. Lipid Profile: No results for input(s): "CHOL", "HDL", "LDLCALC", "TRIG", "CHOLHDL", "LDLDIRECT" in the last 72 hours. Thyroid function studies: No results for input(s): "TSH", "T4TOTAL", "T3FREE", "THYROIDAB" in the last 72 hours.  Invalid input(s): "FREET3" Anemia work up: No results for input(s): "VITAMINB12", "FOLATE", "FERRITIN", "TIBC", "IRON", "RETICCTPCT" in the last 72 hours. Sepsis Labs: Recent Labs  Lab 06/05/22 1825 06/06/22 0441 06/07/22 1044 06/07/22 1325 06/08/22 0407  PROCALCITON  --   --  <0.10  --   --   WBC 9.7 8.6 7.7  --  8.6  LATICACIDVEN  --   --  1.4 1.0  --     Microbiology Recent Results (from the past 240 hour(s))  MRSA Next Gen by PCR, Nasal     Status: None   Collection Time: 06/07/22 10:13 AM   Specimen: Nasal Mucosa; Nasal Swab  Result Value Ref Range Status   MRSA by PCR Next Gen NOT DETECTED NOT DETECTED Final    Comment: (NOTE) The GeneXpert MRSA Assay (FDA approved for NASAL specimens only), is one component of  a comprehensive MRSA colonization surveillance program. It is not intended to diagnose MRSA infection nor to guide or monitor treatment for MRSA infections. Test performance is not FDA approved in patients less than 30 years old. Performed at [facility], [address] [place], [place] [address]   Resp panel by RT-PCR (RSV, Flu A&B, Covid) Anterior Nasal Swab     Status: None   Collection Time: 06/09/22 10:51 AM   Specimen: Anterior Nasal Swab  Result Value Ref Range Status   SARS Coronavirus 2 by RT PCR NEGATIVE NEGATIVE Final    Comment: (NOTE) SARS-CoV-2 target nucleic acids are NOT DETECTED.  The SARS-CoV-2 RNA is generally detectable in upper respiratory specimens during the acute phase of infection. The lowest concentration of SARS-CoV-2 viral copies this assay can detect is 138 copies/mL. A negative result does not preclude SARS-Cov-2 infection and should not be used as the sole basis for treatment or other patient management decisions. A negative result may occur with  improper specimen collection/handling, submission of specimen other than nasopharyngeal swab, presence of viral mutation(s) within the areas targeted by this assay, and inadequate number of viral copies(<138 copies/mL). A negative result must be combined with clinical observations, patient history, and epidemiological information. The expected result is Negative.  Fact Sheet for Patients:  EntrepreneurPulse.com.au  Fact Sheet for Healthcare Providers:  IncredibleEmployment.be  This test is no t yet approved or cleared by the [place] FDA and  has been authorized for detection and/or diagnosis of SARS-CoV-2 by FDA under an Emergency Use Authorization (EUA). This EUA will remain  in effect (meaning this test can be used) for the duration of the COVID-19 declaration under Section 564(b)(1) of the Act, 21 U.S.C.section 360bbb-3(b)(1), unless the  authorization is terminated  or revoked sooner.  Influenza A by PCR NEGATIVE NEGATIVE Final   Influenza B by PCR NEGATIVE NEGATIVE Final    Comment: (NOTE) The Xpert Xpress SARS-CoV-2/FLU/RSV plus assay is intended as an aid in the diagnosis of influenza from Nasopharyngeal swab specimens and should not be used as a sole basis for treatment. Nasal washings and aspirates are unacceptable for Xpert Xpress SARS-CoV-2/FLU/RSV testing.  Fact Sheet for Patients: EntrepreneurPulse.com.au  Fact Sheet for Healthcare Providers: IncredibleEmployment.be  This test is not yet approved or cleared by the [place] FDA and has been authorized for detection and/or diagnosis of SARS-CoV-2 by FDA under an Emergency Use Authorization (EUA). This EUA will remain in effect (meaning this test can be used) for the duration of the COVID-19 declaration under Section 564(b)(1) of the Act, 21 U.S.C. section 360bbb-3(b)(1), unless the authorization is terminated or revoked.     Resp Syncytial Virus by PCR NEGATIVE NEGATIVE Final    Comment: (NOTE) Fact Sheet for Patients: EntrepreneurPulse.com.au  Fact Sheet for Healthcare Providers: IncredibleEmployment.be  This test is not yet approved or cleared by the [place] FDA and has been authorized for detection and/or diagnosis of SARS-CoV-2 by FDA under an Emergency Use Authorization (EUA). This EUA will remain in effect (meaning this test can be used) for the duration of the COVID-19 declaration under Section 564(b)(1) of the Act, 21 U.S.C. section 360bbb-3(b)(1), unless the authorization is terminated or revoked.  Performed at [facility], [address] [place], [place] [address]     Procedures and diagnostic studies:  CT CHEST WO CONTRAST  Result Date: 06/08/2022 CLINICAL DATA:  Abnormal chest radiograph hypoxia, pneumonia versus edema history of  breast cancer * Tracking Code: BO * EXAM: CT CHEST WITHOUT CONTRAST TECHNIQUE: Multidetector CT imaging of the chest was performed following the standard protocol without IV contrast. RADIATION DOSE REDUCTION: This exam was performed according to the departmental dose-optimization program which includes automated exposure control, adjustment of the mA and/or kV according to patient size and/or use of iterative reconstruction technique. COMPARISON:  PET-CT, 08/22/2021 FINDINGS: Cardiovascular: Aortic atherosclerosis. Normal heart size. Left coronary artery calcifications. Enlargement of the main pulmonary artery measuring up to 3.5 cm in caliber. No pericardial effusion. Mediastinum/Nodes: Prominent subcentimeter mediastinal lymph nodes. No overtly enlarged mediastinal, hilar, or axillary lymph nodes. Thyroid gland, trachea, and esophagus demonstrate no significant findings. Lungs/Pleura: Trace bilateral pleural effusions and associated atelectasis or consolidation. Subpleural radiation fibrosis of the upper lobes. Scattered ground-glass throughout the upper lobes (series 4, image 47). Mild underlying centrilobular emphysema. Pulmonary hyperinflation. Upper Abdomen: No acute abnormality. Musculoskeletal: Status post bilateral mastectomy. No acute osseous findings. Exaggerated thoracic kyphosis with disc degenerative disease and ankylosis throughout the thoracic spine in keeping with DISH. IMPRESSION: 1. Trace bilateral pleural effusions and associated atelectasis or consolidation. 2. Scattered ground-glass throughout the upper lobes, nonspecific and infectious or inflammatory. 3. Mild underlying centrilobular emphysema and pulmonary hyperinflation. 4. Subpleural radiation fibrosis of the upper lobes. 5. Prominent subcentimeter mediastinal lymph nodes, likely reactive. 6. Enlargement of the main pulmonary artery, as can be seen in pulmonary hypertension. 7. Coronary artery disease. Aortic Atherosclerosis (ICD10-I70.0)  and Emphysema (ICD10-J43.9). Electronically Signed   By: Delanna Ahmadi M.D.   On: 06/08/2022 16:29               LOS: 2 days   Shaquon Gropp  [facility]   Pager on [email or web address]. If 7PM-7AM, please contact night-coverage at [email or web address]     06/09/2022, 2:46 PM

## 2022-06-09 NOTE — Progress Notes (Signed)
Patient ID: Tina Curtis, female   [name]: March 26, 1927, 89 y.o.   MRN: [id number] Patient drops into high 70s low 80s when attempting to wean oxygen.  Haydee Salter, RN

## 2022-06-09 NOTE — Progress Notes (Signed)
Patient ID: Tina Curtis, female   [name]: Jan 21, 1927, 89 y.o.   MRN: [id number]  Patient was able to ambulate in hallway on 2L of oxygen for 1 lap. She began having dyspnea and needed to rest. Patient now back in bed resting quietly.  Haydee Salter, RN

## 2022-06-09 NOTE — Progress Notes (Signed)
PT Cancellation Note  Patient Details Name: Tina Curtis MRN: [id number] [name]: Apr 09, 1927   Cancelled Treatment:    Reason Eval/Treat Not Completed: Medical issues which prohibited therapy (Actively vomiting, PT to follow up when patient feeling better and able to participate.)  Minna Merritts, PT, MPT  Percell Locus 06/09/2022, 10:21 AM

## 2022-06-10 DIAGNOSIS — J9621 Acute and chronic respiratory failure with hypoxia: Secondary | ICD-10-CM | POA: Insufficient documentation

## 2022-06-10 DIAGNOSIS — N184 Chronic kidney disease, stage 4 (severe): Secondary | ICD-10-CM | POA: Diagnosis not present

## 2022-06-10 DIAGNOSIS — R42 Dizziness and giddiness: Secondary | ICD-10-CM | POA: Diagnosis not present

## 2022-06-10 DIAGNOSIS — J439 Emphysema, unspecified: Secondary | ICD-10-CM | POA: Diagnosis present

## 2022-06-10 DIAGNOSIS — J189 Pneumonia, unspecified organism: Secondary | ICD-10-CM | POA: Diagnosis not present

## 2022-06-10 LAB — CBC WITH DIFFERENTIAL/PLATELET
Abs Immature Granulocytes: 0.03 10*3/uL (ref 0.00–0.07)
Basophils Absolute: 0 10*3/uL (ref 0.0–0.1)
Basophils Relative: 0 %
Eosinophils Absolute: 0.5 10*3/uL (ref 0.0–0.5)
Eosinophils Relative: 6 %
HCT: 32.3 % — ABNORMAL LOW (ref 36.0–46.0)
Hemoglobin: 10.8 g/dL — ABNORMAL LOW (ref 12.0–15.0)
Immature Granulocytes: 0 %
Lymphocytes Relative: 15 %
Lymphs Abs: 1.3 10*3/uL (ref 0.7–4.0)
MCH: 30.3 pg (ref 26.0–34.0)
MCHC: 33.4 g/dL (ref 30.0–36.0)
MCV: 90.7 fL (ref 80.0–100.0)
Monocytes Absolute: 1 10*3/uL (ref 0.1–1.0)
Monocytes Relative: 12 %
Neutro Abs: 5.7 10*3/uL (ref 1.7–7.7)
Neutrophils Relative %: 67 %
Platelets: 153 10*3/uL (ref 150–400)
RBC: 3.56 MIL/uL — ABNORMAL LOW (ref 3.87–5.11)
RDW: 13.2 % (ref 11.5–15.5)
WBC: 8.6 10*3/uL (ref 4.0–10.5)
nRBC: 0 % (ref 0.0–0.2)

## 2022-06-10 LAB — BASIC METABOLIC PANEL
Anion gap: 8 (ref 5–15)
BUN: 26 mg/dL — ABNORMAL HIGH (ref 8–23)
CO2: 22 mmol/L (ref 22–32)
Calcium: 8.4 mg/dL — ABNORMAL LOW (ref 8.9–10.3)
Chloride: 103 mmol/L (ref 98–111)
Creatinine, Ser: 1.31 mg/dL — ABNORMAL HIGH (ref 0.44–1.00)
GFR, Estimated: 38 mL/min — ABNORMAL LOW (ref >60)
Glucose, Bld: 114 mg/dL — ABNORMAL HIGH (ref 70–99)
Potassium: 3.8 mmol/L (ref 3.5–5.1)
Sodium: 133 mmol/L — ABNORMAL LOW (ref 135–145)

## 2022-06-10 LAB — GLUCOSE, CAPILLARY: Glucose-Capillary: 114 mg/dL — ABNORMAL HIGH (ref 70–99)

## 2022-06-10 MED ORDER — FUROSEMIDE 40 MG PO TABS
40.0000 mg | ORAL_TABLET | Freq: Once | ORAL | Status: AC
  Administered 2022-06-10: 40 mg via ORAL
  Filled 2022-06-10: qty 1

## 2022-06-10 MED ORDER — CEFDINIR 300 MG PO CAPS
300.0000 mg | ORAL_CAPSULE | Freq: Two times a day (BID) | ORAL | Status: DC
  Administered 2022-06-10: 300 mg via ORAL
  Filled 2022-06-10: qty 1

## 2022-06-10 MED ORDER — CEFDINIR 300 MG PO CAPS: 300.0000 mg | ORAL_CAPSULE | Freq: Two times a day (BID) | ORAL | 0 refills | Status: AC

## 2022-06-10 MED ORDER — ALBUTEROL SULFATE HFA 108 (90 BASE) MCG/ACT IN AERS: 2.0000 | INHALATION_SPRAY | Freq: Four times a day (QID) | RESPIRATORY_TRACT | 0 refills | Status: DC | PRN

## 2022-06-10 MED ORDER — FUROSEMIDE 40 MG PO TABS: 40.0000 mg | ORAL_TABLET | Freq: Every day | ORAL | 0 refills | Status: DC

## 2022-06-10 NOTE — Evaluation (Signed)
Physical Therapy Evaluation Patient Details Name: Tina Curtis MRN: [id number] [name]: 1927/02/25 Today's Date: 06/10/2022  History of Present Illness  Tina Curtis is a 89 y.o. Caucasian female with medical history significant for hypertension, dyslipidemia, peripheral neuropathy, and osteoarthritis as well as gout, presented to the emergency room with acute onset of dizziness and feeling very weak with subsequent presyncope.   Clinical Impression  Patient is very pleasant. Motivated to get back home. Daughter present in room. Patient continues to needs supplemental O2 with mobility and at rest. (2 liters at rest, 3 while ambulating). She ambulated 350 feet with RW and supervision. Patient is safe to return home with HHPT and Oxygen. She will continue to benefit from skilled PT to improve strength and activity tolerance.           Recommendations for follow up therapy are one component of a multi-disciplinary discharge planning process, led by the attending physician.  Recommendations may be updated based on patient status, additional functional criteria and insurance authorization.  Follow Up Recommendations Home health PT      Assistance Recommended at Discharge Intermittent Supervision/Assistance  Patient can return home with the following  A little help with walking and/or transfers;A little help with bathing/dressing/bathroom    Equipment Recommendations None recommended by PT  Recommendations for Other Services       Functional Status Assessment Patient has had a recent decline in their functional status and demonstrates the ability to make significant improvements in function in a reasonable and predictable amount of time.     Precautions / Restrictions Precautions Precautions: Fall Restrictions Weight Bearing Restrictions: No      Mobility  Bed Mobility Overal bed mobility: Independent                  Transfers Overall transfer level: Modified  independent Equipment used: Rolling walker (2 wheels)                    Ambulation/Gait Ambulation/Gait assistance: Supervision Gait Distance (Feet): 350 Feet Assistive device: Rolling walker (2 wheels) Gait Pattern/deviations: Step-through pattern, Decreased step length - right, Decreased step length - left, Decreased stride length Gait velocity: slightly decreased     General Gait Details: no lob, O2 sats dropped to 85% on 2 liters with ambulation. Bumped up to 3 liters.  Stairs            Wheelchair Mobility    Modified Rankin (Stroke Patients Only)       Balance Overall balance assessment: Modified Independent                                           Pertinent Vitals/Pain Pain Assessment Pain Assessment: No/denies pain    Home Living Family/patient expects to be discharged to:: Private residence Living Arrangements: Alone   Type of Home: Independent living facility Home Access: Level entry       Home Layout: One level Home Equipment: Rollator (4 wheels);Rolling Walker (2 wheels)      Prior Function Prior Level of Function : Independent/Modified Independent;Driving             Mobility Comments: uses walker at baseline ADLs Comments: independent     Hand Dominance        Extremity/Trunk Assessment   Upper Extremity Assessment Upper Extremity Assessment: Overall WFL for tasks assessed    Lower Extremity  Assessment Lower Extremity Assessment: Generalized weakness    Cervical / Trunk Assessment Cervical / Trunk Assessment: Normal  Communication   Communication: HOH  Cognition Arousal/Alertness: Awake/alert Behavior During Therapy: WFL for tasks assessed/performed Overall Cognitive Status: Within Functional Limits for tasks assessed                                          General Comments      Exercises     Assessment/Plan    PT Assessment Patient needs continued PT services  PT  Problem List Decreased strength;Decreased activity tolerance;Decreased mobility;Cardiopulmonary status limiting activity       PT Treatment Interventions Gait training;Therapeutic exercise;Functional mobility training;Therapeutic activities;Patient/family education    PT Goals (Current goals can be found in the Care Plan section)  Acute Rehab PT Goals Patient Stated Goal: to go home PT Goal Formulation: With patient/family Time For Goal Achievement: 06/18/22 Potential to Achieve Goals: Good    Frequency Min 2X/week     Co-evaluation               AM-PAC PT "6 Clicks" Mobility  Outcome Measure Help needed turning from your back to your side while in a flat bed without using bedrails?: None Help needed moving from lying on your back to sitting on the side of a flat bed without using bedrails?: None Help needed moving to and from a bed to a chair (including a wheelchair)?: A Little Help needed standing up from a chair using your arms (e.g., wheelchair or bedside chair)?: A Little Help needed to walk in hospital room?: A Little Help needed climbing 3-5 steps with a railing? : A Lot 6 Click Score: 19    End of Session Equipment Utilized During Treatment: Gait belt;Oxygen Activity Tolerance: Patient tolerated treatment well Patient left: in bed;with call bell/phone within reach;with family/visitor present Nurse Communication: Mobility status PT Visit Diagnosis: Muscle weakness (generalized) (M62.81)    Time: 7408-1448 PT Time Calculation (min) (ACUTE ONLY): 42 min   Charges:   PT Evaluation $PT Eval Moderate Complexity: 1 Mod PT Treatments $Gait Training: 23-37 mins        Stclair Szymborski, PT, GCS 06/10/22,10:29 AM

## 2022-06-10 NOTE — TOC Transition Note (Signed)
Transition of Care [facility]) - CM/SW Discharge Note   Patient Details  Name: Tina Curtis MRN: [id number] Date of Birth: April 16, 1927  [facility] CM/SW Contact:  Beverly Sessions, RN Phone Number: 06/10/2022, 11:52 AM   Clinical Narrative:     Patient to discharge today Therapy has recommended home health. Daughter at Bedside and to transport at discharge. She states she prefers home health agency at discharge vs [facility] onsite therapy. Patient states she does not have a preference of home health agency.  Referral made to [facility] with [facility].   Patient requires home O2 at discharge.  Referral made to [facility] with [facility].  Portable to be delivered to room prior to discharge.   Seth Bake at [facility] updated          Patient Goals and CMS Choice      Discharge Placement                         Discharge Plan and Services Additional resources added to the After Visit Summary for                                       Social Determinants of Health (SDOH) Interventions SDOH Screenings   Transportation Needs: Unknown (06/06/2022)  Utilities: Unknown (06/06/2022)  Tobacco Use: Low Risk  (06/08/2022)     Readmission Risk Interventions    06/08/2022    3:30 PM  Readmission Risk Prevention Plan  Transportation Screening Complete  Palliative Care Screening Not Applicable  Medication Review (RN Care Manager) Complete

## 2022-06-10 NOTE — Progress Notes (Signed)
Discharge instructions were reviewed with patient and family. Questions were answered and encourage. IV was taken out. Pt denies pain at this time. VSS

## 2022-06-10 NOTE — TOC Progression Note (Signed)
Transition of Care [facility]) - Progression Note    Patient Details  Name: Tina Curtis MRN: [id number] Date of Birth: 09/01/1926  Transition of Care [facility]) CM/SW Contact  Beverly Sessions, RN Phone Number: 06/10/2022, 10:47 AM  Clinical Narrative:    Therapy recommending home health.  Ambulated 350 feet.  Still requiring acute O2.  Seth Bake at [facility] updated         Expected Discharge Plan and [place] Determinants of Health ([place]) Interventions SDOH Screenings   Transportation Needs: Unknown (06/06/2022)  Utilities: Unknown (06/06/2022)  Tobacco Use: Low Risk  (06/08/2022)    Readmission Risk Interventions    06/08/2022    3:30 PM  Readmission Risk Prevention Plan  Transportation Screening Complete  Palliative Care Screening Not Applicable  Medication Review (RN Care Manager) Complete

## 2022-06-10 NOTE — Discharge Summary (Addendum)
Physician Discharge Summary   Patient: Tina Curtis MRN: [id number] [name]: [birth year]  Admit date:     06/05/2022  Discharge date: 06/10/22  Discharge Physician: Jennye Boroughs   PCP: Derinda Late, MD   Recommendations at discharge:   Follow-up with PCP in 1 week Follow-up with pulmonologist as soon as possible for lung function test  Discharge Diagnoses: Principal Problem:   Postural dizziness with presyncope Active Problems:   Gout   CKD (chronic kidney disease) stage 4, GFR 15-29 ml/min (HCC)   History of breast cancer   Depression   Essential hypertension   Orthostatic hypotension   Dehydration   CAP (community acquired pneumonia)   Acute on chronic respiratory failure with hypoxia ([place])   Emphysema lung ([place])  Resolved Problems:   * No resolved hospital problems. *  Hospital Course:  Tina Curtis is a 89 y.o. female with medical history significant for peripheral neuropathy, CKD stage IV, history of orthostatic hypotension (for over a year), chronic dizziness, hypertension, DJD, anxiety, depression, hypertension, history of breast cancer on Arimidex, who presented to the hospital with generalized weakness, lethargy, dizziness and presyncope.  She was recently seen by her PCP and was put on ciprofloxacin and prednisone for possible UTI.  She completed treatment about 2 days prior to admission.    Workup revealed findings concerning for pneumonia.  She was treated with empiric IV antibiotics.  Initially, there was concern for AKI so she was given IV fluids.  IV fluids was discontinued because of fluid overload.  She was given IV Lasix for this.  Patient has CKD stage IV and her creatinine was actually close to baseline.  AKI was ruled out.  She developed acute hypoxic respiratory failure.  She was treated with oxygen via nasal cannula.  CT chest showed chronic findings including radiation fibrosis, emphysema, pulmonary hypertension and coronary artery disease.  Patient  could not be weaned off of oxygen.  She likely has chronic hypoxic respiratory failure from chronic lung disease.  She will be discharged on 2 L/min oxygen via nasal cannula.  She had hypokalemia that was repleted.  Her condition has improved and she is deemed stable for discharge to home today.  Discharge plan was discussed with the patient and Tina Curtis, her daughter, at the bedside.   Assessment and Plan:   Presyncope, chronic orthostatic hypotension and dizziness, hypertension: Continue antihypertensives at discharge.  Patient educated on the importance of sitting up or getting up slowly to avoid sudden drop in blood pressure.    CKD stage IV: Creatinine is stable around her baseline.     Acute on chronic hypoxic respiratory failure: She will be discharged on 2 L/min oxygen via [place] because she could not be weaned off of oxygen.  CT chest findings include trace bilateral pleural effusions and associated atelectasis or consolidation; scattered groundglass throughout the upper lobes, nonspecific and infectious or inflammatory; mild centrilobular emphysema and pulmonary hyperinflation; subpleural radiation fibrosis of the upper lobes; prominent subcentimeter mediastinal lymph nodes, likely reactive; enlargement of the main pulmonary artery as can be seen in pulmonary hypertension; coronary artery disease.  Patient said she never smoked and she was not a secondhand smoker.   Outpatient follow-up with pulmonologist for lung function test   Probable pneumonia, atelectasis, fluid overload from recent IV fluids: She will be discharged on [place].  She was initially treated with IV ceftriaxone and azithromycin.   Hypokalemia: Improved    Other comorbidities include gout, anxiety, depression, hypertension, history of breast cancer  on Arimidex.         Consultants: None Procedures performed: None  Disposition: Home health Diet recommendation:  Discharge Diet Orders (From admission, onward)      Start     Ordered   06/10/22 0000  Diet - low sodium heart healthy        06/10/22 1114           Cardiac diet DISCHARGE MEDICATION: Allergies as of 06/10/2022       Reactions   Penicillins Anaphylaxis   Drug Ingredient [zinc]         Medication List     TAKE these medications    albuterol 108 (90 Base) MCG/ACT inhaler Commonly known as: VENTOLIN HFA Inhale 2 puffs into the lungs every 6 (six) hours as needed for wheezing or shortness of breath.   allopurinol 100 MG tablet Commonly known as: ZYLOPRIM Take 100 mg by mouth daily.   anastrozole 1 MG tablet Commonly known as: ARIMIDEX TAKE 1 TABLET(1 MG) BY MOUTH DAILY. DO NOT. START IF RASH IS NOT BETTER   aspirin EC 81 MG tablet Take 81 mg by mouth daily.   cefdinir 300 MG capsule Commonly known as: OMNICEF Take 1 capsule (300 mg total) by mouth every 12 (twelve) hours for 3 doses.   citalopram 10 MG tablet Commonly known as: CELEXA Take 10 mg by mouth daily.   clopidogrel 75 MG tablet Commonly known as: PLAVIX TAKE 1 TABLET BY MOUTH EVERY DAY   cyanocobalamin 1000 MCG tablet Commonly known as: VITAMIN B12 Take 1,000 mcg by mouth daily.   docusate sodium 100 MG capsule Commonly known as: COLACE Take 100 mg by mouth daily as needed for mild constipation.   DULoxetine 60 MG capsule Commonly known as: CYMBALTA Take 60 mg by mouth daily.   Ergocalciferol 50 MCG (2000 UT) Tabs Take 2,000 Units by mouth daily.   furosemide 40 MG tablet Commonly known as: LASIX Take 1 tablet (40 mg total) by mouth daily. What changed:  medication strength how much to take   gabapentin 300 MG capsule Commonly known as: NEURONTIN Take 300 mg by mouth at bedtime.   hydrALAZINE 25 MG tablet Commonly known as: APRESOLINE Take 1 tablet by mouth 3 (three) times daily.   loratadine 10 MG tablet Commonly known as: CLARITIN Take 10 mg by mouth daily as needed for allergies. AM   metoprolol succinate 25 MG 24 hr  tablet Commonly known as: TOPROL-XL Take 37.5 mg by mouth at bedtime.   simvastatin 20 MG tablet Commonly known as: ZOCOR Take 20 mg by mouth every evening. PM               Durable Medical Equipment  (From admission, onward)           Start     Ordered   06/10/22 0000  For home use only DME oxygen       Question Answer Comment  Length of Need Lifetime   Mode or (Route) Nasal cannula   Liters per Minute 2   Frequency Continuous (stationary and portable oxygen unit needed)   Oxygen conserving device Yes   Oxygen delivery system Gas      06/10/22 1122            Follow-up Information     Ottie Glazier, MD. Schedule an appointment as soon as possible for a visit in 2 day(s).   Specialty: Pulmonary Disease Why: Patient to schedule after referral is placed. Contact information: [phone] [address] [phone]         Derinda Late, MD Follow up.   Specialty: Family Medicine Why: Patient must have referral for Pulmonary due to insurance. Left message with Dr. Baldemar Lenis (spoke with Alyse Low) will request a referral to Pulmonary for patient. Contact information: [address] [facility] [place] [address] [phone]                Discharge Exam: Danley Danker Weights   06/06/22 0525 06/09/22 0500 06/10/22 3716  Weight: 62.1 kg 64.3 kg 60.3 kg   GEN: NAD SKIN: Warm and dry EYES: Anicteric ENT: MMM CV: RRR PULM: Mild bibasilar rales, no wheezing ABD: soft, ND, NT, +BS CNS: AAO x 3, non focal EXT: No edema or tenderness   Condition at discharge: good  The results of significant diagnostics from this hospitalization (including imaging, microbiology, ancillary and laboratory) are listed below for reference.   Imaging Studies: CT CHEST WO CONTRAST  Result Date: 06/08/2022 CLINICAL DATA:  Abnormal chest radiograph hypoxia, pneumonia versus edema history of breast cancer *  Tracking Code: BO * EXAM: CT CHEST WITHOUT CONTRAST TECHNIQUE: Multidetector CT imaging of the chest was performed following the standard protocol without IV contrast. RADIATION DOSE REDUCTION: This exam was performed according to the departmental dose-optimization program which includes automated exposure control, adjustment of the mA and/or kV according to patient size and/or use of iterative reconstruction technique. COMPARISON:  PET-CT, 08/22/2021 FINDINGS: Cardiovascular: Aortic atherosclerosis. Normal heart size. Left coronary artery calcifications. Enlargement of the main pulmonary artery measuring up to 3.5 cm in caliber. No pericardial effusion. Mediastinum/Nodes: Prominent subcentimeter mediastinal lymph nodes. No overtly enlarged mediastinal, hilar, or axillary lymph nodes. Thyroid gland, trachea, and esophagus demonstrate no significant findings. Lungs/Pleura: Trace bilateral pleural effusions and associated atelectasis or consolidation. Subpleural radiation fibrosis of the upper lobes. Scattered ground-glass throughout the upper lobes (series 4, image 47). Mild underlying centrilobular emphysema. Pulmonary hyperinflation. Upper Abdomen: No acute abnormality. Musculoskeletal: Status post bilateral mastectomy. No acute osseous findings. Exaggerated thoracic kyphosis with disc degenerative disease and ankylosis throughout the thoracic spine in keeping with DISH. IMPRESSION: 1. Trace bilateral pleural effusions and associated atelectasis or consolidation. 2. Scattered ground-glass throughout the upper lobes, nonspecific and infectious or inflammatory. 3. Mild underlying centrilobular emphysema and pulmonary hyperinflation. 4. Subpleural radiation fibrosis of the upper lobes. 5. Prominent subcentimeter mediastinal lymph nodes, likely reactive. 6. Enlargement of the main pulmonary artery, as can be seen in pulmonary hypertension. 7. Coronary artery disease. Aortic Atherosclerosis (ICD10-I70.0) and Emphysema  (ICD10-J43.9). Electronically Signed   By: Delanna Ahmadi M.D.   On: 06/08/2022 16:29   VAS US CAROTID  Result Date: 06/08/2022 Carotid Arterial Duplex Study Patient Name:  TOYIA JELINEK  Date of Exam:   06/02/2022 Medical Rec #: [id number]      Accession #:    [id number] Date of Birth: 1926/07/21     Patient Gender: F Patient Age:   9 years Exam Location:  [facility] Procedure:      VAS US CAROTID Referring Phys: Eulogio Ditch --------------------------------------------------------------------------------  Indications: Carotid artery disease. Performing Technologist: Almira Coaster RVS  Examination Guidelines: A complete evaluation includes B-mode imaging, spectral Doppler, color Doppler, and power Doppler as needed of all accessible portions of each vessel. Bilateral testing is considered an integral part of a complete examination. Limited examinations for reoccurring indications may be performed as noted.  Right Carotid Findings: +----------+--------+--------+--------+------------------+--------+  PSV cm/sEDV cm/sStenosisPlaque DescriptionComments +----------+--------+--------+--------+------------------+--------+ CCA Prox  58      11                                         +----------+--------+--------+--------+------------------+--------+ CCA Mid   65      10                                         +----------+--------+--------+--------+------------------+--------+ CCA Distal56      10                                         +----------+--------+--------+--------+------------------+--------+ ICA Prox  79      13                                         +----------+--------+--------+--------+------------------+--------+ ICA Mid   66      16                                         +----------+--------+--------+--------+------------------+--------+ ICA Distal62      14                                          +----------+--------+--------+--------+------------------+--------+ ECA       91      8                                          +----------+--------+--------+--------+------------------+--------+ +----------+--------+-------+--------+-------------------+           PSV cm/sEDV cmsDescribeArm Pressure (mmHG) +----------+--------+-------+--------+-------------------+ [id number]      0                                  +----------+--------+-------+--------+-------------------+ +---------+--------+--+--------+-+ VertebralPSV cm/s32EDV cm/s9 +---------+--------+--+--------+-+  Left Carotid Findings: +----------+--------+--------+--------+------------------+--------+           PSV cm/sEDV cm/sStenosisPlaque DescriptionComments +----------+--------+--------+--------+------------------+--------+ CCA Prox  69      12                                         +----------+--------+--------+--------+------------------+--------+ CCA Mid   63      13                                         +----------+--------+--------+--------+------------------+--------+ CCA Distal60      14                                         +----------+--------+--------+--------+------------------+--------+  ICA Prox  69      21                                         +----------+--------+--------+--------+------------------+--------+ ICA Mid   66      19                                         +----------+--------+--------+--------+------------------+--------+ ICA Distal70      21                                         +----------+--------+--------+--------+------------------+--------+ ECA       50      8                                          +----------+--------+--------+--------+------------------+--------+ +----------+--------+--------+--------+-------------------+           PSV cm/sEDV cm/sDescribeArm Pressure (mmHG)  +----------+--------+--------+--------+-------------------+ [id number]      0                                   +----------+--------+--------+--------+-------------------+ +---------+--------+--+--------+--+ VertebralPSV cm/s45EDV cm/s10 +---------+--------+--+--------+--+   Summary: Right Carotid: There is no evidence of stenosis in the right ICA. Left Carotid: There is no evidence of stenosis in the left ICA. Vertebrals:  Bilateral vertebral arteries demonstrate antegrade flow. Subclavians: Normal flow hemodynamics were seen in bilateral subclavian              arteries. *See table(s) above for measurements and observations.  Electronically signed by Leotis Pain MD on 06/08/2022 at 8:21:07 AM.    Final    DG Chest Port 1 View  Result Date: 06/07/2022 CLINICAL DATA:  Weakness. EXAM: PORTABLE CHEST 1 VIEW COMPARISON:  June 05, 2022. FINDINGS: Stable cardiomediastinal silhouette. Minimal bibasilar subsegmental atelectasis. Bony thorax is unremarkable. IMPRESSION: Minimal bibasilar subsegmental atelectasis. Electronically Signed   By: Marijo Conception M.D.   On: 06/07/2022 13:06   CT HEAD WO CONTRAST (5MM)  Result Date: 06/05/2022 CLINICAL DATA:  Weakness, lethargy EXAM: CT HEAD WITHOUT CONTRAST CT CERVICAL SPINE WITHOUT CONTRAST TECHNIQUE: Multidetector CT imaging of the head and cervical spine was performed following the standard protocol without intravenous contrast. Multiplanar CT image reconstructions of the cervical spine were also generated. RADIATION DOSE REDUCTION: This exam was performed according to the departmental dose-optimization program which includes automated exposure control, adjustment of the mA and/or kV according to patient size and/or use of iterative reconstruction technique. COMPARISON:  CT head dated 03/18/2022. FINDINGS: CT HEAD FINDINGS Brain: No evidence of acute infarction, hemorrhage, hydrocephalus, extra-axial collection or mass lesion/mass effect. Global cortical  atrophy. Subcortical white matter and periventricular small vessel ischemic changes. Old left basal ganglia lacunar infarct. Vascular: Intracranial atherosclerosis. Skull: Normal. Negative for fracture or focal lesion. Sinuses/Orbits: The visualized paranasal sinuses are essentially clear. The mastoid air cells are unopacified. Other: None. CT CERVICAL SPINE FINDINGS Alignment: Normal cervical lordosis. Skull base and vertebrae: No acute fracture. No primary bone lesion or focal pathologic process. Soft tissues and spinal canal:  No prevertebral fluid or swelling. No visible canal hematoma. Disc levels: Mild degenerative changes of the mid cervical spine, most prominent at C3-4. Spinal canal is patent. Upper chest: Scarring/radiation changes at the bilateral lung apices. Other: None. IMPRESSION: No acute intracranial abnormality. Atrophy with small vessel ischemic changes. Old left basal ganglia lacunar infarct. No traumatic injury to the cervical spine. Mild degenerative changes. Electronically Signed   By: Julian Hy M.D.   On: 06/05/2022 20:47   CT Cervical Spine Wo Contrast  Result Date: 06/05/2022 CLINICAL DATA:  Weakness, lethargy EXAM: CT HEAD WITHOUT CONTRAST CT CERVICAL SPINE WITHOUT CONTRAST TECHNIQUE: Multidetector CT imaging of the head and cervical spine was performed following the standard protocol without intravenous contrast. Multiplanar CT image reconstructions of the cervical spine were also generated. RADIATION DOSE REDUCTION: This exam was performed according to the departmental dose-optimization program which includes automated exposure control, adjustment of the mA and/or kV according to patient size and/or use of iterative reconstruction technique. COMPARISON:  CT head dated 03/18/2022. FINDINGS: CT HEAD FINDINGS Brain: No evidence of acute infarction, hemorrhage, hydrocephalus, extra-axial collection or mass lesion/mass effect. Global cortical atrophy. Subcortical white matter and  periventricular small vessel ischemic changes. Old left basal ganglia lacunar infarct. Vascular: Intracranial atherosclerosis. Skull: Normal. Negative for fracture or focal lesion. Sinuses/Orbits: The visualized paranasal sinuses are essentially clear. The mastoid air cells are unopacified. Other: None. CT CERVICAL SPINE FINDINGS Alignment: Normal cervical lordosis. Skull base and vertebrae: No acute fracture. No primary bone lesion or focal pathologic process. Soft tissues and spinal canal: No prevertebral fluid or swelling. No visible canal hematoma. Disc levels: Mild degenerative changes of the mid cervical spine, most prominent at C3-4. Spinal canal is patent. Upper chest: Scarring/radiation changes at the bilateral lung apices. Other: None. IMPRESSION: No acute intracranial abnormality. Atrophy with small vessel ischemic changes. Old left basal ganglia lacunar infarct. No traumatic injury to the cervical spine. Mild degenerative changes. Electronically Signed   By: Julian Hy M.D.   On: 06/05/2022 20:47   DG Chest Portable 1 View  Result Date: 06/05/2022 CLINICAL DATA:  Weakness EXAM: PORTABLE CHEST 1 VIEW COMPARISON:  03/18/2022 FINDINGS: Mild right basilar opacity, favoring atelectasis over pneumonia. Mild left basilar atelectasis. No pleural effusion or pneumothorax. The heart is normal in size.  Thoracic aortic atherosclerosis. Surgical clips in the right axilla. IMPRESSION: Mild right basilar opacity, favoring atelectasis over pneumonia. Electronically Signed   By: Julian Hy M.D.   On: 06/05/2022 20:23    Microbiology: Results for orders placed or performed during the hospital encounter of 06/05/22  MRSA Next Gen by PCR, Nasal     Status: None   Collection Time: 06/07/22 10:13 AM   Specimen: Nasal Mucosa; Nasal Swab  Result Value Ref Range Status   MRSA by PCR Next Gen NOT DETECTED NOT DETECTED Final    Comment: (NOTE) The GeneXpert MRSA Assay (FDA approved for NASAL specimens  only), is one component of a comprehensive MRSA colonization surveillance program. It is not intended to diagnose MRSA infection nor to guide or monitor treatment for MRSA infections. Test performance is not FDA approved in patients less than 44 years old. Performed at [facility], [address] [place], [place] [address]   Resp panel by RT-PCR (RSV, Flu A&B, Covid) Anterior Nasal Swab     Status: None   Collection Time: 06/09/22 10:51 AM   Specimen: Anterior Nasal Swab  Result Value Ref Range Status   SARS Coronavirus 2 by RT  PCR NEGATIVE NEGATIVE Final    Comment: (NOTE) SARS-CoV-2 target nucleic acids are NOT DETECTED.  The SARS-CoV-2 RNA is generally detectable in upper respiratory specimens during the acute phase of infection. The lowest concentration of SARS-CoV-2 viral copies this assay can detect is 138 copies/mL. A negative result does not preclude SARS-Cov-2 infection and should not be used as the sole basis for treatment or other patient management decisions. A negative result may occur with  improper specimen collection/handling, submission of specimen other than nasopharyngeal swab, presence of viral mutation(s) within the areas targeted by this assay, and inadequate number of viral copies(<138 copies/mL). A negative result must be combined with clinical observations, patient history, and epidemiological information. The expected result is Negative.  Fact Sheet for Patients:  EntrepreneurPulse.com.au  Fact Sheet for Healthcare Providers:  IncredibleEmployment.be  This test is no t yet approved or cleared by the [place] FDA and  has been authorized for detection and/or diagnosis of SARS-CoV-2 by FDA under an Emergency Use Authorization (EUA). This EUA will remain  in effect (meaning this test can be used) for the duration of the COVID-19 declaration under Section 564(b)(1) of the Act, 21 U.S.C.section  360bbb-3(b)(1), unless the authorization is terminated  or revoked sooner.       Influenza A by PCR NEGATIVE NEGATIVE Final   Influenza B by PCR NEGATIVE NEGATIVE Final    Comment: (NOTE) The Xpert Xpress SARS-CoV-2/FLU/RSV plus assay is intended as an aid in the diagnosis of influenza from Nasopharyngeal swab specimens and should not be used as a sole basis for treatment. Nasal washings and aspirates are unacceptable for Xpert Xpress SARS-CoV-2/FLU/RSV testing.  Fact Sheet for Patients: EntrepreneurPulse.com.au  Fact Sheet for Healthcare Providers: IncredibleEmployment.be  This test is not yet approved or cleared by the [place] FDA and has been authorized for detection and/or diagnosis of SARS-CoV-2 by FDA under an Emergency Use Authorization (EUA). This EUA will remain in effect (meaning this test can be used) for the duration of the COVID-19 declaration under Section 564(b)(1) of the Act, 21 U.S.C. section 360bbb-3(b)(1), unless the authorization is terminated or revoked.     Resp Syncytial Virus by PCR NEGATIVE NEGATIVE Final    Comment: (NOTE) Fact Sheet for Patients: EntrepreneurPulse.com.au  Fact Sheet for Healthcare Providers: IncredibleEmployment.be  This test is not yet approved or cleared by the [place] FDA and has been authorized for detection and/or diagnosis of SARS-CoV-2 by FDA under an Emergency Use Authorization (EUA). This EUA will remain in effect (meaning this test can be used) for the duration of the COVID-19 declaration under Section 564(b)(1) of the Act, 21 U.S.C. section 360bbb-3(b)(1), unless the authorization is terminated or revoked.  Performed at [facility], [address] [place], [place] [address]     Labs: CBC: Recent Labs  Lab 06/05/22 1825 06/06/22 0441 06/07/22 1044 06/08/22 0407 06/10/22 0442  WBC 9.7 8.6 7.7 8.6 8.6  NEUTROABS   --   --   --  5.8 5.7  HGB 13.4 13.1 11.5* 10.8* 10.8*  HCT 39.4 38.6 34.4* 32.4* 32.3*  MCV 92.3 90.8 92.2 92.6 90.7  PLT 190 174 145* 137* 923   Basic Metabolic Panel: Recent Labs  Lab 06/06/22 0441 06/07/22 1044 06/08/22 0407 06/09/22 0419 06/10/22 0442  NA 134* 130* 135 136 133*  K 3.8 4.1 3.9 3.4* 3.8  CL 102 108 108 107 103  CO2 21* 19* 19* 23 22  GLUCOSE 168* 178* 143* 102* 114*  BUN 71* 38*  33* 28* 26*  CREATININE 1.37* 1.33* 1.36* 1.35* 1.31*  CALCIUM 9.4 8.1* 8.0* 8.1* 8.4*   Liver Function Tests: Recent Labs  Lab 06/05/22 2038 06/07/22 1044  AST 27 26  ALT 19 15  ALKPHOS 95 68  BILITOT 0.7 0.7  PROT 7.4 6.3*  ALBUMIN 4.0 3.2*   CBG: Recent Labs  Lab 06/07/22 0724 06/10/22 0600  GLUCAP 155* 114*    Discharge time spent: greater than 30 minutes.  Signed: Jennye Boroughs, MD [facility] 06/10/2022

## 2022-06-10 NOTE — Care Management Important Message (Signed)
Important Message  Patient Details  Name: Tina Curtis MRN: [id number] Date of Birth: 01-01-1927   Medicare Important Message Given:  Yes     Dannette Barbara 06/10/2022, 12:11 PM

## 2022-06-10 NOTE — Progress Notes (Signed)
SATURATION QUALIFICATIONS: (This note is used to comply with regulatory documentation for home oxygen)  Patient Saturations on Room Air at Rest = 88-89%  Patient Saturations on Room Air while Ambulating = 88%  Patient Saturations on 2 Liters of oxygen while Ambulating = 93%  Please briefly explain why patient needs home oxygen:  Patient is unable to maintained SpO2 above 90% on room air. Patient required 2L while walking.

## 2022-07-22 ENCOUNTER — Ambulatory Visit: Payer: Medicare PPO | Admitting: Podiatry

## 2022-07-22 ENCOUNTER — Encounter: Payer: Self-pay | Admitting: Podiatry

## 2022-07-22 DIAGNOSIS — D2372 Other benign neoplasm of skin of left lower limb, including hip: Secondary | ICD-10-CM | POA: Diagnosis not present

## 2022-07-22 DIAGNOSIS — B351 Tinea unguium: Secondary | ICD-10-CM | POA: Diagnosis not present

## 2022-07-22 DIAGNOSIS — M79675 Pain in left toe(s): Secondary | ICD-10-CM | POA: Diagnosis not present

## 2022-07-22 DIAGNOSIS — D689 Coagulation defect, unspecified: Secondary | ICD-10-CM | POA: Diagnosis not present

## 2022-07-22 DIAGNOSIS — D2371 Other benign neoplasm of skin of right lower limb, including hip: Secondary | ICD-10-CM

## 2022-07-22 DIAGNOSIS — M79674 Pain in right toe(s): Secondary | ICD-10-CM

## 2022-07-22 NOTE — Progress Notes (Signed)
She presents today chief complaint of painful calluses to the right foot and painful elongated toenails.  States that she has been in the hospital recently with lung issues.  Objective: Vital signs stable alert oriented x 3 pulses are strong palpable.  Skin is thin multiple varicosities are noted 2 large lesions plantar aspect of the first and fourth metatarsal phalangeal joints right foot.  Toenails are long thick yellow dystrophic clinical mycotic.  Assessment: Pain in limb secondary to onychomycosis and benign skin lesions.  Plan: Debridement of toenails 1 through 5 bilateral covered service secondary to pain debridement of benign skin lesions.

## 2022-08-05 ENCOUNTER — Other Ambulatory Visit (INDEPENDENT_AMBULATORY_CARE_PROVIDER_SITE_OTHER): Payer: Self-pay | Admitting: Nurse Practitioner

## 2022-08-05 DIAGNOSIS — I701 Atherosclerosis of renal artery: Secondary | ICD-10-CM

## 2022-08-12 ENCOUNTER — Inpatient Hospital Stay: Payer: Medicare PPO | Admitting: Internal Medicine

## 2022-08-12 ENCOUNTER — Inpatient Hospital Stay: Payer: Medicare PPO

## 2022-08-14 ENCOUNTER — Inpatient Hospital Stay: Payer: Medicare PPO

## 2022-08-14 ENCOUNTER — Encounter: Payer: Self-pay | Admitting: Internal Medicine

## 2022-08-14 ENCOUNTER — Inpatient Hospital Stay: Payer: Medicare PPO | Admitting: Internal Medicine

## 2022-08-14 ENCOUNTER — Inpatient Hospital Stay: Payer: Medicare PPO | Attending: Internal Medicine

## 2022-08-14 VITALS — BP 170/80 | HR 72 | Temp 97.3°F | Resp 16 | Wt 137.0 lb

## 2022-08-14 DIAGNOSIS — N184 Chronic kidney disease, stage 4 (severe): Secondary | ICD-10-CM | POA: Insufficient documentation

## 2022-08-14 DIAGNOSIS — E559 Vitamin D deficiency, unspecified: Secondary | ICD-10-CM | POA: Insufficient documentation

## 2022-08-14 DIAGNOSIS — M170 Bilateral primary osteoarthritis of knee: Secondary | ICD-10-CM | POA: Diagnosis not present

## 2022-08-14 DIAGNOSIS — Z170 Estrogen receptor positive status [ER+]: Secondary | ICD-10-CM

## 2022-08-14 DIAGNOSIS — Z79811 Long term (current) use of aromatase inhibitors: Secondary | ICD-10-CM | POA: Diagnosis not present

## 2022-08-14 DIAGNOSIS — R519 Headache, unspecified: Secondary | ICD-10-CM | POA: Insufficient documentation

## 2022-08-14 DIAGNOSIS — C50211 Malignant neoplasm of upper-inner quadrant of right female breast: Secondary | ICD-10-CM

## 2022-08-14 DIAGNOSIS — I129 Hypertensive chronic kidney disease with stage 1 through stage 4 chronic kidney disease, or unspecified chronic kidney disease: Secondary | ICD-10-CM | POA: Diagnosis not present

## 2022-08-14 DIAGNOSIS — C50811 Malignant neoplasm of overlapping sites of right female breast: Secondary | ICD-10-CM | POA: Diagnosis present

## 2022-08-14 DIAGNOSIS — M810 Age-related osteoporosis without current pathological fracture: Secondary | ICD-10-CM

## 2022-08-14 LAB — COMPREHENSIVE METABOLIC PANEL
ALT: 13 U/L (ref 0–44)
AST: 27 U/L (ref 15–41)
Albumin: 3.8 g/dL (ref 3.5–5.0)
Alkaline Phosphatase: 77 U/L (ref 38–126)
Anion gap: 6 (ref 5–15)
BUN: 23 mg/dL (ref 8–23)
CO2: 27 mmol/L (ref 22–32)
Calcium: 9.6 mg/dL (ref 8.9–10.3)
Chloride: 107 mmol/L (ref 98–111)
Creatinine, Ser: 1.18 mg/dL — ABNORMAL HIGH (ref 0.44–1.00)
GFR, Estimated: 43 mL/min — ABNORMAL LOW (ref >60)
Glucose, Bld: 108 mg/dL — ABNORMAL HIGH (ref 70–99)
Potassium: 3.2 mmol/L — ABNORMAL LOW (ref 3.5–5.1)
Sodium: 140 mmol/L (ref 135–145)
Total Bilirubin: 0.4 mg/dL (ref 0.3–1.2)
Total Protein: 7.2 g/dL (ref 6.5–8.1)

## 2022-08-14 LAB — CBC WITH DIFFERENTIAL/PLATELET
Abs Immature Granulocytes: 0.04 10*3/uL (ref 0.00–0.07)
Basophils Absolute: 0 10*3/uL (ref 0.0–0.1)
Basophils Relative: 1 %
Eosinophils Absolute: 0.3 10*3/uL (ref 0.0–0.5)
Eosinophils Relative: 4 %
HCT: 34.7 % — ABNORMAL LOW (ref 36.0–46.0)
Hemoglobin: 11.5 g/dL — ABNORMAL LOW (ref 12.0–15.0)
Immature Granulocytes: 1 %
Lymphocytes Relative: 26 %
Lymphs Abs: 1.9 10*3/uL (ref 0.7–4.0)
MCH: 31.3 pg (ref 26.0–34.0)
MCHC: 33.1 g/dL (ref 30.0–36.0)
MCV: 94.6 fL (ref 80.0–100.0)
Monocytes Absolute: 0.6 10*3/uL (ref 0.1–1.0)
Monocytes Relative: 8 %
Neutro Abs: 4.6 10*3/uL (ref 1.7–7.7)
Neutrophils Relative %: 60 %
Platelets: 188 10*3/uL (ref 150–400)
RBC: 3.67 MIL/uL — ABNORMAL LOW (ref 3.87–5.11)
RDW: 13.9 % (ref 11.5–15.5)
WBC: 7.5 10*3/uL (ref 4.0–10.5)
nRBC: 0 % (ref 0.0–0.2)

## 2022-08-14 LAB — VITAMIN D 25 HYDROXY (VIT D DEFICIENCY, FRACTURES): Vit D, 25-Hydroxy: 37.11 ng/mL (ref 30–100)

## 2022-08-14 MED ORDER — DENOSUMAB 60 MG/ML ~~LOC~~ SOSY
60.0000 mg | PREFILLED_SYRINGE | Freq: Once | SUBCUTANEOUS | Status: AC
  Administered 2022-08-14: 60 mg via SUBCUTANEOUS

## 2022-08-14 NOTE — Assessment & Plan Note (Addendum)
#   Synchronous bilateral breast cancer-status post bilateral mastectomies- Right Breast-stage III ER/PR positive HER-2 NEG; & Left breast cancer stage II ER/PR Pos; Her 2 NEG. PET scan- April 20th, 2023-  Interval bilateral mastectomy and right axillary node dissection. No evidence of chest wall recurrence or thoracic metastatic disease.  Questionable small hypermetabolic lesion centrally in the right hepatic lobe; MRI LIVER  MAY 25th, 2023- NEGATIVE for any cancer; but cysts [see below].    # CONTINUE  anastrozole-as patient's fatigue/headaches have not improved holding anastrozole.   # MAY 2023[facility]na MRI]-  Multi cystic lesions of the pancreas with enlargement measuring 2.6 cm greatest axial dimension; with relative stability since 2021. without high-risk features aside from enlargement since 2019. Recommend in may in 2025. Stable.   # chronic headaches- ? [seen Dr.Shah > 4 years];  MRI- Brain May  2023-  No acute intracranial pathology. S/p evaluation with Dr.Shah- on baclofen. Stable.   # JAN 2024- [hospital]acute hypoxic respiratory failure. 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  Monitor for now.  # Elevated BG- PBF- 108 [Dr.Baboff- HbA1C- 7.4]; not on medications.  Defer to PCP re:  management of DM.   # Mild-moderate arthritic pain-osteoarthritis [bil knee L >R] G-1-2; continue tylenol prn.  Stable.   # vit D def [on ergocalciferol]--Stable.   # Fatigue ? Etiology; s/p Evaluation with Marisue Humble- s/p CARE program- Stable.   # CKD- Stage- IV- GFR 29 [Dr.Kolluru]; -Stable.   # NOV 2021- OsteoporosisT-scoreof -3.7.on ca+vit D. Prolia q 6 months [Feb 2023]. BMD in 3 months/ordered. stable.    Prolia q 33m  # DISPOSITION:  # proceed with prolia.  # follow up in 3 month- MD; labs- cbc/cmp; ca27;29; CEA; ca15-3; 25-OH vit D;BMD prior-  Dr.B  Cc; Dr.Baboff

## 2022-08-14 NOTE — Progress Notes (Signed)
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 Burnett] stage III T2N3 [19/24 LN]; ER/PR positive HER-2 negative.   # Left breast-invasive mammary carcinoma-status post mastectomy [id number]; stage I-ER/PR positive HER-2 negative  #November 23, 2019-start adjuvant radiation bilateral [finished August 31]; no chemo;   #September 22nd 2021- anastrozole ----------------------------------------------------------------------------------------   A. RIGHT BREAST, 3:00; ULTRASOUND-GUIDED BIOPSY:  - INVASIVE MAMMARY CARCINOMA WITH MICROPAPILLARY FEATURES; Size of invasive carcinoma: 18mm in this sample  Histologic grade of invasive carcinoma: Grade 2 ; ER/PR > 905: her 2- NEG  A. RIGHT BREAST, 3:00; ULTRASOUND-GUIDED BIOPSY: - INVASIVE MAMMARY CARCINOMA WITH MICROPAPILLARY FEATURES. 44mm in this sample. Grade 2. Lymphovascular invasion: Present.   B. LYMPH NODE, RIGHT AXILLARY; ULTRASOUND-GUIDED NEEDLE CORE BIOPSY: - METASTATIC MAMMARY CARCINOMA, 6 MM IN THIS SAMPLE.   C. RIGHT BREAST, LATERAL CALCIFICATIONS; STEREOTACTIC BIOPSY: - DUCTAL CARCINOMA IN SITU, INTERMEDIATE GRADE, WITH CALCIFICATIONS. -------------------------------------------------------------   # CKD- Stage III-IV [Dr.Kolluru]  # SURVIVORSHIP:   # GENETICS:   DIAGNOSIS:  Right breast cancer stage III;  left breast cancer-stage I GOALS: Cure  CURRENT/MOST RECENT THERAPY : Anastrozole     Carcinoma of upper-inner quadrant of right breast in female, estrogen receptor positive  09/27/2019 Initial Diagnosis   Carcinoma of upper-inner quadrant of right breast in female, estrogen receptor positive (HCC)      HISTORY OF PRESENTING ILLNESS: Ambulating walking with a rolling walker.  Alone.  Tina Curtis 89 y.o.  female synchronous bilateral breast cancer [right stage III ER/PR positive;Her2 NEG; left stage I ER/PR positive; Her2- NEG] s/p adjuvant radiation; currently on Anastrazole is here for follow-up.   Patient had a sinus infection. Treated with antibiotics. Lingering symptoms.   Otherwise patient denies breast pain, has tenderness with palpation. Denies hot flashes. Appetite is fine. Doesn't enjoy food as much as she used to. Taking her anastrozole   Patient reports headaches, dizziness Status post evaluation with neurology.  On  baclofen. Patient continues to have ongoing fatigue.   Review of Systems  Constitutional:  Positive for malaise/fatigue. Negative for chills, diaphoresis, fever and weight loss.  HENT:  Negative for nosebleeds and sore throat.   Eyes:  Negative for double vision.  Respiratory:  Negative for cough, hemoptysis, sputum production, shortness of breath and wheezing.   Cardiovascular:  Negative for chest pain, palpitations, orthopnea and leg swelling.  Gastrointestinal:  Negative for abdominal pain, blood in stool, constipation, diarrhea, heartburn, melena, nausea and vomiting.  Genitourinary:  Negative for dysuria, frequency and urgency.  Musculoskeletal:  Positive for back pain and joint pain.  Neurological:  Negative for dizziness, tingling, focal weakness, weakness and headaches.  Endo/Heme/Allergies:  Does not bruise/bleed easily.  Psychiatric/Behavioral:  Negative for depression. The patient is not nervous/anxious and does not have insomnia.      MEDICAL HISTORY:  Past Medical History:  Diagnosis Date   Arthritis    Gout   Chronic kidney  disease    Diarrhea    Hypercholesteremia    Hypertension    Neuropathy    feet and lower legs   Seasonal allergies  Skin cancer    face    SURGICAL HISTORY: Past Surgical History:  Procedure Laterality Date   ABDOMINAL HYSTERECTOMY  2000   APPENDECTOMY     BREAST BIOPSY Right 09/19/2019   affirm bx of calcs UOQ, x marker, path pending   BREAST BIOPSY Right 09/19/2019   [place] bx of mass,heart marker, path pending   BREAST BIOPSY Right 09/19/2019   [place] bx of LN, coil marker, path pending   CATARACT EXTRACTION Left    CATARACT EXTRACTION W/PHACO Right 10/24/2014   Procedure: CATARACT EXTRACTION PHACO AND INTRAOCULAR LENS PLACEMENT (IOC);  Surgeon: Lockie Mola, MD;  Location: [facility];  Service: Ophthalmology;  Laterality: Right;   ESOPHAGOGASTRODUODENOSCOPY N/A 03/07/2018   Procedure: ESOPHAGOGASTRODUODENOSCOPY (EGD);  Surgeon: Toney Reil, MD;  Location: [facility];  Service: Gastroenterology;  Laterality: N/A;   MASTECTOMY MODIFIED RADICAL Right 10/13/2019   Procedure: MASTECTOMY MODIFIED RADICAL;  Surgeon: Earline Mayotte, MD;  Location: [facility] ORS;  Service: General;  Laterality: Right;   RENAL ANGIOGRAPHY Right 04/11/2018   Procedure: RENAL ANGIOGRAPHY;  Surgeon: Annice Needy, MD;  Location: [facility] INVASIVE CV LAB;  Service: Cardiovascular;  Laterality: Right;   SIMPLE MASTECTOMY WITH AXILLARY SENTINEL NODE BIOPSY Left 10/13/2019   Procedure: SIMPLE MASTECTOMY TRUE CUT BIOPSY, SENTINEL NODE BIOPSY;  Surgeon: Earline Mayotte, MD;  Location: [facility] ORS;  Service: General;  Laterality: Left;    SOCIAL HISTORY: Social History   Socioeconomic History   Marital status: Widowed    Spouse name: Not on file   Number of children: Not on file   Years of education: Not on file   Highest education level: Not on file  Occupational History   Not on file  Tobacco Use   Smoking status: Never   Smokeless tobacco: Never  Substance and Sexual Activity   Alcohol  use: No   Drug use: Never   Sexual activity: Not on file  Other Topics Concern   Not on file  Social History Narrative   2 daughters; lives in [address]; Runner, broadcasting/film/video retd. 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    Social Determinants of Health   Financial Resource Strain: Not on file  Food Insecurity: Not on file  Transportation Needs: Patient Declined (06/06/2022)   PRAPARE - Administrator, Civil Service (Medical): Patient declined    Lack of Transportation (Non-Medical): Patient declined  Physical Activity: Not on file  Stress: Not on file  Social Connections: Not on file  Intimate Partner Violence: Patient Declined (06/06/2022)   Humiliation, Afraid, Rape, and Kick questionnaire    Fear of Current or Ex-Partner: Patient declined    Emotionally Abused: Patient declined    Physically Abused: Patient declined    Sexually Abused: Patient declined    FAMILY HISTORY: Family History  Problem Relation Age of Onset   Hypertension Mother    Hypertension Father    Stroke Father    Breast cancer Daughter 80    ALLERGIES:  is allergic to penicillins and drug ingredient [zinc].  MEDICATIONS:  Current Outpatient Medications  Medication Sig Dispense Refill   albuterol (VENTOLIN HFA) 108 (90 Base) MCG/ACT inhaler Inhale 2 puffs into the lungs every 6 (six) hours as needed for wheezing or shortness of breath. 8 g 0   allopurinol (ZYLOPRIM) 100 MG tablet Take 100 mg by mouth daily.     anastrozole (ARIMIDEX) 1 MG tablet TAKE 1 TABLET(1 MG) BY MOUTH DAILY. DO NOT. START IF RASH IS NOT BETTER 90  tablet 1   aspirin 81 MG EC tablet Take 81 mg by mouth daily.     citalopram (CELEXA) 10 MG tablet Take 10 mg by mouth daily.      clopidogrel (PLAVIX) 75 MG tablet TAKE 1 TABLET BY MOUTH EVERY DAY 30 tablet 1   cyanocobalamin (VITAMIN B12) 1000 MCG tablet Take 1,000 mcg by mouth daily.     docusate sodium (COLACE) 100 MG capsule Take 100 mg by mouth daily as needed for  mild constipation.     DULoxetine (CYMBALTA) 60 MG capsule Take 60 mg by mouth daily.      Ergocalciferol 50 MCG (2000 UT) TABS Take 2,000 Units by mouth daily.     furosemide (LASIX) 40 MG tablet Take 1 tablet (40 mg total) by mouth daily. 30 tablet 0   gabapentin (NEURONTIN) 300 MG capsule Take 300 mg by mouth at bedtime.     hydrALAZINE (APRESOLINE) 25 MG tablet Take 1 tablet by mouth 3 (three) times daily.     loratadine (CLARITIN) 10 MG tablet Take 10 mg by mouth daily as needed for allergies. AM     metoprolol succinate (TOPROL-XL) 25 MG 24 hr tablet Take 37.5 mg by mouth at bedtime.      simvastatin (ZOCOR) 20 MG tablet Take 20 mg by mouth every evening. PM     No current facility-administered medications for this visit.   Facility-Administered Medications Ordered in Other Visits  Medication Dose Route Frequency Provider Last Rate Last Admin   denosumab (PROLIA) injection 60 mg  60 mg Subcutaneous Once Louretta Shorten R, MD          .  PHYSICAL EXAMINATION: ECOG PERFORMANCE STATUS: 0 - Asymptomatic  Vitals:   08/14/22 1344  BP: (!) 170/80  Pulse: 72  Resp: 16  Temp: (!) 97.3 F (36.3 C)     Filed Weights   08/14/22 1344  Weight: 137 lb (62.1 kg)      Physical Exam Constitutional:      Comments: Walks with a rolling walker.  Alone.  HENT:     Head: Normocephalic and atraumatic.     Mouth/Throat:     Pharynx: No oropharyngeal exudate.  Eyes:     Pupils: Pupils are equal, round, and reactive to light.  Cardiovascular:     Rate and Rhythm: Normal rate and regular rhythm.  Pulmonary:     Effort: Pulmonary effort is normal. No respiratory distress.     Breath sounds: Normal breath sounds. No wheezing.  Abdominal:     General: Bowel sounds are normal. There is no distension.     Palpations: Abdomen is soft. There is no mass.     Tenderness: There is no abdominal tenderness. There is no guarding or rebound.  Musculoskeletal:        General: No tenderness.  Normal range of motion.     Cervical back: Normal range of motion and neck supple.  Skin:    General: Skin is warm.     Comments: Bruising bruising noted on the chest and left arm.  Neurological:     Mental Status: She is alert and oriented to person, place, and time.  Psychiatric:        Mood and Affect: Affect normal.     Results for AGUSTINA, WITZKE (MRN [id number]) as of 01/15/2021 15:37  Ref. Range 08/27/2020 09:55 09/02/2020 12:32 10/22/2020 14:01 12/04/2020 14:35 01/15/2021 14:50  CA 15-3 Latest Ref Range: 0.0 - 25.0 U/mL   32.0 (H) 27.9 (  H)   CA 27.29 Latest Ref Range: 0.0 - 38.6 U/mL   38.0 27.8   CEA Latest Ref Range: 0.0 - 4.7 ng/mL   4.1 3.6    LABORATORY DATA:  I have reviewed the data as listed Lab Results  Component Value Date   WBC 7.5 08/14/2022   HGB 11.5 (L) 08/14/2022   HCT 34.7 (L) 08/14/2022   MCV 94.6 08/14/2022   PLT 188 08/14/2022   Recent Labs    06/05/22 2038 06/06/22 0441 06/07/22 1044 06/08/22 0407 06/09/22 0419 06/10/22 0442 08/14/22 1325  NA  --    < > 130*   < > 136 133* 140  K  --    < > 4.1   < > 3.4* 3.8 3.2*  CL  --    < > 108   < > 107 103 107  CO2  --    < > 19*   < > 23 22 27   GLUCOSE  --    < > 178*   < > 102* 114* 108*  BUN  --    < > 38*   < > 28* 26* 23  CREATININE  --    < > 1.33*   < > 1.35* 1.31* 1.18*  CALCIUM  --    < > 8.1*   < > 8.1* 8.4* 9.6  GFRNONAA  --    < > 37*   < > 37* 38* 43*  PROT 7.4  --  6.3*  --   --   --  7.2  ALBUMIN 4.0  --  3.2*  --   --   --  3.8  AST 27  --  26  --   --   --  27  ALT 19  --  15  --   --   --  13  ALKPHOS 95  --  68  --   --   --  77  BILITOT 0.7  --  0.7  --   --   --  0.4  BILIDIR <0.1  --   --   --   --   --   --   IBILI NOT CALCULATED  --   --   --   --   --   --    < > = values in this interval not displayed.          RADIOGRAPHIC STUDIES: I have personally reviewed the radiological images as listed and agreed with the findings in the report. No results found.  ASSESSMENT  & PLAN:   Carcinoma of upper-inner quadrant of right breast in female, estrogen receptor positive (HCC)  # Synchronous bilateral breast cancer-status post bilateral mastectomies- Right Breast-stage III ER/PR positive HER-2 NEG; & Left breast cancer stage II ER/PR Pos; Her 2 NEG. PET scan- April 20th, 2023-  Interval bilateral mastectomy and right axillary node dissection. No evidence of chest wall recurrence or thoracic metastatic disease.  Questionable small hypermetabolic lesion centrally in the right hepatic lobe; MRI LIVER  MAY 25th, 2023- NEGATIVE for any cancer; but cysts [see below].    # CONTINUE  anastrozole-as patient's fatigue/headaches have not improved holding anastrozole.   # MAY 2023[facility]ia [facility]na- [liver MRI]-  Multi cystic lesions of the pancreas with enlargement measuring 2.6 cm greatest axial dimension; with relative stability since 2021. without high-risk features aside from enlargement since 2019. Recommend in may in 2025. Stable.   # chronic headaches- ? [seen Dr.Shah > 4 years];  MRI- Brain May  2023-  No acute intracranial pathology. S/p evaluation with Dr.Shah- on baclofen. Stable.   # JAN 2024- [hospital]acute hypoxic respiratory failure. 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  Monitor for now.  # Elevated BG- PBF- 108 [Dr.Baboff- HbA1C- 7.4]; not on medications.  Defer to PCP re:  management of DM.   # Mild-moderate arthritic pain-osteoarthritis [bil knee L >R] G-1-2; continue tylenol prn.  Stable.   # vit D def [on ergocalciferol]--Stable.   # Fatigue ? Etiology; s/p Evaluation with Marisue Humble- s/p CARE program- Stable.   # CKD- Stage- IV- GFR 29 [Dr.Kolluru]; -Stable.   # NOV 2021- OsteoporosisT-scoreof -3.7.on ca+vit D. Prolia q 6 months [Feb 2023]. BMD in 3 months/ordered. stable.    Prolia q 54m  # DISPOSITION:  # proceed with prolia.  # follow up in 3 month- MD; labs-  cbc/cmp; ca27;29; CEA; ca15-3; 25-OH vit D;BMD prior-  Dr.B  Cc; [id number]   Earna Coder, MD 08/14/2022 2:41 PM

## 2022-08-14 NOTE — Progress Notes (Signed)
Has had a sinus infection. Treated with antibiotics. Lingering symptoms. Denies breast pain, has tenderness with palpation. Denies hot flashes. Appetite is fine. Doesn't enjoy food as much as she used to. Taking her anastrozole daily.

## 2022-08-15 LAB — CEA: CEA: 4.3 ng/mL (ref 0.0–4.7)

## 2022-08-15 LAB — CANCER ANTIGEN 27.29: CA 27.29: 36.9 U/mL (ref 0.0–38.6)

## 2022-08-15 LAB — CANCER ANTIGEN 15-3: CA 15-3: 34 U/mL — ABNORMAL HIGH (ref 0.0–25.0)

## 2022-08-18 ENCOUNTER — Ambulatory Visit (INDEPENDENT_AMBULATORY_CARE_PROVIDER_SITE_OTHER): Payer: Medicare PPO | Admitting: Vascular Surgery

## 2022-08-18 ENCOUNTER — Encounter (INDEPENDENT_AMBULATORY_CARE_PROVIDER_SITE_OTHER): Payer: Medicare PPO

## 2022-08-20 ENCOUNTER — Ambulatory Visit
Admission: RE | Admit: 2022-08-20 | Discharge: 2022-08-20 | Disposition: A | Discharge status: Home / Self Care | Payer: Medicare PPO | Source: Ambulatory Visit | Attending: Internal Medicine | Admitting: Internal Medicine

## 2022-08-20 DIAGNOSIS — M810 Age-related osteoporosis without current pathological fracture: Secondary | ICD-10-CM | POA: Diagnosis not present

## 2022-08-20 DIAGNOSIS — C50211 Malignant neoplasm of upper-inner quadrant of right female breast: Secondary | ICD-10-CM | POA: Insufficient documentation

## 2022-08-20 DIAGNOSIS — Z170 Estrogen receptor positive status [ER+]: Secondary | ICD-10-CM | POA: Diagnosis present

## 2022-08-22 ENCOUNTER — Emergency Department: Payer: Medicare PPO

## 2022-08-22 ENCOUNTER — Emergency Department
Admission: EM | Admit: 2022-08-22 | Discharge: 2022-08-22 | Disposition: A | Discharge status: Home / Self Care | Payer: Medicare PPO | Attending: Emergency Medicine | Admitting: Emergency Medicine

## 2022-08-22 ENCOUNTER — Other Ambulatory Visit: Payer: Self-pay

## 2022-08-22 DIAGNOSIS — I129 Hypertensive chronic kidney disease with stage 1 through stage 4 chronic kidney disease, or unspecified chronic kidney disease: Secondary | ICD-10-CM | POA: Diagnosis not present

## 2022-08-22 DIAGNOSIS — R1111 Vomiting without nausea: Secondary | ICD-10-CM | POA: Diagnosis present

## 2022-08-22 DIAGNOSIS — N189 Chronic kidney disease, unspecified: Secondary | ICD-10-CM | POA: Insufficient documentation

## 2022-08-22 DIAGNOSIS — I6789 Other cerebrovascular disease: Secondary | ICD-10-CM | POA: Insufficient documentation

## 2022-08-22 DIAGNOSIS — R519 Headache, unspecified: Secondary | ICD-10-CM | POA: Diagnosis not present

## 2022-08-22 DIAGNOSIS — I7143 Infrarenal abdominal aortic aneurysm, without rupture: Secondary | ICD-10-CM | POA: Insufficient documentation

## 2022-08-22 DIAGNOSIS — Z1152 Encounter for screening for COVID-19: Secondary | ICD-10-CM | POA: Insufficient documentation

## 2022-08-22 DIAGNOSIS — I700 Atherosclerosis of aorta: Secondary | ICD-10-CM | POA: Insufficient documentation

## 2022-08-22 DIAGNOSIS — A084 Viral intestinal infection, unspecified: Secondary | ICD-10-CM | POA: Diagnosis not present

## 2022-08-22 DIAGNOSIS — Z7901 Long term (current) use of anticoagulants: Secondary | ICD-10-CM | POA: Diagnosis not present

## 2022-08-22 DIAGNOSIS — E1122 Type 2 diabetes mellitus with diabetic chronic kidney disease: Secondary | ICD-10-CM | POA: Diagnosis not present

## 2022-08-22 DIAGNOSIS — E119 Type 2 diabetes mellitus without complications: Secondary | ICD-10-CM | POA: Insufficient documentation

## 2022-08-22 LAB — URINALYSIS, ROUTINE W REFLEX MICROSCOPIC
Bacteria, UA: NONE SEEN
Bilirubin Urine: NEGATIVE
Glucose, UA: >=500 mg/dL — AB
Hgb urine dipstick: NEGATIVE
Ketones, ur: NEGATIVE mg/dL
Leukocytes,Ua: NEGATIVE
Nitrite: NEGATIVE
Protein, ur: 100 mg/dL — AB
Specific Gravity, Urine: 1.028 (ref 1.005–1.030)
pH: 7 (ref 5.0–8.0)

## 2022-08-22 LAB — COMPREHENSIVE METABOLIC PANEL
ALT: 22 U/L (ref 0–44)
AST: 50 U/L — ABNORMAL HIGH (ref 15–41)
Albumin: 4 g/dL (ref 3.5–5.0)
Alkaline Phosphatase: 93 U/L (ref 38–126)
Anion gap: 12 (ref 5–15)
BUN: 20 mg/dL (ref 8–23)
CO2: 25 mmol/L (ref 22–32)
Calcium: 8.4 mg/dL — ABNORMAL LOW (ref 8.9–10.3)
Chloride: 105 mmol/L (ref 98–111)
Creatinine, Ser: 1.22 mg/dL — ABNORMAL HIGH (ref 0.44–1.00)
GFR, Estimated: 41 mL/min — ABNORMAL LOW (ref >60)
Glucose, Bld: 164 mg/dL — ABNORMAL HIGH (ref 70–99)
Potassium: 3.8 mmol/L (ref 3.5–5.1)
Sodium: 142 mmol/L (ref 135–145)
Total Bilirubin: 0.6 mg/dL (ref 0.3–1.2)
Total Protein: 7.7 g/dL (ref 6.5–8.1)

## 2022-08-22 LAB — CBC
HCT: 38.4 % (ref 36.0–46.0)
Hemoglobin: 12.4 g/dL (ref 12.0–15.0)
MCH: 30.5 pg (ref 26.0–34.0)
MCHC: 32.3 g/dL (ref 30.0–36.0)
MCV: 94.3 fL (ref 80.0–100.0)
Platelets: 206 10*3/uL (ref 150–400)
RBC: 4.07 MIL/uL (ref 3.87–5.11)
RDW: 14 % (ref 11.5–15.5)
WBC: 9 10*3/uL (ref 4.0–10.5)
nRBC: 0 % (ref 0.0–0.2)

## 2022-08-22 LAB — TROPONIN I (HIGH SENSITIVITY)
Troponin I (High Sensitivity): 19 ng/L — ABNORMAL HIGH (ref <18)
Troponin I (High Sensitivity): 18 ng/L — ABNORMAL HIGH (ref <18)

## 2022-08-22 LAB — RESP PANEL BY RT-PCR (RSV, FLU A&B, COVID)  RVPGX2
Influenza A by PCR: NEGATIVE
Influenza B by PCR: NEGATIVE
Resp Syncytial Virus by PCR: NEGATIVE
SARS Coronavirus 2 by RT PCR: NEGATIVE

## 2022-08-22 LAB — LIPASE, BLOOD: Lipase: 29 U/L (ref 11–51)

## 2022-08-22 MED ORDER — IOHEXOL 350 MG/ML SOLN: 100.0000 mL | Freq: Once | INTRAVENOUS | Status: DC | PRN

## 2022-08-22 MED ORDER — ONDANSETRON 4 MG PO TBDP: 4.0000 mg | ORAL_TABLET | Freq: Three times a day (TID) | ORAL | 0 refills | Status: DC | PRN

## 2022-08-22 MED ORDER — ONDANSETRON HCL 4 MG/2ML IJ SOLN
4.0000 mg | Freq: Once | INTRAMUSCULAR | Status: AC
  Administered 2022-08-22: 4 mg via INTRAVENOUS
  Filled 2022-08-22: qty 2

## 2022-08-22 MED ORDER — IOHEXOL 350 MG/ML SOLN
80.0000 mL | Freq: Once | INTRAVENOUS | Status: AC | PRN
  Administered 2022-08-22: 80 mL via INTRAVENOUS

## 2022-08-22 MED ORDER — SODIUM CHLORIDE 0.9 % IV BOLUS
500.0000 mL | Freq: Once | INTRAVENOUS | Status: AC
  Administered 2022-08-22: 500 mL via INTRAVENOUS

## 2022-08-22 NOTE — ED Triage Notes (Signed)
Pt presents via POV c/o vomiting over the last day and intermittent diarrhea x1 week.

## 2022-08-22 NOTE — ED Provider Notes (Signed)
[facility] Provider Note    Event Date/Time   First MD Initiated Contact with Patient 08/22/22 1127     (approximate)   History   Emesis   HPI  Tina Curtis is a 89 y.o. female with CKD, hypertension, anemia, diabetes who comes in with concerns for vomiting.  Patient reports having some intermittent diarrhea over the past few days.  However more recently she comes in due to vomiting that started overnight.  She reports 4-5 episodes of nonbloody nonbilious vomiting.  She reports having abdominal discomfort as well as some chest discomfort however she reports the chest pain is more chronic in nature and the abdominal pain is the more new one.  She does report a mild headache similar to priors as well.  She is on Plavix.  She reports trying to take her blood pressure medicine but unable to keep them down and she vomited them up this morning.  She is on hydralazine, Lasix for that   Physical Exam   Triage Vital Signs: ED Triage Vitals [08/22/22 1108]  Enc Vitals Group     BP (!) 224/87     Pulse Rate 80     Resp 16     Temp 98.6 F (37 C)     Temp Source Oral     SpO2 99 %     Weight      Height      Head Circumference      Peak Flow      Pain Score 0     Pain Loc      Pain Edu?      Excl. in GC?     Most recent vital signs: Vitals:   08/22/22 1108 08/22/22 1135  BP: (!) 224/87 (!) 186/83  Pulse: 80 72  Resp: 16   Temp: 98.6 F (37 C)   SpO2: 99% 96%     General: Awake, no distress.  CV:  Good peripheral perfusion.  Resp:  Normal effort.  Abd:  No distention.  Tender in her abdomen Other:  Equal strength in arms and legs.  Sensation intact.  Good distal pulses throughout.   ED Results / Procedures / Treatments   Labs (all labs ordered are listed, but only abnormal results are displayed) Labs Reviewed  COMPREHENSIVE METABOLIC PANEL - Abnormal; Notable for the following components:      Result Value   Glucose, Bld 164 (*)     Creatinine, Ser 1.22 (*)    Calcium 8.4 (*)    AST 50 (*)    GFR, Estimated 41 (*)    All other components within normal limits  LIPASE, BLOOD  CBC  URINALYSIS, ROUTINE W REFLEX MICROSCOPIC  TROPONIN I (HIGH SENSITIVITY)     EKG  My interpretation of EKG:  Normal sinus rate of 63 without any ST elevation or T wave inversions, normal intervals  RADIOLOGY I have reviewed the CT head personally and interpretted    PROCEDURES:  Critical Care performed: No  .1-3 Lead EKG Interpretation  Performed by: Concha Se, MD Authorized by: Concha Se, MD     Interpretation: normal     ECG rate:  70   ECG rate assessment: normal     Rhythm: sinus rhythm     Ectopy: none     Conduction: normal      MEDICATIONS ORDERED IN ED: Medications  sodium chloride 0.9 % bolus 500 mL (500 mLs Intravenous New Bag/Given 08/22/22 1149)  ondansetron (ZOFRAN) injection 4 mg (4 mg Intravenous Given 08/22/22 1149)     IMPRESSION / MDM / ASSESSMENT AND PLAN / ED COURSE  I reviewed the triage vital signs and the nursing notes.   Patient's presentation is most consistent with acute presentation with potential threat to life or bodily function.   Patient comes in with significant hypertension with headache, abdominal pain and she reports some chronic chest pain.  Seems less likely dissection but given she is significantly hypertensive we will proceed with CT head, CT dissection, COVID, urine.  This could just be gastroenteritis as well.  Patient given a small amount of fluids and Zofran  Lipase is normal.  CMP shows creatinine around baseline at 1.22.  Glucose 164 no anion gap.  CBC reassuring  IMPRESSION: 1. No evidence of acute aortic syndrome. 2. Unchanged infrarenal abdominal aortic aneurysm measuring up to 3.6 cm in diameter. Recommend follow-up ultrasound every 2 years. 3. 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 No evidence of bowel ischemia. 4. Multiple cystic  pancreatic lesions measuring up to 2.6 cm. Given the patient's age, no routine follow-up is recommended. 5.  Aortic atherosclerosis (ICD10-I70.0).   Trop is downtrending 19--18 UA negative Lipase normal  This with patient incidental finding she was aware of the stenosis.  Her repeat abdominal exam is soft and nontender she reports only feeling gas earlier.  Denies any blood in her stool.  There is no evidence of bowel ischemia.  This stenosis is unchanged.  She understands however that she is high risk for issues happening in her abdomen and to return if she develops pain in her abdomen or blood in her stools but this time she denies any pain.  She also reports feeling better.  Her workup is otherwise reassuring.  Offered to give her her blood pressure medicine and she states that she can just take them when she gets home.  She would like to do a p.o. challenge and be discharged and prescribed some Zofran.  Patient does report following Dr. Wyn Quaker for her vascular issues and they are monitoring the stenosis and aneurysm  She has had no diarrhea here to send for stool testing.  Discussed with patient and if she continues to have diarrhea she can follow-up outpatient.  At this time I suspect more likely viral illness.  Her repeat blood pressure is 169/81.  I considered admission given patient's age and given negative workup and patient is appearing better patient feels comfortable with discharge home understands return precautions  The patient is on the cardiac monitor to evaluate for evidence of arrhythmia and/or significant heart rate changes.      FINAL CLINICAL IMPRESSION(S) / ED DIAGNOSES   Final diagnoses:  Viral gastroenteritis     Rx / DC Orders   ED Discharge Orders          Ordered    ondansetron (ZOFRAN-ODT) 4 MG disintegrating tablet  Every 8 hours PRN        08/22/22 1443             Note:  This document was prepared using Dragon voice recognition software and may  include unintentional dictation errors.   Concha Se, MD 08/22/22 1444

## 2022-08-22 NOTE — Discharge Instructions (Addendum)
Incidental findings as below.  Your workup was reassuring with stable findings.  Your repeat exam you are nontender and feeling better without any exertional vomiting therefore suspect this is most likely a viral illness.  Continue to monitor your symptoms at home and return for abdominal pain fevers, blood in your stool or any other concerns.  You can follow-up with Dr. Wyn Quaker your vascular surgeon for your CT as needed  IMPRESSION: 1. No evidence of acute aortic syndrome. 2. Unchanged infrarenal abdominal aortic aneurysm measuring up to 3.6 cm in diameter. Recommend follow-up ultrasound every 2 years. 3. 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 No evidence of bowel ischemia. 4. Multiple cystic pancreatic lesions measuring up to 2.6 cm. Given the patient's age, no routine follow-up is recommended. 5.  Aortic atherosclerosis (ICD10-I70.0).   IMPRESSION: No acute intracranial abnormality.   Stable mild cerebral atrophy and chronic small vessel disease.

## 2022-08-26 ENCOUNTER — Other Ambulatory Visit: Payer: Self-pay

## 2022-08-26 ENCOUNTER — Emergency Department: Payer: Medicare PPO

## 2022-08-26 ENCOUNTER — Encounter: Payer: Self-pay | Admitting: Emergency Medicine

## 2022-08-26 ENCOUNTER — Inpatient Hospital Stay
Admission: EM | Admit: 2022-08-26 | Discharge: 2022-09-02 | DRG: 871 | Disposition: A | Discharge status: Skilled Nursing Facility | Payer: Medicare PPO | Attending: Internal Medicine | Admitting: Internal Medicine

## 2022-08-26 ENCOUNTER — Inpatient Hospital Stay: Payer: Medicare PPO

## 2022-08-26 ENCOUNTER — Inpatient Hospital Stay
Admit: 2022-08-26 | Discharge: 2022-08-26 | Disposition: A | Discharge status: Home / Self Care | Payer: Medicare PPO | Attending: Internal Medicine | Admitting: Internal Medicine

## 2022-08-26 DIAGNOSIS — I5031 Acute diastolic (congestive) heart failure: Secondary | ICD-10-CM

## 2022-08-26 DIAGNOSIS — J439 Emphysema, unspecified: Secondary | ICD-10-CM | POA: Diagnosis present

## 2022-08-26 DIAGNOSIS — I160 Hypertensive urgency: Secondary | ICD-10-CM | POA: Diagnosis present

## 2022-08-26 DIAGNOSIS — B955 Unspecified streptococcus as the cause of diseases classified elsewhere: Secondary | ICD-10-CM

## 2022-08-26 DIAGNOSIS — M109 Gout, unspecified: Secondary | ICD-10-CM | POA: Diagnosis present

## 2022-08-26 DIAGNOSIS — Z8249 Family history of ischemic heart disease and other diseases of the circulatory system: Secondary | ICD-10-CM

## 2022-08-26 DIAGNOSIS — D631 Anemia in chronic kidney disease: Secondary | ICD-10-CM | POA: Diagnosis present

## 2022-08-26 DIAGNOSIS — M545 Low back pain, unspecified: Secondary | ICD-10-CM | POA: Diagnosis present

## 2022-08-26 DIAGNOSIS — Z66 Do not resuscitate: Secondary | ICD-10-CM | POA: Diagnosis present

## 2022-08-26 DIAGNOSIS — A408 Other streptococcal sepsis: Principal | ICD-10-CM | POA: Diagnosis present

## 2022-08-26 DIAGNOSIS — R079 Chest pain, unspecified: Secondary | ICD-10-CM | POA: Diagnosis present

## 2022-08-26 DIAGNOSIS — I5033 Acute on chronic diastolic (congestive) heart failure: Secondary | ICD-10-CM | POA: Diagnosis not present

## 2022-08-26 DIAGNOSIS — N2581 Secondary hyperparathyroidism of renal origin: Secondary | ICD-10-CM | POA: Diagnosis present

## 2022-08-26 DIAGNOSIS — E1140 Type 2 diabetes mellitus with diabetic neuropathy, unspecified: Secondary | ICD-10-CM | POA: Diagnosis present

## 2022-08-26 DIAGNOSIS — C50211 Malignant neoplasm of upper-inner quadrant of right female breast: Secondary | ICD-10-CM | POA: Diagnosis present

## 2022-08-26 DIAGNOSIS — N179 Acute kidney failure, unspecified: Secondary | ICD-10-CM | POA: Diagnosis present

## 2022-08-26 DIAGNOSIS — D62 Acute posthemorrhagic anemia: Secondary | ICD-10-CM | POA: Diagnosis not present

## 2022-08-26 DIAGNOSIS — E7800 Pure hypercholesterolemia, unspecified: Secondary | ICD-10-CM | POA: Diagnosis present

## 2022-08-26 DIAGNOSIS — Z803 Family history of malignant neoplasm of breast: Secondary | ICD-10-CM

## 2022-08-26 DIAGNOSIS — I959 Hypotension, unspecified: Secondary | ICD-10-CM | POA: Diagnosis present

## 2022-08-26 DIAGNOSIS — E876 Hypokalemia: Secondary | ICD-10-CM | POA: Diagnosis present

## 2022-08-26 DIAGNOSIS — I10 Essential (primary) hypertension: Secondary | ICD-10-CM | POA: Diagnosis not present

## 2022-08-26 DIAGNOSIS — I5032 Chronic diastolic (congestive) heart failure: Secondary | ICD-10-CM | POA: Diagnosis present

## 2022-08-26 DIAGNOSIS — K529 Noninfective gastroenteritis and colitis, unspecified: Secondary | ICD-10-CM | POA: Diagnosis not present

## 2022-08-26 DIAGNOSIS — I130 Hypertensive heart and chronic kidney disease with heart failure and stage 1 through stage 4 chronic kidney disease, or unspecified chronic kidney disease: Secondary | ICD-10-CM | POA: Diagnosis present

## 2022-08-26 DIAGNOSIS — J9601 Acute respiratory failure with hypoxia: Secondary | ICD-10-CM

## 2022-08-26 DIAGNOSIS — R0902 Hypoxemia: Secondary | ICD-10-CM

## 2022-08-26 DIAGNOSIS — Z7984 Long term (current) use of oral hypoglycemic drugs: Secondary | ICD-10-CM

## 2022-08-26 DIAGNOSIS — N1832 Chronic kidney disease, stage 3b: Secondary | ICD-10-CM | POA: Diagnosis present

## 2022-08-26 DIAGNOSIS — Z170 Estrogen receptor positive status [ER+]: Secondary | ICD-10-CM

## 2022-08-26 DIAGNOSIS — Z79899 Other long term (current) drug therapy: Secondary | ICD-10-CM

## 2022-08-26 DIAGNOSIS — J302 Other seasonal allergic rhinitis: Secondary | ICD-10-CM | POA: Diagnosis present

## 2022-08-26 DIAGNOSIS — I2510 Atherosclerotic heart disease of native coronary artery without angina pectoris: Secondary | ICD-10-CM | POA: Diagnosis present

## 2022-08-26 DIAGNOSIS — A0839 Other viral enteritis: Secondary | ICD-10-CM | POA: Diagnosis present

## 2022-08-26 DIAGNOSIS — J189 Pneumonia, unspecified organism: Secondary | ICD-10-CM | POA: Diagnosis not present

## 2022-08-26 DIAGNOSIS — F32A Depression, unspecified: Secondary | ICD-10-CM | POA: Diagnosis present

## 2022-08-26 DIAGNOSIS — I4891 Unspecified atrial fibrillation: Secondary | ICD-10-CM

## 2022-08-26 DIAGNOSIS — A409 Streptococcal sepsis, unspecified: Secondary | ICD-10-CM | POA: Diagnosis not present

## 2022-08-26 DIAGNOSIS — I081 Rheumatic disorders of both mitral and tricuspid valves: Secondary | ICD-10-CM | POA: Diagnosis present

## 2022-08-26 DIAGNOSIS — Z961 Presence of intraocular lens: Secondary | ICD-10-CM | POA: Diagnosis present

## 2022-08-26 DIAGNOSIS — I7143 Infrarenal abdominal aortic aneurysm, without rupture: Secondary | ICD-10-CM | POA: Diagnosis present

## 2022-08-26 DIAGNOSIS — K920 Hematemesis: Principal | ICD-10-CM | POA: Diagnosis present

## 2022-08-26 DIAGNOSIS — N184 Chronic kidney disease, stage 4 (severe): Secondary | ICD-10-CM

## 2022-08-26 DIAGNOSIS — R54 Age-related physical debility: Secondary | ICD-10-CM | POA: Diagnosis present

## 2022-08-26 DIAGNOSIS — Z90710 Acquired absence of both cervix and uterus: Secondary | ICD-10-CM

## 2022-08-26 DIAGNOSIS — I701 Atherosclerosis of renal artery: Secondary | ICD-10-CM | POA: Diagnosis present

## 2022-08-26 DIAGNOSIS — Z9013 Acquired absence of bilateral breasts and nipples: Secondary | ICD-10-CM

## 2022-08-26 DIAGNOSIS — I700 Atherosclerosis of aorta: Secondary | ICD-10-CM | POA: Diagnosis present

## 2022-08-26 DIAGNOSIS — J40 Bronchitis, not specified as acute or chronic: Secondary | ICD-10-CM

## 2022-08-26 DIAGNOSIS — M810 Age-related osteoporosis without current pathological fracture: Secondary | ICD-10-CM | POA: Diagnosis present

## 2022-08-26 DIAGNOSIS — M1990 Unspecified osteoarthritis, unspecified site: Secondary | ICD-10-CM | POA: Diagnosis present

## 2022-08-26 DIAGNOSIS — I491 Atrial premature depolarization: Secondary | ICD-10-CM | POA: Diagnosis present

## 2022-08-26 DIAGNOSIS — I214 Non-ST elevation (NSTEMI) myocardial infarction: Secondary | ICD-10-CM | POA: Diagnosis present

## 2022-08-26 DIAGNOSIS — Z7982 Long term (current) use of aspirin: Secondary | ICD-10-CM

## 2022-08-26 DIAGNOSIS — Z85828 Personal history of other malignant neoplasm of skin: Secondary | ICD-10-CM

## 2022-08-26 DIAGNOSIS — Z888 Allergy status to other drugs, medicaments and biological substances status: Secondary | ICD-10-CM

## 2022-08-26 DIAGNOSIS — Z79811 Long term (current) use of aromatase inhibitors: Secondary | ICD-10-CM

## 2022-08-26 DIAGNOSIS — R7881 Bacteremia: Secondary | ICD-10-CM | POA: Diagnosis not present

## 2022-08-26 DIAGNOSIS — J154 Pneumonia due to other streptococci: Secondary | ICD-10-CM | POA: Diagnosis present

## 2022-08-26 DIAGNOSIS — G2581 Restless legs syndrome: Secondary | ICD-10-CM | POA: Diagnosis present

## 2022-08-26 DIAGNOSIS — R6520 Severe sepsis without septic shock: Secondary | ICD-10-CM | POA: Diagnosis present

## 2022-08-26 DIAGNOSIS — E1122 Type 2 diabetes mellitus with diabetic chronic kidney disease: Secondary | ICD-10-CM | POA: Diagnosis present

## 2022-08-26 DIAGNOSIS — Z7902 Long term (current) use of antithrombotics/antiplatelets: Secondary | ICD-10-CM

## 2022-08-26 DIAGNOSIS — Z880 Allergy status to penicillin: Secondary | ICD-10-CM

## 2022-08-26 DIAGNOSIS — I5A Non-ischemic myocardial injury (non-traumatic): Secondary | ICD-10-CM | POA: Diagnosis not present

## 2022-08-26 DIAGNOSIS — E1129 Type 2 diabetes mellitus with other diabetic kidney complication: Secondary | ICD-10-CM | POA: Diagnosis present

## 2022-08-26 DIAGNOSIS — A084 Viral intestinal infection, unspecified: Secondary | ICD-10-CM | POA: Diagnosis not present

## 2022-08-26 DIAGNOSIS — R21 Rash and other nonspecific skin eruption: Secondary | ICD-10-CM | POA: Diagnosis present

## 2022-08-26 LAB — COMPREHENSIVE METABOLIC PANEL
ALT: 35 U/L (ref 0–44)
AST: 56 U/L — ABNORMAL HIGH (ref 15–41)
Albumin: 3.9 g/dL (ref 3.5–5.0)
Alkaline Phosphatase: 72 U/L (ref 38–126)
Anion gap: 12 (ref 5–15)
BUN: 21 mg/dL (ref 8–23)
CO2: 21 mmol/L — ABNORMAL LOW (ref 22–32)
Calcium: 7.3 mg/dL — ABNORMAL LOW (ref 8.9–10.3)
Chloride: 106 mmol/L (ref 98–111)
Creatinine, Ser: 1.34 mg/dL — ABNORMAL HIGH (ref 0.44–1.00)
GFR, Estimated: 37 mL/min — ABNORMAL LOW (ref >60)
Glucose, Bld: 182 mg/dL — ABNORMAL HIGH (ref 70–99)
Potassium: 2.5 mmol/L — CL (ref 3.5–5.1)
Sodium: 139 mmol/L (ref 135–145)
Total Bilirubin: 1.1 mg/dL (ref 0.3–1.2)
Total Protein: 7.4 g/dL (ref 6.5–8.1)

## 2022-08-26 LAB — CBG MONITORING, ED
Glucose-Capillary: 114 mg/dL — ABNORMAL HIGH (ref 70–99)
Glucose-Capillary: 124 mg/dL — ABNORMAL HIGH (ref 70–99)
Glucose-Capillary: 105 mg/dL — ABNORMAL HIGH (ref 70–99)

## 2022-08-26 LAB — CBC WITH DIFFERENTIAL/PLATELET
Abs Immature Granulocytes: 0.01 10*3/uL (ref 0.00–0.07)
Basophils Absolute: 0 10*3/uL (ref 0.0–0.1)
Basophils Relative: 0 %
Eosinophils Absolute: 0.1 10*3/uL (ref 0.0–0.5)
Eosinophils Relative: 1 %
HCT: 35.1 % — ABNORMAL LOW (ref 36.0–46.0)
Hemoglobin: 11.9 g/dL — ABNORMAL LOW (ref 12.0–15.0)
Immature Granulocytes: 0 %
Lymphocytes Relative: 15 %
Lymphs Abs: 0.9 10*3/uL (ref 0.7–4.0)
MCH: 31.2 pg (ref 26.0–34.0)
MCHC: 33.9 g/dL (ref 30.0–36.0)
MCV: 92.1 fL (ref 80.0–100.0)
Monocytes Absolute: 0.1 10*3/uL (ref 0.1–1.0)
Monocytes Relative: 1 %
Neutro Abs: 5.3 10*3/uL (ref 1.7–7.7)
Neutrophils Relative %: 83 %
Platelets: 176 10*3/uL (ref 150–400)
RBC: 3.81 MIL/uL — ABNORMAL LOW (ref 3.87–5.11)
RDW: 14.2 % (ref 11.5–15.5)
WBC: 6.4 10*3/uL (ref 4.0–10.5)
nRBC: 0 % (ref 0.0–0.2)

## 2022-08-26 LAB — BASIC METABOLIC PANEL
Anion gap: 13 (ref 5–15)
BUN: 18 mg/dL (ref 8–23)
CO2: 17 mmol/L — ABNORMAL LOW (ref 22–32)
Calcium: 6.5 mg/dL — ABNORMAL LOW (ref 8.9–10.3)
Chloride: 106 mmol/L (ref 98–111)
Creatinine, Ser: 1.48 mg/dL — ABNORMAL HIGH (ref 0.44–1.00)
GFR, Estimated: 33 mL/min — ABNORMAL LOW (ref >60)
Glucose, Bld: 157 mg/dL — ABNORMAL HIGH (ref 70–99)
Potassium: 3.4 mmol/L — ABNORMAL LOW (ref 3.5–5.1)
Sodium: 136 mmol/L (ref 135–145)

## 2022-08-26 LAB — PHOSPHORUS
Phosphorus: 1.7 mg/dL — ABNORMAL LOW (ref 2.5–4.6)
Phosphorus: 4.8 mg/dL — ABNORMAL HIGH (ref 2.5–4.6)

## 2022-08-26 LAB — LIPASE, BLOOD: Lipase: 26 U/L (ref 11–51)

## 2022-08-26 LAB — CBC
HCT: 32 % — ABNORMAL LOW (ref 36.0–46.0)
HCT: 31.4 % — ABNORMAL LOW (ref 36.0–46.0)
Hemoglobin: 10.8 g/dL — ABNORMAL LOW (ref 12.0–15.0)
Hemoglobin: 10.6 g/dL — ABNORMAL LOW (ref 12.0–15.0)
MCH: 31.2 pg (ref 26.0–34.0)
MCH: 31.2 pg (ref 26.0–34.0)
MCHC: 33.8 g/dL (ref 30.0–36.0)
MCHC: 33.8 g/dL (ref 30.0–36.0)
MCV: 92.5 fL (ref 80.0–100.0)
MCV: 92.4 fL (ref 80.0–100.0)
Platelets: 170 10*3/uL (ref 150–400)
Platelets: 166 10*3/uL (ref 150–400)
RBC: 3.46 MIL/uL — ABNORMAL LOW (ref 3.87–5.11)
RBC: 3.4 MIL/uL — ABNORMAL LOW (ref 3.87–5.11)
RDW: 14.1 % (ref 11.5–15.5)
RDW: 14.3 % (ref 11.5–15.5)
WBC: 8.3 10*3/uL (ref 4.0–10.5)
WBC: 10.4 10*3/uL (ref 4.0–10.5)
nRBC: 0 % (ref 0.0–0.2)
nRBC: 0 % (ref 0.0–0.2)

## 2022-08-26 LAB — APTT: aPTT: 33 seconds (ref 24–36)

## 2022-08-26 LAB — LIPID PANEL
Cholesterol: 97 mg/dL (ref 0–200)
HDL: 27 mg/dL — ABNORMAL LOW (ref >40)
LDL Cholesterol: 41 mg/dL (ref 0–99)
Total CHOL/HDL Ratio: 3.6 RATIO
Triglycerides: 147 mg/dL (ref <150)
VLDL: 29 mg/dL (ref 0–40)

## 2022-08-26 LAB — TROPONIN I (HIGH SENSITIVITY)
Troponin I (High Sensitivity): 88 ng/L — ABNORMAL HIGH (ref <18)
Troponin I (High Sensitivity): 856 ng/L (ref <18)

## 2022-08-26 LAB — MAGNESIUM: Magnesium: 1.4 mg/dL — ABNORMAL LOW (ref 1.7–2.4)

## 2022-08-26 LAB — LACTIC ACID, PLASMA
Lactic Acid, Venous: 1.8 mmol/L (ref 0.5–1.9)
Lactic Acid, Venous: 1.5 mmol/L (ref 0.5–1.9)

## 2022-08-26 LAB — TYPE AND SCREEN
ABO/RH(D): B POS
Antibody Screen: NEGATIVE

## 2022-08-26 LAB — HEMOGLOBIN A1C
Hgb A1c MFr Bld: 7 % — ABNORMAL HIGH (ref 4.8–5.6)
Mean Plasma Glucose: 154.2 mg/dL

## 2022-08-26 LAB — D-DIMER, QUANTITATIVE: D-Dimer, Quant: 1.78 ug/mL-FEU — ABNORMAL HIGH (ref 0.00–0.50)

## 2022-08-26 LAB — BRAIN NATRIURETIC PEPTIDE: B Natriuretic Peptide: 621 pg/mL — ABNORMAL HIGH (ref 0.0–100.0)

## 2022-08-26 LAB — PROTIME-INR
INR: 1.5 — ABNORMAL HIGH (ref 0.8–1.2)
Prothrombin Time: 18.1 seconds — ABNORMAL HIGH (ref 11.4–15.2)

## 2022-08-26 LAB — PROCALCITONIN: Procalcitonin: 40.14 ng/mL

## 2022-08-26 MED ORDER — TECHNETIUM TO 99M ALBUMIN AGGREGATED
4.0000 | Freq: Once | INTRAVENOUS | Status: AC | PRN
  Administered 2022-08-26: 4.17 via INTRAVENOUS

## 2022-08-26 MED ORDER — INSULIN ASPART 100 UNIT/ML IJ SOLN: 0.0000 [IU] | Freq: Three times a day (TID) | INTRAMUSCULAR | Status: DC

## 2022-08-26 MED ORDER — SIMVASTATIN 10 MG PO TABS: 20.0000 mg | ORAL_TABLET | Freq: Every evening | ORAL | Status: DC

## 2022-08-26 MED ORDER — HYDRALAZINE HCL 25 MG PO TABS
25.0000 mg | ORAL_TABLET | Freq: Three times a day (TID) | ORAL | Status: DC
  Administered 2022-08-26 – 2022-09-02 (×18): 25 mg via ORAL
  Filled 2022-08-26 (×20): qty 1

## 2022-08-26 MED ORDER — METOPROLOL SUCCINATE ER 25 MG PO TB24
37.5000 mg | ORAL_TABLET | Freq: Every day | ORAL | Status: DC
  Administered 2022-08-26 – 2022-09-01 (×7): 37.5 mg via ORAL
  Filled 2022-08-26 (×7): qty 2

## 2022-08-26 MED ORDER — POTASSIUM CHLORIDE 10 MEQ/100ML IV SOLN
10.0000 meq | Freq: Once | INTRAVENOUS | Status: DC
  Filled 2022-08-26: qty 100

## 2022-08-26 MED ORDER — INSULIN ASPART 100 UNIT/ML IJ SOLN
0.0000 [IU] | Freq: Three times a day (TID) | INTRAMUSCULAR | Status: DC
  Administered 2022-08-27 (×2): 1 [IU] via SUBCUTANEOUS
  Administered 2022-08-29 – 2022-08-30 (×2): 2 [IU] via SUBCUTANEOUS
  Administered 2022-08-30: 1 [IU] via SUBCUTANEOUS
  Administered 2022-08-31 – 2022-09-01 (×2): 2 [IU] via SUBCUTANEOUS
  Administered 2022-09-02: 1 [IU] via SUBCUTANEOUS
  Filled 2022-08-26 (×7): qty 1

## 2022-08-26 MED ORDER — CALCIUM GLUCONATE-NACL 1-0.675 GM/50ML-% IV SOLN
1.0000 g | Freq: Once | INTRAVENOUS | Status: AC
  Administered 2022-08-26: 1000 mg via INTRAVENOUS
  Filled 2022-08-26: qty 50

## 2022-08-26 MED ORDER — POTASSIUM PHOSPHATES 15 MMOLE/5ML IV SOLN
30.0000 mmol | Freq: Once | INTRAVENOUS | Status: AC
  Administered 2022-08-26: 30 mmol via INTRAVENOUS
  Filled 2022-08-26: qty 10

## 2022-08-26 MED ORDER — MAGNESIUM SULFATE 2 GM/50ML IV SOLN
2.0000 g | Freq: Once | INTRAVENOUS | Status: AC
  Administered 2022-08-26: 2 g via INTRAVENOUS
  Filled 2022-08-26: qty 50

## 2022-08-26 MED ORDER — HYDRALAZINE HCL 20 MG/ML IJ SOLN: 5.0000 mg | INTRAMUSCULAR | Status: DC | PRN

## 2022-08-26 MED ORDER — ACETAMINOPHEN 650 MG RE SUPP
975.0000 mg | Freq: Once | RECTAL | Status: AC
  Administered 2022-08-26: 975 mg via RECTAL
  Filled 2022-08-26: qty 2

## 2022-08-26 MED ORDER — ACETAMINOPHEN 325 MG PO TABS
650.0000 mg | ORAL_TABLET | Freq: Four times a day (QID) | ORAL | Status: DC | PRN
  Administered 2022-08-27 – 2022-09-01 (×5): 650 mg via ORAL
  Filled 2022-08-26 (×5): qty 2

## 2022-08-26 MED ORDER — ONDANSETRON HCL 4 MG/2ML IJ SOLN
4.0000 mg | Freq: Three times a day (TID) | INTRAMUSCULAR | Status: DC | PRN
  Administered 2022-08-26 – 2022-08-29 (×5): 4 mg via INTRAVENOUS
  Filled 2022-08-26 (×6): qty 2

## 2022-08-26 MED ORDER — NITROGLYCERIN 0.4 MG SL SUBL: 0.4000 mg | SUBLINGUAL_TABLET | SUBLINGUAL | Status: DC | PRN

## 2022-08-26 MED ORDER — POTASSIUM CHLORIDE 10 MEQ/100ML IV SOLN
10.0000 meq | Freq: Once | INTRAVENOUS | Status: AC
  Administered 2022-08-26: 10 meq via INTRAVENOUS

## 2022-08-26 MED ORDER — GABAPENTIN 300 MG PO CAPS
300.0000 mg | ORAL_CAPSULE | Freq: Every day | ORAL | Status: DC
  Administered 2022-08-26 – 2022-09-01 (×7): 300 mg via ORAL
  Filled 2022-08-26 (×7): qty 1

## 2022-08-26 MED ORDER — PANTOPRAZOLE 80MG IVPB - SIMPLE MED
80.0000 mg | Freq: Once | INTRAVENOUS | Status: AC
  Administered 2022-08-26: 80 mg via INTRAVENOUS
  Filled 2022-08-26: qty 100

## 2022-08-26 MED ORDER — INSULIN ASPART 100 UNIT/ML IJ SOLN
0.0000 [IU] | Freq: Every day | INTRAMUSCULAR | Status: DC
  Filled 2022-08-26: qty 1

## 2022-08-26 MED ORDER — ANASTROZOLE 1 MG PO TABS
1.0000 mg | ORAL_TABLET | Freq: Every day | ORAL | Status: DC
  Administered 2022-08-27 – 2022-09-01 (×6): 1 mg via ORAL
  Filled 2022-08-26 (×9): qty 1

## 2022-08-26 MED ORDER — DULOXETINE HCL 30 MG PO CPEP
60.0000 mg | ORAL_CAPSULE | Freq: Every day | ORAL | Status: DC
  Administered 2022-08-27 – 2022-09-02 (×7): 60 mg via ORAL
  Filled 2022-08-26 (×5): qty 2
  Filled 2022-08-26: qty 1
  Filled 2022-08-26: qty 2

## 2022-08-26 MED ORDER — METOPROLOL TARTRATE 5 MG/5ML IV SOLN
2.5000 mg | Freq: Once | INTRAVENOUS | Status: AC
  Administered 2022-08-26: 2.5 mg via INTRAVENOUS
  Filled 2022-08-26: qty 5

## 2022-08-26 MED ORDER — PANTOPRAZOLE INFUSION (NEW) - SIMPLE MED
8.0000 mg/h | INTRAVENOUS | Status: DC
  Administered 2022-08-26: 8 mg/h via INTRAVENOUS
  Filled 2022-08-26: qty 100

## 2022-08-26 MED ORDER — ALBUTEROL SULFATE (2.5 MG/3ML) 0.083% IN NEBU: 3.0000 mL | INHALATION_SOLUTION | RESPIRATORY_TRACT | Status: DC | PRN

## 2022-08-26 MED ORDER — VITAMIN B-12 1000 MCG PO TABS
1000.0000 ug | ORAL_TABLET | Freq: Every day | ORAL | Status: DC
  Administered 2022-08-27 – 2022-09-02 (×7): 1000 ug via ORAL
  Filled 2022-08-26 (×3): qty 1
  Filled 2022-08-26: qty 2
  Filled 2022-08-26 (×3): qty 1

## 2022-08-26 MED ORDER — DOCUSATE SODIUM 100 MG PO CAPS: 100.0000 mg | ORAL_CAPSULE | Freq: Every day | ORAL | Status: DC | PRN

## 2022-08-26 MED ORDER — ATORVASTATIN CALCIUM 20 MG PO TABS
40.0000 mg | ORAL_TABLET | Freq: Every day | ORAL | Status: DC
  Administered 2022-08-27 – 2022-09-02 (×7): 40 mg via ORAL
  Filled 2022-08-26 (×7): qty 2

## 2022-08-26 MED ORDER — POTASSIUM CHLORIDE 10 MEQ/100ML IV SOLN
10.0000 meq | Freq: Once | INTRAVENOUS | Status: DC
  Administered 2022-08-26: 10 meq via INTRAVENOUS
  Filled 2022-08-26: qty 100

## 2022-08-26 MED ORDER — VITAMIN D 25 MCG (1000 UNIT) PO TABS
2000.0000 [IU] | ORAL_TABLET | Freq: Every day | ORAL | Status: DC
  Administered 2022-08-27 – 2022-09-02 (×7): 2000 [IU] via ORAL
  Filled 2022-08-26 (×10): qty 2

## 2022-08-26 MED ORDER — CITALOPRAM HYDROBROMIDE 20 MG PO TABS
10.0000 mg | ORAL_TABLET | Freq: Every day | ORAL | Status: DC
  Administered 2022-08-27 – 2022-09-02 (×7): 10 mg via ORAL
  Filled 2022-08-26 (×7): qty 1

## 2022-08-26 MED ORDER — LORATADINE 10 MG PO TABS: 10.0000 mg | ORAL_TABLET | Freq: Every day | ORAL | Status: DC | PRN

## 2022-08-26 MED ORDER — FUROSEMIDE 40 MG PO TABS: 40.0000 mg | ORAL_TABLET | Freq: Every day | ORAL | Status: DC

## 2022-08-26 MED ORDER — PANTOPRAZOLE SODIUM 40 MG IV SOLR
40.0000 mg | Freq: Two times a day (BID) | INTRAVENOUS | Status: DC
  Administered 2022-08-26 – 2022-08-29 (×6): 40 mg via INTRAVENOUS
  Filled 2022-08-26 (×6): qty 10

## 2022-08-26 MED ORDER — DM-GUAIFENESIN ER 30-600 MG PO TB12: 1.0000 | ORAL_TABLET | Freq: Two times a day (BID) | ORAL | Status: DC | PRN

## 2022-08-26 MED ORDER — ALLOPURINOL 100 MG PO TABS
100.0000 mg | ORAL_TABLET | Freq: Every day | ORAL | Status: DC
  Administered 2022-08-28 – 2022-09-02 (×6): 100 mg via ORAL
  Filled 2022-08-26 (×8): qty 1

## 2022-08-26 MED ORDER — MORPHINE SULFATE (PF) 2 MG/ML IV SOLN: 0.5000 mg | INTRAVENOUS | Status: DC | PRN

## 2022-08-26 MED ORDER — SODIUM CHLORIDE 0.9 % IV SOLN
1.0000 g | INTRAVENOUS | Status: DC
  Administered 2022-08-26: 1 g via INTRAVENOUS
  Filled 2022-08-26: qty 10

## 2022-08-26 MED ORDER — POTASSIUM CHLORIDE CRYS ER 20 MEQ PO TBCR
40.0000 meq | EXTENDED_RELEASE_TABLET | Freq: Once | ORAL | Status: AC
  Administered 2022-08-26: 40 meq via ORAL
  Filled 2022-08-26: qty 2

## 2022-08-26 MED ORDER — SODIUM CHLORIDE 0.9 % IV BOLUS
1000.0000 mL | Freq: Once | INTRAVENOUS | Status: AC
  Administered 2022-08-26: 1000 mL via INTRAVENOUS

## 2022-08-26 MED ORDER — SODIUM CHLORIDE 0.9 % IV SOLN
500.0000 mg | INTRAVENOUS | Status: DC
  Administered 2022-08-26: 500 mg via INTRAVENOUS
  Filled 2022-08-26: qty 5

## 2022-08-26 MED ORDER — INSULIN ASPART 100 UNIT/ML IJ SOLN: 0.0000 [IU] | Freq: Every day | INTRAMUSCULAR | Status: DC

## 2022-08-26 NOTE — H&P (Addendum)
History and Physical    Tina Curtis [name]:[id number] [name]: Sep 25, 1926 [name]: 08/26/2022  Referring MD/NP/PA:   PCP: [name], MD   Patient coming from:  The patient is coming from independent living facility  Chief Complaint: Hematemesis, nausea, vomiting, diarrhea and chest pain  HPI: Tina Curtis is a 89 y.o. female with medical history significant of hypertension, hyperlipidemia, dCHF, gout, depression, CKD-3B, neuropathy, C. difficile, breast cancer, who presents with hematemesis, nausea, vomiting, diarrhea and chest pain.  Pt has nausea, vomiting, diarrhea for more than 1 week. She has mild central abdominal discomfort, denies active abdominal pain. Patient was seen in ED on 4/13, and had CTA of chest/abdomen/pelvis and was diagnosed with gastroenteritis.  This morning, patient reported to ED physician that she had coffee-ground emesis, but she told me that she does not remember coffee-ground emesis. She continues to have loose stool bowel movement, no active nausea, vomiting or abdominal pain currently.  No fever or chills.  Patient also reports chest pain, which is located in front chest, sharp, mild to moderate, nonradiating, pleuritic, aggravated by deep breath patient. She also has some tenderness in the front chest wall on palpation.  She has mild dry cough, no shortness of breath.  Denies symptoms of UTI.  Patient is normally not using oxygen, but was found to have oxygen desaturation to 87-89% on room air, which improved to 90-92% on 2 L oxygen. Of note, pt has rash in her left chest wall and left upper arm, not itchy or painful.  Patient was recently started on Jardiance.   CTA of chest/abd/pelvis on 08/22/22: 1. No evidence of acute aortic syndrome. 2. Unchanged infrarenal abdominal aortic aneurysm measuring up to 3.6 cm in diameter. Recommend follow-up ultrasound every 2 years. 3. 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 No evidence of bowel ischemia. 4.  Multiple cystic pancreatic lesions measuring up to 2.6 cm. Given the patient's age, no routine follow-up is recommended. 5.  Aortic atherosclerosis (ICD10-I70.0).     Data reviewed independently and ED Course: pt was found to have Hgb 12.4 on 4/13/214 --> 11.9 --> 10.8, Trop 88, renal function close to baseline, potassium 2.5, magnesium 1.4, phosphorus 1.7, calcium 7.3, temperature 97.1, blood pressure 204/78, which improved to 164/68 after giving 2.5 mg of IV metoprolol in ED, HR 80s, RR 32.  Patient is admitted to PCU as inpatient.     CXR: 1. The appearance of the chest is concerning for probable acute bronchitis, as above. Some degree of underlying interstitial lung disease is also suspected. 2. Aortic atherosclerosis.   EKG: I have personally reviewed.  Initially suspected A-fib in ED, but per cardiologist, Dr. Darrold Junker and PA of card, Jodie Echevaria, EKG is not showing A fib. Seems to have PAC. Pt has early R wave progression, T wave inversion/ST depression in V3-V6.    Review of Systems:   General: no fevers, chills, no body weight gain, has poor appetite, has fatigue HEENT: no blurry vision, hearing changes or sore throat Respiratory: no dyspnea, has coughing, no wheezing CV: no chest pain, no palpitations GI: has nausea, vomiting, diarrhea, no constipation and abdominal pain GU: no dysuria, burning on urination, increased urinary frequency, hematuria  Ext: no leg edema Neuro: no unilateral weakness, numbness, or tingling, no vision change or hearing loss Skin: has erythematous rash on left upper chest and left upper arm MSK: No muscle spasm, no deformity, no limitation of range of movement in spin Heme: No easy bruising.  Travel  history: No recent long distant travel.   Allergy:  Allergies  Allergen Reactions   Penicillins Anaphylaxis   Drug Ingredient [Zinc]     Past Medical History:  Diagnosis Date   Arthritis    Gout   Chronic kidney disease    Diarrhea     Hypercholesteremia    Hypertension    Neuropathy    feet and lower legs   Seasonal allergies    Skin cancer    face    Past Surgical History:  Procedure Laterality Date   ABDOMINAL HYSTERECTOMY  2000   APPENDECTOMY     BREAST BIOPSY Right 09/19/2019   affirm bx of calcs UOQ, x marker, path pending   BREAST BIOPSY Right 09/19/2019   [place] bx of mass,heart marker, path pending   BREAST BIOPSY Right 09/19/2019   [place] bx of LN, coil marker, path pending   CATARACT EXTRACTION Left    CATARACT EXTRACTION W/PHACO Right 10/24/2014   Procedure: CATARACT EXTRACTION PHACO AND INTRAOCULAR LENS PLACEMENT (IOC);  Surgeon: Lockie Mola, MD;  Location: [facility];  Service: Ophthalmology;  Laterality: Right;   ESOPHAGOGASTRODUODENOSCOPY N/A 03/07/2018   Procedure: ESOPHAGOGASTRODUODENOSCOPY (EGD);  Surgeon: Toney Reil, MD;  Location: [facility];  Service: Gastroenterology;  Laterality: N/A;   MASTECTOMY MODIFIED RADICAL Right 10/13/2019   Procedure: MASTECTOMY MODIFIED RADICAL;  Surgeon: Earline Mayotte, MD;  Location: [facility] ORS;  Service: General;  Laterality: Right;   RENAL ANGIOGRAPHY Right 04/11/2018   Procedure: RENAL ANGIOGRAPHY;  Surgeon: Annice Needy, MD;  Location: [facility] INVASIVE CV LAB;  Service: Cardiovascular;  Laterality: Right;   SIMPLE MASTECTOMY WITH AXILLARY SENTINEL NODE BIOPSY Left 10/13/2019   Procedure: SIMPLE MASTECTOMY TRUE CUT BIOPSY, SENTINEL NODE BIOPSY;  Surgeon: Earline Mayotte, MD;  Location: [facility] ORS;  Service: General;  Laterality: Left;    Social History:  reports that she has never smoked. She has never used smokeless tobacco. She reports that she does not drink alcohol and does not use drugs.  Family History:  Family History  Problem Relation Age of Onset   Hypertension Mother    Hypertension Father    Stroke Father    Breast cancer Daughter 102     Prior to Admission medications   Medication Sig Start Date End Date Taking?  Authorizing Provider  albuterol (VENTOLIN HFA) 108 (90 Base) MCG/ACT inhaler Inhale 2 puffs into the lungs every 6 (six) hours as needed for wheezing or shortness of breath. 06/10/22   Lurene Shadow, MD  allopurinol (ZYLOPRIM) 100 MG tablet Take 100 mg by mouth daily. 01/16/20   [provider]  anastrozole (ARIMIDEX) 1 MG tablet TAKE 1 TABLET(1 MG) BY MOUTH DAILY. DO NOT. START IF RASH IS NOT BETTER 06/04/22   Earna Coder, MD  aspirin 81 MG EC tablet Take 81 mg by mouth daily. 10/24/20   [provider]  citalopram (CELEXA) 10 MG tablet Take 10 mg by mouth daily.  03/14/18   [provider]  clopidogrel (PLAVIX) 75 MG tablet TAKE 1 TABLET BY MOUTH EVERY DAY 05/16/19   Annice Needy, MD  cyanocobalamin (VITAMIN B12) 1000 MCG tablet Take 1,000 mcg by mouth daily.    [provider]  docusate sodium (COLACE) 100 MG capsule Take 100 mg by mouth daily as needed for mild constipation.    [provider]  DULoxetine (CYMBALTA) 60 MG capsule Take 60 mg by mouth daily.  11/11/18   [provider]  Ergocalciferol 50 MCG (2000  UT) TABS Take 2,000 Units by mouth daily. 01/09/15   [provider]  furosemide (LASIX) 40 MG tablet Take 1 tablet (40 mg total) by mouth daily. 06/10/22   Lurene Shadow, MD  gabapentin (NEURONTIN) 300 MG capsule Take 300 mg by mouth at bedtime.    [provider]  hydrALAZINE (APRESOLINE) 25 MG tablet Take 1 tablet by mouth 3 (three) times daily. 04/09/22 04/29/23  [provider]  loratadine (CLARITIN) 10 MG tablet Take 10 mg by mouth daily as needed for allergies. AM    [provider]  metoprolol succinate (TOPROL-XL) 25 MG 24 hr tablet Take 37.5 mg by mouth at bedtime.     [provider]  ondansetron (ZOFRAN-ODT) 4 MG disintegrating tablet Take 1 tablet (4 mg total) by mouth every 8 (eight) hours as needed for up to 5 days. 08/22/22 08/27/22  Concha Se, MD  simvastatin (ZOCOR) 20  MG tablet Take 20 mg by mouth every evening. PM    [provider]    Physical Exam: Vitals:   08/26/22 1130 08/26/22 1654 08/26/22 1656 08/26/22 1800  BP: (!) 129/49  131/77 (!) 160/70  Pulse: 84  81 86  Resp: 18  (!) 27 (!) 30  Temp:  98.2 F (36.8 C)    TempSrc:  Oral    SpO2: 90%  97% 94%  Weight:      Height:       General: Not in acute distress HEENT:       Eyes: PERRL, EOMI, no scleral icterus.       ENT: No discharge from the ears and nose, no pharynx injection, no tonsillar enlargement.        Neck: No JVD, no bruit, no mass felt. Heme: No neck lymph node enlargement. Cardiac: S1/S2, has premature beats,, No murmurs, No gallops or rubs. Respiratory: No rales, wheezing, rhonchi or rubs. GI: Soft, nondistended, nontender, no rebound pain, no organomegaly, BS present. GU: No hematuria Ext: No pitting leg edema bilaterally. 1+DP/PT pulse bilaterally. Musculoskeletal: No joint deformities, No joint redness or warmth, no limitation of ROM in spin.  Has tenderness in front chest wall on palpation Skin:  has erythematous rash on left upper chest and left upper arm Neuro: Alert, oriented X3, cranial nerves II-XII grossly intact, moves all extremities normally. Psych: Patient is not psychotic, no suicidal or hemocidal ideation.  Labs on Admission: I have personally reviewed following labs and imaging studies  CBC: Recent Labs  Lab 08/22/22 1110 08/26/22 0526 08/26/22 0754  WBC 9.0 6.4 8.3  NEUTROABS  --  5.3  --   HGB 12.4 11.9* 10.8*  HCT 38.4 35.1* 32.0*  MCV 94.3 92.1 92.5  PLT 206 176 170   Basic Metabolic Panel: Recent Labs  Lab 08/22/22 1110 08/26/22 0526  NA 142 139  K 3.8 2.5*  CL 105 106  CO2 25 21*  GLUCOSE 164* 182*  BUN 20 21  CREATININE 1.22* 1.34*  CALCIUM 8.4* 7.3*  MG  --  1.4*  PHOS  --  1.7*   GFR: Estimated Creatinine Clearance: 22.6 mL/min (A) (by C-G formula based on SCr of 1.34 mg/dL (H)). Liver Function Tests: Recent  Labs  Lab 08/22/22 1110 08/26/22 0526  AST 50* 56*  ALT 22 35  ALKPHOS 93 72  BILITOT 0.6 1.1  PROT 7.7 7.4  ALBUMIN 4.0 3.9   Recent Labs  Lab 08/22/22 1110 08/26/22 0526  LIPASE 29 26   No results for input(s): "AMMONIA"  in the last 168 hours. Coagulation Profile: Recent Labs  Lab 08/26/22 0526  INR 1.5*   Cardiac Enzymes: No results for input(s): "CKTOTAL", "CKMB", "CKMBINDEX", "TROPONINI" in the last 168 hours. BNP (last 3 results) No results for input(s): "PROBNP" in the last 8760 hours. HbA1C: No results for input(s): "HGBA1C" in the last 72 hours. CBG: Recent Labs  Lab 08/26/22 0801 08/26/22 1129  GLUCAP 114* 124*   Lipid Profile: Recent Labs    08/26/22 1200  CHOL 97  HDL 27*  LDLCALC 41  TRIG 161  CHOLHDL 3.6   Thyroid Function Tests: No results for input(s): "TSH", "T4TOTAL", "FREET4", "T3FREE", "THYROIDAB" in the last 72 hours. Anemia Panel: No results for input(s): "VITAMINB12", "FOLATE", "FERRITIN", "TIBC", "IRON", "RETICCTPCT" in the last 72 hours. Urine analysis:    Component Value Date/Time   COLORURINE STRAW (A) 08/22/2022 1318   APPEARANCEUR CLEAR (A) 08/22/2022 1318   LABSPEC 1.028 08/22/2022 1318   PHURINE 7.0 08/22/2022 1318   GLUCOSEU >=500 (A) 08/22/2022 1318   HGBUR NEGATIVE 08/22/2022 1318   BILIRUBINUR NEGATIVE 08/22/2022 1318   BILIRUBINUR negative 03/18/2022 1614   KETONESUR NEGATIVE 08/22/2022 1318   PROTEINUR 100 (A) 08/22/2022 1318   UROBILINOGEN 0.2 03/18/2022 1614   NITRITE NEGATIVE 08/22/2022 1318   LEUKOCYTESUR NEGATIVE 08/22/2022 1318   Sepsis Labs: @LABRCNTIP (procalcitonin:4,lacticidven:4) ) Recent Results (from the past 240 hour(s))  Resp panel by RT-PCR (RSV, Flu A&B, Covid) Anterior Nasal Swab     Status: None   Collection Time: 08/22/22 11:32 AM   Specimen: Anterior Nasal Swab  Result Value Ref Range Status   SARS Coronavirus 2 by RT PCR NEGATIVE NEGATIVE Final    Comment: (NOTE) SARS-CoV-2 target  nucleic acids are NOT DETECTED.  The SARS-CoV-2 RNA is generally detectable in upper respiratory specimens during the acute phase of infection. The lowest concentration of SARS-CoV-2 viral copies this assay can detect is 138 copies/mL. A negative result does not preclude SARS-Cov-2 infection and should not be used as the sole basis for treatment or other patient management decisions. A negative result may occur with  improper specimen collection/handling, submission of specimen other than nasopharyngeal swab, presence of viral mutation(s) within the areas targeted by this assay, and inadequate number of viral copies(<138 copies/mL). A negative result must be combined with clinical observations, patient history, and epidemiological information. The expected result is Negative.  Fact Sheet for Patients:  BloggerCourse.com  Fact Sheet for Healthcare Providers:  SeriousBroker.it  This test is no t yet approved or cleared by the [place] FDA and  has been authorized for detection and/or diagnosis of SARS-CoV-2 by FDA under an Emergency Use Authorization (EUA). This EUA will remain  in effect (meaning this test can be used) for the duration of the COVID-19 declaration under Section 564(b)(1) of the Act, 21 U.S.C.section 360bbb-3(b)(1), unless the authorization is terminated  or revoked sooner.       Influenza A by PCR NEGATIVE NEGATIVE Final   Influenza B by PCR NEGATIVE NEGATIVE Final    Comment: (NOTE) The Xpert Xpress SARS-CoV-2/FLU/RSV plus assay is intended as an aid in the diagnosis of influenza from Nasopharyngeal swab specimens and should not be used as a sole basis for treatment. Nasal washings and aspirates are unacceptable for Xpert Xpress SARS-CoV-2/FLU/RSV testing.  Fact Sheet for Patients: BloggerCourse.com  Fact Sheet for Healthcare  Providers: SeriousBroker.it  This test is not yet approved or cleared by the [place] FDA and has been authorized for detection and/or diagnosis of SARS-CoV-2  by FDA under an Emergency Use Authorization (EUA). This EUA will remain in effect (meaning this test can be used) for the duration of the COVID-19 declaration under Section 564(b)(1) of the Act, 21 U.S.C. section 360bbb-3(b)(1), unless the authorization is terminated or revoked.     Resp Syncytial Virus by PCR NEGATIVE NEGATIVE Final    Comment: (NOTE) Fact Sheet for Patients: https://www.BloggerCourse.comeet for Healthcare Providers: SeriousBroker.it  This test is not yet approved or cleared by the [place] FDA and has been authorized for detection and/or diagnosis of SARS-CoV-2 by FDA under an Emergency Use Authorization (EUA). This EUA will remain in effect (meaning this test can be used) for the duration of the COVID-19 declaration under Section 564(b)(1) of the Act, 21 U.S.C. section 360bbb-3(b)(1), unless the authorization is terminated or revoked.  Performed at [facility], [address] [place], [place] [address]      Radiological Exams on Admission: DG Chest [place] 1 View  Result Date: 08/26/2022 CLINICAL DATA:  89 year old female with history of hematemesis and generalized weakness. EXAM: PORTABLE CHEST 1 VIEW COMPARISON:  Chest x-ray 06/07/2022. FINDINGS: Lung volumes are low. Bibasilar opacities favored to reflect areas of subsegmental atelectasis. Diffuse interstitial prominence and widespread peribronchial cuffing. No definite pleural effusions. No pneumothorax. No evidence of pulmonary edema. Heart size is normal. Upper mediastinal contours are within normal limits allowing for patient positioning. Atherosclerotic calcifications in the thoracic aorta. IMPRESSION: 1. The appearance of the chest is concerning for probable  acute bronchitis, as above. Some degree of underlying interstitial lung disease is also suspected. 2. Aortic atherosclerosis. Electronically Signed   By: Trudie Reed M.D.   On: 08/26/2022 05:51      Assessment/Plan Principal Problem:   Hematemesis Active Problems:   Acute blood loss anemia   Essential hypertension   Hypertensive urgency   Chronic diastolic CHF (congestive heart failure)   NSTEMI (non-ST elevated myocardial infarction)   Chest pain   Hypokalemia   Hypocalcemia   Hypomagnesemia   Hypophosphatemia   Type II diabetes mellitus with renal manifestations   Anemia of chronic renal failure, stage 3b   Gout   Depression   Rash   Assessment and Plan:   Hematemesis and acute blood loss anemia: Hgb 12.4 on 08/22/22 --> 10.8. Hemodynamically stable.  Etiology is not clear.  Consulted Dr. Norma Fredrickson of GI. Per Dr. Norma Fredrickson, differential diagnosis is broad, Including "Mallory-Weiss tear, peptic ulcer disease, gastritis, mucosal oropharyngeal trauma, duodenitis, esophagitis, AVMs, neoplasm, polyp, etc". Dr. Norma Fredrickson discussed with patient about EGD versus conservative treatment. Pt prefers conservative approach and declines EGD which is reasonable given her old age and medical comorbidities.  - will admitted to PCU as inpatient - start clear diet n ow - IVF: 1L LR in ED - Start IV pantoprazole 40 mg bid - Zofran IV for nausea - Avoid NSAIDs and SQ heparin - Maintain IV access (2 large bore IVs if possible). - Monitor closely and follow q6h cbc, transfuse as necessary, if Hgb<7.0 - LaB: INR, PTT and type screen - hold ASA and plavix  Essential hypertension and hypertensive urgency: Initial blood pressure 204/78, improved to 164/68 after treated with metoprolol in ED. -IV hydralazine as needed -Patient is on Lasix, oral hydralazine, metoprolol  Chronic diastolic CHF (congestive heart failure): 2D echo on 06/29/2022 showed EF> 55% with grade 1 diastolic dysfunction.  BNP is  elevated 621, but patient patient does not have leg edema or JVD.  Does not seem to have  CHF exacerbation. -Continue home Lasix 40 mg daily  NSTEMI and chest pain: trop 88 --> 856.  Since patient has hematemesis, will hold aspirin and Plavix.  Patient also is not a candidate for using IV heparin. Her D-dimer is possible. Will need to r/o PE and DVT. Consulted Dr. Darrold Junker of cardiology for elevated troponin and possible A-fib.  Cardiologist reviewed EKG, they did not think patient has A-fib.  -Switch Zocor to Lipitor 80 mg daily -Trend troponin -check FLP (pt had A1c 9.0 on 06/28/22, will not repeat A1c). -prn nitroglycerin and morphine. - F/u LE doppler to r/o DVT - f/u V/Q scan to r/o PE  Electrolyte disturbance (hypokalemia, Hypocalcemia, Hypomagnesemia, Hypophosphatemia):  potassium 2.5, magnesium 1.4, phosphorus 1.7, calcium 7.3 -repleted K, Mg, phophorus and calcium -f/u BMP, Mg and phosphorus levels  Type II diabetes mellitus with renal manifestations: Recent A1c 9.0.  Poorly controlled.  Patient is taking Jardiance at home -Will hold Jardiance due to suspicious allergic reaction (rash) -SSI  Anemia of chronic renal failure, stage 3b: Hgb 11.9 (12.4 on 08/22/22) -f/u by CBC  Gout -Allopurinol  Depression:  -Continue home medications  Rash: Etiology is not clear.  May be due to newly started Jardiance -Hold Jardiance  Addendum: Patient developed fever 103.7 in the late afternoon.  Etiology is not clear.  Chest x-ray showed a possible bronchitis. -Will start Rocephin and Azithromycin per pharmacist recommendation -blood culture, UA and urine culture -sputum culture      DVT ppx: SCD  Code Status: DNR Per pt and her daughter  Family Communication: Yes, patient's daughter by phone  Disposition Plan:  Anticipate discharge back to previous environment  Consults called:  Dr. Darrold Junker of cardiology and Dr. Norma Fredrickson of GI are consulted.   Admission status and Level of  care: Progressive:    as inpt       Dispo: The patient is from:  Independent living facility              Anticipated d/c is to:  Independent living facility              Anticipated d/c date is: 2 days              Patient currently is not medically stable to d/c.    Severity of Illness:  The appropriate patient status for this patient is INPATIENT. Inpatient status is judged to be reasonable and necessary in order to provide the required intensity of service to ensure the patient's safety. The patient's presenting symptoms, physical exam findings, and initial radiographic and laboratory data in the context of their chronic comorbidities is felt to place them at high risk for further clinical deterioration. Furthermore, it is not anticipated that the patient will be medically stable for discharge from the hospital within 2 midnights of admission.   * I certify that at the point of admission it is my clinical judgment that the patient will require inpatient hospital care spanning beyond 2 midnights from the point of admission due to high intensity of service, high risk for further deterioration and high frequency of surveillance required.*       Date of Service 08/26/2022    Lorretta Harp [facility]   If 7PM-7AM, please contact night-coverage [email or web address] 08/26/2022, 6:01 PM

## 2022-08-26 NOTE — Consult Note (Signed)
[facility] CARDIOLOGY CONSULT NOTE       Patient ID: Tina COLLER MRN: [id number] [name]/AGE: 07-25-1926 89 y.o.  Admit date: 08/26/2022 Referring Physician Dr. Lorretta Harp Primary Physician Dr. Larwance Sachs  Primary Cardiologist Dr. Lady Gary (seen once in 2019) Reason for Consultation possible atrial fibrillation  HPI: Tina Curtis is a 89yoF with a PMH of CKD 3, DM2, HFpEF (EF >55%, G1 DD 06/2022), renal artery stenosis s/p stenting, history of breast cancer who presented to [facility] ED 08/26/2022 from [facility] with reported vomiting and coffee-ground emesis every couple weeks right upper abdominal pain. Cardiology is consulted for possible new onset atrial fibrillation.   The patient was recently seen at [facility] ED on 4/13 for 4-5 episodes of nonbloody, nonbilious vomiting and abdominal pain. This was attributed to viral gastroenteritis and she was discharged home without admission.   She presents today with vomiting again, reportedly coffee-ground emesis and RUQ abdominal pain. Poor PO intake for about a month per the patient and her close friend/neighbor at bedside. Some episodes of loose stools, but none since being in the emergency department.  She has ongoing pleuritic chest discomfort worsened with coughing and taking deep breaths which is unusual for her.  She denies shortness of breath, heart racing or palpitations, or peripheral edema.  She was recently started on Jardiance by her nephrologist on 4/10.  Recent vitals are notable for blood pressure of 129/49, heart rate in the 70s and 80s in sinus rhythm with occasional premature atrial contractions on telemetry.  She is saturating at 90% on 2 L by nasal cannula.  Labs notable for significant hypokalemia with potassium 2.5, hypomagnesemia with serum mag 1.4, BUN/creatinine 21/1.34 GFR 37.  BNP borderline elevated at 621, high-sensitivity troponin initial check at 88.  H&H with slight downtrend from 11.9/35.1-10.8/32.0.  EKG today was read by the  computer as atrial fibrillation with rate of 86 bpm, but upon my review and review with Dr. Darrold Junker, the rhythm is actually normal sinus rhythm with nonspecific T wave changes that are similar to prior study from April 13.  There is no evidence on telemetry thus far of atrial fibrillation either.  Review of systems complete and found to be negative unless listed above     Past Medical History:  Diagnosis Date   Arthritis    Gout   Chronic kidney disease    Diarrhea    Hypercholesteremia    Hypertension    Neuropathy    feet and lower legs   Seasonal allergies    Skin cancer    face    Past Surgical History:  Procedure Laterality Date   ABDOMINAL HYSTERECTOMY  2000   APPENDECTOMY     BREAST BIOPSY Right 09/19/2019   affirm bx of calcs UOQ, x marker, path pending   BREAST BIOPSY Right 09/19/2019   [place] bx of mass,heart marker, path pending   BREAST BIOPSY Right 09/19/2019   [place] bx of LN, coil marker, path pending   CATARACT EXTRACTION Left    CATARACT EXTRACTION W/PHACO Right 10/24/2014   Procedure: CATARACT EXTRACTION PHACO AND INTRAOCULAR LENS PLACEMENT (IOC);  Surgeon: Lockie Mola, MD;  Location: [facility];  Service: Ophthalmology;  Laterality: Right;   ESOPHAGOGASTRODUODENOSCOPY N/A 03/07/2018   Procedure: ESOPHAGOGASTRODUODENOSCOPY (EGD);  Surgeon: Toney Reil, MD;  Location: [facility] ENDOSCOPY;  Service: Gastroenterology;  Laterality: N/A;   MASTECTOMY MODIFIED RADICAL Right 10/13/2019   Procedure: MASTECTOMY MODIFIED RADICAL;  Surgeon: Earline Mayotte, MD;  Location: [facility] ORS;  Service: General;  Laterality: Right;   RENAL ANGIOGRAPHY Right 04/11/2018   Procedure: RENAL ANGIOGRAPHY;  Surgeon: Annice Needy, MD;  Location: [facility] INVASIVE CV LAB;  Service: Cardiovascular;  Laterality: Right;   SIMPLE MASTECTOMY WITH AXILLARY SENTINEL NODE BIOPSY Left 10/13/2019   Procedure: SIMPLE MASTECTOMY TRUE CUT BIOPSY, SENTINEL NODE BIOPSY;  Surgeon: Earline Mayotte, MD;  Location: [facility] ORS;  Service: General;  Laterality: Left;    (Not in a hospital admission)  Social History   Socioeconomic History   Marital status: Widowed    Spouse name: Not on file   Number of children: Not on file   Years of education: Not on file   Highest education level: Not on file  Occupational History   Not on file  Tobacco Use   Smoking status: Never   Smokeless tobacco: Never  Substance and Sexual Activity   Alcohol use: No   Drug use: Never   Sexual activity: Not on file  Other Topics Concern   Not on file  Social History Narrative   2 daughters; lives in [address]; Runner, broadcasting/film/video retd. 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    Social Determinants of Health   Financial Resource Strain: Not on file  Food Insecurity: Not on file  Transportation Needs: Patient Declined (06/06/2022)   PRAPARE - Administrator, Civil Service (Medical): Patient declined    Lack of Transportation (Non-Medical): Patient declined  Physical Activity: Not on file  Stress: Not on file  Social Connections: Not on file  Intimate Partner Violence: Patient Declined (06/06/2022)   Humiliation, Afraid, Rape, and Kick questionnaire    Fear of Current or Ex-Partner: Patient declined    Emotionally Abused: Patient declined    Physically Abused: Patient declined    Sexually Abused: Patient declined    Family History  Problem Relation Age of Onset   Hypertension Mother    Hypertension Father    Stroke Father    Breast cancer Daughter 28      Intake/Output Summary (Last 24 hours) at 08/26/2022 1130 Last data filed at 08/26/2022 1021 Gross per 24 hour  Intake 1400.13 ml  Output --  Net 1400.13 ml    Vitals:   08/26/22 0800 08/26/22 0815 08/26/22 0830 08/26/22 0917  BP: (!) 142/63 (!) 144/76 134/65   Pulse: 83 84 89   Resp: (!) Temp:    98.6 F (37 C)  TempSrc:    Oral  SpO2: 91% 90% (!) 89%   Weight:      Height:        PHYSICAL  EXAM General: elderly ill appearing caucasian female, sitting upright in ED Stretcher with close friend at bedside HEENT:  Normocephalic and atraumatic. Neck:  No JVD.  Chest: large demarcated area of erythema extending from right axilla and shoulder across chest, non tender, warm to touch  Lungs: Normal respiratory effort on 2L by [place]. Decreased breath sounds with crackles right base Heart: HRRR . Normal S1 and S2 without gallops or murmurs.  Abdomen: Non-distended appearing.  Msk: Normal strength and tone for age. Extremities: Warm and well perfused. No clubbing, cyanosis. No peripheral edema.  Neuro: Alert and oriented X 3. Psych:  Answers questions appropriately.   Labs: Basic Metabolic Panel: Recent Labs    08/26/22 0526  NA 139  K 2.5*  CL 106  CO2 21*  GLUCOSE 182*  BUN 21  CREATININE 1.34*  CALCIUM 7.3*  MG 1.4*  PHOS  1.7*   Liver Function Tests: Recent Labs    08/26/22 0526  AST 56*  ALT 35  ALKPHOS 72  BILITOT 1.1  PROT 7.4  ALBUMIN 3.9   Recent Labs    08/26/22 0526  LIPASE 26   CBC: Recent Labs    08/26/22 0526 08/26/22 0754  WBC 6.4 8.3  NEUTROABS 5.3  --   HGB 11.9* 10.8*  HCT 35.1* 32.0*  MCV 92.1 92.5  PLT 176 170   Cardiac Enzymes: Recent Labs    08/26/22 0526  TROPONINIHS 88*   BNP: Recent Labs    08/26/22 0526  BNP 621.0*   D-Dimer: No results for input(s): "DDIMER" in the last 72 hours. Hemoglobin A1C: No results for input(s): "HGBA1C" in the last 72 hours. Fasting Lipid Panel: No results for input(s): "CHOL", "HDL", "LDLCALC", "TRIG", "CHOLHDL", "LDLDIRECT" in the last 72 hours. Thyroid Function Tests: No results for input(s): "TSH", "T4TOTAL", "T3FREE", "THYROIDAB" in the last 72 hours.  Invalid input(s): "FREET3" Anemia Panel: No results for input(s): "VITAMINB12", "FOLATE", "FERRITIN", "TIBC", "IRON", "RETICCTPCT" in the last 72 hours.   Radiology: [facility] Chest Port 1 View  Result Date: 08/26/2022 CLINICAL DATA:   89 year old female with history of hematemesis and generalized weakness. EXAM: PORTABLE CHEST 1 VIEW COMPARISON:  Chest x-ray 06/07/2022. FINDINGS: Lung volumes are low. Bibasilar opacities favored to reflect areas of subsegmental atelectasis. Diffuse interstitial prominence and widespread peribronchial cuffing. No definite pleural effusions. No pneumothorax. No evidence of pulmonary edema. Heart size is normal. Upper mediastinal contours are within normal limits allowing for patient positioning. Atherosclerotic calcifications in the thoracic aorta. IMPRESSION: 1. The appearance of the chest is concerning for probable acute bronchitis, as above. Some degree of underlying interstitial lung disease is also suspected. 2. Aortic atherosclerosis. Electronically Signed   By: Trudie Reed M.D.   On: 08/26/2022 05:51   CT Angio Chest/Abd/Pel for Dissection W and/or Wo Contrast  Result Date: 08/22/2022 CLINICAL DATA:  Vomiting and diarrhea. Concern for acute aortic syndrome. History of breast cancer. EXAM: CT ANGIOGRAPHY CHEST, ABDOMEN AND PELVIS TECHNIQUE: Non-contrast CT of the chest was initially obtained. Multidetector CT imaging through the chest, abdomen and pelvis was performed using the standard protocol during bolus administration of intravenous contrast. Multiplanar reconstructed images and MIPs were obtained and reviewed to evaluate the vascular anatomy. RADIATION DOSE REDUCTION: This exam was performed according to the departmental dose-optimization program which includes automated exposure control, adjustment of the mA and/or kV according to patient size and/or use of iterative reconstruction technique. CONTRAST:  80mL OMNIPAQUE IOHEXOL 350 MG/ML SOLN COMPARISON:  CT chest dated June 08, 2022. CT abdomen pelvis dated March 18, 2022. FINDINGS: CTA CHEST FINDINGS Cardiovascular: Preferential opacification of the thoracic aorta. No evidence of thoracic aortic aneurysm or dissection. No intramural  hematoma. Coronary, aortic arch, and branch vessel atherosclerotic vascular disease. Normal heart size. No pericardial effusion. No pulmonary embolism. Mediastinum/Nodes: No enlarged mediastinal, hilar, or axillary lymph nodes. Thyroid gland, trachea, and esophagus demonstrate no significant findings. Lungs/Pleura: Bibasilar scarring. No focal consolidation, pleural effusion, or pneumothorax. Musculoskeletal: Status post bilateral mastectomies. No acute or significant osseous findings. Review of the MIP images confirms the above findings. CTA ABDOMEN AND PELVIS FINDINGS VASCULAR Aorta: Unchanged infrarenal abdominal aortic aneurysm measuring up to 3.6 cm in diameter. No dissection, vasculitis or significant stenosis. Calcified and noncalcified atherosclerotic plaque. 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 Appearance is suggestive of median  arcuate ligament compression. 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 Renals: Single renal arteries bilaterally. Chronic severe stenosis of the distal left renal artery with severe left renal atrophy. Patent right proximal renal artery stent. IMA: Patent without evidence of aneurysm, dissection, vasculitis or significant stenosis. Inflow: Patent without evidence of aneurysm, dissection, vasculitis or significant stenosis. Veins: No obvious venous abnormality within the limitations of this arterial phase study. Review of the MIP images confirms the above findings. NON-VASCULAR Hepatobiliary: No focal liver abnormality is seen. No gallstones, gallbladder wall thickening, or biliary dilatation. Pancreas: No ductal dilatation or surrounding inflammatory changes. Multiple cystic pancreatic lesions measuring up to 2.6 cm, previously 2.5 cm. Spleen: Normal in size without focal abnormality. Adrenals/Urinary Tract: Adrenal glands are unremarkable. Chronic severe left renal atrophy. No renal calculi or  hydronephrosis. Bladder is unremarkable. Stomach/Bowel: Unchanged small hiatal hernia. No bowel wall thickening, distention, or surrounding inflammatory changes. History of prior appendectomy. Lymphatic: No enlarged abdominal or pelvic lymph nodes. Reproductive: Status post hysterectomy. No adnexal masses. Other: No free fluid or pneumoperitoneum. Musculoskeletal: No acute or significant osseous findings. Review of the MIP images confirms the above findings. IMPRESSION: 1. No evidence of acute aortic syndrome. 2. Unchanged infrarenal abdominal aortic aneurysm measuring up to 3.6 cm in diameter. Recommend follow-up ultrasound every 2 years. 3. 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 No evidence of bowel ischemia. 4. Multiple cystic pancreatic lesions measuring up to 2.6 cm. Given the patient's age, no routine follow-up is recommended. 5.  Aortic atherosclerosis (ICD10-I70.0). Electronically Signed   By: Obie Dredge M.D.   On: 08/22/2022 13:34   CT HEAD WO CONTRAST ( )  Result Date: 08/22/2022 CLINICAL DATA:  Acute onset of severe headache. EXAM: CT HEAD WITHOUT CONTRAST TECHNIQUE: Contiguous axial images were obtained from the base of the skull through the vertex without intravenous contrast. RADIATION DOSE REDUCTION: This exam was performed according to the departmental dose-optimization program which includes automated exposure control, adjustment of the mA and/or kV according to patient size and/or use of iterative reconstruction technique. COMPARISON:  06/05/2022 FINDINGS: Brain: No evidence of intracranial hemorrhage, acute infarction, hydrocephalus, extra-axial collection, or mass lesion/mass effect. Mild cerebral atrophy and chronic small vessel disease again noted. Vascular:  No hyperdense vessel or other acute findings. Skull: No evidence of fracture or other significant bone abnormality. Sinuses/Orbits:  No acute findings. Other: None. IMPRESSION: No acute intracranial  abnormality. Stable mild cerebral atrophy and chronic small vessel disease. Electronically Signed   By: Danae Orleans M.D.   On: 08/22/2022 13:22   DG Bone Density  Result Date: 08/20/2022 EXAM: DUAL X-RAY ABSORPTIOMETRY (DXA) FOR BONE MINERAL DENSITY IMPRESSION: Your patient Aya Geisel completed a BMD test on 08/20/2022 using the [employer] DXA System (software version: 14.10) manufactured by [employer]. The following summarizes the results of our evaluation. Technologist: [facility] PATIENT BIOGRAPHICAL: Name: Satya, Bohall Patient ID: [id number] Birth Date: Dec 12, 1926 Height: 64.0 in. Gender: Female Exam Date: 08/20/2022 Weight: 135.1 lbs. Indications: History of Breast Cancer, Advanced Age, Hysterectomy, Caucasian, History of Radiation, Oophorectomy Bilateral, Postmenopausal Fractures: Treatments: Anastrozole, Calcium, Prolia, Vitamin D DENSITOMETRY RESULTS: Site         Region     Measured Date Measured Age WHO Classification Young Adult T-score BMD         %Change vs. Previous Significant Change (*) DualFemur Neck Right 08/20/2022 93.2 Osteoporosis -2.7 0.669 g/cm2 2.3% - DualFemur Neck Right 03/07/2020 90.8 Osteoporosis -2.8 0.654 g/cm2 - - DualFemur  Total Mean 08/20/2022 93.2 Osteoporosis -2.6 0.685 g/cm2 1.3% - DualFemur Total Mean 03/07/2020 90.8 Osteoporosis -2.6 0.676 g/cm2 - - Left Forearm Radius 33% 08/20/2022 93.2 Osteoporosis -3.1 0.604 g/cm2 10.2% Yes Left Forearm Radius 33% 03/07/2020 90.8 Osteoporosis -3.7 0.548 g/cm2 - - ASSESSMENT: The BMD measured at Forearm Radius 33% is 0.604 g/cm2 with a T-score of -3.1. This patient is considered osteoporotic according to World Health Organization [facility]) criteria. The scan quality is good. Lumbar spine was not utilized due to advanced degenerative changes. Compared with prior study, there has been no significant change in the total hip. World Science writer [facility]) criteria for post-menopausal, Caucasian Women: Normal:                   T-score  at or above -1 SD Osteopenia/low bone mass: T-score between -1 and -2.5 SD Osteoporosis:             T-score at or below -2.5 SD RECOMMENDATIONS: 1. All patients should optimize calcium and vitamin D intake. 2. Consider FDA-approved medical therapies in postmenopausal women and men aged 80 years and older, based on the following: a. A hip or vertebral(clinical or morphometric) fracture b. T-score < -2.5 at the femoral neck or spine after appropriate evaluation to exclude secondary causes c. Low bone mass (T-score between -1.0 and -2.5 at the femoral neck or spine) and a 10-year probability of a hip fracture > 3% or a 10-year probability of a major osteoporosis-related fracture > 20% based on the [place]-adapted WHO algorithm 3. Clinician judgment and/or patient preferences may indicate treatment for people with 10-year fracture probabilities above or below these levels FOLLOW-UP: People with diagnosed cases of osteoporosis or at high risk for fracture should have regular bone mineral density tests. For patients eligible for Medicare, routine testing is allowed once every 2 years. The testing frequency can be increased to one year for patients who have rapidly progressing disease, those who are receiving or discontinuing medical therapy to restore bone mass, or have additional risk factors. I have reviewed this report, and agree with the above findings. [facility] Electronically Signed   By: Gerome Sam III M.D.   On: 08/20/2022 17:57    ECHO 06/29/2022 DOPPLER ECHO and OTHER SPECIAL PROCEDURES                 Aortic: MODERATE AR                MILD AS                         173.0 cm/sec peak vel      12.0 mmHg peak grad                         5.6 mmHg mean grad         1.6 cm^2 by DOPPLER                 Mitral: No MR                      MILD MS                         MV Inflow E Vel = 71.4 cm/sec     MV Annulus E'Vel = 6.0 cm/sec  E/E'Ratio = 11.9               Tricuspid: MILD TR                    No TS                         248.0 cm/sec peak TR vel   27.6 mmHg peak RV pressure              Pulmonary: MILD PR                    No PS  _________________________________________________________________________________________  INTERPRETATION  NORMAL LEFT VENTRICULAR SYSTOLIC FUNCTION  NORMAL RIGHT VENTRICULAR SYSTOLIC FUNCTION  MODERATE VALVULAR REGURGITATION (See above)  MILD VALVULAR STENOSIS (See above)  _________________________________________________________________________________________  Electronically signed by      Danella Penton, MD on 06/30/2022 05: 31 PM           Performed By: Rayetta Humphrey, RCS     Ordering Physician: Ned Clines   TELEMETRY reviewed by me (LT) 08/26/2022 : NSR rate 70s -80s with occasional PACs  EKG reviewed by me: normal sinus rhythm with nonspecific T wave changes that are similar to prior study from April 13.  Data reviewed by me (LT) 08/26/2022: ed note, admission H&P, last 24h vitals tele labs imaging I/O    Principal Problem:   Hematemesis Active Problems:   Gout   Anemia of chronic renal failure, stage 3b   Depression   Essential hypertension   Myocardial injury   Hypertensive urgency   Hypokalemia   Hypocalcemia   Chronic diastolic CHF (congestive heart failure)   Acute blood loss anemia   Hypomagnesemia   Hypophosphatemia   Type II diabetes mellitus with renal manifestations    ASSESSMENT AND PLAN:  Dorita Fray is a 89yoF with a PMH of CKD 3, DM2, HFpEF (EF >55%, G1 DD 06/2022), renal artery stenosis s/p stenting, history of breast cancer who presented to [facility] ED 08/26/2022 from [facility] with reported vomiting and coffee-ground emesis every couple weeks right upper abdominal pain. Cardiology is consulted for possible new onset atrial fibrillation.   # hematemesis  # electrolyte disturbances # normal sinus rhythm  Presents to [facility] with profuse nausea and vomiting and diarrhea with  poor p.o. intake reportedly for the past month.  EKG read by the computer as atrial fibrillation, but this is incorrect.  Read by myself and Dr. Darrold Junker appears to be normal sinus rhythm with occasional premature atrial contractions noted on telemetry without evidence of atrial fibrillation, or other arrhythmias noted so far. -Agree with current therapy per primary team -Recommend monitoring and replenishing electrolytes for goal K >4, and mag >2 -recommending holding jardiance for now -Defer additional cardiac diagnostics at this time  # ? Drug rash Large demarcated area of erythema across entire chest, not itchy or tender per patient. Query if related to jardiance or maybe yeast?  - management per primary   Cardiology will sign off. Please haiku with questions or re-engage if needed.    This patient's plan of care was discussed and created with Dr. Darrold Junker and he is in agreement.  Signed: Rebeca Allegra , PA-C 08/26/2022, 11:30 AM [facility] Cardiology

## 2022-08-26 NOTE — ED Provider Notes (Signed)
[facility] Provider Note    Event Date/Time   First MD Initiated Contact with Patient 08/26/22 0502     (approximate)   History   Vomiting blood   HPI  Tina Curtis is a 89 y.o. female brought to the ED via EMS from home with a chief complaint of hematemesis.  Patient was seen in the ED several days ago, diagnosed with gastroenteritis.  Reports vomiting coffee-ground emesis this morning with right upper abdominal pain.  Received IV Zofran by EMS prior to arrival.  Takes Plavix.  Denies fever/chills, chest pain, shortness of breath.     Past Medical History   Past Medical History:  Diagnosis Date   Arthritis    Gout   Chronic kidney disease    Diarrhea    Hypercholesteremia    Hypertension    Neuropathy    feet and lower legs   Seasonal allergies    Skin cancer    face     Active Problem List   Patient Active Problem List   Diagnosis Date Noted   Acute on chronic respiratory failure with hypoxia 06/10/2022   Emphysema lung 06/10/2022   CAP (community acquired pneumonia) 06/07/2022   Postural dizziness with presyncope 06/06/2022   History of breast cancer 06/06/2022   Depression 06/06/2022   Essential hypertension 06/06/2022   Orthostatic hypotension 06/06/2022   Dehydration 06/06/2022   Dizziness and giddiness 06/02/2022   Osteoporosis of forearm 03/28/2020   Breast cancer 10/13/2019   Carcinoma of upper-inner quadrant of right breast in female, estrogen receptor positive 09/27/2019   Anemia in chronic kidney disease 02/01/2019   Atherosclerosis of renal artery 02/01/2019   Benign hypertensive kidney disease with chronic kidney disease 02/01/2019   Hyposmolality and/or hyponatremia 02/01/2019   Proteinuria 02/01/2019   Secondary hyperparathyroidism of renal origin 02/01/2019   CKD (chronic kidney disease) stage 4, GFR 15-29 ml/min 04/01/2018   Renovascular hypertension 04/01/2018   Renal artery stenosis 04/01/2018   Nausea &  vomiting 03/05/2018   AKI (acute kidney injury) 02/17/2018   Acquired hammer toe of right foot 08/14/2014   Bunion of right foot 08/14/2014   Gout 08/14/2014   History of Clostridium difficile colitis 08/14/2014   Pure hypercholesterolemia 08/14/2014   RLS (restless legs syndrome) 08/14/2014   Hallux rigidus of right foot 04/24/2014   Primary osteoarthritis of foot 09/01/2013   Fatigue 09/30/2012   Gluteus medius or minimus syndrome 08/23/2012   Trochanteric bursitis 08/12/2012   Benign essential hypertension 09/03/2011   Diverticulosis of colon 09/03/2011   Osteopenia 09/03/2011   Enthesopathy of ankle and tarsus 04/13/2011   Osteoarthritis of spine with radiculopathy, lumbosacral region 09/30/2010   Allergic rhinitis 10/31/1928     Past Surgical History   Past Surgical History:  Procedure Laterality Date   ABDOMINAL HYSTERECTOMY  2000   APPENDECTOMY     BREAST BIOPSY Right 09/19/2019   affirm bx of calcs UOQ, x marker, path pending   BREAST BIOPSY Right 09/19/2019   [place] bx of mass,heart marker, path pending   BREAST BIOPSY Right 09/19/2019   [place] bx of LN, coil marker, path pending   CATARACT EXTRACTION Left    CATARACT EXTRACTION W/PHACO Right 10/24/2014   Procedure: CATARACT EXTRACTION PHACO AND INTRAOCULAR LENS PLACEMENT (IOC);  Surgeon: Lockie Mola, MD;  Location: [facility];  Service: Ophthalmology;  Laterality: Right;   ESOPHAGOGASTRODUODENOSCOPY N/A 03/07/2018   Procedure: ESOPHAGOGASTRODUODENOSCOPY (EGD);  Surgeon: Toney Reil, MD;  Location: [facility];  Service: Gastroenterology;  Laterality: N/A;   MASTECTOMY MODIFIED RADICAL Right 10/13/2019   Procedure: MASTECTOMY MODIFIED RADICAL;  Surgeon: Earline Mayotte, MD;  Location: [facility] ORS;  Service: General;  Laterality: Right;   RENAL ANGIOGRAPHY Right 04/11/2018   Procedure: RENAL ANGIOGRAPHY;  Surgeon: Annice Needy, MD;  Location: [facility] INVASIVE CV LAB;  Service: Cardiovascular;   Laterality: Right;   SIMPLE MASTECTOMY WITH AXILLARY SENTINEL NODE BIOPSY Left 10/13/2019   Procedure: SIMPLE MASTECTOMY TRUE CUT BIOPSY, SENTINEL NODE BIOPSY;  Surgeon: Earline Mayotte, MD;  Location: [facility] ORS;  Service: General;  Laterality: Left;     Home Medications   Prior to Admission medications   Medication Sig Start Date End Date Taking? Authorizing Provider  albuterol (VENTOLIN HFA) 108 (90 Base) MCG/ACT inhaler Inhale 2 puffs into the lungs every 6 (six) hours as needed for wheezing or shortness of breath. 06/10/22   Lurene Shadow, MD  allopurinol (ZYLOPRIM) 100 MG tablet Take 100 mg by mouth daily. 01/16/20   [provider]  anastrozole (ARIMIDEX) 1 MG tablet TAKE 1 TABLET(1 MG) BY MOUTH DAILY. DO NOT. START IF RASH IS NOT BETTER 06/04/22   Earna Coder, MD  aspirin 81 MG EC tablet Take 81 mg by mouth daily. 10/24/20   [provider]  citalopram (CELEXA) 10 MG tablet Take 10 mg by mouth daily.  03/14/18   [provider]  clopidogrel (PLAVIX) 75 MG tablet TAKE 1 TABLET BY MOUTH EVERY DAY 05/16/19   Annice Needy, MD  cyanocobalamin (VITAMIN B12) 1000 MCG tablet Take 1,000 mcg by mouth daily.    [provider]  docusate sodium (COLACE) 100 MG capsule Take 100 mg by mouth daily as needed for mild constipation.    [provider]  DULoxetine (CYMBALTA) 60 MG capsule Take 60 mg by mouth daily.  11/11/18   [provider]  Ergocalciferol 50 MCG (2000 UT) TABS Take 2,000 Units by mouth daily. 01/09/15   [provider]  furosemide (LASIX) 40 MG tablet Take 1 tablet (40 mg total) by mouth daily. 06/10/22   Lurene Shadow, MD  gabapentin (NEURONTIN) 300 MG capsule Take 300 mg by mouth at bedtime.    [provider]  hydrALAZINE (APRESOLINE) 25 MG tablet Take 1 tablet by mouth 3 (three) times daily. 04/09/22 04/29/23  [provider]  loratadine (CLARITIN) 10 MG tablet Take 10 mg by mouth daily as needed for  allergies. AM    [provider]  metoprolol succinate (TOPROL-XL) 25 MG 24 hr tablet Take 37.5 mg by mouth at bedtime.     [provider]  ondansetron (ZOFRAN-ODT) 4 MG disintegrating tablet Take 1 tablet (4 mg total) by mouth every 8 (eight) hours as needed for up to 5 days. 08/22/22 08/27/22  Concha Se, MD  simvastatin (ZOCOR) 20 MG tablet Take 20 mg by mouth every evening. PM    [provider]     Allergies  Penicillins and Drug ingredient [zinc]   Family History   Family History  Problem Relation Age of Onset   Hypertension Mother    Hypertension Father    Stroke Father    Breast cancer Daughter 19     Physical Exam  Triage Vital Signs: ED Triage Vitals  Enc Vitals Group     BP      Pulse      Resp      Temp      Temp src  SpO2      Weight      Height      Head Circumference      Peak Flow      Pain Score      Pain Loc      Pain Edu?      Excl. in GC?     Updated Vital Signs: BP (!) 186/76   Pulse 82   Temp (!) 97.1 F (36.2 C) (Oral)   Resp 20   Ht 5\' 4"  (1.626 m)   Wt 60 kg   SpO2 93%   BMI 22.71 kg/m    General: Awake, moderate distress.  CV:  Normal rate, irregular rhythm.  Good peripheral perfusion.  Resp:  Normal effort.  Diminished, otherwise CTAB. Abd:  Mild epigastric tenderness to palpation without rebound or guarding.  No distention.  Other:  No truncal vesicles.  Coffee-ground emesis in oropharynx and on sheets which is guaiac positive.   ED Results / Procedures / Treatments  Labs (all labs ordered are listed, but only abnormal results are displayed) Labs Reviewed  CBC WITH DIFFERENTIAL/PLATELET - Abnormal; Notable for the following components:      Result Value   RBC 3.81 (*)    Hemoglobin 11.9 (*)    HCT 35.1 (*)    All other components within normal limits  COMPREHENSIVE METABOLIC PANEL - Abnormal; Notable for the following components:   Potassium 2.5 (*)    CO2 21 (*)    Glucose, Bld 182  (*)    Creatinine, Ser 1.34 (*)    Calcium 7.3 (*)    AST 56 (*)    GFR, Estimated 37 (*)    All other components within normal limits  PROTIME-INR - Abnormal; Notable for the following components:   Prothrombin Time 18.1 (*)    INR 1.5 (*)    All other components within normal limits  TROPONIN I (HIGH SENSITIVITY) - Abnormal; Notable for the following components:   Troponin I (High Sensitivity) 88 (*)    All other components within normal limits  LIPASE, BLOOD  TYPE AND SCREEN     EKG  ED ECG REPORT I, Charlett Merkle J, the attending physician, personally viewed and interpreted this ECG.   Date: 08/26/2022  EKG Time: 0512  Rate: 86  Rhythm: atrial fibrillation, rate 86  Axis: Normal  Intervals:none  ST&T Change: Nonspecific    RADIOLOGY I have independently visualized and interpreted patient's x-ray as well as noted the radiology interpretation:  Chest x-ray: Probable acute bronchitis, likely interstitial lung disease  Official radiology report(s): DG Chest Port 1 View  Result Date: 08/26/2022 CLINICAL DATA:  89 year old female with history of hematemesis and generalized weakness. EXAM: PORTABLE CHEST 1 VIEW COMPARISON:  Chest x-ray 06/07/2022. FINDINGS: Lung volumes are low. Bibasilar opacities favored to reflect areas of subsegmental atelectasis. Diffuse interstitial prominence and widespread peribronchial cuffing. No definite pleural effusions. No pneumothorax. No evidence of pulmonary edema. Heart size is normal. Upper mediastinal contours are within normal limits allowing for patient positioning. Atherosclerotic calcifications in the thoracic aorta. IMPRESSION: 1. The appearance of the chest is concerning for probable acute bronchitis, as above. Some degree of underlying interstitial lung disease is also suspected. 2. Aortic atherosclerosis. Electronically Signed   By: Trudie Reed M.D.   On: 08/26/2022 05:51     PROCEDURES:  Critical Care performed: Yes, see  critical care procedure note(s)  CRITICAL CARE Performed by: Irean Hong   Total critical care time: 45 minutes  Critical  care time was exclusive of separately billable procedures and treating other patients.  Critical care was necessary to treat or prevent imminent or life-threatening deterioration.  Critical care was time spent personally by me on the following activities: development of treatment plan with patient and/or surrogate as well as nursing, discussions with consultants, evaluation of patient's response to treatment, examination of patient, obtaining history from patient or surrogate, ordering and performing treatments and interventions, ordering and review of laboratory studies, ordering and review of radiographic studies, pulse oximetry and re-evaluation of patient's condition.  Marland Kitchen1-3 Lead EKG Interpretation  Performed by: Irean Hong, MD Authorized by: Irean Hong, MD     Interpretation: abnormal     ECG rate:  86   ECG rate assessment: normal     Rhythm: atrial fibrillation     Ectopy: none     Conduction: normal   Comments:     Patient placed on cardiac monitor to evaluate for arrhythmias    MEDICATIONS ORDERED IN ED: Medications  pantoprozole (PROTONIX) 80 mg /NS 100 mL infusion (8 mg/hr Intravenous New Bag/Given 08/26/22 0607)  potassium chloride 10 mEq in 100 mL IVPB (has no administration in time range)  potassium chloride 10 mEq in 100 mL IVPB (has no administration in time range)  potassium chloride 10 mEq in 100 mL IVPB (has no administration in time range)  potassium chloride 10 mEq in 100 mL IVPB (10 mEq Intravenous New Bag/Given 08/26/22 0624)  sodium chloride 0.9 % bolus 1,000 mL (1,000 mLs Intravenous New Bag/Given 08/26/22 0541)  pantoprazole (PROTONIX) 80 mg /NS 100 mL IVPB (0 mg Intravenous Stopped 08/26/22 0606)  metoprolol tartrate (LOPRESSOR) injection 2.5 mg (2.5 mg Intravenous Given 08/26/22 0555)     IMPRESSION / MDM / ASSESSMENT AND PLAN /  ED COURSE  I reviewed the triage vital signs and the nursing notes.                             89 year old female presenting with hematemesis. Differential diagnosis includes, but is not limited to, biliary disease (biliary colic, acute cholecystitis, cholangitis, choledocholithiasis, etc), intrathoracic causes for epigastric abdominal pain including ACS, gastritis, duodenitis, pancreatitis, small bowel or large bowel obstruction, abdominal aortic aneurysm, hernia, and ulcer(s).  Personally reviewed patient's records and note her ED visit from 08/22/2022 including CT scans.  Patient's presentation is most consistent with acute presentation with potential threat to life or bodily function.  The patient is on the cardiac monitor to evaluate for evidence of arrhythmia and/or significant heart rate changes.  Will obtain lab work, PT/INR, type and screen.  Initiate IV fluid resuscitation, Protonix bolus with drip.  Anticipate hospitalization.  Clinical Course as of 08/26/22 1610  Wed Aug 26, 2022  0517 I do not see a prior record of patient having atrial fibrillation.  EKG compared with her recent visit several days ago is new onset A-fib. [JS]  0552 Blood pressure remains elevated.  Looks like patient takes metoprolol; will give low-dose Lopressor.  Hemoglobin 11.9. [JS]  0600 CXR demonstrates bronchitis.  Room air saturation 89%, placed on 2 L nasal cannula oxygen. [JS]  [id number] Laboratory results demonstrate AKI with creatinine 1.34, elevated from recent ED visit.  Potassium is 2.5, will replete via IV.  Troponin elevated, likely secondary to demand ischemia.  Blood pressure 186/76 after recent administration of Lopressor.  At this time will consult hospital services for evaluation and admission. [JS]    Clinical  Course User Index [JS] Irean Hong, MD     FINAL CLINICAL IMPRESSION(S) / ED DIAGNOSES   Final diagnoses:  Hematemesis with nausea  New onset atrial fibrillation  Hypertension,  unspecified type  Hypoxia  AKI (acute kidney injury)  Hypokalemia  Bronchitis     Rx / DC Orders   ED Discharge Orders     None        Note:  This document was prepared using Dragon voice recognition software and may include unintentional dictation errors.   Irean Hong, MD 08/26/22 [phone]

## 2022-08-26 NOTE — ED Notes (Signed)
Magnesium sulfate & calcium gluconate compatable via Micromedex

## 2022-08-26 NOTE — ED Triage Notes (Signed)
Patient BIB [facility] c/o n/v/d x 2 weeks.  Patient reports generalized weakness and coffee ground emesis this morning.  Patient did have coffee ground emesis on her night gown.

## 2022-08-26 NOTE — Consult Note (Signed)
GI Inpatient Consult Note  Reason for Consult: Coffee-ground emesis/RUQ abdominal pain    Attending Requesting Consult: Dr. Lorretta Harp  History of Present Illness: Tina Curtis is a 89 y.o. female seen for evaluation of coffee-ground emesis and RUQ abdominal pain at the request of admitting hospitalist - Dr. Lorretta Harp. Patient has a PMH of T2DM, HFpEF, HTN, HLD, seasonal allergies, depression, CKD Stage IV, AOCD, renal artery stenosis s/p stenting, and hx of breast cancer. She presented to the [facility] ED early this morning for chief complaint of coffee-ground emesis and RUQ abd pain.  She has been struggling with intermittent nausea, vomiting, and diarrhea presumed 2/2 gastroenteritis over the past 2 weeks. She was seen at the [facility] ED 4/13 four days ago and diagnosed with viral gastroenteritis. 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 She saw her PCP in follow-up two days ago and was encouraged to continue supportive care. She reports her nephrologist started Jardiance medication few weeks ago and it has been held this week. She reports she did take an antibiotic approximately 2 weeks ago for sinus infection but cannot remember the name. Per notes in chart, she had episode of coffee-ground emesis this morning and EMS was called. She reports she has been struggling with poor PO intake for about a month. She reports bilateral upper abdominal pressure described as pulling sensation. She has been having a lot of dry heaving and intermittent vomiting over the past two weeks. She adamantly denies any coffee-ground emesis at home. She has not had any episodes of vomiting in the ED. However, ED physician's notes did mention coffee-ground emesis in oropharynx and on sheets which is guaiac positive. Labs significant for hemoglobin 11.9 and MCV 92 which is at baseline. She noticed for the  first time today a rash on her right upper arm and torso. It is itchy. She complains of pleuritic chest pain, dry cough without shortness of breath, and RUQ abdominal discomfort. Previous EGD 02/2018 performed by Dr. Kandis Ban showed normal GEJ and esophagus, one non-bleeding gastric ulcer with clean ulcer base(Forrest Class III), gastritis, and normal duodenum.   Past Medical History:  Past Medical History:  Diagnosis Date   Arthritis    Gout   Chronic kidney disease    Diarrhea    Hypercholesteremia    Hypertension    Neuropathy    feet and lower legs   Seasonal allergies    Skin cancer    face    Problem List: Patient Active Problem List   Diagnosis Date Noted   Hematemesis 08/26/2022   Myocardial injury 08/26/2022   Hypertensive urgency 08/26/2022   Hypokalemia 08/26/2022   Hypocalcemia 08/26/2022   Chronic diastolic CHF (congestive heart failure) 08/26/2022   Acute blood loss anemia 08/26/2022   Hypomagnesemia 08/26/2022   Hypophosphatemia 08/26/2022   Type II diabetes mellitus with renal manifestations 08/26/2022   Rash 08/26/2022   Acute on chronic respiratory failure with hypoxia 06/10/2022   Emphysema lung 06/10/2022   CAP (community acquired pneumonia) 06/07/2022   Postural dizziness with presyncope 06/06/2022   History of breast cancer 06/06/2022   Depression 06/06/2022   Essential hypertension 06/06/2022   Orthostatic hypotension 06/06/2022   Dehydration 06/06/2022   Dizziness and giddiness 06/02/2022   Osteoporosis of forearm 03/28/2020   Breast cancer 10/13/2019   Carcinoma of upper-inner quadrant of right breast in female, estrogen receptor positive  09/27/2019   Anemia in chronic kidney disease 02/01/2019   Atherosclerosis of renal artery 02/01/2019   Benign hypertensive kidney disease with chronic kidney disease 02/01/2019   Hyposmolality and/or hyponatremia 02/01/2019   Proteinuria 02/01/2019   Secondary hyperparathyroidism of renal origin 02/01/2019    Anemia of chronic renal failure, stage 3b 04/01/2018   Renovascular hypertension 04/01/2018   Renal artery stenosis 04/01/2018   Nausea & vomiting 03/05/2018   AKI (acute kidney injury) 02/17/2018   Acquired hammer toe of right foot 08/14/2014   Bunion of right foot 08/14/2014   Gout 08/14/2014   History of Clostridium difficile colitis 08/14/2014   Pure hypercholesterolemia 08/14/2014   RLS (restless legs syndrome) 08/14/2014   Hallux rigidus of right foot 04/24/2014   Primary osteoarthritis of foot 09/01/2013   Fatigue 09/30/2012   Gluteus medius or minimus syndrome 08/23/2012   Trochanteric bursitis 08/12/2012   Benign essential hypertension 09/03/2011   Diverticulosis of colon 09/03/2011   Osteopenia 09/03/2011   Enthesopathy of ankle and tarsus 04/13/2011   Osteoarthritis of spine with radiculopathy, lumbosacral region 09/30/2010   Allergic rhinitis 10/31/1928    Past Surgical History: Past Surgical History:  Procedure Laterality Date   ABDOMINAL HYSTERECTOMY  2000   APPENDECTOMY     BREAST BIOPSY Right 09/19/2019   affirm bx of calcs UOQ, x marker, path pending   BREAST BIOPSY Right 09/19/2019   [place] bx of mass,heart marker, path pending   BREAST BIOPSY Right 09/19/2019   [place] bx of LN, coil marker, path pending   CATARACT EXTRACTION Left    CATARACT EXTRACTION W/PHACO Right 10/24/2014   Procedure: CATARACT EXTRACTION PHACO AND INTRAOCULAR LENS PLACEMENT (IOC);  Surgeon: Lockie Mola, MD;  Location: [facility];  Service: Ophthalmology;  Laterality: Right;   ESOPHAGOGASTRODUODENOSCOPY N/A 03/07/2018   Procedure: ESOPHAGOGASTRODUODENOSCOPY (EGD);  Surgeon: Toney Reil, MD;  Location: [facility] ENDOSCOPY;  Service: Gastroenterology;  Laterality: N/A;   MASTECTOMY MODIFIED RADICAL Right 10/13/2019   Procedure: MASTECTOMY MODIFIED RADICAL;  Surgeon: Earline Mayotte, MD;  Location: [facility] ORS;  Service: General;  Laterality: Right;   RENAL ANGIOGRAPHY Right  04/11/2018   Procedure: RENAL ANGIOGRAPHY;  Surgeon: Annice Needy, MD;  Location: [facility] INVASIVE CV LAB;  Service: Cardiovascular;  Laterality: Right;   SIMPLE MASTECTOMY WITH AXILLARY SENTINEL NODE BIOPSY Left 10/13/2019   Procedure: SIMPLE MASTECTOMY TRUE CUT BIOPSY, SENTINEL NODE BIOPSY;  Surgeon: Earline Mayotte, MD;  Location: [facility] ORS;  Service: General;  Laterality: Left;    Allergies: Allergies  Allergen Reactions   Penicillins Anaphylaxis   Drug Ingredient [Zinc]     Home Medications: (Not in a hospital admission)  Home medication reconciliation was completed with the patient.   Scheduled Inpatient Medications:    allopurinol  100 mg Oral Daily   anastrozole  1 mg Oral Daily   cholecalciferol  2,000 Units Oral Daily   citalopram  10 mg Oral Daily   cyanocobalamin  1,000 mcg Oral Daily   DULoxetine  60 mg Oral Daily   furosemide  40 mg Oral Daily   gabapentin  300 mg Oral QHS   hydrALAZINE  25 mg Oral TID   insulin aspart  0-5 Units Subcutaneous QHS   insulin aspart  0-9 Units Subcutaneous TID WC   metoprolol succinate  37.5 mg Oral QHS   simvastatin  20 mg Oral QPM    Continuous Inpatient Infusions:    pantoprazole 8 mg/hr (08/26/22 0607)   potassium PHOSPHATE IVPB (in mmol) 30 mmol (  08/26/22 1153)    PRN Inpatient Medications:  acetaminophen, albuterol, dextromethorphan-guaiFENesin, docusate sodium, hydrALAZINE, loratadine, ondansetron (ZOFRAN) IV  Family History: family history includes Breast cancer (age of onset: 32) in her daughter; Hypertension in her father and mother; Stroke in her father.  The patient's family history is negative for inflammatory bowel disorders, GI malignancy, or solid organ transplantation.  Social History:   reports that she has never smoked. She has never used smokeless tobacco. She reports that she does not drink alcohol and does not use drugs. The patient denies ETOH, tobacco, or drug use.   Review of Systems: Constitutional:  Weight is stable.  Eyes: No changes in vision. ENT: No oral lesions, sore throat.  GI: see HPI.  Heme/Lymph: No easy bruising.  CV: + chest pain.  GU: No hematuria.  Integumentary: + rash   Neuro: No headaches.  Psych: + anxiety Endocrine: No heat/cold intolerance.  Allergic/Immunologic: No urticaria.  Resp: + cough, - SOB.  Musculoskeletal: No joint swelling.    Physical Examination: BP (!) 129/49   Pulse 84   Temp 98.6 F (37 C) (Oral)   Resp 18   Ht  (1.626 m)   Wt 60 kg   SpO2 90%   BMI 22.71 kg/m  Gen: NAD, alert and oriented x 4 HEENT: PEERLA, EOMI, Neck: supple, no JVD or thyromegaly Chest: CTA bilaterally, no wheezes, crackles, or other adventitious sounds CV: RRR, no m/g/c/r Abd: soft, nondistended, +BS in all four quadrants; tender to deep palpation in RUQ and epigastrium, no HSM, guarding, ridigity, or rebound tenderness Ext: no edema, well perfused with 2+ pulses, Skin: large demarcated area of erythema/rash extending from right axilla and shoulder across chest, nontender Lymph: no LAD  Data: Lab Results  Component Value Date   WBC 8.3 08/26/2022   HGB 10.8 (L) 08/26/2022   HCT 32.0 (L) 08/26/2022   MCV 92.5 08/26/2022   PLT 170 08/26/2022   Recent Labs  Lab 08/22/22 1110 08/26/22 0526 08/26/22 0754  HGB 12.4 11.9* 10.8*   Lab Results  Component Value Date   NA 139 08/26/2022   K 2.5 (LL) 08/26/2022   CL 106 08/26/2022   CO2 21 (L) 08/26/2022   BUN 21 08/26/2022   CREATININE 1.34 (H) 08/26/2022   Lab Results  Component Value Date   ALT 35 08/26/2022   AST 56 (H) 08/26/2022   ALKPHOS 72 08/26/2022   BILITOT 1.1 08/26/2022   Recent Labs  Lab 08/26/22 0526  APTT 33  INR 1.5*    Assessment/Plan:  89 y/o Caucasian female with a PMH of T2DM, HFpEF, HTN, HLD, seasonal allergies, depression, CKD Stage III, renal artery stenosis s/p stenting, and hx of breast cancer presented to the [facility] ED via EMS this morning for chief complaint  of coffee-ground emesis and RUQ abdominal pain. GI consulted for further evaluation and management.   Coffee-ground emesis/Hematemesis - hemodynamically insignificant bleeding, H&H stable at hemoglobin 10.8. Ddx includes Mallory-Weiss tear, peptic ulcer disease, gastritis, mucosal oropharyngeal trauma, duodenitis, esophagitis, AVMs, neoplasm, polyp, etc  Nausea/vomiting/diarrhea presumed 2/2 gastroenteritis   Electrolyte derangement - 2/2 #2 and poor PO intake  Skin rash/erythema - unclear etiology, possible drug-induced  Hypertensive urgency  Anemia of chronic disease   T2DM  Hx of gastric ulcer - seen on EGD in 2019  Advanced age   DNR Status   Recommendations:  - H&H stable currently with no signs of active/overt gastrointestinal bleeding - Continue to monitor serial H&H. Transfuse for Hgb <7.0.  -  Continue PPI BID for gastric protection - Continue supportive care with IV fluid hydration, antiemetics, pain control prn - Check stool studies to r/o infectious etiologies  - Evaluate and treat skin rash per primary team - Discussed potential invasive evaluation with EGD versus more conservative approach with medical management and observation. Given her age and medical comorbidities, she prefers conservative approach and declines EGD which is reasonable.  - Continue to monitor for signs of GI bleeding. No signs of overt GI bleeding.  - Low residue diet. ADAT.  - GI following along with you for now  Thank you for the consult. Please call with questions or concerns.  Gilda Crease, PA-C [facility] [phone]

## 2022-08-27 ENCOUNTER — Inpatient Hospital Stay
Admit: 2022-08-27 | Discharge: 2022-08-27 | Disposition: A | Discharge status: Home / Self Care | Payer: Medicare PPO | Attending: Cardiology | Admitting: Cardiology

## 2022-08-27 ENCOUNTER — Encounter: Payer: Self-pay | Admitting: Internal Medicine

## 2022-08-27 DIAGNOSIS — J189 Pneumonia, unspecified organism: Secondary | ICD-10-CM | POA: Diagnosis not present

## 2022-08-27 DIAGNOSIS — R7881 Bacteremia: Secondary | ICD-10-CM

## 2022-08-27 DIAGNOSIS — I214 Non-ST elevation (NSTEMI) myocardial infarction: Secondary | ICD-10-CM | POA: Diagnosis not present

## 2022-08-27 DIAGNOSIS — A409 Streptococcal sepsis, unspecified: Secondary | ICD-10-CM

## 2022-08-27 DIAGNOSIS — B955 Unspecified streptococcus as the cause of diseases classified elsewhere: Secondary | ICD-10-CM

## 2022-08-27 DIAGNOSIS — R6520 Severe sepsis without septic shock: Secondary | ICD-10-CM

## 2022-08-27 DIAGNOSIS — A084 Viral intestinal infection, unspecified: Secondary | ICD-10-CM | POA: Diagnosis not present

## 2022-08-27 DIAGNOSIS — K529 Noninfective gastroenteritis and colitis, unspecified: Secondary | ICD-10-CM | POA: Diagnosis not present

## 2022-08-27 DIAGNOSIS — J9601 Acute respiratory failure with hypoxia: Secondary | ICD-10-CM

## 2022-08-27 DIAGNOSIS — I5031 Acute diastolic (congestive) heart failure: Secondary | ICD-10-CM

## 2022-08-27 LAB — HEPARIN LEVEL (UNFRACTIONATED): Heparin Unfractionated: 0.3 IU/mL (ref 0.30–0.70)

## 2022-08-27 LAB — BLOOD CULTURE ID PANEL (REFLEXED) - BCID2

## 2022-08-27 LAB — ECHOCARDIOGRAM COMPLETE
AR max vel: 2.03 cm2
AV Area VTI: 2.75 cm2
AV Area mean vel: 2.4 cm2
AV Mean grad: 4 mmHg
AV Peak grad: 10.1 mmHg
Ao pk vel: 1.59 m/s
Area-P 1/2: 4.65 cm2
Height: 64 in
MV VTI: 2.77 cm2
S' Lateral: 2.8 cm
Weight: 2116.42 oz

## 2022-08-27 LAB — LIPID PANEL
Cholesterol: 75 mg/dL (ref 0–200)
HDL: 22 mg/dL — ABNORMAL LOW (ref >40)
LDL Cholesterol: 34 mg/dL (ref 0–99)
Total CHOL/HDL Ratio: 3.4 RATIO
Triglycerides: 97 mg/dL (ref <150)
VLDL: 19 mg/dL (ref 0–40)

## 2022-08-27 LAB — BASIC METABOLIC PANEL
Anion gap: 9 (ref 5–15)
BUN: 22 mg/dL (ref 8–23)
CO2: 20 mmol/L — ABNORMAL LOW (ref 22–32)
Calcium: 6.1 mg/dL — CL (ref 8.9–10.3)
Chloride: 108 mmol/L (ref 98–111)
Creatinine, Ser: 1.45 mg/dL — ABNORMAL HIGH (ref 0.44–1.00)
GFR, Estimated: 34 mL/min — ABNORMAL LOW (ref >60)
Glucose, Bld: 90 mg/dL (ref 70–99)
Potassium: 2.9 mmol/L — ABNORMAL LOW (ref 3.5–5.1)
Sodium: 137 mmol/L (ref 135–145)

## 2022-08-27 LAB — CBC
HCT: 29.9 % — ABNORMAL LOW (ref 36.0–46.0)
HCT: 31.5 % — ABNORMAL LOW (ref 36.0–46.0)
Hemoglobin: 10.1 g/dL — ABNORMAL LOW (ref 12.0–15.0)
Hemoglobin: 10.3 g/dL — ABNORMAL LOW (ref 12.0–15.0)
MCH: 31.3 pg (ref 26.0–34.0)
MCH: 30.3 pg (ref 26.0–34.0)
MCHC: 33.8 g/dL (ref 30.0–36.0)
MCHC: 32.7 g/dL (ref 30.0–36.0)
MCV: 92.6 fL (ref 80.0–100.0)
MCV: 92.6 fL (ref 80.0–100.0)
Platelets: 154 10*3/uL (ref 150–400)
Platelets: 154 10*3/uL (ref 150–400)
RBC: 3.23 MIL/uL — ABNORMAL LOW (ref 3.87–5.11)
RBC: 3.4 MIL/uL — ABNORMAL LOW (ref 3.87–5.11)
RDW: 14.3 % (ref 11.5–15.5)
RDW: 14.4 % (ref 11.5–15.5)
WBC: 12 10*3/uL — ABNORMAL HIGH (ref 4.0–10.5)
WBC: 13.3 10*3/uL — ABNORMAL HIGH (ref 4.0–10.5)
nRBC: 0 % (ref 0.0–0.2)
nRBC: 0 % (ref 0.0–0.2)

## 2022-08-27 LAB — GASTROINTESTINAL PANEL BY PCR, STOOL (REPLACES STOOL CULTURE)

## 2022-08-27 LAB — TROPONIN I (HIGH SENSITIVITY)
Troponin I (High Sensitivity): 3677 ng/L (ref <18)
Troponin I (High Sensitivity): 3299 ng/L (ref <18)
Troponin I (High Sensitivity): 2667 ng/L (ref <18)
Troponin I (High Sensitivity): 2308 ng/L (ref <18)

## 2022-08-27 LAB — POTASSIUM: Potassium: 3 mmol/L — ABNORMAL LOW (ref 3.5–5.1)

## 2022-08-27 LAB — CULTURE, BLOOD (ROUTINE X 2): Special Requests: ADEQUATE

## 2022-08-27 LAB — BRAIN NATRIURETIC PEPTIDE: B Natriuretic Peptide: 1712.6 pg/mL — ABNORMAL HIGH (ref 0.0–100.0)

## 2022-08-27 LAB — GLUCOSE, CAPILLARY
Glucose-Capillary: 128 mg/dL — ABNORMAL HIGH (ref 70–99)
Glucose-Capillary: 134 mg/dL — ABNORMAL HIGH (ref 70–99)

## 2022-08-27 LAB — CBG MONITORING, ED
Glucose-Capillary: 96 mg/dL (ref 70–99)
Glucose-Capillary: 122 mg/dL — ABNORMAL HIGH (ref 70–99)

## 2022-08-27 LAB — MAGNESIUM: Magnesium: 1.3 mg/dL — ABNORMAL LOW (ref 1.7–2.4)

## 2022-08-27 LAB — C DIFFICILE QUICK SCREEN W PCR REFLEX
C Diff antigen: NEGATIVE
C Diff interpretation: NOT DETECTED
C Diff toxin: NEGATIVE

## 2022-08-27 MED ORDER — HYDROCORTISONE 0.5 % EX CREA
TOPICAL_CREAM | Freq: Two times a day (BID) | CUTANEOUS | Status: DC
  Administered 2022-08-30: 1 via TOPICAL
  Filled 2022-08-27 (×2): qty 28.35

## 2022-08-27 MED ORDER — SODIUM CHLORIDE 0.9 % IV SOLN
500.0000 mg | INTRAVENOUS | Status: DC
  Administered 2022-08-27 – 2022-08-28 (×2): 500 mg via INTRAVENOUS
  Filled 2022-08-27 (×2): qty 500
  Filled 2022-08-27: qty 5

## 2022-08-27 MED ORDER — ASPIRIN 81 MG PO CHEW
324.0000 mg | CHEWABLE_TABLET | Freq: Once | ORAL | Status: AC
  Administered 2022-08-27: 324 mg via ORAL
  Filled 2022-08-27: qty 4

## 2022-08-27 MED ORDER — SODIUM CHLORIDE 0.9 % IV SOLN
2.0000 g | INTRAVENOUS | Status: DC
  Administered 2022-08-27 – 2022-09-02 (×7): 2 g via INTRAVENOUS
  Filled 2022-08-27 (×2): qty 20
  Filled 2022-08-27: qty 2
  Filled 2022-08-27 (×4): qty 20

## 2022-08-27 MED ORDER — CALCIUM GLUCONATE-NACL 2-0.675 GM/100ML-% IV SOLN
2.0000 g | Freq: Once | INTRAVENOUS | Status: AC
  Administered 2022-08-27: 2000 mg via INTRAVENOUS
  Filled 2022-08-27: qty 100

## 2022-08-27 MED ORDER — ISOSORBIDE MONONITRATE ER 30 MG PO TB24
30.0000 mg | ORAL_TABLET | Freq: Every day | ORAL | Status: DC
  Administered 2022-08-27 – 2022-09-02 (×7): 30 mg via ORAL
  Filled 2022-08-27 (×7): qty 1

## 2022-08-27 MED ORDER — POTASSIUM CHLORIDE 10 MEQ/100ML IV SOLN
10.0000 meq | INTRAVENOUS | Status: DC
  Administered 2022-08-27: 10 meq via INTRAVENOUS
  Filled 2022-08-27: qty 100

## 2022-08-27 MED ORDER — HEPARIN BOLUS VIA INFUSION
3600.0000 [IU] | Freq: Once | INTRAVENOUS | Status: AC
  Administered 2022-08-27: 3600 [IU] via INTRAVENOUS
  Filled 2022-08-27: qty 3600

## 2022-08-27 MED ORDER — POTASSIUM CHLORIDE 10 MEQ/100ML IV SOLN
10.0000 meq | INTRAVENOUS | Status: AC
  Administered 2022-08-27 (×3): 10 meq via INTRAVENOUS
  Filled 2022-08-27 (×2): qty 100

## 2022-08-27 MED ORDER — POTASSIUM CHLORIDE CRYS ER 20 MEQ PO TBCR
40.0000 meq | EXTENDED_RELEASE_TABLET | Freq: Once | ORAL | Status: DC
  Filled 2022-08-27: qty 2

## 2022-08-27 MED ORDER — MAGNESIUM SULFATE 4 GM/100ML IV SOLN
4.0000 g | Freq: Once | INTRAVENOUS | Status: AC
  Administered 2022-08-27: 4 g via INTRAVENOUS
  Filled 2022-08-27: qty 100

## 2022-08-27 MED ORDER — FUROSEMIDE 10 MG/ML IJ SOLN: 20.0000 mg | Freq: Once | INTRAMUSCULAR | Status: DC

## 2022-08-27 MED ORDER — MAGNESIUM SULFATE 2 GM/50ML IV SOLN: 2.0000 g | Freq: Once | INTRAVENOUS | Status: DC

## 2022-08-27 MED ORDER — TRAMADOL HCL 50 MG PO TABS
50.0000 mg | ORAL_TABLET | Freq: Four times a day (QID) | ORAL | Status: DC | PRN
  Administered 2022-08-27 – 2022-08-28 (×3): 50 mg via ORAL
  Filled 2022-08-27 (×3): qty 1

## 2022-08-27 MED ORDER — FUROSEMIDE 10 MG/ML IJ SOLN
20.0000 mg | Freq: Two times a day (BID) | INTRAMUSCULAR | Status: DC
  Administered 2022-08-27 – 2022-08-29 (×4): 20 mg via INTRAVENOUS
  Filled 2022-08-27 (×4): qty 2

## 2022-08-27 MED ORDER — HEPARIN (PORCINE) 25000 UT/250ML-% IV SOLN
850.0000 [IU]/h | INTRAVENOUS | Status: DC
  Administered 2022-08-27: 700 [IU]/h via INTRAVENOUS
  Administered 2022-08-28: 850 [IU]/h via INTRAVENOUS
  Filled 2022-08-27 (×2): qty 250

## 2022-08-27 NOTE — Progress Notes (Signed)
[facility] CLINIC CARDIOLOGY CONSULT NOTE       Patient ID: RYONNA CIMINI MRN: [id number] [name]/AGE: Dec 21, 1926 89 y.o.  Admit date: 08/26/2022 Referring Physician Dr. Lorretta Harp Primary Physician Dr. Larwance Sachs  Primary Cardiologist Dr. Lady Gary (seen once in 2019) Reason for Consultation possible atrial fibrillation  HPI: Kayda Allers is a 89yoF with a PMH of CKD 3, DM2, HFpEF (EF >55%, G1 DD 06/2022), renal artery stenosis s/p stenting, history of breast cancer who presented to [facility] ED 08/26/2022 from [facility] with reported vomiting and coffee-ground emesis for 2 week and weeks and right upper abdominal pain with initial concern for UGIB. Evaluated by GI, patient declined EGD. Hgb stable overnight. EKG demonstrates normal sinus rhythm without tele evidence of atrial fibrillation or other arrhythmias so far. The patient is being treated for streptococcus bacteremia and possible PNA. Stool pathogen panel positive for sapovirus. Troponin uptrending to current peak of 3677.   Interval History:  - overnight events noted - remains very fatigued and nauseated. No chest pain or shortness of breath  Review of systems complete and found to be negative unless listed above     Past Medical History:  Diagnosis Date   Arthritis    Gout   Chronic kidney disease    Diarrhea    Hypercholesteremia    Hypertension    Neuropathy    feet and lower legs   Seasonal allergies    Skin cancer    face    Past Surgical History:  Procedure Laterality Date   ABDOMINAL HYSTERECTOMY  2000   APPENDECTOMY     BREAST BIOPSY Right 09/19/2019   affirm bx of calcs UOQ, x marker, path pending   BREAST BIOPSY Right 09/19/2019   [place] bx of mass,heart marker, path pending   BREAST BIOPSY Right 09/19/2019   [place] bx of LN, coil marker, path pending   CATARACT EXTRACTION Left    CATARACT EXTRACTION W/PHACO Right 10/24/2014   Procedure: CATARACT EXTRACTION PHACO AND INTRAOCULAR LENS PLACEMENT (IOC);  Surgeon: Lockie Mola, MD;  Location: [facility];  Service: Ophthalmology;  Laterality: Right;   ESOPHAGOGASTRODUODENOSCOPY N/A 03/07/2018   Procedure: ESOPHAGOGASTRODUODENOSCOPY (EGD);  Surgeon: Toney Reil, MD;  Location: [facility] ENDOSCOPY;  Service: Gastroenterology;  Laterality: N/A;   MASTECTOMY MODIFIED RADICAL Right 10/13/2019   Procedure: MASTECTOMY MODIFIED RADICAL;  Surgeon: Earline Mayotte, MD;  Location: [facility] ORS;  Service: General;  Laterality: Right;   RENAL ANGIOGRAPHY Right 04/11/2018   Procedure: RENAL ANGIOGRAPHY;  Surgeon: Annice Needy, MD;  Location: [facility] INVASIVE CV LAB;  Service: Cardiovascular;  Laterality: Right;   SIMPLE MASTECTOMY WITH AXILLARY SENTINEL NODE BIOPSY Left 10/13/2019   Procedure: SIMPLE MASTECTOMY TRUE CUT BIOPSY, SENTINEL NODE BIOPSY;  Surgeon: Earline Mayotte, MD;  Location: [facility] ORS;  Service: General;  Laterality: Left;    (Not in a hospital admission)  Social History   Socioeconomic History   Marital status: Widowed    Spouse name: Not on file   Number of children: Not on file   Years of education: Not on file   Highest education level: Not on file  Occupational History   Not on file  Tobacco Use   Smoking status: Never   Smokeless tobacco: Never  Substance and Sexual Activity   Alcohol use: No   Drug use: Never   Sexual activity: Not on file  Other Topics Concern   Not on file  Social History Narrative   2 daughters; lives in [place];  teacher retd. 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    Social Determinants of Health   Financial Resource Strain: Not on file  Food Insecurity: Not on file  Transportation Needs: Patient Declined (06/06/2022)   PRAPARE - Administrator, Civil Service (Medical): Patient declined    Lack of Transportation (Non-Medical): Patient declined  Physical Activity: Not on file  Stress: Not on file  Social Connections: Not on file  Intimate Partner Violence: Patient Declined  (06/06/2022)   Humiliation, Afraid, Rape, and Kick questionnaire    Fear of Current or Ex-Partner: Patient declined    Emotionally Abused: Patient declined    Physically Abused: Patient declined    Sexually Abused: Patient declined    Family History  Problem Relation Age of Onset   Hypertension Mother    Hypertension Father    Stroke Father    Breast cancer Daughter 35      Intake/Output Summary (Last 24 hours) at 08/27/2022 1230 Last data filed at 08/27/2022 1117 Gross per 24 hour  Intake 1035.69 ml  Output --  Net 1035.69 ml     Vitals:   08/27/22 1030 08/27/22 1100 08/27/22 1130 08/27/22 1200  BP: 131/81 (!) 114/49 (!) 101/44 (!) 95/43  Pulse: 65 60 (!) 59 (!) 58  Resp: (!) Temp:      TempSrc:      SpO2: 100% 99% 99% 98%  Weight:      Height:        PHYSICAL EXAM General: elderly ill appearing caucasian female, sitting upright in ED Stretcher with close friend at bedside HEENT:  Normocephalic and atraumatic. Neck:  No JVD.  Chest: large demarcated area of erythema extending from right axilla and shoulder across chest, non tender, warm to touch  Lungs: Normal respiratory effort on 2L by [place]. Decreased breath sounds with crackles right base Heart: HRRR . Normal S1 and S2 without gallops or murmurs.  Abdomen: Non-distended appearing.  Msk: Normal strength and tone for age. Extremities: Warm and well perfused. No clubbing, cyanosis. No peripheral edema.  Neuro: Alert and oriented X 3. Psych:  Answers questions appropriately.   Labs: Basic Metabolic Panel: Recent Labs    08/26/22 0526 08/26/22 1835 08/27/22 0426  NA 139 136 137  K 2.5* 3.4* 2.9*  CL 106 106 108  CO2 21* 17* 20*  GLUCOSE 182* 157* 90  BUN CREATININE 1.34* 1.48* 1.45*  CALCIUM 7.3* 6.5* 6.1*  MG 1.4*  --  1.3*  PHOS 1.7* 4.8*  --     Liver Function Tests: Recent Labs    08/26/22 0526  AST 56*  ALT 35  ALKPHOS 72  BILITOT 1.1  PROT 7.4  ALBUMIN 3.9     Recent Labs    08/26/22 0526  LIPASE 26    CBC: Recent Labs    08/26/22 0526 08/26/22 0754 08/27/22 0426 08/27/22 0742  WBC 6.4   < > 12.0* 13.3*  NEUTROABS 5.3  --   --   --   HGB 11.9*   < > 10.1* 10.3*  HCT 35.1*   < > 29.9* 31.5*  MCV 92.1   < > 92.6 92.6  PLT 176   < > 154 154   < > = values in this interval not displayed.    Cardiac Enzymes: Recent Labs    08/26/22 0754 08/27/22 0426 08/27/22 1019  TROPONINIHS 856* 3,677* 3,299*    BNP: Recent Labs  08/26/22 0526 08/27/22 0742  BNP 621.0* 1,712.6*    D-Dimer: Recent Labs    08/26/22 1200  DDIMER 1.78*   Hemoglobin A1C: Recent Labs    08/26/22 0754  HGBA1C 7.0*   Fasting Lipid Panel: Recent Labs    08/27/22 0426  CHOL 75  HDL 22*  LDLCALC 34  TRIG 97  CHOLHDL 3.4   Thyroid Function Tests: No results for input(s): "TSH", "T4TOTAL", "T3FREE", "THYROIDAB" in the last 72 hours.  Invalid input(s): "FREET3" Anemia Panel: No results for input(s): "VITAMINB12", "FOLATE", "FERRITIN", "TIBC", "IRON", "RETICCTPCT" in the last 72 hours.   Radiology: US Venous Img Lower Bilateral (DVT)  Result Date: 08/26/2022 CLINICAL DATA:  220372 Positive D dimer 220372. EXAM: BILATERAL LOWER EXTREMITY VENOUS DOPPLER ULTRASOUND TECHNIQUE: Gray-scale sonography with compression, as well as color and duplex ultrasound, were performed to evaluate the deep venous system(s) from the level of the common femoral vein through the popliteal and proximal calf veins. COMPARISON:  None Available. FINDINGS: VENOUS Normal compressibility of the common femoral, superficial femoral, and popliteal veins, as well as the visualized calf veins. Visualized portions of profunda femoral vein and great saphenous vein unremarkable. No filling defects to suggest DVT on grayscale or color Doppler imaging. Doppler waveforms show normal direction of venous flow, normal respiratory plasticity and response to augmentation. OTHER Cyst in the  left popliteal fossa measuring up to 5 cm, likely Baker's cyst. Limitations: none IMPRESSION: 1. No lower extremity DVT. 2. 5 cm left Baker's cyst. Electronically Signed   By: Orvan Falconer M.D.   On: 08/26/2022 15:45   NM Pulmonary Perfusion  Result Date: 08/26/2022 CLINICAL DATA:  Pulmonary embolus suspected EXAM: NUCLEAR MEDICINE PERFUSION LUNG SCAN TECHNIQUE: Perfusion images were obtained in multiple projections after intravenous injection of radiopharmaceutical. Ventilation scans intentionally deferred if perfusion scan and chest x-ray adequate for interpretation during COVID 19 epidemic. RADIOPHARMACEUTICALS:  4.2 mCi Tc-20m MAA IV COMPARISON:  None Available. FINDINGS: Bilateral mild-to-moderate perfusion defects. No large perfusion defects. IMPRESSION: Nondiagnostic (intermediate probability). Correlate with lower extremity ultrasound and consider chest CTA for further evaluation. Electronically Signed   By: Allegra Lai M.D.   On: 08/26/2022 15:10   DG Chest Port 1 View  Result Date: 08/26/2022 CLINICAL DATA:  89 year old female with history of hematemesis and generalized weakness. EXAM: PORTABLE CHEST 1 VIEW COMPARISON:  Chest x-ray 06/07/2022. FINDINGS: Lung volumes are low. Bibasilar opacities favored to reflect areas of subsegmental atelectasis. Diffuse interstitial prominence and widespread peribronchial cuffing. No definite pleural effusions. No pneumothorax. No evidence of pulmonary edema. Heart size is normal. Upper mediastinal contours are within normal limits allowing for patient positioning. Atherosclerotic calcifications in the thoracic aorta. IMPRESSION: 1. The appearance of the chest is concerning for probable acute bronchitis, as above. Some degree of underlying interstitial lung disease is also suspected. 2. Aortic atherosclerosis. Electronically Signed   By: Trudie Reed M.D.   On: 08/26/2022 05:51   CT Angio Chest/Abd/Pel for Dissection W and/or Wo Contrast  Result  Date: 08/22/2022 CLINICAL DATA:  Vomiting and diarrhea. Concern for acute aortic syndrome. History of breast cancer. EXAM: CT ANGIOGRAPHY CHEST, ABDOMEN AND PELVIS TECHNIQUE: Non-contrast CT of the chest was initially obtained. Multidetector CT imaging through the chest, abdomen and pelvis was performed using the standard protocol during bolus administration of intravenous contrast. Multiplanar reconstructed images and MIPs were obtained and reviewed to evaluate the vascular anatomy. RADIATION DOSE REDUCTION: This exam was performed according to the departmental dose-optimization program which includes automated exposure control,  adjustment of the mA and/or kV according to patient size and/or use of iterative reconstruction technique. CONTRAST:  80mL OMNIPAQUE IOHEXOL 350 MG/ML SOLN COMPARISON:  CT chest dated June 08, 2022. CT abdomen pelvis dated March 18, 2022. FINDINGS: CTA CHEST FINDINGS Cardiovascular: Preferential opacification of the thoracic aorta. No evidence of thoracic aortic aneurysm or dissection. No intramural hematoma. Coronary, aortic arch, and branch vessel atherosclerotic vascular disease. Normal heart size. No pericardial effusion. No pulmonary embolism. Mediastinum/Nodes: No enlarged mediastinal, hilar, or axillary lymph nodes. Thyroid gland, trachea, and esophagus demonstrate no significant findings. Lungs/Pleura: Bibasilar scarring. No focal consolidation, pleural effusion, or pneumothorax. Musculoskeletal: Status post bilateral mastectomies. No acute or significant osseous findings. Review of the MIP images confirms the above findings. CTA ABDOMEN AND PELVIS FINDINGS VASCULAR Aorta: Unchanged infrarenal abdominal aortic aneurysm measuring up to 3.6 cm in diameter. No dissection, vasculitis or significant stenosis. Calcified and noncalcified atherosclerotic plaque. 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  Appearance is suggestive of median arcuate ligament compression. 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 Renals: Single renal arteries bilaterally. Chronic severe stenosis of the distal left renal artery with severe left renal atrophy. Patent right proximal renal artery stent. IMA: Patent without evidence of aneurysm, dissection, vasculitis or significant stenosis. Inflow: Patent without evidence of aneurysm, dissection, vasculitis or significant stenosis. Veins: No obvious venous abnormality within the limitations of this arterial phase study. Review of the MIP images confirms the above findings. NON-VASCULAR Hepatobiliary: No focal liver abnormality is seen. No gallstones, gallbladder wall thickening, or biliary dilatation. Pancreas: No ductal dilatation or surrounding inflammatory changes. Multiple cystic pancreatic lesions measuring up to 2.6 cm, previously 2.5 cm. Spleen: Normal in size without focal abnormality. Adrenals/Urinary Tract: Adrenal glands are unremarkable. Chronic severe left renal atrophy. No renal calculi or hydronephrosis. Bladder is unremarkable. Stomach/Bowel: Unchanged small hiatal hernia. No bowel wall thickening, distention, or surrounding inflammatory changes. History of prior appendectomy. Lymphatic: No enlarged abdominal or pelvic lymph nodes. Reproductive: Status post hysterectomy. No adnexal masses. Other: No free fluid or pneumoperitoneum. Musculoskeletal: No acute or significant osseous findings. Review of the MIP images confirms the above findings. IMPRESSION: 1. No evidence of acute aortic syndrome. 2. Unchanged infrarenal abdominal aortic aneurysm measuring up to 3.6 cm in diameter. Recommend follow-up ultrasound every 2 years. 3. 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 No evidence of bowel ischemia. 4. Multiple cystic pancreatic lesions measuring up to 2.6 cm. Given the patient's age, no routine follow-up is recommended. 5.  Aortic atherosclerosis  (ICD10-I70.0). Electronically Signed   By: Obie Dredge M.D.   On: 08/22/2022 13:34   CT HEAD WO CONTRAST ( )  Result Date: 08/22/2022 CLINICAL DATA:  Acute onset of severe headache. EXAM: CT HEAD WITHOUT CONTRAST TECHNIQUE: Contiguous axial images were obtained from the base of the skull through the vertex without intravenous contrast. RADIATION DOSE REDUCTION: This exam was performed according to the departmental dose-optimization program which includes automated exposure control, adjustment of the mA and/or kV according to patient size and/or use of iterative reconstruction technique. COMPARISON:  06/05/2022 FINDINGS: Brain: No evidence of intracranial hemorrhage, acute infarction, hydrocephalus, extra-axial collection, or mass lesion/mass effect. Mild cerebral atrophy and chronic small vessel disease again noted. Vascular:  No hyperdense vessel or other acute findings. Skull: No evidence of fracture or other significant bone abnormality. Sinuses/Orbits:  No acute findings. Other: None. IMPRESSION: No acute intracranial abnormality. Stable mild cerebral  atrophy and chronic small vessel disease. Electronically Signed   By: Danae Orleans M.D.   On: 08/22/2022 13:22   DG Bone Density  Result Date: 08/20/2022 EXAM: DUAL X-RAY ABSORPTIOMETRY (DXA) FOR BONE MINERAL DENSITY IMPRESSION: Your patient Alyciana Sewall completed a BMD test on 08/20/2022 using the [employer] DXA System (software version: 14.10) manufactured by [employer]. The following summarizes the results of our evaluation. Technologist: [facility] PATIENT BIOGRAPHICAL: Name: Aiylah, Baeza Patient ID: [id number] Birth Date: [birth year] Height: 64.0 in. Gender: Female Exam Date: 08/20/2022 Weight: 135.1 lbs. Indications: History of Breast Cancer, Advanced Age, Hysterectomy, Caucasian, History of Radiation, Oophorectomy Bilateral, Postmenopausal Fractures: Treatments: Anastrozole, Calcium, Prolia, Vitamin D DENSITOMETRY RESULTS: Site          Region     Measured Date Measured Age WHO Classification Young Adult T-score BMD         %Change vs. Previous Significant Change (*) DualFemur Neck Right 08/20/2022 93.2 Osteoporosis -2.7 0.669 g/cm2 2.3% - DualFemur Neck Right 03/07/2020 90.8 Osteoporosis -2.8 0.654 g/cm2 - - DualFemur Total Mean 08/20/2022 93.2 Osteoporosis -2.6 0.685 g/cm2 1.3% - DualFemur Total Mean 03/07/2020 90.8 Osteoporosis -2.6 0.676 g/cm2 - - Left Forearm Radius 33% 08/20/2022 93.2 Osteoporosis -3.1 0.604 g/cm2 10.2% Yes Left Forearm Radius 33% 03/07/2020 90.8 Osteoporosis -3.7 0.548 g/cm2 - - ASSESSMENT: The BMD measured at Forearm Radius 33% is 0.604 g/cm2 with a T-score of -3.1. This patient is considered osteoporotic according to World Health Organization [facility]) criteria. The scan quality is good. Lumbar spine was not utilized due to advanced degenerative changes. Compared with prior study, there has been no significant change in the total hip. World Science writer [facility]) criteria for post-menopausal, Caucasian Women: Normal:                   T-score at or above -1 SD Osteopenia/low bone mass: T-score between -1 and -2.5 SD Osteoporosis:             T-score at or below -2.5 SD RECOMMENDATIONS: 1. All patients should optimize calcium and vitamin D intake. 2. Consider FDA-approved medical therapies in postmenopausal women and men aged 11 years and older, based on the following: a. A hip or vertebral(clinical or morphometric) fracture b. T-score < -2.5 at the femoral neck or spine after appropriate evaluation to exclude secondary causes c. Low bone mass (T-score between -1.0 and -2.5 at the femoral neck or spine) and a 10-year probability of a hip fracture > 3% or a 10-year probability of a major osteoporosis-related fracture > 20% based on the [place]-adapted WHO algorithm 3. Clinician judgment and/or patient preferences may indicate treatment for people with 10-year fracture probabilities above or below these levels FOLLOW-UP: People  with diagnosed cases of osteoporosis or at high risk for fracture should have regular bone mineral density tests. For patients eligible for Medicare, routine testing is allowed once every 2 years. The testing frequency can be increased to one year for patients who have rapidly progressing disease, those who are receiving or discontinuing medical therapy to restore bone mass, or have additional risk factors. I have reviewed this report, and agree with the above findings. [facility] Electronically Signed   By: Gerome Sam III M.D.   On: 08/20/2022 17:57    ECHO 06/29/2022 DOPPLER ECHO and OTHER SPECIAL PROCEDURES                 Aortic: MODERATE AR  MILD AS                         173.0 cm/sec peak vel      12.0 mmHg peak grad                         5.6 mmHg mean grad         1.6 cm^2 by DOPPLER                 Mitral: No MR                      MILD MS                         MV Inflow E Vel = 71.4 cm/sec     MV Annulus E'Vel = 6.0 cm/sec                         E/E'Ratio = 11.9              Tricuspid: MILD TR                    No TS                         248.0 cm/sec peak TR vel   27.6 mmHg peak RV pressure              Pulmonary: MILD PR                    No PS  _________________________________________________________________________________________  INTERPRETATION  NORMAL LEFT VENTRICULAR SYSTOLIC FUNCTION  NORMAL RIGHT VENTRICULAR SYSTOLIC FUNCTION  MODERATE VALVULAR REGURGITATION (See above)  MILD VALVULAR STENOSIS (See above)  _________________________________________________________________________________________  Electronically signed by      Danella Penton, MD on 06/30/2022 05: 31 PM           Performed By: Rayetta Humphrey, RCS     Ordering Physician: Ned Clines   TELEMETRY reviewed by me (LT) 08/27/2022 : NSR rate 70s -80s with occasional PACs  EKG reviewed by me: normal sinus rhythm with nonspecific T wave changes that are similar to  prior study from April 13.  Data reviewed by me (LT) 08/27/2022: GI note, hospitalist progress notelast 24h vitals tele labs imaging I/O    Principal Problem:   Hematemesis Active Problems:   Gout   Anemia of chronic renal failure, stage 3b   Depression   Essential hypertension   NSTEMI (non-ST elevated myocardial infarction)   Hypertensive urgency   Hypokalemia   Hypocalcemia   Chronic diastolic CHF (congestive heart failure)   Acute blood loss anemia   Hypomagnesemia   Hypophosphatemia   Type II diabetes mellitus with renal manifestations   Rash   Chest pain    ASSESSMENT AND PLAN:  Dorita Fray is a 89yoF with a PMH of CKD 3, DM2, HFpEF (EF >55%, G1 DD 06/2022), renal artery stenosis s/p stenting, history of breast cancer who presented to [facility] ED 08/26/2022 from [facility] with reported vomiting and coffee-ground emesis for 2 week and weeks and right upper abdominal pain with initial concern for UGIB. Evaluated by GI, patient declined EGD. Hgb stable overnight. EKG demonstrates normal sinus rhythm without tele evidence of atrial fibrillation or other arrhythmias so far.  The patient is being treated for streptococcus bacteremia and possible PNA. Stool pathogen panel positive for sapovirus. Troponin uptrending to current peak of 3677.   # streptococcus bacteremia  # possible PNA  # hematemesis  # sapovirus  Agree with current therapy per primary team, H/H stable overnight. Febrile yesterday PM with Tmax 103.7. On azithromycin and ceftriaxone per primary team.   # NSTEMI Troponins elevated and uptrending from 88, 856, to peak of 3677 this AM. EKG with nonspecific TW changes. Chest pain free this AM, just feeling generally tired.  - give 325mg  Aspirin x 1, then 81mg  daily thereafter - ok with GI to start heparin per pharmacy protocol, continue for 48hrs - continue atorvastatin 40mg  daily  - continue metoprolol XL - continue isosorbide 30mg  daily  - echo complete  - likely  continue to treat medically with 48hrs heparin + addition of plavix for DAPT x 6 months in the absence of chest pain, and in setting of infection / fever from bacteremia and GI illness, multiple co morbidities, and elderly age.   # electrolyte disturbances # normal sinus rhythm  Presents to [facility] with profuse nausea and vomiting and diarrhea with poor p.o. intake reportedly for the past month.  EKG read by the computer as atrial fibrillation, but this is incorrect.  Read by myself and Dr. Darrold Junker appears to be normal sinus rhythm with occasional premature atrial contractions noted on telemetry without evidence of atrial fibrillation, or other arrhythmias noted so far. -Agree with current therapy per primary team -Recommend monitoring and replenishing electrolytes for goal K >4, and mag >2 -recommending holding jardiance for now  # ? Drug rash Large demarcated area of erythema across entire chest, not itchy or tender per patient. Query if related to jardiance or maybe yeast?  - management per primary   This patient's plan of care was discussed and created with Dr. Darrold Junker and he is in agreement.  Signed: Rebeca Allegra , PA-C 08/27/2022, 12:30 PM [facility]

## 2022-08-27 NOTE — Progress Notes (Signed)
ANTICOAGULATION CONSULT NOTE - Initial Consult  Pharmacy Consult for heparin Indication: NSTEMI  Allergies  Allergen Reactions   Penicillins Anaphylaxis   Drug Ingredient [Zinc]     Patient Measurements: Height: 5\' 4"  (162.6 cm) Weight: 60 kg (132 lb 4.4 oz) IBW/kg (Calculated) : 54.7 Heparin Dosing Weight: 60 kg  Vital Signs: Temp: 97.8 F (36.6 C) (04/18 1715) BP: 130/63 (04/18 1715) Pulse Rate: 73 (04/18 1715)  Labs: Recent Labs    08/26/22 0526 08/26/22 0754 08/26/22 1835 08/27/22 0426 08/27/22 0742 08/27/22 1019 08/27/22 1724 08/27/22 1815  HGB 11.9*   < > 10.6* 10.1* 10.3*  --   --   --   HCT 35.1*   < > 31.4* 29.9* 31.5*  --   --   --   PLT 176   < > 166 154 154  --   --   --   APTT 33  --   --   --   --   --   --   --   LABPROT 18.1*  --   --   --   --   --   --   --   INR 1.5*  --   --   --   --   --   --   --   HEPARINUNFRC  --   --   --   --   --   --   --  0.30  CREATININE 1.34*  --  1.48* 1.45*  --   --   --   --   TROPONINIHS 88*   < >  --  9,169*  --  3,299* 2,308*  --    < > = values in this interval not displayed.     Estimated Creatinine Clearance: 20.9 mL/min (A) (by C-G formula based on SCr of 1.45 mg/dL (H)).   Medical History: Past Medical History:  Diagnosis Date   Arthritis    Gout   Chronic kidney disease    Diarrhea    Hypercholesteremia    Hypertension    Neuropathy    feet and lower legs   Seasonal allergies    Skin cancer    face    Medications:  No evidence of PTA anticoagulation   Assessment: Pharmacy consulted to dose heparin in this 89 yo F who presents to the ED with Hematemesis.  Trops 856 > 3677.  Cardiology and GI both consulted, GI has cleared pt for receiving heparin gtt.  Baseline Labs: aPTT - 33; INR - 1.5 Hgb - 10.3; Plts - 154  Goal of Therapy:  Anti-xa 0.3-0.7 / aPTT 66-102s Monitor platelets by anticoagulation protocol: Yes  Date Time HL Rate/Comment 4/18 1815 0.30 Therapeutic x 1    Plan:   Heparin therapeutic x 1  Continue heparin infusion at 700 units/hr Check anti-Xa level in 8 hours to confirm Continue to monitor H&H and platelets daily while on heparin gtt.  Sharen Hones, PharmD, BCPS Clinical Pharmacist   08/27/2022,7:17 PM

## 2022-08-27 NOTE — Progress Notes (Signed)
PHARMACY CONSULT NOTE - FOLLOW UP  Pharmacy Consult for Electrolyte Monitoring and Replacement   Recent Labs: Potassium (mmol/L)  Date Value  08/27/2022 2.9 (L)   Magnesium (mg/dL)  Date Value  35/45/6256 1.3 (L)   Calcium (mg/dL)  Date Value  38/93/7342 6.1 (LL)   Albumin (g/dL)  Date Value  87/68/1157 3.9   Phosphorus (mg/dL)  Date Value  26/20/3559 4.8 (H)   Sodium (mmol/L)  Date Value  08/27/2022 137     Assessment: Potassium, Magnesium and Calcium all low Phosphorus elevated after replacement on 4/17  Goal of Therapy:  Electrolytes WNL  Plan:  Kcl 10 mEq IV x 4 Magnesium sulfate 2 gm IV x 1 Calcium gluconate 2 gm IV x 1 Recheck K+ 1 hour after last dose infused Recheck BMP, Mag and Phos with 4/19 AM labs.  Manfred Shirts ,PharmD Clinical Pharmacist 08/27/2022 10:01 AM

## 2022-08-27 NOTE — Progress Notes (Signed)
[facility] GI inpatient brief Progress Note  Patient with fatigue, fever (103), elevated Troponin this AM. Anticoagulation therapy has been prescribed (heparin). Tolerated low residue diet.  Vitals:   08/27/22 1230 08/27/22 1300  BP: (!) 105/53 (!) 120/57  Pulse: 70 (!) 59  Resp:  18  Temp:    SpO2: 98% 98%       Labs:    Latest Ref Rng & Units 08/27/2022    7:42 AM 08/27/2022    4:26 AM 08/26/2022    6:35 PM  CBC  WBC 4.0 - 10.5 K/uL 13.3  12.0  10.4   Hemoglobin 12.0 - 15.0 g/dL 66.2  94.7  65.4   Hematocrit 36.0 - 46.0 % 31.5  29.9  31.4   Platelets 150 - 400 K/uL 154  154  166     CMP     Component Value Date/Time   NA 137 08/27/2022 0426   K 3.0 (L) 08/27/2022 1257   CL 108 08/27/2022 0426   CO2 20 (L) 08/27/2022 0426   GLUCOSE 90 08/27/2022 0426   BUN 22 08/27/2022 0426   CREATININE 1.45 (H) 08/27/2022 0426   CALCIUM 6.1 (LL) 08/27/2022 0426   PROT 7.4 08/26/2022 0526   ALBUMIN 3.9 08/26/2022 0526   AST 56 (H) 08/26/2022 0526   ALT 35 08/26/2022 0526   ALKPHOS 72 08/26/2022 0526   BILITOT 1.1 08/26/2022 0526   GFRNONAA 34 (L) 08/27/2022 0426   GFRAA 28 (L) 01/18/2020 0935      Impression:   Elevated Troponin (3677).  NSTEMI  2.    Hemetemesis, non-recurrent - Patient wishing to avoid invasive testing.  3.    Fever 4.    Streptococcus bacteremia.   Plan:  Heart care per cardiology 2.   Continue serial H/H. 3.   Continue acid suppression. 4.   Will follow peripherally for any drop in Hgb.   Thank you  T. Bing Plume, M.D. ABIM Diplomate in Gastroenterology [facility] [phone] - Cell

## 2022-08-27 NOTE — Consult Note (Signed)
NAME: Tina Curtis  [name]: 1926-08-12  MRN: [id number]  Date/Time: 08/27/2022 8:40 PM  REQUESTING PROVIDER: Dr.PAtel Subjective:  REASON FOR CONSULT: streptococcus bacteremia ? Tina Curtis is a 89 y.o. with a history of HTN, CKD , Type II DM, CHF, breast ca presented to the ED on 08/26/22 with hematemesis, vomiting , diarrhea and chest pain. She has had n/v/d for 2 weeks. She was seen in the ED on 08/22/22 and underwent CTA of abd/pelvis and chest and diagnosed as Gastroenteritis. She went to PCP on 4/15 for high BP due to not able to take meds- She was diagnosed with gastroenteritis On Tuesday night she had chills and could not get herself comfortable under 6 blankets. [place] in [place] her own and she called them and was brought by EMS to the ED She did not think she had black colored vomit, eventhough it is reported In the vitals-initially temp was normal but it went upto to 103  08/26/22 05:10  BP 203/89 (H)  Pulse Rate 87  Resp 16  SpO2 91 %   Labs  Latest Reference Range & Units 08/26/22 18:35  WBC 4.0 - 10.5 K/uL 10.4  Hemoglobin 12.0 - 15.0 g/dL 52.8 (L)  HCT 41.3 - 24.4 % 31.4 (L)  Platelets 150 - 400 K/uL 166  Creatinine 0.44 - 1.00 mg/dL 0.10 (H)    CXR showed interstitial markings and peribronchial thickening Blood culture sent and she was started on ceftriaxone and azithromycin GI panel came back positive for Sapo virus She was in the hospital in jan 2024 with dizziness an dorthostatis and was diagnosed as pneumonia   I am seeing her as blood culture positive for streptococcus Past Medical History:  Diagnosis Date   Arthritis    Gout   Chronic kidney disease    Diarrhea    Hypercholesteremia    Hypertension    Neuropathy    feet and lower legs   Seasonal allergies    Skin cancer    face    Past Surgical History:  Procedure Laterality Date   ABDOMINAL HYSTERECTOMY  2000   APPENDECTOMY     BREAST BIOPSY Right 09/19/2019   affirm bx of calcs UOQ, x  marker, path pending   BREAST BIOPSY Right 09/19/2019   [place] bx of mass,heart marker, path pending   BREAST BIOPSY Right 09/19/2019   [place] bx of LN, coil marker, path pending   CATARACT EXTRACTION Left    CATARACT EXTRACTION W/PHACO Right 10/24/2014   Procedure: CATARACT EXTRACTION PHACO AND INTRAOCULAR LENS PLACEMENT (IOC);  Surgeon: Lockie Mola, MD;  Location: [facility];  Service: Ophthalmology;  Laterality: Right;   ESOPHAGOGASTRODUODENOSCOPY N/A 03/07/2018   Procedure: ESOPHAGOGASTRODUODENOSCOPY (EGD);  Surgeon: Toney Reil, MD;  Location: [facility];  Service: Gastroenterology;  Laterality: N/A;   MASTECTOMY MODIFIED RADICAL Right 10/13/2019   Procedure: MASTECTOMY MODIFIED RADICAL;  Surgeon: Earline Mayotte, MD;  Location: [facility] ORS;  Service: General;  Laterality: Right;   RENAL ANGIOGRAPHY Right 04/11/2018   Procedure: RENAL ANGIOGRAPHY;  Surgeon: Annice Needy, MD;  Location: [facility] INVASIVE CV LAB;  Service: Cardiovascular;  Laterality: Right;   SIMPLE MASTECTOMY WITH AXILLARY SENTINEL NODE BIOPSY Left 10/13/2019   Procedure: SIMPLE MASTECTOMY TRUE CUT BIOPSY, SENTINEL NODE BIOPSY;  Surgeon: Earline Mayotte, MD;  Location: [facility] ORS;  Service: General;  Laterality: Left;    Social History   Socioeconomic History   Marital status: Widowed    Spouse name: Not on  file   Number of children: Not on file   Years of education: Not on file   Highest education level: Not on file  Occupational History   Not on file  Tobacco Use   Smoking status: Never   Smokeless tobacco: Never  Substance and Sexual Activity   Alcohol use: No   Drug use: Never   Sexual activity: Not on file  Other Topics Concern   Not on file  Social History Narrative   2 daughters; lives in [address]; Runner, broadcasting/film/video retd. 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    Social Determinants of Health   Financial Resource Strain: Not on file  Food Insecurity: No Food Insecurity  (08/27/2022)   Hunger Vital Sign    Worried About Running Out of Food in the Last Year: Never true    Ran Out of Food in the Last Year: Never true  Transportation Needs: No Transportation Needs (08/27/2022)   PRAPARE - Administrator, Civil Service (Medical): No    Lack of Transportation (Non-Medical): No  Physical Activity: Not on file  Stress: Not on file  Social Connections: Not on file  Intimate Partner Violence: Not At Risk (08/27/2022)   Humiliation, Afraid, Rape, and Kick questionnaire    Fear of Current or Ex-Partner: No    Emotionally Abused: No    Physically Abused: No    Sexually Abused: No    Family History  Problem Relation Age of Onset   Hypertension Mother    Hypertension Father    Stroke Father    Breast cancer Daughter 69   Allergies  Allergen Reactions   Penicillins Anaphylaxis   Drug Ingredient [Zinc]    I? Current Facility-Administered Medications  Medication Dose Route Frequency Provider Last Rate Last Admin   acetaminophen (TYLENOL) tablet 650 mg  650 mg Oral Q6H PRN Lorretta Harp, MD   650 mg at 08/27/22 1506   albuterol (PROVENTIL) (2.5 MG/3ML) 0.083% nebulizer solution 3 mL  3 mL Inhalation Q4H PRN Lorretta Harp, MD       allopurinol (ZYLOPRIM) tablet 100 mg  100 mg Oral Daily Lorretta Harp, MD       anastrozole (ARIMIDEX) tablet 1 mg  1 mg Oral Daily Lorretta Harp, MD   1 mg at 08/27/22 0947   atorvastatin (LIPITOR) tablet 40 mg  40 mg Oral Daily Lorretta Harp, MD   40 mg at 08/27/22 0946   azithromycin (ZITHROMAX) 500 mg in sodium chloride 0.9 % 250 mL IVPB  500 mg Intravenous Q24H Enedina Finner, MD       cefTRIAXone (ROCEPHIN) 2 g in sodium chloride 0.9 % 100 mL IVPB  2 g Intravenous Q24H Enedina Finner, MD   Stopped at 08/27/22 0947   cholecalciferol (VITAMIN D3) tablet 2,000 Units  2,000 Units Oral Daily Lorretta Harp, MD   2,000 Units at 08/27/22 0944   citalopram (CELEXA) tablet 10 mg  10 mg Oral Daily Lorretta Harp, MD   10 mg at 08/27/22 1610   cyanocobalamin  (VITAMIN B12) tablet 1,000 mcg  1,000 mcg Oral Daily Lorretta Harp, MD   1,000 mcg at 08/27/22 0946   dextromethorphan-guaiFENesin (MUCINEX DM) 30-600 MG per 12 hr tablet 1 tablet  1 tablet Oral BID PRN Lorretta Harp, MD       docusate sodium (COLACE) capsule 100 mg  100 mg Oral Daily PRN Lorretta Harp, MD       DULoxetine (CYMBALTA) DR capsule 60 mg  60 mg Oral Daily Lorretta Harp,  MD   60 mg at 08/27/22 0944   furosemide (LASIX) injection 20 mg  20 mg Intravenous BID Enedina Finner, MD   20 mg at 08/27/22 1816   gabapentin (NEURONTIN) capsule 300 mg  300 mg Oral QHS Lorretta Harp, MD   300 mg at 08/26/22 2315   heparin ADULT infusion 100 units/mL (25000 units/258mL)  700 Units/hr Intravenous Continuous Rebeca Allegra, PA-C 7 mL/hr at 08/27/22 1610 700 Units/hr at 08/27/22 9604   hydrALAZINE (APRESOLINE) injection 5 mg  5 mg Intravenous Q2H PRN Lorretta Harp, MD       hydrALAZINE (APRESOLINE) tablet 25 mg  25 mg Oral TID Lorretta Harp, MD   25 mg at 08/27/22 1815   hydrocortisone cream 0.5 %   Topical BID Enedina Finner, MD       insulin aspart (novoLOG) injection 0-5 Units  0-5 Units Subcutaneous QHS Lorretta Harp, MD       insulin aspart (novoLOG) injection 0-9 Units  0-9 Units Subcutaneous TID WC Lorretta Harp, MD   1 Units at 08/27/22 1746   isosorbide mononitrate (IMDUR) 24 hr tablet 30 mg  30 mg Oral Daily Rebeca Allegra, PA-C   30 mg at 08/27/22 0944   loratadine (CLARITIN) tablet 10 mg  10 mg Oral Daily PRN Lorretta Harp, MD       metoprolol succinate (TOPROL-XL) 24 hr tablet 37.5 mg  37.5 mg Oral QHS Lorretta Harp, MD   37.5 mg at 08/26/22 2314   morphine (PF) 2 MG/ML injection 0.5 mg  0.5 mg Intravenous Q4H PRN Lorretta Harp, MD       nitroGLYCERIN (NITROSTAT) SL tablet 0.4 mg  0.4 mg Sublingual Q5 min PRN Lorretta Harp, MD       ondansetron [facility]) injection 4 mg  4 mg Intravenous Q8H PRN Lorretta Harp, MD   4 mg at 08/27/22 1412   pantoprazole (PROTONIX) injection 40 mg  40 mg Intravenous Willette Pa, MD   40 mg at  08/27/22 0947   traMADol (ULTRAM) tablet 50 mg  50 mg Oral Q6H PRN Enedina Finner, MD         Abtx:  Anti-infectives (From admission, onward)    Start     Dose/Rate Route Frequency Ordered Stop   08/27/22 2200  azithromycin (ZITHROMAX) 500 mg in sodium chloride 0.9 % 250 mL IVPB        500 mg 250 mL/hr over 60 Minutes Intravenous Every 24 hours 08/27/22 0846     08/27/22 0800  cefTRIAXone (ROCEPHIN) 2 g in sodium chloride 0.9 % 100 mL IVPB        2 g 200 mL/hr over 30 Minutes Intravenous Every 24 hours 08/27/22 0747     08/26/22 2100  azithromycin (ZITHROMAX) 500 mg in sodium chloride 0.9 % 250 mL IVPB  Status:  Discontinued        500 mg 250 mL/hr over 60 Minutes Intravenous Every 24 hours 08/26/22 1836 08/27/22 0747   08/26/22 2000  cefTRIAXone (ROCEPHIN) 1 g in sodium chloride 0.9 % 100 mL IVPB  Status:  Discontinued        1 g 200 mL/hr over 30 Minutes Intravenous Every 24 hours 08/26/22 1836 08/27/22 0747       REVIEW OF SYSTEMS:  Const:  fever,  chills, not feeling well Eyes: negative diplopia or visual changes, negative eye pain ENT: negative coryza, negative sore throat Resp: +cough, no hemoptysis, has dyspnea Cards: negative for chest pain, palpitations, lower extremity edema GU:  negative for frequency, dysuria and hematuria GI: + abdominal pain, +diarrhea, and vomting Skin: says she has a rash on her back as it is feeling warm and she had a similar rash a few months ago Heme: negative for easy bruising and gum/nose bleeding MS: weakness Has low back pain since coming to the hosptial as she has bene in bed the whole time Neurolo:negative for headaches, dizziness, vertigo, memory problems  Psych:  anxiety,  Endocrine:  diabetes Started jardiance last week Allergy/Immunology- PCN ( has tolerated ceftriaxone and cefdinir in the past) Objective:  VITALS:  BP 122/62 (BP Location: Right Arm)   Pulse 74   Temp 98.2 F (36.8 C)   Resp 16   Ht 5\' 4"  (1.626 m)   Wt 60 kg    SpO2 100%   BMI 22.71 kg/m   General: Alert, cooperative, frail, oriented X5 Head: Normocephalic, without obvious abnormality, atraumatic. Eyes: Conjunctivae clear, anicteric sclerae. Pupils are equal ENT Nares normal. No drainage or sinus tenderness. Lips, mucosa, and tongue normal. No Thrush Neck: Supple, symmetrical, no adenopathy, thyroid: non tender no carotid bruit and no JVD. Back: erythematous maculopapular rash on the rt side of the back       Lungs: b/l air entry Heart: tachycardia Abdomen: Soft, minimal tenderness ,not distended. Bowel sounds normal. No masses Extremities: atraumatic, no cyanosis. No edema. No clubbing Skin: No rashes or lesions. Or bruising Lymph: Cervical, supraclavicular normal. Neurologic: Grossly non-focal Pertinent Labs Lab Results CBC    Component Value Date/Time   WBC 13.3 (H) 08/27/2022 0742   RBC 3.40 (L) 08/27/2022 0742   HGB 10.3 (L) 08/27/2022 0742   HCT 31.5 (L) 08/27/2022 0742   PLT 154 08/27/2022 0742   MCV 92.6 08/27/2022 0742   MCH 30.3 08/27/2022 0742   MCHC 32.7 08/27/2022 0742   RDW 14.4 08/27/2022 0742   LYMPHSABS 0.9 08/26/2022 0526   MONOABS 0.1 08/26/2022 0526   EOSABS 0.1 08/26/2022 0526   BASOSABS 0.0 08/26/2022 0526       Latest Ref Rng & Units 08/27/2022   12:57 PM 08/27/2022    4:26 AM 08/26/2022    6:35 PM  CMP  Glucose 70 - 99 mg/dL  90  161   BUN 8 - 23 mg/dL  22  18   Creatinine 0.96 - 1.00 mg/dL  0.45  4.09   Sodium 811 - 145 mmol/L  137  136   Potassium 3.5 - 5.1 mmol/L 3.0  2.9  3.4   Chloride 98 - 111 mmol/L  108  106   CO2 22 - 32 mmol/L  20  17   Calcium 8.9 - 10.3 mg/dL  6.1  6.5       Microbiology: Recent Results (from the past 240 hour(s))  Resp panel by RT-PCR (RSV, Flu A&B, Covid) Anterior Nasal Swab     Status: None   Collection Time: 08/22/22 11:32 AM   Specimen: Anterior Nasal Swab  Result Value Ref Range Status   SARS Coronavirus 2 by RT PCR NEGATIVE NEGATIVE Final     Comment: (NOTE) SARS-CoV-2 target nucleic acids are NOT DETECTED.  The SARS-CoV-2 RNA is generally detectable in upper respiratory specimens during the acute phase of infection. The lowest concentration of SARS-CoV-2 viral copies this assay can detect is 138 copies/mL. A negative result does not preclude SARS-Cov-2 infection and should not be used as the sole basis for treatment or other patient management decisions. A negative result may occur with  improper specimen collection/handling,  submission of specimen other than nasopharyngeal swab, presence of viral mutation(s) within the areas targeted by this assay, and inadequate number of viral copies(<138 copies/mL). A negative result must be combined with clinical observations, patient history, and epidemiological information. The expected result is Negative.  Fact Sheet for Patients:  BloggerCourse.com  Fact Sheet for Healthcare Providers:  SeriousBroker.it  This test is no t yet approved or cleared by the [place] FDA and  has been authorized for detection and/or diagnosis of SARS-CoV-2 by FDA under an Emergency Use Authorization (EUA). This EUA will remain  in effect (meaning this test can be used) for the duration of the COVID-19 declaration under Section 564(b)(1) of the Act, 21 U.S.C.section 360bbb-3(b)(1), unless the authorization is terminated  or revoked sooner.       Influenza A by PCR NEGATIVE NEGATIVE Final   Influenza B by PCR NEGATIVE NEGATIVE Final    Comment: (NOTE) The Xpert Xpress SARS-CoV-2/FLU/RSV plus assay is intended as an aid in the diagnosis of influenza from Nasopharyngeal swab specimens and should not be used as a sole basis for treatment. Nasal washings and aspirates are unacceptable for Xpert Xpress SARS-CoV-2/FLU/RSV testing.  Fact Sheet for Patients: BloggerCourse.com  Fact Sheet for Healthcare  Providers: SeriousBroker.it  This test is not yet approved or cleared by the [place] FDA and has been authorized for detection and/or diagnosis of SARS-CoV-2 by FDA under an Emergency Use Authorization (EUA). This EUA will remain in effect (meaning this test can be used) for the duration of the COVID-19 declaration under Section 564(b)(1) of the Act, 21 U.S.C. section 360bbb-3(b)(1), unless the authorization is terminated or revoked.     Resp Syncytial Virus by PCR NEGATIVE NEGATIVE Final    Comment: (NOTE) Fact Sheet for Patients: BloggerCourse.com  Fact Sheet for Healthcare Providers: SeriousBroker.it  This test is not yet approved or cleared by the [place] FDA and has been authorized for detection and/or diagnosis of SARS-CoV-2 by FDA under an Emergency Use Authorization (EUA). This EUA will remain in effect (meaning this test can be used) for the duration of the COVID-19 declaration under Section 564(b)(1) of the Act, 21 U.S.C. section 360bbb-3(b)(1), unless the authorization is terminated or revoked.  Performed at [facility], [address] [place], [place] [address]   Culture, blood (Routine X 2) w Reflex to ID Panel     Status: None (Preliminary result)   Collection Time: 08/26/22  6:35 PM   Specimen: BLOOD  Result Value Ref Range Status   Specimen Description BLOOD BLOOD RIGHT FOREARM  Final   Special Requests   Final    BOTTLES DRAWN AEROBIC AND ANAEROBIC Blood Culture adequate volume   Culture  Setup Time   Final    GRAM POSITIVE COCCI IN BOTH AEROBIC AND ANAEROBIC BOTTLES Organism ID to follow CRITICAL RESULT CALLED TO, READ BACK BY AND VERIFIED WITH: NATHAN BLUE@0640  08/26/21 RH Performed at [facility], [address] [place], [place] [address]    Culture GRAM POSITIVE COCCI  Final   Report Status PENDING  Incomplete  Culture, blood (Routine X 2) w  Reflex to ID Panel     Status: None (Preliminary result)   Collection Time: 08/26/22  6:35 PM   Specimen: BLOOD  Result Value Ref Range Status   Specimen Description BLOOD BLOOD LEFT HAND  Final   Special Requests   Final    BOTTLES DRAWN AEROBIC AND ANAEROBIC Blood Culture adequate volume   Culture  Setup Time  Final    GRAM POSITIVE COCCI IN BOTH AEROBIC AND ANAEROBIC BOTTLES CRITICAL VALUE NOTED.  VALUE IS CONSISTENT WITH PREVIOUSLY REPORTED AND CALLED VALUE. Performed at [facility], [address] [place], [place] [address]    Culture [facility] POSITIVE COCCI  Final   Report Status PENDING  Incomplete  Blood Culture ID Panel (Reflexed)     Status: Abnormal   Collection Time: 08/26/22  6:35 PM  Result Value Ref Range Status   Enterococcus faecalis NOT DETECTED NOT DETECTED Final   Enterococcus Faecium NOT DETECTED NOT DETECTED Final   Listeria monocytogenes NOT DETECTED NOT DETECTED Final   Staphylococcus species NOT DETECTED NOT DETECTED Final   Staphylococcus aureus (BCID) NOT DETECTED NOT DETECTED Final   Staphylococcus epidermidis NOT DETECTED NOT DETECTED Final   Staphylococcus lugdunensis NOT DETECTED NOT DETECTED Final   Streptococcus species DETECTED (A) NOT DETECTED Final    Comment: Not Enterococcus species, Streptococcus agalactiae, Streptococcus pyogenes, or Streptococcus pneumoniae. CRITICAL RESULT CALLED TO, READ BACK BY AND VERIFIED WITH: NATHAN BLUE@0640  08/27/22 RH    Streptococcus agalactiae NOT DETECTED NOT DETECTED Final   Streptococcus pneumoniae NOT DETECTED NOT DETECTED Final   Streptococcus pyogenes NOT DETECTED NOT DETECTED Final   A.calcoaceticus-baumannii NOT DETECTED NOT DETECTED Final   Bacteroides fragilis NOT DETECTED NOT DETECTED Final   Enterobacterales NOT DETECTED NOT DETECTED Final   Enterobacter cloacae complex NOT DETECTED NOT DETECTED Final   Escherichia coli NOT DETECTED NOT DETECTED Final   Klebsiella aerogenes NOT DETECTED NOT  DETECTED Final   Klebsiella oxytoca NOT DETECTED NOT DETECTED Final   Klebsiella pneumoniae NOT DETECTED NOT DETECTED Final   Proteus species NOT DETECTED NOT DETECTED Final   Salmonella species NOT DETECTED NOT DETECTED Final   Serratia marcescens NOT DETECTED NOT DETECTED Final   Haemophilus influenzae NOT DETECTED NOT DETECTED Final   Neisseria meningitidis NOT DETECTED NOT DETECTED Final   Pseudomonas aeruginosa NOT DETECTED NOT DETECTED Final   Stenotrophomonas maltophilia NOT DETECTED NOT DETECTED Final   Candida albicans NOT DETECTED NOT DETECTED Final   Candida auris NOT DETECTED NOT DETECTED Final   Candida glabrata NOT DETECTED NOT DETECTED Final   Candida krusei NOT DETECTED NOT DETECTED Final   Candida parapsilosis NOT DETECTED NOT DETECTED Final   Candida tropicalis NOT DETECTED NOT DETECTED Final   Cryptococcus neoformans/gattii NOT DETECTED NOT DETECTED Final    Comment: Performed at [facility], [address] [place], [place] [address]  C Difficile Quick Screen w PCR reflex     Status: None   Collection Time: 08/27/22  5:05 AM   Specimen: STOOL  Result Value Ref Range Status   C Diff antigen NEGATIVE NEGATIVE Final   C Diff toxin NEGATIVE NEGATIVE Final   C Diff interpretation No C. difficile detected.  Final    Comment: Performed at [facility], [address] [place], [place] [address]  Gastrointestinal Panel by PCR , Stool     Status: Abnormal   Collection Time: 08/27/22  5:05 AM   Specimen: STOOL  Result Value Ref Range Status   Campylobacter species NOT DETECTED NOT DETECTED Final   Plesimonas shigelloides NOT DETECTED NOT DETECTED Final   Salmonella species NOT DETECTED NOT DETECTED Final   Yersinia enterocolitica NOT DETECTED NOT DETECTED Final   Vibrio species NOT DETECTED NOT DETECTED Final   Vibrio cholerae NOT DETECTED NOT DETECTED Final   Enteroaggregative E coli (EAEC) NOT DETECTED NOT DETECTED Final   Enteropathogenic E coli  (  EPEC) NOT DETECTED NOT DETECTED Final   Enterotoxigenic E coli (ETEC) NOT DETECTED NOT DETECTED Final   Shiga like toxin producing E coli (STEC) NOT DETECTED NOT DETECTED Final   Shigella/Enteroinvasive E coli (EIEC) NOT DETECTED NOT DETECTED Final   Cryptosporidium NOT DETECTED NOT DETECTED Final   Cyclospora cayetanensis NOT DETECTED NOT DETECTED Final   Entamoeba histolytica NOT DETECTED NOT DETECTED Final   Giardia lamblia NOT DETECTED NOT DETECTED Final   Adenovirus F40/41 NOT DETECTED NOT DETECTED Final   Astrovirus NOT DETECTED NOT DETECTED Final   Norovirus GI/GII NOT DETECTED NOT DETECTED Final   Rotavirus A NOT DETECTED NOT DETECTED Final   Sapovirus (I, II, IV, and V) DETECTED (A) NOT DETECTED Final    Comment: Performed at [facility], [address] [place], [place] [address]    IMAGING RESULTS: CTA on 4/13 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  [facility]- MR Rt AP increased   Impression/Recommendation ? 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  Dr.Comer is covering Friday- Sunday- available by phone for urgent issues? ___________________________________________________ Discussed with patient, and daughter in detail. Note:  This document was prepared using Dragon voice recognition software and may include  unintentional dictation errors.

## 2022-08-27 NOTE — Progress Notes (Signed)
ANTICOAGULATION CONSULT NOTE - Initial Consult  Pharmacy Consult for heparin Indication: NSTEMI  Allergies  Allergen Reactions   Penicillins Anaphylaxis   Drug Ingredient [Zinc]     Patient Measurements: Height: 5\' 4"  (162.6 cm) Weight: 60 kg (132 lb 4.4 oz) IBW/kg (Calculated) : 54.7 Heparin Dosing Weight: 60 kg  Vital Signs: Temp: 100.3 F (37.9 C) (04/18 0448) Temp Source: Rectal (04/18 0448) BP: 136/66 (04/18 0448) Pulse Rate: 77 (04/18 0745)  Labs: Recent Labs    08/26/22 0526 08/26/22 0754 08/26/22 1835 08/27/22 0426 08/27/22 0742  HGB 11.9* 10.8* 10.6* 10.1* 10.3*  HCT 35.1* 32.0* 31.4* 29.9* 31.5*  PLT 176 170 166 154 154  APTT 33  --   --   --   --   LABPROT 18.1*  --   --   --   --   INR 1.5*  --   --   --   --   CREATININE 1.34*  --  1.48* 1.45*  --   TROPONINIHS 88* 856*  --  3,677*  --     Estimated Creatinine Clearance: 20.9 mL/min (A) (by C-G formula based on SCr of 1.45 mg/dL (H)).   Medical History: Past Medical History:  Diagnosis Date   Arthritis    Gout   Chronic kidney disease    Diarrhea    Hypercholesteremia    Hypertension    Neuropathy    feet and lower legs   Seasonal allergies    Skin cancer    face    Medications:  No evidence of PTA anticoagulation   Assessment: Pharmacy consulted to dose heparin in this 89 yo F who presents to the ED with Hematemesis.  Trops 856 > 3677.  Cardiology and GI both consulted, GI has cleared pt for receiving heparin gtt.  Baseline Labs: aPTT - 33; INR - 1.5 Hgb - 10.3; Plts - 154  Goal of Therapy:  Anti-xa 0.3-0.7 / aPTT 66-102s Monitor platelets by anticoagulation protocol: Yes  Date Time aPTT/HL Rate/Comment   Plan:  Give 3600 units bolus x1; then start heparin infusion at 700 units/hr Check anti-Xa level in 8 hours and daily once consecutively therapeutic. Continue to monitor H&H and platelets daily while on heparin gtt.  Manfred Shirts 08/27/2022,8:22 AM

## 2022-08-27 NOTE — Care Management Important Message (Signed)
Important Message  Patient Details  Name: Tina Curtis MRN: [id number] Date of Birth: 11-Sep-1926   Medicare Important Message Given:  N/A - LOS <3 / Initial given by admissions     Johnell Comings 08/27/2022, 4:26 PM

## 2022-08-27 NOTE — Progress Notes (Addendum)
[facility]  - [place] [facility]   PATIENT NAME: Tina Curtis    MR#:  [id number]  DATE OF BIRTH:  29-Dec-1926  SUBJECTIVE:  no family at bedside during my evaluation. Patient apparently came in with ongoing diarrhea for about a week and vomiting questionable hematemesis. Appears very fatigued and tired was not able to give much history. Had some shortness of breath. Complains of some cough. Denies abdominal pain. Seen by cardiology and G.I. Patient had fever of 103 yesterday.   VITALS:  Blood pressure (!) 120/57, pulse (!) 59, temperature 100.3 F (37.9 C), temperature source Rectal, resp. rate 18, height 5\' 4"  (1.626 m), weight 60 kg, SpO2 98 %.  PHYSICAL EXAMINATION:   GENERAL:  89 y.o.-year-old patient with no acute distress. Weak, deconditioned, critically ill LUNGS: decreased breath sounds bilaterally, no wheezing CARDIOVASCULAR: S1, S2 normal. No murmur  tachycardia ABDOMEN: Soft, nontender, nondistended. Bowel sounds present.  EXTREMITIES:trace  edema b/l.    NEUROLOGIC: nonfocal  patient is alert and awake SKIN erythematous rash on right arm, chest and flank  LABORATORY PANEL:  CBC Recent Labs  Lab 08/27/22 0742  WBC 13.3*  HGB 10.3*  HCT 31.5*  PLT 154    Chemistries  Recent Labs  Lab 08/26/22 0526 08/26/22 1835 08/27/22 0426 08/27/22 1257  NA 139   < > 137  --   K 2.5*   < > 2.9* 3.0*  CL 106   < > 108  --   CO2 21*   < > 20*  --   GLUCOSE 182*   < > 90  --   BUN 21   < > 22  --   CREATININE 1.34*   < > 1.45*  --   CALCIUM 7.3*   < > 6.1*  --   MG 1.4*  --  1.3*  --   AST 56*  --   --   --   ALT 35  --   --   --   ALKPHOS 72  --   --   --   BILITOT 1.1  --   --   --    < > = values in this interval not displayed.   Cardiac Enzymes No results for input(s): "TROPONINI" in the last 168 hours. RADIOLOGY:  ECHOCARDIOGRAM COMPLETE  Result Date: 08/27/2022    ECHOCARDIOGRAM REPORT   Patient Name:   Tina Curtis Date of Exam:  08/27/2022 Medical Rec #:  [id number]     Height:       64.0 in Accession #:    [id number]    Weight:       132.3 lb Date of Birth:  [birth year]    BSA:          1.641 m Patient Age:    89 years      BP:           136/66 mmHg Patient Gender: F             HR:           67 bpm. Exam Location:  [facility] Procedure: 2D Echo, Cardiac Doppler and Color Doppler Indications:     Elevated Troponin  History:         Patient has no prior history of Echocardiogram examinations.                  CHF, Acute MI, Signs/Symptoms:Chest Pain,  Dizziness/Lightheadedness and Fatigue; Risk                  Factors:Hypertension and Diabetes. Breast CA.  Sonographer:     Mikki Harbor Referring Phys:  [id number] [facility] MICHELLE TANG Diagnosing Phys: Marcina Millard MD IMPRESSIONS  1. Left ventricular ejection fraction, by estimation, is 60 to 65%. The left ventricle has normal function. The left ventricle has no regional wall motion abnormalities. Left ventricular diastolic parameters are consistent with Grade I diastolic dysfunction (impaired relaxation).  2. Right ventricular systolic function is normal. The right ventricular size is normal. There is moderately elevated pulmonary artery systolic pressure.  3. Left atrial size was mild to moderately dilated.  4. Right atrial size was mild to moderately dilated.  5. The mitral valve is normal in structure. Moderate to severe mitral valve regurgitation. No evidence of mitral stenosis.  6. Tricuspid valve regurgitation is moderate to severe.  7. The aortic valve is normal in structure. Aortic valve regurgitation is mild. No aortic stenosis is present.  8. The inferior vena cava is normal in size with greater than 50% respiratory variability, suggesting right atrial pressure of 3 mmHg. FINDINGS  Left Ventricle: Left ventricular ejection fraction, by estimation, is 60 to 65%. The left ventricle has normal function. The left ventricle has no regional wall motion abnormalities. The  left ventricular internal cavity size was normal in size. There is  no left ventricular hypertrophy. Left ventricular diastolic parameters are consistent with Grade I diastolic dysfunction (impaired relaxation). Right Ventricle: The right ventricular size is normal. No increase in right ventricular wall thickness. Right ventricular systolic function is normal. There is moderately elevated pulmonary artery systolic pressure. The tricuspid regurgitant velocity is 3.60 m/s, and with an assumed right atrial pressure of 8 mmHg, the estimated right ventricular systolic pressure is 59.8 mmHg. Left Atrium: Left atrial size was mild to moderately dilated. Right Atrium: Right atrial size was mild to moderately dilated. Pericardium: There is no evidence of pericardial effusion. Mitral Valve: The mitral valve is normal in structure. Moderate to severe mitral valve regurgitation. No evidence of mitral valve stenosis. MV peak gradient, 4.8 mmHg. The mean mitral valve gradient is 1.0 mmHg. Tricuspid Valve: The tricuspid valve is normal in structure. Tricuspid valve regurgitation is moderate to severe. No evidence of tricuspid stenosis. Aortic Valve: The aortic valve is normal in structure. Aortic valve regurgitation is mild. No aortic stenosis is present. Aortic valve mean gradient measures 4.0 mmHg. Aortic valve peak gradient measures 10.1 mmHg. Aortic valve area, by VTI measures 2.75  cm. Pulmonic Valve: The pulmonic valve was normal in structure. Pulmonic valve regurgitation is not visualized. No evidence of pulmonic stenosis. Aorta: The aortic root is normal in size and structure. Venous: The inferior vena cava is normal in size with greater than 50% respiratory variability, suggesting right atrial pressure of 3 mmHg. IAS/Shunts: No atrial level shunt detected by color flow Doppler.  LEFT VENTRICLE PLAX 2D LVIDd:         4.50 cm   Diastology LVIDs:         2.80 cm   LV e' medial:    6.42 cm/s LV PW:         1.00 cm   LV E/e'  medial:  17.0 LV IVS:        1.30 cm   LV e' lateral:   8.70 cm/s LVOT diam:     2.10 cm   LV E/e' lateral: 12.5 LV SV:  86 LV SV Index:   52 LVOT Area:     3.46 cm  RIGHT VENTRICLE RV Basal diam:  3.65 cm RV Mid diam:    2.70 cm RV S prime:     8.92 cm/s TAPSE (M-mode): 2.0 cm LEFT ATRIUM              Index        RIGHT ATRIUM           Index LA diam:        4.10 cm  2.50 cm/m   RA Area:     19.20 cm LA Vol (A2C):   115.0 ml 70.07 ml/m  RA Volume:   50.40 ml  30.71 ml/m LA Vol (A4C):   61.7 ml  37.60 ml/m LA Biplane Vol: 85.5 ml  52.10 ml/m  AORTIC VALVE                    PULMONIC VALVE AV Area (Vmax):    2.03 cm     PV Vmax:       0.90 m/s AV Area (Vmean):   2.40 cm     PV Peak grad:  3.2 mmHg AV Area (VTI):     2.75 cm AV Vmax:           159.00 cm/s AV Vmean:          91.500 cm/s AV VTI:            0.311 m AV Peak Grad:      10.1 mmHg AV Mean Grad:      4.0 mmHg LVOT Vmax:         93.40 cm/s LVOT Vmean:        63.300 cm/s LVOT VTI:          0.247 m LVOT/AV VTI ratio: 0.79  AORTA Ao Root diam: 3.30 cm Ao Asc diam:  4.10 cm MITRAL VALVE                TRICUSPID VALVE MV Area (PHT): 4.65 cm     TR Peak grad:   51.8 mmHg MV Area VTI:   2.77 cm     TR Vmax:        360.00 cm/s MV Peak grad:  4.8 mmHg MV Mean grad:  1.0 mmHg     SHUNTS MV Vmax:       1.10 m/s     Systemic VTI:  0.25 m MV Vmean:      54.4 cm/s    Systemic Diam: 2.10 cm MV Decel Time: 163 msec MV E velocity: 109.00 cm/s MV A velocity: 77.00 cm/s MV E/A ratio:  1.42 Marcina Millard MD Electronically signed by Marcina Millard MD Signature Date/Time: 08/27/2022/1:16:52 PM    Final    US Venous Img Lower Bilateral (DVT)  Result Date: 08/26/2022 CLINICAL DATA:  [id number] Positive D dimer [id number]. EXAM: BILATERAL LOWER EXTREMITY VENOUS DOPPLER ULTRASOUND TECHNIQUE: Gray-scale sonography with compression, as well as color and duplex ultrasound, were performed to evaluate the deep venous system(s) from the level of the common femoral  vein through the popliteal and proximal calf veins. COMPARISON:  None Available. FINDINGS: VENOUS Normal compressibility of the common femoral, superficial femoral, and popliteal veins, as well as the visualized calf veins. Visualized portions of profunda femoral vein and great saphenous vein unremarkable. No filling defects to suggest DVT on grayscale or color Doppler imaging. Doppler waveforms show normal direction of venous flow, normal respiratory plasticity and response to  augmentation. OTHER Cyst in the left popliteal fossa measuring up to 5 cm, likely Baker's cyst. Limitations: none IMPRESSION: 1. No lower extremity DVT. 2. 5 cm left Baker's cyst. Electronically Signed   By: Orvan Falconer M.D.   On: 08/26/2022 15:45   NM Pulmonary Perfusion  Result Date: 08/26/2022 CLINICAL DATA:  Pulmonary embolus suspected EXAM: NUCLEAR MEDICINE PERFUSION LUNG SCAN TECHNIQUE: Perfusion images were obtained in multiple projections after intravenous injection of radiopharmaceutical. Ventilation scans intentionally deferred if perfusion scan and chest x-ray adequate for interpretation during COVID 19 epidemic. RADIOPHARMACEUTICALS:  4.2 mCi Tc-32m MAA IV COMPARISON:  None Available. FINDINGS: Bilateral mild-to-moderate perfusion defects. No large perfusion defects. IMPRESSION: Nondiagnostic (intermediate probability). Correlate with lower extremity ultrasound and consider chest CTA for further evaluation. Electronically Signed   By: Allegra Lai M.D.   On: 08/26/2022 15:10   DG Chest Port 1 View  Result Date: 08/26/2022 CLINICAL DATA:  89 year old female with history of hematemesis and generalized weakness. EXAM: PORTABLE CHEST 1 VIEW COMPARISON:  Chest x-ray 06/07/2022. FINDINGS: Lung volumes are low. Bibasilar opacities favored to reflect areas of subsegmental atelectasis. Diffuse interstitial prominence and widespread peribronchial cuffing. No definite pleural effusions. No pneumothorax. No evidence of  pulmonary edema. Heart size is normal. Upper mediastinal contours are within normal limits allowing for patient positioning. Atherosclerotic calcifications in the thoracic aorta. IMPRESSION: 1. The appearance of the chest is concerning for probable acute bronchitis, as above. Some degree of underlying interstitial lung disease is also suspected. 2. Aortic atherosclerosis. Electronically Signed   By: Trudie Reed M.D.   On: 08/26/2022 05:51    Assessment and Plan  DAMON BAISCH is a 89 y.o. female with medical history significant of hypertension, hyperlipidemia, dCHF, gout, depression, CKD-3B, neuropathy, C. difficile, breast cancer, who presents with hematemesis, nausea, vomiting, diarrhea and chest pain.  4/13, and had CTA of chest/abdomen/pelvis and was diagnosed with gastroenteritis.   Patient is normally not using oxygen, but was found to have oxygen desaturation to 87-89% on room air, which improved to 90-92% on 2 L oxygen. Of note, pt has rash in her left chest wall and left upper arm, not itchy or painful.   CXR: 1. The appearance of the chest is concerning for probable acute bronchitis, as above. Some degree of underlying interstitial lung disease is also suspected. 2. Aortic atherosclerosis  US venous doppler 1. No lower extremity DVT. 2. 5 cm left Baker's cyst.  NM perfusion study: Indetermidate  Sepsis due to Acute bronchopneumonia Streptococcal Bacterimia --IV rocephin + zithormax --ID consultation with Dr Rivka Safer -- patient came in with fever of 103, tachycardia, tachypnea elevated white count and hypotension, chest x-ray suggestive bronchopneumonia --BC 4/4 positive for strept species  NSTEMI --pt has no underlying cardiac hisotry (per records, family) --had some cp troponin 88-- [phone] --Cardiology consult with dr Cassie Freer. Started on IV  heparin after discussing with GI Dr Norma Fredrickson --Echo shows EF 60-70%  with mod to severe MR,TR, mild AR --cont statins,  Imdur,toprol XL -- Cardiologist reviewed EKG, they did not think patient has A-fib.   Acute diastolic CHF Valvular regurgitation --IV lasix 20 mg bid  --monitor creatinine --input/output  Viral GE --GI PCR stool positive for Sapovirus --treat symptoms  Hypokalemia/hypomagnesimia --Pharmacy to replete electrolytes  Vomiting/hematemesis --serial hgbs table--Seen by GI dr Norma Fredrickson --IV ppi bid --no luminal eval at present and ok to use IV heparin  Type II diabetes mellitus with renal manifestations CKD IIIB: Recent A1c 9.0.  Poorly controlled.  Patient  is taking Jardiance at home -Will hold Jardiance due to suspicious allergic reaction (rash) -SSI --pt follows with Dr Wynelle Link as out pt --monitor creat   Anemia of chronic renal failure, stage 3b: Hgb 11.9 (12.4 on 08/22/22) -f/u by CBC   Gout -Allopurinol   Depression:  -Continue home medications   Rash: Etiology is not clear.  May be due to newly started Jardiance -Hold Jardiance    Procedures:none Family communication :dter [name] on the phone Consults :cardiology, ID, GI CODE STATUS: DNR/DNI DVT Prophylaxis :Heparin gtt Level of care: Progressive Status is: Inpatient Remains inpatient appropriate because: sepsis, NSTEMI, CHF, GE    TOTAL Critical TIME TAKING CARE OF THIS PATIENT: 45 minutes.  >50% time spent on counselling and coordination of care  Note: This dictation was prepared with Dragon dictation along with smaller phrase technology. Any transcriptional errors that result from this process are unintentional.  Enedina Finner M.D    [facility]   CC: Primary care physician; [name], MD

## 2022-08-27 NOTE — Progress Notes (Signed)
PHARMACY - PHYSICIAN COMMUNICATION CRITICAL VALUE ALERT - BLOOD CULTURE IDENTIFICATION (BCID)  Tina Curtis is an 89 y.o. female who presented to [facility] on 08/26/2022 with a chief complaint of Hematemesis.  Assessment:  4 of 4 bottles pos for Strep spp, no resistance detected.  Name of physician (or Provider) Contacted: Enedina Finner, MD  Current antibiotics: Ceftriaxone 1 gm IV Q24H, Azithromycin 500 mg IV Q24H  Changes to prescribed antibiotics recommended:  Recommended increasing ceftriaxone to 2 gm IV Q24H and discontinuing azithromycin.  Results for orders placed or performed during the hospital encounter of 08/26/22  Blood Culture ID Panel (Reflexed) (Collected: 08/26/2022  6:35 PM)  Result Value Ref Range   Enterococcus faecalis NOT DETECTED NOT DETECTED   Enterococcus Faecium NOT DETECTED NOT DETECTED   Listeria monocytogenes NOT DETECTED NOT DETECTED   Staphylococcus species NOT DETECTED NOT DETECTED   Staphylococcus aureus (BCID) NOT DETECTED NOT DETECTED   Staphylococcus epidermidis NOT DETECTED NOT DETECTED   Staphylococcus lugdunensis NOT DETECTED NOT DETECTED   Streptococcus species DETECTED (A) NOT DETECTED   Streptococcus agalactiae NOT DETECTED NOT DETECTED   Streptococcus pneumoniae NOT DETECTED NOT DETECTED   Streptococcus pyogenes NOT DETECTED NOT DETECTED   A.calcoaceticus-baumannii NOT DETECTED NOT DETECTED   Bacteroides fragilis NOT DETECTED NOT DETECTED   Enterobacterales NOT DETECTED NOT DETECTED   Enterobacter cloacae complex NOT DETECTED NOT DETECTED   Escherichia coli NOT DETECTED NOT DETECTED   Klebsiella aerogenes NOT DETECTED NOT DETECTED   Klebsiella oxytoca NOT DETECTED NOT DETECTED   Klebsiella pneumoniae NOT DETECTED NOT DETECTED   Proteus species NOT DETECTED NOT DETECTED   Salmonella species NOT DETECTED NOT DETECTED   Serratia marcescens NOT DETECTED NOT DETECTED   Haemophilus influenzae NOT DETECTED NOT DETECTED   Neisseria meningitidis  NOT DETECTED NOT DETECTED   Pseudomonas aeruginosa NOT DETECTED NOT DETECTED   Stenotrophomonas maltophilia NOT DETECTED NOT DETECTED   Candida albicans NOT DETECTED NOT DETECTED   Candida auris NOT DETECTED NOT DETECTED   Candida glabrata NOT DETECTED NOT DETECTED   Candida krusei NOT DETECTED NOT DETECTED   Candida parapsilosis NOT DETECTED NOT DETECTED   Candida tropicalis NOT DETECTED NOT DETECTED   Cryptococcus neoformans/gattii NOT DETECTED NOT DETECTED    Manfred Shirts 08/27/2022  9:07 AM

## 2022-08-27 NOTE — Progress Notes (Signed)
*  PRELIMINARY RESULTS* Echocardiogram 2D Echocardiogram has been performed.  Tina Curtis 08/27/2022, 9:43 AM

## 2022-08-27 NOTE — ED Notes (Signed)
Pt cbg 96. Rn, Efraim Kaufmann was notified

## 2022-08-28 DIAGNOSIS — I214 Non-ST elevation (NSTEMI) myocardial infarction: Secondary | ICD-10-CM | POA: Diagnosis not present

## 2022-08-28 DIAGNOSIS — A409 Streptococcal sepsis, unspecified: Secondary | ICD-10-CM | POA: Diagnosis not present

## 2022-08-28 DIAGNOSIS — J189 Pneumonia, unspecified organism: Secondary | ICD-10-CM | POA: Diagnosis not present

## 2022-08-28 LAB — CBC
HCT: 29 % — ABNORMAL LOW (ref 36.0–46.0)
Hemoglobin: 10 g/dL — ABNORMAL LOW (ref 12.0–15.0)
MCH: 31.1 pg (ref 26.0–34.0)
MCHC: 34.5 g/dL (ref 30.0–36.0)
MCV: 90.1 fL (ref 80.0–100.0)
Platelets: 165 10*3/uL (ref 150–400)
RBC: 3.22 MIL/uL — ABNORMAL LOW (ref 3.87–5.11)
RDW: 14.4 % (ref 11.5–15.5)
WBC: 14.1 10*3/uL — ABNORMAL HIGH (ref 4.0–10.5)
nRBC: 0 % (ref 0.0–0.2)

## 2022-08-28 LAB — BASIC METABOLIC PANEL
Anion gap: 7 (ref 5–15)
BUN: 27 mg/dL — ABNORMAL HIGH (ref 8–23)
CO2: 18 mmol/L — ABNORMAL LOW (ref 22–32)
Calcium: 6.4 mg/dL — CL (ref 8.9–10.3)
Chloride: 107 mmol/L (ref 98–111)
Creatinine, Ser: 1.62 mg/dL — ABNORMAL HIGH (ref 0.44–1.00)
GFR, Estimated: 29 mL/min — ABNORMAL LOW (ref >60)
Glucose, Bld: 120 mg/dL — ABNORMAL HIGH (ref 70–99)
Potassium: 3.5 mmol/L (ref 3.5–5.1)
Sodium: 132 mmol/L — ABNORMAL LOW (ref 135–145)

## 2022-08-28 LAB — HEPARIN LEVEL (UNFRACTIONATED)
Heparin Unfractionated: 0.28 IU/mL — ABNORMAL LOW (ref 0.30–0.70)
Heparin Unfractionated: 0.45 IU/mL (ref 0.30–0.70)
Heparin Unfractionated: 0.39 IU/mL (ref 0.30–0.70)

## 2022-08-28 LAB — GLUCOSE, CAPILLARY
Glucose-Capillary: 119 mg/dL — ABNORMAL HIGH (ref 70–99)
Glucose-Capillary: 120 mg/dL — ABNORMAL HIGH (ref 70–99)
Glucose-Capillary: 113 mg/dL — ABNORMAL HIGH (ref 70–99)
Glucose-Capillary: 118 mg/dL — ABNORMAL HIGH (ref 70–99)

## 2022-08-28 LAB — CULTURE, BLOOD (ROUTINE X 2)

## 2022-08-28 LAB — ALBUMIN: Albumin: 2.7 g/dL — ABNORMAL LOW (ref 3.5–5.0)

## 2022-08-28 LAB — MAGNESIUM: Magnesium: 2.3 mg/dL (ref 1.7–2.4)

## 2022-08-28 LAB — PHOSPHORUS: Phosphorus: 1.4 mg/dL — ABNORMAL LOW (ref 2.5–4.6)

## 2022-08-28 MED ORDER — ASPIRIN 81 MG PO CHEW: 81.0000 mg | CHEWABLE_TABLET | Freq: Every day | ORAL | Status: DC

## 2022-08-28 MED ORDER — ASPIRIN 81 MG PO CHEW
81.0000 mg | CHEWABLE_TABLET | Freq: Every day | ORAL | Status: DC
  Administered 2022-08-28 – 2022-09-02 (×6): 81 mg via ORAL
  Filled 2022-08-28 (×6): qty 1

## 2022-08-28 MED ORDER — CLOPIDOGREL BISULFATE 75 MG PO TABS
75.0000 mg | ORAL_TABLET | Freq: Every day | ORAL | Status: DC
  Administered 2022-08-29 – 2022-09-02 (×5): 75 mg via ORAL
  Filled 2022-08-28 (×5): qty 1

## 2022-08-28 MED ORDER — CALCIUM GLUCONATE-NACL 1-0.675 GM/50ML-% IV SOLN
1.0000 g | Freq: Once | INTRAVENOUS | Status: AC
  Administered 2022-08-28: 1000 mg via INTRAVENOUS
  Filled 2022-08-28: qty 50

## 2022-08-28 MED ORDER — POTASSIUM PHOSPHATES 15 MMOLE/5ML IV SOLN
30.0000 mmol | Freq: Once | INTRAVENOUS | Status: AC
  Administered 2022-08-28: 30 mmol via INTRAVENOUS
  Filled 2022-08-28: qty 10

## 2022-08-28 NOTE — Progress Notes (Signed)
       CROSS COVER NOTE  NAME: Tina Curtis MRN: [id number] [name] : 07/27/1926    HPI/Events of Note   Report:calcium 6.4  On review of chart:no albumin level ordered Phos 1.4    Assessment and  Interventions   Assessment:  Plan: Albumin ordered - 2.4 corrected calcium 7.4 i gm calcium gluconate ordered 30 mmol k phos IV ordered       Donnie Mesa NP [facility]

## 2022-08-28 NOTE — Plan of Care (Signed)

## 2022-08-28 NOTE — Progress Notes (Signed)
[facility] CLINIC CARDIOLOGY CONSULT NOTE       Patient ID: Tina Curtis MRN: [id number] [name]/AGE: 10-09-1926 89 y.o.  Admit date: 08/26/2022 Referring Physician Dr. Lorretta Harp Primary Physician Dr. Larwance Sachs  Primary Cardiologist Dr. Lady Gary (seen once in 2019) Reason for Consultation possible atrial fibrillation  HPI: Tina Curtis is a 89yoF with a PMH of CKD 3, DM2, HFpEF (EF >55%, G1 DD 06/2022), renal artery stenosis s/p stenting, history of breast cancer who presented to [facility] ED 08/26/2022 from [facility] with reported vomiting and coffee-ground emesis for 2 week and weeks and right upper abdominal pain with initial concern for UGIB. Evaluated by GI, patient declined EGD. Hgb stable overnight. EKG demonstrates normal sinus rhythm without tele evidence of atrial fibrillation or other arrhythmias so far. The patient is being treated for streptococcus bacteremia and possible PNA. Stool pathogen panel positive for sapovirus. Troponin uptrending to current peak of 3677. Continuing medical management from a cardiac perspective.   Interval History:  - overall improvement today, able to eat dinner and a little breakfast without N/V.  - pleuritic chest discomfort improving. No shortness of breath, but remains with oxygen requirement - rash more confluent on back and itchy overnight  - afebrile overnight - echo with normal EF, mod-severe TR and MR  Review of systems complete and found to be negative unless listed above     Past Medical History:  Diagnosis Date   Arthritis    Gout   Chronic kidney disease    Diarrhea    Hypercholesteremia    Hypertension    Neuropathy    feet and lower legs   Seasonal allergies    Skin cancer    face    Past Surgical History:  Procedure Laterality Date   ABDOMINAL HYSTERECTOMY  2000   APPENDECTOMY     BREAST BIOPSY Right 09/19/2019   affirm bx of calcs UOQ, x marker, path pending   BREAST BIOPSY Right 09/19/2019   [place] bx of mass,heart marker, path  pending   BREAST BIOPSY Right 09/19/2019   [place] bx of LN, coil marker, path pending   CATARACT EXTRACTION Left    CATARACT EXTRACTION W/PHACO Right 10/24/2014   Procedure: CATARACT EXTRACTION PHACO AND INTRAOCULAR LENS PLACEMENT (IOC);  Surgeon: Lockie Mola, MD;  Location: [facility];  Service: Ophthalmology;  Laterality: Right;   ESOPHAGOGASTRODUODENOSCOPY N/A 03/07/2018   Procedure: ESOPHAGOGASTRODUODENOSCOPY (EGD);  Surgeon: Toney Reil, MD;  Location: [facility] ENDOSCOPY;  Service: Gastroenterology;  Laterality: N/A;   MASTECTOMY MODIFIED RADICAL Right 10/13/2019   Procedure: MASTECTOMY MODIFIED RADICAL;  Surgeon: Earline Mayotte, MD;  Location: [facility] ORS;  Service: General;  Laterality: Right;   RENAL ANGIOGRAPHY Right 04/11/2018   Procedure: RENAL ANGIOGRAPHY;  Surgeon: Annice Needy, MD;  Location: [facility] INVASIVE CV LAB;  Service: Cardiovascular;  Laterality: Right;   SIMPLE MASTECTOMY WITH AXILLARY SENTINEL NODE BIOPSY Left 10/13/2019   Procedure: SIMPLE MASTECTOMY TRUE CUT BIOPSY, SENTINEL NODE BIOPSY;  Surgeon: Earline Mayotte, MD;  Location: [facility] ORS;  Service: General;  Laterality: Left;    Medications Prior to Admission  Medication Sig Dispense Refill Last Dose   albuterol (VENTOLIN HFA) 108 (90 Base) MCG/ACT inhaler Inhale 2 puffs into the lungs every 6 (six) hours as needed for wheezing or shortness of breath. 8 g 0 unknown   allopurinol (ZYLOPRIM) 100 MG tablet Take 100 mg by mouth daily.   08/25/2022   anastrozole (ARIMIDEX) 1 MG tablet TAKE 1 TABLET(1 MG) BY MOUTH  DAILY. DO NOT. START IF RASH IS NOT BETTER (Patient taking differently: Take 1 mg by mouth daily.) 90 tablet 1 08/25/2022   aspirin 81 MG EC tablet Take 81 mg by mouth daily.   08/25/2022   citalopram (CELEXA) 10 MG tablet Take 10 mg by mouth daily.    08/25/2022   clopidogrel (PLAVIX) 75 MG tablet TAKE 1 TABLET BY MOUTH EVERY DAY (Patient taking differently: Take 75 mg by mouth daily.) 30 tablet 1  08/25/2022   cyanocobalamin (VITAMIN B12) 1000 MCG tablet Take 1,000 mcg by mouth daily.   08/25/2022   docusate sodium (COLACE) 100 MG capsule Take 100 mg by mouth daily as needed for mild constipation.   08/25/2022   DULoxetine (CYMBALTA) 60 MG capsule Take 60 mg by mouth daily.    08/25/2022   empagliflozin (JARDIANCE) 10 MG TABS tablet Take 10 mg by mouth daily.   08/25/2022   Ergocalciferol 50 MCG (2000 UT) TABS Take 2,000 Units by mouth daily.   08/25/2022   furosemide (LASIX) 40 MG tablet Take 1 tablet (40 mg total) by mouth daily. 30 tablet 0 08/25/2022   gabapentin (NEURONTIN) 300 MG capsule Take 300 mg by mouth at bedtime.   08/25/2022   hydrALAZINE (APRESOLINE) 25 MG tablet Take 25 mg by mouth 3 (three) times daily.   08/25/2022   loratadine (CLARITIN) 10 MG tablet Take 10 mg by mouth daily as needed for allergies. AM   unknown   metoprolol succinate (TOPROL-XL) 25 MG 24 hr tablet Take 37.5 mg by mouth at bedtime.    08/25/2022   [EXPIRED] ondansetron (ZOFRAN-ODT) 4 MG disintegrating tablet Take 1 tablet (4 mg total) by mouth every 8 (eight) hours as needed for up to 5 days. 15 tablet 0 unknown   simvastatin (ZOCOR) 20 MG tablet Take 20 mg by mouth every evening. PM   08/25/2022   triamcinolone cream (KENALOG) 0.1 % Apply 1 Application topically 2 (two) times daily.   unknown    Social History   Socioeconomic History   Marital status: Widowed    Spouse name: Not on file   Number of children: Not on file   Years of education: Not on file   Highest education level: Not on file  Occupational History   Not on file  Tobacco Use   Smoking status: Never   Smokeless tobacco: Never  Substance and Sexual Activity   Alcohol use: No   Drug use: Never   Sexual activity: Not on file  Other Topics Concern   Not on file  Social History Narrative   2 daughters; lives in [address]; Runner, broadcasting/film/video retd. 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    Social Determinants of Health   Financial  Resource Strain: Not on file  Food Insecurity: No Food Insecurity (08/27/2022)   Hunger Vital Sign    Worried About Running Out of Food in the Last Year: Never true    Ran Out of Food in the Last Year: Never true  Transportation Needs: No Transportation Needs (08/27/2022)   PRAPARE - Administrator, Civil Service (Medical): No    Lack of Transportation (Non-Medical): No  Physical Activity: Not on file  Stress: Not on file  Social Connections: Not on file  Intimate Partner Violence: Not At Risk (08/27/2022)   Humiliation, Afraid, Rape, and Kick questionnaire    Fear of Current or Ex-Partner: No    Emotionally Abused: No    Physically Abused: No    Sexually Abused:  No    Family History  Problem Relation Age of Onset   Hypertension Mother    Hypertension Father    Stroke Father    Breast cancer Daughter 4      Intake/Output Summary (Last 24 hours) at 08/28/2022 0912 Last data filed at 08/28/2022 0603 Gross per 24 hour  Intake 1929.3 ml  Output --  Net 1929.3 ml     Vitals:   08/27/22 2002 08/28/22 0004 08/28/22 0407 08/28/22 0500  BP: 122/62 (!) 140/61 133/64   Pulse: 74 79 72   Resp: 16 18 16    Temp: 98.2 F (36.8 C) 98.8 F (37.1 C) 98.3 F (36.8 C)   TempSrc:      SpO2: 100% 95% 96%   Weight:    65.8 kg  Height:        PHYSICAL EXAM General: elderly ill appearing caucasian female, laying at low incline with daughter at bedside) HEENT:  Normocephalic and atraumatic. Neck:  No JVD.  Chest: erythema fading from chest Back: erythema extending across entirety of back and across right flank  Lungs: Normal respiratory effort on 2L by [place]. Decreased breath sounds with crackles right base Heart: HRRR . Normal S1 and S2 without gallops or murmurs.  Abdomen: Non-distended appearing.  Msk: Normal strength and tone for age. Extremities: Warm and well perfused. No clubbing, cyanosis. No peripheral edema.  Neuro: Alert and oriented X 3. Psych:  Answers questions  appropriately.   Labs: Basic Metabolic Panel: Recent Labs    08/26/22 1835 08/27/22 0426 08/27/22 1257 08/28/22 0214  NA 136 137  --  132*  K 3.4* 2.9* 3.0* 3.5  CL 106 108  --  107  CO2 17* 20*  --  18*  GLUCOSE 157* 90  --  120*  BUN 18 22  --  27*  CREATININE 1.48* 1.45*  --  1.62*  CALCIUM 6.5* 6.1*  --  6.4*  MG  --  1.3*  --  2.3  PHOS 4.8*  --   --  1.4*    Liver Function Tests: Recent Labs    08/26/22 0526 08/28/22 0214  AST 56*  --   ALT 35  --   ALKPHOS 72  --   BILITOT 1.1  --   PROT 7.4  --   ALBUMIN 3.9 2.7*    Recent Labs    08/26/22 0526  LIPASE 26    CBC: Recent Labs    08/26/22 0526 08/26/22 0754 08/27/22 0742 08/28/22 0214  WBC 6.4   < > 13.3* 14.1*  NEUTROABS 5.3  --   --   --   HGB 11.9*   < > 10.3* 10.0*  HCT 35.1*   < > 31.5* 29.0*  MCV 92.1   < > 92.6 90.1  PLT 176   < > 154 165   < > = values in this interval not displayed.    Cardiac Enzymes: Recent Labs    08/27/22 1019 08/27/22 1257 08/27/22 1724  TROPONINIHS 3,299* 2,667* 2,308*    BNP: Recent Labs    08/26/22 0526 08/27/22 0742  BNP 621.0* 1,712.6*    D-Dimer: Recent Labs    08/26/22 1200  DDIMER 1.78*    Hemoglobin A1C: Recent Labs    08/26/22 0754  HGBA1C 7.0*    Fasting Lipid Panel: Recent Labs    08/27/22 0426  CHOL 75  HDL 22*  LDLCALC 34  TRIG 97  CHOLHDL 3.4    Thyroid Function Tests: No results  for input(s): "TSH", "T4TOTAL", "T3FREE", "THYROIDAB" in the last 72 hours.  Invalid input(s): "FREET3" Anemia Panel: No results for input(s): "VITAMINB12", "FOLATE", "FERRITIN", "TIBC", "IRON", "RETICCTPCT" in the last 72 hours.   Radiology: ECHOCARDIOGRAM COMPLETE  Result Date: 08/27/2022    ECHOCARDIOGRAM REPORT   Patient Name:   TEHILLAH CIPRIANI Date of Exam: 08/27/2022 Medical Rec #:  [id number]     Height:       64.0 in Accession #:    [id number]    Weight:       132.3 lb Date of Birth:  1926/06/01    BSA:          1.641 m Patient  Age:    89 years      BP:           136/66 mmHg Patient Gender: F             HR:           67 bpm. Exam Location:  [facility] Procedure: 2D Echo, Cardiac Doppler and Color Doppler Indications:     Elevated Troponin  History:         Patient has no prior history of Echocardiogram examinations.                  CHF, Acute MI, Signs/Symptoms:Chest Pain,                  Dizziness/Lightheadedness and Fatigue; Risk                  Factors:Hypertension and Diabetes. Breast CA.  Sonographer:     Mikki Harbor Referring Phys:  [id number] [facility] MICHELLE Ethridge Sollenberger Diagnosing Phys: Marcina Millard MD IMPRESSIONS  1. Left ventricular ejection fraction, by estimation, is 60 to 65%. The left ventricle has normal function. The left ventricle has no regional wall motion abnormalities. Left ventricular diastolic parameters are consistent with Grade I diastolic dysfunction (impaired relaxation).  2. Right ventricular systolic function is normal. The right ventricular size is normal. There is moderately elevated pulmonary artery systolic pressure.  3. Left atrial size was mild to moderately dilated.  4. Right atrial size was mild to moderately dilated.  5. The mitral valve is normal in structure. Moderate to severe mitral valve regurgitation. No evidence of mitral stenosis.  6. Tricuspid valve regurgitation is moderate to severe.  7. The aortic valve is normal in structure. Aortic valve regurgitation is mild. No aortic stenosis is present.  8. The inferior vena cava is normal in size with greater than 50% respiratory variability, suggesting right atrial pressure of 3 mmHg. FINDINGS  Left Ventricle: Left ventricular ejection fraction, by estimation, is 60 to 65%. The left ventricle has normal function. The left ventricle has no regional wall motion abnormalities. The left ventricular internal cavity size was normal in size. There is  no left ventricular hypertrophy. Left ventricular diastolic parameters are consistent with Grade I diastolic  dysfunction (impaired relaxation). Right Ventricle: The right ventricular size is normal. No increase in right ventricular wall thickness. Right ventricular systolic function is normal. There is moderately elevated pulmonary artery systolic pressure. The tricuspid regurgitant velocity is 3.60 m/s, and with an assumed right atrial pressure of 8 mmHg, the estimated right ventricular systolic pressure is 59.8 mmHg. Left Atrium: Left atrial size was mild to moderately dilated. Right Atrium: Right atrial size was mild to moderately dilated. Pericardium: There is no evidence of pericardial effusion. Mitral Valve: The mitral valve is normal in structure. Moderate to severe  mitral valve regurgitation. No evidence of mitral valve stenosis. MV peak gradient, 4.8 mmHg. The mean mitral valve gradient is 1.0 mmHg. Tricuspid Valve: The tricuspid valve is normal in structure. Tricuspid valve regurgitation is moderate to severe. No evidence of tricuspid stenosis. Aortic Valve: The aortic valve is normal in structure. Aortic valve regurgitation is mild. No aortic stenosis is present. Aortic valve mean gradient measures 4.0 mmHg. Aortic valve peak gradient measures 10.1 mmHg. Aortic valve area, by VTI measures 2.75  cm. Pulmonic Valve: The pulmonic valve was normal in structure. Pulmonic valve regurgitation is not visualized. No evidence of pulmonic stenosis. Aorta: The aortic root is normal in size and structure. Venous: The inferior vena cava is normal in size with greater than 50% respiratory variability, suggesting right atrial pressure of 3 mmHg. IAS/Shunts: No atrial level shunt detected by color flow Doppler.  LEFT VENTRICLE PLAX 2D LVIDd:         4.50 cm   Diastology LVIDs:         2.80 cm   LV e' medial:    6.42 cm/s LV PW:         1.00 cm   LV E/e' medial:  17.0 LV IVS:        1.30 cm   LV e' lateral:   8.70 cm/s LVOT diam:     2.10 cm   LV E/e' lateral: 12.5 LV SV:         86 LV SV Index:   52 LVOT Area:     3.46 cm   RIGHT VENTRICLE RV Basal diam:  3.65 cm RV Mid diam:    2.70 cm RV S prime:     8.92 cm/s TAPSE (M-mode): 2.0 cm LEFT ATRIUM              Index        RIGHT ATRIUM           Index LA diam:        4.10 cm  2.50 cm/m   RA Area:     19.20 cm LA Vol (A2C):   115.0 ml 70.07 ml/m  RA Volume:   50.40 ml  30.71 ml/m LA Vol (A4C):   61.7 ml  37.60 ml/m LA Biplane Vol: 85.5 ml  52.10 ml/m  AORTIC VALVE                    PULMONIC VALVE AV Area (Vmax):    2.03 cm     PV Vmax:       0.90 m/s AV Area (Vmean):   2.40 cm     PV Peak grad:  3.2 mmHg AV Area (VTI):     2.75 cm AV Vmax:           159.00 cm/s AV Vmean:          91.500 cm/s AV VTI:            0.311 m AV Peak Grad:      10.1 mmHg AV Mean Grad:      4.0 mmHg LVOT Vmax:         93.40 cm/s LVOT Vmean:        63.300 cm/s LVOT VTI:          0.247 m LVOT/AV VTI ratio: 0.79  AORTA Ao Root diam: 3.30 cm Ao Asc diam:  4.10 cm MITRAL VALVE                TRICUSPID VALVE MV  Area (PHT): 4.65 cm     TR Peak grad:   51.8 mmHg MV Area VTI:   2.77 cm     TR Vmax:        360.00 cm/s MV Peak grad:  4.8 mmHg MV Mean grad:  1.0 mmHg     SHUNTS MV Vmax:       1.10 m/s     Systemic VTI:  0.25 m MV Vmean:      54.4 cm/s    Systemic Diam: 2.10 cm MV Decel Time: 163 msec MV E velocity: 109.00 cm/s MV A velocity: 77.00 cm/s MV E/A ratio:  1.42 Marcina Millard MD Electronically signed by Marcina Millard MD Signature Date/Time: 08/27/2022/1:16:52 PM    Final    US Venous Img Lower Bilateral (DVT)  Result Date: 08/26/2022 CLINICAL DATA:  220372 Positive D dimer [id number]. EXAM: BILATERAL LOWER EXTREMITY VENOUS DOPPLER ULTRASOUND TECHNIQUE: Gray-scale sonography with compression, as well as color and duplex ultrasound, were performed to evaluate the deep venous system(s) from the level of the common femoral vein through the popliteal and proximal calf veins. COMPARISON:  None Available. FINDINGS: VENOUS Normal compressibility of the common femoral, superficial femoral, and  popliteal veins, as well as the visualized calf veins. Visualized portions of profunda femoral vein and great saphenous vein unremarkable. No filling defects to suggest DVT on grayscale or color Doppler imaging. Doppler waveforms show normal direction of venous flow, normal respiratory plasticity and response to augmentation. OTHER Cyst in the left popliteal fossa measuring up to 5 cm, likely Baker's cyst. Limitations: none IMPRESSION: 1. No lower extremity DVT. 2. 5 cm left Baker's cyst. Electronically Signed   By: Orvan Falconer M.D.   On: 08/26/2022 15:45   NM Pulmonary Perfusion  Result Date: 08/26/2022 CLINICAL DATA:  Pulmonary embolus suspected EXAM: NUCLEAR MEDICINE PERFUSION LUNG SCAN TECHNIQUE: Perfusion images were obtained in multiple projections after intravenous injection of radiopharmaceutical. Ventilation scans intentionally deferred if perfusion scan and chest x-ray adequate for interpretation during COVID 19 epidemic. RADIOPHARMACEUTICALS:  4.2 mCi Tc-55m MAA IV COMPARISON:  None Available. FINDINGS: Bilateral mild-to-moderate perfusion defects. No large perfusion defects. IMPRESSION: Nondiagnostic (intermediate probability). Correlate with lower extremity ultrasound and consider chest CTA for further evaluation. Electronically Signed   By: Allegra Lai M.D.   On: 08/26/2022 15:10   DG Chest Port 1 View  Result Date: 08/26/2022 CLINICAL DATA:  89 year old female with history of hematemesis and generalized weakness. EXAM: PORTABLE CHEST 1 VIEW COMPARISON:  Chest x-ray 06/07/2022. FINDINGS: Lung volumes are low. Bibasilar opacities favored to reflect areas of subsegmental atelectasis. Diffuse interstitial prominence and widespread peribronchial cuffing. No definite pleural effusions. No pneumothorax. No evidence of pulmonary edema. Heart size is normal. Upper mediastinal contours are within normal limits allowing for patient positioning. Atherosclerotic calcifications in the thoracic  aorta. IMPRESSION: 1. The appearance of the chest is concerning for probable acute bronchitis, as above. Some degree of underlying interstitial lung disease is also suspected. 2. Aortic atherosclerosis. Electronically Signed   By: Trudie Reed M.D.   On: 08/26/2022 05:51   CT Angio Chest/Abd/Pel for Dissection W and/or Wo Contrast  Result Date: 08/22/2022 CLINICAL DATA:  Vomiting and diarrhea. Concern for acute aortic syndrome. History of breast cancer. EXAM: CT ANGIOGRAPHY CHEST, ABDOMEN AND PELVIS TECHNIQUE: Non-contrast CT of the chest was initially obtained. Multidetector CT imaging through the chest, abdomen and pelvis was performed using the standard protocol during bolus administration of intravenous contrast. Multiplanar reconstructed images and MIPs were obtained and  reviewed to evaluate the vascular anatomy. RADIATION DOSE REDUCTION: This exam was performed according to the departmental dose-optimization program which includes automated exposure control, adjustment of the mA and/or kV according to patient size and/or use of iterative reconstruction technique. CONTRAST:  80mL OMNIPAQUE IOHEXOL 350 MG/ML SOLN COMPARISON:  CT chest dated June 08, 2022. CT abdomen pelvis dated March 18, 2022. FINDINGS: CTA CHEST FINDINGS Cardiovascular: Preferential opacification of the thoracic aorta. No evidence of thoracic aortic aneurysm or dissection. No intramural hematoma. Coronary, aortic arch, and branch vessel atherosclerotic vascular disease. Normal heart size. No pericardial effusion. No pulmonary embolism. Mediastinum/Nodes: No enlarged mediastinal, hilar, or axillary lymph nodes. Thyroid gland, trachea, and esophagus demonstrate no significant findings. Lungs/Pleura: Bibasilar scarring. No focal consolidation, pleural effusion, or pneumothorax. Musculoskeletal: Status post bilateral mastectomies. No acute or significant osseous findings. Review of the MIP images confirms the above findings. CTA  ABDOMEN AND PELVIS FINDINGS VASCULAR Aorta: Unchanged infrarenal abdominal aortic aneurysm measuring up to 3.6 cm in diameter. No dissection, vasculitis or significant stenosis. Calcified and noncalcified atherosclerotic plaque. 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 Appearance is suggestive of median arcuate ligament compression. 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 Renals: Single renal arteries bilaterally. Chronic severe stenosis of the distal left renal artery with severe left renal atrophy. Patent right proximal renal artery stent. IMA: Patent without evidence of aneurysm, dissection, vasculitis or significant stenosis. Inflow: Patent without evidence of aneurysm, dissection, vasculitis or significant stenosis. Veins: No obvious venous abnormality within the limitations of this arterial phase study. Review of the MIP images confirms the above findings. NON-VASCULAR Hepatobiliary: No focal liver abnormality is seen. No gallstones, gallbladder wall thickening, or biliary dilatation. Pancreas: No ductal dilatation or surrounding inflammatory changes. Multiple cystic pancreatic lesions measuring up to 2.6 cm, previously 2.5 cm. Spleen: Normal in size without focal abnormality. Adrenals/Urinary Tract: Adrenal glands are unremarkable. Chronic severe left renal atrophy. No renal calculi or hydronephrosis. Bladder is unremarkable. Stomach/Bowel: Unchanged small hiatal hernia. No bowel wall thickening, distention, or surrounding inflammatory changes. History of prior appendectomy. Lymphatic: No enlarged abdominal or pelvic lymph nodes. Reproductive: Status post hysterectomy. No adnexal masses. Other: No free fluid or pneumoperitoneum. Musculoskeletal: No acute or significant osseous findings. Review of the MIP images confirms the above findings. IMPRESSION: 1. No evidence of acute aortic syndrome. 2. Unchanged infrarenal abdominal  aortic aneurysm measuring up to 3.6 cm in diameter. Recommend follow-up ultrasound every 2 years. 3. 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 No evidence of bowel ischemia. 4. Multiple cystic pancreatic lesions measuring up to 2.6 cm. Given the patient's age, no routine follow-up is recommended. 5.  Aortic atherosclerosis (ICD10-I70.0). Electronically Signed   By: Obie Dredge M.D.   On: 08/22/2022 13:34   CT HEAD WO CONTRAST ( )  Result Date: 08/22/2022 CLINICAL DATA:  Acute onset of severe headache. EXAM: CT HEAD WITHOUT CONTRAST TECHNIQUE: Contiguous axial images were obtained from the base of the skull through the vertex without intravenous contrast. RADIATION DOSE REDUCTION: This exam was performed according to the departmental dose-optimization program which includes automated exposure control, adjustment of the mA and/or kV according to patient size and/or use of iterative reconstruction technique. COMPARISON:  06/05/2022 FINDINGS: Brain: No evidence of intracranial hemorrhage, acute infarction, hydrocephalus, extra-axial collection, or mass lesion/mass effect. Mild cerebral atrophy and chronic small vessel disease again noted. Vascular:  No hyperdense vessel or other acute findings. Skull:  No evidence of fracture or other significant bone abnormality. Sinuses/Orbits:  No acute findings. Other: None. IMPRESSION: No acute intracranial abnormality. Stable mild cerebral atrophy and chronic small vessel disease. Electronically Signed   By: Danae Orleans M.D.   On: 08/22/2022 13:22   DG Bone Density  Result Date: 08/20/2022 EXAM: DUAL X-RAY ABSORPTIOMETRY (DXA) FOR BONE MINERAL DENSITY IMPRESSION: Your patient Jasslyn Finkel completed a BMD test on 08/20/2022 using the [employer] DXA System (software version: 14.10) manufactured by [employer]. The following summarizes the results of our evaluation. Technologist: [facility] PATIENT BIOGRAPHICAL: Name: Kieanna, Rollo Patient ID:  [id number] Birth Date: 11/01/1926 Height: 64.0 in. Gender: Female Exam Date: 08/20/2022 Weight: 135.1 lbs. Indications: History of Breast Cancer, Advanced Age, Hysterectomy, Caucasian, History of Radiation, Oophorectomy Bilateral, Postmenopausal Fractures: Treatments: Anastrozole, Calcium, Prolia, Vitamin D DENSITOMETRY RESULTS: Site         Region     Measured Date Measured Age WHO Classification Young Adult T-score BMD         %Change vs. Previous Significant Change (*) DualFemur Neck Right 08/20/2022 93.2 Osteoporosis -2.7 0.669 g/cm2 2.3% - DualFemur Neck Right 03/07/2020 90.8 Osteoporosis -2.8 0.654 g/cm2 - - DualFemur Total Mean 08/20/2022 93.2 Osteoporosis -2.6 0.685 g/cm2 1.3% - DualFemur Total Mean 03/07/2020 90.8 Osteoporosis -2.6 0.676 g/cm2 - - Left Forearm Radius 33% 08/20/2022 93.2 Osteoporosis -3.1 0.604 g/cm2 10.2% Yes Left Forearm Radius 33% 03/07/2020 90.8 Osteoporosis -3.7 0.548 g/cm2 - - ASSESSMENT: The BMD measured at Forearm Radius 33% is 0.604 g/cm2 with a T-score of -3.1. This patient is considered osteoporotic according to World Health Organization [facility]) criteria. The scan quality is good. Lumbar spine was not utilized due to advanced degenerative changes. Compared with prior study, there has been no significant change in the total hip. World Science writer [facility]) criteria for post-menopausal, Caucasian Women: Normal:                   T-score at or above -1 SD Osteopenia/low bone mass: T-score between -1 and -2.5 SD Osteoporosis:             T-score at or below -2.5 SD RECOMMENDATIONS: 1. All patients should optimize calcium and vitamin D intake. 2. Consider FDA-approved medical therapies in postmenopausal women and men aged 15 years and older, based on the following: a. A hip or vertebral(clinical or morphometric) fracture b. T-score < -2.5 at the femoral neck or spine after appropriate evaluation to exclude secondary causes c. Low bone mass (T-score between -1.0 and -2.5 at the femoral  neck or spine) and a 10-year probability of a hip fracture > 3% or a 10-year probability of a major osteoporosis-related fracture > 20% based on the [place]-adapted WHO algorithm 3. Clinician judgment and/or patient preferences may indicate treatment for people with 10-year fracture probabilities above or below these levels FOLLOW-UP: People with diagnosed cases of osteoporosis or at high risk for fracture should have regular bone mineral density tests. For patients eligible for Medicare, routine testing is allowed once every 2 years. The testing frequency can be increased to one year for patients who have rapidly progressing disease, those who are receiving or discontinuing medical therapy to restore bone mass, or have additional risk factors. I have reviewed this report, and agree with the above findings. [facility] Electronically Signed   By: Gerome Sam III M.D.   On: 08/20/2022 17:57    ECHO 06/29/2022 DOPPLER ECHO and OTHER SPECIAL PROCEDURES  Aortic: MODERATE AR                MILD AS                         173.0 cm/sec peak vel      12.0 mmHg peak grad                         5.6 mmHg mean grad         1.6 cm^2 by DOPPLER                 Mitral: No MR                      MILD MS                         MV Inflow E Vel = 71.4 cm/sec     MV Annulus E'Vel = 6.0 cm/sec                         E/E'Ratio = 11.9              Tricuspid: MILD TR                    No TS                         248.0 cm/sec peak TR vel   27.6 mmHg peak RV pressure              Pulmonary: MILD PR                    No PS  _________________________________________________________________________________________  INTERPRETATION  NORMAL LEFT VENTRICULAR SYSTOLIC FUNCTION  NORMAL RIGHT VENTRICULAR SYSTOLIC FUNCTION  MODERATE VALVULAR REGURGITATION (See above)  MILD VALVULAR STENOSIS (See above)  _________________________________________________________________________________________   Electronically signed by      Danella Penton, MD on 06/30/2022 05: 31 PM           Performed By: Rayetta Humphrey, RCS     Ordering Physician: Ned Clines   TELEMETRY reviewed by me (LT) 08/28/2022 : NSR rate 70s -80s with occasional PACs  EKG reviewed by me: normal sinus rhythm with nonspecific T wave changes that are similar to prior study from April 13.  Data reviewed by me (LT) 08/28/2022: GI note, ID [email or web address] progress notelast 24h vitals tele labs imaging I/O    Principal Problem:   Hematemesis Active Problems:   Gout   Anemia of chronic renal failure, stage 3b   Depression   Essential hypertension   Pneumonia due to infectious organism   NSTEMI (non-ST elevated myocardial infarction)   Hypertensive urgency   Hypokalemia   Hypocalcemia   Chronic diastolic CHF (congestive heart failure)   Acute blood loss anemia   Hypomagnesemia   Hypophosphatemia   Type II diabetes mellitus with renal manifestations   Rash   Chest pain   Sepsis due to Streptococcus species with acute hypoxic respiratory failure without septic shock   CHF NYHA class III, acute, diastolic    ASSESSMENT AND PLAN:  Tina Curtis is a 89yoF with a PMH of CKD 3, DM2, HFpEF (EF >55%, G1 DD 06/2022), renal artery stenosis s/p stenting, history of breast cancer who presented to [facility] ED 08/26/2022  from [facility] with reported vomiting and coffee-ground emesis for 2 week and weeks and right upper abdominal pain with initial concern for UGIB. Evaluated by GI, patient declined EGD. Hgb stable overnight. EKG demonstrates normal sinus rhythm without tele evidence of atrial fibrillation or other arrhythmias so far. The patient is being treated for streptococcus bacteremia and possible PNA. Stool pathogen panel positive for sapovirus. Troponin uptrending to current peak of 3677.   # streptococcus bacteremia  # PNA  # hematemesis  # sapovirus  Agree with current therapy per primary team, H/H stable. No further  hematemesis.  ID following and managing Abx   # NSTEMI Troponins elevated and uptrending from 88, 856, to peak of 3677 this AM. EKG with nonspecific TW changes. Chest pain free this AM, just feeling generally tired.  - s/p 325mg  Aspirin. Continue Asprin 81mg  daily and restart clopidogrel 75mg  daily tomorrow. She had been on DAPT since renal artery stenting in ~2021. Continue likely indefinitely  - ok with GI to start heparin per pharmacy protocol, continue for 48hrs - continue atorvastatin 40mg  daily  - continue metoprolol XL - continue isosorbide 30mg  daily  - echo complete with preserved EF, no rWMAs - continue to treat medically with 48hrs heparin + DAPT the absence of chest pain, and in setting of infection / fever from bacteremia and GI illness, multiple co morbidities, and elderly age.   # moderate to severe TR # moderate to severe MR # HFpEF  Clinically hypervolemic with O2 requirement, crackles to auscultation. Improving with IV lasix. Consider repeat echo following discharge Re: MR severity. She is rather active and independent despite her elderly age, but does not seem interested in consideration of valve repair/replacement if offered (?mitraclip)  - continue lasix as above.   # electrolyte disturbances # normal sinus rhythm  Presents to [facility] with profuse nausea and vomiting and diarrhea with poor p.o. intake reportedly for the past month.  EKG read by the computer as atrial fibrillation, but this is incorrect.  Read by myself and Dr. Darrold Junker appears to be normal sinus rhythm with occasional premature atrial contractions noted on telemetry without evidence of atrial fibrillation, or other arrhythmias noted so far. -Agree with current therapy per primary team -Recommend monitoring and replenishing electrolytes for goal K >4, and mag >2 -recommending holding jardiance for now  # ? Drug rash Large confluent area of erythema migrating from chest to back and R flank - unlikely  jardiance since this has been held.  -management per primary/ ID  This patient's plan of care was discussed and created with Dr. Juliann Pares and he is in agreement.  Signed: Rebeca Allegra , PA-C 08/28/2022, 9:12 AM [facility]

## 2022-08-28 NOTE — Progress Notes (Signed)
ANTICOAGULATION CONSULT NOTE  Pharmacy Consult for heparin Indication: NSTEMI  Allergies  Allergen Reactions   Penicillins Anaphylaxis   Drug Ingredient [Zinc]     Patient Measurements: Height:  (162.6 cm) Weight: 65.8 kg (145 lb 1 oz) IBW/kg (Calculated) : 54.7 Heparin Dosing Weight: 60 kg  Vital Signs: Temp: 98.3 F (36.8 C) (04/19 1211) Temp Source: Oral (04/19 1211) BP: 144/67 (04/19 1211) Pulse Rate: 78 (04/19 1211)  Labs: Recent Labs    08/26/22 0526 08/26/22 0754 08/26/22 1835 08/27/22 0426 08/27/22 0742 08/27/22 1019 08/27/22 1257 08/27/22 1724 08/27/22 1815 08/28/22 0214 08/28/22 1128  HGB 11.9*   < > 10.6* 10.1* 10.3*  --   --   --   --  10.0*  --   HCT 35.1*   < > 31.4* 29.9* 31.5*  --   --   --   --  29.0*  --   PLT 176   < > 166 154 154  --   --   --   --  165  --   APTT 33  --   --   --   --   --   --   --   --   --   --   LABPROT 18.1*  --   --   --   --   --   --   --   --   --   --   INR 1.5*  --   --   --   --   --   --   --   --   --   --   HEPARINUNFRC  --   --   --   --   --   --   --   --  0.30 0.28* 0.45  CREATININE 1.34*  --  1.48* 1.45*  --   --   --   --   --  1.62*  --   TROPONINIHS 88*   < >  --  3,086*  --  3,299* 2,667* 2,308*  --   --   --    < > = values in this interval not displayed.     Estimated Creatinine Clearance: 20.2 mL/min (A) (by C-G formula based on SCr of 1.62 mg/dL (H)).   Medical History: Past Medical History:  Diagnosis Date   Arthritis    Gout   Chronic kidney disease    Diarrhea    Hypercholesteremia    Hypertension    Neuropathy    feet and lower legs   Seasonal allergies    Skin cancer    face    Medications:  No evidence of PTA anticoagulation   Assessment: Pharmacy consulted to dose heparin in this 89 yo F who presents to the ED with Hematemesis.  Trops 856 > 3677.  Cardiology and GI both consulted, GI has cleared pt for receiving heparin gtt.  Baseline Labs: aPTT - 33; INR -  1.5 Hgb - 10.3; Plts - 154  Goal of Therapy:  Anti-xa 0.3-0.7 / aPTT 66-102s Monitor platelets by anticoagulation protocol: Yes  Date Time HL Rate/Comment 4/18 1815 0.30 Therapeutic x 1  4/19 0214 0.28 Subtherapeutic 4/19 1128 0.45 Therapeutic x 1    Plan:   Continue heparin infusion at 850 units/hr Check confirmatory anti-Xa level in 8 hours Continue to monitor H&H and platelets daily while on heparin gtt.  Bettey Costa, PharmD Clinical Pharmacist 08/28/2022 12:26 PM

## 2022-08-28 NOTE — Progress Notes (Signed)
[facility]  - [place] at [facility]   PATIENT NAME: Tina Curtis    MR#:  [id number]  DATE OF BIRTH:  1926/07/26  SUBJECTIVE:  patient's daughter at bedside. Patient appears more alert today. Getting IV antibiotic. Appetite poor. Feels week. Skin rash more feeding looks like some light pink patches on the back. Denies any itching.  VITALS:  Blood pressure (!) 144/67, pulse 78, temperature 98.3 F (36.8 C), temperature source Oral, resp. rate (!) 23, height  (1.626 m), weight 65.8 kg, SpO2 95 %.  PHYSICAL EXAMINATION:   GENERAL:  89 y.o.-year-old patient with no acute distress. Weak, deconditioned, critically ill LUNGS: decreased breath sounds bilaterally, no wheezing CARDIOVASCULAR: S1, S2 normal. No murmur  tachycardia ABDOMEN: Soft, nontender, nondistended. Bowel sounds present.  EXTREMITIES:trace  edema b/l.    NEUROLOGIC: nonfocal  patient is alert and awake SKIN erythematous rash on right arm, chest and flank-- more like pink and I feel personally is feeding  LABORATORY PANEL:  CBC Recent Labs  Lab 08/28/22 0214  WBC 14.1*  HGB 10.0*  HCT 29.0*  PLT 165     Chemistries  Recent Labs  Lab 08/26/22 0526 08/26/22 1835 08/28/22 0214  NA 139   < > 132*  K 2.5*   < > 3.5  CL 106   < > 107  CO2 21*   < > 18*  GLUCOSE 182*   < > 120*  BUN 21   < > 27*  CREATININE 1.34*   < > 1.62*  CALCIUM 7.3*   < > 6.4*  MG 1.4*   < > 2.3  AST 56*  --   --   ALT 35  --   --   ALKPHOS 72  --   --   BILITOT 1.1  --   --    < > = values in this interval not displayed.    Cardiac Enzymes No results for input(s): "TROPONINI" in the last 168 hours. RADIOLOGY:  ECHOCARDIOGRAM COMPLETE  Result Date: 08/27/2022    ECHOCARDIOGRAM REPORT   Patient Name:   Tina Curtis Date of Exam: 08/27/2022 Medical Rec #:  [id number]     Height:       64.0 in Accession #:    [id number]    Weight:       132.3 lb Date of Birth:  Apr 23, 1927    BSA:          1.641 m Patient Age:     89 years      BP:           136/66 mmHg Patient Gender: F             HR:           67 bpm. Exam Location:  [facility] Procedure: 2D Echo, Cardiac Doppler and Color Doppler Indications:     Elevated Troponin  History:         Patient has no prior history of Echocardiogram examinations.                  CHF, Acute MI, Signs/Symptoms:Chest Pain,                  Dizziness/Lightheadedness and Fatigue; Risk                  Factors:Hypertension and Diabetes. Breast CA.  Sonographer:     Mikki Harbor Referring Phys:  [id number] [facility] MICHELLE TANG Diagnosing Phys: Marcina Millard MD IMPRESSIONS  1. Left ventricular ejection fraction, by estimation, is 60 to 65%. The left ventricle has normal function. The left ventricle has no regional wall motion abnormalities. Left ventricular diastolic parameters are consistent with Grade I diastolic dysfunction (impaired relaxation).  2. Right ventricular systolic function is normal. The right ventricular size is normal. There is moderately elevated pulmonary artery systolic pressure.  3. Left atrial size was mild to moderately dilated.  4. Right atrial size was mild to moderately dilated.  5. The mitral valve is normal in structure. Moderate to severe mitral valve regurgitation. No evidence of mitral stenosis.  6. Tricuspid valve regurgitation is moderate to severe.  7. The aortic valve is normal in structure. Aortic valve regurgitation is mild. No aortic stenosis is present.  8. The inferior vena cava is normal in size with greater than 50% respiratory variability, suggesting right atrial pressure of 3 mmHg. FINDINGS  Left Ventricle: Left ventricular ejection fraction, by estimation, is 60 to 65%. The left ventricle has normal function. The left ventricle has no regional wall motion abnormalities. The left ventricular internal cavity size was normal in size. There is  no left ventricular hypertrophy. Left ventricular diastolic parameters are consistent with Grade I diastolic  dysfunction (impaired relaxation). Right Ventricle: The right ventricular size is normal. No increase in right ventricular wall thickness. Right ventricular systolic function is normal. There is moderately elevated pulmonary artery systolic pressure. The tricuspid regurgitant velocity is 3.60 m/s, and with an assumed right atrial pressure of 8 mmHg, the estimated right ventricular systolic pressure is 59.8 mmHg. Left Atrium: Left atrial size was mild to moderately dilated. Right Atrium: Right atrial size was mild to moderately dilated. Pericardium: There is no evidence of pericardial effusion. Mitral Valve: The mitral valve is normal in structure. Moderate to severe mitral valve regurgitation. No evidence of mitral valve stenosis. MV peak gradient, 4.8 mmHg. The mean mitral valve gradient is 1.0 mmHg. Tricuspid Valve: The tricuspid valve is normal in structure. Tricuspid valve regurgitation is moderate to severe. No evidence of tricuspid stenosis. Aortic Valve: The aortic valve is normal in structure. Aortic valve regurgitation is mild. No aortic stenosis is present. Aortic valve mean gradient measures 4.0 mmHg. Aortic valve peak gradient measures 10.1 mmHg. Aortic valve area, by VTI measures 2.75  cm. Pulmonic Valve: The pulmonic valve was normal in structure. Pulmonic valve regurgitation is not visualized. No evidence of pulmonic stenosis. Aorta: The aortic root is normal in size and structure. Venous: The inferior vena cava is normal in size with greater than 50% respiratory variability, suggesting right atrial pressure of 3 mmHg. IAS/Shunts: No atrial level shunt detected by color flow Doppler.  LEFT VENTRICLE PLAX 2D LVIDd:         4.50 cm   Diastology LVIDs:         2.80 cm   LV e' medial:    6.42 cm/s LV PW:         1.00 cm   LV E/e' medial:  17.0 LV IVS:        1.30 cm   LV e' lateral:   8.70 cm/s LVOT diam:     2.10 cm   LV E/e' lateral: 12.5 LV SV:         86 LV SV Index:   52 LVOT Area:     3.46 cm   RIGHT VENTRICLE RV Basal diam:  3.65 cm RV Mid diam:    2.70 cm RV S prime:     8.92 cm/s TAPSE (  M-mode): 2.0 cm LEFT ATRIUM              Index        RIGHT ATRIUM           Index LA diam:        4.10 cm  2.50 cm/m   RA Area:     19.20 cm LA Vol (A2C):   115.0 ml 70.07 ml/m  RA Volume:   50.40 ml  30.71 ml/m LA Vol (A4C):   61.7 ml  37.60 ml/m LA Biplane Vol: 85.5 ml  52.10 ml/m  AORTIC VALVE                    PULMONIC VALVE AV Area (Vmax):    2.03 cm     PV Vmax:       0.90 m/s AV Area (Vmean):   2.40 cm     PV Peak grad:  3.2 mmHg AV Area (VTI):     2.75 cm AV Vmax:           159.00 cm/s AV Vmean:          91.500 cm/s AV VTI:            0.311 m AV Peak Grad:      10.1 mmHg AV Mean Grad:      4.0 mmHg LVOT Vmax:         93.40 cm/s LVOT Vmean:        63.300 cm/s LVOT VTI:          0.247 m LVOT/AV VTI ratio: 0.79  AORTA Ao Root diam: 3.30 cm Ao Asc diam:  4.10 cm MITRAL VALVE                TRICUSPID VALVE MV Area (PHT): 4.65 cm     TR Peak grad:   51.8 mmHg MV Area VTI:   2.77 cm     TR Vmax:        360.00 cm/s MV Peak grad:  4.8 mmHg MV Mean grad:  1.0 mmHg     SHUNTS MV Vmax:       1.10 m/s     Systemic VTI:  0.25 m MV Vmean:      54.4 cm/s    Systemic Diam: 2.10 cm MV Decel Time: 163 msec MV E velocity: 109.00 cm/s MV A velocity: 77.00 cm/s MV E/A ratio:  1.42 Marcina Millard MD Electronically signed by Marcina Millard MD Signature Date/Time: 08/27/2022/1:16:52 PM    Final    US Venous Img Lower Bilateral (DVT)  Result Date: 08/26/2022 CLINICAL DATA:  [id number] Positive D dimer [id number]. EXAM: BILATERAL LOWER EXTREMITY VENOUS DOPPLER ULTRASOUND TECHNIQUE: Gray-scale sonography with compression, as well as color and duplex ultrasound, were performed to evaluate the deep venous system(s) from the level of the common femoral vein through the popliteal and proximal calf veins. COMPARISON:  None Available. FINDINGS: VENOUS Normal compressibility of the common femoral, superficial femoral, and  popliteal veins, as well as the visualized calf veins. Visualized portions of profunda femoral vein and great saphenous vein unremarkable. No filling defects to suggest DVT on grayscale or color Doppler imaging. Doppler waveforms show normal direction of venous flow, normal respiratory plasticity and response to augmentation. OTHER Cyst in the left popliteal fossa measuring up to 5 cm, likely Baker's cyst. Limitations: none IMPRESSION: 1. No lower extremity DVT. 2. 5 cm left Baker's cyst. Electronically Signed   By: Orvan Falconer M.D.   On:  08/26/2022 15:45   NM Pulmonary Perfusion  Result Date: 08/26/2022 CLINICAL DATA:  Pulmonary embolus suspected EXAM: NUCLEAR MEDICINE PERFUSION LUNG SCAN TECHNIQUE: Perfusion images were obtained in multiple projections after intravenous injection of radiopharmaceutical. Ventilation scans intentionally deferred if perfusion scan and chest x-ray adequate for interpretation during COVID 19 epidemic. RADIOPHARMACEUTICALS:  4.2 mCi Tc-75m MAA IV COMPARISON:  None Available. FINDINGS: Bilateral mild-to-moderate perfusion defects. No large perfusion defects. IMPRESSION: Nondiagnostic (intermediate probability). Correlate with lower extremity ultrasound and consider chest CTA for further evaluation. Electronically Signed   By: Allegra Lai M.D.   On: 08/26/2022 15:10    Assessment and Plan  AMZIE SILLAS is a 89 y.o. female with medical history significant of hypertension, hyperlipidemia, dCHF, gout, depression, CKD-3B, neuropathy, C. difficile, breast cancer, who presents with hematemesis, nausea, vomiting, diarrhea and chest pain.  4/13, and had CTA of chest/abdomen/pelvis and was diagnosed with gastroenteritis.   Patient is normally not using oxygen, but was found to have oxygen desaturation to 87-89% on room air, which improved to 90-92% on 2 L oxygen. Of note, pt has rash in her left chest wall and left upper arm, not itchy or painful.   CXR: 1. The appearance of  the chest is concerning for probable acute bronchitis, as above. Some degree of underlying interstitial lung disease is also suspected. 2. Aortic atherosclerosis  US venous doppler 1. No lower extremity DVT. 2. 5 cm left Baker's cyst.  NM perfusion study: Indetermidate  Sepsis due to Acute bronchopneumonia Streptococcal G Bacterimia --IV rocephin + zithromax --ID consultation with Dr Rivka Safer -- patient came in with fever of 103, tachycardia, tachypnea elevated white count and hypotension, chest x-ray suggestive bronchopneumonia --BC 4/4 positive for strept species  NSTEMI --pt has no underlying cardiac hisotry (per records, family) --had some cp troponin 88-- [id number]--[id number]--[id number]--[id number] --Cardiology consult with dr Cassie Freer. Started on IV  heparin for 48 hours after discussing with GI Dr Norma Fredrickson --Echo shows EF 60-70%  with mod to severe MR,TR, mild AR --cont statins, Imdur,toprol XL -- Cardiologist reviewed EKG, they did not think patient has A-fib.   Acute diastolic CHF Valvular regurgitation --IV lasix 20 mg bid  --monitor creatinine--slight bump --input/output  Viral GE --GI PCR stool positive for Sapovirus --treat symptoms  Hypokalemia/hypomagnesimia --Pharmacy to replete electrolytes  Vomiting/hematemesis --serial hgbs table--Seen by GI dr Norma Fredrickson --IV ppi bid --no luminal eval at present and ok to use IV heparin  Type II diabetes mellitus with renal manifestations CKD IIIB: Recent A1c 9.0.  Poorly controlled.  Patient is taking Jardiance at home -Will hold Jardiance due to suspicious allergic reaction (rash) -SSI --pt follows with Dr Wynelle Link as out pt --monitor creat   Anemia of chronic renal failure, stage 3b: Hgb 11.9 (12.4 on 08/22/22   Gout -Allopurinol   Depression:  -Continue home medications   Rash: Etiology is not clear.  May be due to newly started Jardiance -Hold Jardiance  PT/OT to see pt     Procedures:none Family communication :dter  Cala Bradford at bedside Consults :cardiology, ID, GI CODE STATUS: DNR/DNI DVT Prophylaxis :Heparin gtt Level of care: Progressive Status is: Inpatient Remains inpatient appropriate because: sepsis, NSTEMI, CHF, GE    TOTAL  TIME TAKING CARE OF THIS PATIENT: 35 minutes.  >50% time spent on counselling and coordination of care  Note: This dictation was prepared with Dragon dictation along with smaller phrase technology. Any transcriptional errors that result from this process are unintentional.  Enedina Finner M.D    [facility]  CC: Primary care physician; [name], MD

## 2022-08-28 NOTE — Progress Notes (Signed)
ANTICOAGULATION CONSULT NOTE  Pharmacy Consult for heparin Indication: NSTEMI  Allergies  Allergen Reactions   Penicillins Anaphylaxis   Drug Ingredient [Zinc]     Patient Measurements: Height:  (162.6 cm) Weight: 60 kg (132 lb 4.4 oz) IBW/kg (Calculated) : 54.7 Heparin Dosing Weight: 60 kg  Vital Signs: Temp: 98.8 F (37.1 C) (04/19 0004) BP: 140/61 (04/19 0004) Pulse Rate: 79 (04/19 0004)  Labs: Recent Labs    08/26/22 0526 08/26/22 0754 08/26/22 1835 08/27/22 0426 08/27/22 0742 08/27/22 1019 08/27/22 1257 08/27/22 1724 08/27/22 1815 08/28/22 0214  HGB 11.9*   < > 10.6* 10.1* 10.3*  --   --   --   --  10.0*  HCT 35.1*   < > 31.4* 29.9* 31.5*  --   --   --   --  29.0*  PLT 176   < > 166 154 154  --   --   --   --  165  APTT 33  --   --   --   --   --   --   --   --   --   LABPROT 18.1*  --   --   --   --   --   --   --   --   --   INR 1.5*  --   --   --   --   --   --   --   --   --   HEPARINUNFRC  --   --   --   --   --   --   --   --  0.30 0.28*  CREATININE 1.34*  --  1.48* 1.45*  --   --   --   --   --   --   TROPONINIHS 88*   < >  --  1,610*  --  3,299* 2,667* 2,308*  --   --    < > = values in this interval not displayed.     Estimated Creatinine Clearance: 20.9 mL/min (A) (by C-G formula based on SCr of 1.45 mg/dL (H)).   Medical History: Past Medical History:  Diagnosis Date   Arthritis    Gout   Chronic kidney disease    Diarrhea    Hypercholesteremia    Hypertension    Neuropathy    feet and lower legs   Seasonal allergies    Skin cancer    face    Medications:  No evidence of PTA anticoagulation   Assessment: Pharmacy consulted to dose heparin in this 89 yo F who presents to the ED with Hematemesis.  Trops 856 > 3677.  Cardiology and GI both consulted, GI has cleared pt for receiving heparin gtt.  Baseline Labs: aPTT - 33; INR - 1.5 Hgb - 10.3; Plts - 154  Goal of Therapy:  Anti-xa 0.3-0.7 / aPTT 66-102s Monitor  platelets by anticoagulation protocol: Yes  Date Time HL Rate/Comment 4/18 1815 0.30 Therapeutic x 1  4/19 0214 0.28 Subtherapeutic x 1   Plan:   Increase heparin infusion to 850 units/hr Recheck anti-Xa level in 8 hours after rate change Continue to monitor H&H and platelets daily while on heparin gtt.  Otelia Sergeant, PharmD, [facility] 08/28/2022 2:57 AM

## 2022-08-28 NOTE — Evaluation (Signed)
Physical Therapy Evaluation Patient Details Name: Tina Curtis MRN: [id number] [name]: Jun 06, 1926 Today's Date: 08/28/2022  History of Present Illness  presented to ER secondary to hematemesis, nausea/vomiting, diarrhea and chest pain; admitted for management of streptococcus bacteremia, possible PNA and NSTEMI (peak troponin 3677, managed medically)  Clinical Impression  Patient resting in bed upon arrival to room; awakens easily to voice and light touch.  Endorses restless night; generally fatigued today.  Reports soreness in low back area (spasm?); may benefit from k-pad to help with muscle relaxation (MD to order).  Patient generally weak and deconditioned throughout all extremities due to acute illness; however, no focal weakness appreciated.  Currently requiring min/mod assist for bed mobility, sit/stand, standing balance and basic transfers with RW.  Consistent assist for lift off, anterior weight translation and standing balance.  Generally tremulous and somewhat anxious with mobility efforts, frustrated/discouraged with overall weakness and medical condition.  Responds well to verbal support/encouragement.  Unable to tolerate additional mobility this date due to generalized fatigue; will continue to assess/progress as medically appropriate. Of note, moderate SOB with very minimal exertion.  Did trial on RA, with sats fluctating 88-90% with transfers and toileting needs.  Supplemental O2 (1L) reapplied end of session for optimal respiratory support.  Will continue to monitor in subsequent sessions. Would benefit from skilled PT to address above deficits and promote optimal return to PLOF.; will benefit optimally from moderate intensity post-acute PT services (<3 hours/day) and consistent staff for ADLs/mobility.      Recommendations for follow up therapy are one component of a multi-disciplinary discharge planning process, led by the attending physician.  Recommendations may be updated based on  patient status, additional functional criteria and insurance authorization.  Follow Up Recommendations Can patient physically be transported by private vehicle: No     Assistance Recommended at Discharge Frequent or constant Supervision/Assistance  Patient can return home with the following  A lot of help with walking and/or transfers;A lot of help with bathing/dressing/bathroom    Equipment Recommendations    Recommendations for Other Services       Functional Status Assessment Patient has had a recent decline in their functional status and demonstrates the ability to make significant improvements in function in a reasonable and predictable amount of time.     Precautions / Restrictions Precautions Precautions: Fall Restrictions Weight Bearing Restrictions: No      Mobility  Bed Mobility Overal bed mobility: Needs Assistance Bed Mobility: Supine to Sit, Sit to Supine     Supine to sit: Min assist Sit to supine: Mod assist        Transfers Overall transfer level: Needs assistance Equipment used: Rolling walker (2 wheels) Transfers: Sit to/from Stand, Bed to chair/wheelchair/BSC Sit to Stand: Mod assist Stand pivot transfers: Mod assist         General transfer comment: assist for lift off, anterior weight translation and standing balance; fatigues quickly with exertional activities.    Ambulation/Gait               General Gait Details: unable to tolerate due to fatigue  Stairs            Wheelchair Mobility    Modified Rankin (Stroke Patients Only)       Balance Overall balance assessment: Needs assistance Sitting-balance support: No upper extremity supported, Feet supported Sitting balance-Leahy Scale: Good     Standing balance support: Bilateral upper extremity supported Standing balance-Leahy Scale: Poor  Pertinent Vitals/Pain Pain Assessment Pain Assessment: No/denies pain    Home  Living Family/patient expects to be discharged to:: Private residence Living Arrangements: Alone Available Help at Discharge: Family;Available PRN/intermittently Type of Home: Independent living facility Home Access: Level entry       Home Layout: One level Home Equipment: Rollator (4 wheels);Rolling Walker (2 wheels)      Prior Function Prior Level of Function : Independent/Modified Independent;Driving             Mobility Comments: Mod indep with 3WRW in home, 4WRW in community; no falls; no home O2. + driving, completing all community errands indep       Hand Dominance        Extremity/Trunk Assessment   Upper Extremity Assessment Upper Extremity Assessment: Generalized weakness    Lower Extremity Assessment Lower Extremity Assessment: Generalized weakness (grossly 3+ to 4-/5 throughout; no focal weakness appreciated)       Communication   Communication: HOH  Cognition Arousal/Alertness: Awake/alert Behavior During Therapy: Flat affect Overall Cognitive Status: Within Functional Limits for tasks assessed                                 General Comments: generally apologetic for situation and condition; responds well to encouragement and reassurance        General Comments      Exercises Other Exercises Other Exercises: Toilet transfer, SPT with RW, mod assist; sit/stand from [facility] with RW, min/mod assist; standing balance for hygiene, min/mod assist.  PAtient requested to leave purewick out; discussed/encouraged OOB to [facility] with staff on a regular basis (toileting schedule) to promote continence/bladder control. Patient/daughter voiced understanding.  RN/CNA informed/aware.   Assessment/Plan    PT Assessment Patient needs continued PT services  PT Problem List Decreased strength;Decreased activity tolerance;Decreased balance;Decreased mobility;Decreased coordination;Decreased knowledge of use of DME;Decreased safety awareness;Decreased  knowledge of precautions;Cardiopulmonary status limiting activity       PT Treatment Interventions DME instruction;Gait training;Functional mobility training;Therapeutic activities;Patient/family education;Cognitive remediation;Therapeutic exercise;Balance training    PT Goals (Current goals can be found in the Care Plan section)  Acute Rehab PT Goals Patient Stated Goal: to get stronger PT Goal Formulation: With patient/family Time For Goal Achievement: 09/11/22 Potential to Achieve Goals: Good    Frequency Min 3X/week     Co-evaluation               AM-PAC PT "6 Clicks" Mobility  Outcome Measure Help needed turning from your back to your side while in a flat bed without using bedrails?: None Help needed moving from lying on your back to sitting on the side of a flat bed without using bedrails?: A Lot Help needed moving to and from a bed to a chair (including a wheelchair)?: A Lot Help needed standing up from a chair using your arms (e.g., wheelchair or bedside chair)?: A Lot Help needed to walk in hospital room?: A Lot Help needed climbing 3-5 steps with a railing? : Total 6 Click Score: 13    End of Session   Activity Tolerance: Patient limited by fatigue Patient left: in bed;with call bell/phone within reach;with bed alarm set Nurse Communication: Mobility status PT Visit Diagnosis: Muscle weakness (generalized) (M62.81);Difficulty in walking, not elsewhere classified (R26.2)    Time: 1437-1510 PT Time Calculation (min) (ACUTE ONLY): 33 min   Charges:   PT Evaluation $PT Eval Moderate Complexity: 1 Mod PT Treatments $Therapeutic Activity: 8-22 mins  Cipriano Millikan H. Manson Passey, PT, DPT, NCS 08/28/22, 5:45 PM [phone]

## 2022-08-28 NOTE — Progress Notes (Signed)
ANTICOAGULATION CONSULT NOTE  Pharmacy Consult for Heparin Infusion Indication: NSTEMI  Allergies  Allergen Reactions   Penicillins Anaphylaxis   Drug Ingredient [Zinc]     Patient Measurements: Height:  (162.6 cm) Weight: 65.8 kg (145 lb 1 oz) IBW/kg (Calculated) : 54.7 Heparin Dosing Weight: 60 kg  Vital Signs: Temp: 98.3 F (36.8 C) (04/19 1211) Temp Source: Oral (04/19 1211) BP: 144/67 (04/19 1211) Pulse Rate: 78 (04/19 1211)  Labs: Recent Labs    08/26/22 0526 08/26/22 0754 08/26/22 1835 08/27/22 0426 08/27/22 0742 08/27/22 1019 08/27/22 1257 08/27/22 1724 08/27/22 1815 08/28/22 0214 08/28/22 1128  HGB 11.9*   < > 10.6* 10.1* 10.3*  --   --   --   --  10.0*  --   HCT 35.1*   < > 31.4* 29.9* 31.5*  --   --   --   --  29.0*  --   PLT 176   < > 166 154 154  --   --   --   --  165  --   APTT 33  --   --   --   --   --   --   --   --   --   --   LABPROT 18.1*  --   --   --   --   --   --   --   --   --   --   INR 1.5*  --   --   --   --   --   --   --   --   --   --   HEPARINUNFRC  --   --   --   --   --   --   --   --  0.30 0.28* 0.45  CREATININE 1.34*  --  1.48* 1.45*  --   --   --   --   --  1.62*  --   TROPONINIHS 88*   < >  --  1,610*  --  3,299* 2,667* 2,308*  --   --   --    < > = values in this interval not displayed.     Estimated Creatinine Clearance: 20.2 mL/min (A) (by C-G formula based on SCr of 1.62 mg/dL (H)).   Medical History: Past Medical History:  Diagnosis Date   Arthritis    Gout   Chronic kidney disease    Diarrhea    Hypercholesteremia    Hypertension    Neuropathy    feet and lower legs   Seasonal allergies    Skin cancer    face    Medications:  Scheduled:   allopurinol  100 mg Oral Daily   anastrozole  1 mg Oral Daily   aspirin  81 mg Oral Daily   atorvastatin  40 mg Oral Daily   cholecalciferol  2,000 Units Oral Daily   citalopram  10 mg Oral Daily   [START ON 08/29/2022] clopidogrel  75 mg Oral Daily    cyanocobalamin  1,000 mcg Oral Daily   DULoxetine  60 mg Oral Daily   furosemide  20 mg Intravenous BID   gabapentin  300 mg Oral QHS   hydrALAZINE  25 mg Oral TID   hydrocortisone cream   Topical BID   insulin aspart  0-5 Units Subcutaneous QHS   insulin aspart  0-9 Units Subcutaneous TID WC   isosorbide mononitrate  30 mg Oral Daily   metoprolol succinate  37.5 mg Oral QHS   pantoprazole (PROTONIX) IV  40 mg Intravenous Q12H   Infusions:   azithromycin Stopped (08/27/22 2222)   cefTRIAXone (ROCEPHIN)  IV Stopped (08/28/22 1244)   heparin 850 Units/hr (08/28/22 1353)   PRN: acetaminophen, albuterol, dextromethorphan-guaiFENesin, docusate sodium, hydrALAZINE, loratadine, morphine injection, nitroGLYCERIN, ondansetron (ZOFRAN) IV, traMADol  Assessment: Tina Curtis is a 89 y.o. female presenting with hematemesis, nausea, vomiting, diarrhea and chest pain. PMH significant for HTN, HLD, dCHF, gout, depression, CKD3B, neuropathy, breast cancer. Patient was not on [facility] PTA per chart review. hsTrop 856 >> [id number]. Cardiology and GI consulted. GI has cleared patient to receive heparin infusion. Pharmacy has been consulted to initiate and manage heparin infusion.   Baseline Labs: aPTT 33, PT 18.1, INR 1.5, Hgb 10.3, Hct 35.1, Plt 154   Goal of Therapy:  Heparin level 0.3-0.7 units/ml aPTT 66-102 seconds Monitor platelets by anticoagulation protocol: Yes   Date Time HL Rate/Comment  4/18 1815 0.30 700/therapeutic x 1  4/19 0214 0.28 700/subtherapeutic 4/19 1128 0.45 850/therapeutic x 1 4/19 1954 0.39 850/therapeutic x2   Plan:   Continue heparin infusion at 850 units/hr Check HL daily while on heparin infusion Continue to monitor H&H and platelets daily while on heparin infusion   Celene Squibb, PharmD PGY1 Pharmacy Resident 08/28/2022 4:19 PM

## 2022-08-29 DIAGNOSIS — J189 Pneumonia, unspecified organism: Secondary | ICD-10-CM | POA: Diagnosis not present

## 2022-08-29 DIAGNOSIS — I214 Non-ST elevation (NSTEMI) myocardial infarction: Secondary | ICD-10-CM | POA: Diagnosis not present

## 2022-08-29 DIAGNOSIS — I5033 Acute on chronic diastolic (congestive) heart failure: Secondary | ICD-10-CM | POA: Diagnosis not present

## 2022-08-29 DIAGNOSIS — A409 Streptococcal sepsis, unspecified: Secondary | ICD-10-CM | POA: Diagnosis not present

## 2022-08-29 LAB — BASIC METABOLIC PANEL
Anion gap: 10 (ref 5–15)
BUN: 30 mg/dL — ABNORMAL HIGH (ref 8–23)
CO2: 19 mmol/L — ABNORMAL LOW (ref 22–32)
Calcium: 6.4 mg/dL — CL (ref 8.9–10.3)
Chloride: 105 mmol/L (ref 98–111)
Creatinine, Ser: 1.81 mg/dL — ABNORMAL HIGH (ref 0.44–1.00)
GFR, Estimated: 26 mL/min — ABNORMAL LOW (ref >60)
Glucose, Bld: 99 mg/dL (ref 70–99)
Potassium: 3.6 mmol/L (ref 3.5–5.1)
Sodium: 134 mmol/L — ABNORMAL LOW (ref 135–145)

## 2022-08-29 LAB — CBC
HCT: 29.7 % — ABNORMAL LOW (ref 36.0–46.0)
Hemoglobin: 10.1 g/dL — ABNORMAL LOW (ref 12.0–15.0)
MCH: 30.7 pg (ref 26.0–34.0)
MCHC: 34 g/dL (ref 30.0–36.0)
MCV: 90.3 fL (ref 80.0–100.0)
Platelets: 194 10*3/uL (ref 150–400)
RBC: 3.29 MIL/uL — ABNORMAL LOW (ref 3.87–5.11)
RDW: 14.3 % (ref 11.5–15.5)
WBC: 13.1 10*3/uL — ABNORMAL HIGH (ref 4.0–10.5)
nRBC: 0 % (ref 0.0–0.2)

## 2022-08-29 LAB — CULTURE, BLOOD (ROUTINE X 2): Special Requests: ADEQUATE

## 2022-08-29 LAB — MAGNESIUM: Magnesium: 1.8 mg/dL (ref 1.7–2.4)

## 2022-08-29 LAB — HEPARIN LEVEL (UNFRACTIONATED): Heparin Unfractionated: 0.4 IU/mL (ref 0.30–0.70)

## 2022-08-29 LAB — PHOSPHORUS: Phosphorus: 2.6 mg/dL (ref 2.5–4.6)

## 2022-08-29 LAB — ALBUMIN: Albumin: 2.8 g/dL — ABNORMAL LOW (ref 3.5–5.0)

## 2022-08-29 LAB — GLUCOSE, CAPILLARY
Glucose-Capillary: 93 mg/dL (ref 70–99)
Glucose-Capillary: 152 mg/dL — ABNORMAL HIGH (ref 70–99)
Glucose-Capillary: 100 mg/dL — ABNORMAL HIGH (ref 70–99)
Glucose-Capillary: 112 mg/dL — ABNORMAL HIGH (ref 70–99)

## 2022-08-29 MED ORDER — CALCIUM GLUCONATE-NACL 2-0.675 GM/100ML-% IV SOLN
2.0000 g | Freq: Once | INTRAVENOUS | Status: AC
  Administered 2022-08-29: 2000 mg via INTRAVENOUS
  Filled 2022-08-29: qty 100

## 2022-08-29 MED ORDER — CALCIUM CARBONATE 1250 (500 CA) MG PO TABS
1000.0000 mg | ORAL_TABLET | Freq: Two times a day (BID) | ORAL | Status: DC
  Administered 2022-08-29 – 2022-09-02 (×8): 2500 mg via ORAL
  Filled 2022-08-29 (×9): qty 2

## 2022-08-29 MED ORDER — ENOXAPARIN SODIUM 30 MG/0.3ML IJ SOSY
30.0000 mg | PREFILLED_SYRINGE | INTRAMUSCULAR | Status: DC
  Administered 2022-08-29 – 2022-09-01 (×4): 30 mg via SUBCUTANEOUS
  Filled 2022-08-29 (×4): qty 0.3

## 2022-08-29 MED ORDER — PANTOPRAZOLE SODIUM 40 MG PO TBEC
40.0000 mg | DELAYED_RELEASE_TABLET | Freq: Every day | ORAL | Status: DC
  Administered 2022-08-29 – 2022-09-02 (×5): 40 mg via ORAL
  Filled 2022-08-29 (×5): qty 1

## 2022-08-29 MED ORDER — AZITHROMYCIN 250 MG PO TABS
250.0000 mg | ORAL_TABLET | Freq: Every day | ORAL | Status: DC
  Administered 2022-08-29 – 2022-08-31 (×3): 250 mg via ORAL
  Filled 2022-08-29 (×4): qty 1

## 2022-08-29 MED ORDER — MAGNESIUM SULFATE 2 GM/50ML IV SOLN
2.0000 g | Freq: Once | INTRAVENOUS | Status: AC
  Administered 2022-08-29: 2 g via INTRAVENOUS
  Filled 2022-08-29: qty 50

## 2022-08-29 NOTE — Progress Notes (Signed)
[facility]  - [place] [facility]   PATIENT NAME: Tina Curtis    MR#:  [id number]  DATE OF BIRTH:  1926-07-16  SUBJECTIVE:  patient's daughter at bedside. Patient appears more alert today and sitting out in the chair. Her rash overall is feeding. Patient does have some back pain she rates 3/10. Asked her if she wants to get MRI done she wants to wait. Some diarrhea. Tolerating PO diet a little bit better.   VITALS:  Blood pressure (!) 152/71, pulse 75, temperature 98.3 F (36.8 C), resp. rate 16, height  (1.626 m), weight 67.9 kg, SpO2 97 %.  PHYSICAL EXAMINATION:   GENERAL:  89 y.o.-year-old patient with no acute distress. Weak, deconditioned LUNGS: decreased breath sounds bilaterally, no wheezing CARDIOVASCULAR: S1, S2 normal. No murmur  tachycardia ABDOMEN: Soft, nontender, nondistended. Bowel sounds present.  EXTREMITIES:trace  edema b/l.    NEUROLOGIC: nonfocal  patient is alert and awake SKIN erythematous rash on right arm, chest and flank-- rash fading  LABORATORY PANEL:  CBC Recent Labs  Lab 08/29/22 0523  WBC 13.1*  HGB 10.1*  HCT 29.7*  PLT 194     Chemistries  Recent Labs  Lab 08/26/22 0526 08/26/22 1835 08/29/22 1006  NA 139   < > 134*  K 2.5*   < > 3.6  CL 106   < > 105  CO2 21*   < > 19*  GLUCOSE 182*   < > 99  BUN 21   < > 30*  CREATININE 1.34*   < > 1.81*  CALCIUM 7.3*   < > 6.4*  MG 1.4*   < > 1.8  AST 56*  --   --   ALT 35  --   --   ALKPHOS 72  --   --   BILITOT 1.1  --   --    < > = values in this interval not displayed.    Assessment and Plan  Tina Curtis is a 89 y.o. female with medical history significant of hypertension, hyperlipidemia, dCHF, gout, depression, CKD-3B, neuropathy, C. difficile, breast cancer, who presents with hematemesis, nausea, vomiting, diarrhea and chest pain.  4/13, and had CTA of chest/abdomen/pelvis and was diagnosed with gastroenteritis.   Patient is normally not using oxygen,  but was found to have oxygen desaturation to 87-89% on room air, which improved to 90-92% on 2 L oxygen. Of note, pt has rash in her left chest wall and left upper arm, not itchy or painful.   CXR: 1. The appearance of the chest is concerning for probable acute bronchitis, as above. Some degree of underlying interstitial lung disease is also suspected. 2. Aortic atherosclerosis  US venous doppler 1. No lower extremity DVT. 2. 5 cm left Baker's cyst.  NM perfusion study: Indetermidate  Sepsis due to Acute bronchopneumonia Streptococcal G Bacterimia --IV rocephin + zithromax --ID consultation with Dr Rivka Safer -- patient came in with fever of 103, tachycardia, tachypnea elevated white count and hypotension, chest x-ray suggestive bronchopneumonia --4/17--BC 4/4 positive for strept species --4/19--repeat BC negative so far  NSTEMI --pt has no underlying cardiac hisotry (per records, family) --had troponin 88-- 856--3677--3299--2330--no cp --Cardiology consult with dr Cassie Freer.  --pt received  IV  heparin for 48 hours after discussing with GI Dr Maryjean Morn on ASA + plavix --Echo shows EF 60-70%  with mod to severe MR,TR, mild AR --cont statins, Imdur,toprol XL -- Cardiologist reviewed EKG, they did not think patient has A-fib.  Acute diastolic CHF Valvular regurgitation --IV lasix 20 mg bid --now d/ced --monitor creatinine--slight bump --input/output  Viral GE --GI PCR stool positive for Sapovirus --treat symptoms  Hypokalemia/hypomagnesimia --Pharmacy to replete electrolytes  Vomiting/hematemesis --serial hgbs table--Seen by GI dr Norma Fredrickson --IV ppi bid  Type II diabetes mellitus with renal manifestations CKD IIIB: Recent A1c 9.0.  Poorly controlled.  Patient is taking Jardiance at home -Will hold Jardiance due to suspicious allergic reaction (rash) -SSI --pt follows with Dr Wynelle Link as out pt --monitor creat   Anemia of chronic renal failure, stage 3b: Hgb 11.9 (12.4  on 08/22/22   Lower back pain acute on chornic -- patient rates are lower back pain as 3/10 at present. Discussed if she would like me to get lower lumbar spine MRI   Depression:  -Continue home medications   Rash: Etiology is not clear.  May be due to newly started Jardiance -[employer] --improved at lot with hydrocortisone  PT/OT to see pt. Given the severity of illness and significant deconditioning patient will benefit from rehab. TOC for discharge planning. Patient and daughter agreeable.     Procedures:none Family communication :dter Cala Bradford at bedside Consults :cardiology, ID, GI CODE STATUS: DNR/DNI DVT Prophylaxis :lovenox Level of care: Progressive Status is: Inpatient Remains inpatient appropriate because: sepsis, NSTEMI, CHF, GE    TOTAL  TIME TAKING CARE OF THIS PATIENT: 35 minutes.  >50% time spent on counselling and coordination of care  Note: This dictation was prepared with Dragon dictation along with smaller phrase technology. Any transcriptional errors that result from this process are unintentional.  Enedina Finner M.D    [facility]   CC: Primary care physician; [name], MD

## 2022-08-29 NOTE — Plan of Care (Signed)

## 2022-08-29 NOTE — Progress Notes (Signed)
SUBJECTIVE: Patient appears to be comfortable denies any chest pain or shortness of breath   Vitals:   08/29/22 0350 08/29/22 0500 08/29/22 0754 08/29/22 1127  BP: 116/60  (!) 141/66 (!) 152/71  Pulse: 78  77 75  Resp: Temp: 98.6 F (37 C)  (!) 95.2 F (35.1 C) 98.3 F (36.8 C)  TempSrc:      SpO2: 97%  (!) 88% 97%  Weight:  67.9 kg    Height:        Intake/Output Summary (Last 24 hours) at 08/29/2022 1136 Last data filed at 08/29/2022 0700 Gross per 24 hour  Intake 705.87 ml  Output 600 ml  Net 105.87 ml    LABS: Basic Metabolic Panel: Recent Labs    08/28/22 0214 08/29/22 1006  NA 132* 134*  K 3.5 3.6  CL 107 105  CO2 18* 19*  GLUCOSE 120* 99  BUN 27* 30*  CREATININE 1.62* 1.81*  CALCIUM 6.4* 6.4*  MG 2.3 1.8  PHOS 1.4* 2.6   Liver Function Tests: Recent Labs    08/28/22 0214 08/29/22 1006  ALBUMIN 2.7* 2.8*   No results for input(s): "LIPASE", "AMYLASE" in the last 72 hours. CBC: Recent Labs    08/28/22 0214 08/29/22 0523  WBC 14.1* 13.1*  HGB 10.0* 10.1*  HCT 29.0* 29.7*  MCV 90.1 90.3  PLT 165 194   Cardiac Enzymes: No results for input(s): "CKTOTAL", "CKMB", "CKMBINDEX", "TROPONINI" in the last 72 hours. BNP: Invalid input(s): "POCBNP" D-Dimer: Recent Labs    08/26/22 1200  DDIMER 1.78*   Hemoglobin A1C: No results for input(s): "HGBA1C" in the last 72 hours. Fasting Lipid Panel: Recent Labs    08/27/22 0426  CHOL 75  HDL 22*  LDLCALC 34  TRIG 97  CHOLHDL 3.4   Thyroid Function Tests: No results for input(s): "TSH", "T4TOTAL", "T3FREE", "THYROIDAB" in the last 72 hours.  Invalid input(s): "FREET3" Anemia Panel: No results for input(s): "VITAMINB12", "FOLATE", "FERRITIN", "TIBC", "IRON", "RETICCTPCT" in the last 72 hours.   PHYSICAL EXAM General: Well developed, well nourished, in no acute distress HEENT:  Normocephalic and atramatic Neck:  No JVD.  Lungs: Clear bilaterally to auscultation and  percussion. Heart: HRRR . Normal S1 and S2 without gallops or murmurs.  Abdomen: Bowel sounds are positive, abdomen soft and non-tender  Msk:  Back normal, normal gait. Normal strength and tone for age. Extremities: No clubbing, cyanosis or edema.   Neuro: Alert and oriented X 3. Psych:  Good affect, responds appropriately  TELEMETRY: Normal sinus rhythm  ASSESSMENT AND PLAN: Elevated troponin most likely due to non-STEMI and coronary artery diseas. No further documentation of atrial fibrillation on monitor and in setting of GI bleed no need for anticoagulation.  However due to non-STEMI continue DAPT with 81 mg aspirin and Plavix.  Also continue guideline directed medical therapy for coronary artery disease including metoprolol statin and isosorbide.  Cardiac point of view patient appears to be stable.   ICD-10-CM   1. Hematemesis with nausea  K92.0     2. New onset atrial fibrillation  I48.91     3. Hypertension, unspecified type  I10     4. Hypoxia  R09.02     5. AKI (acute kidney injury)  N17.9     6. Hypokalemia  E87.6     7. Bronchitis  J40       Principal Problem:   Hematemesis Active Problems:   Gout   Anemia of chronic renal  failure, stage 3b   Depression   Essential hypertension   Pneumonia due to infectious organism   NSTEMI (non-ST elevated myocardial infarction)   Hypertensive urgency   Hypokalemia   Hypocalcemia   Chronic diastolic CHF (congestive heart failure)   Acute blood loss anemia   Hypomagnesemia   Hypophosphatemia   Type II diabetes mellitus with renal manifestations   Rash   Chest pain   Sepsis due to Streptococcus species with acute hypoxic respiratory failure without septic shock   CHF NYHA class III, acute, diastolic    Tina Blackwater, MD, Tina Curtis [facility] 08/29/2022 11:36 AM

## 2022-08-29 NOTE — Progress Notes (Signed)
Physical Therapy Treatment Patient Details Name: Tina Curtis MRN: [id number] [name]: November 23, 1926 Today's Date: 08/29/2022   History of Present Illness presented to ER secondary to hematemesis, nausea/vomiting, diarrhea and chest pain; admitted for management of streptococcus bacteremia, possible PNA and NSTEMI (peak troponin 3677, managed medically)    PT Comments    Pt received in recliner after working with OT earlier. Concerns voiced regarding fear of incontinence with stool/urine. Pt educated on importance of mobility and role of PT. Despite incontinence, pt able to demonstrate ModA for sit to stand, gait training with RW, MinA, x 67ft on RA with SpO2 remaining in mid 90's, no SOB. Pt participated and tolerated session well today and will benefit from short term stay at SNF once medically cleared.   Recommendations for follow up therapy are one component of a multi-disciplinary discharge planning process, led by the attending physician.  Recommendations may be updated based on patient status, additional functional criteria and insurance authorization.  Follow Up Recommendations  Can patient physically be transported by private vehicle: No    Assistance Recommended at Discharge Frequent or constant Supervision/Assistance  Patient can return home with the following A lot of help with walking and/or transfers;A lot of help with bathing/dressing/bathroom   Equipment Recommendations  Other (comment) (Pt has multiple RW/Rollators at home)    Recommendations for Other Services       Precautions / Restrictions Precautions Precautions: Fall Restrictions Weight Bearing Restrictions: No     Mobility  Bed Mobility               General bed mobility comments:  (Pt received up in chair)    Transfers Overall transfer level: Needs assistance Equipment used: Rolling walker (2 wheels) Transfers: Sit to/from Stand Sit to Stand: Mod assist           General transfer comment:   (Vc's for proper technique, hand placement, and shifting weight forward)    Ambulation/Gait Ambulation/Gait assistance: Min assist Gait Distance (Feet): 12 Feet Assistive device: Rolling walker (2 wheels) Gait Pattern/deviations: Step-to pattern, Decreased step length - right, Decreased step length - left, Trunk flexed Gait velocity:  (decreased)     General Gait Details:  (followed behind with chair to decrease pt's anxiety level, no LOB, pt could have ambulated further - limited by lines/furniture.)   Stairs             Wheelchair Mobility    Modified Rankin (Stroke Patients Only)       Balance Overall balance assessment: Needs assistance Sitting-balance support: No upper extremity supported, Feet supported Sitting balance-Leahy Scale: Good     Standing balance support: Bilateral upper extremity supported, Reliant on assistive device for balance Standing balance-Leahy Scale: Fair Standing balance comment: No LOB, frequent vc's for upright posture                            Cognition Arousal/Alertness: Awake/alert Behavior During Therapy: WFL for tasks assessed/performed Overall Cognitive Status: Within Functional Limits for tasks assessed                                 General Comments:  (Improved tolerance this pm with less anxiety)        Exercises General Exercises - Lower Extremity Ankle Circles/Pumps: AROM, Both, 10 reps, Seated Long Arc Quad: AROM, Both, 10 reps, Seated Other Exercises Other Exercises:  (Education provided  to pt and daughter regarding role of PT, current goals, and d/c expectations)    General Comments General comments (skin integrity, edema, etc.):  (Pt on RA with SpO2 remaining in mid to upper 90's)      Pertinent Vitals/Pain Pain Assessment Pain Assessment: 0-10 Pain Score: 3  Pain Location: L lower back Pain Descriptors / Indicators: Grimacing, Moaning, Aching Pain Intervention(s): Limited  activity within patient's tolerance, Repositioned, Heat applied    Home Living Family/patient expects to be discharged to:: Private residence Living Arrangements: Alone Available Help at Discharge: Family;Available PRN/intermittently Type of Home: Independent living facility Home Access: Level entry       Home Layout: One level Home Equipment: Rollator (4 wheels);Rolling Walker (2 wheels)      Prior Function            PT Goals (current goals can now be found in the care plan section) Acute Rehab PT Goals Patient Stated Goal: to get stronger Progress towards PT goals: Progressing toward goals    Frequency    Min 3X/week      PT Plan      Co-evaluation              AM-PAC PT "6 Clicks" Mobility   Outcome Measure  Help needed turning from your back to your side while in a flat bed without using bedrails?: None Help needed moving from lying on your back to sitting on the side of a flat bed without using bedrails?: A Lot Help needed moving to and from a bed to a chair (including a wheelchair)?: A Lot Help needed standing up from a chair using your arms (e.g., wheelchair or bedside chair)?: A Lot Help needed to walk in hospital room?: A Lot Help needed climbing 3-5 steps with a railing? : Total 6 Click Score: 13    End of Session Equipment Utilized During Treatment: Gait belt Activity Tolerance: Patient tolerated treatment well Patient left: in chair;with call bell/phone within reach;with chair alarm set;with family/visitor present Nurse Communication: Mobility status PT Visit Diagnosis: Muscle weakness (generalized) (M62.81);Difficulty in walking, not elsewhere classified (R26.2)     Time: 1410-1434 PT Time Calculation (min) (ACUTE ONLY): 24 min  Charges:  $Therapeutic Exercise: 8-22 mins $Therapeutic Activity: 8-22 mins                    Zadie Cleverly, PTA  Jannet Askew 08/29/2022, 3:36 PM

## 2022-08-29 NOTE — Evaluation (Signed)
Occupational Therapy Evaluation Patient Details Name: Tina Curtis MRN: [id number] [name]: 30-Sep-1926 Today's Date: 08/29/2022   History of Present Illness presented to ER secondary to hematemesis, nausea/vomiting, diarrhea and chest pain; admitted for management of streptococcus bacteremia, possible PNA and NSTEMI (peak troponin 3677, managed medically)   Clinical Impression   Patient received for OT evaluation. See flowsheet below for details of function. Generally, patient requiring MIN A for bed mobility, MOD A-MIN A for functional mobility, and MIN A-MAX A for ADLs. Needing lots of reassurance today.  Very decreased activity tolerance. Patient will benefit from continued OT while in acute care.       Recommendations for follow up therapy are one component of a multi-disciplinary discharge planning process, led by the attending physician.  Recommendations may be updated based on patient status, additional functional criteria and insurance authorization.   Assistance Recommended at Discharge Frequent or constant Supervision/Assistance  Patient can return home with the following A lot of help with walking and/or transfers;A lot of help with bathing/dressing/bathroom;Assistance with cooking/housework;Direct supervision/assist for medications management;Direct supervision/assist for financial management;Assist for transportation;Help with stairs or ramp for entrance    Functional Status Assessment  Patient has had a recent decline in their functional status and demonstrates the ability to make significant improvements in function in a reasonable and predictable amount of time.  Equipment Recommendations  Other (comment) (defer to next venue of care)    Recommendations for Other Services       Precautions / Restrictions Precautions Precautions: Fall Restrictions Weight Bearing Restrictions: No      Mobility Bed Mobility Overal bed mobility: Needs Assistance Bed Mobility: Supine to  Sit     Supine to sit: Min assist     General bed mobility comments: Lots of encouragement and cues to use rails.    Transfers Overall transfer level: Needs assistance Equipment used: Rolling walker (2 wheels) Transfers: Sit to/from Stand, Bed to chair/wheelchair/BSC Sit to Stand: Mod assist     Step pivot transfers: Min assist     General transfer comment: with RW. Lots of cues for weight shifting forward. Very fatigued with activity.      Balance   Sitting-balance support: No upper extremity supported, Feet supported Sitting balance-Leahy Scale: Good     Standing balance support: Bilateral upper extremity supported, Reliant on assistive device for balance Standing balance-Leahy Scale: Poor                             ADL either performed or assessed with clinical judgement   ADL Overall ADL's : Needs assistance/impaired                         Toilet Transfer: Moderate assistance;Stand-pivot;BSC/3in1;Rolling walker (2 wheels) Toilet Transfer Details (indicate cue type and reason): Lots of cues for t/f to [facility] next to bed today. Toileting- Clothing Manipulation and Hygiene: Total assistance Toileting - Clothing Manipulation Details (indicate cue type and reason): OT assisting pt MIN A-CGA for standing balance; cues for transitioning hands to RW instead of leaving them on arm rests of BSC. RN provided dependent peri hygiene after toilet use.       General ADL Comments: Pt very limited by anxiety, embarassment, and pain today. Anticipate MAX A with dressing and bathing tasks at this time; likely set up for grooming and eating from seated only. Not safe for showering at this time.     Vision  Perception     Praxis      Pertinent Vitals/Pain Pain Assessment Pain Assessment: 0-10 Pain Score:  (unrated, high) Pain Location: L lower back Pain Descriptors / Indicators: Grimacing, Moaning, Aching Pain Intervention(s): Limited activity  within patient's tolerance, Monitored during session, Patient requesting pain meds-RN notified     Hand Dominance     Extremity/Trunk Assessment Upper Extremity Assessment Upper Extremity Assessment: Generalized weakness   Lower Extremity Assessment Lower Extremity Assessment: Generalized weakness       Communication Communication Communication: HOH   Cognition Arousal/Alertness: Awake/alert Behavior During Therapy: Flat affect Overall Cognitive Status: Within Functional Limits for tasks assessed                                 General Comments: Pt apologizing frequently for "causing a mess" (urinating on bed/floor); requiring a lot of reassurance that she isn't a burden to staff and we are glad to help her. Did well with encouragement. Needing reassurance that OT can assist her with transfer and we did not need to ask female staff memeber for assistance.     General Comments  Pt on 1L O2 at beginning of session, 94% O2. Pt t/f to [facility] and O2 tube not long enough; O2 removed and O2 sat remained 96%; 93% after t/f to chair; remained on room air at conclusion of session; RN aware. Pt with profuse urinary incontinence while standing.    Exercises     Shoulder Instructions      Home Living Family/patient expects to be discharged to:: Private residence Living Arrangements: Alone Available Help at Discharge: Family;Available PRN/intermittently Type of Home: Independent living facility Home Access: Level entry     Home Layout: One level     Bathroom Shower/Tub: Producer, television/film/video: Handicapped height     Home Equipment: Rollator (4 wheels);Rolling Walker (2 wheels)          Prior Functioning/Environment Prior Level of Function : Independent/Modified Independent;Driving             Mobility Comments: Mod indep with 3WRW in home, 4WRW in community; no falls; no home O2. + driving, completing all community errands indep ADLs Comments:  independent. Pt states she used to enjoy quilting (hand quilting), but due to macular degeneration doesn't do that anymore. 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        OT Problem List: Decreased strength;Decreased activity tolerance;Decreased range of motion;Impaired balance (sitting and/or standing);Decreased safety awareness;Decreased knowledge of use of DME or AE      OT Treatment/Interventions: Self-care/ADL training;Therapeutic exercise;Energy conservation;DME and/or AE instruction;Therapeutic activities;Patient/family education    OT Goals(Current goals can be found in the care plan section) Acute Rehab OT Goals Patient Stated Goal: Get better and back to what I was doing OT Goal Formulation: With patient/family Time For Goal Achievement: 09/12/22 Potential to Achieve Goals: Fair ADL Goals Pt Will Perform Grooming: with modified independence;standing Pt Will Perform Lower Body Bathing: with modified independence;sit to/from stand Pt Will Perform Lower Body Dressing: with modified independence;sit to/from stand Pt Will Transfer to Toilet: with modified independence;bedside commode;ambulating Pt Will Perform Toileting - Clothing Manipulation and hygiene: with modified independence;sit to/from stand;with adaptive equipment Pt Will Perform Tub/Shower Transfer: with supervision;ambulating;shower seat  OT Frequency: Min 2X/week    Co-evaluation              AM-PAC OT "6 Clicks" Daily Activity  Outcome Measure Help from another person eating meals?: None Help from another person taking care of personal grooming?: A Little Help from another person toileting, which includes using toliet, bedpan, or urinal?: A Lot Help from another person bathing (including washing, rinsing, drying)?: A Lot Help from another person to put on and taking off regular upper body clothing?: A Little Help from another person to put on and taking off regular lower body clothing?: Total 6  Click Score: 15   End of Session Equipment Utilized During Treatment: Rolling walker (2 wheels);Oxygen;Other (comment) [facility]) Nurse Communication: Mobility status;Patient requests pain meds  Activity Tolerance: Patient limited by fatigue;Patient limited by pain Patient left: in chair;with call bell/phone within reach;with chair alarm set;with nursing/sitter in room;with family/visitor present  OT Visit Diagnosis: Unsteadiness on feet (R26.81);Muscle weakness (generalized) (M62.81)                Time: 1020-1050 OT Time Calculation (min): 30 min Charges:  OT General Charges $OT Visit: 1 Visit OT Evaluation $OT Eval Moderate Complexity: 1 Mod OT Treatments $Self Care/Home Management : 8-22 mins  Linward Foster, MS, OTR/L  Alvester Morin 08/29/2022, 1:25 PM

## 2022-08-29 NOTE — Progress Notes (Signed)
PHARMACIST - PHYSICIAN COMMUNICATION  CONCERNING:  Enoxaparin (Lovenox) for DVT Prophylaxis    RECOMMENDATION: Patient was prescribed enoxaprin  q24 hours for VTE prophylaxis.   Filed Weights   08/26/22 0515 08/28/22 0500 08/29/22 0500  Weight: 60 kg (132 lb 4.4 oz) 65.8 kg (145 lb 1 oz) 67.9 kg (149 lb 11.1 oz)    Body mass index is 25.69 kg/m.  Estimated Creatinine Clearance: 18.4 mL/min (A) (by C-G formula based on SCr of 1.81 mg/dL (H)).   Patient is candidate for enoxaparin  every 24 hours based on CrCl <99ml/min or Weight <45kg  DESCRIPTION: Pharmacy has adjusted enoxaparin dose per [facility] policy.  Patient is now receiving enoxaparin 30 mg every 24 hours    Barrie Folk, PharmD Clinical Pharmacist  08/29/2022 3:10 PM

## 2022-08-29 NOTE — Progress Notes (Signed)
10:52 Lab - quenisha(sp?) Critical Calcium 6.4.  Dr. Allena Katz notified.

## 2022-08-29 NOTE — Progress Notes (Signed)
ANTICOAGULATION CONSULT NOTE  Pharmacy Consult for Heparin Infusion Indication: NSTEMI  Allergies  Allergen Reactions   Penicillins Anaphylaxis   Drug Ingredient [Zinc]     Patient Measurements: Height:  (162.6 cm) Weight: 67.9 kg (149 lb 11.1 oz) IBW/kg (Calculated) : 54.7 Heparin Dosing Weight: 60 kg  Vital Signs: Temp: 98.6 F (37 C) (04/20 0350) BP: 116/60 (04/20 0350) Pulse Rate: 78 (04/20 0350)  Labs: Recent Labs    08/26/22 1835 08/27/22 0426 08/27/22 0742 08/27/22 1019 08/27/22 1257 08/27/22 1724 08/27/22 1815 08/28/22 0214 08/28/22 1128 08/28/22 1954 08/29/22 0523  HGB 10.6* 10.1* 10.3*  --   --   --   --  10.0*  --   --  10.1*  HCT 31.4* 29.9* 31.5*  --   --   --   --  29.0*  --   --  29.7*  PLT 166 154 154  --   --   --   --  165  --   --  194  HEPARINUNFRC  --   --   --   --   --   --    < > 0.28* 0.45 0.39 0.40  CREATININE 1.48* 1.45*  --   --   --   --   --  1.62*  --   --   --   TROPONINIHS  --  0,981*  --  3,299* 2,667* 2,308*  --   --   --   --   --    < > = values in this interval not displayed.     Estimated Creatinine Clearance: 20.6 mL/min (A) (by C-G formula based on SCr of 1.62 mg/dL (H)).   Medical History: Past Medical History:  Diagnosis Date   Arthritis    Gout   Chronic kidney disease    Diarrhea    Hypercholesteremia    Hypertension    Neuropathy    feet and lower legs   Seasonal allergies    Skin cancer    face    Medications:  Scheduled:   allopurinol  100 mg Oral Daily   anastrozole  1 mg Oral Daily   aspirin  81 mg Oral Daily   atorvastatin  40 mg Oral Daily   cholecalciferol  2,000 Units Oral Daily   citalopram  10 mg Oral Daily   clopidogrel  75 mg Oral Daily   cyanocobalamin  1,000 mcg Oral Daily   DULoxetine  60 mg Oral Daily   furosemide  20 mg Intravenous BID   gabapentin  300 mg Oral QHS   hydrALAZINE  25 mg Oral TID   hydrocortisone cream   Topical BID   insulin aspart  0-5 Units  Subcutaneous QHS   insulin aspart  0-9 Units Subcutaneous TID WC   isosorbide mononitrate  30 mg Oral Daily   metoprolol succinate  37.5 mg Oral QHS   pantoprazole (PROTONIX) IV  40 mg Intravenous Q12H   Infusions:   azithromycin Stopped (08/28/22 2250)   cefTRIAXone (ROCEPHIN)  IV Stopped (08/28/22 1244)   heparin 850 Units/hr (08/29/22 0558)   PRN: acetaminophen, albuterol, dextromethorphan-guaiFENesin, docusate sodium, hydrALAZINE, loratadine, morphine injection, nitroGLYCERIN, ondansetron (ZOFRAN) IV, traMADol  Assessment: Tina Curtis is a 89 y.o. female presenting with hematemesis, nausea, vomiting, diarrhea and chest pain. PMH significant for HTN, HLD, dCHF, gout, depression, CKD3B, neuropathy, breast cancer. Patient was not on [facility] PTA per chart review. hsTrop 856 >> [id number]. Cardiology and GI consulted. GI has cleared patient  to receive heparin infusion. Pharmacy has been consulted to initiate and manage heparin infusion.   Baseline Labs: aPTT 33, PT 18.1, INR 1.5, Hgb 10.3, Hct 35.1, Plt 154   Goal of Therapy:  Heparin level 0.3-0.7 units/ml aPTT 66-102 seconds Monitor platelets by anticoagulation protocol: Yes   Date Time HL Rate/Comment  4/18 1815 0.30 700/therapeutic x 1  4/19 0214 0.28 700/subtherapeutic 4/19 1128 0.45 850/therapeutic x 1 4/19 1954 0.39 850/therapeutic x 2 4/20 0523 0.40 850/therapeutic x 3   Plan:   Continue heparin infusion at 850 units/hr Check HL daily while on heparin infusion Continue to monitor H&H and platelets daily while on heparin infusion   Otelia Sergeant, PharmD, [facility] 08/29/2022 6:43 AM

## 2022-08-30 DIAGNOSIS — J189 Pneumonia, unspecified organism: Secondary | ICD-10-CM | POA: Diagnosis not present

## 2022-08-30 DIAGNOSIS — I214 Non-ST elevation (NSTEMI) myocardial infarction: Secondary | ICD-10-CM | POA: Diagnosis not present

## 2022-08-30 DIAGNOSIS — A409 Streptococcal sepsis, unspecified: Secondary | ICD-10-CM | POA: Diagnosis not present

## 2022-08-30 DIAGNOSIS — I5033 Acute on chronic diastolic (congestive) heart failure: Secondary | ICD-10-CM | POA: Diagnosis not present

## 2022-08-30 LAB — GLUCOSE, CAPILLARY
Glucose-Capillary: 87 mg/dL (ref 70–99)
Glucose-Capillary: 161 mg/dL — ABNORMAL HIGH (ref 70–99)
Glucose-Capillary: 133 mg/dL — ABNORMAL HIGH (ref 70–99)
Glucose-Capillary: 130 mg/dL — ABNORMAL HIGH (ref 70–99)

## 2022-08-30 LAB — BASIC METABOLIC PANEL
Anion gap: 10 (ref 5–15)
BUN: 28 mg/dL — ABNORMAL HIGH (ref 8–23)
CO2: 19 mmol/L — ABNORMAL LOW (ref 22–32)
Calcium: 6.8 mg/dL — ABNORMAL LOW (ref 8.9–10.3)
Chloride: 105 mmol/L (ref 98–111)
Creatinine, Ser: 1.76 mg/dL — ABNORMAL HIGH (ref 0.44–1.00)
GFR, Estimated: 27 mL/min — ABNORMAL LOW (ref >60)
Glucose, Bld: 102 mg/dL — ABNORMAL HIGH (ref 70–99)
Potassium: 3 mmol/L — ABNORMAL LOW (ref 3.5–5.1)
Sodium: 134 mmol/L — ABNORMAL LOW (ref 135–145)

## 2022-08-30 LAB — CREATININE, SERUM
Creatinine, Ser: 1.78 mg/dL — ABNORMAL HIGH (ref 0.44–1.00)
GFR, Estimated: 26 mL/min — ABNORMAL LOW (ref >60)

## 2022-08-30 LAB — MAGNESIUM: Magnesium: 2 mg/dL (ref 1.7–2.4)

## 2022-08-30 LAB — PHOSPHORUS
Phosphorus: 1.7 mg/dL — ABNORMAL LOW (ref 2.5–4.6)
Phosphorus: 2.2 mg/dL — ABNORMAL LOW (ref 2.5–4.6)

## 2022-08-30 LAB — POTASSIUM: Potassium: 3.4 mmol/L — ABNORMAL LOW (ref 3.5–5.1)

## 2022-08-30 LAB — CULTURE, BLOOD (ROUTINE X 2): Culture: NO GROWTH

## 2022-08-30 MED ORDER — POTASSIUM CHLORIDE CRYS ER 20 MEQ PO TBCR
40.0000 meq | EXTENDED_RELEASE_TABLET | Freq: Once | ORAL | Status: AC
  Administered 2022-08-30: 40 meq via ORAL
  Filled 2022-08-30: qty 2

## 2022-08-30 MED ORDER — TRAZODONE HCL 50 MG PO TABS
25.0000 mg | ORAL_TABLET | Freq: Every day | ORAL | Status: DC
  Administered 2022-08-30 – 2022-09-01 (×3): 25 mg via ORAL
  Filled 2022-08-30 (×3): qty 1

## 2022-08-30 MED ORDER — CALCIUM GLUCONATE-NACL 1-0.675 GM/50ML-% IV SOLN: 1.0000 g | Freq: Once | INTRAVENOUS | Status: DC

## 2022-08-30 MED ORDER — POTASSIUM PHOSPHATES 15 MMOLE/5ML IV SOLN
20.0000 mmol | Freq: Once | INTRAVENOUS | Status: AC
  Administered 2022-08-30: 20 mmol via INTRAVENOUS
  Filled 2022-08-30: qty 6.67

## 2022-08-30 MED ORDER — PROSIGHT PO TABS
1.0000 | ORAL_TABLET | Freq: Every day | ORAL | Status: DC
  Administered 2022-08-30 – 2022-09-02 (×3): 1 via ORAL
  Filled 2022-08-30 (×3): qty 1

## 2022-08-30 MED ORDER — CALCIUM GLUCONATE-NACL 2-0.675 GM/100ML-% IV SOLN
2.0000 g | Freq: Once | INTRAVENOUS | Status: AC
  Administered 2022-08-30: 2000 mg via INTRAVENOUS
  Filled 2022-08-30: qty 100

## 2022-08-30 MED ORDER — POTASSIUM CHLORIDE CRYS ER 20 MEQ PO TBCR
20.0000 meq | EXTENDED_RELEASE_TABLET | Freq: Once | ORAL | Status: AC
  Administered 2022-08-30: 20 meq via ORAL
  Filled 2022-08-30: qty 1

## 2022-08-30 NOTE — TOC Initial Note (Signed)
Transition of Care [facility]) - Initial/Assessment Note    Patient Details  Name: Tina Curtis MRN: [id number] Date of Birth: [birth year]  Transition of Care [facility]) CM/SW Contact:    Luvenia Redden, RN Phone Number: [phone] 08/30/2022, 3:27 PM  Clinical Narrative:                 1:47 PM Spoke with the pt who permitted [facility] RN to speak with her daughter [name]. Discussed the recommendations for SNF as pt is a long term resident of [address] and wishes to return at their SNF level of care.  Pt lives alone in the independent part of [place]. There are two daughter however both live out of town with only a local cousin to visit pt if needed. Daughter reports pt has a walker and able to obtain her medications and transportation needs through the facility [facility] if needed.   3:33 PM Sue Lush with [facility] updated on pending NAVI with [facility] and the request for a SNF bed for this pt.   Expected Discharge Plan: Skilled Nursing Facility Barriers to Discharge: Continued Medical Work up   Patient Goals and CMS Choice     Choice offered to / list presented to : Patient, Adult Children      Expected Discharge Plan and Services   Discharge Planning Services: CM Consult   Living arrangements for the past 2 months: Independent Living Facility [facility])                                      Prior Living Arrangements/Services Living arrangements for the past 2 months: Independent Living Facility ([facility]) Lives with:: Self Patient language and need for interpreter reviewed:: Yes Do you feel safe going back to the place where you live?: Yes      Need for Family Participation in Patient Care: Yes (Comment) Care giver support system in place?: Yes (comment)   Criminal Activity/Legal Involvement Pertinent to Current Situation/Hospitalization: No - Comment as needed  Activities of Daily Living Home Assistive Devices/Equipment: Walker (specify type) ADL Screening  (condition at time of admission) Patient's cognitive ability adequate to safely complete daily activities?: Yes Is the patient deaf or have difficulty hearing?: No Does the patient have difficulty seeing, even when wearing glasses/contacts?: No Does the patient have difficulty concentrating, remembering, or making decisions?: No Patient able to express need for assistance with ADLs?: Yes Does the patient have difficulty dressing or bathing?: No Independently performs ADLs?: Yes (appropriate for developmental age) Does the patient have difficulty walking or climbing stairs?: Yes Weakness of Legs: Both Weakness of Arms/Hands: Both  Permission Sought/Granted   Permission granted to share information with : Yes, Release of Information Signed  Share Information with NAME: Milika Ventress     Permission granted to share info w Relationship: daughter     Emotional Assessment Appearance:: Appears stated age Attitude/Demeanor/Rapport: Engaged Affect (typically observed): Accepting Orientation: : Oriented to Self, Oriented to  Time, Oriented to Situation, Oriented to Place   Psych Involvement: No (comment)  Admission diagnosis:  Hematemesis [K92.0] Hypokalemia [E87.6] Bronchitis [J40] Hypoxia [R09.02] New onset atrial fibrillation [I48.91] AKI (acute kidney injury) [N17.9] Hematemesis with nausea [K92.0] Hypertension, unspecified type [I10] Patient Active Problem List   Diagnosis Date Noted   Sepsis due to Streptococcus species with acute hypoxic respiratory failure without septic shock 08/27/2022   CHF NYHA class III, acute,  diastolic 08/27/2022   Hematemesis 08/26/2022   NSTEMI (non-ST elevated myocardial infarction) 08/26/2022   Hypertensive urgency 08/26/2022   Hypokalemia 08/26/2022   Hypocalcemia 08/26/2022   Chronic diastolic CHF (congestive heart failure) 08/26/2022   Acute blood loss anemia 08/26/2022   Hypomagnesemia 08/26/2022   Hypophosphatemia 08/26/2022   Type II  diabetes mellitus with renal manifestations 08/26/2022   Rash 08/26/2022   Chest pain 08/26/2022   Acute on chronic respiratory failure with hypoxia 06/10/2022   Emphysema lung 06/10/2022   Pneumonia due to infectious organism 06/07/2022   Postural dizziness with presyncope 06/06/2022   History of breast cancer 06/06/2022   Depression 06/06/2022   Essential hypertension 06/06/2022   Orthostatic hypotension 06/06/2022   Dehydration 06/06/2022   Dizziness and giddiness 06/02/2022   Osteoporosis of forearm 03/28/2020   Breast cancer 10/13/2019   Carcinoma of upper-inner quadrant of right breast in female, estrogen receptor positive 09/27/2019   Anemia in chronic kidney disease 02/01/2019   Atherosclerosis of renal artery 02/01/2019   Benign hypertensive kidney disease with chronic kidney disease 02/01/2019   Hyposmolality and/or hyponatremia 02/01/2019   Proteinuria 02/01/2019   Secondary hyperparathyroidism of renal origin 02/01/2019   Anemia of chronic renal failure, stage 3b 04/01/2018   Renovascular hypertension 04/01/2018   Renal artery stenosis 04/01/2018   Nausea & vomiting 03/05/2018   AKI (acute kidney injury) 02/17/2018   Acquired hammer toe of right foot 08/14/2014   Bunion of right foot 08/14/2014   Gout 08/14/2014   History of Clostridium difficile colitis 08/14/2014   Pure hypercholesterolemia 08/14/2014   RLS (restless legs syndrome) 08/14/2014   Hallux rigidus of right foot 04/24/2014   Primary osteoarthritis of foot 09/01/2013   Fatigue 09/30/2012   Gluteus medius or minimus syndrome 08/23/2012   Trochanteric bursitis 08/12/2012   Benign essential hypertension 09/03/2011   Diverticulosis of colon 09/03/2011   Osteopenia 09/03/2011   Enthesopathy of ankle and tarsus 04/13/2011   Osteoarthritis of spine with radiculopathy, lumbosacral region 09/30/2010   Allergic rhinitis 10/31/1926   PCP:  [name], MD Pharmacy:   [id number] [place], [place] - [address] AT [facility] [address] [facility] [address] [place] [address] Phone: [phone] Fax: [phone]     Social Determinants of Health (SDOH) Social History: SDOH Screenings   Food Insecurity: No Food Insecurity (08/27/2022)  Housing: Low Risk  (08/27/2022)  Transportation Needs: No Transportation Needs (08/27/2022)  Utilities: Not At Risk (08/27/2022)  Tobacco Use: Low Risk  (08/27/2022)   SDOH Interventions:     Readmission Risk Interventions    06/08/2022    3:30 PM  Readmission Risk Prevention Plan  Transportation Screening Complete  Palliative Care Screening Not Applicable  Medication Review (RN Care Manager) Complete

## 2022-08-30 NOTE — Plan of Care (Signed)

## 2022-08-30 NOTE — Progress Notes (Addendum)
PHARMACY CONSULT NOTE - FOLLOW UP  Pharmacy Consult for Electrolyte Monitoring and Replacement   Recent Labs: Potassium (mmol/L)  Date Value  08/30/2022 3.0 (L)   Magnesium (mg/dL)  Date Value  09/81/1914 2.0   Calcium (mg/dL)  Date Value  78/29/5621 6.8 (L)   Albumin (g/dL)  Date Value  30/86/5784 2.8 (L)   Phosphorus (mg/dL)  Date Value  69/62/9528 1.7 (L)   Sodium (mmol/L)  Date Value  08/30/2022 134 (L)     Assessment: 89 yo F w/ GIB, viral gastroenteritis, Strep bacteremia, ?PNA   Goal of Therapy:  Electrolytes WNL  Plan:  K 3.0- will order  KCL 40 meq PO x 1 (and ~30 meqKCL in Kphos) Phos 1.7- will order Kphos 20 mmol IV x1 Ca 6.8 Alb 2.8  Corr Ca 7.8- will order Calcium gluconate 2 gm IV x 1 to be given after Kphos finished. (Started on oral Calcium  elemental bid on 4/20) Recheck K/Phos/Scr at 1800 Recheck BMP, Mag and Phos with  AM labs.  Angelique Blonder ,PharmD Clinical Pharmacist 08/30/2022 7:39 AM

## 2022-08-30 NOTE — Progress Notes (Signed)
PHARMACY CONSULT NOTE - FOLLOW UP  Pharmacy Consult for Electrolyte Monitoring and Replacement   Recent Labs: Potassium (mmol/L)  Date Value  08/30/2022 3.4 (L)   Magnesium (mg/dL)  Date Value  16/02/9603 2.0   Calcium (mg/dL)  Date Value  54/01/8118 6.8 (L)   Albumin (g/dL)  Date Value  14/78/2956 2.8 (L)   Phosphorus (mg/dL)  Date Value  21/30/8657 2.2 (L)   Sodium (mmol/L)  Date Value  08/30/2022 134 (L)     Assessment: 89 yo F w/ GIB, viral gastroenteritis, Strep bacteremia, ?PNA   Goal of Therapy:  Electrolytes WNL  Plan:  Kphos finished around 1600, will recheck Phos tomorrow with AM labs K+ 3.4, will give Kcl 20 mEq po x 1 Recheck electrolytes with AM labs   Barrie Folk ,PharmD Clinical Pharmacist 08/30/2022 6:19 PM

## 2022-08-30 NOTE — Progress Notes (Signed)
[facility]  - [place] at [facility]   PATIENT NAME: Tina Curtis    MR#:  [id number]  DATE OF BIRTH:  11-Aug-1926  SUBJECTIVE:  patient sitting out in the chair. Ate some fruit and drank coffee for breakfast. No family at bedside earlier . Denies any back pain today. Rash appears stable no fever.   VITALS:  Blood pressure (!) 151/65, pulse 79, temperature 98.5 F (36.9 C), resp. rate 15, height  (1.626 m), weight 67.9 kg, SpO2 98 %.  PHYSICAL EXAMINATION:   GENERAL:  89 y.o.-year-old patient with no acute distress. Weak, deconditioned LUNGS: decreased breath sounds bilaterally, no wheezing CARDIOVASCULAR: S1, S2 normal. No murmur  tachycardia ABDOMEN: Soft, nontender, nondistended. Bowel sounds present.  EXTREMITIES:trace  edema b/l.    NEUROLOGIC: nonfocal  patient is alert and awake SKIN erythematous rash on right arm, chest and flank-- rash fading  LABORATORY PANEL:  CBC Recent Labs  Lab 08/29/22 0523  WBC 13.1*  HGB 10.1*  HCT 29.7*  PLT 194     Chemistries  Recent Labs  Lab 08/26/22 0526 08/26/22 1835 08/30/22 0437  NA 139   < > 134*  K 2.5*   < > 3.0*  CL 106   < > 105  CO2 21*   < > 19*  GLUCOSE 182*   < > 102*  BUN 21   < > 28*  CREATININE 1.34*   < > 1.76*  CALCIUM 7.3*   < > 6.8*  MG 1.4*   < > 2.0  AST 56*  --   --   ALT 35  --   --   ALKPHOS 72  --   --   BILITOT 1.1  --   --    < > = values in this interval not displayed.    Assessment and Plan  Tina Curtis is a 89 y.o. female with medical history significant of hypertension, hyperlipidemia, dCHF, gout, depression, CKD-3B, neuropathy, C. difficile, breast cancer, who presents with hematemesis, nausea, vomiting, diarrhea and chest pain.  4/13, and had CTA of chest/abdomen/pelvis and was diagnosed with gastroenteritis.   Patient is normally not using oxygen, but was found to have oxygen desaturation to 87-89% on room air, which improved to 90-92% on 2 L oxygen. Of note,  pt has rash in her left chest wall and left upper arm, not itchy or painful.   CXR: 1. The appearance of the chest is concerning for probable acute bronchitis, as above. Some degree of underlying interstitial lung disease is also suspected. 2. Aortic atherosclerosis  US venous doppler 1. No lower extremity DVT. 2. 5 cm left Baker's cyst.  NM perfusion study: Indetermidate  Sepsis due to Acute bronchopneumonia Streptococcal G Bacterimia --IV rocephin + zithromax --ID consultation with Dr Rivka Safer -- patient came in with fever of 103, tachycardia, tachypnea elevated white count and hypotension, chest x-ray suggestive bronchopneumonia --4/17--BC 4/4 positive for strept species --4/19--repeat BC negative so far  NSTEMI --pt has no underlying cardiac hisotry (per records, family) --had troponin 88-- 856--3677--3299--2330--no cp --Cardiology consult with dr Cassie Freer.  --pt received  IV  heparin for 48 hours after discussing with GI Dr Maryjean Morn on ASA + plavix --Echo shows EF 60-70%  with mod to severe MR,TR, mild AR --cont statins, Imdur,toprol XL -- Cardiologist reviewed EKG, they did not think patient has A-fib.   Acute diastolic CHF Valvular regurgitation --IV lasix 20 mg bid --now d/ced --monitor creatinine--slight bump --input/output  Viral GE --GI  PCR stool positive for Sapovirus --treat symptoms  Hypokalemia/hypomagnesimia/low phosphorus, hypocalcemia --Pharmacy to replete electrolytes  Vomiting/hematemesis --serial hgbs table--Seen by GI dr Norma Fredrickson --IV ppi bid--change to po  Type II diabetes mellitus with renal manifestations CKD IIIB: Recent A1c 9.0.  Poorly controlled.  Patient is taking Jardiance at home -Will hold Jardiance due to suspicious allergic reaction (rash) -SSI --pt follows with Dr Wynelle Link as out pt --monitor creat   Anemia of chronic renal failure, stage 3b: Hgb 11.9 (12.4 on 08/22/22   Lower back pain acute on chornic -- patient rates are  lower back pain as 3/10 at present. Discussed if she would like me to get lower lumbar spine MRI   Depression:  -Continue home medications   Rash: Etiology is not clear.  May be due to newly started Jardiance -[employer] --improved a lot with hydrocortisone  PT/OT to see pt. Given the severity of illness and significant deconditioning patient will benefit from rehab. TOC for discharge planning. Patient and daughter agreeable.     Procedures:none Family communication :dter Cala Bradford on phone Consults :cardiology, ID, GI CODE STATUS: DNR/DNI DVT Prophylaxis :lovenox Level of care: Progressive Status is: Inpatient Remains inpatient appropriate because: sepsis, NSTEMI, CHF, GE    TOTAL  TIME TAKING CARE OF THIS PATIENT: 35 minutes.  >50% time spent on counselling and coordination of care  Note: This dictation was prepared with Dragon dictation along with smaller phrase technology. Any transcriptional errors that result from this process are unintentional.  Enedina Finner M.D    [facility]   CC: Primary care physician; [name], MD

## 2022-08-30 NOTE — [place] FL2 (Signed)
[place] MEDICAID FL2 LEVEL OF CARE FORM     IDENTIFICATION  Patient Name: Tina Curtis Birthdate: [birth year] Sex: female Admission Date (Current Location): 08/26/2022  [facility] and [place] Number:  Chiropodist and Address:  [facility], [address] [place], [place] [address]      Provider Number: [id number]  Attending Physician Name and Address:  Enedina Finner, MD  Relative Name and Phone Number:  Raushanah Osmundson [phone]    Current Level of Care: Hospital Recommended Level of Care: Skilled Nursing Facility Prior Approval Number:    Date Approved/Denied: 08/30/22 PASRR Number: [id number]  Discharge Plan: SNF    Current Diagnoses: Patient Active Problem List   Diagnosis Date Noted   Sepsis due to Streptococcus species with acute hypoxic respiratory failure without septic shock 08/27/2022   CHF NYHA class III, acute, diastolic 08/27/2022   Hematemesis 08/26/2022   NSTEMI (non-ST elevated myocardial infarction) 08/26/2022   Hypertensive urgency 08/26/2022   Hypokalemia 08/26/2022   Hypocalcemia 08/26/2022   Chronic diastolic CHF (congestive heart failure) 08/26/2022   Acute blood loss anemia 08/26/2022   Hypomagnesemia 08/26/2022   Hypophosphatemia 08/26/2022   Type II diabetes mellitus with renal manifestations 08/26/2022   Rash 08/26/2022   Chest pain 08/26/2022   Acute on chronic respiratory failure with hypoxia 06/10/2022   Emphysema lung 06/10/2022   Pneumonia due to infectious organism 06/07/2022   Postural dizziness with presyncope 06/06/2022   History of breast cancer 06/06/2022   Depression 06/06/2022   Essential hypertension 06/06/2022   Orthostatic hypotension 06/06/2022   Dehydration 06/06/2022   Dizziness and giddiness 06/02/2022   Osteoporosis of forearm 03/28/2020   Breast cancer 10/13/2019   Carcinoma of upper-inner quadrant of right breast in female, estrogen receptor positive 09/27/2019   Anemia in  chronic kidney disease 02/01/2019   Atherosclerosis of renal artery 02/01/2019   Benign hypertensive kidney disease with chronic kidney disease 02/01/2019   Hyposmolality and/or hyponatremia 02/01/2019   Proteinuria 02/01/2019   Secondary hyperparathyroidism of renal origin 02/01/2019   Anemia of chronic renal failure, stage 3b 04/01/2018   Renovascular hypertension 04/01/2018   Renal artery stenosis 04/01/2018   Nausea & vomiting 03/05/2018   AKI (acute kidney injury) 02/17/2018   Acquired hammer toe of right foot 08/14/2014   Bunion of right foot 08/14/2014   Gout 08/14/2014   History of Clostridium difficile colitis 08/14/2014   Pure hypercholesterolemia 08/14/2014   RLS (restless legs syndrome) 08/14/2014   Hallux rigidus of right foot 04/24/2014   Primary osteoarthritis of foot 09/01/2013   Fatigue 09/30/2012   Gluteus medius or minimus syndrome 08/23/2012   Trochanteric bursitis 08/12/2012   Benign essential hypertension 09/03/2011   Diverticulosis of colon 09/03/2011   Osteopenia 09/03/2011   Enthesopathy of ankle and tarsus 04/13/2011   Osteoarthritis of spine with radiculopathy, lumbosacral region 09/30/2010   Allergic rhinitis 10/[birth year]/1928    Orientation RESPIRATION BLADDER Height & Weight     Self, Time, Situation, Place  Normal Incontinent Weight: 67.9 kg Height:   (162.6 cm)  BEHAVIORAL SYMPTOMS/MOOD NEUROLOGICAL BOWEL NUTRITION STATUS      Incontinent Diet  AMBULATORY STATUS COMMUNICATION OF NEEDS Skin   Limited Assist Verbally Normal                       Personal Care Assistance Level of Assistance  Bathing, Dressing, Feeding Bathing Assistance: Limited assistance Feeding assistance: Limited assistance Dressing Assistance: Limited assistance     Functional  Limitations Info             SPECIAL CARE FACTORS FREQUENCY  PT (By licensed PT), OT (By licensed OT)     PT Frequency: weekly X 5 OT Frequency: weekly X 5             Contractures Contractures Info: Not present    Additional Factors Info                  Current Medications (08/30/2022):  This is the current hospital active medication list Current Facility-Administered Medications  Medication Dose Route Frequency Provider Last Rate Last Admin   acetaminophen (TYLENOL) tablet 650 mg  650 mg Oral Q6H PRN Lorretta Harp, MD   650 mg at 08/27/22 2318   albuterol (PROVENTIL) (2.5 MG/3ML) 0.083% nebulizer solution 3 mL  3 mL Inhalation Q4H PRN Lorretta Harp, MD       allopurinol (ZYLOPRIM) tablet 100 mg  100 mg Oral Daily Lorretta Harp, MD   100 mg at 08/30/22 4098   anastrozole (ARIMIDEX) tablet 1 mg  1 mg Oral Daily Lorretta Harp, MD   1 mg at 08/30/22 0941   aspirin chewable tablet 81 mg  81 mg Oral Daily Rebeca Allegra, PA-C   81 mg at 08/30/22 1191   atorvastatin (LIPITOR) tablet 40 mg  40 mg Oral Daily Lorretta Harp, MD   40 mg at 08/30/22 0949   azithromycin (ZITHROMAX) tablet 250 mg  250 mg Oral Daily Enedina Finner, MD   250 mg at 08/30/22 4782   calcium carbonate (OS-CAL - dosed in mg of elemental calcium) tablet 2,500 mg  1,000 mg of elemental calcium Oral BID WC Enedina Finner, MD   2,500 mg at 08/30/22 0940   calcium gluconate 2 g/ 100 mL sodium chloride IVPB  2 g Intravenous Once Angelique Blonder, RPH       cefTRIAXone (ROCEPHIN) 2 g in sodium chloride 0.9 % 100 mL IVPB  2 g Intravenous Q24H Enedina Finner, MD 200 mL/hr at 08/30/22 1000 2 g at 08/30/22 1000   cholecalciferol (VITAMIN D3) 25 MCG (1000 UNIT) tablet 2,000 Units  2,000 Units Oral Daily Lorretta Harp, MD   2,000 Units at 08/30/22 0939   citalopram (CELEXA) tablet 10 mg  10 mg Oral Daily Lorretta Harp, MD   10 mg at 08/30/22 9562   clopidogrel (PLAVIX) tablet 75 mg  75 mg Oral Daily Rebeca Allegra, PA-C   75 mg at 08/30/22 1308   cyanocobalamin (VITAMIN B12) tablet 1,000 mcg  1,000 mcg Oral Daily Lorretta Harp, MD   1,000 mcg at 08/30/22 6578   dextromethorphan-guaiFENesin (MUCINEX DM) 30-600 MG per  12 hr tablet 1 tablet  1 tablet Oral BID PRN Lorretta Harp, MD       docusate sodium (COLACE) capsule 100 mg  100 mg Oral Daily PRN Lorretta Harp, MD       DULoxetine (CYMBALTA) DR capsule 60 mg  60 mg Oral Daily Lorretta Harp, MD   60 mg at 08/30/22 0939   enoxaparin (LOVENOX) injection 30 mg  30 mg Subcutaneous Q24H Enedina Finner, MD   30 mg at 08/29/22 1652   gabapentin (NEURONTIN) capsule 300 mg  300 mg Oral QHS Lorretta Harp, MD   300 mg at 08/29/22 2214   hydrALAZINE (APRESOLINE) tablet 25 mg  25 mg Oral TID Lorretta Harp, MD   25 mg at 08/30/22 4696   hydrocortisone cream 0.5 %   Topical BID Enedina Finner, MD  1 Application at 08/30/22 1004   insulin aspart (novoLOG) injection 0-5 Units  0-5 Units Subcutaneous QHS Lorretta Harp, MD       insulin aspart (novoLOG) injection 0-9 Units  0-9 Units Subcutaneous TID WC Lorretta Harp, MD   2 Units at 08/30/22 1243   isosorbide mononitrate (IMDUR) 24 hr tablet 30 mg  30 mg Oral Daily Rebeca Allegra, PA-C   30 mg at 08/30/22 1610   loratadine (CLARITIN) tablet 10 mg  10 mg Oral Daily PRN Lorretta Harp, MD       metoprolol succinate (TOPROL-XL) 24 hr tablet 37.5 mg  37.5 mg Oral QHS Lorretta Harp, MD   37.5 mg at 08/29/22 2214   morphine (PF) 2 MG/ML injection 0.5 mg  0.5 mg Intravenous Q4H PRN Lorretta Harp, MD       nitroGLYCERIN (NITROSTAT) SL tablet 0.4 mg  0.4 mg Sublingual Q5 min PRN Lorretta Harp, MD       ondansetron [facility]) injection 4 mg  4 mg Intravenous Q8H PRN Lorretta Harp, MD   4 mg at 08/29/22 1103   pantoprazole (PROTONIX) EC tablet 40 mg  40 mg Oral Daily Enedina Finner, MD   40 mg at 08/30/22 0945   potassium PHOSPHATE 20 mmol in dextrose 5 % 500 mL infusion  20 mmol Intravenous Once Angelique Blonder, RPH 84 mL/hr at 08/30/22 1002 20 mmol at 08/30/22 1002   traMADol (ULTRAM) tablet 50 mg  50 mg Oral Q6H PRN Enedina Finner, MD   50 mg at 08/28/22 2126     Discharge Medications: Please see discharge summary for a list of discharge medications.  Relevant Imaging  Results:  Relevant Lab Results:   Additional Information RU#[id number]  Luvenia Redden, RN

## 2022-08-30 NOTE — Progress Notes (Signed)
SUBJECTIVE: Tina Curtis is a 89yoF with a PMH of CKD 3, DM2, HFpEF (EF >55%, G1 DD 06/2022), renal artery stenosis s/p stenting, history of breast cancer who presented to [facility] ED 08/26/2022 from [facility] with reported vomiting and coffee-ground emesis for 2 week and weeks and right upper abdominal pain with initial concern for UGIB. Evaluated by GI, patient declined EGD.   Vitals:   08/29/22 2302 08/30/22 0312 08/30/22 0805 08/30/22 1132  BP: (!) 129/53 (!) 122/56 138/65 (!) 151/65  Pulse: 86 81 70 79  Resp: Temp: 98.4 F (36.9 C) 98.9 F (37.2 C) 98.2 F (36.8 C) 98.5 F (36.9 C)  TempSrc: Oral     SpO2: 90% 93% 95% 98%  Weight:      Height:        Intake/Output Summary (Last 24 hours) at 08/30/2022 1514 Last data filed at 08/30/2022 1132 Gross per 24 hour  Intake 240 ml  Output 600 ml  Net -360 ml    LABS: Basic Metabolic Panel: Recent Labs    08/29/22 1006 08/30/22 0437  NA 134* 134*  K 3.6 3.0*  CL 105 105  CO2 19* 19*  GLUCOSE 99 102*  BUN 30* 28*  CREATININE 1.81* 1.76*  CALCIUM 6.4* 6.8*  MG 1.8 2.0  PHOS 2.6 1.7*   Liver Function Tests: Recent Labs    08/28/22 0214 08/29/22 1006  ALBUMIN 2.7* 2.8*   No results for input(s): "LIPASE", "AMYLASE" in the last 72 hours. CBC: Recent Labs    08/28/22 0214 08/29/22 0523  WBC 14.1* 13.1*  HGB 10.0* 10.1*  HCT 29.0* 29.7*  MCV 90.1 90.3  PLT 165 194   Cardiac Enzymes: No results for input(s): "CKTOTAL", "CKMB", "CKMBINDEX", "TROPONINI" in the last 72 hours. BNP: Invalid input(s): "POCBNP" D-Dimer: No results for input(s): "DDIMER" in the last 72 hours. Hemoglobin A1C: No results for input(s): "HGBA1C" in the last 72 hours. Fasting Lipid Panel: No results for input(s): "CHOL", "HDL", "LDLCALC", "TRIG", "CHOLHDL", "LDLDIRECT" in the last 72 hours. Thyroid Function Tests: No results for input(s): "TSH", "T4TOTAL", "T3FREE", "THYROIDAB" in the last 72 hours.  Invalid input(s):  "FREET3" Anemia Panel: No results for input(s): "VITAMINB12", "FOLATE", "FERRITIN", "TIBC", "IRON", "RETICCTPCT" in the last 72 hours.   PHYSICAL EXAM General: Well developed, well nourished, in no acute distress HEENT:  Normocephalic and atramatic Neck:  No JVD.  Lungs: Clear bilaterally to auscultation and percussion. Heart: HRRR . Normal S1 and S2 without gallops or murmurs.  Abdomen: Bowel sounds are positive, abdomen soft and non-tender  Msk:  Back normal, normal gait. Normal strength and tone for age. Extremities: No clubbing, cyanosis or edema.   Neuro: Alert and oriented X 3. Psych:  Good affect, responds appropriately  TELEMETRY: sinus rhythm, HR 81 bpm  ASSESSMENT AND PLAN: Patient doing well. Sitting up in bed. Denies chest pain, shortness of breath. Elevated troponin most likely due to non-STEMI and coronary artery disease. Will continue to follow.     ICD-10-CM   1. Hematemesis with nausea  K92.0     2. New onset atrial fibrillation  I48.91     3. Hypertension, unspecified type  I10     4. Hypoxia  R09.02     5. AKI (acute kidney injury)  N17.9     6. Hypokalemia  E87.6     7. Bronchitis  J40       Principal Problem:   Hematemesis Active Problems:   Gout  Anemia of chronic renal failure, stage 3b   Depression   Essential hypertension   Pneumonia due to infectious organism   NSTEMI (non-ST elevated myocardial infarction)   Hypertensive urgency   Hypokalemia   Hypocalcemia   Chronic diastolic CHF (congestive heart failure)   Acute blood loss anemia   Hypomagnesemia   Hypophosphatemia   Type II diabetes mellitus with renal manifestations   Rash   Chest pain   Sepsis due to Streptococcus species with acute hypoxic respiratory failure without septic shock   CHF NYHA class III, acute, diastolic    Mahreen Schewe, FNP-C 08/30/2022 3:14 PM

## 2022-08-31 ENCOUNTER — Inpatient Hospital Stay: Payer: Medicare PPO

## 2022-08-31 DIAGNOSIS — I214 Non-ST elevation (NSTEMI) myocardial infarction: Secondary | ICD-10-CM | POA: Diagnosis not present

## 2022-08-31 DIAGNOSIS — J189 Pneumonia, unspecified organism: Secondary | ICD-10-CM | POA: Diagnosis not present

## 2022-08-31 DIAGNOSIS — R7881 Bacteremia: Secondary | ICD-10-CM | POA: Diagnosis not present

## 2022-08-31 DIAGNOSIS — B955 Unspecified streptococcus as the cause of diseases classified elsewhere: Secondary | ICD-10-CM | POA: Diagnosis not present

## 2022-08-31 DIAGNOSIS — A409 Streptococcal sepsis, unspecified: Secondary | ICD-10-CM | POA: Diagnosis not present

## 2022-08-31 LAB — CULTURE, BLOOD (ROUTINE X 2): Culture: NO GROWTH

## 2022-08-31 LAB — GLUCOSE, CAPILLARY
Glucose-Capillary: 104 mg/dL — ABNORMAL HIGH (ref 70–99)
Glucose-Capillary: 163 mg/dL — ABNORMAL HIGH (ref 70–99)
Glucose-Capillary: 118 mg/dL — ABNORMAL HIGH (ref 70–99)
Glucose-Capillary: 126 mg/dL — ABNORMAL HIGH (ref 70–99)

## 2022-08-31 LAB — MAGNESIUM: Magnesium: 1.9 mg/dL (ref 1.7–2.4)

## 2022-08-31 LAB — BASIC METABOLIC PANEL
Anion gap: 8 (ref 5–15)
BUN: 21 mg/dL (ref 8–23)
CO2: 20 mmol/L — ABNORMAL LOW (ref 22–32)
Calcium: 7.2 mg/dL — ABNORMAL LOW (ref 8.9–10.3)
Chloride: 106 mmol/L (ref 98–111)
Creatinine, Ser: 1.55 mg/dL — ABNORMAL HIGH (ref 0.44–1.00)
GFR, Estimated: 31 mL/min — ABNORMAL LOW (ref >60)
Glucose, Bld: 111 mg/dL — ABNORMAL HIGH (ref 70–99)
Potassium: 3.6 mmol/L (ref 3.5–5.1)
Sodium: 134 mmol/L — ABNORMAL LOW (ref 135–145)

## 2022-08-31 LAB — PHOSPHORUS: Phosphorus: 1.5 mg/dL — ABNORMAL LOW (ref 2.5–4.6)

## 2022-08-31 MED ORDER — FUROSEMIDE 40 MG PO TABS
40.0000 mg | ORAL_TABLET | Freq: Every day | ORAL | Status: DC
  Administered 2022-08-31 – 2022-09-02 (×3): 40 mg via ORAL
  Filled 2022-08-31 (×3): qty 1

## 2022-08-31 MED ORDER — SODIUM CHLORIDE 0.9% FLUSH
10.0000 mL | Freq: Two times a day (BID) | INTRAVENOUS | Status: DC
  Administered 2022-08-31 – 2022-09-02 (×4): 10 mL

## 2022-08-31 MED ORDER — GADOBUTROL 1 MMOL/ML IV SOLN
6.0000 mL | Freq: Once | INTRAVENOUS | Status: AC | PRN
  Administered 2022-08-31: 6 mL via INTRAVENOUS

## 2022-08-31 MED ORDER — RISAQUAD PO CAPS
1.0000 | ORAL_CAPSULE | Freq: Every day | ORAL | Status: DC
  Administered 2022-08-31 – 2022-09-02 (×3): 1 via ORAL
  Filled 2022-08-31 (×3): qty 1

## 2022-08-31 MED ORDER — POTASSIUM & SODIUM PHOSPHATES 280-160-250 MG PO PACK
1.0000 | PACK | Freq: Three times a day (TID) | ORAL | Status: AC
  Administered 2022-08-31 (×4): 1 via ORAL
  Filled 2022-08-31 (×4): qty 1

## 2022-08-31 MED ORDER — SODIUM CHLORIDE 0.9% FLUSH: 10.0000 mL | INTRAVENOUS | Status: DC | PRN

## 2022-08-31 MED ORDER — EMPAGLIFLOZIN 10 MG PO TABS
10.0000 mg | ORAL_TABLET | Freq: Every day | ORAL | Status: DC
  Administered 2022-08-31 – 2022-09-02 (×3): 10 mg via ORAL
  Filled 2022-08-31 (×3): qty 1

## 2022-08-31 NOTE — Progress Notes (Addendum)
Date of Admission:  08/26/2022     ID: Tina Curtis is a 89 y.o. female Principal Problem:   Hematemesis Active Problems:   Gout   Anemia of chronic renal failure, stage 3b   Depression   Essential hypertension   Pneumonia due to infectious organism   NSTEMI (non-ST elevated myocardial infarction)   Hypertensive urgency   Hypokalemia   Hypocalcemia   Chronic diastolic CHF (congestive heart failure)   Acute blood loss anemia   Hypomagnesemia   Hypophosphatemia   Type II diabetes mellitus with renal manifestations   Rash   Chest pain   Sepsis due to Streptococcus species with acute hypoxic respiratory failure without septic shock   CHF NYHA class III, acute, diastolic    Subjective: Daughter at bed side- pt is feeling better Back pain is better but using heating pad Diarrhea almost resolved No nausea or vomiting  Medications:   allopurinol  100 mg Oral Daily   anastrozole  1 mg Oral Daily   aspirin  81 mg Oral Daily   atorvastatin  40 mg Oral Daily   azithromycin  250 mg Oral Daily   calcium carbonate  1,000 mg of elemental calcium Oral BID WC   cholecalciferol  2,000 Units Oral Daily   citalopram  10 mg Oral Daily   clopidogrel  75 mg Oral Daily   cyanocobalamin  1,000 mcg Oral Daily   DULoxetine  60 mg Oral Daily   enoxaparin (LOVENOX) injection  30 mg Subcutaneous Q24H   gabapentin  300 mg Oral QHS   hydrALAZINE  25 mg Oral TID   hydrocortisone cream   Topical BID   insulin aspart  0-5 Units Subcutaneous QHS   insulin aspart  0-9 Units Subcutaneous TID WC   isosorbide mononitrate  30 mg Oral Daily   metoprolol succinate  37.5 mg Oral QHS   multivitamin  1 tablet Oral Daily   pantoprazole  40 mg Oral Daily   potassium & sodium phosphates  1 packet Oral TID WC & HS   traZODone  25 mg Oral QHS    Objective: Vital signs in last 24 hours: Patient Vitals for the past 24 hrs:  BP Temp Temp src Pulse Resp SpO2  08/31/22 0750 (!) 154/64 98.8 F (37.1 C) Oral  77 14 95 %  08/31/22 0529 (!) 153/65 99.6 F (37.6 C) Oral 87 18 91 %  08/30/22 2315 137/60 -- -- 84 18 93 %  08/30/22 2111 (!) 141/66 99.5 F (37.5 C) -- 86 16 97 %  08/30/22 1702 139/61 -- -- 79 16 97 %      PHYSICAL EXAM:  General: Alert, cooperative, frail Head: Normocephalic, without obvious abnormality, atraumatic. Eyes: Conjunctivae clear, anicteric sclerae. Pupils are equal ENT Nares normal. No drainage or sinus tenderness. Lips, mucosa, and tongue normal. No Thrush Neck: Supple, symmetrical, no adenopathy, thyroid: non tender no carotid bruit and no JVD. Back: No CVA tenderness. Lungs: Clear to auscultation bilaterally. No Wheezing or Rhonchi. No rales. Heart: Regular rate and rhythm, no murmur, rub or gallop. Abdomen: Soft, non-tender,not distended. Bowel sounds normal. No masses Extremities: atraumatic, no cyanosis. No edema. No clubbing Skin: pinkish discoloration of skin over back No obvious rash Lymph: Cervical, supraclavicular normal. Neurologic: Grossly non-focal  Lab Results    Latest Ref Rng & Units 08/29/2022    5:23 AM 08/28/2022    2:14 AM 08/27/2022    7:42 AM  CBC  WBC 4.0 - 10.5 K/uL 13.1  14.1  13.3   Hemoglobin 12.0 - 15.0 g/dL 16.1  09.6  04.5   Hematocrit 36.0 - 46.0 % 29.7  29.0  31.5   Platelets 150 - 400 K/uL 194  165  154        Latest Ref Rng & Units 08/31/2022    5:26 AM 08/30/2022    5:51 PM 08/30/2022    4:37 AM  CMP  Glucose 70 - 99 mg/dL 409   811   BUN 8 - 23 mg/dL 21   28   Creatinine 9.14 - 1.00 mg/dL 7.82  9.56  2.13   Sodium 135 - 145 mmol/L 134   134   Potassium 3.5 - 5.1 mmol/L 3.6  3.4  3.0   Chloride 98 - 111 mmol/L 106   105   CO2 22 - 32 mmol/L 20   19   Calcium 8.9 - 10.3 mg/dL 7.2   6.8       Microbiology: [facility] 4/4 Group G strep Repeat blood culture NG   Assessment/Plan:  Streptococcus bacteremia  With sepsis High bioburden 4/4 Cannot attribute it to translocation  need to r/o deep seated infection-Low back  pain - so will get MRI lumbar spine to r/o any infection  If MRI No evidence of infection,  then will do 2 weeks of IV antibiotic  and do a blood culture 2 weeks after that Pt does not want TEE  Gastroenteritis due to sapo virus- resolved  PCN allergy- has tolerated ceftriaxone and cefdinir in the past     Rash on her back- is fading- she has had this before -more like asteatotic eczema   Discussed the   H/o ca breast b/l mastectomy in 2021  Discussed the management with patient and her daughter and the hospitalist

## 2022-08-31 NOTE — Care Management Important Message (Signed)
Important Message  Patient Details  Name: Tina Curtis MRN: [id number] Date of Birth: [birth year]   Medicare Important Message Given:  Yes     Johnell Comings 08/31/2022, 11:53 AM

## 2022-08-31 NOTE — Progress Notes (Signed)
[facility] CLINIC CARDIOLOGY CONSULT NOTE       Patient ID: Tina Curtis MRN: [id number] [name]/AGE: Apr 06, 1927 89 y.o.  Admit date: 08/26/2022 Referring Physician Dr. Lorretta Harp Primary Physician Dr. Larwance Sachs  Primary Cardiologist Dr. Lady Gary (seen once in 2019) Reason for Consultation possible atrial fibrillation  HPI: Tina Curtis is a 89yoF with a PMH of CKD 3, DM2, HFpEF (EF >55%, G1 DD 06/2022), renal artery stenosis s/p stenting, history of breast cancer who presented to [facility] ED 08/26/2022 from [facility] with reported vomiting and coffee-ground emesis for 2 week and weeks and right upper abdominal pain with initial concern for UGIB. Evaluated by GI, patient declined EGD. Hgb stable overnight. EKG demonstrates normal sinus rhythm without tele evidence of atrial fibrillation or other arrhythmias so far. The patient is being treated for streptococcus bacteremia and possible PNA. Stool pathogen panel positive for sapovirus. Troponin uptrending to current peak of 3677. Continuing medical management from a cardiac perspective.   Interval History:  - feels overall better, on room air - main complaint is gas  - pleuritic chest pain improving, no shortness of breath or other complaints  Review of systems complete and found to be negative unless listed above     Past Medical History:  Diagnosis Date   Arthritis    Gout   Chronic kidney disease    Diarrhea    Hypercholesteremia    Hypertension    Neuropathy    feet and lower legs   Seasonal allergies    Skin cancer    face    Past Surgical History:  Procedure Laterality Date   ABDOMINAL HYSTERECTOMY  2000   APPENDECTOMY     BREAST BIOPSY Right 09/19/2019   affirm bx of calcs UOQ, x marker, path pending   BREAST BIOPSY Right 09/19/2019   [place] bx of mass,heart marker, path pending   BREAST BIOPSY Right 09/19/2019   [place] bx of LN, coil marker, path pending   CATARACT EXTRACTION Left    CATARACT EXTRACTION W/PHACO Right 10/24/2014    Procedure: CATARACT EXTRACTION PHACO AND INTRAOCULAR LENS PLACEMENT (IOC);  Surgeon: Lockie Mola, MD;  Location: [facility];  Service: Ophthalmology;  Laterality: Right;   ESOPHAGOGASTRODUODENOSCOPY N/A 03/07/2018   Procedure: ESOPHAGOGASTRODUODENOSCOPY (EGD);  Surgeon: Toney Reil, MD;  Location: [facility] ENDOSCOPY;  Service: Gastroenterology;  Laterality: N/A;   MASTECTOMY MODIFIED RADICAL Right 10/13/2019   Procedure: MASTECTOMY MODIFIED RADICAL;  Surgeon: Earline Mayotte, MD;  Location: [facility] ORS;  Service: General;  Laterality: Right;   RENAL ANGIOGRAPHY Right 04/11/2018   Procedure: RENAL ANGIOGRAPHY;  Surgeon: Annice Needy, MD;  Location: [facility] INVASIVE CV LAB;  Service: Cardiovascular;  Laterality: Right;   SIMPLE MASTECTOMY WITH AXILLARY SENTINEL NODE BIOPSY Left 10/13/2019   Procedure: SIMPLE MASTECTOMY TRUE CUT BIOPSY, SENTINEL NODE BIOPSY;  Surgeon: Earline Mayotte, MD;  Location: [facility] ORS;  Service: General;  Laterality: Left;    Medications Prior to Admission  Medication Sig Dispense Refill Last Dose   albuterol (VENTOLIN HFA) 108 (90 Base) MCG/ACT inhaler Inhale 2 puffs into the lungs every 6 (six) hours as needed for wheezing or shortness of breath. 8 g 0 unknown   allopurinol (ZYLOPRIM) 100 MG tablet Take 100 mg by mouth daily.   08/25/2022   anastrozole (ARIMIDEX) 1 MG tablet TAKE 1 TABLET(1 MG) BY MOUTH DAILY. DO NOT. START IF RASH IS NOT BETTER (Patient taking differently: Take 1 mg by mouth daily.) 90 tablet 1 08/25/2022   aspirin 81  MG EC tablet Take 81 mg by mouth daily.   08/25/2022   citalopram (CELEXA) 10 MG tablet Take 10 mg by mouth daily.    08/25/2022   clopidogrel (PLAVIX) 75 MG tablet TAKE 1 TABLET BY MOUTH EVERY DAY (Patient taking differently: Take 75 mg by mouth daily.) 30 tablet 1 08/25/2022   cyanocobalamin (VITAMIN B12) 1000 MCG tablet Take 1,000 mcg by mouth daily.   08/25/2022   docusate sodium (COLACE) 100 MG capsule Take 100 mg by mouth daily  as needed for mild constipation.   08/25/2022   DULoxetine (CYMBALTA) 60 MG capsule Take 60 mg by mouth daily.    08/25/2022   empagliflozin (JARDIANCE) 10 MG TABS tablet Take 10 mg by mouth daily.   08/25/2022   Ergocalciferol 50 MCG (2000 UT) TABS Take 2,000 Units by mouth daily.   08/25/2022   furosemide (LASIX) 40 MG tablet Take 1 tablet (40 mg total) by mouth daily. 30 tablet 0 08/25/2022   gabapentin (NEURONTIN) 300 MG capsule Take 300 mg by mouth at bedtime.   08/25/2022   hydrALAZINE (APRESOLINE) 25 MG tablet Take 25 mg by mouth 3 (three) times daily.   08/25/2022   loratadine (CLARITIN) 10 MG tablet Take 10 mg by mouth daily as needed for allergies. AM   unknown   metoprolol succinate (TOPROL-XL) 25 MG 24 hr tablet Take 37.5 mg by mouth at bedtime.    08/25/2022   Multiple Vitamins-Minerals (ICAPS AREDS 2 PO) Take 2 capsules by mouth daily.   Past Week   [EXPIRED] ondansetron (ZOFRAN-ODT) 4 MG disintegrating tablet Take 1 tablet (4 mg total) by mouth every 8 (eight) hours as needed for up to 5 days. 15 tablet 0 unknown   simvastatin (ZOCOR) 20 MG tablet Take 20 mg by mouth every evening. PM   08/25/2022   triamcinolone cream (KENALOG) 0.1 % Apply 1 Application topically 2 (two) times daily.   unknown    Social History   Socioeconomic History   Marital status: Widowed    Spouse name: Not on file   Number of children: Not on file   Years of education: Not on file   Highest education level: Not on file  Occupational History   Not on file  Tobacco Use   Smoking status: Never   Smokeless tobacco: Never  Substance and Sexual Activity   Alcohol use: No   Drug use: Never   Sexual activity: Not on file  Other Topics Concern   Not on file  Social History Narrative   2 daughters; lives in [address]; Runner, broadcasting/film/video retd. 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    Social Determinants of Health   Financial Resource Strain: Not on file  Food Insecurity: No Food Insecurity (08/27/2022)    Hunger Vital Sign    Worried About Running Out of Food in the Last Year: Never true    Ran Out of Food in the Last Year: Never true  Transportation Needs: No Transportation Needs (08/27/2022)   PRAPARE - Administrator, Civil Service (Medical): No    Lack of Transportation (Non-Medical): No  Physical Activity: Not on file  Stress: Not on file  Social Connections: Not on file  Intimate Partner Violence: Not At Risk (08/27/2022)   Humiliation, Afraid, Rape, and Kick questionnaire    Fear of Current or Ex-Partner: No    Emotionally Abused: No    Physically Abused: No    Sexually Abused: No    Family History  Problem  Relation Age of Onset   Hypertension Mother    Hypertension Father    Stroke Father    Breast cancer Daughter 15      Intake/Output Summary (Last 24 hours) at 08/31/2022 1031 Last data filed at 08/31/2022 0530 Gross per 24 hour  Intake 580 ml  Output 750 ml  Net -170 ml     Vitals:   08/30/22 2111 08/30/22 2315 08/31/22 0529 08/31/22 0750  BP: (!) 141/66 137/60 (!) 153/65 (!) 154/64  Pulse: 86 84 87 77  Resp: Temp: 99.5 F (37.5 C)  99.6 F (37.6 C) 98.8 F (37.1 C)  TempSrc:   Oral Oral  SpO2: 97% 93% 91% 95%  Weight:      Height:        PHYSICAL EXAM General: elderly ill appearing caucasian female, sitting up in recliner HEENT:  Normocephalic and atraumatic. Neck:  No JVD.  Chest: erythema fading from chest Back: fading erythema extending across entirety of back and across right flank  Lungs: Normal respiratory effort on room air, Decreased breath sounds with crackles right base Heart: HRRR . Normal S1 and S2, 3/6 systolic murmur best heard at LLSB.  Abdomen: Non-distended appearing.  Msk: Normal strength and tone for age. Extremities: Warm and well perfused. No clubbing, cyanosis. No peripheral edema.  Neuro: Alert and oriented X 3. Psych:  Answers questions appropriately.   Labs: Basic Metabolic Panel: Recent Labs     08/30/22 0437 08/30/22 1751 08/31/22 0526  NA 134*  --  134*  K 3.0* 3.4* 3.6  CL 105  --  106  CO2 19*  --  20*  GLUCOSE 102*  --  111*  BUN 28*  --  21  CREATININE 1.76* 1.78* 1.55*  CALCIUM 6.8*  --  7.2*  MG 2.0  --  1.9  PHOS 1.7* 2.2* 1.5*    Liver Function Tests: Recent Labs    08/29/22 1006  ALBUMIN 2.8*    No results for input(s): "LIPASE", "AMYLASE" in the last 72 hours.  CBC: Recent Labs    08/29/22 0523  WBC 13.1*  HGB 10.1*  HCT 29.7*  MCV 90.3  PLT 194    Cardiac Enzymes: No results for input(s): "CKTOTAL", "CKMB", "CKMBINDEX", "TROPONINIHS" in the last 72 hours.  BNP: No results for input(s): "BNP" in the last 72 hours.  D-Dimer: No results for input(s): "DDIMER" in the last 72 hours.  Hemoglobin A1C: No results for input(s): "HGBA1C" in the last 72 hours.  Fasting Lipid Panel: No results for input(s): "CHOL", "HDL", "LDLCALC", "TRIG", "CHOLHDL", "LDLDIRECT" in the last 72 hours.  Thyroid Function Tests: No results for input(s): "TSH", "T4TOTAL", "T3FREE", "THYROIDAB" in the last 72 hours.  Invalid input(s): "FREET3" Anemia Panel: No results for input(s): "VITAMINB12", "FOLATE", "FERRITIN", "TIBC", "IRON", "RETICCTPCT" in the last 72 hours.   Radiology: ECHOCARDIOGRAM COMPLETE  Result Date: 08/27/2022    ECHOCARDIOGRAM REPORT   Patient Name:   KACELYN ROWZEE Date of Exam: 08/27/2022 Medical Rec #:  [id number]     Height:       64.0 in Accession #:    [id number]    Weight:       132.3 lb Date of Birth:  [birth year]    BSA:          1.641 m Patient Age:    89 years      BP:           136/66 mmHg Patient Gender: F  HR:           67 bpm. Exam Location:  [facility] Procedure: 2D Echo, Cardiac Doppler and Color Doppler Indications:     Elevated Troponin  History:         Patient has no prior history of Echocardiogram examinations.                  CHF, Acute MI, Signs/Symptoms:Chest Pain,                  Dizziness/Lightheadedness and  Fatigue; Risk                  Factors:Hypertension and Diabetes. Breast CA.  Sonographer:     Mikki Harbor Referring Phys:  [id number] [facility] MICHELLE Keshawna Dix Diagnosing Phys: Marcina Millard MD IMPRESSIONS  1. Left ventricular ejection fraction, by estimation, is 60 to 65%. The left ventricle has normal function. The left ventricle has no regional wall motion abnormalities. Left ventricular diastolic parameters are consistent with Grade I diastolic dysfunction (impaired relaxation).  2. Right ventricular systolic function is normal. The right ventricular size is normal. There is moderately elevated pulmonary artery systolic pressure.  3. Left atrial size was mild to moderately dilated.  4. Right atrial size was mild to moderately dilated.  5. The mitral valve is normal in structure. Moderate to severe mitral valve regurgitation. No evidence of mitral stenosis.  6. Tricuspid valve regurgitation is moderate to severe.  7. The aortic valve is normal in structure. Aortic valve regurgitation is mild. No aortic stenosis is present.  8. The inferior vena cava is normal in size with greater than 50% respiratory variability, suggesting right atrial pressure of 3 mmHg. FINDINGS  Left Ventricle: Left ventricular ejection fraction, by estimation, is 60 to 65%. The left ventricle has normal function. The left ventricle has no regional wall motion abnormalities. The left ventricular internal cavity size was normal in size. There is  no left ventricular hypertrophy. Left ventricular diastolic parameters are consistent with Grade I diastolic dysfunction (impaired relaxation). Right Ventricle: The right ventricular size is normal. No increase in right ventricular wall thickness. Right ventricular systolic function is normal. There is moderately elevated pulmonary artery systolic pressure. The tricuspid regurgitant velocity is 3.60 m/s, and with an assumed right atrial pressure of 8 mmHg, the estimated right ventricular systolic  pressure is 59.8 mmHg. Left Atrium: Left atrial size was mild to moderately dilated. Right Atrium: Right atrial size was mild to moderately dilated. Pericardium: There is no evidence of pericardial effusion. Mitral Valve: The mitral valve is normal in structure. Moderate to severe mitral valve regurgitation. No evidence of mitral valve stenosis. MV peak gradient, 4.8 mmHg. The mean mitral valve gradient is 1.0 mmHg. Tricuspid Valve: The tricuspid valve is normal in structure. Tricuspid valve regurgitation is moderate to severe. No evidence of tricuspid stenosis. Aortic Valve: The aortic valve is normal in structure. Aortic valve regurgitation is mild. No aortic stenosis is present. Aortic valve mean gradient measures 4.0 mmHg. Aortic valve peak gradient measures 10.1 mmHg. Aortic valve area, by VTI measures 2.75  cm. Pulmonic Valve: The pulmonic valve was normal in structure. Pulmonic valve regurgitation is not visualized. No evidence of pulmonic stenosis. Aorta: The aortic root is normal in size and structure. Venous: The inferior vena cava is normal in size with greater than 50% respiratory variability, suggesting right atrial pressure of 3 mmHg. IAS/Shunts: No atrial level shunt detected by color flow Doppler.  LEFT VENTRICLE PLAX 2D LVIDd:  4.50 cm   Diastology LVIDs:         2.80 cm   LV e' medial:    6.42 cm/s LV PW:         1.00 cm   LV E/e' medial:  17.0 LV IVS:        1.30 cm   LV e' lateral:   8.70 cm/s LVOT diam:     2.10 cm   LV E/e' lateral: 12.5 LV SV:         86 LV SV Index:   52 LVOT Area:     3.46 cm  RIGHT VENTRICLE RV Basal diam:  3.65 cm RV Mid diam:    2.70 cm RV S prime:     8.92 cm/s TAPSE (M-mode): 2.0 cm LEFT ATRIUM              Index        RIGHT ATRIUM           Index LA diam:        4.10 cm  2.50 cm/m   RA Area:     19.20 cm LA Vol (A2C):   115.0 ml 70.07 ml/m  RA Volume:   50.40 ml  30.71 ml/m LA Vol (A4C):   61.7 ml  37.60 ml/m LA Biplane Vol: 85.5 ml  52.10 ml/m  AORTIC  VALVE                    PULMONIC VALVE AV Area (Vmax):    2.03 cm     PV Vmax:       0.90 m/s AV Area (Vmean):   2.40 cm     PV Peak grad:  3.2 mmHg AV Area (VTI):     2.75 cm AV Vmax:           159.00 cm/s AV Vmean:          91.500 cm/s AV VTI:            0.311 m AV Peak Grad:      10.1 mmHg AV Mean Grad:      4.0 mmHg LVOT Vmax:         93.40 cm/s LVOT Vmean:        63.300 cm/s LVOT VTI:          0.247 m LVOT/AV VTI ratio: 0.79  AORTA Ao Root diam: 3.30 cm Ao Asc diam:  4.10 cm MITRAL VALVE                TRICUSPID VALVE MV Area (PHT): 4.65 cm     TR Peak grad:   51.8 mmHg MV Area VTI:   2.77 cm     TR Vmax:        360.00 cm/s MV Peak grad:  4.8 mmHg MV Mean grad:  1.0 mmHg     SHUNTS MV Vmax:       1.10 m/s     Systemic VTI:  0.25 m MV Vmean:      54.4 cm/s    Systemic Diam: 2.10 cm MV Decel Time: 163 msec MV E velocity: 109.00 cm/s MV A velocity: 77.00 cm/s MV E/A ratio:  1.42 Marcina Millard MD Electronically signed by Marcina Millard MD Signature Date/Time: 08/27/2022/1:16:52 PM    Final    US Venous Img Lower Bilateral (DVT)  Result Date: 08/26/2022 CLINICAL DATA:  220372 Positive D dimer [id number]. EXAM: BILATERAL LOWER EXTREMITY VENOUS DOPPLER ULTRASOUND TECHNIQUE: Gray-scale sonography with compression, as well  as color and duplex ultrasound, were performed to evaluate the deep venous system(s) from the level of the common femoral vein through the popliteal and proximal calf veins. COMPARISON:  None Available. FINDINGS: VENOUS Normal compressibility of the common femoral, superficial femoral, and popliteal veins, as well as the visualized calf veins. Visualized portions of profunda femoral vein and great saphenous vein unremarkable. No filling defects to suggest DVT on grayscale or color Doppler imaging. Doppler waveforms show normal direction of venous flow, normal respiratory plasticity and response to augmentation. OTHER Cyst in the left popliteal fossa measuring up to 5 cm, likely Baker's  cyst. Limitations: none IMPRESSION: 1. No lower extremity DVT. 2. 5 cm left Baker's cyst. Electronically Signed   By: Orvan Falconer M.D.   On: 08/26/2022 15:45   NM Pulmonary Perfusion  Result Date: 08/26/2022 CLINICAL DATA:  Pulmonary embolus suspected EXAM: NUCLEAR MEDICINE PERFUSION LUNG SCAN TECHNIQUE: Perfusion images were obtained in multiple projections after intravenous injection of radiopharmaceutical. Ventilation scans intentionally deferred if perfusion scan and chest x-ray adequate for interpretation during COVID 19 epidemic. RADIOPHARMACEUTICALS:  4.2 mCi Tc-38m MAA IV COMPARISON:  None Available. FINDINGS: Bilateral mild-to-moderate perfusion defects. No large perfusion defects. IMPRESSION: Nondiagnostic (intermediate probability). Correlate with lower extremity ultrasound and consider chest CTA for further evaluation. Electronically Signed   By: Allegra Lai M.D.   On: 08/26/2022 15:10   DG Chest Port 1 View  Result Date: 08/26/2022 CLINICAL DATA:  89 year old female with history of hematemesis and generalized weakness. EXAM: PORTABLE CHEST 1 VIEW COMPARISON:  Chest x-ray 06/07/2022. FINDINGS: Lung volumes are low. Bibasilar opacities favored to reflect areas of subsegmental atelectasis. Diffuse interstitial prominence and widespread peribronchial cuffing. No definite pleural effusions. No pneumothorax. No evidence of pulmonary edema. Heart size is normal. Upper mediastinal contours are within normal limits allowing for patient positioning. Atherosclerotic calcifications in the thoracic aorta. IMPRESSION: 1. The appearance of the chest is concerning for probable acute bronchitis, as above. Some degree of underlying interstitial lung disease is also suspected. 2. Aortic atherosclerosis. Electronically Signed   By: Trudie Reed M.D.   On: 08/26/2022 05:51   CT Angio Chest/Abd/Pel for Dissection W and/or Wo Contrast  Result Date: 08/22/2022 CLINICAL DATA:  Vomiting and diarrhea.  Concern for acute aortic syndrome. History of breast cancer. EXAM: CT ANGIOGRAPHY CHEST, ABDOMEN AND PELVIS TECHNIQUE: Non-contrast CT of the chest was initially obtained. Multidetector CT imaging through the chest, abdomen and pelvis was performed using the standard protocol during bolus administration of intravenous contrast. Multiplanar reconstructed images and MIPs were obtained and reviewed to evaluate the vascular anatomy. RADIATION DOSE REDUCTION: This exam was performed according to the departmental dose-optimization program which includes automated exposure control, adjustment of the mA and/or kV according to patient size and/or use of iterative reconstruction technique. CONTRAST:  80mL OMNIPAQUE IOHEXOL 350 MG/ML SOLN COMPARISON:  CT chest dated June 08, 2022. CT abdomen pelvis dated March 18, 2022. FINDINGS: CTA CHEST FINDINGS Cardiovascular: Preferential opacification of the thoracic aorta. No evidence of thoracic aortic aneurysm or dissection. No intramural hematoma. Coronary, aortic arch, and branch vessel atherosclerotic vascular disease. Normal heart size. No pericardial effusion. No pulmonary embolism. Mediastinum/Nodes: No enlarged mediastinal, hilar, or axillary lymph nodes. Thyroid gland, trachea, and esophagus demonstrate no significant findings. Lungs/Pleura: Bibasilar scarring. No focal consolidation, pleural effusion, or pneumothorax. Musculoskeletal: Status post bilateral mastectomies. No acute or significant osseous findings. Review of the MIP images confirms the above findings. CTA ABDOMEN AND PELVIS FINDINGS VASCULAR Aorta: Unchanged infrarenal abdominal  aortic aneurysm measuring up to 3.6 cm in diameter. No dissection, vasculitis or significant stenosis. Calcified and noncalcified atherosclerotic plaque. 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 Appearance is suggestive of median arcuate ligament compression.  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 Renals: Single renal arteries bilaterally. Chronic severe stenosis of the distal left renal artery with severe left renal atrophy. Patent right proximal renal artery stent. IMA: Patent without evidence of aneurysm, dissection, vasculitis or significant stenosis. Inflow: Patent without evidence of aneurysm, dissection, vasculitis or significant stenosis. Veins: No obvious venous abnormality within the limitations of this arterial phase study. Review of the MIP images confirms the above findings. NON-VASCULAR Hepatobiliary: No focal liver abnormality is seen. No gallstones, gallbladder wall thickening, or biliary dilatation. Pancreas: No ductal dilatation or surrounding inflammatory changes. Multiple cystic pancreatic lesions measuring up to 2.6 cm, previously 2.5 cm. Spleen: Normal in size without focal abnormality. Adrenals/Urinary Tract: Adrenal glands are unremarkable. Chronic severe left renal atrophy. No renal calculi or hydronephrosis. Bladder is unremarkable. Stomach/Bowel: Unchanged small hiatal hernia. No bowel wall thickening, distention, or surrounding inflammatory changes. History of prior appendectomy. Lymphatic: No enlarged abdominal or pelvic lymph nodes. Reproductive: Status post hysterectomy. No adnexal masses. Other: No free fluid or pneumoperitoneum. Musculoskeletal: No acute or significant osseous findings. Review of the MIP images confirms the above findings. IMPRESSION: 1. No evidence of acute aortic syndrome. 2. Unchanged infrarenal abdominal aortic aneurysm measuring up to 3.6 cm in diameter. Recommend follow-up ultrasound every 2 years. 3. 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 No evidence of bowel ischemia. 4. Multiple cystic pancreatic lesions measuring up to 2.6 cm. Given the patient's age, no routine follow-up is recommended. 5.  Aortic atherosclerosis (ICD10-I70.0). Electronically Signed   By: Obie Dredge M.D.   On:  08/22/2022 13:34   CT HEAD WO CONTRAST ( )  Result Date: 08/22/2022 CLINICAL DATA:  Acute onset of severe headache. EXAM: CT HEAD WITHOUT CONTRAST TECHNIQUE: Contiguous axial images were obtained from the base of the skull through the vertex without intravenous contrast. RADIATION DOSE REDUCTION: This exam was performed according to the departmental dose-optimization program which includes automated exposure control, adjustment of the mA and/or kV according to patient size and/or use of iterative reconstruction technique. COMPARISON:  06/05/2022 FINDINGS: Brain: No evidence of intracranial hemorrhage, acute infarction, hydrocephalus, extra-axial collection, or mass lesion/mass effect. Mild cerebral atrophy and chronic small vessel disease again noted. Vascular:  No hyperdense vessel or other acute findings. Skull: No evidence of fracture or other significant bone abnormality. Sinuses/Orbits:  No acute findings. Other: None. IMPRESSION: No acute intracranial abnormality. Stable mild cerebral atrophy and chronic small vessel disease. Electronically Signed   By: Danae Orleans M.D.   On: 08/22/2022 13:22   DG Bone Density  Result Date: 08/20/2022 EXAM: DUAL X-RAY ABSORPTIOMETRY (DXA) FOR BONE MINERAL DENSITY IMPRESSION: Your patient Emiliana Blaize completed a BMD test on 08/20/2022 using the [employer] DXA System (software version: 14.10) manufactured by [employer]. The following summarizes the results of our evaluation. Technologist: [facility] PATIENT BIOGRAPHICAL: Name: Marilynne, Dupuis Patient ID: [id number] Birth Date: 17-Nov-1926 Height: 64.0 in. Gender: Female Exam Date: 08/20/2022 Weight: 135.1 lbs. Indications: History of Breast Cancer, Advanced Age, Hysterectomy, Caucasian, History of Radiation, Oophorectomy Bilateral, Postmenopausal Fractures: Treatments: Anastrozole, Calcium, Prolia, Vitamin D DENSITOMETRY RESULTS: Site         Region     Measured Date  Measured Age WHO Classification Young Adult  T-score BMD         %Change vs. Previous Significant Change (*) DualFemur Neck Right 08/20/2022 93.2 Osteoporosis -2.7 0.669 g/cm2 2.3% - DualFemur Neck Right 03/07/2020 90.8 Osteoporosis -2.8 0.654 g/cm2 - - DualFemur Total Mean 08/20/2022 93.2 Osteoporosis -2.6 0.685 g/cm2 1.3% - DualFemur Total Mean 03/07/2020 90.8 Osteoporosis -2.6 0.676 g/cm2 - - Left Forearm Radius 33% 08/20/2022 93.2 Osteoporosis -3.1 0.604 g/cm2 10.2% Yes Left Forearm Radius 33% 03/07/2020 90.8 Osteoporosis -3.7 0.548 g/cm2 - - ASSESSMENT: The BMD measured at Forearm Radius 33% is 0.604 g/cm2 with a T-score of -3.1. This patient is considered osteoporotic according to World Health Organization [facility]) criteria. The scan quality is good. Lumbar spine was not utilized due to advanced degenerative changes. Compared with prior study, there has been no significant change in the total hip. [employer] writer [facility]) criteria for post-menopausal, Caucasian Women: Normal:                   T-score at or above -1 SD Osteopenia/low bone mass: T-score between -1 and -2.5 SD Osteoporosis:             T-score at or below -2.5 SD RECOMMENDATIONS: 1. All patients should optimize calcium and vitamin D intake. 2. Consider FDA-approved medical therapies in postmenopausal women and men aged 31 years and older, based on the following: a. A hip or vertebral(clinical or morphometric) fracture b. T-score < -2.5 at the femoral neck or spine after appropriate evaluation to exclude secondary causes c. Low bone mass (T-score between -1.0 and -2.5 at the femoral neck or spine) and a 10-year probability of a hip fracture > 3% or a 10-year probability of a major osteoporosis-related fracture > 20% based on the [place]-adapted WHO algorithm 3. Clinician judgment and/or patient preferences may indicate treatment for people with 10-year fracture probabilities above or below these levels FOLLOW-UP: People with diagnosed cases of osteoporosis or at high risk for fracture should  have regular bone mineral density tests. For patients eligible for Medicare, routine testing is allowed once every 2 years. The testing frequency can be increased to one year for patients who have rapidly progressing disease, those who are receiving or discontinuing medical therapy to restore bone mass, or have additional risk factors. I have reviewed this report, and agree with the above findings. [facility] Electronically Signed   By: Gerome Sam III M.D.   On: 08/20/2022 17:57    ECHO 06/29/2022 DOPPLER ECHO and OTHER SPECIAL PROCEDURES                 Aortic: MODERATE AR                MILD AS                         173.0 cm/sec peak vel      12.0 mmHg peak grad                         5.6 mmHg mean grad         1.6 cm^2 by DOPPLER                 Mitral: No MR                      MILD MS  MV Inflow E Vel = 71.4 cm/sec     MV Annulus E'Vel = 6.0 cm/sec                         E/E'Ratio = 11.9              Tricuspid: MILD TR                    No TS                         248.0 cm/sec peak TR vel   27.6 mmHg peak RV pressure              Pulmonary: MILD PR                    No PS  _________________________________________________________________________________________  INTERPRETATION  NORMAL LEFT VENTRICULAR SYSTOLIC FUNCTION  NORMAL RIGHT VENTRICULAR SYSTOLIC FUNCTION  MODERATE VALVULAR REGURGITATION (See above)  MILD VALVULAR STENOSIS (See above)  _________________________________________________________________________________________  Electronically signed by      Danella Penton, MD on 06/30/2022 05: 31 PM           Performed By: Rayetta Humphrey, RCS     Ordering Physician: Ned Clines   TELEMETRY reviewed by me (LT) 08/31/2022 : NSR rate 70s -80s with occasional PACs  EKG reviewed by me: normal sinus rhythm with nonspecific T wave changes that are similar to prior study from April 13.  Data reviewed by me (LT) 08/31/2022: GI note,  ID ntoehospitalist progress notelast 24h vitals tele labs imaging I/O    Principal Problem:   Hematemesis Active Problems:   Gout   Anemia of chronic renal failure, stage 3b   Depression   Essential hypertension   Pneumonia due to infectious organism   NSTEMI (non-ST elevated myocardial infarction)   Hypertensive urgency   Hypokalemia   Hypocalcemia   Chronic diastolic CHF (congestive heart failure)   Acute blood loss anemia   Hypomagnesemia   Hypophosphatemia   Type II diabetes mellitus with renal manifestations   Rash   Chest pain   Sepsis due to Streptococcus species with acute hypoxic respiratory failure without septic shock   CHF NYHA class III, acute, diastolic    ASSESSMENT AND PLAN:  Dorita Fray is a 89yoF with a PMH of CKD 3, DM2, HFpEF (EF >55%, G1 DD 06/2022), renal artery stenosis s/p stenting, history of breast cancer who presented to [facility] ED 08/26/2022 from [facility] with reported vomiting and coffee-ground emesis for 2 week and weeks and right upper abdominal pain with initial concern for UGIB. Evaluated by GI, patient declined EGD. Hgb stable overnight. EKG demonstrates normal sinus rhythm without tele evidence of atrial fibrillation or other arrhythmias so far. The patient is being treated for streptococcus bacteremia and possible PNA. Stool pathogen panel positive for sapovirus. Troponin uptrending to current peak of 3677.   # streptococcus bacteremia  # PNA  # hematemesis  # sapovirus  Agree with current therapy per primary team, H/H stable. No further hematemesis.  ID following and managing Abx   # NSTEMI Troponins elevated and uptrending from 88, 856, to peak of 3677 this AM. EKG with nonspecific TW changes. Chest pain free this AM, just feeling generally tired.  - s/p 325mg  Aspirin. Continue Asprin 81mg  daily and restart clopidogrel 75mg  daily tomorrow. She had been on DAPT since renal artery stenting in ~2021.  Continue likely indefinitely  - s/p 48hours  heparin infusion  - continue atorvastatin 40mg  daily  - continue metoprolol XL - continue isosorbide 30mg  daily  - echo complete with preserved EF, no rWMAs - continue to treat medically with 48hrs heparin + DAPT the absence of chest pain, and in setting of infection / fever from bacteremia and GI illness, multiple co morbidities, and elderly age.   # moderate to severe TR # moderate to severe MR # HFpEF  Clinically hypervolemic with O2 requirement, crackles to auscultation. Improving with IV lasix. Consider repeat echo following discharge Re: MR severity. She is rather active and independent despite her elderly age, but does not seem interested in consideration of valve repair/replacement if offered (?mitraclip)  - s/p lasix, appears dry on exam and on room air.  - GDMT with metoprolol, isosorbide mononitrate. Otherwise limited by renal function   # electrolyte disturbances # normal sinus rhythm  Presents to [facility] with profuse nausea and vomiting and diarrhea with poor p.o. intake reportedly for the past month.  EKG read by the computer as atrial fibrillation, but this is incorrect.  Read by myself and Dr. Darrold Junker appears to be normal sinus rhythm with occasional premature atrial contractions noted on telemetry without evidence of atrial fibrillation, or other arrhythmias noted so far. -Agree with current therapy per primary team -Recommend monitoring and replenishing electrolytes for goal K >4, and mag >2 -recommending holding jardiance for now  # ? Drug rash Large confluent area of erythema migrating from chest to back and R flank - unlikely jardiance since this has been held.  -management per primary/ ID  Ok for discharge today from a cardiac perspective. Will arrange for follow up in clinic with Dr. Darrold Junker in 1-2 weeks.    This patient's plan of care was discussed and created with Dr. Juliann Pares and he is in agreement.  Signed: Rebeca Allegra , PA-C 08/31/2022, 10:31  AM [facility]

## 2022-08-31 NOTE — Progress Notes (Signed)
Occupational Therapy Treatment Patient Details Name: Tina Curtis MRN: [id number] [name]: 12-Jun-1926 Today's Date: 08/31/2022   History of present illness presented to ER secondary to hematemesis, nausea/vomiting, diarrhea and chest pain; admitted for management of streptococcus bacteremia, possible PNA and NSTEMI (peak troponin 3677, managed medically)   OT comments  Upon entering the room, pt supine in bed with family present in room. Pt is agreeable to OT intervention. When covers removed, pt reports she has had BM in bed and linens are soiled. Pt performing bed mobility with min A to EOB. Pt stands with min A with cuing for anterior weight shift. Pt stands with min guard and needing total A for peri hygiene. Pt endorses needing to use the bathroom further and ambulates with min HHA to 15' into bathroom to sit on toilet. Pt requesting to remain on commode with daughter in room and RN notified.    Recommendations for follow up therapy are one component of a multi-disciplinary discharge planning process, led by the attending physician.  Recommendations may be updated based on patient status, additional functional criteria and insurance authorization.    Assistance Recommended at Discharge Frequent or constant Supervision/Assistance  Patient can return home with the following  A lot of help with walking and/or transfers;A lot of help with bathing/dressing/bathroom;Assistance with cooking/housework;Direct supervision/assist for medications management;Direct supervision/assist for financial management;Assist for transportation;Help with stairs or ramp for entrance   Equipment Recommendations  Other (comment) (defer to next venue of care)       Precautions / Restrictions Precautions Precautions: Fall Restrictions Weight Bearing Restrictions: No       Mobility Bed Mobility Overal bed mobility: Needs Assistance Bed Mobility: Supine to Sit     Supine to sit: Min guard           Transfers Overall transfer level: Needs assistance Equipment used: 1 person hand held assist Transfers: Sit to/from Stand Sit to Stand: Mod assist, Min assist                 Balance Overall balance assessment: Needs assistance Sitting-balance support: No upper extremity supported, Feet supported Sitting balance-Leahy Scale: Good     Standing balance support: Bilateral upper extremity supported, Reliant on assistive device for balance Standing balance-Leahy Scale: Fair                             ADL either performed or assessed with clinical judgement   ADL Overall ADL's : Needs assistance/impaired                         Toilet Transfer: Minimal assistance;Regular Social worker and Hygiene: Moderate assistance              Extremity/Trunk Assessment Upper Extremity Assessment Upper Extremity Assessment: Generalized weakness   Lower Extremity Assessment Lower Extremity Assessment: Generalized weakness        Vision Patient Visual Report: No change from baseline            Cognition Arousal/Alertness: Awake/alert Behavior During Therapy: WFL for tasks assessed/performed Overall Cognitive Status: Within Functional Limits for tasks assessed                                 General Comments: Pt more confident this session and requiring less reassurance.  General Comments Red rash appears worse on back from previous session 2 days ago, MD aware    Pertinent Vitals/ Pain       Pain Assessment Pain Assessment: No/denies pain         Frequency  Min 2X/week        Progress Toward Goals  OT Goals(current goals can now be found in the care plan section)  Progress towards OT goals: Progressing toward goals     Plan Discharge plan remains appropriate;Frequency remains appropriate       AM-PAC OT "6 Clicks" Daily Activity     Outcome Measure   Help from another  person eating meals?: None Help from another person taking care of personal grooming?: A Little Help from another person toileting, which includes using toliet, bedpan, or urinal?: A Lot Help from another person bathing (including washing, rinsing, drying)?: A Lot Help from another person to put on and taking off regular upper body clothing?: A Little Help from another person to put on and taking off regular lower body clothing?: A Lot 6 Click Score: 16    End of Session    OT Visit Diagnosis: Unsteadiness on feet (R26.81);Muscle weakness (generalized) (M62.81)   Activity Tolerance Patient tolerated treatment well   Patient Left Other (comment);with family/visitor present (on toilet in bathroom)   Nurse Communication Mobility status;Other (comment) (Pt with watery stool and on toilet with daughter in room)        Time: 1610-9604 OT Time Calculation (min): 18 min  Charges: OT General Charges $OT Visit: 1 Visit OT Treatments $Self Care/Home Management : 8-22 mins  Jackquline Denmark, MS, OTR/L , CBIS ascom [phone]  08/31/22, 3:50 PM

## 2022-08-31 NOTE — Progress Notes (Signed)
[facility]  - [place] [facility]   PATIENT NAME: Tina Curtis    MR#:  [id number]  DATE OF BIRTH:  Sep 20, 1926  SUBJECTIVE:  patient sitting out in the chair. Ambulated with physical therapy hundred 30 feet. Daughter  at bedside. Patient sitting in the chair. Using k-pad. Denies much back pain. Tolerating PO diet.  VITALS:  Blood pressure (!) 145/60, pulse 85, temperature 98.5 F (36.9 C), temperature source Oral, resp. rate 18, height  (1.626 m), weight 67.9 kg, SpO2 100 %.  PHYSICAL EXAMINATION:   GENERAL:  89 y.o.-year-old patient with no acute distress. Weak, deconditioned LUNGS: decreased breath sounds bilaterally, no wheezing CARDIOVASCULAR: S1, S2 normal. No murmur  tachycardia ABDOMEN: Soft, nontender, nondistended. Bowel sounds present.  EXTREMITIES:trace  edema b/l.    NEUROLOGIC: nonfocal  patient is alert and awake SKIN erythematous rash on right arm, chest and flank-- rash fading  LABORATORY PANEL:  CBC Recent Labs  Lab 08/29/22 0523  WBC 13.1*  HGB 10.1*  HCT 29.7*  PLT 194     Chemistries  Recent Labs  Lab 08/26/22 0526 08/26/22 1835 08/31/22 0526  NA 139   < > 134*  K 2.5*   < > 3.6  CL 106   < > 106  CO2 21*   < > 20*  GLUCOSE 182*   < > 111*  BUN 21   < > 21  CREATININE 1.34*   < > 1.55*  CALCIUM 7.3*   < > 7.2*  MG 1.4*   < > 1.9  AST 56*  --   --   ALT 35  --   --   ALKPHOS 72  --   --   BILITOT 1.1  --   --    < > = values in this interval not displayed.    Assessment and Plan  Tina Curtis is a 89 y.o. female with medical history significant of hypertension, hyperlipidemia, dCHF, gout, depression, CKD-3B, neuropathy, C. difficile, breast cancer, who presents with hematemesis, nausea, vomiting, diarrhea and chest pain.  4/13, and had CTA of chest/abdomen/pelvis and was diagnosed with gastroenteritis.   Patient is normally not using oxygen, but was found to have oxygen desaturation to 87-89% on room air, which  improved to 90-92% on 2 L oxygen. Of note, pt has rash in her left chest wall and left upper arm, not itchy or painful.   CXR: 1. The appearance of the chest is concerning for probable acute bronchitis, as above. Some degree of underlying interstitial lung disease is also suspected. 2. Aortic atherosclerosis  US venous doppler 1. No lower extremity DVT. 2. 5 cm left Baker's cyst.  NM perfusion study: Indetermidate  Sepsis due to Acute bronchopneumonia Streptococcal G Bacterimia --IV rocephin + zithromax --ID consultation with Dr Juliann Pulse further IV abx recs -- patient came in with fever of 103, tachycardia, tachypnea elevated white count and hypotension, chest x-ray suggestive bronchopneumonia --4/17--BC 4/4 positive for strept species --4/19--repeat BC negative so far  NSTEMI --pt has no underlying cardiac hisotry (per records, family) --had troponin 88-- 856--3677--3299--2330--no cp --Cardiology consult with dr Cassie Freer.  --pt received  IV  heparin for 48 hours after discussing with GI Dr Maryjean Morn on ASA + plavix --Echo shows EF 60-70%  with mod to severe MR,TR, mild AR --cont statins, Imdur,toprol XL -- Cardiologist reviewed EKG, they did not think patient has A-fib.   Acute diastolic CHF Valvular regurgitation --IV lasix 20 mg bid --now d/ced --monitor  creatinine--slight bump --input/output --appears clinically euvolemic  Viral GE --GI PCR stool positive for Sapovirus --treat symptoms  Hypokalemia/hypomagnesimia/low phosphorus, hypocalcemia --Pharmacy to replete electrolytes  Vomiting/hematemesis --serial hgbs table--Seen by GI dr Norma Fredrickson --IV ppi bid--change to po  Type II diabetes mellitus with renal manifestations CKD IIIB: Recent A1c 9.0.  Poorly controlled.  Patient is taking Jardiance at home -Will hold Jardiance due to suspicious allergic reaction (rash) -SSI --pt follows with Dr Wynelle Link as out pt --monitor creat   Anemia of chronic renal  failure, stage 3b: Hgb 11.9 (12.4 on 08/22/22   Lower back pain acute on chronic -- patient rates are lower back pain as 3/10 at present. Discussed if she would like me to get lower lumbar spine MRI --pt and dter wants to hold MRI   Depression:  -Continue home medications   Rash: Etiology is not clear.  May be due to newly started Jardiance -pt reports her skin is sensitive and has had rash in the past few times. Recommend f/u dermatology --since unsure etiology will resume jardiance --improved a lot with hydrocortisone  PT/OT to see pt. Given the severity of illness and significant deconditioning patient will benefit from rehab. TOC for discharge planning. Patient and daughter agreeable.     Procedures:none Family communication :dter Cala Bradford on phone Consults :cardiology, ID, GI CODE STATUS: DNR/DNI DVT Prophylaxis :lovenox Level of care: Progressive Status is: Inpatient Remains inpatient appropriate because: sepsis, NSTEMI, CHF, GE    TOTAL  TIME TAKING CARE OF THIS PATIENT: 35 minutes.  >50% time spent on counselling and coordination of care  Note: This dictation was prepared with Dragon dictation along with smaller phrase technology. Any transcriptional errors that result from this process are unintentional.  Enedina Finner M.D    [facility]   CC: Primary care physician; [name], MD

## 2022-08-31 NOTE — Consult Note (Signed)
PHARMACY CONSULT NOTE  Pharmacy Consult for Electrolyte Monitoring and Replacement   Recent Labs: Potassium (mmol/L)  Date Value  08/31/2022 3.6   Magnesium (mg/dL)  Date Value  16/02/9603 1.9   Calcium (mg/dL)  Date Value  54/01/8118 7.2 (L)   Albumin (g/dL)  Date Value  14/78/2956 2.8 (L)   Phosphorus (mg/dL)  Date Value  21/30/8657 1.5 (L)   Sodium (mmol/L)  Date Value  08/31/2022 134 (L)   Assessment: 89 y/o F with medical history including HTN, HLD, diastolic CHF, gout, depression, CKD, neuropathy, C diff, breast cancer who presented from independent living facility with hematemesis, nausea, vomiting, diarrhea and chest pain. Patient currently admitted with Streptococcal group G bacteremia, Sapovirus gastroenteritis, and NSTEMI. Pharmacy consulted to assist with electrolyte monitoring and replacement as indicated.  Diet: Carb modified (appetite appears poor per chart review)  Goal of Therapy:  Electrolytes within normal limits  Plan:  --Na 134, stable. Will continue to monitor --K 3.6, hypokalemia resolved with replacement --Ca 7.2, continue OsCal 1000 mg elemental calcium BIDM + vitamin D3 --Phos 1.5, Phos-Nak 1 packet TIDMHS x 4 doses today --Follow-up electrolytes with AM labs tomorrow  Tressie Ellis 08/31/2022 8:07 AM

## 2022-08-31 NOTE — Discharge Instructions (Signed)

## 2022-08-31 NOTE — Progress Notes (Signed)
Physical Therapy Treatment Patient Details Name: Tina Curtis MRN: [id number] [name]: [birth year] Today's Date: 08/31/2022   History of Present Illness presented to ER secondary to hematemesis, nausea/vomiting, diarrhea and chest pain; admitted for management of streptococcus bacteremia, possible PNA and NSTEMI (peak troponin 3677, managed medically)    PT Comments    Pt received in bedside chair, daughter in room. Pt appears "brighter" today, off O2 and purewick, less c/o incontinence of loose stool. Pt able to progress gait distance to 17ft x 2 with RW and CGA. Minimal fatigue, no LOB noted. Pt does struggle occasionally with sit to stand transfers if not cued to shift weight forward upon standing. Overall, pt has made significant functional improvement over last few days and is anxiously awaiting return to [facility] for continued PT services.    Recommendations for follow up therapy are one component of a multi-disciplinary discharge planning process, led by the attending physician.  Recommendations may be updated based on patient status, additional functional criteria and insurance authorization.  Follow Up Recommendations  Can patient physically be transported by private vehicle: Yes    Assistance Recommended at Discharge Intermittent Supervision/Assistance  Patient can return home with the following A little help with walking and/or transfers;Assistance with cooking/housework;Assist for transportation;Help with stairs or ramp for entrance   Equipment Recommendations  Other (comment) (Pt has multiple RW/Rollators at home to use throughout the house)    Recommendations for Other Services       Precautions / Restrictions Precautions Precautions: Fall Restrictions Weight Bearing Restrictions: No     Mobility  Bed Mobility               General bed mobility comments: Pt in chair pre/post session    Transfers Overall transfer level: Needs assistance Equipment used:  Rolling walker (2 wheels) Transfers: Sit to/from Stand Sit to Stand: Min assist, Mod assist           General transfer comment:  (If pt properly leans forward upon standing with chair push off, she only requires MinA.)    Ambulation/Gait Ambulation/Gait assistance: Min guard Gait Distance (Feet):  (130) Assistive device: Rolling walker (2 wheels) Gait Pattern/deviations: Step-through pattern, Decreased step length - right, Decreased step length - left, Narrow base of support Gait velocity: decreased     General Gait Details:  (Excellent progression with gait tolerance this session)   Stairs             Wheelchair Mobility    Modified Rankin (Stroke Patients Only)       Balance Overall balance assessment: Needs assistance Sitting-balance support: No upper extremity supported, Feet supported Sitting balance-Leahy Scale: Good Sitting balance - Comments:  (Good upright posture in sitting, able to reach out of BOS without LOB)   Standing balance support: Bilateral upper extremity supported, Reliant on assistive device for balance Standing balance-Leahy Scale: Fair Standing balance comment:  (If pt isn't focused with initial standing, she tends to struggle with posterior retropulsion)                            Cognition Arousal/Alertness: Awake/alert Behavior During Therapy: WFL for tasks assessed/performed Overall Cognitive Status: Within Functional Limits for tasks assessed                                 General Comments: Pt more confident this session and requiring less reassurance.  Exercises      General Comments General comments (skin integrity, edema, etc.): Red rash appears worse on back from previous session 2 days ago, MD aware      Pertinent Vitals/Pain Pain Assessment Pain Assessment: No/denies pain    Home Living                          Prior Function            PT Goals (current goals can  now be found in the care plan section) Acute Rehab PT Goals Patient Stated Goal: to get stronger Progress towards PT goals: Progressing toward goals    Frequency    Min 3X/week      PT Plan Current plan remains appropriate    Co-evaluation              AM-PAC PT "6 Clicks" Mobility   Outcome Measure  Help needed turning from your back to your side while in a flat bed without using bedrails?: None Help needed moving from lying on your back to sitting on the side of a flat bed without using bedrails?: A Lot Help needed moving to and from a bed to a chair (including a wheelchair)?: A Little Help needed standing up from a chair using your arms (e.g., wheelchair or bedside chair)?: A Little Help needed to walk in hospital room?: A Little Help needed climbing 3-5 steps with a railing? : A Lot 6 Click Score: 17    End of Session Equipment Utilized During Treatment: Gait belt Activity Tolerance: Patient tolerated treatment well Patient left: in chair;with call bell/phone within reach;with chair alarm set;with family/visitor present Nurse Communication: Mobility status PT Visit Diagnosis: Muscle weakness (generalized) (M62.81);Difficulty in walking, not elsewhere classified (R26.2)     Time: 9629-5284 PT Time Calculation (min) (ACUTE ONLY): 48 min  Charges:  $Gait Training: 8-22 mins $Therapeutic Exercise: 8-22 mins $Therapeutic Activity: 8-22 mins                    Zadie Cleverly, PTA  Jannet Askew 08/31/2022, 12:38 PM

## 2022-09-01 DIAGNOSIS — I214 Non-ST elevation (NSTEMI) myocardial infarction: Secondary | ICD-10-CM | POA: Diagnosis not present

## 2022-09-01 DIAGNOSIS — R7881 Bacteremia: Secondary | ICD-10-CM | POA: Diagnosis not present

## 2022-09-01 DIAGNOSIS — A409 Streptococcal sepsis, unspecified: Secondary | ICD-10-CM | POA: Diagnosis not present

## 2022-09-01 DIAGNOSIS — J189 Pneumonia, unspecified organism: Secondary | ICD-10-CM | POA: Diagnosis not present

## 2022-09-01 DIAGNOSIS — B955 Unspecified streptococcus as the cause of diseases classified elsewhere: Secondary | ICD-10-CM | POA: Diagnosis not present

## 2022-09-01 LAB — GLUCOSE, CAPILLARY
Glucose-Capillary: 105 mg/dL — ABNORMAL HIGH (ref 70–99)
Glucose-Capillary: 166 mg/dL — ABNORMAL HIGH (ref 70–99)
Glucose-Capillary: 131 mg/dL — ABNORMAL HIGH (ref 70–99)

## 2022-09-01 LAB — BASIC METABOLIC PANEL
Anion gap: 8 (ref 5–15)
BUN: 18 mg/dL (ref 8–23)
CO2: 21 mmol/L — ABNORMAL LOW (ref 22–32)
Calcium: 7 mg/dL — ABNORMAL LOW (ref 8.9–10.3)
Chloride: 104 mmol/L (ref 98–111)
Creatinine, Ser: 1.55 mg/dL — ABNORMAL HIGH (ref 0.44–1.00)
GFR, Estimated: 31 mL/min — ABNORMAL LOW (ref >60)
Glucose, Bld: 113 mg/dL — ABNORMAL HIGH (ref 70–99)
Potassium: 3.2 mmol/L — ABNORMAL LOW (ref 3.5–5.1)
Sodium: 133 mmol/L — ABNORMAL LOW (ref 135–145)

## 2022-09-01 LAB — PHOSPHORUS: Phosphorus: 1.9 mg/dL — ABNORMAL LOW (ref 2.5–4.6)

## 2022-09-01 MED ORDER — K PHOS MONO-SOD PHOS DI & MONO 155-852-130 MG PO TABS
250.0000 mg | ORAL_TABLET | Freq: Two times a day (BID) | ORAL | Status: DC
  Administered 2022-09-02: 250 mg via ORAL
  Filled 2022-09-01: qty 1

## 2022-09-01 MED ORDER — POTASSIUM CHLORIDE 20 MEQ PO PACK
20.0000 meq | PACK | Freq: Once | ORAL | Status: AC
  Administered 2022-09-01: 20 meq via ORAL
  Filled 2022-09-01: qty 1

## 2022-09-01 MED ORDER — K PHOS MONO-SOD PHOS DI & MONO 155-852-130 MG PO TABS: 250.0000 mg | ORAL_TABLET | Freq: Two times a day (BID) | ORAL | Status: DC

## 2022-09-01 MED ORDER — POTASSIUM CHLORIDE CRYS ER 20 MEQ PO TBCR
20.0000 meq | EXTENDED_RELEASE_TABLET | Freq: Every day | ORAL | Status: DC
  Administered 2022-09-02: 20 meq via ORAL
  Filled 2022-09-01: qty 1

## 2022-09-01 MED ORDER — POTASSIUM PHOSPHATES 15 MMOLE/5ML IV SOLN
30.0000 mmol | Freq: Once | INTRAVENOUS | Status: AC
  Administered 2022-09-01: 30 mmol via INTRAVENOUS
  Filled 2022-09-01: qty 10

## 2022-09-01 NOTE — TOC Progression Note (Signed)
Transition of Care [facility]) - Progression Note    Patient Details  Name: Tina Curtis MRN: [id number] Date of Birth: 02/23/1927  Transition of Care [facility]) CM/SW Contact  Truddie Hidden, RN Phone Number: 09/01/2022, 10:46 AM  Clinical Narrative:    [facility] auth started.    Expected Discharge Plan: Skilled Nursing Facility Barriers to Discharge: Continued Medical Work up  Expected Discharge Plan and Services   Discharge Planning Services: CM Consult   Living arrangements for the past 2 months: Independent Living Facility [facility])                                       Social Determinants of Health (SDOH) Interventions SDOH Screenings   Food Insecurity: No Food Insecurity (08/27/2022)  Housing: Low Risk  (08/27/2022)  Transportation Needs: No Transportation Needs (08/27/2022)  Utilities: Not At Risk (08/27/2022)  Tobacco Use: Low Risk  (08/27/2022)    Readmission Risk Interventions    06/08/2022    3:30 PM  Readmission Risk Prevention Plan  Transportation Screening Complete  Palliative Care Screening Not Applicable  Medication Review (RN Care Manager) Complete

## 2022-09-01 NOTE — Progress Notes (Signed)
Awaiting insuracen Berkley Harvey

## 2022-09-01 NOTE — Consult Note (Signed)
PHARMACY CONSULT NOTE  Pharmacy Consult for Electrolyte Monitoring and Replacement   Recent Labs: Potassium (mmol/L)  Date Value  09/01/2022 3.2 (L)   Magnesium (mg/dL)  Date Value  16/02/9603 1.9   Calcium (mg/dL)  Date Value  54/01/8118 7.0 (L)   Albumin (g/dL)  Date Value  14/78/2956 2.8 (L)   Phosphorus (mg/dL)  Date Value  21/30/8657 1.9 (L)   Sodium (mmol/L)  Date Value  09/01/2022 133 (L)   Assessment: 89 y/o F with medical history including HTN, HLD, diastolic CHF, gout, depression, CKD, neuropathy, C diff, breast cancer who presented from independent living facility with hematemesis, nausea, vomiting, diarrhea and chest pain. Patient currently admitted with Streptococcal group G bacteremia, Sapovirus gastroenteritis, and NSTEMI. Pharmacy consulted to assist with electrolyte monitoring and replacement as indicated.  Diet: Carb modified (appetite appears poor per chart review)  Goal of Therapy:  Electrolytes within normal limits  Plan:  --Na 133, stable. Will continue to monitor --K 3.2 and Phos 1.9, hypokalemia and hypophosphatemia; replace with Kphos IVPB x 1 dose (~78meq of K+) plus Kcl 20 meq po x 1 dose today. --Ca 7.2, continue OsCal 1000 mg elemental calcium BIDM + vitamin D3 --Follow-up electrolytes with AM labs tomorrow  Valaria Kohut Rodriguez-Guzman PharmD, BCPS 09/01/2022 7:48 AM

## 2022-09-01 NOTE — Progress Notes (Signed)
Date of Admission:  08/26/2022     ID: Tina Curtis is a 89 y.o. female Principal Problem:   Hematemesis Active Problems:   Gout   Anemia of chronic renal failure, stage 3b   Depression   Essential hypertension   Pneumonia due to infectious organism   NSTEMI (non-ST elevated myocardial infarction)   Hypertensive urgency   Hypokalemia   Hypocalcemia   Chronic diastolic CHF (congestive heart failure)   Acute blood loss anemia   Hypomagnesemia   Hypophosphatemia   Type II diabetes mellitus with renal manifestations   Rash   Chest pain   Sepsis due to Streptococcus species with acute hypoxic respiratory failure without septic shock   CHF NYHA class III, acute, diastolic   Bacteremia due to Streptococcus    Subjective: niece at bed side-   pt is feeling better Had MRI no evidence of infection No diarrhea No nausea or vomiting  Medications:   acidophilus  1 capsule Oral Daily   allopurinol  100 mg Oral Daily   anastrozole  1 mg Oral Daily   aspirin  81 mg Oral Daily   atorvastatin  40 mg Oral Daily   calcium carbonate  1,000 mg of elemental calcium Oral BID WC   cholecalciferol  2,000 Units Oral Daily   citalopram  10 mg Oral Daily   clopidogrel  75 mg Oral Daily   cyanocobalamin  1,000 mcg Oral Daily   DULoxetine  60 mg Oral Daily   empagliflozin  10 mg Oral Daily   enoxaparin (LOVENOX) injection  30 mg Subcutaneous Q24H   furosemide  40 mg Oral Daily   gabapentin  300 mg Oral QHS   hydrALAZINE  25 mg Oral TID   hydrocortisone cream   Topical BID   insulin aspart  0-5 Units Subcutaneous QHS   insulin aspart  0-9 Units Subcutaneous TID WC   isosorbide mononitrate  30 mg Oral Daily   metoprolol succinate  37.5 mg Oral QHS   multivitamin  1 tablet Oral Daily   pantoprazole  40 mg Oral Daily   [START ON 09/02/2022] phosphorus  250 mg Oral BID   [START ON 09/02/2022] potassium chloride  20 mEq Oral Daily   sodium chloride flush  10-40 mL Intracatheter Q12H    traZODone  25 mg Oral QHS    Objective: Vital signs in last 24 hours: Patient Vitals for the past 24 hrs:  BP Temp Temp src Pulse Resp SpO2 Weight  09/01/22 1638 132/72 98.7 F (37.1 C) Oral 87 20 96 % --  09/01/22 1135 (!) 169/80 98.3 F (36.8 C) Oral 74 16 98 % --  09/01/22 0853 -- -- -- -- -- -- 63.7 kg  09/01/22 0825 131/61 98.3 F (36.8 C) -- 72 17 93 % --  09/01/22 0458 (!) 141/68 98.3 F (36.8 C) -- 80 18 97 % --  09/01/22 0041 136/73 99.3 F (37.4 C) -- 88 20 95 % --      PHYSICAL EXAM:  General: Alert, cooperative, frail ing or Rhonchi. No rales. Heart: Regular rate and rhythm, no murmur, rub or gallop. Abdomen: Soft, non-tender,not distended. Bowel sounds normal. No masses Extremities: atraumatic, no cyanosis. No edema. No clubbing Skin: pinkish discoloration of skin over back No obvious rash Lymph: Cervical, supraclavicular normal. Neurologic: Grossly non-focal  Lab Results    Latest Ref Rng & Units 08/29/2022    5:23 AM 08/28/2022    2:14 AM 08/27/2022    7:42 AM  CBC  WBC 4.0 - 10.5 K/uL 13.1  14.1  13.3   Hemoglobin 12.0 - 15.0 g/dL 40.9  81.1  91.4   Hematocrit 36.0 - 46.0 % 29.7  29.0  31.5   Platelets 150 - 400 K/uL 194  165  154        Latest Ref Rng & Units 09/01/2022    5:29 AM 08/31/2022    5:26 AM 08/30/2022    5:51 PM  CMP  Glucose 70 - 99 mg/dL 782  956    BUN 8 - 23 mg/dL 18  21    Creatinine 2.13 - 1.00 mg/dL 0.86  5.78  4.69   Sodium 135 - 145 mmol/L 133  134    Potassium 3.5 - 5.1 mmol/L 3.2  3.6  3.4   Chloride 98 - 111 mmol/L 104  106    CO2 22 - 32 mmol/L 21  20    Calcium 8.9 - 10.3 mg/dL 7.0  7.2        Microbiology: [facility] 4/4 Group G strep Repeat blood culture NG   Assessment/Plan:  Streptococcus bacteremia  With sepsis High bioburden 4/4 Cannot attribute it to translocation  need to r/o deep seated infection-Low back pain - so will get MRI lumbar spine to r/o any infection  As MRI shows No evidence of infection,   will  do 2 weeks of IV antibiotic end date for ceftriaxone 09/09/22  and do a blood culture 2 weeks after that- 09/23/22 Pt does not want TEE  Gastroenteritis due to sapo virus- resolved  PCN allergy- has tolerated ceftriaxone and cefdinir in the past     Rash on her back- is fading- she has had this before -more like asteatotic eczema    H/o ca breast b/l mastectomy in 2021  Discussed the management with patient and her niece at bed side and the hospitalist ID will sign off- call if needed

## 2022-09-01 NOTE — Progress Notes (Signed)
[facility]  - Tina Curtis at [facility]   PATIENT NAME: Tina Curtis    MR#:  [id number]  DATE OF BIRTH:  [birth year]  SUBJECTIVE:  patient sitting out in the chair Niece from [place]  at bedside. Denies much back pain. Tolerating PO diet.  VITALS:  Blood pressure (!) 169/80, Curtis 74, temperature 98.3 F (36.8 C), temperature source Oral, resp. rate 16, height  (1.626 m), weight 63.7 kg, SpO2 98 %.  PHYSICAL EXAMINATION:   GENERAL:  89 y.o.-year-old patient with no acute distress. Weak, deconditioned LUNGS: decreased breath sounds bilaterally, no wheezing CARDIOVASCULAR: S1, S2 normal. No murmur  tachycardia ABDOMEN: Soft, nontender, nondistended. Bowel sounds present.  EXTREMITIES:trace  edema b/l.   Left arm midline+ NEUROLOGIC: nonfocal  patient is alert and awake SKIN erythematous rash on right arm, chest and flank-- rash fading  LABORATORY PANEL:  CBC Recent Labs  Lab 08/29/22 0523  WBC 13.1*  HGB 10.1*  HCT 29.7*  PLT 194     Chemistries  Recent Labs  Lab 08/26/22 0526 08/26/22 1835 08/31/22 0526 09/01/22 0529  NA 139   < > 134* 133*  K 2.5*   < > 3.6 3.2*  CL 106   < > 106 104  CO2 21*   < > 20* 21*  GLUCOSE 182*   < > 111* 113*  BUN 21   < > 21 18  CREATININE 1.34*   < > 1.55* 1.55*  CALCIUM 7.3*   < > 7.2* 7.0*  MG 1.4*   < > 1.9  --   AST 56*  --   --   --   ALT 35  --   --   --   ALKPHOS 72  --   --   --   BILITOT 1.1  --   --   --    < > = values in this interval not displayed.    Assessment and Plan  Tina Curtis is a 89 y.o. female with medical history significant of hypertension, hyperlipidemia, dCHF, gout, depression, CKD-3B, neuropathy, C. difficile, breast cancer, who presents with hematemesis, nausea, vomiting, diarrhea and chest pain.  4/13, and had CTA of chest/abdomen/pelvis and was diagnosed with gastroenteritis.   Patient is normally not using oxygen, but was found to have oxygen desaturation to 87-89% on room  air, which improved to 90-92% on 2 L oxygen. Of note, pt has rash in her left chest wall and left upper arm, not itchy or painful.   CXR: 1. The appearance of the chest is concerning for probable acute bronchitis, as above. Some degree of underlying interstitial lung disease is also suspected. 2. Aortic atherosclerosis  US venous doppler 1. No lower extremity DVT. 2. 5 cm left Baker's cyst.  NM perfusion study: Indetermidate  Sepsis due to Acute bronchopneumonia Streptococcal G Bacterimia --IV rocephin + zithromax --ID consultation with Tina Curtis further IV abx recs -- patient came in with fever of 103, tachycardia, tachypnea elevated white count and hypotension, chest x-ray suggestive bronchopneumonia --4/17--BC 4/4 positive for strept species --4/19--repeat BC negative so far --pt Tina Tina Curtis  OPAT Orders Ceftriaxone 2 grams IV every 24 hours Duration: 2 weeks End Date: 09/09/22  [facility] Care Per Protocol:   Labs weekly on Monday while on IV antibiotics: _X_ CBC with differential   _X_ CMP   Check 2 sets of blood culture on 09/23/22 _X_ Please pull PIC at completion of IV antibiotics   Fax weekly lab results  promptly to [phone]  Clinic Follow Up Appt:with Tina Curtis as needed  NSTEMI --pt has no underlying cardiac hisotry (per records, family) --had troponin 88-- [id number]--[id number]--3299--2330--no cp --Cardiology consult with Tina Curtis.  --pt received  IV  heparin for 48 hours now on ASA + plavix --Echo shows EF 60-70%  with mod to severe MR,TR, mild AR --cont statins, Imdur,toprol XL -- Cardiologist reviewed EKG, they did not think patient has A-fib.   Acute diastolic CHF Valvular regurgitation --IV lasix 20 mg bid --now d/ced --monitor creatinine--slight bump --input/output --appears clinically euvolemic  Viral GE --GI PCR stool positive for Sapovirus --treat symptoms --probiotic  Hypokalemia/hypomagnesimia/low phosphorus,  hypocalcemia --Pharmacy to replete electrolytes  Vomiting/hematemesis --serial hgbs table--Seen by GI Tina Curtis--no GI eval --IV ppi bid--change to po ppi  Type II diabetes mellitus with renal manifestations CKD IIIB: Recent A1c 9.0.  Poorly controlled.  Patient is taking Jardiance at home -SSI --pt follows with Tina Curtis as out pt --monitor creat   Anemia of chronic renal failure, stage 3b: Hgb 11.9 (12.4 on 08/22/22   Lower back pain acute on chronic -- patient rates are lower back pain as 3/10 at present. Discussed if she would like me to get lower lumbar spine MRI --pt and dter wants to hold MRI   Depression:  -Continue home medications   Rash: Etiology is not clear.  May be due to newly started Jardiance -pt reports her skin is sensitive and has had rash in the past few times. Recommend f/u dermatology --since unsure etiology will resume jardiance --improved a lot with hydrocortisone  PT/OT to see pt. Given the severity of illness and significant deconditioning patient will benefit from rehab. TOC for discharge planning  Procedures:midline 08/31/22 Family communication :Niece  Consults :cardiology, ID, GI CODE STATUS: DNR/DNI DVT Prophylaxis :lovenox Level of care: Progressive Status is: Inpatient Remains inpatient appropriate because: overall medically at baseline. Patient is awaiting insurance authorization.    TOTAL  TIME TAKING CARE OF THIS PATIENT: 35 minutes.  >50% time spent on counselling and coordination of care  Note: This dictation was prepared with Dragon dictation along with smaller phrase technology. Any transcriptional errors that result from this process are unintentional.  Enedina Finner M.D    [facility]   CC: Primary care physician; Tina Rud, MD

## 2022-09-01 NOTE — Progress Notes (Signed)
Physical Therapy Treatment Patient Details Name: Tina Curtis MRN: [id number] [name]: Apr 28, 1927 Today's Date: 09/01/2022   History of Present Illness presented to ER secondary to hematemesis, nausea/vomiting, diarrhea and chest pain; admitted for management of streptococcus bacteremia, possible PNA and NSTEMI (peak troponin 3677, managed medically)    PT Comments    Pt asleep but awakens easily.  She transitions to EOB with min guard.  Steady in sitting.  Stands and wals to bathroom to void and despite pad in disposable pants it does not manage output.  Care provided then she is able to walk x 2 laps with slow steady gait.  She does c/o some pain with deep breathing in chest but does feel it is muscular/arthritic in nature and not cardiac.  RN aware and secure char to MD to make her aware.    Recommendations for follow up therapy are one component of a multi-disciplinary discharge planning process, led by the attending physician.  Recommendations may be updated based on patient status, additional functional criteria and insurance authorization.  Follow Up Recommendations       Assistance Recommended at Discharge Intermittent Supervision/Assistance  Patient can return home with the following A little help with walking and/or transfers;Assistance with cooking/housework;Assist for transportation;Help with stairs or ramp for entrance   Equipment Recommendations       Recommendations for Other Services       Precautions / Restrictions Precautions Precautions: Fall Restrictions Weight Bearing Restrictions: No     Mobility  Bed Mobility Overal bed mobility: Needs Assistance Bed Mobility: Supine to Sit     Supine to sit: Min guard          Transfers Overall transfer level: Needs assistance Equipment used: Rolling walker (2 wheels) Transfers: Sit to/from Stand Sit to Stand: Mod assist, Min assist                Ambulation/Gait   Gait Distance (Feet): 320  Feet Assistive device: Rolling walker (2 wheels) Gait Pattern/deviations: Step-through pattern, Decreased step length - right, Decreased step length - left, Narrow base of support Gait velocity: decreased         Stairs             Wheelchair Mobility    Modified Rankin (Stroke Patients Only)       Balance Overall balance assessment: Needs assistance Sitting-balance support: No upper extremity supported, Feet supported Sitting balance-Leahy Scale: Good     Standing balance support: Bilateral upper extremity supported, Reliant on assistive device for balance Standing balance-Leahy Scale: Fair                              Cognition Arousal/Alertness: Awake/alert Behavior During Therapy: WFL for tasks assessed/performed Overall Cognitive Status: Within Functional Limits for tasks assessed                                          Exercises Other Exercises Other Exercises: to bathroom, inc of urine in bed, floor and on way to bathroom.  recommend brief.  saturates pad in disposable underwear    General Comments        Pertinent Vitals/Pain Pain Assessment Pain Assessment: Faces Faces Pain Scale: Hurts even more Pain Location: chest but pt stated it feels more muscular than cardiac Pain Descriptors / Indicators: Sore, Grimacing Pain Intervention(s): Limited activity  within patient's tolerance, Monitored during session    Home Living                          Prior Function            PT Goals (current goals can now be found in the care plan section) Progress towards PT goals: Progressing toward goals    Frequency    Min 3X/week      PT Plan Current plan remains appropriate    Co-evaluation              AM-PAC PT "6 Clicks" Mobility   Outcome Measure  Help needed turning from your back to your side while in a flat bed without using bedrails?: None Help needed moving from lying on your back to  sitting on the side of a flat bed without using bedrails?: A Little Help needed moving to and from a bed to a chair (including a wheelchair)?: A Little Help needed standing up from a chair using your arms (e.g., wheelchair or bedside chair)?: A Little Help needed to walk in hospital room?: A Little Help needed climbing 3-5 steps with a railing? : A Lot 6 Click Score: 18    End of Session Equipment Utilized During Treatment: Gait belt Activity Tolerance: Patient tolerated treatment well Patient left: in chair;with call bell/phone within reach;with chair alarm set;with family/visitor present Nurse Communication: Mobility status PT Visit Diagnosis: Muscle weakness (generalized) (M62.81);Difficulty in walking, not elsewhere classified (R26.2)     Time: 1610-9604 PT Time Calculation (min) (ACUTE ONLY): 35 min  Charges:  $Gait Training: 23-37 mins                   Danielle Dess, PTA 09/01/22, 4:38 PM

## 2022-09-01 NOTE — Treatment Plan (Signed)
Diagnosis: Streptococcus bacteremia Baseline Creatinine 1.7   Allergies  Allergen Reactions   Penicillins Anaphylaxis   Drug Ingredient [Zinc]     OPAT Orders Ceftriaxone 2 grams IV every 24 hours Duration: 2 weeks End Date: 09/09/22   [facility] Care Per Protocol:  Labs weekly on Monday while on IV antibiotics: _X_ CBC with differential  _X_ CMP  Check 2 sets of blood culture on 5/15.24 _X_ Please pull PIC at completion of IV antibiotics   Fax weekly lab results  promptly to [phone]  Clinic Follow Up Appt:with Dr.Lonzy Mato as needed   Call [phone] with any questions

## 2022-09-01 NOTE — Progress Notes (Signed)
Mobility Specialist - Progress Note    09/01/22 1000  Mobility  Activity Ambulated with assistance in hallway;Stood at bedside;Dangled on edge of bed  Level of Assistance Minimal assist, patient does 75% or more  Assistive Device Front wheel walker  Distance Ambulated (ft) 200 ft  Range of Motion/Exercises Active  Activity Response Tolerated well  Mobility Referral Yes  $Mobility charge 1 Mobility   Pt resting in recliner on RA upon entry. Pt STS MinA and ambulates to hallway around NS 1 lap with RW. Pt gait is moderately stable but pt had difficulty walking straight with walk. Pt given manual cues to correct direction. Pt returned to recliner and left with needs in reach. Pt chair alarm activated.   Johnathan Hausen Mobility Specialist 09/01/22, 10:41 AM

## 2022-09-01 NOTE — Progress Notes (Addendum)
PHARMACY CONSULT NOTE FOR:  OUTPATIENT  PARENTERAL ANTIBIOTIC THERAPY (OPAT)  Indication: Streptococcus bacteremia Regimen: Ceftriaxone 2gm IV q24h End date: 09/09/2022  Labs - Once weekly:  CBC/D and CMP, Please pull PIC/mildine at completion of IV antibiotics  Fax weekly lab results  promptly to [phone] Check 2 sets of blood culture on 09/23/22  IV antibiotic discharge orders are pended. To discharging provider:  please sign these orders via discharge navigator,  Select New Orders & click on the button choice - Manage This Unsigned Work.     Thank you for allowing pharmacy to be a part of this patient's care.  Juliette Alcide, PharmD, BCPS, BCIDP Work Cell: [phone] 09/01/2022 9:17 AM

## 2022-09-02 ENCOUNTER — Encounter: Payer: Self-pay | Admitting: Internal Medicine

## 2022-09-02 DIAGNOSIS — K920 Hematemesis: Secondary | ICD-10-CM | POA: Diagnosis not present

## 2022-09-02 LAB — CULTURE, BLOOD (ROUTINE X 2): Special Requests: ADEQUATE

## 2022-09-02 LAB — BASIC METABOLIC PANEL
Anion gap: 7 (ref 5–15)
BUN: 21 mg/dL (ref 8–23)
CO2: 21 mmol/L — ABNORMAL LOW (ref 22–32)
Calcium: 6.6 mg/dL — ABNORMAL LOW (ref 8.9–10.3)
Chloride: 108 mmol/L (ref 98–111)
Creatinine, Ser: 1.64 mg/dL — ABNORMAL HIGH (ref 0.44–1.00)
GFR, Estimated: 29 mL/min — ABNORMAL LOW (ref >60)
Glucose, Bld: 110 mg/dL — ABNORMAL HIGH (ref 70–99)
Potassium: 3.7 mmol/L (ref 3.5–5.1)
Sodium: 136 mmol/L (ref 135–145)

## 2022-09-02 LAB — GLUCOSE, CAPILLARY
Glucose-Capillary: 102 mg/dL — ABNORMAL HIGH (ref 70–99)
Glucose-Capillary: 136 mg/dL — ABNORMAL HIGH (ref 70–99)

## 2022-09-02 MED ORDER — PANTOPRAZOLE SODIUM 40 MG PO TBEC: 40.0000 mg | DELAYED_RELEASE_TABLET | Freq: Every day | ORAL | Status: DC

## 2022-09-02 MED ORDER — HYDROCORTISONE 0.5 % EX CREA: TOPICAL_CREAM | Freq: Two times a day (BID) | CUTANEOUS | 0 refills | Status: AC

## 2022-09-02 MED ORDER — PROSIGHT PO TABS: 1.0000 | ORAL_TABLET | Freq: Every day | ORAL | 0 refills | Status: DC

## 2022-09-02 MED ORDER — CEFTRIAXONE IV (FOR PTA / DISCHARGE USE ONLY): 2.0000 g | INTRAVENOUS | 0 refills | Status: AC

## 2022-09-02 MED ORDER — ISOSORBIDE MONONITRATE ER 30 MG PO TB24: 30.0000 mg | ORAL_TABLET | Freq: Every day | ORAL | Status: DC

## 2022-09-02 MED ORDER — ONDANSETRON 4 MG PO TBDP: 4.0000 mg | ORAL_TABLET | Freq: Three times a day (TID) | ORAL | 0 refills | Status: AC | PRN

## 2022-09-02 MED ORDER — TRAMADOL HCL 50 MG PO TABS: 50.0000 mg | ORAL_TABLET | Freq: Two times a day (BID) | ORAL | 0 refills | Status: DC | PRN

## 2022-09-02 MED ORDER — CALCIUM CARBONATE 1250 (500 CA) MG PO TABS: 2500.0000 mg | ORAL_TABLET | Freq: Two times a day (BID) | ORAL | Status: DC

## 2022-09-02 NOTE — Discharge Summary (Signed)
Physician Discharge Summary  Tina Curtis RUE:[id number] [name]: 12/13/1926 [name]: 08/26/2022  PCP: [name], MD  Admit date: 08/26/2022 Discharge date: 09/02/2022  Admitted From: Home Disposition:  SNF  Recommendations for Outpatient Follow-up:  Follow up with PCP in 1-2 weeks Please obtain CMP/CBC in one week your next doctors visit.  IV Rocephin 2g daily; last day 09/09/22. REPEAT BLOOD CULTURES ON 09/23/22.  Follow up outpatient ID, Pull out PICC line once cleared by their service.  PPI PO BID AC   Home Health: Equipment/Devices: Discharge Condition: Stable CODE STATUS:  Diet recommendation:   Brief/Interim Summary: 89 year old with history of HTN, HLD, diastolic CHF, gout, depression, CKD stage IIIb, neuropathy, C. difficile, breast cancer comes to the hospital with hematemesis, nausea, vomiting, diarrhea and chest pain.  CTA chest abdomen pelvis showed gastroenteritis.  Also she was diagnosed with acute bronchopneumonia and streptococcal bacteremia.  Lower extremity Dopplers was negative, NM perfusion study was indeterminate.  Infectious disease was consulted, repeat cultures remain negative.  Eventually was decided to treat total 2 weeks of IV Rocephin, last day 09/09/2022. Hospital course also complicated by NSTEMI and patient was seen by cardiology.  She was placed on 48 hours of IV heparin, echocardiogram showed EF of 60 to 70% with moderate to severe MR/TR.  Medications were adjusted.  There was also some evidence of fluid overload requiring IV Lasix.  GI stool study showed Sapovirus and was treated symptomatically.  She was also seen by GI team who recommended PPI PT/OT recommended SNF, arrangements were made.     Sepsis due to Acute bronchopneumonia Streptococcal G Bacterimia Patient seen by infectious disease, repeat cultures remain negative at this time.  Last set of cultures are negative or from 4/19.  For now patient will be on IV Rocephin 2 g every 24 hours, last day  5/1.  This will complete total 2-week course. Weekly CBC with differential/CMP.  Plan is to check 2 sets of cultures on 5/15, if negative PICC line can be pulled out.  Outpatient follow-up with infectious disease we will arrange by their service     NSTEMI No prior cardiac history, troponins peaked at 3299.  Remains chest pain-free.  Echocardiogram shows EF of 60 to 70%, moderate to severe MR/TR.  Seen by cardiology, completed 48 hours of IV heparin.  Medication management   Acute diastolic CHF Valvular regurgitation Received IV Lasix, now euvolemic.   Viral GE --GI PCR stool positive for Sapovirus Symptoms have resolved   Hypokalemia/hypomagnesimia/low phosphorus, hypocalcemia As needed repletion   Vomiting/hematemesis Seen by GI team, recommended PPI   Type II diabetes mellitus with renal manifestations CKD IIIB:  Recent A1c 9.0.  Poorly controlled.  Resume home regimen   Anemia of chronic renal failure, stage 3b:  Hgb 11.9 (12.4 on 08/22/22   Lower back pain acute on chronic MRI showing evidence of reactive changes but appears to be chronic and arthritic in nature versus infection.  This should closely be monitored in outpatient setting especially she is at a risk of lumbar osteomyelitis in the setting of bacteremia   Depression:  -Continue home medications   Rash:  -Etiology is unclear but has nearly resolved with hydrocortisone.  Recommend outpatient follow-up with dermatology   PT/OT to see pt. Given the severity of illness and significant deconditioning patient will benefit from rehab. TOC for discharge planning    Discharge Diagnoses:  Principal Problem:   Hematemesis Active Problems:   Acute blood loss anemia   Essential hypertension  Hypertensive urgency   Chronic diastolic CHF (congestive heart failure)   NSTEMI (non-ST elevated myocardial infarction)   Chest pain   Hypokalemia   Hypocalcemia   Hypomagnesemia   Hypophosphatemia   Type II diabetes  mellitus with renal manifestations   Anemia of chronic renal failure, stage 3b   Gout   Depression   Rash   Pneumonia due to infectious organism   Sepsis due to Streptococcus species with acute hypoxic respiratory failure without septic shock   CHF NYHA class III, acute, diastolic   Bacteremia due to Streptococcus      Consultations: ID GI   Subjective: Doing ok no new complaints.  Family at bedside.   Discharge Exam: Vitals:   09/02/22 0418 09/02/22 0913  BP: (!) 119/55 (!) 142/75  Pulse: 70 69  Resp: 18 18  Temp: 98.4 F (36.9 C) 98.8 F (37.1 C)  SpO2: 98%    Vitals:   09/02/22 0010 09/02/22 0418 09/02/22 0500 09/02/22 0913  BP: (!) 116/57 (!) 119/55  (!) 142/75  Pulse: 77 70  69  Resp: 18 18  18   Temp: 98.5 F (36.9 C) 98.4 F (36.9 C)  98.8 F (37.1 C)  TempSrc: Oral Oral  Oral  SpO2: 95% 98%    Weight:   63.7 kg   Height:        General: Pt is alert, awake, not in acute distress Cardiovascular: RRR, S1/S2 +, no rubs, no gallops Respiratory: CTA bilaterally, no wheezing, no rhonchi Abdominal: Soft, NT, ND, bowel sounds + Extremities: no edema, no cyanosis  Discharge Instructions  Discharge Instructions     Amb Referral to Cardiac Rehabilitation   Complete by: As directed    Diagnosis: NSTEMI   After initial evaluation and assessments completed: Virtual Based Care may be provided alone or in conjunction with Phase 2 Cardiac Rehab based on patient barriers.: Yes   Intensive Cardiac Rehabilitation (ICR) [facility] location only OR Traditional Cardiac Rehabilitation (TCR) *If criteria for ICR are not met will enroll in TCR [facility] only): Yes   Home infusion instructions   Complete by: As directed    Instructions: Flushing of vascular access device: 0.9% NaCl pre/post medication administration and prn patency; Heparin 100 u/ml, 5ml for implanted ports and Heparin 10u/ml, 5ml for all other central venous catheters.      Allergies as of 09/02/2022        Reactions   Penicillins Anaphylaxis   Drug Ingredient [zinc]         Medication List     TAKE these medications    albuterol 108 (90 Base) MCG/ACT inhaler Commonly known as: VENTOLIN HFA Inhale 2 puffs into the lungs every 6 (six) hours as needed for wheezing or shortness of breath.   allopurinol 100 MG tablet Commonly known as: ZYLOPRIM Take 100 mg by mouth daily.   anastrozole 1 MG tablet Commonly known as: ARIMIDEX TAKE 1 TABLET(1 MG) BY MOUTH DAILY. DO NOT. START IF RASH IS NOT BETTER What changed: See the new instructions.   aspirin EC 81 MG tablet Take 81 mg by mouth daily.   calcium carbonate 1250 (500 Ca) MG tablet Commonly known as: OS-CAL - dosed in mg of elemental calcium Take 2 tablets (2,500 mg total) by mouth 2 (two) times daily with a meal.   cefTRIAXone  IVPB Commonly known as: ROCEPHIN Inject 2 g into the vein daily for 7 days. Indication:  Streptococcus bacteremia Last Day of Therapy:  09/09/2022 Labs - Once weekly:  CBC/D and CMP, Please pull PIC at completion of IV antibiotics  Fax weekly lab results  promptly to [phone] Check 2 sets of blood culture on 09/23/22 Method of administration: IV Push Method of administration may be changed at the discretion of facility/pharmacy   citalopram 10 MG tablet Commonly known as: CELEXA Take 10 mg by mouth daily.   clopidogrel 75 MG tablet Commonly known as: PLAVIX TAKE 1 TABLET BY MOUTH EVERY DAY   cyanocobalamin 1000 MCG tablet Commonly known as: VITAMIN B12 Take 1,000 mcg by mouth daily.   docusate sodium 100 MG capsule Commonly known as: COLACE Take 100 mg by mouth daily as needed for mild constipation.   DULoxetine 60 MG capsule Commonly known as: CYMBALTA Take 60 mg by mouth daily.   empagliflozin 10 MG Tabs tablet Commonly known as: JARDIANCE Take 10 mg by mouth daily.   Ergocalciferol 50 MCG (2000 UT) Tabs Take 2,000 Units by mouth daily.   furosemide 40 MG tablet Commonly  known as: LASIX Take 1 tablet (40 mg total) by mouth daily.   gabapentin 300 MG capsule Commonly known as: NEURONTIN Take 300 mg by mouth at bedtime.   hydrALAZINE 25 MG tablet Commonly known as: APRESOLINE Take 25 mg by mouth 3 (three) times daily.   hydrocortisone cream 0.5 % Apply topically 2 (two) times daily for 5 days.   ICAPS AREDS 2 PO Take 2 capsules by mouth daily. What changed: Another medication with the same name was added. Make sure you understand how and when to take each.   multivitamin Tabs tablet Take 1 tablet by mouth daily. What changed: You were already taking a medication with the same name, and this prescription was added. Make sure you understand how and when to take each.   isosorbide mononitrate 30 MG 24 hr tablet Commonly known as: IMDUR Take 1 tablet (30 mg total) by mouth daily.   loratadine 10 MG tablet Commonly known as: CLARITIN Take 10 mg by mouth daily as needed for allergies. AM   metoprolol succinate 25 MG 24 hr tablet Commonly known as: TOPROL-XL Take 37.5 mg by mouth at bedtime.   ondansetron 4 MG disintegrating tablet Commonly known as: ZOFRAN-ODT Take 1 tablet (4 mg total) by mouth every 8 (eight) hours as needed for up to 5 days.   pantoprazole 40 MG tablet Commonly known as: PROTONIX Take 1 tablet (40 mg total) by mouth daily.   simvastatin 20 MG tablet Commonly known as: ZOCOR Take 20 mg by mouth every evening. PM   traMADol 50 MG tablet Commonly known as: ULTRAM Take 1 tablet (50 mg total) by mouth every 12 (twelve) hours as needed for moderate pain.   triamcinolone cream 0.1 % Commonly known as: KENALOG Apply 1 Application topically 2 (two) times daily.               Home Infusion Instuctions  (From admission, onward)           Start     Ordered   09/02/22 0000  Home infusion instructions       Question:  Instructions  Answer:  Flushing of vascular access device: 0.9% NaCl pre/post medication  administration and prn patency; Heparin 100 u/ml, 5ml for implanted ports and Heparin 10u/ml, 5ml for all other central venous catheters.   09/02/22 0834            Contact information for follow-up providers     Paraschos, Alexander, MD. Go in 1 week(s).  Specialty: Cardiology Contact information: [address] [facility] [place] [address] [phone]         [name], MD Follow up in 1 week(s).   Specialty: Family Medicine Contact information: [address] [facility] [place] [address] [phone]              Contact information for after-discharge care     Destination     [facility] .   Service: Skilled Nursing Contact information: [address] [place] [address] [phone]                    Allergies  Allergen Reactions   Penicillins Anaphylaxis   Drug Ingredient [Zinc]     You were cared for by a hospitalist during your hospital stay. If you have any questions about your discharge medications or the care you received while you were in the hospital after you are discharged, you can call the unit and asked to speak with the hospitalist on call if the hospitalist that took care of you is not available. Once you are discharged, your primary care physician will handle any further medical issues. Please note that no refills for any discharge medications will be authorized once you are discharged, as it is imperative that you return to your primary care physician (or establish a relationship with a primary care physician if you do not have one) for your aftercare needs so that they can reassess your need for medications and monitor your lab values.  You were cared for by a hospitalist during your hospital stay. If you have any questions about your discharge medications or the care you received while you were in the hospital  after you are discharged, you can call the unit and asked to speak with the hospitalist on call if the hospitalist that took care of you is not available. Once you are discharged, your primary care physician will handle any further medical issues. Please note that NO REFILLS for any discharge medications will be authorized once you are discharged, as it is imperative that you return to your primary care physician (or establish a relationship with a primary care physician if you do not have one) for your aftercare needs so that they can reassess your need for medications and monitor your lab values.  Please request your Prim.MD to go over all Hospital Tests and Procedure/Radiological results at the follow up, please get all Hospital records sent to your Prim MD by signing hospital release before you go home.  Get CBC, CMP, 2 view Chest X ray checked  by Primary MD during your next visit or SNF MD in 5-7 days ( we routinely change or add medications that can affect your baseline labs and fluid status, therefore we recommend that you get the mentioned basic workup next visit with your PCP, your PCP may decide not to get them or add new tests based on their clinical decision)  On your next visit with your primary care physician please Get Medicines reviewed and adjusted.  If you experience worsening of your admission symptoms, develop shortness of breath, life threatening emergency, suicidal or homicidal thoughts you must seek medical attention immediately by calling 911 or calling your MD immediately  if symptoms less severe.  You Must read complete instructions/literature along with all the possible adverse reactions/side effects for all the Medicines you take and that have been prescribed to you.  Take any new Medicines after you have completely understood and accpet all the possible adverse reactions/side effects.   Do not drive, operate heavy machinery, perform activities at heights, swimming or  participation in water activities or provide baby sitting services if your were admitted for syncope or siezures until you have seen by Primary MD or a Neurologist and advised to do so again.  Do not drive when taking Pain medications.   Procedures/Studies: MR Lumbar Spine W Wo Contrast  Result Date: 09/01/2022 CLINICAL DATA:  Initial evaluation for back pain. EXAM: MRI LUMBAR SPINE WITHOUT AND WITH CONTRAST TECHNIQUE: Multiplanar and multiecho pulse sequences of the lumbar spine were obtained without and with intravenous contrast. CONTRAST:  6mL GADAVIST GADOBUTROL 1 MMOL/ML IV SOLN COMPARISON:  Prior study from 08/22/2022. FINDINGS: Segmentation: Standard. Lowest well-formed disc space labeled the L5-S1 level. Alignment: Sigmoid scoliotic curvature of the lumbar spine. 3 mm anterolisthesis of L4 on L5, with 5 mm anterolisthesis of L5 on S1. Findings chronic and facet mediated. Vertebrae: Vertebral body height maintained without acute or chronic fracture. Bone marrow signal intensity within normal limits. No discrete or worrisome osseous lesions. Reactive marrow edema and enhancement present about the left L4-5 facet, most likely due to degenerative facet arthritis. No other abnormal marrow edema or enhancement. Conus medullaris and cauda equina: Conus extends to the L2 level. Conus and cauda equina appear normal. Paraspinal and other soft tissues: Prominent edema and enhancement seen within the left lower posterior paraspinous soft tissues adjacent to the left L4-5 facet (series 10, image 12). Finding is most likely due to adjacent facet arthritis. No drainable fluid collections. Paraspinous soft tissues demonstrate no other acute finding. Aortic atherosclerosis with 3.6 cm infrarenal aortic aneurysm. Few scattered subcentimeter T2 hyperintense cyst noted about the visualized right kidney, likely benign, with no follow-up imaging recommended. Asymmetric left renal atrophy noted. 1 cm benign appearing cyst  noted within the spleen. Disc levels: T12-L1: Disc desiccation with mild disc bulge and endplate spurring. Mild facet hypertrophy. No stenosis. L1-2: Negative interspace. Mild facet hypertrophy. No canal or foraminal stenosis. L2-3: Degenerative intervertebral disc space narrowing with diffuse disc bulge, slightly asymmetric to the right. Mild bilateral facet hypertrophy. No spinal stenosis. Foramina remain adequately patent. L3-4: Degenerative intervertebral disc space narrowing with diffuse disc bulge and disc desiccation. Associated reactive endplate change with marginal endplate osteophytic spurring. Moderate left with mild right facet arthrosis. Resultant mild canal with left lateral recess stenosis. Mild left L3 foraminal narrowing. Right neural foramina remains patent. L4-5: Disc desiccation with diffuse disc bulge. Superimposed small central disc protrusion with slight inferior migration. Moderate left greater than right facet arthrosis with associated small joint effusions. Reactive marrow edema and enhancement on the left. Resultant mild canal with bilateral subarticular stenosis. Mild bilateral L4 foraminal narrowing. L5-S1: Anterolisthesis. Diffuse disc bulge with disc desiccation. Severe right with moderate left facet arthrosis. Resultant mild narrowing of the right lateral recess. Central canal remains patent. Mild to moderate right L5 foraminal stenosis. Left neural foramina remains patent. IMPRESSION: 1. Reactive edema and enhancement about the left L4-5 facet, most likely due to degenerative facet arthritis. Although possible infection with septic arthritis is felt to be unlikely, correlation with history and laboratory values is recommended. 2. No other acute abnormality within the lumbar spine. 3. Multifactorial degenerative changes at L3-4 through L5-S1 with resultant mild canal with bilateral lateral recess stenosis as above. 4. 3.6 cm infrarenal aortic aneurysm. Recommend follow-up ultrasound  every 2 years. This recommendation follows ACR consensus  guidelines: White Paper of the ACR Incidental Findings Committee II on Vascular Findings. J Am Coll Radiol 2013; 10:789-794. Aortic Atherosclerosis (ICD10-I70.0). Electronically Signed   By: Rise Mu M.D.   On: 09/01/2022 05:23   ECHOCARDIOGRAM COMPLETE  Result Date: 08/27/2022    ECHOCARDIOGRAM REPORT   Patient Name:   JORDY HEWINS Date of Exam: 08/27/2022 Medical Rec #:  [id number]     Height:       64.0 in Accession #:    [id number]    Weight:       132.3 lb Date of Birth:  1926-05-27    BSA:          1.641 m Patient Age:    89 years      BP:           136/66 mmHg Patient Gender: F             HR:           67 bpm. Exam Location:  [facility] Procedure: 2D Echo, Cardiac Doppler and Color Doppler Indications:     Elevated Troponin  History:         Patient has no prior history of Echocardiogram examinations.                  CHF, Acute MI, Signs/Symptoms:Chest Pain,                  Dizziness/Lightheadedness and Fatigue; Risk                  Factors:Hypertension and Diabetes. Breast CA.  Sonographer:     Mikki Harbor Referring Phys:  [id number] [facility] MICHELLE TANG Diagnosing Phys: Marcina Millard MD IMPRESSIONS  1. Left ventricular ejection fraction, by estimation, is 60 to 65%. The left ventricle has normal function. The left ventricle has no regional wall motion abnormalities. Left ventricular diastolic parameters are consistent with Grade I diastolic dysfunction (impaired relaxation).  2. Right ventricular systolic function is normal. The right ventricular size is normal. There is moderately elevated pulmonary artery systolic pressure.  3. Left atrial size was mild to moderately dilated.  4. Right atrial size was mild to moderately dilated.  5. The mitral valve is normal in structure. Moderate to severe mitral valve regurgitation. No evidence of mitral stenosis.  6. Tricuspid valve regurgitation is moderate to severe.  7. The aortic valve is  normal in structure. Aortic valve regurgitation is mild. No aortic stenosis is present.  8. The inferior vena cava is normal in size with greater than 50% respiratory variability, suggesting right atrial pressure of 3 mmHg. FINDINGS  Left Ventricle: Left ventricular ejection fraction, by estimation, is 60 to 65%. The left ventricle has normal function. The left ventricle has no regional wall motion abnormalities. The left ventricular internal cavity size was normal in size. There is  no left ventricular hypertrophy. Left ventricular diastolic parameters are consistent with Grade I diastolic dysfunction (impaired relaxation). Right Ventricle: The right ventricular size is normal. No increase in right ventricular wall thickness. Right ventricular systolic function is normal. There is moderately elevated pulmonary artery systolic pressure. The tricuspid regurgitant velocity is 3.60 m/s, and with an assumed right atrial pressure of 8 mmHg, the estimated right ventricular systolic pressure is 59.8 mmHg. Left Atrium: Left atrial size was mild to moderately dilated. Right Atrium: Right atrial size was mild to moderately dilated. Pericardium: There is no evidence of pericardial effusion. Mitral Valve: The mitral valve is normal in structure. Moderate  to severe mitral valve regurgitation. No evidence of mitral valve stenosis. MV peak gradient, 4.8 mmHg. The mean mitral valve gradient is 1.0 mmHg. Tricuspid Valve: The tricuspid valve is normal in structure. Tricuspid valve regurgitation is moderate to severe. No evidence of tricuspid stenosis. Aortic Valve: The aortic valve is normal in structure. Aortic valve regurgitation is mild. No aortic stenosis is present. Aortic valve mean gradient measures 4.0 mmHg. Aortic valve peak gradient measures 10.1 mmHg. Aortic valve area, by VTI measures 2.75  cm. Pulmonic Valve: The pulmonic valve was normal in structure. Pulmonic valve regurgitation is not visualized. No evidence of  pulmonic stenosis. Aorta: The aortic root is normal in size and structure. Venous: The inferior vena cava is normal in size with greater than 50% respiratory variability, suggesting right atrial pressure of 3 mmHg. IAS/Shunts: No atrial level shunt detected by color flow Doppler.  LEFT VENTRICLE PLAX 2D LVIDd:         4.50 cm   Diastology LVIDs:         2.80 cm   LV e' medial:    6.42 cm/s LV PW:         1.00 cm   LV E/e' medial:  17.0 LV IVS:        1.30 cm   LV e' lateral:   8.70 cm/s LVOT diam:     2.10 cm   LV E/e' lateral: 12.5 LV SV:         86 LV SV Index:   52 LVOT Area:     3.46 cm  RIGHT VENTRICLE RV Basal diam:  3.65 cm RV Mid diam:    2.70 cm RV S prime:     8.92 cm/s TAPSE (M-mode): 2.0 cm LEFT ATRIUM              Index        RIGHT ATRIUM           Index LA diam:        4.10 cm  2.50 cm/m   RA Area:     19.20 cm LA Vol (A2C):   115.0 ml 70.07 ml/m  RA Volume:   50.40 ml  30.71 ml/m LA Vol (A4C):   61.7 ml  37.60 ml/m LA Biplane Vol: 85.5 ml  52.10 ml/m  AORTIC VALVE                    PULMONIC VALVE AV Area (Vmax):    2.03 cm     PV Vmax:       0.90 m/s AV Area (Vmean):   2.40 cm     PV Peak grad:  3.2 mmHg AV Area (VTI):     2.75 cm AV Vmax:           159.00 cm/s AV Vmean:          91.500 cm/s AV VTI:            0.311 m AV Peak Grad:      10.1 mmHg AV Mean Grad:      4.0 mmHg LVOT Vmax:         93.40 cm/s LVOT Vmean:        63.300 cm/s LVOT VTI:          0.247 m LVOT/AV VTI ratio: 0.79  AORTA Ao Root diam: 3.30 cm Ao Asc diam:  4.10 cm MITRAL VALVE                TRICUSPID  VALVE MV Area (PHT): 4.65 cm     TR Peak grad:   51.8 mmHg MV Area VTI:   2.77 cm     TR Vmax:        360.00 cm/s MV Peak grad:  4.8 mmHg MV Mean grad:  1.0 mmHg     SHUNTS MV Vmax:       1.10 m/s     Systemic VTI:  0.25 m MV Vmean:      54.4 cm/s    Systemic Diam: 2.10 cm MV Decel Time: 163 msec MV E velocity: 109.00 cm/s MV A velocity: 77.00 cm/s MV E/A ratio:  1.42 Marcina Millard MD Electronically signed by  Marcina Millard MD Signature Date/Time: 08/27/2022/1:16:52 PM    Final    US Venous Img Lower Bilateral (DVT)  Result Date: 08/26/2022 CLINICAL DATA:  [id number] Positive D dimer [id number]. EXAM: BILATERAL LOWER EXTREMITY VENOUS DOPPLER ULTRASOUND TECHNIQUE: Gray-scale sonography with compression, as well as color and duplex ultrasound, were performed to evaluate the deep venous system(s) from the level of the common femoral vein through the popliteal and proximal calf veins. COMPARISON:  None Available. FINDINGS: VENOUS Normal compressibility of the common femoral, superficial femoral, and popliteal veins, as well as the visualized calf veins. Visualized portions of profunda femoral vein and great saphenous vein unremarkable. No filling defects to suggest DVT on grayscale or color Doppler imaging. Doppler waveforms show normal direction of venous flow, normal respiratory plasticity and response to augmentation. OTHER Cyst in the left popliteal fossa measuring up to 5 cm, likely Baker's cyst. Limitations: none IMPRESSION: 1. No lower extremity DVT. 2. 5 cm left Baker's cyst. Electronically Signed   By: Orvan Falconer M.D.   On: 08/26/2022 15:45   NM Pulmonary Perfusion  Result Date: 08/26/2022 CLINICAL DATA:  Pulmonary embolus suspected EXAM: NUCLEAR MEDICINE PERFUSION LUNG SCAN TECHNIQUE: Perfusion images were obtained in multiple projections after intravenous injection of radiopharmaceutical. Ventilation scans intentionally deferred if perfusion scan and chest x-ray adequate for interpretation during COVID 19 epidemic. RADIOPHARMACEUTICALS:  4.2 mCi Tc-17m MAA IV COMPARISON:  None Available. FINDINGS: Bilateral mild-to-moderate perfusion defects. No large perfusion defects. IMPRESSION: Nondiagnostic (intermediate probability). Correlate with lower extremity ultrasound and consider chest CTA for further evaluation. Electronically Signed   By: Allegra Lai M.D.   On: 08/26/2022 15:10   DG Chest Port 1  View  Result Date: 08/26/2022 CLINICAL DATA:  89 year old female with history of hematemesis and generalized weakness. EXAM: PORTABLE CHEST 1 VIEW COMPARISON:  Chest x-ray 06/07/2022. FINDINGS: Lung volumes are low. Bibasilar opacities favored to reflect areas of subsegmental atelectasis. Diffuse interstitial prominence and widespread peribronchial cuffing. No definite pleural effusions. No pneumothorax. No evidence of pulmonary edema. Heart size is normal. Upper mediastinal contours are within normal limits allowing for patient positioning. Atherosclerotic calcifications in the thoracic aorta. IMPRESSION: 1. The appearance of the chest is concerning for probable acute bronchitis, as above. Some degree of underlying interstitial lung disease is also suspected. 2. Aortic atherosclerosis. Electronically Signed   By: Trudie Reed M.D.   On: 08/26/2022 05:51   CT Angio Chest/Abd/Pel for Dissection W and/or Wo Contrast  Result Date: 08/22/2022 CLINICAL DATA:  Vomiting and diarrhea. Concern for acute aortic syndrome. History of breast cancer. EXAM: CT ANGIOGRAPHY CHEST, ABDOMEN AND PELVIS TECHNIQUE: Non-contrast CT of the chest was initially obtained. Multidetector CT imaging through the chest, abdomen and pelvis was performed using the standard protocol during bolus administration of intravenous contrast. Multiplanar reconstructed images and MIPs were  obtained and reviewed to evaluate the vascular anatomy. RADIATION DOSE REDUCTION: This exam was performed according to the departmental dose-optimization program which includes automated exposure control, adjustment of the mA and/or kV according to patient size and/or use of iterative reconstruction technique. CONTRAST:  80mL OMNIPAQUE IOHEXOL 350 MG/ML SOLN COMPARISON:  CT chest dated June 08, 2022. CT abdomen pelvis dated March 18, 2022. FINDINGS: CTA CHEST FINDINGS Cardiovascular: Preferential opacification of the thoracic aorta. No evidence of thoracic  aortic aneurysm or dissection. No intramural hematoma. Coronary, aortic arch, and branch vessel atherosclerotic vascular disease. Normal heart size. No pericardial effusion. No pulmonary embolism. Mediastinum/Nodes: No enlarged mediastinal, hilar, or axillary lymph nodes. Thyroid gland, trachea, and esophagus demonstrate no significant findings. Lungs/Pleura: Bibasilar scarring. No focal consolidation, pleural effusion, or pneumothorax. Musculoskeletal: Status post bilateral mastectomies. No acute or significant osseous findings. Review of the MIP images confirms the above findings. CTA ABDOMEN AND PELVIS FINDINGS VASCULAR Aorta: Unchanged infrarenal abdominal aortic aneurysm measuring up to 3.6 cm in diameter. No dissection, vasculitis or significant stenosis. Calcified and noncalcified atherosclerotic plaque. 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 Appearance is suggestive of median arcuate ligament compression. 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 Renals: Single renal arteries bilaterally. Chronic severe stenosis of the distal left renal artery with severe left renal atrophy. Patent right proximal renal artery stent. IMA: Patent without evidence of aneurysm, dissection, vasculitis or significant stenosis. Inflow: Patent without evidence of aneurysm, dissection, vasculitis or significant stenosis. Veins: No obvious venous abnormality within the limitations of this arterial phase study. Review of the MIP images confirms the above findings. NON-VASCULAR Hepatobiliary: No focal liver abnormality is seen. No gallstones, gallbladder wall thickening, or biliary dilatation. Pancreas: No ductal dilatation or surrounding inflammatory changes. Multiple cystic pancreatic lesions measuring up to 2.6 cm, previously 2.5 cm. Spleen: Normal in size without focal abnormality. Adrenals/Urinary Tract: Adrenal glands are unremarkable. Chronic  severe left renal atrophy. No renal calculi or hydronephrosis. Bladder is unremarkable. Stomach/Bowel: Unchanged small hiatal hernia. No bowel wall thickening, distention, or surrounding inflammatory changes. History of prior appendectomy. Lymphatic: No enlarged abdominal or pelvic lymph nodes. Reproductive: Status post hysterectomy. No adnexal masses. Other: No free fluid or pneumoperitoneum. Musculoskeletal: No acute or significant osseous findings. Review of the MIP images confirms the above findings. IMPRESSION: 1. No evidence of acute aortic syndrome. 2. Unchanged infrarenal abdominal aortic aneurysm measuring up to 3.6 cm in diameter. Recommend follow-up ultrasound every 2 years. 3. 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 No evidence of bowel ischemia. 4. Multiple cystic pancreatic lesions measuring up to 2.6 cm. Given the patient's age, no routine follow-up is recommended. 5.  Aortic atherosclerosis (ICD10-I70.0). Electronically Signed   By: Obie Dredge M.D.   On: 08/22/2022 13:34   CT HEAD WO CONTRAST ( )  Result Date: 08/22/2022 CLINICAL DATA:  Acute onset of severe headache. EXAM: CT HEAD WITHOUT CONTRAST TECHNIQUE: Contiguous axial images were obtained from the base of the skull through the vertex without intravenous contrast. RADIATION DOSE REDUCTION: This exam was performed according to the departmental dose-optimization program which includes automated exposure control, adjustment of the mA and/or kV according to patient size and/or use of iterative reconstruction technique. COMPARISON:  06/05/2022 FINDINGS: Brain: No evidence of intracranial hemorrhage, acute infarction, hydrocephalus, extra-axial collection, or mass lesion/mass effect. Mild cerebral atrophy and chronic small vessel disease again noted. Vascular:  No hyperdense vessel or other acute  findings. Skull: No evidence of fracture or other significant bone abnormality. Sinuses/Orbits:  No acute findings. Other:  None. IMPRESSION: No acute intracranial abnormality. Stable mild cerebral atrophy and chronic small vessel disease. Electronically Signed   By: Danae Orleans M.D.   On: 08/22/2022 13:22   DG Bone Density  Result Date: 08/20/2022 EXAM: DUAL X-RAY ABSORPTIOMETRY (DXA) FOR BONE MINERAL DENSITY IMPRESSION: Your patient Kerrin Markman completed a BMD test on 08/20/2022 using the [employer] DXA System (software version: 14.10) manufactured by [employer]. The following summarizes the results of our evaluation. Technologist: [facility] PATIENT BIOGRAPHICAL: Name: [place], Kreitzer Patient ID: [id number] Birth Date: 06/07/1926 Height: 64.0 in. Gender: Female Exam Date: 08/20/2022 Weight: 135.1 lbs. Indications: History of Breast Cancer, Advanced Age, Hysterectomy, Caucasian, History of Radiation, Oophorectomy Bilateral, Postmenopausal Fractures: Treatments: Anastrozole, Calcium, Prolia, Vitamin D DENSITOMETRY RESULTS: Site         Region     Measured Date Measured Age WHO Classification Young Adult T-score BMD         %Change vs. Previous Significant Change (*) DualFemur Neck Right 08/20/2022 93.2 Osteoporosis -2.7 0.669 g/cm2 2.3% - DualFemur Neck Right 03/07/2020 90.8 Osteoporosis -2.8 0.654 g/cm2 - - DualFemur Total Mean 08/20/2022 93.2 Osteoporosis -2.6 0.685 g/cm2 1.3% - DualFemur Total Mean 03/07/2020 90.8 Osteoporosis -2.6 0.676 g/cm2 - - Left Forearm Radius 33% 08/20/2022 93.2 Osteoporosis -3.1 0.604 g/cm2 10.2% Yes Left Forearm Radius 33% 03/07/2020 90.8 Osteoporosis -3.7 0.548 g/cm2 - - ASSESSMENT: The BMD measured at Forearm Radius 33% is 0.604 g/cm2 with a T-score of -3.1. This patient is considered osteoporotic according to World Health Organization [facility]) criteria. The scan quality is good. Lumbar spine was not utilized due to advanced degenerative changes. Compared with prior study, there has been no significant change in the total hip. World Science writer [facility]) criteria for post-menopausal, Caucasian  Women: Normal:                   T-score at or above -1 SD Osteopenia/low bone mass: T-score between -1 and -2.5 SD Osteoporosis:             T-score at or below -2.5 SD RECOMMENDATIONS: 1. All patients should optimize calcium and vitamin D intake. 2. Consider FDA-approved medical therapies in postmenopausal women and men aged 48 years and older, based on the following: a. A hip or vertebral(clinical or morphometric) fracture b. T-score < -2.5 at the femoral neck or spine after appropriate evaluation to exclude secondary causes c. Low bone mass (T-score between -1.0 and -2.5 at the femoral neck or spine) and a 10-year probability of a hip fracture > 3% or a 10-year probability of a major osteoporosis-related fracture > 20% based on the [place]-adapted WHO algorithm 3. Clinician judgment and/or patient preferences may indicate treatment for people with 10-year fracture probabilities above or below these levels FOLLOW-UP: People with diagnosed cases of osteoporosis or at high risk for fracture should have regular bone mineral density tests. For patients eligible for Medicare, routine testing is allowed once every 2 years. The testing frequency can be increased to one year for patients who have rapidly progressing disease, those who are receiving or discontinuing medical therapy to restore bone mass, or have additional risk factors. I have reviewed this report, and agree with the above findings. [facility] Electronically Signed   By: Gerome Sam III M.D.   On: 08/20/2022 17:57     The results of significant diagnostics from this hospitalization (including imaging,  microbiology, ancillary and laboratory) are listed below for reference.     Microbiology: Recent Results (from the past 240 hour(s))  Culture, blood (Routine X 2) w Reflex to ID Panel     Status: Abnormal   Collection Time: 08/26/22  6:35 PM   Specimen: BLOOD  Result Value Ref Range Status   Specimen Description   Final    BLOOD  BLOOD RIGHT FOREARM Performed at [facility], [address] [place], [place] [address]    Special Requests   Final    BOTTLES DRAWN AEROBIC AND ANAEROBIC Blood Culture adequate volume Performed at [facility], [address], [place], [place] [address]    Culture  Setup Time   Final    GRAM POSITIVE COCCI IN BOTH AEROBIC AND ANAEROBIC BOTTLES CRITICAL RESULT CALLED TO, READ BACK BY AND VERIFIED WITH: NATHAN BLUE@[email or web address]  08/26/21 RH Performed at [facility], [address] [place], [place] [address]    Culture STREPTOCOCCUS GROUP G (A)  Final   Report Status 08/31/2022 FINAL  Final   Organism ID, Bacteria STREPTOCOCCUS GROUP G  Final      Susceptibility   Streptococcus group g - MIC*    CLINDAMYCIN RESISTANT Resistant     AMPICILLIN <=0.25 SENSITIVE Sensitive     ERYTHROMYCIN 4 RESISTANT Resistant     VANCOMYCIN 0.5 SENSITIVE Sensitive     CEFTRIAXONE <=0.12 SENSITIVE Sensitive     LEVOFLOXACIN 0.5 SENSITIVE Sensitive     PENICILLIN <=0.06 SENSITIVE Sensitive     * STREPTOCOCCUS GROUP G  Culture, blood (Routine X 2) w Reflex to ID Panel     Status: Abnormal   Collection Time: 08/26/22  6:35 PM   Specimen: BLOOD  Result Value Ref Range Status   Specimen Description BLOOD BLOOD LEFT HAND  Final   Special Requests   Final    BOTTLES DRAWN AEROBIC AND ANAEROBIC Blood Culture adequate volume   Culture  Setup Time   Final    GRAM POSITIVE COCCI IN BOTH AEROBIC AND ANAEROBIC BOTTLES CRITICAL VALUE NOTED.  VALUE IS CONSISTENT WITH PREVIOUSLY REPORTED AND CALLED VALUE.    Culture (A)  Final    STREPTOCOCCUS GROUP G SUSCEPTIBILITIES PERFORMED ON PREVIOUS CULTURE WITHIN THE LAST 5 DAYS.    Report Status 08/29/2022 FINAL  Final  Blood Culture ID Panel (Reflexed)     Status: Abnormal   Collection Time: 08/26/22  6:35 PM  Result Value Ref Range Status   Enterococcus faecalis NOT DETECTED NOT DETECTED Final   Enterococcus Faecium NOT DETECTED NOT DETECTED  Final   Listeria monocytogenes NOT DETECTED NOT DETECTED Final   Staphylococcus species NOT DETECTED NOT DETECTED Final   Staphylococcus aureus (BCID) NOT DETECTED NOT DETECTED Final   Staphylococcus epidermidis NOT DETECTED NOT DETECTED Final   Staphylococcus lugdunensis NOT DETECTED NOT DETECTED Final   Streptococcus species DETECTED (A) NOT DETECTED Final    Comment: Not Enterococcus species, Streptococcus agalactiae, Streptococcus pyogenes, or Streptococcus pneumoniae. CRITICAL RESULT CALLED TO, READ BACK BY AND VERIFIED WITH: NATHAN BLUE@0640  08/27/22 RH    Streptococcus agalactiae NOT DETECTED NOT DETECTED Final   Streptococcus pneumoniae NOT DETECTED NOT DETECTED Final   Streptococcus pyogenes NOT DETECTED NOT DETECTED Final   A.calcoaceticus-baumannii NOT DETECTED NOT DETECTED Final   Bacteroides fragilis NOT DETECTED NOT DETECTED Final   Enterobacterales NOT DETECTED NOT DETECTED Final   Enterobacter cloacae complex NOT DETECTED NOT DETECTED Final   Escherichia coli NOT DETECTED NOT DETECTED Final   Klebsiella  aerogenes NOT DETECTED NOT DETECTED Final   Klebsiella oxytoca NOT DETECTED NOT DETECTED Final   Klebsiella pneumoniae NOT DETECTED NOT DETECTED Final   Proteus species NOT DETECTED NOT DETECTED Final   Salmonella species NOT DETECTED NOT DETECTED Final   Serratia marcescens NOT DETECTED NOT DETECTED Final   Haemophilus influenzae NOT DETECTED NOT DETECTED Final   Neisseria meningitidis NOT DETECTED NOT DETECTED Final   Pseudomonas aeruginosa NOT DETECTED NOT DETECTED Final   Stenotrophomonas maltophilia NOT DETECTED NOT DETECTED Final   Candida albicans NOT DETECTED NOT DETECTED Final   Candida auris NOT DETECTED NOT DETECTED Final   Candida glabrata NOT DETECTED NOT DETECTED Final   Candida krusei NOT DETECTED NOT DETECTED Final   Candida parapsilosis NOT DETECTED NOT DETECTED Final   Candida tropicalis NOT DETECTED NOT DETECTED Final   Cryptococcus  neoformans/gattii NOT DETECTED NOT DETECTED Final    Comment: Performed at [facility], [address] [place], [place] [address]  C Difficile Quick Screen w PCR reflex     Status: None   Collection Time: 08/27/22  5:05 AM   Specimen: STOOL  Result Value Ref Range Status   C Diff antigen NEGATIVE NEGATIVE Final   C Diff toxin NEGATIVE NEGATIVE Final   C Diff interpretation No C. difficile detected.  Final    Comment: Performed at [facility], [address] [place], [place] [address]  Gastrointestinal Panel by PCR , Stool     Status: Abnormal   Collection Time: 08/27/22  5:05 AM   Specimen: STOOL  Result Value Ref Range Status   Campylobacter species NOT DETECTED NOT DETECTED Final   Plesimonas shigelloides NOT DETECTED NOT DETECTED Final   Salmonella species NOT DETECTED NOT DETECTED Final   Yersinia enterocolitica NOT DETECTED NOT DETECTED Final   Vibrio species NOT DETECTED NOT DETECTED Final   Vibrio cholerae NOT DETECTED NOT DETECTED Final   Enteroaggregative E coli (EAEC) NOT DETECTED NOT DETECTED Final   Enteropathogenic E coli (EPEC) NOT DETECTED NOT DETECTED Final   Enterotoxigenic E coli (ETEC) NOT DETECTED NOT DETECTED Final   Shiga like toxin producing E coli (STEC) NOT DETECTED NOT DETECTED Final   Shigella/Enteroinvasive E coli (EIEC) NOT DETECTED NOT DETECTED Final   Cryptosporidium NOT DETECTED NOT DETECTED Final   Cyclospora cayetanensis NOT DETECTED NOT DETECTED Final   Entamoeba histolytica NOT DETECTED NOT DETECTED Final   Giardia lamblia NOT DETECTED NOT DETECTED Final   Adenovirus F40/41 NOT DETECTED NOT DETECTED Final   Astrovirus NOT DETECTED NOT DETECTED Final   Norovirus GI/GII NOT DETECTED NOT DETECTED Final   Rotavirus A NOT DETECTED NOT DETECTED Final   Sapovirus (I, II, IV, and V) DETECTED (A) NOT DETECTED Final    Comment: Performed at [facility], [address] [place], [place] [address]  Culture, blood (Routine X  2) w Reflex to ID Panel     Status: None   Collection Time: 08/28/22  7:42 AM   Specimen: BLOOD  Result Value Ref Range Status   Specimen Description BLOOD BLOOD LEFT ARM  Final   Special Requests   Final    BOTTLES DRAWN AEROBIC ONLY Blood Culture adequate volume   Culture   Final    NO GROWTH 5 DAYS Performed at [facility], [address] [place], [place] [address]    Report Status 09/02/2022 FINAL  Final  Culture, blood (Routine X 2) w Reflex to ID Panel     Status: None  Collection Time: 08/28/22  7:42 AM   Specimen: BLOOD  Result Value Ref Range Status   Specimen Description BLOOD BLOOD RIGHT ARM  Final   Special Requests   Final    BOTTLES DRAWN AEROBIC AND ANAEROBIC Blood Culture results may not be optimal due to an excessive volume of blood received in culture bottles   Culture   Final    NO GROWTH 5 DAYS Performed at [facility], [address] [place], [place] [address]    Report Status 09/02/2022 FINAL  Final     Labs: BNP (last 3 results) Recent Labs    08/26/22 0526 08/27/22 0742  BNP 621.0* 1,712.6*   Basic Metabolic Panel: Recent Labs  Lab 08/27/22 0426 08/27/22 1257 08/28/22 0214 08/29/22 1006 08/30/22 0437 08/30/22 1751 08/31/22 0526 09/01/22 0529 09/02/22 0745  NA 137  --  132* 134* 134*  --  134* 133* 136  K 2.9*   < > 3.5 3.6 3.0* 3.4* 3.6 3.2* 3.7  CL 108  --  107 105 105  --  106 104 108  CO2 20*  --  18* 19* 19*  --  20* 21* 21*  GLUCOSE 90  --  120* 99 102*  --  111* 113* 110*  BUN 22  --  27* 30* 28*  --  CREATININE 1.45*  --  1.62* 1.81* 1.76* 1.78* 1.55* 1.55* 1.64*  CALCIUM 6.1*  --  6.4* 6.4* 6.8*  --  7.2* 7.0* 6.6*  MG 1.3*  --  2.3 1.8 2.0  --  1.9  --   --   PHOS  --   --  1.4* 2.6 1.7* 2.2* 1.5* 1.9*  --    < > = values in this interval not displayed.   Liver Function Tests: Recent Labs  Lab 08/28/22 0214 08/29/22 1006  ALBUMIN 2.7* 2.8*   No results for input(s): "LIPASE", "AMYLASE"  in the last 168 hours. No results for input(s): "AMMONIA" in the last 168 hours. CBC: Recent Labs  Lab 08/26/22 1835 08/27/22 0426 08/27/22 0742 08/28/22 0214 08/29/22 0523  WBC 10.4 12.0* 13.3* 14.1* 13.1*  HGB 10.6* 10.1* 10.3* 10.0* 10.1*  HCT 31.4* 29.9* 31.5* 29.0* 29.7*  MCV 92.4 92.6 92.6 90.1 90.3  PLT 166 154 154 165 194   Cardiac Enzymes: No results for input(s): "CKTOTAL", "CKMB", "CKMBINDEX", "TROPONINI" in the last 168 hours. BNP: Invalid input(s): "POCBNP" CBG: Recent Labs  Lab 09/01/22 0757 09/01/22 1854 09/01/22 2220 09/02/22 0904 09/02/22 1131  GLUCAP 105* 166* 131* 102* 136*   D-Dimer No results for input(s): "DDIMER" in the last 72 hours. Hgb A1c No results for input(s): "HGBA1C" in the last 72 hours. Lipid Profile No results for input(s): "CHOL", "HDL", "LDLCALC", "TRIG", "CHOLHDL", "LDLDIRECT" in the last 72 hours. Thyroid function studies No results for input(s): "TSH", "T4TOTAL", "T3FREE", "THYROIDAB" in the last 72 hours.  Invalid input(s): "FREET3" Anemia work up No results for input(s): "VITAMINB12", "FOLATE", "FERRITIN", "TIBC", "IRON", "RETICCTPCT" in the last 72 hours. Urinalysis    Component Value Date/Time   COLORURINE STRAW (A) 08/22/2022 1318   APPEARANCEUR CLEAR (A) 08/22/2022 1318   LABSPEC 1.028 08/22/2022 1318   PHURINE 7.0 08/22/2022 1318   GLUCOSEU >=500 (A) 08/22/2022 1318   HGBUR NEGATIVE 08/22/2022 1318   BILIRUBINUR NEGATIVE 08/22/2022 1318   BILIRUBINUR negative 03/18/2022 1614   KETONESUR NEGATIVE 08/22/2022 1318   PROTEINUR 100 (A) 08/22/2022 1318   UROBILINOGEN 0.2 03/18/2022 1614   NITRITE  NEGATIVE 08/22/2022 1318   LEUKOCYTESUR NEGATIVE 08/22/2022 1318   Sepsis Labs Recent Labs  Lab 08/27/22 0426 08/27/22 0742 08/28/22 0214 08/29/22 0523  WBC 12.0* 13.3* 14.1* 13.1*   Microbiology Recent Results (from the past 240 hour(s))  Culture, blood (Routine X 2) w Reflex to ID Panel     Status: Abnormal    Collection Time: 08/26/22  6:35 PM   Specimen: BLOOD  Result Value Ref Range Status   Specimen Description   Final    BLOOD BLOOD RIGHT FOREARM Performed at [facility], [address] [place], [place] [address]    Special Requests   Final    BOTTLES DRAWN AEROBIC AND ANAEROBIC Blood Culture adequate volume Performed at [facility], [address] [place], [place] [address]    Culture  Setup Time   Final    GRAM POSITIVE COCCI IN BOTH AEROBIC AND ANAEROBIC BOTTLES CRITICAL RESULT CALLED TO, READ BACK BY AND VERIFIED WITH: NATHAN BLUE@0640  08/26/21 RH Performed at [facility], [address], [place], [place] [address]    Culture STREPTOCOCCUS GROUP G (A)  Final   Report Status 08/31/2022 FINAL  Final   Organism ID, Bacteria STREPTOCOCCUS GROUP G  Final      Susceptibility   Streptococcus group g - MIC*    CLINDAMYCIN RESISTANT Resistant     AMPICILLIN <=0.25 SENSITIVE Sensitive     ERYTHROMYCIN 4 RESISTANT Resistant     VANCOMYCIN 0.5 SENSITIVE Sensitive     CEFTRIAXONE <=0.12 SENSITIVE Sensitive     LEVOFLOXACIN 0.5 SENSITIVE Sensitive     PENICILLIN <=0.06 SENSITIVE Sensitive     * STREPTOCOCCUS GROUP G  Culture, blood (Routine X 2) w Reflex to ID Panel     Status: Abnormal   Collection Time: 08/26/22  6:35 PM   Specimen: BLOOD  Result Value Ref Range Status   Specimen Description BLOOD BLOOD LEFT HAND  Final   Special Requests   Final    BOTTLES DRAWN AEROBIC AND ANAEROBIC Blood Culture adequate volume   Culture  Setup Time   Final    GRAM POSITIVE COCCI IN BOTH AEROBIC AND ANAEROBIC BOTTLES CRITICAL VALUE NOTED.  VALUE IS CONSISTENT WITH PREVIOUSLY REPORTED AND CALLED VALUE.    Culture (A)  Final    STREPTOCOCCUS GROUP G SUSCEPTIBILITIES PERFORMED ON PREVIOUS CULTURE WITHIN THE LAST 5 DAYS.    Report Status 08/29/2022 FINAL  Final  Blood Culture ID Panel (Reflexed)     Status: Abnormal   Collection Time: 08/26/22  6:35 PM  Result  Value Ref Range Status   Enterococcus faecalis NOT DETECTED NOT DETECTED Final   Enterococcus Faecium NOT DETECTED NOT DETECTED Final   Listeria monocytogenes NOT DETECTED NOT DETECTED Final   Staphylococcus species NOT DETECTED NOT DETECTED Final   Staphylococcus aureus (BCID) NOT DETECTED NOT DETECTED Final   Staphylococcus epidermidis NOT DETECTED NOT DETECTED Final   Staphylococcus lugdunensis NOT DETECTED NOT DETECTED Final   Streptococcus species DETECTED (A) NOT DETECTED Final    Comment: Not Enterococcus species, Streptococcus agalactiae, Streptococcus pyogenes, or Streptococcus pneumoniae. CRITICAL RESULT CALLED TO, READ BACK BY AND VERIFIED WITH: NATHAN BLUE@0640  08/27/22 RH    Streptococcus agalactiae NOT DETECTED NOT DETECTED Final   Streptococcus pneumoniae NOT DETECTED NOT DETECTED Final   Streptococcus pyogenes NOT DETECTED NOT DETECTED Final   A.calcoaceticus-baumannii NOT DETECTED NOT DETECTED Final   Bacteroides fragilis NOT DETECTED NOT DETECTED Final   Enterobacterales NOT DETECTED NOT DETECTED Final  Enterobacter cloacae complex NOT DETECTED NOT DETECTED Final   Escherichia coli NOT DETECTED NOT DETECTED Final   Klebsiella aerogenes NOT DETECTED NOT DETECTED Final   Klebsiella oxytoca NOT DETECTED NOT DETECTED Final   Klebsiella pneumoniae NOT DETECTED NOT DETECTED Final   Proteus species NOT DETECTED NOT DETECTED Final   Salmonella species NOT DETECTED NOT DETECTED Final   Serratia marcescens NOT DETECTED NOT DETECTED Final   Haemophilus influenzae NOT DETECTED NOT DETECTED Final   Neisseria meningitidis NOT DETECTED NOT DETECTED Final   Pseudomonas aeruginosa NOT DETECTED NOT DETECTED Final   Stenotrophomonas maltophilia NOT DETECTED NOT DETECTED Final   Candida albicans NOT DETECTED NOT DETECTED Final   Candida auris NOT DETECTED NOT DETECTED Final   Candida glabrata NOT DETECTED NOT DETECTED Final   Candida krusei NOT DETECTED NOT DETECTED Final   Candida  parapsilosis NOT DETECTED NOT DETECTED Final   Candida tropicalis NOT DETECTED NOT DETECTED Final   Cryptococcus neoformans/gattii NOT DETECTED NOT DETECTED Final    Comment: Performed at [facility], [address] [place], [place] [address]  C Difficile Quick Screen w PCR reflex     Status: None   Collection Time: 08/27/22  5:05 AM   Specimen: STOOL  Result Value Ref Range Status   C Diff antigen NEGATIVE NEGATIVE Final   C Diff toxin NEGATIVE NEGATIVE Final   C Diff interpretation No C. difficile detected.  Final    Comment: Performed at [facility], [address] [place], [place] [address]  Gastrointestinal Panel by PCR , Stool     Status: Abnormal   Collection Time: 08/27/22  5:05 AM   Specimen: STOOL  Result Value Ref Range Status   Campylobacter species NOT DETECTED NOT DETECTED Final   Plesimonas shigelloides NOT DETECTED NOT DETECTED Final   Salmonella species NOT DETECTED NOT DETECTED Final   Yersinia enterocolitica NOT DETECTED NOT DETECTED Final   Vibrio species NOT DETECTED NOT DETECTED Final   Vibrio cholerae NOT DETECTED NOT DETECTED Final   Enteroaggregative E coli (EAEC) NOT DETECTED NOT DETECTED Final   Enteropathogenic E coli (EPEC) NOT DETECTED NOT DETECTED Final   Enterotoxigenic E coli (ETEC) NOT DETECTED NOT DETECTED Final   Shiga like toxin producing E coli (STEC) NOT DETECTED NOT DETECTED Final   Shigella/Enteroinvasive E coli (EIEC) NOT DETECTED NOT DETECTED Final   Cryptosporidium NOT DETECTED NOT DETECTED Final   Cyclospora cayetanensis NOT DETECTED NOT DETECTED Final   Entamoeba histolytica NOT DETECTED NOT DETECTED Final   Giardia lamblia NOT DETECTED NOT DETECTED Final   Adenovirus F40/41 NOT DETECTED NOT DETECTED Final   Astrovirus NOT DETECTED NOT DETECTED Final   Norovirus GI/GII NOT DETECTED NOT DETECTED Final   Rotavirus A NOT DETECTED NOT DETECTED Final   Sapovirus (I, II, IV, and V) DETECTED (A) NOT DETECTED Final     Comment: Performed at [facility], [address] [place], [place] [address]  Culture, blood (Routine X 2) w Reflex to ID Panel     Status: None   Collection Time: 08/28/22  7:42 AM   Specimen: BLOOD  Result Value Ref Range Status   Specimen Description BLOOD BLOOD LEFT ARM  Final   Special Requests   Final    BOTTLES DRAWN AEROBIC ONLY Blood Culture adequate volume   Culture   Final    NO GROWTH 5 DAYS Performed at [facility], [address] [place], [place] [address]    Report Status 09/02/2022 FINAL  Final  Culture, blood (Routine X 2) w Reflex to ID Panel     Status: None   Collection Time: 08/28/22  7:42 AM   Specimen: BLOOD  Result Value Ref Range Status   Specimen Description BLOOD BLOOD RIGHT ARM  Final   Special Requests   Final    BOTTLES DRAWN AEROBIC AND ANAEROBIC Blood Culture results may not be optimal due to an excessive volume of blood received in culture bottles   Culture   Final    NO GROWTH 5 DAYS Performed at [facility], [address], [place], [place] [address]    Report Status 09/02/2022 FINAL  Final     Time coordinating discharge:  I have spent 35 minutes face to face with the patient and on the ward discussing the patients care, assessment, plan and disposition with other care givers. >50% of the time was devoted counseling the patient about the risks and benefits of treatment/Discharge disposition and coordinating care.   SIGNED:   Dimple Nanas, MD  [facility] 09/02/2022, 2:14 PM   If 7PM-7AM, please contact night-coverage

## 2022-09-02 NOTE — Progress Notes (Signed)
PROGRESS NOTE    Tina Curtis  [name]:[id number] [name]: [birth year] [name]: 08/26/2022 PCP: [name], MD   Brief Narrative:  89 year old with history of HTN, HLD, diastolic CHF, gout, depression, CKD stage IIIb, neuropathy, C. difficile, breast cancer comes to the hospital with hematemesis, nausea, vomiting, diarrhea and chest pain.  CTA chest abdomen pelvis showed gastroenteritis.  Also she was diagnosed with acute bronchopneumonia and streptococcal bacteremia.  Lower extremity Dopplers was negative, NM perfusion study was indeterminate.  Infectious disease was consulted, repeat cultures remain negative.  Eventually was decided to treat total 2 weeks of IV Rocephin, last day 09/09/2022. Hospital course also complicated by NSTEMI and patient was seen by cardiology.  She was placed on 48 hours of IV heparin, echocardiogram showed EF of 60 to 70% with moderate to severe MR/TR.  Medications were adjusted.  There was also some evidence of fluid overload requiring IV Lasix.  GI stool study showed Sapovirus and was treated symptomatically.  She was also seen by GI team who recommended PPI PT/OT recommended SNF, arrangements were made.   Assessment & Plan:  Principal Problem:   Hematemesis Active Problems:   Acute blood loss anemia   Essential hypertension   Hypertensive urgency   Chronic diastolic CHF (congestive heart failure)   NSTEMI (non-ST elevated myocardial infarction)   Chest pain   Hypokalemia   Hypocalcemia   Hypomagnesemia   Hypophosphatemia   Type II diabetes mellitus with renal manifestations   Anemia of chronic renal failure, stage 3b   Gout   Depression   Rash   Pneumonia due to infectious organism   Sepsis due to Streptococcus species with acute hypoxic respiratory failure without septic shock   CHF NYHA class III, acute, diastolic   Bacteremia due to Streptococcus      Sepsis due to Acute bronchopneumonia Streptococcal G Bacterimia Patient seen by infectious disease,  repeat cultures remain negative at this time.  Last set of cultures are negative or from 4/19.  For now patient will be on IV Rocephin 2 g every 24 hours, last day 5/1.  This will complete total 2-week course. Weekly CBC with differential/CMP.  Plan is to check 2 sets of cultures on 5/15, if negative PICC line can be pulled out.  Outpatient follow-up with infectious disease we will arrange by their service     NSTEMI No prior cardiac history, troponins peaked at 3299.  Remains chest pain-free.  Echocardiogram shows EF of 60 to 70%, moderate to severe MR/TR.  Seen by cardiology, completed 48 hours of IV heparin.  Medication management  Acute diastolic CHF Valvular regurgitation Received IV Lasix, now euvolemic.   Viral GE --GI PCR stool positive for Sapovirus Symptoms have resolved   Hypokalemia/hypomagnesimia/low phosphorus, hypocalcemia As needed repletion   Vomiting/hematemesis Seen by GI team, recommended PPI   Type II diabetes mellitus with renal manifestations CKD IIIB:  Recent A1c 9.0.  Poorly controlled.  Resume home regimen   Anemia of chronic renal failure, stage 3b:  Hgb 11.9 (12.4 on 08/22/22   Lower back pain acute on chronic MRI showing evidence of reactive changes but appears to be chronic and arthritic in nature versus infection.  This should closely be monitored in outpatient setting especially she is at a risk of lumbar osteomyelitis in the setting of bacteremia   Depression:  -Continue home medications   Rash:  -Etiology is unclear but has nearly resolved with hydrocortisone.  Recommend outpatient follow-up with dermatology   PT/OT to see pt. Given  the severity of illness and significant deconditioning patient will benefit from rehab. TOC for discharge planning   DVT prophylaxis: enoxaparin (LOVENOX) injection 30 mg Start: 08/29/22 1700 SCDs Start: 08/26/22 0757 Code Status: DNR Family Communication: Family at bedside Status is: Inpatient Pending  placement.        Diet Orders (From admission, onward)     Start     Ordered   08/26/22 1613  Diet Carb Modified Fluid consistency: Thin; Room service appropriate? Yes  Diet effective now       Question Answer Comment  Diet-HS Snack? Nothing   Calorie Level Medium 1600-2000   Fluid consistency: Thin   Room service appropriate? Yes      08/26/22 1612            Subjective: Seen at bedside, no complaints   Examination:  General exam: Appears calm and comfortable  Respiratory system: Clear to auscultation. Respiratory effort normal. Cardiovascular system: S1 & S2 heard, RRR. No JVD, murmurs, rubs, gallops or clicks. No pedal edema. Gastrointestinal system: Abdomen is nondistended, soft and nontender. No organomegaly or masses felt. Normal bowel sounds heard. Central nervous system: Alert and oriented. No focal neurological deficits. Extremities: Symmetric 5 x 5 power. Skin: No rashes, lesions or ulcers Psychiatry: Judgement and insight appear normal. Mood & affect appropriate. Left upper extremity midline Objective: Vitals:   09/01/22 1638 09/02/22 0010 09/02/22 0418 09/02/22 0500  BP: 132/72 (!) 116/57 (!) 119/55   Pulse: 87 77 70   Resp: Temp: 98.7 F (37.1 C) 98.5 F (36.9 C) 98.4 F (36.9 C)   TempSrc: Oral Oral Oral   SpO2: 96% 95% 98%   Weight:    63.7 kg  Height:        Intake/Output Summary (Last 24 hours) at 09/02/2022 0824 Last data filed at 09/02/2022 0700 Gross per 24 hour  Intake 675.44 ml  Output 950 ml  Net -274.56 ml   Filed Weights   08/29/22 0500 09/01/22 0853 09/02/22 0500  Weight: 67.9 kg 63.7 kg 63.7 kg    Scheduled Meds:  acidophilus  1 capsule Oral Daily   allopurinol  100 mg Oral Daily   anastrozole  1 mg Oral Daily   aspirin  81 mg Oral Daily   atorvastatin  40 mg Oral Daily   calcium carbonate  1,000 mg of elemental calcium Oral BID WC   cholecalciferol  2,000 Units Oral Daily   citalopram  10 mg Oral Daily    clopidogrel  75 mg Oral Daily   cyanocobalamin  1,000 mcg Oral Daily   DULoxetine  60 mg Oral Daily   empagliflozin  10 mg Oral Daily   enoxaparin (LOVENOX) injection  30 mg Subcutaneous Q24H   furosemide  40 mg Oral Daily   gabapentin  300 mg Oral QHS   hydrALAZINE  25 mg Oral TID   hydrocortisone cream   Topical BID   insulin aspart  0-5 Units Subcutaneous QHS   insulin aspart  0-9 Units Subcutaneous TID WC   isosorbide mononitrate  30 mg Oral Daily   metoprolol succinate  37.5 mg Oral QHS   multivitamin  1 tablet Oral Daily   pantoprazole  40 mg Oral Daily   phosphorus  250 mg Oral BID   potassium chloride  20 mEq Oral Daily   sodium chloride flush  10-40 mL Intracatheter Q12H   traZODone  25 mg Oral QHS   Continuous Infusions:  cefTRIAXone (ROCEPHIN)  IV  2 g (09/01/22 0981)    Nutritional status     Body mass index is 24.11 kg/m.  Data Reviewed:   CBC: Recent Labs  Lab 08/26/22 1835 08/27/22 0426 08/27/22 0742 08/28/22 0214 08/29/22 0523  WBC 10.4 12.0* 13.3* 14.1* 13.1*  HGB 10.6* 10.1* 10.3* 10.0* 10.1*  HCT 31.4* 29.9* 31.5* 29.0* 29.7*  MCV 92.4 92.6 92.6 90.1 90.3  PLT 166 154 154 165 194   Basic Metabolic Panel: Recent Labs  Lab 08/27/22 0426 08/27/22 1257 08/28/22 0214 08/29/22 1006 08/30/22 0437 08/30/22 1751 08/31/22 0526 09/01/22 0529  NA 137  --  132* 134* 134*  --  134* 133*  K 2.9*   < > 3.5 3.6 3.0* 3.4* 3.6 3.2*  CL 108  --  107 105 105  --  106 104  CO2 20*  --  18* 19* 19*  --  20* 21*  GLUCOSE 90  --  120* 99 102*  --  111* 113*  BUN 22  --  27* 30* 28*  --  21 18  CREATININE 1.45*  --  1.62* 1.81* 1.76* 1.78* 1.55* 1.55*  CALCIUM 6.1*  --  6.4* 6.4* 6.8*  --  7.2* 7.0*  MG 1.3*  --  2.3 1.8 2.0  --  1.9  --   PHOS  --   --  1.4* 2.6 1.7* 2.2* 1.5* 1.9*   < > = values in this interval not displayed.   GFR: Estimated Creatinine Clearance: 19.6 mL/min (A) (by C-G formula based on SCr of 1.55 mg/dL (H)). Liver Function  Tests: Recent Labs  Lab 08/28/22 0214 08/29/22 1006  ALBUMIN 2.7* 2.8*   No results for input(s): "LIPASE", "AMYLASE" in the last 168 hours. No results for input(s): "AMMONIA" in the last 168 hours. Coagulation Profile: No results for input(s): "INR", "PROTIME" in the last 168 hours. Cardiac Enzymes: No results for input(s): "CKTOTAL", "CKMB", "CKMBINDEX", "TROPONINI" in the last 168 hours. BNP (last 3 results) No results for input(s): "PROBNP" in the last 8760 hours. HbA1C: No results for input(s): "HGBA1C" in the last 72 hours. CBG: Recent Labs  Lab 08/31/22 1631 08/31/22 2133 09/01/22 0757 09/01/22 1854 09/01/22 2220  GLUCAP 118* 126* 105* 166* 131*   Lipid Profile: No results for input(s): "CHOL", "HDL", "LDLCALC", "TRIG", "CHOLHDL", "LDLDIRECT" in the last 72 hours. Thyroid Function Tests: No results for input(s): "TSH", "T4TOTAL", "FREET4", "T3FREE", "THYROIDAB" in the last 72 hours. Anemia Panel: No results for input(s): "VITAMINB12", "FOLATE", "FERRITIN", "TIBC", "IRON", "RETICCTPCT" in the last 72 hours. Sepsis Labs: Recent Labs  Lab 08/26/22 1835 08/26/22 2122  PROCALCITON 40.14  --   LATICACIDVEN 1.8 1.5    Recent Results (from the past 240 hour(s))  Culture, blood (Routine X 2) w Reflex to ID Panel     Status: Abnormal   Collection Time: 08/26/22  6:35 PM   Specimen: BLOOD  Result Value Ref Range Status   Specimen Description   Final    BLOOD BLOOD RIGHT FOREARM Performed at [facility], [address], [place], [place] [address]    Special Requests   Final    BOTTLES DRAWN AEROBIC AND ANAEROBIC Blood Culture adequate volume Performed at [facility], [address], [place], [place] [address]    Culture  Setup Time   Final    GRAM POSITIVE COCCI IN BOTH AEROBIC AND ANAEROBIC BOTTLES CRITICAL RESULT CALLED TO, READ BACK BY AND VERIFIED WITH: NATHAN BLUE@0640  08/26/21 RH Performed at [facility],  [address]  [place], [place] [address]    Culture STREPTOCOCCUS GROUP G (A)  Final   Report Status 08/31/2022 FINAL  Final   Organism ID, Bacteria STREPTOCOCCUS GROUP G  Final      Susceptibility   Streptococcus group g - MIC*    CLINDAMYCIN RESISTANT Resistant     AMPICILLIN <=0.25 SENSITIVE Sensitive     ERYTHROMYCIN 4 RESISTANT Resistant     VANCOMYCIN 0.5 SENSITIVE Sensitive     CEFTRIAXONE <=0.12 SENSITIVE Sensitive     LEVOFLOXACIN 0.5 SENSITIVE Sensitive     PENICILLIN <=0.06 SENSITIVE Sensitive     * STREPTOCOCCUS GROUP G  Culture, blood (Routine X 2) w Reflex to ID Panel     Status: Abnormal   Collection Time: 08/26/22  6:35 PM   Specimen: BLOOD  Result Value Ref Range Status   Specimen Description BLOOD BLOOD LEFT HAND  Final   Special Requests   Final    BOTTLES DRAWN AEROBIC AND ANAEROBIC Blood Culture adequate volume   Culture  Setup Time   Final    GRAM POSITIVE COCCI IN BOTH AEROBIC AND ANAEROBIC BOTTLES CRITICAL VALUE NOTED.  VALUE IS CONSISTENT WITH PREVIOUSLY REPORTED AND CALLED VALUE.    Culture (A)  Final    STREPTOCOCCUS GROUP G SUSCEPTIBILITIES PERFORMED ON PREVIOUS CULTURE WITHIN THE LAST 5 DAYS.    Report Status 08/29/2022 FINAL  Final  Blood Culture ID Panel (Reflexed)     Status: Abnormal   Collection Time: 08/26/22  6:35 PM  Result Value Ref Range Status   Enterococcus faecalis NOT DETECTED NOT DETECTED Final   Enterococcus Faecium NOT DETECTED NOT DETECTED Final   Listeria monocytogenes NOT DETECTED NOT DETECTED Final   Staphylococcus species NOT DETECTED NOT DETECTED Final   Staphylococcus aureus (BCID) NOT DETECTED NOT DETECTED Final   Staphylococcus epidermidis NOT DETECTED NOT DETECTED Final   Staphylococcus lugdunensis NOT DETECTED NOT DETECTED Final   Streptococcus species DETECTED (A) NOT DETECTED Final    Comment: Not Enterococcus species, Streptococcus agalactiae, Streptococcus pyogenes, or Streptococcus pneumoniae. CRITICAL RESULT CALLED TO, READ BACK  BY AND VERIFIED WITH: NATHAN BLUE@0640  08/27/22 RH    Streptococcus agalactiae NOT DETECTED NOT DETECTED Final   Streptococcus pneumoniae NOT DETECTED NOT DETECTED Final   Streptococcus pyogenes NOT DETECTED NOT DETECTED Final   A.calcoaceticus-baumannii NOT DETECTED NOT DETECTED Final   Bacteroides fragilis NOT DETECTED NOT DETECTED Final   Enterobacterales NOT DETECTED NOT DETECTED Final   Enterobacter cloacae complex NOT DETECTED NOT DETECTED Final   Escherichia coli NOT DETECTED NOT DETECTED Final   Klebsiella aerogenes NOT DETECTED NOT DETECTED Final   Klebsiella oxytoca NOT DETECTED NOT DETECTED Final   Klebsiella pneumoniae NOT DETECTED NOT DETECTED Final   Proteus species NOT DETECTED NOT DETECTED Final   Salmonella species NOT DETECTED NOT DETECTED Final   Serratia marcescens NOT DETECTED NOT DETECTED Final   Haemophilus influenzae NOT DETECTED NOT DETECTED Final   Neisseria meningitidis NOT DETECTED NOT DETECTED Final   Pseudomonas aeruginosa NOT DETECTED NOT DETECTED Final   Stenotrophomonas maltophilia NOT DETECTED NOT DETECTED Final   Candida albicans NOT DETECTED NOT DETECTED Final   Candida auris NOT DETECTED NOT DETECTED Final   Candida glabrata NOT DETECTED NOT DETECTED Final   Candida krusei NOT DETECTED NOT DETECTED Final   Candida parapsilosis NOT DETECTED NOT DETECTED Final   Candida tropicalis NOT DETECTED NOT DETECTED Final   Cryptococcus neoformans/gattii NOT DETECTED NOT DETECTED Final    Comment: Performed at  [facility], [address], [place], [place] [address]  C Difficile Quick Screen w PCR reflex     Status: None   Collection Time: 08/27/22  5:05 AM   Specimen: STOOL  Result Value Ref Range Status   C Diff antigen NEGATIVE NEGATIVE Final   C Diff toxin NEGATIVE NEGATIVE Final   C Diff interpretation No C. difficile detected.  Final    Comment: Performed at [facility], [address] [place], [place] [address]   Gastrointestinal Panel by PCR , Stool     Status: Abnormal   Collection Time: 08/27/22  5:05 AM   Specimen: STOOL  Result Value Ref Range Status   Campylobacter species NOT DETECTED NOT DETECTED Final   Plesimonas shigelloides NOT DETECTED NOT DETECTED Final   Salmonella species NOT DETECTED NOT DETECTED Final   Yersinia enterocolitica NOT DETECTED NOT DETECTED Final   Vibrio species NOT DETECTED NOT DETECTED Final   Vibrio cholerae NOT DETECTED NOT DETECTED Final   Enteroaggregative E coli (EAEC) NOT DETECTED NOT DETECTED Final   Enteropathogenic E coli (EPEC) NOT DETECTED NOT DETECTED Final   Enterotoxigenic E coli (ETEC) NOT DETECTED NOT DETECTED Final   Shiga like toxin producing E coli (STEC) NOT DETECTED NOT DETECTED Final   Shigella/Enteroinvasive E coli (EIEC) NOT DETECTED NOT DETECTED Final   Cryptosporidium NOT DETECTED NOT DETECTED Final   Cyclospora cayetanensis NOT DETECTED NOT DETECTED Final   Entamoeba histolytica NOT DETECTED NOT DETECTED Final   Giardia lamblia NOT DETECTED NOT DETECTED Final   Adenovirus F40/41 NOT DETECTED NOT DETECTED Final   Astrovirus NOT DETECTED NOT DETECTED Final   Norovirus GI/GII NOT DETECTED NOT DETECTED Final   Rotavirus A NOT DETECTED NOT DETECTED Final   Sapovirus (I, II, IV, and V) DETECTED (A) NOT DETECTED Final    Comment: Performed at [facility], [address] [place], [place] [address]  Culture, blood (Routine X 2) w Reflex to ID Panel     Status: None (Preliminary result)   Collection Time: 08/28/22  7:42 AM   Specimen: BLOOD  Result Value Ref Range Status   Specimen Description BLOOD BLOOD LEFT ARM  Final   Special Requests   Final    BOTTLES DRAWN AEROBIC ONLY Blood Culture adequate volume   Culture   Final    NO GROWTH 4 DAYS Performed at [facility], [address] [place], [place] [address]    Report Status PENDING  Incomplete  Culture, blood (Routine X 2) w Reflex to ID Panel     Status: None  (Preliminary result)   Collection Time: 08/28/22  7:42 AM   Specimen: BLOOD  Result Value Ref Range Status   Specimen Description BLOOD BLOOD RIGHT ARM  Final   Special Requests   Final    BOTTLES DRAWN AEROBIC AND ANAEROBIC Blood Culture results may not be optimal due to an excessive volume of blood received in culture bottles   Culture   Final    NO GROWTH 4 DAYS Performed at [facility], [address] [place], [place] [address]    Report Status PENDING  Incomplete         Radiology Studies: MR Lumbar Spine W Wo Contrast  Result Date: 09/01/2022 CLINICAL DATA:  Initial evaluation for back pain. EXAM: MRI LUMBAR SPINE WITHOUT AND WITH CONTRAST TECHNIQUE: Multiplanar and multiecho pulse sequences of the lumbar spine were obtained without and with intravenous contrast. CONTRAST:  6mL GADAVIST GADOBUTROL 1 MMOL/ML  IV SOLN COMPARISON:  Prior study from 08/22/2022. FINDINGS: Segmentation: Standard. Lowest well-formed disc space labeled the L5-S1 level. Alignment: Sigmoid scoliotic curvature of the lumbar spine. 3 mm anterolisthesis of L4 on L5, with 5 mm anterolisthesis of L5 on S1. Findings chronic and facet mediated. Vertebrae: Vertebral body height maintained without acute or chronic fracture. Bone marrow signal intensity within normal limits. No discrete or worrisome osseous lesions. Reactive marrow edema and enhancement present about the left L4-5 facet, most likely due to degenerative facet arthritis. No other abnormal marrow edema or enhancement. Conus medullaris and cauda equina: Conus extends to the L2 level. Conus and cauda equina appear normal. Paraspinal and other soft tissues: Prominent edema and enhancement seen within the left lower posterior paraspinous soft tissues adjacent to the left L4-5 facet (series 10, image 12). Finding is most likely due to adjacent facet arthritis. No drainable fluid collections. Paraspinous soft tissues demonstrate no other acute finding.  Aortic atherosclerosis with 3.6 cm infrarenal aortic aneurysm. Few scattered subcentimeter T2 hyperintense cyst noted about the visualized right kidney, likely benign, with no follow-up imaging recommended. Asymmetric left renal atrophy noted. 1 cm benign appearing cyst noted within the spleen. Disc levels: T12-L1: Disc desiccation with mild disc bulge and endplate spurring. Mild facet hypertrophy. No stenosis. L1-2: Negative interspace. Mild facet hypertrophy. No canal or foraminal stenosis. L2-3: Degenerative intervertebral disc space narrowing with diffuse disc bulge, slightly asymmetric to the right. Mild bilateral facet hypertrophy. No spinal stenosis. Foramina remain adequately patent. L3-4: Degenerative intervertebral disc space narrowing with diffuse disc bulge and disc desiccation. Associated reactive endplate change with marginal endplate osteophytic spurring. Moderate left with mild right facet arthrosis. Resultant mild canal with left lateral recess stenosis. Mild left L3 foraminal narrowing. Right neural foramina remains patent. L4-5: Disc desiccation with diffuse disc bulge. Superimposed small central disc protrusion with slight inferior migration. Moderate left greater than right facet arthrosis with associated small joint effusions. Reactive marrow edema and enhancement on the left. Resultant mild canal with bilateral subarticular stenosis. Mild bilateral L4 foraminal narrowing. L5-S1: Anterolisthesis. Diffuse disc bulge with disc desiccation. Severe right with moderate left facet arthrosis. Resultant mild narrowing of the right lateral recess. Central canal remains patent. Mild to moderate right L5 foraminal stenosis. Left neural foramina remains patent. IMPRESSION: 1. Reactive edema and enhancement about the left L4-5 facet, most likely due to degenerative facet arthritis. Although possible infection with septic arthritis is felt to be unlikely, correlation with history and laboratory values is  recommended. 2. No other acute abnormality within the lumbar spine. 3. Multifactorial degenerative changes at L3-4 through L5-S1 with resultant mild canal with bilateral lateral recess stenosis as above. 4. 3.6 cm infrarenal aortic aneurysm. Recommend follow-up ultrasound every 2 years. This recommendation follows ACR consensus guidelines: White Paper of the ACR Incidental Findings Committee II on Vascular Findings. J Am Coll Radiol 2013; 10:789-794. Aortic Atherosclerosis (ICD10-I70.0). Electronically Signed   By: Rise Mu M.D.   On: 09/01/2022 05:23           LOS: 7 days   Time spent= 35 mins    Verlin Uher Joline Maxcy, MD [facility]  If 7PM-7AM, please contact night-coverage  09/02/2022, 8:24 AM

## 2022-09-02 NOTE — Progress Notes (Signed)
Occupational Therapy Treatment Patient Details Name: Tina Curtis MRN: [id number] [name]: 1926/05/29 Today's Date: 09/02/2022   History of present illness presented to ER secondary to hematemesis, nausea/vomiting, diarrhea and chest pain; admitted for management of streptococcus bacteremia, possible PNA and NSTEMI (peak troponin 3677, managed medically)   OT comments  Upon entering the room, pt bathing with NT and needing to have urgent BM. Placement of BSC and pt utilizing RW for min step pivot transfer. Pt have multiple bouts of diarrhea while on BSC. She is unable to perform hygiene while sitting and stands with min A and needs assistance for thoroughness. Pt needing assistance to thread mesh brief and socks onto B feet. Pt stands with min guard for balance and is able to pull mesh underwear over B hips. Pt takes several steps with RW to return to recliner chair. Call bell and all needed items within reach. Niece present in room at end of session.    Recommendations for follow up therapy are one component of a multi-disciplinary discharge planning process, led by the attending physician.  Recommendations may be updated based on patient status, additional functional criteria and insurance authorization.    Assistance Recommended at Discharge Frequent or constant Supervision/Assistance  Patient can return home with the following  A lot of help with walking and/or transfers;A lot of help with bathing/dressing/bathroom;Assistance with cooking/housework;Direct supervision/assist for medications management;Direct supervision/assist for financial management;Assist for transportation;Help with stairs or ramp for entrance   Equipment Recommendations  Other (comment) (defer to next venue of care)       Precautions / Restrictions Precautions Precautions: Fall Restrictions Weight Bearing Restrictions: No       Mobility Bed Mobility               General bed mobility comments: Pt in chair  pre/post session    Transfers Overall transfer level: Needs assistance Equipment used: Rolling walker (2 wheels) Transfers: Sit to/from Stand, Bed to chair/wheelchair/BSC Sit to Stand: Min assist     Step pivot transfers: Min assist           Balance Overall balance assessment: Needs assistance Sitting-balance support: No upper extremity supported, Feet supported Sitting balance-Leahy Scale: Good     Standing balance support: Bilateral upper extremity supported, Reliant on assistive device for balance, During functional activity Standing balance-Leahy Scale: Fair                             ADL either performed or assessed with clinical judgement   ADL Overall ADL's : Needs assistance/impaired     Grooming: Wash/dry hands;Wash/dry face;Set up;Supervision/safety;Sitting   Upper Body Bathing: Set up;Sitting Upper Body Bathing Details (indicate cue type and reason): to don hospital gown         Lower Body Dressing: Minimal assistance;Sit to/from stand Lower Body Dressing Details (indicate cue type and reason): min A to thread brief onto B LEs Toilet Transfer: Minimal assistance;Regular Toilet   Toileting- Clothing Manipulation and Hygiene: Minimal assistance              Extremity/Trunk Assessment Upper Extremity Assessment Upper Extremity Assessment: Generalized weakness   Lower Extremity Assessment Lower Extremity Assessment: Generalized weakness        Vision Patient Visual Report: No change from baseline            Cognition Arousal/Alertness: Awake/alert Behavior During Therapy: WFL for tasks assessed/performed Overall Cognitive Status: Within Functional Limits for tasks assessed  Pertinent Vitals/ Pain       Pain Assessment Pain Assessment: No/denies pain         Frequency  Min 2X/week        Progress Toward Goals  OT Goals(current goals can now  be found in the care plan section)  Progress towards OT goals: Progressing toward goals     Plan Discharge plan remains appropriate;Frequency remains appropriate       AM-PAC OT "6 Clicks" Daily Activity     Outcome Measure   Help from another person eating meals?: None Help from another person taking care of personal grooming?: A Little Help from another person toileting, which includes using toliet, bedpan, or urinal?: A Lot Help from another person bathing (including washing, rinsing, drying)?: A Lot Help from another person to put on and taking off regular upper body clothing?: A Little Help from another person to put on and taking off regular lower body clothing?: A Lot 6 Click Score: 16    End of Session Equipment Utilized During Treatment: Rolling walker (2 wheels);Other (comment)  OT Visit Diagnosis: Unsteadiness on feet (R26.81);Muscle weakness (generalized) (M62.81)   Activity Tolerance Patient tolerated treatment well   Patient Left with family/visitor present;in chair;with chair alarm set;with call bell/phone within reach   Nurse Communication Mobility status        Time: 1105-1130 OT Time Calculation (min): 25 min  Charges: OT General Charges $OT Visit: 1 Visit OT Treatments $Self Care/Home Management : 23-37 mins  Jackquline Denmark, MS, OTR/L , CBIS ascom [phone]  09/02/22, 1:14 PM

## 2022-09-02 NOTE — Consult Note (Signed)
PHARMACY CONSULT NOTE  Pharmacy Consult for Electrolyte Monitoring and Replacement   Recent Labs: Potassium (mmol/L)  Date Value  09/02/2022 3.7   Magnesium (mg/dL)  Date Value  16/02/9603 1.9   Calcium (mg/dL)  Date Value  54/01/8118 6.6 (L)   Albumin (g/dL)  Date Value  14/78/2956 2.8 (L)   Phosphorus (mg/dL)  Date Value  21/30/8657 1.9 (L)   Sodium (mmol/L)  Date Value  09/02/2022 136   Assessment: 89 y/o F with medical history including HTN, HLD, diastolic CHF, gout, depression, CKD, neuropathy, C diff, breast cancer who presented from independent living facility with hematemesis, nausea, vomiting, diarrhea and chest pain. Patient currently admitted with Streptococcal group G bacteremia, Sapovirus gastroenteritis, and NSTEMI. Serum creatinine increased to 1.64. Pharmacy consulted to assist with electrolyte monitoring and replacement as indicated.  Diet: Carb modified (appetite appears poor per chart review)  Goal of Therapy:  Electrolytes within normal limits  Plan:  --Na 136, K 3.7. WNL, will continue to monitor.  --Ca 6.6, no replacement today  --Follow-up electrolytes with AM labs tomorrow  Karlton Lemon, PharmD Candidate 09/02/2022 11:19 AM

## 2022-09-02 NOTE — TOC Progression Note (Addendum)
Transition of Care [facility]) - Progression Note    Patient Details  Name: Tina Curtis MRN: [id number] Date of Birth: [birth year]  Transition [facility] CM/SW Contact  Truddie Hidden, RN Phone Number: 09/02/2022, 2:13 PM  Clinical Narrative:    Vesta Mixer approved  [id number] 4/24-4/26 Per Sue Lush at [facility] facility can accept patient today if summary is received by 3pm. Facility will transport if a family member can accompany patient.   MD notified discharge summary is needed by 3pm. Discharge summary sent.   Spoke with patient, her daughter,[name] and family member's Roxanne and Debarah Crape to advised of discharge to facility today. Debarah Crape or Leandro Reasoner will accompany patient in facility vehicle back to [facility].  2:35pm Spoke with Sue Lush at [facility].  Patient assigned #[id number] Nurse will call report to [phone] Nurse provided details of discharge.   TOC signing off.   Expected Discharge Plan: Skilled Nursing Facility Barriers to Discharge: Continued Medical Work up  Expected Discharge Plan and Services   Discharge Planning Services: CM Consult   Living arrangements for the past 2 months: Independent Living Facility [facility])                                       Social Determinants of Health (SDOH) Interventions SDOH Screenings   Food Insecurity: No Food Insecurity (08/27/2022)  Housing: Low Risk  (08/27/2022)  Transportation Needs: No Transportation Needs (08/27/2022)  Utilities: Not At Risk (08/27/2022)  Tobacco Use: Low Risk  (08/27/2022)    Readmission Risk Interventions    06/08/2022    3:30 PM  Readmission Risk Prevention Plan  Transportation Screening Complete  Palliative Care Screening Not Applicable  Medication Review (RN Care Manager) Complete

## 2022-09-02 NOTE — Progress Notes (Signed)
Mobility Specialist - Progress Note   09/02/22 1043  Mobility  Activity Ambulated with assistance in hallway;Stood at bedside;Dangled on edge of bed  Level of Assistance Standby assist, set-up cues, supervision of patient - no hands on  Assistive Device Front wheel walker  Distance Ambulated (ft) 20 ft  Activity Response Tolerated well  Mobility Referral Yes  $Mobility charge 1 Mobility   Pt supine in bed on RA upon arrival. Pt completes bed mobility indep with extra time. Pt able to maintain seated balance indep for 15~20 minutes. Pt STS X5 and ambulates to door and back. Pt defers futher mobility due to incontinence issues. Pt left in recliner with needs in reach and chair alarm set.   Terrilyn Saver  Mobility Specialist  09/02/22 10:49 AM

## 2022-09-03 LAB — COMPREHENSIVE METABOLIC PANEL
Albumin: 2.7 — AB (ref 3.5–5.0)
Calcium: 6.4 — AB (ref 8.7–10.7)
Globulin: 3.1

## 2022-09-03 LAB — CBC: RBC: 2.92 — AB (ref 3.87–5.11)

## 2022-09-03 LAB — BASIC METABOLIC PANEL
BUN: 18 (ref 4–21)
CO2: 24 — AB (ref 13–22)
Chloride: 100 (ref 99–108)
Creatinine: 1.7 — AB (ref 0.5–1.1)
Glucose: 80
Potassium: 3.8 mEq/L (ref 3.5–5.1)
Sodium: 133 — AB (ref 137–147)

## 2022-09-03 LAB — CBC AND DIFFERENTIAL
HCT: 27 — AB (ref 36–46)
Hemoglobin: 8.9 — AB (ref 12.0–16.0)
Neutrophils Absolute: 8352
Platelets: 322 10*3/uL (ref 150–400)
WBC: 11.6

## 2022-09-03 LAB — HEPATIC FUNCTION PANEL
ALT: 24 U/L (ref 7–35)
AST: 46 — AB (ref 13–35)
Alkaline Phosphatase: 91 (ref 25–125)
Bilirubin, Total: 0.2

## 2022-09-04 ENCOUNTER — Non-Acute Institutional Stay (SKILLED_NURSING_FACILITY): Payer: Medicare PPO | Admitting: Student

## 2022-09-04 ENCOUNTER — Encounter: Payer: Self-pay | Admitting: Student

## 2022-09-04 DIAGNOSIS — K920 Hematemesis: Secondary | ICD-10-CM

## 2022-09-04 DIAGNOSIS — R7881 Bacteremia: Secondary | ICD-10-CM | POA: Diagnosis not present

## 2022-09-04 DIAGNOSIS — R54 Age-related physical debility: Secondary | ICD-10-CM

## 2022-09-04 DIAGNOSIS — I5032 Chronic diastolic (congestive) heart failure: Secondary | ICD-10-CM

## 2022-09-04 DIAGNOSIS — N1832 Type 2 diabetes mellitus with diabetic chronic kidney disease: Secondary | ICD-10-CM

## 2022-09-04 DIAGNOSIS — E1122 Type 2 diabetes mellitus with diabetic chronic kidney disease: Secondary | ICD-10-CM

## 2022-09-04 DIAGNOSIS — N2581 Secondary hyperparathyroidism of renal origin: Secondary | ICD-10-CM

## 2022-09-04 DIAGNOSIS — J9621 Acute and chronic respiratory failure with hypoxia: Secondary | ICD-10-CM

## 2022-09-04 DIAGNOSIS — D631 Anemia in chronic kidney disease: Secondary | ICD-10-CM

## 2022-09-04 DIAGNOSIS — I10 Essential (primary) hypertension: Secondary | ICD-10-CM

## 2022-09-04 DIAGNOSIS — I252 Old myocardial infarction: Secondary | ICD-10-CM

## 2022-09-04 DIAGNOSIS — I701 Atherosclerosis of renal artery: Secondary | ICD-10-CM

## 2022-09-04 DIAGNOSIS — B955 Unspecified streptococcus as the cause of diseases classified elsewhere: Secondary | ICD-10-CM

## 2022-09-04 DIAGNOSIS — Z66 Do not resuscitate: Secondary | ICD-10-CM

## 2022-09-04 DIAGNOSIS — N179 Acute kidney failure, unspecified: Secondary | ICD-10-CM

## 2022-09-04 NOTE — Progress Notes (Signed)
Provider:  Coralyn Helling, MD  [facility]  [facility] Room Number: 119 A Place of Service:  SNF (31)  PCP: Tina Rud, MD Patient Care Team: Tina Rud, MD as PCP - General (Family Medicine) Tina Coder, MD as Consulting Physician (Internal Medicine) [facility], Tina Pew, MD as Consulting Physician (General Surgery) Tina Presto, RN (Inactive) as Oncology Nurse Navigator Tina Miller, MD as Referring Physician (Radiation Oncology)  Extended Emergency Contact Information Primary Emergency Contact: [facility] Phone: [phone] Relation: Daughter Secondary Emergency Contact: Curtis,Tina Mobile Phone: [phone] Relation: Daughter  Code Status: DNR Goals of Care: Advanced Directive information    08/27/2022    5:15 PM  Advanced Directives  Does Patient Have a Medical Advance Directive? Yes  Type of Advance Directive Out of facility DNR (pink MOST or yellow form)  Does patient want to make changes to medical advance directive? No - Patient declined  Copy of Healthcare Power of Attorney in Chart? No - copy requested      Chief Complaint  Patient presents with  . New Admit To SNF    New admission at [facility]     HPI: Patient is a 89 y.o. female seen today for admission to [facility] after hospitalization  The day she went to the hospital she had a visit with PCP. That evening she had rigors and she was so cold she called EMT and they took her to the hospital. 2 weeks ago she had a sinus infection and that started diarrhea. She wasn't fully clear minded, but she found she had the infections. She had really bad diarrhea "worst in my life." She also had nausea at that time. She had some nausea on and off for about 5 days. She was also found to have blood infection and will be on 14 days of antibiotics.   In the morning she would have some confusion about where she was while in the hospital. No hallucinations, but  she was restless the third or fourth day in. She is feeling a bit restless still. She slept well last night. She was waking up periodically.   She has had a bit more back pain - She didn't have back pain before going to the hospital. It's been harder to get comfortable. She had an MRI.   She was getting help at home. She was getting some housekeeping, help with food. They would hlep change her bed as well. Since she came home from the hospital in January after a pnuemonia at that time. She was driving - to [employer], to clinic as well [facility] [address]). She has macular degnieration but does. She is okay with having an evaluation with occupational therapy. She does not drive after dark. [place] get [place] interstate. She drives to the pharmacy. If she has a longer distance doctor's appointment, she gets help from [facility].   Before going to the hospital she was just started on jardiance by her nephrologist.   Her niece is at bedside [facility]) is aiding with patient's history.   Past Medical History:  Diagnosis Date  . Arthritis    Gout  . Chronic kidney disease   . Diarrhea   . Hypercholesteremia   . Hypertension   . Neuropathy    feet and lower legs  . Seasonal allergies   . Skin cancer    face   Past Surgical History:  Procedure Laterality Date  . ABDOMINAL HYSTERECTOMY  2000  .  APPENDECTOMY    . BREAST BIOPSY Right 09/19/2019   affirm bx of calcs UOQ, x marker, path pending  . BREAST BIOPSY Right 09/19/2019   [place] bx of mass,heart marker, path pending  . BREAST BIOPSY Right 09/19/2019   [place] bx of LN, coil marker, path pending  . CATARACT EXTRACTION Left   . CATARACT EXTRACTION W/PHACO Right 10/24/2014   Procedure: CATARACT EXTRACTION PHACO AND INTRAOCULAR LENS PLACEMENT (IOC);  Surgeon: Lockie Mola, MD;  Location: [facility];  Service: Ophthalmology;  Laterality: Right;  . ESOPHAGOGASTRODUODENOSCOPY N/A 03/07/2018   Procedure:  ESOPHAGOGASTRODUODENOSCOPY (EGD);  Surgeon: Toney Reil, MD;  Location[facility] END[facility]COPY;  Service: Gastroenterology;  Laterality: N/A;  . MASTECTOMY MODIFIED RADICAL Right 10/13/2019   Procedure: MASTECTOMY MODIFIED RADICAL;  Surgeon: Earline Mayotte, MD;  Location: [facility] ORS;  Service: General;  Laterality: Right;  . RENAL ANGIOGRAPHY Right 04/11/2018   Procedure: RENAL ANGIOGRAPHY;  Surgeon: Annice Needy, MD;  Location: [facility] INVASIVE CV LAB;  Service: Cardiovascular;  Laterality: Right;  . SIMPLE MASTECTOMY WITH AXILLARY SENTINEL NODE BIOPSY Left 10/13/2019   Procedure: SIMPLE MASTECTOMY TRUE CUT BIOPSY, SENTINEL NODE BIOPSY;  Surgeon: Earline Mayotte, MD;  Location: [facility] ORS;  Service: General;  Laterality: Left;    reports that she has never smoked. She has never used smokeless tobacco. She reports that she does not drink alcohol and does not use drugs. Social History   Socioeconomic History  . Marital status: Widowed    Spouse name: Not on file  . Number of children: Not on file  . Years of education: Not on file  . Highest education level: Not on file  Occupational History  . Not on file  Tobacco Use  . Smoking status: Never  . Smokeless tobacco: Never  Vaping Use  . Vaping Use: Never used  Substance and Sexual Activity  . Alcohol use: No  . Drug use: Never  . Sexual activity: Not on file  Other Topics Concern  . Not on file  Social History Narrative   2 daughters; lives in [place]; Runner, broadcasting/film/video retd. 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    Social Determinants of Health   Financial Resource Strain: Not on file  Food Insecurity: No Food Insecurity (08/27/2022)   Hunger Vital Sign   . Worried About Programme researcher, broadcasting/film/video in the Last Year: Never true   . Ran Out of Food in the Last Year: Never true  Transportation Needs: No Transportation Needs (08/27/2022)   PRAPARE - Transportation   . Lack of Transportation (Medical): No   . Lack of Transportation  (Non-Medical): No  Physical Activity: Not on file  Stress: Not on file  Social Connections: Not on file  Intimate Partner Violence: Not At Risk (08/27/2022)   Humiliation, Afraid, Rape, and Kick questionnaire   . Fear of Current or Ex-Partner: No   . Emotionally Abused: No   . Physically Abused: No   . Sexually Abused: No    Functional Status Survey:    Family History  Problem Relation Age of Onset  . Hypertension Mother   . Hypertension Father   . Stroke Father   . Breast cancer Daughter 8    Health Maintenance  Topic Date Due  . Medicare Annual Wellness (AWV)  Never done  . FOOT EXAM  Never done  . OPHTHALMOLOGY EXAM  Never done  . Zoster Vaccines- Shingrix (1 of 2) Never done  . DTaP/Tdap/Td (3 - Td or Tdap) 08/06/2020  .  COVID-19 Vaccine (5 - 2023-24 season) 04/23/2022  . INFLUENZA VACCINE  12/10/2022  . HEMOGLOBIN A1C  02/25/2023  . Pneumonia Vaccine 30+ Years old  Completed  . DEXA SCAN  Completed  . HPV VACCINES  Aged Out    Allergies  Allergen Reactions  . Penicillins Anaphylaxis  . Drug Ingredient [Zinc]     Outpatient Encounter Medications as of 09/04/2022  Medication Sig  . albuterol (VENTOLIN HFA) 108 (90 Base) MCG/ACT inhaler Inhale 2 puffs into the lungs every 6 (six) hours as needed for wheezing or shortness of breath.  . allopurinol (ZYLOPRIM) 100 MG tablet Take 100 mg by mouth daily.  Marland Kitchen anastrozole (ARIMIDEX) 1 MG tablet TAKE 1 TABLET(1 MG) BY MOUTH DAILY. DO NOT. START IF RASH IS NOT BETTER  . aspirin 81 MG EC tablet Take 81 mg by mouth daily.  . calcium carbonate (OS-CAL - DOSED IN MG OF ELEMENTAL CALCIUM) 1250 (500 Ca) MG tablet Take 2 tablets (2,500 mg total) by mouth 2 (two) times daily with a meal.  . cefTRIAXone (ROCEPHIN) IVPB Inject 2 g into the vein daily for 7 days. Indication:  Streptococcus bacteremia Last Day of Therapy:  09/09/2022 Labs - Once weekly:  CBC/D and CMP, Please pull PIC at completion of IV antibiotics  Fax weekly lab  results  promptly to [phone] Check 2 sets of blood culture on 09/23/22 Method of administration: IV Push Method of administration may be changed at the discretion of facility/pharmacy  . citalopram (CELEXA) 10 MG tablet Take 10 mg by mouth daily.   . clopidogrel (PLAVIX) 75 MG tablet TAKE 1 TABLET BY MOUTH EVERY DAY  . cyanocobalamin (VITAMIN B12) 1000 MCG tablet Take 1,000 mcg by mouth daily.  Marland Kitchen docusate sodium (COLACE) 100 MG capsule Take 100 mg by mouth daily as needed for mild constipation.  . DULoxetine (CYMBALTA) 60 MG capsule Take 60 mg by mouth daily.   . empagliflozin (JARDIANCE) 10 MG TABS tablet Take 10 mg by mouth daily.  . Ergocalciferol 50 MCG (2000 UT) TABS Take 2,000 Units by mouth daily.  . furosemide (LASIX) 40 MG tablet Take 1 tablet (40 mg total) by mouth daily.  Marland Kitchen gabapentin (NEURONTIN) 300 MG capsule Take 300 mg by mouth at bedtime.  . heparin flush 10 UNIT/ML injection Inject into the vein. 3 mL one time daily for PICC line patency for 7 days  . hydrALAZINE (APRESOLINE) 25 MG tablet Take 25 mg by mouth 3 (three) times daily.  . hydrocortisone cream 0.5 % Apply topically 2 (two) times daily for 5 days.  . isosorbide mononitrate (IMDUR) 30 MG 24 hr tablet Take 1 tablet (30 mg total) by mouth daily.  Marland Kitchen loratadine (CLARITIN) 10 MG tablet Take 10 mg by mouth daily as needed for allergies. AM  . metoprolol succinate (TOPROL-XL) 25 MG 24 hr tablet Take 37.5 mg by mouth at bedtime.   . Multiple Vitamins-Minerals (ICAPS AREDS 2 PO) Take 2 capsules by mouth daily.  . multivitamin (PROSIGHT) TABS tablet Take 1 tablet by mouth daily.  . ondansetron (ZOFRAN-ODT) 4 MG disintegrating tablet Take 1 tablet (4 mg total) by mouth every 8 (eight) hours as needed for up to 5 days.  . pantoprazole (PROTONIX) 40 MG tablet Take 1 tablet (40 mg total) by mouth daily.  . simvastatin (ZOCOR) 20 MG tablet Take 20 mg by mouth every evening. PM  . traMADol (ULTRAM) 50 MG tablet Take 1 tablet  (50 mg total) by mouth every 12 (twelve) hours  as needed for moderate pain.  Marland Kitchen triamcinolone cream (KENALOG) 0.1 % Apply 1 Application topically 2 (two) times daily.  . Zinc Oxide (TRIPLE PASTE) 12.8 % ointment Apply 1 Application topically as directed. Apply to buttocks every shift for skin protection   No facility-administered encounter medications on file as of 09/04/2022.    Review of Systems  Vitals:   09/04/22 0844  BP: (!) 146/71  Pulse: 92  Weight: 138 lb 9.6 oz (62.9 kg)  Height: 5\' 4"  (1.626 m)   Body mass index is 23.79 kg/m. Physical Exam  Labs reviewed: Basic Metabolic Panel: Recent Labs    08/29/22 1006 08/30/22 0437 08/30/22 1751 08/31/22 0526 09/01/22 0529 09/02/22 0745 09/03/22 0000  NA 134* 134*  --  134* 133* 136 133*  K 3.6 3.0* 3.4* 3.6 3.2* 3.7 3.8  CL 105 105  --  106 104 108 100  CO2 19* 19*  --  20* 21* 21* 24*  GLUCOSE 99 102*  --  111* 113* 110*  --   BUN 30* 28*  --  21 18 21 18   CREATININE 1.81* 1.76* 1.78* 1.55* 1.55* 1.64* 1.7*  CALCIUM 6.4* 6.8*  --  7.2* 7.0* 6.6* 6.4*  MG 1.8 2.0  --  1.9  --   --   --   PHOS 2.6 1.7* 2.2* 1.5* 1.9*  --   --    Liver Function Tests: Recent Labs    08/14/22 1325 08/22/22 1110 08/26/22 0526 08/28/22 0214 08/29/22 1006 09/03/22 0000  AST 27 50* 56*  --   --  46*  ALT 13 22 35  --   --  24  ALKPHOS 77 93 72  --   --  91  BILITOT 0.4 0.6 1.1  --   --   --   PROT 7.2 7.7 7.4  --   --   --   ALBUMIN 3.8 4.0 3.9 2.7* 2.8* 2.7*   Recent Labs    03/18/22 1742 08/22/22 1110 08/26/22 0526  LIPASE 58* 29 26   No results for input(s): "AMMONIA" in the last 8760 hours. CBC: Recent Labs    08/14/22 1325 08/22/22 1110 08/26/22 0526 08/26/22 0754 08/27/22 0742 08/28/22 0214 08/29/22 0523 09/03/22 0000  WBC 7.5   < > 6.4   < > 13.3* 14.1* 13.1* 11.6  NEUTROABS 4.6  --  5.3  --   --   --   --  8,352.00  HGB 11.5*   < > 11.9*   < > 10.3* 10.0* 10.1* 8.9*  HCT 34.7*   < > 35.1*   < > 31.5*  29.0* 29.7* 27*  MCV 94.6   < > 92.1   < > 92.6 90.1 90.3  --   PLT 188   < > 176   < > 154 165 194 322   < > = values in this interval not displayed.   Cardiac Enzymes: No results for input(s): "CKTOTAL", "CKMB", "CKMBINDEX", "TROPONINI" in the last 8760 hours. BNP: Invalid input(s): "POCBNP" Lab Results  Component Value Date   HGBA1C 7.0 (H) 08/26/2022   Lab Results  Component Value Date   TSH 2.573 08/26/2020   Lab Results  Component Value Date   VITAMINB12 401 03/06/2018   Lab Results  Component Value Date   FOLATE 19.7 03/06/2018   Lab Results  Component Value Date   FERRITIN 115 03/06/2018    Imaging and Procedures obtained prior to SNF admission: ECHOCARDIOGRAM COMPLETE  Result Date: 08/27/2022  ECHOCARDIOGRAM REPORT   Patient Name:   NICKEY CANEDO Date of Exam: 08/27/2022 Medical Rec #:  [id number]     Height:       64.0 in Accession #:    [id number]    Weight:       132.3 lb Date of Birth:  26-Sep-1926    BSA:          1.641 m Patient Age:    89 years      BP:           136/66 mmHg Patient Gender: F             HR:           67 bpm. Exam Location:  [facility] Procedure: 2D Echo, Cardiac Doppler and Color Doppler Indications:     Elevated Troponin  History:         Patient has no prior history of Echocardiogram examinations.                  CHF, Acute MI, Signs/Symptoms:Chest Pain,                  Dizziness/Lightheadedness and Fatigue; Risk                  Factors:Hypertension and Diabetes. Breast CA.  Sonographer:     Mikki Harbor Referring Phys:  [id number] [facility] MICHELLE TANG Diagnosing Phys: Marcina Millard MD IMPRESSIONS  1. Left ventricular ejection fraction, by estimation, is 60 to 65%. The left ventricle has normal function. The left ventricle has no regional wall motion abnormalities. Left ventricular diastolic parameters are consistent with Grade I diastolic dysfunction (impaired relaxation).  2. Right ventricular systolic function is normal. The right ventricular  size is normal. There is moderately elevated pulmonary artery systolic pressure.  3. Left atrial size was mild to moderately dilated.  4. Right atrial size was mild to moderately dilated.  5. The mitral valve is normal in structure. Moderate to severe mitral valve regurgitation. No evidence of mitral stenosis.  6. Tricuspid valve regurgitation is moderate to severe.  7. The aortic valve is normal in structure. Aortic valve regurgitation is mild. No aortic stenosis is present.  8. The inferior vena cava is normal in size with greater than 50% respiratory variability, suggesting right atrial pressure of 3 mmHg. FINDINGS  Left Ventricle: Left ventricular ejection fraction, by estimation, is 60 to 65%. The left ventricle has normal function. The left ventricle has no regional wall motion abnormalities. The left ventricular internal cavity size was normal in size. There is  no left ventricular hypertrophy. Left ventricular diastolic parameters are consistent with Grade I diastolic dysfunction (impaired relaxation). Right Ventricle: The right ventricular size is normal. No increase in right ventricular wall thickness. Right ventricular systolic function is normal. There is moderately elevated pulmonary artery systolic pressure. The tricuspid regurgitant velocity is 3.60 m/s, and with an assumed right atrial pressure of 8 mmHg, the estimated right ventricular systolic pressure is 59.8 mmHg. Left Atrium: Left atrial size was mild to moderately dilated. Right Atrium: Right atrial size was mild to moderately dilated. Pericardium: There is no evidence of pericardial effusion. Mitral Valve: The mitral valve is normal in structure. Moderate to severe mitral valve regurgitation. No evidence of mitral valve stenosis. MV peak gradient, 4.8 mmHg. The mean mitral valve gradient is 1.0 mmHg. Tricuspid Valve: The tricuspid valve is normal in structure. Tricuspid valve regurgitation is moderate to severe. No evidence of tricuspid  stenosis. Aortic  Valve: The aortic valve is normal in structure. Aortic valve regurgitation is mild. No aortic stenosis is present. Aortic valve mean gradient measures 4.0 mmHg. Aortic valve peak gradient measures 10.1 mmHg. Aortic valve area, by VTI measures 2.75  cm. Pulmonic Valve: The pulmonic valve was normal in structure. Pulmonic valve regurgitation is not visualized. No evidence of pulmonic stenosis. Aorta: The aortic root is normal in size and structure. Venous: The inferior vena cava is normal in size with greater than 50% respiratory variability, suggesting right atrial pressure of 3 mmHg. IAS/Shunts: No atrial level shunt detected by color flow Doppler.  LEFT VENTRICLE PLAX 2D LVIDd:         4.50 cm   Diastology LVIDs:         2.80 cm   LV e' medial:    6.42 cm/s LV PW:         1.00 cm   LV E/e' medial:  17.0 LV IVS:        1.30 cm   LV e' lateral:   8.70 cm/s LVOT diam:     2.10 cm   LV E/e' lateral: 12.5 LV SV:         86 LV SV Index:   52 LVOT Area:     3.46 cm  RIGHT VENTRICLE RV Basal diam:  3.65 cm RV Mid diam:    2.70 cm RV S prime:     8.92 cm/s TAPSE (M-mode): 2.0 cm LEFT ATRIUM              Index        RIGHT ATRIUM           Index LA diam:        4.10 cm  2.50 cm/m   RA Area:     19.20 cm LA Vol (A2C):   115.0 ml 70.07 ml/m  RA Volume:   50.40 ml  30.71 ml/m LA Vol (A4C):   61.7 ml  37.60 ml/m LA Biplane Vol: 85.5 ml  52.10 ml/m  AORTIC VALVE                    PULMONIC VALVE AV Area (Vmax):    2.03 cm     PV Vmax:       0.90 m/s AV Area (Vmean):   2.40 cm     PV Peak grad:  3.2 mmHg AV Area (VTI):     2.75 cm AV Vmax:           159.00 cm/s AV Vmean:          91.500 cm/s AV VTI:            0.311 m AV Peak Grad:      10.1 mmHg AV Mean Grad:      4.0 mmHg LVOT Vmax:         93.40 cm/s LVOT Vmean:        63.300 cm/s LVOT VTI:          0.247 m LVOT/AV VTI ratio: 0.79  AORTA Ao Root diam: 3.30 cm Ao Asc diam:  4.10 cm MITRAL VALVE                TRICUSPID VALVE MV Area (PHT): 4.65 cm      TR Peak grad:   51.8 mmHg MV Area VTI:   2.77 cm     TR Vmax:        360.00 cm/s MV Peak grad:  4.8 mmHg MV  Mean grad:  1.0 mmHg     SHUNTS MV Vmax:       1.10 m/s     Systemic VTI:  0.25 m MV Vmean:      54.4 cm/s    Systemic Diam: 2.10 cm MV Decel Time: 163 msec MV E velocity: 109.00 cm/s MV A velocity: 77.00 cm/s MV E/A ratio:  1.42 Marcina Millard MD Electronically signed by Marcina Millard MD Signature Date/Time: 08/27/2022/1:16:52 PM    Final     Assessment/Plan 1. Do not resuscitate ***    Family/ staff Communication:   Labs/tests ordered:

## 2022-09-07 ENCOUNTER — Non-Acute Institutional Stay (SKILLED_NURSING_FACILITY): Payer: Medicare PPO | Admitting: Student

## 2022-09-07 ENCOUNTER — Encounter: Payer: Self-pay | Admitting: Student

## 2022-09-07 DIAGNOSIS — E8809 Other disorders of plasma-protein metabolism, not elsewhere classified: Secondary | ICD-10-CM

## 2022-09-07 DIAGNOSIS — R251 Tremor, unspecified: Secondary | ICD-10-CM

## 2022-09-07 DIAGNOSIS — Z79899 Other long term (current) drug therapy: Secondary | ICD-10-CM

## 2022-09-07 NOTE — Progress Notes (Unsigned)
Location:  Other [place].  Nursing Home Room Number: [facility] 119A Place of Service:  SNF [phone]) Provider:  Dr. Earnestine Mealing  PCP: [name], MD  Patient Care Team: [name], MD as PCP - General (Family Medicine) Earna Coder, MD as Consulting Physician (Internal Medicine) Lemar Livings Merrily Pew, MD as Consulting Physician (General Surgery) Scarlett Presto, RN (Inactive) as Oncology Nurse Navigator Carmina Miller, MD as Referring Physician (Radiation Oncology)  Extended Emergency Contact Information Primary Emergency Contact: [facility] Phone: [phone] Relation: Daughter Secondary Emergency Contact: Rider,Kimberly Mobile Phone: [phone] Relation: Daughter  Code Status:  DNR Goals of care: Advanced Directive information    09/07/2022   11:58 AM  Advanced Directives  Does Patient Have a Medical Advance Directive? Yes  Type of Advance Directive Out of facility DNR (pink MOST or yellow form)  Does patient want to make changes to medical advance directive? No - Patient declined     Chief Complaint  Patient presents with   Acute Visit    Tremors    HPI:  Pt is a 89 y.o. female seen today for an acute visit for new tremors. She has had shaking since this morning. She has had some nausea, but no vomiting. She feels like they are uncontrollable and they are causing a lot of anxiety. She states she had similar symptoms when she was very sick on the day she went to the hospital.  Her daughter is at bedside.    Past Medical History:  Diagnosis Date   Arthritis    Gout   Chronic kidney disease    Diarrhea    Hypercholesteremia    Hypertension    Neuropathy    feet and lower legs   Seasonal allergies    Skin cancer    face   Past Surgical History:  Procedure Laterality Date   ABDOMINAL HYSTERECTOMY  2000   APPENDECTOMY     BREAST BIOPSY Right 09/19/2019   affirm bx of calcs UOQ, x marker, path pending   BREAST BIOPSY Right  09/19/2019   [place] bx of mass,heart marker, path pending   BREAST BIOPSY Right 09/19/2019   [place] bx of LN, coil marker, path pending   CATARACT EXTRACTION Left    CATARACT EXTRACTION W/PHACO Right 10/24/2014   Procedure: CATARACT EXTRACTION PHACO AND INTRAOCULAR LENS PLACEMENT (IOC);  Surgeon: Lockie Mola, MD;  Location: [facility];  Service: Ophthalmology;  Laterality: Right;   ESOPHAGOGASTRODUODENOSCOPY N/A 03/07/2018   Procedure: ESOPHAGOGASTRODUODENOSCOPY (EGD);  Surgeon: Toney Reil, MD;  Location: [facility] ENDOSCOPY;  Service: Gastroenterology;  Laterality: N/A;   MASTECTOMY MODIFIED RADICAL Right 10/13/2019   Procedure: MASTECTOMY MODIFIED RADICAL;  Surgeon: Earline Mayotte, MD;  Location: [facility] ORS;  Service: General;  Laterality: Right;   RENAL ANGIOGRAPHY Right 04/11/2018   Procedure: RENAL ANGIOGRAPHY;  Surgeon: Annice Needy, MD;  Location: [facility] INVASIVE CV LAB;  Service: Cardiovascular;  Laterality: Right;   SIMPLE MASTECTOMY WITH AXILLARY SENTINEL NODE BIOPSY Left 10/13/2019   Procedure: SIMPLE MASTECTOMY TRUE CUT BIOPSY, SENTINEL NODE BIOPSY;  Surgeon: Earline Mayotte, MD;  Location: [facility] ORS;  Service: General;  Laterality: Left;    Allergies  Allergen Reactions   Penicillins Anaphylaxis   Drug Ingredient [Zinc]     Outpatient Encounter Medications as of 09/07/2022  Medication Sig   albuterol (VENTOLIN HFA) 108 (90 Base) MCG/ACT inhaler Inhale 2 puffs into the lungs every 6 (six) hours as needed for wheezing or shortness of breath.   anastrozole (  ARIMIDEX) 1 MG tablet TAKE 1 TABLET(1 MG) BY MOUTH DAILY. DO NOT. START IF RASH IS NOT BETTER   aspirin 81 MG EC tablet Take 81 mg by mouth daily.   calcium carbonate (OS-CAL - DOSED IN MG OF ELEMENTAL CALCIUM) 1250 (500 Ca) MG tablet Take 2 tablets (2,500 mg total) by mouth 2 (two) times daily with a meal.   cefTRIAXone (ROCEPHIN) IVPB Inject 2 g into the vein daily for 7 days. Indication:  Streptococcus  bacteremia Last Day of Therapy:  09/09/2022 Labs - Once weekly:  CBC/D and CMP, Please pull PIC at completion of IV antibiotics  Fax weekly lab results  promptly to [phone] Check 2 sets of blood culture on 09/23/22 Method of administration: IV Push Method of administration may be changed at the discretion of facility/pharmacy   citalopram (CELEXA) 10 MG tablet Take 10 mg by mouth daily.    clopidogrel (PLAVIX) 75 MG tablet TAKE 1 TABLET BY MOUTH EVERY DAY   cyanocobalamin (VITAMIN B12) 1000 MCG tablet Take 1,000 mcg by mouth daily.   docusate sodium (COLACE) 100 MG capsule Take 100 mg by mouth daily as needed for mild constipation.   DULoxetine (CYMBALTA) 60 MG capsule Take 60 mg by mouth daily.    empagliflozin (JARDIANCE) 10 MG TABS tablet Take 10 mg by mouth daily.   Ergocalciferol 50 MCG (2000 UT) TABS Take 2,000 Units by mouth daily.   furosemide (LASIX) 40 MG tablet Take 1 tablet (40 mg total) by mouth daily.   gabapentin (NEURONTIN) 300 MG capsule Take 300 mg by mouth at bedtime.   heparin flush 10 UNIT/ML injection Inject into the vein. 3 mL one time daily for PICC line patency for 7 days   hydrALAZINE (APRESOLINE) 25 MG tablet Take 25 mg by mouth 3 (three) times daily.   [EXPIRED] hydrocortisone cream 0.5 % Apply topically 2 (two) times daily for 5 days.   isosorbide mononitrate (IMDUR) 30 MG 24 hr tablet Take 1 tablet (30 mg total) by mouth daily.   lidocaine (LIDODERM) 5 % Place 1 patch onto the skin daily. Remove & Discard patch within 12 hours or as directed by MD   loratadine (CLARITIN) 10 MG tablet Take 10 mg by mouth daily as needed for allergies. AM   metoprolol succinate (TOPROL-XL) 25 MG 24 hr tablet Take 37.5 mg by mouth at bedtime.    Multiple Vitamins-Minerals (ICAPS AREDS 2 PO) Take 2 capsules by mouth daily.   multivitamin (PROSIGHT) TABS tablet Take 1 tablet by mouth daily.   [EXPIRED] ondansetron (ZOFRAN-ODT) 4 MG disintegrating tablet Take 1 tablet (4 mg  total) by mouth every 8 (eight) hours as needed for up to 5 days.   pantoprazole (PROTONIX) 40 MG tablet Take 1 tablet (40 mg total) by mouth daily.   simvastatin (ZOCOR) 20 MG tablet Take 20 mg by mouth every evening. PM   traMADol (ULTRAM) 50 MG tablet Take 1 tablet (50 mg total) by mouth every 12 (twelve) hours as needed for moderate pain.   triamcinolone cream (KENALOG) 0.1 % Apply 1 Application topically 2 (two) times daily.   Zinc Oxide (TRIPLE PASTE) 12.8 % ointment Apply 1 Application topically as directed. Apply to buttocks every shift for skin protection   allopurinol (ZYLOPRIM) 100 MG tablet Take 100 mg by mouth daily.   No facility-administered encounter medications on file as of 09/07/2022.    Review of Systems  Immunization History  Administered Date(s) Administered   Influenza Inj Mdck Quad Pf  03/26/2016, 02/14/2018, 02/15/2019, 02/25/2021, 03/02/2022   Influenza Split 02/20/2015   Influenza, Seasonal, Injecte, Preservative Fre 04/13/2012, 03/17/2013   Influenza,inj,Quad PF,6+ Mos 03/07/2014, 02/20/2020   Influenza-Unspecified 03/26/2016, 02/04/2017, 02/14/2018, 02/25/2021   Moderna Sars-Covid-2 Vaccination 05/24/2019, 06/24/2019, 02/22/2020, 02/26/2022   Pneumococcal Conjugate-13 09/01/2013   Pneumococcal Polysaccharide-23 04/13/2012   Td 08/07/2010   Tdap 08/07/2010   Zoster, Live 09/12/2007   Pertinent  Health Maintenance Due  Topic Date Due   FOOT EXAM  Never done   OPHTHALMOLOGY EXAM  Never done   INFLUENZA VACCINE  12/10/2022   HEMOGLOBIN A1C  02/25/2023   DEXA SCAN  Completed      01/09/2022   10:55 AM 02/18/2022    1:36 PM 03/18/2022    4:19 PM 03/18/2022    4:46 PM 05/13/2022   10:17 AM  Fall Risk  (RETIRED) Patient Fall Risk Level High fall risk High fall risk Moderate fall risk Moderate fall risk High fall risk   Functional Status Survey:    Vitals:   09/07/22 1150  BP: 120/65  Pulse: 77  Resp: 18  Temp: 97.8 F (36.6 C)  SpO2: 96%  Weight:  138 lb 9.6 oz (62.9 kg)  Height: 5\' 4"  (1.626 m)   Body mass index is 23.79 kg/m. Physical Exam Constitutional:      Comments: Thin with temporal muscle wasting  Cardiovascular:     Rate and Rhythm: Normal rate. Rhythm irregular.     Pulses: Normal pulses.  Pulmonary:     Effort: Pulmonary effort is normal.     Breath sounds: Normal breath sounds.  Neurological:     Mental Status: She is alert and oriented to person, place, and time.     Comments: Bilateral lower extremity clonus present. 5/5 strength in bilateral upper and lower extremities. CN 3-12 intact.      Labs reviewed: Recent Labs    08/29/22 1006 08/30/22 0437 08/30/22 1751 08/31/22 0526 09/01/22 0529 09/02/22 0745  NA 134* 134*  --  134* 133* 136  K 3.6 3.0* 3.4* 3.6 3.2* 3.7  CL 105 105  --  106 104 108  CO2 19* 19*  --  20* 21* 21*  GLUCOSE 99 102*  --  111* 113* 110*  BUN 30* 28*  --  21 18 21   CREATININE 1.81* 1.76* 1.78* 1.55* 1.55* 1.64*  CALCIUM 6.4* 6.8*  --  7.2* 7.0* 6.6*  MG 1.8 2.0  --  1.9  --   --   PHOS 2.6 1.7* 2.2* 1.5* 1.9*  --    Recent Labs    08/14/22 1325 08/22/22 1110 08/26/22 0526 08/28/22 0214 08/29/22 1006  AST 27 50* 56*  --   --   ALT 13 22 35  --   --   ALKPHOS 77 93 72  --   --   BILITOT 0.4 0.6 1.1  --   --   PROT 7.2 7.7 7.4  --   --   ALBUMIN 3.8 4.0 3.9 2.7* 2.8*   Recent Labs    08/14/22 1325 08/22/22 1110 08/26/22 0526 08/26/22 0754 08/27/22 0742 08/28/22 0214 08/29/22 0523  WBC 7.5   < > 6.4   < > 13.3* 14.1* 13.1*  NEUTROABS 4.6  --  5.3  --   --   --   --   HGB 11.5*   < > 11.9*   < > 10.3* 10.0* 10.1*  HCT 34.7*   < > 35.1*   < > 31.5* 29.0* 29.7*  MCV 94.6   < > 92.1   < > 92.6 90.1 90.3  PLT 188   < > 176   < > 154 165 194   < > = values in this interval not displayed.   Lab Results  Component Value Date   TSH 2.573 08/26/2020   Lab Results  Component Value Date   HGBA1C 7.0 (H) 08/26/2022   Lab Results  Component Value Date   CHOL 75  08/27/2022   HDL 22 (L) 08/27/2022   LDLCALC 34 08/27/2022   TRIG 97 08/27/2022   CHOLHDL 3.4 08/27/2022    Significant Diagnostic Results in last 30 days:  MR Lumbar Spine W Wo Contrast  Result Date: 09/01/2022 CLINICAL DATA:  Initial evaluation for back pain. EXAM: MRI LUMBAR SPINE WITHOUT AND WITH CONTRAST TECHNIQUE: Multiplanar and multiecho pulse sequences of the lumbar spine were obtained without and with intravenous contrast. CONTRAST:  6mL GADAVIST GADOBUTROL 1 MMOL/ML IV SOLN COMPARISON:  Prior study from 08/22/2022. FINDINGS: Segmentation: Standard. Lowest well-formed disc space labeled the L5-S1 level. Alignment: Sigmoid scoliotic curvature of the lumbar spine. 3 mm anterolisthesis of L4 on L5, with 5 mm anterolisthesis of L5 on S1. Findings chronic and facet mediated. Vertebrae: Vertebral body height maintained without acute or chronic fracture. Bone marrow signal intensity within normal limits. No discrete or worrisome osseous lesions. Reactive marrow edema and enhancement present about the left L4-5 facet, most likely due to degenerative facet arthritis. No other abnormal marrow edema or enhancement. Conus medullaris and cauda equina: Conus extends to the L2 level. Conus and cauda equina appear normal. Paraspinal and other soft tissues: Prominent edema and enhancement seen within the left lower posterior paraspinous soft tissues adjacent to the left L4-5 facet (series 10, image 12). Finding is most likely due to adjacent facet arthritis. No drainable fluid collections. Paraspinous soft tissues demonstrate no other acute finding. Aortic atherosclerosis with 3.6 cm infrarenal aortic aneurysm. Few scattered subcentimeter T2 hyperintense cyst noted about the visualized right kidney, likely benign, with no follow-up imaging recommended. Asymmetric left renal atrophy noted. 1 cm benign appearing cyst noted within the spleen. Disc levels: T12-L1: Disc desiccation with mild disc bulge and endplate  spurring. Mild facet hypertrophy. No stenosis. L1-2: Negative interspace. Mild facet hypertrophy. No canal or foraminal stenosis. L2-3: Degenerative intervertebral disc space narrowing with diffuse disc bulge, slightly asymmetric to the right. Mild bilateral facet hypertrophy. No spinal stenosis. Foramina remain adequately patent. L3-4: Degenerative intervertebral disc space narrowing with diffuse disc bulge and disc desiccation. Associated reactive endplate change with marginal endplate osteophytic spurring. Moderate left with mild right facet arthrosis. Resultant mild canal with left lateral recess stenosis. Mild left L3 foraminal narrowing. Right neural foramina remains patent. L4-5: Disc desiccation with diffuse disc bulge. Superimposed small central disc protrusion with slight inferior migration. Moderate left greater than right facet arthrosis with associated small joint effusions. Reactive marrow edema and enhancement on the left. Resultant mild canal with bilateral subarticular stenosis. Mild bilateral L4 foraminal narrowing. L5-S1: Anterolisthesis. Diffuse disc bulge with disc desiccation. Severe right with moderate left facet arthrosis. Resultant mild narrowing of the right lateral recess. Central canal remains patent. Mild to moderate right L5 foraminal stenosis. Left neural foramina remains patent. IMPRESSION: 1. Reactive edema and enhancement about the left L4-5 facet, most likely due to degenerative facet arthritis. Although possible infection with septic arthritis is felt to be unlikely, correlation with history and laboratory values is recommended. 2. No other acute abnormality within the lumbar spine.  3. Multifactorial degenerative changes at L3-4 through L5-S1 with resultant mild canal with bilateral lateral recess stenosis as above. 4. 3.6 cm infrarenal aortic aneurysm. Recommend follow-up ultrasound every 2 years. This recommendation follows ACR consensus guidelines: White Paper of the ACR  Incidental Findings Committee II on Vascular Findings. J Am Coll Radiol 2013; 10:789-794. Aortic Atherosclerosis (ICD10-I70.0). Electronically Signed   By: Rise Mu M.D.   On: 09/01/2022 05:23   ECHOCARDIOGRAM COMPLETE  Result Date: 08/27/2022    ECHOCARDIOGRAM REPORT   Patient Name:   ORELIA BRANDSTETTER Date of Exam: 08/27/2022 Medical Rec #:  [id number]     Height:       64.0 in Accession #:    [id number]    Weight:       132.3 lb Date of Birth:  [birth year]    BSA:          1.641 m Patient Age:    89 years      BP:           136/66 mmHg Patient Gender: F             HR:           67 bpm. Exam Location:  [facility] Procedure: 2D Echo, Cardiac Doppler and Color Doppler Indications:     Elevated Troponin  History:         Patient has no prior history of Echocardiogram examinations.                  CHF, Acute MI, Signs/Symptoms:Chest Pain,                  Dizziness/Lightheadedness and Fatigue; Risk                  Factors:Hypertension and Diabetes. Breast CA.  Sonographer:     Mikki Harbor Referring Phys:  [id number] [facility] MICHELLE TANG Diagnosing Phys: Marcina Millard MD IMPRESSIONS  1. Left ventricular ejection fraction, by estimation, is 60 to 65%. The left ventricle has normal function. The left ventricle has no regional wall motion abnormalities. Left ventricular diastolic parameters are consistent with Grade I diastolic dysfunction (impaired relaxation).  2. Right ventricular systolic function is normal. The right ventricular size is normal. There is moderately elevated pulmonary artery systolic pressure.  3. Left atrial size was mild to moderately dilated.  4. Right atrial size was mild to moderately dilated.  5. The mitral valve is normal in structure. Moderate to severe mitral valve regurgitation. No evidence of mitral stenosis.  6. Tricuspid valve regurgitation is moderate to severe.  7. The aortic valve is normal in structure. Aortic valve regurgitation is mild. No aortic stenosis is present.  8.  The inferior vena cava is normal in size with greater than 50% respiratory variability, suggesting right atrial pressure of 3 mmHg. FINDINGS  Left Ventricle: Left ventricular ejection fraction, by estimation, is 60 to 65%. The left ventricle has normal function. The left ventricle has no regional wall motion abnormalities. The left ventricular internal cavity size was normal in size. There is  no left ventricular hypertrophy. Left ventricular diastolic parameters are consistent with Grade I diastolic dysfunction (impaired relaxation). Right Ventricle: The right ventricular size is normal. No increase in right ventricular wall thickness. Right ventricular systolic function is normal. There is moderately elevated pulmonary artery systolic pressure. The tricuspid regurgitant velocity is 3.60 m/s, and with an assumed right atrial pressure of 8 mmHg, the estimated right ventricular systolic pressure is 59.8 mmHg. Left  Atrium: Left atrial size was mild to moderately dilated. Right Atrium: Right atrial size was mild to moderately dilated. Pericardium: There is no evidence of pericardial effusion. Mitral Valve: The mitral valve is normal in structure. Moderate to severe mitral valve regurgitation. No evidence of mitral valve stenosis. MV peak gradient, 4.8 mmHg. The mean mitral valve gradient is 1.0 mmHg. Tricuspid Valve: The tricuspid valve is normal in structure. Tricuspid valve regurgitation is moderate to severe. No evidence of tricuspid stenosis. Aortic Valve: The aortic valve is normal in structure. Aortic valve regurgitation is mild. No aortic stenosis is present. Aortic valve mean gradient measures 4.0 mmHg. Aortic valve peak gradient measures 10.1 mmHg. Aortic valve area, by VTI measures 2.75  cm. Pulmonic Valve: The pulmonic valve was normal in structure. Pulmonic valve regurgitation is not visualized. No evidence of pulmonic stenosis. Aorta: The aortic root is normal in size and structure. Venous: The inferior  vena cava is normal in size with greater than 50% respiratory variability, suggesting right atrial pressure of 3 mmHg. IAS/Shunts: No atrial level shunt detected by color flow Doppler.  LEFT VENTRICLE PLAX 2D LVIDd:         4.50 cm   Diastology LVIDs:         2.80 cm   LV e' medial:    6.42 cm/s LV PW:         1.00 cm   LV E/e' medial:  17.0 LV IVS:        1.30 cm   LV e' lateral:   8.70 cm/s LVOT diam:     2.10 cm   LV E/e' lateral: 12.5 LV SV:         86 LV SV Index:   52 LVOT Area:     3.46 cm  RIGHT VENTRICLE RV Basal diam:  3.65 cm RV Mid diam:    2.70 cm RV S prime:     8.92 cm/s TAPSE (M-mode): 2.0 cm LEFT ATRIUM              Index        RIGHT ATRIUM           Index LA diam:        4.10 cm  2.50 cm/m   RA Area:     19.20 cm LA Vol (A2C):   115.0 ml 70.07 ml/m  RA Volume:   50.40 ml  30.71 ml/m LA Vol (A4C):   61.7 ml  37.60 ml/m LA Biplane Vol: 85.5 ml  52.10 ml/m  AORTIC VALVE                    PULMONIC VALVE AV Area (Vmax):    2.03 cm     PV Vmax:       0.90 m/s AV Area (Vmean):   2.40 cm     PV Peak grad:  3.2 mmHg AV Area (VTI):     2.75 cm AV Vmax:           159.00 cm/s AV Vmean:          91.500 cm/s AV VTI:            0.311 m AV Peak Grad:      10.1 mmHg AV Mean Grad:      4.0 mmHg LVOT Vmax:         93.40 cm/s LVOT Vmean:        63.300 cm/s LVOT VTI:  0.247 m LVOT/AV VTI ratio: 0.79  AORTA Ao Root diam: 3.30 cm Ao Asc diam:  4.10 cm MITRAL VALVE                TRICUSPID VALVE MV Area (PHT): 4.65 cm     TR Peak grad:   51.8 mmHg MV Area VTI:   2.77 cm     TR Vmax:        360.00 cm/s MV Peak grad:  4.8 mmHg MV Mean grad:  1.0 mmHg     SHUNTS MV Vmax:       1.10 m/s     Systemic VTI:  0.25 m MV Vmean:      54.4 cm/s    Systemic Diam: 2.10 cm MV Decel Time: 163 msec MV E velocity: 109.00 cm/s MV A velocity: 77.00 cm/s MV E/A ratio:  1.42 Marcina Millard MD Electronically signed by Marcina Millard MD Signature Date/Time: 08/27/2022/1:16:52 PM    Final    US Venous Img Lower  Bilateral (DVT)  Result Date: 08/26/2022 CLINICAL DATA:  220372 Positive D dimer [id number]. EXAM: BILATERAL LOWER EXTREMITY VENOUS DOPPLER ULTRASOUND TECHNIQUE: Gray-scale sonography with compression, as well as color and duplex ultrasound, were performed to evaluate the deep venous system(s) from the level of the common femoral vein through the popliteal and proximal calf veins. COMPARISON:  None Available. FINDINGS: VENOUS Normal compressibility of the common femoral, superficial femoral, and popliteal veins, as well as the visualized calf veins. Visualized portions of profunda femoral vein and great saphenous vein unremarkable. No filling defects to suggest DVT on grayscale or color Doppler imaging. Doppler waveforms show normal direction of venous flow, normal respiratory plasticity and response to augmentation. OTHER Cyst in the left popliteal fossa measuring up to 5 cm, likely Baker's cyst. Limitations: none IMPRESSION: 1. No lower extremity DVT. 2. 5 cm left Baker's cyst. Electronically Signed   By: Orvan Falconer M.D.   On: 08/26/2022 15:45   NM Pulmonary Perfusion  Result Date: 08/26/2022 CLINICAL DATA:  Pulmonary embolus suspected EXAM: NUCLEAR MEDICINE PERFUSION LUNG SCAN TECHNIQUE: Perfusion images were obtained in multiple projections after intravenous injection of radiopharmaceutical. Ventilation scans intentionally deferred if perfusion scan and chest x-ray adequate for interpretation during COVID 19 epidemic. RADIOPHARMACEUTICALS:  4.2 mCi Tc-69m MAA IV COMPARISON:  None Available. FINDINGS: Bilateral mild-to-moderate perfusion defects. No large perfusion defects. IMPRESSION: Nondiagnostic (intermediate probability). Correlate with lower extremity ultrasound and consider chest CTA for further evaluation. Electronically Signed   By: Allegra Lai M.D.   On: 08/26/2022 15:10   DG Chest Port 1 View  Result Date: 08/26/2022 CLINICAL DATA:  89 year old female with history of hematemesis and  generalized weakness. EXAM: PORTABLE CHEST 1 VIEW COMPARISON:  Chest x-ray 06/07/2022. FINDINGS: Lung volumes are low. Bibasilar opacities favored to reflect areas of subsegmental atelectasis. Diffuse interstitial prominence and widespread peribronchial cuffing. No definite pleural effusions. No pneumothorax. No evidence of pulmonary edema. Heart size is normal. Upper mediastinal contours are within normal limits allowing for patient positioning. Atherosclerotic calcifications in the thoracic aorta. IMPRESSION: 1. The appearance of the chest is concerning for probable acute bronchitis, as above. Some degree of underlying interstitial lung disease is also suspected. 2. Aortic atherosclerosis. Electronically Signed   By: Trudie Reed M.D.   On: 08/26/2022 05:51   CT Angio Chest/Abd/Pel for Dissection W and/or Wo Contrast  Result Date: 08/22/2022 CLINICAL DATA:  Vomiting and diarrhea. Concern for acute aortic syndrome. History of breast cancer. EXAM: CT ANGIOGRAPHY CHEST, ABDOMEN AND  PELVIS TECHNIQUE: Non-contrast CT of the chest was initially obtained. Multidetector CT imaging through the chest, abdomen and pelvis was performed using the standard protocol during bolus administration of intravenous contrast. Multiplanar reconstructed images and MIPs were obtained and reviewed to evaluate the vascular anatomy. RADIATION DOSE REDUCTION: This exam was performed according to the departmental dose-optimization program which includes automated exposure control, adjustment of the mA and/or kV according to patient size and/or use of iterative reconstruction technique. CONTRAST:  80mL OMNIPAQUE IOHEXOL 350 MG/ML SOLN COMPARISON:  CT chest dated June 08, 2022. CT abdomen pelvis dated March 18, 2022. FINDINGS: CTA CHEST FINDINGS Cardiovascular: Preferential opacification of the thoracic aorta. No evidence of thoracic aortic aneurysm or dissection. No intramural hematoma. Coronary, aortic arch, and branch vessel  atherosclerotic vascular disease. Normal heart size. No pericardial effusion. No pulmonary embolism. Mediastinum/Nodes: No enlarged mediastinal, hilar, or axillary lymph nodes. Thyroid gland, trachea, and esophagus demonstrate no significant findings. Lungs/Pleura: Bibasilar scarring. No focal consolidation, pleural effusion, or pneumothorax. Musculoskeletal: Status post bilateral mastectomies. No acute or significant osseous findings. Review of the MIP images confirms the above findings. CTA ABDOMEN AND PELVIS FINDINGS VASCULAR Aorta: Unchanged infrarenal abdominal aortic aneurysm measuring up to 3.6 cm in diameter. No dissection, vasculitis or significant stenosis. Calcified and noncalcified atherosclerotic plaque. 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 Appearance is suggestive of median arcuate ligament compression. 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 Renals: Single renal arteries bilaterally. Chronic severe stenosis of the distal left renal artery with severe left renal atrophy. Patent right proximal renal artery stent. IMA: Patent without evidence of aneurysm, dissection, vasculitis or significant stenosis. Inflow: Patent without evidence of aneurysm, dissection, vasculitis or significant stenosis. Veins: No obvious venous abnormality within the limitations of this arterial phase study. Review of the MIP images confirms the above findings. NON-VASCULAR Hepatobiliary: No focal liver abnormality is seen. No gallstones, gallbladder wall thickening, or biliary dilatation. Pancreas: No ductal dilatation or surrounding inflammatory changes. Multiple cystic pancreatic lesions measuring up to 2.6 cm, previously 2.5 cm. Spleen: Normal in size without focal abnormality. Adrenals/Urinary Tract: Adrenal glands are unremarkable. Chronic severe left renal atrophy. No renal calculi or hydronephrosis. Bladder is unremarkable.  Stomach/Bowel: Unchanged small hiatal hernia. No bowel wall thickening, distention, or surrounding inflammatory changes. History of prior appendectomy. Lymphatic: No enlarged abdominal or pelvic lymph nodes. Reproductive: Status post hysterectomy. No adnexal masses. Other: No free fluid or pneumoperitoneum. Musculoskeletal: No acute or significant osseous findings. Review of the MIP images confirms the above findings. IMPRESSION: 1. No evidence of acute aortic syndrome. 2. Unchanged infrarenal abdominal aortic aneurysm measuring up to 3.6 cm in diameter. Recommend follow-up ultrasound every 2 years. 3. 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 No evidence of bowel ischemia. 4. Multiple cystic pancreatic lesions measuring up to 2.6 cm. Given the patient's age, no routine follow-up is recommended. 5.  Aortic atherosclerosis (ICD10-I70.0). Electronically Signed   By: Obie Dredge M.D.   On: 08/22/2022 13:34   CT HEAD WO CONTRAST ( )  Result Date: 08/22/2022 CLINICAL DATA:  Acute onset of severe headache. EXAM: CT HEAD WITHOUT CONTRAST TECHNIQUE: Contiguous axial images were obtained from the base of the skull through the vertex without intravenous contrast. RADIATION DOSE REDUCTION: This exam was performed according to the departmental dose-optimization program which includes automated exposure control, adjustment of the mA and/or kV according to patient size and/or use of iterative reconstruction technique.  COMPARISON:  06/05/2022 FINDINGS: Brain: No evidence of intracranial hemorrhage, acute infarction, hydrocephalus, extra-axial collection, or mass lesion/mass effect. Mild cerebral atrophy and chronic small vessel disease again noted. Vascular:  No hyperdense vessel or other acute findings. Skull: No evidence of fracture or other significant bone abnormality. Sinuses/Orbits:  No acute findings. Other: None. IMPRESSION: No acute intracranial abnormality. Stable mild cerebral atrophy and  chronic small vessel disease. Electronically Signed   By: Danae Orleans M.D.   On: 08/22/2022 13:22   DG Bone Density  Result Date: 08/20/2022 EXAM: DUAL X-RAY ABSORPTIOMETRY (DXA) FOR BONE MINERAL DENSITY IMPRESSION: Your patient Demiana Crumbley completed a BMD test on 08/20/2022 using the [employer] DXA System (software version: 14.10) manufactured by [employer]. The following summarizes the results of our evaluation. Technologist: [facility] PATIENT BIOGRAPHICAL: Name: Jacie, Tristan Patient ID: [id number] Birth Date: 10/25/1926 Height: 64.0 in. Gender: Female Exam Date: 08/20/2022 Weight: 135.1 lbs. Indications: History of Breast Cancer, Advanced Age, Hysterectomy, Caucasian, History of Radiation, Oophorectomy Bilateral, Postmenopausal Fractures: Treatments: Anastrozole, Calcium, Prolia, Vitamin D DENSITOMETRY RESULTS: Site         Region     Measured Date Measured Age WHO Classification Young Adult T-score BMD         %Change vs. Previous Significant Change (*) DualFemur Neck Right 08/20/2022 93.2 Osteoporosis -2.7 0.669 g/cm2 2.3% - DualFemur Neck Right 03/07/2020 90.8 Osteoporosis -2.8 0.654 g/cm2 - - DualFemur Total Mean 08/20/2022 93.2 Osteoporosis -2.6 0.685 g/cm2 1.3% - DualFemur Total Mean 03/07/2020 90.8 Osteoporosis -2.6 0.676 g/cm2 - - Left Forearm Radius 33% 08/20/2022 93.2 Osteoporosis -3.1 0.604 g/cm2 10.2% Yes Left Forearm Radius 33% 03/07/2020 90.8 Osteoporosis -3.7 0.548 g/cm2 - - ASSESSMENT: The BMD measured at Forearm Radius 33% is 0.604 g/cm2 with a T-score of -3.1. This patient is considered osteoporotic according to World Health Organization [facility]) criteria. The scan quality is good. Lumbar spine was not utilized due to advanced degenerative changes. Compared with prior study, there has been no significant change in the total hip. World Science writer [facility]) criteria for post-menopausal, Caucasian Women: Normal:                   T-score at or above -1 SD Osteopenia/low bone mass:  T-score between -1 and -2.5 SD Osteoporosis:             T-score at or below -2.5 SD RECOMMENDATIONS: 1. All patients should optimize calcium and vitamin D intake. 2. Consider FDA-approved medical therapies in postmenopausal women and men aged 64 years and older, based on the following: a. A hip or vertebral(clinical or morphometric) fracture b. T-score < -2.5 at the femoral neck or spine after appropriate evaluation to exclude secondary causes c. Low bone mass (T-score between -1.0 and -2.5 at the femoral neck or spine) and a 10-year probability of a hip fracture > 3% or a 10-year probability of a major osteoporosis-related fracture > 20% based on the [place]-adapted WHO algorithm 3. Clinician judgment and/or patient preferences may indicate treatment for people with 10-year fracture probabilities above or below these levels FOLLOW-UP: People with diagnosed cases of osteoporosis or at high risk for fracture should have regular bone mineral density tests. For patients eligible for Medicare, routine testing is allowed once every 2 years. The testing frequency can be increased to one year for patients who have rapidly progressing disease, those who are receiving or discontinuing medical therapy to restore bone mass, or have additional risk factors. I have reviewed this report, and  agree with the above findings. [facility] Electronically Signed   By: Gerome Sam III M.D.   On: 08/20/2022 17:57    Assessment/Plan Tremors of nervous system  Polypharmacy  Hypocalcemia  Hypoalbuminemia Patient has labs pending - CMP and were unavailable at the time of evaluation. She has been on celexa and cymbalta for a long time, high suspicion for serotonin syndrome. Will plan to decrease celexa and continue cymbalta. Vital signs remain stable, unlikely to be due to infection again at this time. Given electrolyte abnormalities in the hospital, high suspicion for contribution at this time. Will address repletion  as applicable in the facility. Will collect repeat blood cultures just in case in addition to pending labs. Zyprexa 2.5 mg BID prn for tremors as we cannot completely discontinue all of her depression medications, however, this could help with some of the symptoms of hyperactive nervous system at this time.    Family/ staff Communication: nursing,   Labs/tests ordered: CBC, CMP, Blood cultures, magnesium, phosphorus.

## 2022-09-08 ENCOUNTER — Other Ambulatory Visit: Payer: Self-pay

## 2022-09-08 ENCOUNTER — Telehealth: Payer: Medicare PPO | Admitting: Student

## 2022-09-08 ENCOUNTER — Emergency Department
Admission: EM | Admit: 2022-09-08 | Discharge: 2022-09-08 | Disposition: A | Discharge status: Skilled Nursing Facility | Payer: Medicare PPO | Attending: Emergency Medicine | Admitting: Emergency Medicine

## 2022-09-08 DIAGNOSIS — I10 Essential (primary) hypertension: Secondary | ICD-10-CM | POA: Insufficient documentation

## 2022-09-08 DIAGNOSIS — R770 Abnormality of albumin: Secondary | ICD-10-CM | POA: Insufficient documentation

## 2022-09-08 DIAGNOSIS — E8809 Other disorders of plasma-protein metabolism, not elsewhere classified: Secondary | ICD-10-CM

## 2022-09-08 DIAGNOSIS — R251 Tremor, unspecified: Secondary | ICD-10-CM | POA: Diagnosis present

## 2022-09-08 LAB — BASIC METABOLIC PANEL
Anion gap: 7 (ref 5–15)
BUN: 19 mg/dL (ref 8–23)
CO2: 23 mmol/L (ref 22–32)
Calcium: 7.6 mg/dL — ABNORMAL LOW (ref 8.9–10.3)
Chloride: 101 mmol/L (ref 98–111)
Creatinine, Ser: 1.41 mg/dL — ABNORMAL HIGH (ref 0.44–1.00)
GFR, Estimated: 35 mL/min — ABNORMAL LOW (ref >60)
Glucose, Bld: 154 mg/dL — ABNORMAL HIGH (ref 70–99)
Potassium: 3.7 mmol/L (ref 3.5–5.1)
Sodium: 131 mmol/L — ABNORMAL LOW (ref 135–145)

## 2022-09-08 LAB — CBC
HCT: 29.5 % — ABNORMAL LOW (ref 36.0–46.0)
Hemoglobin: 9.5 g/dL — ABNORMAL LOW (ref 12.0–15.0)
MCH: 30.3 pg (ref 26.0–34.0)
MCHC: 32.2 g/dL (ref 30.0–36.0)
MCV: 93.9 fL (ref 80.0–100.0)
Platelets: 423 10*3/uL — ABNORMAL HIGH (ref 150–400)
RBC: 3.14 MIL/uL — ABNORMAL LOW (ref 3.87–5.11)
RDW: 14.3 % (ref 11.5–15.5)
WBC: 7.6 10*3/uL (ref 4.0–10.5)
nRBC: 0 % (ref 0.0–0.2)

## 2022-09-08 LAB — MAGNESIUM: Magnesium: 1.5 mg/dL — ABNORMAL LOW (ref 1.7–2.4)

## 2022-09-08 LAB — HEPATIC FUNCTION PANEL
ALT: 34 U/L (ref 0–44)
AST: 62 U/L — ABNORMAL HIGH (ref 15–41)
Albumin: 2.7 g/dL — ABNORMAL LOW (ref 3.5–5.0)
Alkaline Phosphatase: 111 U/L (ref 38–126)
Bilirubin, Direct: <0.1 mg/dL (ref 0.0–0.2)
Total Bilirubin: 0.6 mg/dL (ref 0.3–1.2)
Total Protein: 6.8 g/dL (ref 6.5–8.1)

## 2022-09-08 MED ORDER — HYDRALAZINE HCL 50 MG PO TABS
25.0000 mg | ORAL_TABLET | Freq: Once | ORAL | Status: AC
  Administered 2022-09-08: 25 mg via ORAL
  Filled 2022-09-08: qty 1

## 2022-09-08 MED ORDER — SODIUM CHLORIDE 0.9 % IV SOLN: 1.0000 g | Freq: Once | INTRAVENOUS | Status: DC

## 2022-09-08 MED ORDER — CALCIUM GLUCONATE-NACL 1-0.675 GM/50ML-% IV SOLN
1.0000 g | Freq: Once | INTRAVENOUS | Status: AC
  Administered 2022-09-08: 1000 mg via INTRAVENOUS
  Filled 2022-09-08: qty 50

## 2022-09-08 MED ORDER — METOPROLOL SUCCINATE ER 50 MG PO TB24
25.0000 mg | ORAL_TABLET | Freq: Every day | ORAL | Status: DC
  Administered 2022-09-08: 25 mg via ORAL
  Filled 2022-09-08: qty 1

## 2022-09-08 NOTE — ED Notes (Signed)
Green, lav, SST and red top all sent to lab

## 2022-09-08 NOTE — Discharge Instructions (Addendum)
The patient was given calcium 1 g IV, hydralazine 25 mg and her metoprolol 24-hour medication.  She has not had her other medications today.  Please advise nursing staff and have them give her her regular meds

## 2022-09-08 NOTE — ED Provider Notes (Signed)
[facility] Provider Note    Event Date/Time   First MD Initiated Contact with Patient 09/08/22 1150     (approximate)   History   abnormal labs   HPI  Tina Curtis is a 89 y.o. female with history of hypertension, hypercholesterolemia, arthritis, neuropathy, recent sepsis and has a PICC line presents emergency department with low calcium.  Patient has a history of low calcium and low albumin.  Was sent here by [facility] for calcium infusion.  Patient has been having tremors they are worse in the mornings and dissipates some in the afternoons.  Being followed by Dr. Sydnee Curtis at [facility]  Patient denies chest pain/shortness of breath.  Staying at the skilled nursing facility at [facility].  Family is here with patient.      Physical Exam   Triage Vital Signs: ED Triage Vitals  Enc Vitals Group     BP 09/08/22 0912 (!) 180/86     Pulse Rate 09/08/22 0912 74     Resp 09/08/22 0912 18     Temp 09/08/22 0912 98 F (36.7 C)     Temp src --      SpO2 09/08/22 0912 97 %     Weight --      Height --      Head Circumference --      Peak Flow --      Pain Score 09/08/22 0911 4     Pain Loc --      Pain Edu? --      Excl. in GC? --     Most recent vital signs: Vitals:   09/08/22 1252 09/08/22 1416  BP: (!) 201/93 (!) 186/91  Pulse: 74   Resp: 20   Temp:    SpO2: 99%      General: Awake, no distress.   CV:  Good peripheral perfusion. regular rate and  rhythm Resp:  Normal effort. Lungs cta Abd:  No distention.   Other:     ED Results / Procedures / Treatments   Labs (all labs ordered are listed, but only abnormal results are displayed) Labs Reviewed  CBC - Abnormal; Notable for the following components:      Result Value   RBC 3.14 (*)    Hemoglobin 9.5 (*)    HCT 29.5 (*)    Platelets 423 (*)    All other components within normal limits  BASIC METABOLIC PANEL - Abnormal; Notable for the following components:   Sodium 131 (*)     Glucose, Bld 154 (*)    Creatinine, Ser 1.41 (*)    Calcium 7.6 (*)    GFR, Estimated 35 (*)    All other components within normal limits  HEPATIC FUNCTION PANEL - Abnormal; Notable for the following components:   Albumin 2.7 (*)    AST 62 (*)    All other components within normal limits  MAGNESIUM - Abnormal; Notable for the following components:   Magnesium 1.5 (*)    All other components within normal limits  PARATHYROID HORMONE, INTACT (NO CA)     EKG  EKG   RADIOLOGY     PROCEDURES:   Procedures   MEDICATIONS ORDERED IN ED: Medications  metoprolol succinate (TOPROL-XL) 24 hr tablet 25 mg (25 mg Oral Given 09/08/22 1347)  calcium gluconate 1 g/ 50 mL sodium chloride IVPB (0 mg Intravenous Stopped 09/08/22 1342)  hydrALAZINE (APRESOLINE) tablet 25 mg (25 mg Oral Given 09/08/22 1347)  IMPRESSION / MDM / ASSESSMENT AND PLAN / ED COURSE  I reviewed the triage vital signs and the nursing notes.                              Differential diagnosis includes, but is not limited to, hyperparathyroidism, calcium depletion, electrolyte abnormality,  Patient's presentation is most consistent with acute presentation with potential threat to life or bodily function.   In review of the patient's past medical charts I did see notes from Dr. Sydnee Curtis stating that she would need a calcium infusion of 2 g.  He had also ordered labs that the family members stating were not done at [facility].  Patient's labs are indicating low albumin, low magnesium and low calcium, I did add a PTH level to assess for hyperparathyroidism.  This may not be back prior to the patient's discharge.  With the calcium correction with albumin we feel that 1 g of calcium infusion would be adequate's time, I did discuss this with Dr. Derrill Kay  In shared decision-making with the patient, her daughter, and myself we did discuss possible admission versus just getting the infusion.  States she is staying at a  skilled nursing facility and they do have capability to monitor her closely she would like to return to the facility and not stay in the hospital.  Since the patient had a recent severe infection I do feel this might be safer for her at this time.  We did consider admission.  I do feel that since she is going to [facility] skilled nursing facility they can monitor her closely.  Will discharge her after the calcium infusion.  Explained to the daughter that the PTH level may not be back today as this may be a send out for the emergency department.  Her doctor can look at her labs and evaluate this tomorrow.  Blood pressure is elevated patient has not had any of her medications this morning.  Did order the metoprolol and hydralazine while she is here in the ED.  Will monitor her for about 15 to 20 minutes to make sure the blood pressure is decreasing and then will discharge.  Patient's blood pressure did decrease.  Will send her via private vehicle with her daughter back to [facility].  They are in agreement treatment plan.     FINAL CLINICAL IMPRESSION(S) / ED DIAGNOSES   Final diagnoses:  Hypocalcemia  Hypoalbuminemia  Hypomagnesemia     Rx / DC Orders   ED Discharge Orders     None        Note:  This document was prepared using Dragon voice recognition software and may include unintentional dictation errors.    Faythe Ghee, PA-C 09/08/22 1438    Phineas Semen, MD 09/08/22 [phone]

## 2022-09-08 NOTE — Telephone Encounter (Signed)
Received patient's labs from yesterday which were notable for calcium of 6.4. Corrected for hypoalbuminemia (Albumin 2.7) which is corrected to 7.4. Discussed with nursing concern that the symptoms she is having could be due to low calcium. Requested IV calcium repletion with 2g calcium gluconate, vitamin d 50k IU weekly, and 1250 Calcium QID. If we are unable to get adequate calcium supplementation, patient may need to transfer to the hospital for continued electrolyte abnormalities.   Ordered stat labs- PTH, Calcium, Magnesium, phosphorus, ECG. Discussed concerns with nursing staff and pharmacy.

## 2022-09-08 NOTE — ED Triage Notes (Signed)
First RN note- Pt BIB [facility] for  abnormal labs, low calcium resulting in tremors. Pt is from [place].   EMS Vitals: 193/98 99% RA 80 HR

## 2022-09-08 NOTE — ED Triage Notes (Signed)
Pt comes with c/o low calcium. Pt is from [place]. Pt states tremors for about 2 weeks. Pt states some back pain as well.

## 2022-09-09 LAB — CBC AND DIFFERENTIAL
HCT: 32 — AB (ref 36–46)
Hemoglobin: 10.4 — AB (ref 12.0–16.0)
Neutrophils Absolute: 4343
Platelets: 460 10*3/uL — AB (ref 150–400)
WBC: 7.5

## 2022-09-09 LAB — HEPATIC FUNCTION PANEL
ALT: 34 U/L (ref 7–35)
AST: 59 — AB (ref 13–35)
Bilirubin, Total: 0.3

## 2022-09-09 LAB — PARATHYROID HORMONE, INTACT (NO CA): PTH: 191 pg/mL — ABNORMAL HIGH (ref 15–65)

## 2022-09-09 LAB — BASIC METABOLIC PANEL
BUN: 15 (ref 4–21)
CO2: 22 (ref 13–22)
Chloride: 102 (ref 99–108)
Creatinine: 1.3 — AB (ref 0.5–1.1)
Glucose: 107
Potassium: 4.5 mEq/L (ref 3.5–5.1)
Sodium: 131 — AB (ref 137–147)

## 2022-09-09 LAB — COMPREHENSIVE METABOLIC PANEL
Albumin: 3.2 — AB (ref 3.5–5.0)
Calcium: 8.1 — AB (ref 8.7–10.7)
Globulin: 4
eGFR: 39

## 2022-09-09 LAB — CBC: RBC: 3.42 — AB (ref 3.87–5.11)

## 2022-09-10 ENCOUNTER — Encounter: Payer: Self-pay | Admitting: Nurse Practitioner

## 2022-09-10 ENCOUNTER — Non-Acute Institutional Stay (SKILLED_NURSING_FACILITY): Payer: Medicare PPO | Admitting: Nurse Practitioner

## 2022-09-10 DIAGNOSIS — N2581 Secondary hyperparathyroidism of renal origin: Secondary | ICD-10-CM | POA: Diagnosis not present

## 2022-09-10 NOTE — Progress Notes (Deleted)
Location:  [facility]  [facility]ing Home Room Number: [facility] 119A Place of Service:  SNF (31) [facility]  PCP: [name], MD  Patient Care Team: [name], MD as PCP - General (Family Medicine) Earna Coder, MD as Consulting Physician (Internal Medicine) Lemar Livings Merrily Pew, MD as Consulting Physician (General Surgery) Scarlett Presto, RN (Inactive) as Oncology Nurse Navigator Carmina Miller, MD as Referring Physician (Radiation Oncology)  Extended Emergency Contact Information Primary Emergency Contact: [facility] Phone: [phone] Relation: Daughter Secondary Emergency Contact: [place] Mobile Phone: [phone] Relation: Daughter  Goals of care: Advanced Directive information    09/10/2022    4:17 PM  Advanced Directives  Does Patient Have a Medical Advance Directive? Yes  Type of Advance Directive Out of facility DNR (pink MOST or yellow form)  Does patient want to make changes to medical advance directive? No - Patient declined     Chief Complaint  Patient presents with   Acute Visit    Abnormal Labs.     HPI:  Pt is a 89 y.o. female seen today for an acute visit for Abnormal Labs.    Past Medical History:  Diagnosis Date   Arthritis    Gout   Chronic kidney disease    Diarrhea    Hypercholesteremia    Hypertension    Neuropathy    feet and lower legs   Seasonal allergies    Skin cancer    face   Past Surgical History:  Procedure Laterality Date   ABDOMINAL HYSTERECTOMY  2000   APPENDECTOMY     BREAST BIOPSY Right 09/19/2019   affirm bx of calcs UOQ, x marker, path pending   BREAST BIOPSY Right 09/19/2019   [place] bx of mass,heart marker, path pending   BREAST BIOPSY Right 09/19/2019   [place] bx of LN, coil marker, path pending   CATARACT EXTRACTION Left    CATARACT EXTRACTION W/PHACO Right 10/24/2014   Procedure: CATARACT EXTRACTION PHACO AND INTRAOCULAR LENS PLACEMENT (IOC);  Surgeon: Lockie Mola, MD;  Location: [facility];  Service: Ophthalmology;  Laterality: Right;   ESOPHAGOGASTRODUODENOSCOPY N/A 03/07/2018   Procedure: ESOPHAGOGASTRODUODENOSCOPY (EGD);  Surgeon: Toney Reil, MD;  Location: [facility];  Service: Gastroenterology;  Laterality: N/A;   MASTECTOMY MODIFIED RADICAL Right 10/13/2019   Procedure: MASTECTOMY MODIFIED RADICAL;  Surgeon: Earline Mayotte, MD;  Location: [facility] ORS;  Service: General;  Laterality: Right;   RENAL ANGIOGRAPHY Right 04/11/2018   Procedure: RENAL ANGIOGRAPHY;  Surgeon: Annice Needy, MD;  Location: [facility] INVASIVE CV LAB;  Service: Cardiovascular;  Laterality: Right;   SIMPLE MASTECTOMY WITH AXILLARY SENTINEL NODE BIOPSY Left 10/13/2019   Procedure: SIMPLE MASTECTOMY TRUE CUT BIOPSY, SENTINEL NODE BIOPSY;  Surgeon: Earline Mayotte, MD;  Location: [facility] ORS;  Service: General;  Laterality: Left;    Allergies  Allergen Reactions   Penicillins Anaphylaxis   Drug Ingredient [Zinc]     Outpatient Encounter Medications as of 09/10/2022  Medication Sig   albuterol (VENTOLIN HFA) 108 (90 Base) MCG/ACT inhaler Inhale 2 puffs into the lungs every 6 (six) hours as needed for wheezing or shortness of breath.   anastrozole (ARIMIDEX) 1 MG tablet TAKE 1 TABLET(1 MG) BY MOUTH DAILY. DO NOT. START IF RASH IS NOT BETTER   aspirin 81 MG EC tablet Take 81 mg by mouth daily.   calcium carbonate (OS-CAL - DOSED IN MG OF ELEMENTAL CALCIUM) 1250 (500 Ca) MG tablet Take 1 tablet by mouth 4 (four) times daily.  cefTRIAXone (ROCEPHIN) 1-3.74 GM-%(50ML) IVPB 2 grams intravenously once daily.   citalopram (CELEXA) 10 MG tablet Take 10 mg by mouth daily.    clopidogrel (PLAVIX) 75 MG tablet TAKE 1 TABLET BY MOUTH EVERY DAY   cyanocobalamin (VITAMIN B12) 1000 MCG tablet Take 1,000 mcg by mouth daily.   docusate sodium (COLACE) 100 MG capsule Take 100 mg by mouth daily as needed for mild constipation.   DULoxetine (CYMBALTA) 60 MG capsule Take 60  mg by mouth daily.    furosemide (LASIX) 40 MG tablet Take 1 tablet (40 mg total) by mouth daily.   gabapentin (NEURONTIN) 300 MG capsule Take 300 mg by mouth at bedtime.   heparin flush 10 UNIT/ML injection Inject into the vein. 3 mL one time daily for PICC line patency for 7 days   hydrALAZINE (APRESOLINE) 25 MG tablet Take 25 mg by mouth 3 (three) times daily.   isosorbide mononitrate (IMDUR) 30 MG 24 hr tablet Take 1 tablet (30 mg total) by mouth daily.   lidocaine (LIDODERM) 5 % Place 1 patch onto the skin daily. Remove & Discard patch within 12 hours or as directed by MD   loratadine (CLARITIN) 10 MG tablet Take 10 mg by mouth daily as needed for allergies. AM   metoprolol succinate (TOPROL-XL) 25 MG 24 hr tablet Take 37.5 mg by mouth at bedtime.    Multiple Vitamins-Minerals (ICAPS AREDS 2 PO) Take 2 capsules by mouth daily.   multivitamin (PROSIGHT) TABS tablet Take 1 tablet by mouth daily.   pantoprazole (PROTONIX) 40 MG tablet Take 1 tablet (40 mg total) by mouth daily.   simvastatin (ZOCOR) 20 MG tablet Take 20 mg by mouth every evening. PM   traMADol (ULTRAM) 50 MG tablet Take 1 tablet (50 mg total) by mouth every 12 (twelve) hours as needed for moderate pain.   triamcinolone cream (KENALOG) 0.1 % Apply 1 Application topically 2 (two) times daily.   Vitamin D, Ergocalciferol, (DRISDOL) 1.25 MG (50000 UNIT) CAPS capsule Take 50,000 Units by mouth every 7 (seven) days.   Zinc Oxide (TRIPLE PASTE) 12.8 % ointment Apply 1 Application topically as directed. Apply to buttocks every shift for skin protection   [DISCONTINUED] allopurinol (ZYLOPRIM) 100 MG tablet Take 100 mg by mouth daily.   [DISCONTINUED] calcium carbonate (OS-CAL - DOSED IN MG OF ELEMENTAL CALCIUM) 1250 (500 Ca) MG tablet Take 2 tablets (2,500 mg total) by mouth 2 (two) times daily with a meal. (Patient taking differently: Take 2,500 mg by mouth in the morning, at noon, in the evening, and at bedtime.)   [DISCONTINUED]  empagliflozin (JARDIANCE) 10 MG TABS tablet Take 10 mg by mouth daily.   [DISCONTINUED] Ergocalciferol 50 MCG (2000 UT) TABS Take 2,000 Units by mouth daily.   No facility-administered encounter medications on file as of 09/10/2022.    Review of Systems***  Immunization History  Administered Date(s) Administered   Influenza Inj Mdck Quad Pf 03/26/2016, 02/14/2018, 02/15/2019, 02/25/2021, 03/02/2022   Influenza Split 02/20/2015   Influenza, Seasonal, Injecte, Preservative Fre 04/13/2012, 03/17/2013   Influenza,inj,Quad PF,6+ Mos 03/07/2014, 02/20/2020   Influenza-Unspecified 03/26/2016, 02/04/2017, 02/14/2018, 02/25/2021   Moderna Sars-Covid-2 Vaccination 05/24/2019, 06/24/2019, 02/22/2020, 02/26/2022   Pneumococcal Conjugate-13 09/01/2013   Pneumococcal Polysaccharide-23 04/13/2012   Td 08/07/2010   Tdap 08/07/2010   Zoster, Live 09/12/2007   Pertinent  Health Maintenance Due  Topic Date Due   FOOT EXAM  Never done   OPHTHALMOLOGY EXAM  Never done   INFLUENZA VACCINE  12/10/2022  HEMOGLOBIN A1C  02/25/2023   DEXA SCAN  Completed      01/09/2022   10:55 AM 02/18/2022    1:36 PM 03/18/2022    4:19 PM 03/18/2022    4:46 PM 05/13/2022   10:17 AM  Fall Risk  (RETIRED) Patient Fall Risk Level High fall risk High fall risk Moderate fall risk Moderate fall risk High fall risk   Functional Status Survey:    Vitals:   09/10/22 1602 09/10/22 1646  BP: (!) 172/74 (!) 147/63  Pulse: 71   Resp: 18   Temp: 98.1 F (36.7 C)   SpO2: 96%   Weight: 133 lb 12.8 oz (60.7 kg)   Height: 5\' 4"  (1.626 m)    Body mass index is 22.97 kg/m. Physical Exam***  Labs reviewed: Recent Labs    08/30/22 0437 08/30/22 1751 08/31/22 0526 09/01/22 0529 09/02/22 0745 09/03/22 0000 09/08/22 0913 09/09/22 0000  NA 134*  --  134* 133* 136 133* 131* 131*  K 3.0* 3.4* 3.6 3.2* 3.7 3.8 3.7 4.5  CL 105  --  106 104 108 100 101 102  CO2 19*  --  20* 21* 21* 24* 23 22  GLUCOSE 102*  --  111* 113*  110*  --  154*  --   BUN 28*  --  21 18 21 18 19 15   CREATININE 1.76* 1.78* 1.55* 1.55* 1.64* 1.7* 1.41* 1.3*  CALCIUM 6.8*  --  7.2* 7.0* 6.6* 6.4* 7.6* 8.1*  MG 2.0  --  1.9  --   --   --  1.5*  --   PHOS 1.7* 2.2* 1.5* 1.9*  --   --   --   --    Recent Labs    08/22/22 1110 08/26/22 0526 08/28/22 0214 09/03/22 0000 09/08/22 0913 09/09/22 0000  AST 50* 56*  --  46* 62* 59*  ALT 22 35  --  24 34 34  ALKPHOS 93 72  --  91 111  --   BILITOT 0.6 1.1  --   --  0.6  --   PROT 7.7 7.4  --   --  6.8  --   ALBUMIN 4.0 3.9   < > 2.7* 2.7* 3.2*   < > = values in this interval not displayed.   Recent Labs    08/26/22 0526 08/26/22 0754 08/28/22 0214 08/29/22 0523 09/03/22 0000 09/08/22 0913 09/09/22 0000  WBC 6.4   < > 14.1* 13.1* 11.6 7.6 7.5  NEUTROABS 5.3  --   --   --  8,352.00  --  4,343.00  HGB 11.9*   < > 10.0* 10.1* 8.9* 9.5* 10.4*  HCT 35.1*   < > 29.0* 29.7* 27* 29.5* 32*  MCV 92.1   < > 90.1 90.3  --  93.9  --   PLT 176   < > 165 194 322 423* 460*   < > = values in this interval not displayed.   Lab Results  Component Value Date   TSH 2.573 08/26/2020   Lab Results  Component Value Date   HGBA1C 7.0 (H) 08/26/2022   Lab Results  Component Value Date   CHOL 75 08/27/2022   HDL 22 (L) 08/27/2022   LDLCALC 34 08/27/2022   TRIG 97 08/27/2022   CHOLHDL 3.4 08/27/2022    Significant Diagnostic Results in last 30 days:  MR Lumbar Spine W Wo Contrast  Result Date: 09/01/2022 CLINICAL DATA:  Initial evaluation for back pain. EXAM: MRI LUMBAR SPINE  WITHOUT AND WITH CONTRAST TECHNIQUE: Multiplanar and multiecho pulse sequences of the lumbar spine were obtained without and with intravenous contrast. CONTRAST:  6mL GADAVIST GADOBUTROL 1 MMOL/ML IV SOLN COMPARISON:  Prior study from 08/22/2022. FINDINGS: Segmentation: Standard. Lowest well-formed disc space labeled the L5-S1 level. Alignment: Sigmoid scoliotic curvature of the lumbar spine. 3 mm anterolisthesis of L4 on  L5, with 5 mm anterolisthesis of L5 on S1. Findings chronic and facet mediated. Vertebrae: Vertebral body height maintained without acute or chronic fracture. Bone marrow signal intensity within normal limits. No discrete or worrisome osseous lesions. Reactive marrow edema and enhancement present about the left L4-5 facet, most likely due to degenerative facet arthritis. No other abnormal marrow edema or enhancement. Conus medullaris and cauda equina: Conus extends to the L2 level. Conus and cauda equina appear normal. Paraspinal and other soft tissues: Prominent edema and enhancement seen within the left lower posterior paraspinous soft tissues adjacent to the left L4-5 facet (series 10, image 12). Finding is most likely due to adjacent facet arthritis. No drainable fluid collections. Paraspinous soft tissues demonstrate no other acute finding. Aortic atherosclerosis with 3.6 cm infrarenal aortic aneurysm. Few scattered subcentimeter T2 hyperintense cyst noted about the visualized right kidney, likely benign, with no follow-up imaging recommended. Asymmetric left renal atrophy noted. 1 cm benign appearing cyst noted within the spleen. Disc levels: T12-L1: Disc desiccation with mild disc bulge and endplate spurring. Mild facet hypertrophy. No stenosis. L1-2: Negative interspace. Mild facet hypertrophy. No canal or foraminal stenosis. L2-3: Degenerative intervertebral disc space narrowing with diffuse disc bulge, slightly asymmetric to the right. Mild bilateral facet hypertrophy. No spinal stenosis. Foramina remain adequately patent. L3-4: Degenerative intervertebral disc space narrowing with diffuse disc bulge and disc desiccation. Associated reactive endplate change with marginal endplate osteophytic spurring. Moderate left with mild right facet arthrosis. Resultant mild canal with left lateral recess stenosis. Mild left L3 foraminal narrowing. Right neural foramina remains patent. L4-5: Disc desiccation with  diffuse disc bulge. Superimposed small central disc protrusion with slight inferior migration. Moderate left greater than right facet arthrosis with associated small joint effusions. Reactive marrow edema and enhancement on the left. Resultant mild canal with bilateral subarticular stenosis. Mild bilateral L4 foraminal narrowing. L5-S1: Anterolisthesis. Diffuse disc bulge with disc desiccation. Severe right with moderate left facet arthrosis. Resultant mild narrowing of the right lateral recess. Central canal remains patent. Mild to moderate right L5 foraminal stenosis. Left neural foramina remains patent. IMPRESSION: 1. Reactive edema and enhancement about the left L4-5 facet, most likely due to degenerative facet arthritis. Although possible infection with septic arthritis is felt to be unlikely, correlation with history and laboratory values is recommended. 2. No other acute abnormality within the lumbar spine. 3. Multifactorial degenerative changes at L3-4 through L5-S1 with resultant mild canal with bilateral lateral recess stenosis as above. 4. 3.6 cm infrarenal aortic aneurysm. Recommend follow-up ultrasound every 2 years. This recommendation follows ACR consensus guidelines: White Paper of the ACR Incidental Findings Committee II on Vascular Findings. J Am Coll Radiol 2013; 10:789-794. Aortic Atherosclerosis (ICD10-I70.0). Electronically Signed   By: Rise Mu M.D.   On: 09/01/2022 05:23   ECHOCARDIOGRAM COMPLETE  Result Date: 08/27/2022    ECHOCARDIOGRAM REPORT   Patient Name:   LONNA RABOLD Date of Exam: 08/27/2022 Medical Rec #:  [id number]     Height:       64.0 in Accession #:    [id number]    Weight:       132.3 lb  Date of Birth:  20-Sep-1926    BSA:          1.641 m Patient Age:    89 years      BP:           136/66 mmHg Patient Gender: F             HR:           67 bpm. Exam Location:  [facility] Procedure: 2D Echo, Cardiac Doppler and Color Doppler Indications:     Elevated Troponin   History:         Patient has no prior history of Echocardiogram examinations.                  CHF, Acute MI, Signs/Symptoms:Chest Pain,                  Dizziness/Lightheadedness and Fatigue; Risk                  Factors:Hypertension and Diabetes. Breast CA.  Sonographer:     Mikki Harbor Referring Phys:  [id number] [facility] MICHELLE TANG Diagnosing Phys: Marcina Millard MD IMPRESSIONS  1. Left ventricular ejection fraction, by estimation, is 60 to 65%. The left ventricle has normal function. The left ventricle has no regional wall motion abnormalities. Left ventricular diastolic parameters are consistent with Grade I diastolic dysfunction (impaired relaxation).  2. Right ventricular systolic function is normal. The right ventricular size is normal. There is moderately elevated pulmonary artery systolic pressure.  3. Left atrial size was mild to moderately dilated.  4. Right atrial size was mild to moderately dilated.  5. The mitral valve is normal in structure. Moderate to severe mitral valve regurgitation. No evidence of mitral stenosis.  6. Tricuspid valve regurgitation is moderate to severe.  7. The aortic valve is normal in structure. Aortic valve regurgitation is mild. No aortic stenosis is present.  8. The inferior vena cava is normal in size with greater than 50% respiratory variability, suggesting right atrial pressure of 3 mmHg. FINDINGS  Left Ventricle: Left ventricular ejection fraction, by estimation, is 60 to 65%. The left ventricle has normal function. The left ventricle has no regional wall motion abnormalities. The left ventricular internal cavity size was normal in size. There is  no left ventricular hypertrophy. Left ventricular diastolic parameters are consistent with Grade I diastolic dysfunction (impaired relaxation). Right Ventricle: The right ventricular size is normal. No increase in right ventricular wall thickness. Right ventricular systolic function is normal. There is moderately  elevated pulmonary artery systolic pressure. The tricuspid regurgitant velocity is 3.60 m/s, and with an assumed right atrial pressure of 8 mmHg, the estimated right ventricular systolic pressure is 59.8 mmHg. Left Atrium: Left atrial size was mild to moderately dilated. Right Atrium: Right atrial size was mild to moderately dilated. Pericardium: There is no evidence of pericardial effusion. Mitral Valve: The mitral valve is normal in structure. Moderate to severe mitral valve regurgitation. No evidence of mitral valve stenosis. MV peak gradient, 4.8 mmHg. The mean mitral valve gradient is 1.0 mmHg. Tricuspid Valve: The tricuspid valve is normal in structure. Tricuspid valve regurgitation is moderate to severe. No evidence of tricuspid stenosis. Aortic Valve: The aortic valve is normal in structure. Aortic valve regurgitation is mild. No aortic stenosis is present. Aortic valve mean gradient measures 4.0 mmHg. Aortic valve peak gradient measures 10.1 mmHg. Aortic valve area, by VTI measures 2.75  cm. Pulmonic Valve: The pulmonic valve was normal in structure. Pulmonic  valve regurgitation is not visualized. No evidence of pulmonic stenosis. Aorta: The aortic root is normal in size and structure. Venous: The inferior vena cava is normal in size with greater than 50% respiratory variability, suggesting right atrial pressure of 3 mmHg. IAS/Shunts: No atrial level shunt detected by color flow Doppler.  LEFT VENTRICLE PLAX 2D LVIDd:         4.50 cm   Diastology LVIDs:         2.80 cm   LV e' medial:    6.42 cm/s LV PW:         1.00 cm   LV E/e' medial:  17.0 LV IVS:        1.30 cm   LV e' lateral:   8.70 cm/s LVOT diam:     2.10 cm   LV E/e' lateral: 12.5 LV SV:         86 LV SV Index:   52 LVOT Area:     3.46 cm  RIGHT VENTRICLE RV Basal diam:  3.65 cm RV Mid diam:    2.70 cm RV S prime:     8.92 cm/s TAPSE (M-mode): 2.0 cm LEFT ATRIUM              Index        RIGHT ATRIUM           Index LA diam:        4.10 cm  2.50  cm/m   RA Area:     19.20 cm LA Vol (A2C):   115.0 ml 70.07 ml/m  RA Volume:   50.40 ml  30.71 ml/m LA Vol (A4C):   61.7 ml  37.60 ml/m LA Biplane Vol: 85.5 ml  52.10 ml/m  AORTIC VALVE                    PULMONIC VALVE AV Area (Vmax):    2.03 cm     PV Vmax:       0.90 m/s AV Area (Vmean):   2.40 cm     PV Peak grad:  3.2 mmHg AV Area (VTI):     2.75 cm AV Vmax:           159.00 cm/s AV Vmean:          91.500 cm/s AV VTI:            0.311 m AV Peak Grad:      10.1 mmHg AV Mean Grad:      4.0 mmHg LVOT Vmax:         93.40 cm/s LVOT Vmean:        63.300 cm/s LVOT VTI:          0.247 m LVOT/AV VTI ratio: 0.79  AORTA Ao Root diam: 3.30 cm Ao Asc diam:  4.10 cm MITRAL VALVE                TRICUSPID VALVE MV Area (PHT): 4.65 cm     TR Peak grad:   51.8 mmHg MV Area VTI:   2.77 cm     TR Vmax:        360.00 cm/s MV Peak grad:  4.8 mmHg MV Mean grad:  1.0 mmHg     SHUNTS MV Vmax:       1.10 m/s     Systemic VTI:  0.25 m MV Vmean:      54.4 cm/s    Systemic Diam: 2.10 cm MV Decel Time: 163 msec MV  E velocity: 109.00 cm/s MV A velocity: 77.00 cm/s MV E/A ratio:  1.42 Marcina Millard MD Electronically signed by Marcina Millard MD Signature Date/Time: 08/27/2022/1:16:52 PM    Final    US Venous Img Lower Bilateral (DVT)  Result Date: 08/26/2022 CLINICAL DATA:  220372 Positive D dimer 220372. EXAM: BILATERAL LOWER EXTREMITY VENOUS DOPPLER ULTRASOUND TECHNIQUE: Gray-scale sonography with compression, as well as color and duplex ultrasound, were performed to evaluate the deep venous system(s) from the level of the common femoral vein through the popliteal and proximal calf veins. COMPARISON:  None Available. FINDINGS: VENOUS Normal compressibility of the common femoral, superficial femoral, and popliteal veins, as well as the visualized calf veins. Visualized portions of profunda femoral vein and great saphenous vein unremarkable. No filling defects to suggest DVT on grayscale or color Doppler imaging.  Doppler waveforms show normal direction of venous flow, normal respiratory plasticity and response to augmentation. OTHER Cyst in the left popliteal fossa measuring up to 5 cm, likely Baker's cyst. Limitations: none IMPRESSION: 1. No lower extremity DVT. 2. 5 cm left Baker's cyst. Electronically Signed   By: Orvan Falconer M.D.   On: 08/26/2022 15:45   NM Pulmonary Perfusion  Result Date: 08/26/2022 CLINICAL DATA:  Pulmonary embolus suspected EXAM: NUCLEAR MEDICINE PERFUSION LUNG SCAN TECHNIQUE: Perfusion images were obtained in multiple projections after intravenous injection of radiopharmaceutical. Ventilation scans intentionally deferred if perfusion scan and chest x-ray adequate for interpretation during COVID 19 epidemic. RADIOPHARMACEUTICALS:  4.2 mCi Tc-67m MAA IV COMPARISON:  None Available. FINDINGS: Bilateral mild-to-moderate perfusion defects. No large perfusion defects. IMPRESSION: Nondiagnostic (intermediate probability). Correlate with lower extremity ultrasound and consider chest CTA for further evaluation. Electronically Signed   By: Allegra Lai M.D.   On: 08/26/2022 15:10   DG Chest Port 1 View  Result Date: 08/26/2022 CLINICAL DATA:  89 year old female with history of hematemesis and generalized weakness. EXAM: PORTABLE CHEST 1 VIEW COMPARISON:  Chest x-ray 06/07/2022. FINDINGS: Lung volumes are low. Bibasilar opacities favored to reflect areas of subsegmental atelectasis. Diffuse interstitial prominence and widespread peribronchial cuffing. No definite pleural effusions. No pneumothorax. No evidence of pulmonary edema. Heart size is normal. Upper mediastinal contours are within normal limits allowing for patient positioning. Atherosclerotic calcifications in the thoracic aorta. IMPRESSION: 1. The appearance of the chest is concerning for probable acute bronchitis, as above. Some degree of underlying interstitial lung disease is also suspected. 2. Aortic atherosclerosis.  Electronically Signed   By: Trudie Reed M.D.   On: 08/26/2022 05:51   CT Angio Chest/Abd/Pel for Dissection W and/or Wo Contrast  Result Date: 08/22/2022 CLINICAL DATA:  Vomiting and diarrhea. Concern for acute aortic syndrome. History of breast cancer. EXAM: CT ANGIOGRAPHY CHEST, ABDOMEN AND PELVIS TECHNIQUE: Non-contrast CT of the chest was initially obtained. Multidetector CT imaging through the chest, abdomen and pelvis was performed using the standard protocol during bolus administration of intravenous contrast. Multiplanar reconstructed images and MIPs were obtained and reviewed to evaluate the vascular anatomy. RADIATION DOSE REDUCTION: This exam was performed according to the departmental dose-optimization program which includes automated exposure control, adjustment of the mA and/or kV according to patient size and/or use of iterative reconstruction technique. CONTRAST:  80mL OMNIPAQUE IOHEXOL 350 MG/ML SOLN COMPARISON:  CT chest dated June 08, 2022. CT abdomen pelvis dated March 18, 2022. FINDINGS: CTA CHEST FINDINGS Cardiovascular: Preferential opacification of the thoracic aorta. No evidence of thoracic aortic aneurysm or dissection. No intramural hematoma. Coronary, aortic arch, and branch vessel atherosclerotic vascular disease. Normal  heart size. No pericardial effusion. No pulmonary embolism. Mediastinum/Nodes: No enlarged mediastinal, hilar, or axillary lymph nodes. Thyroid gland, trachea, and esophagus demonstrate no significant findings. Lungs/Pleura: Bibasilar scarring. No focal consolidation, pleural effusion, or pneumothorax. Musculoskeletal: Status post bilateral mastectomies. No acute or significant osseous findings. Review of the MIP images confirms the above findings. CTA ABDOMEN AND PELVIS FINDINGS VASCULAR Aorta: Unchanged infrarenal abdominal aortic aneurysm measuring up to 3.6 cm in diameter. No dissection, vasculitis or significant stenosis. Calcified and noncalcified  atherosclerotic plaque. 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 Appearance is suggestive of median arcuate ligament compression. 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 Renals: Single renal arteries bilaterally. Chronic severe stenosis of the distal left renal artery with severe left renal atrophy. Patent right proximal renal artery stent. IMA: Patent without evidence of aneurysm, dissection, vasculitis or significant stenosis. Inflow: Patent without evidence of aneurysm, dissection, vasculitis or significant stenosis. Veins: No obvious venous abnormality within the limitations of this arterial phase study. Review of the MIP images confirms the above findings. NON-VASCULAR Hepatobiliary: No focal liver abnormality is seen. No gallstones, gallbladder wall thickening, or biliary dilatation. Pancreas: No ductal dilatation or surrounding inflammatory changes. Multiple cystic pancreatic lesions measuring up to 2.6 cm, previously 2.5 cm. Spleen: Normal in size without focal abnormality. Adrenals/Urinary Tract: Adrenal glands are unremarkable. Chronic severe left renal atrophy. No renal calculi or hydronephrosis. Bladder is unremarkable. Stomach/Bowel: Unchanged small hiatal hernia. No bowel wall thickening, distention, or surrounding inflammatory changes. History of prior appendectomy. Lymphatic: No enlarged abdominal or pelvic lymph nodes. Reproductive: Status post hysterectomy. No adnexal masses. Other: No free fluid or pneumoperitoneum. Musculoskeletal: No acute or significant osseous findings. Review of the MIP images confirms the above findings. IMPRESSION: 1. No evidence of acute aortic syndrome. 2. Unchanged infrarenal abdominal aortic aneurysm measuring up to 3.6 cm in diameter. Recommend follow-up ultrasound every 2 years. 3. 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 No evidence of bowel ischemia. 4. Multiple  cystic pancreatic lesions measuring up to 2.6 cm. Given the patient's age, no routine follow-up is recommended. 5.  Aortic atherosclerosis (ICD10-I70.0). Electronically Signed   By: Obie Dredge M.D.   On: 08/22/2022 13:34   CT HEAD WO CONTRAST ( )  Result Date: 08/22/2022 CLINICAL DATA:  Acute onset of severe headache. EXAM: CT HEAD WITHOUT CONTRAST TECHNIQUE: Contiguous axial images were obtained from the base of the skull through the vertex without intravenous contrast. RADIATION DOSE REDUCTION: This exam was performed according to the departmental dose-optimization program which includes automated exposure control, adjustment of the mA and/or kV according to patient size and/or use of iterative reconstruction technique. COMPARISON:  06/05/2022 FINDINGS: Brain: No evidence of intracranial hemorrhage, acute infarction, hydrocephalus, extra-axial collection, or mass lesion/mass effect. Mild cerebral atrophy and chronic small vessel disease again noted. Vascular:  No hyperdense vessel or other acute findings. Skull: No evidence of fracture or other significant bone abnormality. Sinuses/Orbits:  No acute findings. Other: None. IMPRESSION: No acute intracranial abnormality. Stable mild cerebral atrophy and chronic small vessel disease. Electronically Signed   By: Danae Orleans M.D.   On: 08/22/2022 13:22   DG Bone Density  Result Date: 08/20/2022 EXAM: DUAL X-RAY ABSORPTIOMETRY (DXA) FOR BONE MINERAL DENSITY IMPRESSION: Your patient Bao Coreas completed a BMD test on 08/20/2022 using the [employer] DXA System (software version: 14.10) manufactured by [employer]. The following summarizes the results of our  evaluation. Technologist: [facility] PATIENT BIOGRAPHICAL: Name: Malyn, Aytes Patient ID: [id number] Birth Date: [birth year] Height: 64.0 in. Gender: Female Exam Date: 08/20/2022 Weight: 135.1 lbs. Indications: History of Breast Cancer, Advanced Age, Hysterectomy, Caucasian, History of Radiation,  Oophorectomy Bilateral, Postmenopausal Fractures: Treatments: Anastrozole, Calcium, Prolia, Vitamin D DENSITOMETRY RESULTS: Site         Region     Measured Date Measured Age WHO Classification Young Adult T-score BMD         %Change vs. Previous Significant Change (*) DualFemur Neck Right 08/20/2022 93.2 Osteoporosis -2.7 0.669 g/cm2 2.3% - DualFemur Neck Right 03/07/2020 90.8 Osteoporosis -2.8 0.654 g/cm2 - - DualFemur Total Mean 08/20/2022 93.2 Osteoporosis -2.6 0.685 g/cm2 1.3% - DualFemur Total Mean 03/07/2020 90.8 Osteoporosis -2.6 0.676 g/cm2 - - Left Forearm Radius 33% 08/20/2022 93.2 Osteoporosis -3.1 0.604 g/cm2 10.2% Yes Left Forearm Radius 33% 03/07/2020 90.8 Osteoporosis -3.7 0.548 g/cm2 - - ASSESSMENT: The BMD measured at Forearm Radius 33% is 0.604 g/cm2 with a T-score of -3.1. This patient is considered osteoporotic according to World Health Organization [facility]) criteria. The scan quality is good. Lumbar spine was not utilized due to advanced degenerative changes. Compared with prior study, there has been no significant change in the total hip. World Science writer [facility]) criteria for post-menopausal, Caucasian Women: Normal:                   T-score at or above -1 SD Osteopenia/low bone mass: T-score between -1 and -2.5 SD Osteoporosis:             T-score at or below -2.5 SD RECOMMENDATIONS: 1. All patients should optimize calcium and vitamin D intake. 2. Consider FDA-approved medical therapies in postmenopausal women and men aged 51 years and older, based on the following: a. A hip or vertebral(clinical or morphometric) fracture b. T-score < -2.5 at the femoral neck or spine after appropriate evaluation to exclude secondary causes c. Low bone mass (T-score between -1.0 and -2.5 at the femoral neck or spine) and a 10-year probability of a hip fracture > 3% or a 10-year probability of a major osteoporosis-related fracture > 20% based on the [place]-adapted WHO algorithm 3. Clinician judgment and/or  patient preferences may indicate treatment for people with 10-year fracture probabilities above or below these levels FOLLOW-UP: People with diagnosed cases of osteoporosis or at high risk for fracture should have regular bone mineral density tests. For patients eligible for Medicare, routine testing is allowed once every 2 years. The testing frequency can be increased to one year for patients who have rapidly progressing disease, those who are receiving or discontinuing medical therapy to restore bone mass, or have additional risk factors. I have reviewed this report, and agree with the above findings. [facility] Electronically Signed   By: Gerome Sam III M.D.   On: 08/20/2022 17:57    Assessment/Plan 1. Hypocalcemia ***  2. Hypomagnesemia ***  3. Secondary hyperparathyroidism of renal origin (HCC) ***    Harshith Pursell K. Biagio [facility] [phone]

## 2022-09-10 NOTE — Progress Notes (Signed)
Location:  Other Nursing Home Room Number: [facility] [address] Place of Service:  SNF [phone])  [name], MD  Patient Care Team: [name], MD as PCP - General (Family Medicine) Earna Coder, MD as Consulting Physician (Internal Medicine) Lemar Livings Merrily Pew, MD as Consulting Physician (General Surgery) Scarlett Presto, RN (Inactive) as Oncology Nurse Navigator Carmina Miller, MD as Referring Physician (Radiation Oncology)  Extended Emergency Contact Information Primary Emergency Contact: [facility] Phone: [phone] Relation: Daughter Secondary Emergency Contact: Rider,Kimberly Mobile Phone: [phone] Relation: Daughter  Goals of care: Advanced Directive information    09/07/2022   11:58 AM  Advanced Directives  Does Patient Have a Medical Advance Directive? Yes  Type of Advance Directive Out of facility DNR (pink MOST or yellow form)  Does patient want to make changes to medical advance directive? No - Patient declined     Chief Complaint  Patient presents with   Acute Visit    Abnormal Labs.     HPI:  Tina Curtis is a 89 y.o. female seen today for an acute visit for abnormal labs.  Tina Curtis went to the ED due to severely low calcium  Tina Curtis had calcium infusion and now feeling better. No longer having tremor.  On calcium supplement currently. Parathyroid level was 53 on 08/12/22 repeat done while Tina Curtis was in the hospital was 191  Mag level was also low from hospital.   Hx of breast cancer, Tina Curtis had surgery 3 years ago.   Tina Curtis has been followed by nephrology for her secondary hyperparathyroidism and in review her levels have varied from 51-219  Tina Curtis is doing very well today. Family at bedside.    Past Medical History:  Diagnosis Date   Arthritis    Gout   Chronic kidney disease    Diarrhea    Hypercholesteremia    Hypertension    Neuropathy    feet and lower legs   Seasonal allergies    Skin cancer    face   Past Surgical History:   Procedure Laterality Date   ABDOMINAL HYSTERECTOMY  2000   APPENDECTOMY     BREAST BIOPSY Right 09/19/2019   affirm bx of calcs UOQ, x marker, path pending   BREAST BIOPSY Right 09/19/2019   [place] bx of mass,heart marker, path pending   BREAST BIOPSY Right 09/19/2019   [place] bx of LN, coil marker, path pending   CATARACT EXTRACTION Left    CATARACT EXTRACTION W/PHACO Right 10/24/2014   Procedure: CATARACT EXTRACTION PHACO AND INTRAOCULAR LENS PLACEMENT (IOC);  Surgeon: Lockie Mola, MD;  Location: [facility];  Service: Ophthalmology;  Laterality: Right;   ESOPHAGOGASTRODUODENOSCOPY N/A 03/07/2018   Procedure: ESOPHAGOGASTRODUODENOSCOPY (EGD);  Surgeon: Toney Reil, MD;  Location: [facility] ENDOSCOPY;  Service: Gastroenterology;  Laterality: N/A;   MASTECTOMY MODIFIED RADICAL Right 10/13/2019   Procedure: MASTECTOMY MODIFIED RADICAL;  Surgeon: Earline Mayotte, MD;  Location: [facility] ORS;  Service: General;  Laterality: Right;   RENAL ANGIOGRAPHY Right 04/11/2018   Procedure: RENAL ANGIOGRAPHY;  Surgeon: Annice Needy, MD;  Location: [facility] INVASIVE CV LAB;  Service: Cardiovascular;  Laterality: Right;   SIMPLE MASTECTOMY WITH AXILLARY SENTINEL NODE BIOPSY Left 10/13/2019   Procedure: SIMPLE MASTECTOMY TRUE CUT BIOPSY, SENTINEL NODE BIOPSY;  Surgeon: Earline Mayotte, MD;  Location: [facility] ORS;  Service: General;  Laterality: Left;    Allergies  Allergen Reactions   Penicillins Anaphylaxis   Drug Ingredient [Zinc]     Outpatient Encounter Medications as of 09/10/2022  Medication Sig  albuterol (VENTOLIN HFA) 108 (90 Base) MCG/ACT inhaler Inhale 2 puffs into the lungs every 6 (six) hours as needed for wheezing or shortness of breath.   anastrozole (ARIMIDEX) 1 MG tablet TAKE 1 TABLET(1 MG) BY MOUTH DAILY. DO NOT. START IF RASH IS NOT BETTER   aspirin 81 MG EC tablet Take 81 mg by mouth daily.   allopurinol (ZYLOPRIM) 100 MG tablet Take 100 mg by mouth daily.   citalopram (CELEXA)  10 MG tablet Take 10 mg by mouth daily.    clopidogrel (PLAVIX) 75 MG tablet TAKE 1 TABLET BY MOUTH EVERY DAY   cyanocobalamin (VITAMIN B12) 1000 MCG tablet Take 1,000 mcg by mouth daily.   docusate sodium (COLACE) 100 MG capsule Take 100 mg by mouth daily as needed for mild constipation.   DULoxetine (CYMBALTA) 60 MG capsule Take 60 mg by mouth daily.    empagliflozin (JARDIANCE) 10 MG TABS tablet Take 10 mg by mouth daily.   Ergocalciferol 50 MCG (2000 UT) TABS Take 2,000 Units by mouth daily.   furosemide (LASIX) 40 MG tablet Take 1 tablet (40 mg total) by mouth daily.   gabapentin (NEURONTIN) 300 MG capsule Take 300 mg by mouth at bedtime.   heparin flush 10 UNIT/ML injection Inject into the vein. 3 mL one time daily for PICC line patency for 7 days   hydrALAZINE (APRESOLINE) 25 MG tablet Take 25 mg by mouth 3 (three) times daily.   isosorbide mononitrate (IMDUR) 30 MG 24 hr tablet Take 1 tablet (30 mg total) by mouth daily.   lidocaine (LIDODERM) 5 % Place 1 patch onto the skin daily. Remove & Discard patch within 12 hours or as directed by MD   loratadine (CLARITIN) 10 MG tablet Take 10 mg by mouth daily as needed for allergies. AM   metoprolol succinate (TOPROL-XL) 25 MG 24 hr tablet Take 37.5 mg by mouth at bedtime.    Multiple Vitamins-Minerals (ICAPS AREDS 2 PO) Take 2 capsules by mouth daily.   multivitamin (PROSIGHT) TABS tablet Take 1 tablet by mouth daily.   pantoprazole (PROTONIX) 40 MG tablet Take 1 tablet (40 mg total) by mouth daily.   simvastatin (ZOCOR) 20 MG tablet Take 20 mg by mouth every evening. PM   traMADol (ULTRAM) 50 MG tablet Take 1 tablet (50 mg total) by mouth every 12 (twelve) hours as needed for moderate pain.   triamcinolone cream (KENALOG) 0.1 % Apply 1 Application topically 2 (two) times daily.   Zinc Oxide (TRIPLE PASTE) 12.8 % ointment Apply 1 Application topically as directed. Apply to buttocks every shift for skin protection   [DISCONTINUED] calcium  carbonate (OS-CAL - DOSED IN MG OF ELEMENTAL CALCIUM) 1250 (500 Ca) MG tablet Take 2 tablets (2,500 mg total) by mouth 2 (two) times daily with a meal. (Patient taking differently: Take 2,500 mg by mouth in the morning, at noon, in the evening, and at bedtime.)   No facility-administered encounter medications on file as of 09/10/2022.    Review of Systems  Constitutional:  Negative for activity change, appetite change, fatigue and unexpected weight change.  HENT:  Negative for congestion and hearing loss.   Eyes: Negative.   Respiratory:  Negative for cough and shortness of breath.   Cardiovascular:  Negative for chest pain, palpitations and leg swelling.  Gastrointestinal:  Negative for abdominal pain, constipation and diarrhea.  Genitourinary:  Negative for difficulty urinating and dysuria.  Musculoskeletal:  Negative for arthralgias and myalgias.  Skin:  Negative for color change  and wound.  Neurological:  Negative for dizziness and weakness.  Psychiatric/Behavioral:  Negative for agitation, behavioral problems and confusion.     Immunization History  Administered Date(s) Administered   Influenza Inj Mdck Quad Pf 03/26/2016, 02/14/2018, 02/15/2019, 02/25/2021, 03/02/2022   Influenza Split 02/20/2015   Influenza, Seasonal, Injecte, Preservative Fre 04/13/2012, 03/17/2013   Influenza,inj,Quad PF,6+ Mos 03/07/2014, 02/20/2020   Influenza-Unspecified 03/26/2016, 02/04/2017, 02/14/2018, 02/25/2021   Moderna Sars-Covid-2 Vaccination 05/24/2019, 06/24/2019, 02/22/2020, 02/26/2022   Pneumococcal Conjugate-13 09/01/2013   Pneumococcal Polysaccharide-23 04/13/2012   Td 08/07/2010   Tdap 08/07/2010   Zoster, Live 09/12/2007   Pertinent  Health Maintenance Due  Topic Date Due   FOOT EXAM  Never done   OPHTHALMOLOGY EXAM  Never done   INFLUENZA VACCINE  12/10/2022   HEMOGLOBIN A1C  02/25/2023   DEXA SCAN  Completed      01/09/2022   10:55 AM 02/18/2022    1:36 PM 03/18/2022    4:19 PM  03/18/2022    4:46 PM 05/13/2022   10:17 AM  Fall Risk  (RETIRED) Patient Fall Risk Level High fall risk High fall risk Moderate fall risk Moderate fall risk High fall risk   Functional Status Survey:    Vitals:   09/10/22 1602  BP: (!) 172/74  Pulse: 71  Resp: 18  Temp: 98.1 F (36.7 C)  SpO2: 96%  Weight: 133 lb 12.8 oz (60.7 kg)  Height: 5\' 4"  (1.626 m)   Body mass index is 22.97 kg/m. Physical Exam Constitutional:      General: Tina Curtis is not in acute distress.    Appearance: Tina Curtis is well-developed. Tina Curtis is not diaphoretic.  HENT:     Head: Normocephalic and atraumatic.     Mouth/Throat:     Pharynx: No oropharyngeal exudate.  Eyes:     Conjunctiva/sclera: Conjunctivae normal.     Pupils: Pupils are equal, round, and reactive to light.  Cardiovascular:     Rate and Rhythm: Normal rate and regular rhythm.     Heart sounds: Normal heart sounds.  Pulmonary:     Effort: Pulmonary effort is normal.     Breath sounds: Normal breath sounds.  Abdominal:     General: Bowel sounds are normal.     Palpations: Abdomen is soft.  Musculoskeletal:     Cervical back: Normal range of motion and neck supple.     Right lower leg: No edema.     Left lower leg: No edema.  Skin:    General: Skin is warm and dry.  Neurological:     Mental Status: Tina Curtis is alert.  Psychiatric:        Mood and Affect: Mood normal.     Labs reviewed: Recent Labs    08/30/22 0437 08/30/22 1751 08/31/22 0526 09/01/22 0529 09/02/22 0745 09/03/22 0000 09/08/22 0913  NA 134*  --  134* 133* 136 133* 131*  K 3.0* 3.4* 3.6 3.2* 3.7 3.8 3.7  CL 105  --  106 104 108 100 101  CO2 19*  --  20* 21* 21* 24* 23  GLUCOSE 102*  --  111* 113* 110*  --  154*  BUN 28*  --  21 18 21 18 19   CREATININE 1.76* 1.78* 1.55* 1.55* 1.64* 1.7* 1.41*  CALCIUM 6.8*  --  7.2* 7.0* 6.6* 6.4* 7.6*  MG 2.0  --  1.9  --   --   --  1.5*  PHOS 1.7* 2.2* 1.5* 1.9*  --   --   --  Recent Labs    08/22/22 1110 08/26/22 0526  08/28/22 0214 08/29/22 1006 09/03/22 0000 09/08/22 0913  AST 50* 56*  --   --  46* 62*  ALT 22 35  --   --  24 34  ALKPHOS 93 72  --   --  91 111  BILITOT 0.6 1.1  --   --   --  0.6  PROT 7.7 7.4  --   --   --  6.8  ALBUMIN 4.0 3.9   < > 2.8* 2.7* 2.7*   < > = values in this interval not displayed.   Recent Labs    08/14/22 1325 08/22/22 1110 08/26/22 0526 08/26/22 0754 08/28/22 0214 08/29/22 0523 09/03/22 0000 09/08/22 0913  WBC 7.5   < > 6.4   < > 14.1* 13.1* 11.6 7.6  NEUTROABS 4.6  --  5.3  --   --   --  8,352.00  --   HGB 11.5*   < > 11.9*   < > 10.0* 10.1* 8.9* 9.5*  HCT 34.7*   < > 35.1*   < > 29.0* 29.7* 27* 29.5*  MCV 94.6   < > 92.1   < > 90.1 90.3  --  93.9  PLT 188   < > 176   < > 165 194 322 423*   < > = values in this interval not displayed.   Lab Results  Component Value Date   TSH 2.573 08/26/2020   Lab Results  Component Value Date   HGBA1C 7.0 (H) 08/26/2022   Lab Results  Component Value Date   CHOL 75 08/27/2022   HDL 22 (L) 08/27/2022   LDLCALC 34 08/27/2022   TRIG 97 08/27/2022   CHOLHDL 3.4 08/27/2022    Significant Diagnostic Results in last 30 days:  MR Lumbar Spine W Wo Contrast  Result Date: 09/01/2022 CLINICAL DATA:  Initial evaluation for back pain. EXAM: MRI LUMBAR SPINE WITHOUT AND WITH CONTRAST TECHNIQUE: Multiplanar and multiecho pulse sequences of the lumbar spine were obtained without and with intravenous contrast. CONTRAST:  6mL GADAVIST GADOBUTROL 1 MMOL/ML IV SOLN COMPARISON:  Prior study from 08/22/2022. FINDINGS: Segmentation: Standard. Lowest well-formed disc space labeled the L5-S1 level. Alignment: Sigmoid scoliotic curvature of the lumbar spine. 3 mm anterolisthesis of L4 on L5, with 5 mm anterolisthesis of L5 on S1. Findings chronic and facet mediated. Vertebrae: Vertebral body height maintained without acute or chronic fracture. Bone marrow signal intensity within normal limits. No discrete or worrisome osseous lesions.  Reactive marrow edema and enhancement present about the left L4-5 facet, most likely due to degenerative facet arthritis. No other abnormal marrow edema or enhancement. Conus medullaris and cauda equina: Conus extends to the L2 level. Conus and cauda equina appear normal. Paraspinal and other soft tissues: Prominent edema and enhancement seen within the left lower posterior paraspinous soft tissues adjacent to the left L4-5 facet (series 10, image 12). Finding is most likely due to adjacent facet arthritis. No drainable fluid collections. Paraspinous soft tissues demonstrate no other acute finding. Aortic atherosclerosis with 3.6 cm infrarenal aortic aneurysm. Few scattered subcentimeter T2 hyperintense cyst noted about the visualized right kidney, likely benign, with no follow-up imaging recommended. Asymmetric left renal atrophy noted. 1 cm benign appearing cyst noted within the spleen. Disc levels: T12-L1: Disc desiccation with mild disc bulge and endplate spurring. Mild facet hypertrophy. No stenosis. L1-2: Negative interspace. Mild facet hypertrophy. No canal or foraminal stenosis. L2-3: Degenerative intervertebral disc space narrowing with  diffuse disc bulge, slightly asymmetric to the right. Mild bilateral facet hypertrophy. No spinal stenosis. Foramina remain adequately patent. L3-4: Degenerative intervertebral disc space narrowing with diffuse disc bulge and disc desiccation. Associated reactive endplate change with marginal endplate osteophytic spurring. Moderate left with mild right facet arthrosis. Resultant mild canal with left lateral recess stenosis. Mild left L3 foraminal narrowing. Right neural foramina remains patent. L4-5: Disc desiccation with diffuse disc bulge. Superimposed small central disc protrusion with slight inferior migration. Moderate left greater than right facet arthrosis with associated small joint effusions. Reactive marrow edema and enhancement on the left. Resultant mild canal  with bilateral subarticular stenosis. Mild bilateral L4 foraminal narrowing. L5-S1: Anterolisthesis. Diffuse disc bulge with disc desiccation. Severe right with moderate left facet arthrosis. Resultant mild narrowing of the right lateral recess. Central canal remains patent. Mild to moderate right L5 foraminal stenosis. Left neural foramina remains patent. IMPRESSION: 1. Reactive edema and enhancement about the left L4-5 facet, most likely due to degenerative facet arthritis. Although possible infection with septic arthritis is felt to be unlikely, correlation with history and laboratory values is recommended. 2. No other acute abnormality within the lumbar spine. 3. Multifactorial degenerative changes at L3-4 through L5-S1 with resultant mild canal with bilateral lateral recess stenosis as above. 4. 3.6 cm infrarenal aortic aneurysm. Recommend follow-up ultrasound every 2 years. This recommendation follows ACR consensus guidelines: White Paper of the ACR Incidental Findings Committee II on Vascular Findings. J Am Coll Radiol 2013; 10:789-794. Aortic Atherosclerosis (ICD10-I70.0). Electronically Signed   By: Rise Mu M.D.   On: 09/01/2022 05:23   ECHOCARDIOGRAM COMPLETE  Result Date: 08/27/2022    ECHOCARDIOGRAM REPORT   Patient Name:   Tina Curtis Date of Exam: 08/27/2022 Medical Rec #:  [id number]     Height:       64.0 in Accession #:    [id number]    Weight:       132.3 lb Date of Birth:  06/19/1926    BSA:          1.641 m Patient Age:    89 years      BP:           136/66 mmHg Patient Gender: F             HR:           67 bpm. Exam Location:  [facility] Procedure: 2D Echo, Cardiac Doppler and Color Doppler Indications:     Elevated Troponin  History:         Patient has no prior history of Echocardiogram examinations.                  CHF, Acute MI, Signs/Symptoms:Chest Pain,                  Dizziness/Lightheadedness and Fatigue; Risk                  Factors:Hypertension and Diabetes. Breast CA.   Sonographer:     Mikki Harbor Referring Phys:  [id number] [facility] MICHELLE TANG Diagnosing Phys: Marcina Millard MD IMPRESSIONS  1. Left ventricular ejection fraction, by estimation, is 60 to 65%. The left ventricle has normal function. The left ventricle has no regional wall motion abnormalities. Left ventricular diastolic parameters are consistent with Grade I diastolic dysfunction (impaired relaxation).  2. Right ventricular systolic function is normal. The right ventricular size is normal. There is moderately elevated pulmonary artery systolic pressure.  3. Left atrial size was mild  to moderately dilated.  4. Right atrial size was mild to moderately dilated.  5. The mitral valve is normal in structure. Moderate to severe mitral valve regurgitation. No evidence of mitral stenosis.  6. Tricuspid valve regurgitation is moderate to severe.  7. The aortic valve is normal in structure. Aortic valve regurgitation is mild. No aortic stenosis is present.  8. The inferior vena cava is normal in size with greater than 50% respiratory variability, suggesting right atrial pressure of 3 mmHg. FINDINGS  Left Ventricle: Left ventricular ejection fraction, by estimation, is 60 to 65%. The left ventricle has normal function. The left ventricle has no regional wall motion abnormalities. The left ventricular internal cavity size was normal in size. There is  no left ventricular hypertrophy. Left ventricular diastolic parameters are consistent with Grade I diastolic dysfunction (impaired relaxation). Right Ventricle: The right ventricular size is normal. No increase in right ventricular wall thickness. Right ventricular systolic function is normal. There is moderately elevated pulmonary artery systolic pressure. The tricuspid regurgitant velocity is 3.60 m/s, and with an assumed right atrial pressure of 8 mmHg, the estimated right ventricular systolic pressure is 59.8 mmHg. Left Atrium: Left atrial size was mild to moderately  dilated. Right Atrium: Right atrial size was mild to moderately dilated. Pericardium: There is no evidence of pericardial effusion. Mitral Valve: The mitral valve is normal in structure. Moderate to severe mitral valve regurgitation. No evidence of mitral valve stenosis. MV peak gradient, 4.8 mmHg. The mean mitral valve gradient is 1.0 mmHg. Tricuspid Valve: The tricuspid valve is normal in structure. Tricuspid valve regurgitation is moderate to severe. No evidence of tricuspid stenosis. Aortic Valve: The aortic valve is normal in structure. Aortic valve regurgitation is mild. No aortic stenosis is present. Aortic valve mean gradient measures 4.0 mmHg. Aortic valve peak gradient measures 10.1 mmHg. Aortic valve area, by VTI measures 2.75  cm. Pulmonic Valve: The pulmonic valve was normal in structure. Pulmonic valve regurgitation is not visualized. No evidence of pulmonic stenosis. Aorta: The aortic root is normal in size and structure. Venous: The inferior vena cava is normal in size with greater than 50% respiratory variability, suggesting right atrial pressure of 3 mmHg. IAS/Shunts: No atrial level shunt detected by color flow Doppler.  LEFT VENTRICLE PLAX 2D LVIDd:         4.50 cm   Diastology LVIDs:         2.80 cm   LV e' medial:    6.42 cm/s LV PW:         1.00 cm   LV E/e' medial:  17.0 LV IVS:        1.30 cm   LV e' lateral:   8.70 cm/s LVOT diam:     2.10 cm   LV E/e' lateral: 12.5 LV SV:         86 LV SV Index:   52 LVOT Area:     3.46 cm  RIGHT VENTRICLE RV Basal diam:  3.65 cm RV Mid diam:    2.70 cm RV S prime:     8.92 cm/s TAPSE (M-mode): 2.0 cm LEFT ATRIUM              Index        RIGHT ATRIUM           Index LA diam:        4.10 cm  2.50 cm/m   RA Area:     19.20 cm LA Vol (A2C):   115.0  ml 70.07 ml/m  RA Volume:   50.40 ml  30.71 ml/m LA Vol (A4C):   61.7 ml  37.60 ml/m LA Biplane Vol: 85.5 ml  52.10 ml/m  AORTIC VALVE                    PULMONIC VALVE AV Area (Vmax):    2.03 cm     PV  Vmax:       0.90 m/s AV Area (Vmean):   2.40 cm     PV Peak grad:  3.2 mmHg AV Area (VTI):     2.75 cm AV Vmax:           159.00 cm/s AV Vmean:          91.500 cm/s AV VTI:            0.311 m AV Peak Grad:      10.1 mmHg AV Mean Grad:      4.0 mmHg LVOT Vmax:         93.40 cm/s LVOT Vmean:        63.300 cm/s LVOT VTI:          0.247 m LVOT/AV VTI ratio: 0.79  AORTA Ao Root diam: 3.30 cm Ao Asc diam:  4.10 cm MITRAL VALVE                TRICUSPID VALVE MV Area (PHT): 4.65 cm     TR Peak grad:   51.8 mmHg MV Area VTI:   2.77 cm     TR Vmax:        360.00 cm/s MV Peak grad:  4.8 mmHg MV Mean grad:  1.0 mmHg     SHUNTS MV Vmax:       1.10 m/s     Systemic VTI:  0.25 m MV Vmean:      54.4 cm/s    Systemic Diam: 2.10 cm MV Decel Time: 163 msec MV E velocity: 109.00 cm/s MV A velocity: 77.00 cm/s MV E/A ratio:  1.42 Marcina Millard MD Electronically signed by Marcina Millard MD Signature Date/Time: 08/27/2022/1:16:52 PM    Final    US Venous Img Lower Bilateral (DVT)  Result Date: 08/26/2022 CLINICAL DATA:  [id number] Positive D dimer [id number]. EXAM: BILATERAL LOWER EXTREMITY VENOUS DOPPLER ULTRASOUND TECHNIQUE: Gray-scale sonography with compression, as well as color and duplex ultrasound, were performed to evaluate the deep venous system(s) from the level of the common femoral vein through the popliteal and proximal calf veins. COMPARISON:  None Available. FINDINGS: VENOUS Normal compressibility of the common femoral, superficial femoral, and popliteal veins, as well as the visualized calf veins. Visualized portions of profunda femoral vein and great saphenous vein unremarkable. No filling defects to suggest DVT on grayscale or color Doppler imaging. Doppler waveforms show normal direction of venous flow, normal respiratory plasticity and response to augmentation. OTHER Cyst in the left popliteal fossa measuring up to 5 cm, likely Baker's cyst. Limitations: none IMPRESSION: 1. No lower extremity DVT. 2. 5 cm  left Baker's cyst. Electronically Signed   By: Orvan Falconer M.D.   On: 08/26/2022 15:45   NM Pulmonary Perfusion  Result Date: 08/26/2022 CLINICAL DATA:  Pulmonary embolus suspected EXAM: NUCLEAR MEDICINE PERFUSION LUNG SCAN TECHNIQUE: Perfusion images were obtained in multiple projections after intravenous injection of radiopharmaceutical. Ventilation scans intentionally deferred if perfusion scan and chest x-ray adequate for interpretation during COVID 19 epidemic. RADIOPHARMACEUTICALS:  4.2 mCi Tc-66m MAA IV COMPARISON:  None Available. FINDINGS: Bilateral mild-to-moderate perfusion  defects. No large perfusion defects. IMPRESSION: Nondiagnostic (intermediate probability). Correlate with lower extremity ultrasound and consider chest CTA for further evaluation. Electronically Signed   By: Allegra Lai M.D.   On: 08/26/2022 15:10   DG Chest Port 1 View  Result Date: 08/26/2022 CLINICAL DATA:  89 year old female with history of hematemesis and generalized weakness. EXAM: PORTABLE CHEST 1 VIEW COMPARISON:  Chest x-ray 06/07/2022. FINDINGS: Lung volumes are low. Bibasilar opacities favored to reflect areas of subsegmental atelectasis. Diffuse interstitial prominence and widespread peribronchial cuffing. No definite pleural effusions. No pneumothorax. No evidence of pulmonary edema. Heart size is normal. Upper mediastinal contours are within normal limits allowing for patient positioning. Atherosclerotic calcifications in the thoracic aorta. IMPRESSION: 1. The appearance of the chest is concerning for probable acute bronchitis, as above. Some degree of underlying interstitial lung disease is also suspected. 2. Aortic atherosclerosis. Electronically Signed   By: Trudie Reed M.D.   On: 08/26/2022 05:51   CT Angio Chest/Abd/Pel for Dissection W and/or Wo Contrast  Result Date: 08/22/2022 CLINICAL DATA:  Vomiting and diarrhea. Concern for acute aortic syndrome. History of breast cancer. EXAM: CT  ANGIOGRAPHY CHEST, ABDOMEN AND PELVIS TECHNIQUE: Non-contrast CT of the chest was initially obtained. Multidetector CT imaging through the chest, abdomen and pelvis was performed using the standard protocol during bolus administration of intravenous contrast. Multiplanar reconstructed images and MIPs were obtained and reviewed to evaluate the vascular anatomy. RADIATION DOSE REDUCTION: This exam was performed according to the departmental dose-optimization program which includes automated exposure control, adjustment of the mA and/or kV according to patient size and/or use of iterative reconstruction technique. CONTRAST:  80mL OMNIPAQUE IOHEXOL 350 MG/ML SOLN COMPARISON:  CT chest dated June 08, 2022. CT abdomen pelvis dated March 18, 2022. FINDINGS: CTA CHEST FINDINGS Cardiovascular: Preferential opacification of the thoracic aorta. No evidence of thoracic aortic aneurysm or dissection. No intramural hematoma. Coronary, aortic arch, and branch vessel atherosclerotic vascular disease. Normal heart size. No pericardial effusion. No pulmonary embolism. Mediastinum/Nodes: No enlarged mediastinal, hilar, or axillary lymph nodes. Thyroid gland, trachea, and esophagus demonstrate no significant findings. Lungs/Pleura: Bibasilar scarring. No focal consolidation, pleural effusion, or pneumothorax. Musculoskeletal: Status post bilateral mastectomies. No acute or significant osseous findings. Review of the MIP images confirms the above findings. CTA ABDOMEN AND PELVIS FINDINGS VASCULAR Aorta: Unchanged infrarenal abdominal aortic aneurysm measuring up to 3.6 cm in diameter. No dissection, vasculitis or significant stenosis. Calcified and noncalcified atherosclerotic plaque. 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 Appearance is suggestive of median arcuate ligament compression. 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 Renals: Single  renal arteries bilaterally. Chronic severe stenosis of the distal left renal artery with severe left renal atrophy. Patent right proximal renal artery stent. IMA: Patent without evidence of aneurysm, dissection, vasculitis or significant stenosis. Inflow: Patent without evidence of aneurysm, dissection, vasculitis or significant stenosis. Veins: No obvious venous abnormality within the limitations of this arterial phase study. Review of the MIP images confirms the above findings. NON-VASCULAR Hepatobiliary: No focal liver abnormality is seen. No gallstones, gallbladder wall thickening, or biliary dilatation. Pancreas: No ductal dilatation or surrounding inflammatory changes. Multiple cystic pancreatic lesions measuring up to 2.6 cm, previously 2.5 cm. Spleen: Normal in size without focal abnormality. Adrenals/Urinary Tract: Adrenal glands are unremarkable. Chronic severe left renal atrophy. No renal calculi or hydronephrosis. Bladder is unremarkable. Stomach/Bowel: Unchanged small hiatal hernia. No bowel wall thickening, distention, or surrounding  inflammatory changes. History of prior appendectomy. Lymphatic: No enlarged abdominal or pelvic lymph nodes. Reproductive: Status post hysterectomy. No adnexal masses. Other: No free fluid or pneumoperitoneum. Musculoskeletal: No acute or significant osseous findings. Review of the MIP images confirms the above findings. IMPRESSION: 1. No evidence of acute aortic syndrome. 2. Unchanged infrarenal abdominal aortic aneurysm measuring up to 3.6 cm in diameter. Recommend follow-up ultrasound every 2 years. 3. 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 No evidence of bowel ischemia. 4. Multiple cystic pancreatic lesions measuring up to 2.6 cm. Given the patient's age, no routine follow-up is recommended. 5.  Aortic atherosclerosis (ICD10-I70.0). Electronically Signed   By: Obie Dredge M.D.   On: 08/22/2022 13:34   CT HEAD WO CONTRAST ( )  Result Date:  08/22/2022 CLINICAL DATA:  Acute onset of severe headache. EXAM: CT HEAD WITHOUT CONTRAST TECHNIQUE: Contiguous axial images were obtained from the base of the skull through the vertex without intravenous contrast. RADIATION DOSE REDUCTION: This exam was performed according to the departmental dose-optimization program which includes automated exposure control, adjustment of the mA and/or kV according to patient size and/or use of iterative reconstruction technique. COMPARISON:  06/05/2022 FINDINGS: Brain: No evidence of intracranial hemorrhage, acute infarction, hydrocephalus, extra-axial collection, or mass lesion/mass effect. Mild cerebral atrophy and chronic small vessel disease again noted. Vascular:  No hyperdense vessel or other acute findings. Skull: No evidence of fracture or other significant bone abnormality. Sinuses/Orbits:  No acute findings. Other: None. IMPRESSION: No acute intracranial abnormality. Stable mild cerebral atrophy and chronic small vessel disease. Electronically Signed   By: Danae Orleans M.D.   On: 08/22/2022 13:22   DG Bone Density  Result Date: 08/20/2022 EXAM: DUAL X-RAY ABSORPTIOMETRY (DXA) FOR BONE MINERAL DENSITY IMPRESSION: Your patient Tina Curtis completed a BMD test on 08/20/2022 using the [employer] DXA System (software version: 14.10) manufactured by [employer]. The following summarizes the results of our evaluation. Technologist: [facility] PATIENT BIOGRAPHICAL: Name: Tina Curtis, Tina Curtis Patient ID: [id number] Birth Date: 04-12-1927 Height: 64.0 in. Gender: Female Exam Date: 08/20/2022 Weight: 135.1 lbs. Indications: History of Breast Cancer, Advanced Age, Hysterectomy, Caucasian, History of Radiation, Oophorectomy Bilateral, Postmenopausal Fractures: Treatments: Anastrozole, Calcium, Prolia, Vitamin D DENSITOMETRY RESULTS: Site         Region     Measured Date Measured Age WHO Classification Young Adult T-score BMD         %Change vs. Previous Significant Change (*)  DualFemur Neck Right 08/20/2024 93.2 Osteoporosis -2.7 0.669 g/cm2 2.3% - DualFemur Neck Right 03/07/2020 90.8 Osteoporosis -2.8 0.654 g/cm2 - - DualFemur Total Mean 08/20/2022 93.2 Osteoporosis -2.6 0.685 g/cm2 1.3% - DualFemur Total Mean 03/07/2020 90.8 Osteoporosis -2.6 0.676 g/cm2 - - Left Forearm Radius 33% 08/20/2022 93.2 Osteoporosis -3.1 0.604 g/cm2 10.2% Yes Left Forearm Radius 33% 03/07/2020 90.8 Osteoporosis -3.7 0.548 g/cm2 - - ASSESSMENT: The BMD measured at Forearm Radius 33% is 0.604 g/cm2 with a T-score of -3.1. This patient is considered osteoporotic according to World Health Organization [facility]) criteria. The scan quality is good. Lumbar spine was not utilized due to advanced degenerative changes. Compared with prior study, there has been no significant change in the total hip. World Health Organization [facility]) criteria for post-menopausal, Caucasian Women: Normal:                   T-score at or above -1 SD Osteopenia/low bone mass: T-score between -1 and -2.5 SD Osteoporosis:  T-score at or below -2.5 SD RECOMMENDATIONS: 1. All patients should optimize calcium and vitamin D intake. 2. Consider FDA-approved medical therapies in postmenopausal women and men aged 91 years and older, based on the following: a. A hip or vertebral(clinical or morphometric) fracture b. T-score < -2.5 at the femoral neck or spine after appropriate evaluation to exclude secondary causes c. Low bone mass (T-score between -1.0 and -2.5 at the femoral neck or spine) and a 10-year probability of a hip fracture > 3% or a 10-year probability of a major osteoporosis-related fracture > 20% based on the [place]-adapted WHO algorithm 3. Clinician judgment and/or patient preferences may indicate treatment for people with 10-year fracture probabilities above or below these levels FOLLOW-UP: People with diagnosed cases of osteoporosis or at high risk for fracture should have regular bone mineral density tests. For patients eligible  for Medicare, routine testing is allowed once every 2 years. The testing frequency can be increased to one year for patients who have rapidly progressing disease, those who are receiving or discontinuing medical therapy to restore bone mass, or have additional risk factors. I have reviewed this report, and agree with the above findings. [facility] Electronically Signed   By: Gerome Sam III M.D.   On: 08/20/2022 17:57    Assessment/Plan 1. Hypocalcemia Continues on supplement with recent IV infusion, will follow up BMP in the am to continue to follow  2. Hypomagnesemia Significantly low, will add magnesium supplement and recheck in 1 week  3. Secondary hyperparathyroidism of renal origin [facility]) Ongoing, Tina Curtis has been follow up nephrology due to this, will get ultrasound of neck at this time to further evaluate and have her follow up with nephrologist at this time.   Janene Harvey. Biagio Borg [facility] [phone]   Total time 35 mins:  time greater than 50% of total time spent doing Tina Curtis counseling and coordination of care with family and Tina Curtis on her calcium and PTH.

## 2022-09-11 LAB — BASIC METABOLIC PANEL
BUN: 23 — AB (ref 4–21)
CO2: 24 — AB (ref 13–22)
Chloride: 99 (ref 99–108)
Creatinine: 1.2 — AB (ref 0.5–1.1)
Glucose: 139
Potassium: 4.5 mEq/L (ref 3.5–5.1)
Sodium: 135 — AB (ref 137–147)

## 2022-09-11 LAB — COMPREHENSIVE METABOLIC PANEL
Albumin: 3.1 — AB (ref 3.5–5.0)
Calcium: 8.8 (ref 8.7–10.7)
Globulin: 3.6
eGFR: 41

## 2022-09-11 LAB — HEPATIC FUNCTION PANEL
ALT: 27 U/L (ref 7–35)
AST: 56 — AB (ref 13–35)
Alkaline Phosphatase: 130 — AB (ref 25–125)
Bilirubin, Total: 0.4

## 2022-09-15 ENCOUNTER — Non-Acute Institutional Stay (SKILLED_NURSING_FACILITY): Payer: Medicare PPO | Admitting: Nurse Practitioner

## 2022-09-15 ENCOUNTER — Encounter: Payer: Self-pay | Admitting: Nurse Practitioner

## 2022-09-15 DIAGNOSIS — A409 Streptococcal sepsis, unspecified: Secondary | ICD-10-CM | POA: Diagnosis not present

## 2022-09-15 DIAGNOSIS — E1122 Type 2 diabetes mellitus with diabetic chronic kidney disease: Secondary | ICD-10-CM

## 2022-09-15 DIAGNOSIS — I150 Renovascular hypertension: Secondary | ICD-10-CM

## 2022-09-15 DIAGNOSIS — Z79899 Other long term (current) drug therapy: Secondary | ICD-10-CM

## 2022-09-15 DIAGNOSIS — J9601 Acute respiratory failure with hypoxia: Secondary | ICD-10-CM

## 2022-09-15 DIAGNOSIS — R6520 Severe sepsis without septic shock: Secondary | ICD-10-CM

## 2022-09-15 DIAGNOSIS — I2510 Atherosclerotic heart disease of native coronary artery without angina pectoris: Secondary | ICD-10-CM

## 2022-09-15 DIAGNOSIS — N2581 Secondary hyperparathyroidism of renal origin: Secondary | ICD-10-CM | POA: Diagnosis not present

## 2022-09-15 DIAGNOSIS — N1832 Chronic kidney disease, stage 3b: Secondary | ICD-10-CM

## 2022-09-15 NOTE — Progress Notes (Signed)
Location:  Other [place].  Nursing Home Room Number: [facility] [address] Place of Service:  SNF [phone]) Abbey Chatters. NP  PCP: [name], MD  Patient Care Team: [name], MD as PCP - General (Family Medicine) Earna Coder, MD as Consulting Physician (Internal Medicine) Lemar Livings Merrily Pew, MD as Consulting Physician (General Surgery) Scarlett Presto, RN (Inactive) as Oncology Nurse Navigator Carmina Miller, MD as Referring Physician (Radiation Oncology)  Extended Emergency Contact Information Primary Emergency Contact: [facility] Phone: [phone] Relation: Daughter Secondary Emergency Contact: Rider,Kimberly Mobile Phone: [phone] Relation: Daughter  Goals of care: Advanced Directive information    09/15/2022   12:51 PM  Advanced Directives  Does Patient Have a Medical Advance Directive? Yes  Type of Advance Directive Out of facility DNR (pink MOST or yellow form)  Does patient want to make changes to medical advance directive? No - Patient declined     Chief Complaint  Patient presents with   Discharge Note    Discharge    HPI:  Pt is a 89 y.o. female seen today for Discharge home.  She was hospitalized for pneumonia and bacteremia and went to rehab to continue therapy and IV antibiotics which she has completed. She had to go back to the hospital for severe tremor and profound hypocalcemia- received IV calcium and now on oral agents.  She reports she feels good and back to baseline and ready to go home.  She will have support with the [facility] team once discharged.    Past Medical History:  Diagnosis Date   Arthritis    Gout   Chronic kidney disease    Diarrhea    Hypercholesteremia    Hypertension    Neuropathy    feet and lower legs   Seasonal allergies    Skin cancer    face   Past Surgical History:  Procedure Laterality Date   ABDOMINAL HYSTERECTOMY  2000   APPENDECTOMY     BREAST BIOPSY Right 09/19/2019    affirm bx of calcs UOQ, x marker, path pending   BREAST BIOPSY Right 09/19/2019   [place] bx of mass,heart marker, path pending   BREAST BIOPSY Right 09/19/2019   [place] bx of LN, coil marker, path pending   CATARACT EXTRACTION Left    CATARACT EXTRACTION W/PHACO Right 10/24/2014   Procedure: CATARACT EXTRACTION PHACO AND INTRAOCULAR LENS PLACEMENT (IOC);  Surgeon: Lockie Mola, MD;  Location: [facility];  Service: Ophthalmology;  Laterality: Right;   ESOPHAGOGASTRODUODENOSCOPY N/A 03/07/2018   Procedure: ESOPHAGOGASTRODUODENOSCOPY (EGD);  Surgeon: Toney Reil, MD;  Location: [facility] ENDOSCOPY;  Service: Gastroenterology;  Laterality: N/A;   MASTECTOMY MODIFIED RADICAL Right 10/13/2019   Procedure: MASTECTOMY MODIFIED RADICAL;  Surgeon: Earline Mayotte, MD;  Location: [facility] ORS;  Service: General;  Laterality: Right;   RENAL ANGIOGRAPHY Right 04/11/2018   Procedure: RENAL ANGIOGRAPHY;  Surgeon: Annice Needy, MD;  Location: [facility] INVASIVE CV LAB;  Service: Cardiovascular;  Laterality: Right;   SIMPLE MASTECTOMY WITH AXILLARY SENTINEL NODE BIOPSY Left 10/13/2019   Procedure: SIMPLE MASTECTOMY TRUE CUT BIOPSY, SENTINEL NODE BIOPSY;  Surgeon: Earline Mayotte, MD;  Location: [facility] ORS;  Service: General;  Laterality: Left;    Allergies  Allergen Reactions   Penicillins Anaphylaxis   Drug Ingredient [Zinc]     Outpatient Encounter Medications as of 09/15/2022  Medication Sig   albuterol (VENTOLIN HFA) 108 (90 Base) MCG/ACT inhaler Inhale 2 puffs into the lungs every 6 (six) hours as needed for wheezing  or shortness of breath.   anastrozole (ARIMIDEX) 1 MG tablet TAKE 1 TABLET(1 MG) BY MOUTH DAILY. DO NOT. START IF RASH IS NOT BETTER   aspirin 81 MG EC tablet Take 81 mg by mouth daily.   calcium carbonate (OS-CAL - DOSED IN MG OF ELEMENTAL CALCIUM) 1250 (500 Ca) MG tablet Take 2 tablets by mouth 4 (four) times daily.   citalopram (CELEXA) 10 MG tablet Take 10 mg by mouth daily.     clopidogrel (PLAVIX) 75 MG tablet TAKE 1 TABLET BY MOUTH EVERY DAY   cyanocobalamin (VITAMIN B12) 1000 MCG tablet Take 1,000 mcg by mouth daily.   docusate sodium (COLACE) 100 MG capsule Take 100 mg by mouth daily as needed for mild constipation.   DULoxetine (CYMBALTA) 60 MG capsule Take 60 mg by mouth daily.    furosemide (LASIX) 40 MG tablet Take 1 tablet (40 mg total) by mouth daily.   gabapentin (NEURONTIN) 300 MG capsule Take 300 mg by mouth at bedtime.   hydrALAZINE (APRESOLINE) 25 MG tablet Take 25 mg by mouth 3 (three) times daily.   isosorbide mononitrate (IMDUR) 30 MG 24 hr tablet Take 1 tablet (30 mg total) by mouth daily.   lidocaine (LIDODERM) 5 % Place 1 patch onto the skin daily. Remove & Discard patch within 12 hours or as directed by MD   loratadine (CLARITIN) 10 MG tablet Take 10 mg by mouth daily as needed for allergies. AM   Magnesium 400 MG TABS Take 800 mg by mouth in the morning and at bedtime.   metoprolol succinate (TOPROL-XL) 25 MG 24 hr tablet Take 37.5 mg by mouth at bedtime.    Multiple Vitamins-Minerals (ICAPS AREDS 2 PO) Take 2 capsules by mouth daily.   multivitamin (PROSIGHT) TABS tablet Take 1 tablet by mouth daily.   OLANZapine (ZYPREXA) 2.5 MG tablet Take 2.5 mg by mouth at bedtime.   pantoprazole (PROTONIX) 40 MG tablet Take 1 tablet (40 mg total) by mouth daily.   simvastatin (ZOCOR) 20 MG tablet Take 20 mg by mouth every evening. PM   traMADol (ULTRAM) 50 MG tablet Take 1 tablet (50 mg total) by mouth every 12 (twelve) hours as needed for moderate pain.   triamcinolone cream (KENALOG) 0.1 % Apply 1 Application topically 2 (two) times daily.   Vitamin D, Ergocalciferol, (DRISDOL) 1.25 MG (50000 UNIT) CAPS capsule Take 50,000 Units by mouth every 7 (seven) days.   Zinc Oxide (TRIPLE PASTE) 12.8 % ointment Apply 1 Application topically as directed. Apply to buttocks every shift for skin protection   [DISCONTINUED] cefTRIAXone (ROCEPHIN) 1-3.74 GM-%(50ML) IVPB  2 grams intravenously once daily.   [DISCONTINUED] heparin flush 10 UNIT/ML injection Inject into the vein. 3 mL one time daily for PICC line patency for 7 days   No facility-administered encounter medications on file as of 09/15/2022.    Review of Systems  Constitutional:  Negative for activity change, appetite change, fatigue and unexpected weight change.  HENT:  Negative for congestion and hearing loss.   Eyes: Negative.   Respiratory:  Negative for cough and shortness of breath.   Cardiovascular:  Negative for chest pain, palpitations and leg swelling.  Gastrointestinal:  Negative for abdominal pain, constipation and diarrhea.  Genitourinary:  Negative for difficulty urinating and dysuria.  Musculoskeletal:  Negative for arthralgias and myalgias.  Skin:  Negative for color change and wound.  Neurological:  Negative for dizziness and weakness.  Psychiatric/Behavioral:  Negative for agitation, behavioral problems and confusion.  Immunization History  Administered Date(s) Administered   Influenza Inj Mdck Quad Pf 03/26/2016, 02/14/2018, 02/15/2019, 02/25/2021, 03/02/2022   Influenza Split 02/20/2015   Influenza, Seasonal, Injecte, Preservative Fre 04/13/2012, 03/17/2013   Influenza,inj,Quad PF,6+ Mos 03/07/2014, 02/20/2020   Influenza-Unspecified 03/26/2016, 02/04/2017, 02/14/2018, 02/25/2021   Moderna Sars-Covid-2 Vaccination 05/24/2019, 06/24/2019, 02/22/2020, 02/26/2022   Pneumococcal Conjugate-13 09/01/2013   Pneumococcal Polysaccharide-23 04/13/2012   Td 08/07/2010   Tdap 08/07/2010   Zoster, Live 09/12/2007   Pertinent  Health Maintenance Due  Topic Date Due   FOOT EXAM  Never done   OPHTHALMOLOGY EXAM  Never done   INFLUENZA VACCINE  12/10/2022   HEMOGLOBIN A1C  02/25/2023   DEXA SCAN  Completed      01/09/2022   10:55 AM 02/18/2022    1:36 PM 03/18/2022    4:19 PM 03/18/2022    4:46 PM 05/13/2022   10:17 AM  Fall Risk  (RETIRED) Patient Fall Risk Level High fall  risk High fall risk Moderate fall risk Moderate fall risk High fall risk   Functional Status Survey:    Vitals:   09/15/22 1237  BP: 120/60  Pulse: 75  Resp: 16  Temp: 97.6 F (36.4 C)  SpO2: 97%  Weight: 133 lb 12.8 oz (60.7 kg)  Height: 5\' 4"  (1.626 m)   Body mass index is 22.97 kg/m. Physical Exam Constitutional:      General: She is not in acute distress.    Appearance: She is well-developed. She is not diaphoretic.  HENT:     Head: Normocephalic and atraumatic.     Mouth/Throat:     Pharynx: No oropharyngeal exudate.  Eyes:     Conjunctiva/sclera: Conjunctivae normal.     Pupils: Pupils are equal, round, and reactive to light.  Cardiovascular:     Rate and Rhythm: Normal rate and regular rhythm.     Heart sounds: Normal heart sounds.  Pulmonary:     Effort: Pulmonary effort is normal.     Breath sounds: Normal breath sounds.  Abdominal:     General: Bowel sounds are normal.     Palpations: Abdomen is soft.  Musculoskeletal:     Cervical back: Normal range of motion and neck supple.     Right lower leg: No edema.     Left lower leg: No edema.  Skin:    General: Skin is warm and dry.  Neurological:     Mental Status: She is alert.  Psychiatric:        Mood and Affect: Mood normal.     Labs reviewed: Recent Labs    08/30/22 0437 08/30/22 1751 08/31/22 0526 09/01/22 0529 09/02/22 0745 09/03/22 0000 09/08/22 0913 09/09/22 0000 09/11/22 0000  NA 134*  --  134* 133* 136   < > 131* 131* 135*  K 3.0* 3.4* 3.6 3.2* 3.7   < > 3.7 4.5 4.5  CL 105  --  106 104 108   < > 101 102 99  CO2 19*  --  20* 21* 21*   < > 23 22 24*  GLUCOSE 102*  --  111* 113* 110*  --  154*  --   --   BUN 28*  --  21 18 21    < > 19 15 23*  CREATININE 1.76* 1.78* 1.55* 1.55* 1.64*   < > 1.41* 1.3* 1.2*  CALCIUM 6.8*  --  7.2* 7.0* 6.6*   < > 7.6* 8.1* 8.8  MG 2.0  --  1.9  --   --   --  1.5*  --   --   PHOS 1.7* 2.2* 1.5* 1.9*  --   --   --   --   --    < > = values in this  interval not displayed.   Recent Labs    08/22/22 1110 08/26/22 0526 08/28/22 0214 09/03/22 0000 09/08/22 0913 09/09/22 0000 09/11/22 0000  AST 50* 56*  --  46* 62* 59* 56*  ALT 22 35  --  24 34 34 27  ALKPHOS 93 72  --  91 111  --  130*  BILITOT 0.6 1.1  --   --  0.6  --   --   PROT 7.7 7.4  --   --  6.8  --   --   ALBUMIN 4.0 3.9   < > 2.7* 2.7* 3.2* 3.1*   < > = values in this interval not displayed.   Recent Labs    08/26/22 0526 08/26/22 0754 08/28/22 0214 08/29/22 0523 09/03/22 0000 09/08/22 0913 09/09/22 0000  WBC 6.4   < > 14.1* 13.1* 11.6 7.6 7.5  NEUTROABS 5.3  --   --   --  8,352.00  --  4,343.00  HGB 11.9*   < > 10.0* 10.1* 8.9* 9.5* 10.4*  HCT 35.1*   < > 29.0* 29.7* 27* 29.5* 32*  MCV 92.1   < > 90.1 90.3  --  93.9  --   PLT 176   < > 165 194 322 423* 460*   < > = values in this interval not displayed.   Lab Results  Component Value Date   TSH 2.573 08/26/2020   Lab Results  Component Value Date   HGBA1C 7.0 (H) 08/26/2022   Lab Results  Component Value Date   CHOL 75 08/27/2022   HDL 22 (L) 08/27/2022   LDLCALC 34 08/27/2022   TRIG 97 08/27/2022   CHOLHDL 3.4 08/27/2022    Significant Diagnostic Results in last 30 days:  MR Lumbar Spine W Wo Contrast  Result Date: 09/01/2022 CLINICAL DATA:  Initial evaluation for back pain. EXAM: MRI LUMBAR SPINE WITHOUT AND WITH CONTRAST TECHNIQUE: Multiplanar and multiecho pulse sequences of the lumbar spine were obtained without and with intravenous contrast. CONTRAST:  6mL GADAVIST GADOBUTROL 1 MMOL/ML IV SOLN COMPARISON:  Prior study from 08/22/2022. FINDINGS: Segmentation: Standard. Lowest well-formed disc space labeled the L5-S1 level. Alignment: Sigmoid scoliotic curvature of the lumbar spine. 3 mm anterolisthesis of L4 on L5, with 5 mm anterolisthesis of L5 on S1. Findings chronic and facet mediated. Vertebrae: Vertebral body height maintained without acute or chronic fracture. Bone marrow signal  intensity within normal limits. No discrete or worrisome osseous lesions. Reactive marrow edema and enhancement present about the left L4-5 facet, most likely due to degenerative facet arthritis. No other abnormal marrow edema or enhancement. Conus medullaris and cauda equina: Conus extends to the L2 level. Conus and cauda equina appear normal. Paraspinal and other soft tissues: Prominent edema and enhancement seen within the left lower posterior paraspinous soft tissues adjacent to the left L4-5 facet (series 10, image 12). Finding is most likely due to adjacent facet arthritis. No drainable fluid collections. Paraspinous soft tissues demonstrate no other acute finding. Aortic atherosclerosis with 3.6 cm infrarenal aortic aneurysm. Few scattered subcentimeter T2 hyperintense cyst noted about the visualized right kidney, likely benign, with no follow-up imaging recommended. Asymmetric left renal atrophy noted. 1 cm benign appearing cyst noted within the spleen. Disc levels: T12-L1: Disc desiccation with mild disc bulge  and endplate spurring. Mild facet hypertrophy. No stenosis. L1-2: Negative interspace. Mild facet hypertrophy. No canal or foraminal stenosis. L2-3: Degenerative intervertebral disc space narrowing with diffuse disc bulge, slightly asymmetric to the right. Mild bilateral facet hypertrophy. No spinal stenosis. Foramina remain adequately patent. L3-4: Degenerative intervertebral disc space narrowing with diffuse disc bulge and disc desiccation. Associated reactive endplate change with marginal endplate osteophytic spurring. Moderate left with mild right facet arthrosis. Resultant mild canal with left lateral recess stenosis. Mild left L3 foraminal narrowing. Right neural foramina remains patent. L4-5: Disc desiccation with diffuse disc bulge. Superimposed small central disc protrusion with slight inferior migration. Moderate left greater than right facet arthrosis with associated small joint effusions.  Reactive marrow edema and enhancement on the left. Resultant mild canal with bilateral subarticular stenosis. Mild bilateral L4 foraminal narrowing. L5-S1: Anterolisthesis. Diffuse disc bulge with disc desiccation. Severe right with moderate left facet arthrosis. Resultant mild narrowing of the right lateral recess. Central canal remains patent. Mild to moderate right L5 foraminal stenosis. Left neural foramina remains patent. IMPRESSION: 1. Reactive edema and enhancement about the left L4-5 facet, most likely due to degenerative facet arthritis. Although possible infection with septic arthritis is felt to be unlikely, correlation with history and laboratory values is recommended. 2. No other acute abnormality within the lumbar spine. 3. Multifactorial degenerative changes at L3-4 through L5-S1 with resultant mild canal with bilateral lateral recess stenosis as above. 4. 3.6 cm infrarenal aortic aneurysm. Recommend follow-up ultrasound every 2 years. This recommendation follows ACR consensus guidelines: White Paper of the ACR Incidental Findings Committee II on Vascular Findings. J Am Coll Radiol 2013; 10:789-794. Aortic Atherosclerosis (ICD10-I70.0). Electronically Signed   By: Rise Mu M.D.   On: 09/01/2022 05:23   ECHOCARDIOGRAM COMPLETE  Result Date: 08/27/2022    ECHOCARDIOGRAM REPORT   Patient Name:   JAYCE LOAFMAN Date of Exam: 08/27/2022 Medical Rec #:  [id number]     Height:       64.0 in Accession #:    [id number]    Weight:       132.3 lb Date of Birth:  [birth year]    BSA:          1.641 m Patient Age:    89 years      BP:           136/66 mmHg Patient Gender: F             HR:           67 bpm. Exam Location:  [facility] Procedure: 2D Echo, Cardiac Doppler and Color Doppler Indications:     Elevated Troponin  History:         Patient has no prior history of Echocardiogram examinations.                  CHF, Acute MI, Signs/Symptoms:Chest Pain,                  Dizziness/Lightheadedness and  Fatigue; Risk                  Factors:Hypertension and Diabetes. Breast CA.  Sonographer:     Mikki Harbor Referring Phys:  [id number] [facility] MICHELLE TANG Diagnosing Phys: Marcina Millard MD IMPRESSIONS  1. Left ventricular ejection fraction, by estimation, is 60 to 65%. The left ventricle has normal function. The left ventricle has no regional wall motion abnormalities. Left ventricular diastolic parameters are consistent with Grade I diastolic dysfunction (impaired relaxation).  2. Right  ventricular systolic function is normal. The right ventricular size is normal. There is moderately elevated pulmonary artery systolic pressure.  3. Left atrial size was mild to moderately dilated.  4. Right atrial size was mild to moderately dilated.  5. The mitral valve is normal in structure. Moderate to severe mitral valve regurgitation. No evidence of mitral stenosis.  6. Tricuspid valve regurgitation is moderate to severe.  7. The aortic valve is normal in structure. Aortic valve regurgitation is mild. No aortic stenosis is present.  8. The inferior vena cava is normal in size with greater than 50% respiratory variability, suggesting right atrial pressure of 3 mmHg. FINDINGS  Left Ventricle: Left ventricular ejection fraction, by estimation, is 60 to 65%. The left ventricle has normal function. The left ventricle has no regional wall motion abnormalities. The left ventricular internal cavity size was normal in size. There is  no left ventricular hypertrophy. Left ventricular diastolic parameters are consistent with Grade I diastolic dysfunction (impaired relaxation). Right Ventricle: The right ventricular size is normal. No increase in right ventricular wall thickness. Right ventricular systolic function is normal. There is moderately elevated pulmonary artery systolic pressure. The tricuspid regurgitant velocity is 3.60 m/s, and with an assumed right atrial pressure of 8 mmHg, the estimated right ventricular systolic  pressure is 59.8 mmHg. Left Atrium: Left atrial size was mild to moderately dilated. Right Atrium: Right atrial size was mild to moderately dilated. Pericardium: There is no evidence of pericardial effusion. Mitral Valve: The mitral valve is normal in structure. Moderate to severe mitral valve regurgitation. No evidence of mitral valve stenosis. MV peak gradient, 4.8 mmHg. The mean mitral valve gradient is 1.0 mmHg. Tricuspid Valve: The tricuspid valve is normal in structure. Tricuspid valve regurgitation is moderate to severe. No evidence of tricuspid stenosis. Aortic Valve: The aortic valve is normal in structure. Aortic valve regurgitation is mild. No aortic stenosis is present. Aortic valve mean gradient measures 4.0 mmHg. Aortic valve peak gradient measures 10.1 mmHg. Aortic valve area, by VTI measures 2.75  cm. Pulmonic Valve: The pulmonic valve was normal in structure. Pulmonic valve regurgitation is not visualized. No evidence of pulmonic stenosis. Aorta: The aortic root is normal in size and structure. Venous: The inferior vena cava is normal in size with greater than 50% respiratory variability, suggesting right atrial pressure of 3 mmHg. IAS/Shunts: No atrial level shunt detected by color flow Doppler.  LEFT VENTRICLE PLAX 2D LVIDd:         4.50 cm   Diastology LVIDs:         2.80 cm   LV e' medial:    6.42 cm/s LV PW:         1.00 cm   LV E/e' medial:  17.0 LV IVS:        1.30 cm   LV e' lateral:   8.70 cm/s LVOT diam:     2.10 cm   LV E/e' lateral: 12.5 LV SV:         86 LV SV Index:   52 LVOT Area:     3.46 cm  RIGHT VENTRICLE RV Basal diam:  3.65 cm RV Mid diam:    2.70 cm RV S prime:     8.92 cm/s TAPSE (M-mode): 2.0 cm LEFT ATRIUM              Index        RIGHT ATRIUM           Index LA diam:  4.10 cm  2.50 cm/m   RA Area:     19.20 cm LA Vol (A2C):   115.0 ml 70.07 ml/m  RA Volume:   50.40 ml  30.71 ml/m LA Vol (A4C):   61.7 ml  37.60 ml/m LA Biplane Vol: 85.5 ml  52.10 ml/m  AORTIC  VALVE                    PULMONIC VALVE AV Area (Vmax):    2.03 cm     PV Vmax:       0.90 m/s AV Area (Vmean):   2.40 cm     PV Peak grad:  3.2 mmHg AV Area (VTI):     2.75 cm AV Vmax:           159.00 cm/s AV Vmean:          91.500 cm/s AV VTI:            0.311 m AV Peak Grad:      10.1 mmHg AV Mean Grad:      4.0 mmHg LVOT Vmax:         93.40 cm/s LVOT Vmean:        63.300 cm/s LVOT VTI:          0.247 m LVOT/AV VTI ratio: 0.79  AORTA Ao Root diam: 3.30 cm Ao Asc diam:  4.10 cm MITRAL VALVE                TRICUSPID VALVE MV Area (PHT): 4.65 cm     TR Peak grad:   51.8 mmHg MV Area VTI:   2.77 cm     TR Vmax:        360.00 cm/s MV Peak grad:  4.8 mmHg MV Mean grad:  1.0 mmHg     SHUNTS MV Vmax:       1.10 m/s     Systemic VTI:  0.25 m MV Vmean:      54.4 cm/s    Systemic Diam: 2.10 cm MV Decel Time: 163 msec MV E velocity: 109.00 cm/s MV A velocity: 77.00 cm/s MV E/A ratio:  1.42 Marcina Millard MD Electronically signed by Marcina Millard MD Signature Date/Time: 08/27/2022/1:16:52 PM    Final    US Venous Img Lower Bilateral (DVT)  Result Date: 08/26/2022 CLINICAL DATA:  [id number] Positive D dimer [id number]. EXAM: BILATERAL LOWER EXTREMITY VENOUS DOPPLER ULTRASOUND TECHNIQUE: Gray-scale sonography with compression, as well as color and duplex ultrasound, were performed to evaluate the deep venous system(s) from the level of the common femoral vein through the popliteal and proximal calf veins. COMPARISON:  None Available. FINDINGS: VENOUS Normal compressibility of the common femoral, superficial femoral, and popliteal veins, as well as the visualized calf veins. Visualized portions of profunda femoral vein and great saphenous vein unremarkable. No filling defects to suggest DVT on grayscale or color Doppler imaging. Doppler waveforms show normal direction of venous flow, normal respiratory plasticity and response to augmentation. OTHER Cyst in the left popliteal fossa measuring up to 5 cm, likely Baker's  cyst. Limitations: none IMPRESSION: 1. No lower extremity DVT. 2. 5 cm left Baker's cyst. Electronically Signed   By: Orvan Falconer M.D.   On: 08/26/2022 15:45   NM Pulmonary Perfusion  Result Date: 08/26/2022 CLINICAL DATA:  Pulmonary embolus suspected EXAM: NUCLEAR MEDICINE PERFUSION LUNG SCAN TECHNIQUE: Perfusion images were obtained in multiple projections after intravenous injection of radiopharmaceutical. Ventilation scans intentionally deferred if perfusion scan and chest x-ray adequate  for interpretation during COVID 19 epidemic. RADIOPHARMACEUTICALS:  4.2 mCi Tc-66m MAA IV COMPARISON:  None Available. FINDINGS: Bilateral mild-to-moderate perfusion defects. No large perfusion defects. IMPRESSION: Nondiagnostic (intermediate probability). Correlate with lower extremity ultrasound and consider chest CTA for further evaluation. Electronically Signed   By: Allegra Lai M.D.   On: 08/26/2022 15:10   DG Chest Port 1 View  Result Date: 08/26/2022 CLINICAL DATA:  89 year old female with history of hematemesis and generalized weakness. EXAM: PORTABLE CHEST 1 VIEW COMPARISON:  Chest x-ray 06/07/2022. FINDINGS: Lung volumes are low. Bibasilar opacities favored to reflect areas of subsegmental atelectasis. Diffuse interstitial prominence and widespread peribronchial cuffing. No definite pleural effusions. No pneumothorax. No evidence of pulmonary edema. Heart size is normal. Upper mediastinal contours are within normal limits allowing for patient positioning. Atherosclerotic calcifications in the thoracic aorta. IMPRESSION: 1. The appearance of the chest is concerning for probable acute bronchitis, as above. Some degree of underlying interstitial lung disease is also suspected. 2. Aortic atherosclerosis. Electronically Signed   By: Trudie Reed M.D.   On: 08/26/2022 05:51   CT Angio Chest/Abd/Pel for Dissection W and/or Wo Contrast  Result Date: 08/22/2022 CLINICAL DATA:  Vomiting and diarrhea.  Concern for acute aortic syndrome. History of breast cancer. EXAM: CT ANGIOGRAPHY CHEST, ABDOMEN AND PELVIS TECHNIQUE: Non-contrast CT of the chest was initially obtained. Multidetector CT imaging through the chest, abdomen and pelvis was performed using the standard protocol during bolus administration of intravenous contrast. Multiplanar reconstructed images and MIPs were obtained and reviewed to evaluate the vascular anatomy. RADIATION DOSE REDUCTION: This exam was performed according to the departmental dose-optimization program which includes automated exposure control, adjustment of the mA and/or kV according to patient size and/or use of iterative reconstruction technique. CONTRAST:  80mL OMNIPAQUE IOHEXOL 350 MG/ML SOLN COMPARISON:  CT chest dated June 08, 2022. CT abdomen pelvis dated March 18, 2022. FINDINGS: CTA CHEST FINDINGS Cardiovascular: Preferential opacification of the thoracic aorta. No evidence of thoracic aortic aneurysm or dissection. No intramural hematoma. Coronary, aortic arch, and branch vessel atherosclerotic vascular disease. Normal heart size. No pericardial effusion. No pulmonary embolism. Mediastinum/Nodes: No enlarged mediastinal, hilar, or axillary lymph nodes. Thyroid gland, trachea, and esophagus demonstrate no significant findings. Lungs/Pleura: Bibasilar scarring. No focal consolidation, pleural effusion, or pneumothorax. Musculoskeletal: Status post bilateral mastectomies. No acute or significant osseous findings. Review of the MIP images confirms the above findings. CTA ABDOMEN AND PELVIS FINDINGS VASCULAR Aorta: Unchanged infrarenal abdominal aortic aneurysm measuring up to 3.6 cm in diameter. No dissection, vasculitis or significant stenosis. Calcified and noncalcified atherosclerotic plaque. 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 Appearance is suggestive of median arcuate ligament compression.  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 Renals: Single renal arteries bilaterally. Chronic severe stenosis of the distal left renal artery with severe left renal atrophy. Patent right proximal renal artery stent. IMA: Patent without evidence of aneurysm, dissection, vasculitis or significant stenosis. Inflow: Patent without evidence of aneurysm, dissection, vasculitis or significant stenosis. Veins: No obvious venous abnormality within the limitations of this arterial phase study. Review of the MIP images confirms the above findings. NON-VASCULAR Hepatobiliary: No focal liver abnormality is seen. No gallstones, gallbladder wall thickening, or biliary dilatation. Pancreas: No ductal dilatation or surrounding inflammatory changes. Multiple cystic pancreatic lesions measuring up to 2.6 cm, previously 2.5 cm. Spleen: Normal in size without focal abnormality. Adrenals/Urinary Tract: Adrenal glands are unremarkable. Chronic severe left renal  atrophy. No renal calculi or hydronephrosis. Bladder is unremarkable. Stomach/Bowel: Unchanged small hiatal hernia. No bowel wall thickening, distention, or surrounding inflammatory changes. History of prior appendectomy. Lymphatic: No enlarged abdominal or pelvic lymph nodes. Reproductive: Status post hysterectomy. No adnexal masses. Other: No free fluid or pneumoperitoneum. Musculoskeletal: No acute or significant osseous findings. Review of the MIP images confirms the above findings. IMPRESSION: 1. No evidence of acute aortic syndrome. 2. Unchanged infrarenal abdominal aortic aneurysm measuring up to 3.6 cm in diameter. Recommend follow-up ultrasound every 2 years. 3. 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 No evidence of bowel ischemia. 4. Multiple cystic pancreatic lesions measuring up to 2.6 cm. Given the patient's age, no routine follow-up is recommended. 5.  Aortic atherosclerosis (ICD10-I70.0). Electronically Signed   By: Obie Dredge M.D.   On:  08/22/2022 13:34   CT HEAD WO CONTRAST ( )  Result Date: 08/22/2022 CLINICAL DATA:  Acute onset of severe headache. EXAM: CT HEAD WITHOUT CONTRAST TECHNIQUE: Contiguous axial images were obtained from the base of the skull through the vertex without intravenous contrast. RADIATION DOSE REDUCTION: This exam was performed according to the departmental dose-optimization program which includes automated exposure control, adjustment of the mA and/or kV according to patient size and/or use of iterative reconstruction technique. COMPARISON:  06/05/2022 FINDINGS: Brain: No evidence of intracranial hemorrhage, acute infarction, hydrocephalus, extra-axial collection, or mass lesion/mass effect. Mild cerebral atrophy and chronic small vessel disease again noted. Vascular:  No hyperdense vessel or other acute findings. Skull: No evidence of fracture or other significant bone abnormality. Sinuses/Orbits:  No acute findings. Other: None. IMPRESSION: No acute intracranial abnormality. Stable mild cerebral atrophy and chronic small vessel disease. Electronically Signed   By: Danae Orleans M.D.   On: 08/22/2022 13:22   DG Bone Density  Result Date: 08/20/2022 EXAM: DUAL X-RAY ABSORPTIOMETRY (DXA) FOR BONE MINERAL DENSITY IMPRESSION: Your patient Alectra Gellman completed a BMD test on 08/20/2022 using the [employer] DXA System (software version: 14.10) manufactured by [employer]. The following summarizes the results of our evaluation. Technologist: [facility] PATIENT BIOGRAPHICAL: Name: Joleth, Julich Patient ID: [id number] Birth Date: 04/15/1927 Height: 64.0 in. Gender: Female Exam Date: 08/20/2022 Weight: 135.1 lbs. Indications: History of Breast Cancer, Advanced Age, Hysterectomy, Caucasian, History of Radiation, Oophorectomy Bilateral, Postmenopausal Fractures: Treatments: Anastrozole, Calcium, Prolia, Vitamin D DENSITOMETRY RESULTS: Site         Region     Measured Date Measured Age WHO Classification Young Adult  T-score BMD         %Change vs. Previous Significant Change (*) DualFemur Neck Right 08/20/2022 93.2 Osteoporosis -2.7 0.669 g/cm2 2.3% - DualFemur Neck Right 03/07/2020 90.8 Osteoporosis -2.8 0.654 g/cm2 - - DualFemur Total Mean 08/20/2022 93.2 Osteoporosis -2.6 0.685 g/cm2 1.3% - DualFemur Total Mean 03/07/2020 90.8 Osteoporosis -2.6 0.676 g/cm2 - - Left Forearm Radius 33% 08/20/2022 93.2 Osteoporosis -3.1 0.604 g/cm2 10.2% Yes Left Forearm Radius 33% 03/07/2020 90.8 Osteoporosis -3.7 0.548 g/cm2 - - ASSESSMENT: The BMD measured at Forearm Radius 33% is 0.604 g/cm2 with a T-score of -3.1. This patient is considered osteoporotic according to World Health Organization [facility]) criteria. The scan quality is good. Lumbar spine was not utilized due to advanced degenerative changes. Compared with prior study, there has been no significant change in the total hip. World Health Organization [facility]) criteria for post-menopausal, Caucasian Women: Normal:                   T-score at or  above -1 SD Osteopenia/low bone mass: T-score between -1 and -2.5 SD Osteoporosis:             T-score at or below -2.5 SD RECOMMENDATIONS: 1. All patients should optimize calcium and vitamin D intake. 2. Consider FDA-approved medical therapies in postmenopausal women and men aged 57 years and older, based on the following: a. A hip or vertebral(clinical or morphometric) fracture b. T-score < -2.5 at the femoral neck or spine after appropriate evaluation to exclude secondary causes c. Low bone mass (T-score between -1.0 and -2.5 at the femoral neck or spine) and a 10-year probability of a hip fracture > 3% or a 10-year probability of a major osteoporosis-related fracture > 20% based on the [place]-adapted WHO algorithm 3. Clinician judgment and/or patient preferences may indicate treatment for people with 10-year fracture probabilities above or below these levels FOLLOW-UP: People with diagnosed cases of osteoporosis or at high risk for fracture should  have regular bone mineral density tests. For patients eligible for Medicare, routine testing is allowed once every 2 years. The testing frequency can be increased to one year for patients who have rapidly progressing disease, those who are receiving or discontinuing medical therapy to restore bone mass, or have additional risk factors. I have reviewed this report, and agree with the above findings. [facility] Electronically Signed   By: Gerome Sam III M.D.   On: 08/20/2022 17:57    Assessment/Plan 1. Hypocalcemia -on supplement at this time, will have staff update BMP prior to discharge.  2. Hypomagnesemia -currently on supplement, will get lab prior to discharge  3. Sepsis due to Streptococcus species with acute hypoxic respiratory failure without septic shock (HCC) Resolved, completed IV antibiotics and PICC line has been removed.   4. Secondary hyperparathyroidism of renal origin [facility]) -has appt with nephrologist tomorrow for ongoing monitoring.   5. Type 2 diabetes mellitus with stage 3b chronic kidney disease, without long-term current use of insulin (HCC) -A1c 7, continue home regimen.   6. Renovascular hypertension -Blood pressure well controlled, goal bp <140/90 Continue current medications and dietary modifications  7. Coronary artery disease involving native coronary artery of native heart without angina pectoris Without chest pains, continue current regimen   8. Polypharmacy Would recommend discussion with PCP for [facility] on some of her medication if able.   pt is stable for discharge-will need PT/OT and will get at [facility]. DME needed includes none. Pt reports she does not need Rx sent with her, staff will give her remaining supply at facility as well.   Janene Harvey. Biagio Borg [facility] [phone]

## 2022-09-23 ENCOUNTER — Encounter: Payer: Self-pay | Admitting: Student

## 2022-09-23 ENCOUNTER — Ambulatory Visit: Payer: Medicare PPO | Admitting: Student

## 2022-09-23 VITALS — BP 134/82 | HR 79 | Temp 97.9°F | Ht 64.0 in | Wt 130.0 lb

## 2022-09-23 DIAGNOSIS — R197 Diarrhea, unspecified: Secondary | ICD-10-CM

## 2022-09-23 NOTE — Progress Notes (Signed)
[facility] clinic [facility] ([facility]).   Provider: Dr. Earnestine Mealing  Code Status: DNR Goals of Care:     09/23/2022    1:35 PM  Advanced Directives  Does Patient Have a Medical Advance Directive? Yes  Type of Advance Directive Out of facility DNR (pink MOST or yellow form)  Does patient want to make changes to medical advance directive? No - Patient declined     Chief Complaint  Patient presents with   Acute Visit    Complains of Diarrhea and weakness for about 1 month.     HPI: Patient is a 89 y.o. female seen today for an acute visit for Diarrhea and Weakness for about 1 month.   She has gone to the bathroom 2-3x. It's not complete liquid but it's very soft and she can't control it.   Her appetite is poor. She typically gets her food from the hearth. Last night she had chicken, kale, green beans,   Yesterday she only had loose stool one time, but it's like oatmeal and seems to happen on it's own.   She can't have more than one high fiber food in 1 meal.   She doesn't have diarrhea after every meal. She felt like she was  having issues while she had folks in her house helping install bathroom bars and  Past Medical History:  Diagnosis Date   Arthritis    Gout   Chronic kidney disease    Diarrhea    Hypercholesteremia    Hypertension    Neuropathy    feet and lower legs   Seasonal allergies    Skin cancer    face    Past Surgical History:  Procedure Laterality Date   ABDOMINAL HYSTERECTOMY  2000   APPENDECTOMY     BREAST BIOPSY Right 09/19/2019   affirm bx of calcs UOQ, x marker, path pending   BREAST BIOPSY Right 09/19/2019   [place] bx of mass,heart marker, path pending   BREAST BIOPSY Right 09/19/2019   [place] bx of LN, coil marker, path pending   CATARACT EXTRACTION Left    CATARACT EXTRACTION W/PHACO Right 10/24/2014   Procedure: CATARACT EXTRACTION PHACO AND INTRAOCULAR LENS PLACEMENT (IOC);  Surgeon: Lockie Mola, MD;  Location: [facility];  Service:  Ophthalmology;  Laterality: Right;   ESOPHAGOGASTRODUODENOSCOPY N/A 03/07/2018   Procedure: ESOPHAGOGASTRODUODENOSCOPY (EGD);  Surgeon: Toney Reil, MD;  Location: [facility] END[facility]COPY;  Service: Gastroenterology;  Laterality: N/A;   MASTECTOMY MODIFIED RADICAL Right 10/13/2019   Procedure: MASTECTOMY MODIFIED RADICAL;  Surgeon: Earline Mayotte, MD;  Location: [facility] ORS;  Service: General;  Laterality: Right;   RENAL ANGIOGRAPHY Right 04/11/2018   Procedure: RENAL ANGIOGRAPHY;  Surgeon: Annice Needy, MD;  Location: [facility] INVASIVE CV LAB;  Service: Cardiovascular;  Laterality: Right;   SIMPLE MASTECTOMY WITH AXILLARY SENTINEL NODE BIOPSY Left 10/13/2019   Procedure: SIMPLE MASTECTOMY TRUE CUT BIOPSY, SENTINEL NODE BIOPSY;  Surgeon: Earline Mayotte, MD;  Location: [facility] ORS;  Service: General;  Laterality: Left;    Allergies  Allergen Reactions   Penicillins Anaphylaxis   Drug Ingredient [Zinc]    Empagliflozin Rash    Outpatient Encounter Medications as of 09/23/2022  Medication Sig   albuterol (VENTOLIN HFA) 108 (90 Base) MCG/ACT inhaler Inhale 2 puffs into the lungs every 6 (six) hours as needed for wheezing or shortness of breath.   anastrozole (ARIMIDEX) 1 MG tablet TAKE 1 TABLET(1 MG) BY MOUTH DAILY. DO NOT. START IF RASH IS NOT BETTER  aspirin 81 MG EC tablet Take 81 mg by mouth daily.   calcitRIOL (ROCALTROL) 0.25 MCG capsule Take 0.25 mcg by mouth every other day.   calcium carbonate (OS-CAL - DOSED IN MG OF ELEMENTAL CALCIUM) 1250 (500 Ca) MG tablet Take 2 tablets by mouth 4 (four) times daily.   citalopram (CELEXA) 10 MG tablet Take 10 mg by mouth daily.    clopidogrel (PLAVIX) 75 MG tablet TAKE 1 TABLET BY MOUTH EVERY DAY   cyanocobalamin (VITAMIN B12) 1000 MCG tablet Take 1,000 mcg by mouth daily.   docusate sodium (COLACE) 100 MG capsule Take 100 mg by mouth daily as needed for mild constipation.   DULoxetine (CYMBALTA) 60 MG capsule Take 60 mg by mouth daily.    furosemide  (LASIX) 40 MG tablet Take 1 tablet (40 mg total) by mouth daily.   gabapentin (NEURONTIN) 300 MG capsule Take 300 mg by mouth at bedtime.   hydrALAZINE (APRESOLINE) 25 MG tablet Take 25 mg by mouth 3 (three) times daily.   isosorbide mononitrate (IMDUR) 30 MG 24 hr tablet Take 1 tablet (30 mg total) by mouth daily.   lidocaine (LIDODERM) 5 % Place 1 patch onto the skin daily. Remove & Discard patch within 12 hours or as directed by MD   loratadine (CLARITIN) 10 MG tablet Take 10 mg by mouth daily as needed for allergies. AM   Magnesium 400 MG TABS Take 800 mg by mouth in the morning and at bedtime.   metoprolol succinate (TOPROL-XL) 25 MG 24 hr tablet Take 37.5 mg by mouth at bedtime.    Multiple Vitamins-Minerals (ICAPS AREDS 2 PO) Take 2 capsules by mouth daily.   multivitamin (PROSIGHT) TABS tablet Take 1 tablet by mouth daily.   OLANZapine (ZYPREXA) 2.5 MG tablet Take 2.5 mg by mouth at bedtime.   pantoprazole (PROTONIX) 40 MG tablet Take 1 tablet (40 mg total) by mouth daily.   simvastatin (ZOCOR) 20 MG tablet Take 20 mg by mouth every evening. PM   traMADol (ULTRAM) 50 MG tablet Take 1 tablet (50 mg total) by mouth every 12 (twelve) hours as needed for moderate pain.   triamcinolone cream (KENALOG) 0.1 % Apply 1 Application topically 2 (two) times daily.   Vitamin D, Ergocalciferol, (DRISDOL) 1.25 MG (50000 UNIT) CAPS capsule Take 50,000 Units by mouth every 7 (seven) days.   Zinc Oxide (TRIPLE PASTE) 12.8 % ointment Apply 1 Application topically as directed. Apply to buttocks every shift for skin protection   No facility-administered encounter medications on file as of 09/23/2022.    Review of Systems:  Review of Systems  Health Maintenance  Topic Date Due   Medicare Annual Wellness (AWV)  Never done   FOOT EXAM  Never done   OPHTHALMOLOGY EXAM  Never done   Zoster Vaccines- Shingrix (1 of 2) Never done   DTaP/Tdap/Td (3 - Td or Tdap) 08/06/2020   COVID-19 Vaccine (5 - 2023-24  season) 04/23/2022   INFLUENZA VACCINE  12/10/2022   HEMOGLOBIN A1C  02/25/2023   Pneumonia Vaccine 79+ Years old  Completed   DEXA SCAN  Completed   HPV VACCINES  Aged Out    Physical Exam: Vitals:   09/23/22 1333  BP: 134/82  Pulse: 79  Temp: 97.9 F (36.6 C)  SpO2: 94%  Weight: 130 lb (59 kg)  Height: 5\' 4"  (1.626 m)   Body mass index is 22.31 kg/m. Physical Exam Constitutional:      Comments: Thin with temporal muscle wasting  Cardiovascular:  Rate and Rhythm: Normal rate.     Pulses: Normal pulses.  Pulmonary:     Effort: Pulmonary effort is normal.  Genitourinary:    Comments: Normal anal sphinter wink. Neurological:     Mental Status: She is alert and oriented to person, place, and time.     Labs reviewed: Basic Metabolic Panel: Recent Labs    08/30/22 0437 08/30/22 1751 08/31/22 0526 09/01/22 0529 09/02/22 0745 09/03/22 0000 09/08/22 0913 09/09/22 0000 09/11/22 0000  NA 134*  --  134* 133* 136   < > 131* 131* 135*  K 3.0* 3.4* 3.6 3.2* 3.7   < > 3.7 4.5 4.5  CL 105  --  106 104 108   < > 101 102 99  CO2 19*  --  20* 21* 21*   < > 23 22 24*  GLUCOSE 102*  --  111* 113* 110*  --  154*  --   --   BUN 28*  --  21 18 21    < > 19 15 23*  CREATININE 1.76* 1.78* 1.55* 1.55* 1.64*   < > 1.41* 1.3* 1.2*  CALCIUM 6.8*  --  7.2* 7.0* 6.6*   < > 7.6* 8.1* 8.8  MG 2.0  --  1.9  --   --   --  1.5*  --   --   PHOS 1.7* 2.2* 1.5* 1.9*  --   --   --   --   --    < > = values in this interval not displayed.   Liver Function Tests: Recent Labs    08/22/22 1110 08/26/22 0526 08/28/22 0214 09/03/22 0000 09/08/22 0913 09/09/22 0000 09/11/22 0000  AST 50* 56*  --  46* 62* 59* 56*  ALT 22 35  --  24 34 34 27  ALKPHOS 93 72  --  91 111  --  130*  BILITOT 0.6 1.1  --   --  0.6  --   --   PROT 7.7 7.4  --   --  6.8  --   --   ALBUMIN 4.0 3.9   < > 2.7* 2.7* 3.2* 3.1*   < > = values in this interval not displayed.   Recent Labs    03/18/22 1742  08/22/22 1110 08/26/22 0526  LIPASE 58* 29 26   No results for input(s): "AMMONIA" in the last 8760 hours. CBC: Recent Labs    08/26/22 0526 08/26/22 0754 08/28/22 0214 08/29/22 0523 09/03/22 0000 09/08/22 0913 09/09/22 0000  WBC 6.4   < > 14.1* 13.1* 11.6 7.6 7.5  NEUTROABS 5.3  --   --   --  8,352.00  --  4,343.00  HGB 11.9*   < > 10.0* 10.1* 8.9* 9.5* 10.4*  HCT 35.1*   < > 29.0* 29.7* 27* 29.5* 32*  MCV 92.1   < > 90.1 90.3  --  93.9  --   PLT 176   < > 165 194 322 423* 460*   < > = values in this interval not displayed.   Lipid Panel: Recent Labs    08/26/22 1200 08/27/22 0426  CHOL 97 75  HDL 27* 22*  LDLCALC 41 34  TRIG 147 97  CHOLHDL 3.6 3.4   Lab Results  Component Value Date   HGBA1C 7.0 (H) 08/26/2022    Procedures since last visit: MR Lumbar Spine W Wo Contrast  Result Date: 09/01/2022 CLINICAL DATA:  Initial evaluation for back pain. EXAM: MRI LUMBAR SPINE WITHOUT  AND WITH CONTRAST TECHNIQUE: Multiplanar and multiecho pulse sequences of the lumbar spine were obtained without and with intravenous contrast. CONTRAST:  6mL GADAVIST GADOBUTROL 1 MMOL/ML IV SOLN COMPARISON:  Prior study from 08/22/2022. FINDINGS: Segmentation: Standard. Lowest well-formed disc space labeled the L5-S1 level. Alignment: Sigmoid scoliotic curvature of the lumbar spine. 3 mm anterolisthesis of L4 on L5, with 5 mm anterolisthesis of L5 on S1. Findings chronic and facet mediated. Vertebrae: Vertebral body height maintained without acute or chronic fracture. Bone marrow signal intensity within normal limits. No discrete or worrisome osseous lesions. Reactive marrow edema and enhancement present about the left L4-5 facet, most likely due to degenerative facet arthritis. No other abnormal marrow edema or enhancement. Conus medullaris and cauda equina: Conus extends to the L2 level. Conus and cauda equina appear normal. Paraspinal and other soft tissues: Prominent edema and enhancement seen  within the left lower posterior paraspinous soft tissues adjacent to the left L4-5 facet (series 10, image 12). Finding is most likely due to adjacent facet arthritis. No drainable fluid collections. Paraspinous soft tissues demonstrate no other acute finding. Aortic atherosclerosis with 3.6 cm infrarenal aortic aneurysm. Few scattered subcentimeter T2 hyperintense cyst noted about the visualized right kidney, likely benign, with no follow-up imaging recommended. Asymmetric left renal atrophy noted. 1 cm benign appearing cyst noted within the spleen. Disc levels: T12-L1: Disc desiccation with mild disc bulge and endplate spurring. Mild facet hypertrophy. No stenosis. L1-2: Negative interspace. Mild facet hypertrophy. No canal or foraminal stenosis. L2-3: Degenerative intervertebral disc space narrowing with diffuse disc bulge, slightly asymmetric to the right. Mild bilateral facet hypertrophy. No spinal stenosis. Foramina remain adequately patent. L3-4: Degenerative intervertebral disc space narrowing with diffuse disc bulge and disc desiccation. Associated reactive endplate change with marginal endplate osteophytic spurring. Moderate left with mild right facet arthrosis. Resultant mild canal with left lateral recess stenosis. Mild left L3 foraminal narrowing. Right neural foramina remains patent. L4-5: Disc desiccation with diffuse disc bulge. Superimposed small central disc protrusion with slight inferior migration. Moderate left greater than right facet arthrosis with associated small joint effusions. Reactive marrow edema and enhancement on the left. Resultant mild canal with bilateral subarticular stenosis. Mild bilateral L4 foraminal narrowing. L5-S1: Anterolisthesis. Diffuse disc bulge with disc desiccation. Severe right with moderate left facet arthrosis. Resultant mild narrowing of the right lateral recess. Central canal remains patent. Mild to moderate right L5 foraminal stenosis. Left neural foramina  remains patent. IMPRESSION: 1. Reactive edema and enhancement about the left L4-5 facet, most likely due to degenerative facet arthritis. Although possible infection with septic arthritis is felt to be unlikely, correlation with history and laboratory values is recommended. 2. No other acute abnormality within the lumbar spine. 3. Multifactorial degenerative changes at L3-4 through L5-S1 with resultant mild canal with bilateral lateral recess stenosis as above. 4. 3.6 cm infrarenal aortic aneurysm. Recommend follow-up ultrasound every 2 years. This recommendation follows ACR consensus guidelines: White Paper of the ACR Incidental Findings Committee II on Vascular Findings. J Am Coll Radiol 2013; 10:789-794. Aortic Atherosclerosis (ICD10-I70.0). Electronically Signed   By: Rise Mu M.D.   On: 09/01/2022 05:23   ECHOCARDIOGRAM COMPLETE  Result Date: 08/27/2022    ECHOCARDIOGRAM REPORT   Patient Name:   ELIYANA SPALLONE Date of Exam: 08/27/2022 Medical Rec #:  [id number]     Height:       64.0 in Accession #:    [id number]    Weight:       132.3 lb Date  of Birth:  04/15/1927    BSA:          1.641 m Patient Age:    89 years      BP:           136/66 mmHg Patient Gender: F             HR:           67 bpm. Exam Location:  [facility] Procedure: 2D Echo, Cardiac Doppler and Color Doppler Indications:     Elevated Troponin  History:         Patient has no prior history of Echocardiogram examinations.                  CHF, Acute MI, Signs/Symptoms:Chest Pain,                  Dizziness/Lightheadedness and Fatigue; Risk                  Factors:Hypertension and Diabetes. Breast CA.  Sonographer:     Mikki Harbor Referring Phys:  [id number] [facility] MICHELLE TANG Diagnosing Phys: Marcina Millard MD IMPRESSIONS  1. Left ventricular ejection fraction, by estimation, is 60 to 65%. The left ventricle has normal function. The left ventricle has no regional wall motion abnormalities. Left ventricular diastolic parameters are  consistent with Grade I diastolic dysfunction (impaired relaxation).  2. Right ventricular systolic function is normal. The right ventricular size is normal. There is moderately elevated pulmonary artery systolic pressure.  3. Left atrial size was mild to moderately dilated.  4. Right atrial size was mild to moderately dilated.  5. The mitral valve is normal in structure. Moderate to severe mitral valve regurgitation. No evidence of mitral stenosis.  6. Tricuspid valve regurgitation is moderate to severe.  7. The aortic valve is normal in structure. Aortic valve regurgitation is mild. No aortic stenosis is present.  8. The inferior vena cava is normal in size with greater than 50% respiratory variability, suggesting right atrial pressure of 3 mmHg. FINDINGS  Left Ventricle: Left ventricular ejection fraction, by estimation, is 60 to 65%. The left ventricle has normal function. The left ventricle has no regional wall motion abnormalities. The left ventricular internal cavity size was normal in size. There is  no left ventricular hypertrophy. Left ventricular diastolic parameters are consistent with Grade I diastolic dysfunction (impaired relaxation). Right Ventricle: The right ventricular size is normal. No increase in right ventricular wall thickness. Right ventricular systolic function is normal. There is moderately elevated pulmonary artery systolic pressure. The tricuspid regurgitant velocity is 3.60 m/s, and with an assumed right atrial pressure of 8 mmHg, the estimated right ventricular systolic pressure is 59.8 mmHg. Left Atrium: Left atrial size was mild to moderately dilated. Right Atrium: Right atrial size was mild to moderately dilated. Pericardium: There is no evidence of pericardial effusion. Mitral Valve: The mitral valve is normal in structure. Moderate to severe mitral valve regurgitation. No evidence of mitral valve stenosis. MV peak gradient, 4.8 mmHg. The mean mitral valve gradient is 1.0 mmHg.  Tricuspid Valve: The tricuspid valve is normal in structure. Tricuspid valve regurgitation is moderate to severe. No evidence of tricuspid stenosis. Aortic Valve: The aortic valve is normal in structure. Aortic valve regurgitation is mild. No aortic stenosis is present. Aortic valve mean gradient measures 4.0 mmHg. Aortic valve peak gradient measures 10.1 mmHg. Aortic valve area, by VTI measures 2.75  cm. Pulmonic Valve: The pulmonic valve was normal in structure. Pulmonic valve  regurgitation is not visualized. No evidence of pulmonic stenosis. Aorta: The aortic root is normal in size and structure. Venous: The inferior vena cava is normal in size with greater than 50% respiratory variability, suggesting right atrial pressure of 3 mmHg. IAS/Shunts: No atrial level shunt detected by color flow Doppler.  LEFT VENTRICLE PLAX 2D LVIDd:         4.50 cm   Diastology LVIDs:         2.80 cm   LV e' medial:    6.42 cm/s LV PW:         1.00 cm   LV E/e' medial:  17.0 LV IVS:        1.30 cm   LV e' lateral:   8.70 cm/s LVOT diam:     2.10 cm   LV E/e' lateral: 12.5 LV SV:         86 LV SV Index:   52 LVOT Area:     3.46 cm  RIGHT VENTRICLE RV Basal diam:  3.65 cm RV Mid diam:    2.70 cm RV S prime:     8.92 cm/s TAPSE (M-mode): 2.0 cm LEFT ATRIUM              Index        RIGHT ATRIUM           Index LA diam:        4.10 cm  2.50 cm/m   RA Area:     19.20 cm LA Vol (A2C):   115.0 ml 70.07 ml/m  RA Volume:   50.40 ml  30.71 ml/m LA Vol (A4C):   61.7 ml  37.60 ml/m LA Biplane Vol: 85.5 ml  52.10 ml/m  AORTIC VALVE                    PULMONIC VALVE AV Area (Vmax):    2.03 cm     PV Vmax:       0.90 m/s AV Area (Vmean):   2.40 cm     PV Peak grad:  3.2 mmHg AV Area (VTI):     2.75 cm AV Vmax:           159.00 cm/s AV Vmean:          91.500 cm/s AV VTI:            0.311 m AV Peak Grad:      10.1 mmHg AV Mean Grad:      4.0 mmHg LVOT Vmax:         93.40 cm/s LVOT Vmean:        63.300 cm/s LVOT VTI:          0.247 m  LVOT/AV VTI ratio: 0.79  AORTA Ao Root diam: 3.30 cm Ao Asc diam:  4.10 cm MITRAL VALVE                TRICUSPID VALVE MV Area (PHT): 4.65 cm     TR Peak grad:   51.8 mmHg MV Area VTI:   2.77 cm     TR Vmax:        360.00 cm/s MV Peak grad:  4.8 mmHg MV Mean grad:  1.0 mmHg     SHUNTS MV Vmax:       1.10 m/s     Systemic VTI:  0.25 m MV Vmean:      54.4 cm/s    Systemic Diam: 2.10 cm MV Decel Time: 163 msec MV E  velocity: 109.00 cm/s MV A velocity: 77.00 cm/s MV E/A ratio:  1.42 Marcina Millard MD Electronically signed by Marcina Millard MD Signature Date/Time: 08/27/2022/1:16:52 PM    Final    US Venous Img Lower Bilateral (DVT)  Result Date: 08/26/2022 CLINICAL DATA:  220372 Positive D dimer 220372. EXAM: BILATERAL LOWER EXTREMITY VENOUS DOPPLER ULTRASOUND TECHNIQUE: Gray-scale sonography with compression, as well as color and duplex ultrasound, were performed to evaluate the deep venous system(s) from the level of the common femoral vein through the popliteal and proximal calf veins. COMPARISON:  None Available. FINDINGS: VENOUS Normal compressibility of the common femoral, superficial femoral, and popliteal veins, as well as the visualized calf veins. Visualized portions of profunda femoral vein and great saphenous vein unremarkable. No filling defects to suggest DVT on grayscale or color Doppler imaging. Doppler waveforms show normal direction of venous flow, normal respiratory plasticity and response to augmentation. OTHER Cyst in the left popliteal fossa measuring up to 5 cm, likely Baker's cyst. Limitations: none IMPRESSION: 1. No lower extremity DVT. 2. 5 cm left Baker's cyst. Electronically Signed   By: Orvan Falconer M.D.   On: 08/26/2022 15:45   NM Pulmonary Perfusion  Result Date: 08/26/2022 CLINICAL DATA:  Pulmonary embolus suspected EXAM: NUCLEAR MEDICINE PERFUSION LUNG SCAN TECHNIQUE: Perfusion images were obtained in multiple projections after intravenous injection of  radiopharmaceutical. Ventilation scans intentionally deferred if perfusion scan and chest x-ray adequate for interpretation during COVID 19 epidemic. RADIOPHARMACEUTICALS:  4.2 mCi Tc-3m MAA IV COMPARISON:  None Available. FINDINGS: Bilateral mild-to-moderate perfusion defects. No large perfusion defects. IMPRESSION: Nondiagnostic (intermediate probability). Correlate with lower extremity ultrasound and consider chest CTA for further evaluation. Electronically Signed   By: Allegra Lai M.D.   On: 08/26/2022 15:10   DG Chest Port 1 View  Result Date: 08/26/2022 CLINICAL DATA:  89 year old female with history of hematemesis and generalized weakness. EXAM: PORTABLE CHEST 1 VIEW COMPARISON:  Chest x-ray 06/07/2022. FINDINGS: Lung volumes are low. Bibasilar opacities favored to reflect areas of subsegmental atelectasis. Diffuse interstitial prominence and widespread peribronchial cuffing. No definite pleural effusions. No pneumothorax. No evidence of pulmonary edema. Heart size is normal. Upper mediastinal contours are within normal limits allowing for patient positioning. Atherosclerotic calcifications in the thoracic aorta. IMPRESSION: 1. The appearance of the chest is concerning for probable acute bronchitis, as above. Some degree of underlying interstitial lung disease is also suspected. 2. Aortic atherosclerosis. Electronically Signed   By: Trudie Reed M.D.   On: 08/26/2022 05:51    Assessment/Plan Diarrhea, unspecified type Patient presents with ongoing diarrhea. Present during recent admission, however, given antibiotics no further evaluation performed. Patient states it has slightly improved this afternoon. Reviewed medications and patient should remove magnesium, colace, and olanzapine from medications. Also discussed possible side effect from boost. Plan to follow up with PCP on Tuesday, if persistent should consider work up with H. Pylori, C. Diff, and ova/parasites. Most likely if infectious  to be c.diff given recent antibiotics, however, patient states it is loose not diarrhea at this time. Defer BMP at this time. Could also consider stool bulking agent such as citrucel 3x per week to help with symptoms. Apprehensive about starting this medication given patient's poor fluid in take.   Labs/tests ordered:  * No order type specified * Next appt:  Visit date not found

## 2022-09-23 NOTE — Patient Instructions (Addendum)
Please cut back on Boost supplement until you see Dr. Sonnie Alamo on 5/21.   Please make sure you do not have the following medications in your pill box:  - Magnesium 400 mg tablets  - Olanzapine (Zyprexa) 2.5 mg tablets - Colace  Continue to eat plenty of protein - chicken, fish, peanut butter, nuts, spinach.  Continue to eat plenty of fiber - kale, spinach, oats.

## 2022-10-22 NOTE — Progress Notes (Signed)
Given vomiting- covid test was ordered to evaluate for viral cause.

## 2022-10-26 ENCOUNTER — Encounter: Payer: Self-pay | Admitting: Podiatry

## 2022-10-26 ENCOUNTER — Ambulatory Visit: Payer: Medicare PPO | Admitting: Podiatry

## 2022-10-26 DIAGNOSIS — D689 Coagulation defect, unspecified: Secondary | ICD-10-CM | POA: Diagnosis not present

## 2022-10-26 DIAGNOSIS — D2372 Other benign neoplasm of skin of left lower limb, including hip: Secondary | ICD-10-CM

## 2022-10-26 DIAGNOSIS — B351 Tinea unguium: Secondary | ICD-10-CM

## 2022-10-26 DIAGNOSIS — D2371 Other benign neoplasm of skin of right lower limb, including hip: Secondary | ICD-10-CM | POA: Diagnosis not present

## 2022-10-26 DIAGNOSIS — M79674 Pain in right toe(s): Secondary | ICD-10-CM

## 2022-10-26 DIAGNOSIS — M79675 Pain in left toe(s): Secondary | ICD-10-CM | POA: Diagnosis not present

## 2022-10-26 NOTE — Progress Notes (Signed)
They presents today chief complaint of painful elongated toenails and calluses to the plantar aspect of the right foot.  Objective: Vital signs are stable alert and oriented x 3.  There is no erythema edema cellulitis drainage or odor benign skin lesions to the plantar aspect of the forefoot beneath the first the third and the fifth metatarsals.  No open lesions or wounds.  Toenails are long thick yellow dystrophic onychomycotic particular on the left foot as opposed to the right foot.  Assessment: Pain in limb secondary to onychomycosis bilateral benign skin lesions to the plantar aspect of the right foot.  Plan: Debrided benign skin lesions debrided nails 1 through 5 bilaterally.

## 2022-11-02 ENCOUNTER — Emergency Department
Admission: EM | Admit: 2022-11-02 | Discharge: 2022-11-02 | Disposition: A | Discharge status: Home / Self Care | Payer: Medicare PPO | Attending: Emergency Medicine | Admitting: Emergency Medicine

## 2022-11-02 DIAGNOSIS — R251 Tremor, unspecified: Secondary | ICD-10-CM | POA: Diagnosis present

## 2022-11-02 LAB — COMPREHENSIVE METABOLIC PANEL
ALT: 15 U/L (ref 0–44)
AST: 25 U/L (ref 15–41)
Albumin: 3.9 g/dL (ref 3.5–5.0)
Alkaline Phosphatase: 64 U/L (ref 38–126)
Anion gap: 11 (ref 5–15)
BUN: 34 mg/dL — ABNORMAL HIGH (ref 8–23)
CO2: 26 mmol/L (ref 22–32)
Calcium: 9.5 mg/dL (ref 8.9–10.3)
Chloride: 98 mmol/L (ref 98–111)
Creatinine, Ser: 1.69 mg/dL — ABNORMAL HIGH (ref 0.44–1.00)
GFR, Estimated: 28 mL/min — ABNORMAL LOW (ref >60)
Glucose, Bld: 106 mg/dL — ABNORMAL HIGH (ref 70–99)
Potassium: 3.2 mmol/L — ABNORMAL LOW (ref 3.5–5.1)
Sodium: 135 mmol/L (ref 135–145)
Total Bilirubin: 0.5 mg/dL (ref 0.3–1.2)
Total Protein: 7.8 g/dL (ref 6.5–8.1)

## 2022-11-02 LAB — CBC WITH DIFFERENTIAL/PLATELET
Abs Immature Granulocytes: 0.03 10*3/uL (ref 0.00–0.07)
Basophils Absolute: 0.1 10*3/uL (ref 0.0–0.1)
Basophils Relative: 1 %
Eosinophils Absolute: 0.2 10*3/uL (ref 0.0–0.5)
Eosinophils Relative: 3 %
HCT: 33.5 % — ABNORMAL LOW (ref 36.0–46.0)
Hemoglobin: 10.8 g/dL — ABNORMAL LOW (ref 12.0–15.0)
Immature Granulocytes: 0 %
Lymphocytes Relative: 36 %
Lymphs Abs: 2.8 10*3/uL (ref 0.7–4.0)
MCH: 29.9 pg (ref 26.0–34.0)
MCHC: 32.2 g/dL (ref 30.0–36.0)
MCV: 92.8 fL (ref 80.0–100.0)
Monocytes Absolute: 0.9 10*3/uL (ref 0.1–1.0)
Monocytes Relative: 12 %
Neutro Abs: 3.8 10*3/uL (ref 1.7–7.7)
Neutrophils Relative %: 48 %
Platelets: 217 10*3/uL (ref 150–400)
RBC: 3.61 MIL/uL — ABNORMAL LOW (ref 3.87–5.11)
RDW: 14.2 % (ref 11.5–15.5)
WBC: 7.8 10*3/uL (ref 4.0–10.5)
nRBC: 0 % (ref 0.0–0.2)

## 2022-11-02 LAB — TROPONIN I (HIGH SENSITIVITY): Troponin I (High Sensitivity): 19 ng/L — ABNORMAL HIGH (ref <18)

## 2022-11-02 NOTE — ED Notes (Signed)
Reviewed pt's triage note; protocols added; charge nurse notified

## 2022-11-02 NOTE — ED Provider Notes (Signed)
[facility] Provider Note   Event Date/Time   First MD Initiated Contact with Patient 11/02/22 2123     (approximate) History  Tremors  HPI Tina Curtis is a 89 y.o. female with a stated past medical history of tremors thought to be secondary to hypocalcemia who presents complaining of breakthrough tremors.  Patient states that these tremors occur at least twice per day with no exacerbating or relieving factors and lasted approximately 20 minutes on average before resolving spontaneously.  Patient states that when she was hospitalized for this previously she got an IV dose of calcium which resolved her tremors.  Patient has been taking her outpatient calcium supplementation on time and as prescribed ROS: Patient currently denies any vision changes, tinnitus, difficulty speaking, facial droop, sore throat, chest pain, shortness of breath, abdominal pain, nausea/vomiting/diarrhea, dysuria, or weakness/numbness/paresthesias in any extremity   Physical Exam  Triage Vital Signs: ED Triage Vitals  Enc Vitals Group     BP 11/02/22 1421 109/60     Pulse Rate 11/02/22 1421 80     Resp 11/02/22 1421 16     Temp 11/02/22 1421 98 F (36.7 C)     Temp Source 11/02/22 1421 Oral     SpO2 11/02/22 1421 99 %     Weight 11/02/22 1422 130 lb (59 kg)     Height 11/02/22 1422 5\' 2"  (1.575 m)     Head Circumference --      Peak Flow --      Pain Score 11/02/22 1422 0     Pain Loc --      Pain Edu? --      Excl. in GC? --    Most recent vital signs: Vitals:   11/02/22 1421 11/02/22 1904  BP: 109/60 (!) 165/75  Pulse: 80 72  Resp: 16 17  Temp: 98 F (36.7 C) 98.2 F (36.8 C)  SpO2: 99% 97%   General: Awake, oriented x4. CV:  Good peripheral perfusion.  Resp:  Normal effort.  Abd:  No distention.  Other:  Elderly well-developed, well-nourished Caucasian female sitting in stretcher in no acute distress.  No tremor appreciated ED Results / Procedures / Treatments   Labs (all labs ordered are listed, but only abnormal results are displayed) Labs Reviewed  CBC WITH DIFFERENTIAL/PLATELET - Abnormal; Notable for the following components:      Result Value   RBC 3.61 (*)    Hemoglobin 10.8 (*)    HCT 33.5 (*)    All other components within normal limits  COMPREHENSIVE METABOLIC PANEL - Abnormal; Notable for the following components:   Potassium 3.2 (*)    Glucose, Bld 106 (*)    BUN 34 (*)    Creatinine, Ser 1.69 (*)    GFR, Estimated 28 (*)    All other components within normal limits  TROPONIN I (HIGH SENSITIVITY) - Abnormal; Notable for the following components:   Troponin I (High Sensitivity) 19 (*)    All other components within normal limits  URINALYSIS, ROUTINE W REFLEX MICROSCOPIC  TROPONIN I (HIGH SENSITIVITY)   EKG ED ECG REPORT I, Merwyn Katos, the attending physician, personally viewed and interpreted this ECG. Date: 11/02/2022 EKG Time: 1944 Rate: 69 Rhythm: normal sinus rhythm QRS Axis: normal Intervals: normal ST/T Wave abnormalities: normal Narrative Interpretation: no evidence of acute ischemia PROCEDURES: Critical Care performed: No .1-3 Lead EKG Interpretation  Performed by: Merwyn Katos, MD Authorized by: Merwyn Katos, MD  Interpretation: normal     ECG rate:  71   ECG rate assessment: normal     Rhythm: sinus rhythm     Ectopy: none     Conduction: normal    MEDICATIONS ORDERED IN ED: Medications - No data to display IMPRESSION / MDM / ASSESSMENT AND PLAN / ED COURSE  I reviewed the triage vital signs and the nursing notes.                             The patient is on the cardiac monitor to evaluate for evidence of arrhythmia and/or significant heart rate changes. Patient's presentation is most consistent with acute presentation with potential threat to life or bodily function. Presents with intermittent tremors  Patients symptoms and work-up not consistent with a stroke and therefore they  will be discharged from the ED.  Patient has currently been stabilized in the emergency department.  Patient received an head CT previously for similar symptoms that was negative for stroke.  Patients symptoms not typical for other emergent causes such as dissection, infection, DKA, trauma. Patient will be discharged with strict return precautions and follow up with primary MD/neurology within 24 hours for further evaluation.   FINAL CLINICAL IMPRESSION(S) / ED DIAGNOSES   Final diagnoses:  Occasional tremors   Rx / DC Orders   ED Discharge Orders     None      Note:  This document was prepared using Dragon voice recognition software and may include unintentional dictation errors.   Merwyn Katos, MD 11/02/22 2156

## 2022-11-02 NOTE — ED Triage Notes (Signed)
Pt reports having tremors and recently being told she had a calcium deficiency. Pt get routine infusions for this calcium deficiency. Pt denies any pain

## 2022-11-17 ENCOUNTER — Inpatient Hospital Stay: Payer: Medicare PPO | Attending: Internal Medicine

## 2022-11-17 ENCOUNTER — Inpatient Hospital Stay: Payer: Medicare PPO | Admitting: Nurse Practitioner

## 2022-11-17 ENCOUNTER — Encounter: Payer: Self-pay | Admitting: Nurse Practitioner

## 2022-11-17 VITALS — BP 147/68 | HR 80 | Temp 97.3°F | Wt 132.0 lb

## 2022-11-17 DIAGNOSIS — E559 Vitamin D deficiency, unspecified: Secondary | ICD-10-CM | POA: Insufficient documentation

## 2022-11-17 DIAGNOSIS — C50811 Malignant neoplasm of overlapping sites of right female breast: Secondary | ICD-10-CM | POA: Insufficient documentation

## 2022-11-17 DIAGNOSIS — R519 Headache, unspecified: Secondary | ICD-10-CM | POA: Diagnosis not present

## 2022-11-17 DIAGNOSIS — Z5181 Encounter for therapeutic drug level monitoring: Secondary | ICD-10-CM

## 2022-11-17 DIAGNOSIS — Z85828 Personal history of other malignant neoplasm of skin: Secondary | ICD-10-CM | POA: Diagnosis not present

## 2022-11-17 DIAGNOSIS — R5383 Other fatigue: Secondary | ICD-10-CM | POA: Insufficient documentation

## 2022-11-17 DIAGNOSIS — Z170 Estrogen receptor positive status [ER+]: Secondary | ICD-10-CM | POA: Diagnosis not present

## 2022-11-17 DIAGNOSIS — M810 Age-related osteoporosis without current pathological fracture: Secondary | ICD-10-CM | POA: Diagnosis not present

## 2022-11-17 DIAGNOSIS — Z803 Family history of malignant neoplasm of breast: Secondary | ICD-10-CM | POA: Insufficient documentation

## 2022-11-17 DIAGNOSIS — C773 Secondary and unspecified malignant neoplasm of axilla and upper limb lymph nodes: Secondary | ICD-10-CM | POA: Diagnosis not present

## 2022-11-17 DIAGNOSIS — Z79811 Long term (current) use of aromatase inhibitors: Secondary | ICD-10-CM | POA: Insufficient documentation

## 2022-11-17 DIAGNOSIS — Z08 Encounter for follow-up examination after completed treatment for malignant neoplasm: Secondary | ICD-10-CM

## 2022-11-17 DIAGNOSIS — C50912 Malignant neoplasm of unspecified site of left female breast: Secondary | ICD-10-CM | POA: Diagnosis not present

## 2022-11-17 DIAGNOSIS — Z853 Personal history of malignant neoplasm of breast: Secondary | ICD-10-CM

## 2022-11-17 DIAGNOSIS — M170 Bilateral primary osteoarthritis of knee: Secondary | ICD-10-CM | POA: Insufficient documentation

## 2022-11-17 DIAGNOSIS — Z923 Personal history of irradiation: Secondary | ICD-10-CM | POA: Insufficient documentation

## 2022-11-17 DIAGNOSIS — C50211 Malignant neoplasm of upper-inner quadrant of right female breast: Secondary | ICD-10-CM | POA: Diagnosis not present

## 2022-11-17 DIAGNOSIS — Z9013 Acquired absence of bilateral breasts and nipples: Secondary | ICD-10-CM | POA: Insufficient documentation

## 2022-11-17 DIAGNOSIS — K862 Cyst of pancreas: Secondary | ICD-10-CM | POA: Insufficient documentation

## 2022-11-17 DIAGNOSIS — Z90710 Acquired absence of both cervix and uterus: Secondary | ICD-10-CM | POA: Insufficient documentation

## 2022-11-17 DIAGNOSIS — N184 Chronic kidney disease, stage 4 (severe): Secondary | ICD-10-CM | POA: Diagnosis not present

## 2022-11-17 LAB — CBC WITH DIFFERENTIAL (CANCER CENTER ONLY)
Abs Immature Granulocytes: 0.02 10*3/uL (ref 0.00–0.07)
Basophils Absolute: 0.1 10*3/uL (ref 0.0–0.1)
Basophils Relative: 1 %
Eosinophils Absolute: 0.2 10*3/uL (ref 0.0–0.5)
Eosinophils Relative: 3 %
HCT: 34.5 % — ABNORMAL LOW (ref 36.0–46.0)
Hemoglobin: 11.2 g/dL — ABNORMAL LOW (ref 12.0–15.0)
Immature Granulocytes: 0 %
Lymphocytes Relative: 32 %
Lymphs Abs: 2.3 10*3/uL (ref 0.7–4.0)
MCH: 29.8 pg (ref 26.0–34.0)
MCHC: 32.5 g/dL (ref 30.0–36.0)
MCV: 91.8 fL (ref 80.0–100.0)
Monocytes Absolute: 0.7 10*3/uL (ref 0.1–1.0)
Monocytes Relative: 9 %
Neutro Abs: 4 10*3/uL (ref 1.7–7.7)
Neutrophils Relative %: 55 %
Platelet Count: 191 10*3/uL (ref 150–400)
RBC: 3.76 MIL/uL — ABNORMAL LOW (ref 3.87–5.11)
RDW: 14.2 % (ref 11.5–15.5)
WBC Count: 7.3 10*3/uL (ref 4.0–10.5)
nRBC: 0 % (ref 0.0–0.2)

## 2022-11-17 LAB — CMP (CANCER CENTER ONLY)
ALT: 17 U/L (ref 0–44)
AST: 27 U/L (ref 15–41)
Albumin: 4.2 g/dL (ref 3.5–5.0)
Alkaline Phosphatase: 81 U/L (ref 38–126)
Anion gap: 10 (ref 5–15)
BUN: 38 mg/dL — ABNORMAL HIGH (ref 8–23)
CO2: 26 mmol/L (ref 22–32)
Calcium: 9.6 mg/dL (ref 8.9–10.3)
Chloride: 103 mmol/L (ref 98–111)
Creatinine: 1.44 mg/dL — ABNORMAL HIGH (ref 0.44–1.00)
GFR, Estimated: 34 mL/min — ABNORMAL LOW (ref >60)
Glucose, Bld: 158 mg/dL — ABNORMAL HIGH (ref 70–99)
Potassium: 3.7 mmol/L (ref 3.5–5.1)
Sodium: 139 mmol/L (ref 135–145)
Total Bilirubin: 0.6 mg/dL (ref 0.3–1.2)
Total Protein: 7.8 g/dL (ref 6.5–8.1)

## 2022-11-17 NOTE — Progress Notes (Signed)
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 Burnett] stage III T2N3 [19/24 LN]; ER/PR positive HER-2 negative.   # Left breast-invasive mammary carcinoma-status post mastectomy [id number]; stage I-ER/PR positive HER-2 negative  #November 23, 2019-start adjuvant radiation bilateral [finished August 31]; no chemo;   #September 22nd 2021- anastrozole ----------------------------------------------------------------------------------------   A. RIGHT BREAST, 3:00; ULTRASOUND-GUIDED BIOPSY:  - INVASIVE MAMMARY CARCINOMA WITH MICROPAPILLARY FEATURES; Size of invasive carcinoma: 10mm in this sample  Histologic grade of invasive carcinoma: Grade 2 ; ER/PR > 905: her 2- NEG  A. RIGHT BREAST, 3:00; ULTRASOUND-GUIDED BIOPSY: - INVASIVE MAMMARY CARCINOMA WITH MICROPAPILLARY FEATURES. 10mm in this sample. Grade 2. Lymphovascular invasion: Present.   B. LYMPH NODE, RIGHT AXILLARY; ULTRASOUND-GUIDED NEEDLE CORE BIOPSY: - METASTATIC MAMMARY CARCINOMA, 6 MM IN THIS SAMPLE.   C. RIGHT BREAST, LATERAL CALCIFICATIONS; STEREOTACTIC BIOPSY: - DUCTAL CARCINOMA IN SITU, INTERMEDIATE GRADE, WITH CALCIFICATIONS. -------------------------------------------------------------   # CKD- Stage III-IV [Dr.Kolluru]  # SURVIVORSHIP:   # GENETICS:   DIAGNOSIS:  Right breast cancer stage III;  left breast cancer-stage I GOALS: Cure  CURRENT/MOST RECENT THERAPY : Anastrozole     Carcinoma of upper-inner quadrant of right breast in female, estrogen receptor positive (HCC)  09/27/2019 Initial Diagnosis   Carcinoma of upper-inner quadrant of right breast in female, estrogen receptor positive (HCC)     HISTORY OF PRESENTING ILLNESS: Ambulating walking with a rolling walker.  Alone.  Tina Curtis 89 y.o.  female synchronous bilateral breast cancer [right stage III ER/PR positive;Her2 NEG; left stage I ER/PR positive; Her2- NEG] s/p adjuvant radiation; currently on Anastrazole is here for follow-up. She was seen in ER on 11/02/22 for tremors and concern for hypocalcemia. She denies chest wall pain, skin changes. No new bone pain. Continues to take anastrozole.   Review of Systems  Constitutional:  Positive for malaise/fatigue. Negative for chills, diaphoresis, fever and weight loss.  HENT:  Negative for nosebleeds and sore throat.   Eyes:  Negative for double vision.  Respiratory:  Negative for cough, hemoptysis, sputum production, shortness of breath and wheezing.   Cardiovascular:  Negative for chest pain, palpitations, orthopnea and leg swelling.  Gastrointestinal:  Negative for abdominal pain, blood in stool, constipation, diarrhea, heartburn, melena, nausea and vomiting.  Genitourinary:  Negative for dysuria, frequency and urgency.  Musculoskeletal:  Positive for back pain and joint pain.  Neurological:  Negative for dizziness, tingling, focal weakness, weakness and headaches.  Endo/Heme/Allergies:  Does not bruise/bleed easily.  Psychiatric/Behavioral:  Negative for depression. The patient is not nervous/anxious and does not have insomnia.      MEDICAL HISTORY:  Past Medical History:  Diagnosis Date   Arthritis    Gout   Chronic kidney disease    Diarrhea    Hypercholesteremia    Hypertension    Neuropathy    feet and lower legs   Seasonal allergies    Skin cancer    face    SURGICAL HISTORY: Past Surgical History:  Procedure Laterality Date    ABDOMINAL HYSTERECTOMY  2000   APPENDECTOMY  BREAST BIOPSY Right 09/19/2019   affirm bx of calcs UOQ, x marker, path pending   BREAST BIOPSY Right 09/19/2019   [place] bx of mass,heart marker, path pending   BREAST BIOPSY Right 09/19/2019   [place] bx of LN, coil marker, path pending   CATARACT EXTRACTION Left    CATARACT EXTRACTION W/PHACO Right 10/24/2014   Procedure: CATARACT EXTRACTION PHACO AND INTRAOCULAR LENS PLACEMENT (IOC);  Surgeon: Lockie Mola, MD;  Location: [facility];  Service: Ophthalmology;  Laterality: Right;   ESOPHAGOGASTRODUODENOSCOPY N/A 03/07/2018   Procedure: ESOPHAGOGASTRODUODENOSCOPY (EGD);  Surgeon: Toney Reil, MD;  Location: [facility] END[facility]COPY;  Service: Gastroenterology;  Laterality: N/A;   MASTECTOMY MODIFIED RADICAL Right 10/13/2019   Procedure: MASTECTOMY MODIFIED RADICAL;  Surgeon: Earline Mayotte, MD;  Location: [facility] ORS;  Service: General;  Laterality: Right;   RENAL ANGIOGRAPHY Right 04/11/2018   Procedure: RENAL ANGIOGRAPHY;  Surgeon: Annice Needy, MD;  Location: [facility] INVASIVE CV LAB;  Service: Cardiovascular;  Laterality: Right;   SIMPLE MASTECTOMY WITH AXILLARY SENTINEL NODE BIOPSY Left 10/13/2019   Procedure: SIMPLE MASTECTOMY TRUE CUT BIOPSY, SENTINEL NODE BIOPSY;  Surgeon: Earline Mayotte, MD;  Location: [facility] ORS;  Service: General;  Laterality: Left;    SOCIAL HISTORY: Social History   Socioeconomic History   Marital status: Widowed    Spouse name: Not on file   Number of children: Not on file   Years of education: Not on file   Highest education level: Not on file  Occupational History   Not on file  Tobacco Use   Smoking status: Never   Smokeless tobacco: Never  Vaping Use   Vaping Use: Never used  Substance and Sexual Activity   Alcohol use: No   Drug use: Never   Sexual activity: Not on file  Other Topics Concern   Not on file  Social History Narrative   2 daughters; lives in [address]; Runner, broadcasting/film/video retd.  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    Social Determinants of Health   Financial Resource Strain: Not on file  Food Insecurity: No Food Insecurity (08/27/2022)   Hunger Vital Sign    Worried About Running Out of Food in the Last Year: Never true    Ran Out of Food in the Last Year: Never true  Transportation Needs: No Transportation Needs (08/27/2022)   PRAPARE - Administrator, Civil Service (Medical): No    Lack of Transportation (Non-Medical): No  Physical Activity: Not on file  Stress: Not on file  Social Connections: Not on file  Intimate Partner Violence: Not At Risk (08/27/2022)   Humiliation, Afraid, Rape, and Kick questionnaire    Fear of Current or Ex-Partner: No    Emotionally Abused: No    Physically Abused: No    Sexually Abused: No    FAMILY HISTORY: Family History  Problem Relation Age of Onset   Hypertension Mother    Hypertension Father    Stroke Father    Breast cancer Daughter 77    ALLERGIES:  is allergic to penicillins, drug ingredient [zinc], and empagliflozin.  MEDICATIONS:  Current Outpatient Medications  Medication Sig Dispense Refill   albuterol (VENTOLIN HFA) 108 (90 Base) MCG/ACT inhaler Inhale 2 puffs into the lungs every 6 (six) hours as needed for wheezing or shortness of breath. 8 g 0   anastrozole (ARIMIDEX) 1 MG tablet TAKE 1 TABLET(1 MG) BY MOUTH DAILY. DO NOT. START IF RASH IS NOT BETTER 90 tablet 1   aspirin 81  MG EC tablet Take 81 mg by mouth daily.     calcitRIOL (ROCALTROL) 0.25 MCG capsule Take 0.25 mcg by mouth every other day.     calcium carbonate (OS-CAL - DOSED IN MG OF ELEMENTAL CALCIUM) 1250 (500 Ca) MG tablet Take 2 tablets by mouth 4 (four) times daily.     citalopram (CELEXA) 10 MG tablet Take 10 mg by mouth daily.      clopidogrel (PLAVIX) 75 MG tablet TAKE 1 TABLET BY MOUTH EVERY DAY 30 tablet 1   cyanocobalamin (VITAMIN B12) 1000 MCG tablet Take 1,000 mcg by mouth daily.     docusate sodium (COLACE) 100  MG capsule Take 100 mg by mouth daily as needed for mild constipation.     DULoxetine (CYMBALTA) 60 MG capsule Take 60 mg by mouth daily.      furosemide (LASIX) 40 MG tablet Take 1 tablet (40 mg total) by mouth daily. 30 tablet 0   gabapentin (NEURONTIN) 300 MG capsule Take 300 mg by mouth at bedtime.     hydrALAZINE (APRESOLINE) 25 MG tablet Take 25 mg by mouth 3 (three) times daily.     isosorbide mononitrate (IMDUR) 30 MG 24 hr tablet Take 1 tablet (30 mg total) by mouth daily.     lidocaine (LIDODERM) 5 % Place 1 patch onto the skin daily. Remove & Discard patch within 12 hours or as directed by MD     loratadine (CLARITIN) 10 MG tablet Take 10 mg by mouth daily as needed for allergies. AM     metoprolol succinate (TOPROL-XL) 25 MG 24 hr tablet Take 37.5 mg by mouth at bedtime.      Multiple Vitamins-Minerals (ICAPS AREDS 2 PO) Take 2 capsules by mouth daily.     multivitamin (PROSIGHT) TABS tablet Take 1 tablet by mouth daily. 30 tablet 0   pantoprazole (PROTONIX) 40 MG tablet Take 1 tablet (40 mg total) by mouth daily.     simvastatin (ZOCOR) 20 MG tablet Take 20 mg by mouth every evening. PM     traMADol (ULTRAM) 50 MG tablet Take 1 tablet (50 mg total) by mouth every 12 (twelve) hours as needed for moderate pain. 10 tablet 0   triamcinolone cream (KENALOG) 0.1 % Apply 1 Application topically 2 (two) times daily.     Vitamin D, Ergocalciferol, (DRISDOL) 1.25 MG (50000 UNIT) CAPS capsule Take 50,000 Units by mouth every 7 (seven) days.     Zinc Oxide (TRIPLE PASTE) 12.8 % ointment Apply 1 Application topically as directed. Apply to buttocks every shift for skin protection     No current facility-administered medications for this visit.   PHYSICAL EXAMINATION: ECOG PERFORMANCE STATUS: 0 - Asymptomatic  Vitals:   11/17/22 1326  BP: (!) 147/68  Pulse: 80  Temp: (!) 97.3 F (36.3 C)  SpO2: 100%   Filed Weights   11/17/22 1326  Weight: 132 lb (59.9 kg)   Physical  Exam Constitutional:      Appearance: She is not ill-appearing.  Eyes:     General: No scleral icterus.    Conjunctiva/sclera: Conjunctivae normal.  Cardiovascular:     Rate and Rhythm: Normal rate and regular rhythm.  Abdominal:     General: There is no distension.     Palpations: Abdomen is soft.     Tenderness: There is no abdominal tenderness. There is no guarding.  Musculoskeletal:        General: No deformity.     Right lower leg: No edema.  Left lower leg: No edema.  Lymphadenopathy:     Cervical: No cervical adenopathy.  Skin:    General: Skin is warm and dry.  Neurological:     Mental Status: She is alert and oriented to person, place, and time. Mental status is at baseline.  Psychiatric:        Mood and Affect: Mood normal.        Behavior: Behavior normal.     Results for LAQUITA, HARLAN (MRN [id number]) as of 01/15/2021 15:37  Ref. Range 08/27/2020 09:55 09/02/2020 12:32 10/22/2020 14:01 12/04/2020 14:35 01/15/2021 14:50  CA 15-3 Latest Ref Range: 0.0 - 25.0 U/mL   32.0 (H) 27.9 (H)   CA 27.29 Latest Ref Range: 0.0 - 38.6 U/mL   38.0 27.8   CEA Latest Ref Range: 0.0 - 4.7 ng/mL   4.1 3.6    LABORATORY DATA:  I have reviewed the data as listed Lab Results  Component Value Date   WBC 7.3 11/17/2022   HGB 11.2 (L) 11/17/2022   HCT 34.5 (L) 11/17/2022   MCV 91.8 11/17/2022   PLT 191 11/17/2022   Recent Labs    06/05/22 2038 06/06/22 0441 09/08/22 0913 09/09/22 0000 09/11/22 0000 11/02/22 1942 11/17/22 1304  NA  --    < > 131*   < > 135* 135 139  K  --    < > 3.7   < > 4.5 3.2* 3.7  CL  --    < > 101   < > 99 98 103  CO2  --    < > 23   < > 24* 26 26  GLUCOSE  --    < > 154*  --   --  106* 158*  BUN  --    < > 19   < > 23* 34* 38*  CREATININE  --    < > 1.41*   < > 1.2* 1.69* 1.44*  CALCIUM  --    < > 7.6*   < > 8.8 9.5 9.6  GFRNONAA  --    < > 35*  --   --  28* 34*  PROT 7.4   < > 6.8  --   --  7.8 7.8  ALBUMIN 4.0   < > 2.7*   < > 3.1* 3.9 4.2  AST 27    < > 62*   < > 56* 25 27  ALT 19   < > 34   < > 27 15 17   ALKPHOS 95   < > 111  --  130* 64 81  BILITOT 0.7   < > 0.6  --   --  0.5 0.6  BILIDIR <0.1  --  <0.1  --   --   --   --   IBILI NOT CALCULATED  --  NOT CALCULATED  --   --   --   --    < > = values in this interval not displayed.    RADIOGRAPHIC STUDIES: I have personally reviewed the radiological images as listed and agreed with the findings in the report. No results found.  ASSESSMENT & PLAN:    # Synchronous bilateral breast cancer- status post bilateral mastectomies-  - Right Breast-stage III ER/PR positive HER-2 NEG - Left breast cancer stage II ER/PR Pos; Her 2 NEG.  - PET scan- April 20th, 2023- Interval bilateral mastectomy and right axillary node dissection. No evidence of chest wall recurrence or thoracic metastatic disease.  Questionable small  hypermetabolic lesion centrally in the right hepatic lobe; MRI LIVER  MAY 25th, 2023- NEGATIVE for any cancer; but cysts [see below].     # CONTINUE  anastrozole. No improvement in fatigue and headaches when holding so anastrozole was restarted. Tolerating well. Clinically no evidence of recurrent disease.     # MAY 2023[facility] MRI]-  Multi cystic lesions of the pancreas with enlargement measuring 2.6 cm greatest axial dimension; with relative stability since 2021. without high-risk features aside from enlargement since 2019. Recommend in may in 2025. Stable.    # chronic headaches- ? [seen Dr.Shah > 4 years];  MRI- Brain May  2023-  No acute intracranial pathology. S/p evaluation with Dr.Shah- on baclofen. Stable.    # JAN 2024- [hospital] acute hypoxic respiratory failure. Jan 2024- CT chest showed chronic findings including radiation fibrosis, emphysema, pulmonary hypertension- patient currently weaned off oxygen.  Monitor for now.   # Elevated BG- PBF- 158 Defer to PCP.    # Mild-moderate arthritic pain-osteoarthritis [bil knee L >R] G-1-2; continue tylenol prn.  Stable.     # vit D def [on ergocalciferol]--Stable.    # Fatigue - ? Etiology; s/p Evaluation with Marisue Humble- s/p CARE program- Stable.    # CKD- Stage- IV- GFR 29 [Dr.Kolluru]; -Stable.    # NOV 2021- Osteoporosis T-score of -3.7. Bone density 08/20/22 T score -3.1. On ca+vit D. Prolia q 6 months [Feb 2023].    Prolia q 32m- due October    DISPOSITION:  3 mo- labs (cbc, cmp, ca27.29, cea, ca15-3, Vit D), Dr Donneta Romberg, +/- Prolia- la  No problem-specific Assessment & Plan notes found for this encounter.  Alinda Dooms, NP 11/17/2022

## 2022-11-18 LAB — CEA: CEA: 3.2 ng/mL (ref 0.0–4.7)

## 2022-11-18 LAB — CANCER ANTIGEN 15-3: CA 15-3: 32 U/mL — ABNORMAL HIGH (ref 0.0–25.0)

## 2022-11-18 LAB — CANCER ANTIGEN 27.29: CA 27.29: 33.2 U/mL (ref 0.0–38.6)

## 2022-11-18 LAB — VITAMIN D 25 HYDROXY (VIT D DEFICIENCY, FRACTURES): Vit D, 25-Hydroxy: 40.81 ng/mL (ref 30–100)

## 2022-11-27 ENCOUNTER — Ambulatory Visit (INDEPENDENT_AMBULATORY_CARE_PROVIDER_SITE_OTHER): Payer: Medicare PPO | Admitting: Vascular Surgery

## 2022-11-27 ENCOUNTER — Encounter (INDEPENDENT_AMBULATORY_CARE_PROVIDER_SITE_OTHER): Payer: Medicare PPO

## 2022-12-04 ENCOUNTER — Encounter: Payer: Self-pay | Admitting: Internal Medicine

## 2022-12-11 ENCOUNTER — Ambulatory Visit (INDEPENDENT_AMBULATORY_CARE_PROVIDER_SITE_OTHER): Payer: Medicare PPO

## 2022-12-11 ENCOUNTER — Ambulatory Visit (INDEPENDENT_AMBULATORY_CARE_PROVIDER_SITE_OTHER): Payer: Medicare PPO | Admitting: Vascular Surgery

## 2022-12-11 ENCOUNTER — Encounter (INDEPENDENT_AMBULATORY_CARE_PROVIDER_SITE_OTHER): Payer: Self-pay | Admitting: Vascular Surgery

## 2022-12-11 ENCOUNTER — Encounter (INDEPENDENT_AMBULATORY_CARE_PROVIDER_SITE_OTHER): Payer: Medicare PPO

## 2022-12-11 VITALS — BP 137/73 | HR 65 | Resp 15 | Wt 134.0 lb

## 2022-12-11 DIAGNOSIS — R42 Dizziness and giddiness: Secondary | ICD-10-CM | POA: Diagnosis not present

## 2022-12-11 DIAGNOSIS — N1832 Chronic kidney disease, stage 3b: Secondary | ICD-10-CM | POA: Diagnosis not present

## 2022-12-11 DIAGNOSIS — I150 Renovascular hypertension: Secondary | ICD-10-CM

## 2022-12-11 DIAGNOSIS — I701 Atherosclerosis of renal artery: Secondary | ICD-10-CM | POA: Diagnosis not present

## 2022-12-11 DIAGNOSIS — D631 Anemia in chronic kidney disease: Secondary | ICD-10-CM

## 2022-12-11 NOTE — Progress Notes (Unsigned)
MRN : [id number]  Tina Curtis is a 89 y.o. (May 19, 1926) female who presents with chief complaint of  Chief Complaint  Patient presents with   Follow-up    Ultrasound follow up  .  History of Present Illness: Patient returns today in follow up of her renal artery stenosis.  She is doing well.  Her blood pressure remains reasonably well-controlled and her renal function remains stable.  She follows up with her nephrologist regularly.  Duplex shows no hemodynamically stenosis right renal artery stent in her solitary right kidney.  Current Outpatient Medications  Medication Sig Dispense Refill   anastrozole (ARIMIDEX) 1 MG tablet TAKE 1 TABLET(1 MG) BY MOUTH DAILY. DO NOT. START IF RASH IS NOT BETTER 90 tablet 1   aspirin 81 MG EC tablet Take 81 mg by mouth daily.     calcitRIOL (ROCALTROL) 0.25 MCG capsule Take 0.25 mcg by mouth every other day. Monday,Wednesday, Friday     calcium carbonate (OS-CAL - DOSED IN MG OF ELEMENTAL CALCIUM) 1250 (500 Ca) MG tablet Take 2 tablets by mouth 4 (four) times daily.     citalopram (CELEXA) 10 MG tablet Take 10 mg by mouth daily.      clopidogrel (PLAVIX) 75 MG tablet TAKE 1 TABLET BY MOUTH EVERY DAY 30 tablet 1   cyanocobalamin (VITAMIN B12) 1000 MCG tablet Take 1,000 mcg by mouth daily.     docusate sodium (COLACE) 100 MG capsule Take 100 mg by mouth daily as needed for mild constipation.     DULoxetine (CYMBALTA) 60 MG capsule Take 60 mg by mouth daily.      furosemide (LASIX) 40 MG tablet Take 1 tablet (40 mg total) by mouth daily. 30 tablet 0   gabapentin (NEURONTIN) 300 MG capsule Take 300 mg by mouth 2 (two) times daily.     hydrALAZINE (APRESOLINE) 25 MG tablet Take 25 mg by mouth 3 (three) times daily.     isosorbide mononitrate (IMDUR) 30 MG 24 hr tablet Take 1 tablet (30 mg total) by mouth daily.     lidocaine (LIDODERM) 5 % Place 1 patch onto the skin daily. Remove & Discard patch within 12 hours or as directed by MD     loratadine  (CLARITIN) 10 MG tablet Take 10 mg by mouth daily as needed for allergies. AM     metoprolol succinate (TOPROL-XL) 25 MG 24 hr tablet Take 37.5 mg by mouth at bedtime.      Multiple Vitamins-Minerals (ICAPS AREDS 2 PO) Take 2 capsules by mouth daily.     multivitamin (PROSIGHT) TABS tablet Take 1 tablet by mouth daily. 30 tablet 0   pantoprazole (PROTONIX) 40 MG tablet Take 1 tablet (40 mg total) by mouth daily.     simvastatin (ZOCOR) 20 MG tablet Take 20 mg by mouth every evening. PM     triamcinolone cream (KENALOG) 0.1 % Apply 1 Application topically 2 (two) times daily.     valsartan (DIOVAN) 40 MG tablet Take 40 mg by mouth daily.     Vitamin D, Ergocalciferol, (DRISDOL) 1.25 MG (50000 UNIT) CAPS capsule Take 50,000 Units by mouth every 7 (seven) days.     Zinc Oxide (TRIPLE PASTE) 12.8 % ointment Apply 1 Application topically as directed. Apply to buttocks every shift for skin protection     albuterol (VENTOLIN HFA) 108 (90 Base) MCG/ACT inhaler Inhale 2 puffs into the lungs every 6 (six) hours as needed for wheezing or shortness of breath. (Patient not taking: Reported  on 12/11/2022) 8 g 0   traMADol (ULTRAM) 50 MG tablet Take 1 tablet (50 mg total) by mouth every 12 (twelve) hours as needed for moderate pain. (Patient not taking: Reported on 12/11/2022) 10 tablet 0   No current facility-administered medications for this visit.    Past Medical History:  Diagnosis Date   Arthritis    Gout   Chronic kidney disease    Diarrhea    Hypercholesteremia    Hypertension    Neuropathy    feet and lower legs   Seasonal allergies    Skin cancer    face    Past Surgical History:  Procedure Laterality Date   ABDOMINAL HYSTERECTOMY  2000   APPENDECTOMY     BREAST BIOPSY Right 09/19/2019   affirm bx of calcs UOQ, x marker, path pending   BREAST BIOPSY Right 09/19/2019   [place] bx of mass,heart marker, path pending   BREAST BIOPSY Right 09/19/2019   [place] bx of LN, coil marker, path pending    CATARACT EXTRACTION Left    CATARACT EXTRACTION W/PHACO Right 10/24/2014   Procedure: CATARACT EXTRACTION PHACO AND INTRAOCULAR LENS PLACEMENT (IOC);  Surgeon: Lockie Mola, MD;  Location: [facility];  Service: Ophthalmology;  Laterality: Right;   ESOPHAGOGASTRODUODENOSCOPY N/A 03/07/2018   Procedure: ESOPHAGOGASTRODUODENOSCOPY (EGD);  Surgeon: Toney Reil, MD;  Location: [facility];  Service: Gastroenterology;  Laterality: N/A;   MASTECTOMY MODIFIED RADICAL Right 10/13/2019   Procedure: MASTECTOMY MODIFIED RADICAL;  Surgeon: Earline Mayotte, MD;  Location: [facility] ORS;  Service: General;  Laterality: Right;   RENAL ANGIOGRAPHY Right 04/11/2018   Procedure: RENAL ANGIOGRAPHY;  Surgeon: Annice Needy, MD;  Location: [facility];  Service: Cardiovascular;  Laterality: Right;   SIMPLE MASTECTOMY WITH AXILLARY SENTINEL NODE BIOPSY Left 10/13/2019   Procedure: SIMPLE MASTECTOMY TRUE CUT BIOPSY, SENTINEL NODE BIOPSY;  Surgeon: Earline Mayotte, MD;  Location: [facility];  Service: General;  Laterality: Left;     Social History   Tobacco Use   Smoking status: Never   Smokeless tobacco: Never  Vaping Use   Vaping status: Never Used  Substance Use Topics   Alcohol use: No   Drug use: Never      Family History  Problem Relation Age of Onset   Hypertension Mother    Hypertension Father    Stroke Father    Breast cancer Daughter 52     Allergies  Allergen Reactions   Penicillins Anaphylaxis   Drug Ingredient [Zinc]    Empagliflozin Rash    REVIEW OF SYSTEMS (Negative unless checked)   Constitutional: [] Weight loss  [] Fever  [] Chills Cardiac: [] Chest pain   [] Chest pressure   [] Palpitations   [] Shortness of breath when laying flat   [] Shortness of breath at rest   [] Shortness of breath with exertion. Vascular:  [] Pain in legs with walking   [] Pain in legs at rest   [] Pain in legs when laying flat   [] Claudication   [] Pain in feet when walking  [] Pain  in feet at rest  [] Pain in feet when laying flat   [] History of DVT   [] Phlebitis   [] Swelling in legs   [] Varicose veins   [] Non-healing ulcers Pulmonary:   [] Uses home oxygen   [] Productive cough   [] Hemoptysis   [] Wheeze  [] COPD   [] Asthma Neurologic:  [x] Dizziness  [] Blackouts   [] Seizures   [] History of stroke   [] History of TIA  [] Aphasia   [] Temporary blindness   [] Dysphagia   []   Weakness or numbness in arms   [] Weakness or numbness in legs Musculoskeletal:  [x] Arthritis   [] Joint swelling   [x] Joint pain   [] Low back pain Hematologic:  [] Easy bruising  [] Easy bleeding   [] Hypercoagulable state   [] Anemic   Gastrointestinal:  [] Blood in stool   [] Vomiting blood  [] Gastroesophageal reflux/heartburn   [] Abdominal pain Genitourinary:  [] Chronic kidney disease   [] Difficult urination  [] Frequent urination  [] Burning with urination   [] Hematuria Skin:  [] Rashes   [] Ulcers   [] Wounds Psychological:  [] History of anxiety   [x]  History of major depression.  Physical Examination  BP 137/73 (BP Location: Left Arm)   Pulse 65   Resp 15   Wt 134 lb (60.8 kg)   BMI 24.51 kg/m  Gen:  WD/WN, NAD Head: [place]/AT, No temporalis wasting. Ear/Nose/Throat: Hearing grossly intact, nares w/o erythema or drainage Eyes: Conjunctiva clear. Sclera non-icteric Neck: Supple.  Trachea midline Pulmonary:  Good air movement, no use of accessory muscles.  Cardiac: RRR, no JVD Vascular:  Vessel Right Left  Radial Palpable Palpable               Musculoskeletal: M/S 5/5 throughout.  No deformity or atrophy.  No significant lower extremity edema. Neurologic: Sensation grossly intact in extremities.  Symmetrical.  Speech is fluent.  Psychiatric: Judgment intact, Mood & affect appropriate for pt's clinical situation. Dermatologic: No rashes or ulcers noted.  No cellulitis or open wounds.      Labs Recent Results (from the past 2160 hour(s))  CBC with Differential     Status: Abnormal   Collection Time:  11/02/22  7:42 PM  Result Value Ref Range   WBC 7.8 4.0 - 10.5 K/uL   RBC 3.61 (L) 3.87 - 5.11 MIL/uL   Hemoglobin 10.8 (L) 12.0 - 15.0 g/dL   HCT 13.0 (L) 86.5 - 78.4 %   MCV 92.8 80.0 - 100.0 fL   MCH 29.9 26.0 - 34.0 pg   MCHC 32.2 30.0 - 36.0 g/dL   RDW 69.6 29.5 - 28.4 %   Platelets 217 150 - 400 K/uL   nRBC 0.0 0.0 - 0.2 %   Neutrophils Relative % 48 %   Neutro Abs 3.8 1.7 - 7.7 K/uL   Lymphocytes Relative 36 %   Lymphs Abs 2.8 0.7 - 4.0 K/uL   Monocytes Relative 12 %   Monocytes Absolute 0.9 0.1 - 1.0 K/uL   Eosinophils Relative 3 %   Eosinophils Absolute 0.2 0.0 - 0.5 K/uL   Basophils Relative 1 %   Basophils Absolute 0.1 0.0 - 0.1 K/uL   Immature Granulocytes 0 %   Abs Immature Granulocytes 0.03 0.00 - 0.07 K/uL    Comment: Performed at [facility], [address] [place], [place] [address]  Comprehensive metabolic panel     Status: Abnormal   Collection Time: 11/02/22  7:42 PM  Result Value Ref Range   Sodium 135 135 - 145 mmol/L   Potassium 3.2 (L) 3.5 - 5.1 mmol/L   Chloride 98 98 - 111 mmol/L   CO2 26 22 - 32 mmol/L   Glucose, Bld 106 (H) 70 - 99 mg/dL    Comment: Glucose reference range applies only to samples taken after fasting for at least 8 hours.   BUN 34 (H) 8 - 23 mg/dL   Creatinine, Ser 0.10 (H) 0.44 - 1.00 mg/dL   Calcium 9.5 8.9 - 27.2 mg/dL   Total Protein 7.8 6.5 - 8.1 g/dL   Albumin  3.9 3.5 - 5.0 g/dL   AST 25 15 - 41 U/L   ALT 15 0 - 44 U/L   Alkaline Phosphatase 64 38 - 126 U/L   Total Bilirubin 0.5 0.3 - 1.2 mg/dL   GFR, Estimated 28 (L) >60 mL/min    Comment: (NOTE) Calculated using the CKD-EPI Creatinine Equation (2021)    Anion gap 11 5 - 15    Comment: Performed at [facility], [address] [place], [place] [address]  Troponin I (High Sensitivity)     Status: Abnormal   Collection Time: 11/02/22  7:47 PM  Result Value Ref Range   Troponin I (High Sensitivity) 19 (H) <18 ng/L    Comment: (NOTE) Elevated  high sensitivity troponin I (hsTnI) values and significant  changes across serial measurements may suggest ACS but many other  chronic and acute conditions are known to elevate hsTnI results.  Refer to the "Links" section for chest pain algorithms and additional  guidance. Performed at [facility], [address] [place], [place] [address]   CBC with Differential (Cancer Center Only)     Status: Abnormal   Collection Time: 11/17/22  1:04 PM  Result Value Ref Range   WBC Count 7.3 4.0 - 10.5 K/uL   RBC 3.76 (L) 3.87 - 5.11 MIL/uL   Hemoglobin 11.2 (L) 12.0 - 15.0 g/dL   HCT 01.0 (L) 27.2 - 53.6 %   MCV 91.8 80.0 - 100.0 fL   MCH 29.8 26.0 - 34.0 pg   MCHC 32.5 30.0 - 36.0 g/dL   RDW 64.4 03.4 - 74.2 %   Platelet Count 191 150 - 400 K/uL   nRBC 0.0 0.0 - 0.2 %   Neutrophils Relative % 55 %   Neutro Abs 4.0 1.7 - 7.7 K/uL   Lymphocytes Relative 32 %   Lymphs Abs 2.3 0.7 - 4.0 K/uL   Monocytes Relative 9 %   Monocytes Absolute 0.7 0.1 - 1.0 K/uL   Eosinophils Relative 3 %   Eosinophils Absolute 0.2 0.0 - 0.5 K/uL   Basophils Relative 1 %   Basophils Absolute 0.1 0.0 - 0.1 K/uL   Immature Granulocytes 0 %   Abs Immature Granulocytes 0.02 0.00 - 0.07 K/uL    Comment: Performed at [facility], [address] [place], [place] [address]  CMP (Cancer Center only)     Status: Abnormal   Collection Time: 11/17/22  1:04 PM  Result Value Ref Range   Sodium 139 135 - 145 mmol/L   Potassium 3.7 3.5 - 5.1 mmol/L   Chloride 103 98 - 111 mmol/L   CO2 26 22 - 32 mmol/L   Glucose, Bld 158 (H) 70 - 99 mg/dL    Comment: Glucose reference range applies only to samples taken after fasting for at least 8 hours.   BUN 38 (H) 8 - 23 mg/dL   Creatinine 8.75 (H) 6.43 - 1.00 mg/dL   Calcium 9.6 8.9 - 32.9 mg/dL   Total Protein 7.8 6.5 - 8.1 g/dL   Albumin 4.2 3.5 - 5.0 g/dL   AST 27 15 - 41 U/L   ALT 17 0 - 44 U/L   Alkaline Phosphatase 81 38 - 126 U/L   Total Bilirubin 0.6  0.3 - 1.2 mg/dL   GFR, Estimated 34 (L) >60 mL/min    Comment: (NOTE) Calculated using the CKD-EPI Creatinine Equation (2021)    Anion gap 10 5 - 15    Comment: Performed at [facility], [address], [place], [place] [address]  VITAMIN D 25 Hydroxy (Vit-D Deficiency, Fractures)     Status: None   Collection Time: 11/17/22  1:04 PM  Result Value Ref Range   Vit D, 25-Hydroxy 40.81 30 - 100 ng/mL    Comment: (NOTE) Vitamin D deficiency has been defined by the Institute of Medicine  and an Endocrine Society practice guideline as a level of serum 25-OH  vitamin D less than 20 ng/mL (1,2). The Endocrine Society went on to  further define vitamin D insufficiency as a level between 21 and 29  ng/mL (2).  1. IOM (Institute of Medicine). 2010. Dietary reference intakes for  calcium and D. [place]: [employer]. 2. Holick MF, Binkley [place], Bischoff-Ferrari HA, et al. Evaluation,  treatment, and prevention of vitamin D deficiency: an Endocrine  Society clinical practice guideline, JCEM. 2011 Jul; 96(7): 1911-30.  Performed at [facility], [address] [place], [place] [address]   Cancer antigen 15-3     Status: Abnormal   Collection Time: 11/17/22  1:04 PM  Result Value Ref Range   CA 15-3 32.0 (H) 0.0 - 25.0 U/mL    Comment: (NOTE) [employer] Electrochemiluminescence Immunoassay (ECLIA) Values obtained with different assay methods or kits cannot be used interchangeably.  Results cannot be interpreted as absolute evidence of the presence or absence of malignant disease. Performed At: [facility] [address] [place], [place] [id number] Jolene Schimke MD [id number]   CEA     Status: None   Collection Time: 11/17/22  1:04 PM  Result Value Ref Range   CEA 3.2 0.0 - 4.7 ng/mL    Comment: (NOTE)                             Nonsmokers          <3.9                             Smokers             <5.6 [employer]  Electrochemiluminescence Immunoassay (ECLIA) Values obtained with different assay methods or kits cannot be used interchangeably.  Results cannot be interpreted as absolute evidence of the presence or absence of malignant disease. Performed At: [facility] [address] [place], [place] [id number] Jolene Schimke MD [id number]   Cancer antigen 27.29     Status: None   Collection Time: 11/17/22  1:04 PM  Result Value Ref Range   CA 27.29 33.2 0.0 - 38.6 U/mL    Comment: (NOTE) Siemens Centaur Immunochemiluminometric Methodology [facility]) Values obtained with different assay methods or kits cannot be used interchangeably. Results cannot be interpreted as absolute evidence of the presence or absence of malignant disease. Performed At: [facility] [address] [place], [place] [id number] Jolene Schimke MD [id number]     Radiology VAS US RENAL ARTERY DUPLEX  Result Date: 12/11/2022 ABDOMINAL VISCERAL Patient Name:  Tina Curtis  Date of Exam:   12/11/2022 Medical Rec #: [id number]      Accession #:    [id number] Date of Birth: 1926/08/25     Patient Gender: F Patient Age:   56 years Exam Location:  [facility] Procedure:      VAS US RENAL ARTERY DUPLEX Referring Phys: [id number] Georgiana Spinner -------------------------------------------------------------------------------- Indications: AAA High Risk Factors: Hypertension, prior  CVA. Vascular Interventions: 04/11/2018 Right RA stent placed. Comparison Study: 04/2022 Performing Technologist: Salvadore Farber RVT  Examination Guidelines: A complete evaluation includes B-mode imaging, spectral Doppler, color Doppler, and power Doppler as needed of all accessible portions of each vessel. Bilateral testing is considered an integral part of a complete examination. Limited examinations for reoccurring indications may be performed as noted.  Duplex Findings: +------------+--------+--------+------+-----------+ Mesenteric  PSV  cm/sEDV cm/sPlaque Comments   +------------+--------+--------+------+-----------+ Aorta Prox                        1.98 x 1.93 +------------+--------+--------+------+-----------+ Aorta Mid      99                 3.54 x 3.70 +------------+--------+--------+------+-----------+ Aorta Distal                      1.40 x 1.57 +------------+--------+--------+------+-----------+    +------------------+--------+--------+-------+ Right Renal ArteryPSV cm/sEDV cm/sComment +------------------+--------+--------+-------+ Proximal             68                   +------------------+--------+--------+-------+ Mid                  59                   +------------------+--------+--------+-------+ Distal               48                   +------------------+--------+--------+-------+ +------------+--------+--------+----+-----------+--------+--------+---+ Right KidneyPSV cm/sEDV cm/sRI  Left KidneyPSV cm/sEDV cm/sRI  +------------+--------+--------+----+-----------+--------+--------+---+ Upper Pole                      Upper Pole                     +------------+--------+--------+----+-----------+--------+--------+---+ Mid         31      10      0.                            +------------+--------+--------+----+-----------+--------+--------+---+ Lower Pole                      Lower Pole                     +------------+--------+--------+----+-----------+--------+--------+---+ Hilar                           Hilar                          +------------+--------+--------+----+-----------+--------+--------+---+ +------------------+----+------------------++ Right Kidney          Left Kidney        +------------------+----+------------------++ RAR                   RAR                +------------------+----+------------------++ RAR (manual)      .69 RAR (manual)       +------------------+----+------------------++ Cortex                 Cortex             +------------------+----+------------------++ Cortex thickness      Corex thickness    +------------------+----+------------------++  Kidney length (cm)8.48Kidney length (cm) +------------------+----+------------------++  Summary: Largest Aortic Diameter: 3.7 cm  Renal:  Right: Normal size right kidney. Normal right Resisitive Index.        Normal cortical thickness of right kidney. 1-59% stenosis of        the right renal artery. RRV flow present. Left:  Atrophied left kidney history.  *See table(s) above for measurements and observations.  Diagnosing physician: Festus Barren MD  Electronically signed by Festus Barren MD on 12/11/2022 at 12:14:02 PM.    Final     Assessment/Plan  Anemia of chronic renal failure, stage 3b (HCC) Stable.  Avoid contrast unless absolutely necessary.  Renal artery stenosis (HCC) Duplex shows no hemodynamically stenosis right renal artery stent in her solitary right kidney.  Doing reasonably well.  Plan follow-up on 29-month intervals with duplex unless clinical scenario changes.    Dizziness and giddiness We did a carotid duplex earlier this year which showed essentially no evidence of stenosis in either internal carotid artery.  Festus Barren, MD  12/14/2022 8:18 AM    This note was created with Dragon medical transcription system.  Any errors from dictation are purely unintentional

## 2022-12-11 NOTE — Assessment & Plan Note (Signed)
Stable.  Avoid contrast unless absolutely necessary.

## 2022-12-14 NOTE — Assessment & Plan Note (Signed)
Duplex shows no hemodynamically stenosis right renal artery stent in her solitary right kidney.  Doing reasonably well.  Plan follow-up on 46-month intervals with duplex unless clinical scenario changes.

## 2022-12-18 ENCOUNTER — Telehealth: Payer: Self-pay | Admitting: *Deleted

## 2022-12-18 NOTE — Telephone Encounter (Signed)
Patient called to report muscle pain in her chest area. She states it started when she started using her new walker. She was concerned about a reoccurrence of her breast cancer  or a problem with her surgery. Patient is over a year out from surgery and has had a recent office visit with Consuello Masse NP. Writer advised patient to contact PCP regarding this new muscle soreness and to call back if she needed anything else.

## 2022-12-21 ENCOUNTER — Encounter: Payer: Self-pay | Admitting: Internal Medicine

## 2022-12-24 ENCOUNTER — Other Ambulatory Visit: Payer: Self-pay | Admitting: *Deleted

## 2022-12-24 MED ORDER — ANASTROZOLE 1 MG PO TABS: 1.0000 mg | ORAL_TABLET | Freq: Every day | ORAL | 1 refills | Status: DC

## 2023-01-13 ENCOUNTER — Other Ambulatory Visit (INDEPENDENT_AMBULATORY_CARE_PROVIDER_SITE_OTHER): Payer: Self-pay | Admitting: Vascular Surgery

## 2023-01-13 DIAGNOSIS — M79604 Pain in right leg: Secondary | ICD-10-CM

## 2023-01-15 ENCOUNTER — Ambulatory Visit (INDEPENDENT_AMBULATORY_CARE_PROVIDER_SITE_OTHER): Payer: Medicare PPO | Admitting: Nurse Practitioner

## 2023-01-15 ENCOUNTER — Ambulatory Visit (INDEPENDENT_AMBULATORY_CARE_PROVIDER_SITE_OTHER): Payer: Medicare PPO

## 2023-01-15 ENCOUNTER — Encounter (INDEPENDENT_AMBULATORY_CARE_PROVIDER_SITE_OTHER): Payer: Self-pay | Admitting: Nurse Practitioner

## 2023-01-15 VITALS — BP 167/82 | HR 71 | Resp 16

## 2023-01-15 DIAGNOSIS — I10 Essential (primary) hypertension: Secondary | ICD-10-CM | POA: Diagnosis not present

## 2023-01-15 DIAGNOSIS — M79605 Pain in left leg: Secondary | ICD-10-CM

## 2023-01-15 DIAGNOSIS — M79604 Pain in right leg: Secondary | ICD-10-CM

## 2023-01-15 DIAGNOSIS — I701 Atherosclerosis of renal artery: Secondary | ICD-10-CM | POA: Diagnosis not present

## 2023-01-17 ENCOUNTER — Encounter (INDEPENDENT_AMBULATORY_CARE_PROVIDER_SITE_OTHER): Payer: Self-pay | Admitting: Nurse Practitioner

## 2023-01-17 NOTE — Progress Notes (Signed)
Subjective:    Patient ID: Tina Curtis, female    [name]: 1927-01-11, 89 y.o.   MRN: [id number] Chief Complaint  Patient presents with   Follow-up    Ultrasound follow up    Tina Curtis is a 89 year old female who returns today to the office in order to evaluate for leg pain.  She notes that it is primarily present in her left leg.  The pain radiates from her ankle to her knee on the lateral portion.  She notes that it is present constantly.  She does not note that certain activities make it better or worse.  She notes that sometimes during the evening she has to get up and move her legs in order to help some of this discomfort.  She has a history of renal artery stenosis.  The patient also has strongly palpable pulses.  She also denies claudication-like symptoms or rest pain.  She denies any symptoms of limb ischemia.  She returns today for evaluation of her pains to determine possible venous insufficiency.  Today there is no evidence of DVT or superficial thrombophlebitis noted.    Review of Systems  Musculoskeletal:  Positive for arthralgias.  All other systems reviewed and are negative.      Objective:   Physical Exam Vitals reviewed.  HENT:     Head: Normocephalic.  Cardiovascular:     Rate and Rhythm: Normal rate.     Pulses: Normal pulses.          Dorsalis pedis pulses are 2+ on the right side and 2+ on the left side.  Pulmonary:     Effort: Pulmonary effort is normal.  Skin:    General: Skin is warm and dry.  Neurological:     Mental Status: She is alert and oriented to person, place, and time.     Motor: Weakness present.     Gait: Gait abnormal.  Psychiatric:        Mood and Affect: Mood normal.        Behavior: Behavior normal.        Thought Content: Thought content normal.        Judgment: Judgment normal.     BP (!) 167/82 (BP Location: Left Arm)   Pulse 71   Resp 16   Past Medical History:  Diagnosis Date   Arthritis    Gout   Chronic kidney  disease    Diarrhea    Hypercholesteremia    Hypertension    Neuropathy    feet and lower legs   Seasonal allergies    Skin cancer    face    Social History   Socioeconomic History   Marital status: Widowed    Spouse name: Not on file   Number of children: Not on file   Years of education: Not on file   Highest education level: Not on file  Occupational History   Not on file  Tobacco Use   Smoking status: Never   Smokeless tobacco: Never  Vaping Use   Vaping status: Never Used  Substance and Sexual Activity   Alcohol use: No   Drug use: Never   Sexual activity: Not on file  Other Topics Concern   Not on file  Social History Narrative   2 daughters; lives in [address]; Runner, broadcasting/film/video retd. 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    Social Determinants of Health   Financial Resource Strain: Low Risk  (11/17/2022)   Received from [facility],  [facility]   Overall Financial Resource Strain (CARDIA)    Difficulty of Paying Living Expenses: Not hard at all  Food Insecurity: No Food Insecurity (11/17/2022)   Received from [facility], [facility]   Hunger Vital Sign    Worried About Running Out of Food in the Last Year: Never true    Ran Out of Food in the Last Year: Never true  Transportation Needs: No Transportation Needs (11/17/2022)   Received from [facility], [facility]   [facility] - Transportation    In the past 12 months, has lack of transportation kept you from medical appointments or from getting medications?: No    Lack of Transportation (Non-Medical): No  Physical Activity: Not on file  Stress: Not on file  Social Connections: Not on file  Intimate Partner Violence: Not At Risk (08/27/2022)   Humiliation, Afraid, Rape, and Kick questionnaire    Fear of Current or Ex-Partner: No    Emotionally Abused: No    Physically Abused: No    Sexually  Abused: No    Past Surgical History:  Procedure Laterality Date   ABDOMINAL HYSTERECTOMY  2000   APPENDECTOMY     BREAST BIOPSY Right 09/19/2019   affirm bx of calcs UOQ, x marker, path pending   BREAST BIOPSY Right 09/19/2019   [place] bx of mass,heart marker, path pending   BREAST BIOPSY Right 09/19/2019   [place] bx of LN, coil marker, path pending   CATARACT EXTRACTION Left    CATARACT EXTRACTION W/PHACO Right 10/24/2014   Procedure: CATARACT EXTRACTION PHACO AND INTRAOCULAR LENS PLACEMENT (IOC);  Surgeon: Lockie Mola, MD;  Location: [facility];  Service: Ophthalmology;  Laterality: Right;   ESOPHAGOGASTRODUODENOSCOPY N/A 03/07/2018   Procedure: ESOPHAGOGASTRODUODENOSCOPY (EGD);  Surgeon: Toney Reil, MD;  Location: [facility] ENDOS[facility];  Service: Gastroenterology;  Laterality: N/A;   MASTECTOMY MODIFIED RADICAL Right 10/13/2019   Procedure: MASTECTOMY MODIFIED RADICAL;  Surgeon: Earline Mayotte, MD;  Location: [facility] ORS;  Service: General;  Laterality: Right;   RENAL ANGIOGRAPHY Right 04/11/2018   Procedure: RENAL ANGIOGRAPHY;  Surgeon: Annice Needy, MD;  Location: [facility] INVASIVE CV LAB;  Service: Cardiovascular;  Laterality: Right;   SIMPLE MASTECTOMY WITH AXILLARY SENTINEL NODE BIOPSY Left 10/13/2019   Procedure: SIMPLE MASTECTOMY TRUE CUT BIOPSY, SENTINEL NODE BIOPSY;  Surgeon: Earline Mayotte, MD;  Location: [facility] ORS;  Service: General;  Laterality: Left;    Family History  Problem Relation Age of Onset   Hypertension Mother    Hypertension Father    Stroke Father    Breast cancer Daughter 28    Allergies  Allergen Reactions   Penicillins Anaphylaxis   Drug Ingredient [Zinc]    Empagliflozin Rash       Latest Ref Rng & Units 11/17/2022    1:04 PM 11/02/2022    7:42 PM 09/09/2022   12:00 AM  CBC  WBC 4.0 - 10.5 K/uL 7.3  7.8  7.5      Hemoglobin 12.0 - 15.0 g/dL 16.1  09.6  04.5      Hematocrit 36.0 - 46.0 % 34.5  33.5  32      Platelets 150 - 400 K/uL 191   217  460         This result is from an external source.      CMP     Component Value Date/Time   NA 139 11/17/2022 1304   NA 135 (A)  09/11/2022 0000   K 3.7 11/17/2022 1304   CL 103 11/17/2022 1304   CO2 26 11/17/2022 1304   GLUCOSE 158 (H) 11/17/2022 1304   BUN 38 (H) 11/17/2022 1304   BUN 23 (A) 09/11/2022 0000   CREATININE 1.44 (H) 11/17/2022 1304   CALCIUM 9.6 11/17/2022 1304   PROT 7.8 11/17/2022 1304   ALBUMIN 4.2 11/17/2022 1304   AST 27 11/17/2022 1304   ALT 17 11/17/2022 1304   ALKPHOS 81 11/17/2022 1304   BILITOT 0.6 11/17/2022 1304   EGFR 41 09/11/2022 0000   GFRNONAA 34 (L) 11/17/2022 1304     No results found.     Assessment & Plan:   1. Bilateral leg pain Recommend:  The patient has atypical pain symptoms for vascular disease and on exam I do not find evidence of vascular pathology that would explain the patient's symptoms.  Noninvasive studies do not identify significant vascular problems  Given the patient's description of symptoms as well as her readily palpable pulses, I suspect that is may be related to neuropathy or even possible arthritic pain.  She has established with Dr. Sherryll Burger and so we will refer the patient to neurology for evaluation. - Ambulatory referral to Neurology  2. Renal artery stenosis (HCC) Renal arteries are patent following evaluation last month.  Will continue to maintain her her 57-month schedule and reevaluate in 5 months.  3. Benign essential hypertension Continue antihypertensive medications as already ordered, these medications have been reviewed and there are no changes at this time.   Current Outpatient Medications on File Prior to Visit  Medication Sig Dispense Refill   anastrozole (ARIMIDEX) 1 MG tablet Take 1 tablet (1 mg total) by mouth daily. 90 tablet 1   aspirin 81 MG EC tablet Take 81 mg by mouth daily.     calcitRIOL (ROCALTROL) 0.25 MCG capsule Take 0.25 mcg by mouth every other day. Monday,Wednesday,  Friday     calcium carbonate (OS-CAL - DOSED IN MG OF ELEMENTAL CALCIUM) 1250 (500 Ca) MG tablet Take 2 tablets by mouth 4 (four) times daily.     citalopram (CELEXA) 10 MG tablet Take 10 mg by mouth daily.      clopidogrel (PLAVIX) 75 MG tablet TAKE 1 TABLET BY MOUTH EVERY DAY 30 tablet 1   cyanocobalamin (VITAMIN B12) 1000 MCG tablet Take 1,000 mcg by mouth daily.     docusate sodium (COLACE) 100 MG capsule Take 100 mg by mouth daily as needed for mild constipation.     DULoxetine (CYMBALTA) 60 MG capsule Take 60 mg by mouth daily.      furosemide (LASIX) 40 MG tablet Take 1 tablet (40 mg total) by mouth daily. 30 tablet 0   gabapentin (NEURONTIN) 300 MG capsule Take 300 mg by mouth 2 (two) times daily.     hydrALAZINE (APRESOLINE) 25 MG tablet Take 25 mg by mouth 3 (three) times daily.     isosorbide mononitrate (IMDUR) 30 MG 24 hr tablet Take 1 tablet (30 mg total) by mouth daily.     lidocaine (LIDODERM) 5 % Place 1 patch onto the skin daily. Remove & Discard patch within 12 hours or as directed by MD     loratadine (CLARITIN) 10 MG tablet Take 10 mg by mouth daily as needed for allergies. AM     metoprolol succinate (TOPROL-XL) 25 MG 24 hr tablet Take 37.5 mg by mouth at bedtime.      Multiple Vitamins-Minerals (ICAPS AREDS 2 PO) Take 2 capsules  by mouth daily.     multivitamin (PROSIGHT) TABS tablet Take 1 tablet by mouth daily. 30 tablet 0   pantoprazole (PROTONIX) 40 MG tablet Take 1 tablet (40 mg total) by mouth daily.     simvastatin (ZOCOR) 20 MG tablet Take 20 mg by mouth every evening. PM     triamcinolone cream (KENALOG) 0.1 % Apply 1 Application topically 2 (two) times daily.     valsartan (DIOVAN) 40 MG tablet Take 40 mg by mouth daily.     Vitamin D, Ergocalciferol, (DRISDOL) 1.25 MG (50000 UNIT) CAPS capsule Take 50,000 Units by mouth every 7 (seven) days.     Zinc Oxide (TRIPLE PASTE) 12.8 % ointment Apply 1 Application topically as directed. Apply to buttocks every shift for  skin protection     albuterol (VENTOLIN HFA) 108 (90 Base) MCG/ACT inhaler Inhale 2 puffs into the lungs every 6 (six) hours as needed for wheezing or shortness of breath. (Patient not taking: Reported on 12/11/2022) 8 g 0   traMADol (ULTRAM) 50 MG tablet Take 1 tablet (50 mg total) by mouth every 12 (twelve) hours as needed for moderate pain. (Patient not taking: Reported on 12/11/2022) 10 tablet 0   No current facility-administered medications on file prior to visit.    There are no Patient Instructions on file for this visit. No follow-ups on file.   Georgiana Spinner, NP

## 2023-01-27 ENCOUNTER — Ambulatory Visit: Payer: Medicare PPO | Admitting: Podiatry

## 2023-01-27 DIAGNOSIS — D2371 Other benign neoplasm of skin of right lower limb, including hip: Secondary | ICD-10-CM | POA: Diagnosis not present

## 2023-01-27 DIAGNOSIS — M79674 Pain in right toe(s): Secondary | ICD-10-CM | POA: Diagnosis not present

## 2023-01-27 DIAGNOSIS — M79675 Pain in left toe(s): Secondary | ICD-10-CM

## 2023-01-27 DIAGNOSIS — D2372 Other benign neoplasm of skin of left lower limb, including hip: Secondary | ICD-10-CM

## 2023-01-27 DIAGNOSIS — D689 Coagulation defect, unspecified: Secondary | ICD-10-CM | POA: Diagnosis not present

## 2023-01-27 DIAGNOSIS — B351 Tinea unguium: Secondary | ICD-10-CM

## 2023-01-27 NOTE — Progress Notes (Signed)
She presents today chief complaint of painful elongated toenails 1 through 5 bilateral multiple calluses bilateral.  Objective: Pulses are palpable nails are thick yellow dystrophic elongated and painful.  Painful benign skin lesions subfirst third and fifth metatarsals on the right foot.  No open lesions or wounds.  Assessment: Pain limb secondary to onychomycosis neuropathy and benign skin lesions.  Plan: Debrided benign skin lesion debrided nails 1 through 5 bilateral follow-up with her in 2 months

## 2023-02-17 ENCOUNTER — Other Ambulatory Visit: Payer: Medicare PPO

## 2023-02-17 ENCOUNTER — Ambulatory Visit: Payer: Medicare PPO

## 2023-02-17 ENCOUNTER — Ambulatory Visit: Payer: Medicare PPO | Admitting: Internal Medicine

## 2023-02-19 ENCOUNTER — Other Ambulatory Visit: Payer: Self-pay | Admitting: *Deleted

## 2023-02-19 DIAGNOSIS — Z170 Estrogen receptor positive status [ER+]: Secondary | ICD-10-CM

## 2023-02-22 ENCOUNTER — Inpatient Hospital Stay: Payer: Medicare PPO | Admitting: Internal Medicine

## 2023-02-22 ENCOUNTER — Encounter: Payer: Self-pay | Admitting: Internal Medicine

## 2023-02-22 ENCOUNTER — Inpatient Hospital Stay: Payer: Medicare PPO | Attending: Internal Medicine

## 2023-02-22 ENCOUNTER — Inpatient Hospital Stay: Payer: Medicare PPO

## 2023-02-22 DIAGNOSIS — E559 Vitamin D deficiency, unspecified: Secondary | ICD-10-CM | POA: Diagnosis not present

## 2023-02-22 DIAGNOSIS — C50211 Malignant neoplasm of upper-inner quadrant of right female breast: Secondary | ICD-10-CM | POA: Diagnosis not present

## 2023-02-22 DIAGNOSIS — Z170 Estrogen receptor positive status [ER+]: Secondary | ICD-10-CM | POA: Insufficient documentation

## 2023-02-22 DIAGNOSIS — C773 Secondary and unspecified malignant neoplasm of axilla and upper limb lymph nodes: Secondary | ICD-10-CM | POA: Diagnosis not present

## 2023-02-22 DIAGNOSIS — N184 Chronic kidney disease, stage 4 (severe): Secondary | ICD-10-CM | POA: Insufficient documentation

## 2023-02-22 DIAGNOSIS — M810 Age-related osteoporosis without current pathological fracture: Secondary | ICD-10-CM | POA: Diagnosis present

## 2023-02-22 DIAGNOSIS — Z79811 Long term (current) use of aromatase inhibitors: Secondary | ICD-10-CM | POA: Insufficient documentation

## 2023-02-22 DIAGNOSIS — C50811 Malignant neoplasm of overlapping sites of right female breast: Secondary | ICD-10-CM | POA: Insufficient documentation

## 2023-02-22 LAB — CMP (CANCER CENTER ONLY)
ALT: 14 U/L (ref 0–44)
AST: 24 U/L (ref 15–41)
Albumin: 4.1 g/dL (ref 3.5–5.0)
Alkaline Phosphatase: 62 U/L (ref 38–126)
Anion gap: 6 (ref 5–15)
BUN: 34 mg/dL — ABNORMAL HIGH (ref 8–23)
CO2: 27 mmol/L (ref 22–32)
Calcium: 10.3 mg/dL (ref 8.9–10.3)
Chloride: 105 mmol/L (ref 98–111)
Creatinine: 1.76 mg/dL — ABNORMAL HIGH (ref 0.44–1.00)
GFR, Estimated: 27 mL/min — ABNORMAL LOW (ref >60)
Glucose, Bld: 114 mg/dL — ABNORMAL HIGH (ref 70–99)
Potassium: 4.1 mmol/L (ref 3.5–5.1)
Sodium: 138 mmol/L (ref 135–145)
Total Bilirubin: 0.4 mg/dL (ref 0.3–1.2)
Total Protein: 7.5 g/dL (ref 6.5–8.1)

## 2023-02-22 LAB — CBC WITH DIFFERENTIAL (CANCER CENTER ONLY)
Abs Immature Granulocytes: 0.03 10*3/uL (ref 0.00–0.07)
Basophils Absolute: 0.1 10*3/uL (ref 0.0–0.1)
Basophils Relative: 1 %
Eosinophils Absolute: 0.2 10*3/uL (ref 0.0–0.5)
Eosinophils Relative: 3 %
HCT: 33.9 % — ABNORMAL LOW (ref 36.0–46.0)
Hemoglobin: 11 g/dL — ABNORMAL LOW (ref 12.0–15.0)
Immature Granulocytes: 0 %
Lymphocytes Relative: 39 %
Lymphs Abs: 2.7 10*3/uL (ref 0.7–4.0)
MCH: 30.1 pg (ref 26.0–34.0)
MCHC: 32.4 g/dL (ref 30.0–36.0)
MCV: 92.6 fL (ref 80.0–100.0)
Monocytes Absolute: 0.8 10*3/uL (ref 0.1–1.0)
Monocytes Relative: 11 %
Neutro Abs: 3.1 10*3/uL (ref 1.7–7.7)
Neutrophils Relative %: 46 %
Platelet Count: 233 10*3/uL (ref 150–400)
RBC: 3.66 MIL/uL — ABNORMAL LOW (ref 3.87–5.11)
RDW: 15.3 % (ref 11.5–15.5)
WBC Count: 6.9 10*3/uL (ref 4.0–10.5)
nRBC: 0 % (ref 0.0–0.2)

## 2023-02-22 LAB — VITAMIN D 25 HYDROXY (VIT D DEFICIENCY, FRACTURES): Vit D, 25-Hydroxy: 65.86 ng/mL (ref 30–100)

## 2023-02-22 MED ORDER — DENOSUMAB 60 MG/ML ~~LOC~~ SOSY
60.0000 mg | PREFILLED_SYRINGE | Freq: Once | SUBCUTANEOUS | Status: AC
  Administered 2023-02-22: 60 mg via SUBCUTANEOUS

## 2023-02-22 NOTE — Assessment & Plan Note (Addendum)
#   Synchronous bilateral breast cancer-status post bilateral mastectomies- Right Breast-stage III ER/PR positive HER-2 NEG; & Left breast cancer stage II ER/PR Pos; Her 2 NEG. PET scan- April 20th, 2023-  Interval bilateral mastectomy and right axillary node dissection. No evidence of chest wall recurrence or thoracic metastatic disease.  Questionable small hypermetabolic lesion centrally in the right hepatic lobe; MRI LIVER  MAY 25th, 2023- NEGATIVE for any cancer; but cysts [see below].    # CONTINUE  anastrozole. Recommend 5- 10 years. Bone scan if pain not any getting any better.    # MAY 2023[facility]]-  Multi cystic lesions of the pancreas with enlargement measuring 2.6 cm greatest axial dimension; with relative stability since 2021. without high-risk features aside from enlargement since 2019. Recommend in may in 2025. Stable.   # chronic headaches-/tremors/peripheral neuropathy ? [facility] Echevar[facility] Dr.Shah > 4 years];  MRI- Brain May  2023-  No acute intracranial pathology. S/p evaluation with Dr.Shah- on baclofen. Stable.   # JAN 2024- [hospital] acute hypoxic respiratory failure. 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 Stable.   # Elevated BG- PBF- 108 [Dr.Baboff- HbA1C- 7.4]; not on medications.  Defer to PCP re:  management of DM-  Stable.   # Mild-moderate arthritic pain-osteoarthritis [bil knee L >R] G-1-2; continue tylenol prn.  Stable.   # vit D def [on ergocalciferol]--Stable.   # Fatigue ? Etiology; s/p Evaluation with Marisue Humble- s/p CARE program- Stable.   # CKD- Stage- IV- GFR 29 [Dr.Kolluru]; -Stable.   # NOV 2021- OsteoporosisT-scoreof -3.7.on ca+vit D. Prolia q 6 months [Feb 2023]- BMD- April 2024- stable.     Prolia q 4m  # DISPOSITION:  # proceed with prolia.  # follow up in 3 month- MD; labs- cbc/cmp; ca27;29; CEA; ca15-3; 25-OH vit D--  Dr.B  Cc; Dr.Baboff

## 2023-02-22 NOTE — Progress Notes (Signed)
C/o b/l leg pain and tingling, lt greater than rt. She feels like Dr. Sherryll Burger not addressing this. Pain more so at bedtime. I do see a dx of restless legs. Gabapentin doesn't help too much.  C/o weakness and dizziness when standing.  Boost 1/day. Niece states she doesn't have an appetite, pt states 100%.  Mobility is getting worse. Niece states not walking as much.

## 2023-02-22 NOTE — Patient Instructions (Signed)
#  Compression stockings of left leg.  Wear them during daytime.  Take them off at night; and keep the legs propped up in bed. You can find them online or at stores- like [employer], [place]

## 2023-02-22 NOTE — Progress Notes (Signed)
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 Burnett] stage III T2N3 [19/24 LN]; ER/PR positive HER-2 negative.   # Left breast-invasive mammary carcinoma-status post mastectomy [id number]; stage I-ER/PR positive HER-2 negative  #November 23, 2019-start adjuvant radiation bilateral [finished August 31]; no chemo;   #September 22nd 2021- anastrozole ----------------------------------------------------------------------------------------   A. RIGHT BREAST, 3:00; ULTRASOUND-GUIDED BIOPSY:  - INVASIVE MAMMARY CARCINOMA WITH MICROPAPILLARY FEATURES; Size of invasive carcinoma: 10mm in this sample  Histologic grade of invasive carcinoma: Grade 2 ; ER/PR > 905: her 2- NEG  A. RIGHT BREAST, 3:00; ULTRASOUND-GUIDED BIOPSY: - INVASIVE MAMMARY CARCINOMA WITH MICROPAPILLARY FEATURES. 10mm in this sample. Grade 2. Lymphovascular invasion: Present.   B. LYMPH NODE, RIGHT AXILLARY; ULTRASOUND-GUIDED NEEDLE CORE BIOPSY: - METASTATIC MAMMARY CARCINOMA, 6 MM IN THIS SAMPLE.   C. RIGHT BREAST, LATERAL CALCIFICATIONS; STEREOTACTIC BIOPSY: - DUCTAL CARCINOMA IN SITU, INTERMEDIATE GRADE, WITH CALCIFICATIONS. -------------------------------------------------------------   # CKD- Stage III-IV [Dr.Kolluru]  # SURVIVORSHIP:   # GENETICS:   DIAGNOSIS:  Right breast cancer stage III;  left breast cancer-stage I GOALS: Cure  CURRENT/MOST RECENT THERAPY : Anastrozole     Carcinoma of upper-inner quadrant of right breast in female, estrogen receptor positive (HCC)  09/27/2019 Initial Diagnosis   Carcinoma of upper-inner quadrant of right breast in female, estrogen receptor positive (HCC)      HISTORY OF PRESENTING ILLNESS: Ambulating walking with a rolling walker.  With cousin.  Tina Curtis 89 y.o.  female synchronous bilateral breast cancer [right stage III ER/PR positive;Her2 NEG; left stage I ER/PR positive; Her2- NEG] s/p adjuvant radiation; currently on Anastrazole is here for follow-up.   C/o  b/l leg pain and tingling, lt greater than rt.. Pain more so at bedtime. Gabapentin doesn't help too much. C/o weakness and dizziness when standing.   Otherwise patient denies breast pain, has tenderness with palpation. Denies hot flashes. Appetite is fine. Taking her anastrozole    Review of Systems  Constitutional:  Positive for malaise/fatigue. Negative for chills, diaphoresis, fever and weight loss.  HENT:  Negative for nosebleeds and sore throat.   Eyes:  Negative for double vision.  Respiratory:  Negative for cough, hemoptysis, sputum production, shortness of breath and wheezing.   Cardiovascular:  Negative for chest pain, palpitations, orthopnea and leg swelling.  Gastrointestinal:  Negative for abdominal pain, blood in stool, constipation, diarrhea, heartburn, melena, nausea and vomiting.  Genitourinary:  Negative for dysuria, frequency and urgency.  Musculoskeletal:  Positive for back pain and joint pain.  Neurological:  Negative for dizziness, tingling, focal weakness, weakness and headaches.  Endo/Heme/Allergies:  Does not bruise/bleed easily.  Psychiatric/Behavioral:  Negative for depression. The patient is not nervous/anxious and does not have insomnia.      MEDICAL HISTORY:  Past Medical History:  Diagnosis Date   Arthritis    Gout   Chronic kidney disease    Diarrhea    Hypercholesteremia    Hypertension    Neuropathy    feet and  lower legs   Seasonal allergies    Skin cancer    face  SURGICAL HISTORY: Past Surgical History:  Procedure Laterality Date   ABDOMINAL HYSTERECTOMY  2000   APPENDECTOMY     BREAST BIOPSY Right 09/19/2019   affirm bx of calcs UOQ, x marker, path pending   BREAST BIOPSY Right 09/19/2019   [place] bx of mass,heart marker, path pending   BREAST BIOPSY Right 09/19/2019   [place] bx of LN, coil marker, path pending   CATARACT EXTRACTION Left    CATARACT EXTRACTION W/PHACO Right 10/24/2014   Procedure: CATARACT EXTRACTION PHACO AND INTRAOCULAR LENS PLACEMENT (IOC);  Surgeon: Lockie Mola, MD;  Location: [facility];  Service: Ophthalmology;  Laterality: Right;   ESOPHAGOGASTRODUODENOSCOPY N/A 03/07/2018   Procedure: ESOPHAGOGASTRODUODENOSCOPY (EGD);  Surgeon: Toney Reil, MD;  Location: [facility];  Service: Gastroenterology;  Laterality: N/A;   MASTECTOMY MODIFIED RADICAL Right 10/13/2019   Procedure: MASTECTOMY MODIFIED RADICAL;  Surgeon: Earline Mayotte, MD;  Location: [facility];  Service: General;  Laterality: Right;   RENAL ANGIOGRAPHY Right 04/11/2018   Procedure: RENAL ANGIOGRAPHY;  Surgeon: Annice Needy, MD;  Location: [facility];  Service: Cardiovascular;  Laterality: Right;   SIMPLE MASTECTOMY WITH AXILLARY SENTINEL NODE BIOPSY Left 10/13/2019   Procedure: SIMPLE MASTECTOMY TRUE CUT BIOPSY, SENTINEL NODE BIOPSY;  Surgeon: Earline Mayotte, MD;  Location: [facility];  Service: General;  Laterality: Left;    SOCIAL HISTORY: Social History   Socioeconomic History   Marital status: Widowed    Spouse name: Not on file   Number of children: Not on file   Years of education: Not on file   Highest education level: Not on file  Occupational History   Not on file  Tobacco Use   Smoking status: Never   Smokeless tobacco: Never  Vaping Use   Vaping status: Never Used  Substance and Sexual Activity   Alcohol use: No   Drug use: Never   Sexual activity:  Not on file  Other Topics Concern   Not on file  Social History Narrative   2 daughters; lives in [address]; Runner, broadcasting/film/video retd. 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    Social Determinants of Health   Financial Resource Strain: Low Risk  (11/17/2022)   Received from [facility], [facility]   Overall Financial Resource Strain (CARDIA)    Difficulty of Paying Living Expenses: Not hard at all  Food Insecurity: No Food Insecurity (11/17/2022)   Received from [facility], [facility]   Hunger Vital Sign    Worried About Running Out of Food in the Last Year: Never true    Ran Out of Food in the Last Year: Never true  Transportation Needs: No Transportation Needs (11/17/2022)   Received from [facility], [facility], [facility]   [facility] - Transportation    In the past 12 months, has lack of transportation kept you from medical appointments or from getting medications?: No    Lack of Transportation (Non-Medical): No  Physical Activity: Not on file  Stress: Not on file  Social Connections: Not on file  Intimate Partner Violence: Not At Risk (08/27/2022)   Humiliation, Afraid, Rape, and Kick questionnaire    Fear of Current or Ex-Partner: No    Emotionally Abused: No    Physically Abused: No    Sexually Abused: No    FAMILY HISTORY: Family History  Problem Relation Age of Onset   Hypertension Mother    Hypertension Father    Stroke Father  Breast cancer Daughter 87    ALLERGIES:  is allergic to penicillins, drug ingredient [zinc], and empagliflozin.  MEDICATIONS:  Current Outpatient Medications  Medication Sig Dispense Refill   anastrozole (ARIMIDEX) 1 MG tablet Take 1 tablet (1 mg total) by mouth daily. 90 tablet 1   aspirin 81 MG EC tablet Take 81 mg by mouth daily.     calcitRIOL (ROCALTROL) 0.25 MCG capsule Take 0.25 mcg by mouth every other day. Monday,Wednesday, Friday      calcium carbonate (OS-CAL - DOSED IN MG OF ELEMENTAL CALCIUM) 1250 (500 Ca) MG tablet Take 2 tablets by mouth 4 (four) times daily.     citalopram (CELEXA) 10 MG tablet Take 10 mg by mouth daily.      clopidogrel (PLAVIX) 75 MG tablet TAKE 1 TABLET BY MOUTH EVERY DAY 30 tablet 1   cyanocobalamin (VITAMIN B12) 1000 MCG tablet Take 1,000 mcg by mouth daily.     docusate sodium (COLACE) 100 MG capsule Take 100 mg by mouth daily as needed for mild constipation.     DULoxetine (CYMBALTA) 60 MG capsule Take 100 mg by mouth daily.     gabapentin (NEURONTIN) 300 MG capsule Take 300 mg by mouth 2 (two) times daily.     hydrALAZINE (APRESOLINE) 25 MG tablet Take 25 mg by mouth 3 (three) times daily.     loratadine (CLARITIN) 10 MG tablet Take 10 mg by mouth daily as needed for allergies. AM     metoprolol succinate (TOPROL-XL) 25 MG 24 hr tablet Take 25 mg by mouth at bedtime.     simvastatin (ZOCOR) 20 MG tablet Take 20 mg by mouth every evening. PM     triamcinolone cream (KENALOG) 0.1 % Apply 1 Application topically 2 (two) times daily.     valsartan (DIOVAN) 40 MG tablet Take 40 mg by mouth daily.     pantoprazole (PROTONIX) 40 MG tablet Take 1 tablet (40 mg total) by mouth daily. (Patient not taking: Reported on 02/22/2023)     Vitamin D, Ergocalciferol, (DRISDOL) 1.25 MG (50000 UNIT) CAPS capsule Take 50,000 Units by mouth every 7 (seven) days.     No current facility-administered medications for this visit.      Marland Kitchen  PHYSICAL EXAMINATION: ECOG PERFORMANCE STATUS: 0 - Asymptomatic  Vitals:   02/22/23 1454 02/22/23 1517  BP: (!) 152/71 (!) 122/59  Pulse: 80   Temp: (!) 97.1 F (36.2 C)   SpO2: 100%      Filed Weights   02/22/23 1454  Weight: 135 lb (61.2 kg)      Physical Exam Constitutional:      Comments: Walks with a rolling walker.  Alone.  HENT:     Head: Normocephalic and atraumatic.     Mouth/Throat:     Pharynx: No oropharyngeal exudate.  Eyes:     Pupils:  Pupils are equal, round, and reactive to light.  Cardiovascular:     Rate and Rhythm: Normal rate and regular rhythm.  Pulmonary:     Effort: Pulmonary effort is normal. No respiratory distress.     Breath sounds: Normal breath sounds. No wheezing.  Abdominal:     General: Bowel sounds are normal. There is no distension.     Palpations: Abdomen is soft. There is no mass.     Tenderness: There is no abdominal tenderness. There is no guarding or rebound.  Musculoskeletal:        General: No tenderness. Normal range of motion.     Cervical  back: Normal range of motion and neck supple.  Skin:    General: Skin is warm.     Comments: Bruising bruising noted on the chest and left arm.  Neurological:     Mental Status: She is alert and oriented to person, place, and time.  Psychiatric:        Mood and Affect: Affect normal.     Results for Tina, Curtis (MRN [id number]) as of 01/15/2021 15:37  Ref. Range 08/27/2020 09:55 09/02/2020 12:32 10/22/2020 14:01 12/04/2020 14:35 01/15/2021 14:50  CA 15-3 Latest Ref Range: 0.0 - 25.0 U/mL   32.0 (H) 27.9 (H)   CA 27.29 Latest Ref Range: 0.0 - 38.6 U/mL   38.0 27.8   CEA Latest Ref Range: 0.0 - 4.7 ng/mL   4.1 3.6    LABORATORY DATA:  I have reviewed the data as listed Lab Results  Component Value Date   WBC 6.9 02/22/2023   HGB 11.0 (L) 02/22/2023   HCT 33.9 (L) 02/22/2023   MCV 92.6 02/22/2023   PLT 233 02/22/2023   Recent Labs    06/05/22 2038 06/06/22 0441 09/08/22 0913 09/09/22 0000 11/02/22 1942 11/17/22 1304 02/22/23 1444  NA  --    < > 131*   < > 135 139 138  K  --    < > 3.7   < > 3.2* 3.7 4.1  CL  --    < > 101   < > 98 103 105  CO2  --    < > 23   < > 26 26 27   GLUCOSE  --    < > 154*  --  106* 158* 114*  BUN  --    < > 19   < > 34* 38* 34*  CREATININE  --    < > 1.41*   < > 1.69* 1.44* 1.76*  CALCIUM  --    < > 7.6*   < > 9.5 9.6 10.3  GFRNONAA  --    < > 35*  --  28* 34* 27*  PROT 7.4   < > 6.8  --  7.8 7.8 7.5  ALBUMIN  4.0   < > 2.7*   < > 3.9 4.2 4.1  AST 27   < > 62*   < > 25 27 24   ALT 19   < > 34   < > 15 17 14   ALKPHOS 95   < > 111   < > 64 81 62  BILITOT 0.7   < > 0.6  --  0.5 0.6 0.4  BILIDIR <0.1  --  <0.1  --   --   --   --   IBILI NOT CALCULATED  --  NOT CALCULATED  --   --   --   --    < > = values in this interval not displayed.          RADIOGRAPHIC STUDIES: I have personally reviewed the radiological images as listed and agreed with the findings in the report. No results found.  ASSESSMENT & PLAN:   Carcinoma of upper-inner quadrant of right breast in female, estrogen receptor positive (HCC)  # Synchronous bilateral breast cancer-status post bilateral mastectomies- Right Breast-stage III ER/PR positive HER-2 NEG; & Left breast cancer stage II ER/PR Pos; Her 2 NEG. PET scan- April 20th, 2023-  Interval bilateral mastectomy and right axillary node dissection. No evidence of chest wall recurrence or thoracic metastatic disease.  Questionable small hypermetabolic lesion  centrally in the right hepatic lobe; MRI LIVER  MAY 25th, 2023- NEGATIVE for any cancer; but cysts [see below].    # CONTINUE  anastrozole. Recommend 5- 10 years. Bone scan if pain not any getting any better.    # MAY 2023[facility]]-  Multi cystic lesions of the pancreas with enlargement measuring 2.6 cm greatest axial dimension; with relative stability since 2021. without high-risk features aside from enlargement since 2019. Recommend in may in 2025. Stable.   # chronic headaches-/tremors/peripheral neuropathy ? [facility]ie Echevaria Dr.Shah > 4 years];  MRI- Brain May  2023-  No acute intracranial pathology. S/p evaluation with Dr.Shah- on baclofen. Stable.   # JAN 2024- [hospital] acute hypoxic respiratory failure. 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 Stable.   # Elevated BG- PBF- 108 [Dr.Baboff- HbA1C- 7.4]; not on  medications.  Defer to PCP re:  management of DM-  Stable.   # Mild-moderate arthritic pain-osteoarthritis [bil knee L >R] G-1-2; continue tylenol prn.  Stable.   # vit D def [on ergocalciferol]--Stable.   # Fatigue ? Etiology; s/p Evaluation with Marisue Humble- s/p CARE program- Stable.   # CKD- Stage- IV- GFR 29 [Dr.Kolluru]; -Stable.   # NOV 2021- OsteoporosisT-scoreof -3.7.on ca+vit D. Prolia q 6 months [Feb 2023]- BMD- April 2024- stable.     Prolia q 64m  # DISPOSITION:  # proceed with prolia.  # follow up in 3 month- MD; labs- cbc/cmp; ca27;29; CEA; ca15-3; 25-OH vit D--  Dr.B  Cc; [email or web address]   Earna Coder, MD 03/01/2023 8:40 PM

## 2023-02-23 LAB — CANCER ANTIGEN 27.29: CA 27.29: 28.4 U/mL (ref 0.0–38.6)

## 2023-02-23 LAB — CANCER ANTIGEN 15-3: CA 15-3: 26.5 U/mL — ABNORMAL HIGH (ref 0.0–25.0)

## 2023-02-23 LAB — CEA: CEA: 3.4 ng/mL (ref 0.0–4.7)

## 2023-03-01 ENCOUNTER — Encounter: Payer: Self-pay | Admitting: Internal Medicine

## 2023-03-29 ENCOUNTER — Observation Stay
Admission: EM | Admit: 2023-03-29 | Discharge: 2023-04-02 | Disposition: A | Discharge status: Skilled Nursing Facility | Payer: Medicare PPO | Attending: Student | Admitting: Student

## 2023-03-29 ENCOUNTER — Other Ambulatory Visit: Payer: Self-pay

## 2023-03-29 ENCOUNTER — Emergency Department: Payer: Medicare PPO

## 2023-03-29 DIAGNOSIS — I700 Atherosclerosis of aorta: Secondary | ICD-10-CM | POA: Diagnosis not present

## 2023-03-29 DIAGNOSIS — I609 Nontraumatic subarachnoid hemorrhage, unspecified: Principal | ICD-10-CM

## 2023-03-29 DIAGNOSIS — M25561 Pain in right knee: Secondary | ICD-10-CM

## 2023-03-29 DIAGNOSIS — Z6823 Body mass index (BMI) 23.0-23.9, adult: Secondary | ICD-10-CM | POA: Diagnosis not present

## 2023-03-29 DIAGNOSIS — N1832 Chronic kidney disease, stage 3b: Secondary | ICD-10-CM | POA: Diagnosis not present

## 2023-03-29 DIAGNOSIS — Z7901 Long term (current) use of anticoagulants: Secondary | ICD-10-CM | POA: Insufficient documentation

## 2023-03-29 DIAGNOSIS — G8929 Other chronic pain: Secondary | ICD-10-CM | POA: Diagnosis not present

## 2023-03-29 DIAGNOSIS — W19XXXA Unspecified fall, initial encounter: Secondary | ICD-10-CM | POA: Diagnosis not present

## 2023-03-29 DIAGNOSIS — Z79899 Other long term (current) drug therapy: Secondary | ICD-10-CM | POA: Insufficient documentation

## 2023-03-29 DIAGNOSIS — S066XAA Traumatic subarachnoid hemorrhage with loss of consciousness status unknown, initial encounter: Principal | ICD-10-CM | POA: Insufficient documentation

## 2023-03-29 DIAGNOSIS — I129 Hypertensive chronic kidney disease with stage 1 through stage 4 chronic kidney disease, or unspecified chronic kidney disease: Secondary | ICD-10-CM | POA: Diagnosis not present

## 2023-03-29 DIAGNOSIS — F32A Depression, unspecified: Secondary | ICD-10-CM | POA: Insufficient documentation

## 2023-03-29 DIAGNOSIS — S0990XA Unspecified injury of head, initial encounter: Secondary | ICD-10-CM | POA: Diagnosis present

## 2023-03-29 DIAGNOSIS — I2510 Atherosclerotic heart disease of native coronary artery without angina pectoris: Secondary | ICD-10-CM | POA: Insufficient documentation

## 2023-03-29 DIAGNOSIS — E785 Hyperlipidemia, unspecified: Secondary | ICD-10-CM | POA: Diagnosis not present

## 2023-03-29 DIAGNOSIS — J479 Bronchiectasis, uncomplicated: Secondary | ICD-10-CM | POA: Insufficient documentation

## 2023-03-29 DIAGNOSIS — Z9181 History of falling: Secondary | ICD-10-CM | POA: Diagnosis not present

## 2023-03-29 LAB — BASIC METABOLIC PANEL
Anion gap: 10 (ref 5–15)
BUN: 34 mg/dL — ABNORMAL HIGH (ref 8–23)
CO2: 25 mmol/L (ref 22–32)
Calcium: 9.4 mg/dL (ref 8.9–10.3)
Chloride: 103 mmol/L (ref 98–111)
Creatinine, Ser: 1.29 mg/dL — ABNORMAL HIGH (ref 0.44–1.00)
GFR, Estimated: 39 mL/min — ABNORMAL LOW (ref >60)
Glucose, Bld: 166 mg/dL — ABNORMAL HIGH (ref 70–99)
Potassium: 4.8 mmol/L (ref 3.5–5.1)
Sodium: 138 mmol/L (ref 135–145)

## 2023-03-29 LAB — GLUCOSE, CAPILLARY: Glucose-Capillary: 122 mg/dL — ABNORMAL HIGH (ref 70–99)

## 2023-03-29 LAB — URINALYSIS, ROUTINE W REFLEX MICROSCOPIC
Bacteria, UA: NONE SEEN
Bilirubin Urine: NEGATIVE
Glucose, UA: NEGATIVE mg/dL
Hgb urine dipstick: NEGATIVE
Ketones, ur: NEGATIVE mg/dL
Leukocytes,Ua: NEGATIVE
Nitrite: NEGATIVE
Protein, ur: 30 mg/dL — AB
Specific Gravity, Urine: 1.012 (ref 1.005–1.030)
pH: 7 (ref 5.0–8.0)

## 2023-03-29 LAB — CBC
HCT: 36.8 % (ref 36.0–46.0)
Hemoglobin: 12.1 g/dL (ref 12.0–15.0)
MCH: 31.3 pg (ref 26.0–34.0)
MCHC: 32.9 g/dL (ref 30.0–36.0)
MCV: 95.3 fL (ref 80.0–100.0)
Platelets: 201 10*3/uL (ref 150–400)
RBC: 3.86 MIL/uL — ABNORMAL LOW (ref 3.87–5.11)
RDW: 14.2 % (ref 11.5–15.5)
WBC: 8.9 10*3/uL (ref 4.0–10.5)
nRBC: 0 % (ref 0.0–0.2)

## 2023-03-29 LAB — CBG MONITORING, ED: Glucose-Capillary: 137 mg/dL — ABNORMAL HIGH (ref 70–99)

## 2023-03-29 MED ORDER — ALBUTEROL SULFATE (2.5 MG/3ML) 0.083% IN NEBU: 2.5000 mg | INHALATION_SOLUTION | RESPIRATORY_TRACT | Status: DC | PRN

## 2023-03-29 MED ORDER — IRBESARTAN 75 MG PO TABS: 37.5000 mg | ORAL_TABLET | Freq: Every day | ORAL | Status: DC

## 2023-03-29 MED ORDER — IRBESARTAN 75 MG PO TABS
37.5000 mg | ORAL_TABLET | Freq: Every day | ORAL | Status: DC
  Administered 2023-03-30 – 2023-04-02 (×4): 37.5 mg via ORAL
  Filled 2023-03-29 (×4): qty 0.5

## 2023-03-29 MED ORDER — ONDANSETRON HCL 4 MG PO TABS: 4.0000 mg | ORAL_TABLET | Freq: Four times a day (QID) | ORAL | Status: DC | PRN

## 2023-03-29 MED ORDER — ACETAMINOPHEN 650 MG RE SUPP: 650.0000 mg | Freq: Four times a day (QID) | RECTAL | Status: DC | PRN

## 2023-03-29 MED ORDER — GABAPENTIN 300 MG PO CAPS
300.0000 mg | ORAL_CAPSULE | Freq: Two times a day (BID) | ORAL | Status: DC
  Administered 2023-03-29 – 2023-04-02 (×8): 300 mg via ORAL
  Filled 2023-03-29 (×8): qty 1

## 2023-03-29 MED ORDER — ACETAMINOPHEN 500 MG PO TABS
1000.0000 mg | ORAL_TABLET | Freq: Once | ORAL | Status: AC
Start: 2023-03-29 — End: 2023-03-29
  Administered 2023-03-29: 1000 mg via ORAL
  Filled 2023-03-29: qty 2

## 2023-03-29 MED ORDER — TRIAMCINOLONE ACETONIDE 0.1 % EX CREA
TOPICAL_CREAM | Freq: Two times a day (BID) | CUTANEOUS | Status: DC
  Filled 2023-03-29 (×2): qty 15

## 2023-03-29 MED ORDER — OXYCODONE HCL 5 MG PO TABS
5.0000 mg | ORAL_TABLET | ORAL | Status: DC | PRN
  Administered 2023-04-01: 5 mg via ORAL
  Filled 2023-03-29: qty 1

## 2023-03-29 MED ORDER — ONDANSETRON HCL 4 MG/2ML IJ SOLN
4.0000 mg | Freq: Four times a day (QID) | INTRAMUSCULAR | Status: DC | PRN
  Administered 2023-03-31: 4 mg via INTRAVENOUS
  Filled 2023-03-29: qty 2

## 2023-03-29 MED ORDER — VITAMIN B-12 1000 MCG PO TABS
1000.0000 ug | ORAL_TABLET | Freq: Every day | ORAL | Status: DC
  Administered 2023-03-30 – 2023-04-02 (×4): 1000 ug via ORAL
  Filled 2023-03-29 (×4): qty 1

## 2023-03-29 MED ORDER — HYDRALAZINE HCL 50 MG PO TABS
25.0000 mg | ORAL_TABLET | Freq: Three times a day (TID) | ORAL | Status: DC
  Administered 2023-03-29 – 2023-03-30 (×4): 25 mg via ORAL
  Filled 2023-03-29 (×5): qty 1

## 2023-03-29 MED ORDER — METOPROLOL SUCCINATE ER 25 MG PO TB24
25.0000 mg | ORAL_TABLET | Freq: Every day | ORAL | Status: DC
  Administered 2023-03-29 – 2023-03-30 (×2): 25 mg via ORAL
  Filled 2023-03-29 (×2): qty 1

## 2023-03-29 MED ORDER — MORPHINE SULFATE (PF) 2 MG/ML IV SOLN: 2.0000 mg | INTRAVENOUS | Status: DC | PRN

## 2023-03-29 MED ORDER — SIMVASTATIN 20 MG PO TABS
20.0000 mg | ORAL_TABLET | Freq: Every evening | ORAL | Status: DC
  Administered 2023-03-29 – 2023-04-01 (×4): 20 mg via ORAL
  Filled 2023-03-29 (×4): qty 1

## 2023-03-29 MED ORDER — ANASTROZOLE 1 MG PO TABS
1.0000 mg | ORAL_TABLET | Freq: Every day | ORAL | Status: DC
  Administered 2023-03-30 – 2023-04-02 (×4): 1 mg via ORAL
  Filled 2023-03-29 (×5): qty 1

## 2023-03-29 MED ORDER — ACETAMINOPHEN 325 MG PO TABS
650.0000 mg | ORAL_TABLET | Freq: Four times a day (QID) | ORAL | Status: DC | PRN
  Administered 2023-03-29 – 2023-04-01 (×6): 650 mg via ORAL
  Filled 2023-03-29 (×6): qty 2

## 2023-03-29 MED ORDER — CITALOPRAM HYDROBROMIDE 20 MG PO TABS
10.0000 mg | ORAL_TABLET | Freq: Every day | ORAL | Status: DC
  Administered 2023-03-30 – 2023-04-02 (×4): 10 mg via ORAL
  Filled 2023-03-29 (×4): qty 1

## 2023-03-29 MED ORDER — SENNOSIDES-DOCUSATE SODIUM 8.6-50 MG PO TABS: 1.0000 | ORAL_TABLET | Freq: Every evening | ORAL | Status: DC | PRN

## 2023-03-29 MED ORDER — LABETALOL HCL 5 MG/ML IV SOLN
10.0000 mg | Freq: Once | INTRAVENOUS | Status: AC
  Administered 2023-03-29: 10 mg via INTRAVENOUS
  Filled 2023-03-29: qty 4

## 2023-03-29 NOTE — ED Provider Notes (Signed)
[facility] Provider Note    Event Date/Time   First MD Initiated Contact with Patient 03/29/23 [phone]     (approximate)   History   Fall and Knee Pain   HPI  Tina Curtis is a 89 year old female presenting to the emergency department for evaluation after a fall.  Patient reports that she was walking when she fell to the ground.  Does not think she lost consciousness, but is not sure what precipitated her fall.  Denies preceding chest pain, shortness of breath.  Is on Plavix.  Currently reports pain in her right knee, neck, head, upper back.     Physical Exam   Triage Vital Signs: ED Triage Vitals  Encounter Vitals Group     BP 03/29/23 0741 (!) 166/84     Systolic BP Percentile --      Diastolic BP Percentile --      Pulse Rate 03/29/23 0741 76     Resp 03/29/23 0741 17     Temp 03/29/23 0741 98.1 F (36.7 C)     Temp Source 03/29/23 0741 Oral     SpO2 03/29/23 0741 97 %     Weight 03/29/23 0742 130 lb (59 kg)     Height 03/29/23 0742 5\' 2"  (1.575 m)     Head Circumference --      Peak Flow --      Pain Score 03/29/23 0741 5     Pain Loc --      Pain Education --      Exclude from Growth Chart --     Most recent vital signs: Vitals:   03/29/23 1500 03/29/23 1530  BP: (!) 145/79 (!) 149/64  Pulse: 77 74  Resp: 17 14  Temp:    SpO2: 97% 95%    Nursing notes and vital signs reviewed.  General: Adult female, laying in bed, awake interactive Head: Tenderness over the head without obvious overlying trauma Chest: Symmetric chest rise, tenderness over ant chest wall Cardiac: Regular rhythm and rate.  Respiratory: Lungs clear to auscultation Abdomen: Soft, nondistended. No tenderness to palpation.  Pelvis: Stable in AP and lateral compression. No tenderness to palpation. MSK: No deformity to bilateral upper and lower extremity.  Swelling along the medial aspect of the knee with pain with palpation  Neuro: Alert, oriented. GCS 15.  Normal  sensation to light touch in bilateral upper and lower extremity. Back: Tenderness to palpation over the cervical and upper thoracic spine, remainder of spine nontender Skin: No evidence of burns or lacerations. ED Results / Procedures / Treatments   Labs (all labs ordered are listed, but only abnormal results are displayed) Labs Reviewed  BASIC METABOLIC PANEL - Abnormal; Notable for the following components:      Result Value   Glucose, Bld 166 (*)    BUN 34 (*)    Creatinine, Ser 1.29 (*)    GFR, Estimated 39 (*)    All other components within normal limits  CBC - Abnormal; Notable for the following components:   RBC 3.86 (*)    All other components within normal limits  CBG MONITORING, ED - Abnormal; Notable for the following components:   Glucose-Capillary 137 (*)    All other components within normal limits  URINALYSIS, ROUTINE W REFLEX MICROSCOPIC     EKG EKG independently reviewed interpreted by myself (ER attending) demonstrates:  EKG demonstrates normal sinus rhythm at rate of 82, PR 138, QR 74, QTc 436, nonspecific ST changes  RADIOLOGY Imaging independently reviewed and interpreted by myself demonstrates:  Initial CT head without obvious bleed, radiology notes trace subarachnoid hemorrhage, likely traumatic CT C-spine without acute fracture CT chest without rib fracture  CT T-spine without acute fracture Knee x-Margree Gimbel without acute fracture  PROCEDURES:  Critical Care performed: No  Procedures   MEDICATIONS ORDERED IN ED: Medications  labetalol (NORMODYNE) injection 10 mg (10 mg Intravenous Given 03/29/23 1159)  acetaminophen (TYLENOL) tablet 1,000 mg (1,000 mg Oral Given 03/29/23 1158)     IMPRESSION / MDM / ASSESSMENT AND PLAN / ED COURSE  I reviewed the triage vital signs and the nursing notes.  Differential diagnosis includes, but is not limited to,   Patient's presentation is most consistent with acute presentation with potential threat to life or  bodily function.  89 year old female presenting to the emergency department for evaluation after a fall.  Labs overall reassuring.  CT head from triage notable for trace subarachnoid hemorrhage.  Will obtain additional imaging based on patient's exam.  Clinical Course as of 03/29/23 1603  Mon Mar 29, 2023  1610 Case reviewed with Dr. Katrinka Blazing with neurosurgery. Recommends repeat head CT in 6 hours to ensure stability and holding patients aspirin and plavix for 1 week.  [NR]  1117 Remainder of CTs without additional traumatic findings.  Labs without critical derangement.  Continues to await repeat head CT. [NR]  1602 Signed out to oncoming physician pending repeat CT and disposition. [NR]    Clinical Course User Index [NR] Trinna Post, MD     FINAL CLINICAL IMPRESSION(S) / ED DIAGNOSES   Final diagnoses:  Subarachnoid hemorrhage (HCC)  Acute pain of right knee     Rx / DC Orders   ED Discharge Orders     None        Note:  This document was prepared using Dragon voice recognition software and may include unintentional dictation errors.   Trinna Post, MD 03/29/23 [phone]

## 2023-03-29 NOTE — ED Notes (Signed)
Pt to CT for repeat head CT.

## 2023-03-29 NOTE — ED Notes (Signed)
ED TO INPATIENT HANDOFF REPORT  ED Nurse Name and Phone #: Al Corpus, RN [phone]   S Name/Age/Gender Tina Curtis 89 y.o. female Room/Bed: ED10A/ED10A  Code Status   Code Status: Limited: Do not attempt resuscitation (DNR) -DNR-LIMITED -Do Not Intubate/DNI   Home/SNF/Other Home Patient oriented to: self, place, time, and situation Is this baseline? Yes   Triage Complete: Triage complete  Chief Complaint Fall [W19.XXXA]  Triage Note Pt presents to ED via [facility] with [facility]. Pt states she was walking through her house and fell, pt denies LOC but does not recall why she fell. Pt does take Plavix daily. Pt also endorse R knee pain, pt has minor swelling to this area. Pt is A&Ox4 at this time.    Allergies Allergies  Allergen Reactions   Penicillins Anaphylaxis   Drug Ingredient [Zinc]    Empagliflozin Rash    Level of Care/Admitting Diagnosis ED Disposition     ED Disposition  Admit   Condition  --   Comment  Hospital Area: [facility] [[id number]]  Level of Care: Med-Surg [16]  Covid Evaluation: Asymptomatic - no recent exposure (last 10 days) testing not required  Diagnosis: Fall [290176]  Admitting Physician: Tresa Moore [[id number]]  Attending Physician: Tresa Moore [[id number]]          B Medical/Surgery History Past Medical History:  Diagnosis Date   Arthritis    Gout   Chronic kidney disease    Diarrhea    Hypercholesteremia    Hypertension    Neuropathy    feet and lower legs   Seasonal allergies    Skin cancer    face   Past Surgical History:  Procedure Laterality Date   ABDOMINAL HYSTERECTOMY  2000   APPENDECTOMY     BREAST BIOPSY Right 09/19/2019   affirm bx of calcs UOQ, x marker, path pending   BREAST BIOPSY Right 09/19/2019   [place] bx of mass,heart marker, path pending   BREAST BIOPSY Right 09/19/2019   [place] bx of LN, coil marker, path pending   CATARACT EXTRACTION Left    CATARACT EXTRACTION W/PHACO  Right 10/24/2014   Procedure: CATARACT EXTRACTION PHACO AND INTRAOCULAR LENS PLACEMENT (IOC);  Surgeon: Lockie Mola, MD;  Location: [facility];  Service: Ophthalmology;  Laterality: Right;   ESOPHAGOGASTRODUODENOSCOPY N/A 03/07/2018   Procedure: ESOPHAGOGASTRODUODENOSCOPY (EGD);  Surgeon: Toney Reil, MD;  Location: [facility] END[facility];  Service: Gastroenterology;  Laterality: N/A;   MASTECTOMY MODIFIED RADICAL Right 10/13/2019   Procedure: MASTECTOMY MODIFIED RADICAL;  Surgeon: Earline Mayotte, MD;  Location: [facility] ORS;  Service: General;  Laterality: Right;   RENAL ANGIOGRAPHY Right 04/11/2018   Procedure: RENAL ANGIOGRAPHY;  Surgeon: Annice Needy, MD;  Location: [facility] INVASIVE CV LAB;  Service: Cardiovascular;  Laterality: Right;   SIMPLE MASTECTOMY WITH AXILLARY SENTINEL NODE BIOPSY Left 10/13/2019   Procedure: SIMPLE MASTECTOMY TRUE CUT BIOPSY, SENTINEL NODE BIOPSY;  Surgeon: Earline Mayotte, MD;  Location: [facility] ORS;  Service: General;  Laterality: Left;     A IV Location/Drains/Wounds Patient Lines/Drains/Airways Status     Active Line/Drains/Airways     Name Placement date Placement time Site Days   Peripheral IV 03/29/23 20 G 1" Left Antecubital 03/29/23  1010  Antecubital  less than 1            Intake/Output Last 24 hours No intake or output data in the 24 hours ending 03/29/23 1744  Labs/Imaging Results for orders placed or performed during  the hospital encounter of 03/29/23 (from the past 48 hour(s))  Basic metabolic panel     Status: Abnormal   Collection Time: 03/29/23  7:44 AM  Result Value Ref Range   Sodium 138 135 - 145 mmol/L   Potassium 4.8 3.5 - 5.1 mmol/L   Chloride 103 98 - 111 mmol/L   CO2 25 22 - 32 mmol/L   Glucose, Bld 166 (H) 70 - 99 mg/dL    Comment: Glucose reference range applies only to samples taken after fasting for at least 8 hours.   BUN 34 (H) 8 - 23 mg/dL   Creatinine, Ser 8.29 (H) 0.44 - 1.00 mg/dL   Calcium 9.4 8.9 -  56.2 mg/dL   GFR, Estimated 39 (L) >60 mL/min    Comment: (NOTE) Calculated using the CKD-EPI Creatinine Equation (2021)    Anion gap 10 5 - 15    Comment: Performed at [facility], [address] [place], [place] [address]  CBC     Status: Abnormal   Collection Time: 03/29/23  7:44 AM  Result Value Ref Range   WBC 8.9 4.0 - 10.5 K/uL   RBC 3.86 (L) 3.87 - 5.11 MIL/uL   Hemoglobin 12.1 12.0 - 15.0 g/dL   HCT 57.8 46.9 - 62.9 %   MCV 95.3 80.0 - 100.0 fL   MCH 31.3 26.0 - 34.0 pg   MCHC 32.9 30.0 - 36.0 g/dL   RDW 52.8 41.3 - 24.4 %   Platelets 201 150 - 400 K/uL   nRBC 0.0 0.0 - 0.2 %    Comment: Performed at [facility] [address] [place], [place] [address]  CBG monitoring, ED     Status: Abnormal   Collection Time: 03/29/23 10:41 AM  Result Value Ref Range   Glucose-Capillary 137 (H) 70 - 99 mg/dL    Comment: Glucose reference range applies only to samples taken after fasting for at least 8 hours.   CT Head Wo Contrast  Result Date: 03/29/2023 CLINICAL DATA:  Head trauma, intracranial arterial injury suspected f/u SAH EXAM: CT HEAD WITHOUT CONTRAST TECHNIQUE: Contiguous axial images were obtained from the base of the skull through the vertex without intravenous contrast. RADIATION DOSE REDUCTION: This exam was performed according to the departmental dose-optimization program which includes automated exposure control, adjustment of the mA and/or kV according to patient size and/or use of iterative reconstruction technique. COMPARISON:  Same day CT head FINDINGS: Brain: No hydrocephalus. No extra-axial fluid collection. No mastoid. No mass lesion. Unchanged possible trace subarachnoid hemorrhage in the left sylvian fissure. No CT evidence of acute cortical infarct. There is sequela mild chronic microvascular ischemic change. Vascular: No hyperdense vessel or unexpected calcification. Skull: Soft tissue swelling along the right lateral scalp. No evidence of  an calvarial fracture. Sinuses/Orbits: Trace right mastoid effusion. No middle ear effusion. Paranasal sinuses are clear. Bilateral lens replacement. Orbits are otherwise unremarkable. Other: None. IMPRESSION: 1. Unchanged possible trace subarachnoid hemorrhage in the left Sylvian fissure. 2. Soft tissue swelling along the right lateral scalp. No evidence of an calvarial fracture. Electronically Signed   By: Lorenza Cambridge M.D.   On: 03/29/2023 16:59   CT Chest Wo Contrast  Result Date: 03/29/2023 CLINICAL DATA:  Unwitnessed fall.  Chest trauma. EXAM: CT CHEST WITHOUT CONTRAST TECHNIQUE: Multidetector CT imaging of the chest was performed following the standard protocol without IV contrast. RADIATION DOSE REDUCTION: This exam was performed according to the departmental dose-optimization program which includes automated exposure control,  adjustment of the mA and/or kV according to patient size and/or use of iterative reconstruction technique. COMPARISON:  CTA chest dated 08/22/2022 FINDINGS: Cardiovascular: Normal heart size. No significant pericardial fluid/thickening. Great vessels are normal in course and caliber. Coronary artery calcifications and aortic atherosclerosis. Mediastinum/Nodes: Imaged thyroid gland without nodules meeting criteria for imaging follow-up by size. Normal esophagus. No pathologically enlarged axillary, supraclavicular, mediastinal, or hilar lymph nodes. Lungs/Pleura: The central airways are patent. Similar middle lobe, lingular, and bilateral lower lobe scarring and traction bronchiectasis. No focal consolidation. No pneumothorax. No pleural effusion. Upper abdomen: Multifocal pancreatic hypodensities, for example 2.4 cm hypoattenuating focus arising from the pancreatic neck (4:126), better evaluated on prior CT abdomen examinations. Atrophic left kidney. Musculoskeletal: No acute or abnormal lytic or blastic osseous lesions. Please see separately dictated CT thoracic spine report for  detailed findings. Multilevel degenerative changes of the thoracic spine. Right axillary surgical clips. IMPRESSION: 1. No acute or traumatic intrathoracic abnormality. 2. Similar middle lobe, lingular, and bilateral lower lobe scarring and traction bronchiectasis. 3. Multifocal pancreatic hypodensities, better evaluated on prior CT abdomen examinations. 4. Aortic Atherosclerosis (ICD10-I70.0). Coronary artery calcifications. Assessment for potential risk factor modification, dietary therapy or pharmacologic therapy may be warranted, if clinically indicated. Electronically Signed   By: Agustin Cree M.D.   On: 03/29/2023 10:10   CT Cervical Spine Wo Contrast  Result Date: 03/29/2023 CLINICAL DATA:  Neck trauma (Age >= 65y). Fall with neck and back pain. EXAM: CT CERVICAL SPINE WITHOUT CONTRAST CT THORACIC SPINE WITHOUT CONTRAST TECHNIQUE: Multidetector CT imaging of the cervical and thoracic spine was performed without contrast. Multiplanar CT image reconstructions were also generated. RADIATION DOSE REDUCTION: This exam was performed according to the departmental dose-optimization program which includes automated exposure control, adjustment of the mA and/or kV according to patient size and/or use of iterative reconstruction technique. COMPARISON:  CT cervical spine 06/05/2022. FINDINGS: CT CERVICAL SPINE FINDINGS Alignment: Unchanged trace retrolisthesis of C3 on C4 and 3 mm anterolisthesis of C7 on T1. No traumatic malalignment. Skull base and vertebrae: No acute fracture. Normal craniocervical junction. No suspicious bone lesions. Soft tissues and spinal canal: No prevertebral fluid or swelling. No visible canal hematoma. Disc levels: Multilevel cervical spondylosis, worst at C3-4, where there is at least mild spinal canal stenosis. Upper chest: No acute findings. Other: Atherosclerotic calcifications of the carotid bulbs. CT THORACIC SPINE FINDINGS Alignment: Exaggerated kyphosis of the upper thoracic spine.  Vertebrae: Diffuse idiopathic skeletal hyperostosis. No acute fracture or suspicious bone lesion. Paraspinal and other soft tissues: Please refer to same-day chest CT report. Disc levels: No disc herniation, spinal canal stenosis or neural foraminal narrowing. IMPRESSION: 1. No acute fracture or traumatic malalignment of the cervical or thoracic spine. 2. Multilevel cervical spondylosis, worst at C3-4, where there is at least mild spinal canal stenosis. Electronically Signed   By: Orvan Falconer M.D.   On: 03/29/2023 10:08   CT T-SPINE NO CHARGE  Result Date: 03/29/2023 CLINICAL DATA:  Neck trauma (Age >= 65y). Fall with neck and back pain. EXAM: CT CERVICAL SPINE WITHOUT CONTRAST CT THORACIC SPINE WITHOUT CONTRAST TECHNIQUE: Multidetector CT imaging of the cervical and thoracic spine was performed without contrast. Multiplanar CT image reconstructions were also generated. RADIATION DOSE REDUCTION: This exam was performed according to the departmental dose-optimization program which includes automated exposure control, adjustment of the mA and/or kV according to patient size and/or use of iterative reconstruction technique. COMPARISON:  CT cervical spine 06/05/2022. FINDINGS: CT CERVICAL SPINE FINDINGS Alignment: Unchanged  trace retrolisthesis of C3 on C4 and 3 mm anterolisthesis of C7 on T1. No traumatic malalignment. Skull base and vertebrae: No acute fracture. Normal craniocervical junction. No suspicious bone lesions. Soft tissues and spinal canal: No prevertebral fluid or swelling. No visible canal hematoma. Disc levels: Multilevel cervical spondylosis, worst at C3-4, where there is at least mild spinal canal stenosis. Upper chest: No acute findings. Other: Atherosclerotic calcifications of the carotid bulbs. CT THORACIC SPINE FINDINGS Alignment: Exaggerated kyphosis of the upper thoracic spine. Vertebrae: Diffuse idiopathic skeletal hyperostosis. No acute fracture or suspicious bone lesion. Paraspinal  and other soft tissues: Please refer to same-day chest CT report. Disc levels: No disc herniation, spinal canal stenosis or neural foraminal narrowing. IMPRESSION: 1. No acute fracture or traumatic malalignment of the cervical or thoracic spine. 2. Multilevel cervical spondylosis, worst at C3-4, where there is at least mild spinal canal stenosis. Electronically Signed   By: Orvan Falconer M.D.   On: 03/29/2023 10:08   DG Knee Complete 4 Views Right  Result Date: 03/29/2023 CLINICAL DATA:  Fall.  Right knee pain. EXAM: RIGHT KNEE - COMPLETE 4+ VIEW COMPARISON:  None Available. FINDINGS: There is diffuse osteopenia of the visualized osseous structures. No acute fracture or dislocation. No aggressive osseous lesion. There are degenerative changes of the knee joint in the form of mildly reduced medial tibio-femoral compartment joint space, tibial spiking and osteophytosis. Note is also made of meniscal chondrocalcinosis. No knee effusion or focal soft tissue swelling. No radiopaque foreign bodies. IMPRESSION: *No acute osseous abnormality of the right knee joint. *Mild degenerative osteoarthritis. Electronically Signed   By: Jules Schick M.D.   On: 03/29/2023 08:59   CT HEAD WO CONTRAST  Result Date: 03/29/2023 CLINICAL DATA:  Head trauma, minor EXAM: CT HEAD WITHOUT CONTRAST TECHNIQUE: Contiguous axial images were obtained from the base of the skull through the vertex without intravenous contrast. RADIATION DOSE REDUCTION: This exam was performed according to the departmental dose-optimization program which includes automated exposure control, adjustment of the mA and/or kV according to patient size and/or use of iterative reconstruction technique. COMPARISON:  08/22/2022 FINDINGS: Brain: Small high-density area at the lower left sylvian fissure, more globular than expected for hyperdense MCA, although contiguous with the MCA. No reported MCA syndrome. No evidence of acute infarct, hydrocephalus, mass, or  collection. Vascular: Atheromatous calcification Skull: Right parietal scalp swelling without underlying fracture. Sinuses/Orbits: No evidence of injury Critical Value/emergent results were called by telephone at the time of interpretation on 03/29/2023 at 8:24 am to provider Jene Every , who verbally acknowledged these results. IMPRESSION: Tiny subarachnoid hemorrhage at the lower left sylvian fissure, likely traumatic, see reformats. Right parietal scalp hematoma without calvarial fracture. Electronically Signed   By: Tiburcio Pea M.D.   On: 03/29/2023 08:30    Pending Labs Unresulted Labs (From admission, onward)     Start     Ordered   03/30/23 0500  Basic metabolic panel  Tomorrow morning,   R        03/29/23 1738   03/30/23 0500  CBC  Tomorrow morning,   R        03/29/23 1738   03/29/23 0744  Urinalysis, Routine w reflex microscopic -Urine, Clean Catch  Once,   URGENT       Question:  Specimen Source  Answer:  Urine, Clean Catch   03/29/23 0744            Vitals/Pain Today's Vitals   03/29/23 1655 03/29/23 1657 03/29/23  1700 03/29/23 1730  BP: (!) 149/79  (!) 169/78 (!) 191/81  Pulse: 73  73 85  Resp: 16  19 19   Temp:  97.9 F (36.6 C)    TempSrc:  Oral    SpO2: 97%  95% 96%  Weight:      Height:      PainSc:  3       Isolation Precautions No active isolations  Medications Medications  anastrozole (ARIMIDEX) tablet 1 mg (has no administration in time range)  metoprolol succinate (TOPROL-XL) 24 hr tablet 25 mg (has no administration in time range)  simvastatin (ZOCOR) tablet 20 mg (has no administration in time range)  hydrALAZINE (APRESOLINE) tablet 25 mg (has no administration in time range)  citalopram (CELEXA) tablet 10 mg (has no administration in time range)  cyanocobalamin (VITAMIN B12) tablet 1,000 mcg (has no administration in time range)  gabapentin (NEURONTIN) capsule 300 mg (has no administration in time range)  acetaminophen (TYLENOL) tablet 650  mg (has no administration in time range)    Or  acetaminophen (TYLENOL) suppository 650 mg (has no administration in time range)  oxyCODONE (Oxy IR/ROXICODONE) immediate release tablet 5 mg (has no administration in time range)  morphine (PF) 2 MG/ML injection 2 mg (has no administration in time range)  ondansetron (ZOFRAN) tablet 4 mg (has no administration in time range)    Or  ondansetron (ZOFRAN) injection 4 mg (has no administration in time range)  senna-docusate (Senokot-S) tablet 1 tablet (has no administration in time range)  albuterol (PROVENTIL) (2.5 MG/3ML) 0.083% nebulizer solution 2.5 mg (has no administration in time range)  labetalol (NORMODYNE) injection 10 mg (10 mg Intravenous Given 03/29/23 1159)  acetaminophen (TYLENOL) tablet 1,000 mg (1,000 mg Oral Given 03/29/23 1158)    Mobility Unable to walk per family, recent falls, fall risk      Focused Assessments Neuro Assessment Handoff:  Swallow screen pass? Yes  Cardiac Rhythm: Normal sinus rhythm NIH Stroke Scale  Dizziness Present: No Headache Present: Yes Interval: Initial Level of Consciousness (1a.)   : Alert, keenly responsive LOC Questions (1b. )   : Answers both questions correctly LOC Commands (1c. )   : Performs both tasks correctly Best Gaze (2. )  : Normal Visual (3. )  : No visual loss Facial Palsy (4. )    : Normal symmetrical movements Motor Arm, Left (5a. )   : No drift Motor Arm, Right (5b. ) : No drift Motor Leg, Left (6a. )  : No drift Motor Leg, Right (6b. ) : No drift Limb Ataxia (7. ): Absent Sensory (8. )  : Mild-to-moderate sensory loss, patient feels pinprick is less sharp or is dull on the affected side, or there is a loss of superficial pain with pinprick, but patient is aware of being touched (BLE/feet: verbalizes my neuropathy (numbness/tingling) is worse, L worse than R) Best Language (9. )  : No aphasia Dysarthria (10. ): Normal Extinction/Inattention (11.)   : No  Abnormality Complete NIHSS TOTAL: 1     Neuro Assessment: Within Defined Limits Neuro Checks:   Initial (03/29/23 1011)  Has TPA been given? No If patient is a Neuro Trauma and patient is going to OR before floor call report to 4N Charge nurse: [phone] or [phone]   R Recommendations: See Admitting Provider Note  Report given to:   Additional Notes: A&Ox4, NAD, calm, interactive, NIH=1, for sensation, she says her neuropathy is worse, MAEx4, mentions HA, "unable to walk" per family,  small/ trace L SAH

## 2023-03-29 NOTE — ED Notes (Signed)
Pt alert, NAD, calm, interactive, resps e/u, speaking in clear complete sentences. Family at [facility].

## 2023-03-29 NOTE — ED Provider Notes (Signed)
Patient received in signout from Dr. Rosalia Hammers pending follow-up repeat CT head.  The repeat is stable.  Patient remains hemodynamically stable and neuro intact.  Plan conservative management as per neurosurgery recommendations with holding aspirin and Plavix for 1 week.  Patient however having significant difficulty with ambulation secondary to right knee pain.  She lives in independent living and is not capable of discharge back to this facility at this time but her facility does have higher level of care which can be arranged tomorrow.  Social work already working on this.  Will consult hospitalist for admission.   Willy Eddy, MD 03/29/23 [phone]

## 2023-03-29 NOTE — TOC Initial Note (Signed)
Transition of Care [facility]) - Initial/Assessment Note    Patient Details  Name: Tina Curtis MRN: [id number] Date of Birth: 1926-06-18  [facility] CM/SW Contact:    Marquita Palms, LCSW Phone Number: 03/29/2023, 3:48 PM  Clinical Narrative:                  CSW received phone call phone [facility] concerning placement for patient once she leaves the hospital. She was concerned that she may need some time before she lives on her own.  CSW spoke with MD to send new FL2 for patient./       Patient Goals and CMS Choice            Expected Discharge Plan and Services                                              Prior Living Arrangements/Services                       Activities of Daily Living      Permission Sought/Granted                  Emotional Assessment              Admission diagnosis:  fall Patient Active Problem List   Diagnosis Date Noted   Bacteremia due to Streptococcus 08/31/2022   Sepsis due to Streptococcus species with acute hypoxic respiratory failure without septic shock (HCC) 08/27/2022   CHF NYHA class III, acute, diastolic (HCC) 08/27/2022   Hematemesis 08/26/2022   NSTEMI (non-ST elevated myocardial infarction) (HCC) 08/26/2022   Hypertensive urgency 08/26/2022   Hypokalemia 08/26/2022   Hypocalcemia 08/26/2022   Chronic diastolic CHF (congestive heart failure) (HCC) 08/26/2022   Acute blood loss anemia 08/26/2022   Hypomagnesemia 08/26/2022   Hypophosphatemia 08/26/2022   Type II diabetes mellitus with renal manifestations (HCC) 08/26/2022   Rash 08/26/2022   Chest pain 08/26/2022   Acute on chronic respiratory failure with hypoxia (HCC) 06/10/2022   Emphysema lung (HCC) 06/10/2022   Pneumonia due to infectious organism 06/07/2022   History of breast cancer 06/06/2022   Depression 06/06/2022   Orthostatic hypotension 06/06/2022   Dehydration 06/06/2022   Dizziness and  giddiness 06/02/2022   Osteoporosis of forearm 03/28/2020   Breast cancer (HCC) 10/13/2019   Carcinoma of upper-inner quadrant of right breast in female, estrogen receptor positive (HCC) 09/27/2019   Anemia in chronic kidney disease 02/01/2019   Atherosclerosis of renal artery (HCC) 02/01/2019   Benign hypertensive kidney disease with chronic kidney disease 02/01/2019   Hyposmolality and/or hyponatremia 02/01/2019   Proteinuria 02/01/2019   Secondary hyperparathyroidism of renal origin (HCC) 02/01/2019   Anemia of chronic renal failure, stage 3b (HCC) 04/01/2018   Renovascular hypertension 04/01/2018   Renal artery stenosis (HCC) 04/01/2018   Nausea & vomiting 03/05/2018   AKI (acute kidney injury) (HCC) 02/17/2018   Acquired hammer toe of right foot 08/14/2014   Bunion of right foot 08/14/2014   Gout 08/14/2014   History of Clostridium difficile colitis 08/14/2014   Pure hypercholesterolemia 08/14/2014   RLS (restless legs syndrome) 08/14/2014   Hallux rigidus of right foot 04/24/2014   Primary osteoarthritis of foot 09/01/2013   Fatigue 09/30/2012   Gluteus medius or minimus syndrome 08/23/2012   Trochanteric bursitis 08/12/2012  Benign essential hypertension 09/03/2011   Diverticulosis of colon 09/03/2011   Osteopenia 09/03/2011   Enthesopathy of ankle and tarsus 04/13/2011   Osteoarthritis of spine with radiculopathy, lumbosacral region 09/30/2010   Allergic rhinitis 10/31/1928   PCP:  [name], MD Pharmacy:   [id number] [place], [place] - [address] AT [facility] OF [address] [place] [address] Phone: [phone] Fax: [phone]     Social Determinants of Health (SDOH) Social History: SDOH Screenings   Food Insecurity: No Food Insecurity (11/17/2022)   Received from [facility], [facility]  Housing: Low Risk  (08/27/2022)  Transportation Needs: No Transportation  Needs (11/17/2022)   Received from [facility], [facility]  Utilities: Not At Risk (11/17/2022)   Received from [facility], [facility]  Financial Resource Strain: Low Risk  (11/17/2022)   Received from [facility], [facility]  Tobacco Use: Low Risk  (03/29/2023)   SDOH Interventions:     Readmission Risk Interventions    06/08/2022    3:30 PM  Readmission Risk Prevention Plan  Transportation Screening Complete  Palliative Care Screening Not Applicable  Medication Review (RN Care Manager) Complete

## 2023-03-29 NOTE — [place]2 (Signed)
[place] MEDICAID FL2 LEVEL OF CARE FORM     IDENTIFICATION  Patient Name: Tina Curtis Birthdate: 11-Oct-1926 Sex: female Admission Date (Current Location): 03/29/2023  [facility] and [place] Number:  Chiropodist and Address:  [facility], [address], [place], [place] [address]      Provider Number: [id number]  Attending Physician Name and Address:  Trinna Post, MD  Relative Name and Phone Number:  Damyra, Hirth (Daughter)  [phone] (Mobile)    Current Level of Care: Hospital Recommended Level of Care: Assisted Living Facility Prior Approval Number:    Date Approved/Denied:   PASRR Number: [id number]  Discharge Plan: Other (Comment) ALF   Current Diagnoses: Patient Active Problem List   Diagnosis Date Noted   Bacteremia due to Streptococcus 08/31/2022   Sepsis due to Streptococcus species with acute hypoxic respiratory failure without septic shock (HCC) 08/27/2022   CHF NYHA class III, acute, diastolic (HCC) 08/27/2022   Hematemesis 08/26/2022   NSTEMI (non-ST elevated myocardial infarction) (HCC) 08/26/2022   Hypertensive urgency 08/26/2022   Hypokalemia 08/26/2022   Hypocalcemia 08/26/2022   Chronic diastolic CHF (congestive heart failure) (HCC) 08/26/2022   Acute blood loss anemia 08/26/2022   Hypomagnesemia 08/26/2022   Hypophosphatemia 08/26/2022   Type II diabetes mellitus with renal manifestations (HCC) 08/26/2022   Rash 08/26/2022   Chest pain 08/26/2022   Acute on chronic respiratory failure with hypoxia (HCC) 06/10/2022   Emphysema lung (HCC) 06/10/2022   Pneumonia due to infectious organism 06/07/2022   History of breast cancer 06/06/2022   Depression 06/06/2022   Orthostatic hypotension 06/06/2022   Dehydration 06/06/2022   Dizziness and giddiness 06/02/2022   Osteoporosis of forearm 03/28/2020   Breast cancer (HCC) 10/13/2019   Carcinoma of upper-inner quadrant of right breast in female, estrogen receptor  positive (HCC) 09/27/2019   Anemia in chronic kidney disease 02/01/2019   Atherosclerosis of renal artery (HCC) 02/01/2019   Benign hypertensive kidney disease with chronic kidney disease 02/01/2019   Hyposmolality and/or hyponatremia 02/01/2019   Proteinuria 02/01/2019   Secondary hyperparathyroidism of renal origin (HCC) 02/01/2019   Anemia of chronic renal failure, stage 3b (HCC) 04/01/2018   Renovascular hypertension 04/01/2018   Renal artery stenosis (HCC) 04/01/2018   Nausea & vomiting 03/05/2018   AKI (acute kidney injury) (HCC) 02/17/2018   Acquired hammer toe of right foot 08/14/2014   Bunion of right foot 08/14/2014   Gout 08/14/2014   History of Clostridium difficile colitis 08/14/2014   Pure hypercholesterolemia 08/14/2014   RLS (restless legs syndrome) 08/14/2014   Hallux rigidus of right foot 04/24/2014   Primary osteoarthritis of foot 09/01/2013   Fatigue 09/30/2012   Gluteus medius or minimus syndrome 08/23/2012   Trochanteric bursitis 08/12/2012   Benign essential hypertension 09/03/2011   Diverticulosis of colon 09/03/2011   Osteopenia 09/03/2011   Enthesopathy of ankle and tarsus 04/13/2011   Osteoarthritis of spine with radiculopathy, lumbosacral region 09/30/2010   Allergic rhinitis 10/31/1926    Orientation RESPIRATION BLADDER Height & Weight     Self, Time, Situation, Place    Incontinent Weight: 130 lb (59 kg) Height:  5\' 2"  (157.5 cm)  BEHAVIORAL SYMPTOMS/MOOD NEUROLOGICAL BOWEL NUTRITION STATUS      Incontinent Diet  AMBULATORY STATUS COMMUNICATION OF NEEDS Skin   Limited Assist Verbally Normal                       Personal Care Assistance Level of Assistance  Bathing, Feeding, Dressing Bathing  Assistance: Limited assistance Feeding assistance: Limited assistance Dressing Assistance: Limited assistance     Functional Limitations Info             SPECIAL CARE FACTORS FREQUENCY                       Contractures       Additional Factors Info  Code Status, Allergies Code Status Info: DNR Allergies Info: Penicillins, Drug Ingredient (zinc), Empagliflozin           Current Medications (03/29/2023):  This is the current hospital active medication list No current facility-administered medications for this encounter.   Current Outpatient Medications  Medication Sig Dispense Refill   anastrozole (ARIMIDEX) 1 MG tablet Take 1 tablet (1 mg total) by mouth daily. 90 tablet 1   aspirin 81 MG EC tablet Take 81 mg by mouth daily.     calcitRIOL (ROCALTROL) 0.25 MCG capsule Take 0.25 mcg by mouth every other day. Monday,Wednesday, Friday     calcium carbonate (OS-CAL - DOSED IN MG OF ELEMENTAL CALCIUM) 1250 (500 Ca) MG tablet Take 2 tablets by mouth 4 (four) times daily.     citalopram (CELEXA) 10 MG tablet Take 10 mg by mouth daily.      clopidogrel (PLAVIX) 75 MG tablet TAKE 1 TABLET BY MOUTH EVERY DAY 30 tablet 1   cyanocobalamin (VITAMIN B12) 1000 MCG tablet Take 1,000 mcg by mouth daily.     docusate sodium (COLACE) 100 MG capsule Take 100 mg by mouth daily as needed for mild constipation.     DULoxetine (CYMBALTA) 60 MG capsule Take 100 mg by mouth daily.     gabapentin (NEURONTIN) 300 MG capsule Take 300 mg by mouth 2 (two) times daily.     hydrALAZINE (APRESOLINE) 25 MG tablet Take 25 mg by mouth 3 (three) times daily.     loratadine (CLARITIN) 10 MG tablet Take 10 mg by mouth daily as needed for allergies. AM     metoprolol succinate (TOPROL-XL) 25 MG 24 hr tablet Take 25 mg by mouth at bedtime.     pantoprazole (PROTONIX) 40 MG tablet Take 1 tablet (40 mg total) by mouth daily. (Patient not taking: Reported on 02/22/2023)     simvastatin (ZOCOR) 20 MG tablet Take 20 mg by mouth every evening. PM     triamcinolone cream (KENALOG) 0.1 % Apply 1 Application topically 2 (two) times daily.     valsartan (DIOVAN) 40 MG tablet Take 40 mg by mouth daily.     Vitamin D, Ergocalciferol, (DRISDOL) 1.25 MG  (50000 UNIT) CAPS capsule Take 50,000 Units by mouth every 7 (seven) days.       Discharge Medications: Please see discharge summary for a list of discharge medications.  Relevant Imaging Results:  Relevant Lab Results:   Additional Information [id number]  Marquita Palms, LCSW

## 2023-03-29 NOTE — H&P (Signed)
History and Physical    Tina Curtis:[id number] [name]: 1927/02/01 [name]: 03/29/2023  PCP: [name], MD (Confirm with patient/family/NH records and if not entered, this has to be entered at [facility] point of entry) Patient coming from: Independent living  I have personally briefly reviewed patient's old medical records in [facility]  Chief Complaint: Fall  HPI: Tina Curtis is a 89 y.o. female with medical history significant of hypertension, chronic headache, CVA on dual antiplatelet therapy, neuropathic pain, hyperlipidemia who presents from her independent living facility [facility] after mechanical fall.  Patient states she was walking through her house and fell.  Denies loss of consciousness.  Does not recall why she fell.  On initial evaluation patient was found to have a trace subarachnoid hemorrhage.  EDP contacted neurosurgery who recommended repeat head CT within 6 hours.  This was completed and small bleed was stable.  Neurosurgery recommends conservative management and holding dual antiplatelet therapy x 1 week.  Patient will require skilled nursing facility placement so [facility] was contacted for admission.   ED Course: Initial vital signs stable.  Patient mentating clearly at baseline.  Head CT x 2 ordered from emergency room.  Neurosurgery contacted with recommendations for conservative management.  Hospitalist contacted for admission.  Review of Systems: As per HPI otherwise 14 point review of systems negative.    Past Medical History:  Diagnosis Date   Arthritis    Gout   Chronic kidney disease    Diarrhea    Hypercholesteremia    Hypertension    Neuropathy    feet and lower legs   Seasonal allergies    Skin cancer    face    Past Surgical History:  Procedure Laterality Date   ABDOMINAL HYSTERECTOMY  2000   APPENDECTOMY     BREAST BIOPSY Right 09/19/2019   affirm bx of calcs UOQ, x marker, path pending   BREAST BIOPSY Right 09/19/2019   [place] bx of  mass,heart marker, path pending   BREAST BIOPSY Right 09/19/2019   [place] bx of LN, coil marker, path pending   CATARACT EXTRACTION Left    CATARACT EXTRACTION W/PHACO Right 10/24/2014   Procedure: CATARACT EXTRACTION PHACO AND INTRAOCULAR LENS PLACEMENT (IOC);  Surgeon: Lockie Mola, MD;  Location: [facility];  Service: Ophthalmology;  Laterality: Right;   ESOPHAGOGASTRODUODENOSCOPY N/A 03/07/2018   Procedure: ESOPHAGOGASTRODUODENOSCOPY (EGD);  Surgeon: Toney Reil, MD;  Location: [facility];  Service: Gastroenterology;  Laterality: N/A;   MASTECTOMY MODIFIED RADICAL Right 10/13/2019   Procedure: MASTECTOMY MODIFIED RADICAL;  Surgeon: Earline Mayotte, MD;  Location: [facility] ORS;  Service: General;  Laterality: Right;   RENAL ANGIOGRAPHY Right 04/11/2018   Procedure: RENAL ANGIOGRAPHY;  Surgeon: Annice Needy, MD;  Location: [facility] INVASIVE CV LAB;  Service: Cardiovascular;  Laterality: Right;   SIMPLE MASTECTOMY WITH AXILLARY SENTINEL NODE BIOPSY Left 10/13/2019   Procedure: SIMPLE MASTECTOMY TRUE CUT BIOPSY, SENTINEL NODE BIOPSY;  Surgeon: Earline Mayotte, MD;  Location: [facility] ORS;  Service: General;  Laterality: Left;     reports that she has never smoked. She has never used smokeless tobacco. She reports that she does not drink alcohol and does not use drugs.  Allergies  Allergen Reactions   Penicillins Anaphylaxis   Drug Ingredient [Zinc]    Empagliflozin Rash    Family History  Problem Relation Age of Onset   Hypertension Mother    Hypertension Father    Stroke Father    Breast cancer Daughter  54     Prior to Admission medications   Medication Sig Start Date End Date Taking? Authorizing Provider  anastrozole (ARIMIDEX) 1 MG tablet Take 1 tablet (1 mg total) by mouth daily. 12/24/22   Earna Coder, MD  aspirin 81 MG EC tablet Take 81 mg by mouth daily. 10/24/20   [provider]  calcitRIOL (ROCALTROL) 0.25 MCG capsule Take 0.25 mcg by mouth  every other day. Monday,Wednesday, Friday    [provider]  calcium carbonate (OS-CAL - DOSED IN MG OF ELEMENTAL CALCIUM) 1250 (500 Ca) MG tablet Take 2 tablets by mouth 4 (four) times daily.    [provider]  citalopram (CELEXA) 10 MG tablet Take 10 mg by mouth daily.  03/14/18   [provider]  clopidogrel (PLAVIX) 75 MG tablet TAKE 1 TABLET BY MOUTH EVERY DAY 05/16/19   Annice Needy, MD  cyanocobalamin (VITAMIN B12) 1000 MCG tablet Take 1,000 mcg by mouth daily.    [provider]  docusate sodium (COLACE) 100 MG capsule Take 100 mg by mouth daily as needed for mild constipation.    [provider]  DULoxetine (CYMBALTA) 60 MG capsule Take 100 mg by mouth daily. 11/11/18   [provider]  gabapentin (NEURONTIN) 300 MG capsule Take 300 mg by mouth 2 (two) times daily.    [provider]  hydrALAZINE (APRESOLINE) 25 MG tablet Take 25 mg by mouth 3 (three) times daily. 04/09/22 04/29/23  [provider]  loratadine (CLARITIN) 10 MG tablet Take 10 mg by mouth daily as needed for allergies. AM    [provider]  metoprolol succinate (TOPROL-XL) 25 MG 24 hr tablet Take 25 mg by mouth at bedtime.    [provider]  pantoprazole (PROTONIX) 40 MG tablet Take 1 tablet (40 mg total) by mouth daily. Patient not taking: Reported on 02/22/2023 09/02/22   Miguel Rota, MD  simvastatin (ZOCOR) 20 MG tablet Take 20 mg by mouth every evening. PM    [provider]  triamcinolone cream (KENALOG) 0.1 % Apply 1 Application topically 2 (two) times daily. 06/20/22 06/20/23  [provider]  valsartan (DIOVAN) 40 MG tablet Take 40 mg by mouth daily.    [provider]  Vitamin D, Ergocalciferol, (DRISDOL) 1.25 MG (50000 UNIT) CAPS capsule Take 50,000 Units by mouth every 7 (seven) days.    [provider]    Physical Exam: Vitals:   03/29/23 1615 03/29/23 1630 03/29/23 1655 03/29/23 1657   BP:   (!) 149/79   Pulse: 78 73 73   Resp: 16 19 16    Temp:    97.9 F (36.6 C)  TempSrc:    Oral  SpO2: 99% 95% 97%   Weight:      Height:        Vitals:   03/29/23 1615 03/29/23 1630 03/29/23 1655 03/29/23 1657  BP:   (!) 149/79   Pulse: 78 73 73   Resp: 16 19 16    Temp:    97.9 F (36.6 C)  TempSrc:    Oral  SpO2: 99% 95% 97%   Weight:      Height:       General: No apparent distress, patient appears well HEENT: Normocephalic, atraumatic Neck, supple, trachea midline, no tenderness Heart: Regular rate and rhythm, S1/S2 normal, no murmurs Lungs: Lungs clear.  Normal work of breathing.  Room air Abdomen: Soft, nontender, nondistended, positive bowel sounds Extremities: Normal, atraumatic, no clubbing or cyanosis,  normal muscle tone Skin: No rashes or lesions, normal color Neurologic: Cranial nerves grossly intact, sensation intact, alert and oriented x2 Psychiatric: Normal affect   Labs on Admission: I have personally reviewed following labs and imaging studies  CBC: Recent Labs  Lab 03/29/23 0744  WBC 8.9  HGB 12.1  HCT 36.8  MCV 95.3  PLT 201   Basic Metabolic Panel: Recent Labs  Lab 03/29/23 0744  NA 138  K 4.8  CL 103  CO2 25  GLUCOSE 166*  BUN 34*  CREATININE 1.29*  CALCIUM 9.4   GFR: Estimated Creatinine Clearance: 21.5 mL/min (A) (by C-G formula based on SCr of 1.29 mg/dL (H)). Liver Function Tests: No results for input(s): "AST", "ALT", "ALKPHOS", "BILITOT", "PROT", "ALBUMIN" in the last 168 hours. No results for input(s): "LIPASE", "AMYLASE" in the last 168 hours. No results for input(s): "AMMONIA" in the last 168 hours. Coagulation Profile: No results for input(s): "INR", "PROTIME" in the last 168 hours. Cardiac Enzymes: No results for input(s): "CKTOTAL", "CKMB", "CKMBINDEX", "TROPONINI" in the last 168 hours. BNP (last 3 results) No results for input(s): "PROBNP" in the last 8760 hours. HbA1C: No results for input(s): "HGBA1C" in  the last 72 hours. CBG: Recent Labs  Lab 03/29/23 1041  GLUCAP 137*   Lipid Profile: No results for input(s): "CHOL", "HDL", "LDLCALC", "TRIG", "CHOLHDL", "LDLDIRECT" in the last 72 hours. Thyroid Function Tests: No results for input(s): "TSH", "T4TOTAL", "FREET4", "T3FREE", "THYROIDAB" in the last 72 hours. Anemia Panel: No results for input(s): "VITAMINB12", "FOLATE", "FERRITIN", "TIBC", "IRON", "RETICCTPCT" in the last 72 hours. Urine analysis:    Component Value Date/Time   COLORURINE STRAW (A) 08/22/2022 1318   APPEARANCEUR CLEAR (A) 08/22/2022 1318   LABSPEC 1.028 08/22/2022 1318   PHURINE 7.0 08/22/2022 1318   GLUCOSEU >=500 (A) 08/22/2022 1318   HGBUR NEGATIVE 08/22/2022 1318   BILIRUBINUR NEGATIVE 08/22/2022 1318   BILIRUBINUR negative 03/18/2022 1614   KETONESUR NEGATIVE 08/22/2022 1318   PROTEINUR 100 (A) 08/22/2022 1318   UROBILINOGEN 0.2 03/18/2022 1614   NITRITE NEGATIVE 08/22/2022 1318   LEUKOCYTESUR NEGATIVE 08/22/2022 1318    Radiological Exams on Admission: CT Head Wo Contrast  Result Date: 03/29/2023 CLINICAL DATA:  Head trauma, intracranial arterial injury suspected f/u [facility] EXAM: CT HEAD WITHOUT CONTRAST TECHNIQUE: Contiguous axial images were obtained from the base of the skull through the vertex without intravenous contrast. RADIATION DOSE REDUCTION: This exam was performed according to the departmental dose-optimization program which includes automated exposure control, adjustment of the mA and/or kV according to patient size and/or use of iterative reconstruction technique. COMPARISON:  Same day CT head FINDINGS: Brain: No hydrocephalus. No extra-axial fluid collection. No mastoid. No mass lesion. Unchanged possible trace subarachnoid hemorrhage in the left sylvian fissure. No CT evidence of acute cortical infarct. There is sequela mild chronic microvascular ischemic change. Vascular: No hyperdense vessel or unexpected calcification. Skull: Soft tissue  swelling along the right lateral scalp. No evidence of an calvarial fracture. Sinuses/Orbits: Trace right mastoid effusion. No middle ear effusion. Paranasal sinuses are clear. Bilateral lens replacement. Orbits are otherwise unremarkable. Other: None. IMPRESSION: 1. Unchanged possible trace subarachnoid hemorrhage in the left Sylvian fissure. 2. Soft tissue swelling along the right lateral scalp. No evidence of an calvarial fracture. Electronically Signed   By: Lorenza Cambridge M.D.   On: 03/29/2023 16:59   CT Chest Wo Contrast  Result Date: 03/29/2023 CLINICAL DATA:  Unwitnessed fall.  Chest trauma. EXAM: CT CHEST WITHOUT CONTRAST TECHNIQUE: Multidetector CT  imaging of the chest was performed following the standard protocol without IV contrast. RADIATION DOSE REDUCTION: This exam was performed according to the departmental dose-optimization program which includes automated exposure control, adjustment of the mA and/or kV according to patient size and/or use of iterative reconstruction technique. COMPARISON:  CTA chest dated 08/22/2022 FINDINGS: Cardiovascular: Normal heart size. No significant pericardial fluid/thickening. Great vessels are normal in course and caliber. Coronary artery calcifications and aortic atherosclerosis. Mediastinum/Nodes: Imaged thyroid gland without nodules meeting criteria for imaging follow-up by size. Normal esophagus. No pathologically enlarged axillary, supraclavicular, mediastinal, or hilar lymph nodes. Lungs/Pleura: The central airways are patent. Similar middle lobe, lingular, and bilateral lower lobe scarring and traction bronchiectasis. No focal consolidation. No pneumothorax. No pleural effusion. Upper abdomen: Multifocal pancreatic hypodensities, for example 2.4 cm hypoattenuating focus arising from the pancreatic neck (4:126), better evaluated on prior CT abdomen examinations. Atrophic left kidney. Musculoskeletal: No acute or abnormal lytic or blastic osseous lesions.  Please see separately dictated CT thoracic spine report for detailed findings. Multilevel degenerative changes of the thoracic spine. Right axillary surgical clips. IMPRESSION: 1. No acute or traumatic intrathoracic abnormality. 2. Similar middle lobe, lingular, and bilateral lower lobe scarring and traction bronchiectasis. 3. Multifocal pancreatic hypodensities, better evaluated on prior CT abdomen examinations. 4. Aortic Atherosclerosis (ICD10-I70.0). Coronary artery calcifications. Assessment for potential risk factor modification, dietary therapy or pharmacologic therapy may be warranted, if clinically indicated. Electronically Signed   By: Agustin Cree M.D.   On: 03/29/2023 10:10   CT Cervical Spine Wo Contrast  Result Date: 03/29/2023 CLINICAL DATA:  Neck trauma (Age >= 65y). Fall with neck and back pain. EXAM: CT CERVICAL SPINE WITHOUT CONTRAST CT THORACIC SPINE WITHOUT CONTRAST TECHNIQUE: Multidetector CT imaging of the cervical and thoracic spine was performed without contrast. Multiplanar CT image reconstructions were also generated. RADIATION DOSE REDUCTION: This exam was performed according to the departmental dose-optimization program which includes automated exposure control, adjustment of the mA and/or kV according to patient size and/or use of iterative reconstruction technique. COMPARISON:  CT cervical spine 06/05/2022. FINDINGS: CT CERVICAL SPINE FINDINGS Alignment: Unchanged trace retrolisthesis of C3 on C4 and 3 mm anterolisthesis of C7 on T1. No traumatic malalignment. Skull base and vertebrae: No acute fracture. Normal craniocervical junction. No suspicious bone lesions. Soft tissues and spinal canal: No prevertebral fluid or swelling. No visible canal hematoma. Disc levels: Multilevel cervical spondylosis, worst at C3-4, where there is at least mild spinal canal stenosis. Upper chest: No acute findings. Other: Atherosclerotic calcifications of the carotid bulbs. CT THORACIC SPINE FINDINGS  Alignment: Exaggerated kyphosis of the upper thoracic spine. Vertebrae: Diffuse idiopathic skeletal hyperostosis. No acute fracture or suspicious bone lesion. Paraspinal and other soft tissues: Please refer to same-day chest CT report. Disc levels: No disc herniation, spinal canal stenosis or neural foraminal narrowing. IMPRESSION: 1. No acute fracture or traumatic malalignment of the cervical or thoracic spine. 2. Multilevel cervical spondylosis, worst at C3-4, where there is at least mild spinal canal stenosis. Electronically Signed   By: Orvan Falconer M.D.   On: 03/29/2023 10:08   CT T-SPINE NO CHARGE  Result Date: 03/29/2023 CLINICAL DATA:  Neck trauma (Age >= 65y). Fall with neck and back pain. EXAM: CT CERVICAL SPINE WITHOUT CONTRAST CT THORACIC SPINE WITHOUT CONTRAST TECHNIQUE: Multidetector CT imaging of the cervical and thoracic spine was performed without contrast. Multiplanar CT image reconstructions were also generated. RADIATION DOSE REDUCTION: This exam was performed according to the departmental dose-optimization program which includes automated  exposure control, adjustment of the mA and/or kV according to patient size and/or use of iterative reconstruction technique. COMPARISON:  CT cervical spine 06/05/2022. FINDINGS: CT CERVICAL SPINE FINDINGS Alignment: Unchanged trace retrolisthesis of C3 on C4 and 3 mm anterolisthesis of C7 on T1. No traumatic malalignment. Skull base and vertebrae: No acute fracture. Normal craniocervical junction. No suspicious bone lesions. Soft tissues and spinal canal: No prevertebral fluid or swelling. No visible canal hematoma. Disc levels: Multilevel cervical spondylosis, worst at C3-4, where there is at least mild spinal canal stenosis. Upper chest: No acute findings. Other: Atherosclerotic calcifications of the carotid bulbs. CT THORACIC SPINE FINDINGS Alignment: Exaggerated kyphosis of the upper thoracic spine. Vertebrae: Diffuse idiopathic skeletal  hyperostosis. No acute fracture or suspicious bone lesion. Paraspinal and other soft tissues: Please refer to same-day chest CT report. Disc levels: No disc herniation, spinal canal stenosis or neural foraminal narrowing. IMPRESSION: 1. No acute fracture or traumatic malalignment of the cervical or thoracic spine. 2. Multilevel cervical spondylosis, worst at C3-4, where there is at least mild spinal canal stenosis. Electronically Signed   By: Orvan Falconer M.D.   On: 03/29/2023 10:08   DG Knee Complete 4 Views Right  Result Date: 03/29/2023 CLINICAL DATA:  Fall.  Right knee pain. EXAM: RIGHT KNEE - COMPLETE 4+ VIEW COMPARISON:  None Available. FINDINGS: There is diffuse osteopenia of the visualized osseous structures. No acute fracture or dislocation. No aggressive osseous lesion. There are degenerative changes of the knee joint in the form of mildly reduced medial tibio-femoral compartment joint space, tibial spiking and osteophytosis. Note is also made of meniscal chondrocalcinosis. No knee effusion or focal soft tissue swelling. No radiopaque foreign bodies. IMPRESSION: *No acute osseous abnormality of the right knee joint. *Mild degenerative osteoarthritis. Electronically Signed   By: Jules Schick M.D.   On: 03/29/2023 08:59   CT HEAD WO CONTRAST  Result Date: 03/29/2023 CLINICAL DATA:  Head trauma, minor EXAM: CT HEAD WITHOUT CONTRAST TECHNIQUE: Contiguous axial images were obtained from the base of the skull through the vertex without intravenous contrast. RADIATION DOSE REDUCTION: This exam was performed according to the departmental dose-optimization program which includes automated exposure control, adjustment of the mA and/or kV according to patient size and/or use of iterative reconstruction technique. COMPARISON:  08/22/2022 FINDINGS: Brain: Small high-density area at the lower left sylvian fissure, more globular than expected for hyperdense MCA, although contiguous with the MCA. No  reported MCA syndrome. No evidence of acute infarct, hydrocephalus, mass, or collection. Vascular: Atheromatous calcification Skull: Right parietal scalp swelling without underlying fracture. Sinuses/Orbits: No evidence of injury Critical Value/emergent results were called by telephone at the time of interpretation on 03/29/2023 at 8:24 am to provider Jene Every , who verbally acknowledged these results. IMPRESSION: Tiny subarachnoid hemorrhage at the lower left sylvian fissure, likely traumatic, see reformats. Right parietal scalp hematoma without calvarial fracture. Electronically Signed   By: Tiburcio Pea M.D.   On: 03/29/2023 08:30    EKG: Independently reviewed.  Sinus rhythm  Assessment/Plan Principal Problem:   Fall  Mechanical fall Trace subarachnoid hemorrhage Patient is alert and oriented x 4.  Head CT x 2 with 6 hours spacing is reassuring.  No expansion of bleed.  Neurosurgery contacted from ED.  Recommend conservative management. Plan: Place in observation Frequent neurochecks Hold aspirin and Plavix x 1 week PT OT consults [facility] consult for placement Fall precautions  Essential hypertension Restart home regimen Toprol-XL 25 mg daily Avapro 37.5 mg daily Hydralazine 25  mg 3 times daily  Hyperlipidemia PTA statin  Depression PTA Celexa  Chronic arthritic pain Neuropathic pain Restart home gabapentin Cautious narcotic regimen  CKD stage IIIb Creatinine at or near baseline Avoid nonessential nephrotoxins Outpatient nephrology follow-up  DVT prophylaxis: TED hose Code Status: DNR Family Communication: Close friend at bedside Disposition Plan: Anticipate need for skilled nursing facility Consults called: None Admission status: Observation, MedSurg   Tresa Moore MD [facility]   If 7PM-7AM, please contact night-coverage   03/29/2023, 5:38 PM

## 2023-03-29 NOTE — ED Triage Notes (Signed)
Pt presents to ED via [facility] with [facility]. Pt states she was walking through her house and fell, pt denies LOC but does not recall why she fell. Pt does take Plavix daily. Pt also endorse R knee pain, pt has minor swelling to this area. Pt is A&Ox4 at this time.

## 2023-03-30 DIAGNOSIS — I609 Nontraumatic subarachnoid hemorrhage, unspecified: Secondary | ICD-10-CM | POA: Diagnosis not present

## 2023-03-30 DIAGNOSIS — W19XXXA Unspecified fall, initial encounter: Secondary | ICD-10-CM | POA: Diagnosis not present

## 2023-03-30 LAB — CBC
HCT: 32.5 % — ABNORMAL LOW (ref 36.0–46.0)
Hemoglobin: 11.1 g/dL — ABNORMAL LOW (ref 12.0–15.0)
MCH: 31.3 pg (ref 26.0–34.0)
MCHC: 34.2 g/dL (ref 30.0–36.0)
MCV: 91.5 fL (ref 80.0–100.0)
Platelets: 183 10*3/uL (ref 150–400)
RBC: 3.55 MIL/uL — ABNORMAL LOW (ref 3.87–5.11)
RDW: 13.8 % (ref 11.5–15.5)
WBC: 7 10*3/uL (ref 4.0–10.5)
nRBC: 0 % (ref 0.0–0.2)

## 2023-03-30 LAB — BASIC METABOLIC PANEL
Anion gap: 11 (ref 5–15)
BUN: 31 mg/dL — ABNORMAL HIGH (ref 8–23)
CO2: 21 mmol/L — ABNORMAL LOW (ref 22–32)
Calcium: 8.8 mg/dL — ABNORMAL LOW (ref 8.9–10.3)
Chloride: 106 mmol/L (ref 98–111)
Creatinine, Ser: 1.29 mg/dL — ABNORMAL HIGH (ref 0.44–1.00)
GFR, Estimated: 39 mL/min — ABNORMAL LOW (ref >60)
Glucose, Bld: 134 mg/dL — ABNORMAL HIGH (ref 70–99)
Potassium: 3.8 mmol/L (ref 3.5–5.1)
Sodium: 138 mmol/L (ref 135–145)

## 2023-03-30 MED ORDER — MELATONIN 5 MG PO TABS
5.0000 mg | ORAL_TABLET | Freq: Every evening | ORAL | Status: DC | PRN
  Administered 2023-03-30 – 2023-04-01 (×3): 5 mg via ORAL
  Filled 2023-03-30 (×3): qty 1

## 2023-03-30 MED ORDER — DICLOFENAC SODIUM 1 % EX GEL
4.0000 g | Freq: Four times a day (QID) | CUTANEOUS | Status: DC
  Administered 2023-03-30 – 2023-04-02 (×12): 4 g via TOPICAL
  Filled 2023-03-30: qty 100

## 2023-03-30 NOTE — Progress Notes (Addendum)
PROGRESS NOTE    Tina Curtis  EXB:[id number] [name]: 1926/06/07 [name]: 03/29/2023 PCP: [name], MD    Brief Narrative:   89 y.o. female with medical history significant of hypertension, chronic headache, CVA on dual antiplatelet therapy, neuropathic pain, hyperlipidemia who presents from her independent living facility [facility] after mechanical fall.  Patient states she was walking through her house and fell.  Denies loss of consciousness.  Does not recall why she fell.   On initial evaluation patient was found to have a trace subarachnoid hemorrhage.  EDP contacted neurosurgery who recommended repeat head CT within 6 hours.  This was completed and small bleed was stable.  Neurosurgery recommends conservative management and holding dual antiplatelet therapy x 1 week.   Patient will require skilled nursing facility placement so [facility] was contacted for admission.  Patient seen in consultation by PT and OT.  Recommendation for short-term rehab.  TOC aware and will reach out to [facility]  Assessment & Plan:   Principal Problem:   Fall Mechanical fall Trace subarachnoid hemorrhage Patient is alert and oriented x 4.  Head CT x 2 with 6 hours spacing is reassuring.  No expansion of bleed.  Neurosurgery contacted from ED.  Recommend conservative management. Plan: Continue frequent neurochecks Continue to hold aspirin and Plavix x 1 week.  Can resume Monday 11/25 Continue therapy, current recommendation for skilled nursing facility placement [facility] consult for placement Fall precautions   Essential hypertension Continue home regimen: Toprol-XL 25 mg daily Avapro 37.5 mg daily Hydralazine 25 mg 3 times daily   Hyperlipidemia PTA statin   Depression PTA Celexa   Chronic arthritic pain Neuropathic pain Restart home gabapentin Cautious narcotic regimen Voltaren gel   CKD stage IIIb Creatinine at or near baseline Avoid nonessential nephrotoxins Outpatient nephrology  follow-up    DVT prophylaxis: TED hose Code Status: DNR Family Communication: daughter [name] [phone] on 11/19 Disposition Plan: Status is: Observation The patient will require care spanning > 2 midnights and should be moved to inpatient because: Admission post mechanical fall and trace subarachnoid hemorrhage.  Will need skilled nursing facility placement.  Unsafe discharge plan at this time.   Level of care: Med-Surg  Consultants:  None  Procedures:  None  Antimicrobials: None   Subjective: Seen and examined.  Sitting up in chair eating breakfast.  No visible distress.  Mentating clearly.  Objective: Vitals:   03/29/23 2026 03/29/23 2215 03/30/23 0431 03/30/23 0735  BP: (!) 179/77  (!) 144/68 (!) 178/75  Pulse: 76  71 73  Resp: 18 16 17 18   Temp: 98.3 F (36.8 C)  97.6 F (36.4 C) 97.8 F (36.6 C)  TempSrc:   Oral   SpO2: 93%  96% 96%  Weight:      Height:        Intake/Output Summary (Last 24 hours) at 03/30/2023 1120 Last data filed at 03/30/2023 0612 Gross per 24 hour  Intake 240 ml  Output --  Net 240 ml   Filed Weights   03/29/23 0742  Weight: 59 kg    Examination:  General exam: Appears calm and comfortable  Respiratory system: Clear to auscultation. Respiratory effort normal. Cardiovascular system: S1-S2, RRR, no murmurs, no pedal edema Gastrointestinal system: soft,/ND, normal bowel sounds Central nervous system: Alert.  Oriented x 3.  No focal deficits Extremities: Symmetric 5 x 5 power. Skin: No rashes, lesions or ulcers Psychiatry: Judgement and insight appear normal. Mood & affect appropriate.     Data Reviewed:  I have personally reviewed following labs and imaging studies  CBC: Recent Labs  Lab 03/29/23 0744 03/30/23 0418  WBC 8.9 7.0  HGB 12.1 11.1*  HCT 36.8 32.5*  MCV 95.3 91.5  PLT 201 183   Basic Metabolic Panel: Recent Labs  Lab 03/29/23 0744 03/30/23 0418  NA 138 138  K 4.8 3.8  CL 103 106  CO2 25 21*   GLUCOSE 166* 134*  BUN 34* 31*  CREATININE 1.29* 1.29*  CALCIUM 9.4 8.8*   GFR: Estimated Creatinine Clearance: 21.5 mL/min (A) (by C-G formula based on SCr of 1.29 mg/dL (H)). Liver Function Tests: No results for input(s): "AST", "ALT", "ALKPHOS", "BILITOT", "PROT", "ALBUMIN" in the last 168 hours. No results for input(s): "LIPASE", "AMYLASE" in the last 168 hours. No results for input(s): "AMMONIA" in the last 168 hours. Coagulation Profile: No results for input(s): "INR", "PROTIME" in the last 168 hours. Cardiac Enzymes: No results for input(s): "CKTOTAL", "CKMB", "CKMBINDEX", "TROPONINI" in the last 168 hours. BNP (last 3 results) No results for input(s): "PROBNP" in the last 8760 hours. HbA1C: No results for input(s): "HGBA1C" in the last 72 hours. CBG: Recent Labs  Lab 03/29/23 1041 03/29/23 1851  GLUCAP 137* 122*   Lipid Profile: No results for input(s): "CHOL", "HDL", "LDLCALC", "TRIG", "CHOLHDL", "LDLDIRECT" in the last 72 hours. Thyroid Function Tests: No results for input(s): "TSH", "T4TOTAL", "FREET4", "T3FREE", "THYROIDAB" in the last 72 hours. Anemia Panel: No results for input(s): "VITAMINB12", "FOLATE", "FERRITIN", "TIBC", "IRON", "RETICCTPCT" in the last 72 hours. Sepsis Labs: No results for input(s): "PROCALCITON", "LATICACIDVEN" in the last 168 hours.  No results found for this or any previous visit (from the past 240 hour(s)).       Radiology Studies: DG Hip Unilat W or Wo Pelvis 2-3 Views Right  Result Date: 03/29/2023 CLINICAL DATA:  Recent fall with right hip pain, initial encounter EXAM: DG HIP (WITH OR WITHOUT PELVIS) 3V RIGHT COMPARISON:  None Available. FINDINGS: Pelvic ring is intact. No acute fracture or dislocation is noted. No soft tissue abnormality is seen. IMPRESSION: No acute abnormality noted. Electronically Signed   By: Alcide Clever M.D.   On: 03/29/2023 20:38   CT Head Wo Contrast  Result Date: 03/29/2023 CLINICAL DATA:  Head  trauma, intracranial arterial injury suspected f/u [facility] EXAM: CT HEAD WITHOUT CONTRAST TECHNIQUE: Contiguous axial images were obtained from the base of the skull through the vertex without intravenous contrast. RADIATION DOSE REDUCTION: This exam was performed according to the departmental dose-optimization program which includes automated exposure control, adjustment of the mA and/or kV according to patient size and/or use of iterative reconstruction technique. COMPARISON:  Same day CT head FINDINGS: Brain: No hydrocephalus. No extra-axial fluid collection. No mastoid. No mass lesion. Unchanged possible trace subarachnoid hemorrhage in the left sylvian fissure. No CT evidence of acute cortical infarct. There is sequela mild chronic microvascular ischemic change. Vascular: No hyperdense vessel or unexpected calcification. Skull: Soft tissue swelling along the right lateral scalp. No evidence of an calvarial fracture. Sinuses/Orbits: Trace right mastoid effusion. No middle ear effusion. Paranasal sinuses are clear. Bilateral lens replacement. Orbits are otherwise unremarkable. Other: None. IMPRESSION: 1. Unchanged possible trace subarachnoid hemorrhage in the left Sylvian fissure. 2. Soft tissue swelling along the right lateral scalp. No evidence of an calvarial fracture. Electronically Signed   By: Lorenza Cambridge M.D.   On: 03/29/2023 16:59   CT Chest Wo Contrast  Result Date: 03/29/2023 CLINICAL DATA:  Unwitnessed fall.  Chest trauma. EXAM:  CT CHEST WITHOUT CONTRAST TECHNIQUE: Multidetector CT imaging of the chest was performed following the standard protocol without IV contrast. RADIATION DOSE REDUCTION: This exam was performed according to the departmental dose-optimization program which includes automated exposure control, adjustment of the mA and/or kV according to patient size and/or use of iterative reconstruction technique. COMPARISON:  CTA chest dated 08/22/2022 FINDINGS: Cardiovascular: Normal heart  size. No significant pericardial fluid/thickening. Great vessels are normal in course and caliber. Coronary artery calcifications and aortic atherosclerosis. Mediastinum/Nodes: Imaged thyroid gland without nodules meeting criteria for imaging follow-up by size. Normal esophagus. No pathologically enlarged axillary, supraclavicular, mediastinal, or hilar lymph nodes. Lungs/Pleura: The central airways are patent. Similar middle lobe, lingular, and bilateral lower lobe scarring and traction bronchiectasis. No focal consolidation. No pneumothorax. No pleural effusion. Upper abdomen: Multifocal pancreatic hypodensities, for example 2.4 cm hypoattenuating focus arising from the pancreatic neck (4:126), better evaluated on prior CT abdomen examinations. Atrophic left kidney. Musculoskeletal: No acute or abnormal lytic or blastic osseous lesions. Please see separately dictated CT thoracic spine report for detailed findings. Multilevel degenerative changes of the thoracic spine. Right axillary surgical clips. IMPRESSION: 1. No acute or traumatic intrathoracic abnormality. 2. Similar middle lobe, lingular, and bilateral lower lobe scarring and traction bronchiectasis. 3. Multifocal pancreatic hypodensities, better evaluated on prior CT abdomen examinations. 4. Aortic Atherosclerosis (ICD10-I70.0). Coronary artery calcifications. Assessment for potential risk factor modification, dietary therapy or pharmacologic therapy may be warranted, if clinically indicated. Electronically Signed   By: Agustin Cree M.D.   On: 03/29/2023 10:10   CT Cervical Spine Wo Contrast  Result Date: 03/29/2023 CLINICAL DATA:  Neck trauma (Age >= 65y). Fall with neck and back pain. EXAM: CT CERVICAL SPINE WITHOUT CONTRAST CT THORACIC SPINE WITHOUT CONTRAST TECHNIQUE: Multidetector CT imaging of the cervical and thoracic spine was performed without contrast. Multiplanar CT image reconstructions were also generated. RADIATION DOSE REDUCTION: This exam  was performed according to the departmental dose-optimization program which includes automated exposure control, adjustment of the mA and/or kV according to patient size and/or use of iterative reconstruction technique. COMPARISON:  CT cervical spine 06/05/2022. FINDINGS: CT CERVICAL SPINE FINDINGS Alignment: Unchanged trace retrolisthesis of C3 on C4 and 3 mm anterolisthesis of C7 on T1. No traumatic malalignment. Skull base and vertebrae: No acute fracture. Normal craniocervical junction. No suspicious bone lesions. Soft tissues and spinal canal: No prevertebral fluid or swelling. No visible canal hematoma. Disc levels: Multilevel cervical spondylosis, worst at C3-4, where there is at least mild spinal canal stenosis. Upper chest: No acute findings. Other: Atherosclerotic calcifications of the carotid bulbs. CT THORACIC SPINE FINDINGS Alignment: Exaggerated kyphosis of the upper thoracic spine. Vertebrae: Diffuse idiopathic skeletal hyperostosis. No acute fracture or suspicious bone lesion. Paraspinal and other soft tissues: Please refer to same-day chest CT report. Disc levels: No disc herniation, spinal canal stenosis or neural foraminal narrowing. IMPRESSION: 1. No acute fracture or traumatic malalignment of the cervical or thoracic spine. 2. Multilevel cervical spondylosis, worst at C3-4, where there is at least mild spinal canal stenosis. Electronically Signed   By: Orvan Falconer M.D.   On: 03/29/2023 10:08   CT T-SPINE NO CHARGE  Result Date: 03/29/2023 CLINICAL DATA:  Neck trauma (Age >= 65y). Fall with neck and back pain. EXAM: CT CERVICAL SPINE WITHOUT CONTRAST CT THORACIC SPINE WITHOUT CONTRAST TECHNIQUE: Multidetector CT imaging of the cervical and thoracic spine was performed without contrast. Multiplanar CT image reconstructions were also generated. RADIATION DOSE REDUCTION: This exam was performed according to  the departmental dose-optimization program which includes automated exposure  control, adjustment of the mA and/or kV according to patient size and/or use of iterative reconstruction technique. COMPARISON:  CT cervical spine 06/05/2022. FINDINGS: CT CERVICAL SPINE FINDINGS Alignment: Unchanged trace retrolisthesis of C3 on C4 and 3 mm anterolisthesis of C7 on T1. No traumatic malalignment. Skull base and vertebrae: No acute fracture. Normal craniocervical junction. No suspicious bone lesions. Soft tissues and spinal canal: No prevertebral fluid or swelling. No visible canal hematoma. Disc levels: Multilevel cervical spondylosis, worst at C3-4, where there is at least mild spinal canal stenosis. Upper chest: No acute findings. Other: Atherosclerotic calcifications of the carotid bulbs. CT THORACIC SPINE FINDINGS Alignment: Exaggerated kyphosis of the upper thoracic spine. Vertebrae: Diffuse idiopathic skeletal hyperostosis. No acute fracture or suspicious bone lesion. Paraspinal and other soft tissues: Please refer to same-day chest CT report. Disc levels: No disc herniation, spinal canal stenosis or neural foraminal narrowing. IMPRESSION: 1. No acute fracture or traumatic malalignment of the cervical or thoracic spine. 2. Multilevel cervical spondylosis, worst at C3-4, where there is at least mild spinal canal stenosis. Electronically Signed   By: Orvan Falconer M.D.   On: 03/29/2023 10:08   DG Knee Complete 4 Views Right  Result Date: 03/29/2023 CLINICAL DATA:  Fall.  Right knee pain. EXAM: RIGHT KNEE - COMPLETE 4+ VIEW COMPARISON:  None Available. FINDINGS: There is diffuse osteopenia of the visualized osseous structures. No acute fracture or dislocation. No aggressive osseous lesion. There are degenerative changes of the knee joint in the form of mildly reduced medial tibio-femoral compartment joint space, tibial spiking and osteophytosis. Note is also made of meniscal chondrocalcinosis. No knee effusion or focal soft tissue swelling. No radiopaque foreign bodies. IMPRESSION: *No  acute osseous abnormality of the right knee joint. *Mild degenerative osteoarthritis. Electronically Signed   By: Jules Schick M.D.   On: 03/29/2023 08:59   CT HEAD WO CONTRAST  Result Date: 03/29/2023 CLINICAL DATA:  Head trauma, minor EXAM: CT HEAD WITHOUT CONTRAST TECHNIQUE: Contiguous axial images were obtained from the base of the skull through the vertex without intravenous contrast. RADIATION DOSE REDUCTION: This exam was performed according to the departmental dose-optimization program which includes automated exposure control, adjustment of the mA and/or kV according to patient size and/or use of iterative reconstruction technique. COMPARISON:  08/22/2022 FINDINGS: Brain: Small high-density area at the lower left sylvian fissure, more globular than expected for hyperdense MCA, although contiguous with the MCA. No reported MCA syndrome. No evidence of acute infarct, hydrocephalus, mass, or collection. Vascular: Atheromatous calcification Skull: Right parietal scalp swelling without underlying fracture. Sinuses/Orbits: No evidence of injury Critical Value/emergent results were called by telephone at the time of interpretation on 03/29/2023 at 8:24 am to provider Jene Every , who verbally acknowledged these results. IMPRESSION: Tiny subarachnoid hemorrhage at the lower left sylvian fissure, likely traumatic, see reformats. Right parietal scalp hematoma without calvarial fracture. Electronically Signed   By: Tiburcio Pea M.D.   On: 03/29/2023 08:30        Scheduled Meds:  anastrozole  1 mg Oral Daily   citalopram  10 mg Oral Daily   cyanocobalamin  1,000 mcg Oral Daily   diclofenac Sodium  4 g Topical QID   gabapentin  300 mg Oral BID   hydrALAZINE  25 mg Oral TID   irbesartan  37.5 mg Oral Daily   metoprolol succinate  25 mg Oral QHS   simvastatin  20 mg Oral QPM   triamcinolone  cream   Topical BID   Continuous Infusions:   LOS: 0 days     Tresa Moore, MD [facility]   If 7PM-7AM, please contact night-coverage  03/30/2023, 11:20 AM

## 2023-03-30 NOTE — Evaluation (Signed)
Physical Therapy Evaluation Patient Details Name: Tina Curtis MRN: [id number] [name]: 02-Nov-1926 Today's Date: 03/30/2023  History of Present Illness  Tina Curtis is a 89yoF who comes to [facility] on 03/29/23 after fall at home at [facility]. Endorses Rt knee pain.  Clinical Impression  Pt in bed on arrival, asleep, easily made aware, but is slightly off cognitively that her described baseline impairment per OP neurology. Pt even acknowledges some difficulty with train of thought toward EOS, although this correlates with sustained upright time, decreased alertness, and elevated SBP in 200s. Pt's knee pain is quite limiting which is making transfers quite difficult. AMB is slow and again limited by knee. Author notes some foot drop on Left side in gait, but pt is not especially helpful on background of this- correlate chronic neuropathy and/or prior Lumbar spine imaging April 2024. Will continue to follow.       If plan is discharge home, recommend the following: A little help with bathing/dressing/bathroom;A little help with walking and/or transfers   Can travel by private vehicle   No    Equipment Recommendations None recommended by PT  Recommendations for Other Services       Functional Status Assessment Patient has had a recent decline in their functional status and demonstrates the ability to make significant improvements in function in a reasonable and predictable amount of time.     Precautions / Restrictions Precautions Precautions: Fall Restrictions Weight Bearing Restrictions: No      Mobility  Bed Mobility Overal bed mobility: Needs Assistance Bed Mobility: Supine to Sit, Sit to Supine     Supine to sit: Supervision Sit to supine: Supervision        Transfers Overall transfer level: Needs assistance Equipment used: Rolling walker (2 wheels) Transfers: Sit to/from Stand Sit to Stand: Min assist           General transfer comment: painful, unable to rise from  low EOB;    Ambulation/Gait   Gait Distance (Feet): 160 Feet Assistive device: Rolling walker (2 wheels)         General Gait Details: antalgic RLE; noted foot drop on left with steppage compensation, pt reports new to this fall episode (but subsequently described as toe-in, so unclear if she is aware) Does have partial ankle DF, toe DF, 4+/5 Left hamstrings  Stairs            Wheelchair Mobility     Tilt Bed    Modified Rankin (Stroke Patients Only)       Balance                                             Pertinent Vitals/Pain Pain Assessment Pain Assessment: 0-10 Pain Score: 5  Pain Location: rt knee Pain Intervention(s): Limited activity within patient's tolerance, Monitored during session    Home Living Family/patient expects to be discharged to:: Private residence Living Arrangements: Alone Available Help at Discharge:  (staff) Type of Home: Independent living facility Home Access: Level entry       Home Layout: One level Home Equipment: Rollator (4 wheels);Rolling Walker (2 wheels)      Prior Function Prior Level of Function : Independent/Modified Independent;Driving             Mobility Comments: Mod indep with 3WRW in home, 4WRW in community; not driving anymore; reports bilat neuropathy, reports chronic orthostatic  hypotension ADLs Comments: independent. Pt states she used to enjoy quilting (hand quilting), but due to macular degeneration doesn't do that anymore. 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     Extremity/Trunk Assessment                Communication      Cognition Arousal:  (a little confused, some ST memory impairment within my visit; worse with progressive time on feet, but improved with return to rest (SBP in 200s post AMB))                                              General Comments      Exercises     Assessment/Plan    PT Assessment Patient needs  continued PT services  PT Problem List Decreased strength;Decreased range of motion;Decreased activity tolerance;Decreased balance;Decreased cognition       PT Treatment Interventions DME instruction;Gait training;Stair training;Functional mobility training;Therapeutic exercise;Balance training;Therapeutic activities;Patient/family education    PT Goals (Current goals can be found in the Care Plan section)  Acute Rehab PT Goals Patient Stated Goal: avoid another fall, improve bnalance PT Goal Formulation: With patient Time For Goal Achievement: 04/13/23 Potential to Achieve Goals: Fair    Frequency Min 1X/week     Co-evaluation               AM-PAC PT "6 Clicks" Mobility  Outcome Measure Help needed turning from your back to your side while in a flat bed without using bedrails?: A Little Help needed moving from lying on your back to sitting on the side of a flat bed without using bedrails?: A Little Help needed moving to and from a bed to a chair (including a wheelchair)?: A Lot Help needed standing up from a chair using your arms (e.g., wheelchair or bedside chair)?: A Lot Help needed to walk in hospital room?: A Lot Help needed climbing 3-5 steps with a railing? : A Lot 6 Click Score: 14    End of Session Equipment Utilized During Treatment: Gait belt Activity Tolerance: Patient tolerated treatment well;Patient limited by pain;Treatment limited secondary to medical complications (Comment) (decreased alertness during time up, elevated BP) Patient left: in chair (with breakfast tray) Nurse Communication: Mobility status PT Visit Diagnosis: Unsteadiness on feet (R26.81)    Time: 8295-6213 PT Time Calculation (min) (ACUTE ONLY): 47 min   Charges:   PT Evaluation $PT Eval Low Complexity: 1 Low PT Treatments $Therapeutic Activity: 23-37 mins PT General Charges $$ ACUTE PT VISIT: 1 Visit       12:59 PM, 03/30/23 Rosamaria Lints, PT, DPT Physical Therapist - [facility]  [phone] (ASCOM)    Brixon Zhen C 03/30/2023, 12:56 PM

## 2023-03-30 NOTE — Plan of Care (Signed)
  Problem: Education: Goal: Knowledge of General Education information will improve Description: Including pain rating scale, medication(s)/side effects and non-pharmacologic comfort measures Outcome: Progressing   Problem: Health Behavior/Discharge Planning: Goal: Ability to manage health-related needs will improve Outcome: Progressing   Problem: Clinical Measurements: Goal: Ability to maintain clinical measurements within normal limits will improve Outcome: Progressing Goal: Will remain free from infection Outcome: Progressing Goal: Diagnostic test results will improve Outcome: Progressing Goal: Respiratory complications will improve Outcome: Progressing Goal: Cardiovascular complication will be avoided Outcome: Progressing   Problem: Activity: Goal: Risk for activity intolerance will decrease Outcome: Progressing   Problem: Pain Management: Goal: General experience of comfort will improve Outcome: Progressing   Problem: Safety: Goal: Ability to remain free from injury will improve Outcome: Progressing   Problem: Elimination: Goal: Will not experience complications related to urinary retention Outcome: Progressing

## 2023-03-30 NOTE — TOC Progression Note (Signed)
Transition of Care [facility]) - Progression Note    Patient Details  Name: Tina Curtis MRN: [id number] Date of Birth: 1926/09/13  [facility]) CM/SW Contact  Garret Reddish, RN Phone Number: 03/30/2023, 2:05 PM  Clinical Narrative:    Chart reviewed. Noted that patient is a resident of [facility]  Noted that PT has recommended SNF on discharge.   I have asked [facility] to look at patient for possible admission.   TOC will continue to follow for discharge planning.        Expected Discharge Plan and Services                                               Social Determinants of Health (SDOH) Interventions SDOH Screenings   Food Insecurity: No Food Insecurity (03/29/2023)  Housing: Low Risk  (03/29/2023)  Transportation Needs: No Transportation Needs (03/29/2023)  Utilities: Not At Risk (03/29/2023)  Financial Resource Strain: Low Risk  (11/17/2022)   Received from [facility], [facility]  Tobacco Use: Low Risk  (03/29/2023)    Readmission Risk Interventions    06/08/2022    3:30 PM  Readmission Risk Prevention Plan  Transportation Screening Complete  Palliative Care Screening Not Applicable  Medication Review (RN Care Manager) Complete

## 2023-03-30 NOTE — Evaluation (Signed)
Occupational Therapy Evaluation Patient Details Name: Tina Curtis MRN: [id number] [name]: 1927/02/24 Today's Date: 03/30/2023   History of Present Illness 89 y.o. female with medical history significant of hypertension, chronic headache, CVA on dual antiplatelet therapy, neuropathic pain, hyperlipidemia who presents from her ILF- [facility] after mechanical fall in her shower. Has R knee pain and CT head: possible trace subarachnoid hemorrhage in the left Sylvian fissure   Clinical Impression   Pt was seen for OT evaluation this date. Prior to hospital admission, pt was living at [facility] independent living facility where she ambulated with a rollator. Reports IND with ADLs and IADLs, driving and community mobile.  Pt presents to acute OT demonstrating impaired ADL performance and functional mobility 2/2 weakness, pain, balance deficits, and limited activity tolerance (See OT problem list for additional functional deficits). Moderate R knee pain reported. Pt currently requires Min/Mod A for STS with cueing to push up from recliner. Mod A for LB ADLs d/t R knee pain.  Requires Min/CGA for safety in standing d/t occasional posterior lean mostly with initial standing. Constant CGA to stand at sink and perform oral care and for toilet transfer/hygiene. Ambulated from recliner to bathroom and back using RW with CGA, cueing for safety. Pt would benefit from skilled OT services to address noted impairments and functional limitations (see below for any additional details) in order to maximize safety and independence while minimizing falls risk and caregiver burden. Do anticipate the need for follow up OT services upon acute hospital DC.        If plan is discharge home, recommend the following: A little help with walking and/or transfers;A lot of help with bathing/dressing/bathroom;Assistance with cooking/housework;Help with stairs or ramp for entrance;Assist for transportation;Direct supervision/assist  for medications management;Supervision due to cognitive status;Direct supervision/assist for financial management    Functional Status Assessment  Patient has had a recent decline in their functional status and demonstrates the ability to make significant improvements in function in a reasonable and predictable amount of time.  Equipment Recommendations  BSC/3in1    Recommendations for Other Services       Precautions / Restrictions Precautions Precautions: Fall Restrictions Weight Bearing Restrictions: No      Mobility Bed Mobility               General bed mobility comments: NT up in recliner pre/post session    Transfers Overall transfer level: Needs assistance Equipment used: Rolling walker (2 wheels) Transfers: Sit to/from Stand Sit to Stand: Min assist, Mod assist           General transfer comment: Min/Mod A for STS from recliner and CGA for mobility using RW to bathroom and back; posterior lean with inital STS      Balance Overall balance assessment: Needs assistance Sitting-balance support: Feet supported, No upper extremity supported Sitting balance-Leahy Scale: Good   Postural control: Posterior lean Standing balance support: During functional activity, Reliant on assistive device for balance, Bilateral upper extremity supported Standing balance-Leahy Scale: Fair Standing balance comment: initial posterior lean with standing, but then improves with time and walking                           ADL either performed or assessed with clinical judgement   ADL Overall ADL's : Needs assistance/impaired     Grooming: Wash/dry hands;Oral care;Standing;Contact guard assist Grooming Details (indicate cue type and reason): CGA for balance standing at sink  Lower Body Dressing: Moderate assistance Lower Body Dressing Details (indicate cue type and reason): able to don L sock, but unable to perform on R side d/t R knee pain Toilet  Transfer: Minimal assistance;Contact guard assist;Grab bars;Comfort height toilet;BSC/3in1;Ambulation;Rolling walker (2 wheels) Toilet Transfer Details (indicate cue type and reason): BSC placed over toilet d/t low height Toileting- Clothing Manipulation and Hygiene: Contact guard assist;Sit to/from stand;Sitting/lateral lean Toileting - Clothing Manipulation Details (indicate cue type and reason): CGA to maintain balance     Functional mobility during ADLs: Contact guard assist;Rolling walker (2 wheels) General ADL Comments: pt easily distracted, will stop and talk while standing at RW then go back to walking     Vision         Perception         Praxis         Pertinent Vitals/Pain Pain Assessment Pain Assessment: Faces Faces Pain Scale: Hurts little more Pain Location: rt knee Pain Descriptors / Indicators: Aching, Sore Pain Intervention(s): Monitored during session, Limited activity within patient's tolerance     Extremity/Trunk Assessment Upper Extremity Assessment Upper Extremity Assessment: Overall WFL for tasks assessed   Lower Extremity Assessment Lower Extremity Assessment: Generalized weakness       Communication Communication Communication: No apparent difficulties   Cognition Arousal: Alert Behavior During Therapy: WFL for tasks assessed/performed Overall Cognitive Status: No family/caregiver present to determine baseline cognitive functioning                                 General Comments: at times confused thinking she was at ILF, then would correct herself and realize she was in the hospital; possible STM deficits     General Comments  BP after oral care standing at sink and toileting 176/77    Exercises Other Exercises Other Exercises: Edu on role of OT in acute setting and importance of therapy for recovery.   Shoulder Instructions      Home Living Family/patient expects to be discharged to:: Private residence Living  Arrangements: Alone Available Help at Discharge:  (staff) Type of Home: Independent living facility Home Access: Level entry     Home Layout: One level     Bathroom Shower/Tub: Runner, broadcasting/film/video: Rollator (4 wheels);Rolling Walker (2 wheels)          Prior Functioning/Environment Prior Level of Function : Independent/Modified Independent;Driving             Mobility Comments: Mod indep with 3WRW in home, 4WRW in community; not driving anymore; reports bilat neuropathy, reports chronic orthostatic hypotension ADLs Comments: independent. Pt states she used to enjoy quilting (hand quilting), but due to macular degeneration doesn't do that anymore. 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        OT Problem List: Decreased strength;Pain;Decreased activity tolerance;Impaired balance (sitting and/or standing)      OT Treatment/Interventions: Self-care/ADL training;Therapeutic exercise;Therapeutic activities;Energy conservation;DME and/or AE instruction;Patient/family education;Balance training    OT Goals(Current goals can be found in the care plan section) Acute Rehab OT Goals Patient Stated Goal: improve strength OT Goal Formulation: With patient Time For Goal Achievement: 04/13/23 Potential to Achieve Goals: Good ADL Goals Pt Will Perform Lower Body Bathing: sitting/lateral leans;sit to/from stand;with contact guard assist Pt Will Perform Lower Body Dressing: with contact guard assist;sitting/lateral leans;sit to/from stand Pt Will Transfer to Toilet: with supervision;ambulating;bedside commode Pt Will  Perform Toileting - Clothing Manipulation and hygiene: with supervision;sit to/from stand;sitting/lateral leans  OT Frequency: Min 1X/week    Co-evaluation              AM-PAC OT "6 Clicks" Daily Activity     Outcome Measure Help from another person eating meals?: None Help from another person taking care of personal grooming?:  None Help from another person toileting, which includes using toliet, bedpan, or urinal?: A Little Help from another person bathing (including washing, rinsing, drying)?: A Lot Help from another person to put on and taking off regular upper body clothing?: None Help from another person to put on and taking off regular lower body clothing?: A Lot 6 Click Score: 19   End of Session Equipment Utilized During Treatment: Rolling walker (2 wheels)  Activity Tolerance: Patient tolerated treatment well Patient left: in chair;with call bell/phone within reach  OT Visit Diagnosis: Other abnormalities of gait and mobility (R26.89);Unsteadiness on feet (R26.81);History of falling (Z91.81);Pain Pain - Right/Left: Right Pain - part of body: Knee                Time: 1002-1040 OT Time Calculation (min): 38 min Charges:  OT General Charges $OT Visit: 1 Visit OT Evaluation $OT Eval Moderate Complexity: 1 Mod OT Treatments $Self Care/Home Management : 23-37 mins Kimiah Hibner, OTR/L 03/30/23, 1:16 PM  Yunis Voorheis E Freddrick Gladson 03/30/2023, 1:12 PM

## 2023-03-30 NOTE — Plan of Care (Signed)

## 2023-03-31 DIAGNOSIS — I609 Nontraumatic subarachnoid hemorrhage, unspecified: Secondary | ICD-10-CM | POA: Diagnosis not present

## 2023-03-31 DIAGNOSIS — W19XXXA Unspecified fall, initial encounter: Secondary | ICD-10-CM | POA: Diagnosis not present

## 2023-03-31 LAB — CBC
HCT: 36.5 % (ref 36.0–46.0)
Hemoglobin: 12.5 g/dL (ref 12.0–15.0)
MCH: 31 pg (ref 26.0–34.0)
MCHC: 34.2 g/dL (ref 30.0–36.0)
MCV: 90.6 fL (ref 80.0–100.0)
Platelets: 191 10*3/uL (ref 150–400)
RBC: 4.03 MIL/uL (ref 3.87–5.11)
RDW: 13.8 % (ref 11.5–15.5)
WBC: 7.2 10*3/uL (ref 4.0–10.5)
nRBC: 0 % (ref 0.0–0.2)

## 2023-03-31 LAB — BASIC METABOLIC PANEL
Anion gap: 8 (ref 5–15)
BUN: 30 mg/dL — ABNORMAL HIGH (ref 8–23)
CO2: 22 mmol/L (ref 22–32)
Calcium: 8.7 mg/dL — ABNORMAL LOW (ref 8.9–10.3)
Chloride: 108 mmol/L (ref 98–111)
Creatinine, Ser: 1.42 mg/dL — ABNORMAL HIGH (ref 0.44–1.00)
GFR, Estimated: 34 mL/min — ABNORMAL LOW (ref >60)
Glucose, Bld: 158 mg/dL — ABNORMAL HIGH (ref 70–99)
Potassium: 4.2 mmol/L (ref 3.5–5.1)
Sodium: 138 mmol/L (ref 135–145)

## 2023-03-31 LAB — PHOSPHORUS: Phosphorus: 2.8 mg/dL (ref 2.5–4.6)

## 2023-03-31 LAB — MAGNESIUM: Magnesium: 2.3 mg/dL (ref 1.7–2.4)

## 2023-03-31 MED ORDER — METOPROLOL SUCCINATE ER 25 MG PO TB24
25.0000 mg | ORAL_TABLET | Freq: Every day | ORAL | Status: DC
  Administered 2023-03-31 – 2023-04-02 (×3): 25 mg via ORAL
  Filled 2023-03-31 (×3): qty 1

## 2023-03-31 MED ORDER — HYDRALAZINE HCL 50 MG PO TABS
50.0000 mg | ORAL_TABLET | Freq: Three times a day (TID) | ORAL | Status: DC
  Administered 2023-03-31 – 2023-04-02 (×7): 50 mg via ORAL
  Filled 2023-03-31 (×6): qty 1

## 2023-03-31 MED ORDER — AMLODIPINE BESYLATE 5 MG PO TABS
5.0000 mg | ORAL_TABLET | Freq: Every evening | ORAL | Status: DC
  Administered 2023-03-31 – 2023-04-01 (×2): 5 mg via ORAL
  Filled 2023-03-31 (×2): qty 1

## 2023-03-31 MED ORDER — HYDRALAZINE HCL 20 MG/ML IJ SOLN
10.0000 mg | Freq: Four times a day (QID) | INTRAMUSCULAR | Status: DC | PRN
  Administered 2023-03-31: 10 mg via INTRAVENOUS
  Filled 2023-03-31: qty 1

## 2023-03-31 MED ORDER — HYDROXYZINE HCL 10 MG PO TABS: 10.0000 mg | ORAL_TABLET | Freq: Three times a day (TID) | ORAL | Status: DC | PRN

## 2023-03-31 NOTE — Progress Notes (Signed)
[facility] Progress Note  Patient: Tina Curtis    DGU:[id number]  [name]: 03/29/2023     Date of Service: the patient was seen and examined on 03/31/2023  Chief Complaint  Patient presents with   Fall   Knee Pain   Brief hospital course: 89 y.o. female with medical history significant of hypertension, chronic headache, CVA on dual antiplatelet therapy, neuropathic pain, hyperlipidemia who presents from her independent living facility [facility] after mechanical fall.  Patient states she was walking through her house and fell.  Denies loss of consciousness.  Does not recall why she fell.   On initial evaluation patient was found to have a trace subarachnoid hemorrhage.  EDP contacted neurosurgery who recommended repeat head CT within 6 hours.  This was completed and small bleed was stable.  Neurosurgery recommends conservative management and holding dual antiplatelet therapy x 1 week.   Patient will require skilled nursing facility placement so [facility] was contacted for admission.   Patient seen in consultation by PT and OT.  Recommendation for short-term rehab.  TOC aware and will reach out to [facility]   Assessment and Plan: Principal Problem:   Fall  Mechanical fall Trace subarachnoid hemorrhage Patient is alert and oriented x 4.  Head CT x 2 with 6 hours spacing is reassuring.  No expansion of bleed.  Neurosurgery contacted from ED.  Recommend conservative management. Plan: Continue frequent neurochecks Continue to hold aspirin and Plavix x 1 week.  Can resume Monday 11/25 Continue therapy, current recommendation for skilled nursing facility placement [facility] consult for placement Fall precautions   Essential hypertension Continue home regimen: Toprol-XL 25 mg daily Avapro 37.5 mg daily 11/20 Increased Hydralazine 50 mg 3 times daily 11/20 Started amlodipine 5 mg p.o. every evening if SBP greater than 140 mmHg. Monitor BP and titrate medication  accordingly   Hyperlipidemia PTA statin   Depression PTA Celexa   Chronic arthritic pain Neuropathic pain Restart home gabapentin Cautious narcotic regimen Voltaren gel   CKD stage IIIb Creatinine at or near baseline Avoid nonessential nephrotoxins Outpatient nephrology follow-up   Body mass index is 23.78 kg/m.  Interventions:  Diet: Regular diet DVT Prophylaxis: SCD, pharmacological prophylaxis contraindicated due to ICH    Advance goals of care discussion: DNR/DNI-Limited  Family Communication: family was present at bedside, at the time of interview.  The pt provided permission to discuss medical plan with the family. Opportunity was given to ask question and all questions were answered satisfactorily.   Disposition:  Pt is from ILF, admitted with fall and intracranial hemorrhage, clinically stable, medically optimized and cleared by neurosurgery. PT/OT eval done, recommend SNF placement Ready to discharge, follow TOC for disposition plan to SNF   Subjective: No significant events overnight, patient is complaining of worsening of neuropathy, extending from bilateral lower extremities to upper body but it is not constant, it seems like that she is having more anxiety.  Patient is also having tenderness in the bilateral lower rib area, no any other complaints.  Physical Exam: General: NAD, lying comfortably Appear in no distress, affect appropriate Eyes: PERRLA ENT: Oral Mucosa Clear, moist  Neck: no JVD,  Cardiovascular: S1 and S2 Present, no Murmur,  Respiratory: good respiratory effort, Bilateral Air entry equal and Decreased, no Crackles, no wheezes Abdomen: Bowel Sound present, Soft and no tenderness,  Skin: no rashes Extremities: no Pedal edema, no calf tenderness Neurologic: without any new focal findings Gait not checked due to patient safety concerns  Vitals:  03/31/23 0458 03/31/23 0754 03/31/23 0905 03/31/23 1312  BP: (!) 154/78 (!) 207/84 (!)  169/81 (!) 147/71  Pulse: 68 65  79  Resp: 18 17    Temp: 97.7 F (36.5 C) 97.6 F (36.4 C)    TempSrc:      SpO2: 97% 98%  98%  Weight:      Height:       No intake or output data in the 24 hours ending 03/31/23 1329 Filed Weights   03/29/23 0742  Weight: 59 kg    Data Reviewed: I have personally reviewed and interpreted daily labs, tele strips, imagings as discussed above. I reviewed all nursing notes, pharmacy notes, vitals, pertinent old records I have discussed plan of care as described above with RN and patient/family.  CBC: Recent Labs  Lab 03/29/23 0744 03/30/23 0418 03/31/23 0854  WBC 8.9 7.0 7.2  HGB 12.1 11.1* 12.5  HCT 36.8 32.5* 36.5  MCV 95.3 91.5 90.6  PLT 201 183 191   Basic Metabolic Panel: Recent Labs  Lab 03/29/23 0744 03/30/23 0418 03/31/23 0854  NA 138 138 138  K 4.8 3.8 4.2  CL 103 106 108  CO2 25 21* 22  GLUCOSE 166* 134* 158*  BUN 34* 31* 30*  CREATININE 1.29* 1.29* 1.42*  CALCIUM 9.4 8.8* 8.7*  MG  --   --  2.3  PHOS  --   --  2.8    Studies: No results found.  Scheduled Meds:  amLODipine  5 mg Oral QPM   anastrozole  1 mg Oral Daily   citalopram  10 mg Oral Daily   cyanocobalamin  1,000 mcg Oral Daily   diclofenac Sodium  4 g Topical QID   gabapentin  300 mg Oral BID   hydrALAZINE  50 mg Oral TID   irbesartan  37.5 mg Oral Daily   metoprolol succinate  25 mg Oral Daily   simvastatin  20 mg Oral QPM   triamcinolone cream   Topical BID   Continuous Infusions: PRN Meds: acetaminophen **OR** acetaminophen, albuterol, hydrALAZINE, melatonin, morphine injection, ondansetron **OR** ondansetron (ZOFRAN) IV, oxyCODONE, senna-docusate  Time spent: 35 minutes  Author: Gillis Santa. MD [facility] 03/31/2023 1:29 PM  To reach On-call, see care teams to locate the attending and reach out to them via [email or web address]. If 7PM-7AM, please contact night-coverage If you still have difficulty reaching the attending provider,  please page the [facility] (Director on Call) for [facility] on amion for assistance.

## 2023-03-31 NOTE — Plan of Care (Signed)
  Problem: Clinical Measurements: Goal: Ability to maintain clinical measurements within normal limits will improve Outcome: Progressing   Problem: Pain Management: Goal: General experience of comfort will improve Outcome: Progressing   Problem: Safety: Goal: Ability to remain free from injury will improve Outcome: Progressing

## 2023-03-31 NOTE — TOC Progression Note (Signed)
Transition of Care [facility]) - Progression Note    Patient Details  Name: Tina Curtis MRN: [id number] Date of Birth: Jul 24, 1926  Transition of Care [facility] CM/SW Contact  Garret Reddish, RN Phone Number: 03/31/2023, 3:03 PM  Clinical Narrative:    Chart reviewed.  I have spoken with Sue Lush, Admisison Coordinator at [facility].  She informs me that she can accept patient pending insurance authorization.    SNF approval submitted to [facility]. Pending Auth ID number is [id number].  TOC will continue to follow for discharge planning.          Expected Discharge Plan and Services                                               Social Determinants of Health (SDOH) Interventions SDOH Screenings   Food Insecurity: No Food Insecurity (03/29/2023)  Housing: Low Risk  (03/29/2023)  Transportation Needs: No Transportation Needs (03/29/2023)  Utilities: Not At Risk (03/29/2023)  Financial Resource Strain: Low Risk  (11/17/2022)   Received from [facility], [facility]  Tobacco Use: Low Risk  (03/29/2023)    Readmission Risk Interventions    06/08/2022    3:30 PM  Readmission Risk Prevention Plan  Transportation Screening Complete  Palliative Care Screening Not Applicable  Medication Review (RN Care Manager) Complete

## 2023-03-31 NOTE — Progress Notes (Signed)
Physical Therapy Treatment Patient Details Name: Tina Curtis MRN: [id number] [name]: [birth year] Today's Date: 03/31/2023   History of Present Illness 89 y.o. female with medical history significant of hypertension, chronic headache, CVA on dual antiplatelet therapy, neuropathic pain, hyperlipidemia who presents from her ILF- [facility] after mechanical fall in her shower. Has R knee pain and CT head: possible trace subarachnoid hemorrhage in the left Sylvian fissure    PT Comments  Pt was sitting in recliner upon arrival. She is alert and agreeable but does seem to have some STM deficits. She was eager to attempt ambulation and to return to [facility] for rehab.  Pt was able to stand with assistance, ambulate ~ 100 ft with RW. Pt remains high fall risk withy two occasions of posterior LOB with intervention to prevent fall. She remains far from her baseline abilities and will benefit from continued skilled PT at DC to maximize independence and safety with all ADLs. Pt requested to use rollator next session.    If plan is discharge home, recommend the following: A little help with bathing/dressing/bathroom;A little help with walking and/or transfers     Equipment Recommendations  Other (comment) (Defer to next level of care)       Precautions / Restrictions Precautions Precautions: Fall Restrictions Weight Bearing Restrictions: No     Mobility  Bed Mobility Overal bed mobility: Needs Assistance Bed Mobility: Sit to Supine  Sit to supine: Min assist General bed mobility comments: min assist to progress LEs back into bed form EOB sitting    Transfers Overall transfer level: Needs assistance Equipment used: Rolling walker (2 wheels) ((requesting to use rollator next session)) Transfers: Sit to/from Stand Sit to Stand: Min assist, Mod assist  General transfer comment: Min from slightly elevated surface but mod from lower    Ambulation/Gait Ambulation/Gait assistance: Contact guard  assist, Min assist Gait Distance (Feet): 100 Feet Assistive device: Rolling walker (2 wheels) Gait Pattern/deviations: Step-through pattern, Narrow base of support, Staggering left, Staggering right Gait velocity: decreased  General Gait Details: Pt was able to ambulate ~ 100 ft with RW. did have two episodes of posterior LOB with therapist intervention to prevent fall. Pt remains high fall risk     Balance Overall balance assessment: Needs assistance Sitting-balance support: Feet supported, No upper extremity supported Sitting balance-Leahy Scale: Good     Standing balance support: During functional activity, Reliant on assistive device for balance, Bilateral upper extremity supported Standing balance-Leahy Scale: Poor Standing balance comment: pt has several occasiosn of posterior LOB with intervention this session       Cognition Arousal: Alert Behavior During Therapy: WFL for tasks assessed/performed Overall Cognitive Status: No family/caregiver present to determine baseline cognitive functioning                Pertinent Vitals/Pain Pain Assessment Pain Assessment: 0-10 Pain Score: 4  Pain Location: rt knee Pain Descriptors / Indicators: Aching, Sore Pain Intervention(s): Limited activity within patient's tolerance, Monitored during session, Repositioned, Premedicated before session     PT Goals (current goals can now be found in the care plan section) Acute Rehab PT Goals Patient Stated Goal: return to [facility] Progress towards PT goals: Progressing toward goals    Frequency    Min 1X/week       AM-PAC PT "6 Clicks" Mobility   Outcome Measure  Help needed turning from your back to your side while in a flat bed without using bedrails?: A Little Help needed moving from lying  on your back to sitting on the side of a flat bed without using bedrails?: A Little Help needed moving to and from a bed to a chair (including a wheelchair)?: A Lot Help needed  standing up from a chair using your arms (e.g., wheelchair or bedside chair)?: A Lot Help needed to walk in hospital room?: A Little Help needed climbing 3-5 steps with a railing? : A Lot 6 Click Score: 15    End of Session   Activity Tolerance: Patient tolerated treatment well;Patient limited by fatigue Patient left: in bed;with call bell/phone within reach;with bed alarm set Nurse Communication: Mobility status PT Visit Diagnosis: Unsteadiness on feet (R26.81)     Time: 1540-1606 PT Time Calculation (min) (ACUTE ONLY): 26 min  Charges:    $Gait Training: 8-22 mins $Therapeutic Activity: 8-22 mins PT General Charges $$ ACUTE PT VISIT: 1 Visit                    Jetta Lout PTA 03/31/23, 4:45 PM

## 2023-04-01 DIAGNOSIS — I609 Nontraumatic subarachnoid hemorrhage, unspecified: Secondary | ICD-10-CM | POA: Diagnosis not present

## 2023-04-01 DIAGNOSIS — W19XXXA Unspecified fall, initial encounter: Secondary | ICD-10-CM | POA: Diagnosis not present

## 2023-04-01 LAB — CBC
HCT: 36 % (ref 36.0–46.0)
Hemoglobin: 12.1 g/dL (ref 12.0–15.0)
MCH: 30.9 pg (ref 26.0–34.0)
MCHC: 33.6 g/dL (ref 30.0–36.0)
MCV: 92.1 fL (ref 80.0–100.0)
Platelets: 200 10*3/uL (ref 150–400)
RBC: 3.91 MIL/uL (ref 3.87–5.11)
RDW: 13.9 % (ref 11.5–15.5)
WBC: 8.9 10*3/uL (ref 4.0–10.5)
nRBC: 0 % (ref 0.0–0.2)

## 2023-04-01 LAB — BASIC METABOLIC PANEL
Anion gap: 10 (ref 5–15)
BUN: 39 mg/dL — ABNORMAL HIGH (ref 8–23)
CO2: 21 mmol/L — ABNORMAL LOW (ref 22–32)
Calcium: 8.5 mg/dL — ABNORMAL LOW (ref 8.9–10.3)
Chloride: 107 mmol/L (ref 98–111)
Creatinine, Ser: 1.58 mg/dL — ABNORMAL HIGH (ref 0.44–1.00)
GFR, Estimated: 30 mL/min — ABNORMAL LOW (ref >60)
Glucose, Bld: 156 mg/dL — ABNORMAL HIGH (ref 70–99)
Potassium: 4.4 mmol/L (ref 3.5–5.1)
Sodium: 138 mmol/L (ref 135–145)

## 2023-04-01 LAB — URINALYSIS, W/ REFLEX TO CULTURE (INFECTION SUSPECTED)
Bacteria, UA: NONE SEEN
Bilirubin Urine: NEGATIVE
Glucose, UA: NEGATIVE mg/dL
Hgb urine dipstick: NEGATIVE
Ketones, ur: NEGATIVE mg/dL
Nitrite: NEGATIVE
Protein, ur: NEGATIVE mg/dL
Specific Gravity, Urine: 1.012 (ref 1.005–1.030)
pH: 6 (ref 5.0–8.0)

## 2023-04-01 LAB — MAGNESIUM: Magnesium: 2.4 mg/dL (ref 1.7–2.4)

## 2023-04-01 LAB — PHOSPHORUS: Phosphorus: 3 mg/dL (ref 2.5–4.6)

## 2023-04-01 NOTE — Progress Notes (Signed)
PT Cancellation Note  Patient Details Name: Tina Curtis MRN: [id number] [name]: 1926/06/04   Cancelled Treatment:     PT attempt. Pt wa sin BR working with OT. Will return at a later time.    Rushie Chestnut 04/01/2023, 8:04 AM

## 2023-04-01 NOTE — Progress Notes (Signed)
Physical Therapy Treatment Patient Details Name: Tina Curtis MRN: [id number] [name]: [birth year] Today's Date: 04/01/2023   History of Present Illness 89 y.o. female with medical history significant of hypertension, chronic headache, CVA on dual antiplatelet therapy, neuropathic pain, hyperlipidemia who presents from her ILF- [facility] after mechanical fall in her shower. Has R knee pain and CT head: possible trace subarachnoid hemorrhage in the left Sylvian fissure    PT Comments  Pt was side lying in bed upon arrival. She is frustrated with lck of communication about Dcing to facility but was agreeable to session and remains cooperative throughout. Pt was able to exit bed, stand, and ambulate with RW. Foot drop observed but was encouraged to discuss with her PT at [facility]. Pt remains at high risk of falls. Will continue to current POC progressing as able. Dc recs remain appropriate.    If plan is discharge home, recommend the following: A little help with bathing/dressing/bathroom;A little help with walking and/or transfers     Equipment Recommendations  Other (comment) (Defer to next level of care)       Precautions / Restrictions Precautions Precautions: Fall Restrictions Weight Bearing Restrictions: No     Mobility  Bed Mobility Overal bed mobility: Needs Assistance Bed Mobility: Supine to Sit  Supine to sit: Modified independent (Device/Increase time)   Transfers Overall transfer level: Needs assistance Equipment used: Rolling walker (2 wheels) Transfers: Sit to/from Stand Sit to Stand: Min assist   Ambulation/Gait Ambulation/Gait assistance: Contact guard assist, Min assist Gait Distance (Feet): 200 Feet Assistive device: Rolling walker (2 wheels) Gait Pattern/deviations: Step-through pattern, Narrow base of support, Staggering left, Staggering right Gait velocity: decreased  General Gait Details: CGA mostly with occasional min assist. discussed footrop on RLE and  encouraged pt to discuss with therapist when she returns to [facility]      Balance Overall balance assessment: Needs assistance Sitting-balance support: Feet supported, No upper extremity supported Sitting balance-Leahy Scale: Good     Standing balance support: During functional activity, Reliant on assistive device for balance, Bilateral upper extremity supported Standing balance-Leahy Scale: Poor Standing balance comment: pt has unsteadiness with turning       Cognition Arousal: Alert Behavior During Therapy: WFL for tasks assessed/performed Overall Cognitive Status: No family/caregiver present to determine baseline cognitive functioning    General Comments: STM deficits               Pertinent Vitals/Pain Pain Assessment Pain Assessment: No/denies pain     PT Goals (current goals can now be found in the care plan section) Acute Rehab PT Goals Patient Stated Goal: return to [facility] Progress towards PT goals: Progressing toward goals    Frequency    Min 1X/week       AM-PAC PT "6 Clicks" Mobility   Outcome Measure  Help needed turning from your back to your side while in a flat bed without using bedrails?: A Little Help needed moving from lying on your back to sitting on the side of a flat bed without using bedrails?: A Little Help needed moving to and from a bed to a chair (including a wheelchair)?: A Little Help needed standing up from a chair using your arms (e.g., wheelchair or bedside chair)?: A Little Help needed to walk in hospital room?: A Little Help needed climbing 3-5 steps with a railing? : A Lot 6 Click Score: 17    End of Session   Activity Tolerance: Patient tolerated treatment well;Patient limited by  fatigue Patient left: in chair;with bed alarm set;with chair alarm set;with family/visitor present Nurse Communication: Mobility status PT Visit Diagnosis: Unsteadiness on feet (R26.81)     Time: 1610-9604 PT Time Calculation (min)  (ACUTE ONLY): 24 min  Charges:    $Gait Training: 8-22 mins $Therapeutic Activity: 8-22 mins PT General Charges $$ ACUTE PT VISIT: 1 Visit                     Jetta Lout PTA 04/01/23, 4:34 PM

## 2023-04-01 NOTE — TOC Progression Note (Signed)
Transition of Care [facility]) - Progression Note    Patient Details  Name: Tina Curtis MRN: [id number] Date of Birth: 27-Apr-1927  Transition of Care [facility]) CM/SW Contact  Garret Reddish, RN Phone Number: 04/01/2023, 2:59 PM  Clinical Narrative:    Chart reviewed. I have received SNF authorization for patient.  Plan Berkley Harvey [id number] Auth ID- [id number] Approved for 04-01-23- 04-05-23.  Next review date is 04-05-23.  I have informed Sue Lush with [facility].  Debby Bud reports that she will have a bed for patient in on tomorrow.   I have informed Dr. Lucianne Muss and patient of the above information.         Expected Discharge Plan and Services                                               Social Determinants of Health (SDOH) Interventions SDOH Screenings   Food Insecurity: No Food Insecurity (03/29/2023)  Housing: Low Risk  (03/29/2023)  Transportation Needs: No Transportation Needs (03/29/2023)  Utilities: Not At Risk (03/29/2023)  Financial Resource Strain: Low Risk  (11/17/2022)   Received from [facility], [facility]  Tobacco Use: Low Risk  (03/29/2023)    Readmission Risk Interventions    06/08/2022    3:30 PM  Readmission Risk Prevention Plan  Transportation Screening Complete  Palliative Care Screening Not Applicable  Medication Review (RN Care Manager) Complete

## 2023-04-01 NOTE — Progress Notes (Signed)
[facility] Progress Note  Patient: Tina Curtis    WUJ:[id number]  [name]: 03/29/2023     Date of Service: the patient was seen and examined on 04/01/2023  Chief Complaint  Patient presents with   Fall   Knee Pain   Brief hospital course: 89 y.o. female with medical history significant of hypertension, chronic headache, CVA on dual antiplatelet therapy, neuropathic pain, hyperlipidemia who presents from her independent living facility [facility] after mechanical fall.  Patient states she was walking through her house and fell.  Denies loss of consciousness.  Does not recall why she fell.   On initial evaluation patient was found to have a trace subarachnoid hemorrhage.  EDP contacted neurosurgery who recommended repeat head CT within 6 hours.  This was completed and small bleed was stable.  Neurosurgery recommends conservative management and holding dual antiplatelet therapy x 1 week.   Patient will require skilled nursing facility placement so [facility] was contacted for admission.   Patient seen in consultation by PT and OT.  Recommendation for short-term rehab.  TOC aware and will reach out to [facility]   Assessment and Plan: Principal Problem:   Fall  Mechanical fall Trace subarachnoid hemorrhage Patient is alert and oriented x 4.  Head CT x 2 with 6 hours spacing is reassuring.  No expansion of bleed.  Neurosurgery contacted from ED.  Recommend conservative management. Plan: Continue frequent neurochecks Continue to hold aspirin and Plavix x 1 week.  Can resume Monday 11/25 Continue therapy, current recommendation for skilled nursing facility placement [facility] consult for placement Fall precautions   Essential hypertension Continue home regimen: Toprol-XL 25 mg daily Avapro 37.5 mg daily 11/20 Increased Hydralazine 50 mg 3 times daily 11/20 Started amlodipine 5 mg p.o. every evening if SBP greater than 140 mmHg. Monitor BP and titrate medication  accordingly   Hyperlipidemia PTA statin   Depression PTA Celexa   Chronic arthritic pain Neuropathic pain Restart home gabapentin Cautious narcotic regimen Voltaren gel   CKD stage IIIb Creatinine at or near baseline Avoid nonessential nephrotoxins Outpatient nephrology follow-up  C/o difficulty urination in the morning and urgency Possible UTI Follow UA and urine culture Follow bladder scan Creatinine slightly elevated could be due to urinary retention, RN was advised to encourage fluids, continue oral hydration  Body mass index is 23.78 kg/m.  Interventions:  Diet: Regular diet DVT Prophylaxis: SCD, pharmacological prophylaxis contraindicated due to ICH    Advance goals of care discussion: DNR/DNI-Limited  Family Communication: family was present at bedside, at the time of interview.  The pt provided permission to discuss medical plan with the family. Opportunity was given to ask question and all questions were answered satisfactorily.   Disposition:  Pt is from ILF, admitted with fall and intracranial hemorrhage, clinically stable, medically optimized and cleared by neurosurgery. PT/OT eval done, recommend SNF placement Ready to discharge, follow TOC for disposition plan to SNF   Subjective: No significant events overnight, early morning patient had urinary urgency and she could not urinate for first 15 minutes, denied any pain, no burning.  She was concerned about UTI.  No any other complaints, no chest pain or palpitation, no shortness of breath. Patient wanted to be tested for UTI, RN was advised to send urine sample and do bladder scan to rule out urinary retention.  Physical Exam: General: NAD, lying comfortably Appear in no distress, affect appropriate Eyes: PERRLA ENT: Oral Mucosa Clear, moist  Neck: no JVD,  Cardiovascular: S1 and  S2 Present, no Murmur,  Respiratory: good respiratory effort, Bilateral Air entry equal and Decreased, no Crackles, no  wheezes Abdomen: Bowel Sound present, Soft and no tenderness,  Skin: no rashes Extremities: no Pedal edema, no calf tenderness Neurologic: without any new focal findings Gait not checked due to patient safety concerns  Vitals:   03/31/23 1934 03/31/23 2152 04/01/23 0417 04/01/23 0827  BP: (!) 150/70 (!) 170/69 112/66 (!) 149/75  Pulse: 77 74 84 81  Resp: 18  16 18   Temp: 98.2 F (36.8 C)  97.9 F (36.6 C) 98.3 F (36.8 C)  TempSrc:      SpO2: 98%  97% 98%  Weight:      Height:       No intake or output data in the 24 hours ending 04/01/23 1621 Filed Weights   03/29/23 0742  Weight: 59 kg    Data Reviewed: I have personally reviewed and interpreted daily labs, tele strips, imagings as discussed above. I reviewed all nursing notes, pharmacy notes, vitals, pertinent old records I have discussed plan of care as described above with RN and patient/family.  CBC: Recent Labs  Lab 03/29/23 0744 03/30/23 0418 03/31/23 0854 04/01/23 0519  WBC 8.9 7.0 7.2 8.9  HGB 12.1 11.1* 12.5 12.1  HCT 36.8 32.5* 36.5 36.0  MCV 95.3 91.5 90.6 92.1  PLT 201 183 191 200   Basic Metabolic Panel: Recent Labs  Lab 03/29/23 0744 03/30/23 0418 03/31/23 0854 04/01/23 0519  NA 138 138 138 138  K 4.8 3.8 4.2 4.4  CL 103 106 108 107  CO2 25 21* 22 21*  GLUCOSE 166* 134* 158* 156*  BUN 34* 31* 30* 39*  CREATININE 1.29* 1.29* 1.42* 1.58*  CALCIUM 9.4 8.8* 8.7* 8.5*  MG  --   --  2.3 2.4  PHOS  --   --  2.8 3.0    Studies: No results found.  Scheduled Meds:  amLODipine  5 mg Oral QPM   anastrozole  1 mg Oral Daily   citalopram  10 mg Oral Daily   cyanocobalamin  1,000 mcg Oral Daily   diclofenac Sodium  4 g Topical QID   gabapentin  300 mg Oral BID   hydrALAZINE  50 mg Oral TID   irbesartan  37.5 mg Oral Daily   metoprolol succinate  25 mg Oral Daily   simvastatin  20 mg Oral QPM   triamcinolone cream   Topical BID   Continuous Infusions: PRN Meds: acetaminophen **OR**  acetaminophen, albuterol, hydrALAZINE, hydrOXYzine, melatonin, morphine injection, ondansetron **OR** ondansetron (ZOFRAN) IV, oxyCODONE, senna-docusate  Time spent: 35 minutes  Author: Gillis Santa. MD [facility] 04/01/2023 4:21 PM  To reach On-call, see care teams to locate the attending and reach out to them via [email or web address]. If 7PM-7AM, please contact night-coverage If you still have difficulty reaching the attending provider, please page the [facility] (Director on Call) for [facility] on amion for assistance.

## 2023-04-01 NOTE — Progress Notes (Signed)
Occupational Therapy Treatment Patient Details Name: Tina Curtis MRN: [id number] [name]: Jun 22, 1926 Today's Date: 04/01/2023   History of present illness 89 y.o. female with medical history significant of hypertension, chronic headache, CVA on dual antiplatelet therapy, neuropathic pain, hyperlipidemia who presents from her ILF- [facility] after mechanical fall in her shower. Has R knee pain and CT head: possible trace subarachnoid hemorrhage in the left Sylvian fissure   OT comments  Pt is supine in bed on arrival. Easily arousable and agreeable to OT session. She reports pain to bil knees and L groin region. Pt performed bed mobility with MOD I. She is requiring Min/Mod A for STS from EOB to RW with posterior lean noted. Pt required CGA/MIN A using RW for all mobility d/t occasional bouts of posterior lean with standing. Pt performed toileting transfer with CGA/MIN A using grab bar. CGA/Min A for balance while pt managed peri-care and mesh panties. Engaged in bathing seated on toilet and is SUP level with UB dressing and bathing. Required Min A for LB ADLs during session d/t LOB with leaning forward to reach BLEs and assist to get panties over her feet.  Pt returned to recliner with all needs in place and will cont to require skilled acute OT services to maximize her safety and IND to return to PLOF.       If plan is discharge home, recommend the following:  A little help with walking and/or transfers;A lot of help with bathing/dressing/bathroom;Assistance with cooking/housework;Help with stairs or ramp for entrance;Assist for transportation;Direct supervision/assist for medications management;Supervision due to cognitive status;Direct supervision/assist for financial management   Equipment Recommendations  BSC/3in1    Recommendations for Other Services      Precautions / Restrictions Restrictions Weight Bearing Restrictions: No       Mobility Bed Mobility Overal bed mobility: Needs  Assistance Bed Mobility: Supine to Sit     Supine to sit: Modified independent (Device/Increase time)     General bed mobility comments: Mod I for supine to sit at EOB at scooting to EOB    Transfers Overall transfer level: Needs assistance Equipment used: Rolling walker (2 wheels)   Sit to Stand: Min assist, Mod assist           General transfer comment: Min/Mod A from low bed surface to RW and Min A/CGA from commode     Balance Overall balance assessment: Needs assistance Sitting-balance support: Feet supported, No upper extremity supported Sitting balance-Leahy Scale: Good   Postural control: Posterior lean Standing balance support: During functional activity, Reliant on assistive device for balance, Bilateral upper extremity supported Standing balance-Leahy Scale: Poor Standing balance comment: posterior LOB multiple times during static and dynamic standing                           ADL either performed or assessed with clinical judgement   ADL Overall ADL's : Needs assistance/impaired     Grooming: Wash/dry face;Sitting   Upper Body Bathing: Sitting;Supervision/ safety   Lower Body Bathing: Contact guard assist;Minimal assistance Lower Body Bathing Details (indicate cue type and reason): Min/CGA for LB bathing in standing to bathe peri-area/buttocks d/t balance deficits Upper Body Dressing : Supervision/safety   Lower Body Dressing: Moderate assistance Lower Body Dressing Details (indicate cue type and reason): needs assits to get mesh panties over bil feet Toilet Transfer: Minimal assistance;Contact guard assist;Grab bars;Comfort height toilet;BSC/3in1;Ambulation;Rolling walker (2 wheels) Toilet Transfer Details (indicate cue type and reason): BSC  placed over toilet d/t low height Toileting- Clothing Manipulation and Hygiene: Contact guard assist;Sit to/from stand;Sitting/lateral lean Toileting - Clothing Manipulation Details (indicate cue type and  reason): CGA to maintain balance     Functional mobility during ADLs: Contact guard assist;Rolling walker (2 wheels);Minimal assistance General ADL Comments: Min/CGA for balance d/t posterior lean    Extremity/Trunk Assessment Upper Extremity Assessment Upper Extremity Assessment: Overall WFL for tasks assessed   Lower Extremity Assessment Lower Extremity Assessment: Generalized weakness        Vision       Perception     Praxis      Cognition Arousal: Alert Behavior During Therapy: WFL for tasks assessed/performed Overall Cognitive Status: No family/caregiver present to determine baseline cognitive functioning                                 General Comments: STM deficits        Exercises Other Exercises Other Exercises: Edu on safety strategies, e.g. performing tasks in seated position and use of AE/AD including reacher for LB ADLs.    Shoulder Instructions       General Comments      Pertinent Vitals/ Pain       Pain Assessment Pain Assessment: Faces Faces Pain Scale: Hurts little more Pain Location: bil knees Pain Descriptors / Indicators: Aching, Sore Pain Intervention(s): Limited activity within patient's tolerance, Monitored during session  Home Living                                          Prior Functioning/Environment              Frequency  Min 1X/week        Progress Toward Goals  OT Goals(current goals can now be found in the care plan section)  Progress towards OT goals: Progressing toward goals  Acute Rehab OT Goals Patient Stated Goal: improve strength and balance OT Goal Formulation: With patient Time For Goal Achievement: 04/13/23 Potential to Achieve Goals: Good  Plan      Co-evaluation                 AM-PAC OT "6 Clicks" Daily Activity     Outcome Measure   Help from another person eating meals?: None Help from another person taking care of personal grooming?: None Help  from another person toileting, which includes using toliet, bedpan, or urinal?: A Little Help from another person bathing (including washing, rinsing, drying)?: A Lot Help from another person to put on and taking off regular upper body clothing?: None Help from another person to put on and taking off regular lower body clothing?: A Lot 6 Click Score: 19    End of Session Equipment Utilized During Treatment: Rolling walker (2 wheels)  OT Visit Diagnosis: Other abnormalities of gait and mobility (R26.89);Unsteadiness on feet (R26.81);History of falling (Z91.81);Pain Pain - Right/Left: Right Pain - part of body: Knee   Activity Tolerance Patient tolerated treatment well   Patient Left in chair;with call bell/phone within reach;with chair alarm set   Nurse Communication Mobility status        Time: 3016-0109 OT Time Calculation (min): 38 min  Charges: OT General Charges $OT Visit: 1 Visit OT Treatments $Self Care/Home Management : 38-52 mins  Soua Lenk, OTR/L  04/01/23, 9:39 AM  Constance Goltz 04/01/2023, 9:36 AM

## 2023-04-01 NOTE — TOC Progression Note (Signed)
Transition of Care [facility]) - Progression Note    Patient Details  Name: Tina Curtis MRN: [id number] Date of Birth: 12/19/1926  [facility]) CM/SW Contact  Garret Reddish, RN Phone Number: 04/01/2023, 9:26 AM  Clinical Narrative:    SNF authorization still pending. Will continue to follow.          Expected Discharge Plan and Services                                               Social Determinants of Health (SDOH) Interventions SDOH Screenings   Food Insecurity: No Food Insecurity (03/29/2023)  Housing: Low Risk  (03/29/2023)  Transportation Needs: No Transportation Needs (03/29/2023)  Utilities: Not At Risk (03/29/2023)  Financial Resource Strain: Low Risk  (11/17/2022)   Received from [facility], [facility]  Tobacco Use: Low Risk  (03/29/2023)    Readmission Risk Interventions    06/08/2022    3:30 PM  Readmission Risk Prevention Plan  Transportation Screening Complete  Palliative Care Screening Not Applicable  Medication Review (RN Care Manager) Complete

## 2023-04-02 DIAGNOSIS — I609 Nontraumatic subarachnoid hemorrhage, unspecified: Secondary | ICD-10-CM | POA: Diagnosis not present

## 2023-04-02 DIAGNOSIS — W19XXXA Unspecified fall, initial encounter: Secondary | ICD-10-CM | POA: Diagnosis not present

## 2023-04-02 MED ORDER — AMLODIPINE BESYLATE 5 MG PO TABS: 5.0000 mg | ORAL_TABLET | Freq: Every evening | ORAL | Status: DC

## 2023-04-02 MED ORDER — ASPIRIN 81 MG PO TBEC: 81.0000 mg | DELAYED_RELEASE_TABLET | Freq: Every day | ORAL | Status: DC

## 2023-04-02 MED ORDER — CLOPIDOGREL BISULFATE 75 MG PO TABS: 75.0000 mg | ORAL_TABLET | Freq: Every day | ORAL | Status: DC

## 2023-04-02 MED ORDER — HYDRALAZINE HCL 50 MG PO TABS: 50.0000 mg | ORAL_TABLET | Freq: Three times a day (TID) | ORAL | Status: DC

## 2023-04-02 NOTE — Progress Notes (Signed)
Mobility Specialist - Progress Note     04/02/23 1148  Mobility  Activity Ambulated with assistance in hallway;Stood at bedside  Level of Assistance Modified independent, requires aide device or extra time  Assistive Device Front wheel walker  Distance Ambulated (ft) 200 ft  Range of Motion/Exercises Active  Activity Response Tolerated well  Mobility Referral Yes  $Mobility charge 1 Mobility   Pt resting in recliner on RA upon entry. Pt STS and ambulates to hallway around NS after IV removal by RN. Pt endorses pain in right knee and left foot. Pt maintained upright position and had no LOB during session. Pt returned to recliner and left with needs in reach. NT present at bedside.   Johnathan Hausen Mobility Specialist 04/02/23, 12:15 PM

## 2023-04-02 NOTE — Discharge Summary (Signed)
[facility] Discharge Summary   Patient: Tina Curtis MWU:[id number]  PCP: [name], MD  Date of admission: 03/29/2023   Date of discharge:  04/02/2023     Discharge Diagnoses:  Principal Problem:   Fall   Admitted From: ILF Disposition:  SNF   Recommendations for Outpatient Follow-up:  Follow-up with PCP, patient should be seen by an MD in 1 to 2 days, continue to monitor BP and titrate medications accordingly, started amlodipine and increase hydralazine.  Follow parameters. Repeat BMP next week to check renal functions, continue oral hydration.  Creatinine slightly elevated could be due to dehydration. Hold aspirin and Plavix for total 1 week as per neurosurgery, resume on 04/05/2023. Follow up LABS/TEST:  BMP next week    Diet recommendation: Cardiac diet  Activity: The patient is advised to gradually reintroduce usual activities, as tolerated  Discharge Condition: stable  Code Status: DNR /DNI limited  History of present illness: As per the H and P dictated on admission Hospital Course:  89 y.o. female with medical history significant of hypertension, chronic headache, CVA on dual antiplatelet therapy, neuropathic pain, hyperlipidemia who presents from her independent living facility [facility] after mechanical fall.  Patient states she was walking through her house and fell.  Denies loss of consciousness.  Does not recall why she fell.   On initial evaluation patient was found to have a trace subarachnoid hemorrhage.  EDP contacted neurosurgery who recommended repeat head CT within 6 hours.  This was completed and small bleed was stable.  Neurosurgery recommends conservative management and holding dual antiplatelet therapy x 1 week.   Patient will require skilled nursing facility placement so [facility] was contacted for admission.   Patient seen in consultation by PT and OT.  Recommendation for short-term rehab.  TOC consulted, patient got accepted at [facility].   Clinically stable, medically optimized to discharge today.  Patient agreed with the discharge planning.   Assessment and Plan: Principal Problem:   Fall  # Trace subarachnoid hemorrhage s/p Mechanical fall Patient is alert and oriented x 4.  Head CT x 2 with 6 hours spacing is reassuring.  No expansion of bleed.  Neurosurgery contacted from ED.  Recommend conservative management. Continue to hold aspirin and Plavix x 1 week.  Can resume on Monday 11/25 Continue fall precautions.  Seen by PT and OT, recommend SNF placement.  Patient is stable to discharge today.   # Essential hypertension: Continue Toprol-XL 25 mg daily, valsartan 80 mg p.o. daily On 11/20 Increased Hydralazine 50 mg 3 times daily and Started amlodipine 5 mg p.o. every evening if SBP greater than 140 mmHg. Monitor BP and titrate medication accordingly # Hyperlipidemia: Continue simvastatin 20 mg p.o. to home dose # Depression: Resumed home dose Celexa and Cymbalta # Chronic arthritic pain and Neuropathic pain: Restart home gabapentin, continue Tylenol as needed for pain control and may use Voltaren gel topical for knee pain # CKD stage IIIb, elevated BUN and creatinine most likely due to dehydration.  Patient was encouraged to continue oral hydration and repeat BMP next week to check renal functions. UA negative for UTI.  Bladder scan did not show any urinary retention.    Body mass index is 23.78 kg/m.  Nutrition Interventions:  Patient was seen by physical therapy, who recommended Therapy, SNF placement, which was arranged. On the day of the discharge the patient's vitals were stable, and no other acute medical condition were reported by patient. the patient was felt safe  to be discharge at [facility].  Consultants: Neurosurgery Procedures: None  Discharge Exam: General: Appear in no distress, no Rash; Oral Mucosa Clear, moist. Cardiovascular: S1 and S2 Present, no Murmur, Respiratory: normal respiratory effort, Bilateral  Air entry present and no Crackles, no wheezes Abdomen: Bowel Sound present, Soft and no tenderness, no hernia Extremities: no Pedal edema, no calf tenderness Neurology: CN grossly intact, no focal deficit.   affect appropriate.  Filed Weights   03/29/23 0742  Weight: 59 kg   Vitals:   04/02/23 0425 04/02/23 0813  BP: 135/64 138/65  Pulse: 74 79  Resp: 16 18  Temp: (!) 97.5 F (36.4 C) 97.6 F (36.4 C)  SpO2: 95% 93%    DISCHARGE MEDICATION: Allergies as of 04/02/2023       Reactions   Penicillins Anaphylaxis   Drug Ingredient [zinc]    Empagliflozin Rash        Medication List     STOP taking these medications    pantoprazole 40 MG tablet Commonly known as: PROTONIX       TAKE these medications    amLODipine 5 MG tablet Commonly known as: NORVASC Take 1 tablet (5 mg total) by mouth every evening. Skip the dose if SBP less than 140 mmHg   anastrozole 1 MG tablet Commonly known as: ARIMIDEX Take 1 tablet (1 mg total) by mouth daily.   aspirin EC 81 MG tablet Take 1 tablet (81 mg total) by mouth daily. Start taking on: April 05, 2023 What changed: These instructions start on April 05, 2023. If you are unsure what to do until then, ask your doctor or other care provider.   calcitRIOL 0.25 MCG capsule Commonly known as: ROCALTROL Take 0.25 mcg by mouth every other day. Monday,Wednesday, Friday   calcium carbonate 1250 (500 Ca) MG tablet Commonly known as: OS-CAL - dosed in mg of elemental calcium Take 2 tablets by mouth 2 (two) times daily with a meal.   citalopram 10 MG tablet Commonly known as: CELEXA Take 10 mg by mouth daily.   clopidogrel 75 MG tablet Commonly known as: PLAVIX Take 1 tablet (75 mg total) by mouth daily. Start taking on: April 05, 2023 What changed: These instructions start on April 05, 2023. If you are unsure what to do until then, ask your doctor or other care provider.   cyanocobalamin 1000 MCG tablet Commonly  known as: VITAMIN B12 Take 1,000 mcg by mouth daily.   docusate sodium 100 MG capsule Commonly known as: COLACE Take 100 mg by mouth daily as needed for mild constipation.   DULoxetine 60 MG capsule Commonly known as: CYMBALTA Take 100 mg by mouth daily.   DULoxetine 30 MG capsule Commonly known as: CYMBALTA Take 30 mg by mouth daily.   gabapentin 300 MG capsule Commonly known as: NEURONTIN Take 300 mg by mouth 2 (two) times daily.   hydrALAZINE 50 MG tablet Commonly known as: APRESOLINE Take 1 tablet (50 mg total) by mouth 3 (three) times daily. Skip the dose if SBP less than 130 mmHg What changed:  medication strength how much to take additional instructions   loratadine 10 MG tablet Commonly known as: CLARITIN Take 10 mg by mouth daily as needed for allergies. AM   metoprolol succinate 25 MG 24 hr tablet Commonly known as: TOPROL-XL Take 25 mg by mouth at bedtime.   simvastatin 20 MG tablet Commonly known as: ZOCOR Take 20 mg by mouth every evening. PM   triamcinolone cream 0.1 % Commonly  known as: KENALOG Apply 1 Application topically 2 (two) times daily.   valsartan 80 MG tablet Commonly known as: DIOVAN Take 80 mg by mouth daily.   Vitamin D (Ergocalciferol) 1.25 MG (50000 UNIT) Caps capsule Commonly known as: DRISDOL Take 50,000 Units by mouth every 7 (seven) days.       Allergies  Allergen Reactions   Penicillins Anaphylaxis   Drug Ingredient [Zinc]    Empagliflozin Rash   Discharge Instructions     Call MD for:  difficulty breathing, headache or visual disturbances   Complete by: As directed    Call MD for:  extreme fatigue   Complete by: As directed    Call MD for:  persistant dizziness or light-headedness   Complete by: As directed    Call MD for:  persistant nausea and vomiting   Complete by: As directed    Call MD for:  severe uncontrolled pain   Complete by: As directed    Call MD for:  temperature >100.4   Complete by: As  directed    Diet - low sodium heart healthy   Complete by: As directed    Discharge instructions   Complete by: As directed    Follow-up with PCP, patient should be seen by an MD in 1 to 2 days, continue to monitor BP and titrate medications accordingly, started amlodipine and increase hydralazine.  Follow parameters. Repeat BMP next week to check renal functions, continue oral hydration.  Creatinine slightly elevated could be due to dehydration. Hold aspirin and Plavix for total 1 week as per neurosurgery, resume on 04/05/2023.   Increase activity slowly   Complete by: As directed        The results of significant diagnostics from this hospitalization (including imaging, microbiology, ancillary and laboratory) are listed below for reference.    Significant Diagnostic Studies: DG Hip Unilat W or Wo Pelvis 2-3 Views Right  Result Date: 03/29/2023 CLINICAL DATA:  Recent fall with right hip pain, initial encounter EXAM: DG HIP (WITH OR WITHOUT PELVIS) 3V RIGHT COMPARISON:  None Available. FINDINGS: Pelvic ring is intact. No acute fracture or dislocation is noted. No soft tissue abnormality is seen. IMPRESSION: No acute abnormality noted. Electronically Signed   By: Alcide Clever M.D.   On: 03/29/2023 20:38   CT Head Wo Contrast  Result Date: 03/29/2023 CLINICAL DATA:  Head trauma, intracranial arterial injury suspected f/u [facility] EXAM: CT HEAD WITHOUT CONTRAST TECHNIQUE: Contiguous axial images were obtained from the base of the skull through the vertex without intravenous contrast. RADIATION DOSE REDUCTION: This exam was performed according to the departmental dose-optimization program which includes automated exposure control, adjustment of the mA and/or kV according to patient size and/or use of iterative reconstruction technique. COMPARISON:  Same day CT head FINDINGS: Brain: No hydrocephalus. No extra-axial fluid collection. No mastoid. No mass lesion. Unchanged possible trace subarachnoid  hemorrhage in the left sylvian fissure. No CT evidence of acute cortical infarct. There is sequela mild chronic microvascular ischemic change. Vascular: No hyperdense vessel or unexpected calcification. Skull: Soft tissue swelling along the right lateral scalp. No evidence of an calvarial fracture. Sinuses/Orbits: Trace right mastoid effusion. No middle ear effusion. Paranasal sinuses are clear. Bilateral lens replacement. Orbits are otherwise unremarkable. Other: None. IMPRESSION: 1. Unchanged possible trace subarachnoid hemorrhage in the left Sylvian fissure. 2. Soft tissue swelling along the right lateral scalp. No evidence of an calvarial fracture. Electronically Signed   By: Lorenza Cambridge M.D.   On: 03/29/2023 16:59  CT Chest Wo Contrast  Result Date: 03/29/2023 CLINICAL DATA:  Unwitnessed fall.  Chest trauma. EXAM: CT CHEST WITHOUT CONTRAST TECHNIQUE: Multidetector CT imaging of the chest was performed following the standard protocol without IV contrast. RADIATION DOSE REDUCTION: This exam was performed according to the departmental dose-optimization program which includes automated exposure control, adjustment of the mA and/or kV according to patient size and/or use of iterative reconstruction technique. COMPARISON:  CTA chest dated 08/22/2022 FINDINGS: Cardiovascular: Normal heart size. No significant pericardial fluid/thickening. Great vessels are normal in course and caliber. Coronary artery calcifications and aortic atherosclerosis. Mediastinum/Nodes: Imaged thyroid gland without nodules meeting criteria for imaging follow-up by size. Normal esophagus. No pathologically enlarged axillary, supraclavicular, mediastinal, or hilar lymph nodes. Lungs/Pleura: The central airways are patent. Similar middle lobe, lingular, and bilateral lower lobe scarring and traction bronchiectasis. No focal consolidation. No pneumothorax. No pleural effusion. Upper abdomen: Multifocal pancreatic hypodensities, for  example 2.4 cm hypoattenuating focus arising from the pancreatic neck (4:126), better evaluated on prior CT abdomen examinations. Atrophic left kidney. Musculoskeletal: No acute or abnormal lytic or blastic osseous lesions. Please see separately dictated CT thoracic spine report for detailed findings. Multilevel degenerative changes of the thoracic spine. Right axillary surgical clips. IMPRESSION: 1. No acute or traumatic intrathoracic abnormality. 2. Similar middle lobe, lingular, and bilateral lower lobe scarring and traction bronchiectasis. 3. Multifocal pancreatic hypodensities, better evaluated on prior CT abdomen examinations. 4. Aortic Atherosclerosis (ICD10-I70.0). Coronary artery calcifications. Assessment for potential risk factor modification, dietary therapy or pharmacologic therapy may be warranted, if clinically indicated. Electronically Signed   By: Agustin Cree M.D.   On: 03/29/2023 10:10   CT Cervical Spine Wo Contrast  Result Date: 03/29/2023 CLINICAL DATA:  Neck trauma (Age >= 65y). Fall with neck and back pain. EXAM: CT CERVICAL SPINE WITHOUT CONTRAST CT THORACIC SPINE WITHOUT CONTRAST TECHNIQUE: Multidetector CT imaging of the cervical and thoracic spine was performed without contrast. Multiplanar CT image reconstructions were also generated. RADIATION DOSE REDUCTION: This exam was performed according to the departmental dose-optimization program which includes automated exposure control, adjustment of the mA and/or kV according to patient size and/or use of iterative reconstruction technique. COMPARISON:  CT cervical spine 06/05/2022. FINDINGS: CT CERVICAL SPINE FINDINGS Alignment: Unchanged trace retrolisthesis of C3 on C4 and 3 mm anterolisthesis of C7 on T1. No traumatic malalignment. Skull base and vertebrae: No acute fracture. Normal craniocervical junction. No suspicious bone lesions. Soft tissues and spinal canal: No prevertebral fluid or swelling. No visible canal hematoma. Disc  levels: Multilevel cervical spondylosis, worst at C3-4, where there is at least mild spinal canal stenosis. Upper chest: No acute findings. Other: Atherosclerotic calcifications of the carotid bulbs. CT THORACIC SPINE FINDINGS Alignment: Exaggerated kyphosis of the upper thoracic spine. Vertebrae: Diffuse idiopathic skeletal hyperostosis. No acute fracture or suspicious bone lesion. Paraspinal and other soft tissues: Please refer to same-day chest CT report. Disc levels: No disc herniation, spinal canal stenosis or neural foraminal narrowing. IMPRESSION: 1. No acute fracture or traumatic malalignment of the cervical or thoracic spine. 2. Multilevel cervical spondylosis, worst at C3-4, where there is at least mild spinal canal stenosis. Electronically Signed   By: Orvan Falconer M.D.   On: 03/29/2023 10:08   CT T-SPINE NO CHARGE  Result Date: 03/29/2023 CLINICAL DATA:  Neck trauma (Age >= 65y). Fall with neck and back pain. EXAM: CT CERVICAL SPINE WITHOUT CONTRAST CT THORACIC SPINE WITHOUT CONTRAST TECHNIQUE: Multidetector CT imaging of the cervical and thoracic spine was performed without  contrast. Multiplanar CT image reconstructions were also generated. RADIATION DOSE REDUCTION: This exam was performed according to the departmental dose-optimization program which includes automated exposure control, adjustment of the mA and/or kV according to patient size and/or use of iterative reconstruction technique. COMPARISON:  CT cervical spine 06/05/2022. FINDINGS: CT CERVICAL SPINE FINDINGS Alignment: Unchanged trace retrolisthesis of C3 on C4 and 3 mm anterolisthesis of C7 on T1. No traumatic malalignment. Skull base and vertebrae: No acute fracture. Normal craniocervical junction. No suspicious bone lesions. Soft tissues and spinal canal: No prevertebral fluid or swelling. No visible canal hematoma. Disc levels: Multilevel cervical spondylosis, worst at C3-4, where there is at least mild spinal canal stenosis.  Upper chest: No acute findings. Other: Atherosclerotic calcifications of the carotid bulbs. CT THORACIC SPINE FINDINGS Alignment: Exaggerated kyphosis of the upper thoracic spine. Vertebrae: Diffuse idiopathic skeletal hyperostosis. No acute fracture or suspicious bone lesion. Paraspinal and other soft tissues: Please refer to same-day chest CT report. Disc levels: No disc herniation, spinal canal stenosis or neural foraminal narrowing. IMPRESSION: 1. No acute fracture or traumatic malalignment of the cervical or thoracic spine. 2. Multilevel cervical spondylosis, worst at C3-4, where there is at least mild spinal canal stenosis. Electronically Signed   By: Orvan Falconer M.D.   On: 03/29/2023 10:08   DG Knee Complete 4 Views Right  Result Date: 03/29/2023 CLINICAL DATA:  Fall.  Right knee pain. EXAM: RIGHT KNEE - COMPLETE 4+ VIEW COMPARISON:  None Available. FINDINGS: There is diffuse osteopenia of the visualized osseous structures. No acute fracture or dislocation. No aggressive osseous lesion. There are degenerative changes of the knee joint in the form of mildly reduced medial tibio-femoral compartment joint space, tibial spiking and osteophytosis. Note is also made of meniscal chondrocalcinosis. No knee effusion or focal soft tissue swelling. No radiopaque foreign bodies. IMPRESSION: *No acute osseous abnormality of the right knee joint. *Mild degenerative osteoarthritis. Electronically Signed   By: Jules Schick M.D.   On: 03/29/2023 08:59   CT HEAD WO CONTRAST  Result Date: 03/29/2023 CLINICAL DATA:  Head trauma, minor EXAM: CT HEAD WITHOUT CONTRAST TECHNIQUE: Contiguous axial images were obtained from the base of the skull through the vertex without intravenous contrast. RADIATION DOSE REDUCTION: This exam was performed according to the departmental dose-optimization program which includes automated exposure control, adjustment of the mA and/or kV according to patient size and/or use of iterative  reconstruction technique. COMPARISON:  08/22/2022 FINDINGS: Brain: Small high-density area at the lower left sylvian fissure, more globular than expected for hyperdense MCA, although contiguous with the MCA. No reported MCA syndrome. No evidence of acute infarct, hydrocephalus, mass, or collection. Vascular: Atheromatous calcification Skull: Right parietal scalp swelling without underlying fracture. Sinuses/Orbits: No evidence of injury Critical Value/emergent results were called by telephone at the time of interpretation on 03/29/2023 at 8:24 am to provider Jene Every , who verbally acknowledged these results. IMPRESSION: Tiny subarachnoid hemorrhage at the lower left sylvian fissure, likely traumatic, see reformats. Right parietal scalp hematoma without calvarial fracture. Electronically Signed   By: Tiburcio Pea M.D.   On: 03/29/2023 08:30    Microbiology: No results found for this or any previous visit (from the past 240 hour(s)).   Labs: CBC: Recent Labs  Lab 03/29/23 0744 03/30/23 0418 03/31/23 0854 04/01/23 0519  WBC 8.9 7.0 7.2 8.9  HGB 12.1 11.1* 12.5 12.1  HCT 36.8 32.5* 36.5 36.0  MCV 95.3 91.5 90.6 92.1  PLT 201 183 191 200   Basic Metabolic  Panel: Recent Labs  Lab 03/29/23 0744 03/30/23 0418 03/31/23 0854 04/01/23 0519  NA 138 138 138 138  K 4.8 3.8 4.2 4.4  CL 103 106 108 107  CO2 25 21* 22 21*  GLUCOSE 166* 134* 158* 156*  BUN 34* 31* 30* 39*  CREATININE 1.29* 1.29* 1.42* 1.58*  CALCIUM 9.4 8.8* 8.7* 8.5*  MG  --   --  2.3 2.4  PHOS  --   --  2.8 3.0   Liver Function Tests: No results for input(s): "AST", "ALT", "ALKPHOS", "BILITOT", "PROT", "ALBUMIN" in the last 168 hours. No results for input(s): "LIPASE", "AMYLASE" in the last 168 hours. No results for input(s): "AMMONIA" in the last 168 hours. Cardiac Enzymes: No results for input(s): "CKTOTAL", "CKMB", "CKMBINDEX", "TROPONINI" in the last 168 hours. BNP (last 3 results) Recent Labs     08/26/22 0526 08/27/22 0742  BNP 621.0* 1,712.6*   CBG: Recent Labs  Lab 03/29/23 1041 03/29/23 1851  GLUCAP 137* 122*    Time spent: 35 minutes  Signed:  Gillis Santa  [facility] 04/02/2023 11:07 AM

## 2023-04-02 NOTE — Progress Notes (Signed)
Physical Therapy Treatment Patient Details Name: Tina Curtis MRN: [id number] [name]: February 20, 1927 Today's Date: 04/02/2023   History of Present Illness 89 y.o. female with medical history significant of hypertension, chronic headache, CVA on dual antiplatelet therapy, neuropathic pain, hyperlipidemia who presents from her ILF- [facility] after mechanical fall in her shower. Has R knee pain and CT head: possible trace subarachnoid hemorrhage in the left Sylvian fissure    PT Comments  Pt was sitting in recliner upon arrival. She is alert and remains pleasant and cooperative. She was able to stand and ambulate with RW > 200 ft. Pt however does remain high fall risk and not at her baseline when it comes to mobility, transfers, and gait safety. Highly recommend continued skilled PT at DC to maximize independence and safety with all ADLs. DC recs remain appropriate.    If plan is discharge home, recommend the following: A little help with bathing/dressing/bathroom;A little help with walking and/or transfers     Equipment Recommendations  Other (comment) (Defer o next level of care)       Precautions / Restrictions Precautions Precautions: Fall Restrictions Weight Bearing Restrictions: No     Mobility  Bed Mobility  General bed mobility comments: In recliner pre/post    Transfers Overall transfer level: Needs assistance Equipment used: Rolling walker (2 wheels) Transfers: Sit to/from Stand Sit to Stand: Contact guard assist, Min assist   Ambulation/Gait Ambulation/Gait assistance: Contact guard assist, Min assist Gait Distance (Feet): 250 Feet Assistive device: Rolling walker (2 wheels) Gait Pattern/deviations: Step-through pattern, Narrow base of support, Staggering left, Staggering right Gait velocity: decreased  General Gait Details: CGA mostly with occasional min assist with turning/ unsteadiness    Balance Overall balance assessment: Needs assistance Sitting-balance support:  Feet supported, No upper extremity supported Sitting balance-Leahy Scale: Good     Standing balance support: During functional activity, Reliant on assistive device for balance, Bilateral upper extremity supported Standing balance-Leahy Scale: Fair Standing balance comment: Pt remains a high fall risk due to LLE foot drop concerns and unsteadiness during standing activity       Cognition Arousal: Alert Behavior During Therapy: WFL for tasks assessed/performed Overall Cognitive Status: No family/caregiver present to determine baseline cognitive functioning      General Comments: Pt is alert, pleasant, and motivated               Pertinent Vitals/Pain Pain Assessment Pain Assessment: No/denies pain Pain Location: bil knees Pain Descriptors / Indicators: Aching, Sore Pain Intervention(s): Limited activity within patient's tolerance, Monitored during session, Repositioned     PT Goals (current goals can now be found in the care plan section) Acute Rehab PT Goals Patient Stated Goal: return to [facility] Progress towards PT goals: Progressing toward goals    Frequency    Min 1X/week       AM-PAC PT "6 Clicks" Mobility   Outcome Measure  Help needed turning from your back to your side while in a flat bed without using bedrails?: A Little Help needed moving from lying on your back to sitting on the side of a flat bed without using bedrails?: A Little Help needed moving to and from a bed to a chair (including a wheelchair)?: A Little Help needed standing up from a chair using your arms (e.g., wheelchair or bedside chair)?: A Little Help needed to walk in hospital room?: A Little Help needed climbing 3-5 steps with a railing? : A Lot 6 Click Score: 17    End  of Session   Activity Tolerance: Patient tolerated treatment well Patient left: in chair;with bed alarm set;with chair alarm set;with family/visitor present Nurse Communication: Mobility status PT Visit  Diagnosis: Unsteadiness on feet (R26.81)     Time: 1610-9604 PT Time Calculation (min) (ACUTE ONLY): 24 min  Charges:    $Gait Training: 8-22 mins $Therapeutic Activity: 8-22 mins PT General Charges $$ ACUTE PT VISIT: 1 Visit                    Jetta Lout PTA 04/02/23, 10:12 AM

## 2023-04-02 NOTE — TOC Transition Note (Addendum)
Transition of Care [facility]) - CM/SW Discharge Note   Patient Details  Name: Tina Curtis MRN: [id number] Date of Birth: 1927-05-08  Transition of Care [facility]) CM/SW Contact:  Hetty Ely, RN Phone Number: 04/02/2023, 11:26 AM    Final next level of care: Skilled Nursing Facility Barriers to Discharge: Barriers Resolved   Patient Goals and CMS Choice   Patient to discharge to [facility]. Room 101.  Nurse to call report to [phone].                    Patient chooses bed at: [facility] Patient to be transferred to facility by: [facility] Name of family member notified: Daughter, [name] Patient and family notified of of transfer: 04/02/23  Discharge Plan and Services Additional resources added to the After Visit Summary for                  DME Arranged: N/A DME Agency: NA       HH Arranged: NA HH Agency: NA        Social Determinants of Health (SDOH) Interventions SDOH Screenings   Food Insecurity: No Food Insecurity (03/29/2023)  Housing: Low Risk  (03/29/2023)  Transportation Needs: No Transportation Needs (03/29/2023)  Utilities: Not At Risk (03/29/2023)  Financial Resource Strain: Low Risk  (11/17/2022)   Received from [facility], [facility]  Tobacco Use: Low Risk  (03/29/2023)     Readmission Risk Interventions    06/08/2022    3:30 PM  Readmission Risk Prevention Plan  Transportation Screening Complete  Palliative Care Screening Not Applicable  Medication Review (RN Care Manager) Complete

## 2023-04-02 NOTE — Plan of Care (Signed)
  Problem: Education: Goal: Knowledge of General Education information will improve Description: Including pain rating scale, medication(s)/side effects and non-pharmacologic comfort measures Outcome: Progressing   Problem: Activity: Goal: Risk for activity intolerance will decrease Outcome: Progressing   Problem: Nutrition: Goal: Adequate nutrition will be maintained Outcome: Progressing   Problem: Coping: Goal: Level of anxiety will decrease Outcome: Progressing   Problem: Elimination: Goal: Will not experience complications related to bowel motility Outcome: Progressing   Problem: Pain Management: Goal: General experience of comfort will improve Outcome: Progressing   Problem: Safety: Goal: Ability to remain free from injury will improve Outcome: Progressing   Problem: Skin Integrity: Goal: Risk for impaired skin integrity will decrease Outcome: Progressing

## 2023-04-05 ENCOUNTER — Non-Acute Institutional Stay (SKILLED_NURSING_FACILITY): Payer: Medicare PPO | Admitting: Student

## 2023-04-05 ENCOUNTER — Encounter: Payer: Self-pay | Admitting: Student

## 2023-04-05 DIAGNOSIS — E1122 Type 2 diabetes mellitus with diabetic chronic kidney disease: Secondary | ICD-10-CM

## 2023-04-05 DIAGNOSIS — I609 Nontraumatic subarachnoid hemorrhage, unspecified: Secondary | ICD-10-CM

## 2023-04-05 DIAGNOSIS — I10 Essential (primary) hypertension: Secondary | ICD-10-CM

## 2023-04-05 DIAGNOSIS — M19071 Primary osteoarthritis, right ankle and foot: Secondary | ICD-10-CM

## 2023-04-05 DIAGNOSIS — M19072 Primary osteoarthritis, left ankle and foot: Secondary | ICD-10-CM

## 2023-04-05 DIAGNOSIS — I252 Old myocardial infarction: Secondary | ICD-10-CM

## 2023-04-05 DIAGNOSIS — I5032 Chronic diastolic (congestive) heart failure: Secondary | ICD-10-CM

## 2023-04-05 DIAGNOSIS — N1832 Type 2 diabetes mellitus with diabetic chronic kidney disease: Secondary | ICD-10-CM

## 2023-04-05 DIAGNOSIS — M316 Other giant cell arteritis: Secondary | ICD-10-CM | POA: Diagnosis not present

## 2023-04-05 DIAGNOSIS — G629 Polyneuropathy, unspecified: Secondary | ICD-10-CM

## 2023-04-05 DIAGNOSIS — C7951 Secondary malignant neoplasm of bone: Secondary | ICD-10-CM | POA: Insufficient documentation

## 2023-04-05 DIAGNOSIS — W19XXXD Unspecified fall, subsequent encounter: Secondary | ICD-10-CM

## 2023-04-05 NOTE — Progress Notes (Signed)
Provider:  Sydnee Cabal Location:  Other Nursing Home Room Number: 101A Place of Service:  SNF (31)  PCP: [name], MD Patient Care Team: [name], MD as PCP - General (Family Medicine) Earna Coder, MD as Consulting Physician (Internal Medicine) Lemar Livings, Merrily Pew, MD as Consulting Physician (General Surgery) Scarlett Presto, RN (Inactive) as Oncology Nurse Navigator Carmina Miller, MD as Referring Physician (Radiation Oncology)  Extended Emergency Contact Information Primary Emergency Contact: [facility] Phone: [phone] Relation: Daughter Secondary Emergency Contact: Rider,Kimberly Mobile Phone: [phone] Relation: Daughter  Code Status: DNR Goals of Care: Advanced Directive information    03/29/2023    6:35 PM  Advanced Directives  Does Patient Have a Medical Advance Directive? Yes  Type of Estate agent of [place];Living will  Does patient want to make changes to medical advance directive? No - Patient declined  Copy of Healthcare Power of Attorney in Chart? No - copy requested      Chief Complaint  Patient presents with   Admission to SNF    HPI: Patient is a 89 y.o. female seen today for admission to [facility]. Patient with PMH CHF, recurrent falls, HTN, DM . She was admitted to the hospital after a fall. She was trying to get into the shower and the chair slipped and she hit her head. She has a subarachnoid hemorrhage  She has neuropathy. She is on gabapentin and is on the highest dose. She doesn't have restless leg. She has discomfort - "wiggle worms with a stinger that starts at the feet and works it's way up. Massaging her legs seems to help a bit. She is having trouble with foot mobility as well.   She has had a few other falls in the last few months. Last one was when she "jumped up too quickly" and she fell into the person, EMT from [facility] came and she had fallen.   She gets medications  delivered. Due to macular degeneration, she needs more help to load the dishwasher due to her fall risk. They have been communicating with Thelma.   She feels like she may fall. She has fear of falling. She uses the walker all the time.   Past Medical History:  Diagnosis Date   Arthritis    Gout   Chronic kidney disease    Diarrhea    Hypercholesteremia    Hypertension    Neuropathy    feet and lower legs   Seasonal allergies    Skin cancer    face   Past Surgical History:  Procedure Laterality Date   ABDOMINAL HYSTERECTOMY  2000   APPENDECTOMY     BREAST BIOPSY Right 09/19/2019   affirm bx of calcs UOQ, x marker, path pending   BREAST BIOPSY Right 09/19/2019   [place] bx of mass,heart marker, path pending   BREAST BIOPSY Right 09/19/2019   [place] bx of LN, coil marker, path pending   CATARACT EXTRACTION Left    CATARACT EXTRACTION W/PHACO Right 10/24/2014   Procedure: CATARACT EXTRACTION PHACO AND INTRAOCULAR LENS PLACEMENT (IOC);  Surgeon: Lockie Mola, MD;  Location: [facility];  Service: Ophthalmology;  Laterality: Right;   ESOPHAGOGASTRODUODENOSCOPY N/A 03/07/2018   Procedure: ESOPHAGOGASTRODUODENOSCOPY (EGD);  Surgeon: Toney Reil, MD;  Location: [facility];  Service: Gastroenterology;  Laterality: N/A;   MASTECTOMY MODIFIED RADICAL Right 10/13/2019   Procedure: MASTECTOMY MODIFIED RADICAL;  Surgeon: Earline Mayotte, MD;  Location: [facility] ORS;  Service: General;  Laterality: Right;   RENAL ANGIOGRAPHY  Right 04/11/2018   Procedure: RENAL ANGIOGRAPHY;  Surgeon: Annice Needy, MD;  Location: [facility] INVASIVE CV LAB;  Service: Cardiovascular;  Laterality: Right;   SIMPLE MASTECTOMY WITH AXILLARY SENTINEL NODE BIOPSY Left 10/13/2019   Procedure: SIMPLE MASTECTOMY TRUE CUT BIOPSY, SENTINEL NODE BIOPSY;  Surgeon: Earline Mayotte, MD;  Location: [facility] ORS;  Service: General;  Laterality: Left;    reports that she has never smoked. She has never used smokeless  tobacco. She reports that she does not drink alcohol and does not use drugs. Social History   Socioeconomic History   Marital status: Widowed    Spouse name: Not on file   Number of children: Not on file   Years of education: Not on file   Highest education level: Not on file  Occupational History   Not on file  Tobacco Use   Smoking status: Never   Smokeless tobacco: Never  Vaping Use   Vaping status: Never Used  Substance and Sexual Activity   Alcohol use: No   Drug use: Never   Sexual activity: Not on file  Other Topics Concern   Not on file  Social History Narrative   2 daughters; lives in [address]; Runner, broadcasting/film/video retd. 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    Social Determinants of Health   Financial Resource Strain: Low Risk  (11/17/2022)   [facility]   Overall Financial Resource Strain (CARDIA)    Difficulty of Paying Living Expenses: Not hard at all  Food Insecurity: No Food Insecurity (03/29/2023)   Hunger Vital Sign    Worried About Running Out of Food in the Last Year: Never true    Ran Out of Food in the Last Year: Never true  Transportation Needs: No Transportation Needs (03/29/2023)   PRAPARE - Administrator, Civil Service (Medical): No    Lack of Transportation (Non-Medical): No  Physical Activity: Not on file  Stress: Not on file  Social Connections: Not on file  Intimate Partner Violence: Not At Risk (03/29/2023)   Humiliation, Afraid, Rape, and Kick questionnaire    Fear of Current or Ex-Partner: No    Emotionally Abused: No    Physically Abused: No    Sexually Abused: No    Functional Status Survey:    Family History  Problem Relation Age of Onset   Hypertension Mother    Hypertension Father    Stroke Father    Breast cancer Daughter 51    Health Maintenance  Topic Date Due   Medicare Annual Wellness (AWV)  Never done   FOOT EXAM  Never done    OPHTHALMOLOGY EXAM  Never done   Zoster Vaccines- Shingrix (1 of 2) 05/05/1946   DTaP/Tdap/Td (3 - Td or Tdap) 08/06/2020   INFLUENZA VACCINE  12/10/2022   COVID-19 Vaccine (5 - 2023-24 season) 01/10/2023   HEMOGLOBIN A1C  02/25/2023   Pneumonia Vaccine 51+ Years old  Completed   DEXA SCAN  Completed   HPV VACCINES  Aged Out    Allergies  Allergen Reactions   Penicillins Anaphylaxis   Drug Ingredient [Zinc]    Empagliflozin Rash    Outpatient Encounter Medications as of 04/05/2023  Medication Sig   amLODipine (NORVASC) 5 MG tablet Take 1 tablet (5 mg total) by mouth every evening. Skip the dose if SBP less than 140 mmHg   anastrozole (ARIMIDEX) 1 MG tablet Take 1 tablet (1 mg total) by mouth daily.  aspirin EC 81 MG tablet Take 1 tablet (81 mg total) by mouth daily.   calcitRIOL (ROCALTROL) 0.25 MCG capsule Take 0.25 mcg by mouth every other day. Monday,Wednesday, Friday   calcium carbonate (OS-CAL - DOSED IN MG OF ELEMENTAL CALCIUM) 1250 (500 Ca) MG tablet Take 2 tablets by mouth 2 (two) times daily with a meal.   citalopram (CELEXA) 10 MG tablet Take 10 mg by mouth daily.    clopidogrel (PLAVIX) 75 MG tablet Take 1 tablet (75 mg total) by mouth daily.   cyanocobalamin (VITAMIN B12) 1000 MCG tablet Take 1,000 mcg by mouth daily.   docusate sodium (COLACE) 100 MG capsule Take 100 mg by mouth daily as needed for mild constipation.   DULoxetine (CYMBALTA) 30 MG capsule Take 30 mg by mouth daily.   DULoxetine (CYMBALTA) 60 MG capsule Take 100 mg by mouth daily.   gabapentin (NEURONTIN) 300 MG capsule Take 300 mg by mouth 2 (two) times daily.   hydrALAZINE (APRESOLINE) 50 MG tablet Take 1 tablet (50 mg total) by mouth 3 (three) times daily. Skip the dose if SBP less than 130 mmHg   loratadine (CLARITIN) 10 MG tablet Take 10 mg by mouth daily as needed for allergies. AM   metoprolol succinate (TOPROL-XL) 25 MG 24 hr tablet Take 25 mg by mouth at bedtime.   simvastatin (ZOCOR) 20 MG  tablet Take 20 mg by mouth every evening. PM   triamcinolone cream (KENALOG) 0.1 % Apply 1 Application topically 2 (two) times daily.   valsartan (DIOVAN) 80 MG tablet Take 80 mg by mouth daily.   Vitamin D, Ergocalciferol, (DRISDOL) 1.25 MG (50000 UNIT) CAPS capsule Take 50,000 Units by mouth every 7 (seven) days.   No facility-administered encounter medications on file as of 04/05/2023.    Review of Systems  Vitals:   04/05/23 2156  BP: (!) 143/71  Pulse: 77  Resp: 17  SpO2: 93%  Weight: 136 lb 6.4 oz (61.9 kg)  Height: 5\' 2"  (1.575 m)   Body mass index is 24.95 kg/m. Physical Exam Cardiovascular:     Rate and Rhythm: Normal rate.  Abdominal:     General: Abdomen is flat.  Neurological:     Mental Status: She is alert and oriented to person, place, and time.     Comments: Gives name, [name] location, month, year, Months of the year backwards     Labs reviewed: Basic Metabolic Panel: Recent Labs    09/01/22 0529 09/02/22 0745 09/08/22 0913 09/09/22 0000 03/30/23 0418 03/31/23 0854 04/01/23 0519  NA 133*   < > 131*   < > 138 138 138  K 3.2*   < > 3.7   < > 3.8 4.2 4.4  CL 104   < > 101   < > 106 108 107  CO2 21*   < > 23   < > 21* 22 21*  GLUCOSE 113*   < > 154*   < > 134* 158* 156*  BUN 18   < > 19   < > 31* 30* 39*  CREATININE 1.55*   < > 1.41*   < > 1.29* 1.42* 1.58*  CALCIUM 7.0*   < > 7.6*   < > 8.8* 8.7* 8.5*  MG  --   --  1.5*  --   --  2.3 2.4  PHOS 1.9*  --   --   --   --  2.8 3.0   < > = values in this interval not displayed.  Liver Function Tests: Recent Labs    11/02/22 1942 11/17/22 1304 02/22/23 1444  AST 25 27 24   ALT 15 17 14   ALKPHOS 64 81 62  BILITOT 0.5 0.6 0.4  PROT 7.8 7.8 7.5  ALBUMIN 3.9 4.2 4.1   Recent Labs    08/22/22 1110 08/26/22 0526  LIPASE 29 26   No results for input(s): "AMMONIA" in the last 8760 hours. CBC: Recent Labs    11/02/22 1942 11/02/22 1942 11/17/22 1304 02/22/23 1444 03/29/23 0744  03/30/23 0418 03/31/23 0854 04/01/23 0519  WBC 7.8   < > 7.3 6.9   < > 7.0 7.2 8.9  NEUTROABS 3.8  --  4.0 3.1  --   --   --   --   HGB 10.8*   < > 11.2* 11.0*   < > 11.1* 12.5 12.1  HCT 33.5*  --  34.5* 33.9*   < > 32.5* 36.5 36.0  MCV 92.8  --  91.8 92.6   < > 91.5 90.6 92.1  PLT 217   < > 191 233   < > 183 191 200   < > = values in this interval not displayed.   Cardiac Enzymes: No results for input(s): "CKTOTAL", "CKMB", "CKMBINDEX", "TROPONINI" in the last 8760 hours. BNP: Invalid input(s): "POCBNP" Lab Results  Component Value Date   HGBA1C 7.0 (H) 08/26/2022   Lab Results  Component Value Date   TSH 2.573 08/26/2020   Lab Results  Component Value Date   VITAMINB12 401 03/06/2018   Lab Results  Component Value Date   FOLATE 19.7 03/06/2018   Lab Results  Component Value Date   FERRITIN 115 03/06/2018    Imaging and Procedures obtained prior to SNF admission: DG Hip Unilat W or Wo Pelvis 2-3 Views Right  Result Date: 03/29/2023 CLINICAL DATA:  Recent fall with right hip pain, initial encounter EXAM: DG HIP (WITH OR WITHOUT PELVIS) 3V RIGHT COMPARISON:  None Available. FINDINGS: Pelvic ring is intact. No acute fracture or dislocation is noted. No soft tissue abnormality is seen. IMPRESSION: No acute abnormality noted. Electronically Signed   By: Alcide Clever M.D.   On: 03/29/2023 20:38   CT Head Wo Contrast  Result Date: 03/29/2023 CLINICAL DATA:  Head trauma, intracranial arterial injury suspected f/u [facility] EXAM: CT HEAD WITHOUT CONTRAST TECHNIQUE: Contiguous axial images were obtained from the base of the skull through the vertex without intravenous contrast. RADIATION DOSE REDUCTION: This exam was performed according to the departmental dose-optimization program which includes automated exposure control, adjustment of the mA and/or kV according to patient size and/or use of iterative reconstruction technique. COMPARISON:  Same day CT head FINDINGS: Brain: No  hydrocephalus. No extra-axial fluid collection. No mastoid. No mass lesion. Unchanged possible trace subarachnoid hemorrhage in the left sylvian fissure. No CT evidence of acute cortical infarct. There is sequela mild chronic microvascular ischemic change. Vascular: No hyperdense vessel or unexpected calcification. Skull: Soft tissue swelling along the right lateral scalp. No evidence of an calvarial fracture. Sinuses/Orbits: Trace right mastoid effusion. No middle ear effusion. Paranasal sinuses are clear. Bilateral lens replacement. Orbits are otherwise unremarkable. Other: None. IMPRESSION: 1. Unchanged possible trace subarachnoid hemorrhage in the left Sylvian fissure. 2. Soft tissue swelling along the right lateral scalp. No evidence of an calvarial fracture. Electronically Signed   By: Lorenza Cambridge M.D.   On: 03/29/2023 16:59   CT Chest Wo Contrast  Result Date: 03/29/2023 CLINICAL DATA:  Unwitnessed fall.  Chest trauma.  EXAM: CT CHEST WITHOUT CONTRAST TECHNIQUE: Multidetector CT imaging of the chest was performed following the standard protocol without IV contrast. RADIATION DOSE REDUCTION: This exam was performed according to the departmental dose-optimization program which includes automated exposure control, adjustment of the mA and/or kV according to patient size and/or use of iterative reconstruction technique. COMPARISON:  CTA chest dated 08/22/2022 FINDINGS: Cardiovascular: Normal heart size. No significant pericardial fluid/thickening. Great vessels are normal in course and caliber. Coronary artery calcifications and aortic atherosclerosis. Mediastinum/Nodes: Imaged thyroid gland without nodules meeting criteria for imaging follow-up by size. Normal esophagus. No pathologically enlarged axillary, supraclavicular, mediastinal, or hilar lymph nodes. Lungs/Pleura: The central airways are patent. Similar middle lobe, lingular, and bilateral lower lobe scarring and traction bronchiectasis. No focal  consolidation. No pneumothorax. No pleural effusion. Upper abdomen: Multifocal pancreatic hypodensities, for example 2.4 cm hypoattenuating focus arising from the pancreatic neck (4:126), better evaluated on prior CT abdomen examinations. Atrophic left kidney. Musculoskeletal: No acute or abnormal lytic or blastic osseous lesions. Please see separately dictated CT thoracic spine report for detailed findings. Multilevel degenerative changes of the thoracic spine. Right axillary surgical clips. IMPRESSION: 1. No acute or traumatic intrathoracic abnormality. 2. Similar middle lobe, lingular, and bilateral lower lobe scarring and traction bronchiectasis. 3. Multifocal pancreatic hypodensities, better evaluated on prior CT abdomen examinations. 4. Aortic Atherosclerosis (ICD10-I70.0). Coronary artery calcifications. Assessment for potential risk factor modification, dietary therapy or pharmacologic therapy may be warranted, if clinically indicated. Electronically Signed   By: Agustin Cree M.D.   On: 03/29/2023 10:10   CT Cervical Spine Wo Contrast  Result Date: 03/29/2023 CLINICAL DATA:  Neck trauma (Age >= 65y). Fall with neck and back pain. EXAM: CT CERVICAL SPINE WITHOUT CONTRAST CT THORACIC SPINE WITHOUT CONTRAST TECHNIQUE: Multidetector CT imaging of the cervical and thoracic spine was performed without contrast. Multiplanar CT image reconstructions were also generated. RADIATION DOSE REDUCTION: This exam was performed according to the departmental dose-optimization program which includes automated exposure control, adjustment of the mA and/or kV according to patient size and/or use of iterative reconstruction technique. COMPARISON:  CT cervical spine 06/05/2022. FINDINGS: CT CERVICAL SPINE FINDINGS Alignment: Unchanged trace retrolisthesis of C3 on C4 and 3 mm anterolisthesis of C7 on T1. No traumatic malalignment. Skull base and vertebrae: No acute fracture. Normal craniocervical junction. No suspicious bone  lesions. Soft tissues and spinal canal: No prevertebral fluid or swelling. No visible canal hematoma. Disc levels: Multilevel cervical spondylosis, worst at C3-4, where there is at least mild spinal canal stenosis. Upper chest: No acute findings. Other: Atherosclerotic calcifications of the carotid bulbs. CT THORACIC SPINE FINDINGS Alignment: Exaggerated kyphosis of the upper thoracic spine. Vertebrae: Diffuse idiopathic skeletal hyperostosis. No acute fracture or suspicious bone lesion. Paraspinal and other soft tissues: Please refer to same-day chest CT report. Disc levels: No disc herniation, spinal canal stenosis or neural foraminal narrowing. IMPRESSION: 1. No acute fracture or traumatic malalignment of the cervical or thoracic spine. 2. Multilevel cervical spondylosis, worst at C3-4, where there is at least mild spinal canal stenosis. Electronically Signed   By: Orvan Falconer M.D.   On: 03/29/2023 10:08   CT T-SPINE NO CHARGE  Result Date: 03/29/2023 CLINICAL DATA:  Neck trauma (Age >= 65y). Fall with neck and back pain. EXAM: CT CERVICAL SPINE WITHOUT CONTRAST CT THORACIC SPINE WITHOUT CONTRAST TECHNIQUE: Multidetector CT imaging of the cervical and thoracic spine was performed without contrast. Multiplanar CT image reconstructions were also generated. RADIATION DOSE REDUCTION: This exam was performed according  to the departmental dose-optimization program which includes automated exposure control, adjustment of the mA and/or kV according to patient size and/or use of iterative reconstruction technique. COMPARISON:  CT cervical spine 06/05/2022. FINDINGS: CT CERVICAL SPINE FINDINGS Alignment: Unchanged trace retrolisthesis of C3 on C4 and 3 mm anterolisthesis of C7 on T1. No traumatic malalignment. Skull base and vertebrae: No acute fracture. Normal craniocervical junction. No suspicious bone lesions. Soft tissues and spinal canal: No prevertebral fluid or swelling. No visible canal hematoma. Disc  levels: Multilevel cervical spondylosis, worst at C3-4, where there is at least mild spinal canal stenosis. Upper chest: No acute findings. Other: Atherosclerotic calcifications of the carotid bulbs. CT THORACIC SPINE FINDINGS Alignment: Exaggerated kyphosis of the upper thoracic spine. Vertebrae: Diffuse idiopathic skeletal hyperostosis. No acute fracture or suspicious bone lesion. Paraspinal and other soft tissues: Please refer to same-day chest CT report. Disc levels: No disc herniation, spinal canal stenosis or neural foraminal narrowing. IMPRESSION: 1. No acute fracture or traumatic malalignment of the cervical or thoracic spine. 2. Multilevel cervical spondylosis, worst at C3-4, where there is at least mild spinal canal stenosis. Electronically Signed   By: Orvan Falconer M.D.   On: 03/29/2023 10:08   DG Knee Complete 4 Views Right  Result Date: 03/29/2023 CLINICAL DATA:  Fall.  Right knee pain. EXAM: RIGHT KNEE - COMPLETE 4+ VIEW COMPARISON:  None Available. FINDINGS: There is diffuse osteopenia of the visualized osseous structures. No acute fracture or dislocation. No aggressive osseous lesion. There are degenerative changes of the knee joint in the form of mildly reduced medial tibio-femoral compartment joint space, tibial spiking and osteophytosis. Note is also made of meniscal chondrocalcinosis. No knee effusion or focal soft tissue swelling. No radiopaque foreign bodies. IMPRESSION: *No acute osseous abnormality of the right knee joint. *Mild degenerative osteoarthritis. Electronically Signed   By: Jules Schick M.D.   On: 03/29/2023 08:59   CT HEAD WO CONTRAST  Result Date: 03/29/2023 CLINICAL DATA:  Head trauma, minor EXAM: CT HEAD WITHOUT CONTRAST TECHNIQUE: Contiguous axial images were obtained from the base of the skull through the vertex without intravenous contrast. RADIATION DOSE REDUCTION: This exam was performed according to the departmental dose-optimization program which includes  automated exposure control, adjustment of the mA and/or kV according to patient size and/or use of iterative reconstruction technique. COMPARISON:  08/22/2022 FINDINGS: Brain: Small high-density area at the lower left sylvian fissure, more globular than expected for hyperdense MCA, although contiguous with the MCA. No reported MCA syndrome. No evidence of acute infarct, hydrocephalus, mass, or collection. Vascular: Atheromatous calcification Skull: Right parietal scalp swelling without underlying fracture. Sinuses/Orbits: No evidence of injury Critical Value/emergent results were called by telephone at the time of interpretation on 03/29/2023 at 8:24 am to provider Jene Every , who verbally acknowledged these results. IMPRESSION: Tiny subarachnoid hemorrhage at the lower left sylvian fissure, likely traumatic, see reformats. Right parietal scalp hematoma without calvarial fracture. Electronically Signed   By: Tiburcio Pea M.D.   On: 03/29/2023 08:30    Assessment/Plan Subarachnoid hemorrhage (HCC)  Temporal arteritis (HCC)  Chronic diastolic CHF (congestive heart failure) (HCC) - Plan: Do not attempt resuscitation (DNR)  Hx of non-ST elevation myocardial infarction (NSTEMI)  Type 2 diabetes mellitus with stage 3b chronic kidney disease, without long-term current use of insulin (HCC)  Fall, subsequent encounter  Neuropathy  Arthritis of both feet  Benign essential hypertension Patient with recent fall. CT head noted tiny subarachnoid hemorrhage with right parietal scalp hematoma w/o fracture.  X-ray knee w/o acute osseous abnormality. CT chest and neck w/o acute findings hip x-ray negative. Slow cognitive processing, however, self corrects with tangential conversation. PT/OT evaluation. Some concern poor mobility in feet is contributed to fall. Patient also has significant neuropathy likely due to age and DM, continue gabapentin BID dosing. Mood is stable at this time, continue cymbalta and  celexa. Discussed concern for increased risk of falls with this combination of medications. Hx of NSTEMI continue ASA and statin therapy. BP well-controlled with amlodipine, hydralazine, metoprolol, and valsartan. Continue PT/OT at this time.   Family/ staff Communication: nursing  Labs/tests ordered: BMP and A1c in 1 week.

## 2023-04-07 ENCOUNTER — Non-Acute Institutional Stay (SKILLED_NURSING_FACILITY): Payer: Medicare PPO | Admitting: Student

## 2023-04-07 ENCOUNTER — Encounter: Payer: Self-pay | Admitting: Student

## 2023-04-07 DIAGNOSIS — I609 Nontraumatic subarachnoid hemorrhage, unspecified: Secondary | ICD-10-CM | POA: Diagnosis not present

## 2023-04-07 NOTE — Progress Notes (Unsigned)
[name] Room Number: 101 A Place of Service:  SNF [phone]) Provider:  Coralyn Helling, M.D.  [name], MD  Patient Care Team: [name], MD as PCP - General (Family Medicine) Earna Coder, MD as Consulting Physician (Internal Medicine) Lemar Livings Merrily Pew, MD as Consulting Physician (General Surgery) Scarlett Presto, RN (Inactive) as Oncology Nurse Navigator Carmina Miller, MD as Referring Physician (Radiation Oncology)  Extended Emergency Contact Information Primary Emergency Contact: [facility] Phone: [phone] Relation: Daughter Secondary Emergency Contact: Rider,Kimberly Mobile Phone: [phone] Relation: Daughter  Code Status:   Goals of care: Advanced Directive information    04/07/2023    1:01 PM  Advanced Directives  Does Patient Have a Medical Advance Directive? Yes  Type of Advance Directive Out of facility DNR (pink MOST or yellow form)  Does patient want to make changes to medical advance directive? No - Patient declined  Pre-existing out of facility DNR order (yellow form or pink MOST form) Yellow form placed in chart (order not valid for inpatient use)     Chief Complaint  Patient presents with   Goals of Care    Discuss care goals    HPI:  Pt is a 89 y.o. female seen today for an acute visit for Discussion of goals of care. Daughter is present for discussion and asks for an update. Discussed concern that patient has had some changes in short term memory with head injury. Unclear at this stage how long this will last. Discussed importance of increased support in the home at the time of discharge. Patient would like to go home for Thanksgiving and discussed with nursing and social work per facility protocol, patient can be signed out for the holiday.    Past Medical History:  Diagnosis Date   Arthritis    Gout   Chronic kidney disease    Diarrhea    Hypercholesteremia     Hypertension    Neuropathy    feet and lower legs   Seasonal allergies    Skin cancer    face   Past Surgical History:  Procedure Laterality Date   ABDOMINAL HYSTERECTOMY  2000   APPENDECTOMY     BREAST BIOPSY Right 09/19/2019   affirm bx of calcs UOQ, x marker, path pending   BREAST BIOPSY Right 09/19/2019   [place] bx of mass,heart marker, path pending   BREAST BIOPSY Right 09/19/2019   [place] bx of LN, coil marker, path pending   CATARACT EXTRACTION Left    CATARACT EXTRACTION W/PHACO Right 10/24/2014   Procedure: CATARACT EXTRACTION PHACO AND INTRAOCULAR LENS PLACEMENT (IOC);  Surgeon: Lockie Mola, MD;  Location: [facility];  Service: Ophthalmology;  Laterality: Right;   ESOPHAGOGASTRODUODENOSCOPY N/A 03/07/2018   Procedure: ESOPHAGOGASTRODUODENOSCOPY (EGD);  Surgeon: Toney Reil, MD;  Location: [facility];  Service: Gastroenterology;  Laterality: N/A;   MASTECTOMY MODIFIED RADICAL Right 10/13/2019   Procedure: MASTECTOMY MODIFIED RADICAL;  Surgeon: Earline Mayotte, MD;  Location: [facility] ORS;  Service: General;  Laterality: Right;   RENAL ANGIOGRAPHY Right 04/11/2018   Procedure: RENAL ANGIOGRAPHY;  Surgeon: Annice Needy, MD;  Location: [facility] INVASIVE CV LAB;  Service: Cardiovascular;  Laterality: Right;   SIMPLE MASTECTOMY WITH AXILLARY SENTINEL NODE BIOPSY Left 10/13/2019   Procedure: SIMPLE MASTECTOMY TRUE CUT BIOPSY, SENTINEL NODE BIOPSY;  Surgeon: Earline Mayotte, MD;  Location: [facility] ORS;  Service: General;  Laterality: Left;    Allergies  Allergen Reactions   Penicillins Anaphylaxis  Drug Ingredient [Zinc]    Empagliflozin Rash    Outpatient Encounter Medications as of 04/07/2023  Medication Sig   acetaminophen (TYLENOL) 325 MG tablet Take 650 mg by mouth 3 (three) times daily.   amLODipine (NORVASC) 5 MG tablet Take 1 tablet (5 mg total) by mouth every evening. Skip the dose if SBP less than 140 mmHg   anastrozole (ARIMIDEX) 1 MG tablet Take 1  tablet (1 mg total) by mouth daily.   aspirin EC 81 MG tablet Take 1 tablet (81 mg total) by mouth daily.   calcitRIOL (ROCALTROL) 0.25 MCG capsule Take 0.25 mcg by mouth as directed. Monday,Wednesday, Friday   calcium carbonate (OS-CAL - DOSED IN MG OF ELEMENTAL CALCIUM) 1250 (500 Ca) MG tablet Take 1 tablet by mouth daily.   citalopram (CELEXA) 10 MG tablet Take 10 mg by mouth daily.    clopidogrel (PLAVIX) 75 MG tablet Take 1 tablet (75 mg total) by mouth daily.   cyanocobalamin (VITAMIN B12) 1000 MCG tablet Take 1,000 mcg by mouth daily.   docusate sodium (COLACE) 100 MG capsule Take 100 mg by mouth daily as needed for mild constipation.   DULoxetine (CYMBALTA) 60 MG capsule Take 60 mg by mouth daily.   gabapentin (NEURONTIN) 300 MG capsule Take 300 mg by mouth 2 (two) times daily.   hydrALAZINE (APRESOLINE) 50 MG tablet Take 1 tablet (50 mg total) by mouth 3 (three) times daily. Skip the dose if SBP less than 130 mmHg   loratadine (CLARITIN) 10 MG tablet Take 10 mg by mouth daily as needed for allergies. AM   metoprolol succinate (TOPROL-XL) 25 MG 24 hr tablet Take 25 mg by mouth at bedtime.   simvastatin (ZOCOR) 20 MG tablet Take 20 mg by mouth every evening. PM   triamcinolone cream (KENALOG) 0.1 % Apply 1 Application topically 2 (two) times daily.   valsartan (DIOVAN) 80 MG tablet Take 80 mg by mouth daily.   Vitamin D, Ergocalciferol, (DRISDOL) 1.25 MG (50000 UNIT) CAPS capsule Take 50,000 Units by mouth every 7 (seven) days.   Zinc Oxide (TRIPLE PASTE EX) Apply 1 application  topically as directed. Every shift   DULoxetine (CYMBALTA) 30 MG capsule Take 30 mg by mouth daily. (Patient not taking: Reported on 04/07/2023)   No facility-administered encounter medications on file as of 04/07/2023.    Review of Systems  Immunization History  Administered Date(s) Administered   Influenza Inj Mdck Quad Pf 03/26/2016, 02/14/2018, 02/15/2019, 02/25/2021, 03/02/2022   Influenza Split  02/20/2015   Influenza, Seasonal, Injecte, Preservative Fre 04/13/2012, 03/17/2013   Influenza,inj,Quad PF,6+ Mos 03/07/2014, 02/20/2020   Influenza-Unspecified 03/26/2016, 02/04/2017, 02/14/2018, 02/25/2021   Moderna Sars-Covid-2 Vaccination 05/24/2019, 06/24/2019, 02/22/2020, 02/26/2022   Pneumococcal Conjugate-13 09/01/2013   Pneumococcal Polysaccharide-23 04/13/2012   Td 08/07/2010   Tdap 08/07/2010   Zoster, Live 09/12/2007   Pertinent  Health Maintenance Due  Topic Date Due   FOOT EXAM  Never done   OPHTHALMOLOGY EXAM  Never done   INFLUENZA VACCINE  12/10/2022   HEMOGLOBIN A1C  02/25/2023   DEXA SCAN  Completed      01/09/2022   10:55 AM 02/18/2022    1:36 PM 03/18/2022    4:19 PM 03/18/2022    4:46 PM 05/13/2022   10:17 AM  Fall Risk  (RETIRED) Patient Fall Risk Level High fall risk High fall risk Moderate fall risk Moderate fall risk High fall risk   Functional Status Survey:    Vitals:   04/07/23 1300  BP: Marland Kitchen)  146/68  Pulse: 80  Temp: 98.6 F (37 C)  SpO2: 95%  Weight: 135 lb 9.6 oz (61.5 kg)  Height: 5\' 2"  (1.575 m)   Body mass index is 24.8 kg/m. Physical Exam  Labs reviewed: Recent Labs    09/01/22 0529 09/02/22 0745 09/08/22 0913 09/09/22 0000 03/30/23 0418 03/31/23 0854 04/01/23 0519  NA 133*   < > 131*   < > 138 138 138  K 3.2*   < > 3.7   < > 3.8 4.2 4.4  CL 104   < > 101   < > 106 108 107  CO2 21*   < > 23   < > 21* 22 21*  GLUCOSE 113*   < > 154*   < > 134* 158* 156*  BUN 18   < > 19   < > 31* 30* 39*  CREATININE 1.55*   < > 1.41*   < > 1.29* 1.42* 1.58*  CALCIUM 7.0*   < > 7.6*   < > 8.8* 8.7* 8.5*  MG  --   --  1.5*  --   --  2.3 2.4  PHOS 1.9*  --   --   --   --  2.8 3.0   < > = values in this interval not displayed.   Recent Labs    11/02/22 1942 11/17/22 1304 02/22/23 1444  AST 25 27 24   ALT 15 17 14   ALKPHOS 64 81 62  BILITOT 0.5 0.6 0.4  PROT 7.8 7.8 7.5  ALBUMIN 3.9 4.2 4.1   Recent Labs    11/02/22 1942  11/02/22 1942 11/17/22 1304 02/22/23 1444 03/29/23 0744 03/30/23 0418 03/31/23 0854 04/01/23 0519  WBC 7.8   < > 7.3 6.9   < > 7.0 7.2 8.9  NEUTROABS 3.8  --  4.0 3.1  --   --   --   --   HGB 10.8*   < > 11.2* 11.0*   < > 11.1* 12.5 12.1  HCT 33.5*  --  34.5* 33.9*   < > 32.5* 36.5 36.0  MCV 92.8  --  91.8 92.6   < > 91.5 90.6 92.1  PLT 217   < > 191 233   < > 183 191 200   < > = values in this interval not displayed.   Lab Results  Component Value Date   TSH 2.573 08/26/2020   Lab Results  Component Value Date   HGBA1C 7.0 (H) 08/26/2022   Lab Results  Component Value Date   CHOL 75 08/27/2022   HDL 22 (L) 08/27/2022   LDLCALC 34 08/27/2022   TRIG 97 08/27/2022   CHOLHDL 3.4 08/27/2022    Significant Diagnostic Results in last 30 days:  DG Hip Unilat W or Wo Pelvis 2-3 Views Right  Result Date: 03/29/2023 CLINICAL DATA:  Recent fall with right hip pain, initial encounter EXAM: DG HIP (WITH OR WITHOUT PELVIS) 3V RIGHT COMPARISON:  None Available. FINDINGS: Pelvic ring is intact. No acute fracture or dislocation is noted. No soft tissue abnormality is seen. IMPRESSION: No acute abnormality noted. Electronically Signed   By: Alcide Clever M.D.   On: 03/29/2023 20:38   CT Head Wo Contrast  Result Date: 03/29/2023 CLINICAL DATA:  Head trauma, intracranial arterial injury suspected f/u SAH EXAM: CT HEAD WITHOUT CONTRAST TECHNIQUE: Contiguous axial images were obtained from the base of the skull through the vertex without intravenous contrast. RADIATION DOSE REDUCTION: This exam was performed according to  the departmental dose-optimization program which includes automated exposure control, adjustment of the mA and/or kV according to patient size and/or use of iterative reconstruction technique. COMPARISON:  Same day CT head FINDINGS: Brain: No hydrocephalus. No extra-axial fluid collection. No mastoid. No mass lesion. Unchanged possible trace subarachnoid hemorrhage in the left  sylvian fissure. No CT evidence of acute cortical infarct. There is sequela mild chronic microvascular ischemic change. Vascular: No hyperdense vessel or unexpected calcification. Skull: Soft tissue swelling along the right lateral scalp. No evidence of an calvarial fracture. Sinuses/Orbits: Trace right mastoid effusion. No middle ear effusion. Paranasal sinuses are clear. Bilateral lens replacement. Orbits are otherwise unremarkable. Other: None. IMPRESSION: 1. Unchanged possible trace subarachnoid hemorrhage in the left Sylvian fissure. 2. Soft tissue swelling along the right lateral scalp. No evidence of an calvarial fracture. Electronically Signed   By: Lorenza Cambridge M.D.   On: 03/29/2023 16:59   CT Chest Wo Contrast  Result Date: 03/29/2023 CLINICAL DATA:  Unwitnessed fall.  Chest trauma. EXAM: CT CHEST WITHOUT CONTRAST TECHNIQUE: Multidetector CT imaging of the chest was performed following the standard protocol without IV contrast. RADIATION DOSE REDUCTION: This exam was performed according to the departmental dose-optimization program which includes automated exposure control, adjustment of the mA and/or kV according to patient size and/or use of iterative reconstruction technique. COMPARISON:  CTA chest dated 08/22/2022 FINDINGS: Cardiovascular: Normal heart size. No significant pericardial fluid/thickening. Great vessels are normal in course and caliber. Coronary artery calcifications and aortic atherosclerosis. Mediastinum/Nodes: Imaged thyroid gland without nodules meeting criteria for imaging follow-up by size. Normal esophagus. No pathologically enlarged axillary, supraclavicular, mediastinal, or hilar lymph nodes. Lungs/Pleura: The central airways are patent. Similar middle lobe, lingular, and bilateral lower lobe scarring and traction bronchiectasis. No focal consolidation. No pneumothorax. No pleural effusion. Upper abdomen: Multifocal pancreatic hypodensities, for example 2.4 cm  hypoattenuating focus arising from the pancreatic neck (4:126), better evaluated on prior CT abdomen examinations. Atrophic left kidney. Musculoskeletal: No acute or abnormal lytic or blastic osseous lesions. Please see separately dictated CT thoracic spine report for detailed findings. Multilevel degenerative changes of the thoracic spine. Right axillary surgical clips. IMPRESSION: 1. No acute or traumatic intrathoracic abnormality. 2. Similar middle lobe, lingular, and bilateral lower lobe scarring and traction bronchiectasis. 3. Multifocal pancreatic hypodensities, better evaluated on prior CT abdomen examinations. 4. Aortic Atherosclerosis (ICD10-I70.0). Coronary artery calcifications. Assessment for potential risk factor modification, dietary therapy or pharmacologic therapy may be warranted, if clinically indicated. Electronically Signed   By: Agustin Cree M.D.   On: 03/29/2023 10:10   CT Cervical Spine Wo Contrast  Result Date: 03/29/2023 CLINICAL DATA:  Neck trauma (Age >= 65y). Fall with neck and back pain. EXAM: CT CERVICAL SPINE WITHOUT CONTRAST CT THORACIC SPINE WITHOUT CONTRAST TECHNIQUE: Multidetector CT imaging of the cervical and thoracic spine was performed without contrast. Multiplanar CT image reconstructions were also generated. RADIATION DOSE REDUCTION: This exam was performed according to the departmental dose-optimization program which includes automated exposure control, adjustment of the mA and/or kV according to patient size and/or use of iterative reconstruction technique. COMPARISON:  CT cervical spine 06/05/2022. FINDINGS: CT CERVICAL SPINE FINDINGS Alignment: Unchanged trace retrolisthesis of C3 on C4 and 3 mm anterolisthesis of C7 on T1. No traumatic malalignment. Skull base and vertebrae: No acute fracture. Normal craniocervical junction. No suspicious bone lesions. Soft tissues and spinal canal: No prevertebral fluid or swelling. No visible canal hematoma. Disc levels: Multilevel  cervical spondylosis, worst at C3-4, where there is at  least mild spinal canal stenosis. Upper chest: No acute findings. Other: Atherosclerotic calcifications of the carotid bulbs. CT THORACIC SPINE FINDINGS Alignment: Exaggerated kyphosis of the upper thoracic spine. Vertebrae: Diffuse idiopathic skeletal hyperostosis. No acute fracture or suspicious bone lesion. Paraspinal and other soft tissues: Please refer to same-day chest CT report. Disc levels: No disc herniation, spinal canal stenosis or neural foraminal narrowing. IMPRESSION: 1. No acute fracture or traumatic malalignment of the cervical or thoracic spine. 2. Multilevel cervical spondylosis, worst at C3-4, where there is at least mild spinal canal stenosis. Electronically Signed   By: Orvan Falconer M.D.   On: 03/29/2023 10:08   CT T-SPINE NO CHARGE  Result Date: 03/29/2023 CLINICAL DATA:  Neck trauma (Age >= 65y). Fall with neck and back pain. EXAM: CT CERVICAL SPINE WITHOUT CONTRAST CT THORACIC SPINE WITHOUT CONTRAST TECHNIQUE: Multidetector CT imaging of the cervical and thoracic spine was performed without contrast. Multiplanar CT image reconstructions were also generated. RADIATION DOSE REDUCTION: This exam was performed according to the departmental dose-optimization program which includes automated exposure control, adjustment of the mA and/or kV according to patient size and/or use of iterative reconstruction technique. COMPARISON:  CT cervical spine 06/05/2022. FINDINGS: CT CERVICAL SPINE FINDINGS Alignment: Unchanged trace retrolisthesis of C3 on C4 and 3 mm anterolisthesis of C7 on T1. No traumatic malalignment. Skull base and vertebrae: No acute fracture. Normal craniocervical junction. No suspicious bone lesions. Soft tissues and spinal canal: No prevertebral fluid or swelling. No visible canal hematoma. Disc levels: Multilevel cervical spondylosis, worst at C3-4, where there is at least mild spinal canal stenosis. Upper chest: No acute  findings. Other: Atherosclerotic calcifications of the carotid bulbs. CT THORACIC SPINE FINDINGS Alignment: Exaggerated kyphosis of the upper thoracic spine. Vertebrae: Diffuse idiopathic skeletal hyperostosis. No acute fracture or suspicious bone lesion. Paraspinal and other soft tissues: Please refer to same-day chest CT report. Disc levels: No disc herniation, spinal canal stenosis or neural foraminal narrowing. IMPRESSION: 1. No acute fracture or traumatic malalignment of the cervical or thoracic spine. 2. Multilevel cervical spondylosis, worst at C3-4, where there is at least mild spinal canal stenosis. Electronically Signed   By: Orvan Falconer M.D.   On: 03/29/2023 10:08   DG Knee Complete 4 Views Right  Result Date: 03/29/2023 CLINICAL DATA:  Fall.  Right knee pain. EXAM: RIGHT KNEE - COMPLETE 4+ VIEW COMPARISON:  None Available. FINDINGS: There is diffuse osteopenia of the visualized osseous structures. No acute fracture or dislocation. No aggressive osseous lesion. There are degenerative changes of the knee joint in the form of mildly reduced medial tibio-femoral compartment joint space, tibial spiking and osteophytosis. Note is also made of meniscal chondrocalcinosis. No knee effusion or focal soft tissue swelling. No radiopaque foreign bodies. IMPRESSION: *No acute osseous abnormality of the right knee joint. *Mild degenerative osteoarthritis. Electronically Signed   By: Jules Schick M.D.   On: 03/29/2023 08:59   CT HEAD WO CONTRAST  Result Date: 03/29/2023 CLINICAL DATA:  Head trauma, minor EXAM: CT HEAD WITHOUT CONTRAST TECHNIQUE: Contiguous axial images were obtained from the base of the skull through the vertex without intravenous contrast. RADIATION DOSE REDUCTION: This exam was performed according to the departmental dose-optimization program which includes automated exposure control, adjustment of the mA and/or kV according to patient size and/or use of iterative reconstruction  technique. COMPARISON:  08/22/2022 FINDINGS: Brain: Small high-density area at the lower left sylvian fissure, more globular than expected for hyperdense MCA, although contiguous with the MCA. No reported  MCA syndrome. No evidence of acute infarct, hydrocephalus, mass, or collection. Vascular: Atheromatous calcification Skull: Right parietal scalp swelling without underlying fracture. Sinuses/Orbits: No evidence of injury Critical Value/emergent results were called by telephone at the time of interpretation on 03/29/2023 at 8:24 am to provider Jene Every , who verbally acknowledged these results. IMPRESSION: Tiny subarachnoid hemorrhage at the lower left sylvian fissure, likely traumatic, see reformats. Right parietal scalp hematoma without calvarial fracture. Electronically Signed   By: Tiburcio Pea M.D.   On: 03/29/2023 08:30    Assessment/Plan Subarachnoid hemorrhage [facility]) Patient with SAH and some memory deficits. Unclear of permanence of these findings. Continue PT as tolerated.   Family/ staff Communication: daughter, nursing  Labs/tests ordered:  none

## 2023-04-08 ENCOUNTER — Encounter: Payer: Self-pay | Admitting: Student

## 2023-04-12 LAB — BASIC METABOLIC PANEL
BUN: 27 — AB (ref 4–21)
CO2: 27 — AB (ref 13–22)
Chloride: 105 (ref 99–108)
Creatinine: 1.4 — AB (ref 0.5–1.1)
Glucose: 142
Potassium: 4.9 meq/L (ref 3.5–5.1)
Sodium: 140 (ref 137–147)

## 2023-04-12 LAB — COMPREHENSIVE METABOLIC PANEL
Calcium: 10.1 (ref 8.7–10.7)
eGFR: 35

## 2023-04-12 LAB — HEMOGLOBIN A1C: Hemoglobin A1C: 7.3

## 2023-04-14 ENCOUNTER — Encounter: Payer: Self-pay | Admitting: Student

## 2023-04-15 ENCOUNTER — Encounter: Payer: Self-pay | Admitting: Student

## 2023-04-15 ENCOUNTER — Non-Acute Institutional Stay (SKILLED_NURSING_FACILITY): Payer: Medicare PPO | Admitting: Nurse Practitioner

## 2023-04-15 ENCOUNTER — Encounter: Payer: Self-pay | Admitting: Nurse Practitioner

## 2023-04-15 DIAGNOSIS — I609 Nontraumatic subarachnoid hemorrhage, unspecified: Secondary | ICD-10-CM

## 2023-04-15 DIAGNOSIS — M19072 Primary osteoarthritis, left ankle and foot: Secondary | ICD-10-CM

## 2023-04-15 DIAGNOSIS — R197 Diarrhea, unspecified: Secondary | ICD-10-CM

## 2023-04-15 DIAGNOSIS — E1122 Type 2 diabetes mellitus with diabetic chronic kidney disease: Secondary | ICD-10-CM | POA: Diagnosis not present

## 2023-04-15 DIAGNOSIS — W19XXXD Unspecified fall, subsequent encounter: Secondary | ICD-10-CM

## 2023-04-15 DIAGNOSIS — F339 Major depressive disorder, recurrent, unspecified: Secondary | ICD-10-CM

## 2023-04-15 DIAGNOSIS — M19071 Primary osteoarthritis, right ankle and foot: Secondary | ICD-10-CM

## 2023-04-15 DIAGNOSIS — N1832 Chronic kidney disease, stage 3b: Secondary | ICD-10-CM

## 2023-04-15 DIAGNOSIS — I10 Essential (primary) hypertension: Secondary | ICD-10-CM

## 2023-04-15 NOTE — Progress Notes (Signed)
Location:  Other [facility].  Nursing Home Room Number: [facility] 101A Place of Service:  SNF (31) Abbey Chatters, NP  PCP: [name], MD  Patient Care Team: [name], MD as PCP - General (Family Medicine) Earna Coder, MD as Consulting Physician (Internal Medicine) Lemar Livings Merrily Pew, MD as Consulting Physician (General Surgery) Scarlett Presto, RN (Inactive) as Oncology Nurse Navigator Carmina Miller, MD as Referring Physician (Radiation Oncology)  Extended Emergency Contact Information Primary Emergency Contact: [facility] Phone: [phone] Relation: Daughter Secondary Emergency Contact: Rider,Kimberly Mobile Phone: [phone] Relation: Daughter  Goals of care: Advanced Directive information    04/15/2023    9:22 AM  Advanced Directives  Does Patient Have a Medical Advance Directive? Yes  Type of Advance Directive Out of facility DNR (pink MOST or yellow form)  Does patient want to make changes to medical advance directive? No - Patient declined     Chief Complaint  Patient presents with   Discharge Note    Discharge.     HPI:  Pt is a 89 y.o. female seen today for Discharge home.  Pt is at [facility] after hospitalization due to fall.  Noted to have trace subarachnoid hemorrhage which was stable with repeat scans.  Neurosurgery was consulted and recommended conservative management.  She has been doing well at rehab and now ready to discharge home with PT/OT at [facility].    Past Medical History:  Diagnosis Date   Arthritis    Gout   Chronic kidney disease    Diarrhea    Hypercholesteremia    Hypertension    Neuropathy    feet and lower legs   Seasonal allergies    Skin cancer    face   Past Surgical History:  Procedure Laterality Date   ABDOMINAL HYSTERECTOMY  2000   APPENDECTOMY     BREAST BIOPSY Right 09/19/2019   affirm bx of calcs UOQ, x marker, path pending   BREAST BIOPSY Right  09/19/2019   [place] bx of mass,heart marker, path pending   BREAST BIOPSY Right 09/19/2019   [place] bx of LN, coil marker, path pending   CATARACT EXTRACTION Left    CATARACT EXTRACTION W/PHACO Right 10/24/2014   Procedure: CATARACT EXTRACTION PHACO AND INTRAOCULAR LENS PLACEMENT (IOC);  Surgeon: Lockie Mola, MD;  Location: [facility];  Service: Ophthalmology;  Laterality: Right;   ESOPHAGOGASTRODUODENOSCOPY N/A 03/07/2018   Procedure: ESOPHAGOGASTRODUODENOSCOPY (EGD);  Surgeon: Toney Reil, MD;  Location: [facility] END[facility];  Service: Gastroenterology;  Laterality: N/A;   MASTECTOMY MODIFIED RADICAL Right 10/13/2019   Procedure: MASTECTOMY MODIFIED RADICAL;  Surgeon: Earline Mayotte, MD;  Location: [facility] ORS;  Service: General;  Laterality: Right;   RENAL ANGIOGRAPHY Right 04/11/2018   Procedure: RENAL ANGIOGRAPHY;  Surgeon: Annice Needy, MD;  Location: [facility] INVASIVE CV LAB;  Service: Cardiovascular;  Laterality: Right;   SIMPLE MASTECTOMY WITH AXILLARY SENTINEL NODE BIOPSY Left 10/13/2019   Procedure: SIMPLE MASTECTOMY TRUE CUT BIOPSY, SENTINEL NODE BIOPSY;  Surgeon: Earline Mayotte, MD;  Location: [facility] ORS;  Service: General;  Laterality: Left;    Allergies  Allergen Reactions   Penicillins Anaphylaxis   Drug Ingredient [Zinc]    Empagliflozin Rash    Outpatient Encounter Medications as of 04/15/2023  Medication Sig   acetaminophen (TYLENOL) 325 MG tablet Take 650 mg by mouth 3 (three) times daily.   amLODipine (NORVASC) 5 MG tablet Take 1 tablet (5 mg total) by mouth every evening. Skip the  dose if SBP less than 140 mmHg   anastrozole (ARIMIDEX) 1 MG tablet Take 1 tablet (1 mg total) by mouth daily.   aspirin EC 81 MG tablet Take 1 tablet (81 mg total) by mouth daily.   calcitRIOL (ROCALTROL) 0.25 MCG capsule Take 0.25 mcg by mouth as directed. Monday,Wednesday, Friday   calcium carbonate (OS-CAL - DOSED IN MG OF ELEMENTAL CALCIUM) 1250 (500 Ca) MG tablet Take 1 tablet  by mouth 2 (two) times daily with a meal.   citalopram (CELEXA) 10 MG tablet Take 10 mg by mouth daily.    clopidogrel (PLAVIX) 75 MG tablet Take 1 tablet (75 mg total) by mouth daily.   cyanocobalamin (VITAMIN B12) 1000 MCG tablet Take 1,000 mcg by mouth daily.   diclofenac Sodium (VOLTAREN) 1 % GEL Apply 2 g topically every 6 (six) hours. To bilateral legs.   docusate sodium (COLACE) 100 MG capsule Take 100 mg by mouth daily as needed for mild constipation.   DULoxetine (CYMBALTA) 60 MG capsule Take 60 mg by mouth daily.   gabapentin (NEURONTIN) 300 MG capsule Take 300 mg by mouth 2 (two) times daily.   hydrALAZINE (APRESOLINE) 50 MG tablet Take 1 tablet (50 mg total) by mouth 3 (three) times daily. Skip the dose if SBP less than 130 mmHg   loratadine (CLARITIN) 10 MG tablet Take 10 mg by mouth daily as needed for allergies. AM   metoprolol succinate (TOPROL-XL) 25 MG 24 hr tablet Take 25 mg by mouth at bedtime.   simvastatin (ZOCOR) 20 MG tablet Take 20 mg by mouth every evening. PM   triamcinolone cream (KENALOG) 0.1 % Apply 1 Application topically 2 (two) times daily.   valsartan (DIOVAN) 80 MG tablet Take 80 mg by mouth daily.   Vitamin D, Ergocalciferol, (DRISDOL) 1.25 MG (50000 UNIT) CAPS capsule Take 50,000 Units by mouth every 7 (seven) days.   Wheat Dextrin (BENEFIBER ON THE GO) PACK Take 1 packet by mouth daily.   Zinc Oxide (TRIPLE PASTE EX) Apply 1 application  topically as directed. Every shift   DULoxetine (CYMBALTA) 30 MG capsule Take 30 mg by mouth daily. (Patient not taking: Reported on 04/07/2023)   No facility-administered encounter medications on file as of 04/15/2023.    Review of Systems  Constitutional:  Negative for activity change, appetite change, fatigue and unexpected weight change.  HENT:  Negative for congestion and hearing loss.   Eyes: Negative.   Respiratory:  Negative for cough and shortness of breath.   Cardiovascular:  Negative for chest pain,  palpitations and leg swelling.  Gastrointestinal:  Positive for diarrhea (loose stools). Negative for abdominal pain and constipation.  Genitourinary:  Negative for difficulty urinating and dysuria.  Musculoskeletal:  Positive for arthralgias and myalgias.  Skin:  Negative for color change and wound.  Neurological:  Negative for dizziness and weakness.  Psychiatric/Behavioral:  Negative for agitation, behavioral problems and confusion.      Immunization History  Administered Date(s) Administered   Influenza Inj Mdck Quad Pf 03/26/2016, 02/14/2018, 02/15/2019, 02/25/2021, 03/02/2022   Influenza Split 02/20/2015   Influenza, Seasonal, Injecte, Preservative Fre 04/13/2012, 03/17/2013   Influenza,inj,Quad PF,6+ Mos 03/07/2014, 02/20/2020   Influenza-Unspecified 03/26/2016, 02/04/2017, 02/14/2018, 02/25/2021, 04/12/2023   Moderna Sars-Covid-2 Vaccination 05/24/2019, 06/24/2019, 02/22/2020, 02/26/2022   PNEUMOCOCCAL CONJUGATE-20 04/09/2023   Pneumococcal Conjugate-13 09/01/2013   Pneumococcal Polysaccharide-23 04/13/2012   RSV,unspecified 04/09/2023   Td 08/07/2010   Tdap 08/07/2010   Zoster, Live 09/12/2007   Pertinent  Health Maintenance Due  Topic Date Due   FOOT EXAM  Never done   OPHTHALMOLOGY EXAM  Never done   HEMOGLOBIN A1C  10/11/2023   INFLUENZA VACCINE  Completed   DEXA SCAN  Completed      01/09/2022   10:55 AM 02/18/2022    1:36 PM 03/18/2022    4:19 PM 03/18/2022    4:46 PM 05/13/2022   10:17 AM  Fall Risk  (RETIRED) Patient Fall Risk Level High fall risk High fall risk Moderate fall risk Moderate fall risk High fall risk   Functional Status Survey:    Vitals:   04/15/23 0902  BP: 135/60  Pulse: 67  Resp: 16  Temp: 97.8 F (36.6 C)  SpO2: 96%  Weight: 135 lb 6.4 oz (61.4 kg)  Height: 5\' 2"  (1.575 m)   Body mass index is 24.76 kg/m. Physical Exam Constitutional:      General: She is not in acute distress.    Appearance: She is well-developed. She is not  diaphoretic.  HENT:     Head: Normocephalic and atraumatic.     Mouth/Throat:     Pharynx: No oropharyngeal exudate.  Eyes:     Conjunctiva/sclera: Conjunctivae normal.     Pupils: Pupils are equal, round, and reactive to light.  Cardiovascular:     Rate and Rhythm: Normal rate and regular rhythm.     Heart sounds: Normal heart sounds.  Pulmonary:     Effort: Pulmonary effort is normal.     Breath sounds: Normal breath sounds.  Abdominal:     General: Bowel sounds are normal.     Palpations: Abdomen is soft.  Musculoskeletal:     Cervical back: Normal range of motion and neck supple.     Right lower leg: No edema.     Left lower leg: No edema.  Skin:    General: Skin is warm and dry.  Neurological:     Mental Status: She is alert. Mental status is at baseline.  Psychiatric:        Mood and Affect: Mood normal.     Labs reviewed: Recent Labs    09/01/22 0529 09/02/22 0745 09/08/22 0913 09/09/22 0000 03/30/23 0418 03/31/23 0854 04/01/23 0519 04/12/23 0000  NA 133*   < > 131*   < > 138 138 138 140  K 3.2*   < > 3.7   < > 3.8 4.2 4.4 4.9  CL 104   < > 101   < > 106 108 107 105  CO2 21*   < > 23   < > 21* 22 21* 27*  GLUCOSE 113*   < > 154*   < > 134* 158* 156*  --   BUN 18   < > 19   < > 31* 30* 39* 27*  CREATININE 1.55*   < > 1.41*   < > 1.29* 1.42* 1.58* 1.4*  CALCIUM 7.0*   < > 7.6*   < > 8.8* 8.7* 8.5* 10.1  MG  --   --  1.5*  --   --  2.3 2.4  --   PHOS 1.9*  --   --   --   --  2.8 3.0  --    < > = values in this interval not displayed.   Recent Labs    11/02/22 1942 11/17/22 1304 02/22/23 1444  AST 25 27 24   ALT 15 17 14   ALKPHOS 64 81 62  BILITOT 0.5 0.6 0.4  PROT 7.8 7.8 7.5  ALBUMIN 3.9 4.2 4.1   Recent Labs    11/02/22 1942 11/02/22 1942 11/17/22 1304 02/22/23 1444 03/29/23 0744 03/30/23 0418 03/31/23 0854 04/01/23 0519  WBC 7.8   < > 7.3 6.9   < > 7.0 7.2 8.9  NEUTROABS 3.8  --  4.0 3.1  --   --   --   --   HGB 10.8*   < > 11.2*  11.0*   < > 11.1* 12.5 12.1  HCT 33.5*  --  34.5* 33.9*   < > 32.5* 36.5 36.0  MCV 92.8  --  91.8 92.6   < > 91.5 90.6 92.1  PLT 217   < > 191 233   < > 183 191 200   < > = values in this interval not displayed.   Lab Results  Component Value Date   TSH 2.573 08/26/2020   Lab Results  Component Value Date   HGBA1C 7.3 04/12/2023   Lab Results  Component Value Date   CHOL 75 08/27/2022   HDL 22 (L) 08/27/2022   LDLCALC 34 08/27/2022   TRIG 97 08/27/2022   CHOLHDL 3.4 08/27/2022    Significant Diagnostic Results in last 30 days:  DG Hip Unilat W or Wo Pelvis 2-3 Views Right  Result Date: 03/29/2023 CLINICAL DATA:  Recent fall with right hip pain, initial encounter EXAM: DG HIP (WITH OR WITHOUT PELVIS) 3V RIGHT COMPARISON:  None Available. FINDINGS: Pelvic ring is intact. No acute fracture or dislocation is noted. No soft tissue abnormality is seen. IMPRESSION: No acute abnormality noted. Electronically Signed   By: Alcide Clever M.D.   On: 03/29/2023 20:38   CT Head Wo Contrast  Result Date: 03/29/2023 CLINICAL DATA:  Head trauma, intracranial arterial injury suspected f/u [facility] EXAM: CT HEAD WITHOUT CONTRAST TECHNIQUE: Contiguous axial images were obtained from the base of the skull through the vertex without intravenous contrast. RADIATION DOSE REDUCTION: This exam was performed according to the departmental dose-optimization program which includes automated exposure control, adjustment of the mA and/or kV according to patient size and/or use of iterative reconstruction technique. COMPARISON:  Same day CT head FINDINGS: Brain: No hydrocephalus. No extra-axial fluid collection. No mastoid. No mass lesion. Unchanged possible trace subarachnoid hemorrhage in the left sylvian fissure. No CT evidence of acute cortical infarct. There is sequela mild chronic microvascular ischemic change. Vascular: No hyperdense vessel or unexpected calcification. Skull: Soft tissue swelling along the right  lateral scalp. No evidence of an calvarial fracture. Sinuses/Orbits: Trace right mastoid effusion. No middle ear effusion. Paranasal sinuses are clear. Bilateral lens replacement. Orbits are otherwise unremarkable. Other: None. IMPRESSION: 1. Unchanged possible trace subarachnoid hemorrhage in the left Sylvian fissure. 2. Soft tissue swelling along the right lateral scalp. No evidence of an calvarial fracture. Electronically Signed   By: Lorenza Cambridge M.D.   On: 03/29/2023 16:59   CT Chest Wo Contrast  Result Date: 03/29/2023 CLINICAL DATA:  Unwitnessed fall.  Chest trauma. EXAM: CT CHEST WITHOUT CONTRAST TECHNIQUE: Multidetector CT imaging of the chest was performed following the standard protocol without IV contrast. RADIATION DOSE REDUCTION: This exam was performed according to the departmental dose-optimization program which includes automated exposure control, adjustment of the mA and/or kV according to patient size and/or use of iterative reconstruction technique. COMPARISON:  CTA chest dated 08/22/2022 FINDINGS: Cardiovascular: Normal heart size. No significant pericardial fluid/thickening. Great vessels are normal in course and caliber. Coronary artery calcifications and aortic atherosclerosis. Mediastinum/Nodes: Imaged thyroid gland without nodules meeting  criteria for imaging follow-up by size. Normal esophagus. No pathologically enlarged axillary, supraclavicular, mediastinal, or hilar lymph nodes. Lungs/Pleura: The central airways are patent. Similar middle lobe, lingular, and bilateral lower lobe scarring and traction bronchiectasis. No focal consolidation. No pneumothorax. No pleural effusion. Upper abdomen: Multifocal pancreatic hypodensities, for example 2.4 cm hypoattenuating focus arising from the pancreatic neck (4:126), better evaluated on prior CT abdomen examinations. Atrophic left kidney. Musculoskeletal: No acute or abnormal lytic or blastic osseous lesions. Please see separately  dictated CT thoracic spine report for detailed findings. Multilevel degenerative changes of the thoracic spine. Right axillary surgical clips. IMPRESSION: 1. No acute or traumatic intrathoracic abnormality. 2. Similar middle lobe, lingular, and bilateral lower lobe scarring and traction bronchiectasis. 3. Multifocal pancreatic hypodensities, better evaluated on prior CT abdomen examinations. 4. Aortic Atherosclerosis (ICD10-I70.0). Coronary artery calcifications. Assessment for potential risk factor modification, dietary therapy or pharmacologic therapy may be warranted, if clinically indicated. Electronically Signed   By: Agustin Cree M.D.   On: 03/29/2023 10:10   CT Cervical Spine Wo Contrast  Result Date: 03/29/2023 CLINICAL DATA:  Neck trauma (Age >= 65y). Fall with neck and back pain. EXAM: CT CERVICAL SPINE WITHOUT CONTRAST CT THORACIC SPINE WITHOUT CONTRAST TECHNIQUE: Multidetector CT imaging of the cervical and thoracic spine was performed without contrast. Multiplanar CT image reconstructions were also generated. RADIATION DOSE REDUCTION: This exam was performed according to the departmental dose-optimization program which includes automated exposure control, adjustment of the mA and/or kV according to patient size and/or use of iterative reconstruction technique. COMPARISON:  CT cervical spine 06/05/2022. FINDINGS: CT CERVICAL SPINE FINDINGS Alignment: Unchanged trace retrolisthesis of C3 on C4 and 3 mm anterolisthesis of C7 on T1. No traumatic malalignment. Skull base and vertebrae: No acute fracture. Normal craniocervical junction. No suspicious bone lesions. Soft tissues and spinal canal: No prevertebral fluid or swelling. No visible canal hematoma. Disc levels: Multilevel cervical spondylosis, worst at C3-4, where there is at least mild spinal canal stenosis. Upper chest: No acute findings. Other: Atherosclerotic calcifications of the carotid bulbs. CT THORACIC SPINE FINDINGS Alignment: Exaggerated  kyphosis of the upper thoracic spine. Vertebrae: Diffuse idiopathic skeletal hyperostosis. No acute fracture or suspicious bone lesion. Paraspinal and other soft tissues: Please refer to same-day chest CT report. Disc levels: No disc herniation, spinal canal stenosis or neural foraminal narrowing. IMPRESSION: 1. No acute fracture or traumatic malalignment of the cervical or thoracic spine. 2. Multilevel cervical spondylosis, worst at C3-4, where there is at least mild spinal canal stenosis. Electronically Signed   By: Orvan Falconer M.D.   On: 03/29/2023 10:08   CT T-SPINE NO CHARGE  Result Date: 03/29/2023 CLINICAL DATA:  Neck trauma (Age >= 65y). Fall with neck and back pain. EXAM: CT CERVICAL SPINE WITHOUT CONTRAST CT THORACIC SPINE WITHOUT CONTRAST TECHNIQUE: Multidetector CT imaging of the cervical and thoracic spine was performed without contrast. Multiplanar CT image reconstructions were also generated. RADIATION DOSE REDUCTION: This exam was performed according to the departmental dose-optimization program which includes automated exposure control, adjustment of the mA and/or kV according to patient size and/or use of iterative reconstruction technique. COMPARISON:  CT cervical spine 06/05/2022. FINDINGS: CT CERVICAL SPINE FINDINGS Alignment: Unchanged trace retrolisthesis of C3 on C4 and 3 mm anterolisthesis of C7 on T1. No traumatic malalignment. Skull base and vertebrae: No acute fracture. Normal craniocervical junction. No suspicious bone lesions. Soft tissues and spinal canal: No prevertebral fluid or swelling. No visible canal hematoma. Disc levels: Multilevel cervical spondylosis, worst at  C3-4, where there is at least mild spinal canal stenosis. Upper chest: No acute findings. Other: Atherosclerotic calcifications of the carotid bulbs. CT THORACIC SPINE FINDINGS Alignment: Exaggerated kyphosis of the upper thoracic spine. Vertebrae: Diffuse idiopathic skeletal hyperostosis. No acute fracture or  suspicious bone lesion. Paraspinal and other soft tissues: Please refer to same-day chest CT report. Disc levels: No disc herniation, spinal canal stenosis or neural foraminal narrowing. IMPRESSION: 1. No acute fracture or traumatic malalignment of the cervical or thoracic spine. 2. Multilevel cervical spondylosis, worst at C3-4, where there is at least mild spinal canal stenosis. Electronically Signed   By: Orvan Falconer M.D.   On: 03/29/2023 10:08   DG Knee Complete 4 Views Right  Result Date: 03/29/2023 CLINICAL DATA:  Fall.  Right knee pain. EXAM: RIGHT KNEE - COMPLETE 4+ VIEW COMPARISON:  None Available. FINDINGS: There is diffuse osteopenia of the visualized osseous structures. No acute fracture or dislocation. No aggressive osseous lesion. There are degenerative changes of the knee joint in the form of mildly reduced medial tibio-femoral compartment joint space, tibial spiking and osteophytosis. Note is also made of meniscal chondrocalcinosis. No knee effusion or focal soft tissue swelling. No radiopaque foreign bodies. IMPRESSION: *No acute osseous abnormality of the right knee joint. *Mild degenerative osteoarthritis. Electronically Signed   By: Jules Schick M.D.   On: 03/29/2023 08:59   CT HEAD WO CONTRAST  Result Date: 03/29/2023 CLINICAL DATA:  Head trauma, minor EXAM: CT HEAD WITHOUT CONTRAST TECHNIQUE: Contiguous axial images were obtained from the base of the skull through the vertex without intravenous contrast. RADIATION DOSE REDUCTION: This exam was performed according to the departmental dose-optimization program which includes automated exposure control, adjustment of the mA and/or kV according to patient size and/or use of iterative reconstruction technique. COMPARISON:  08/22/2022 FINDINGS: Brain: Small high-density area at the lower left sylvian fissure, more globular than expected for hyperdense MCA, although contiguous with the MCA. No reported MCA syndrome. No evidence of  acute infarct, hydrocephalus, mass, or collection. Vascular: Atheromatous calcification Skull: Right parietal scalp swelling without underlying fracture. Sinuses/Orbits: No evidence of injury Critical Value/emergent results were called by telephone at the time of interpretation on 03/29/2023 at 8:24 am to provider Jene Every , who verbally acknowledged these results. IMPRESSION: Tiny subarachnoid hemorrhage at the lower left sylvian fissure, likely traumatic, see reformats. Right parietal scalp hematoma without calvarial fracture. Electronically Signed   By: Tiburcio Pea M.D.   On: 03/29/2023 08:30    Assessment/Plan 1. Subarachnoid hemorrhage (HCC) After fall and hospitalization from 11/18-11/22. She is doing well with therapy and now stable to dc home  2. Episode of recurrent major depressive disorder, unspecified depression episode severity (HCC) -stable on home regimen, will continue at this time  3. Fall, subsequent encounter -fall precautions noted.  Continues PT/OT  4. Type 2 diabetes mellitus with stage 3b chronic kidney disease, without long-term current use of insulin (HCC) -diet controlled, continue dietary compliance, routine foot care/monitoring and to keep up with diabetic eye exams through ophthalmology   5. Benign essential hypertension -Blood pressure well controlled, goal bp <140/90 Continue current medications and dietary modifications follow metabolic panel  6. Diarrhea, unspecified type Stable, continues on benefiber daily  7. Stage 3b chronic kidney disease (HCC) -Chronic and stable Encourage proper hydration Follow metabolic panel Avoid nephrotoxic meds (NSAIDS)  8. Arthritis of both feet Continues on tylenol and voltaren gel PRN  pt is stable for discharge-will need PT/OT. no DME needed. Does not  need RX as she will be sent home with a weeks supply and has follow up PCP a few days after discharge.   Janene Harvey. Biagio Borg [facility] [phone]

## 2023-04-15 NOTE — Progress Notes (Signed)
A user error has taken place: encounter opened in error, closed for administrative reasons.

## 2023-04-22 IMAGING — CT NM PET TUM IMG RESTAG (PS) SKULL BASE T - THIGH
7 series · 25 of 25 positions shown · non-contrast
Comparison: Whole-body bone scan 09/02/2020.  PET-CT 10/04/2019

CLINICAL DATA: Subsequent treatment strategy for bilateral breast
cancer.

EXAM:
NUCLEAR MEDICINE PET SKULL BASE TO THIGH
TECHNIQUE: 7.0 mCi F-18 FDG was injected intravenously. Full-ring PET imaging
was performed from the skull base to thigh after the radiotracer. CT
data was obtained and used for attenuation correction and anatomic
localization.
Fasting blood glucose: 170 mg/dl

[Series 3: pet sk_thigh ac · axial · 5.0mm · 4.07mm/px · z∈[-950,-70]mm · 6 of 221 slices shown]
[im 1/221]
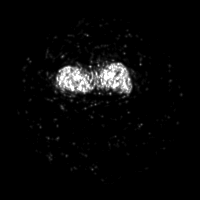
[im 45/221]
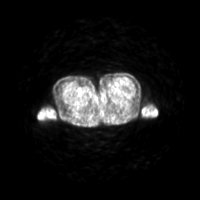
[im 89/221]
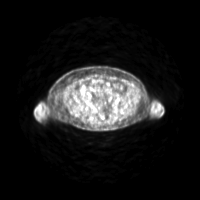
[im 133/221]
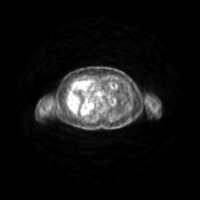
[im 177/221]
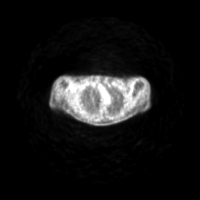
[im 221/221]
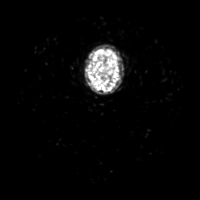

[Series 4: ct sk_thigh 5.0 br38 · axial · 5.0mm · 0.98mm/px · z∈[-950,-70]mm · 5 of 221 slices shown]
[im 1/221]
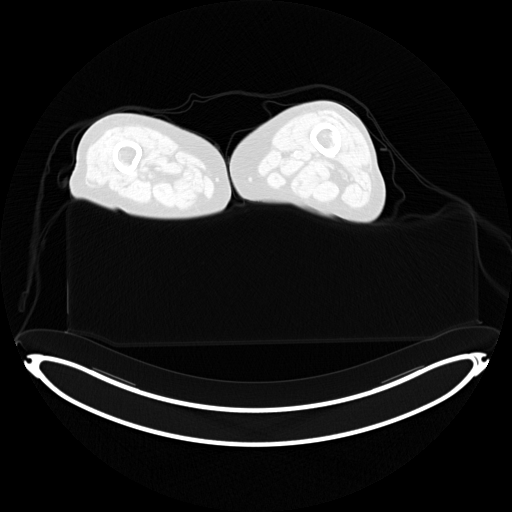
[im 56/221]
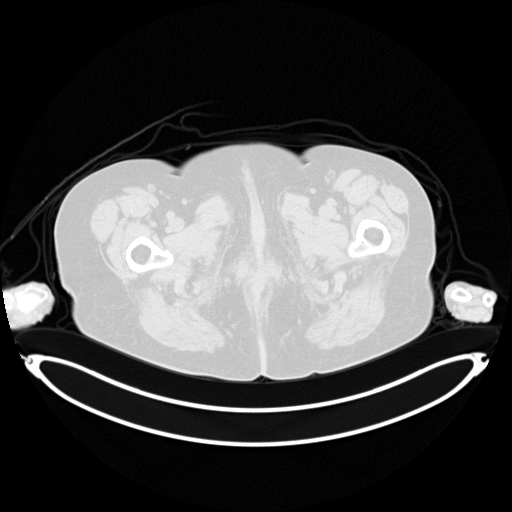
[im 111/221]
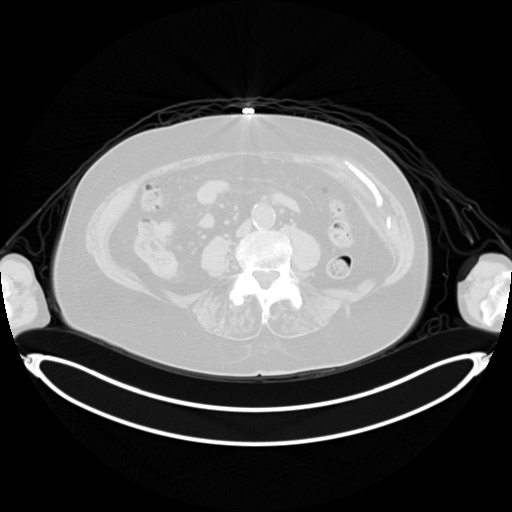
[im 166/221]
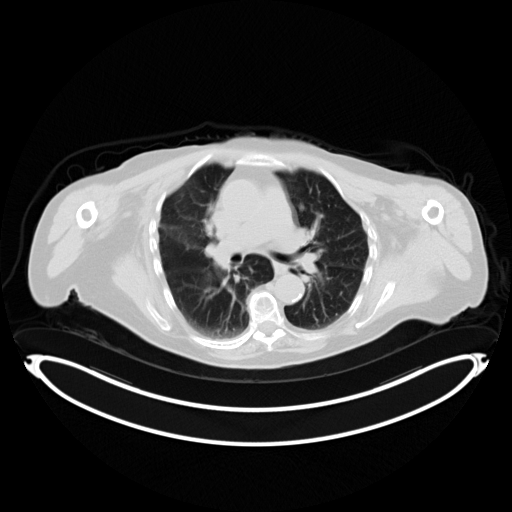
[im 221/221  brain]
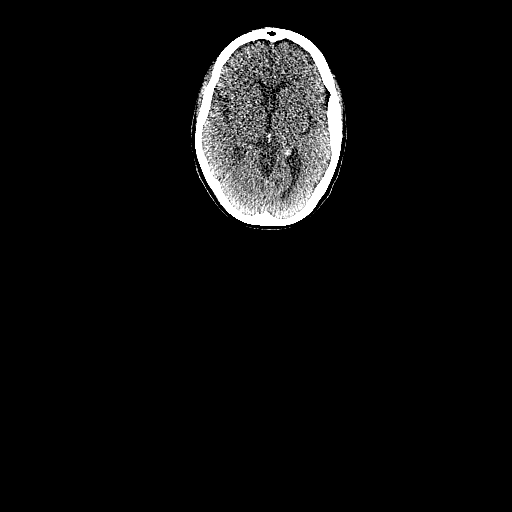

[Series 5: pet sk_thigh nac · axial · 5.0mm · 4.07mm/px · z∈[-950,-70]mm · 5 of 221 slices shown]
[im 1/221]
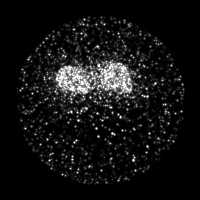
[im 56/221]
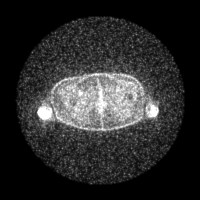
[im 111/221]
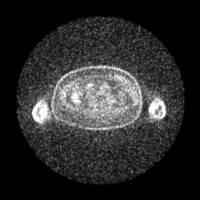
[im 166/221]
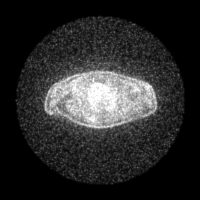
[im 221/221]
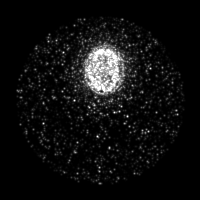

[Series 7: ct 5.0 bl57 lung_bone · axial · 5.0mm · 0.49mm/px · 1 of 47 slices shown]
[im 1/47  brain]
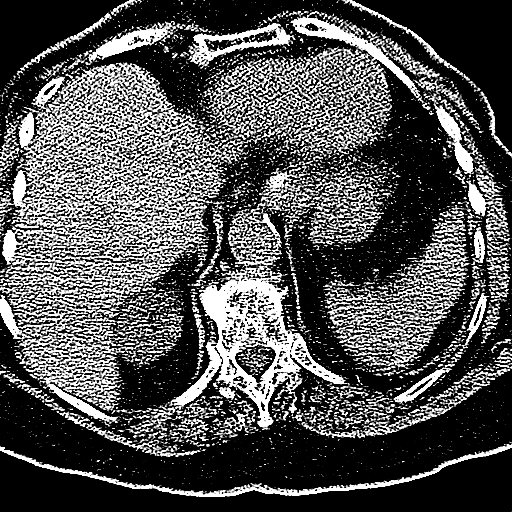

[Series 603: fused tra · 5 of 220 slices shown]
[im 1/220]
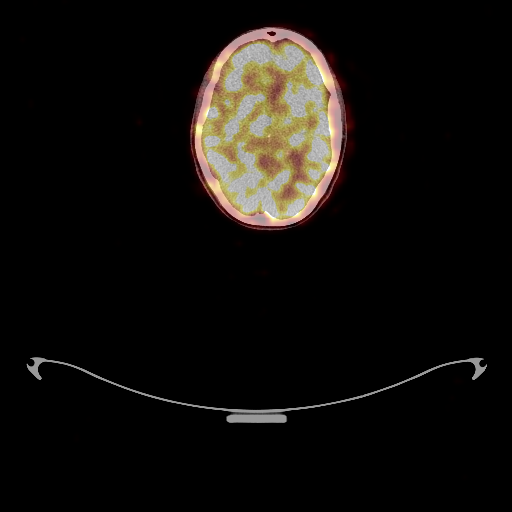
[im 55/220]
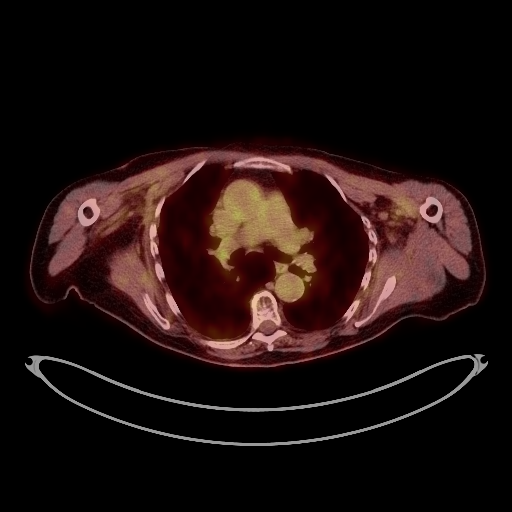
[im 110/220]
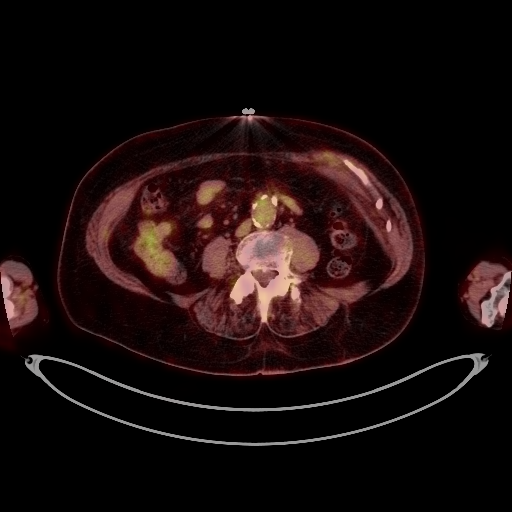
[im 165/220]
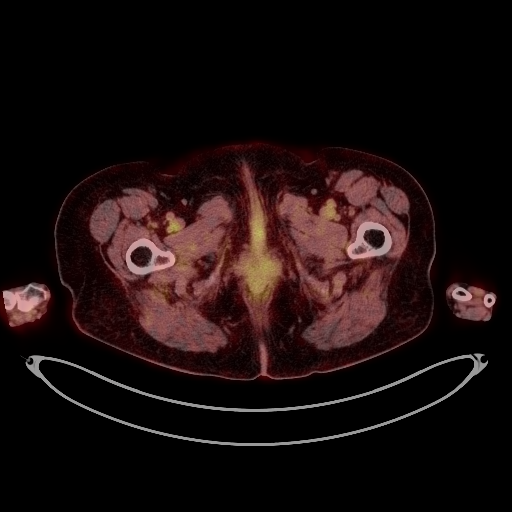
[im 220/220]
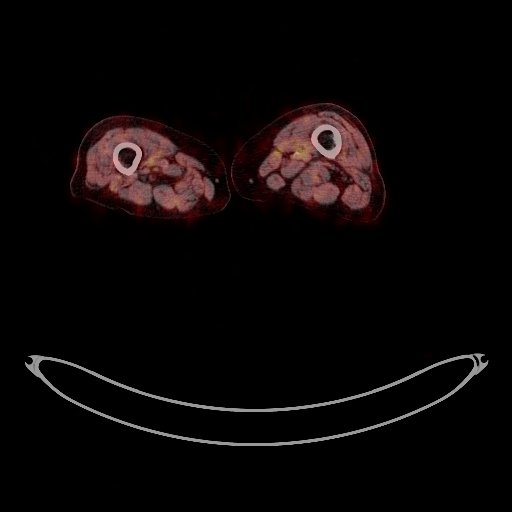

[Series 604: fused cor · 2 of 62 slices shown]
[im 1/62]
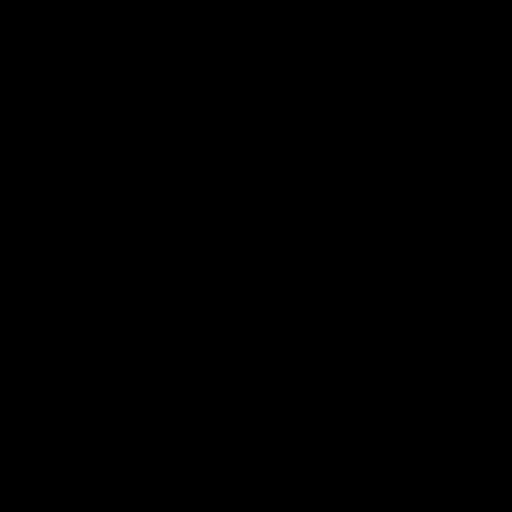
[im 62/62]
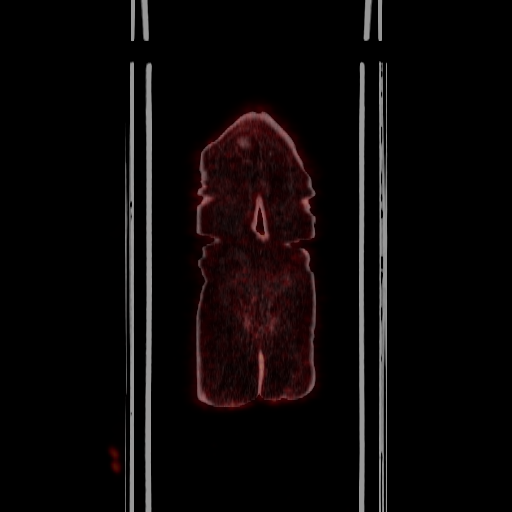

[Series 605: mip pet · coronal · 1.83mm/px · 1 of 32 slices shown]
[im 1/32]
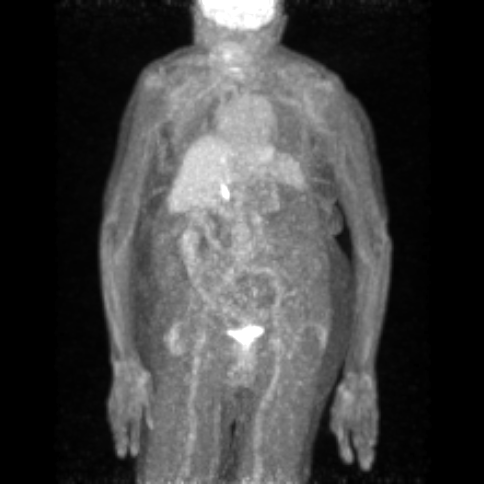

[25 of 25 positions shown; findings below may reference images not displayed]

FINDINGS: Mediastinal blood pool activity: SUV max

Liver activity: SUV max

NECK:

No hypermetabolic cervical lymph nodes are identified.There are no
lesions of the pharyngeal mucosal space.

Incidental CT findings: Bilateral carotid atherosclerosis.

CHEST:

Since previous CT, interval bilateral mastectomy and right axillary
node dissection. There are no hypermetabolic mediastinal, hilar,
axillary or internal mammary lymph nodes. No hypermetabolic
pulmonary activity or suspicious nodularity.

Incidental CT findings: Atherosclerosis of the aorta, great vessels
and coronary arteries. Stable mild emphysema and scattered mild
pulmonary scarring.

ABDOMEN/PELVIS:

Mildly heterogeneous hepatic activity with a questionable small
hypermetabolic lesion centrally in the dome of the right hepatic
lobe (SUV max 5.8). No definite corresponding lesion in this area on
the CT images. There is no hypermetabolic activity within the
spleen, pancreas or adrenal glands. There is no hypermetabolic nodal
activity.

Incidental CT findings: Previously demonstrated low-density
pancreatic lesions are grossly stable, without hypermetabolic
activity. The largest components measure 2.6 cm in the pancreatic
tail (image 89/4) and 2.3 cm in the pancreatic body (image 90/4).
Left renal cortical thinning. Infrarenal abdominal aortic aneurysm
measures 3.7 cm on image 107/4 (3.3 cm on previous PET-CT). Mild
distal colonic diverticulosis. Probable pelvic floor laxity.

SKELETON:

There is no hypermetabolic activity to suggest osseous metastatic
disease.

Incidental CT findings: Thoracic kyphosis
IMPRESSION: 1. Interval bilateral mastectomy and right axillary node dissection.
No evidence of chest wall recurrence or thoracic metastatic disease.
2. Questionable small hypermetabolic lesion centrally in the right
hepatic lobe (not definite). If it would change the patient's
management, consider further evaluation with abdominal MRI with and
without contrast. Otherwise attention on follow-up recommended.
3. No other evidence of metastatic disease.
4. Grossly stable known pancreatic lesions without hypermetabolic
activity.
5. Interval in mild enlargement of an abdominal aortic aneurysm,
measuring 3.7 cm. Recommend follow-up every 2 years. Reference: [HOSPITAL] 8250;[DATE].

## 2023-04-28 ENCOUNTER — Encounter: Payer: Self-pay | Admitting: Podiatry

## 2023-04-28 ENCOUNTER — Ambulatory Visit: Payer: Medicare PPO | Admitting: Podiatry

## 2023-04-28 DIAGNOSIS — M79674 Pain in right toe(s): Secondary | ICD-10-CM

## 2023-04-28 DIAGNOSIS — M79675 Pain in left toe(s): Secondary | ICD-10-CM | POA: Diagnosis not present

## 2023-04-28 DIAGNOSIS — B351 Tinea unguium: Secondary | ICD-10-CM | POA: Diagnosis not present

## 2023-04-28 DIAGNOSIS — D2371 Other benign neoplasm of skin of right lower limb, including hip: Secondary | ICD-10-CM

## 2023-04-28 DIAGNOSIS — D689 Coagulation defect, unspecified: Secondary | ICD-10-CM

## 2023-04-28 DIAGNOSIS — D2372 Other benign neoplasm of skin of left lower limb, including hip: Secondary | ICD-10-CM | POA: Diagnosis not present

## 2023-04-28 NOTE — Progress Notes (Signed)
Sarriah presents today for a chief complaint of painful elongated toenails and calluses bilaterally.  Objective: Vital signs are stable oriented x 3.  Pulses are palpable.  There is no erythema edema cellulitis drainage or odor.  Multiple benign skin lesions are noted toenails are long thick yellow dystrophic like mycotic painfully incurvated.  Assessment: Pain in limb secondary onychomycosis and benign skin lesions.  Plan: Debridement of benign skin lesion debridement of toenails 1 through 5 bilateral.

## 2023-05-06 ENCOUNTER — Inpatient Hospital Stay
Admission: EM | Admit: 2023-05-06 | Discharge: 2023-05-11 | DRG: 086 | Disposition: A | Discharge status: Skilled Nursing Facility | Payer: Medicare PPO | Source: Skilled Nursing Facility | Attending: Student | Admitting: Student

## 2023-05-06 ENCOUNTER — Emergency Department: Payer: Medicare PPO

## 2023-05-06 ENCOUNTER — Inpatient Hospital Stay: Payer: Medicare PPO

## 2023-05-06 ENCOUNTER — Encounter: Payer: Self-pay | Admitting: Emergency Medicine

## 2023-05-06 ENCOUNTER — Other Ambulatory Visit: Payer: Self-pay

## 2023-05-06 DIAGNOSIS — I150 Renovascular hypertension: Secondary | ICD-10-CM | POA: Diagnosis present

## 2023-05-06 DIAGNOSIS — Z794 Long term (current) use of insulin: Secondary | ICD-10-CM | POA: Diagnosis not present

## 2023-05-06 DIAGNOSIS — Z79811 Long term (current) use of aromatase inhibitors: Secondary | ICD-10-CM

## 2023-05-06 DIAGNOSIS — I4891 Unspecified atrial fibrillation: Secondary | ICD-10-CM | POA: Diagnosis present

## 2023-05-06 DIAGNOSIS — S066X0A Traumatic subarachnoid hemorrhage without loss of consciousness, initial encounter: Secondary | ICD-10-CM | POA: Diagnosis present

## 2023-05-06 DIAGNOSIS — S06A0XA Traumatic brain compression without herniation, initial encounter: Secondary | ICD-10-CM

## 2023-05-06 DIAGNOSIS — R402362 Coma scale, best motor response, obeys commands, at arrival to emergency department: Secondary | ICD-10-CM | POA: Diagnosis present

## 2023-05-06 DIAGNOSIS — I161 Hypertensive emergency: Secondary | ICD-10-CM | POA: Diagnosis not present

## 2023-05-06 DIAGNOSIS — S7002XA Contusion of left hip, initial encounter: Secondary | ICD-10-CM

## 2023-05-06 DIAGNOSIS — R402252 Coma scale, best verbal response, oriented, at arrival to emergency department: Secondary | ICD-10-CM | POA: Diagnosis present

## 2023-05-06 DIAGNOSIS — R296 Repeated falls: Secondary | ICD-10-CM | POA: Diagnosis present

## 2023-05-06 DIAGNOSIS — N2581 Secondary hyperparathyroidism of renal origin: Secondary | ICD-10-CM | POA: Diagnosis present

## 2023-05-06 DIAGNOSIS — Z9011 Acquired absence of right breast and nipple: Secondary | ICD-10-CM

## 2023-05-06 DIAGNOSIS — W19XXXA Unspecified fall, initial encounter: Secondary | ICD-10-CM | POA: Diagnosis not present

## 2023-05-06 DIAGNOSIS — Z888 Allergy status to other drugs, medicaments and biological substances status: Secondary | ICD-10-CM

## 2023-05-06 DIAGNOSIS — I160 Hypertensive urgency: Secondary | ICD-10-CM | POA: Diagnosis present

## 2023-05-06 DIAGNOSIS — M810 Age-related osteoporosis without current pathological fracture: Secondary | ICD-10-CM | POA: Diagnosis present

## 2023-05-06 DIAGNOSIS — S065XAA Traumatic subdural hemorrhage with loss of consciousness status unknown, initial encounter: Secondary | ICD-10-CM | POA: Diagnosis present

## 2023-05-06 DIAGNOSIS — E1140 Type 2 diabetes mellitus with diabetic neuropathy, unspecified: Secondary | ICD-10-CM | POA: Diagnosis present

## 2023-05-06 DIAGNOSIS — M109 Gout, unspecified: Secondary | ICD-10-CM | POA: Diagnosis present

## 2023-05-06 DIAGNOSIS — E1122 Type 2 diabetes mellitus with diabetic chronic kidney disease: Secondary | ICD-10-CM | POA: Diagnosis present

## 2023-05-06 DIAGNOSIS — S065X0A Traumatic subdural hemorrhage without loss of consciousness, initial encounter: Principal | ICD-10-CM

## 2023-05-06 DIAGNOSIS — C7951 Secondary malignant neoplasm of bone: Secondary | ICD-10-CM | POA: Diagnosis present

## 2023-05-06 DIAGNOSIS — G2581 Restless legs syndrome: Secondary | ICD-10-CM | POA: Diagnosis present

## 2023-05-06 DIAGNOSIS — Z853 Personal history of malignant neoplasm of breast: Secondary | ICD-10-CM

## 2023-05-06 DIAGNOSIS — Z8673 Personal history of transient ischemic attack (TIA), and cerebral infarction without residual deficits: Secondary | ICD-10-CM

## 2023-05-06 DIAGNOSIS — F32A Depression, unspecified: Secondary | ICD-10-CM | POA: Diagnosis present

## 2023-05-06 DIAGNOSIS — Y92009 Unspecified place in unspecified non-institutional (private) residence as the place of occurrence of the external cause: Secondary | ICD-10-CM

## 2023-05-06 DIAGNOSIS — Z803 Family history of malignant neoplasm of breast: Secondary | ICD-10-CM

## 2023-05-06 DIAGNOSIS — D631 Anemia in chronic kidney disease: Secondary | ICD-10-CM | POA: Diagnosis present

## 2023-05-06 DIAGNOSIS — J439 Emphysema, unspecified: Secondary | ICD-10-CM | POA: Diagnosis present

## 2023-05-06 DIAGNOSIS — Z7902 Long term (current) use of antithrombotics/antiplatelets: Secondary | ICD-10-CM

## 2023-05-06 DIAGNOSIS — E7800 Pure hypercholesterolemia, unspecified: Secondary | ICD-10-CM | POA: Diagnosis present

## 2023-05-06 DIAGNOSIS — F419 Anxiety disorder, unspecified: Secondary | ICD-10-CM | POA: Diagnosis present

## 2023-05-06 DIAGNOSIS — S8002XA Contusion of left knee, initial encounter: Secondary | ICD-10-CM

## 2023-05-06 DIAGNOSIS — I110 Hypertensive heart disease with heart failure: Secondary | ICD-10-CM | POA: Diagnosis present

## 2023-05-06 DIAGNOSIS — Z880 Allergy status to penicillin: Secondary | ICD-10-CM

## 2023-05-06 DIAGNOSIS — Z66 Do not resuscitate: Secondary | ICD-10-CM | POA: Diagnosis present

## 2023-05-06 DIAGNOSIS — I252 Old myocardial infarction: Secondary | ICD-10-CM | POA: Diagnosis not present

## 2023-05-06 DIAGNOSIS — N1832 Chronic kidney disease, stage 3b: Secondary | ICD-10-CM | POA: Diagnosis present

## 2023-05-06 DIAGNOSIS — N183 Chronic kidney disease, stage 3 unspecified: Secondary | ICD-10-CM | POA: Diagnosis not present

## 2023-05-06 DIAGNOSIS — Z79899 Other long term (current) drug therapy: Secondary | ICD-10-CM

## 2023-05-06 DIAGNOSIS — Z85828 Personal history of other malignant neoplasm of skin: Secondary | ICD-10-CM

## 2023-05-06 DIAGNOSIS — Z823 Family history of stroke: Secondary | ICD-10-CM

## 2023-05-06 DIAGNOSIS — Z8249 Family history of ischemic heart disease and other diseases of the circulatory system: Secondary | ICD-10-CM

## 2023-05-06 DIAGNOSIS — E1165 Type 2 diabetes mellitus with hyperglycemia: Secondary | ICD-10-CM | POA: Diagnosis present

## 2023-05-06 DIAGNOSIS — R402142 Coma scale, eyes open, spontaneous, at arrival to emergency department: Secondary | ICD-10-CM | POA: Diagnosis present

## 2023-05-06 DIAGNOSIS — I5032 Chronic diastolic (congestive) heart failure: Secondary | ICD-10-CM | POA: Diagnosis present

## 2023-05-06 DIAGNOSIS — Z7982 Long term (current) use of aspirin: Secondary | ICD-10-CM

## 2023-05-06 DIAGNOSIS — S062XAA Diffuse traumatic brain injury with loss of consciousness status unknown, initial encounter: Secondary | ICD-10-CM | POA: Diagnosis not present

## 2023-05-06 DIAGNOSIS — E872 Acidosis, unspecified: Secondary | ICD-10-CM | POA: Diagnosis present

## 2023-05-06 DIAGNOSIS — R54 Age-related physical debility: Secondary | ICD-10-CM | POA: Diagnosis present

## 2023-05-06 DIAGNOSIS — Z515 Encounter for palliative care: Secondary | ICD-10-CM | POA: Diagnosis not present

## 2023-05-06 DIAGNOSIS — W010XXA Fall on same level from slipping, tripping and stumbling without subsequent striking against object, initial encounter: Secondary | ICD-10-CM | POA: Diagnosis present

## 2023-05-06 LAB — PROTIME-INR
INR: 1 (ref 0.8–1.2)
Prothrombin Time: 13 s (ref 11.4–15.2)

## 2023-05-06 LAB — BASIC METABOLIC PANEL
Anion gap: 10 (ref 5–15)
BUN: 34 mg/dL — ABNORMAL HIGH (ref 8–23)
CO2: 19 mmol/L — ABNORMAL LOW (ref 22–32)
Calcium: 9.5 mg/dL (ref 8.9–10.3)
Chloride: 107 mmol/L (ref 98–111)
Creatinine, Ser: 1.31 mg/dL — ABNORMAL HIGH (ref 0.44–1.00)
GFR, Estimated: 38 mL/min — ABNORMAL LOW (ref >60)
Glucose, Bld: 153 mg/dL — ABNORMAL HIGH (ref 70–99)
Potassium: 4.3 mmol/L (ref 3.5–5.1)
Sodium: 136 mmol/L (ref 135–145)

## 2023-05-06 LAB — CBC WITH DIFFERENTIAL/PLATELET
Abs Immature Granulocytes: 0.04 10*3/uL (ref 0.00–0.07)
Basophils Absolute: 0.1 10*3/uL (ref 0.0–0.1)
Basophils Relative: 1 %
Eosinophils Absolute: 0.1 10*3/uL (ref 0.0–0.5)
Eosinophils Relative: 2 %
HCT: 33.2 % — ABNORMAL LOW (ref 36.0–46.0)
Hemoglobin: 11.1 g/dL — ABNORMAL LOW (ref 12.0–15.0)
Immature Granulocytes: 1 %
Lymphocytes Relative: 27 %
Lymphs Abs: 2.1 10*3/uL (ref 0.7–4.0)
MCH: 31.4 pg (ref 26.0–34.0)
MCHC: 33.4 g/dL (ref 30.0–36.0)
MCV: 94.1 fL (ref 80.0–100.0)
Monocytes Absolute: 0.7 10*3/uL (ref 0.1–1.0)
Monocytes Relative: 9 %
Neutro Abs: 4.8 10*3/uL (ref 1.7–7.7)
Neutrophils Relative %: 60 %
Platelets: 218 10*3/uL (ref 150–400)
RBC: 3.53 MIL/uL — ABNORMAL LOW (ref 3.87–5.11)
RDW: 13.6 % (ref 11.5–15.5)
WBC: 7.7 10*3/uL (ref 4.0–10.5)
nRBC: 0 % (ref 0.0–0.2)

## 2023-05-06 LAB — URINALYSIS, ROUTINE W REFLEX MICROSCOPIC
Bacteria, UA: NONE SEEN
Bilirubin Urine: NEGATIVE
Glucose, UA: NEGATIVE mg/dL
Hgb urine dipstick: NEGATIVE
Ketones, ur: NEGATIVE mg/dL
Nitrite: NEGATIVE
Protein, ur: 30 mg/dL — AB
Specific Gravity, Urine: 1.011 (ref 1.005–1.030)
pH: 5 (ref 5.0–8.0)

## 2023-05-06 MED ORDER — ACETAMINOPHEN 160 MG/5ML PO SOLN: 650.0000 mg | ORAL | Status: DC | PRN

## 2023-05-06 MED ORDER — PANTOPRAZOLE SODIUM 40 MG IV SOLR
40.0000 mg | Freq: Every day | INTRAVENOUS | Status: DC
  Administered 2023-05-07 – 2023-05-09 (×4): 40 mg via INTRAVENOUS
  Filled 2023-05-06 (×4): qty 10

## 2023-05-06 MED ORDER — STROKE: EARLY STAGES OF RECOVERY BOOK: Freq: Once | Status: DC

## 2023-05-06 MED ORDER — CLEVIDIPINE BUTYRATE 0.5 MG/ML IV EMUL
0.0000 mg/h | INTRAVENOUS | Status: DC
  Administered 2023-05-06: 2 mg/h via INTRAVENOUS
  Administered 2023-05-07: 8 mg/h via INTRAVENOUS
  Administered 2023-05-07: 4 mg/h via INTRAVENOUS
  Administered 2023-05-07: 2 mg/h via INTRAVENOUS
  Filled 2023-05-06 (×5): qty 50

## 2023-05-06 MED ORDER — ACETAMINOPHEN 650 MG RE SUPP: 650.0000 mg | RECTAL | Status: DC | PRN

## 2023-05-06 MED ORDER — ACETAMINOPHEN 325 MG PO TABS
650.0000 mg | ORAL_TABLET | ORAL | Status: DC | PRN
  Administered 2023-05-06 – 2023-05-10 (×9): 650 mg via ORAL
  Filled 2023-05-06 (×9): qty 2

## 2023-05-06 MED ORDER — SENNOSIDES-DOCUSATE SODIUM 8.6-50 MG PO TABS
1.0000 | ORAL_TABLET | Freq: Two times a day (BID) | ORAL | Status: DC
  Administered 2023-05-07 – 2023-05-08 (×2): 1 via ORAL
  Filled 2023-05-06 (×3): qty 1

## 2023-05-06 NOTE — ED Notes (Signed)
Purwick placed on pt at this time.

## 2023-05-06 NOTE — ED Notes (Signed)
Pt to bedside commode x2 assist. Pt has urine occurrence. Brief changed and given blanket for comfort.

## 2023-05-06 NOTE — ED Notes (Signed)
RN in room to titrate clevi gtt. Pt not in room at this time and RN not notified as to patient transport.

## 2023-05-06 NOTE — ED Triage Notes (Signed)
Pt here via [facility] from home with left knee pain. Pt had a fall on christmas eve that is getting worse. Pt having swelling. Pt states it is painful to stand. Pt also has a lot of bruising on her left big toe as well. Pt normally drags her left leg and has bruising up her left leg which is also normal. Pt having a foul odor, no hx of UTI. Hx of htn.  76 98% RA 198/86

## 2023-05-06 NOTE — ED Provider Notes (Signed)
[facility] Provider Note    Event Date/Time   First MD Initiated Contact with Patient 05/06/23 1023     (approximate)   History   Fall   HPI  Tina Curtis is a 89 y.o. female with a past medical history of heart failure, HTN, CVA on dual antiplatelet therapy, emphysema, breast cancer who presents today for evaluation after a fall that occurred 2 days ago.  Patient reports that she was putting eggs into a bowl when her to be a fell onto the ground and she leaned over to pick it up and she lost her balance and fell onto her left side.  She does not think that she struck her head but did not lose consciousness.  She is uncertain how she got back up.  She did not have any vomiting.  She reports that she has had pain to her left leg ever since and reports that she is "dragging it" while walking.  She reports that she has a headache, but mostly has pain in her left knee.  Patient Active Problem List   Diagnosis Date Noted   Subdural hematoma (HCC) 05/06/2023   Cancer, metastatic to bone (HCC) 04/05/2023   Temporal arteritis (HCC) 04/05/2023   Fall 03/29/2023   Bacteremia due to Streptococcus 08/31/2022   Sepsis due to Streptococcus species with acute hypoxic respiratory failure without septic shock (HCC) 08/27/2022   CHF NYHA class III, acute, diastolic (HCC) 08/27/2022   Hematemesis 08/26/2022   NSTEMI (non-ST elevated myocardial infarction) (HCC) 08/26/2022   Hypertensive urgency 08/26/2022   Hypokalemia 08/26/2022   Hypocalcemia 08/26/2022   Chronic diastolic CHF (congestive heart failure) (HCC) 08/26/2022   Acute blood loss anemia 08/26/2022   Hypomagnesemia 08/26/2022   Hypophosphatemia 08/26/2022   Type II diabetes mellitus with renal manifestations (HCC) 08/26/2022   Rash 08/26/2022   Chest pain 08/26/2022   Acute on chronic respiratory failure with hypoxia (HCC) 06/10/2022   Emphysema lung (HCC) 06/10/2022   Pneumonia due to infectious organism  06/07/2022   History of breast cancer 06/06/2022   Depression 06/06/2022   Orthostatic hypotension 06/06/2022   Dehydration 06/06/2022   Dizziness and giddiness 06/02/2022   Osteoporosis of forearm 03/28/2020   Breast cancer (HCC) 10/13/2019   Carcinoma of upper-inner quadrant of right breast in female, estrogen receptor positive (HCC) 09/27/2019   Anemia in chronic kidney disease 02/01/2019   Atherosclerosis of renal artery (HCC) 02/01/2019   Benign hypertensive kidney disease with chronic kidney disease 02/01/2019   Hyposmolality and/or hyponatremia 02/01/2019   Proteinuria 02/01/2019   Secondary hyperparathyroidism of renal origin (HCC) 02/01/2019   Anemia of chronic renal failure, stage 3b (HCC) 04/01/2018   Renovascular hypertension 04/01/2018   Renal artery stenosis (HCC) 04/01/2018   Nausea & vomiting 03/05/2018   AKI (acute kidney injury) (HCC) 02/17/2018   Acquired hammer toe of right foot 08/14/2014   Bunion of right foot 08/14/2014   Gout 08/14/2014   History of Clostridium difficile colitis 08/14/2014   Pure hypercholesterolemia 08/14/2014   RLS (restless legs syndrome) 08/14/2014   Hallux rigidus of right foot 04/24/2014   Primary osteoarthritis of foot 09/01/2013   Fatigue 09/30/2012   Gluteus medius or minimus syndrome 08/23/2012   Trochanteric bursitis 08/12/2012   Benign essential hypertension 09/03/2011   Diverticulosis of colon 09/03/2011   Osteopenia 09/03/2011   Enthesopathy of ankle and tarsus 04/13/2011   Osteoarthritis of spine with radiculopathy, lumbosacral region 09/30/2010   Allergic rhinitis 10/31/1928  Physical Exam   Triage Vital Signs: ED Triage Vitals  Encounter Vitals Group     BP 05/06/23 0949 (!) 173/69     Systolic BP Percentile --      Diastolic BP Percentile --      Pulse Rate 05/06/23 0949 77     Resp 05/06/23 0949 18     Temp 05/06/23 0949 97.6 F (36.4 C)     Temp Source 05/06/23 0949 Oral     SpO2 05/06/23 0949  97 %     Weight 05/06/23 0950 139 lb (63 kg)     Height 05/06/23 0950 5\' 2"  (1.575 m)     Head Circumference --      Peak Flow --      Pain Score 05/06/23 0950 8     Pain Loc --      Pain Education --      Exclude from Growth Chart --     Most recent vital signs: Vitals:   05/06/23 1210 05/06/23 1357  BP: (!) 158/69 130/89  Pulse: 74 85  Resp: 18 20  Temp: 98 F (36.7 C) 98.4 F (36.9 C)  SpO2: 99% 98%    Physical Exam Vitals and nursing note reviewed.  Constitutional:      General: Awake and alert. No acute distress.    Appearance: Normal appearance. The patient is normal weight.  HENT:     Head: Normocephalic and atraumatic.     Mouth: Mucous membranes are moist.  Eyes:     General: PERRL. Normal EOMs        Right eye: No discharge.        Left eye: No discharge.     Conjunctiva/sclera: Conjunctivae normal.  Cardiovascular:     Rate and Rhythm: Normal rate and regular rhythm.     Pulses: Normal pulses.  Pulmonary:     Effort: Pulmonary effort is normal. No respiratory distress.     Breath sounds: Normal breath sounds.  Abdominal:     Abdomen is soft. There is no abdominal tenderness. No rebound or guarding. No distention. Musculoskeletal:        General: No swelling. Normal range of motion.     Cervical back: Normal range of motion and neck supple. No midline cervical spine tenderness.  Full range of motion of neck.  Negative Spurling test.  Negative Lhermitte sign.  Normal strength and sensation in bilateral upper extremities. Normal grip strength bilaterally.  Normal intrinsic muscle function of the hand bilaterally.  Normal radial pulses bilaterally. Ecchymosis to left lateral upper and lower leg with TTP to hip and knee. Able to actively range bilateral hips and knees. Normal pedal pulses. Compartments soft and compressible throughout. Skin:    General: Skin is warm and dry.     Capillary Refill: Capillary refill takes less than 2 seconds.     Findings: No  rash.  Neurological:     Mental Status: The patient is awake and alert. At mental baseline  Neurological: GCS 15 alert and oriented x3 Normal speech, no expressive or receptive aphasia or dysarthria Cranial nerves II through XII intact Normal visual fields 5 out of 5 strength in upper extremities with intact sensation throughout, normal strength to RLE. Able to move her LLE but complains of severe pain. Unable to plantarflex or dorsiflex at left ankle No extremity drift Normal finger-to-nose testing, no limb or truncal ataxia    ED Results / Procedures / Treatments   Labs (all labs ordered are listed,  but only abnormal results are displayed) Labs Reviewed  BASIC METABOLIC PANEL - Abnormal; Notable for the following components:      Result Value   CO2 19 (*)    Glucose, Bld 153 (*)    BUN 34 (*)    Creatinine, Ser 1.31 (*)    GFR, Estimated 38 (*)    All other components within normal limits  CBC WITH DIFFERENTIAL/PLATELET - Abnormal; Notable for the following components:   RBC 3.53 (*)    Hemoglobin 11.1 (*)    HCT 33.2 (*)    All other components within normal limits  URINALYSIS, ROUTINE W REFLEX MICROSCOPIC - Abnormal; Notable for the following components:   Color, Urine STRAW (*)    APPearance CLEAR (*)    Protein, ur 30 (*)    Leukocytes,Ua TRACE (*)    All other components within normal limits  PROTIME-INR     EKG     RADIOLOGY I independently reviewed and interpreted imaging and agree with radiologists findings.     PROCEDURES:  Critical Care performed:   .Critical Care  Performed by: Jackelyn Hoehn, PA-C Authorized by: Jackelyn Hoehn, PA-C   Critical care provider statement:    Critical care time (minutes):  45   Critical care was necessary to treat or prevent imminent or life-threatening deterioration of the following conditions:  CNS failure or compromise   Critical care was time spent personally by me on the following activities:  Development  of treatment plan with patient or surrogate, discussions with consultants, evaluation of patient's response to treatment, examination of patient, ordering and review of laboratory studies, ordering and review of radiographic studies, ordering and performing treatments and interventions, pulse oximetry, re-evaluation of patient's condition, review of old charts and obtaining history from patient or surrogate   I assumed direction of critical care for this patient from another provider in my specialty: no     Care discussed with: admitting provider      MEDICATIONS ORDERED IN ED: Medications   stroke: early stages of recovery book (has no administration in time range)  acetaminophen (TYLENOL) tablet 650 mg (has no administration in time range)    Or  acetaminophen (TYLENOL) 160 MG/5ML solution 650 mg (has no administration in time range)    Or  acetaminophen (TYLENOL) suppository 650 mg (has no administration in time range)  senna-docusate (Senokot-S) tablet 1 tablet (has no administration in time range)  pantoprazole (PROTONIX) injection 40 mg (has no administration in time range)  clevidipine (CLEVIPREX) infusion 0.5 mg/mL (has no administration in time range)     IMPRESSION / MDM / ASSESSMENT AND PLAN / ED COURSE  I reviewed the triage vital signs and the nursing notes.   Differential diagnosis includes, but is not limited to, hip fracture, knee fracture, contusion, hematoma, intracranial hemorrhage, cervical spine injury.  I reviewed the patient's chart.  Patient was hospitalized 1 month ago after a fall in which she sustained a trace subarachnoid hemorrhage, and had head CT x 2 with 6-hour spacing without expansion of bleed.  Neurosurgery recommended holding off on her dual antiplatelet therapy for 1 week.  She was ultimately discharged.  Patient is awake and alert, hemodynamically stable.  She is oriented and at her baseline.  She has her good friend at her bedside who agrees that she  is at her normal mental baseline.  X-ray of her knee obtained in triage was negative for any acute findings.  X-ray of her hip and femur  also added given her obvious trauma and ecchymosis, though these were negative as well.  CT pelvis obtained for evaluation of possible occult fracture that this was also negative.  She has no abdominal tenderness or hemodynamic instability to suggest visceral organ injury.  No chest wall pain or ecchymosis to suggest rib fracture or pneumothorax.  Compartments are soft and compressible in all 4 extremities, I do not suspect compartment syndrome.    CT head and neck obtained per [place] criteria.  I was called by the radiologist who reports that she has a 2 cm mostly acute subdural hematoma with mass effect and 5 mm midline shift.  This was immediately discussed with Dr. Katrinka Blazing who has requested admission to the ICU and repeat CT scan in 6 hours.  He recommends blood pressure control if blood pressure exceeds 160 systolic.  Patient's current blood pressure is 158/69.  Dr. Katrinka Blazing does not feel that patient requires antiepileptics or steroids at this time.   I discussed these findings with the patient and her friend at the bedside who are in agreement with the plan.  Patient remains at her mental baseline.  Case was discussed with the ICU team who was agreed to accept the patient.  Patient's presentation is most consistent with acute presentation with potential threat to life or bodily function.   Clinical Course as of 05/06/23 1449  Thu May 06, 2023  1149 Sdh 2cm, acute. Mass effect with 5mm shift [JP]  1200 Per Dr. Katrinka Blazing, repeat head CT 6 hours, ICU admit now, no antiepileptics, keep blood pressure below 160 systolic [JP]  1201 No antiepileptics per Dr. Katrinka Blazing [JP]  1217 ICU paged [JP]  1239 Secretary also paged ICU [JP]  1315 Secure chat sent to ICU APP, still no response from ICU [JP]  1328 Pt accepted by ICU team [JP]    Clinical Course User Index [JP] Trenita Hulme,  Herb Grays, PA-C     FINAL CLINICAL IMPRESSION(S) / ED DIAGNOSES   Final diagnoses:  Traumatic subdural hematoma without loss of consciousness, initial encounter (HCC)  Midline shift of brain due to hematoma [facility])  Fall, initial encounter  Contusion of left hip, initial encounter  Contusion of left knee, initial encounter     Rx / DC Orders   ED Discharge Orders     None        Note:  This document was prepared using Dragon voice recognition software and may include unintentional dictation errors.   Jackelyn Hoehn, PA-C 05/06/23 1449    Janith Lima, MD 05/06/23 [phone]

## 2023-05-06 NOTE — Progress Notes (Signed)
PT Cancellation Note  Patient Details Name: Tina Curtis MRN: [id number] [name]: 1927-01-24   Cancelled Treatment:    Reason Eval/Treat Not Completed: Medical issues which prohibited therapy (Consult received and chart reviewed. Patient admitted with acute SDH; on bedrest x24 hours with plans for repeat imaging.  Will hold therapy consult at this time.  Will continue to follow and initiate as medically appropriate.)  Tina Curtis, PT, DPT, NCS 05/06/23, 2:37 PM [phone]

## 2023-05-06 NOTE — ED Notes (Signed)
Was able to collect clear yellow urine.  Used bedpan and cleaned peri area prior to voiding.

## 2023-05-06 NOTE — ED Notes (Signed)
Pateint got on bedpan and voided.  After she got off she says she has leg cramps, and says she just hurts all over.  We pulled her up and repositioned her with pillows and will give tylenol to see if that helps.

## 2023-05-06 NOTE — ED Triage Notes (Signed)
See First nurse note.

## 2023-05-06 NOTE — Consult Note (Signed)
Consulting Department:  Emergency department  Primary Physician:  [name], MD  Chief Complaint: Subdural hematoma  History of Present Illness: 05/06/2023 Tina Curtis is a 89 y.o. female who presents with the chief complaint of subdural hematoma.  She has had multiple recent falls.  She was previously hospitalized with a head injury around a month and a half ago.  Struck the right side of her head and states that she has a large lump on it.  Has had a slight headache ever since and is also had left lower extremity weakness ever since.  She comes in for evaluation after a fall yesterday when she was leaning over to try to clean up a small mass while cooking.  She states that she then fell and struck her head again.  She does have a mild headache, does not feel like she has any worse in terms of progressive weakness but does state that her left lower extremity has felt heavy and weak for over a month.  She has had a AFO brace ordered for her at her facility.  Review of Systems:  A 10 point review of systems is negative, except for the pertinent positives and negatives detailed in the HPI.  Past Medical History: Past Medical History:  Diagnosis Date   Arthritis    Gout   Chronic kidney disease    Diarrhea    Hypercholesteremia    Hypertension    Neuropathy    feet and lower legs   Seasonal allergies    Skin cancer    face    Past Surgical History: Past Surgical History:  Procedure Laterality Date   ABDOMINAL HYSTERECTOMY  2000   APPENDECTOMY     BREAST BIOPSY Right 09/19/2019   affirm bx of calcs UOQ, x marker, path pending   BREAST BIOPSY Right 09/19/2019   [place] bx of mass,heart marker, path pending   BREAST BIOPSY Right 09/19/2019   [place] bx of LN, coil marker, path pending   CATARACT EXTRACTION Left    CATARACT EXTRACTION W/PHACO Right 10/24/2014   Procedure: CATARACT EXTRACTION PHACO AND INTRAOCULAR LENS PLACEMENT (IOC);  Surgeon: Lockie Mola, MD;  Location:  [facility];  Service: Ophthalmology;  Laterality: Right;   ESOPHAGOGASTRODUODENOSCOPY N/A 03/07/2018   Procedure: ESOPHAGOGASTRODUODENOSCOPY (EGD);  Surgeon: Toney Reil, MD;  Location: [facility];  Service: Gastroenterology;  Laterality: N/A;   MASTECTOMY MODIFIED RADICAL Right 10/13/2019   Procedure: MASTECTOMY MODIFIED RADICAL;  Surgeon: Earline Mayotte, MD;  Location: [facility];  Service: General;  Laterality: Right;   RENAL ANGIOGRAPHY Right 04/11/2018   Procedure: RENAL ANGIOGRAPHY;  Surgeon: Annice Needy, MD;  Location: [facility];  Service: Cardiovascular;  Laterality: Right;   SIMPLE MASTECTOMY WITH AXILLARY SENTINEL NODE BIOPSY Left 10/13/2019   Procedure: SIMPLE MASTECTOMY TRUE CUT BIOPSY, SENTINEL NODE BIOPSY;  Surgeon: Earline Mayotte, MD;  Location: [facility];  Service: General;  Laterality: Left;    Allergies: Allergies as of 05/06/2023 - Review Complete 05/06/2023  Allergen Reaction Noted   Penicillins Anaphylaxis 10/15/2014   Drug ingredient [zinc]  09/27/2019   Empagliflozin Rash 09/16/2022    Medications:  Current Facility-Administered Medications:    [START ON 05/07/2023]  stroke: early stages of recovery book, , Does not apply, Once, Ezequiel Essex, NP   acetaminophen (TYLENOL) tablet 650 mg, 650 mg, Oral, Q4H PRN, 650 mg at 05/06/23 1520 **OR** acetaminophen (TYLENOL) 160 MG/5ML solution 650 mg, 650 mg, Per Tube, Q4H PRN **OR** acetaminophen (TYLENOL)  suppository 650 mg, 650 mg, Rectal, Q4H PRN, Ezequiel Essex, NP   clevidipine (CLEVIPREX) infusion 0.5 mg/mL, 0-21 mg/hr, Intravenous, Continuous, Ezequiel Essex, NP, Last Rate: 4 mL/hr at 05/06/23 1610, 2 mg/hr at 05/06/23 1610   pantoprazole (PROTONIX) injection 40 mg, 40 mg, Intravenous, QHS, Ezequiel Essex, NP   senna-docusate (Senokot-S) tablet 1 tablet, 1 tablet, Oral, BID, Ezequiel Essex, NP  Current Outpatient Medications:    acetaminophen (TYLENOL) 325 MG tablet, Take 650 mg by  mouth 3 (three) times daily., Disp: , Rfl:    amLODipine (NORVASC) 5 MG tablet, Take 1 tablet (5 mg total) by mouth every evening. Skip the dose if SBP less than 140 mmHg, Disp: , Rfl:    anastrozole (ARIMIDEX) 1 MG tablet, Take 1 tablet (1 mg total) by mouth daily., Disp: 90 tablet, Rfl: 1   aspirin EC 81 MG tablet, Take 1 tablet (81 mg total) by mouth daily., Disp: , Rfl:    calcitRIOL (ROCALTROL) 0.25 MCG capsule, Take 0.25 mcg by mouth as directed. Monday,Wednesday, Friday, Disp: , Rfl:    calcium carbonate (OS-CAL - DOSED IN MG OF ELEMENTAL CALCIUM) 1250 (500 Ca) MG tablet, Take 1 tablet by mouth 2 (two) times daily with a meal., Disp: , Rfl:    citalopram (CELEXA) 10 MG tablet, Take 10 mg by mouth daily. , Disp: , Rfl:    clopidogrel (PLAVIX) 75 MG tablet, Take 1 tablet (75 mg total) by mouth daily., Disp: , Rfl:    cyanocobalamin (VITAMIN B12) 1000 MCG tablet, Take 1,000 mcg by mouth daily., Disp: , Rfl:    diclofenac Sodium (VOLTAREN) 1 % GEL, Apply 2 g topically every 6 (six) hours. To bilateral legs., Disp: , Rfl:    docusate sodium (COLACE) 100 MG capsule, Take 100 mg by mouth daily as needed for mild constipation., Disp: , Rfl:    DULoxetine (CYMBALTA) 30 MG capsule, Take 30 mg by mouth daily. (Patient not taking: Reported on 04/07/2023), Disp: , Rfl:    DULoxetine (CYMBALTA) 60 MG capsule, Take 60 mg by mouth daily., Disp: , Rfl:    gabapentin (NEURONTIN) 300 MG capsule, Take 300 mg by mouth 2 (two) times daily., Disp: , Rfl:    hydrALAZINE (APRESOLINE) 50 MG tablet, Take 1 tablet (50 mg total) by mouth 3 (three) times daily. Skip the dose if SBP less than 130 mmHg, Disp: , Rfl:    loratadine (CLARITIN) 10 MG tablet, Take 10 mg by mouth daily as needed for allergies. AM, Disp: , Rfl:    metoprolol succinate (TOPROL-XL) 25 MG 24 hr tablet, Take 25 mg by mouth at bedtime., Disp: , Rfl:    simvastatin (ZOCOR) 20 MG tablet, Take 20 mg by mouth every evening. PM, Disp: , Rfl:     triamcinolone cream (KENALOG) 0.1 %, Apply 1 Application topically 2 (two) times daily., Disp: , Rfl:    valsartan (DIOVAN) 80 MG tablet, Take 80 mg by mouth daily., Disp: , Rfl:    Vitamin D, Ergocalciferol, (DRISDOL) 1.25 MG (50000 UNIT) CAPS capsule, Take 50,000 Units by mouth every 7 (seven) days., Disp: , Rfl:    Wheat Dextrin (BENEFIBER ON THE GO) PACK, Take 1 packet by mouth daily., Disp: , Rfl:    Zinc Oxide (TRIPLE PASTE EX), Apply 1 application  topically as directed. Every shift, Disp: , Rfl:    Social History: Social History   Tobacco Use   Smoking status: Never   Smokeless tobacco: Never  Vaping Use  Vaping status: Never Used  Substance Use Topics   Alcohol use: No   Drug use: Never    Family Medical History: Family History  Problem Relation Age of Onset   Hypertension Mother    Hypertension Father    Stroke Father    Breast cancer Daughter 107    Physical Examination: Vitals:   05/06/23 1600 05/06/23 1615  BP: (!) 172/77 (!) 178/89  Pulse: 76 77  Resp: 17 17  Temp:    SpO2: 97% 97%     General: Patient is well developed, well nourished, calm, collected, and in no apparent distress.  NEUROLOGICAL:  General: In no acute distress.   Awake, alert, oriented to person, place, and time.  Has good recall of events leading up to hospitalization.  Has good insight as to her situation.  Pupils equal round and reactive to light.  Possible slight left facial droop..  Tongue protrusion is midline.  Bilateral upper extremities are full strength proximally and distally.  There is no pronator drift.  Language is conversant.  Left lower extremity does show weakness proximally and has present foot drop  GCS:15   Bilateral upper and lower extremity sensation is intact to light touch.  Imaging: CT Head Wo Contrast Result Date: 05/06/2023 CLINICAL DATA:  Provided history: Head trauma, minor. Neck trauma. Additional history provided: Fall. EXAM: CT HEAD WITHOUT CONTRAST  CT CERVICAL SPINE WITHOUT CONTRAST TECHNIQUE: Multidetector CT imaging of the head and cervical spine was performed following the standard protocol without intravenous contrast. Multiplanar CT image reconstructions of the cervical spine were also generated. RADIATION DOSE REDUCTION: This exam was performed according to the departmental dose-optimization program which includes automated exposure control, adjustment of the mA and/or kV according to patient size and/or use of iterative reconstruction technique. COMPARISON:  Head CT 03/29/2023.  Cervical spine CT 03/29/2023. FINDINGS: CT HEAD FINDINGS Brain: Generalized cerebral atrophy. Mixed density subdural hematoma overlying the right cerebral hemisphere, measuring 2 cm in thickness. A significant portion of the hematoma is comprised of hyperdense acute components. There is mass effect upon the underlying right cerebral hemisphere with 5 mm leftward midline shift. 3 mm hyperdense focus along the septum pellucidum, new from the prior head CT of 03/29/2023. This could potentially reflect a small amount of hemorrhage within the septum pellucidum or within the left lateral ventricle (series 2, image 16) (series 4, image 33). Sequelae of mild chronic small vessel ischemic disease. Prominent perivascular space within the left basal ganglia inferiorly. No demarcated cortical infarct. No evidence of an intracranial mass. Vascular: No hyperdense vessel.  Atherosclerotic calcifications. Skull: No calvarial fracture or aggressive osseous lesion. Sinuses/Orbits: No mass or acute finding within the imaged orbits. No significant paranasal sinus disease at the imaged levels. CT CERVICAL SPINE FINDINGS Alignment: 2 mm grade 1 anterolisthesis at C7-T1, T1-T2 and T2-T3. Skull base and vertebrae: The basion-dental and atlanto-dental intervals are maintained.No evidence of acute fracture to the cervical spine. 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 Soft tissues and spinal canal: No  prevertebral fluid or swelling. No visible canal hematoma. Disc levels: Cervical spondylosis with multilevel disc space narrowing, disc bulges/central disc protrusions, posterior disc osteophyte complexes, uncovertebral hypertrophy and facet arthrosis. Disc space narrowing is greatest at C3-C4 (advanced), C5-C6 (moderate-to-advanced), C6-C7 (moderate-to-advanced) and T2-T3 (moderate-to-advanced). No appreciable high-grade spinal canal stenosis. Multilevel bony neural foraminal narrowing. Upper chest: No acute finding. CT head results were called by telephone at the time of interpretation on 05/06/2023 at 11:48 am to provider JENNA POGGI ,  who verbally acknowledged these results. IMPRESSION: CT head: 1. Mixed density subdural hematoma overlying the right cerebral hemisphere, measuring 2 cm in thickness. A significant portion of the hematoma is comprised of acute components. Mass effect upon the underlying right cerebral hemisphere with 5 mm leftward midline shift. 2. 3 mm hyperdense focus along the septum pellucidum, which could potentially reflect a small amount of hemorrhage within the septum pellucidum or within the left lateral ventricle. Attention recommended on imaging follow-up. CT cervical spine: 1. No evidence of an acute cervical spine fracture. 2. Mild grade 1 anterolisthesis at C7-T1, T1-T2 and T2-T3, unchanged from the prior cervical spine CT of 03/29/2023. 3. Cervical spondylosis as described. 4. 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 Electronically Signed   By: Jackey Loge D.O.   On: 05/06/2023 12:02   CT Cervical Spine Wo Contrast Result Date: 05/06/2023 CLINICAL DATA:  Provided history: Head trauma, minor. Neck trauma. Additional history provided: Fall. EXAM: CT HEAD WITHOUT CONTRAST CT CERVICAL SPINE WITHOUT CONTRAST TECHNIQUE: Multidetector CT imaging of the head and cervical spine was performed following the standard protocol without intravenous contrast. Multiplanar CT image reconstructions  of the cervical spine were also generated. RADIATION DOSE REDUCTION: This exam was performed according to the departmental dose-optimization program which includes automated exposure control, adjustment of the mA and/or kV according to patient size and/or use of iterative reconstruction technique. COMPARISON:  Head CT 03/29/2023.  Cervical spine CT 03/29/2023. FINDINGS: CT HEAD FINDINGS Brain: Generalized cerebral atrophy. Mixed density subdural hematoma overlying the right cerebral hemisphere, measuring 2 cm in thickness. A significant portion of the hematoma is comprised of hyperdense acute components. There is mass effect upon the underlying right cerebral hemisphere with 5 mm leftward midline shift. 3 mm hyperdense focus along the septum pellucidum, new from the prior head CT of 03/29/2023. This could potentially reflect a small amount of hemorrhage within the septum pellucidum or within the left lateral ventricle (series 2, image 16) (series 4, image 33). Sequelae of mild chronic small vessel ischemic disease. Prominent perivascular space within the left basal ganglia inferiorly. No demarcated cortical infarct. No evidence of an intracranial mass. Vascular: No hyperdense vessel.  Atherosclerotic calcifications. Skull: No calvarial fracture or aggressive osseous lesion. Sinuses/Orbits: No mass or acute finding within the imaged orbits. No significant paranasal sinus disease at the imaged levels. CT CERVICAL SPINE FINDINGS Alignment: 2 mm grade 1 anterolisthesis at C7-T1, T1-T2 and T2-T3. Skull base and vertebrae: The basion-dental and atlanto-dental intervals are maintained.No evidence of acute fracture to the cervical spine. 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 Soft tissues and spinal canal: No prevertebral fluid or swelling. No visible canal hematoma. Disc levels: Cervical spondylosis with multilevel disc space narrowing, disc bulges/central disc protrusions, posterior disc osteophyte complexes, uncovertebral  hypertrophy and facet arthrosis. Disc space narrowing is greatest at C3-C4 (advanced), C5-C6 (moderate-to-advanced), C6-C7 (moderate-to-advanced) and T2-T3 (moderate-to-advanced). No appreciable high-grade spinal canal stenosis. Multilevel bony neural foraminal narrowing. Upper chest: No acute finding. CT head results were called by telephone at the time of interpretation on 05/06/2023 at 11:48 am to provider [facility] , who verbally acknowledged these results. IMPRESSION: CT head: 1. Mixed density subdural hematoma overlying the right cerebral hemisphere, measuring 2 cm in thickness. A significant portion of the hematoma is comprised of acute components. Mass effect upon the underlying right cerebral hemisphere with 5 mm leftward midline shift. 2. 3 mm hyperdense focus along the septum pellucidum, which could potentially reflect a small amount of hemorrhage within  the septum pellucidum or within the left lateral ventricle. Attention recommended on imaging follow-up. CT cervical spine: 1. No evidence of an acute cervical spine fracture. 2. Mild grade 1 anterolisthesis at C7-T1, T1-T2 and T2-T3, unchanged from the prior cervical spine CT of 03/29/2023. 3. Cervical spondylosis as described. 4. 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 Electronically Signed   By: Jackey Loge D.O.   On: 05/06/2023 12:02   I have personally reviewed the images and agree with the above interpretation.  Labs:    Latest Ref Rng & Units 05/06/2023   10:51 AM 04/01/2023    5:19 AM 03/31/2023    8:54 AM  CBC  WBC 4.0 - 10.5 K/uL 7.7  8.9  7.2   Hemoglobin 12.0 - 15.0 g/dL 46.2  70.3  50.0   Hematocrit 36.0 - 46.0 % 33.2  36.0  36.5   Platelets 150 - 400 K/uL 218  200  191       Latest Ref Rng & Units 05/06/2023   10:51 AM 04/12/2023   12:00 AM 04/01/2023    5:19 AM  BMP  Glucose 70 - 99 mg/dL 938   182   BUN 8 - 23 mg/dL 34  27     39   Creatinine 0.44 - 1.00 mg/dL 9.93  1.4     7.16   Sodium 135 - 145 mmol/L 136  140      138   Potassium 3.5 - 5.1 mmol/L 4.3  4.9     4.4   Chloride 98 - 111 mmol/L 107  105     107   CO2 22 - 32 mmol/L 19  27     21    Calcium 8.9 - 10.3 mg/dL 9.5  96.7     8.5      This result is from an external source.    INR  1.0 (12/26 1051)   Assessment and Plan: Tina Curtis is a pleasant 89 y.o. female with a recent head trauma over a month ago.  She states that ever since that time her left lower extremity has been weak.  She is also had a mild headache.  She was in her usual state of health until yesterday when she was at home and cooking, she dropped an egg and went down to clean it up but unfortunately had a fall striking her head again.  She states that she continues to have a headache but has so ever since her prior admission for head trauma last month.  She is on antiplatelet agents, she is not on any anticoagulants.  She has not had any seizures.  She feels like her weakness has been stable in the left lower extremity for approximately the past month.  She also does have a headache and is not clear that this has worsened since her most recent fall and head trauma.  I spoke with both her and her daughter over the phone.  The patient stated that she would not like to have a large craniotomy for surgery as she does not think she would tolerate this well.  We are in agreement with that.  We did discuss bur hole drainage of that hematomas however we did counsel her on the complication profile given her age and medical comorbidities.  She is unsure whether or not she would want to go forward with that type of procedure, at this point given her lack of progressing deficit altered mental status, or debilitating headache she would like  to hold off on bur hole drainage at this point but may be open to it in the future.  Her daughter was in agreement with this as well at this point we will hold off on any intervention, we will plan to follow-up with a repeat head CT.  Hold her antiplatelet  medication in the setting of her bleed.  Given her age would not start on antiepileptics.  Should she have an acute decline please reach out and we will discuss bur hole drainage.  Will continue to round on her daily to evaluate for any acute changes.  Spent 45 minutes reviewing her notes, direct one-on-one patient evaluation with explanation of her images, discussion of treatment options, separate discussion with family members at her request.    Lovenia [name], MD/MSCR Dept. of Neurosurgery

## 2023-05-06 NOTE — ED Notes (Signed)
Patient returned from CT at this time.

## 2023-05-06 NOTE — H&P (Signed)
NAME:  Tina Curtis, MRN:  [id number], [name]:  [birth year], LOS: 0 ADMISSION DATE:  05/06/2023, CONSULTATION DATE: 05/06/2023 REFERRING MD: Willy Eddy, CHIEF COMPLAINT: Traumatic fall  HPI  89 y.o female with significant PMH of HTN, NSTEMI, chronic diastolic CHF, metastatic bone cancer, T2DM chronic headache, CVA on dual antiplatelet therapy, neuropathic pain, HLD, CKD, anxiety and depression, recent traumatic subarachnoid hemorrhage, recurrent falls who presented to the ED with chief complaints of traumatic fall.  Per ED reports patient report that she fell 2 days ago hitting her head without loss of consciousness.  Recent admission with similar presentation found to have traumatic subdural hematoma.  She was on dual antiplatelet therapy.  ED Course: Initial vital signs showed HR of 67 beats/minute, BP 135/24mm Hg, the RR 16 breaths/minute, and the oxygen saturation 96% on RA and a temperature of 97.39F (36.6C).   Pertinent Labs/Diagnostics Findings: Na+/ K+: 136/4.3 Glucose:153  BUN/Cr.:34/1.31 Imaging Obtained XR of left knee and pelvis> CTH> CT pelvis and CT cervical spine> see detailed report CTH findings discussed with on-call neurosurgeon who after evaluating patient at the bedside and discussing treatment option with the patient and family, opted for conservative management with observation. Disposition: ICU  Past Medical History   tSDH12/26/2024   Cancer, metastatic to bone (HCC) 04/05/2023  Temporal arteritis (HCC) 04/05/2023  Fall 03/29/2023  Bacteremia due to Streptococcus 08/31/2022  Sepsis due to Streptococcus species with acute hypoxic respiratory failure without septic shock (HCC) 08/27/2022  CHF NYHA class III, acute, diastolic (HCC) 08/27/2022  Hematemesis 08/26/2022  NSTEMI (non-ST elevated myocardial infarction) (HCC) 08/26/2022  Hypertensive urgency 08/26/2022  Hypokalemia 08/26/2022  Hypocalcemia 08/26/2022  Chronic diastolic CHF (congestive heart  failure) (HCC) 08/26/2022  Acute blood loss anemia 08/26/2022  Hypomagnesemia 08/26/2022  Hypophosphatemia 08/26/2022  Type II diabetes mellitus with renal manifestations (HCC) 08/26/2022  Rash 08/26/2022  Chest pain 08/26/2022  Acute on chronic respiratory failure with hypoxia (HCC) 06/10/2022  Emphysema lung (HCC) 06/10/2022  Pneumonia due to infectious organism 06/07/2022  History of breast cancer 06/06/2022  Depression 06/06/2022  Orthostatic hypotension 06/06/2022  Dehydration 06/06/2022  Dizziness and giddiness 06/02/2022  Osteoporosis of forearm 03/28/2020  Breast cancer (HCC) 10/13/2019  Carcinoma of upper-inner quadrant of right breast in female, estrogen receptor positive (HCC) 09/27/2019  Anemia in chronic kidney disease 02/01/2019  Atherosclerosis of renal artery (HCC) 02/01/2019  Benign hypertensive kidney disease with chronic kidney disease 02/01/2019  Hyposmolality and/or hyponatremia 02/01/2019  Proteinuria 02/01/2019  Secondary hyperparathyroidism of renal origin (HCC) 02/01/2019  Anemia of chronic renal failure, stage 3b (HCC) 04/01/2018  Renovascular hypertension 04/01/2018  Renal artery stenosis (HCC) 04/01/2018  Nausea & vomiting 03/05/2018  AKI (acute kidney injury) (HCC) 02/17/2018  Acquired hammer toe of right foot 08/14/2014  Bunion of right foot 08/14/2014  Gout 08/14/2014  History of Clostridium difficile colitis 08/14/2014  Pure hypercholesterolemia 08/14/2014  RLS (restless legs syndrome) 08/14/2014  Hallux rigidus of right foot 04/24/2014  Primary osteoarthritis of foot 09/01/2013  Fatigue 09/30/2012  Gluteus medius or minimus syndrome 08/23/2012  Trochanteric bursitis 08/12/2012  Benign essential hypertension 09/03/2011  Diverticulosis of colon 09/03/2011  Osteopenia 09/03/2011  Enthesopathy of ankle and tarsus 04/13/2011  Osteoarthritis of spine with radiculopathy, lumbosacral region 09/30/2010  Allergic rhinitis 10/31/1928   Significant  Hospital Events   12/26: Admitted with acute on chronic traumatic subdural hematoma with midline shift and mass effect  Consults:  Neurosurgery  Procedures:  None  Significant Diagnostic Tests:  12/27: Noncontrast CT head> IMPRESSION: CT head:  1. Mixed density subdural hematoma overlying the right cerebral hemisphere, measuring 2 cm in thickness. A significant portion of the hematoma is comprised of acute components. Mass effect upon the underlying right cerebral hemisphere with 5 mm leftward midline shift. 2. 3 mm hyperdense focus along the septum pellucidum, which could potentially reflect a small amount of hemorrhage within the septum pellucidum or within the left lateral ventricle. Attention recommended on imaging follow-up.   CT cervical spine:   1. No evidence of an acute cervical spine fracture. 2. Mild grade 1 anterolisthesis at C7-T1, T1-T2 and T2-T3, unchanged from the prior cervical spine CT of 03/29/2023. 3. Cervical spondylosis as described. 4. 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  Interim History / Subjective:    -Remain stable with no change in repeat CT head  Micro Data:  None  Antimicrobials:  None  OBJECTIVE  Blood pressure 133/75, pulse 84, temperature 98.5 F (36.9 C), temperature source Oral, resp. rate 14, height 5\' 2"  (1.575 m), weight 63 kg, SpO2 98%.        Intake/Output Summary (Last 24 hours) at 05/06/2023 1929 Last data filed at 05/06/2023 1237 Gross per 24 hour  Intake --  Output 150 ml  Net -150 ml   Filed Weights   05/06/23 0950  Weight: 63 kg   Physical Examination  GENERAL: 89 year-old critically ill patient lying in the bed in no acute distress EYES: PEERLA. No scleral icterus. Extraocular muscles intact.  HEENT: Head atraumatic, normocephalic. Oropharynx and nasopharynx clear.  NECK:  No JVD, supple  LUNGS: Normal breath sounds bilaterally.  No use of accessory muscles of respiration.  CARDIOVASCULAR: S1, S2  normal. No murmurs, rubs, or gallops.  ABDOMEN: Soft, NTND EXTREMITIES: Bruises.  Capillary refill < 3 seconds in all extremities. Pulses palpable distally. NEUROLOGIC: The patient is at and oriented x 2. No focal neurological deficit appreciated. Cranial nerves are intact.  SKIN: Multiple screen bruises as below         Labs/imaging that I havepersonally reviewed  (right click and "Reselect all SmartList Selections" daily)     Labs   CBC: Recent Labs  Lab 05/06/23 1051  WBC 7.7  NEUTROABS 4.8  HGB 11.1*  HCT 33.2*  MCV 94.1  PLT 218    Basic Metabolic Panel: Recent Labs  Lab 05/06/23 1051  NA 136  K 4.3  CL 107  CO2 19*  GLUCOSE 153*  BUN 34*  CREATININE 1.31*  CALCIUM 9.5   GFR: Estimated Creatinine Clearance: 22.9 mL/min (A) (by C-G formula based on SCr of 1.31 mg/dL (H)). Recent Labs  Lab 05/06/23 1051  WBC 7.7    Liver Function Tests: No results for input(s): "AST", "ALT", "ALKPHOS", "BILITOT", "PROT", "ALBUMIN" in the last 168 hours. No results for input(s): "LIPASE", "AMYLASE" in the last 168 hours. No results for input(s): "AMMONIA" in the last 168 hours.  ABG No results found for: "PHART", "PCO2ART", "PO2ART", "HCO3", "TCO2", "ACIDBASEDEF", "O2SAT"   Coagulation Profile: Recent Labs  Lab 05/06/23 1051  INR 1.0    Cardiac Enzymes: No results for input(s): "CKTOTAL", "CKMB", "CKMBINDEX", "TROPONINI" in the last 168 hours.  HbA1C: Hemoglobin A1C  Date/Time Value Ref Range Status  04/12/2023 12:00 AM 7.3  Final   Hgb A1c MFr Bld  Date/Time Value Ref Range Status  08/26/2022 07:54 AM 7.0 (H) 4.8 - 5.6 % Final    Comment:    (NOTE) Pre diabetes:          5.7%-6.4%  Diabetes:              >  6.4%  Glycemic control for   <7.0% adults with diabetes     CBG: No results for input(s): "GLUCAP" in the last 168 hours.  Review of Systems:   Unable to be obtained secondary to the patient being a poor historian  Past Medical History   She,  has a past medical history of Arthritis, Chronic kidney disease, Diarrhea, Hypercholesteremia, Hypertension, Neuropathy, Seasonal allergies, and Skin cancer.   Surgical History    Past Surgical History:  Procedure Laterality Date   ABDOMINAL HYSTERECTOMY  2000   APPENDECTOMY     BREAST BIOPSY Right 09/19/2019   affirm bx of calcs UOQ, x marker, path pending   BREAST BIOPSY Right 09/19/2019   [place] bx of mass,heart marker, path pending   BREAST BIOPSY Right 09/19/2019   [place] bx of LN, coil marker, path pending   CATARACT EXTRACTION Left    CATARACT EXTRACTION W/PHACO Right 10/24/2014   Procedure: CATARACT EXTRACTION PHACO AND INTRAOCULAR LENS PLACEMENT (IOC);  Surgeon: Lockie Mola, MD;  Location: [facility];  Service: Ophthalmology;  Laterality: Right;   ESOPHAGOGASTRODUODENOSCOPY N/A 03/07/2018   Procedure: ESOPHAGOGASTRODUODENOSCOPY (EGD);  Surgeon: Toney Reil, MD;  Location: [facility];  Service: Gastroenterology;  Laterality: N/A;   MASTECTOMY MODIFIED RADICAL Right 10/13/2019   Procedure: MASTECTOMY MODIFIED RADICAL;  Surgeon: Earline Mayotte, MD;  Location: [facility] ORS;  Service: General;  Laterality: Right;   RENAL ANGIOGRAPHY Right 04/11/2018   Procedure: RENAL ANGIOGRAPHY;  Surgeon: Annice Needy, MD;  Location: [facility] INVASIVE CV LAB;  Service: Cardiovascular;  Laterality: Right;   SIMPLE MASTECTOMY WITH AXILLARY SENTINEL NODE BIOPSY Left 10/13/2019   Procedure: SIMPLE MASTECTOMY TRUE CUT BIOPSY, SENTINEL NODE BIOPSY;  Surgeon: Earline Mayotte, MD;  Location: [facility] ORS;  Service: General;  Laterality: Left;   Social History   reports that she has never smoked. She has never used smokeless tobacco. She reports that she does not drink alcohol and does not use drugs.   Family History   Her family history includes Breast cancer (age of onset: 51) in her daughter; Hypertension in her father and mother; Stroke in her father.   Allergies Allergies   Allergen Reactions   Penicillins Anaphylaxis   Drug Ingredient [Zinc]    Empagliflozin Rash   Home Medications  Prior to Admission medications   Medication Sig Start Date End Date Taking? Authorizing Provider  acetaminophen (TYLENOL) 325 MG tablet Take 650 mg by mouth 3 (three) times daily.    [provider]  amLODipine (NORVASC) 5 MG tablet Take 1 tablet (5 mg total) by mouth every evening. Skip the dose if SBP less than 140 mmHg 04/02/23 04/01/24  Gillis Santa, MD  anastrozole (ARIMIDEX) 1 MG tablet Take 1 tablet (1 mg total) by mouth daily. 12/24/22   Earna Coder, MD  aspirin EC 81 MG tablet Take 1 tablet (81 mg total) by mouth daily. 04/05/23   Gillis Santa, MD  calcitRIOL (ROCALTROL) 0.25 MCG capsule Take 0.25 mcg by mouth as directed. Monday,Wednesday, Friday    [provider]  calcium carbonate (OS-CAL - DOSED IN MG OF ELEMENTAL CALCIUM) 1250 (500 Ca) MG tablet Take 1 tablet by mouth 2 (two) times daily with a meal.    [provider]  citalopram (CELEXA) 10 MG tablet Take 10 mg by mouth daily.  03/14/18   [provider]  clopidogrel (PLAVIX) 75 MG tablet Take 1 tablet (75 mg total) by mouth daily. 04/05/23   Gillis Santa, MD  cyanocobalamin (  VITAMIN B12) 1000 MCG tablet Take 1,000 mcg by mouth daily.    [provider]  diclofenac Sodium (VOLTAREN) 1 % GEL Apply 2 g topically every 6 (six) hours. To bilateral legs.    [provider]  docusate sodium (COLACE) 100 MG capsule Take 100 mg by mouth daily as needed for mild constipation.    [provider]  DULoxetine (CYMBALTA) 30 MG capsule Take 30 mg by mouth daily. Patient not taking: Reported on 04/07/2023 02/02/23   [provider]  DULoxetine (CYMBALTA) 60 MG capsule Take 60 mg by mouth daily. 11/11/18   [provider]  gabapentin (NEURONTIN) 300 MG capsule Take 300 mg by mouth 2 (two) times daily.    [provider]  hydrALAZINE  (APRESOLINE) 50 MG tablet Take 1 tablet (50 mg total) by mouth 3 (three) times daily. Skip the dose if SBP less than 130 mmHg 04/02/23 04/01/24  Gillis Santa, MD  loratadine (CLARITIN) 10 MG tablet Take 10 mg by mouth daily as needed for allergies. AM    [provider]  metoprolol succinate (TOPROL-XL) 25 MG 24 hr tablet Take 25 mg by mouth at bedtime.    [provider]  simvastatin (ZOCOR) 20 MG tablet Take 20 mg by mouth every evening. PM    [provider]  triamcinolone cream (KENALOG) 0.1 % Apply 1 Application topically 2 (two) times daily. 06/20/22 06/20/23  [provider]  valsartan (DIOVAN) 80 MG tablet Take 80 mg by mouth daily. 02/02/23   [provider]  Vitamin D, Ergocalciferol, (DRISDOL) 1.25 MG (50000 UNIT) CAPS capsule Take 50,000 Units by mouth every 7 (seven) days.    [provider]  Wheat Dextrin (BENEFIBER ON THE GO) PACK Take 1 packet by mouth daily.    [provider]  Zinc Oxide (TRIPLE PASTE EX) Apply 1 application  topically as directed. 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 -IV fluids gentle hydration. -Repeat CT head in 6 hours (or sooner if clinical worsening). -Keep platelets >100k, INR<1.4 -Replete electrolytes as needed. -Normothermia - For temperature >37.5C - acetaminophen 650mg  q4-6 hours PRN. -Relative euglycemia  (~ <180) and treat if hyperglycemia (>200 mg/dL)/hypoglycemia (< 60mg /dL).  -Precautions: Aspiration/seizure/fall. -Neurosurgery following see recommendation for conservative management  #Hypertension #Hypertensive Urgency Hx of Diastolic CHF, NSTEMI, CVA on DUAP, HLD Home meds: Amlodipine, hydralazine, metoprolol, valsartan -Continue Cleviprex for systolic goal -Hold antiplatelets -Continue simvastatin -Long-term BP goal normotensive  #CKD Stage III -Monitor I&O's / urinary output -Follow BMP -Ensure adequate renal perfusion -Avoid nephrotoxic agents as able -Replace electrolytes as indicated   #T2Diabetes mellitus -CBGs -Sliding scale insulin -Follow ICU hyper/hypoglycemia protocol  #Anxiety and Depression -Continue home citalopram and Cymbalta  Best practice:  Diet:  Oral Pain/Anxiety/Delirium protocol (if indicated): No VAP protocol (if indicated): Not indicated DVT prophylaxis: Contraindicated GI prophylaxis: N/A Glucose control:  SSI Yes Central venous access:  N/A Arterial line:  N/A Foley:  N/A Mobility:  bed rest  PT consulted: N/A Last date of multidisciplinary goals of care discussion [12/26] Code Status:  full code Disposition: FULL   = Goals of Care = Code Status Order: FULL  Primary Emergency Contact: Carby,[name] Wishes to pursue full aggressive treatment and intervention options, including CPR and intubation, but goals of care will be addressed on going with family if that should become necessary.   Critical care time: 45 minutes        Webb Silversmith DNP, CCRN, FNP-C, AGACNP-BC Acute Care & Family Nurse Practitioner [facility] P[facility]M on call pager [phone]

## 2023-05-07 ENCOUNTER — Inpatient Hospital Stay (HOSPITAL_COMMUNITY)
Admit: 2023-05-07 | Discharge: 2023-05-07 | Disposition: A | Discharge status: Home / Self Care | Payer: Medicare PPO | Attending: Critical Care Medicine | Admitting: Critical Care Medicine

## 2023-05-07 DIAGNOSIS — W19XXXA Unspecified fall, initial encounter: Secondary | ICD-10-CM | POA: Diagnosis not present

## 2023-05-07 DIAGNOSIS — N183 Chronic kidney disease, stage 3 unspecified: Secondary | ICD-10-CM

## 2023-05-07 DIAGNOSIS — S06A0XA Traumatic brain compression without herniation, initial encounter: Secondary | ICD-10-CM

## 2023-05-07 DIAGNOSIS — F419 Anxiety disorder, unspecified: Secondary | ICD-10-CM

## 2023-05-07 DIAGNOSIS — F32A Depression, unspecified: Secondary | ICD-10-CM

## 2023-05-07 DIAGNOSIS — S065XAA Traumatic subdural hemorrhage with loss of consciousness status unknown, initial encounter: Secondary | ICD-10-CM

## 2023-05-07 DIAGNOSIS — S062XAA Diffuse traumatic brain injury with loss of consciousness status unknown, initial encounter: Secondary | ICD-10-CM | POA: Diagnosis not present

## 2023-05-07 DIAGNOSIS — I161 Hypertensive emergency: Secondary | ICD-10-CM

## 2023-05-07 DIAGNOSIS — Z515 Encounter for palliative care: Secondary | ICD-10-CM

## 2023-05-07 DIAGNOSIS — I160 Hypertensive urgency: Secondary | ICD-10-CM | POA: Diagnosis not present

## 2023-05-07 LAB — BASIC METABOLIC PANEL
Anion gap: 9 (ref 5–15)
BUN: 30 mg/dL — ABNORMAL HIGH (ref 8–23)
CO2: 18 mmol/L — ABNORMAL LOW (ref 22–32)
Calcium: 9.2 mg/dL (ref 8.9–10.3)
Chloride: 113 mmol/L — ABNORMAL HIGH (ref 98–111)
Creatinine, Ser: 1.27 mg/dL — ABNORMAL HIGH (ref 0.44–1.00)
GFR, Estimated: 39 mL/min — ABNORMAL LOW (ref >60)
Glucose, Bld: 123 mg/dL — ABNORMAL HIGH (ref 70–99)
Potassium: 4.3 mmol/L (ref 3.5–5.1)
Sodium: 140 mmol/L (ref 135–145)

## 2023-05-07 LAB — CBC
HCT: 32.6 % — ABNORMAL LOW (ref 36.0–46.0)
Hemoglobin: 10.9 g/dL — ABNORMAL LOW (ref 12.0–15.0)
MCH: 31.9 pg (ref 26.0–34.0)
MCHC: 33.4 g/dL (ref 30.0–36.0)
MCV: 95.3 fL (ref 80.0–100.0)
Platelets: 207 10*3/uL (ref 150–400)
RBC: 3.42 MIL/uL — ABNORMAL LOW (ref 3.87–5.11)
RDW: 13.9 % (ref 11.5–15.5)
WBC: 7.1 10*3/uL (ref 4.0–10.5)
nRBC: 0 % (ref 0.0–0.2)

## 2023-05-07 LAB — MRSA NEXT GEN BY PCR, NASAL: MRSA by PCR Next Gen: NOT DETECTED

## 2023-05-07 LAB — ECHOCARDIOGRAM COMPLETE
AR max vel: 2.08 cm2
AV Area VTI: 2.43 cm2
AV Area mean vel: 2.03 cm2
AV Mean grad: 8 mm[Hg]
AV Peak grad: 17.1 mm[Hg]
Ao pk vel: 2.07 m/s
Area-P 1/2: 3.23 cm2
Height: 62 in
MV VTI: 3.34 cm2
S' Lateral: 1.9 cm
Weight: 2211.65 [oz_av]

## 2023-05-07 LAB — GLUCOSE, CAPILLARY: Glucose-Capillary: 102 mg/dL — ABNORMAL HIGH (ref 70–99)

## 2023-05-07 MED ORDER — HYDRALAZINE HCL 50 MG PO TABS
50.0000 mg | ORAL_TABLET | Freq: Three times a day (TID) | ORAL | Status: DC
  Administered 2023-05-08 – 2023-05-09 (×4): 50 mg via ORAL
  Filled 2023-05-07 (×4): qty 1

## 2023-05-07 MED ORDER — HYDRALAZINE HCL 50 MG PO TABS
25.0000 mg | ORAL_TABLET | Freq: Three times a day (TID) | ORAL | Status: DC
  Administered 2023-05-07 (×2): 25 mg via ORAL
  Filled 2023-05-07 (×2): qty 1

## 2023-05-07 MED ORDER — AMLODIPINE BESYLATE 5 MG PO TABS
5.0000 mg | ORAL_TABLET | Freq: Every day | ORAL | Status: DC
Start: 2023-05-07 — End: 2023-05-07
  Administered 2023-05-07: 5 mg via ORAL
  Filled 2023-05-07: qty 1

## 2023-05-07 MED ORDER — CHLORHEXIDINE GLUCONATE CLOTH 2 % EX PADS
6.0000 | MEDICATED_PAD | Freq: Every day | CUTANEOUS | Status: DC
  Administered 2023-05-07 – 2023-05-09 (×3): 6 via TOPICAL

## 2023-05-07 MED ORDER — GABAPENTIN 300 MG PO CAPS
300.0000 mg | ORAL_CAPSULE | Freq: Two times a day (BID) | ORAL | Status: DC
  Administered 2023-05-08 – 2023-05-11 (×7): 300 mg via ORAL
  Filled 2023-05-07 (×8): qty 1

## 2023-05-07 MED ORDER — DICLOFENAC SODIUM 1 % EX GEL
4.0000 g | Freq: Four times a day (QID) | CUTANEOUS | Status: DC
Start: 2023-05-08 — End: 2023-05-11
  Administered 2023-05-07 – 2023-05-11 (×11): 4 g via TOPICAL
  Filled 2023-05-07: qty 100

## 2023-05-07 MED ORDER — AMLODIPINE BESYLATE 10 MG PO TABS
10.0000 mg | ORAL_TABLET | Freq: Every day | ORAL | Status: DC
  Administered 2023-05-08 – 2023-05-11 (×4): 10 mg via ORAL
  Filled 2023-05-07 (×5): qty 1

## 2023-05-07 MED ORDER — MELATONIN 5 MG PO TABS
2.5000 mg | ORAL_TABLET | Freq: Every day | ORAL | Status: DC
Start: 2023-05-07 — End: 2023-05-07

## 2023-05-07 MED ORDER — GABAPENTIN 100 MG PO CAPS
200.0000 mg | ORAL_CAPSULE | Freq: Once | ORAL | Status: AC
  Administered 2023-05-07: 200 mg via ORAL
  Filled 2023-05-07: qty 2

## 2023-05-07 MED ORDER — METOPROLOL TARTRATE 25 MG PO TABS
12.5000 mg | ORAL_TABLET | Freq: Two times a day (BID) | ORAL | Status: DC
  Administered 2023-05-07 – 2023-05-11 (×9): 12.5 mg via ORAL
  Filled 2023-05-07 (×10): qty 1

## 2023-05-07 MED ORDER — MELATONIN 5 MG PO TABS
2.5000 mg | ORAL_TABLET | Freq: Every day | ORAL | Status: DC
  Administered 2023-05-07 – 2023-05-10 (×4): 2.5 mg via ORAL
  Filled 2023-05-07 (×4): qty 1

## 2023-05-07 NOTE — ED Notes (Signed)
Pt placed in hospital bed to increase comfort.

## 2023-05-07 NOTE — IPAL (Signed)
  Interdisciplinary Goals of Care Family Meeting   Date carried out: 05/07/2023  Location of the meeting: Phone conference  Member's involved: Physician and Family Member or next of kin  Durable Power of Attorney or acting medical decision maker: Daughter - [name]    Discussion: We discussed goals of care for Tina Curtis and [name] had that discussing before during hospitalization with her mother and clearly stated wishes for DNR/DNI given age, frailty and comorbidities which is reasonable. She also stated that they filled a form stating these wishes previously. Code status was changed to DNR/DNI in EMR.   Code status:   Code Status: Limited: Do not attempt resuscitation (DNR) -DNR-LIMITED -Do Not Intubate/DNI    Disposition: Continue current acute care  Time spent for the meeting: 20    Janann Colonel, MD  05/07/2023, 9:08 PM

## 2023-05-07 NOTE — Progress Notes (Signed)
*  PRELIMINARY RESULTS* Echocardiogram 2D Echocardiogram has been performed.  Tina Curtis 05/07/2023, 2:36 PM

## 2023-05-07 NOTE — Plan of Care (Signed)
  Problem: Education: Goal: Knowledge of disease or condition will improve Outcome: Progressing   Problem: Intracerebral Hemorrhage Tissue Perfusion: Goal: Complications of Intracerebral Hemorrhage will be minimized Outcome: Progressing   Problem: Health Behavior/Discharge Planning: Goal: Goals will be collaboratively established with patient/family Outcome: Progressing   Problem: Self-Care: Goal: Ability to participate in self-care as condition permits will improve Outcome: Progressing Goal: Ability to communicate needs accurately will improve Outcome: Progressing   Problem: Nutrition: Goal: Risk of aspiration will decrease Outcome: Progressing   Problem: Education: Goal: Knowledge of General Education information will improve Description: Including pain rating scale, medication(s)/side effects and non-pharmacologic comfort measures Outcome: Progressing   Problem: Clinical Measurements: Goal: Ability to maintain clinical measurements within normal limits will improve Outcome: Progressing Goal: Will remain free from infection Outcome: Progressing Goal: Respiratory complications will improve Outcome: Progressing Goal: Cardiovascular complication will be avoided Outcome: Progressing   Problem: Coping: Goal: Level of anxiety will decrease Outcome: Progressing   Problem: Pain Management: Goal: General experience of comfort will improve Outcome: Progressing   Problem: Safety: Goal: Ability to remain free from injury will improve Outcome: Progressing   Problem: Skin Integrity: Goal: Risk for impaired skin integrity will decrease Outcome: Progressing

## 2023-05-07 NOTE — NC FL2 (Cosign Needed)
[place] MEDICAID FL2 LEVEL OF CARE FORM     IDENTIFICATION  Patient Name: Tina Curtis Birthdate: 1927/03/06 Sex: female Admission Date (Current Location): 05/06/2023  [place] and [place] Number:  Chiropodist and Address:  [facility], [address] [place], [place] [address]      Provider Number: [id number]  Attending Physician Name and Address:  Janann Colonel, MD  Relative Name and Phone Number:  Dawne, Corea (Daughter)  [phone] (Mobile)    Current Level of Care: Hospital Recommended Level of Care: Skilled Nursing Facility Prior Approval Number:    Date Approved/Denied:   PASRR Number: [id number]  Discharge Plan:      Current Diagnoses: Patient Active Problem List   Diagnosis Date Noted   Subdural hematoma (HCC) 05/06/2023   Cancer, metastatic to bone (HCC) 04/05/2023   Temporal arteritis (HCC) 04/05/2023   Fall 03/29/2023   Bacteremia due to Streptococcus 08/31/2022   Sepsis due to Streptococcus species with acute hypoxic respiratory failure without septic shock (HCC) 08/27/2022   CHF NYHA class III, acute, diastolic (HCC) 08/27/2022   Hematemesis 08/26/2022   NSTEMI (non-ST elevated myocardial infarction) (HCC) 08/26/2022   Hypertensive urgency 08/26/2022   Hypokalemia 08/26/2022   Hypocalcemia 08/26/2022   Chronic diastolic CHF (congestive heart failure) (HCC) 08/26/2022   Acute blood loss anemia 08/26/2022   Hypomagnesemia 08/26/2022   Hypophosphatemia 08/26/2022   Type II diabetes mellitus with renal manifestations (HCC) 08/26/2022   Rash 08/26/2022   Chest pain 08/26/2022   Acute on chronic respiratory failure with hypoxia (HCC) 06/10/2022   Emphysema lung (HCC) 06/10/2022   Pneumonia due to infectious organism 06/07/2022   History of breast cancer 06/06/2022   Depression 06/06/2022   Orthostatic hypotension 06/06/2022   Dehydration 06/06/2022   Dizziness and giddiness 06/02/2022   Osteoporosis  of forearm 03/28/2020   Breast cancer (HCC) 10/13/2019   Carcinoma of upper-inner quadrant of right breast in female, estrogen receptor positive (HCC) 09/27/2019   Anemia in chronic kidney disease 02/01/2019   Atherosclerosis of renal artery (HCC) 02/01/2019   Benign hypertensive kidney disease with chronic kidney disease 02/01/2019   Hyposmolality and/or hyponatremia 02/01/2019   Proteinuria 02/01/2019   Secondary hyperparathyroidism of renal origin (HCC) 02/01/2019   Anemia of chronic renal failure, stage 3b (HCC) 04/01/2018   Renovascular hypertension 04/01/2018   Renal artery stenosis (HCC) 04/01/2018   Nausea & vomiting 03/05/2018   AKI (acute kidney injury) (HCC) 02/17/2018   Acquired hammer toe of right foot 08/14/2014   Bunion of right foot 08/14/2014   Gout 08/14/2014   History of Clostridium difficile colitis 08/14/2014   Pure hypercholesterolemia 08/14/2014   RLS (restless legs syndrome) 08/14/2014   Hallux rigidus of right foot 04/24/2014   Primary osteoarthritis of foot 09/01/2013   Fatigue 09/30/2012   Gluteus medius or minimus syndrome 08/23/2012   Trochanteric bursitis 08/12/2012   Benign essential hypertension 09/03/2011   Diverticulosis of colon 09/03/2011   Osteopenia 09/03/2011   Enthesopathy of ankle and tarsus 04/13/2011   Osteoarthritis of spine with radiculopathy, lumbosacral region 09/30/2010   Allergic rhinitis 10/31/1928    Orientation RESPIRATION BLADDER Height & Weight     Self, Time, Situation, Place  Normal   Weight: 138 lb 3.7 oz (62.7 kg) Height:  5\' 2"  (157.5 cm)  BEHAVIORAL SYMPTOMS/MOOD NEUROLOGICAL BOWEL NUTRITION STATUS        Diet (regular)  AMBULATORY STATUS COMMUNICATION OF NEEDS Skin   Extensive Assist Verbally Bruising, Skin abrasions  Personal Care Assistance Level of Assistance  Bathing, Feeding, Dressing Bathing Assistance: Maximum assistance Feeding assistance: Limited assistance Dressing  Assistance: Maximum assistance     Functional Limitations Info  Sight, Hearing, Speech Sight Info: Adequate Hearing Info: Adequate Speech Info: Adequate    SPECIAL CARE FACTORS FREQUENCY  PT (By licensed PT), OT (By licensed OT)     PT Frequency: 5 times per week OT Frequency: 5 times per week            Contractures      Additional Factors Info  Code Status, Allergies Code Status Info: Do not attempt resuscitation (DNR) -DNR-LIMITED -Do Not Intubate/DNI Allergies Info: Penicillins, Drug Ingredient (Zinc), Empagliflozin           Current Medications (05/07/2023):  This is the current hospital active medication list Current Facility-Administered Medications  Medication Dose Route Frequency Provider Last Rate Last Admin    stroke: early stages of recovery book   Does not apply Once Ezequiel Essex, NP       acetaminophen (TYLENOL) tablet 650 mg  650 mg Oral Q4H PRN Ezequiel Essex, NP   650 mg at 05/07/23 1009   Or   acetaminophen (TYLENOL) 160 MG/5ML solution 650 mg  650 mg Per Tube Q4H PRN Ezequiel Essex, NP       Or   acetaminophen (TYLENOL) suppository 650 mg  650 mg Rectal Q4H PRN Ezequiel Essex, NP       amLODipine (NORVASC) tablet 5 mg  5 mg Oral Daily Harlon Ditty D, NP   5 mg at 05/07/23 1141   Chlorhexidine Gluconate Cloth 2 % PADS 6 each  6 each Topical Daily Assaker, [place], MD   6 each at 05/07/23 1213   clevidipine (CLEVIPREX) infusion 0.5 mg/mL  0-21 mg/hr Intravenous Continuous Ezequiel Essex, NP 8 mL/hr at 05/07/23 1704 4 mg/hr at 05/07/23 1704   hydrALAZINE (APRESOLINE) tablet 25 mg  25 mg Oral Q8H Harlon Ditty D, NP   25 mg at 05/07/23 1428   metoprolol tartrate (LOPRESSOR) tablet 12.5 mg  12.5 mg Oral BID Harlon Ditty D, NP   12.5 mg at 05/07/23 1513   pantoprazole (PROTONIX) injection 40 mg  40 mg Intravenous QHS Ezequiel Essex, NP   40 mg at 05/07/23 2725   senna-docusate (Senokot-S) tablet 1 tablet  1 tablet Oral BID Ezequiel Essex, NP    1 tablet at 05/07/23 1008     Discharge Medications: Please see discharge summary for a list of discharge medications.  Relevant Imaging Results:  Relevant Lab Results:   Additional Information DG#[id number]  Rosell Khouri E Davionna Blacksher, LCSW

## 2023-05-07 NOTE — ED Notes (Signed)
Lab called and stated that they had an extra purple tube that was collected this am and asked if the CBC could be run from that--this RN just coming on at 1100, noted that the CBC hadn't been done and was about to collect her lab and told the lab to go ahead and run it.

## 2023-05-07 NOTE — Consult Note (Signed)
Consultation Note Date: 05/07/2023   Patient Name: Tina Curtis  [name]: [birth year]  MRN: [id number]  Age / Sex: 89 y.o., female  PCP: Tina Rud, MD Referring Physician: Janann Colonel, MD  Reason for Consultation: Establishing goals of care   HPI/Brief Hospital Course: 89 y.o. female  with past medical history of HTN, NSTEMI, diastolic CHF, breast cancer followed by oncology remains on Anastrazole, T2DM, chronic headache, CVA on dual therapy, neuropathic pain, HLD, CKD, anxiety and depression, recent traumatic subarachnoid hemorrhage and recurrent falls   admitted from [facility] on 05/06/2023 with traumatic fall.  Initial CT head revealed IMPRESSION: CT head: 1. Mixed density subdural hematoma overlying the right cerebral hemisphere, measuring 2 cm in thickness. A significant portion of the hematoma is comprised of acute components. Mass effect upon the underlying right cerebral hemisphere with 5 mm leftward midline shift. 2. 3 mm hyperdense focus along the septum pellucidum, which could potentially reflect a small amount of hemorrhage within the septum pellucidum or within the left lateral ventricle. Attention recommended on imaging follow-up.  Neurosurgery consulted-repeat scan stable, patient and family have opted out of pursuing full craniotomy surgery, discussion still had around pursuing burr holes if Tina Curtis declines, recommends to monitor closely, safe to work with PT/OT  Noted recent admit 11/18-11/22 for fall and found to have trace subarachnoid hemorrhage-neurosurgery recommended conservative management at that time and holding dual antiplatelet therapy x 1 week  Palliative medicine was consulted for assisting with goals of care conversations.  Subjective:  Extensive chart review has been completed prior to meeting patient including labs, vital signs, imaging, progress notes, orders, and available  advanced directive documents from current and previous encounters.  Visited with Tina Curtis at her bedside. She is awake, alert, oriented and able to engage in conversations. Nieces present at bedside. Niece attempting to assist Tina Curtis with eating. Plan set to return at a later time to revisit after her meal.  Called and spoke with Tina Curtis-daughter.  Introduced myself as a Publishing rights manager as a member of the palliative care team. Explained palliative medicine is specialized medical care for people living with serious illness. It focuses on providing relief from the symptoms and stress of a serious illness. The goal is to improve quality of life for both the patient and the family.   Tina Curtis lives in [place] and her sister-Tina Curtis lives in [place]. Tina Curtis has plans to visit with her mother on Monday. Tina Curtis shares what is most important to Tina Curtis is living independently. Tina Curtis shares she has tried in the past to get her mother assistance in her home but she has not been accepting. We discussed code status as there is conflicting information in EMR. Currently listed as Full Code but DNR form available in [employer] completed in 08/2022. Tina Curtis shares she is aware of DNR status, shared with Tina Curtis I would confirm current wishes with Tina Curtis.  Tina Curtis shares she lives at [facility] independent [facility]. She enjoys spending time with her friends and family and is active in her community. She is hard of hearing but does not like to wear her hearing aids. Prior to admission, Tina Curtis able to care for herself independently.  Tina Curtis able to share her understanding of current situation. Aware that she fell and has a brain bleed. She is able to recall conversations had with neurosurgery regarding interventions. She remains unclear on how she would like to proceed in the event of a decline.  We discussed patient's  current illness and what it means in the larger context of patient's on-going co-morbidities.    Attempted to elicit goals of care conversations. We discussed code status and the difference between Full Code and Do Not Resuscitate. Encouraged patient/family to consider DNR/DNI status understanding evidenced based poor outcomes in similar hospitalized patients, as the cause of the arrest is likely associated with chronic/terminal disease rather than a reversible acute cardio-pulmonary event.  Reviewed DNR form uploaded into EMR. During our conversations it seems, Tina Curtis is having difficulty distinguishing the difference between code status and pursuing neurosurgery interventions. Nieces present also attempted to clarify. Ms Curtis unable to clearly state her wishes regarding code status at this time.  Conversations shared with ICU attending and nursing staff.  I discussed importance of continued conversations with family/support persons and all members of their medical team regarding overall plan of care and treatment options ensuring decisions are in alignment with patients goals of care.  All questions/concerns addressed. PMT will continue to follow and support patient as needed.  Objective: Primary Diagnoses: Present on Admission:  Subdural hematoma (HCC)   Physical Exam Constitutional:      General: She is not in acute distress.    Appearance: She is not ill-appearing.  Pulmonary:     Effort: Pulmonary effort is normal. No respiratory distress.  Skin:    General: Skin is warm and dry.     Findings: Bruising present.  Neurological:     Mental Status: She is alert.     Vital Signs: BP 129/61   Pulse 97   Temp 98.3 F (36.8 C) (Oral)   Resp 13   Ht 5\' 2"  (1.575 m)   Wt 62.7 kg   SpO2 97%   BMI 25.28 kg/m  Pain Scale: 0-10   Pain Score: 0-No pain  IO: Intake/output summary:  Intake/Output Summary (Last 24 hours) at 05/07/2023 1542 Last data filed at 05/07/2023 1517 Gross per 24 hour  Intake 101.12 ml  Output --  Net 101.12 ml    LBM: Last BM Date :   (PTA) Baseline Weight: Weight: 63 kg Most recent weight: Weight: 62.7 kg      Assessment and Plan  SUMMARY OF RECOMMENDATIONS   Time for outcomes PMT to continue to follow for ongoing needs and support  Discussed With: Nursing staff and ICU attending  Thank you for this consult and allowing Palliative Medicine to participate in the care of Tina Curtis. Palliative medicine will continue to follow and assist as needed.   Time Total: 75 minutes  Time spent includes: Detailed review of medical records (labs, imaging, vital signs), medically appropriate exam (mental status, respiratory, cardiac, skin), discussed with treatment team, counseling and educating patient, family and staff, documenting clinical information, medication management and coordination of care.   Signed by: Leeanne Deed, DNP, AGNP-C Palliative Medicine    Please contact Palliative Medicine Team phone at [phone] for questions and concerns.  For individual provider: See Loretha Stapler

## 2023-05-07 NOTE — Progress Notes (Signed)
Attending Progress Note  History: Tina Curtis is here for large subacute on chronic SDH.   HD2: Headache Resolved, weakness improving, patient complains mostly of left ankle pain at this moment.  HD1: Mild headache, generalized weakness.   Physical Exam: Vitals:   05/07/23 1030 05/07/23 1100  BP: (!) 152/72 (!) 149/80  Pulse: 86 92  Resp: 18 17  Temp:    SpO2: 99% 100%    AA Ox3 CNI, possible slight left facial droop No pronator drift Left lower extremity seems slightly sluggish compared to the right, has a foot drop of unknown etiology/timeline.  Says that she has been given an AFO previously.  Data:  Recent Labs  Lab 05/06/23 1051 05/07/23 0614  NA 136 140  K 4.3 4.3  CL 107 113*  CO2 19* 18*  BUN 34* 30*  CREATININE 1.31* 1.27*  GLUCOSE 153* 123*  CALCIUM 9.5 9.2   No results for input(s): "AST", "ALT", "ALKPHOS" in the last 168 hours.  Invalid input(s): "TBILI"   Recent Labs  Lab 05/06/23 1051  WBC 7.7  HGB 11.1*  HCT 33.2*  PLT 218   Recent Labs  Lab 05/06/23 1051  INR 1.0         Other tests/results:  CT HEAD WO CONTRAST ( ) Result Date: 05/06/2023 CLINICAL DATA:  Subdural hematoma SDH expansion evaluation EXAM: CT HEAD WITHOUT CONTRAST TECHNIQUE: Contiguous axial images were obtained from the base of the skull through the vertex without intravenous contrast. RADIATION DOSE REDUCTION: This exam was performed according to the departmental dose-optimization program which includes automated exposure control, adjustment of the mA and/or kV according to patient size and/or use of iterative reconstruction technique. COMPARISON:  Same-day head CT FINDINGS: Brain: No significant change in size of the mixed density right cerebral convexity subdural hematoma measuring up to 2.9 cm in thickness. Unchanged 6 mm leftward midline shift. No new sites of hemorrhage. No CT evidence of an acute cortical infarct. No hydrocephalus. Unchanged 2 mm hyperdense focus  along the septum pellucidum Vascular: No hyperdense vessel or unexpected calcification. Skull: Normal. Negative for fracture or focal lesion. Sinuses/Orbits: Small right mastoid effusion. No middle ear effusion. Paranasal sinuses are clear. Bilateral lens replacement. Orbits are otherwise unremarkable. Other: None. IMPRESSION: 1. No significant change in size of the mixed density right cerebral convexity subdural hematoma measuring up to 2.9 cm in thickness. Unchanged 6 mm leftward midline shift. 2. Unchanged 2 mm hyperdense focus along the septum pellucidum. Electronically Signed   By: Lorenza Cambridge M.D.   On: 05/06/2023 17:08   CT Head Wo Contrast Result Date: 05/06/2023 CLINICAL DATA:  Provided history: Head trauma, minor. Neck trauma. Additional history provided: Fall. EXAM: CT HEAD WITHOUT CONTRAST CT CERVICAL SPINE WITHOUT CONTRAST TECHNIQUE: Multidetector CT imaging of the head and cervical spine was performed following the standard protocol without intravenous contrast. Multiplanar CT image reconstructions of the cervical spine were also generated. RADIATION DOSE REDUCTION: This exam was performed according to the departmental dose-optimization program which includes automated exposure control, adjustment of the mA and/or kV according to patient size and/or use of iterative reconstruction technique. COMPARISON:  Head CT 03/29/2023.  Cervical spine CT 03/29/2023. FINDINGS: CT HEAD FINDINGS Brain: Generalized cerebral atrophy. Mixed density subdural hematoma overlying the right cerebral hemisphere, measuring 2 cm in thickness. A significant portion of the hematoma is comprised of hyperdense acute components. There is mass effect upon the underlying right cerebral hemisphere with 5 mm leftward midline shift. 3 mm hyperdense focus along  the septum pellucidum, new from the prior head CT of 03/29/2023. This could potentially reflect a small amount of hemorrhage within the septum pellucidum or within the left  lateral ventricle (series 2, image 16) (series 4, image 33). Sequelae of mild chronic small vessel ischemic disease. Prominent perivascular space within the left basal ganglia inferiorly. No demarcated cortical infarct. No evidence of an intracranial mass. Vascular: No hyperdense vessel.  Atherosclerotic calcifications. Skull: No calvarial fracture or aggressive osseous lesion. Sinuses/Orbits: No mass or acute finding within the imaged orbits. No significant paranasal sinus disease at the imaged levels. CT CERVICAL SPINE FINDINGS Alignment: 2 mm grade 1 anterolisthesis at C7-T1, T1-T2 and T2-T3. Skull base and vertebrae: The basion-dental and atlanto-dental intervals are maintained.No evidence of acute fracture to the cervical spine. 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 Soft tissues and spinal canal: No prevertebral fluid or swelling. No visible canal hematoma. Disc levels: Cervical spondylosis with multilevel disc space narrowing, disc bulges/central disc protrusions, posterior disc osteophyte complexes, uncovertebral hypertrophy and facet arthrosis. Disc space narrowing is greatest at C3-C4 (advanced), C5-C6 (moderate-to-advanced), C6-C7 (moderate-to-advanced) and T2-T3 (moderate-to-advanced). No appreciable high-grade spinal canal stenosis. Multilevel bony neural foraminal narrowing. Upper chest: No acute finding. CT head results were called by telephone at the time of interpretation on 05/06/2023 at 11:48 am to provider [facility] , who verbally acknowledged these results. IMPRESSION: CT head: 1. Mixed density subdural hematoma overlying the right cerebral hemisphere, measuring 2 cm in thickness. A significant portion of the hematoma is comprised of acute components. Mass effect upon the underlying right cerebral hemisphere with 5 mm leftward midline shift. 2. 3 mm hyperdense focus along the septum pellucidum, which could potentially reflect a small amount of hemorrhage within the septum pellucidum or within  the left lateral ventricle. Attention recommended on imaging follow-up. CT cervical spine: 1. No evidence of an acute cervical spine fracture. 2. Mild grade 1 anterolisthesis at C7-T1, T1-T2 and T2-T3, unchanged from the prior cervical spine CT of 03/29/2023. 3. Cervical spondylosis as described. 4. 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 Electronically Signed   By: Jackey Loge D.O.   On: 05/06/2023 12:02   CT Cervical Spine Wo Contrast Result Date: 05/06/2023 CLINICAL DATA:  Provided history: Head trauma, minor. Neck trauma. Additional history provided: Fall. EXAM: CT HEAD WITHOUT CONTRAST CT CERVICAL SPINE WITHOUT CONTRAST TECHNIQUE: Multidetector CT imaging of the head and cervical spine was performed following the standard protocol without intravenous contrast. Multiplanar CT image reconstructions of the cervical spine were also generated. RADIATION DOSE REDUCTION: This exam was performed according to the departmental dose-optimization program which includes automated exposure control, adjustment of the mA and/or kV according to patient size and/or use of iterative reconstruction technique. COMPARISON:  Head CT 03/29/2023.  Cervical spine CT 03/29/2023. FINDINGS: CT HEAD FINDINGS Brain: Generalized cerebral atrophy. Mixed density subdural hematoma overlying the right cerebral hemisphere, measuring 2 cm in thickness. A significant portion of the hematoma is comprised of hyperdense acute components. There is mass effect upon the underlying right cerebral hemisphere with 5 mm leftward midline shift. 3 mm hyperdense focus along the septum pellucidum, new from the prior head CT of 03/29/2023. This could potentially reflect a small amount of hemorrhage within the septum pellucidum or within the left lateral ventricle (series 2, image 16) (series 4, image 33). Sequelae of mild chronic small vessel ischemic disease. Prominent perivascular space within the left basal ganglia inferiorly. No demarcated cortical infarct.  No evidence of an intracranial mass. Vascular:  No hyperdense vessel.  Atherosclerotic calcifications. Skull: No calvarial fracture or aggressive osseous lesion. Sinuses/Orbits: No mass or acute finding within the imaged orbits. No significant paranasal sinus disease at the imaged levels. CT CERVICAL SPINE FINDINGS Alignment: 2 mm grade 1 anterolisthesis at C7-T1, T1-T2 and T2-T3. Skull base and vertebrae: The basion-dental and atlanto-dental intervals are maintained.No evidence of acute fracture to the cervical spine. 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 Soft tissues and spinal canal: No prevertebral fluid or swelling. No visible canal hematoma. Disc levels: Cervical spondylosis with multilevel disc space narrowing, disc bulges/central disc protrusions, posterior disc osteophyte complexes, uncovertebral hypertrophy and facet arthrosis. Disc space narrowing is greatest at C3-C4 (advanced), C5-C6 (moderate-to-advanced), C6-C7 (moderate-to-advanced) and T2-T3 (moderate-to-advanced). No appreciable high-grade spinal canal stenosis. Multilevel bony neural foraminal narrowing. Upper chest: No acute finding. CT head results were called by telephone at the time of interpretation on 05/06/2023 at 11:48 am to provider [facility] , who verbally acknowledged these results. IMPRESSION: CT head: 1. Mixed density subdural hematoma overlying the right cerebral hemisphere, measuring 2 cm in thickness. A significant portion of the hematoma is comprised of acute components. Mass effect upon the underlying right cerebral hemisphere with 5 mm leftward midline shift. 2. 3 mm hyperdense focus along the septum pellucidum, which could potentially reflect a small amount of hemorrhage within the septum pellucidum or within the left lateral ventricle. Attention recommended on imaging follow-up. CT cervical spine: 1. No evidence of an acute cervical spine fracture. 2. Mild grade 1 anterolisthesis at C7-T1, T1-T2 and T2-T3, unchanged from the  prior cervical spine CT of 03/29/2023. 3. Cervical spondylosis as described. 4. 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 Electronically Signed   By: Jackey Loge D.O.   On: 05/06/2023 12:02    Assessment/Plan:  SOLANGE EWER is a very pleasant 89 year old woman with cancer, hypertension, A-fib on antiplatelets who presented to the emergency department after a fall.  Notably she was admitted for a fall approximately 1-1/2 months ago.  On her screening she was found to have a large right sided mixed density subdural hematoma with approximately 5 mm of midline shift right to left.  She had minimal headache on presentation and very slight left lower extremity weakness which she states had been present for well over a month.  Follow-up this morning, repeat head CT is stable.  Spoke with the patient and her daughter last night who again reiterated that they would not want the full craniotomy surgery but they would not rule out the bur holes if she were to decline.  At this moment neither are comfortable making a decision about bur hole treatment should she have an acute decline, I let them know that in this case we would have to make a decision not in an emergency situation and they both acknowledged this.  At this point we will not plan on making any intervention today.  Her CT scan appeared stable.  Would again continue to avoid antiepileptics given her age her last CT scan was stable so we do not need any scheduled intracranial imaging.  She can work with physical therapy occupational therapy.  From a neurosurgery standpoint could have a diet.  Would again avoid restarting her antiplatelet medication given the bleed.  Lovenia [name], MD/MSCR Department of Neurosurgery

## 2023-05-07 NOTE — Evaluation (Signed)
Physical Therapy Evaluation Patient Details Name: Tina Curtis MRN: [id number] [name]: 1927-02-05 Today's Date: 05/07/2023  History of Present Illness  Pt is a 89 y.o female with significant PMH of HTN, NSTEMI, chronic diastolic CHF, metastatic bone cancer, T2DM chronic headache, CVA on dual antiplatelet therapy, neuropathic pain, HLD, CKD, anxiety and depression, recent traumatic subarachnoid hemorrhage, recurrent falls who presented to the ED with chief complaints of traumatic fall. 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  Clinical Impression  Pt alert, oriented to self, family, situation. HOH, able to follow one step commands well. Pt reported at baseline she has had a few falls at home now [facility]) has a rollator, normally able to care for herself. The pt required modAx1-2 for bed mobility, sit <> Stand twice with RW and minA-modAx2. Noted for posterior lean and needed sequencing cues to increase safety. Did complain of dizziness throughout standing attempted to check BP but should be re-visited with future mobility (history of orthostatic hypotension). She was able to take 3-4 side steps at EOB with minAx2 including weight shifting and RW assist. Returned to bed for echo.  Overall the patient demonstrated deficits (see "PT Problem List") that impede the patient's functional abilities, safety, and mobility and would benefit from skilled PT intervention.          If plan is discharge home, recommend the following: A lot of help with walking and/or transfers;A lot of help with bathing/dressing/bathroom;Assist for transportation;Assistance with cooking/housework;Help with stairs or ramp for entrance   Can travel by private vehicle   No    Equipment Recommendations Other (comment) (TBD)  Recommendations for Other Services       Functional Status Assessment Patient has had a recent decline in their functional status and demonstrates the  ability to make significant improvements in function in a reasonable and predictable amount of time.     Precautions / Restrictions Precautions Precautions: Fall Restrictions Weight Bearing Restrictions Per Provider Order: No      Mobility  Bed Mobility Overal bed mobility: Needs Assistance Bed Mobility: Supine to Sit, Sit to Supine     Supine to sit: Mod assist, +2 for safety/equipment, HOB elevated, Used rails Sit to supine: Mod assist, +2 for safety/equipment, Used rails        Transfers Overall transfer level: Needs assistance Equipment used: Rolling walker (2 wheels) Transfers: Sit to/from Stand Sit to Stand: Min assist, Mod assist, +2 physical assistance           General transfer comment: performed twice, challenged with lift off as well as posterior lean.    Ambulation/Gait Ambulation/Gait assistance: Min assist, +2 physical assistance Gait Distance (Feet): 2 Feet Assistive device: Rolling walker (2 wheels)         General Gait Details: posterior lean, assist for RW needed  Stairs            Wheelchair Mobility     Tilt Bed    Modified Rankin (Stroke Patients Only)       Balance Overall balance assessment: Needs assistance Sitting-balance support: Feet supported Sitting balance-Leahy Scale: Fair Sitting balance - Comments: intermittent posterior lean Postural control: Posterior lean Standing balance support: Bilateral upper extremity supported Standing balance-Leahy Scale: Poor Standing balance comment: posterior lean except once or twice in standing                             Pertinent  Vitals/Pain Pain Assessment Pain Assessment: Faces Faces Pain Scale: Hurts little more Pain Location: left leg Pain Descriptors / Indicators: Grimacing Pain Intervention(s): Limited activity within patient's tolerance, Monitored during session, Repositioned    Home Living Family/patient expects to be discharged to:: Private  residence Living Arrangements: Alone Available Help at Discharge: Family;Available PRN/intermittently Type of Home: Independent living facility Home Access: Level entry       Home Layout: One level Home Equipment: Rollator (4 wheels);Rolling Walker (2 wheels)      Prior Function               Mobility Comments: Mod indep with 3WRW in home, 4WRW in community; not driving anymore; reports bilat neuropathy, reports chronic orthostatic hypotension ADLs Comments: independent. Pt states she used to enjoy quilting (hand quilting), but due to macular degeneration doesn't do that anymore. 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     Extremity/Trunk Assessment   Upper Extremity Assessment Upper Extremity Assessment: Defer to OT evaluation    Lower Extremity Assessment Lower Extremity Assessment: Generalized weakness (pain in R hip and L knee, but able to move BLE against gravity on bed)       Communication   Communication Communication: Hearing impairment  Cognition Arousal: Alert Behavior During Therapy: WFL for tasks assessed/performed Overall Cognitive Status: Within Functional Limits for tasks assessed                                          General Comments      Exercises     Assessment/Plan    PT Assessment Patient needs continued PT services  PT Problem List Decreased strength;Pain;Decreased range of motion;Decreased activity tolerance;Decreased balance;Decreased mobility;Decreased knowledge of precautions       PT Treatment Interventions DME instruction;Balance training;Gait training;Neuromuscular re-education;Stair training;Functional mobility training;Patient/family education;Therapeutic activities;Therapeutic exercise    PT Goals (Current goals can be found in the Care Plan section)  Acute Rehab PT Goals Patient Stated Goal: to go home PT Goal Formulation: With patient Time For Goal Achievement: 05/21/23 Potential to Achieve  Goals: Fair    Frequency Min 1X/week     Co-evaluation               AM-PAC PT "6 Clicks" Mobility  Outcome Measure Help needed turning from your back to your side while in a flat bed without using bedrails?: A Lot Help needed moving from lying on your back to sitting on the side of a flat bed without using bedrails?: A Lot Help needed moving to and from a bed to a chair (including a wheelchair)?: A Lot Help needed standing up from a chair using your arms (e.g., wheelchair or bedside chair)?: A Lot Help needed to walk in hospital room?: A Lot Help needed climbing 3-5 steps with a railing? : A Lot 6 Click Score: 12    End of Session   Activity Tolerance: Patient tolerated treatment well;Patient limited by fatigue Patient left: in bed;with call bell/phone within reach;with bed alarm set Nurse Communication: Mobility status PT Visit Diagnosis: Other abnormalities of gait and mobility (R26.89);Difficulty in walking, not elsewhere classified (R26.2);Muscle weakness (generalized) (M62.81)    Time: 1610-9604 PT Time Calculation (min) (ACUTE ONLY): 26 min   Charges:   PT Evaluation $PT Eval Low Complexity: 1 Low PT Treatments $Therapeutic Activity: 8-22 mins PT General Charges $$ ACUTE PT VISIT: 1 Visit  Olga Coaster PT, DPT 3:16 PM,05/07/23

## 2023-05-07 NOTE — Evaluation (Signed)
Occupational Therapy Evaluation Patient Details Name: Tina Curtis MRN: [id number] [name]: [birth year] Today's Date: 05/07/2023   History of Present Illness Pt is a 89 y.o female with significant PMH of HTN, NSTEMI, chronic diastolic CHF, metastatic bone cancer, T2DM chronic headache, CVA on dual antiplatelet therapy, neuropathic pain, HLD, CKD, anxiety and depression, recent traumatic subarachnoid hemorrhage, recurrent falls who presented to the ED with chief complaints of traumatic fall. 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   Clinical Impression   Pt was seen for OT evaluation this date. Prior to hospital admission, pt was fairly independent, using a rollator for mobility and still driving. She reports falling while baking pies when she dropped an egg and then slipped on it. Pt's nieces present for session and supportive throughout. Pt presents to acute OT demonstrating impaired ADL performance and functional mobility 2/2 decreased balance, strength, LLE pain, and activity tolerance (See OT problem list for additional functional deficits). Pt currently requires +2 assist for all aspects of mobility, initial heavy posterior lean improving slightly with time and cues. Requires at least MOD A for LB ADL tasks. Pt would benefit from skilled OT services to address noted impairments and functional limitations (see below for any additional details) in order to maximize safety and independence while minimizing falls risk and caregiver burden.     If plan is discharge home, recommend the following: A lot of help with walking and/or transfers;A lot of help with bathing/dressing/bathroom;Assistance with cooking/housework;Assist for transportation;Help with stairs or ramp for entrance    Functional Status Assessment  Patient has had a recent decline in their functional status and demonstrates the ability to make significant improvements in function in a  reasonable and predictable amount of time.  Equipment Recommendations  None recommended by OT;Other (comment) (defer to next venue)    Recommendations for Other Services       Precautions / Restrictions Precautions Precautions: Fall Restrictions Weight Bearing Restrictions Per Provider Order: No      Mobility Bed Mobility Overal bed mobility: Needs Assistance Bed Mobility: Supine to Sit, Sit to Supine     Supine to sit: Mod assist, +2 for safety/equipment, HOB elevated, Used rails Sit to supine: Mod assist, +2 for safety/equipment, Used rails        Transfers Overall transfer level: Needs assistance Equipment used: Rolling walker (2 wheels) Transfers: Sit to/from Stand Sit to Stand: Min assist, Mod assist, +2 physical assistance           General transfer comment: performed twice, challenged with lift off as well as posterior lean. VC for hand placement      Balance Overall balance assessment: Needs assistance Sitting-balance support: Feet supported Sitting balance-Leahy Scale: Fair Sitting balance - Comments: intermittent posterior lean Postural control: Posterior lean Standing balance support: Bilateral upper extremity supported Standing balance-Leahy Scale: Poor Standing balance comment: posterior lean except once or twice in standing                           ADL either performed or assessed with clinical judgement   ADL Overall ADL's : Needs assistance/impaired                                       General ADL Comments: Pt currently requires +2 for ADL mobility, anticipate need for at least MOD  A for LB ADL tasks 2/2 poor balance and general weakness in addition to decreased activity tolerance, hx of OH, and LLE pain.     Vision         Perception         Praxis         Pertinent Vitals/Pain Pain Assessment Pain Assessment: Faces Faces Pain Scale: Hurts little more Pain Location: left leg Pain Descriptors /  Indicators: Grimacing Pain Intervention(s): Limited activity within patient's tolerance, Monitored during session, Repositioned     Extremity/Trunk Assessment Upper Extremity Assessment Upper Extremity Assessment: Generalized weakness   Lower Extremity Assessment Lower Extremity Assessment: Generalized weakness       Communication Communication Communication: Hearing impairment   Cognition Arousal: Alert Behavior During Therapy: WFL for tasks assessed/performed Overall Cognitive Status: Within Functional Limits for tasks assessed                                       General Comments       Exercises     Shoulder Instructions      Home Living Family/patient expects to be discharged to:: Private residence Living Arrangements: Alone Available Help at Discharge: Family;Available PRN/intermittently Type of Home: Independent living facility Home Access: Level entry     Home Layout: One level     Bathroom Shower/Tub: Producer, television/film/video: Handicapped height     Home Equipment: Rollator (4 wheels);Rolling Walker (2 wheels)      Lives With: Alone    Prior Functioning/Environment Prior Level of Function : Independent/Modified Independent;Driving             Mobility Comments: Mod indep with 3WRW in home, 4WRW in community; not driving anymore; reports bilat neuropathy, reports chronic orthostatic hypotension ADLs Comments: independent. Pt states she used to enjoy quilting (hand quilting), but due to macular degeneration doesn't do that anymore. 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        OT Problem List: Decreased strength;Decreased activity tolerance;Impaired balance (sitting and/or standing);Decreased knowledge of use of DME or AE;Pain      OT Treatment/Interventions: Self-care/ADL training;Therapeutic exercise;Therapeutic activities;DME and/or AE instruction;Patient/family education;Balance training    OT  Goals(Current goals can be found in the care plan section) Acute Rehab OT Goals Patient Stated Goal: get better OT Goal Formulation: With patient/family Time For Goal Achievement: 05/21/23 Potential to Achieve Goals: Good ADL Goals Pt Will Perform Upper Body Dressing: sitting;with modified independence Pt Will Perform Lower Body Dressing: sit to/from stand;with contact guard assist Pt Will Transfer to Toilet: ambulating;with contact guard assist (LRAD) Pt Will Perform Toileting - Clothing Manipulation and hygiene: sitting/lateral leans;with supervision Additional ADL Goal #1: Pt will verbalize plan for implementing learned falls prevention strategies.  OT Frequency: Min 1X/week    Co-evaluation PT/OT/SLP Co-Evaluation/Treatment: Yes Reason for Co-Treatment: To address functional/ADL transfers PT goals addressed during session: Mobility/safety with mobility;Balance;Proper use of DME OT goals addressed during session: ADL's and self-care;Proper use of Adaptive equipment and DME      AM-PAC OT "6 Clicks" Daily Activity     Outcome Measure Help from another person eating meals?: None Help from another person taking care of personal grooming?: A Little Help from another person toileting, which includes using toliet, bedpan, or urinal?: A Lot Help from another person bathing (including washing, rinsing, drying)?: A Lot Help from another person to put on and taking  off regular upper body clothing?: A Lot Help from another person to put on and taking off regular lower body clothing?: A Lot 6 Click Score: 15   End of Session Equipment Utilized During Treatment: Gait belt;Rolling walker (2 wheels) Nurse Communication: Mobility status  Activity Tolerance: Patient tolerated treatment well Patient left: in bed;with call bell/phone within reach;with bed alarm set;with family/visitor present  OT Visit Diagnosis: Other abnormalities of gait and mobility (R26.89);Repeated falls (R29.6);Muscle  weakness (generalized) (M62.81)                Time: 1610-9604 OT Time Calculation (min): 28 min Charges:  OT General Charges $OT Visit: 1 Visit OT Evaluation $OT Eval Moderate Complexity: 1 Mod  Arman Filter., MPH, MS, OTR/L ascom [phone] 05/07/23, 3:29 PM

## 2023-05-07 NOTE — TOC Initial Note (Signed)
Transition of Care [facility]) - Initial/Assessment Note    Patient Details  Name: Tina Curtis MRN: [id number] Date of Birth: May 13, 1926  [facility]) CM/SW Contact:    Liliana Cline, LCSW Phone Number: 05/07/2023, 12:52 PM  Clinical Narrative:                 Patient resides at [facility]. Spoke to [place] [facility], she states patient had recently been to their SNF [facility] for [employer], but had since discharged back to ILF. Patient can return to their SNF if needed. Per chart review - PT, OT, and Palliative consults are pending. TOC will continue to follow.  Expected Discharge Plan: Skilled Nursing Facility Barriers to Discharge: Continued Medical Work up   Patient Goals and CMS Choice            Expected Discharge Plan and Services       Living arrangements for the past 2 months: Independent Living Facility                                      Prior Living Arrangements/Services Living arrangements for the past 2 months: Independent Living Facility   Patient language and need for interpreter reviewed:: Yes        Need for Family Participation in Patient Care: Yes (Comment) Care giver support system in place?: Yes (comment)   Criminal Activity/Legal Involvement Pertinent to Current Situation/Hospitalization: No - Comment as needed  Activities of Daily Living      Permission Sought/Granted                  Emotional Assessment         Alcohol / Substance Use: Not Applicable Psych Involvement: No (comment)  Admission diagnosis:  Subdural hematoma (HCC) [S06.5XAA] Contusion of left hip, initial encounter [S70.02XA] Contusion of left knee, initial encounter [S80.02XA] Fall, initial encounter [W19.XXXA] Midline shift of brain due to hematoma (HCC) [S06.2XAA, S06.A0XA] Traumatic subdural hematoma without loss of consciousness, initial encounter [facility]) [S06.5X0A] Patient Active Problem List   Diagnosis Date  Noted   Subdural hematoma (HCC) 05/06/2023   Cancer, metastatic to bone (HCC) 04/05/2023   Temporal arteritis (HCC) 04/05/2023   Fall 03/29/2023   Bacteremia due to Streptococcus 08/31/2022   Sepsis due to Streptococcus species with acute hypoxic respiratory failure without septic shock (HCC) 08/27/2022   CHF NYHA class III, acute, diastolic (HCC) 08/27/2022   Hematemesis 08/26/2022   NSTEMI (non-ST elevated myocardial infarction) (HCC) 08/26/2022   Hypertensive urgency 08/26/2022   Hypokalemia 08/26/2022   Hypocalcemia 08/26/2022   Chronic diastolic CHF (congestive heart failure) (HCC) 08/26/2022   Acute blood loss anemia 08/26/2022   Hypomagnesemia 08/26/2022   Hypophosphatemia 08/26/2022   Type II diabetes mellitus with renal manifestations (HCC) 08/26/2022   Rash 08/26/2022   Chest pain 08/26/2022   Acute on chronic respiratory failure with hypoxia (HCC) 06/10/2022   Emphysema lung (HCC) 06/10/2022   Pneumonia due to infectious organism 06/07/2022   History of breast cancer 06/06/2022   Depression 06/06/2022   Orthostatic hypotension 06/06/2022   Dehydration 06/06/2022   Dizziness and giddiness 06/02/2022   Osteoporosis of forearm 03/28/2020   Breast cancer (HCC) 10/13/2019   Carcinoma of upper-inner quadrant of right breast in female, estrogen receptor positive (HCC) 09/27/2019   Anemia in chronic kidney disease 02/01/2019   Atherosclerosis of renal artery (HCC) 02/01/2019  Benign hypertensive kidney disease with chronic kidney disease 02/01/2019   Hyposmolality and/or hyponatremia 02/01/2019   Proteinuria 02/01/2019   Secondary hyperparathyroidism of renal origin (HCC) 02/01/2019   Anemia of chronic renal failure, stage 3b (HCC) 04/01/2018   Renovascular hypertension 04/01/2018   Renal artery stenosis (HCC) 04/01/2018   Nausea & vomiting 03/05/2018   AKI (acute kidney injury) (HCC) 02/17/2018   Acquired hammer toe of right foot 08/14/2014   Bunion of right foot  08/14/2014   Gout 08/14/2014   History of Clostridium difficile colitis 08/14/2014   Pure hypercholesterolemia 08/14/2014   RLS (restless legs syndrome) 08/14/2014   Hallux rigidus of right foot 04/24/2014   Primary osteoarthritis of foot 09/01/2013   Fatigue 09/30/2012   Gluteus medius or minimus syndrome 08/23/2012   Trochanteric bursitis 08/12/2012   Benign essential hypertension 09/03/2011   Diverticulosis of colon 09/03/2011   Osteopenia 09/03/2011   Enthesopathy of ankle and tarsus 04/13/2011   Osteoarthritis of spine with radiculopathy, lumbosacral region 09/30/2010   Allergic rhinitis 10/31/1928   PCP:  [name], MD Pharmacy:   [id number] [place], [place] - [address] AT [facility] OF [address] [place] [address] Phone: [phone] Fax: [phone]     Social Drivers of Health (SDOH) Social History: SDOH Screenings   Food Insecurity: No Food Insecurity (03/29/2023)  Housing: Low Risk  (03/29/2023)  Transportation Needs: No Transportation Needs (03/29/2023)  Utilities: Not At Risk (03/29/2023)  Financial Resource Strain: Low Risk  (11/17/2022)   Received from [facility]  Tobacco Use: Low Risk  (05/06/2023)   SDOH Interventions:     Readmission Risk Interventions    05/07/2023   12:51 PM 06/08/2022    3:30 PM  Readmission Risk Prevention Plan  Transportation Screening Complete Complete  PCP or Specialist Appt within 3-5 Days Complete   HRI or Home Care Consult Complete   Social Work Consult for Recovery Care Planning/Counseling Complete   Palliative Care Screening Complete Not Applicable  Medication Review Oceanographer) Complete Complete

## 2023-05-07 NOTE — Evaluation (Signed)
Speech Language Pathology Evaluation Patient Details Name: Tina Curtis MRN: [id number] [name]: 08-22-1926 Today's Date: 05/07/2023 Time: 9629-5284 SLP Time Calculation (min) (ACUTE ONLY): 35 min  Problem List:  Patient Active Problem List   Diagnosis Date Noted   Subdural hematoma (HCC) 05/06/2023   Cancer, metastatic to bone (HCC) 04/05/2023   Temporal arteritis (HCC) 04/05/2023   Fall 03/29/2023   Bacteremia due to Streptococcus 08/31/2022   Sepsis due to Streptococcus species with acute hypoxic respiratory failure without septic shock (HCC) 08/27/2022   CHF NYHA class III, acute, diastolic (HCC) 08/27/2022   Hematemesis 08/26/2022   NSTEMI (non-ST elevated myocardial infarction) (HCC) 08/26/2022   Hypertensive urgency 08/26/2022   Hypokalemia 08/26/2022   Hypocalcemia 08/26/2022   Chronic diastolic CHF (congestive heart failure) (HCC) 08/26/2022   Acute blood loss anemia 08/26/2022   Hypomagnesemia 08/26/2022   Hypophosphatemia 08/26/2022   Type II diabetes mellitus with renal manifestations (HCC) 08/26/2022   Rash 08/26/2022   Chest pain 08/26/2022   Acute on chronic respiratory failure with hypoxia (HCC) 06/10/2022   Emphysema lung (HCC) 06/10/2022   Pneumonia due to infectious organism 06/07/2022   History of breast cancer 06/06/2022   Depression 06/06/2022   Orthostatic hypotension 06/06/2022   Dehydration 06/06/2022   Dizziness and giddiness 06/02/2022   Osteoporosis of forearm 03/28/2020   Breast cancer (HCC) 10/13/2019   Carcinoma of upper-inner quadrant of right breast in female, estrogen receptor positive (HCC) 09/27/2019   Anemia in chronic kidney disease 02/01/2019   Atherosclerosis of renal artery (HCC) 02/01/2019   Benign hypertensive kidney disease with chronic kidney disease 02/01/2019   Hyposmolality and/or hyponatremia 02/01/2019   Proteinuria 02/01/2019   Secondary hyperparathyroidism of renal origin (HCC) 02/01/2019   Anemia of chronic renal  failure, stage 3b (HCC) 04/01/2018   Renovascular hypertension 04/01/2018   Renal artery stenosis (HCC) 04/01/2018   Nausea & vomiting 03/05/2018   AKI (acute kidney injury) (HCC) 02/17/2018   Acquired hammer toe of right foot 08/14/2014   Bunion of right foot 08/14/2014   Gout 08/14/2014   History of Clostridium difficile colitis 08/14/2014   Pure hypercholesterolemia 08/14/2014   RLS (restless legs syndrome) 08/14/2014   Hallux rigidus of right foot 04/24/2014   Primary osteoarthritis of foot 09/01/2013   Fatigue 09/30/2012   Gluteus medius or minimus syndrome 08/23/2012   Trochanteric bursitis 08/12/2012   Benign essential hypertension 09/03/2011   Diverticulosis of colon 09/03/2011   Osteopenia 09/03/2011   Enthesopathy of ankle and tarsus 04/13/2011   Osteoarthritis of spine with radiculopathy, lumbosacral region 09/30/2010   Allergic rhinitis 10/31/1928   Past Medical History:  Past Medical History:  Diagnosis Date   Arthritis    Gout   Chronic kidney disease    Diarrhea    Hypercholesteremia    Hypertension    Neuropathy    feet and lower legs   Seasonal allergies    Skin cancer    face   Past Surgical History:  Past Surgical History:  Procedure Laterality Date   ABDOMINAL HYSTERECTOMY  2000   APPENDECTOMY     BREAST BIOPSY Right 09/19/2019   affirm bx of calcs UOQ, x marker, path pending   BREAST BIOPSY Right 09/19/2019   [place] bx of mass,heart marker, path pending   BREAST BIOPSY Right 09/19/2019   [place] bx of LN, coil marker, path pending   CATARACT EXTRACTION Left    CATARACT EXTRACTION W/PHACO Right 10/24/2014   Procedure: CATARACT EXTRACTION PHACO AND INTRAOCULAR LENS PLACEMENT (IOC);  Surgeon: Lockie Mola, MD;  Location: [facility];  Service: Ophthalmology;  Laterality: Right;   ESOPHAGOGASTRODUODENOSCOPY N/A 03/07/2018   Procedure: ESOPHAGOGASTRODUODENOSCOPY (EGD);  Surgeon: Toney Reil, MD;  Location: [facility] END[facility];  Service:  Gastroenterology;  Laterality: N/A;   MASTECTOMY MODIFIED RADICAL Right 10/13/2019   Procedure: MASTECTOMY MODIFIED RADICAL;  Surgeon: Earline Mayotte, MD;  Location: [facility] ORS;  Service: General;  Laterality: Right;   RENAL ANGIOGRAPHY Right 04/11/2018   Procedure: RENAL ANGIOGRAPHY;  Surgeon: Annice Needy, MD;  Location: [facility] IN[facility]ASIVE CV [facility];  Service: Cardiovascular;  Laterality: Right;   SIMPLE MASTECTOMY WITH AXILLARY SENTINEL NODE BIOPSY Left 10/13/2019   Procedure: SIMPLE MASTECTOMY TRUE CUT BIOPSY, SENTINEL NODE BIOPSY;  Surgeon: Earline Mayotte, MD;  Location: [facility] ORS;  Service: General;  Laterality: Left;   HPI:  89 y.o female with significant PMH of HTN, NSTEMI, chronic diastolic CHF, metastatic bone cancer, T2DM chronic headache, CVA on dual antiplatelet therapy, neuropathic pain, HLD, CKD, anxiety and depression, recent traumatic subarachnoid hemorrhage, recurrent falls who presented to the ED with chief complaints of traumatic fall. CT head on 05/06/23 showed: "Mixed density subdural hematoma overlying the right cerebral  hemisphere, measuring 2 cm in thickness. A significant portion of the hematoma is comprised of acute components. Mass effect upon the underlying right cerebral hemisphere with 5 mm leftward midline  shift. 2. 3 mm hyperdense focus along the septum pellucidum, which could potentially reflect a small amount of hemorrhage within the septum pellucidum or within the left lateral ventricle." Neurosurgery consulted; pt and daughter opted not to have large craniotomy. Burr hole drainage discussed and opted to admit to ICU for monitoring and consideration of procedure should she have acute decline. Referred for cognitive language evaluation. Per charting, passed [employer] Screen.   Assessment / Plan / Recommendation Clinical Impression   Patient presents with appearance of stable/adequate cognitive communication function, consistent with prior baseline documented in SLP  evaluation in 2019. Patient was alert, oriented x4, and easily engaged in appropriate conversation. Administered Mini Mental State Examination (MMSE) and patient scored 28/30, missing only 1 word in delayed recall, and having difficulty copying a design (has macular degeneration). She was able to discuss recent events including her recent hospitalization, working with physical and occupational therapy at [facility], and details of the past 2 days. Appears to have adequate safety awareness, and was able to locate her call bell. She reports she feels she is at her baseline with regard to communication status. No further skilled ST is indicated in the acute setting, however if pt has decline in function, please reconsult for reassessment. Of note, patient passed [employer] Screen but remains NPO. Discussed with NSG, per protocol with MD approval pt may be played on regular diet. SLP to s/o.    SLP Assessment  SLP Recommendation/Assessment: Patient does not need any further Speech Lanaguage Pathology Services SLP Visit Diagnosis: Cognitive communication deficit (R41.841)    Recommendations for follow up therapy are one component of a multi-disciplinary discharge planning process, led by the attending physician.  Recommendations may be updated based on patient status, additional functional criteria and insurance authorization.    Follow Up Recommendations  No SLP follow up    Assistance Recommended at Discharge   (defer to PT recommendations for assessment of supervision needs with mobility)  Functional Status Assessment Patient has not had a recent decline in their functional status  Frequency and Duration  (evaluation only)  SLP Evaluation Cognition  Overall Cognitive Status: Within Functional Limits for tasks assessed Arousal/Alertness: Awake/alert Orientation Level: Oriented X4 Year: 2024 Month: December Day of Week: Correct Attention: Selective Selective Attention: Appears  intact Memory: Appears intact (able to summarize events of past 3 days) Awareness: Appears intact Problem Solving:  (Serial 7s 3/5 correct, suspect fatigue vs working memory) Psychologist, educational Function: Reasoning;Self Monitoring;Self Correcting Reasoning: Appears intact Self Monitoring: Appears intact Self Correcting: Appears intact Behaviors: Other (comment) (none observed) Safety/Judgment: Appears intact       Comprehension  Auditory Comprehension Overall Auditory Comprehension: Appears within functional limits for tasks assessed Yes/No Questions: Within Functional Limits Commands: Within Functional Limits Conversation: Complex Visual Recognition/Discrimination Discrimination: Within Function Limits Reading Comprehension Reading Status: Within funtional limits (sentence level for large print; pt has macular degeneration)    Expression Expression Primary Mode of Expression: Verbal Verbal Expression Overall Verbal Expression: Appears within functional limits for tasks assessed Initiation: No impairment Automatic Speech: Name;Social Response Level of Generative/Spontaneous Verbalization: Conversation Repetition: No impairment Naming: No impairment Pragmatics: No impairment Non-Verbal Means of Communication: Not applicable Written Expression Dominant Hand: Right Written Expression: Within Functional Limits   Oral / Motor  Oral Motor/Sensory Function Overall Oral Motor/Sensory Function: Within functional limits Motor Speech Overall Motor Speech: Appears within functional limits for tasks assessed Respiration: Within functional limits Phonation: Normal Resonance: Within functional limits Articulation: Within functional limitis Intelligibility: Intelligible Motor Planning: Witnin functional limits Motor Speech Errors: Not applicable           Rondel Baton, MS, CCC-SLP Speech-Language Pathologist Office: [phone]  Arlana Lindau 05/07/2023, 9:26 AM

## 2023-05-07 NOTE — Progress Notes (Addendum)
NAME:  ERMAL LOM, MRN:  [id number], [name]:  [birth year], LOS: 1 ADMISSION DATE:  05/06/2023, CONSULTATION DATE: 05/06/2023 REFERRING MD: Willy Eddy, CHIEF COMPLAINT: Traumatic fall  HPI  89 y.o female with significant PMH of HTN, NSTEMI, chronic diastolic CHF, metastatic bone cancer, T2DM chronic headache, CVA on dual antiplatelet therapy, neuropathic pain, HLD, CKD, anxiety and depression, recent traumatic subarachnoid hemorrhage, recurrent falls who presented to the ED with chief complaints of traumatic fall.  Per ED reports patient report that she fell 2 days ago hitting her head without loss of consciousness.  Recent admission with similar presentation found to have traumatic subdural hematoma.  She was on dual antiplatelet therapy.  ED Course: Initial vital signs showed HR of 67 beats/minute, BP 135/25mm Hg, the RR 16 breaths/minute, and the oxygen saturation 96% on RA and a temperature of 97.100F (36.6C).   Pertinent Labs/Diagnostics Findings: Na+/ K+: 136/4.3 Glucose:153  BUN/Cr.:34/1.31 Imaging Obtained XR of left knee and pelvis> CTH> CT pelvis and CT cervical spine> see detailed report CTH findings discussed with on-call neurosurgeon who after evaluating patient at the bedside and discussing treatment option with the patient and family, opted for conservative management with observation. Disposition: ICU  Past Medical History   tSDH12/26/2024   Cancer, metastatic to bone (HCC) 04/05/2023  Temporal arteritis (HCC) 04/05/2023  Fall 03/29/2023  Bacteremia due to Streptococcus 08/31/2022  Sepsis due to Streptococcus species with acute hypoxic respiratory failure without septic shock (HCC) 08/27/2022  CHF NYHA class III, acute, diastolic (HCC) 08/27/2022  Hematemesis 08/26/2022  NSTEMI (non-ST elevated myocardial infarction) (HCC) 08/26/2022  Hypertensive urgency 08/26/2022  Hypokalemia 08/26/2022  Hypocalcemia 08/26/2022  Chronic diastolic CHF (congestive heart  failure) (HCC) 08/26/2022  Acute blood loss anemia 08/26/2022  Hypomagnesemia 08/26/2022  Hypophosphatemia 08/26/2022  Type II diabetes mellitus with renal manifestations (HCC) 08/26/2022  Rash 08/26/2022  Chest pain 08/26/2022  Acute on chronic respiratory failure with hypoxia (HCC) 06/10/2022  Emphysema lung (HCC) 06/10/2022  Pneumonia due to infectious organism 06/07/2022  History of breast cancer 06/06/2022  Depression 06/06/2022  Orthostatic hypotension 06/06/2022  Dehydration 06/06/2022  Dizziness and giddiness 06/02/2022  Osteoporosis of forearm 03/28/2020  Breast cancer (HCC) 10/13/2019  Carcinoma of upper-inner quadrant of right breast in female, estrogen receptor positive (HCC) 09/27/2019  Anemia in chronic kidney disease 02/01/2019  Atherosclerosis of renal artery (HCC) 02/01/2019  Benign hypertensive kidney disease with chronic kidney disease 02/01/2019  Hyposmolality and/or hyponatremia 02/01/2019  Proteinuria 02/01/2019  Secondary hyperparathyroidism of renal origin (HCC) 02/01/2019  Anemia of chronic renal failure, stage 3b (HCC) 04/01/2018  Renovascular hypertension 04/01/2018  Renal artery stenosis (HCC) 04/01/2018  Nausea & vomiting 03/05/2018  AKI (acute kidney injury) (HCC) 02/17/2018  Acquired hammer toe of right foot 08/14/2014  Bunion of right foot 08/14/2014  Gout 08/14/2014  History of Clostridium difficile colitis 08/14/2014  Pure hypercholesterolemia 08/14/2014  RLS (restless legs syndrome) 08/14/2014  Hallux rigidus of right foot 04/24/2014  Primary osteoarthritis of foot 09/01/2013  Fatigue 09/30/2012  Gluteus medius or minimus syndrome 08/23/2012  Trochanteric bursitis 08/12/2012  Benign essential hypertension 09/03/2011  Diverticulosis of colon 09/03/2011  Osteopenia 09/03/2011  Enthesopathy of ankle and tarsus 04/13/2011  Osteoarthritis of spine with radiculopathy, lumbosacral region 09/30/2010  Allergic rhinitis 10/31/1928   Significant  Hospital Events   12/26: Admitted with acute on chronic traumatic subdural hematoma with midline shift and mass effect 12/27: Neuro exam unchanged, repeat CT Head stable.  Resuming home antihypertensives (Norvasc, Hydralazine, metoprolol) to help with weaning off Cleviprex  infusion.  Consults:  Neurosurgery  Procedures:  None  Significant Diagnostic Tests:  12/27: Noncontrast CT head> IMPRESSION: CT head:   1. Mixed density subdural hematoma overlying the right cerebral hemisphere, measuring 2 cm in thickness. A significant portion of the hematoma is comprised of acute components. Mass effect upon the underlying right cerebral hemisphere with 5 mm leftward midline shift. 2. 3 mm hyperdense focus along the septum pellucidum, which could potentially reflect a small amount of hemorrhage within the septum pellucidum or within the left lateral ventricle. Attention recommended on imaging follow-up.   CT cervical spine:   1. No evidence of an acute cervical spine fracture. 2. Mild grade 1 anterolisthesis at C7-T1, T1-T2 and T2-T3, unchanged from the prior cervical spine CT of 03/29/2023. 3. Cervical spondylosis as described. 4. 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  Interim History / Subjective:    -No significant events noted overnight -Neuro exams Remains stable with no change in repeat CT head yesterday evening -Will resume home Norvasc and Hydralazine to assist with weaning Cleviprex  Micro Data:  None  Antimicrobials:  None  OBJECTIVE  Blood pressure (!) 149/80, pulse 92, temperature 97.8 F (36.6 C), temperature source Oral, resp. rate 17, height 5\' 2"  (1.575 m), weight 63 kg, SpO2 100%.        Intake/Output Summary (Last 24 hours) at 05/07/2023 1134 Last data filed at 05/06/2023 1237 Gross per 24 hour  Intake --  Output 150 ml  Net -150 ml   Filed Weights   05/06/23 0950  Weight: 63 kg   Physical Examination  GENERAL: 89 year-old, acute on chronically  ill appearing frail patient lying in the bed in no acute distress EYES: PEERLA. No scleral icterus. Extraocular muscles intact.  HEENT: Head atraumatic, normocephalic. Oropharynx and nasopharynx clear.  NECK:  No JVD, supple  LUNGS: Normal breath sounds bilaterally.  No use of accessory muscles of respiration.  CARDIOVASCULAR: S1, S2 normal. No murmurs, rubs, or gallops.  ABDOMEN: Soft, NTND EXTREMITIES: Bruises.  Capillary refill < 3 seconds in all extremities. Pulses palpable distally. NEUROLOGIC: The patient is at and oriented x 3. No focal neurological deficit appreciated. Cranial nerves are intact.  SKIN: Multiple screen bruises as below         Labs/imaging that I havepersonally reviewed  (right click and "Reselect all SmartList Selections" daily)     Labs   CBC: Recent Labs  Lab 05/06/23 1051  WBC 7.7  NEUTROABS 4.8  HGB 11.1*  HCT 33.2*  MCV 94.1  PLT 218    Basic Metabolic Panel: Recent Labs  Lab 05/06/23 1051 05/07/23 0614  NA 136 140  K 4.3 4.3  CL 107 113*  CO2 19* 18*  GLUCOSE 153* 123*  BUN 34* 30*  CREATININE 1.31* 1.27*  CALCIUM 9.5 9.2   GFR: Estimated Creatinine Clearance: 23.6 mL/min (A) (by C-G formula based on SCr of 1.27 mg/dL (H)). Recent Labs  Lab 05/06/23 1051  WBC 7.7    Liver Function Tests: No results for input(s): "AST", "ALT", "ALKPHOS", "BILITOT", "PROT", "ALBUMIN" in the last 168 hours. No results for input(s): "LIPASE", "AMYLASE" in the last 168 hours. No results for input(s): "AMMONIA" in the last 168 hours.  ABG No results found for: "PHART", "PCO2ART", "PO2ART", "HCO3", "TCO2", "ACIDBASEDEF", "O2SAT"   Coagulation Profile: Recent Labs  Lab 05/06/23 1051  INR 1.0    Cardiac Enzymes: No results for input(s): "CKTOTAL", "CKMB", "CKMBINDEX", "TROPONINI" in the last 168 hours.  HbA1C: Hemoglobin A1C  Date/Time Value Ref Range Status  04/12/2023 12:00 AM 7.3  Final   Hgb A1c MFr Bld  Date/Time Value Ref  Range Status  08/26/2022 07:54 AM 7.0 (H) 4.8 - 5.6 % Final    Comment:    (NOTE) Pre diabetes:          5.7%-6.4%  Diabetes:              >6.4%  Glycemic control for   <7.0% adults with diabetes     CBG: No results for input(s): "GLUCAP" in the last 168 hours.  Review of Systems:   Positives in BOLD: Gen: Denies fever, chills, weight change, fatigue, night sweats HEENT: Denies blurred vision, double vision, hearing loss, tinnitus, sinus congestion, rhinorrhea, sore throat, neck stiffness, dysphagia PULM: Denies shortness of breath, cough, sputum production, hemoptysis, wheezing CV: Denies chest pain, edema, orthopnea, paroxysmal nocturnal dyspnea, palpitations GI: Denies abdominal pain, nausea, vomiting, diarrhea, hematochezia, melena, constipation, change in bowel habits GU: Denies dysuria, hematuria, polyuria, oliguria, urethral discharge Endocrine: Denies hot or cold intolerance, polyuria, polyphagia or appetite change Derm: Denies rash, dry skin, scaling or peeling skin change Heme: Denies easy bruising, bleeding, bleeding gums Neuro: Denies Mild headache, numbness, weakness, slurred speech, loss of memory or consciousness   Past Medical History  She,  has a past medical history of Arthritis, Chronic kidney disease, Diarrhea, Hypercholesteremia, Hypertension, Neuropathy, Seasonal allergies, and Skin cancer.   Surgical History    Past Surgical History:  Procedure Laterality Date   ABDOMINAL HYSTERECTOMY  2000   APPENDECTOMY     BREAST BIOPSY Right 09/19/2019   affirm bx of calcs UOQ, x marker, path pending   BREAST BIOPSY Right 09/19/2019   [place] bx of mass,heart marker, path pending   BREAST BIOPSY Right 09/19/2019   [place] bx of LN, coil marker, path pending   CATARACT EXTRACTION Left    CATARACT EXTRACTION W/PHACO Right 10/24/2014   Procedure: CATARACT EXTRACTION PHACO AND INTRAOCULAR LENS PLACEMENT (IOC);  Surgeon: Lockie Mola, MD;  Location: [facility];  Service: Ophthalmology;  Laterality: Right;   ESOPHAGOGASTRODUODENOSCOPY N/A 03/07/2018   Procedure: ESOPHAGOGASTRODUODENOSCOPY (EGD);  Surgeon: Toney Reil, MD;  Location[facility] ENDOSCOPY;  Service: Gastroenterology;  Laterality: N/A;   MASTECTOMY MODIFIED RADICAL Right 10/13/2019   Procedure: MASTECTOMY MODIFIED RADICAL;  Surgeon: Earline Mayotte, MD;  Location: [facility] ORS;  Service: General;  Laterality: Right;   RENAL ANGIOGRAPHY Right 04/11/2018   Procedure: RENAL ANGIOGRAPHY;  Surgeon: Annice Needy, MD;  Location: [facility] INVASIVE CV LAB;  Service: Cardiovascular;  Laterality: Right;   SIMPLE MASTECTOMY WITH AXILLARY SENTINEL NODE BIOPSY Left 10/13/2019   Procedure: SIMPLE MASTECTOMY TRUE CUT BIOPSY, SENTINEL NODE BIOPSY;  Surgeon: Earline Mayotte, MD;  Location: [facility] ORS;  Service: General;  Laterality: Left;   Social History   reports that she has never smoked. She has never used smokeless tobacco. She reports that she does not drink alcohol and does not use drugs.   Family History   Her family history includes Breast cancer (age of onset: 11) in her daughter; Hypertension in her father and mother; Stroke in her father.   Allergies Allergies  Allergen Reactions   Penicillins Anaphylaxis   Drug Ingredient [Zinc]    Empagliflozin Rash   Home Medications  Prior to Admission medications   Medication Sig Start Date End Date Taking? Authorizing Provider  acetaminophen (TYLENOL) 325 MG tablet Take 650 mg by mouth 3 (three) times daily.    [provider]  amLODipine (NORVASC) 5 MG tablet Take 1 tablet (5 mg total) by mouth every evening. Skip the dose if SBP less than 140 mmHg 04/02/23 04/01/24  Gillis Santa, MD  anastrozole (ARIMIDEX) 1 MG tablet Take 1 tablet (1 mg total) by mouth daily. 12/24/22   Earna Coder, MD  aspirin EC 81 MG tablet Take 1 tablet (81 mg total) by mouth daily. 04/05/23   Gillis Santa, MD  calcitRIOL (ROCALTROL) 0.25 MCG capsule Take  0.25 mcg by mouth as directed. Monday,Wednesday, Friday    [provider]  calcium carbonate (OS-CAL - DOSED IN MG OF ELEMENTAL CALCIUM) 1250 (500 Ca) MG tablet Take 1 tablet by mouth 2 (two) times daily with a meal.    [provider]  citalopram (CELEXA) 10 MG tablet Take 10 mg by mouth daily.  03/14/18   [provider]  clopidogrel (PLAVIX) 75 MG tablet Take 1 tablet (75 mg total) by mouth daily. 04/05/23   Gillis Santa, MD  cyanocobalamin (VITAMIN B12) 1000 MCG tablet Take 1,000 mcg by mouth daily.    [provider]  diclofenac Sodium (VOLTAREN) 1 % GEL Apply 2 g topically every 6 (six) hours. To bilateral legs.    [provider]  docusate sodium (COLACE) 100 MG capsule Take 100 mg by mouth daily as needed for mild constipation.    [provider]  DULoxetine (CYMBALTA) 30 MG capsule Take 30 mg by mouth daily. Patient not taking: Reported on 04/07/2023 02/02/23   [provider]  DULoxetine (CYMBALTA) 60 MG capsule Take 60 mg by mouth daily. 11/11/18   [provider]  gabapentin (NEURONTIN) 300 MG capsule Take 300 mg by mouth 2 (two) times daily.    [provider]  hydrALAZINE (APRESOLINE) 50 MG tablet Take 1 tablet (50 mg total) by mouth 3 (three) times daily. Skip the dose if SBP less than 130 mmHg 04/02/23 04/01/24  Gillis Santa, MD  loratadine (CLARITIN) 10 MG tablet Take 10 mg by mouth daily as needed for allergies. AM    [provider]  metoprolol succinate (TOPROL-XL) 25 MG 24 hr tablet Take 25 mg by mouth at bedtime.    [provider]  simvastatin (ZOCOR) 20 MG tablet Take 20 mg by mouth every evening. PM    [provider]  triamcinolone cream (KENALOG) 0.1 % Apply 1 Application topically 2 (two) times daily. 06/20/22 06/20/23  [provider]  valsartan (DIOVAN) 80 MG tablet Take 80 mg by mouth daily. 02/02/23   [provider]  Vitamin D, Ergocalciferol,  (DRISDOL) 1.25 MG (50000 UNIT) CAPS capsule Take 50,000 Units by mouth every 7 (seven) days.    [provider]  Wheat Dextrin (BENEFIBER ON THE GO) PACK Take 1 packet by mouth daily.    [provider]  Zinc Oxide (TRIPLE PASTE EX) Apply 1 application  topically as directed. Every shift    [provider]  Scheduled Meds:   stroke: early stages of recovery book   Does not apply Once   amLODipine  5 mg Oral Daily   hydrALAZINE  25 mg Oral Q8H   pantoprazole (PROTONIX) IV  40 mg Intravenous QHS   senna-docusate  1 tablet Oral BID   Continuous Infusions:  clevidipine 2 mg/hr (05/07/23 0033)   PRN Meds:.acetaminophen **OR** acetaminophen (TYLENOL) oral liquid 160 mg/5 mL **OR** acetaminophen   Active Hospital Problem list   See systems below  Assessment & Plan:   #Acute on Chronic traumatic Subdural  Hematoma -Goal SBP < 160  -Cleviprex Gtt as needed to maintain BP goal -Will resume home Norvasc, Hydralazine, and Metoprolol to assist with weaning drip  -HOB >= 30 degrees with aspiration precautions -Hold antiplatelets and anticoagulation for now. -IV fluids gentle hydration. -Repeat CT head if any change in neuro exam or worsening headache -Keep platelets >100k, INR<1.4 -Replete electrolytes as needed. -Normothermia - For temperature >37.5C - acetaminophen 650mg  q4-6 hours PRN. -Relative euglycemia (~ <180) and treat if hyperglycemia (>200 mg/dL)/hypoglycemia (< 60mg /dL).  -Precautions: Aspiration/seizure/fall. -Neurosurgery following, appreciate input ~ see recommendation for conservative management  #Hypertension #Hypertensive Urgency Hx of Diastolic CHF, NSTEMI, CVA on DUAP, HLD Home meds: Amlodipine, hydralazine, metoprolol, valsartan -Continue Cleviprex for systolic goal -Hold antiplatelets -Continue simvastatin -Long-term BP goal normotensive -Will resume home Amlodipine, hydralazine and metoprolol to assist with weaning Cleviprex  #CKD Stage  III -Monitor I&O's / urinary output -Follow BMP -Ensure adequate renal perfusion -Avoid nephrotoxic agents as able -Replace electrolytes as indicated   #T2Diabetes mellitus -CBGs -Sliding scale insulin -Follow ICU hyper/hypoglycemia protocol  #Anxiety and Depression -Continue home citalopram and Cymbalta   Best practice:  Diet:  Oral Pain/Anxiety/Delirium protocol (if indicated): No VAP protocol (if indicated): Not indicated DVT prophylaxis: Contraindicated (SCD's only) GI prophylaxis: N/A Glucose control:  SSI Yes Central venous access:  N/A Arterial line:  N/A Foley:  N/A Mobility:  bed rest  PT consulted: N/A Last date of multidisciplinary goals of care discussion [12/27] Code Status:  full code Disposition: FULL  12/27: Pt and her niece's updated at bedside on plan of care.  Critical care time: 40 minutes     Harlon Ditty, AGACNP-BC [facility] Prefer epic messenger for cross cover needs If after hours, please call E-link

## 2023-05-07 NOTE — ED Notes (Signed)
Pt assisted up to [facility]. Large BM noted. Pt assisted back to bed without incident

## 2023-05-08 DIAGNOSIS — Z515 Encounter for palliative care: Secondary | ICD-10-CM | POA: Diagnosis not present

## 2023-05-08 DIAGNOSIS — S065XAA Traumatic subdural hemorrhage with loss of consciousness status unknown, initial encounter: Secondary | ICD-10-CM | POA: Diagnosis not present

## 2023-05-08 DIAGNOSIS — I161 Hypertensive emergency: Secondary | ICD-10-CM | POA: Diagnosis not present

## 2023-05-08 DIAGNOSIS — N183 Chronic kidney disease, stage 3 unspecified: Secondary | ICD-10-CM | POA: Diagnosis not present

## 2023-05-08 DIAGNOSIS — E1122 Type 2 diabetes mellitus with diabetic chronic kidney disease: Secondary | ICD-10-CM

## 2023-05-08 DIAGNOSIS — Z794 Long term (current) use of insulin: Secondary | ICD-10-CM

## 2023-05-08 DIAGNOSIS — S8002XA Contusion of left knee, initial encounter: Secondary | ICD-10-CM

## 2023-05-08 DIAGNOSIS — S7002XA Contusion of left hip, initial encounter: Secondary | ICD-10-CM

## 2023-05-08 DIAGNOSIS — S065X0A Traumatic subdural hemorrhage without loss of consciousness, initial encounter: Secondary | ICD-10-CM | POA: Diagnosis not present

## 2023-05-08 LAB — CBC
HCT: 32 % — ABNORMAL LOW (ref 36.0–46.0)
Hemoglobin: 11 g/dL — ABNORMAL LOW (ref 12.0–15.0)
MCH: 31.6 pg (ref 26.0–34.0)
MCHC: 34.4 g/dL (ref 30.0–36.0)
MCV: 92 fL (ref 80.0–100.0)
Platelets: 204 10*3/uL (ref 150–400)
RBC: 3.48 MIL/uL — ABNORMAL LOW (ref 3.87–5.11)
RDW: 13.2 % (ref 11.5–15.5)
WBC: 6.9 10*3/uL (ref 4.0–10.5)
nRBC: 0 % (ref 0.0–0.2)

## 2023-05-08 LAB — URINALYSIS, COMPLETE (UACMP) WITH MICROSCOPIC
Bilirubin Urine: NEGATIVE
Glucose, UA: NEGATIVE mg/dL
Hgb urine dipstick: NEGATIVE
Ketones, ur: NEGATIVE mg/dL
Leukocytes,Ua: NEGATIVE
Nitrite: NEGATIVE
Protein, ur: 30 mg/dL — AB
Specific Gravity, Urine: 1.012 (ref 1.005–1.030)
pH: 5 (ref 5.0–8.0)

## 2023-05-08 LAB — RENAL FUNCTION PANEL
Albumin: 3.6 g/dL (ref 3.5–5.0)
Anion gap: 9 (ref 5–15)
BUN: 31 mg/dL — ABNORMAL HIGH (ref 8–23)
CO2: 19 mmol/L — ABNORMAL LOW (ref 22–32)
Calcium: 8.9 mg/dL (ref 8.9–10.3)
Chloride: 108 mmol/L (ref 98–111)
Creatinine, Ser: 1.31 mg/dL — ABNORMAL HIGH (ref 0.44–1.00)
GFR, Estimated: 38 mL/min — ABNORMAL LOW (ref >60)
Glucose, Bld: 136 mg/dL — ABNORMAL HIGH (ref 70–99)
Phosphorus: 3.3 mg/dL (ref 2.5–4.6)
Potassium: 3.7 mmol/L (ref 3.5–5.1)
Sodium: 136 mmol/L (ref 135–145)

## 2023-05-08 LAB — MAGNESIUM: Magnesium: 1.7 mg/dL (ref 1.7–2.4)

## 2023-05-08 LAB — GLUCOSE, CAPILLARY: Glucose-Capillary: 253 mg/dL — ABNORMAL HIGH (ref 70–99)

## 2023-05-08 LAB — TRIGLYCERIDES: Triglycerides: 146 mg/dL (ref <150)

## 2023-05-08 MED ORDER — MAGNESIUM SULFATE IN D5W 1-5 GM/100ML-% IV SOLN
1.0000 g | Freq: Once | INTRAVENOUS | Status: AC
  Administered 2023-05-08: 1 g via INTRAVENOUS
  Filled 2023-05-08: qty 100

## 2023-05-08 MED ORDER — DULOXETINE HCL 30 MG PO CPEP
60.0000 mg | ORAL_CAPSULE | Freq: Every day | ORAL | Status: DC
  Administered 2023-05-08 – 2023-05-11 (×4): 60 mg via ORAL
  Filled 2023-05-08 (×5): qty 2

## 2023-05-08 NOTE — Progress Notes (Signed)
Attending Progress Note  History: Tina Curtis is here for large subacute on chronic SDH.   HD3: mild confusion this am that has since resolved.  Denies any headaches.  Reports only stable/chronic left foot DF weakness HD2: Headache Resolved, weakness improving, patient complains mostly of left ankle pain at this moment.  HD1: Mild headache, generalized weakness.   Physical Exam: Vitals:   05/08/23 0724 05/08/23 0800  BP:  (!) 163/105  Pulse: 85 93  Resp: 17 17  Temp: 98.2 F (36.8 C)   SpO2: 100% 98%    AA Ox3 CNI Mild left drift Full strength in the Ues and Les except for left foot DF 0/5 (reports chronic issue)  Data:  Recent Labs  Lab 05/06/23 1051 05/07/23 0614 05/08/23 0402  NA 136 140 136  K 4.3 4.3 3.7  CL 107 113* 108  CO2 19* 18* 19*  BUN 34* 30* 31*  CREATININE 1.31* 1.27* 1.31*  GLUCOSE 153* 123* 136*  CALCIUM 9.5 9.2 8.9   No results for input(s): "AST", "ALT", "ALKPHOS" in the last 168 hours.  Invalid input(s): "TBILI"   Recent Labs  Lab 05/06/23 1051 05/07/23 0614 05/08/23 0402  WBC 7.7 7.1 6.9  HGB 11.1* 10.9* 11.0*  HCT 33.2* 32.6* 32.0*  PLT 218 207 204   Recent Labs  Lab 05/06/23 1051  INR 1.0         Other tests/results:  CT HEAD WO CONTRAST ( ) Result Date: 05/06/2023 CLINICAL DATA:  Subdural hematoma SDH expansion evaluation EXAM: CT HEAD WITHOUT CONTRAST TECHNIQUE: Contiguous axial images were obtained from the base of the skull through the vertex without intravenous contrast. RADIATION DOSE REDUCTION: This exam was performed according to the departmental dose-optimization program which includes automated exposure control, adjustment of the mA and/or kV according to patient size and/or use of iterative reconstruction technique. COMPARISON:  Same-day head CT FINDINGS: Brain: No significant change in size of the mixed density right cerebral convexity subdural hematoma measuring up to 2.9 cm in thickness. Unchanged 6 mm leftward  midline shift. No new sites of hemorrhage. No CT evidence of an acute cortical infarct. No hydrocephalus. Unchanged 2 mm hyperdense focus along the septum pellucidum Vascular: No hyperdense vessel or unexpected calcification. Skull: Normal. Negative for fracture or focal lesion. Sinuses/Orbits: Small right mastoid effusion. No middle ear effusion. Paranasal sinuses are clear. Bilateral lens replacement. Orbits are otherwise unremarkable. Other: None. IMPRESSION: 1. No significant change in size of the mixed density right cerebral convexity subdural hematoma measuring up to 2.9 cm in thickness. Unchanged 6 mm leftward midline shift. 2. Unchanged 2 mm hyperdense focus along the septum pellucidum. Electronically Signed   By: Lorenza Cambridge M.D.   On: 05/06/2023 17:08   CT Head Wo Contrast Result Date: 05/06/2023 CLINICAL DATA:  Provided history: Head trauma, minor. Neck trauma. Additional history provided: Fall. EXAM: CT HEAD WITHOUT CONTRAST CT CERVICAL SPINE WITHOUT CONTRAST TECHNIQUE: Multidetector CT imaging of the head and cervical spine was performed following the standard protocol without intravenous contrast. Multiplanar CT image reconstructions of the cervical spine were also generated. RADIATION DOSE REDUCTION: This exam was performed according to the departmental dose-optimization program which includes automated exposure control, adjustment of the mA and/or kV according to patient size and/or use of iterative reconstruction technique. COMPARISON:  Head CT 03/29/2023.  Cervical spine CT 03/29/2023. FINDINGS: CT HEAD FINDINGS Brain: Generalized cerebral atrophy. Mixed density subdural hematoma overlying the right cerebral hemisphere, measuring 2 cm in thickness. A significant portion of  the hematoma is comprised of hyperdense acute components. There is mass effect upon the underlying right cerebral hemisphere with 5 mm leftward midline shift. 3 mm hyperdense focus along the septum pellucidum, new from the  prior head CT of 03/29/2023. This could potentially reflect a small amount of hemorrhage within the septum pellucidum or within the left lateral ventricle (series 2, image 16) (series 4, image 33). Sequelae of mild chronic small vessel ischemic disease. Prominent perivascular space within the left basal ganglia inferiorly. No demarcated cortical infarct. No evidence of an intracranial mass. Vascular: No hyperdense vessel.  Atherosclerotic calcifications. Skull: No calvarial fracture or aggressive osseous lesion. Sinuses/Orbits: No mass or acute finding within the imaged orbits. No significant paranasal sinus disease at the imaged levels. CT CERVICAL SPINE FINDINGS Alignment: 2 mm grade 1 anterolisthesis at C7-T1, T1-T2 and T2-T3. Skull base and vertebrae: The basion-dental and atlanto-dental intervals are maintained.No evidence of acute fracture to the cervical spine. 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 Soft tissues and spinal canal: No prevertebral fluid or swelling. No visible canal hematoma. Disc levels: Cervical spondylosis with multilevel disc space narrowing, disc bulges/central disc protrusions, posterior disc osteophyte complexes, uncovertebral hypertrophy and facet arthrosis. Disc space narrowing is greatest at C3-C4 (advanced), C5-C6 (moderate-to-advanced), C6-C7 (moderate-to-advanced) and T2-T3 (moderate-to-advanced). No appreciable high-grade spinal canal stenosis. Multilevel bony neural foraminal narrowing. Upper chest: No acute finding. CT head results were called by telephone at the time of interpretation on 05/06/2023 at 11:48 am to provider [facility] , who verbally acknowledged these results. IMPRESSION: CT head: 1. Mixed density subdural hematoma overlying the right cerebral hemisphere, measuring 2 cm in thickness. A significant portion of the hematoma is comprised of acute components. Mass effect upon the underlying right cerebral hemisphere with 5 mm leftward midline shift. 2. 3 mm hyperdense  focus along the septum pellucidum, which could potentially reflect a small amount of hemorrhage within the septum pellucidum or within the left lateral ventricle. Attention recommended on imaging follow-up. CT cervical spine: 1. No evidence of an acute cervical spine fracture. 2. Mild grade 1 anterolisthesis at C7-T1, T1-T2 and T2-T3, unchanged from the prior cervical spine CT of 03/29/2023. 3. Cervical spondylosis as described. 4. 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 Electronically Signed   By: Jackey Loge D.O.   On: 05/06/2023 12:02   CT Cervical Spine Wo Contrast Result Date: 05/06/2023 CLINICAL DATA:  Provided history: Head trauma, minor. Neck trauma. Additional history provided: Fall. EXAM: CT HEAD WITHOUT CONTRAST CT CERVICAL SPINE WITHOUT CONTRAST TECHNIQUE: Multidetector CT imaging of the head and cervical spine was performed following the standard protocol without intravenous contrast. Multiplanar CT image reconstructions of the cervical spine were also generated. RADIATION DOSE REDUCTION: This exam was performed according to the departmental dose-optimization program which includes automated exposure control, adjustment of the mA and/or kV according to patient size and/or use of iterative reconstruction technique. COMPARISON:  Head CT 03/29/2023.  Cervical spine CT 03/29/2023. FINDINGS: CT HEAD FINDINGS Brain: Generalized cerebral atrophy. Mixed density subdural hematoma overlying the right cerebral hemisphere, measuring 2 cm in thickness. A significant portion of the hematoma is comprised of hyperdense acute components. There is mass effect upon the underlying right cerebral hemisphere with 5 mm leftward midline shift. 3 mm hyperdense focus along the septum pellucidum, new from the prior head CT of 03/29/2023. This could potentially reflect a small amount of hemorrhage within the septum pellucidum or within the left lateral ventricle (series 2, image 16) (series 4, image 33).  Sequelae of mild  chronic small vessel ischemic disease. Prominent perivascular space within the left basal ganglia inferiorly. No demarcated cortical infarct. No evidence of an intracranial mass. Vascular: No hyperdense vessel.  Atherosclerotic calcifications. Skull: No calvarial fracture or aggressive osseous lesion. Sinuses/Orbits: No mass or acute finding within the imaged orbits. No significant paranasal sinus disease at the imaged levels. CT CERVICAL SPINE FINDINGS Alignment: 2 mm grade 1 anterolisthesis at C7-T1, T1-T2 and T2-T3. Skull base and vertebrae: The basion-dental and atlanto-dental intervals are maintained.No evidence of acute fracture to the cervical spine. 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 Soft tissues and spinal canal: No prevertebral fluid or swelling. No visible canal hematoma. Disc levels: Cervical spondylosis with multilevel disc space narrowing, disc bulges/central disc protrusions, posterior disc osteophyte complexes, uncovertebral hypertrophy and facet arthrosis. Disc space narrowing is greatest at C3-C4 (advanced), C5-C6 (moderate-to-advanced), C6-C7 (moderate-to-advanced) and T2-T3 (moderate-to-advanced). No appreciable high-grade spinal canal stenosis. Multilevel bony neural foraminal narrowing. Upper chest: No acute finding. CT head results were called by telephone at the time of interpretation on 05/06/2023 at 11:48 am to provider [facility] , who verbally acknowledged these results. IMPRESSION: CT head: 1. Mixed density subdural hematoma overlying the right cerebral hemisphere, measuring 2 cm in thickness. A significant portion of the hematoma is comprised of acute components. Mass effect upon the underlying right cerebral hemisphere with 5 mm leftward midline shift. 2. 3 mm hyperdense focus along the septum pellucidum, which could potentially reflect a small amount of hemorrhage within the septum pellucidum or within the left lateral ventricle. Attention recommended on imaging follow-up. CT  cervical spine: 1. No evidence of an acute cervical spine fracture. 2. Mild grade 1 anterolisthesis at C7-T1, T1-T2 and T2-T3, unchanged from the prior cervical spine CT of 03/29/2023. 3. Cervical spondylosis as described. 4. 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 Electronically Signed   By: Jackey Loge D.O.   On: 05/06/2023 12:02    Assessment/Plan:  Tina Curtis is a very pleasant 89 year old woman with cancer, hypertension, A-fib on antiplatelets who presented to the emergency department after a fall.  Notably she was admitted for a fall approximately 1-1/2 months ago.  On her screening she was found to have a large right sided mixed density subdural hematoma with approximately 5 mm of midline shift right to left.  Currently without significant symptoms that are directly attributable to the SDH.  Extensive conversation held with the family/patient and they would not like aggressive intervention unless clinically declines.  -continue conservative therapy -ok to transition out of the ICU and reduce neuro checks -would not recommend anti-epileptic given her age and without concerns for ictal events -Hold antiplatelet medication given the SDH -PT/OT, dispo planning

## 2023-05-08 NOTE — Progress Notes (Signed)
PHARMACY CONSULT NOTE - FOLLOW UP  Pharmacy Consult for Electrolyte Monitoring and Replacement   Recent Labs: Potassium (mmol/L)  Date Value  05/08/2023 3.7   Magnesium (mg/dL)  Date Value  16/02/9603 1.7   Calcium (mg/dL)  Date Value  54/01/8118 8.9   Albumin (g/dL)  Date Value  14/78/2956 3.6   Phosphorus (mg/dL)  Date Value  21/30/8657 3.3   Sodium  Date Value  05/08/2023 136 mmol/L  04/12/2023 140     Assessment: 89 y.o female with significant PMH of HTN, NSTEMI, chronic diastolic CHF, metastatic bone cancer, T2DM chronic headache, CVA on dual antiplatelet therapy, neuropathic pain, HLD, CKD, anxiety and depression, recent traumatic subarachnoid hemorrhage, recurrent falls who presented to the ED with chief complaints of traumatic fall. Pharmacy is asked to follow and replace electrolytes while in CCU  Goal of Therapy:  Electrolytes WNL  Plan:  ---1 gram IV magnesium sulfate x 1 ---recheck electrolytes while in am  Lowella Bandy ,PharmD Clinical Pharmacist 05/08/2023 7:03 AM

## 2023-05-08 NOTE — Plan of Care (Signed)
  Problem: Education: Goal: Knowledge of disease or condition will improve Outcome: Progressing Goal: Knowledge of secondary prevention will improve (MUST DOCUMENT ALL) Outcome: Progressing Goal: Knowledge of patient specific risk factors will improve Loraine Leriche N/A or DELETE if not current risk factor) Outcome: Progressing   Problem: Intracerebral Hemorrhage Tissue Perfusion: Goal: Complications of Intracerebral Hemorrhage will be minimized Outcome: Progressing   Problem: Coping: Goal: Will verbalize positive feelings about self Outcome: Progressing Goal: Will identify appropriate support needs Outcome: Progressing   Problem: Health Behavior/Discharge Planning: Goal: Ability to manage health-related needs will improve Outcome: Progressing Goal: Goals will be collaboratively established with patient/family Outcome: Progressing   Problem: Nutrition: Goal: Risk of aspiration will decrease Outcome: Progressing Goal: Dietary intake will improve Outcome: Progressing   Problem: Education: Goal: Knowledge of General Education information will improve Description: Including pain rating scale, medication(s)/side effects and non-pharmacologic comfort measures Outcome: Progressing

## 2023-05-08 NOTE — Progress Notes (Signed)
NAME:  Tina Curtis, MRN:  [id number], [name]:  10/28/1926, LOS: 2 ADMISSION DATE:  05/06/2023, CONSULTATION DATE: 05/06/2023 REFERRING MD: Willy Eddy, CHIEF COMPLAINT: Traumatic fall  HPI  89 y.o female with significant PMH of HTN, NSTEMI, chronic diastolic CHF, metastatic bone cancer, T2DM chronic headache, CVA on dual antiplatelet therapy, neuropathic pain, HLD, CKD, anxiety and depression, recent traumatic subarachnoid hemorrhage, recurrent falls who presented to the ED with chief complaints of traumatic fall.  Per ED reports patient report that she fell 2 days ago hitting her head without loss of consciousness.  Recent admission with similar presentation found to have traumatic subdural hematoma.  She was on dual antiplatelet therapy.  ED Course: Initial vital signs showed HR of 67 beats/minute, BP 135/59mm Hg, the RR 16 breaths/minute, and the oxygen saturation 96% on RA and a temperature of 97.27F (36.6C).   Pertinent Labs/Diagnostics Findings: Na+/ K+: 136/4.3 Glucose:153  BUN/Cr.:34/1.31 Imaging Obtained XR of left knee and pelvis> CTH> CT pelvis and CT cervical spine> see detailed report CTH findings discussed with on-call neurosurgeon who after evaluating patient at the bedside and discussing treatment option with the patient and family, opted for conservative management with observation. Disposition: ICU  Past Medical History   tSDH12/26/2024   Cancer, metastatic to bone (HCC) 04/05/2023  Temporal arteritis (HCC) 04/05/2023  Fall 03/29/2023  Bacteremia due to Streptococcus 08/31/2022  Sepsis due to Streptococcus species with acute hypoxic respiratory failure without septic shock (HCC) 08/27/2022  CHF NYHA class III, acute, diastolic (HCC) 08/27/2022  Hematemesis 08/26/2022  NSTEMI (non-ST elevated myocardial infarction) (HCC) 08/26/2022  Hypertensive urgency 08/26/2022  Hypokalemia 08/26/2022  Hypocalcemia 08/26/2022  Chronic diastolic CHF (congestive heart  failure) (HCC) 08/26/2022  Acute blood loss anemia 08/26/2022  Hypomagnesemia 08/26/2022  Hypophosphatemia 08/26/2022  Type II diabetes mellitus with renal manifestations (HCC) 08/26/2022  Rash 08/26/2022  Chest pain 08/26/2022  Acute on chronic respiratory failure with hypoxia (HCC) 06/10/2022  Emphysema lung (HCC) 06/10/2022  Pneumonia due to infectious organism 06/07/2022  History of breast cancer 06/06/2022  Depression 06/06/2022  Orthostatic hypotension 06/06/2022  Dehydration 06/06/2022  Dizziness and giddiness 06/02/2022  Osteoporosis of forearm 03/28/2020  Breast cancer (HCC) 10/13/2019  Carcinoma of upper-inner quadrant of right breast in female, estrogen receptor positive (HCC) 09/27/2019  Anemia in chronic kidney disease 02/01/2019  Atherosclerosis of renal artery (HCC) 02/01/2019  Benign hypertensive kidney disease with chronic kidney disease 02/01/2019  Hyposmolality and/or hyponatremia 02/01/2019  Proteinuria 02/01/2019  Secondary hyperparathyroidism of renal origin (HCC) 02/01/2019  Anemia of chronic renal failure, stage 3b (HCC) 04/01/2018  Renovascular hypertension 04/01/2018  Renal artery stenosis (HCC) 04/01/2018  Nausea & vomiting 03/05/2018  AKI (acute kidney injury) (HCC) 02/17/2018  Acquired hammer toe of right foot 08/14/2014  Bunion of right foot 08/14/2014  Gout 08/14/2014  History of Clostridium difficile colitis 08/14/2014  Pure hypercholesterolemia 08/14/2014  RLS (restless legs syndrome) 08/14/2014  Hallux rigidus of right foot 04/24/2014  Primary osteoarthritis of foot 09/01/2013  Fatigue 09/30/2012  Gluteus medius or minimus syndrome 08/23/2012  Trochanteric bursitis 08/12/2012  Benign essential hypertension 09/03/2011  Diverticulosis of colon 09/03/2011  Osteopenia 09/03/2011  Enthesopathy of ankle and tarsus 04/13/2011  Osteoarthritis of spine with radiculopathy, lumbosacral region 09/30/2010  Allergic rhinitis 10[birth year]28   Significant  Hospital Events   12/26: Admitted with acute on chronic traumatic subdural hematoma with midline shift and mass effect 12/27: Neuro exam unchanged, repeat CT Head stable.  Resuming home antihypertensives (Norvasc, Hydralazine, metoprolol) to help with weaning off Cleviprex  infusion. 12/28: Neuro exam stable.  Weaned of Cleviprex after addition of outpatient hypertensives.  Per Neurosurgery ok to transfer to floor and downgrade neuro checks  to q4h.  PT/OT consulted.  Consults:  Neurosurgery  Procedures:  None  Significant Diagnostic Tests:  12/27: Noncontrast CT head> IMPRESSION: CT head:   1. Mixed density subdural hematoma overlying the right cerebral hemisphere, measuring 2 cm in thickness. A significant portion of the hematoma is comprised of acute components. Mass effect upon the underlying right cerebral hemisphere with 5 mm leftward midline shift. 2. 3 mm hyperdense focus along the septum pellucidum, which could potentially reflect a small amount of hemorrhage within the septum pellucidum or within the left lateral ventricle. Attention recommended on imaging follow-up.   CT cervical spine:   1. No evidence of an acute cervical spine fracture. 2. Mild grade 1 anterolisthesis at C7-T1, T1-T2 and T2-T3, unchanged from the prior cervical spine CT of 03/29/2023. 3. Cervical spondylosis as described. 4. 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  Interim History / Subjective:    -No significant events noted overnight -Neuro exams Remains stable with no change in repeat CT head yesterday evening -Will resume home Norvasc and Hydralazine to assist with weaning Cleviprex  Micro Data:  None  Antimicrobials:  None  OBJECTIVE  Blood pressure (!) 150/83, pulse 71, temperature 98.2 F (36.8 C), temperature source Oral, resp. rate 18, height 5\' 2"  (1.575 m), weight 62.7 kg, SpO2 98%.        Intake/Output Summary (Last 24 hours) at 05/08/2023 1501 Last data filed at 05/08/2023  1334 Gross per 24 hour  Intake 557.07 ml  Output 1275 ml  Net -717.93 ml   Filed Weights   05/06/23 0950 05/07/23 1201  Weight: 63 kg 62.7 kg   Physical Examination  GENERAL: 89 year-old, acute on chronically ill appearing frail patient lying in the bed in no acute distress EYES: PEERLA. No scleral icterus. Extraocular muscles intact.  HEENT: Head atraumatic, normocephalic. Oropharynx and nasopharynx clear.  NECK:  No JVD, supple  LUNGS: Normal breath sounds bilaterally.  No use of accessory muscles of respiration.  CARDIOVASCULAR: S1, S2 normal. No murmurs, rubs, or gallops.  ABDOMEN: Soft, NTND EXTREMITIES: Bruises.  Capillary refill < 3 seconds in all extremities. Pulses palpable distally. NEUROLOGIC: The patient is at and oriented x 3. No focal neurological deficit appreciated. Cranial nerves are intact.  SKIN: Multiple screen bruises as below         Labs/imaging that I havepersonally reviewed  (right click and "Reselect all SmartList Selections" daily)     Labs   CBC: Recent Labs  Lab 05/06/23 1051 05/07/23 0614 05/08/23 0402  WBC 7.7 7.1 6.9  NEUTROABS 4.8  --   --   HGB 11.1* 10.9* 11.0*  HCT 33.2* 32.6* 32.0*  MCV 94.1 95.3 92.0  PLT 218 207 204    Basic Metabolic Panel: Recent Labs  Lab 05/06/23 1051 05/07/23 0614 05/08/23 0402  NA 136 140 136  K 4.3 4.3 3.7  CL 107 113* 108  CO2 19* 18* 19*  GLUCOSE 153* 123* 136*  BUN 34* 30* 31*  CREATININE 1.31* 1.27* 1.31*  CALCIUM 9.5 9.2 8.9  MG  --   --  1.7  PHOS  --   --  3.3   GFR: Estimated Creatinine Clearance: 22.8 mL/min (A) (by C-G formula based on SCr of 1.31 mg/dL (H)). Recent Labs  Lab 05/06/23 1051 05/07/23 0614 05/08/23 0402  WBC 7.7 7.1 6.9  Liver Function Tests: Recent Labs  Lab 05/08/23 0402  ALBUMIN 3.6   No results for input(s): "LIPASE", "AMYLASE" in the last 168 hours. No results for input(s): "AMMONIA" in the last 168 hours.  ABG No results found for:  "PHART", "PCO2ART", "PO2ART", "HCO3", "TCO2", "ACIDBASEDEF", "O2SAT"   Coagulation Profile: Recent Labs  Lab 05/06/23 1051  INR 1.0    Cardiac Enzymes: No results for input(s): "CKTOTAL", "CKMB", "CKMBINDEX", "TROPONINI" in the last 168 hours.  HbA1C: Hemoglobin A1C  Date/Time Value Ref Range Status  04/12/2023 12:00 AM 7.3  Final   Hgb A1c MFr Bld  Date/Time Value Ref Range Status  08/26/2022 07:54 AM 7.0 (H) 4.8 - 5.6 % Final    Comment:    (NOTE) Pre diabetes:          5.7%-6.4%  Diabetes:              >6.4%  Glycemic control for   <7.0% adults with diabetes     CBG: Recent Labs  Lab 05/07/23 1204  GLUCAP 102*    Review of Systems:   Positives in BOLD: Gen: Denies fever, chills, weight change, fatigue, night sweats HEENT: Denies blurred vision, double vision, hearing loss, tinnitus, sinus congestion, rhinorrhea, sore throat, neck stiffness, dysphagia PULM: Denies shortness of breath, cough, sputum production, hemoptysis, wheezing CV: Denies chest pain, edema, orthopnea, paroxysmal nocturnal dyspnea, palpitations GI: Denies abdominal pain, nausea, vomiting, diarrhea, hematochezia, melena, constipation, change in bowel habits GU: Denies dysuria, hematuria, polyuria, oliguria, urethral discharge Endocrine: Denies hot or cold intolerance, polyuria, polyphagia or appetite change Derm: Denies rash, dry skin, scaling or peeling skin change Heme: Denies easy bruising, bleeding, bleeding gums Neuro: Denies Mild headache, numbness, weakness, slurred speech, loss of memory or consciousness   Past Medical History  She,  has a past medical history of Arthritis, Chronic kidney disease, Diarrhea, Hypercholesteremia, Hypertension, Neuropathy, Seasonal allergies, and Skin cancer.   Surgical History    Past Surgical History:  Procedure Laterality Date   ABDOMINAL HYSTERECTOMY  2000   APPENDECTOMY     BREAST BIOPSY Right 09/19/2019   affirm bx of calcs UOQ, x marker,  path pending   BREAST BIOPSY Right 09/19/2019   [place] bx of mass,heart marker, path pending   BREAST BIOPSY Right 09/19/2019   [place] bx of LN, coil marker, path pending   CATARACT EXTRACTION Left    CATARACT EXTRACTION W/PHACO Right 10/24/2014   Procedure: CATARACT EXTRACTION PHACO AND INTRAOCULAR LENS PLACEMENT (IOC);  Surgeon: Lockie Mola, MD;  Location: [facility];  Service: Ophthalmology;  Laterality: Right;   ESOPHAGOGASTRODUODENOSCOPY N/A 03/07/2018   Procedure: ESOPHAGOGASTRODUODENOSCOPY (EGD);  Surgeon: Toney Reil, MD;  Location: [facility];  Service: Gastroenterology;  Laterality: N/A;   MASTECTOMY MODIFIED RADICAL Right 10/13/2019   Procedure: MASTECTOMY MODIFIED RADICAL;  Surgeon: Earline Mayotte, MD;  Location: [facility] ORS;  Service: General;  Laterality: Right;   RENAL ANGIOGRAPHY Right 04/11/2018   Procedure: RENAL ANGIOGRAPHY;  Surgeon: Annice Needy, MD;  Location: [facility] INVASIVE CV LAB;  Service: Cardiovascular;  Laterality: Right;   SIMPLE MASTECTOMY WITH AXILLARY SENTINEL NODE BIOPSY Left 10/13/2019   Procedure: SIMPLE MASTECTOMY TRUE CUT BIOPSY, SENTINEL NODE BIOPSY;  Surgeon: Earline Mayotte, MD;  Location: [facility] ORS;  Service: General;  Laterality: Left;   Social History   reports that she has never smoked. She has never used smokeless tobacco. She reports that she does not drink alcohol and does not use drugs.   Family History  Her family history includes Breast cancer (age of onset: 75) in her daughter; Hypertension in her father and mother; Stroke in her father.   Allergies Allergies  Allergen Reactions   Penicillins Anaphylaxis   Drug Ingredient [Zinc]    Empagliflozin Rash   Home Medications  Prior to Admission medications   Medication Sig Start Date End Date Taking? Authorizing Provider  acetaminophen (TYLENOL) 325 MG tablet Take 650 mg by mouth 3 (three) times daily.    [provider]  amLODipine (NORVASC) 5 MG tablet Take 1  tablet (5 mg total) by mouth every evening. Skip the dose if SBP less than 140 mmHg 04/02/23 04/01/24  Gillis Santa, MD  anastrozole (ARIMIDEX) 1 MG tablet Take 1 tablet (1 mg total) by mouth daily. 12/24/22   Earna Coder, MD  aspirin EC 81 MG tablet Take 1 tablet (81 mg total) by mouth daily. 04/05/23   Gillis Santa, MD  calcitRIOL (ROCALTROL) 0.25 MCG capsule Take 0.25 mcg by mouth as directed. Monday,Wednesday, Friday    [provider]  calcium carbonate (OS-CAL - DOSED IN MG OF ELEMENTAL CALCIUM) 1250 (500 Ca) MG tablet Take 1 tablet by mouth 2 (two) times daily with a meal.    [provider]  citalopram (CELEXA) 10 MG tablet Take 10 mg by mouth daily.  03/14/18   [provider]  clopidogrel (PLAVIX) 75 MG tablet Take 1 tablet (75 mg total) by mouth daily. 04/05/23   Gillis Santa, MD  cyanocobalamin (VITAMIN B12) 1000 MCG tablet Take 1,000 mcg by mouth daily.    [provider]  diclofenac Sodium (VOLTAREN) 1 % GEL Apply 2 g topically every 6 (six) hours. To bilateral legs.    [provider]  docusate sodium (COLACE) 100 MG capsule Take 100 mg by mouth daily as needed for mild constipation.    [provider]  DULoxetine (CYMBALTA) 30 MG capsule Take 30 mg by mouth daily. Patient not taking: Reported on 04/07/2023 02/02/23   [provider]  DULoxetine (CYMBALTA) 60 MG capsule Take 60 mg by mouth daily. 11/11/18   [provider]  gabapentin (NEURONTIN) 300 MG capsule Take 300 mg by mouth 2 (two) times daily.    [provider]  hydrALAZINE (APRESOLINE) 50 MG tablet Take 1 tablet (50 mg total) by mouth 3 (three) times daily. Skip the dose if SBP less than 130 mmHg 04/02/23 04/01/24  Gillis Santa, MD  loratadine (CLARITIN) 10 MG tablet Take 10 mg by mouth daily as needed for allergies. AM    [provider]  metoprolol succinate (TOPROL-XL) 25 MG 24 hr tablet Take 25 mg by mouth at bedtime.     [provider]  simvastatin (ZOCOR) 20 MG tablet Take 20 mg by mouth every evening. PM    [provider]  triamcinolone cream (KENALOG) 0.1 % Apply 1 Application topically 2 (two) times daily. 06/20/22 06/20/23  [provider]  valsartan (DIOVAN) 80 MG tablet Take 80 mg by mouth daily. 02/02/23   [provider]  Vitamin D, Ergocalciferol, (DRISDOL) 1.25 MG (50000 UNIT) CAPS capsule Take 50,000 Units by mouth every 7 (seven) days.    [provider]  Wheat Dextrin (BENEFIBER ON THE GO) PACK Take 1 packet by mouth daily.    [provider]  Zinc Oxide (TRIPLE PASTE EX) Apply 1 application  topically as directed. Every shift    [provider]  Scheduled Meds:   stroke: early stages of recovery book  Does not apply Once   amLODipine  10 mg Oral Daily   Chlorhexidine Gluconate Cloth  6 each Topical Daily   diclofenac Sodium  4 g Topical QID   DULoxetine  60 mg Oral Daily   gabapentin  300 mg Oral BID   hydrALAZINE  50 mg Oral Q8H   melatonin  2.5 mg Oral QHS   metoprolol tartrate  12.5 mg Oral BID   pantoprazole (PROTONIX) IV  40 mg Intravenous QHS   senna-docusate  1 tablet Oral BID   Continuous Infusions:   PRN Meds:.acetaminophen **OR** acetaminophen (TYLENOL) oral liquid 160 mg/5 mL **OR** acetaminophen   Active Hospital Problem list   See systems below  Assessment & Plan:   #Acute on Chronic traumatic Subdural Hematoma -Goal SBP < 160  -Cleviprex Gtt weaned off -Continue home Norvasc, Hydralazine, and Metoprolol -HOB >= 30 degrees with aspiration precautions -Hold antiplatelets and anticoagulation for now. -IV fluids gentle hydration. -Repeat CT head if any change in neuro exam or worsening headache -Keep platelets >100k, INR<1.4 -Replete electrolytes as needed. -Normothermia - For temperature >37.5C - acetaminophen 650mg  q4-6 hours PRN. -Relative euglycemia (~ <180) and treat if hyperglycemia (>200  mg/dL)/hypoglycemia (< 60mg /dL).  -Precautions: Aspiration/seizure/fall. -Neurosurgery following, appreciate input ~ see recommendation for conservative management  #Hypertension #Hypertensive Urgency Hx of Diastolic CHF, NSTEMI, CVA on DUAP, HLD Home meds: Amlodipine, hydralazine, metoprolol, valsartan -Weaned off Cleviprex -Hold antiplatelets -Continue simvastatin -Long-term BP goal normotensive -Continue home Amlodipine, hydralazine and metoprolol   #CKD Stage III -Monitor I&O's / urinary output -Follow BMP -Ensure adequate renal perfusion -Avoid nephrotoxic agents as able -Replace electrolytes as indicated   #T2Diabetes mellitus -CBGs -Sliding scale insulin -Follow ICU hyper/hypoglycemia protocol  #Anxiety and Depression -Continue home citalopram and Cymbalta   Best practice:  Diet:  Oral Pain/Anxiety/Delirium protocol (if indicated): No VAP protocol (if indicated): Not indicated DVT prophylaxis: Contraindicated (SCD's only) GI prophylaxis: N/A Glucose control:  SSI Yes Central venous access:  N/A Arterial line:  N/A Foley:  N/A Mobility:  bed rest  PT consulted: yes Last date of multidisciplinary goals of care discussion [12/28] Code Status:  DNR Disposition: Med-Surg  12/28: Pt and her niece's updated at bedside on plan of care.  Care time: 40 minutes     Harlon Ditty, AGACNP-BC [facility] Prefer epic messenger for cross cover needs If after hours, please call E-link

## 2023-05-08 NOTE — Plan of Care (Signed)
  Problem: Education: Goal: Knowledge of disease or condition will improve Outcome: Progressing Goal: Knowledge of secondary prevention will improve (MUST DOCUMENT ALL) Outcome: Progressing Goal: Knowledge of patient specific risk factors will improve Loraine Leriche N/A or DELETE if not current risk factor) Outcome: Progressing   Problem: Intracerebral Hemorrhage Tissue Perfusion: Goal: Complications of Intracerebral Hemorrhage will be minimized Outcome: Progressing   Problem: Coping: Goal: Will verbalize positive feelings about self Outcome: Progressing Goal: Will identify appropriate support needs Outcome: Progressing   Problem: Health Behavior/Discharge Planning: Goal: Ability to manage health-related needs will improve Outcome: Progressing Goal: Goals will be collaboratively established with patient/family Outcome: Progressing   Problem: Self-Care: Goal: Ability to participate in self-care as condition permits will improve Outcome: Progressing Goal: Verbalization of feelings and concerns over difficulty with self-care will improve Outcome: Progressing Goal: Ability to communicate needs accurately will improve Outcome: Progressing   Problem: Nutrition: Goal: Risk of aspiration will decrease Outcome: Progressing Goal: Dietary intake will improve Outcome: Progressing   Problem: Education: Goal: Knowledge of General Education information will improve Description: Including pain rating scale, medication(s)/side effects and non-pharmacologic comfort measures Outcome: Progressing   Problem: Health Behavior/Discharge Planning: Goal: Ability to manage health-related needs will improve Outcome: Progressing   Problem: Clinical Measurements: Goal: Ability to maintain clinical measurements within normal limits will improve Outcome: Progressing Goal: Will remain free from infection Outcome: Progressing Goal: Diagnostic test results will improve Outcome: Progressing Goal:  Respiratory complications will improve Outcome: Progressing Goal: Cardiovascular complication will be avoided Outcome: Progressing   Problem: Activity: Goal: Risk for activity intolerance will decrease Outcome: Progressing   Problem: Nutrition: Goal: Adequate nutrition will be maintained Outcome: Progressing   Problem: Coping: Goal: Level of anxiety will decrease Outcome: Progressing   Problem: Elimination: Goal: Will not experience complications related to bowel motility Outcome: Progressing Goal: Will not experience complications related to urinary retention Outcome: Progressing   Problem: Pain Management: Goal: General experience of comfort will improve Outcome: Progressing   Problem: Safety: Goal: Ability to remain free from injury will improve Outcome: Progressing   Problem: Skin Integrity: Goal: Risk for impaired skin integrity will decrease Outcome: Progressing

## 2023-05-08 NOTE — Progress Notes (Signed)
Daily Progress Note   Patient Name: Tina Curtis       Date: 05/08/2023 [name]: [birth year]  Age: 89 y.o. MRN#: [id number] Attending Physician: Janann Colonel, MD Primary Care Physician: [name], MD Admit Date: 05/06/2023  Reason for Consultation/Follow-up: Establishing goals of care  HPI/Brief Hospital Review:  89 y.o. female  with past medical history of HTN, NSTEMI, diastolic CHF, breast cancer followed by oncology remains on Anastrazole, T2DM, chronic headache, CVA on dual therapy, neuropathic pain, HLD, CKD, anxiety and depression, recent traumatic subarachnoid hemorrhage and recurrent falls   admitted from [facility] independent living on 05/06/2023 with traumatic fall.   Initial CT head revealed IMPRESSION: CT head: 1. Mixed density subdural hematoma overlying the right cerebral hemisphere, measuring 2 cm in thickness. A significant portion of the hematoma is comprised of acute components. Mass effect upon the underlying right cerebral hemisphere with 5 mm leftward midline shift. 2. 3 mm hyperdense focus along the septum pellucidum, which could potentially reflect a small amount of hemorrhage within the septum pellucidum or within the left lateral ventricle. Attention recommended on imaging follow-up.   Neurosurgery consulted-repeat scan stable, patient and family have opted out of pursuing full craniotomy surgery, discussion still had around pursuing burr holes if Ms. Allenbaugh declines, recommends to monitor closely, safe to work with PT/OT   Noted recent admit 11/18-11/22 for fall and found to have trace subarachnoid hemorrhage-neurosurgery recommended conservative management at that time and holding dual antiplatelet therapy x 1 week   Palliative medicine was  consulted for assisting with goals of care conversations.   Subjective: Extensive chart review has been completed prior to meeting patient including labs, vital signs, imaging, progress notes, orders, and available advanced directive documents from current and previous encounters.    Visited with Tina Curtis at her bedside, she is sitting in recliner finishing her breakfast. She has no acute complaints, she is looking forward to returning to her home. No family at bedside during time of visit. Will attempt to reach out to daughters tomorrow, Tina Curtis is scheduled to visit on Monday.  Care plan was discussed with nursing staff and ICU team.  Thank you for allowing the Palliative Medicine Team to assist in the care of this patient.  Total time:  25 minutes  Time spent includes: Detailed review of medical records (  labs, imaging, vital signs), medically appropriate exam (mental status, respiratory, cardiac, skin), discussed with treatment team, counseling and educating patient, family and staff, documenting clinical information, medication management and coordination of care.  Leeanne Deed, DNP, AGNP-C Palliative Medicine   Please contact Palliative Medicine Team phone at [phone] for questions and concerns.

## 2023-05-09 DIAGNOSIS — S062XAA Diffuse traumatic brain injury with loss of consciousness status unknown, initial encounter: Secondary | ICD-10-CM | POA: Diagnosis not present

## 2023-05-09 DIAGNOSIS — Z515 Encounter for palliative care: Secondary | ICD-10-CM | POA: Diagnosis not present

## 2023-05-09 DIAGNOSIS — S065XAA Traumatic subdural hemorrhage with loss of consciousness status unknown, initial encounter: Secondary | ICD-10-CM | POA: Diagnosis not present

## 2023-05-09 DIAGNOSIS — S06A0XA Traumatic brain compression without herniation, initial encounter: Secondary | ICD-10-CM | POA: Diagnosis not present

## 2023-05-09 LAB — RENAL FUNCTION PANEL
Albumin: 3.5 g/dL (ref 3.5–5.0)
Anion gap: 11 (ref 5–15)
BUN: 34 mg/dL — ABNORMAL HIGH (ref 8–23)
CO2: 18 mmol/L — ABNORMAL LOW (ref 22–32)
Calcium: 8.8 mg/dL — ABNORMAL LOW (ref 8.9–10.3)
Chloride: 106 mmol/L (ref 98–111)
Creatinine, Ser: 1.47 mg/dL — ABNORMAL HIGH (ref 0.44–1.00)
GFR, Estimated: 33 mL/min — ABNORMAL LOW (ref >60)
Glucose, Bld: 119 mg/dL — ABNORMAL HIGH (ref 70–99)
Phosphorus: 3.4 mg/dL (ref 2.5–4.6)
Potassium: 3.8 mmol/L (ref 3.5–5.1)
Sodium: 135 mmol/L (ref 135–145)

## 2023-05-09 LAB — MAGNESIUM: Magnesium: 2 mg/dL (ref 1.7–2.4)

## 2023-05-09 LAB — CBC
HCT: 31 % — ABNORMAL LOW (ref 36.0–46.0)
Hemoglobin: 10.9 g/dL — ABNORMAL LOW (ref 12.0–15.0)
MCH: 32.4 pg (ref 26.0–34.0)
MCHC: 35.2 g/dL (ref 30.0–36.0)
MCV: 92.3 fL (ref 80.0–100.0)
Platelets: 196 10*3/uL (ref 150–400)
RBC: 3.36 MIL/uL — ABNORMAL LOW (ref 3.87–5.11)
RDW: 13.4 % (ref 11.5–15.5)
WBC: 7 10*3/uL (ref 4.0–10.5)
nRBC: 0 % (ref 0.0–0.2)

## 2023-05-09 MED ORDER — HYDRALAZINE HCL 50 MG PO TABS
100.0000 mg | ORAL_TABLET | Freq: Three times a day (TID) | ORAL | Status: DC
Start: 2023-05-09 — End: 2023-05-11
  Administered 2023-05-09 – 2023-05-11 (×6): 100 mg via ORAL
  Filled 2023-05-09 (×7): qty 2

## 2023-05-09 MED ORDER — POLYETHYLENE GLYCOL 3350 17 G PO PACK
17.0000 g | PACK | Freq: Two times a day (BID) | ORAL | Status: DC
  Administered 2023-05-09 – 2023-05-11 (×4): 17 g via ORAL
  Filled 2023-05-09 (×5): qty 1

## 2023-05-09 NOTE — Progress Notes (Signed)
Physical Therapy Treatment Patient Details Name: Tina Curtis MRN: [id number] [name]: January 06, 1927 Today's Date: 05/09/2023   History of Present Illness Pt is a 89 y.o female with significant PMH of HTN, NSTEMI, chronic diastolic CHF, metastatic bone cancer, T2DM chronic headache, CVA on dual antiplatelet therapy, neuropathic pain, HLD, CKD, anxiety and depression, recent traumatic subarachnoid hemorrhage, recurrent falls who presented to the ED with chief complaints of traumatic fall. 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    PT Comments  Pt resting in bed upon PT arrival; pt agreeable to therapy.  During session pt SBA semi-supine to sitting EOB (increased effort/time to perform on own); mod assist to stand from bed; and min to mod assist to ambulate a few feet bed to recliner with RW use.  Pt demonstrating posterior lean in standing activities requiring cueing and assist for upright posture.  Will continue to focus on strengthening, balance, and progressive functional mobility during hospitalization.    If plan is discharge home, recommend the following: A lot of help with walking and/or transfers;A lot of help with bathing/dressing/bathroom;Assist for transportation;Assistance with cooking/housework;Help with stairs or ramp for entrance   Can travel by private vehicle     No  Equipment Recommendations  Other (comment) (TBD at next facility)    Recommendations for Other Services       Precautions / Restrictions Precautions Precautions: Fall Restrictions Weight Bearing Restrictions Per Provider Order: No Other Position/Activity Restrictions: L AFO/shoes with mobility     Mobility  Bed Mobility Overal bed mobility: Needs Assistance Bed Mobility: Supine to Sit     Supine to sit: Supervision, HOB elevated     General bed mobility comments: increased effort to perform on own; vc's for technique to scoot forward towards EOB     Transfers Overall transfer level: Needs assistance Equipment used: Rolling walker (2 wheels) Transfers: Sit to/from Stand Sit to Stand: Mod assist           General transfer comment: assist to initiate and come to full stand; assist to control descent sitting; vc's for UE/LE positioning and overall technique; assist in standing d/t posterior lean (vc's to shift weight forward)    Ambulation/Gait Ambulation/Gait assistance: Min assist, Mod assist Gait Distance (Feet): 3 Feet (bed to recliner) Assistive device: Rolling walker (2 wheels) Gait Pattern/deviations: Decreased step length - right, Decreased step length - left Gait velocity: decreased     General Gait Details: assist to steady d/t posterior lean; assist for walker management; vc's for technique   Stairs             Wheelchair Mobility     Tilt Bed    Modified Rankin (Stroke Patients Only)       Balance Overall balance assessment: Needs assistance Sitting-balance support: No upper extremity supported, Feet supported Sitting balance-Leahy Scale: Good Sitting balance - Comments: steady reaching within BOS   Standing balance support: Bilateral upper extremity supported, Reliant on assistive device for balance, During functional activity Standing balance-Leahy Scale: Poor Standing balance comment: posterior lean requiring mod assist for standing balance and cueing to shift weight forward                            Cognition Arousal: Alert Behavior During Therapy: WFL for tasks assessed/performed Overall Cognitive Status: Within Functional Limits for tasks assessed  Exercises      General Comments  Nursing cleared pt for participation in physical therapy.  Pt agreeable to PT session.  Close family friend present.      Pertinent Vitals/Pain Pain Assessment Pain Assessment: 0-10 Pain Score: 5  Pain Location: L leg; also pain  B hips, B elbows, B knees L>R, L ankle, B toes) Pain Descriptors / Indicators: Aching, Tender, Sore Pain Intervention(s): Limited activity within patient's tolerance, Monitored during session, Repositioned (Pt reports recent pain meds) Vitals (HR and SpO2 on room air) stable and WNL throughout treatment session.  BP 168/76 with HR 77 bpm at rest end of session (nurse present and notified of elevated BP).    Home Living                          Prior Function            PT Goals (current goals can now be found in the care plan section) Acute Rehab PT Goals Patient Stated Goal: to go home PT Goal Formulation: With patient Time For Goal Achievement: 05/21/23 Potential to Achieve Goals: Fair Progress towards PT goals: Progressing toward goals    Frequency    Min 1X/week      PT Plan      Co-evaluation              AM-PAC PT "6 Clicks" Mobility   Outcome Measure  Help needed turning from your back to your side while in a flat bed without using bedrails?: A Little Help needed moving from lying on your back to sitting on the side of a flat bed without using bedrails?: A Little Help needed moving to and from a bed to a chair (including a wheelchair)?: A Lot Help needed standing up from a chair using your arms (e.g., wheelchair or bedside chair)?: A Lot Help needed to walk in hospital room?: A Lot Help needed climbing 3-5 steps with a railing? : Total 6 Click Score: 13    End of Session Equipment Utilized During Treatment: Gait belt Activity Tolerance: Patient tolerated treatment well Patient left: in chair;with call bell/phone within reach;with chair alarm set;with nursing/sitter in room;with family/visitor present Nurse Communication: Mobility status;Precautions PT Visit Diagnosis: Other abnormalities of gait and mobility (R26.89);Difficulty in walking, not elsewhere classified (R26.2);Muscle weakness (generalized) (M62.81)     Time: 7846-9629 PT Time  Calculation (min) (ACUTE ONLY): 29 min  Charges:    $Therapeutic Activity: 23-37 mins PT General Charges $$ ACUTE PT VISIT: 1 Visit                     Hendricks Limes, PT 05/09/23, 2:49 PM

## 2023-05-09 NOTE — Progress Notes (Signed)
Daily Progress Note   Patient Name: Tina Curtis       Date: 05/09/2023 [name]: November 30, 1926  Age: 89 y.o. MRN#: [id number] Attending Physician: Leeroy Bock, MD Primary Care Physician: [name], MD Admit Date: 05/06/2023  Reason for Consultation/Follow-up: Establishing goals of care  HPI/Brief Hospital Review: 89 y.o. female  with past medical history of HTN, NSTEMI, diastolic CHF, breast cancer followed by oncology remains on Anastrazole, T2DM, chronic headache, CVA on dual therapy, neuropathic pain, HLD, CKD, anxiety and depression, recent traumatic subarachnoid hemorrhage and recurrent falls   admitted from [facility] independent living on 05/06/2023 with traumatic fall.   Initial CT head revealed IMPRESSION: CT head: 1. Mixed density subdural hematoma overlying the right cerebral hemisphere, measuring 2 cm in thickness. A significant portion of the hematoma is comprised of acute components. Mass effect upon the underlying right cerebral hemisphere with 5 mm leftward midline shift. 2. 3 mm hyperdense focus along the septum pellucidum, which could potentially reflect a small amount of hemorrhage within the septum pellucidum or within the left lateral ventricle. Attention recommended on imaging follow-up.   Neurosurgery consulted-repeat scan stable, patient and family have opted out of pursuing full craniotomy surgery, discussion still had around pursuing burr holes if Ms. Lepisto declines, recommends to monitor closely, safe to work with PT/OT   Noted recent admit 11/18-11/22 for fall and found to have trace subarachnoid hemorrhage-neurosurgery recommended conservative management at that time and holding dual antiplatelet therapy x 1 week   Palliative medicine was consulted  for assisting with goals of care conversations  Subjective: Extensive chart review has been completed prior to meeting patient including labs, vital signs, imaging, progress notes, orders, and available advanced directive documents from current and previous encounters.    Visited with Tina Curtis at her bedside. She is resting in bed with eyes closed, her niece is at bedside. Tina Curtis awakes with calling of her name. She is oriented and able to engage in conversation. Physical therapy joins, niece has Tina Curtis demonstrate exercises they have been working on. Apparent significant weakness to left upper and lower extremity (prior CVA).  Niece shares families concerns related to possible UTI related to Tina Curtis's confusion. Called and spoke with daughter-Tina Curtis who also voiced her concerns related to UTI. We discussed appropriateness of testing for UTI  based on urinary symptoms and possible other causes contributing to confusion. Primary team made aware of families concerns. Tina Curtis arriving in town later tomorrow afternoon, will be visiting bedside throughout the week. We discussed based on therapy assessment, recommendations may be for Ms. Cousin to go to STR prior to returning home independently.  Answered and addressed all questions and concerns. PMT to continue to follow for ongoing needs and support  Care plan was discussed with primary team and nursing staff.  Thank you for allowing the Palliative Medicine Team to assist in the care of this patient.  Total time:  35 minutes  Time spent includes: Detailed review of medical records (labs, imaging, vital signs), medically appropriate exam (mental status, respiratory, cardiac, skin), discussed with treatment team, counseling and educating patient, family and staff, documenting clinical information, medication management and coordination of care.  Leeanne Deed, DNP, AGNP-C Palliative Medicine   Please contact Palliative Medicine Team phone  at [phone] for questions and concerns.

## 2023-05-09 NOTE — Plan of Care (Signed)
  Problem: Education: Goal: Knowledge of disease or condition will improve Outcome: Progressing Goal: Knowledge of secondary prevention will improve (MUST DOCUMENT ALL) Outcome: Progressing Goal: Knowledge of patient specific risk factors will improve Loraine Leriche N/A or DELETE if not current risk factor) Outcome: Progressing   Problem: Intracerebral Hemorrhage Tissue Perfusion: Goal: Complications of Intracerebral Hemorrhage will be minimized Outcome: Progressing   Problem: Coping: Goal: Will verbalize positive feelings about self Outcome: Progressing Goal: Will identify appropriate support needs Outcome: Progressing   Problem: Health Behavior/Discharge Planning: Goal: Ability to manage health-related needs will improve Outcome: Progressing Goal: Goals will be collaboratively established with patient/family Outcome: Progressing   Problem: Self-Care: Goal: Ability to participate in self-care as condition permits will improve Outcome: Progressing Goal: Verbalization of feelings and concerns over difficulty with self-care will improve Outcome: Progressing Goal: Ability to communicate needs accurately will improve Outcome: Progressing   Problem: Nutrition: Goal: Risk of aspiration will decrease Outcome: Progressing Goal: Dietary intake will improve Outcome: Progressing   Problem: Education: Goal: Knowledge of General Education information will improve Description: Including pain rating scale, medication(s)/side effects and non-pharmacologic comfort measures Outcome: Progressing   Problem: Health Behavior/Discharge Planning: Goal: Ability to manage health-related needs will improve Outcome: Progressing   Problem: Clinical Measurements: Goal: Ability to maintain clinical measurements within normal limits will improve Outcome: Progressing Goal: Will remain free from infection Outcome: Progressing Goal: Diagnostic test results will improve Outcome: Progressing Goal:  Respiratory complications will improve Outcome: Progressing Goal: Cardiovascular complication will be avoided Outcome: Progressing   Problem: Activity: Goal: Risk for activity intolerance will decrease Outcome: Progressing   Problem: Nutrition: Goal: Adequate nutrition will be maintained Outcome: Progressing   Problem: Coping: Goal: Level of anxiety will decrease Outcome: Progressing   Problem: Elimination: Goal: Will not experience complications related to bowel motility Outcome: Progressing Goal: Will not experience complications related to urinary retention Outcome: Progressing   Problem: Pain Management: Goal: General experience of comfort will improve Outcome: Progressing   Problem: Safety: Goal: Ability to remain free from injury will improve Outcome: Progressing   Problem: Skin Integrity: Goal: Risk for impaired skin integrity will decrease Outcome: Progressing

## 2023-05-09 NOTE — Progress Notes (Signed)
PHARMACY CONSULT NOTE - FOLLOW UP  Pharmacy Consult for Electrolyte Monitoring and Replacement   Recent Labs: Potassium (mmol/L)  Date Value  05/09/2023 3.8   Magnesium (mg/dL)  Date Value  16/02/9603 2.0   Calcium (mg/dL)  Date Value  54/01/8118 8.8 (L)   Albumin (g/dL)  Date Value  14/78/2956 3.5   Phosphorus (mg/dL)  Date Value  21/30/8657 3.4   Sodium  Date Value  05/09/2023 135 mmol/L  04/12/2023 140     Assessment: 89 y.o female with significant PMH of HTN, NSTEMI, chronic diastolic CHF, metastatic bone cancer, T2DM chronic headache, CVA on dual antiplatelet therapy, neuropathic pain, HLD, CKD, anxiety and depression, recent traumatic subarachnoid hemorrhage, recurrent falls who presented to the ED with chief complaints of traumatic fall. Pharmacy is asked to follow and replace electrolytes while in CCU  Goal of Therapy:  Electrolytes WNL  Plan:  ---no replacement at this time ---patient has transferred from ICU- pharmacy will sign off on CCM pharmacy monitoring for electrolytes  Angelique Blonder ,PharmD Clinical Pharmacist 05/09/2023 8:39 AM

## 2023-05-09 NOTE — TOC Progression Note (Signed)
Transition of Care [facility]) - Progression Note    Patient Details  Name: Tina Curtis MRN: [id number] Date of Birth: [birth year]  Transition of Care [facility] CM/SW Contact  Rodney Langton, RN Phone Number: 05/09/2023, 10:31 AM  Clinical Narrative:     Confirmed with MD that patient is medically ready for discharge, TOC team will start auth process.  Expected Discharge Plan: Skilled Nursing Facility Barriers to Discharge: Continued Medical Work up  Expected Discharge Plan and Services       Living arrangements for the past 2 months: Independent Living Facility                                       Social Determinants of Health (SDOH) Interventions SDOH Screenings   Food Insecurity: No Food Insecurity (05/07/2023)  Housing: Low Risk  (05/07/2023)  Transportation Needs: No Transportation Needs (05/07/2023)  Utilities: Not At Risk (05/07/2023)  Financial Resource Strain: Low Risk  (11/17/2022)   [facility] from [facility]  Tobacco Use: Low Risk  (05/06/2023)    Readmission Risk Interventions    05/07/2023   12:51 PM 06/08/2022    3:30 PM  Readmission Risk Prevention Plan  Transportation Screening Complete Complete  PCP or Specialist Appt within 3-5 Days Complete   HRI or Home Care Consult Complete   Social Work Consult for Recovery Care Planning/Counseling Complete   Palliative Care Screening Complete Not Applicable  Medication Review Oceanographer) Complete Complete

## 2023-05-09 NOTE — Progress Notes (Signed)
PROGRESS NOTE  Tina Curtis    [name]: [birth year], 89 y.o.  HQI:[id number]    Code Status: Limited: Do not attempt resuscitation (DNR) -DNR-LIMITED -Do Not Intubate/DNI    [name]: 05/06/2023   LOS: 3   Brief hospital course  Tina Curtis is a 89 year old female patient with a past medical history of hypertension, NSTEMI, chronic diastolic dysfunction, metastatic bone cancer, type 2 diabetes mellitus previous CVA on dual antiplatelet therapy presented on 12/26 to [facility] for fall complicated by subarachnoid hemorrhage.   On arrival to the ED she was awake alert hemodynamically stable without any neurodeficit.   CT head: mixed density subdural hematoma overlying the right cerebral hemisphere measuring 2 cm in thickness.  Notable mass effect with 5 mm leftward midline shift.  Also a new 3 mm hyperdense focus along the septum pellucid him possibly reflecting a small hemorrhage. She was evaluated by neurosurgery and family declined bur hole procedure at that time.   Repeat CT head showed stable subdural hematoma and subarachnoid hemorrhage. Her antiplatelets were discontinued.  PT/OT evaluated and recommended Snf at dc.   Assessment & Plan  Principal Problem:   Subdural hematoma (HCC)  Acute on Chronic traumatic Subdural Hematoma- stable on repeat imaging.  -Goal SBP < 160  -Continue home Norvasc, Hydralazine, and Metoprolol -Hold antiplatelets and anticoagulation for now. -Repeat CT head if any change in neuro exam or worsening headache - PT/OT  #Hypertension #Hypertensive Urgency Hx of Diastolic CHF, NSTEMI, CVA on DUAP, HLD -Continue home Amlodipine, hydralazine and metoprolol    #CKD Stage III -Monitor I&O's / urinary output -Follow BMP -Ensure adequate renal perfusion -Avoid nephrotoxic agents as able -Replace electrolytes as indicated   #T2Diabetes mellitus -CBGs -Sliding scale insulin -Follow ICU hyper/hypoglycemia protocol   #Anxiety and Depression -Continue home citalopram and  Cymbalta  Body mass index is 25.28 kg/m.  VTE ppx: SCD's Start: 05/06/23 1357  Diet:     Diet   Diet regular Room service appropriate? Yes; Fluid consistency: Thin   Consultants: Neurosurgery CCM  Subjective 05/09/23    Pt reports some general memory worsening and weakness. Nothing focal. She feels overall well.    Objective   Vitals:   05/08/23 1525 05/08/23 1548 05/08/23 2051 05/09/23 0532  BP: (!) 157/84 135/67 (!) 146/74 131/72  Pulse:  74 97 69  Resp:  16 17 20   Temp:  97.7 F (36.5 C) 98.2 F (36.8 C) 98.1 F (36.7 C)  TempSrc:      SpO2:  98% 97% 96%  Weight:      Height:        Intake/Output Summary (Last 24 hours) at 05/09/2023 0721 Last data filed at 05/09/2023 0631 Gross per 24 hour  Intake 100.15 ml  Output 300 ml  Net -199.85 ml   Filed Weights   05/06/23 0950 05/07/23 1201  Weight: 63 kg 62.7 kg     Physical Exam:  General: awake, alert, NAD HEENT: atraumatic, clear conjunctiva, anicteric sclera, MMM, hearing grossly normal Respiratory: normal respiratory effort. Cardiovascular: quick capillary refill, normal S1/S2, RRR, no JVD, murmurs Nervous: A&O x3. no gross focal neurologic deficits, normal speech Extremities: moves all equally, no edema, normal tone Skin: dry, intact, normal temperature, normal color. No rashes, lesions or ulcers on exposed skin Psychiatry: normal mood, congruent affect  Labs   I have personally reviewed the following labs and imaging studies CBC    Component Value Date/Time   WBC 7.0 05/09/2023 0436   RBC 3.36 (L) 05/09/2023 1324  HGB 10.9 (L) 05/09/2023 0436   HGB 11.0 (L) 02/22/2023 1444   HCT 31.0 (L) 05/09/2023 0436   PLT 196 05/09/2023 0436   PLT 233 02/22/2023 1444   MCV 92.3 05/09/2023 0436   MCH 32.4 05/09/2023 0436   MCHC 35.2 05/09/2023 0436   RDW 13.4 05/09/2023 0436   LYMPHSABS 2.1 05/06/2023 1051   MONOABS 0.7 05/06/2023 1051   EOSABS 0.1 05/06/2023 1051   BASOSABS 0.1 05/06/2023 1051       Latest Ref Rng & Units 05/09/2023    4:36 AM 05/08/2023    4:02 AM 05/07/2023    6:14 AM  BMP  Glucose 70 - 99 mg/dL 161  096  045   BUN 8 - 23 mg/dL 34  31  30   Creatinine 0.44 - 1.00 mg/dL 4.09  8.11  9.14   Sodium 135 - 145 mmol/L 135  136  140   Potassium 3.5 - 5.1 mmol/L 3.8  3.7  4.3   Chloride 98 - 111 mmol/L 106  108  113   CO2 22 - 32 mmol/L 18  19  18    Calcium 8.9 - 10.3 mg/dL 8.8  8.9  9.2     ECHOCARDIOGRAM COMPLETE Result Date: 05/07/2023    ECHOCARDIOGRAM REPORT   Patient Name:   Tina Curtis Date of Exam: 05/07/2023 Medical Rec #:  [id number]     Height:       62.0 in Accession #:    [id number]    Weight:       138.2 lb Date of Birth:  1927/05/11    BSA:          1.634 m Patient Age:    89 years      BP:           142/63 mmHg Patient Gender: F             HR:           94 bpm. Exam Location:  [facility] Procedure: 2D Echo, Cardiac Doppler and Color Doppler Indications:     Stroke  History:         Patient has prior history of Echocardiogram examinations, most                  recent 08/27/2022. CHF, Previous Myocardial Infarction, Stroke;                  Signs/Symptoms:Chest Pain, Dizziness/Lightheadedness and                  Fatigue. CKD, breast CA.  Sonographer:     Mikki Harbor Referring Phys:  [id number] Ezequiel Essex Diagnosing Phys: Julien Nordmann MD  Sonographer Comments: Technically difficult study due to poor echo windows and suboptimal parasternal window. IMPRESSIONS  1. Left ventricular ejection fraction, by estimation, is 60 to 65%. The left ventricle has normal function. The left ventricle has no regional wall motion abnormalities. Left ventricular diastolic parameters are consistent with Grade I diastolic dysfunction (impaired relaxation).  2. Right ventricular systolic function is normal. The right ventricular size is normal. There is mildly elevated pulmonary artery systolic pressure. The estimated right ventricular systolic pressure is 41.9 mmHg.  3. The mitral  valve is normal in structure. No evidence of mitral valve regurgitation. No evidence of mitral stenosis.  4. The aortic valve is normal in structure. There is mild calcification of the aortic valve. Aortic valve regurgitation is mild. Aortic valve sclerosis/calcification is present, without any  evidence of aortic stenosis. Aortic valve mean gradient measures 8.0 mmHg.  5. The inferior vena cava is normal in size with greater than 50% respiratory variability, suggesting right atrial pressure of 3 mmHg. FINDINGS  Left Ventricle: Left ventricular ejection fraction, by estimation, is 60 to 65%. The left ventricle has normal function. The left ventricle has no regional wall motion abnormalities. The left ventricular internal cavity size was normal in size. There is  no left ventricular hypertrophy. Left ventricular diastolic parameters are consistent with Grade I diastolic dysfunction (impaired relaxation). Right Ventricle: The right ventricular size is normal. No increase in right ventricular wall thickness. Right ventricular systolic function is normal. There is mildly elevated pulmonary artery systolic pressure. The tricuspid regurgitant velocity is 2.91  m/s, and with an assumed right atrial pressure of 8 mmHg, the estimated right ventricular systolic pressure is 41.9 mmHg. Left Atrium: Left atrial size was normal in size. Right Atrium: Right atrial size was normal in size. Pericardium: There is no evidence of pericardial effusion. Mitral Valve: The mitral valve is normal in structure. There is mild calcification of the mitral valve leaflet(s). No evidence of mitral valve regurgitation. No evidence of mitral valve stenosis. MV peak gradient, 5.8 mmHg. The mean mitral valve gradient  is 3.0 mmHg. Tricuspid Valve: The tricuspid valve is normal in structure. Tricuspid valve regurgitation is mild . No evidence of tricuspid stenosis. Aortic Valve: The aortic valve is normal in structure. There is mild calcification of the  aortic valve. Aortic valve regurgitation is mild. Aortic valve sclerosis/calcification is present, without any evidence of aortic stenosis. Aortic valve mean gradient measures 8.0 mmHg. Aortic valve peak gradient measures 17.1 mmHg. Aortic valve area, by VTI measures 2.43 cm. Pulmonic Valve: The pulmonic valve was normal in structure. Pulmonic valve regurgitation is not visualized. No evidence of pulmonic stenosis. Aorta: The aortic root is normal in size and structure. Venous: The inferior vena cava is normal in size with greater than 50% respiratory variability, suggesting right atrial pressure of 3 mmHg. IAS/Shunts: No atrial level shunt detected by color flow Doppler.  LEFT VENTRICLE PLAX 2D LVIDd:         3.00 cm   Diastology LVIDs:         1.90 cm   LV e' medial:    5.55 cm/s LV PW:         0.80 cm   LV E/e' medial:  17.3 LV IVS:        1.40 cm   LV e' lateral:   5.98 cm/s LVOT diam:     1.90 cm   LV E/e' lateral: 16.0 LV SV:         87 LV SV Index:   53 LVOT Area:     2.84 cm  RIGHT VENTRICLE RV Basal diam:  3.00 cm RV Mid diam:    2.20 cm RV S prime:     20.40 cm/s TAPSE (M-mode): 2.6 cm LEFT ATRIUM             Index        RIGHT ATRIUM           Index LA diam:        2.90 cm 1.77 cm/m   RA Area:     11.90 cm LA Vol (A2C):   51.4 ml 31.46 ml/m  RA Volume:   23.70 ml  14.50 ml/m LA Vol (A4C):   36.5 ml 22.34 ml/m LA Biplane Vol: 45.8 ml 28.03 ml/m  AORTIC  VALVE AV Area (Vmax):    2.08 cm AV Area (Vmean):   2.03 cm AV Area (VTI):     2.43 cm AV Vmax:           207.00 cm/s AV Vmean:          132.000 cm/s AV VTI:            0.357 m AV Peak Grad:      17.1 mmHg AV Mean Grad:      8.0 mmHg LVOT Vmax:         152.00 cm/s LVOT Vmean:        94.700 cm/s LVOT VTI:          0.306 m LVOT/AV VTI ratio: 0.86  AORTA Ao Root diam: 3.10 cm MITRAL VALVE                TRICUSPID VALVE MV Area (PHT): 3.23 cm     TR Peak grad:   33.9 mmHg MV Area VTI:   3.34 cm     TR Vmax:        291.00 cm/s MV Peak grad:  5.8 mmHg  MV Mean grad:  3.0 mmHg     SHUNTS MV Vmax:       1.20 m/s     Systemic VTI:  0.31 m MV Vmean:      78.9 cm/s    Systemic Diam: 1.90 cm MV Decel Time: 235 msec MV E velocity: 95.90 cm/s MV A velocity: 134.00 cm/s MV E/A ratio:  0.72 Julien Nordmann MD Electronically signed by Julien Nordmann MD Signature Date/Time: 05/07/2023/3:01:11 PM    Final     Disposition Plan & Communication  Patient status: Inpatient  Admitted From: ALF Planned disposition location: Skilled nursing facility Anticipated discharge date: 12/30 pending SNF availability  Family Communication: several friends and daughter    Author: Leeroy Bock, DO [facility] 05/09/2023, 7:21 AM   Available by Epic secure chat 7AM-7PM. If 7PM-7AM, please contact night-coverage.  [facility] contact information found on [email or web address].

## 2023-05-10 DIAGNOSIS — S065XAA Traumatic subdural hemorrhage with loss of consciousness status unknown, initial encounter: Secondary | ICD-10-CM | POA: Diagnosis not present

## 2023-05-10 LAB — CBC
HCT: 30 % — ABNORMAL LOW (ref 36.0–46.0)
Hemoglobin: 10.4 g/dL — ABNORMAL LOW (ref 12.0–15.0)
MCH: 31.3 pg (ref 26.0–34.0)
MCHC: 34.7 g/dL (ref 30.0–36.0)
MCV: 90.4 fL (ref 80.0–100.0)
Platelets: 190 10*3/uL (ref 150–400)
RBC: 3.32 MIL/uL — ABNORMAL LOW (ref 3.87–5.11)
RDW: 13.2 % (ref 11.5–15.5)
WBC: 11.9 10*3/uL — ABNORMAL HIGH (ref 4.0–10.5)
nRBC: 0 % (ref 0.0–0.2)

## 2023-05-10 LAB — RENAL FUNCTION PANEL
Albumin: 3.5 g/dL (ref 3.5–5.0)
Anion gap: 9 (ref 5–15)
BUN: 39 mg/dL — ABNORMAL HIGH (ref 8–23)
CO2: 19 mmol/L — ABNORMAL LOW (ref 22–32)
Calcium: 8.6 mg/dL — ABNORMAL LOW (ref 8.9–10.3)
Chloride: 105 mmol/L (ref 98–111)
Creatinine, Ser: 1.56 mg/dL — ABNORMAL HIGH (ref 0.44–1.00)
GFR, Estimated: 31 mL/min — ABNORMAL LOW (ref >60)
Glucose, Bld: 137 mg/dL — ABNORMAL HIGH (ref 70–99)
Phosphorus: 2.9 mg/dL (ref 2.5–4.6)
Potassium: 4 mmol/L (ref 3.5–5.1)
Sodium: 133 mmol/L — ABNORMAL LOW (ref 135–145)

## 2023-05-10 MED ORDER — ANASTROZOLE 1 MG PO TABS
1.0000 mg | ORAL_TABLET | Freq: Every day | ORAL | Status: DC
  Administered 2023-05-10 – 2023-05-11 (×2): 1 mg via ORAL
  Filled 2023-05-10 (×2): qty 1

## 2023-05-10 MED ORDER — PANTOPRAZOLE SODIUM 40 MG PO TBEC
40.0000 mg | DELAYED_RELEASE_TABLET | Freq: Every day | ORAL | Status: DC
  Administered 2023-05-10: 40 mg via ORAL
  Filled 2023-05-10: qty 1

## 2023-05-10 NOTE — TOC Progression Note (Signed)
Transition of Care [facility]) - Progression Note    Patient Details  Name: Tina Curtis MRN: [id number] Date of Birth: 04/26/1927  Transition of [facility]) CM/SW Contact  Allena Katz, LCSW Phone Number: 05/10/2023, 10:06 AM  Clinical Narrative:   [facility] started for [facility].     Expected Discharge Plan: Skilled Nursing Facility Barriers to Discharge: Continued Medical Work up  Expected Discharge Plan and Services       Living arrangements for the past 2 months: Independent Living Facility                                       Social Determinants of Health (SDOH) Interventions SDOH Screenings   Food Insecurity: No Food Insecurity (05/07/2023)  Housing: Low Risk  (05/07/2023)  Transportation Needs: No Transportation Needs (05/07/2023)  Utilities: Not At Risk (05/07/2023)  Financial Resource Strain: Low Risk  (11/17/2022)   Received from [facility], [facility]  Tobacco Use: Low Risk  (05/06/2023)    Readmission Risk Interventions    05/07/2023   12:51 PM 06/08/2022    3:30 PM  Readmission Risk Prevention Plan  Transportation Screening Complete Complete  PCP or Specialist Appt within 3-5 Days Complete   HRI or Home Care Consult Complete   Social Work Consult for Recovery Care Planning/Counseling Complete   Palliative Care Screening Complete Not Applicable  Medication Review Oceanographer) Complete Complete

## 2023-05-10 NOTE — Care Management Important Message (Signed)
Important Message  Patient Details  Name: Tina Curtis MRN: [id number] Date of Birth: [birth year]   Important Message Given:  Yes - Medicare IM     Jarriel Papillion W, CMA 05/10/2023, 3:11 PM

## 2023-05-10 NOTE — Progress Notes (Signed)
Physical Therapy Treatment Patient Details Name: Tina Curtis MRN: [id number] [name]: 1927-03-07 Today's Date: 05/10/2023   History of Present Illness Pt is a 89 y.o female with significant PMH of HTN, NSTEMI, chronic diastolic CHF, metastatic bone cancer, T2DM chronic headache, CVA on dual antiplatelet therapy, neuropathic pain, HLD, CKD, anxiety and depression, recent traumatic subarachnoid hemorrhage, recurrent falls who presented to the ED with chief complaints of traumatic fall. 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    PT Comments  Pt resting in recliner upon PT arrival; visitor present; pt agreeable to therapy.  During session pt min assist with transfer from recliner and CGA to min assist to ambulate 40 feet with RW use.  Pt requiring assist intermittently for balance and walker navigation.  Will continue to focus on strengthening, balance, and progressive functional mobility during hospitalization.    If plan is discharge home, recommend the following: Assist for transportation;Assistance with cooking/housework;Help with stairs or ramp for entrance;A little help with walking and/or transfers;A little help with bathing/dressing/bathroom   Can travel by private vehicle     No  Equipment Recommendations  Other (comment) (TBD at next facility)    Recommendations for Other Services       Precautions / Restrictions Precautions Precautions: Fall Restrictions Weight Bearing Restrictions Per Provider Order: No Other Position/Activity Restrictions: L AFO/shoes with mobility     Mobility  Bed Mobility               General bed mobility comments: Deferred (pt in recliner beginning/end of session)    Transfers Overall transfer level: Needs assistance Equipment used: Rolling walker (2 wheels) Transfers: Sit to/from Stand Sit to Stand: Min assist           General transfer comment: assist to initiate and come to full  stand; assist to control descent sitting; vc's for UE/LE positioning and overall technique; initial assist in standing d/t mild posterior lean    Ambulation/Gait Ambulation/Gait assistance: Contact guard assist, Min assist Gait Distance (Feet): 40 Feet Assistive device: Rolling walker (2 wheels) Gait Pattern/deviations: Decreased step length - right, Decreased step length - left Gait velocity: decreased     General Gait Details: increased effort to advance L LE and L LE impaired coordination noted (especially with turns); intermittent assist to steady; initial assist for walker management and then with turns and with fatigue   Stairs             Wheelchair Mobility     Tilt Bed    Modified Rankin (Stroke Patients Only)       Balance Overall balance assessment: Needs assistance Sitting-balance support: No upper extremity supported, Feet supported Sitting balance-Leahy Scale: Good Sitting balance - Comments: steady reaching within BOS     Standing balance-Leahy Scale: Poor Standing balance comment: initial mild posterior lean in standing; intermittent assist to steady with ambulation                            Cognition Arousal: Alert Behavior During Therapy: WFL for tasks assessed/performed Overall Cognitive Status: Within Functional Limits for tasks assessed                                          Exercises      General Comments  Nursing cleared pt for  participation in physical therapy.  Pt agreeable to PT session.      Pertinent Vitals/Pain Pain Assessment Pain Assessment: 0-10 Pain Score: 4  Pain Location: L ankle Pain Descriptors / Indicators: Aching, Tender, Sore Pain Intervention(s): Limited activity within patient's tolerance, Monitored during session, Repositioned (RN notified) HR 89-95 bpm and SpO2 sats 96% or greater on room air during session.  BP 150/68 at rest end of session (nurse notified).    Home Living                           Prior Function            PT Goals (current goals can now be found in the care plan section) Acute Rehab PT Goals Patient Stated Goal: to go home PT Goal Formulation: With patient Time For Goal Achievement: 05/21/23 Potential to Achieve Goals: Fair Progress towards PT goals: Progressing toward goals    Frequency    Min 1X/week      PT Plan      Co-evaluation              AM-PAC PT "6 Clicks" Mobility   Outcome Measure  Help needed turning from your back to your side while in a flat bed without using bedrails?: A Little Help needed moving from lying on your back to sitting on the side of a flat bed without using bedrails?: A Little Help needed moving to and from a bed to a chair (including a wheelchair)?: A Little Help needed standing up from a chair using your arms (e.g., wheelchair or bedside chair)?: A Little Help needed to walk in hospital room?: A Little Help needed climbing 3-5 steps with a railing? : A Lot 6 Click Score: 17    End of Session Equipment Utilized During Treatment: Gait belt Activity Tolerance: Patient tolerated treatment well Patient left: in chair;with call bell/phone within reach;with chair alarm set;with family/visitor present Nurse Communication: Mobility status;Precautions;Other (comment) (pt pain status) PT Visit Diagnosis: Other abnormalities of gait and mobility (R26.89);Difficulty in walking, not elsewhere classified (R26.2);Muscle weakness (generalized) (M62.81)     Time: 5621-3086 PT Time Calculation (min) (ACUTE ONLY): 27 min  Charges:    $Gait Training: 8-22 mins $Therapeutic Activity: 8-22 mins PT General Charges $$ ACUTE PT VISIT: 1 Visit                     Hendricks Limes, PT 05/10/23, 12:35 PM

## 2023-05-10 NOTE — Progress Notes (Signed)
PHARMACIST - PHYSICIAN COMMUNICATION  DR:   Lucianne Muss  CONCERNING: IV to Oral Route Change Policy  RECOMMENDATION: This patient is receiving pantoprazole by the intravenous route.  Based on criteria approved by the Pharmacy and Therapeutics Committee, the intravenous medication(s) is/are being converted to the equivalent oral dose form(s).   DESCRIPTION: These criteria include: The patient is eating (either orally or via tube) and/or has been taking other orally administered medications for a least 24 hours The patient has no evidence of active gastrointestinal bleeding or impaired GI absorption (gastrectomy, short bowel, patient on TNA or NPO).  If you have questions about this conversion, please contact the Pharmacy Department   Aithana Kushner Rodriguez-Guzman PharmD, BCPS 05/10/2023 12:38 PM

## 2023-05-10 NOTE — Progress Notes (Addendum)
PROGRESS NOTE  Tina Curtis    [name]: [birth year], 89 y.o.  [name]:[id number]    Code Status: Limited: Do not attempt resuscitation (DNR) -DNR-LIMITED -Do Not Intubate/DNI    [name]: 05/06/2023   LOS: 4   Brief hospital course  Tina Curtis is a 89 year old female patient with a past medical history of hypertension, NSTEMI, chronic diastolic dysfunction, metastatic bone cancer, type 2 diabetes mellitus previous CVA on dual antiplatelet therapy presented on 12/26 to [facility] for fall complicated by subarachnoid hemorrhage.   On arrival to the ED she was awake alert hemodynamically stable without any neurodeficit.   CT head: mixed density subdural hematoma overlying the right cerebral hemisphere measuring 2 cm in thickness.  Notable mass effect with 5 mm leftward midline shift.  Also a new 3 mm hyperdense focus along the septum pellucid him possibly reflecting a small hemorrhage. She was evaluated by neurosurgery and family declined bur hole procedure at that time.   Repeat CT head showed stable subdural hematoma and subarachnoid hemorrhage. Her antiplatelets were discontinued.  PT/OT evaluated and recommended Snf at dc.   Assessment & Plan  Principal Problem:   Subdural hematoma (HCC)  Acute on Chronic traumatic Subdural Hematoma- stable on repeat imaging.  -Goal SBP < 160  -Continue home Norvasc, Hydralazine, and Metoprolol -Hold antiplatelets and anticoagulation for now. -Repeat CT head if any change in neuro exam or worsening headache Follow neurosurgery in 1 to 2 weeks, recommended to hold off dual antiplatelet therapy for now. PT/OT eval done, recommend SNF placement  #Hypertension #Hypertensive Urgency Hx of Diastolic CHF, NSTEMI, CVA on DUAP, HLD -Continue home Amlodipine, hydralazine and metoprolol    #CKD Stage III -Monitor I&O's / urinary output -Follow BMP -Ensure adequate renal perfusion -Avoid nephrotoxic agents as able -Replace electrolytes as indicated Encourage oral  hydration  #T2Diabetes mellitus -CBGs -Sliding scale insulin   #Anxiety and Depression -Continue home citalopram and Cymbalta  Body mass index is 25.28 kg/m.  VTE ppx: SCD's Start: 05/06/23 1357  Diet:     Diet   Diet regular Room service appropriate? Yes; Fluid consistency: Thin   Consultants: Neurosurgery CCM  Subjective 05/10/23    Pt reports some general memory worsening and weakness. Nothing focal. She feels overall well.    Objective   Vitals:   05/09/23 2020 05/10/23 0544 05/10/23 0816 05/10/23 1432  BP: 126/67 (!) 142/71 124/61 138/63  Pulse: 86 70 74   Resp: 18 18 17    Temp: 98.1 F (36.7 C) (!) 97.5 F (36.4 C) 98.2 F (36.8 C)   TempSrc: Oral     SpO2: 96% 98% 98%   Weight:      Height:       No intake or output data in the 24 hours ending 05/10/23 1525  Filed Weights   05/06/23 0950 05/07/23 1201  Weight: 63 kg 62.7 kg     Physical Exam:  General: awake, alert, NAD HEENT: atraumatic, clear conjunctiva, anicteric sclera, MMM, hearing grossly normal Respiratory: normal respiratory effort. Cardiovascular: quick capillary refill, normal S1/S2, RRR, no JVD, murmurs Nervous: A&O x3. no gross focal neurologic deficits, normal speech Extremities: moves all equally, no edema, normal tone Skin: dry, intact, normal temperature, normal color. No rashes, lesions or ulcers on exposed skin Psychiatry: normal mood, congruent affect  Labs   I have personally reviewed the following labs and imaging studies CBC    Component Value Date/Time   WBC 11.9 (H) 05/10/2023 0210   RBC 3.32 (L) 05/10/2023 0210  HGB 10.4 (L) 05/10/2023 0210   HGB 11.0 (L) 02/22/2023 1444   HCT 30.0 (L) 05/10/2023 0210   PLT 190 05/10/2023 0210   PLT 233 02/22/2023 1444   MCV 90.4 05/10/2023 0210   MCH 31.3 05/10/2023 0210   MCHC 34.7 05/10/2023 0210   RDW 13.2 05/10/2023 0210   LYMPHSABS 2.1 05/06/2023 1051   MONOABS 0.7 05/06/2023 1051   EOSABS 0.1 05/06/2023 1051    BASOSABS 0.1 05/06/2023 1051      Latest Ref Rng & Units 05/10/2023    2:10 AM 05/09/2023    4:36 AM 05/08/2023    4:02 AM  BMP  Glucose 70 - 99 mg/dL 811  914  782   BUN 8 - 23 mg/dL 39  34  31   Creatinine 0.44 - 1.00 mg/dL 9.56  2.13  0.86   Sodium 135 - 145 mmol/L 133  135  136   Potassium 3.5 - 5.1 mmol/L 4.0  3.8  3.7   Chloride 98 - 111 mmol/L 105  106  108   CO2 22 - 32 mmol/L 19  18  19    Calcium 8.9 - 10.3 mg/dL 8.6  8.8  8.9     No results found.   Disposition Plan & Communication  Patient status: Inpatient  Admitted From: ALF Planned disposition location: Skilled nursing facility Anticipated discharge date:  pending SNF bed availability  Family Communication: several friends and daughter    Author: Gillis Santa, MD [facility] 05/10/2023, 3:25 PM   Available by Epic secure chat 7AM-7PM. If 7PM-7AM, please contact night-coverage.  [facility] contact information found on [email or web address].

## 2023-05-11 DIAGNOSIS — S065XAA Traumatic subdural hemorrhage with loss of consciousness status unknown, initial encounter: Secondary | ICD-10-CM | POA: Diagnosis not present

## 2023-05-11 LAB — CBC
HCT: 30.8 % — ABNORMAL LOW (ref 36.0–46.0)
Hemoglobin: 10.6 g/dL — ABNORMAL LOW (ref 12.0–15.0)
MCH: 31.6 pg (ref 26.0–34.0)
MCHC: 34.4 g/dL (ref 30.0–36.0)
MCV: 91.9 fL (ref 80.0–100.0)
Platelets: 199 10*3/uL (ref 150–400)
RBC: 3.35 MIL/uL — ABNORMAL LOW (ref 3.87–5.11)
RDW: 13.1 % (ref 11.5–15.5)
WBC: 7.5 10*3/uL (ref 4.0–10.5)
nRBC: 0 % (ref 0.0–0.2)

## 2023-05-11 LAB — BASIC METABOLIC PANEL
Anion gap: 8 (ref 5–15)
BUN: 45 mg/dL — ABNORMAL HIGH (ref 8–23)
CO2: 17 mmol/L — ABNORMAL LOW (ref 22–32)
Calcium: 8.8 mg/dL — ABNORMAL LOW (ref 8.9–10.3)
Chloride: 109 mmol/L (ref 98–111)
Creatinine, Ser: 1.46 mg/dL — ABNORMAL HIGH (ref 0.44–1.00)
GFR, Estimated: 33 mL/min — ABNORMAL LOW (ref >60)
Glucose, Bld: 137 mg/dL — ABNORMAL HIGH (ref 70–99)
Potassium: 3.9 mmol/L (ref 3.5–5.1)
Sodium: 134 mmol/L — ABNORMAL LOW (ref 135–145)

## 2023-05-11 LAB — VITAMIN B12: Vitamin B-12: 952 pg/mL — ABNORMAL HIGH (ref 180–914)

## 2023-05-11 LAB — FOLATE: Folate: 11.8 ng/mL (ref >5.9)

## 2023-05-11 MED ORDER — POLYETHYLENE GLYCOL 3350 17 G PO PACK: 17.0000 g | PACK | Freq: Every day | ORAL | Status: DC

## 2023-05-11 MED ORDER — SODIUM BICARBONATE 650 MG PO TABS: 650.0000 mg | ORAL_TABLET | Freq: Three times a day (TID) | ORAL | Status: AC

## 2023-05-11 MED ORDER — SODIUM BICARBONATE 650 MG PO TABS
650.0000 mg | ORAL_TABLET | Freq: Three times a day (TID) | ORAL | Status: DC
  Administered 2023-05-11 (×2): 650 mg via ORAL
  Filled 2023-05-11 (×2): qty 1

## 2023-05-11 MED ORDER — METOPROLOL SUCCINATE ER 25 MG PO TB24: 25.0000 mg | ORAL_TABLET | Freq: Every day | ORAL | Status: DC

## 2023-05-11 NOTE — TOC Progression Note (Signed)
Auth approved until 05/12/2022.

## 2023-05-11 NOTE — Discharge Summary (Signed)
 [facility] Discharge Summary   Patient: Tina Curtis FMW:[id number]  PCP: Tina Lame, MD  Date of admission: 05/06/2023   Date of discharge:  05/11/2023     Discharge Diagnoses:  Principal Problem:   Subdural hematoma (HCC)   Admitted From: ILF Disposition:  SNF   Recommendations for Outpatient Follow-up:  Follow-up with PCP, patient will be seen by an MD in 1 to 2 days, continue to monitor BP and titrate medication accordingly.  Repeat CBC and BMP after 1 week.  Check bicarbonate level and titrate dose accordingly. Follow with nephrology in 1 to 2 weeks Follow-up with neurosurgery in 1 to 2 weeks, may need repeat CT scan for follow-up on subdural hematoma.  Discontinued aspirin  and Plavix , can be resumed if needed after clearance by neurosurgery.  But patient remains at high risk of fall and rebleeding. Follow up LABS/TEST: CBC and BMP in 1 week, may need repeat CT head after follow-up with neurosurgery.   Follow-up Information     Tina Lame, MD Follow up.   Specialty: Family Medicine Why: Hospital follow up Contact information: [address] [facility] [place] [address] [phone]         Tina Redell BIRCH, MD Follow up in 2 week(s).   Specialty: Neurosurgery Contact information: [address] [place] [address] [phone]                Diet recommendation: Cardiac and Carb modified diet  Activity: The patient is advised to gradually reintroduce usual activities, as tolerated  Discharge Condition: stable  Code Status: DNR/DNI-Limited  History of present illness: As per the H and P dictated on admission. Hospital Course:  [name] is a 89 year old female patient with a past medical history of hypertension, NSTEMI, chronic diastolic dysfunction, metastatic bone cancer, type 2 diabetes mellitus previous CVA on dual antiplatelet therapy presented on 12/26 to [facility] for fall  complicated by subarachnoid hemorrhage.    On arrival to the ED she was awake alert hemodynamically stable without any neurodeficit.   CT head: mixed density subdural hematoma overlying the right cerebral hemisphere measuring 2 cm in thickness.  Notable mass effect with 5 mm leftward midline shift.  Also a new 3 mm hyperdense focus along the septum pellucid him possibly reflecting a small hemorrhage. She was evaluated by neurosurgery and family declined bur hole procedure at that time.  Repeat CT head showed stable subdural hematoma and subarachnoid hemorrhage. Her antiplatelets were discontinued.  PT/OT evaluated and recommended Snf at dc.   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 Repeat CT head if any change in neuro exam or worsening headache. Follow neurosurgery in 1 to 2 weeks, recommended to hold off dual antiplatelet therapy for now. PT/OT eval done, recommend SNF placement   #Hypertension #Hypertensive Urgency, resolved  Hx of Diastolic CHF, NSTEMI, CVA on DUAP, HLD Continue home Norvasc , Hydralazine , valsartan and Metoprolol  Monitor BP and titrate medications accordingly. #CKD Stage III: Continue oral hydration 1.5 L/day. Metabolic acidosis, started bicarbonate. Repeat BMP after 1 week and titrate dose of bicarbonate. Follow-up with nephrology in 1 to 2 weeks #T2Diabetes mellitus: Hemoglobin A1c 7.3 on 04/12/2023, well-controlled.  Mild hyperglycemia, patient is not on any medication at home.  Continue diabetic diet. #Anxiety and Depression: Continue home citalopram  and Cymbalta   Body mass index is 25.28 kg/m.  Nutrition Interventions:  - Patient was instructed, not to drive, operate heavy machinery, perform activities at heights, swimming or participation in water activities or provide baby sitting services while on Pain,  Sleep and Anxiety Medications; until her outpatient Physician has advised to do so again.  - Also recommended to not to take more than prescribed Pain, Sleep and Anxiety Medications.  Patient was seen by physical therapy, who recommended Therapy, SNF placement, which was arranged. On the day of the discharge the patient's vitals were stable, and no other acute medical condition were reported by patient. the patient was felt safe to be discharge at [facility].  Consultants: Neurosurgery Procedures: None  Discharge Exam: General: Appear in no distress, no Rash; Oral Mucosa Clear, moist. Cardiovascular: S1 and S2 Present, no Murmur, Respiratory: normal respiratory effort, Bilateral Air entry present and no Crackles, no wheezes Abdomen: Bowel Sound present, Soft and no tenderness, no hernia Extremities: no Pedal edema, no calf tenderness Neurology: Left sided weakness and left foot drop.  No any other focal deficits. affect appropriate.  Filed Weights   05/06/23 0950 05/07/23 1201  Weight: 63 kg 62.7 kg   Vitals:   05/11/23 0407 05/11/23 0746  BP: (!) 140/72 136/70  Pulse: 81 76  Resp: 18 17  Temp: 98 F (36.7 C) 98.5 F (36.9 C)  SpO2: 96% 98%    DISCHARGE MEDICATION: Allergies as of 05/11/2023       Reactions   Penicillins Anaphylaxis   Drug Ingredient [zinc ]    Empagliflozin  Rash        Medication List     STOP taking these medications    aspirin  EC 81 MG tablet   clopidogrel  75 MG tablet Commonly known as: PLAVIX        TAKE these medications    acetaminophen  325 MG tablet Commonly known as: TYLENOL  Take 650 mg by mouth 3 (three) times daily.   amLODipine  5 MG tablet Commonly known as: NORVASC  Take 1 tablet (5 mg total) by mouth every evening. Skip the dose if SBP less than 140 mmHg   anastrozole  1 MG tablet Commonly known as: ARIMIDEX  Take 1 tablet (1 mg total) by mouth daily.   Benefiber On The GO Pack Take 1 packet by mouth daily as needed.    calcitRIOL 0.25 MCG capsule Commonly known as: ROCALTROL Take 0.25 mcg by mouth as directed. Monday,Wednesday, Friday   calcium  carbonate 1250 (500 Ca) MG tablet Commonly known as: OS-CAL - dosed in mg of elemental calcium  Take 2 tablets by mouth daily with breakfast.   citalopram  10 MG tablet Commonly known as: CELEXA  Take 10 mg by mouth daily.   cyanocobalamin  1000 MCG tablet Commonly known as: VITAMIN B12 Take 1,000 mcg by mouth daily.   docusate sodium  100 MG capsule Commonly known as: COLACE Take 100 mg by mouth daily as needed for mild constipation.   DULoxetine  60 MG capsule Commonly known as: CYMBALTA  Take 60 mg by mouth daily. What changed: Another medication with the same name was removed. Continue taking this medication, and follow the directions you see here.   gabapentin  300 MG capsule Commonly known as: NEURONTIN  Take 300 mg by mouth 2 (two) times daily.   hydrALAZINE  50 MG tablet Commonly known as: APRESOLINE  Take 1 tablet (50 mg total) by mouth 3 (three) times daily. Skip the dose if SBP less than 130 mmHg What changed: how much to take   loratadine  10 MG tablet Commonly known as:  CLARITIN  Take 10 mg by mouth daily as needed for allergies. AM   metoprolol  succinate 25 MG 24 hr tablet Commonly known as: TOPROL -XL Take 1 tablet (25 mg total) by mouth daily. What changed: when to take this   polyethylene glycol 17 g packet Commonly known as: MIRALAX  / GLYCOLAX  Take 17 g by mouth daily. Skip the dose if no constipation   simvastatin  20 MG tablet Commonly known as: ZOCOR  Take 20 mg by mouth every evening. PM   sodium bicarbonate  650 MG tablet Take 1 tablet (650 mg total) by mouth 3 (three) times daily.   triamcinolone  cream 0.1 % Commonly known as: KENALOG  Apply 1 Application topically 2 (two) times daily.   valsartan 80 MG tablet Commonly known as: DIOVAN Take 40 mg by mouth daily.   Vitamin D  (Ergocalciferol ) 1.25 MG (50000 UNIT) Caps  capsule Commonly known as: DRISDOL  Take 50,000 Units by mouth every 7 (seven) days.   Voltaren  1 % Gel Generic drug: diclofenac  Sodium Apply 2 g topically every 6 (six) hours. To bilateral legs.       Allergies  Allergen Reactions   Penicillins Anaphylaxis   Drug Ingredient [Zinc ]    Empagliflozin  Rash   Discharge Instructions     Call MD for:  difficulty breathing, headache or visual disturbances   Complete by: As directed    Call MD for:  extreme fatigue   Complete by: As directed    Call MD for:  persistant dizziness or light-headedness   Complete by: As directed    Call MD for:  persistant nausea and vomiting   Complete by: As directed    Call MD for:  severe uncontrolled pain   Complete by: As directed    Call MD for:  temperature >100.4   Complete by: As directed    Diet - low sodium heart healthy   Complete by: As directed    Discharge instructions   Complete by: As directed    Follow-up with PCP, patient will be seen by an MD in 1 to 2 days, continue to monitor BP and titrate medication accordingly.  Repeat CBC and BMP after 1 week.  Check bicarbonate level and titrate dose accordingly. Follow with nephrology in 1 to 2 weeks Follow-up with neurosurgery in 1 to 2 weeks, may need repeat CT scan for follow-up on subdural hematoma.  Discontinued aspirin  and Plavix , can be resumed if needed after clearance by neurosurgery.  But patient remains at high risk of fall and rebleeding.   Increase activity slowly   Complete by: As directed        The results of significant diagnostics from this hospitalization (including imaging, microbiology, ancillary and laboratory) are listed below for reference.    Significant Diagnostic Studies: ECHOCARDIOGRAM COMPLETE Result Date: 05/07/2023    ECHOCARDIOGRAM REPORT   Patient Name:   CARLTON SWEANEY Date of Exam: 05/07/2023 Medical Rec #:  [id number]     Height:       62.0 in Accession #:    [id number]    Weight:       138.2 lb Date  of Birth:  03/21/1927    BSA:          1.634 m Patient Age:    89 years      BP:           142/63 mmHg Patient Gender: F             HR:  94 bpm. Exam Location:  [facility] Procedure: 2D Echo, Cardiac Doppler and Color Doppler Indications:     Stroke  History:         Patient has prior history of Echocardiogram examinations, most                  recent 08/27/2022. CHF, Previous Myocardial Infarction, Stroke;                  Signs/Symptoms:Chest Pain, Dizziness/Lightheadedness and                  Fatigue. CKD, breast CA.  Sonographer:     Naomie Reef Referring Phys:  [id number] LONELL KANDICE MOOSE Diagnosing Phys: Evalene Lunger MD  Sonographer Comments: Technically difficult study due to poor echo windows and suboptimal parasternal window. IMPRESSIONS  1. Left ventricular ejection fraction, by estimation, is 60 to 65%. The left ventricle has normal function. The left ventricle has no regional wall motion abnormalities. Left ventricular diastolic parameters are consistent with Grade I diastolic dysfunction (impaired relaxation).  2. Right ventricular systolic function is normal. The right ventricular size is normal. There is mildly elevated pulmonary artery systolic pressure. The estimated right ventricular systolic pressure is 41.9 mmHg.  3. The mitral valve is normal in structure. No evidence of mitral valve regurgitation. No evidence of mitral stenosis.  4. The aortic valve is normal in structure. There is mild calcification of the aortic valve. Aortic valve regurgitation is mild. Aortic valve sclerosis/calcification is present, without any evidence of aortic stenosis. Aortic valve mean gradient measures 8.0 mmHg.  5. The inferior vena cava is normal in size with greater than 50% respiratory variability, suggesting right atrial pressure of 3 mmHg. FINDINGS  Left Ventricle: Left ventricular ejection fraction, by estimation, is 60 to 65%. The left ventricle has normal function. The left ventricle has no regional  wall motion abnormalities. The left ventricular internal cavity size was normal in size. There is  no left ventricular hypertrophy. Left ventricular diastolic parameters are consistent with Grade I diastolic dysfunction (impaired relaxation). Right Ventricle: The right ventricular size is normal. No increase in right ventricular wall thickness. Right ventricular systolic function is normal. There is mildly elevated pulmonary artery systolic pressure. The tricuspid regurgitant velocity is 2.91  m/s, and with an assumed right atrial pressure of 8 mmHg, the estimated right ventricular systolic pressure is 41.9 mmHg. Left Atrium: Left atrial size was normal in size. Right Atrium: Right atrial size was normal in size. Pericardium: There is no evidence of pericardial effusion. Mitral Valve: The mitral valve is normal in structure. There is mild calcification of the mitral valve leaflet(s). No evidence of mitral valve regurgitation. No evidence of mitral valve stenosis. MV peak gradient, 5.8 mmHg. The mean mitral valve gradient  is 3.0 mmHg. Tricuspid Valve: The tricuspid valve is normal in structure. Tricuspid valve regurgitation is mild . No evidence of tricuspid stenosis. Aortic Valve: The aortic valve is normal in structure. There is mild calcification of the aortic valve. Aortic valve regurgitation is mild. Aortic valve sclerosis/calcification is present, without any evidence of aortic stenosis. Aortic valve mean gradient measures 8.0 mmHg. Aortic valve peak gradient measures 17.1 mmHg. Aortic valve area, by VTI measures 2.43 cm. Pulmonic Valve: The pulmonic valve was normal in structure. Pulmonic valve regurgitation is not visualized. No evidence of pulmonic stenosis. Aorta: The aortic root is normal in size and structure. Venous: The inferior vena cava is normal in size with greater than 50% respiratory variability,  suggesting right atrial pressure of 3 mmHg. IAS/Shunts: No atrial level shunt detected by color flow  Doppler.  LEFT VENTRICLE PLAX 2D LVIDd:         3.00 cm   Diastology LVIDs:         1.90 cm   LV e' medial:    5.55 cm/s LV PW:         0.80 cm   LV E/e' medial:  17.3 LV IVS:        1.40 cm   LV e' lateral:   5.98 cm/s LVOT diam:     1.90 cm   LV E/e' lateral: 16.0 LV SV:         87 LV SV Index:   53 LVOT Area:     2.84 cm  RIGHT VENTRICLE RV Basal diam:  3.00 cm RV Mid diam:    2.20 cm RV S prime:     20.40 cm/s TAPSE (M-mode): 2.6 cm LEFT ATRIUM             Index        RIGHT ATRIUM           Index LA diam:        2.90 cm 1.77 cm/m   RA Area:     11.90 cm LA Vol (A2C):   51.4 ml 31.46 ml/m  RA Volume:   23.70 ml  14.50 ml/m LA Vol (A4C):   36.5 ml 22.34 ml/m LA Biplane Vol: 45.8 ml 28.03 ml/m  AORTIC VALVE AV Area (Vmax):    2.08 cm AV Area (Vmean):   2.03 cm AV Area (VTI):     2.43 cm AV Vmax:           207.00 cm/s AV Vmean:          132.000 cm/s AV VTI:            0.357 m AV Peak Grad:      17.1 mmHg AV Mean Grad:      8.0 mmHg LVOT Vmax:         152.00 cm/s LVOT Vmean:        94.700 cm/s LVOT VTI:          0.306 m LVOT/AV VTI ratio: 0.86  AORTA Ao Root diam: 3.10 cm MITRAL VALVE                TRICUSPID VALVE MV Area (PHT): 3.23 cm     TR Peak grad:   33.9 mmHg MV Area VTI:   3.34 cm     TR Vmax:        291.00 cm/s MV Peak grad:  5.8 mmHg MV Mean grad:  3.0 mmHg     SHUNTS MV Vmax:       1.20 m/s     Systemic VTI:  0.31 m MV Vmean:      78.9 cm/s    Systemic Diam: 1.90 cm MV Decel Time: 235 msec MV E velocity: 95.90 cm/s MV A velocity: 134.00 cm/s MV E/A ratio:  0.72 Evalene Lunger MD Electronically signed by Evalene Lunger MD Signature Date/Time: 05/07/2023/3:01:11 PM    Final    CT HEAD WO CONTRAST ( ) Result Date: 05/06/2023 CLINICAL DATA:  Subdural hematoma SDH expansion evaluation EXAM: CT HEAD WITHOUT CONTRAST TECHNIQUE: Contiguous axial images were obtained from the base of the skull through the vertex without intravenous contrast. RADIATION DOSE REDUCTION: This exam was performed  according to the departmental dose-optimization program which includes automated  exposure control, adjustment of the mA and/or kV according to patient size and/or use of iterative reconstruction technique. COMPARISON:  Same-day head CT FINDINGS: Brain: No significant change in size of the mixed density right cerebral convexity subdural hematoma measuring up to 2.9 cm in thickness. Unchanged 6 mm leftward midline shift. No new sites of hemorrhage. No CT evidence of an acute cortical infarct. No hydrocephalus. Unchanged 2 mm hyperdense focus along the septum pellucidum Vascular: No hyperdense vessel or unexpected calcification. Skull: Normal. Negative for fracture or focal lesion. Sinuses/Orbits: Small right mastoid effusion. No middle ear effusion. Paranasal sinuses are clear. Bilateral lens replacement. Orbits are otherwise unremarkable. Other: None. IMPRESSION: 1. No significant change in size of the mixed density right cerebral convexity subdural hematoma measuring up to 2.9 cm in thickness. Unchanged 6 mm leftward midline shift. 2. Unchanged 2 mm hyperdense focus along the septum pellucidum. Electronically Signed   By: Lyndall Gore M.D.   On: 05/06/2023 17:08   CT Head Wo Contrast Result Date: 05/06/2023 CLINICAL DATA:  Provided history: Head trauma, minor. Neck trauma. Additional history provided: Fall. EXAM: CT HEAD WITHOUT CONTRAST CT CERVICAL SPINE WITHOUT CONTRAST TECHNIQUE: Multidetector CT imaging of the head and cervical spine was performed following the standard protocol without intravenous contrast. Multiplanar CT image reconstructions of the cervical spine were also generated. RADIATION DOSE REDUCTION: This exam was performed according to the departmental dose-optimization program which includes automated exposure control, adjustment of the mA and/or kV according to patient size and/or use of iterative reconstruction technique. COMPARISON:  Head CT 03/29/2023.  Cervical spine CT 03/29/2023.  FINDINGS: CT HEAD FINDINGS Brain: Generalized cerebral atrophy. Mixed density subdural hematoma overlying the right cerebral hemisphere, measuring 2 cm in thickness. A significant portion of the hematoma is comprised of hyperdense acute components. There is mass effect upon the underlying right cerebral hemisphere with 5 mm leftward midline shift. 3 mm hyperdense focus along the septum pellucidum, new from the prior head CT of 03/29/2023. This could potentially reflect a small amount of hemorrhage within the septum pellucidum or within the left lateral ventricle (series 2, image 16) (series 4, image 33). Sequelae of mild chronic small vessel ischemic disease. Prominent perivascular space within the left basal ganglia inferiorly. No demarcated cortical infarct. No evidence of an intracranial mass. Vascular: No hyperdense vessel.  Atherosclerotic calcifications. Skull: No calvarial fracture or aggressive osseous lesion. Sinuses/Orbits: No mass or acute finding within the imaged orbits. No significant paranasal sinus disease at the imaged levels. CT CERVICAL SPINE FINDINGS Alignment: 2 mm grade 1 anterolisthesis at C7-T1, T1-T2 and T2-T3. Skull base and vertebrae: The basion-dental and atlanto-dental intervals are maintained.No evidence of acute fracture to the cervical spine. 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 Soft tissues and spinal canal: No prevertebral fluid or swelling. No visible canal hematoma. Disc levels: Cervical spondylosis with multilevel disc space narrowing, disc bulges/central disc protrusions, posterior disc osteophyte complexes, uncovertebral hypertrophy and facet arthrosis. Disc space narrowing is greatest at C3-C4 (advanced), C5-C6 (moderate-to-advanced), C6-C7 (moderate-to-advanced) and T2-T3 (moderate-to-advanced). No appreciable high-grade spinal canal stenosis. Multilevel bony neural foraminal narrowing. Upper chest: No acute finding. CT head results were called by telephone at the time of  interpretation on 05/06/2023 at 11:48 am to provider [facility] , who verbally acknowledged these results. IMPRESSION: CT head: 1. Mixed density subdural hematoma overlying the right cerebral hemisphere, measuring 2 cm in thickness. A significant portion of the hematoma is comprised of acute components. Mass effect upon the underlying right cerebral  hemisphere with 5 mm leftward midline shift. 2. 3 mm hyperdense focus along the septum pellucidum, which could potentially reflect a small amount of hemorrhage within the septum pellucidum or within the left lateral ventricle. Attention recommended on imaging follow-up. CT cervical spine: 1. No evidence of an acute cervical spine fracture. 2. Mild grade 1 anterolisthesis at C7-T1, T1-T2 and T2-T3, unchanged from the prior cervical spine CT of 03/29/2023. 3. Cervical spondylosis as described. 4. 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 Electronically Signed   By: Rockey Childs D.O.   On: 05/06/2023 12:02   CT Cervical Spine Wo Contrast Result Date: 05/06/2023 CLINICAL DATA:  Provided history: Head trauma, minor. Neck trauma. Additional history provided: Fall. EXAM: CT HEAD WITHOUT CONTRAST CT CERVICAL SPINE WITHOUT CONTRAST TECHNIQUE: Multidetector CT imaging of the head and cervical spine was performed following the standard protocol without intravenous contrast. Multiplanar CT image reconstructions of the cervical spine were also generated. RADIATION DOSE REDUCTION: This exam was performed according to the departmental dose-optimization program which includes automated exposure control, adjustment of the mA and/or kV according to patient size and/or use of iterative reconstruction technique. COMPARISON:  Head CT 03/29/2023.  Cervical spine CT 03/29/2023. FINDINGS: CT HEAD FINDINGS Brain: Generalized cerebral atrophy. Mixed density subdural hematoma overlying the right cerebral hemisphere, measuring 2 cm in thickness. A significant portion of the hematoma is comprised  of hyperdense acute components. There is mass effect upon the underlying right cerebral hemisphere with 5 mm leftward midline shift. 3 mm hyperdense focus along the septum pellucidum, new from the prior head CT of 03/29/2023. This could potentially reflect a small amount of hemorrhage within the septum pellucidum or within the left lateral ventricle (series 2, image 16) (series 4, image 33). Sequelae of mild chronic small vessel ischemic disease. Prominent perivascular space within the left basal ganglia inferiorly. No demarcated cortical infarct. No evidence of an intracranial mass. Vascular: No hyperdense vessel.  Atherosclerotic calcifications. Skull: No calvarial fracture or aggressive osseous lesion. Sinuses/Orbits: No mass or acute finding within the imaged orbits. No significant paranasal sinus disease at the imaged levels. CT CERVICAL SPINE FINDINGS Alignment: 2 mm grade 1 anterolisthesis at C7-T1, T1-T2 and T2-T3. Skull base and vertebrae: The basion-dental and atlanto-dental intervals are maintained.No evidence of acute fracture to the cervical spine. 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 Soft tissues and spinal canal: No prevertebral fluid or swelling. No visible canal hematoma. Disc levels: Cervical spondylosis with multilevel disc space narrowing, disc bulges/central disc protrusions, posterior disc osteophyte complexes, uncovertebral hypertrophy and facet arthrosis. Disc space narrowing is greatest at C3-C4 (advanced), C5-C6 (moderate-to-advanced), C6-C7 (moderate-to-advanced) and T2-T3 (moderate-to-advanced). No appreciable high-grade spinal canal stenosis. Multilevel bony neural foraminal narrowing. Upper chest: No acute finding. CT head results were called by telephone at the time of interpretation on 05/06/2023 at 11:48 am to provider [facility] , who verbally acknowledged these results. IMPRESSION: CT head: 1. Mixed density subdural hematoma overlying the right cerebral hemisphere, measuring 2 cm  in thickness. A significant portion of the hematoma is comprised of acute components. Mass effect upon the underlying right cerebral hemisphere with 5 mm leftward midline shift. 2. 3 mm hyperdense focus along the septum pellucidum, which could potentially reflect a small amount of hemorrhage within the septum pellucidum or within the left lateral ventricle. Attention recommended on imaging follow-up. CT cervical spine: 1. No evidence of an acute cervical spine fracture. 2. Mild grade 1 anterolisthesis at C7-T1, T1-T2 and T2-T3, unchanged from the prior cervical  spine CT of 03/29/2023. 3. Cervical spondylosis as described. 4. 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 Electronically Signed   By: Rockey Childs D.O.   On: 05/06/2023 12:02   CT PELVIS WO CONTRAST Result Date: 05/06/2023 CLINICAL DATA:  Hip trauma, fracture suspected bruising of left leg EXAM: CT PELVIS WITHOUT CONTRAST TECHNIQUE: Multidetector CT imaging of the pelvis was performed following the standard protocol without intravenous contrast. RADIATION DOSE REDUCTION: This exam was performed according to the departmental dose-optimization program which includes automated exposure control, adjustment of the mA and/or kV according to patient size and/or use of iterative reconstruction technique. COMPARISON:  Radiograph 05/06/2023 and CT 08/22/2022 FINDINGS: Urinary Tract:  No acute abnormality. Bowel:  No acute abnormality. Vascular/Lymphatic: Arterial atherosclerotic calcification. 40 mm infrarenal abdominal aortic aneurysm. No lymphadenopathy. Reproductive:  Hysterectomy. Other:  No free intraperitoneal fluid or air. Musculoskeletal: Demineralization. No acute fracture or dislocation. IMPRESSION: 1. No acute fracture or dislocation. 2. 40 mm infrarenal abdominal aortic aneurysm. Recommend follow-up CT or MR as appropriate in 12 months and referral to or continued care with vascular specialist. (Ref.: J Vasc Surg. 2018; 67:2-77 and J Am Coll Radiol  2013;10(10):789-794.) Electronically Signed   By: Norman Gatlin M.D.   On: 05/06/2023 11:41   DG FEMUR MIN 2 VIEWS LEFT Result Date: 05/06/2023 CLINICAL DATA:  Fall, pain. EXAM: LEFT FEMUR 2 VIEWS COMPARISON:  None Available. FINDINGS: There is no evidence of fracture or other focal bone lesions. Soft tissues are unremarkable. IMPRESSION: Negative. Electronically Signed   By: Fonda Field M.D.   On: 05/06/2023 11:23   DG Pelvis 1-2 Views Result Date: 05/06/2023 CLINICAL DATA:  Fall, pain. EXAM: PELVIS - 1 VIEW COMPARISON:  None Available. FINDINGS: There is no evidence of pelvic fracture or diastasis. No pelvic bone lesions are seen. Lumbosacral degenerative changes. Bilateral sacroiliac osteophytes. Right-sided hip degenerative joint disease. IMPRESSION: Degenerative changes. Electronically Signed   By: Fonda Field M.D.   On: 05/06/2023 11:22   DG Knee Complete 4 Views Left Result Date: 05/06/2023 CLINICAL DATA:  injury.  Fall.  Painful to stand. EXAM: LEFT KNEE - COMPLETE 4+ VIEW COMPARISON:  None Available. FINDINGS: No acute fracture or dislocation. No aggressive osseous lesion. There are moderate degenerative changes of the knee joint. Meniscal chondrocalcinosis noted. No  focal soft tissue swelling. No radiopaque foreign bodies. IMPRESSION: *No acute osseous abnormality of the left knee joint. Moderate degenerative osteoarthritis. Electronically Signed   By: Ree Molt M.D.   On: 05/06/2023 10:17    Microbiology: Recent Results (from the past 240 hours)  MRSA Next Gen by PCR, Nasal     Status: None   Collection Time: 05/07/23 12:03 PM   Specimen: Nasal Mucosa; Nasal Swab  Result Value Ref Range Status   MRSA by PCR Next Gen NOT DETECTED NOT DETECTED Final    Comment: (NOTE) The GeneXpert MRSA Assay (FDA approved for NASAL specimens only), is one component of a comprehensive MRSA colonization surveillance program. It is not intended to diagnose MRSA infection nor to  guide or monitor treatment for MRSA infections. Test performance is not FDA approved in patients less than 41 years old. Performed at [facility], [address] [place], [place] [address]      Labs: CBC: Recent Labs  Lab 05/06/23 1051 05/07/23 0614 05/08/23 0402 05/09/23 0436 05/10/23 0210 05/11/23 0515  WBC 7.7 7.1 6.9 7.0 11.9* 7.5  NEUTROABS 4.8  --   --   --   --   --  HGB 11.1* 10.9* 11.0* 10.9* 10.4* 10.6*  HCT 33.2* 32.6* 32.0* 31.0* 30.0* 30.8*  MCV 94.1 95.3 92.0 92.3 90.4 91.9  PLT 218 207 204 196 190 199   Basic Metabolic Panel: Recent Labs  Lab 05/07/23 0614 05/08/23 0402 05/09/23 0436 05/10/23 0210 05/11/23 0515  NA 140 136 135 133* 134*  K 4.3 3.7 3.8 4.0 3.9  CL 113* 108 106 105 109  CO2 18* 19* 18* 19* 17*  GLUCOSE 123* 136* 119* 137* 137*  BUN 30* 31* 34* 39* 45*  CREATININE 1.27* 1.31* 1.47* 1.56* 1.46*  CALCIUM  9.2 8.9 8.8* 8.6* 8.8*  MG  --  1.7 2.0  --   --   PHOS  --  3.3 3.4 2.9  --    Liver Function Tests: Recent Labs  Lab 05/08/23 0402 05/09/23 0436 05/10/23 0210  ALBUMIN  3.6 3.5 3.5   No results for input(s): LIPASE, AMYLASE in the last 168 hours. No results for input(s): AMMONIA in the last 168 hours. Cardiac Enzymes: No results for input(s): CKTOTAL, CKMB, CKMBINDEX, TROPONINI in the last 168 hours. BNP (last 3 results) Recent Labs    08/26/22 0526 08/27/22 0742  BNP 621.0* 1,712.6*   CBG: Recent Labs  Lab 05/07/23 1204 05/08/23 2226  GLUCAP 102* 253*    Time spent: 35 minutes  Signed:  Elvan Sor  [facility] 05/11/2023 12:51 PM

## 2023-05-11 NOTE — Plan of Care (Signed)
  Problem: Education: Goal: Knowledge of disease or condition will improve Outcome: Progressing Goal: Knowledge of secondary prevention will improve (MUST DOCUMENT ALL) Outcome: Progressing Goal: Knowledge of patient specific risk factors will improve Loraine Leriche N/A or DELETE if not current risk factor) Outcome: Progressing   Problem: Intracerebral Hemorrhage Tissue Perfusion: Goal: Complications of Intracerebral Hemorrhage will be minimized Outcome: Progressing   Problem: Coping: Goal: Will verbalize positive feelings about self Outcome: Progressing Goal: Will identify appropriate support needs Outcome: Progressing   Problem: Health Behavior/Discharge Planning: Goal: Ability to manage health-related needs will improve Outcome: Progressing Goal: Goals will be collaboratively established with patient/family Outcome: Progressing   Problem: Self-Care: Goal: Ability to participate in self-care as condition permits will improve Outcome: Progressing Goal: Verbalization of feelings and concerns over difficulty with self-care will improve Outcome: Progressing Goal: Ability to communicate needs accurately will improve Outcome: Progressing   Problem: Nutrition: Goal: Risk of aspiration will decrease Outcome: Progressing Goal: Dietary intake will improve Outcome: Progressing   Problem: Education: Goal: Knowledge of General Education information will improve Description: Including pain rating scale, medication(s)/side effects and non-pharmacologic comfort measures Outcome: Progressing   Problem: Health Behavior/Discharge Planning: Goal: Ability to manage health-related needs will improve Outcome: Progressing   Problem: Clinical Measurements: Goal: Ability to maintain clinical measurements within normal limits will improve Outcome: Progressing Goal: Will remain free from infection Outcome: Progressing Goal: Diagnostic test results will improve Outcome: Progressing Goal:  Respiratory complications will improve Outcome: Progressing Goal: Cardiovascular complication will be avoided Outcome: Progressing   Problem: Activity: Goal: Risk for activity intolerance will decrease Outcome: Progressing   Problem: Nutrition: Goal: Adequate nutrition will be maintained Outcome: Progressing   Problem: Coping: Goal: Level of anxiety will decrease Outcome: Progressing   Problem: Elimination: Goal: Will not experience complications related to bowel motility Outcome: Progressing Goal: Will not experience complications related to urinary retention Outcome: Progressing   Problem: Pain Management: Goal: General experience of comfort will improve Outcome: Progressing   Problem: Safety: Goal: Ability to remain free from injury will improve Outcome: Progressing   Problem: Skin Integrity: Goal: Risk for impaired skin integrity will decrease Outcome: Progressing

## 2023-05-11 NOTE — TOC Transition Note (Addendum)
 Transition of Care [facility]) - Discharge Note   Patient Details  Name: Tina Curtis MRN: [id number] Date of Birth: 12-Nov-1926  [facility]SW Contact:  Ladene Lady, LCSW Phone Number: 05/11/2023, 11:43 AM   Clinical Narrative:  Pt discharging to [facility]. DC summary to be sent once in. Medical necessitate printed to the unit. Alfonso with [facility] notified. [name] notified.    Final next level of care: Skilled Nursing Facility Barriers to Discharge: Barriers Resolved   Patient Goals and CMS Choice Patient states their goals for this hospitalization and ongoing recovery are:: go to [facility] CMS Medicare.gov Compare Post Acute Care list provided to:: Patient        Discharge Placement                Patient to be transferred to facility by: [facility]   Patient and family notified of of transfer: 05/11/23  Discharge Plan and Services Additional resources added to the After Visit Summary for                                       Social Drivers of Health (SDOH) Interventions SDOH Screenings   Food Insecurity: No Food Insecurity (05/07/2023)  Housing: Low Risk  (05/07/2023)  Transportation Needs: No Transportation Needs (05/07/2023)  Utilities: Not At Risk (05/07/2023)  Financial Resource Strain: Low Risk  (11/17/2022)   Received from [facility], [facility]  Tobacco Use: Low Risk  (05/06/2023)     Readmission Risk Interventions    05/07/2023   12:51 PM 06/08/2022    3:30 PM  Readmission Risk Prevention Plan  Transportation Screening Complete Complete  PCP or Specialist Appt within 3-5 Days Complete   HRI or Home Care Consult Complete   Social Work Consult for Recovery Care Planning/Counseling Complete   Palliative Care Screening Complete Not Applicable  Medication Review Ocean[facility]) Complete Complete

## 2023-05-11 NOTE — Progress Notes (Signed)
Report given to Karilyn Cota, Charity fundraiser at [employer]. All questions answered.

## 2023-05-13 ENCOUNTER — Telehealth: Payer: Self-pay | Admitting: Neurosurgery

## 2023-05-13 DIAGNOSIS — S065XAA Traumatic subdural hemorrhage with loss of consciousness status unknown, initial encounter: Secondary | ICD-10-CM

## 2023-05-13 NOTE — Telephone Encounter (Signed)
 Nurse from [facility] calling to schedule her a hospital f/u for subdural hematoma. Will she need a new CT scan? [facility] from 05/06/2023-05/11/2023 and Dr.Sindelar was on call.

## 2023-05-13 NOTE — Telephone Encounter (Signed)
 CT has been ordered (to be done around 06/10/23). Would need a new patient appointment with our office after CT.

## 2023-05-14 ENCOUNTER — Non-Acute Institutional Stay (SKILLED_NURSING_FACILITY): Payer: Medicare PPO | Admitting: Student

## 2023-05-14 ENCOUNTER — Encounter: Payer: Self-pay | Admitting: Student

## 2023-05-14 DIAGNOSIS — E1122 Type 2 diabetes mellitus with diabetic chronic kidney disease: Secondary | ICD-10-CM

## 2023-05-14 DIAGNOSIS — S065X0D Traumatic subdural hemorrhage without loss of consciousness, subsequent encounter: Secondary | ICD-10-CM | POA: Diagnosis not present

## 2023-05-14 DIAGNOSIS — I5032 Chronic diastolic (congestive) heart failure: Secondary | ICD-10-CM | POA: Diagnosis not present

## 2023-05-14 DIAGNOSIS — F339 Major depressive disorder, recurrent, unspecified: Secondary | ICD-10-CM

## 2023-05-14 DIAGNOSIS — C7951 Secondary malignant neoplasm of bone: Secondary | ICD-10-CM

## 2023-05-14 DIAGNOSIS — R296 Repeated falls: Secondary | ICD-10-CM

## 2023-05-14 DIAGNOSIS — M316 Other giant cell arteritis: Secondary | ICD-10-CM

## 2023-05-14 DIAGNOSIS — N184 Chronic kidney disease, stage 4 (severe): Secondary | ICD-10-CM | POA: Diagnosis not present

## 2023-05-14 DIAGNOSIS — R531 Weakness: Secondary | ICD-10-CM

## 2023-05-14 DIAGNOSIS — I609 Nontraumatic subarachnoid hemorrhage, unspecified: Secondary | ICD-10-CM

## 2023-05-14 DIAGNOSIS — I10 Essential (primary) hypertension: Secondary | ICD-10-CM

## 2023-05-14 DIAGNOSIS — J439 Emphysema, unspecified: Secondary | ICD-10-CM

## 2023-05-14 DIAGNOSIS — N1832 Chronic kidney disease, stage 3b: Secondary | ICD-10-CM

## 2023-05-14 NOTE — Telephone Encounter (Signed)
 Tina Curtis is aware to call for the CT around 06/10/23, we will then call and schedule office visit.

## 2023-05-14 NOTE — Progress Notes (Signed)
 Location:  Other [place].  Nursing Home Room Number: [facility] [address] Place of Service:  SNF [phone]) Provider:  Dr. Richerd Brigham.   PCP: Diedra Lame, MD  Patient Care Team: Diedra Lame, MD as PCP - General (Family Medicine) Rennie Cindy SAUNDERS, MD as Consulting Physician (Internal Medicine) Dessa Reyes ORN, MD as Consulting Physician (General Surgery) Dannielle Arlean FALCON, RN (Inactive) as Oncology Nurse Navigator Lenn Aran, MD as Referring Physician (Radiation Oncology)  Extended Emergency Contact Information Primary Emergency Contact: [facility] Phone: [phone] Relation: Daughter Secondary Emergency Contact: Rider,Kimberly Mobile Phone: [phone] Relation: Daughter  Code Status:  DNR Goals of care: Advanced Directive information    05/14/2023    9:09 AM  Advanced Directives  Does Patient Have a Medical Advance Directive? Yes  Type of Advance Directive Out of facility DNR (pink MOST or yellow form)  Does patient want to make changes to medical advance directive? No - Patient declined     Chief Complaint  Patient presents with   Admission    Admission.     HPI:  Pt is a 89 y.o. female seen today for Admission to [facility]. With history of falls was admitted on 12/26 for acute on chronic subdural hematoma after a ground level fall. She isn't thinking as clearly as before. Chronology of events continues to be an issue.    She wonders why she has weakness on the left side -- in the hand and on the leg. She has foot drop as well. No acute stroke on the CT scan, however, it is on the right sid and she has left sided problems. All worsened since her falls.   Thsi fall - was on Christmas day. She was cooking in her kitchen cracking an egg and it fell on the floor. It fell on her foot and she didn't take a step. She bent over and thinks her blood pressure changed and she slid in the egg and fell. This was on Christmas Eve. On the 25th, her niece  came and was concerned about her. It's unclear about how she navigated doing things that day. On the 26th she has an aid who comes to help her put on compression socks and shower. CNA insisted that she go to the hospital.   She could walk on 12/25, but now can't walk independently. She has been in the wheelchair.   The left leg weakness wasn't happening in the hospital.   Of note, she was admitted for therapy in November after a ground level fall with a subdural hematoma at that time.   She is sad about the potential of no longer returning to her home and living independently.    Past Medical History:  Diagnosis Date   Arthritis    Gout   Chronic kidney disease    Diarrhea    Hypercholesteremia    Hypertension    Neuropathy    feet and lower legs   Seasonal allergies    Skin cancer    face   Past Surgical History:  Procedure Laterality Date   ABDOMINAL HYSTERECTOMY  2000   APPENDECTOMY     BREAST BIOPSY Right 09/19/2019   affirm bx of calcs UOQ, x marker, path pending   BREAST BIOPSY Right 09/19/2019   us  bx of mass,heart marker, path pending   BREAST BIOPSY Right 09/19/2019   us  bx of LN, coil marker, path pending   CATARACT EXTRACTION Left    CATARACT EXTRACTION W/PHACO Right 10/24/2014   Procedure:  CATARACT EXTRACTION PHACO AND INTRAOCULAR LENS PLACEMENT (IOC);  Surgeon: Dene Etienne, MD;  Location: [facility];  Service: Ophthalmology;  Laterality: Right;   ESOPHAGOGASTRODUODENOSCOPY N/A 03/07/2018   Procedure: ESOPHAGOGASTRODUODENOSCOPY (EGD);  Surgeon: Unk Corinn Skiff, MD;  Location: [facility] ENDOSCOPY;  Service: Gastroenterology;  Laterality: N/A;   MASTECTOMY MODIFIED RADICAL Right 10/13/2019   Procedure: MASTECTOMY MODIFIED RADICAL;  Surgeon: Dessa Reyes ORN, MD;  Location: [facility] ORS;  Service: General;  Laterality: Right;   RENAL ANGIOGRAPHY Right 04/11/2018   Procedure: RENAL ANGIOGRAPHY;  Surgeon: Marea Selinda RAMAN, MD;  Location: [facility] INVASIVE CV LAB;   Service: Cardiovascular;  Laterality: Right;   SIMPLE MASTECTOMY WITH AXILLARY SENTINEL NODE BIOPSY Left 10/13/2019   Procedure: SIMPLE MASTECTOMY TRUE CUT BIOPSY, SENTINEL NODE BIOPSY;  Surgeon: Dessa Reyes ORN, MD;  Location: [facility] ORS;  Service: General;  Laterality: Left;    Allergies  Allergen Reactions   Penicillins Anaphylaxis   Drug Ingredient [Zinc ]    Empagliflozin  Rash    Outpatient Encounter Medications as of 05/14/2023  Medication Sig   acetaminophen  (TYLENOL ) 325 MG tablet Take 650 mg by mouth 3 (three) times daily.   amLODipine  (NORVASC ) 5 MG tablet Take 1 tablet (5 mg total) by mouth every evening. Skip the dose if SBP less than 140 mmHg   anastrozole  (ARIMIDEX ) 1 MG tablet Take 1 tablet (1 mg total) by mouth daily.   calcitRIOL (ROCALTROL) 0.25 MCG capsule Take 0.25 mcg by mouth as directed. Monday,Wednesday, Friday   calcium  carbonate (OS-CAL - DOSED IN MG OF ELEMENTAL CALCIUM ) 1250 (500 Ca) MG tablet Take 2 tablets by mouth daily with breakfast.   citalopram  (CELEXA ) 10 MG tablet Take 10 mg by mouth daily.    cyanocobalamin  (VITAMIN B12) 1000 MCG tablet Take 1,000 mcg by mouth daily.   diclofenac  Sodium (VOLTAREN ) 1 % GEL Apply 2 g topically every 6 (six) hours. To bilateral legs.   docusate sodium  (COLACE) 100 MG capsule Take 100 mg by mouth daily as needed for mild constipation.   DULoxetine  (CYMBALTA ) 60 MG capsule Take 60 mg by mouth daily.   gabapentin  (NEURONTIN ) 300 MG capsule Take 300 mg by mouth 2 (two) times daily.   hydrALAZINE  (APRESOLINE ) 50 MG tablet Take 1 tablet (50 mg total) by mouth 3 (three) times daily. Skip the dose if SBP less than 130 mmHg   loratadine  (CLARITIN ) 10 MG tablet Take 10 mg by mouth daily as needed for allergies. AM   metoprolol  succinate (TOPROL -XL) 25 MG 24 hr tablet Take 1 tablet (25 mg total) by mouth daily.   polyethylene glycol (MIRALAX  / GLYCOLAX ) 17 g packet Take 17 g by mouth daily. Skip the dose if no constipation    simvastatin  (ZOCOR ) 20 MG tablet Take 20 mg by mouth every evening. PM   sodium bicarbonate  650 MG tablet Take 1 tablet (650 mg total) by mouth 3 (three) times daily.   valsartan (DIOVAN) 80 MG tablet Take 40 mg by mouth daily.   Vitamin D , Ergocalciferol , (DRISDOL ) 1.25 MG (50000 UNIT) CAPS capsule Take 50,000 Units by mouth every 7 (seven) days.   Wheat Dextrin (BENEFIBER ON THE GO) PACK Take 1 packet by mouth daily as needed.   Zinc  Oxide (TRIPLE PASTE) 12.8 % ointment Apply 1 Application topically. To buttocks every shift.   [DISCONTINUED] triamcinolone  cream (KENALOG ) 0.1 % Apply 1 Application topically 2 (two) times daily.   No facility-administered encounter medications on file as of 05/14/2023.    Review of Systems  Immunization History  Administered Date(s) Administered   Influenza Inj Mdck Quad Pf 03/26/2016, 02/14/2018, 02/15/2019, 02/25/2021, 03/02/2022   Influenza Split 02/20/2015   Influenza, Seasonal, Injecte, Preservative Fre 04/13/2012, 03/17/2013   Influenza,inj,Quad PF,6+ Mos 03/07/2014, 02/20/2020   Influenza-Unspecified 03/26/2016, 02/04/2017, 02/14/2018, 02/25/2021, 04/12/2023   Moderna Sars-Covid-2 Vaccination 05/24/2019, 06/24/2019, 02/22/2020, 02/26/2022   PNEUMOCOCCAL CONJUGATE-20 04/09/2023   Pneumococcal Conjugate-13 09/01/2013   Pneumococcal Polysaccharide-23 04/13/2012   RSV,unspecified 04/09/2023   Td 08/07/2010   Tdap 08/07/2010   Zoster, Live 09/12/2007   Pertinent  Health Maintenance Due  Topic Date Due   FOOT EXAM  Never done   OPHTHALMOLOGY EXAM  Never done   HEMOGLOBIN A1C  10/11/2023   INFLUENZA VACCINE  Completed   DEXA SCAN  Completed      01/09/2022   10:55 AM 02/18/2022    1:36 PM 03/18/2022    4:19 PM 03/18/2022    4:46 PM 05/13/2022   10:17 AM  Fall Risk  (RETIRED) Patient Fall Risk Level High fall risk High fall risk Moderate fall risk Moderate fall risk High fall risk   Functional Status Survey:    Vitals:   05/14/23 0901  05/14/23 0911  BP: (!) 157/78 (!) 146/70  Pulse: 74   Resp: 17   Temp: 97.8 F (36.6 C)   SpO2: 97%   Weight: 140 lb 12.8 oz (63.9 kg)   Height: 5' 2 (1.575 m)    Body mass index is 25.75 kg/m. Physical Exam Constitutional:      Comments: Thin, frail  HENT:     Head: Normocephalic and atraumatic.     Mouth/Throat:     Mouth: Mucous membranes are moist.  Eyes:     Pupils: Pupils are equal, round, and reactive to light.  Cardiovascular:     Rate and Rhythm: Normal rate.     Pulses: Normal pulses.  Pulmonary:     Effort: Pulmonary effort is normal.  Abdominal:     General: Abdomen is flat.     Palpations: Abdomen is soft.  Musculoskeletal:     Comments: LUE and LLE 4/5 strength, left leg brace in place  Skin:    General: Skin is warm.  Neurological:     Mental Status: She is alert and oriented to person, place, and time.    Labs reviewed: Recent Labs    04/01/23 0519 04/12/23 0000 05/08/23 0402 05/09/23 0436 05/10/23 0210 05/11/23 0515  NA 138   < > 136 135 133* 134*  K 4.4   < > 3.7 3.8 4.0 3.9  CL 107   < > 108 106 105 109  CO2 21*   < > 19* 18* 19* 17*  GLUCOSE 156*   < > 136* 119* 137* 137*  BUN 39*   < > 31* 34* 39* 45*  CREATININE 1.58*   < > 1.31* 1.47* 1.56* 1.46*  CALCIUM  8.5*   < > 8.9 8.8* 8.6* 8.8*  MG 2.4  --  1.7 2.0  --   --   PHOS 3.0  --  3.3 3.4 2.9  --    < > = values in this interval not displayed.   Recent Labs    11/02/22 1942 11/17/22 1304 02/22/23 1444 05/08/23 0402 05/09/23 0436 05/10/23 0210  AST 25 27 24   --   --   --   ALT 15 17 14   --   --   --   ALKPHOS 64 81 62  --   --   --  BILITOT 0.5 0.6 0.4  --   --   --   PROT 7.8 7.8 7.5  --   --   --   ALBUMIN  3.9 4.2 4.1 3.6 3.5 3.5   Recent Labs    11/17/22 1304 02/22/23 1444 03/29/23 0744 05/06/23 1051 05/07/23 0614 05/09/23 0436 05/10/23 0210 05/11/23 0515  WBC 7.3 6.9   < > 7.7   < > 7.0 11.9* 7.5  NEUTROABS 4.0 3.1  --  4.8  --   --   --   --   HGB 11.2*  11.0*   < > 11.1*   < > 10.9* 10.4* 10.6*  HCT 34.5* 33.9*   < > 33.2*   < > 31.0* 30.0* 30.8*  MCV 91.8 92.6   < > 94.1   < > 92.3 90.4 91.9  PLT 191 233   < > 218   < > 196 190 199   < > = values in this interval not displayed.   Lab Results  Component Value Date   TSH 2.573 08/26/2020   Lab Results  Component Value Date   HGBA1C 7.3 04/12/2023   Lab Results  Component Value Date   CHOL 75 08/27/2022   HDL 22 (L) 08/27/2022   LDLCALC 34 08/27/2022   TRIG 146 05/08/2023   CHOLHDL 3.4 08/27/2022    Significant Diagnostic Results in last 30 days:  ECHOCARDIOGRAM COMPLETE Result Date: 05/07/2023    ECHOCARDIOGRAM REPORT   Patient Name:   KATIELYNN HORAN Date of Exam: 05/07/2023 Medical Rec #:  [id number]     Height:       62.0 in Accession #:    [id number]    Weight:       138.2 lb Date of Birth:  1926-08-25    BSA:          1.634 m Patient Age:    89 years      BP:           142/63 mmHg Patient Gender: F             HR:           94 bpm. Exam Location:  [facility] Procedure: 2D Echo, Cardiac Doppler and Color Doppler Indications:     Stroke  History:         Patient has prior history of Echocardiogram examinations, most                  recent 08/27/2022. CHF, Previous Myocardial Infarction, Stroke;                  Signs/Symptoms:Chest Pain, Dizziness/Lightheadedness and                  Fatigue. CKD, breast CA.  Sonographer:     Naomie Reef Referring Phys:  [id number] LONELL KANDICE MOOSE Diagnosing Phys: Evalene Lunger MD  Sonographer Comments: Technically difficult study due to poor echo windows and suboptimal parasternal window. IMPRESSIONS  1. Left ventricular ejection fraction, by estimation, is 60 to 65%. The left ventricle has normal function. The left ventricle has no regional wall motion abnormalities. Left ventricular diastolic parameters are consistent with Grade I diastolic dysfunction (impaired relaxation).  2. Right ventricular systolic function is normal. The right ventricular size is  normal. There is mildly elevated pulmonary artery systolic pressure. The estimated right ventricular systolic pressure is 41.9 mmHg.  3. The mitral valve is normal in structure. No evidence of mitral valve regurgitation. No evidence  of mitral stenosis.  4. The aortic valve is normal in structure. There is mild calcification of the aortic valve. Aortic valve regurgitation is mild. Aortic valve sclerosis/calcification is present, without any evidence of aortic stenosis. Aortic valve mean gradient measures 8.0 mmHg.  5. The inferior vena cava is normal in size with greater than 50% respiratory variability, suggesting right atrial pressure of 3 mmHg. FINDINGS  Left Ventricle: Left ventricular ejection fraction, by estimation, is 60 to 65%. The left ventricle has normal function. The left ventricle has no regional wall motion abnormalities. The left ventricular internal cavity size was normal in size. There is  no left ventricular hypertrophy. Left ventricular diastolic parameters are consistent with Grade I diastolic dysfunction (impaired relaxation). Right Ventricle: The right ventricular size is normal. No increase in right ventricular wall thickness. Right ventricular systolic function is normal. There is mildly elevated pulmonary artery systolic pressure. The tricuspid regurgitant velocity is 2.91  m/s, and with an assumed right atrial pressure of 8 mmHg, the estimated right ventricular systolic pressure is 41.9 mmHg. Left Atrium: Left atrial size was normal in size. Right Atrium: Right atrial size was normal in size. Pericardium: There is no evidence of pericardial effusion. Mitral Valve: The mitral valve is normal in structure. There is mild calcification of the mitral valve leaflet(s). No evidence of mitral valve regurgitation. No evidence of mitral valve stenosis. MV peak gradient, 5.8 mmHg. The mean mitral valve gradient  is 3.0 mmHg. Tricuspid Valve: The tricuspid valve is normal in structure. Tricuspid valve  regurgitation is mild . No evidence of tricuspid stenosis. Aortic Valve: The aortic valve is normal in structure. There is mild calcification of the aortic valve. Aortic valve regurgitation is mild. Aortic valve sclerosis/calcification is present, without any evidence of aortic stenosis. Aortic valve mean gradient measures 8.0 mmHg. Aortic valve peak gradient measures 17.1 mmHg. Aortic valve area, by VTI measures 2.43 cm. Pulmonic Valve: The pulmonic valve was normal in structure. Pulmonic valve regurgitation is not visualized. No evidence of pulmonic stenosis. Aorta: The aortic root is normal in size and structure. Venous: The inferior vena cava is normal in size with greater than 50% respiratory variability, suggesting right atrial pressure of 3 mmHg. IAS/Shunts: No atrial level shunt detected by color flow Doppler.  LEFT VENTRICLE PLAX 2D LVIDd:         3.00 cm   Diastology LVIDs:         1.90 cm   LV e' medial:    5.55 cm/s LV PW:         0.80 cm   LV E/e' medial:  17.3 LV IVS:        1.40 cm   LV e' lateral:   5.98 cm/s LVOT diam:     1.90 cm   LV E/e' lateral: 16.0 LV SV:         87 LV SV Index:   53 LVOT Area:     2.84 cm  RIGHT VENTRICLE RV Basal diam:  3.00 cm RV Mid diam:    2.20 cm RV S prime:     20.40 cm/s TAPSE (M-mode): 2.6 cm LEFT ATRIUM             Index        RIGHT ATRIUM           Index LA diam:        2.90 cm 1.77 cm/m   RA Area:     11.90 cm LA Vol (A2C):  51.4 ml 31.46 ml/m  RA Volume:   23.70 ml  14.50 ml/m LA Vol (A4C):   36.5 ml 22.34 ml/m LA Biplane Vol: 45.8 ml 28.03 ml/m  AORTIC VALVE AV Area (Vmax):    2.08 cm AV Area (Vmean):   2.03 cm AV Area (VTI):     2.43 cm AV Vmax:           207.00 cm/s AV Vmean:          132.000 cm/s AV VTI:            0.357 m AV Peak Grad:      17.1 mmHg AV Mean Grad:      8.0 mmHg LVOT Vmax:         152.00 cm/s LVOT Vmean:        94.700 cm/s LVOT VTI:          0.306 m LVOT/AV VTI ratio: 0.86  AORTA Ao Root diam: 3.10 cm MITRAL VALVE                 TRICUSPID VALVE MV Area (PHT): 3.23 cm     TR Peak grad:   33.9 mmHg MV Area VTI:   3.34 cm     TR Vmax:        291.00 cm/s MV Peak grad:  5.8 mmHg MV Mean grad:  3.0 mmHg     SHUNTS MV Vmax:       1.20 m/s     Systemic VTI:  0.31 m MV Vmean:      78.9 cm/s    Systemic Diam: 1.90 cm MV Decel Time: 235 msec MV E velocity: 95.90 cm/s MV A velocity: 134.00 cm/s MV E/A ratio:  0.72 Evalene Lunger MD Electronically signed by Evalene Lunger MD Signature Date/Time: 05/07/2023/3:01:11 PM    Final    CT HEAD WO CONTRAST ( ) Result Date: 05/06/2023 CLINICAL DATA:  Subdural hematoma SDH expansion evaluation EXAM: CT HEAD WITHOUT CONTRAST TECHNIQUE: Contiguous axial images were obtained from the base of the skull through the vertex without intravenous contrast. RADIATION DOSE REDUCTION: This exam was performed according to the departmental dose-optimization program which includes automated exposure control, adjustment of the mA and/or kV according to patient size and/or use of iterative reconstruction technique. COMPARISON:  Same-day head CT FINDINGS: Brain: No significant change in size of the mixed density right cerebral convexity subdural hematoma measuring up to 2.9 cm in thickness. Unchanged 6 mm leftward midline shift. No new sites of hemorrhage. No CT evidence of an acute cortical infarct. No hydrocephalus. Unchanged 2 mm hyperdense focus along the septum pellucidum Vascular: No hyperdense vessel or unexpected calcification. Skull: Normal. Negative for fracture or focal lesion. Sinuses/Orbits: Small right mastoid effusion. No middle ear effusion. Paranasal sinuses are clear. Bilateral lens replacement. Orbits are otherwise unremarkable. Other: None. IMPRESSION: 1. No significant change in size of the mixed density right cerebral convexity subdural hematoma measuring up to 2.9 cm in thickness. Unchanged 6 mm leftward midline shift. 2. Unchanged 2 mm hyperdense focus along the septum pellucidum. Electronically  Signed   By: Lyndall Gore M.D.   On: 05/06/2023 17:08   CT Head Wo Contrast Result Date: 05/06/2023 CLINICAL DATA:  Provided history: Head trauma, minor. Neck trauma. Additional history provided: Fall. EXAM: CT HEAD WITHOUT CONTRAST CT CERVICAL SPINE WITHOUT CONTRAST TECHNIQUE: Multidetector CT imaging of the head and cervical spine was performed following the standard protocol without intravenous contrast. Multiplanar CT image reconstructions of the cervical spine were also  generated. RADIATION DOSE REDUCTION: This exam was performed according to the departmental dose-optimization program which includes automated exposure control, adjustment of the mA and/or kV according to patient size and/or use of iterative reconstruction technique. COMPARISON:  Head CT 03/29/2023.  Cervical spine CT 03/29/2023. FINDINGS: CT HEAD FINDINGS Brain: Generalized cerebral atrophy. Mixed density subdural hematoma overlying the right cerebral hemisphere, measuring 2 cm in thickness. A significant portion of the hematoma is comprised of hyperdense acute components. There is mass effect upon the underlying right cerebral hemisphere with 5 mm leftward midline shift. 3 mm hyperdense focus along the septum pellucidum, new from the prior head CT of 03/29/2023. This could potentially reflect a small amount of hemorrhage within the septum pellucidum or within the left lateral ventricle (series 2, image 16) (series 4, image 33). Sequelae of mild chronic small vessel ischemic disease. Prominent perivascular space within the left basal ganglia inferiorly. No demarcated cortical infarct. No evidence of an intracranial mass. Vascular: No hyperdense vessel.  Atherosclerotic calcifications. Skull: No calvarial fracture or aggressive osseous lesion. Sinuses/Orbits: No mass or acute finding within the imaged orbits. No significant paranasal sinus disease at the imaged levels. CT CERVICAL SPINE FINDINGS Alignment: 2 mm grade 1 anterolisthesis at  C7-T1, T1-T2 and T2-T3. Skull base and vertebrae: The basion-dental and atlanto-dental intervals are maintained.No evidence of acute fracture to the cervical spine. 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 Soft tissues and spinal canal: No prevertebral fluid or swelling. No visible canal hematoma. Disc levels: Cervical spondylosis with multilevel disc space narrowing, disc bulges/central disc protrusions, posterior disc osteophyte complexes, uncovertebral hypertrophy and facet arthrosis. Disc space narrowing is greatest at C3-C4 (advanced), C5-C6 (moderate-to-advanced), C6-C7 (moderate-to-advanced) and T2-T3 (moderate-to-advanced). No appreciable high-grade spinal canal stenosis. Multilevel bony neural foraminal narrowing. Upper chest: No acute finding. CT head results were called by telephone at the time of interpretation on 05/06/2023 at 11:48 am to provider [facility] , who verbally acknowledged these results. IMPRESSION: CT head: 1. Mixed density subdural hematoma overlying the right cerebral hemisphere, measuring 2 cm in thickness. A significant portion of the hematoma is comprised of acute components. Mass effect upon the underlying right cerebral hemisphere with 5 mm leftward midline shift. 2. 3 mm hyperdense focus along the septum pellucidum, which could potentially reflect a small amount of hemorrhage within the septum pellucidum or within the left lateral ventricle. Attention recommended on imaging follow-up. CT cervical spine: 1. No evidence of an acute cervical spine fracture. 2. Mild grade 1 anterolisthesis at C7-T1, T1-T2 and T2-T3, unchanged from the prior cervical spine CT of 03/29/2023. 3. Cervical spondylosis as described. 4. 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 Electronically Signed   By: Rockey Childs D.O.   On: 05/06/2023 12:02   CT Cervical Spine Wo Contrast Result Date: 05/06/2023 CLINICAL DATA:  Provided history: Head trauma, minor. Neck trauma. Additional history provided: Fall. EXAM: CT  HEAD WITHOUT CONTRAST CT CERVICAL SPINE WITHOUT CONTRAST TECHNIQUE: Multidetector CT imaging of the head and cervical spine was performed following the standard protocol without intravenous contrast. Multiplanar CT image reconstructions of the cervical spine were also generated. RADIATION DOSE REDUCTION: This exam was performed according to the departmental dose-optimization program which includes automated exposure control, adjustment of the mA and/or kV according to patient size and/or use of iterative reconstruction technique. COMPARISON:  Head CT 03/29/2023.  Cervical spine CT 03/29/2023. FINDINGS: CT HEAD FINDINGS Brain: Generalized cerebral atrophy. Mixed density subdural hematoma overlying the right cerebral hemisphere, measuring 2 cm in thickness.  A significant portion of the hematoma is comprised of hyperdense acute components. There is mass effect upon the underlying right cerebral hemisphere with 5 mm leftward midline shift. 3 mm hyperdense focus along the septum pellucidum, new from the prior head CT of 03/29/2023. This could potentially reflect a small amount of hemorrhage within the septum pellucidum or within the left lateral ventricle (series 2, image 16) (series 4, image 33). Sequelae of mild chronic small vessel ischemic disease. Prominent perivascular space within the left basal ganglia inferiorly. No demarcated cortical infarct. No evidence of an intracranial mass. Vascular: No hyperdense vessel.  Atherosclerotic calcifications. Skull: No calvarial fracture or aggressive osseous lesion. Sinuses/Orbits: No mass or acute finding within the imaged orbits. No significant paranasal sinus disease at the imaged levels. CT CERVICAL SPINE FINDINGS Alignment: 2 mm grade 1 anterolisthesis at C7-T1, T1-T2 and T2-T3. Skull base and vertebrae: The basion-dental and atlanto-dental intervals are maintained.No evidence of acute fracture to the cervical spine. 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 Soft tissues and  spinal canal: No prevertebral fluid or swelling. No visible canal hematoma. Disc levels: Cervical spondylosis with multilevel disc space narrowing, disc bulges/central disc protrusions, posterior disc osteophyte complexes, uncovertebral hypertrophy and facet arthrosis. Disc space narrowing is greatest at C3-C4 (advanced), C5-C6 (moderate-to-advanced), C6-C7 (moderate-to-advanced) and T2-T3 (moderate-to-advanced). No appreciable high-grade spinal canal stenosis. Multilevel bony neural foraminal narrowing. Upper chest: No acute finding. CT head results were called by telephone at the time of interpretation on 05/06/2023 at 11:48 am to provider [facility] , who verbally acknowledged these results. IMPRESSION: CT head: 1. Mixed density subdural hematoma overlying the right cerebral hemisphere, measuring 2 cm in thickness. A significant portion of the hematoma is comprised of acute components. Mass effect upon the underlying right cerebral hemisphere with 5 mm leftward midline shift. 2. 3 mm hyperdense focus along the septum pellucidum, which could potentially reflect a small amount of hemorrhage within the septum pellucidum or within the left lateral ventricle. Attention recommended on imaging follow-up. CT cervical spine: 1. No evidence of an acute cervical spine fracture. 2. Mild grade 1 anterolisthesis at C7-T1, T1-T2 and T2-T3, unchanged from the prior cervical spine CT of 03/29/2023. 3. Cervical spondylosis as described. 4. 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 Electronically Signed   By: Rockey Childs D.O.   On: 05/06/2023 12:02   CT PELVIS WO CONTRAST Result Date: 05/06/2023 CLINICAL DATA:  Hip trauma, fracture suspected bruising of left leg EXAM: CT PELVIS WITHOUT CONTRAST TECHNIQUE: Multidetector CT imaging of the pelvis was performed following the standard protocol without intravenous contrast. RADIATION DOSE REDUCTION: This exam was performed according to the departmental dose-optimization program which  includes automated exposure control, adjustment of the mA and/or kV according to patient size and/or use of iterative reconstruction technique. COMPARISON:  Radiograph 05/06/2023 and CT 08/22/2022 FINDINGS: Urinary Tract:  No acute abnormality. Bowel:  No acute abnormality. Vascular/Lymphatic: Arterial atherosclerotic calcification. 40 mm infrarenal abdominal aortic aneurysm. No lymphadenopathy. Reproductive:  Hysterectomy. Other:  No free intraperitoneal fluid or air. Musculoskeletal: Demineralization. No acute fracture or dislocation. IMPRESSION: 1. No acute fracture or dislocation. 2. 40 mm infrarenal abdominal aortic aneurysm. Recommend follow-up CT or MR as appropriate in 12 months and referral to or continued care with vascular specialist. (Ref.: J Vasc Surg. 2018; 67:2-77 and J Am Coll Radiol 2013;10(10):789-794.) Electronically Signed   By: Norman Gatlin M.D.   On: 05/06/2023 11:41   DG FEMUR MIN 2 VIEWS LEFT Result Date: 05/06/2023 CLINICAL DATA:  Fall, pain. EXAM: LEFT FEMUR 2 VIEWS COMPARISON:  None Available. FINDINGS: There is no evidence of fracture or other focal bone lesions. Soft tissues are unremarkable. IMPRESSION: Negative. Electronically Signed   By: Fonda Field M.D.   On: 05/06/2023 11:23   DG Pelvis 1-2 Views Result Date: 05/06/2023 CLINICAL DATA:  Fall, pain. EXAM: PELVIS - 1 VIEW COMPARISON:  None Available. FINDINGS: There is no evidence of pelvic fracture or diastasis. No pelvic bone lesions are seen. Lumbosacral degenerative changes. Bilateral sacroiliac osteophytes. Right-sided hip degenerative joint disease. IMPRESSION: Degenerative changes. Electronically Signed   By: Fonda Field M.D.   On: 05/06/2023 11:22   DG Knee Complete 4 Views Left Result Date: 05/06/2023 CLINICAL DATA:  injury.  Fall.  Painful to stand. EXAM: LEFT KNEE - COMPLETE 4+ VIEW COMPARISON:  None Available. FINDINGS: No acute fracture or dislocation. No aggressive osseous lesion. There are  moderate degenerative changes of the knee joint. Meniscal chondrocalcinosis noted. No  focal soft tissue swelling. No radiopaque foreign bodies. IMPRESSION: *No acute osseous abnormality of the left knee joint. Moderate degenerative osteoarthritis. Electronically Signed   By: Ree Molt M.D.   On: 05/06/2023 10:17   Assessment and Plan:  Traumatic subdural hematoma without loss of consciousness, subsequent encounter  Chronic kidney disease, stage 4 (severe) (HCC), Chronic  Cancer, metastatic to bone (HCC), Chronic  Chronic diastolic CHF (congestive heart failure) (HCC), Chronic  Pulmonary emphysema, unspecified emphysema type (HCC), Chronic  Temporal arteritis (HCC), Chronic  Type 2 diabetes mellitus with stage 3b chronic kidney disease, without long-term current use of insulin  (HCC), Chronic  Subarachnoid hemorrhage (HCC)  Benign essential hypertension  Left-sided weakness  Recurrent falls  Episode of recurrent major depressive disorder, unspecified depression episode severity (HCC) Patient admitted after second fall in the last 2 months with acute on chronic worsening of subdural hematoma. Given patient's increased risk for falling and bleed, will defer restarting anticoagulation until neurosurgery follow up. Since this fall has had worsened left sided weakness. Discussed concern that it is due to underlying bleed. She is working with physical therapy. Discussed concern that she cannot return to her home in [place], however, will need further discussion after she has had some time working with therapy. Patient's falls becoming more frequent and injurious. Multifactorial including, age, neuropathy, polypharmacy, and left sided weakness. Dose reduction to medications as tolerated. Appears euvolemic on exam. Avoid nephrotoxic medications. BP slightly above goal today, will continue with TID bp checks and if persistently elevated, will adjust medications.    Family/ staff Communication:  Nursing, Daughter  Labs/tests ordered:  CBC, BMP   I spent greater than 60 minutes for the care of this patient in face to face time, chart review, clinical documentation, patient education.

## 2023-05-17 NOTE — Telephone Encounter (Signed)
 CT 06/10/2023 Dr.Yarbrough on 06/17/2023

## 2023-05-20 LAB — CBC AND DIFFERENTIAL
HCT: 33 — AB (ref 36–46)
Hemoglobin: 10.7 — AB (ref 12.0–16.0)
Neutrophils Absolute: 5220
Platelets: 249 10*3/uL (ref 150–400)
WBC: 8.7

## 2023-05-20 LAB — BASIC METABOLIC PANEL
BUN: 28 — AB (ref 4–21)
CO2: 25 — AB (ref 13–22)
Chloride: 105 (ref 99–108)
Creatinine: 1.2 — AB (ref 0.5–1.1)
Glucose: 131
Potassium: 4.6 meq/L (ref 3.5–5.1)
Sodium: 140 (ref 137–147)

## 2023-05-20 LAB — CBC: RBC: 3.44 — AB (ref 3.87–5.11)

## 2023-05-20 LAB — COMPREHENSIVE METABOLIC PANEL
Calcium: 9.6 (ref 8.7–10.7)
eGFR: 41

## 2023-05-25 ENCOUNTER — Non-Acute Institutional Stay (SKILLED_NURSING_FACILITY): Payer: Self-pay | Admitting: Nurse Practitioner

## 2023-05-25 ENCOUNTER — Encounter: Payer: Self-pay | Admitting: Nurse Practitioner

## 2023-05-25 ENCOUNTER — Inpatient Hospital Stay: Payer: Medicare PPO | Attending: Internal Medicine

## 2023-05-25 ENCOUNTER — Inpatient Hospital Stay: Payer: Medicare PPO | Admitting: Internal Medicine

## 2023-05-25 DIAGNOSIS — N1832 Chronic kidney disease, stage 3b: Secondary | ICD-10-CM | POA: Diagnosis not present

## 2023-05-25 DIAGNOSIS — I10 Essential (primary) hypertension: Secondary | ICD-10-CM

## 2023-05-25 DIAGNOSIS — R490 Dysphonia: Secondary | ICD-10-CM | POA: Diagnosis not present

## 2023-05-25 NOTE — Progress Notes (Signed)
 Location:  Other [place].  Nursing Home Room Number: [facility] [address]A Place of Service:  SNF ([phone]) Harlene An, NP  PCP: Diedra Lame, MD  Patient Care Team: Diedra Lame, MD as PCP - General (Family Medicine) Rennie Cindy SAUNDERS, MD as Consulting Physician (Internal Medicine) Dessa Reyes ORN, MD as Consulting Physician (General Surgery) Dannielle Arlean FALCON, RN (Inactive) as Oncology Nurse Navigator Lenn Aran, MD as Referring Physician (Radiation Oncology)  Extended Emergency Contact Information Primary Emergency Contact: [facility] Phone: [phone] Relation: Daughter Secondary Emergency Contact: Rider,Kimberly Mobile Phone: [phone] Relation: Daughter  Goals of care: Advanced Directive information    05/25/2023    1:06 PM  Advanced Directives  Does Patient Have a Medical Advance Directive? Yes  Type of Advance Directive Out of facility DNR (pink MOST or yellow form)  Does patient want to make changes to medical advance directive? No - Patient declined     Chief Complaint  Patient presents with   Acute Visit    Blood Pressure.     HPI:  Pt is a 89 y.o. female seen today for an acute visit for Blood Pressure.  With hx of CKD, multiple falls resulting in subdural hematoma, CHF.  She is requesting visit due to ongoing elevation in her blood pressure.  She is currently on valsartan, hydralazine , metoprolol   Blood pressures ranging 140-160s typically over 70s-80s She denies chest pains or palpitations Reports headaches  States she is also have more hoariness and burping, denies GERD/indigestion at this time  Past Medical History:  Diagnosis Date   Arthritis    Gout   Chronic kidney disease    Diarrhea    Hypercholesteremia    Hypertension    Neuropathy    feet and lower legs   Seasonal allergies    Skin cancer    face   Past Surgical History:  Procedure Laterality Date   ABDOMINAL HYSTERECTOMY  2000   APPENDECTOMY      BREAST BIOPSY Right 09/19/2019   affirm bx of calcs UOQ, x marker, path pending   BREAST BIOPSY Right 09/19/2019   us  bx of mass,heart marker, path pending   BREAST BIOPSY Right 09/19/2019   us  bx of LN, coil marker, path pending   CATARACT EXTRACTION Left    CATARACT EXTRACTION W/PHACO Right 10/24/2014   Procedure: CATARACT EXTRACTION PHACO AND INTRAOCULAR LENS PLACEMENT (IOC);  Surgeon: Dene Etienne, MD;  Location: [facility];  Service: Ophthalmology;  Laterality: Right;   ESOPHAGOGASTRODUODENOSCOPY N/A 03/07/2018   Procedure: ESOPHAGOGASTRODUODENOSCOPY (EGD);  Surgeon: Unk Corinn Skiff, MD;  Location: [facility] ENDOSCOPY;  Service: Gastroenterology;  Laterality: N/A;   MASTECTOMY MODIFIED RADICAL Right 10/13/2019   Procedure: MASTECTOMY MODIFIED RADICAL;  Surgeon: Dessa Reyes ORN, MD;  Location: [facility] ORS;  Service: General;  Laterality: Right;   RENAL ANGIOGRAPHY Right 04/11/2018   Procedure: RENAL ANGIOGRAPHY;  Surgeon: Marea Selinda RAMAN, MD;  Location: [facility] INVASIVE CV LAB;  Service: Cardiovascular;  Laterality: Right;   SIMPLE MASTECTOMY WITH AXILLARY SENTINEL NODE BIOPSY Left 10/13/2019   Procedure: SIMPLE MASTECTOMY TRUE CUT BIOPSY, SENTINEL NODE BIOPSY;  Surgeon: Dessa Reyes ORN, MD;  Location: [facility] ORS;  Service: General;  Laterality: Left;    Allergies  Allergen Reactions   Penicillins Anaphylaxis   Drug Ingredient [Zinc ]    Empagliflozin  Rash    Outpatient Encounter Medications as of 05/25/2023  Medication Sig   acetaminophen  (TYLENOL ) 325 MG tablet Take 650 mg by mouth 3 (three) times daily.   acetaminophen  (TYLENOL ) 500 MG  tablet Take 500 mg by mouth every 8 (eight) hours as needed.   amLODipine  (NORVASC ) 5 MG tablet Take 1 tablet (5 mg total) by mouth every evening. Skip the dose if SBP less than 140 mmHg   anastrozole  (ARIMIDEX ) 1 MG tablet Take 1 tablet (1 mg total) by mouth daily.   calcitRIOL (ROCALTROL) 0.25 MCG capsule Take 0.25 mcg by mouth as directed.  Monday,Wednesday, Friday   calcium  carbonate (OS-CAL - DOSED IN MG OF ELEMENTAL CALCIUM ) 1250 (500 Ca) MG tablet Take 2 tablets by mouth daily with breakfast.   citalopram  (CELEXA ) 10 MG tablet Take 10 mg by mouth daily.    cyanocobalamin  (VITAMIN B12) 1000 MCG tablet Take 1,000 mcg by mouth daily.   diclofenac  Sodium (VOLTAREN ) 1 % GEL Apply 2 g topically every 6 (six) hours. To bilateral legs.   docusate sodium  (COLACE) 100 MG capsule Take 100 mg by mouth daily as needed for mild constipation.   DULoxetine  (CYMBALTA ) 60 MG capsule Take 60 mg by mouth daily.   gabapentin  (NEURONTIN ) 300 MG capsule Take 300 mg by mouth 2 (two) times daily.   hydrALAZINE  (APRESOLINE ) 50 MG tablet Take 1 tablet (50 mg total) by mouth 3 (three) times daily. Skip the dose if SBP less than 130 mmHg   loratadine  (CLARITIN ) 10 MG tablet Take 10 mg by mouth daily as needed for allergies. AM   melatonin 3 MG TABS tablet Take 3 mg by mouth at bedtime.   metoprolol  succinate (TOPROL -XL) 25 MG 24 hr tablet Take 1 tablet (25 mg total) by mouth daily.   polyethylene glycol (MIRALAX  / GLYCOLAX ) 17 g packet Take 17 g by mouth daily. Skip the dose if no constipation   simvastatin  (ZOCOR ) 20 MG tablet Take 20 mg by mouth every evening. PM   sodium bicarbonate  650 MG tablet Take 1 tablet (650 mg total) by mouth 3 (three) times daily.   valsartan (DIOVAN) 80 MG tablet Take 40 mg by mouth daily.   Vitamin D , Ergocalciferol , (DRISDOL ) 1.25 MG (50000 UNIT) CAPS capsule Take 50,000 Units by mouth every 7 (seven) days.   Wheat Dextrin (BENEFIBER ON THE GO) PACK Take 1 packet by mouth daily as needed.   Zinc  Oxide (TRIPLE PASTE) 12.8 % ointment Apply 1 Application topically. To buttocks every shift.   No facility-administered encounter medications on file as of 05/25/2023.    Review of Systems  Constitutional:  Negative for activity change, appetite change, fatigue and unexpected weight change.  HENT:  Negative for congestion and  hearing loss.   Eyes: Negative.   Respiratory:  Negative for cough and shortness of breath.   Cardiovascular:  Negative for chest pain, palpitations and leg swelling.  Gastrointestinal:  Negative for abdominal pain, constipation and diarrhea.  Genitourinary:  Negative for difficulty urinating and dysuria.  Musculoskeletal:  Negative for arthralgias and myalgias.  Skin:  Negative for color change and wound.  Neurological:  Positive for dizziness (occasionally), weakness and headaches.  Psychiatric/Behavioral:  Negative for agitation, behavioral problems and confusion.     Immunization History  Administered Date(s) Administered   Influenza Inj Mdck Quad Pf 03/26/2016, 02/14/2018, 02/15/2019, 02/25/2021, 03/02/2022   Influenza Split 02/20/2015   Influenza, Seasonal, Injecte, Preservative Fre 04/13/2012, 03/17/2013   Influenza,inj,Quad PF,6+ Mos 03/07/2014, 02/20/2020   Influenza-Unspecified 03/26/2016, 02/04/2017, 02/14/2018, 02/25/2021, 04/12/2023   Moderna Sars-Covid-2 Vaccination 05/24/2019, 06/24/2019, 02/22/2020, 02/26/2022   PNEUMOCOCCAL CONJUGATE-20 04/09/2023   Pneumococcal Conjugate-13 09/01/2013   Pneumococcal Polysaccharide-23 04/13/2012   RSV,unspecified 04/09/2023   Td  08/07/2010   Tdap 08/07/2010   Zoster, Live 09/12/2007   Pertinent  Health Maintenance Due  Topic Date Due   FOOT EXAM  Never done   OPHTHALMOLOGY EXAM  Never done   HEMOGLOBIN A1C  10/11/2023   INFLUENZA VACCINE  Completed   DEXA SCAN  Completed      01/09/2022   10:55 AM 02/18/2022    1:36 PM 03/18/2022    4:19 PM 03/18/2022    4:46 PM 05/13/2022   10:17 AM  Fall Risk  (RETIRED) Patient Fall Risk Level High fall risk High fall risk Moderate fall risk Moderate fall risk High fall risk   Functional Status Survey:    Vitals:   05/25/23 1300 05/25/23 1311  BP: (!) 164/82 (!) 152/80  Pulse: 79   Resp: 18   Temp: 97.7 F (36.5 C)   SpO2: 96%   Weight: 140 lb 12.8 oz (63.9 kg)   Height: 5' 2  (1.575 m)    Body mass index is 25.75 kg/m. Physical Exam Constitutional:      General: She is not in acute distress.    Appearance: She is well-developed. She is not diaphoretic.  HENT:     Head: Normocephalic and atraumatic.     Mouth/Throat:     Pharynx: No oropharyngeal exudate.  Eyes:     Conjunctiva/sclera: Conjunctivae normal.     Pupils: Pupils are equal, round, and reactive to light.  Cardiovascular:     Rate and Rhythm: Normal rate and regular rhythm.     Heart sounds: Normal heart sounds.  Pulmonary:     Effort: Pulmonary effort is normal.     Breath sounds: Normal breath sounds.  Abdominal:     General: Bowel sounds are normal.     Palpations: Abdomen is soft.  Musculoskeletal:     Cervical back: Normal range of motion and neck supple.     Right lower leg: No edema.     Left lower leg: No edema.  Skin:    General: Skin is warm and dry.  Neurological:     Mental Status: She is alert.  Psychiatric:        Mood and Affect: Mood normal.     Labs reviewed: Recent Labs    04/01/23 0519 04/12/23 0000 05/08/23 0402 05/09/23 0436 05/10/23 0210 05/11/23 0515 05/20/23 0000  NA 138   < > 136 135 133* 134* 140  K 4.4   < > 3.7 3.8 4.0 3.9 4.6  CL 107   < > 108 106 105 109 105  CO2 21*   < > 19* 18* 19* 17* 25*  GLUCOSE 156*   < > 136* 119* 137* 137*  --   BUN 39*   < > 31* 34* 39* 45* 28*  CREATININE 1.58*   < > 1.31* 1.47* 1.56* 1.46* 1.2*  CALCIUM  8.5*   < > 8.9 8.8* 8.6* 8.8* 9.6  MG 2.4  --  1.7 2.0  --   --   --   PHOS 3.0  --  3.3 3.4 2.9  --   --    < > = values in this interval not displayed.   Recent Labs    11/02/22 1942 11/17/22 1304 02/22/23 1444 05/08/23 0402 05/09/23 0436 05/10/23 0210  AST 25 27 24   --   --   --   ALT 15 17 14   --   --   --   ALKPHOS 64 81 62  --   --   --  BILITOT 0.5 0.6 0.4  --   --   --   PROT 7.8 7.8 7.5  --   --   --   ALBUMIN  3.9 4.2 4.1 3.6 3.5 3.5   Recent Labs    02/22/23 1444 03/29/23 0744  05/06/23 1051 05/07/23 0614 05/09/23 0436 05/10/23 0210 05/11/23 0515 05/20/23 0000  WBC 6.9   < > 7.7   < > 7.0 11.9* 7.5 8.7  NEUTROABS 3.1  --  4.8  --   --   --   --  5,220.00  HGB 11.0*   < > 11.1*   < > 10.9* 10.4* 10.6* 10.7*  HCT 33.9*   < > 33.2*   < > 31.0* 30.0* 30.8* 33*  MCV 92.6   < > 94.1   < > 92.3 90.4 91.9  --   PLT 233   < > 218   < > 196 190 199 249   < > = values in this interval not displayed.   Lab Results  Component Value Date   TSH 2.573 08/26/2020   Lab Results  Component Value Date   HGBA1C 7.3 04/12/2023   Lab Results  Component Value Date   CHOL 75 08/27/2022   HDL 22 (L) 08/27/2022   LDLCALC 34 08/27/2022   TRIG 146 05/08/2023   CHOLHDL 3.4 08/27/2022    Significant Diagnostic Results in last 30 days:  ECHOCARDIOGRAM COMPLETE Result Date: 05/07/2023    ECHOCARDIOGRAM REPORT   Patient Name:   Tina Curtis Date of Exam: 05/07/2023 Medical Rec #:  [id number]     Height:       62.0 in Accession #:    [id number]    Weight:       138.2 lb Date of Birth:  24-Sep-1926    BSA:          1.634 m Patient Age:    89 years      BP:           142/63 mmHg Patient Gender: F             HR:           94 bpm. Exam Location:  [facility] Procedure: 2D Echo, Cardiac Doppler and Color Doppler Indications:     Stroke  History:         Patient has prior history of Echocardiogram examinations, most                  recent 08/27/2022. CHF, Previous Myocardial Infarction, Stroke;                  Signs/Symptoms:Chest Pain, Dizziness/Lightheadedness and                  Fatigue. CKD, breast CA.  Sonographer:     Naomie Reef Referring Phys:  [id number] LONELL KANDICE MOOSE Diagnosing Phys: Evalene Lunger MD  Sonographer Comments: Technically difficult study due to poor echo windows and suboptimal parasternal window. IMPRESSIONS  1. Left ventricular ejection fraction, by estimation, is 60 to 65%. The left ventricle has normal function. The left ventricle has no regional wall motion  abnormalities. Left ventricular diastolic parameters are consistent with Grade I diastolic dysfunction (impaired relaxation).  2. Right ventricular systolic function is normal. The right ventricular size is normal. There is mildly elevated pulmonary artery systolic pressure. The estimated right ventricular systolic pressure is 41.9 mmHg.  3. The mitral valve is normal in structure. No evidence of mitral valve regurgitation.  No evidence of mitral stenosis.  4. The aortic valve is normal in structure. There is mild calcification of the aortic valve. Aortic valve regurgitation is mild. Aortic valve sclerosis/calcification is present, without any evidence of aortic stenosis. Aortic valve mean gradient measures 8.0 mmHg.  5. The inferior vena cava is normal in size with greater than 50% respiratory variability, suggesting right atrial pressure of 3 mmHg. FINDINGS  Left Ventricle: Left ventricular ejection fraction, by estimation, is 60 to 65%. The left ventricle has normal function. The left ventricle has no regional wall motion abnormalities. The left ventricular internal cavity size was normal in size. There is  no left ventricular hypertrophy. Left ventricular diastolic parameters are consistent with Grade I diastolic dysfunction (impaired relaxation). Right Ventricle: The right ventricular size is normal. No increase in right ventricular wall thickness. Right ventricular systolic function is normal. There is mildly elevated pulmonary artery systolic pressure. The tricuspid regurgitant velocity is 2.91  m/s, and with an assumed right atrial pressure of 8 mmHg, the estimated right ventricular systolic pressure is 41.9 mmHg. Left Atrium: Left atrial size was normal in size. Right Atrium: Right atrial size was normal in size. Pericardium: There is no evidence of pericardial effusion. Mitral Valve: The mitral valve is normal in structure. There is mild calcification of the mitral valve leaflet(s). No evidence of mitral  valve regurgitation. No evidence of mitral valve stenosis. MV peak gradient, 5.8 mmHg. The mean mitral valve gradient  is 3.0 mmHg. Tricuspid Valve: The tricuspid valve is normal in structure. Tricuspid valve regurgitation is mild . No evidence of tricuspid stenosis. Aortic Valve: The aortic valve is normal in structure. There is mild calcification of the aortic valve. Aortic valve regurgitation is mild. Aortic valve sclerosis/calcification is present, without any evidence of aortic stenosis. Aortic valve mean gradient measures 8.0 mmHg. Aortic valve peak gradient measures 17.1 mmHg. Aortic valve area, by VTI measures 2.43 cm. Pulmonic Valve: The pulmonic valve was normal in structure. Pulmonic valve regurgitation is not visualized. No evidence of pulmonic stenosis. Aorta: The aortic root is normal in size and structure. Venous: The inferior vena cava is normal in size with greater than 50% respiratory variability, suggesting right atrial pressure of 3 mmHg. IAS/Shunts: No atrial level shunt detected by color flow Doppler.  LEFT VENTRICLE PLAX 2D LVIDd:         3.00 cm   Diastology LVIDs:         1.90 cm   LV e' medial:    5.55 cm/s LV PW:         0.80 cm   LV E/e' medial:  17.3 LV IVS:        1.40 cm   LV e' lateral:   5.98 cm/s LVOT diam:     1.90 cm   LV E/e' lateral: 16.0 LV SV:         87 LV SV Index:   53 LVOT Area:     2.84 cm  RIGHT VENTRICLE RV Basal diam:  3.00 cm RV Mid diam:    2.20 cm RV S prime:     20.40 cm/s TAPSE (M-mode): 2.6 cm LEFT ATRIUM             Index        RIGHT ATRIUM           Index LA diam:        2.90 cm 1.77 cm/m   RA Area:     11.90 cm LA Vol (A2C):  51.4 ml 31.46 ml/m  RA Volume:   23.70 ml  14.50 ml/m LA Vol (A4C):   36.5 ml 22.34 ml/m LA Biplane Vol: 45.8 ml 28.03 ml/m  AORTIC VALVE AV Area (Vmax):    2.08 cm AV Area (Vmean):   2.03 cm AV Area (VTI):     2.43 cm AV Vmax:           207.00 cm/s AV Vmean:          132.000 cm/s AV VTI:            0.357 m AV Peak Grad:       17.1 mmHg AV Mean Grad:      8.0 mmHg LVOT Vmax:         152.00 cm/s LVOT Vmean:        94.700 cm/s LVOT VTI:          0.306 m LVOT/AV VTI ratio: 0.86  AORTA Ao Root diam: 3.10 cm MITRAL VALVE                TRICUSPID VALVE MV Area (PHT): 3.23 cm     TR Peak grad:   33.9 mmHg MV Area VTI:   3.34 cm     TR Vmax:        291.00 cm/s MV Peak grad:  5.8 mmHg MV Mean grad:  3.0 mmHg     SHUNTS MV Vmax:       1.20 m/s     Systemic VTI:  0.31 m MV Vmean:      78.9 cm/s    Systemic Diam: 1.90 cm MV Decel Time: 235 msec MV E velocity: 95.90 cm/s MV A velocity: 134.00 cm/s MV E/A ratio:  0.72 Evalene Lunger MD Electronically signed by Evalene Lunger MD Signature Date/Time: 05/07/2023/3:01:11 PM    Final    CT HEAD WO CONTRAST ( ) Result Date: 05/06/2023 CLINICAL DATA:  Subdural hematoma SDH expansion evaluation EXAM: CT HEAD WITHOUT CONTRAST TECHNIQUE: Contiguous axial images were obtained from the base of the skull through the vertex without intravenous contrast. RADIATION DOSE REDUCTION: This exam was performed according to the departmental dose-optimization program which includes automated exposure control, adjustment of the mA and/or kV according to patient size and/or use of iterative reconstruction technique. COMPARISON:  Same-day head CT FINDINGS: Brain: No significant change in size of the mixed density right cerebral convexity subdural hematoma measuring up to 2.9 cm in thickness. Unchanged 6 mm leftward midline shift. No new sites of hemorrhage. No CT evidence of an acute cortical infarct. No hydrocephalus. Unchanged 2 mm hyperdense focus along the septum pellucidum Vascular: No hyperdense vessel or unexpected calcification. Skull: Normal. Negative for fracture or focal lesion. Sinuses/Orbits: Small right mastoid effusion. No middle ear effusion. Paranasal sinuses are clear. Bilateral lens replacement. Orbits are otherwise unremarkable. Other: None. IMPRESSION: 1. No significant change in size of the mixed  density right cerebral convexity subdural hematoma measuring up to 2.9 cm in thickness. Unchanged 6 mm leftward midline shift. 2. Unchanged 2 mm hyperdense focus along the septum pellucidum. Electronically Signed   By: Lyndall Gore M.D.   On: 05/06/2023 17:08   CT Head Wo Contrast Result Date: 05/06/2023 CLINICAL DATA:  Provided history: Head trauma, minor. Neck trauma. Additional history provided: Fall. EXAM: CT HEAD WITHOUT CONTRAST CT CERVICAL SPINE WITHOUT CONTRAST TECHNIQUE: Multidetector CT imaging of the head and cervical spine was performed following the standard protocol without intravenous contrast. Multiplanar CT image reconstructions of the cervical spine were also  generated. RADIATION DOSE REDUCTION: This exam was performed according to the departmental dose-optimization program which includes automated exposure control, adjustment of the mA and/or kV according to patient size and/or use of iterative reconstruction technique. COMPARISON:  Head CT 03/29/2023.  Cervical spine CT 03/29/2023. FINDINGS: CT HEAD FINDINGS Brain: Generalized cerebral atrophy. Mixed density subdural hematoma overlying the right cerebral hemisphere, measuring 2 cm in thickness. A significant portion of the hematoma is comprised of hyperdense acute components. There is mass effect upon the underlying right cerebral hemisphere with 5 mm leftward midline shift. 3 mm hyperdense focus along the septum pellucidum, new from the prior head CT of 03/29/2023. This could potentially reflect a small amount of hemorrhage within the septum pellucidum or within the left lateral ventricle (series 2, image 16) (series 4, image 33). Sequelae of mild chronic small vessel ischemic disease. Prominent perivascular space within the left basal ganglia inferiorly. No demarcated cortical infarct. No evidence of an intracranial mass. Vascular: No hyperdense vessel.  Atherosclerotic calcifications. Skull: No calvarial fracture or aggressive osseous  lesion. Sinuses/Orbits: No mass or acute finding within the imaged orbits. No significant paranasal sinus disease at the imaged levels. CT CERVICAL SPINE FINDINGS Alignment: 2 mm grade 1 anterolisthesis at C7-T1, T1-T2 and T2-T3. Skull base and vertebrae: The basion-dental and atlanto-dental intervals are maintained.No evidence of acute fracture to the cervical spine. 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 Soft tissues and spinal canal: No prevertebral fluid or swelling. No visible canal hematoma. Disc levels: Cervical spondylosis with multilevel disc space narrowing, disc bulges/central disc protrusions, posterior disc osteophyte complexes, uncovertebral hypertrophy and facet arthrosis. Disc space narrowing is greatest at C3-C4 (advanced), C5-C6 (moderate-to-advanced), C6-C7 (moderate-to-advanced) and T2-T3 (moderate-to-advanced). No appreciable high-grade spinal canal stenosis. Multilevel bony neural foraminal narrowing. Upper chest: No acute finding. CT head results were called by telephone at the time of interpretation on 05/06/2023 at 11:48 am to provider [facility] , who verbally acknowledged these results. IMPRESSION: CT head: 1. Mixed density subdural hematoma overlying the right cerebral hemisphere, measuring 2 cm in thickness. A significant portion of the hematoma is comprised of acute components. Mass effect upon the underlying right cerebral hemisphere with 5 mm leftward midline shift. 2. 3 mm hyperdense focus along the septum pellucidum, which could potentially reflect a small amount of hemorrhage within the septum pellucidum or within the left lateral ventricle. Attention recommended on imaging follow-up. CT cervical spine: 1. No evidence of an acute cervical spine fracture. 2. Mild grade 1 anterolisthesis at C7-T1, T1-T2 and T2-T3, unchanged from the prior cervical spine CT of 03/29/2023. 3. Cervical spondylosis as described. 4. 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 Electronically Signed   By: Rockey Childs D.O.   On: 05/06/2023 12:02   CT Cervical Spine Wo Contrast Result Date: 05/06/2023 CLINICAL DATA:  Provided history: Head trauma, minor. Neck trauma. Additional history provided: Fall. EXAM: CT HEAD WITHOUT CONTRAST CT CERVICAL SPINE WITHOUT CONTRAST TECHNIQUE: Multidetector CT imaging of the head and cervical spine was performed following the standard protocol without intravenous contrast. Multiplanar CT image reconstructions of the cervical spine were also generated. RADIATION DOSE REDUCTION: This exam was performed according to the departmental dose-optimization program which includes automated exposure control, adjustment of the mA and/or kV according to patient size and/or use of iterative reconstruction technique. COMPARISON:  Head CT 03/29/2023.  Cervical spine CT 03/29/2023. FINDINGS: CT HEAD FINDINGS Brain: Generalized cerebral atrophy. Mixed density subdural hematoma overlying the right cerebral hemisphere, measuring 2 cm in thickness.  A significant portion of the hematoma is comprised of hyperdense acute components. There is mass effect upon the underlying right cerebral hemisphere with 5 mm leftward midline shift. 3 mm hyperdense focus along the septum pellucidum, new from the prior head CT of 03/29/2023. This could potentially reflect a small amount of hemorrhage within the septum pellucidum or within the left lateral ventricle (series 2, image 16) (series 4, image 33). Sequelae of mild chronic small vessel ischemic disease. Prominent perivascular space within the left basal ganglia inferiorly. No demarcated cortical infarct. No evidence of an intracranial mass. Vascular: No hyperdense vessel.  Atherosclerotic calcifications. Skull: No calvarial fracture or aggressive osseous lesion. Sinuses/Orbits: No mass or acute finding within the imaged orbits. No significant paranasal sinus disease at the imaged levels. CT CERVICAL SPINE FINDINGS Alignment: 2 mm grade 1 anterolisthesis at C7-T1, T1-T2  and T2-T3. Skull base and vertebrae: The basion-dental and atlanto-dental intervals are maintained.No evidence of acute fracture to the cervical spine. 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 Soft tissues and spinal canal: No prevertebral fluid or swelling. No visible canal hematoma. Disc levels: Cervical spondylosis with multilevel disc space narrowing, disc bulges/central disc protrusions, posterior disc osteophyte complexes, uncovertebral hypertrophy and facet arthrosis. Disc space narrowing is greatest at C3-C4 (advanced), C5-C6 (moderate-to-advanced), C6-C7 (moderate-to-advanced) and T2-T3 (moderate-to-advanced). No appreciable high-grade spinal canal stenosis. Multilevel bony neural foraminal narrowing. Upper chest: No acute finding. CT head results were called by telephone at the time of interpretation on 05/06/2023 at 11:48 am to provider [facility] , who verbally acknowledged these results. IMPRESSION: CT head: 1. Mixed density subdural hematoma overlying the right cerebral hemisphere, measuring 2 cm in thickness. A significant portion of the hematoma is comprised of acute components. Mass effect upon the underlying right cerebral hemisphere with 5 mm leftward midline shift. 2. 3 mm hyperdense focus along the septum pellucidum, which could potentially reflect a small amount of hemorrhage within the septum pellucidum or within the left lateral ventricle. Attention recommended on imaging follow-up. CT cervical spine: 1. No evidence of an acute cervical spine fracture. 2. Mild grade 1 anterolisthesis at C7-T1, T1-T2 and T2-T3, unchanged from the prior cervical spine CT of 03/29/2023. 3. Cervical spondylosis as described. 4. 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 Electronically Signed   By: Rockey Childs D.O.   On: 05/06/2023 12:02   CT PELVIS WO CONTRAST Result Date: 05/06/2023 CLINICAL DATA:  Hip trauma, fracture suspected bruising of left leg EXAM: CT PELVIS WITHOUT CONTRAST TECHNIQUE: Multidetector CT  imaging of the pelvis was performed following the standard protocol without intravenous contrast. RADIATION DOSE REDUCTION: This exam was performed according to the departmental dose-optimization program which includes automated exposure control, adjustment of the mA and/or kV according to patient size and/or use of iterative reconstruction technique. COMPARISON:  Radiograph 05/06/2023 and CT 08/22/2022 FINDINGS: Urinary Tract:  No acute abnormality. Bowel:  No acute abnormality. Vascular/Lymphatic: Arterial atherosclerotic calcification. 40 mm infrarenal abdominal aortic aneurysm. No lymphadenopathy. Reproductive:  Hysterectomy. Other:  No free intraperitoneal fluid or air. Musculoskeletal: Demineralization. No acute fracture or dislocation. IMPRESSION: 1. No acute fracture or dislocation. 2. 40 mm infrarenal abdominal aortic aneurysm. Recommend follow-up CT or MR as appropriate in 12 months and referral to or continued care with vascular specialist. (Ref.: J Vasc Surg. 2018; 67:2-77 and J Am Coll Radiol 2013;10(10):789-794.) Electronically Signed   By: Norman Gatlin M.D.   On: 05/06/2023 11:41   DG FEMUR MIN 2 VIEWS LEFT Result Date: 05/06/2023 CLINICAL DATA:  Fall, pain. EXAM: LEFT FEMUR 2 VIEWS COMPARISON:  None Available. FINDINGS: There is no evidence of fracture or other focal bone lesions. Soft tissues are unremarkable. IMPRESSION: Negative. Electronically Signed   By: Fonda Field M.D.   On: 05/06/2023 11:23   DG Pelvis 1-2 Views Result Date: 05/06/2023 CLINICAL DATA:  Fall, pain. EXAM: PELVIS - 1 VIEW COMPARISON:  None Available. FINDINGS: There is no evidence of pelvic fracture or diastasis. No pelvic bone lesions are seen. Lumbosacral degenerative changes. Bilateral sacroiliac osteophytes. Right-sided hip degenerative joint disease. IMPRESSION: Degenerative changes. Electronically Signed   By: Fonda Field M.D.   On: 05/06/2023 11:22   DG Knee Complete 4 Views Left Result Date:  05/06/2023 CLINICAL DATA:  injury.  Fall.  Painful to stand. EXAM: LEFT KNEE - COMPLETE 4+ VIEW COMPARISON:  None Available. FINDINGS: No acute fracture or dislocation. No aggressive osseous lesion. There are moderate degenerative changes of the knee joint. Meniscal chondrocalcinosis noted. No  focal soft tissue swelling. No radiopaque foreign bodies. IMPRESSION: *No acute osseous abnormality of the left knee joint. Moderate degenerative osteoarthritis. Electronically Signed   By: Ree Molt M.D.   On: 05/06/2023 10:17    Assessment/Plan 1. Benign essential hypertension (Primary) Not controlled, valsartan was recently reduced at last hospitalization Will increase to 80 mg daily at this time and have staff continue to monitor BP Follow up bmp in 1 week  2. Stage 3b chronic kidney disease (HCC) Followed by nephrology, will continue to monitor Renally adjust medication as needed Avoid nephrotoxic medication   3. Hoarseness Her Protonix  was stopped during last hospitalization and she was reporting more burping and hoariness over the lats few days which may be related to stopping protonix .  Will monitor for now and address if continues.       Baily Hovanec K. Caro BODILY [facility] [phone]

## 2023-05-27 ENCOUNTER — Telehealth: Payer: Self-pay | Admitting: *Deleted

## 2023-05-27 ENCOUNTER — Inpatient Hospital Stay: Payer: Medicare PPO | Attending: Internal Medicine

## 2023-05-27 ENCOUNTER — Encounter: Payer: Self-pay | Admitting: Internal Medicine

## 2023-05-27 ENCOUNTER — Inpatient Hospital Stay: Payer: Medicare PPO | Admitting: Internal Medicine

## 2023-05-27 DIAGNOSIS — E559 Vitamin D deficiency, unspecified: Secondary | ICD-10-CM | POA: Diagnosis not present

## 2023-05-27 DIAGNOSIS — J439 Emphysema, unspecified: Secondary | ICD-10-CM | POA: Diagnosis not present

## 2023-05-27 DIAGNOSIS — Z79811 Long term (current) use of aromatase inhibitors: Secondary | ICD-10-CM | POA: Diagnosis not present

## 2023-05-27 DIAGNOSIS — E119 Type 2 diabetes mellitus without complications: Secondary | ICD-10-CM | POA: Insufficient documentation

## 2023-05-27 DIAGNOSIS — Z90710 Acquired absence of both cervix and uterus: Secondary | ICD-10-CM | POA: Diagnosis not present

## 2023-05-27 DIAGNOSIS — Z923 Personal history of irradiation: Secondary | ICD-10-CM | POA: Diagnosis not present

## 2023-05-27 DIAGNOSIS — Z803 Family history of malignant neoplasm of breast: Secondary | ICD-10-CM | POA: Insufficient documentation

## 2023-05-27 DIAGNOSIS — R519 Headache, unspecified: Secondary | ICD-10-CM | POA: Insufficient documentation

## 2023-05-27 DIAGNOSIS — Z9013 Acquired absence of bilateral breasts and nipples: Secondary | ICD-10-CM | POA: Insufficient documentation

## 2023-05-27 DIAGNOSIS — R5383 Other fatigue: Secondary | ICD-10-CM | POA: Diagnosis not present

## 2023-05-27 DIAGNOSIS — Z1732 Human epidermal growth factor receptor 2 negative status: Secondary | ICD-10-CM | POA: Diagnosis not present

## 2023-05-27 DIAGNOSIS — Z1721 Progesterone receptor positive status: Secondary | ICD-10-CM | POA: Insufficient documentation

## 2023-05-27 DIAGNOSIS — N184 Chronic kidney disease, stage 4 (severe): Secondary | ICD-10-CM | POA: Insufficient documentation

## 2023-05-27 DIAGNOSIS — M170 Bilateral primary osteoarthritis of knee: Secondary | ICD-10-CM | POA: Insufficient documentation

## 2023-05-27 DIAGNOSIS — Z170 Estrogen receptor positive status [ER+]: Secondary | ICD-10-CM

## 2023-05-27 DIAGNOSIS — C50211 Malignant neoplasm of upper-inner quadrant of right female breast: Secondary | ICD-10-CM

## 2023-05-27 DIAGNOSIS — C50912 Malignant neoplasm of unspecified site of left female breast: Secondary | ICD-10-CM | POA: Diagnosis not present

## 2023-05-27 DIAGNOSIS — K862 Cyst of pancreas: Secondary | ICD-10-CM | POA: Diagnosis not present

## 2023-05-27 LAB — VITAMIN D 25 HYDROXY (VIT D DEFICIENCY, FRACTURES): Vit D, 25-Hydroxy: 51.03 ng/mL (ref 30–100)

## 2023-05-27 LAB — CMP (CANCER CENTER ONLY)
ALT: 15 U/L (ref 0–44)
AST: 23 U/L (ref 15–41)
Albumin: 3.7 g/dL (ref 3.5–5.0)
Alkaline Phosphatase: 66 U/L (ref 38–126)
Anion gap: 12 (ref 5–15)
BUN: 29 mg/dL — ABNORMAL HIGH (ref 8–23)
CO2: 26 mmol/L (ref 22–32)
Calcium: 9.4 mg/dL (ref 8.9–10.3)
Chloride: 101 mmol/L (ref 98–111)
Creatinine: 1.38 mg/dL — ABNORMAL HIGH (ref 0.44–1.00)
GFR, Estimated: 35 mL/min — ABNORMAL LOW (ref >60)
Glucose, Bld: 217 mg/dL — ABNORMAL HIGH (ref 70–99)
Potassium: 3.9 mmol/L (ref 3.5–5.1)
Sodium: 139 mmol/L (ref 135–145)
Total Bilirubin: 0.6 mg/dL (ref 0.0–1.2)
Total Protein: 7.1 g/dL (ref 6.5–8.1)

## 2023-05-27 LAB — CBC WITH DIFFERENTIAL (CANCER CENTER ONLY)
Abs Immature Granulocytes: 0.03 10*3/uL (ref 0.00–0.07)
Basophils Absolute: 0.1 10*3/uL (ref 0.0–0.1)
Basophils Relative: 1 %
Eosinophils Absolute: 0.3 10*3/uL (ref 0.0–0.5)
Eosinophils Relative: 3 %
HCT: 32.7 % — ABNORMAL LOW (ref 36.0–46.0)
Hemoglobin: 10.8 g/dL — ABNORMAL LOW (ref 12.0–15.0)
Immature Granulocytes: 0 %
Lymphocytes Relative: 24 %
Lymphs Abs: 2.2 10*3/uL (ref 0.7–4.0)
MCH: 30.9 pg (ref 26.0–34.0)
MCHC: 33 g/dL (ref 30.0–36.0)
MCV: 93.4 fL (ref 80.0–100.0)
Monocytes Absolute: 0.8 10*3/uL (ref 0.1–1.0)
Monocytes Relative: 10 %
Neutro Abs: 5.5 10*3/uL (ref 1.7–7.7)
Neutrophils Relative %: 62 %
Platelet Count: 223 10*3/uL (ref 150–400)
RBC: 3.5 MIL/uL — ABNORMAL LOW (ref 3.87–5.11)
RDW: 12.8 % (ref 11.5–15.5)
WBC Count: 8.9 10*3/uL (ref 4.0–10.5)
nRBC: 0 % (ref 0.0–0.2)

## 2023-05-27 NOTE — Progress Notes (Signed)
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

## 2023-05-27 NOTE — Progress Notes (Signed)
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 Burnett] stage III T2N3 [19/24 LN]; ER/PR positive HER-2 negative.   # Left breast-invasive mammary carcinoma-status post mastectomy [id number]; stage I-ER/PR positive HER-2 negative  #November 23, 2019-start adjuvant radiation bilateral [finished August 31]; no chemo;   #September 22nd 2021- anastrozole ----------------------------------------------------------------------------------------   A. RIGHT BREAST, 3:00; ULTRASOUND-GUIDED BIOPSY:  - INVASIVE MAMMARY CARCINOMA WITH MICROPAPILLARY FEATURES; Size of invasive carcinoma: 10mm in this sample  Histologic grade of invasive carcinoma: Grade 2 ; ER/PR > 905: her 2- NEG  A. RIGHT BREAST, 3:00; ULTRASOUND-GUIDED BIOPSY: - INVASIVE MAMMARY CARCINOMA WITH MICROPAPILLARY FEATURES. 10mm in this sample. Grade 2. Lymphovascular invasion: Present.   B. LYMPH NODE, RIGHT AXILLARY; ULTRASOUND-GUIDED NEEDLE CORE BIOPSY: - METASTATIC MAMMARY CARCINOMA, 6 MM IN THIS SAMPLE.   C. RIGHT BREAST, LATERAL CALCIFICATIONS; STEREOTACTIC BIOPSY: - DUCTAL CARCINOMA IN SITU, INTERMEDIATE GRADE, WITH CALCIFICATIONS. -------------------------------------------------------------   # CKD- Stage III-IV [Dr.Kolluru]  # SURVIVORSHIP:   # GENETICS:   DIAGNOSIS:  Right breast cancer stage III;  left breast cancer-stage I     Carcinoma of upper-inner quadrant of right breast in  female, estrogen receptor positive (HCC)  09/27/2019 Initial Diagnosis   Carcinoma of upper-inner quadrant of right breast in female, estrogen receptor positive (HCC)     HISTORY OF PRESENTING ILLNESS: Ambulating walking with a rolling walker.  With cousin.  Tina Curtis 89 y.o.  female synchronous bilateral breast cancer [right stage III ER/PR positive;Her2 NEG; left stage I ER/PR positive; Her2- NEG] s/p adjuvant radiation; currently on Anastrazole is here for follow-up.   In the interim patient was admitted to hospital-and d/c from [facility] 05/06/23 for acute on Chronic traumatic Subdural Hematoma.   Otherwise patient denies breast pain, has tenderness with palpation. Denies hot flashes. Appetite is fine. Taking her anastrozole    Review of Systems  Constitutional:  Positive for malaise/fatigue. Negative for chills, diaphoresis, fever and weight loss.  HENT:  Negative for nosebleeds and sore throat.   Eyes:  Negative for double vision.  Respiratory:  Negative for cough, hemoptysis, sputum production, shortness of breath and wheezing.   Cardiovascular:  Negative for chest pain, palpitations, orthopnea and leg swelling.  Gastrointestinal:  Negative for abdominal pain, blood in stool, constipation, diarrhea, heartburn, melena, nausea and vomiting.  Genitourinary:  Negative for dysuria, frequency and urgency.  Musculoskeletal:  Positive for back pain and joint pain.  Neurological:  Negative for dizziness, tingling, focal weakness, weakness and headaches.  Endo/Heme/Allergies:  Does not bruise/bleed easily.  Psychiatric/Behavioral:  Negative for depression. The patient is not nervous/anxious and does not have insomnia.      MEDICAL HISTORY:  Past Medical History:  Diagnosis Date   Arthritis    Gout   Chronic kidney disease    Diarrhea    Hypercholesteremia    Hypertension    Neuropathy    feet and lower legs   Seasonal allergies    Skin cancer    face    SURGICAL HISTORY: Past  Surgical History:  Procedure Laterality Date   ABDOMINAL HYSTERECTOMY  2000   APPENDECTOMY     BREAST BIOPSY Right 09/19/2019   affirm bx of calcs UOQ, x marker, path pending   BREAST BIOPSY Right 09/19/2019   [place] bx of mass,heart marker, path pending   BREAST BIOPSY Right 09/19/2019   [place] bx of LN, coil marker, path pending   CATARACT EXTRACTION Left    CATARACT EXTRACTION W/PHACO Right 10/24/2014   Procedure: CATARACT EXTRACTION PHACO AND INTRAOCULAR LENS PLACEMENT (IOC);  Surgeon: Lockie Mola, MD;  Location: [facility];  Service: Ophthalmology;  Laterality: Right;   ESOPHAGOGASTRODUODENOSCOPY N/A 03/07/2018   Procedure: ESOPHAGOGASTRODUODENOSCOPY (EGD);  Surgeon: Toney Reil, MD;  Location: [facility] ENDOSCOPY;  Service: Gastroenterology;  Laterality: N/A;   MASTECTOMY MODIFIED RADICAL Right 10/13/2019   Procedure: MASTECTOMY MODIFIED RADICAL;  Surgeon: Earline Mayotte, MD;  Location: [facility] ORS;  Service: General;  Laterality: Right;   RENAL ANGIOGRAPHY Right 04/11/2018   Procedure: RENAL ANGIOGRAPHY;  Surgeon: Annice Needy, MD;  Location: [facility] INVASIVE CV LAB;  Service: Cardiovascular;  Laterality: Right;   SIMPLE MASTECTOMY WITH AXILLARY SENTINEL NODE BIOPSY Left 10/13/2019   Procedure: SIMPLE MASTECTOMY TRUE CUT BIOPSY, SENTINEL NODE BIOPSY;  Surgeon: Earline Mayotte, MD;  Location: [facility] ORS;  Service: General;  Laterality: Left;    SOCIAL HISTORY: Social History   Socioeconomic History   Marital status: Widowed    Spouse name: Not on file   Number of children: Not on file   Years of education: Not on file   Highest education level: Not on file  Occupational History   Not on file  Tobacco Use   Smoking status: Never   Smokeless tobacco: Never  Vaping Use   Vaping status: Never Used  Substance and Sexual Activity   Alcohol use: No   Drug use: Never   Sexual activity: Not Currently  Other Topics Concern   Not on file  Social History Narrative   2  daughters; lives in [address]; Runner, broadcasting/film/video retd. 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    Social Drivers of Corporate investment banker Strain: Low Risk  (11/17/2022)   Received from [facility], [facility]   Overall Financial Resource Strain (CARDIA)    Difficulty of Paying Living Expenses: Not hard at all  Food Insecurity: No Food Insecurity (05/07/2023)   Hunger Vital Sign    Worried About Running Out of Food in the Last Year: Never true    Ran Out of Food in the Last Year: Never true  Transportation Needs: No Transportation Needs (05/07/2023)   PRAPARE - Administrator, Civil Service (Medical): No    Lack of Transportation (Non-Medical): No  Physical Activity: Not on file  Stress: Not on file  Social Connections: Not on file  Intimate Partner Violence: Not At Risk (05/07/2023)   Humiliation, Afraid, Rape, and Kick questionnaire    Fear of Current or Ex-Partner: No    Emotionally Abused: No    Physically Abused: No    Sexually Abused: No    FAMILY HISTORY: Family History  Problem Relation Age of Onset   Hypertension Mother    Hypertension Father    Stroke Father    Breast cancer Daughter 22    ALLERGIES:  is allergic to penicillins, drug ingredient [zinc], and empagliflozin.  MEDICATIONS:  Current Outpatient Medications  Medication Sig Dispense Refill   acetaminophen (TYLENOL) 325 MG tablet Take 650 mg by mouth 3 (three) times daily.     acetaminophen (  TYLENOL) 500 MG tablet Take 500 mg by mouth every 8 (eight) hours as needed.     amLODipine (NORVASC) 5 MG tablet Take 1 tablet (5 mg total) by mouth every evening. Skip the dose if SBP less than 140 mmHg     anastrozole (ARIMIDEX) 1 MG tablet Take 1 tablet (1 mg total) by mouth daily. 90 tablet 1   calcitRIOL (ROCALTROL) 0.25 MCG capsule Take 0.25 mcg by mouth as directed. Monday,Wednesday, Friday     calcium carbonate (OS-CAL - DOSED IN MG OF ELEMENTAL  CALCIUM) 1250 (500 Ca) MG tablet Take 2 tablets by mouth daily with breakfast.     citalopram (CELEXA) 10 MG tablet Take 10 mg by mouth daily.      cyanocobalamin (VITAMIN B12) 1000 MCG tablet Take 1,000 mcg by mouth daily.     diclofenac Sodium (VOLTAREN) 1 % GEL Apply 2 g topically every 6 (six) hours. To bilateral legs.     docusate sodium (COLACE) 100 MG capsule Take 100 mg by mouth daily as needed for mild constipation.     DULoxetine (CYMBALTA) 60 MG capsule Take 60 mg by mouth daily.     gabapentin (NEURONTIN) 300 MG capsule Take 300 mg by mouth 2 (two) times daily.     hydrALAZINE (APRESOLINE) 50 MG tablet Take 1 tablet (50 mg total) by mouth 3 (three) times daily. Skip the dose if SBP less than 130 mmHg     loratadine (CLARITIN) 10 MG tablet Take 10 mg by mouth daily as needed for allergies. AM     melatonin 3 MG TABS tablet Take 3 mg by mouth at bedtime.     metoprolol succinate (TOPROL-XL) 25 MG 24 hr tablet Take 1 tablet (25 mg total) by mouth daily.     polyethylene glycol (MIRALAX / GLYCOLAX) 17 g packet Take 17 g by mouth daily. Skip the dose if no constipation     simvastatin (ZOCOR) 20 MG tablet Take 20 mg by mouth every evening. PM     sodium bicarbonate 650 MG tablet Take 1 tablet (650 mg total) by mouth 3 (three) times daily.     valsartan (DIOVAN) 80 MG tablet Take 40 mg by mouth daily.     Vitamin D, Ergocalciferol, (DRISDOL) 1.25 MG (50000 UNIT) CAPS capsule Take 50,000 Units by mouth every 7 (seven) days.     Wheat Dextrin (BENEFIBER ON THE GO) PACK Take 1 packet by mouth daily as needed.     Zinc Oxide (TRIPLE PASTE) 12.8 % ointment Apply 1 Application topically. To buttocks every shift.     No current facility-administered medications for this visit.      Marland Kitchen  PHYSICAL EXAMINATION: ECOG PERFORMANCE STATUS: 0 - Asymptomatic  Vitals:   05/27/23 0924 05/27/23 0937  BP: (!) 162/80 (!) 155/74  Pulse: 78   Temp: (!) 97.3 F (36.3 C)   SpO2: 99%       Filed  Weights   05/27/23 0924  Weight: 141 lb (64 kg)       Physical Exam Constitutional:      Comments: Walks with a rolling walker.  Alone.  HENT:     Head: Normocephalic and atraumatic.     Mouth/Throat:     Pharynx: No oropharyngeal exudate.  Eyes:     Pupils: Pupils are equal, round, and reactive to light.  Cardiovascular:     Rate and Rhythm: Normal rate and regular rhythm.  Pulmonary:     Effort: Pulmonary effort is normal. No  respiratory distress.     Breath sounds: Normal breath sounds. No wheezing.  Abdominal:     General: Bowel sounds are normal. There is no distension.     Palpations: Abdomen is soft. There is no mass.     Tenderness: There is no abdominal tenderness. There is no guarding or rebound.  Musculoskeletal:        General: No tenderness. Normal range of motion.     Cervical back: Normal range of motion and neck supple.  Skin:    General: Skin is warm.     Comments: Bruising bruising noted on the chest and left arm.  Neurological:     Mental Status: She is alert and oriented to person, place, and time.  Psychiatric:        Mood and Affect: Affect normal.     Results for TAJ, DILS (MRN [id number]) as of 01/15/2021 15:37  Ref. Range 08/27/2020 09:55 09/02/2020 12:32 10/22/2020 14:01 12/04/2020 14:35 01/15/2021 14:50  CA 15-3 Latest Ref Range: 0.0 - 25.0 U/mL   32.0 (H) 27.9 (H)   CA 27.29 Latest Ref Range: 0.0 - 38.6 U/mL   38.0 27.8   CEA Latest Ref Range: 0.0 - 4.7 ng/mL   4.1 3.6    LABORATORY DATA:  I have reviewed the data as listed Lab Results  Component Value Date   WBC 8.9 05/27/2023   HGB 10.8 (L) 05/27/2023   HCT 32.7 (L) 05/27/2023   MCV 93.4 05/27/2023   PLT 223 05/27/2023   Recent Labs    06/05/22 2038 06/06/22 0441 09/08/22 0913 09/09/22 0000 11/17/22 1304 02/22/23 1444 03/29/23 0744 05/09/23 0436 05/10/23 0210 05/11/23 0515 05/20/23 0000 05/27/23 0909  NA  --    < > 131*   < > 139 138   < > 135 133* 134* 140 139  K  --     < > 3.7   < > 3.7 4.1   < > 3.8 4.0 3.9 4.6 3.9  CL  --    < > 101   < > 103 105   < > 106 105 109 105 101  CO2  --    < > 23   < > 26 27   < > 18* 19* 17* 25* 26  GLUCOSE  --    < > 154*   < > 158* 114*   < > 119* 137* 137*  --  217*  BUN  --    < > 19   < > 38* 34*   < > 34* 39* 45* 28* 29*  CREATININE  --    < > 1.41*   < > 1.44* 1.76*   < > 1.47* 1.56* 1.46* 1.2* 1.38*  CALCIUM  --    < > 7.6*   < > 9.6 10.3   < > 8.8* 8.6* 8.8* 9.6 9.4  GFRNONAA  --    < > 35*   < > 34* 27*   < > 33* 31* 33*  --  35*  PROT 7.4   < > 6.8   < > 7.8 7.5  --   --   --   --   --  7.1  ALBUMIN 4.0   < > 2.7*   < > 4.2 4.1   < > 3.5 3.5  --   --  3.7  AST 27   < > 62*   < > 27 24  --   --   --   --   --  23  ALT 19   < > 34   < > 17 14  --   --   --   --   --  15  ALKPHOS 95   < > 111   < > 81 62  --   --   --   --   --  66  BILITOT 0.7   < > 0.6   < > 0.6 0.4  --   --   --   --   --  0.6  BILIDIR <0.1  --  <0.1  --   --   --   --   --   --   --   --   --   IBILI NOT CALCULATED  --  NOT CALCULATED  --   --   --   --   --   --   --   --   --    < > = values in this interval not displayed.          RADIOGRAPHIC STUDIES: I have personally reviewed the radiological images as listed and agreed with the findings in the report. ECHOCARDIOGRAM COMPLETE Result Date: 05/07/2023    ECHOCARDIOGRAM REPORT   Patient Name:   Tina Curtis Date of Exam: 05/07/2023 Medical Rec #:  [id number]     Height:       62.0 in Accession #:    [id number]    Weight:       138.2 lb Date of Birth:  1926-07-19    BSA:          1.634 m Patient Age:    89 years      BP:           142/63 mmHg Patient Gender: F             HR:           94 bpm. Exam Location:  [facility] Procedure: 2D Echo, Cardiac Doppler and Color Doppler Indications:     Stroke  History:         Patient has prior history of Echocardiogram examinations, most                  recent 08/27/2022. CHF, Previous Myocardial Infarction, Stroke;                  Signs/Symptoms:Chest  Pain, Dizziness/Lightheadedness and                  Fatigue. CKD, breast CA.  Sonographer:     Mikki Harbor Referring Phys:  [id number] Ezequiel Essex Diagnosing Phys: Julien Nordmann MD  Sonographer Comments: Technically difficult study due to poor echo windows and suboptimal parasternal window. IMPRESSIONS  1. Left ventricular ejection fraction, by estimation, is 60 to 65%. The left ventricle has normal function. The left ventricle has no regional wall motion abnormalities. Left ventricular diastolic parameters are consistent with Grade I diastolic dysfunction (impaired relaxation).  2. Right ventricular systolic function is normal. The right ventricular size is normal. There is mildly elevated pulmonary artery systolic pressure. The estimated right ventricular systolic pressure is 41.9 mmHg.  3. The mitral valve is normal in structure. No evidence of mitral valve regurgitation. No evidence of mitral stenosis.  4. The aortic valve is normal in structure. There is mild calcification of the aortic valve. Aortic valve regurgitation is mild. Aortic valve sclerosis/calcification is present, without any evidence of aortic stenosis. Aortic valve mean gradient measures  8.0 mmHg.  5. The inferior vena cava is normal in size with greater than 50% respiratory variability, suggesting right atrial pressure of 3 mmHg. FINDINGS  Left Ventricle: Left ventricular ejection fraction, by estimation, is 60 to 65%. The left ventricle has normal function. The left ventricle has no regional wall motion abnormalities. The left ventricular internal cavity size was normal in size. There is  no left ventricular hypertrophy. Left ventricular diastolic parameters are consistent with Grade I diastolic dysfunction (impaired relaxation). Right Ventricle: The right ventricular size is normal. No increase in right ventricular wall thickness. Right ventricular systolic function is normal. There is mildly elevated pulmonary artery systolic pressure.  The tricuspid regurgitant velocity is 2.91  m/s, and with an assumed right atrial pressure of 8 mmHg, the estimated right ventricular systolic pressure is 41.9 mmHg. Left Atrium: Left atrial size was normal in size. Right Atrium: Right atrial size was normal in size. Pericardium: There is no evidence of pericardial effusion. Mitral Valve: The mitral valve is normal in structure. There is mild calcification of the mitral valve leaflet(s). No evidence of mitral valve regurgitation. No evidence of mitral valve stenosis. MV peak gradient, 5.8 mmHg. The mean mitral valve gradient  is 3.0 mmHg. Tricuspid Valve: The tricuspid valve is normal in structure. Tricuspid valve regurgitation is mild . No evidence of tricuspid stenosis. Aortic Valve: The aortic valve is normal in structure. There is mild calcification of the aortic valve. Aortic valve regurgitation is mild. Aortic valve sclerosis/calcification is present, without any evidence of aortic stenosis. Aortic valve mean gradient measures 8.0 mmHg. Aortic valve peak gradient measures 17.1 mmHg. Aortic valve area, by VTI measures 2.43 cm. Pulmonic Valve: The pulmonic valve was normal in structure. Pulmonic valve regurgitation is not visualized. No evidence of pulmonic stenosis. Aorta: The aortic root is normal in size and structure. Venous: The inferior vena cava is normal in size with greater than 50% respiratory variability, suggesting right atrial pressure of 3 mmHg. IAS/Shunts: No atrial level shunt detected by color flow Doppler.  LEFT VENTRICLE PLAX 2D LVIDd:         3.00 cm   Diastology LVIDs:         1.90 cm   LV e' medial:    5.55 cm/s LV PW:         0.80 cm   LV E/e' medial:  17.3 LV IVS:        1.40 cm   LV e' lateral:   5.98 cm/s LVOT diam:     1.90 cm   LV E/e' lateral: 16.0 LV SV:         87 LV SV Index:   53 LVOT Area:     2.84 cm  RIGHT VENTRICLE RV Basal diam:  3.00 cm RV Mid diam:    2.20 cm RV S prime:     20.40 cm/s TAPSE (M-mode): 2.6 cm LEFT ATRIUM              Index        RIGHT ATRIUM           Index LA diam:        2.90 cm 1.77 cm/m   RA Area:     11.90 cm LA Vol (A2C):   51.4 ml 31.46 ml/m  RA Volume:   23.70 ml  14.50 ml/m LA Vol (A4C):   36.5 ml 22.34 ml/m LA Biplane Vol: 45.8 ml 28.03 ml/m  AORTIC VALVE AV Area (Vmax):    2.08  cm AV Area (Vmean):   2.03 cm AV Area (VTI):     2.43 cm AV Vmax:           207.00 cm/s AV Vmean:          132.000 cm/s AV VTI:            0.357 m AV Peak Grad:      17.1 mmHg AV Mean Grad:      8.0 mmHg LVOT Vmax:         152.00 cm/s LVOT Vmean:        94.700 cm/s LVOT VTI:          0.306 m LVOT/AV VTI ratio: 0.86  AORTA Ao Root diam: 3.10 cm MITRAL VALVE                TRICUSPID VALVE MV Area (PHT): 3.23 cm     TR Peak grad:   33.9 mmHg MV Area VTI:   3.34 cm     TR Vmax:        291.00 cm/s MV Peak grad:  5.8 mmHg MV Mean grad:  3.0 mmHg     SHUNTS MV Vmax:       1.20 m/s     Systemic VTI:  0.31 m MV Vmean:      78.9 cm/s    Systemic Diam: 1.90 cm MV Decel Time: 235 msec MV E velocity: 95.90 cm/s MV A velocity: 134.00 cm/s MV E/A ratio:  0.72 Julien Nordmann MD Electronically signed by Julien Nordmann MD Signature Date/Time: 05/07/2023/3:01:11 PM    Final    CT HEAD WO CONTRAST ( ) Result Date: 05/06/2023 CLINICAL DATA:  Subdural hematoma SDH expansion evaluation EXAM: CT HEAD WITHOUT CONTRAST TECHNIQUE: Contiguous axial images were obtained from the base of the skull through the vertex without intravenous contrast. RADIATION DOSE REDUCTION: This exam was performed according to the departmental dose-optimization program which includes automated exposure control, adjustment of the mA and/or kV according to patient size and/or use of iterative reconstruction technique. COMPARISON:  Same-day head CT FINDINGS: Brain: No significant change in size of the mixed density right cerebral convexity subdural hematoma measuring up to 2.9 cm in thickness. Unchanged 6 mm leftward midline shift. No new sites of hemorrhage. No CT  evidence of an acute cortical infarct. No hydrocephalus. Unchanged 2 mm hyperdense focus along the septum pellucidum Vascular: No hyperdense vessel or unexpected calcification. Skull: Normal. Negative for fracture or focal lesion. Sinuses/Orbits: Small right mastoid effusion. No middle ear effusion. Paranasal sinuses are clear. Bilateral lens replacement. Orbits are otherwise unremarkable. Other: None. IMPRESSION: 1. No significant change in size of the mixed density right cerebral convexity subdural hematoma measuring up to 2.9 cm in thickness. Unchanged 6 mm leftward midline shift. 2. Unchanged 2 mm hyperdense focus along the septum pellucidum. Electronically Signed   By: Lorenza Cambridge M.D.   On: 05/06/2023 17:08   CT Head Wo Contrast Result Date: 05/06/2023 CLINICAL DATA:  Provided history: Head trauma, minor. Neck trauma. Additional history provided: Fall. EXAM: CT HEAD WITHOUT CONTRAST CT CERVICAL SPINE WITHOUT CONTRAST TECHNIQUE: Multidetector CT imaging of the head and cervical spine was performed following the standard protocol without intravenous contrast. Multiplanar CT image reconstructions of the cervical spine were also generated. RADIATION DOSE REDUCTION: This exam was performed according to the departmental dose-optimization program which includes automated exposure control, adjustment of the mA and/or kV according to patient size and/or use of iterative reconstruction technique. COMPARISON:  Head CT 03/29/2023.  Cervical spine CT 03/29/2023. FINDINGS: CT HEAD FINDINGS Brain: Generalized cerebral atrophy. Mixed density subdural hematoma overlying the right cerebral hemisphere, measuring 2 cm in thickness. A significant portion of the hematoma is comprised of hyperdense acute components. There is mass effect upon the underlying right cerebral hemisphere with 5 mm leftward midline shift. 3 mm hyperdense focus along the septum pellucidum, new from the prior head CT of 03/29/2023. This could  potentially reflect a small amount of hemorrhage within the septum pellucidum or within the left lateral ventricle (series 2, image 16) (series 4, image 33). Sequelae of mild chronic small vessel ischemic disease. Prominent perivascular space within the left basal ganglia inferiorly. No demarcated cortical infarct. No evidence of an intracranial mass. Vascular: No hyperdense vessel.  Atherosclerotic calcifications. Skull: No calvarial fracture or aggressive osseous lesion. Sinuses/Orbits: No mass or acute finding within the imaged orbits. No significant paranasal sinus disease at the imaged levels. CT CERVICAL SPINE FINDINGS Alignment: 2 mm grade 1 anterolisthesis at C7-T1, T1-T2 and T2-T3. Skull base and vertebrae: The basion-dental and atlanto-dental intervals are maintained.No evidence of acute fracture to the cervical spine. 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 Soft tissues and spinal canal: No prevertebral fluid or swelling. No visible canal hematoma. Disc levels: Cervical spondylosis with multilevel disc space narrowing, disc bulges/central disc protrusions, posterior disc osteophyte complexes, uncovertebral hypertrophy and facet arthrosis. Disc space narrowing is greatest at C3-C4 (advanced), C5-C6 (moderate-to-advanced), C6-C7 (moderate-to-advanced) and T2-T3 (moderate-to-advanced). No appreciable high-grade spinal canal stenosis. Multilevel bony neural foraminal narrowing. Upper chest: No acute finding. CT head results were called by telephone at the time of interpretation on 05/06/2023 at 11:48 am to provider [facility] , who verbally acknowledged these results. IMPRESSION: CT head: 1. Mixed density subdural hematoma overlying the right cerebral hemisphere, measuring 2 cm in thickness. A significant portion of the hematoma is comprised of acute components. Mass effect upon the underlying right cerebral hemisphere with 5 mm leftward midline shift. 2. 3 mm hyperdense focus along the septum pellucidum,  which could potentially reflect a small amount of hemorrhage within the septum pellucidum or within the left lateral ventricle. Attention recommended on imaging follow-up. CT cervical spine: 1. No evidence of an acute cervical spine fracture. 2. Mild grade 1 anterolisthesis at C7-T1, T1-T2 and T2-T3, unchanged from the prior cervical spine CT of 03/29/2023. 3. Cervical spondylosis as described. 4. 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 Electronically Signed   By: Jackey Loge D.O.   On: 05/06/2023 12:02   CT Cervical Spine Wo Contrast Result Date: 05/06/2023 CLINICAL DATA:  Provided history: Head trauma, minor. Neck trauma. Additional history provided: Fall. EXAM: CT HEAD WITHOUT CONTRAST CT CERVICAL SPINE WITHOUT CONTRAST TECHNIQUE: Multidetector CT imaging of the head and cervical spine was performed following the standard protocol without intravenous contrast. Multiplanar CT image reconstructions of the cervical spine were also generated. RADIATION DOSE REDUCTION: This exam was performed according to the departmental dose-optimization program which includes automated exposure control, adjustment of the mA and/or kV according to patient size and/or use of iterative reconstruction technique. COMPARISON:  Head CT 03/29/2023.  Cervical spine CT 03/29/2023. FINDINGS: CT HEAD FINDINGS Brain: Generalized cerebral atrophy. Mixed density subdural hematoma overlying the right cerebral hemisphere, measuring 2 cm in thickness. A significant portion of the hematoma is comprised of hyperdense acute components. There is mass effect upon the underlying right cerebral hemisphere with 5 mm leftward midline shift. 3 mm hyperdense focus along the septum pellucidum, new from the prior head  CT of 03/29/2023. This could potentially reflect a small amount of hemorrhage within the septum pellucidum or within the left lateral ventricle (series 2, image 16) (series 4, image 33). Sequelae of mild chronic small vessel ischemic disease.  Prominent perivascular space within the left basal ganglia inferiorly. No demarcated cortical infarct. No evidence of an intracranial mass. Vascular: No hyperdense vessel.  Atherosclerotic calcifications. Skull: No calvarial fracture or aggressive osseous lesion. Sinuses/Orbits: No mass or acute finding within the imaged orbits. No significant paranasal sinus disease at the imaged levels. CT CERVICAL SPINE FINDINGS Alignment: 2 mm grade 1 anterolisthesis at C7-T1, T1-T2 and T2-T3. Skull base and vertebrae: The basion-dental and atlanto-dental intervals are maintained.No evidence of acute fracture to the cervical spine. 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 Soft tissues and spinal canal: No prevertebral fluid or swelling. No visible canal hematoma. Disc levels: Cervical spondylosis with multilevel disc space narrowing, disc bulges/central disc protrusions, posterior disc osteophyte complexes, uncovertebral hypertrophy and facet arthrosis. Disc space narrowing is greatest at C3-C4 (advanced), C5-C6 (moderate-to-advanced), C6-C7 (moderate-to-advanced) and T2-T3 (moderate-to-advanced). No appreciable high-grade spinal canal stenosis. Multilevel bony neural foraminal narrowing. Upper chest: No acute finding. CT head results were called by telephone at the time of interpretation on 05/06/2023 at 11:48 am to provider [facility] , who verbally acknowledged these results. IMPRESSION: CT head: 1. Mixed density subdural hematoma overlying the right cerebral hemisphere, measuring 2 cm in thickness. A significant portion of the hematoma is comprised of acute components. Mass effect upon the underlying right cerebral hemisphere with 5 mm leftward midline shift. 2. 3 mm hyperdense focus along the septum pellucidum, which could potentially reflect a small amount of hemorrhage within the septum pellucidum or within the left lateral ventricle. Attention recommended on imaging follow-up. CT cervical spine: 1. No evidence of an acute  cervical spine fracture. 2. Mild grade 1 anterolisthesis at C7-T1, T1-T2 and T2-T3, unchanged from the prior cervical spine CT of 03/29/2023. 3. Cervical spondylosis as described. 4. 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 Electronically Signed   By: Jackey Loge D.O.   On: 05/06/2023 12:02   CT PELVIS WO CONTRAST Result Date: 05/06/2023 CLINICAL DATA:  Hip trauma, fracture suspected bruising of left leg EXAM: CT PELVIS WITHOUT CONTRAST TECHNIQUE: Multidetector CT imaging of the pelvis was performed following the standard protocol without intravenous contrast. RADIATION DOSE REDUCTION: This exam was performed according to the departmental dose-optimization program which includes automated exposure control, adjustment of the mA and/or kV according to patient size and/or use of iterative reconstruction technique. COMPARISON:  Radiograph 05/06/2023 and CT 08/22/2022 FINDINGS: Urinary Tract:  No acute abnormality. Bowel:  No acute abnormality. Vascular/Lymphatic: Arterial atherosclerotic calcification. 40 mm infrarenal abdominal aortic aneurysm. No lymphadenopathy. Reproductive:  Hysterectomy. Other:  No free intraperitoneal fluid or air. Musculoskeletal: Demineralization. No acute fracture or dislocation. IMPRESSION: 1. No acute fracture or dislocation. 2. 40 mm infrarenal abdominal aortic aneurysm. Recommend follow-up CT or MR as appropriate in 12 months and referral to or continued care with vascular specialist. (Ref.: J Vasc Surg. 2018; 67:2-77 and J Am Coll Radiol 2013;10(10):789-794.) Electronically Signed   By: Minerva Fester M.D.   On: 05/06/2023 11:41   DG FEMUR MIN 2 VIEWS LEFT Result Date: 05/06/2023 CLINICAL DATA:  Fall, pain. EXAM: LEFT FEMUR 2 VIEWS COMPARISON:  None Available. FINDINGS: There is no evidence of fracture or other focal bone lesions. Soft tissues are unremarkable. IMPRESSION: Negative. Electronically Signed   By: Layla Maw M.D.   On:  05/06/2023 11:23   DG Pelvis 1-2  Views Result Date: 05/06/2023 CLINICAL DATA:  Fall, pain. EXAM: PELVIS - 1 VIEW COMPARISON:  None Available. FINDINGS: There is no evidence of pelvic fracture or diastasis. No pelvic bone lesions are seen. Lumbosacral degenerative changes. Bilateral sacroiliac osteophytes. Right-sided hip degenerative joint disease. IMPRESSION: Degenerative changes. Electronically Signed   By: Layla Maw M.D.   On: 05/06/2023 11:22   DG Knee Complete 4 Views Left Result Date: 05/06/2023 CLINICAL DATA:  injury.  Fall.  Painful to stand. EXAM: LEFT KNEE - COMPLETE 4+ VIEW COMPARISON:  None Available. FINDINGS: No acute fracture or dislocation. No aggressive osseous lesion. There are moderate degenerative changes of the knee joint. Meniscal chondrocalcinosis noted. No  focal soft tissue swelling. No radiopaque foreign bodies. IMPRESSION: *No acute osseous abnormality of the left knee joint. Moderate degenerative osteoarthritis. Electronically Signed   By: Jules Schick M.D.   On: 05/06/2023 10:17    ASSESSMENT & PLAN:   Carcinoma of upper-inner quadrant of right breast in female, estrogen receptor positive (HCC)  # Synchronous bilateral breast cancer-status post bilateral mastectomies- Right Breast-stage III ER/PR positive HER-2 NEG; & Left breast cancer stage II ER/PR Pos; Her 2 NEG. PET scan- April 20th, 2023-  Interval bilateral mastectomy and right axillary node dissection. No evidence of chest wall recurrence or thoracic metastatic disease.  Questionable small hypermetabolic lesion centrally in the right hepatic lobe; MRI LIVER  MAY 25th, 2023- NEGATIVE for any cancer; but cysts [see below].    # CONTINUE  anastrozole [started in June 2021]. Recommend continue  5- 10 years.  No clinical progression of disease or recurrence of breast cancer.  Continue surveillance without imaging at this time.  # Subdural hematoma: [on asprin+plavix]- DEC 2024- s/p fall- awaiting repeat follow up with neurosurgery.   #  chronic headaches-/tremors/peripheral neuropathy ? [facility] Echevaria Dr.Shah > 4 years];  MRI- Brain May  2023-  No acute intracranial pathology. S/p evaluation with Dr.Shah- on baclofen. Stable.   # JAN 2024- [hospital] acute hypoxic respiratory failure. 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 Stable.   # Elevated BG- PBF- 108 [Dr.Baboff- HbA1C- 7.4]; not on medications.  Defer to PCP re:  management of DM-  Stable.   # Mild-moderate arthritic pain-osteoarthritis [bil knee L >R] G-1-2; continue tylenol prn.  Stable.   # vit D def [on ergocalciferol]--Stable.   # Fatigue ? Etiology; s/p Evaluation with Marisue Humble- s/p CARE program- Stable.   # CKD- Stage- IV- GFR 29 [Dr.Kolluru]; -Stable.   # NOV 2021- OsteoporosisT-scoreof -3.7.on ca+vit D. Prolia q 6 months [Feb 2023]- BMD- April 2024- stable.    # MAY 2023[facility]]-  Multi cystic lesions of the pancreas with enlargement measuring 2.6 cm greatest axial dimension; with relative stability since 2021. without high-risk features aside from enlargement since 2019. Recommend in may in 2025- HOLD off for now given multiple medical issues/age etc.    Prolia q 36m  # DISPOSITION:  # follow up in 3 month- MD; labs- cbc/cmp; ca27;29; CEA; ca15-3; 25-OH vit D; prolia--  Dr.B  Cc; [email or web address]   Earna Coder, MD 05/27/2023 10:02 AM

## 2023-05-27 NOTE — Telephone Encounter (Signed)
Heather nurse at [facility] has to have a met be today and the patient told her that she had labs already today and the patient wanted her to see if we can send her the labs for met c.  Herbert Seta gave me the fax #[phone] and I sent the fax and it went through

## 2023-05-27 NOTE — Assessment & Plan Note (Addendum)
#   Synchronous bilateral breast cancer-status post bilateral mastectomies- Right Breast-stage III ER/PR positive HER-2 NEG; & Left breast cancer stage II ER/PR Pos; Her 2 NEG. PET scan- April 20th, 2023-  Interval bilateral mastectomy and right axillary node dissection. No evidence of chest wall recurrence or thoracic metastatic disease.  Questionable small hypermetabolic lesion centrally in the right hepatic lobe; MRI LIVER  MAY 25th, 2023- NEGATIVE for any cancer; but cysts [see below].    # CONTINUE  anastrozole [started in June 2021]. Recommend continue  5- 10 years.  No clinical progression of disease or recurrence of breast cancer.  Continue surveillance without imaging at this time.  # Subdural hematoma: [on asprin+plavix]- DEC 2024- s/p fall- awaiting repeat follow up with neurosurgery.   # chronic headaches-/tremors/peripheral neuropathy ? [facility] Echevaria Dr.Shah > 4 years];  MRI- Brain May  2023-  No acute intracranial pathology. S/p evaluation with Dr.Shah- on baclofen. Stable.   # JAN 2024- [hospital] acute hypoxic respiratory failure. 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 Stable.   # Elevated BG- PBF- 108 [Dr.Baboff- HbA1C- 7.4]; not on medications.  Defer to PCP re:  management of DM-  Stable.   # Mild-moderate arthritic pain-osteoarthritis [bil knee L >R] G-1-2; continue tylenol prn.  Stable.   # vit D def [on ergocalciferol]--Stable.   # Fatigue ? Etiology; s/p Evaluation with Marisue Humble- s/p CARE program- Stable.   # CKD- Stage- IV- GFR 29 [Dr.Kolluru]; -Stable.   # NOV 2021- OsteoporosisT-scoreof -3.7.on ca+vit D. Prolia q 6 months [Feb 2023]- BMD- April 2024- stable.    # MAY 2023[facility]]-  Multi cystic lesions of the pancreas with enlargement measuring 2.6 cm greatest axial dimension; with relative stability since 2021. without high-risk features aside from enlargement since  2019. Recommend in may in 2025- HOLD off for now given multiple medical issues/age etc.    Prolia q 40m  # DISPOSITION:  # follow up in 3 month- MD; labs- cbc/cmp; ca27;29; CEA; ca15-3; 25-OH vit D; prolia--  Dr.B  Cc; Dr.Baboff

## 2023-05-28 LAB — CEA: CEA: 3.5 ng/mL (ref 0.0–4.7)

## 2023-05-28 LAB — CANCER ANTIGEN 15-3: CA 15-3: 25.9 U/mL — ABNORMAL HIGH (ref 0.0–25.0)

## 2023-05-28 LAB — CANCER ANTIGEN 27.29: CA 27.29: 26.1 U/mL (ref 0.0–38.6)

## 2023-05-31 ENCOUNTER — Non-Acute Institutional Stay (SKILLED_NURSING_FACILITY): Payer: Self-pay | Admitting: Student

## 2023-05-31 DIAGNOSIS — C7951 Secondary malignant neoplasm of bone: Secondary | ICD-10-CM | POA: Diagnosis not present

## 2023-05-31 DIAGNOSIS — N184 Chronic kidney disease, stage 4 (severe): Secondary | ICD-10-CM

## 2023-05-31 DIAGNOSIS — E1122 Type 2 diabetes mellitus with diabetic chronic kidney disease: Secondary | ICD-10-CM

## 2023-05-31 DIAGNOSIS — Z8719 Personal history of other diseases of the digestive system: Secondary | ICD-10-CM

## 2023-05-31 DIAGNOSIS — N1832 Chronic kidney disease, stage 3b: Secondary | ICD-10-CM

## 2023-05-31 DIAGNOSIS — I5032 Chronic diastolic (congestive) heart failure: Secondary | ICD-10-CM | POA: Diagnosis not present

## 2023-05-31 DIAGNOSIS — I10 Essential (primary) hypertension: Secondary | ICD-10-CM | POA: Diagnosis not present

## 2023-05-31 DIAGNOSIS — R350 Frequency of micturition: Secondary | ICD-10-CM

## 2023-05-31 LAB — BASIC METABOLIC PANEL
BUN: 24 — AB (ref 4–21)
CO2: 28 — AB (ref 13–22)
Chloride: 105 (ref 99–108)
Creatinine: 1.4 — AB (ref 0.5–1.1)
Glucose: 128
Potassium: 4 meq/L (ref 3.5–5.1)
Sodium: 139 (ref 137–147)

## 2023-05-31 LAB — COMPREHENSIVE METABOLIC PANEL: Calcium: 8.9 (ref 8.7–10.7)

## 2023-05-31 NOTE — Progress Notes (Signed)
Location:  Other Nursing Home Room Number: [facility] [address]A Place of Service:  SNF [phone]) Provider:  Carilyn Goodpasture, Berna Spare, MD  Patient Care Team: [name], MD as PCP - General (Family Medicine) Earna Coder, MD as Consulting Physician (Internal Medicine) Lemar Livings Merrily Pew, MD as Consulting Physician (General Surgery) Scarlett Presto, RN (Inactive) as Oncology Nurse Navigator Carmina Miller, MD as Referring Physician (Radiation Oncology)  Extended Emergency Contact Information Primary Emergency Contact: [facility] Phone: [phone] Relation: Daughter Secondary Emergency Contact: Rider,Kimberly Mobile Phone: [phone] Relation: Daughter  Code Status:  DNR Goals of care: Advanced Directive information    05/27/2023    9:05 AM  Advanced Directives  Does Patient Have a Medical Advance Directive? Yes  Type of Estate agent of [place];Living will     Chief Complaint  Patient presents with   Urinary Frequency    HPI:  Pt is a 89 y.o. female seen today for an acute visit for changes in bowel habits.   Discussed the use of AI scribe software for clinical note transcription with the patient, who gave verbal consent to proceed.  History of Present Illness         History of Present Illness Tina Curtis, a patient with macular degeneration, presents with urinary frequency and altered bowel habits. She reports frequent urination, including nocturia with two to three episodes per night, a longstanding issue. She also describes a change in bowel movements, with a mixture of formed and loose stools, often accompanied by gas. The patient notes an increase in the frequency of bowel movements, occurring daily, and often coinciding with urination. However, she denies any incontinence or accidents.  At home, she occasionally takes an unidentified stool softener, likely Senna, when she perceives her bowel movements as abnormal. She also  mentions a brownish-gray pill, possibly Benefiber, although she does not take it regularly.  The patient also has a history of a subdural hematoma, which has resulted in left-sided weakness. She expresses a desire for increased physical therapy to improve her mobility. She is due for a follow-up with neurosurgery regarding the brain bleed. The patient is motivated and actively engages in exercises independently, expressing a strong desire to regain function on her left side.    Past Medical History:  Diagnosis Date   Arthritis    Gout   Chronic kidney disease    Diarrhea    Hypercholesteremia    Hypertension    Neuropathy    feet and lower legs   Seasonal allergies    Skin cancer    face   Past Surgical History:  Procedure Laterality Date   ABDOMINAL HYSTERECTOMY  2000   APPENDECTOMY     BREAST BIOPSY Right 09/19/2019   affirm bx of calcs UOQ, x marker, path pending   BREAST BIOPSY Right 09/19/2019   [place] bx of mass,heart marker, path pending   BREAST BIOPSY Right 09/19/2019   [place] bx of LN, coil marker, path pending   CATARACT EXTRACTION Left    CATARACT EXTRACTION W/PHACO Right 10/24/2014   Procedure: CATARACT EXTRACTION PHACO AND INTRAOCULAR LENS PLACEMENT (IOC);  Surgeon: Lockie Mola, MD;  Location: [facility];  Service: Ophthalmology;  Laterality: Right;   ESOPHAGOGASTRODUODENOSCOPY N/A 03/07/2018   Procedure: ESOPHAGOGASTRODUODENOSCOPY (EGD);  Surgeon: Toney Reil, MD;  Location: [facility] ENDOSCOPY;  Service: Gastroenterology;  Laterality: N/A;   MASTECTOMY MODIFIED RADICAL Right 10/13/2019   Procedure: MASTECTOMY MODIFIED RADICAL;  Surgeon: Earline Mayotte, MD;  Location: [facility] ORS;  Service: General;  Laterality: Right;   RENAL ANGIOGRAPHY Right 04/11/2018   Procedure: RENAL ANGIOGRAPHY;  Surgeon: Annice Needy, MD;  Location: [facility] INVASIVE CV LAB;  Service: Cardiovascular;  Laterality: Right;   SIMPLE MASTECTOMY WITH AXILLARY SENTINEL NODE BIOPSY Left  10/13/2019   Procedure: SIMPLE MASTECTOMY TRUE CUT BIOPSY, SENTINEL NODE BIOPSY;  Surgeon: Earline Mayotte, MD;  Location: [facility] ORS;  Service: General;  Laterality: Left;    Allergies  Allergen Reactions   Penicillins Anaphylaxis   Drug Ingredient [Zinc]    Empagliflozin Rash    Outpatient Encounter Medications as of 05/31/2023  Medication Sig   acetaminophen (TYLENOL) 325 MG tablet Take 650 mg by mouth 3 (three) times daily.   acetaminophen (TYLENOL) 500 MG tablet Take 500 mg by mouth every 8 (eight) hours as needed.   amLODipine (NORVASC) 5 MG tablet Take 1 tablet (5 mg total) by mouth every evening. Skip the dose if SBP less than 140 mmHg   anastrozole (ARIMIDEX) 1 MG tablet Take 1 tablet (1 mg total) by mouth daily.   calcitRIOL (ROCALTROL) 0.25 MCG capsule Take 0.25 mcg by mouth as directed. Monday,Wednesday, Friday   calcium carbonate (OS-CAL - DOSED IN MG OF ELEMENTAL CALCIUM) 1250 (500 Ca) MG tablet Take 2 tablets by mouth daily with breakfast.   citalopram (CELEXA) 10 MG tablet Take 10 mg by mouth daily.    cyanocobalamin (VITAMIN B12) 1000 MCG tablet Take 1,000 mcg by mouth daily.   diclofenac Sodium (VOLTAREN) 1 % GEL Apply 2 g topically every 6 (six) hours. To bilateral legs.   docusate sodium (COLACE) 100 MG capsule Take 100 mg by mouth daily as needed for mild constipation.   DULoxetine (CYMBALTA) 60 MG capsule Take 60 mg by mouth daily.   gabapentin (NEURONTIN) 300 MG capsule Take 300 mg by mouth 2 (two) times daily.   hydrALAZINE (APRESOLINE) 50 MG tablet Take 1 tablet (50 mg total) by mouth 3 (three) times daily. Skip the dose if SBP less than 130 mmHg   loratadine (CLARITIN) 10 MG tablet Take 10 mg by mouth daily as needed for allergies. AM   melatonin 3 MG TABS tablet Take 3 mg by mouth at bedtime.   metoprolol succinate (TOPROL-XL) 25 MG 24 hr tablet Take 1 tablet (25 mg total) by mouth daily.   polyethylene glycol (MIRALAX / GLYCOLAX) 17 g packet Take 17 g by mouth  daily. Skip the dose if no constipation   simvastatin (ZOCOR) 20 MG tablet Take 20 mg by mouth every evening. PM   sodium bicarbonate 650 MG tablet Take 1 tablet (650 mg total) by mouth 3 (three) times daily.   valsartan (DIOVAN) 80 MG tablet Take 40 mg by mouth daily.   Vitamin D, Ergocalciferol, (DRISDOL) 1.25 MG (50000 UNIT) CAPS capsule Take 50,000 Units by mouth every 7 (seven) days.   Wheat Dextrin (BENEFIBER ON THE GO) PACK Take 1 packet by mouth daily as needed.   Zinc Oxide (TRIPLE PASTE) 12.8 % ointment Apply 1 Application topically. To buttocks every shift.   No facility-administered encounter medications on file as of 05/31/2023.    Review of Systems  Genitourinary:  Positive for frequency.    Immunization History  Administered Date(s) Administered   Influenza Inj Mdck Quad Pf 03/26/2016, 02/14/2018, 02/15/2019, 02/25/2021, 03/02/2022   Influenza Split 02/20/2015   Influenza, Seasonal, Injecte, Preservative Fre 04/13/2012, 03/17/2013   Influenza,inj,Quad PF,6+ Mos 03/07/2014, 02/20/2020   Influenza-Unspecified 03/26/2016, 02/04/2017, 02/14/2018, 02/25/2021, 04/12/2023   Moderna Sars-Covid-2 Vaccination  05/24/2019, 06/24/2019, 02/22/2020, 02/26/2022   PNEUMOCOCCAL CONJUGATE-20 04/09/2023   Pneumococcal Conjugate-13 09/01/2013   Pneumococcal Polysaccharide-23 04/13/2012   RSV,unspecified 04/09/2023   Td 08/07/2010   Tdap 08/07/2010   Zoster, Live 09/12/2007   Pertinent  Health Maintenance Due  Topic Date Due   FOOT EXAM  Never done   OPHTHALMOLOGY EXAM  Never done   HEMOGLOBIN A1C  10/11/2023   INFLUENZA VACCINE  Completed   DEXA SCAN  Completed      01/09/2022   10:55 AM 02/18/2022    1:36 PM 03/18/2022    4:19 PM 03/18/2022    4:46 PM 05/13/2022   10:17 AM  Fall Risk  (RETIRED) Patient Fall Risk Level High fall risk High fall risk Moderate fall risk Moderate fall risk High fall risk   Functional Status Survey:    There were no vitals filed for this  visit. There is no height or weight on file to calculate BMI. Physical Exam  Physical Exam Gen: Elderly, frail CV: RRR  PULM: Lungs clear ABDOMEN: No tenderness on palpation, nondistended Labs reviewed: Recent Labs    04/01/23 0519 04/12/23 0000 05/08/23 0402 05/09/23 0436 05/10/23 0210 05/11/23 0515 05/20/23 0000 05/27/23 0909  NA 138   < > 136 135 133* 134* 140 139  K 4.4   < > 3.7 3.8 4.0 3.9 4.6 3.9  CL 107   < > 108 106 105 109 105 101  CO2 21*   < > 19* 18* 19* 17* 25* 26  GLUCOSE 156*   < > 136* 119* 137* 137*  --  217*  BUN 39*   < > 31* 34* 39* 45* 28* 29*  CREATININE 1.58*   < > 1.31* 1.47* 1.56* 1.46* 1.2* 1.38*  CALCIUM 8.5*   < > 8.9 8.8* 8.6* 8.8* 9.6 9.4  MG 2.4  --  1.7 2.0  --   --   --   --   PHOS 3.0  --  3.3 3.4 2.9  --   --   --    < > = values in this interval not displayed.   Recent Labs    11/17/22 1304 02/22/23 1444 05/08/23 0402 05/09/23 0436 05/10/23 0210 05/27/23 0909  AST 27 24  --   --   --  23  ALT 17 14  --   --   --  15  ALKPHOS 81 62  --   --   --  66  BILITOT 0.6 0.4  --   --   --  0.6  PROT 7.8 7.5  --   --   --  7.1  ALBUMIN 4.2 4.1   < > 3.5 3.5 3.7   < > = values in this interval not displayed.   Recent Labs    05/06/23 1051 05/07/23 0614 05/10/23 0210 05/11/23 0515 05/20/23 0000 05/27/23 0909  WBC 7.7   < > 11.9* 7.5 8.7 8.9  NEUTROABS 4.8  --   --   --  5,220.00 5.5  HGB 11.1*   < > 10.4* 10.6* 10.7* 10.8*  HCT 33.2*   < > 30.0* 30.8* 33* 32.7*  MCV 94.1   < > 90.4 91.9  --  93.4  PLT 218   < > 190 199 249 223   < > = values in this interval not displayed.   Lab Results  Component Value Date   TSH 2.573 08/26/2020   Lab Results  Component Value Date   HGBA1C 7.3 04/12/2023  Lab Results  Component Value Date   CHOL 75 08/27/2022   HDL 22 (L) 08/27/2022   LDLCALC 34 08/27/2022   TRIG 146 05/08/2023   CHOLHDL 3.4 08/27/2022    Significant Diagnostic Results in last 30 days:  ECHOCARDIOGRAM  COMPLETE Result Date: 05/07/2023    ECHOCARDIOGRAM REPORT   Patient Name:   Tina Curtis Date of Exam: 05/07/2023 Medical Rec #:  [id number]     Height:       62.0 in Accession #:    [id number]    Weight:       138.2 lb Date of Birth:  11-16-1926    BSA:          1.634 m Patient Age:    89 years      BP:           142/63 mmHg Patient Gender: F             HR:           94 bpm. Exam Location:  [facility] Procedure: 2D Echo, Cardiac Doppler and Color Doppler Indications:     Stroke  History:         Patient has prior history of Echocardiogram examinations, most                  recent 08/27/2022. CHF, Previous Myocardial Infarction, Stroke;                  Signs/Symptoms:Chest Pain, Dizziness/Lightheadedness and                  Fatigue. CKD, breast CA.  Sonographer:     Mikki Harbor Referring Phys:  [id number] Ezequiel Essex Diagnosing Phys: Julien Nordmann MD  Sonographer Comments: Technically difficult study due to poor echo windows and suboptimal parasternal window. IMPRESSIONS  1. Left ventricular ejection fraction, by estimation, is 60 to 65%. The left ventricle has normal function. The left ventricle has no regional wall motion abnormalities. Left ventricular diastolic parameters are consistent with Grade I diastolic dysfunction (impaired relaxation).  2. Right ventricular systolic function is normal. The right ventricular size is normal. There is mildly elevated pulmonary artery systolic pressure. The estimated right ventricular systolic pressure is 41.9 mmHg.  3. The mitral valve is normal in structure. No evidence of mitral valve regurgitation. No evidence of mitral stenosis.  4. The aortic valve is normal in structure. There is mild calcification of the aortic valve. Aortic valve regurgitation is mild. Aortic valve sclerosis/calcification is present, without any evidence of aortic stenosis. Aortic valve mean gradient measures 8.0 mmHg.  5. The inferior vena cava is normal in size with greater than 50%  respiratory variability, suggesting right atrial pressure of 3 mmHg. FINDINGS  Left Ventricle: Left ventricular ejection fraction, by estimation, is 60 to 65%. The left ventricle has normal function. The left ventricle has no regional wall motion abnormalities. The left ventricular internal cavity size was normal in size. There is  no left ventricular hypertrophy. Left ventricular diastolic parameters are consistent with Grade I diastolic dysfunction (impaired relaxation). Right Ventricle: The right ventricular size is normal. No increase in right ventricular wall thickness. Right ventricular systolic function is normal. There is mildly elevated pulmonary artery systolic pressure. The tricuspid regurgitant velocity is 2.91  m/s, and with an assumed right atrial pressure of 8 mmHg, the estimated right ventricular systolic pressure is 41.9 mmHg. Left Atrium: Left atrial size was normal in size. Right Atrium: Right atrial size was normal  in size. Pericardium: There is no evidence of pericardial effusion. Mitral Valve: The mitral valve is normal in structure. There is mild calcification of the mitral valve leaflet(s). No evidence of mitral valve regurgitation. No evidence of mitral valve stenosis. MV peak gradient, 5.8 mmHg. The mean mitral valve gradient  is 3.0 mmHg. Tricuspid Valve: The tricuspid valve is normal in structure. Tricuspid valve regurgitation is mild . No evidence of tricuspid stenosis. Aortic Valve: The aortic valve is normal in structure. There is mild calcification of the aortic valve. Aortic valve regurgitation is mild. Aortic valve sclerosis/calcification is present, without any evidence of aortic stenosis. Aortic valve mean gradient measures 8.0 mmHg. Aortic valve peak gradient measures 17.1 mmHg. Aortic valve area, by VTI measures 2.43 cm. Pulmonic Valve: The pulmonic valve was normal in structure. Pulmonic valve regurgitation is not visualized. No evidence of pulmonic stenosis. Aorta: The aortic  root is normal in size and structure. Venous: The inferior vena cava is normal in size with greater than 50% respiratory variability, suggesting right atrial pressure of 3 mmHg. IAS/Shunts: No atrial level shunt detected by color flow Doppler.  LEFT VENTRICLE PLAX 2D LVIDd:         3.00 cm   Diastology LVIDs:         1.90 cm   LV e' medial:    5.55 cm/s LV PW:         0.80 cm   LV E/e' medial:  17.3 LV IVS:        1.40 cm   LV e' lateral:   5.98 cm/s LVOT diam:     1.90 cm   LV E/e' lateral: 16.0 LV SV:         87 LV SV Index:   53 LVOT Area:     2.84 cm  RIGHT VENTRICLE RV Basal diam:  3.00 cm RV Mid diam:    2.20 cm RV S prime:     20.40 cm/s TAPSE (M-mode): 2.6 cm LEFT ATRIUM             Index        RIGHT ATRIUM           Index LA diam:        2.90 cm 1.77 cm/m   RA Area:     11.90 cm LA Vol (A2C):   51.4 ml 31.46 ml/m  RA Volume:   23.70 ml  14.50 ml/m LA Vol (A4C):   36.5 ml 22.34 ml/m LA Biplane Vol: 45.8 ml 28.03 ml/m  AORTIC VALVE AV Area (Vmax):    2.08 cm AV Area (Vmean):   2.03 cm AV Area (VTI):     2.43 cm AV Vmax:           207.00 cm/s AV Vmean:          132.000 cm/s AV VTI:            0.357 m AV Peak Grad:      17.1 mmHg AV Mean Grad:      8.0 mmHg LVOT Vmax:         152.00 cm/s LVOT Vmean:        94.700 cm/s LVOT VTI:          0.306 m LVOT/AV VTI ratio: 0.86  AORTA Ao Root diam: 3.10 cm MITRAL VALVE                TRICUSPID VALVE MV Area (PHT): 3.23 cm     TR Peak grad:  33.9 mmHg MV Area VTI:   3.34 cm     TR Vmax:        291.00 cm/s MV Peak grad:  5.8 mmHg MV Mean grad:  3.0 mmHg     SHUNTS MV Vmax:       1.20 m/s     Systemic VTI:  0.31 m MV Vmean:      78.9 cm/s    Systemic Diam: 1.90 cm MV Decel Time: 235 msec MV E velocity: 95.90 cm/s MV A velocity: 134.00 cm/s MV E/A ratio:  0.72 Julien Nordmann MD Electronically signed by Julien Nordmann MD Signature Date/Time: 05/07/2023/3:01:11 PM    Final    CT HEAD WO CONTRAST ( ) Result Date: 05/06/2023 CLINICAL DATA:  Subdural hematoma  SDH expansion evaluation EXAM: CT HEAD WITHOUT CONTRAST TECHNIQUE: Contiguous axial images were obtained from the base of the skull through the vertex without intravenous contrast. RADIATION DOSE REDUCTION: This exam was performed according to the departmental dose-optimization program which includes automated exposure control, adjustment of the mA and/or kV according to patient size and/or use of iterative reconstruction technique. COMPARISON:  Same-day head CT FINDINGS: Brain: No significant change in size of the mixed density right cerebral convexity subdural hematoma measuring up to 2.9 cm in thickness. Unchanged 6 mm leftward midline shift. No new sites of hemorrhage. No CT evidence of an acute cortical infarct. No hydrocephalus. Unchanged 2 mm hyperdense focus along the septum pellucidum Vascular: No hyperdense vessel or unexpected calcification. Skull: Normal. Negative for fracture or focal lesion. Sinuses/Orbits: Small right mastoid effusion. No middle ear effusion. Paranasal sinuses are clear. Bilateral lens replacement. Orbits are otherwise unremarkable. Other: None. IMPRESSION: 1. No significant change in size of the mixed density right cerebral convexity subdural hematoma measuring up to 2.9 cm in thickness. Unchanged 6 mm leftward midline shift. 2. Unchanged 2 mm hyperdense focus along the septum pellucidum. Electronically Signed   By: Lorenza Cambridge M.D.   On: 05/06/2023 17:08   CT Head Wo Contrast Result Date: 05/06/2023 CLINICAL DATA:  Provided history: Head trauma, minor. Neck trauma. Additional history provided: Fall. EXAM: CT HEAD WITHOUT CONTRAST CT CERVICAL SPINE WITHOUT CONTRAST TECHNIQUE: Multidetector CT imaging of the head and cervical spine was performed following the standard protocol without intravenous contrast. Multiplanar CT image reconstructions of the cervical spine were also generated. RADIATION DOSE REDUCTION: This exam was performed according to the departmental  dose-optimization program which includes automated exposure control, adjustment of the mA and/or kV according to patient size and/or use of iterative reconstruction technique. COMPARISON:  Head CT 03/29/2023.  Cervical spine CT 03/29/2023. FINDINGS: CT HEAD FINDINGS Brain: Generalized cerebral atrophy. Mixed density subdural hematoma overlying the right cerebral hemisphere, measuring 2 cm in thickness. A significant portion of the hematoma is comprised of hyperdense acute components. There is mass effect upon the underlying right cerebral hemisphere with 5 mm leftward midline shift. 3 mm hyperdense focus along the septum pellucidum, new from the prior head CT of 03/29/2023. This could potentially reflect a small amount of hemorrhage within the septum pellucidum or within the left lateral ventricle (series 2, image 16) (series 4, image 33). Sequelae of mild chronic small vessel ischemic disease. Prominent perivascular space within the left basal ganglia inferiorly. No demarcated cortical infarct. No evidence of an intracranial mass. Vascular: No hyperdense vessel.  Atherosclerotic calcifications. Skull: No calvarial fracture or aggressive osseous lesion. Sinuses/Orbits: No mass or acute finding within the imaged orbits. No significant paranasal sinus disease at the imaged levels.  CT CERVICAL SPINE FINDINGS Alignment: 2 mm grade 1 anterolisthesis at C7-T1, T1-T2 and T2-T3. Skull base and vertebrae: The basion-dental and atlanto-dental intervals are maintained.No evidence of acute fracture to the cervical spine. 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 Soft tissues and spinal canal: No prevertebral fluid or swelling. No visible canal hematoma. Disc levels: Cervical spondylosis with multilevel disc space narrowing, disc bulges/central disc protrusions, posterior disc osteophyte complexes, uncovertebral hypertrophy and facet arthrosis. Disc space narrowing is greatest at C3-C4 (advanced), C5-C6 (moderate-to-advanced), C6-C7  (moderate-to-advanced) and T2-T3 (moderate-to-advanced). No appreciable high-grade spinal canal stenosis. Multilevel bony neural foraminal narrowing. Upper chest: No acute finding. CT head results were called by telephone at the time of interpretation on 05/06/2023 at 11:48 am to provider [facility] , who verbally acknowledged these results. IMPRESSION: CT head: 1. Mixed density subdural hematoma overlying the right cerebral hemisphere, measuring 2 cm in thickness. A significant portion of the hematoma is comprised of acute components. Mass effect upon the underlying right cerebral hemisphere with 5 mm leftward midline shift. 2. 3 mm hyperdense focus along the septum pellucidum, which could potentially reflect a small amount of hemorrhage within the septum pellucidum or within the left lateral ventricle. Attention recommended on imaging follow-up. CT cervical spine: 1. No evidence of an acute cervical spine fracture. 2. Mild grade 1 anterolisthesis at C7-T1, T1-T2 and T2-T3, unchanged from the prior cervical spine CT of 03/29/2023. 3. Cervical spondylosis as described. 4. 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 Electronically Signed   By: Jackey Loge D.O.   On: 05/06/2023 12:02   CT Cervical Spine Wo Contrast Result Date: 05/06/2023 CLINICAL DATA:  Provided history: Head trauma, minor. Neck trauma. Additional history provided: Fall. EXAM: CT HEAD WITHOUT CONTRAST CT CERVICAL SPINE WITHOUT CONTRAST TECHNIQUE: Multidetector CT imaging of the head and cervical spine was performed following the standard protocol without intravenous contrast. Multiplanar CT image reconstructions of the cervical spine were also generated. RADIATION DOSE REDUCTION: This exam was performed according to the departmental dose-optimization program which includes automated exposure control, adjustment of the mA and/or kV according to patient size and/or use of iterative reconstruction technique. COMPARISON:  Head CT 03/29/2023.  Cervical  spine CT 03/29/2023. FINDINGS: CT HEAD FINDINGS Brain: Generalized cerebral atrophy. Mixed density subdural hematoma overlying the right cerebral hemisphere, measuring 2 cm in thickness. A significant portion of the hematoma is comprised of hyperdense acute components. There is mass effect upon the underlying right cerebral hemisphere with 5 mm leftward midline shift. 3 mm hyperdense focus along the septum pellucidum, new from the prior head CT of 03/29/2023. This could potentially reflect a small amount of hemorrhage within the septum pellucidum or within the left lateral ventricle (series 2, image 16) (series 4, image 33). Sequelae of mild chronic small vessel ischemic disease. Prominent perivascular space within the left basal ganglia inferiorly. No demarcated cortical infarct. No evidence of an intracranial mass. Vascular: No hyperdense vessel.  Atherosclerotic calcifications. Skull: No calvarial fracture or aggressive osseous lesion. Sinuses/Orbits: No mass or acute finding within the imaged orbits. No significant paranasal sinus disease at the imaged levels. CT CERVICAL SPINE FINDINGS Alignment: 2 mm grade 1 anterolisthesis at C7-T1, T1-T2 and T2-T3. Skull base and vertebrae: The basion-dental and atlanto-dental intervals are maintained.No evidence of acute fracture to the cervical spine. 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 Soft tissues and spinal canal: No prevertebral fluid or swelling. No visible canal hematoma. Disc levels: Cervical spondylosis with multilevel disc space narrowing, disc bulges/central disc  protrusions, posterior disc osteophyte complexes, uncovertebral hypertrophy and facet arthrosis. Disc space narrowing is greatest at C3-C4 (advanced), C5-C6 (moderate-to-advanced), C6-C7 (moderate-to-advanced) and T2-T3 (moderate-to-advanced). No appreciable high-grade spinal canal stenosis. Multilevel bony neural foraminal narrowing. Upper chest: No acute finding. CT head results were called by  telephone at the time of interpretation on 05/06/2023 at 11:48 am to provider [facility] , who verbally acknowledged these results. IMPRESSION: CT head: 1. Mixed density subdural hematoma overlying the right cerebral hemisphere, measuring 2 cm in thickness. A significant portion of the hematoma is comprised of acute components. Mass effect upon the underlying right cerebral hemisphere with 5 mm leftward midline shift. 2. 3 mm hyperdense focus along the septum pellucidum, which could potentially reflect a small amount of hemorrhage within the septum pellucidum or within the left lateral ventricle. Attention recommended on imaging follow-up. CT cervical spine: 1. No evidence of an acute cervical spine fracture. 2. Mild grade 1 anterolisthesis at C7-T1, T1-T2 and T2-T3, unchanged from the prior cervical spine CT of 03/29/2023. 3. Cervical spondylosis as described. 4. 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 Electronically Signed   By: Jackey Loge D.O.   On: 05/06/2023 12:02   CT PELVIS WO CONTRAST Result Date: 05/06/2023 CLINICAL DATA:  Hip trauma, fracture suspected bruising of left leg EXAM: CT PELVIS WITHOUT CONTRAST TECHNIQUE: Multidetector CT imaging of the pelvis was performed following the standard protocol without intravenous contrast. RADIATION DOSE REDUCTION: This exam was performed according to the departmental dose-optimization program which includes automated exposure control, adjustment of the mA and/or kV according to patient size and/or use of iterative reconstruction technique. COMPARISON:  Radiograph 05/06/2023 and CT 08/22/2022 FINDINGS: Urinary Tract:  No acute abnormality. Bowel:  No acute abnormality. Vascular/Lymphatic: Arterial atherosclerotic calcification. 40 mm infrarenal abdominal aortic aneurysm. No lymphadenopathy. Reproductive:  Hysterectomy. Other:  No free intraperitoneal fluid or air. Musculoskeletal: Demineralization. No acute fracture or dislocation. IMPRESSION: 1. No acute  fracture or dislocation. 2. 40 mm infrarenal abdominal aortic aneurysm. Recommend follow-up CT or MR as appropriate in 12 months and referral to or continued care with vascular specialist. (Ref.: J Vasc Surg. 2018; 67:2-77 and J Am Coll Radiol 2013;10(10):789-794.) Electronically Signed   By: Minerva Fester M.D.   On: 05/06/2023 11:41   DG FEMUR MIN 2 VIEWS LEFT Result Date: 05/06/2023 CLINICAL DATA:  Fall, pain. EXAM: LEFT FEMUR 2 VIEWS COMPARISON:  None Available. FINDINGS: There is no evidence of fracture or other focal bone lesions. Soft tissues are unremarkable. IMPRESSION: Negative. Electronically Signed   By: Layla Maw M.D.   On: 05/06/2023 11:23   DG Pelvis 1-2 Views Result Date: 05/06/2023 CLINICAL DATA:  Fall, pain. EXAM: PELVIS - 1 VIEW COMPARISON:  None Available. FINDINGS: There is no evidence of pelvic fracture or diastasis. No pelvic bone lesions are seen. Lumbosacral degenerative changes. Bilateral sacroiliac osteophytes. Right-sided hip degenerative joint disease. IMPRESSION: Degenerative changes. Electronically Signed   By: Layla Maw M.D.   On: 05/06/2023 11:22   DG Knee Complete 4 Views Left Result Date: 05/06/2023 CLINICAL DATA:  injury.  Fall.  Painful to stand. EXAM: LEFT KNEE - COMPLETE 4+ VIEW COMPARISON:  None Available. FINDINGS: No acute fracture or dislocation. No aggressive osseous lesion. There are moderate degenerative changes of the knee joint. Meniscal chondrocalcinosis noted. No  focal soft tissue swelling. No radiopaque foreign bodies. IMPRESSION: *No acute osseous abnormality of the left knee joint. Moderate degenerative osteoarthritis. Electronically Signed   By: Jules Schick M.D.   On:  05/06/2023 10:17   Results RADIOLOGY Subdural Hematoma: 2.9 cm thickness, 6 mm leftward shift of the midline (05/06/2023)  Assessment/Plan Left-Sided Weakness Post-Subdural Hematoma Left-sided weakness following a subdural hematoma with a 2.9 cm thickness and 6  mm leftward midline shift noted on 05/06/2023. Undergoing therapy with slow improvement. Desires more frequent therapy sessions; concerned about insurance coverage. - Continue current therapy regimen - Follow up with neurosurgery on February 5th at 10:30 AM  Urinary Frequency Chronic urinary frequency, requiring nocturnal urination 2-3 times per night. No new changes in symptoms. Adequate hydration maintained. - Encourage adequate hydration  Irregular Bowel Movements Mixed stool consistency with both formed and loose stools, described as small balls with some liquid. Increased gas associated with bowel movements. No incontinence. Intermittent use of stool softener; not using Benefiber. - Order simethicone (Gas-X) for gas relief - Consider using Benefiber for stool bulking if diarrhea occurs  General Health Maintenance Engaged in physical therapy with slow improvement. Insurance coverage for therapy under appeal; crucial for continued progress. - Monitor insurance appeal status - Encourage continued participation in therapy - Consider writing a letter to support continued therapy coverage if needed  Follow-up - CT scan on January 30th at 10:30 AM - Neurosurgery follow-up on February 5th at 10:30 AM.

## 2023-06-10 ENCOUNTER — Ambulatory Visit
Admission: RE | Admit: 2023-06-10 | Discharge: 2023-06-10 | Disposition: A | Discharge status: Home / Self Care | Payer: Medicare PPO | Source: Ambulatory Visit | Attending: Neurosurgery | Admitting: Neurosurgery

## 2023-06-10 DIAGNOSIS — S065XAA Traumatic subdural hemorrhage with loss of consciousness status unknown, initial encounter: Secondary | ICD-10-CM | POA: Diagnosis present

## 2023-06-16 ENCOUNTER — Encounter: Payer: Self-pay | Admitting: Neurosurgery

## 2023-06-16 ENCOUNTER — Ambulatory Visit: Payer: Medicare PPO | Admitting: Neurosurgery

## 2023-06-16 VITALS — BP 142/78 | Ht 62.0 in | Wt 141.0 lb

## 2023-06-16 DIAGNOSIS — S065XAD Traumatic subdural hemorrhage with loss of consciousness status unknown, subsequent encounter: Secondary | ICD-10-CM | POA: Diagnosis not present

## 2023-06-16 DIAGNOSIS — W19XXXD Unspecified fall, subsequent encounter: Secondary | ICD-10-CM | POA: Diagnosis not present

## 2023-06-16 DIAGNOSIS — S065XAA Traumatic subdural hemorrhage with loss of consciousness status unknown, initial encounter: Secondary | ICD-10-CM

## 2023-06-16 NOTE — Progress Notes (Signed)
    HISTORY OF PRESENT ILLNESS: 06/16/2023 [name] is here today for follow-up from a right sided subdural hematoma.  She was seen in the hospital with a large right-sided subdural hematoma, discussion with both the patient and the family let us  to choose a noninterventional plan of care.  She is here today for follow-up with a repeat head CT.  PHYSICAL EXAMINATION:   Vitals:   06/16/23 1050  BP: (!) 142/78   General: Patient is well developed, well nourished, calm, collected, and in no apparent distress.  NEUROLOGICAL:  General: In no acute distress.  Awake, alert, oriented to person, place, and time. Pupils equal round and reactive to light.   Strength: She continues to have left-sided hemibody weakness as well as some very mild facial weakness noted on physical examination today.  ROS (Neurologic): Negative except as noted above  IMAGING: Repeat CT was performed on 06/10/2023.  Were waiting for final read.  He does not appear to have a severe level of expansion but the clot does appear to have matured as expected.  There is also formations of membranes.  ASSESSMENT/PLAN:  Tina Curtis is continuing to maintain conservative management of her large right sided subdural hematoma.  At this time it now appears to be mostly chronic with a change in radiographic density.  It has also developed some membranes owing to the chronicity.  She states that she does not have severe headaches, she gets them once in a while but feels that they are mostly related to her allergies.  She has had some left-sided weakness which she feels like has improved significantly with her work with physical therapy.  She is able to participate in her activities at her current residence center.  She continues to want to avoid surgical intervention at this time.  We talked about symptoms that would lead us  towards a surgical drainage,, but her and the family would like to avoid this if at all possible.  We remain  available for any follow-up, at this point she does not need any repeat head CT unless her symptoms worsen.  We do feel that she continues to have a significant functional improvement with physical and Occupational Therapy.  Given her continued improvement we would recommend that she continue with this regimen in order to avoid any worsening that may lead us  towards considering an intervention   Penne MICAEL Sharps, MD/MSCR Department of Neurosurgery

## 2023-06-17 ENCOUNTER — Ambulatory Visit: Payer: Medicare PPO | Admitting: Neurosurgery

## 2023-06-18 ENCOUNTER — Encounter: Payer: Self-pay | Admitting: Student

## 2023-06-18 ENCOUNTER — Non-Acute Institutional Stay (SKILLED_NURSING_FACILITY): Payer: Self-pay | Admitting: Student

## 2023-06-18 DIAGNOSIS — N184 Chronic kidney disease, stage 4 (severe): Secondary | ICD-10-CM

## 2023-06-18 DIAGNOSIS — E1122 Type 2 diabetes mellitus with diabetic chronic kidney disease: Secondary | ICD-10-CM

## 2023-06-18 DIAGNOSIS — L89892 Pressure ulcer of other site, stage 2: Secondary | ICD-10-CM | POA: Diagnosis not present

## 2023-06-18 DIAGNOSIS — I5032 Chronic diastolic (congestive) heart failure: Secondary | ICD-10-CM

## 2023-06-18 DIAGNOSIS — I10 Essential (primary) hypertension: Secondary | ICD-10-CM

## 2023-06-18 DIAGNOSIS — N1832 Chronic kidney disease, stage 3b: Secondary | ICD-10-CM

## 2023-06-18 NOTE — Progress Notes (Signed)
 Location:  [facility] Home Room Number: 505 A Place of Service:  SNF (31) Provider:  Richerd MYRTIS Brigham, M.D.  Patient Care Team: Diedra Lame, MD as PCP - General (Family Medicine) Rennie Cindy SAUNDERS, MD as Consulting Physician (Internal Medicine) Dessa Reyes ORN, MD as Consulting Physician (General Surgery) Dannielle Arlean FALCON, RN (Inactive) as Oncology Nurse Navigator Lenn Aran, MD as Referring Physician (Radiation Oncology)  Extended Emergency Contact Information Primary Emergency Contact: [facility] Phone: [phone] Relation: Daughter Secondary Emergency Contact: Hicklin,Roxanne Mobile Phone: [phone] Relation: Niece  Code Status:  DNR Goals of care: Advanced Directive information    05/27/2023    9:05 AM  Advanced Directives  Does Patient Have a Medical Advance Directive? Yes  Type of Estate Agent of [place];Living will     Chief Complaint  Patient presents with   Medical Management of Chronic Issues    Routine visit. Discuss need for shingrix, td/tdap, coivd booster, AWV, foot exam, and eye exam.     HPI:  Pt is a 89 y.o. female seen today for medical management of chronic diseases.   History of Present Illness The patient is a 89 year old with hypertension and dementia who presents with a wound for evaluation.  A wound on the left foot, specifically on the left medial proximal great toe, is the primary concern. The area began as a blister and is now dry and closed with fibrinous tissue, measuring 1 by 1 centimeter. There is no exudate or drainage. She is unsure how the skin tear occurred but speculates it might be due to rubbing against something while getting in and out of bed. She is currently in long-term care for assistance and therapy.  Hypertension is moderately controlled with current medications including amlodipine  160 mg daily, metoprolol  25 mg daily, and hydralazine  50 mg three times daily. Blood  pressure is recorded at 160/77 mmHg with a heart rate of 77 bpm. There is concern about elevated blood pressure.  There is soreness in the left ankle, attributed to issues with mobility in the left leg. A history of gout in the big toe occurred several years ago. She also mentions a history of wearing high-top lace-up shoes in the fourth grade due to foot problems and experiencing foot issues during college when she walked more.  A history of gout, particularly in the big toe, is noted, although it has been several years since the last episode. She is at risk for developing gout due to underlying kidney disease. The current foot issue does not appear to be an acute gout flare but rather a result of friction and reduced mobility.  General health maintenance includes being in long-term care and receiving regular podiatry care every three months. She wears support hose for leg support, although she had not put them on the morning of the visit. A history of a lumpectomy on the right arm is noted, and she wears a sleeve on that arm.  Past Medical History:  Diagnosis Date   Arthritis    Gout   Chronic kidney disease    Diarrhea    Hypercholesteremia    Hypertension    Neuropathy    feet and lower legs   Seasonal allergies    Skin cancer    face   Past Surgical History:  Procedure Laterality Date   ABDOMINAL HYSTERECTOMY  2000   APPENDECTOMY     BREAST BIOPSY Right 09/19/2019   affirm bx of calcs UOQ, x marker, path pending  BREAST BIOPSY Right 09/19/2019   us  bx of mass,heart marker, path pending   BREAST BIOPSY Right 09/19/2019   us  bx of LN, coil marker, path pending   CATARACT EXTRACTION Left    CATARACT EXTRACTION W/PHACO Right 10/24/2014   Procedure: CATARACT EXTRACTION PHACO AND INTRAOCULAR LENS PLACEMENT (IOC);  Surgeon: Dene Etienne, MD;  Location: [facility];  Service: Ophthalmology;  Laterality: Right;   ESOPHAGOGASTRODUODENOSCOPY N/A 03/07/2018   Procedure:  ESOPHAGOGASTRODUODENOSCOPY (EGD);  Surgeon: Unk Corinn Skiff, MD;  Location: [facility];  Service: Gastroenterology;  Laterality: N/A;   MASTECTOMY MODIFIED RADICAL Right 10/13/2019   Procedure: MASTECTOMY MODIFIED RADICAL;  Surgeon: Dessa Reyes ORN, MD;  Location: [facility] ORS;  Service: General;  Laterality: Right;   RENAL ANGIOGRAPHY Right 04/11/2018   Procedure: RENAL ANGIOGRAPHY;  Surgeon: Marea Selinda RAMAN, MD;  Location: [facility] INVASIVE CV LAB;  Service: Cardiovascular;  Laterality: Right;   SIMPLE MASTECTOMY WITH AXILLARY SENTINEL NODE BIOPSY Left 10/13/2019   Procedure: SIMPLE MASTECTOMY TRUE CUT BIOPSY, SENTINEL NODE BIOPSY;  Surgeon: Dessa Reyes ORN, MD;  Location: [facility] ORS;  Service: General;  Laterality: Left;    Allergies  Allergen Reactions   Penicillins Anaphylaxis   Drug Ingredient [Zinc ]    Empagliflozin  Rash    Outpatient Encounter Medications as of 06/18/2023  Medication Sig   acetaminophen  (TYLENOL ) 325 MG tablet Take 650 mg by mouth 3 (three) times daily.   acetaminophen  (TYLENOL ) 500 MG tablet Take 1,000 mg by mouth every 8 (eight) hours as needed.   amLODipine  (NORVASC ) 10 MG tablet Take 10 mg by mouth daily.   anastrozole  (ARIMIDEX ) 1 MG tablet Take 1 tablet (1 mg total) by mouth daily.   calcium  carbonate (OS-CAL - DOSED IN MG OF ELEMENTAL CALCIUM ) 1250 (500 Ca) MG tablet Take 2 tablets by mouth daily with breakfast.   citalopram  (CELEXA ) 10 MG tablet Take 10 mg by mouth daily.    cyanocobalamin  (VITAMIN B12) 1000 MCG tablet Take 1,000 mcg by mouth daily.   diclofenac  Sodium (VOLTAREN ) 1 % GEL Apply 2 g topically every 6 (six) hours. To bilateral legs.   docusate sodium  (COLACE) 100 MG capsule Take 100 mg by mouth daily as needed for mild constipation.   DULoxetine  (CYMBALTA ) 60 MG capsule Take 60 mg by mouth daily.   gabapentin  (NEURONTIN ) 300 MG capsule Take 300 mg by mouth 2 (two) times daily.   guaifenesin  (ROBITUSSIN) 100 MG/5ML syrup Take 200 mg by mouth every  4 (four) hours as needed for cough.   hydrALAZINE  (APRESOLINE ) 50 MG tablet Take 1 tablet (50 mg total) by mouth 3 (three) times daily. Skip the dose if SBP less than 130 mmHg   loratadine  (CLARITIN ) 10 MG tablet Take 10 mg by mouth daily as needed for allergies. AM   melatonin 3 MG TABS tablet Take 3 mg by mouth at bedtime.   metoprolol  succinate (TOPROL -XL) 25 MG 24 hr tablet Take 1 tablet (25 mg total) by mouth daily.   polyethylene glycol (MIRALAX  / GLYCOLAX ) 17 g packet Take 17 g by mouth daily. Skip the dose if no constipation   senna (SENOKOT) 8.6 MG TABS tablet Take 1 tablet by mouth every other day.   Simethicone (GAS-X PO) Take 1 capsule by mouth every 8 (eight) hours as needed (for gas).   simvastatin  (ZOCOR ) 20 MG tablet Take 20 mg by mouth every evening. PM   sodium bicarbonate  650 MG tablet Take 650 mg by mouth every 8 (eight) hours as needed for heartburn.  valsartan (DIOVAN) 160 MG tablet Take 160 mg by mouth daily.   Vitamin D , Ergocalciferol , (DRISDOL ) 1.25 MG (50000 UNIT) CAPS capsule Take 50,000 Units by mouth every 7 (seven) days.   Wheat Dextrin (BENEFIBER ON THE GO) PACK Take 1 packet by mouth daily as needed.   Zinc  Oxide (TRIPLE PASTE) 12.8 % ointment Apply 1 Application topically. To buttocks every shift.   [DISCONTINUED] amLODipine  (NORVASC ) 5 MG tablet Take 1 tablet (5 mg total) by mouth every evening. Skip the dose if SBP less than 140 mmHg (Patient not taking: Reported on 06/18/2023)   [DISCONTINUED] valsartan (DIOVAN) 80 MG tablet Take 40 mg by mouth daily. (Patient not taking: Reported on 06/18/2023)   No facility-administered encounter medications on file as of 06/18/2023.    Review of Systems  Immunization History  Administered Date(s) Administered   Influenza Inj Mdck Quad Pf 03/26/2016, 02/14/2018, 02/15/2019, 02/25/2021, 03/02/2022   Influenza Split 02/20/2015   Influenza, Seasonal, Injecte, Preservative Fre 04/13/2012, 03/17/2013   Influenza,inj,Quad PF,6+  Mos 03/07/2014, 02/20/2020   Influenza-Unspecified 03/26/2016, 02/04/2017, 02/14/2018, 02/25/2021, 04/12/2023   Moderna Sars-Covid-2 Vaccination 05/24/2019, 06/24/2019, 02/22/2020, 02/26/2022   PNEUMOCOCCAL CONJUGATE-20 04/09/2023   Pneumococcal Conjugate-13 09/01/2013   Pneumococcal Polysaccharide-23 04/13/2012   RSV,unspecified 04/09/2023   Td 08/07/2010   Tdap 08/07/2010   Zoster, Live 09/12/2007   Pertinent  Health Maintenance Due  Topic Date Due   FOOT EXAM  Never done   OPHTHALMOLOGY EXAM  Never done   HEMOGLOBIN A1C  10/11/2023   INFLUENZA VACCINE  Completed   DEXA SCAN  Completed      01/09/2022   10:55 AM 02/18/2022    1:36 PM 03/18/2022    4:19 PM 03/18/2022    4:46 PM 05/13/2022   10:17 AM  Fall Risk  (RETIRED) Patient Fall Risk Level High fall risk High fall risk Moderate fall risk Moderate fall risk High fall risk   Functional Status Survey:    Vitals:   06/18/23 0948 06/18/23 0952  BP: (!) 160/77 (!) 163/73  Pulse: 77   Weight: 142 lb (64.4 kg)   Height: 5' 2 (1.575 m)    Body mass index is 25.97 kg/m. Physical Exam Constitutional:      Appearance: Normal appearance.  Skin:    Comments: SKIN: Dry, closed, fibrinous lesion on left medial cuneiform, 1 by 1 cm, with no exudate drainage.  Neurological:     Mental Status: She is alert.     Labs reviewed: Recent Labs    04/01/23 0519 04/12/23 0000 05/08/23 0402 05/09/23 0436 05/10/23 0210 05/11/23 0515 05/20/23 0000 05/27/23 0909 05/31/23 0000  NA 138   < > 136 135 133* 134* 140 139 139  K 4.4   < > 3.7 3.8 4.0 3.9 4.6 3.9 4.0  CL 107   < > 108 106 105 109 105 101 105  CO2 21*   < > 19* 18* 19* 17* 25* 26 28*  GLUCOSE 156*   < > 136* 119* 137* 137*  --  217*  --   BUN 39*   < > 31* 34* 39* 45* 28* 29* 24*  CREATININE 1.58*   < > 1.31* 1.47* 1.56* 1.46* 1.2* 1.38* 1.4*  CALCIUM  8.5*   < > 8.9 8.8* 8.6* 8.8* 9.6 9.4 8.9  MG 2.4  --  1.7 2.0  --   --   --   --   --   PHOS 3.0  --  3.3 3.4 2.9   --   --   --   --    < > =  values in this interval not displayed.   Recent Labs    11/17/22 1304 02/22/23 1444 05/08/23 0402 05/09/23 0436 05/10/23 0210 05/27/23 0909  AST 27 24  --   --   --  23  ALT 17 14  --   --   --  15  ALKPHOS 81 62  --   --   --  66  BILITOT 0.6 0.4  --   --   --  0.6  PROT 7.8 7.5  --   --   --  7.1  ALBUMIN  4.2 4.1   < > 3.5 3.5 3.7   < > = values in this interval not displayed.   Recent Labs    05/06/23 1051 05/07/23 0614 05/10/23 0210 05/11/23 0515 05/20/23 0000 05/27/23 0909  WBC 7.7   < > 11.9* 7.5 8.7 8.9  NEUTROABS 4.8  --   --   --  5,220.00 5.5  HGB 11.1*   < > 10.4* 10.6* 10.7* 10.8*  HCT 33.2*   < > 30.0* 30.8* 33* 32.7*  MCV 94.1   < > 90.4 91.9  --  93.4  PLT 218   < > 190 199 249 223   < > = values in this interval not displayed.   Lab Results  Component Value Date   TSH 2.573 08/26/2020   Lab Results  Component Value Date   HGBA1C 7.3 04/12/2023   Lab Results  Component Value Date   CHOL 75 08/27/2022   HDL 22 (L) 08/27/2022   LDLCALC 34 08/27/2022   TRIG 146 05/08/2023   CHOLHDL 3.4 08/27/2022    Significant Diagnostic Results in last 30 days:  No results found.  Assessment/Plan Stage 2 Pressure Injury Wound on the left foot, initially a blister, now dry and closed with fibrinous tissue. No exudate or drainage present. Likely due to friction from reduced mobility in the left leg. Discussed the use of Santal to break down the fibrinous tissue and promote healing. Hx of Gout in the big toe, with current risk due to underlying kidney disease. No acute gout symptoms noted. Discussed the need for BMP and uric acid tests to monitor kidney function and gout risk. - Apply Santal to the wound - Redress the wound - collect BMP and Uric acid given hx of gout - OT evaluation for improved foot positioning  Hypertension Poorly controlled hypertension with current blood pressure reading of 160/77 mmHg. Currently on amlodipine   160 mg daily, metoprolol  25 mg daily, and hydralazine  50 mg TID. Plan to increase hydralazine  to 50 mg QID to improve blood pressure control. If no improvement, consider starting a diuretic, though this is less preferred due to the need for frequent lab checks and patient discomfort with frequent needle sticks. - Increase hydralazine  to 50 mg QID - Consider starting a diuretic if no improvement in blood pressure  General Health Maintenance Long-term care for assistance and therapy. Lumpectomy on the right side, wearing a sleeve on the right arm to prevent lymphedema. Regular podiatrist visits every three months. - Continue regular podiatrist visits every three months  Diabetes Collect A1c to evaluate patient's glucose levels.   Follow-up - Discuss wound care plan with nursing - Follow-up on BMP and uric acid results on Monday.  Family/ staff Communication: nursing  Labs/tests ordered:  CBC, BMP, A1c, uric acid.

## 2023-06-21 LAB — CBC: RBC: 3.31 — AB (ref 3.87–5.11)

## 2023-06-21 LAB — BASIC METABOLIC PANEL
BUN: 21 (ref 4–21)
BUN: 21 (ref 4–21)
CO2: 27 — AB (ref 13–22)
CO2: 27 — AB (ref 13–22)
Chloride: 105 (ref 99–108)
Chloride: 105 (ref 99–108)
Creatinine: 1.5 — AB (ref 0.5–1.1)
Creatinine: 1.5 — AB (ref 0.5–1.1)
Glucose: 139
Glucose: 139
Potassium: 3.9 meq/L (ref 3.5–5.1)
Potassium: 3.9 meq/L (ref 3.5–5.1)
Sodium: 140 (ref 137–147)
Sodium: 140 (ref 137–147)

## 2023-06-21 LAB — CBC AND DIFFERENTIAL
HCT: 31 — AB (ref 36–46)
Hemoglobin: 10.2 — AB (ref 12.0–16.0)
Neutrophils Absolute: 3862
Platelets: 246 10*3/uL (ref 150–400)
WBC: 7.1
WBC: 7.1

## 2023-06-21 LAB — COMPREHENSIVE METABOLIC PANEL
Calcium: 9.2 (ref 8.7–10.7)
Calcium: 9.2 (ref 8.7–10.7)
eGFR: 32

## 2023-06-21 LAB — HEMOGLOBIN A1C: Hemoglobin A1C: 7.7

## 2023-06-29 ENCOUNTER — Emergency Department: Payer: Medicare PPO

## 2023-06-29 ENCOUNTER — Inpatient Hospital Stay
Admission: EM | Admit: 2023-06-29 | Discharge: 2023-07-03 | DRG: 291 | Disposition: A | Discharge status: Skilled Nursing Facility | Payer: Medicare PPO | Source: Skilled Nursing Facility | Attending: Osteopathic Medicine | Admitting: Osteopathic Medicine

## 2023-06-29 ENCOUNTER — Other Ambulatory Visit: Payer: Self-pay

## 2023-06-29 ENCOUNTER — Encounter: Payer: Self-pay | Admitting: Emergency Medicine

## 2023-06-29 DIAGNOSIS — I130 Hypertensive heart and chronic kidney disease with heart failure and stage 1 through stage 4 chronic kidney disease, or unspecified chronic kidney disease: Secondary | ICD-10-CM | POA: Diagnosis not present

## 2023-06-29 DIAGNOSIS — E1129 Type 2 diabetes mellitus with other diabetic kidney complication: Secondary | ICD-10-CM | POA: Diagnosis present

## 2023-06-29 DIAGNOSIS — E1122 Type 2 diabetes mellitus with diabetic chronic kidney disease: Secondary | ICD-10-CM | POA: Diagnosis present

## 2023-06-29 DIAGNOSIS — Z9011 Acquired absence of right breast and nipple: Secondary | ICD-10-CM

## 2023-06-29 DIAGNOSIS — Z6825 Body mass index (BMI) 25.0-25.9, adult: Secondary | ICD-10-CM

## 2023-06-29 DIAGNOSIS — M25552 Pain in left hip: Secondary | ICD-10-CM | POA: Insufficient documentation

## 2023-06-29 DIAGNOSIS — F32A Depression, unspecified: Secondary | ICD-10-CM | POA: Diagnosis present

## 2023-06-29 DIAGNOSIS — M109 Gout, unspecified: Secondary | ICD-10-CM | POA: Diagnosis present

## 2023-06-29 DIAGNOSIS — I110 Hypertensive heart disease with heart failure: Principal | ICD-10-CM | POA: Diagnosis present

## 2023-06-29 DIAGNOSIS — I509 Heart failure, unspecified: Secondary | ICD-10-CM

## 2023-06-29 DIAGNOSIS — G8929 Other chronic pain: Secondary | ICD-10-CM | POA: Diagnosis present

## 2023-06-29 DIAGNOSIS — J90 Pleural effusion, not elsewhere classified: Secondary | ICD-10-CM | POA: Diagnosis not present

## 2023-06-29 DIAGNOSIS — Z880 Allergy status to penicillin: Secondary | ICD-10-CM

## 2023-06-29 DIAGNOSIS — Z8249 Family history of ischemic heart disease and other diseases of the circulatory system: Secondary | ICD-10-CM

## 2023-06-29 DIAGNOSIS — M545 Low back pain, unspecified: Secondary | ICD-10-CM | POA: Diagnosis present

## 2023-06-29 DIAGNOSIS — Z8673 Personal history of transient ischemic attack (TIA), and cerebral infarction without residual deficits: Secondary | ICD-10-CM

## 2023-06-29 DIAGNOSIS — R300 Dysuria: Secondary | ICD-10-CM | POA: Insufficient documentation

## 2023-06-29 DIAGNOSIS — N189 Chronic kidney disease, unspecified: Secondary | ICD-10-CM | POA: Diagnosis present

## 2023-06-29 DIAGNOSIS — E1140 Type 2 diabetes mellitus with diabetic neuropathy, unspecified: Secondary | ICD-10-CM | POA: Diagnosis present

## 2023-06-29 DIAGNOSIS — Z853 Personal history of malignant neoplasm of breast: Secondary | ICD-10-CM

## 2023-06-29 DIAGNOSIS — Z823 Family history of stroke: Secondary | ICD-10-CM

## 2023-06-29 DIAGNOSIS — J9601 Acute respiratory failure with hypoxia: Secondary | ICD-10-CM | POA: Diagnosis present

## 2023-06-29 DIAGNOSIS — D631 Anemia in chronic kidney disease: Secondary | ICD-10-CM | POA: Diagnosis present

## 2023-06-29 DIAGNOSIS — N184 Chronic kidney disease, stage 4 (severe): Secondary | ICD-10-CM | POA: Diagnosis present

## 2023-06-29 DIAGNOSIS — I129 Hypertensive chronic kidney disease with stage 1 through stage 4 chronic kidney disease, or unspecified chronic kidney disease: Secondary | ICD-10-CM | POA: Diagnosis present

## 2023-06-29 DIAGNOSIS — E7800 Pure hypercholesterolemia, unspecified: Secondary | ICD-10-CM | POA: Diagnosis present

## 2023-06-29 DIAGNOSIS — Z85828 Personal history of other malignant neoplasm of skin: Secondary | ICD-10-CM

## 2023-06-29 DIAGNOSIS — Z9012 Acquired absence of left breast and nipple: Secondary | ICD-10-CM

## 2023-06-29 DIAGNOSIS — I150 Renovascular hypertension: Secondary | ICD-10-CM | POA: Diagnosis present

## 2023-06-29 DIAGNOSIS — R0902 Hypoxemia: Principal | ICD-10-CM

## 2023-06-29 DIAGNOSIS — I5033 Acute on chronic diastolic (congestive) heart failure: Secondary | ICD-10-CM | POA: Diagnosis not present

## 2023-06-29 DIAGNOSIS — Z888 Allergy status to other drugs, medicaments and biological substances status: Secondary | ICD-10-CM

## 2023-06-29 DIAGNOSIS — Z79899 Other long term (current) drug therapy: Secondary | ICD-10-CM

## 2023-06-29 DIAGNOSIS — Z79811 Long term (current) use of aromatase inhibitors: Secondary | ICD-10-CM

## 2023-06-29 DIAGNOSIS — G2581 Restless legs syndrome: Secondary | ICD-10-CM | POA: Diagnosis present

## 2023-06-29 DIAGNOSIS — L89893 Pressure ulcer of other site, stage 3: Secondary | ICD-10-CM | POA: Diagnosis present

## 2023-06-29 DIAGNOSIS — I252 Old myocardial infarction: Secondary | ICD-10-CM

## 2023-06-29 DIAGNOSIS — E663 Overweight: Secondary | ICD-10-CM | POA: Diagnosis present

## 2023-06-29 DIAGNOSIS — Z803 Family history of malignant neoplasm of breast: Secondary | ICD-10-CM

## 2023-06-29 DIAGNOSIS — Z66 Do not resuscitate: Secondary | ICD-10-CM | POA: Diagnosis present

## 2023-06-29 DIAGNOSIS — L8990 Pressure ulcer of unspecified site, unspecified stage: Secondary | ICD-10-CM | POA: Insufficient documentation

## 2023-06-29 DIAGNOSIS — Z1152 Encounter for screening for COVID-19: Secondary | ICD-10-CM

## 2023-06-29 LAB — URINALYSIS, ROUTINE W REFLEX MICROSCOPIC
Bilirubin Urine: NEGATIVE
Glucose, UA: NEGATIVE mg/dL
Hgb urine dipstick: NEGATIVE
Ketones, ur: NEGATIVE mg/dL
Leukocytes,Ua: NEGATIVE
Nitrite: NEGATIVE
Protein, ur: NEGATIVE mg/dL
Specific Gravity, Urine: 1.013 (ref 1.005–1.030)
pH: 8 (ref 5.0–8.0)

## 2023-06-29 LAB — COMPREHENSIVE METABOLIC PANEL
ALT: 11 U/L (ref 0–44)
AST: 21 U/L (ref 15–41)
Albumin: 3.5 g/dL (ref 3.5–5.0)
Alkaline Phosphatase: 61 U/L (ref 38–126)
Anion gap: 10 (ref 5–15)
BUN: 27 mg/dL — ABNORMAL HIGH (ref 8–23)
CO2: 22 mmol/L (ref 22–32)
Calcium: 8.9 mg/dL (ref 8.9–10.3)
Chloride: 107 mmol/L (ref 98–111)
Creatinine, Ser: 1.54 mg/dL — ABNORMAL HIGH (ref 0.44–1.00)
GFR, Estimated: 31 mL/min — ABNORMAL LOW (ref >60)
Glucose, Bld: 162 mg/dL — ABNORMAL HIGH (ref 70–99)
Potassium: 4 mmol/L (ref 3.5–5.1)
Sodium: 139 mmol/L (ref 135–145)
Total Bilirubin: 0.7 mg/dL (ref 0.0–1.2)
Total Protein: 7 g/dL (ref 6.5–8.1)

## 2023-06-29 LAB — RESP PANEL BY RT-PCR (RSV, FLU A&B, COVID)  RVPGX2
Influenza A by PCR: NEGATIVE
Influenza B by PCR: NEGATIVE
Resp Syncytial Virus by PCR: NEGATIVE
SARS Coronavirus 2 by RT PCR: NEGATIVE

## 2023-06-29 LAB — CBC WITH DIFFERENTIAL/PLATELET
Abs Immature Granulocytes: 0.11 10*3/uL — ABNORMAL HIGH (ref 0.00–0.07)
Basophils Absolute: 0.1 10*3/uL (ref 0.0–0.1)
Basophils Relative: 1 %
Eosinophils Absolute: 0.2 10*3/uL (ref 0.0–0.5)
Eosinophils Relative: 2 %
HCT: 29.8 % — ABNORMAL LOW (ref 36.0–46.0)
Hemoglobin: 10 g/dL — ABNORMAL LOW (ref 12.0–15.0)
Immature Granulocytes: 1 %
Lymphocytes Relative: 20 %
Lymphs Abs: 2.1 10*3/uL (ref 0.7–4.0)
MCH: 31 pg (ref 26.0–34.0)
MCHC: 33.6 g/dL (ref 30.0–36.0)
MCV: 92.3 fL (ref 80.0–100.0)
Monocytes Absolute: 1.1 10*3/uL — ABNORMAL HIGH (ref 0.1–1.0)
Monocytes Relative: 10 %
Neutro Abs: 6.9 10*3/uL (ref 1.7–7.7)
Neutrophils Relative %: 66 %
Platelets: 234 10*3/uL (ref 150–400)
RBC: 3.23 MIL/uL — ABNORMAL LOW (ref 3.87–5.11)
RDW: 13.4 % (ref 11.5–15.5)
WBC: 10.4 10*3/uL (ref 4.0–10.5)
nRBC: 0 % (ref 0.0–0.2)

## 2023-06-29 LAB — PROTIME-INR
INR: 1.1 (ref 0.8–1.2)
Prothrombin Time: 14.1 s (ref 11.4–15.2)

## 2023-06-29 LAB — LACTIC ACID, PLASMA
Lactic Acid, Venous: 1.2 mmol/L (ref 0.5–1.9)
Lactic Acid, Venous: 0.9 mmol/L (ref 0.5–1.9)

## 2023-06-29 LAB — TROPONIN I (HIGH SENSITIVITY)
Troponin I (High Sensitivity): 24 ng/L — ABNORMAL HIGH (ref <18)
Troponin I (High Sensitivity): 27 ng/L — ABNORMAL HIGH (ref <18)

## 2023-06-29 LAB — BRAIN NATRIURETIC PEPTIDE: B Natriuretic Peptide: 680 pg/mL — ABNORMAL HIGH (ref 0.0–100.0)

## 2023-06-29 LAB — APTT: aPTT: 37 s — ABNORMAL HIGH (ref 24–36)

## 2023-06-29 MED ORDER — GABAPENTIN 300 MG PO CAPS
300.0000 mg | ORAL_CAPSULE | Freq: Two times a day (BID) | ORAL | Status: DC
  Administered 2023-06-29 – 2023-07-03 (×8): 300 mg via ORAL
  Filled 2023-06-29 (×8): qty 1

## 2023-06-29 MED ORDER — CALCIUM CARBONATE 1250 (500 CA) MG PO TABS
2.0000 | ORAL_TABLET | Freq: Every day | ORAL | Status: DC
  Administered 2023-06-30 – 2023-07-03 (×4): 2500 mg via ORAL
  Filled 2023-06-29 (×4): qty 2

## 2023-06-29 MED ORDER — LORATADINE 10 MG PO TABS: 10.0000 mg | ORAL_TABLET | Freq: Every day | ORAL | Status: DC | PRN

## 2023-06-29 MED ORDER — MORPHINE SULFATE (PF) 2 MG/ML IV SOLN
2.0000 mg | Freq: Once | INTRAVENOUS | Status: AC
  Administered 2023-06-29: 2 mg via INTRAVENOUS
  Filled 2023-06-29: qty 1

## 2023-06-29 MED ORDER — SODIUM CHLORIDE 0.9 % IV SOLN
1.0000 g | INTRAVENOUS | Status: DC
  Administered 2023-06-29 – 2023-07-02 (×4): 1 g via INTRAVENOUS
  Filled 2023-06-29 (×5): qty 10

## 2023-06-29 MED ORDER — POLYETHYLENE GLYCOL 3350 17 G PO PACK
17.0000 g | PACK | ORAL | Status: DC
  Filled 2023-06-29 (×2): qty 1

## 2023-06-29 MED ORDER — ACETAMINOPHEN 650 MG RE SUPP: 650.0000 mg | Freq: Four times a day (QID) | RECTAL | Status: DC | PRN

## 2023-06-29 MED ORDER — NITROFURANTOIN MONOHYD MACRO 100 MG PO CAPS: 100.0000 mg | ORAL_CAPSULE | Freq: Two times a day (BID) | ORAL | Status: DC

## 2023-06-29 MED ORDER — IRBESARTAN 150 MG PO TABS
150.0000 mg | ORAL_TABLET | Freq: Every day | ORAL | Status: DC
  Administered 2023-06-30 – 2023-07-03 (×4): 150 mg via ORAL
  Filled 2023-06-29 (×4): qty 1

## 2023-06-29 MED ORDER — CITALOPRAM HYDROBROMIDE 20 MG PO TABS
10.0000 mg | ORAL_TABLET | Freq: Every day | ORAL | Status: DC
  Administered 2023-06-29 – 2023-07-03 (×5): 10 mg via ORAL
  Filled 2023-06-29 (×5): qty 1

## 2023-06-29 MED ORDER — MORPHINE SULFATE (PF) 4 MG/ML IV SOLN: 4.0000 mg | INTRAVENOUS | Status: AC | PRN

## 2023-06-29 MED ORDER — GUAIFENESIN 100 MG/5ML PO LIQD: 200.0000 mg | ORAL | Status: DC | PRN

## 2023-06-29 MED ORDER — AMLODIPINE BESYLATE 10 MG PO TABS
10.0000 mg | ORAL_TABLET | Freq: Every day | ORAL | Status: DC
  Administered 2023-06-29 – 2023-07-02 (×4): 10 mg via ORAL
  Filled 2023-06-29 (×4): qty 1

## 2023-06-29 MED ORDER — VITAMIN B-12 1000 MCG PO TABS
1000.0000 ug | ORAL_TABLET | Freq: Every day | ORAL | Status: DC
  Administered 2023-06-30 – 2023-07-03 (×4): 1000 ug via ORAL
  Filled 2023-06-29 (×4): qty 1

## 2023-06-29 MED ORDER — DULOXETINE HCL 30 MG PO CPEP
60.0000 mg | ORAL_CAPSULE | Freq: Every day | ORAL | Status: DC
  Administered 2023-06-29 – 2023-07-03 (×5): 60 mg via ORAL
  Filled 2023-06-29: qty 1
  Filled 2023-06-29 (×4): qty 2

## 2023-06-29 MED ORDER — HYDRALAZINE HCL 10 MG PO TABS: 10.0000 mg | ORAL_TABLET | Freq: Four times a day (QID) | ORAL | Status: AC | PRN

## 2023-06-29 MED ORDER — FUROSEMIDE 10 MG/ML IJ SOLN
60.0000 mg | Freq: Once | INTRAMUSCULAR | Status: AC
  Administered 2023-06-29: 60 mg via INTRAVENOUS
  Filled 2023-06-29: qty 8

## 2023-06-29 MED ORDER — SIMVASTATIN 20 MG PO TABS
20.0000 mg | ORAL_TABLET | Freq: Every evening | ORAL | Status: DC
  Administered 2023-06-29 – 2023-07-02 (×4): 20 mg via ORAL
  Filled 2023-06-29: qty 1
  Filled 2023-06-29: qty 2
  Filled 2023-06-29 (×2): qty 1

## 2023-06-29 MED ORDER — IOHEXOL 350 MG/ML SOLN
50.0000 mL | Freq: Once | INTRAVENOUS | Status: AC | PRN
  Administered 2023-06-29: 50 mL via INTRAVENOUS

## 2023-06-29 MED ORDER — OXYCODONE-ACETAMINOPHEN 5-325 MG PO TABS
1.0000 | ORAL_TABLET | Freq: Four times a day (QID) | ORAL | Status: AC | PRN
  Administered 2023-06-29: 1 via ORAL
  Filled 2023-06-29: qty 1

## 2023-06-29 MED ORDER — ACETAMINOPHEN 325 MG PO TABS
650.0000 mg | ORAL_TABLET | Freq: Four times a day (QID) | ORAL | Status: DC | PRN
  Administered 2023-07-01 – 2023-07-03 (×4): 650 mg via ORAL
  Filled 2023-06-29 (×4): qty 2

## 2023-06-29 MED ORDER — METOPROLOL SUCCINATE ER 25 MG PO TB24
25.0000 mg | ORAL_TABLET | Freq: Every day | ORAL | Status: DC
  Administered 2023-06-30 – 2023-07-03 (×4): 25 mg via ORAL
  Filled 2023-06-29 (×4): qty 1

## 2023-06-29 MED ORDER — SENNA 8.6 MG PO TABS
1.0000 | ORAL_TABLET | ORAL | Status: DC
  Administered 2023-06-29 – 2023-07-03 (×3): 8.6 mg via ORAL
  Filled 2023-06-29 (×3): qty 1

## 2023-06-29 MED ORDER — MELATONIN 5 MG PO TABS
2.5000 mg | ORAL_TABLET | Freq: Every day | ORAL | Status: DC
  Administered 2023-06-29 – 2023-07-02 (×4): 2.5 mg via ORAL
  Filled 2023-06-29 (×4): qty 1

## 2023-06-29 MED ORDER — ONDANSETRON HCL 4 MG PO TABS: 4.0000 mg | ORAL_TABLET | Freq: Four times a day (QID) | ORAL | Status: DC | PRN

## 2023-06-29 MED ORDER — SODIUM BICARBONATE 650 MG PO TABS: 650.0000 mg | ORAL_TABLET | Freq: Three times a day (TID) | ORAL | Status: DC | PRN

## 2023-06-29 MED ORDER — ONDANSETRON HCL 4 MG/2ML IJ SOLN: 4.0000 mg | Freq: Four times a day (QID) | INTRAMUSCULAR | Status: DC | PRN

## 2023-06-29 MED ORDER — ANASTROZOLE 1 MG PO TABS
1.0000 mg | ORAL_TABLET | Freq: Every day | ORAL | Status: DC
  Administered 2023-06-29 – 2023-07-03 (×5): 1 mg via ORAL
  Filled 2023-06-29 (×5): qty 1

## 2023-06-29 MED ORDER — DICLOFENAC SODIUM 1 % EX GEL
2.0000 g | Freq: Every day | CUTANEOUS | Status: DC
  Administered 2023-06-29 – 2023-07-02 (×4): 2 g via TOPICAL
  Filled 2023-06-29 (×2): qty 100

## 2023-06-29 MED ORDER — FUROSEMIDE 10 MG/ML IJ SOLN
40.0000 mg | Freq: Two times a day (BID) | INTRAMUSCULAR | Status: AC
  Administered 2023-06-30 (×2): 40 mg via INTRAVENOUS
  Filled 2023-06-29 (×2): qty 4

## 2023-06-29 NOTE — Assessment & Plan Note (Signed)
Home anastrozole 1 mg daily resumed

## 2023-06-29 NOTE — H&P (Addendum)
 History and Physical   Tina Curtis YNW:[id number] [name]: July 05, 1926 [name]: 06/29/2023  PCP: [name], MD  Outpatient Specialists: Dr. Donneta Romberg, medical oncology Patient coming from: [facility] via EMS  I have personally briefly reviewed patient's old medical records in [facility] EMR.  Chief Concern: Shortness of and fall  HPI: [name] is a 89 year old female with history of hypertension, depression, anxiety, history of NSTEMI, diastolic heart failure, metastatic bone cancer, history of stroke on dual antiplatelet therapy, who presents to the emergency department for chief concerns of shortness of breath.  Patient reports that she slid out of her chair and landed on her left hip on Saturday, 06/26/2023.  Vitals in the ED showed temperature of 98, respiration rate of 18, heart rate of 90, blood pressure 119/100, SpO2 of 93% on 6 L nasal cannula.  Serum sodium is 139, potassium 4.0, chloride 107, bicarb 22, BUN of 27, serum creatinine 1.54, EGFR 31, nonfasting blood glucose 162, WBC 10.4, hemoglobin 10.0, platelets of 234.  BNP 680.  High sensitive troponin was initially 24 and on repeat is 27.  COVID/influenza A/influenza B/RSV PCR were negative.  Lactic acid were negative x 2.  Blood cultures x 2 are in process.  ED treatment: Morphine 2 mg IV 2 treatments, furosemide 60 mg IV one-time dose. ------------------------------- At bedside, patient is able to tell me her first name, age, current location, current calendar year.  She reports that has been short of breath, for about 4 days. She denies cough, chest pain, fever, chills, nausea, vomiting, blood in urine or stool.  She endorses swelling of both her legs. She endorses dysuria and loose stool.  Patient reports she has been having burning sensation and increased urination for several days.  She has had a lot of stress lately because her youngest daughter, who is 55 has had a kidney transplant and was recently admitted  to hospital and currently is on a ventilator in [place]. She states she is very worried for her daugther.  Of note, 4 days ago she slid down from her chair and onto her bottom.  She denies hitting her head, loss of consciousness or any other head or neck injury.  She reports she was able to get up from there.  Social history: Patient is from [facility]. She is widowed for 5 years. She was married for 70 years. She denies tobacco, etoh, and recreational drug use. 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  ROS: Constitutional: no weight change, no fever ENT/Mouth: no sore throat, no rhinorrhea Eyes: no eye pain, no vision changes Cardiovascular: no chest pain, + dyspnea,  no edema, no palpitations Respiratory: no cough, no sputum, no wheezing Gastrointestinal: no nausea, no vomiting, no diarrhea, + loose stool, no constipation Genitourinary: no urinary incontinence, + dysuria, no hematuria Musculoskeletal: no arthralgias, no myalgias Skin: no skin lesions, no pruritus, Neuro: + weakness, no loss of consciousness, no syncope Psych: no anxiety, no depression, + decrease appetite Heme/Lymph: no bruising, no bleeding  ED Course: Discussed with EDP, patient requiring hospitalization for chief concerns of heart failure exacerbation.  Assessment/Plan  Principal Problem:   Acute exacerbation of CHF (congestive heart failure) (HCC) Active Problems:   Depression   Type II diabetes mellitus with renal manifestations (HCC)   Pure hypercholesterolemia   RLS (restless legs syndrome)   Renovascular hypertension   Anemia in chronic kidney disease   Benign hypertensive kidney  disease with chronic kidney disease   History of breast cancer   Left hip pain   Dysuria   Assessment and Plan:  * Acute exacerbation of CHF (congestive heart failure) (HCC) Strict I's and O's Status post furosemide 60 mg IV per EDP On  admission have ordered furosemide 40 mg IV twice daily, 2 doses ordered for 06/30/2023 Patient had echo done on 05/07/2023 which was read as estimated ejection fraction is 60 to 65%, grade 1 diastolic dysfunction A.m. team to consult cardiology as appropriate for heart failure medication optimization  Depression Home citalopram 10 mg daily, duloxetine 60 mg daily resumed  Dysuria With polyuria, increased urgency I suspect patient has a urinary tract infection A UA has been ordered and pending collection at this time Ceftriaxone 1 g IV daily has been ordered on admission; per pharmacy, patient has tolerated cephalosporin in the past  History of breast cancer Home anastrozole 1 mg daily resumed  Anemia in chronic kidney disease At baseline  Renovascular hypertension Home amlodipine 10 mg nightly, irbesartan 150 mg daily, metoprolol succinate 25 mg daily were resumed on admission Scheduled home hydralazine 50 mg 3 times daily with instructions to skip if SBP is less than 130 has not been resumed on admission Hydralazine 10 mg p.o. every 6 hours as needed for SBP greater 165, 4 days ordered on admission  RLS (restless legs syndrome) Home gabapentin 300 mg p.o. twice daily resumed  Chart reviewed.   DVT prophylaxis: TED hose; pharmacologic DVT prophylaxis not initiated on admission as patient had a subdural hematoma 2 weeks ago Code Status: DNR/DNI; MOST form reviewed in ACP Diet: N.p.o. pending swallow screening Family Communication: Daysi Boggan was  Patient requested daughter, Tina Curtis to be called Disposition Plan: Pending clinical course; guarded prognosis Consults called: PT, OT Admission status: Telemetry cardiac, observation  Past Medical History:  Diagnosis Date   Arthritis    Gout   Chronic kidney disease    Diarrhea    Hypercholesteremia    Hypertension    Neuropathy    feet and lower legs   Seasonal allergies    Skin cancer    face   Past Surgical History:   Procedure Laterality Date   ABDOMINAL HYSTERECTOMY  2000   APPENDECTOMY     BREAST BIOPSY Right 09/19/2019   affirm bx of calcs UOQ, x marker, path pending   BREAST BIOPSY Right 09/19/2019   [place] bx of mass,heart marker, path pending   BREAST BIOPSY Right 09/19/2019   [place] bx of LN, coil marker, path pending   CATARACT EXTRACTION Left    CATARACT EXTRACTION W/PHACO Right 10/24/2014   Procedure: CATARACT EXTRACTION PHACO AND INTRAOCULAR LENS PLACEMENT (IOC);  Surgeon: Lockie Mola, MD;  Location: [facility];  Service: Ophthalmology;  Laterality: Right;   ESOPHAGOGASTRODUODENOSCOPY N/A 03/07/2018   Procedure: ESOPHAGOGASTRODUODENOSCOPY (EGD);  Surgeon: Toney Reil, MD;  Location[facility] END[facility];  Service: Gastroenterology;  Laterality: N/A;   MASTECTOMY MODIFIED RADICAL Right 10/13/2019   Procedure: MASTECTOMY MODIFIED RADICAL;  Surgeon: Earline Mayotte, MD;  Location: [facility] ORS;  Service: General;  Laterality: Right;   RENAL ANGIOGRAPHY Right 04/11/2018   Procedure: RENAL ANGIOGRAPHY;  Surgeon: Annice Needy, MD;  Location: [facility] INVASIVE CV LAB;  Service: Cardiovascular;  Laterality: Right;   SIMPLE MASTECTOMY WITH AXILLARY SENTINEL NODE BIOPSY Left 10/13/2019   Procedure: SIMPLE MASTECTOMY TRUE CUT BIOPSY, SENTINEL NODE BIOPSY;  Surgeon: Earline Mayotte, MD;  Location: [facility] ORS;  Service: General;  Laterality: Left;  Social History:  reports that she has never smoked. She has never used smokeless tobacco. She reports that she does not drink alcohol and does not use drugs.  Allergies  Allergen Reactions   Penicillins Anaphylaxis    Patient tolerates ceftriaxone   Drug Ingredient [Zinc]    Empagliflozin Rash   Family History  Problem Relation Age of Onset   Hypertension Mother    Hypertension Father    Stroke Father    Breast cancer Daughter 1   Family history: Family history reviewed and not pertinent.  Prior to Admission medications   Medication Sig Start  Date End Date Taking? Authorizing Provider  acetaminophen (TYLENOL) 325 MG tablet Take 650 mg by mouth 3 (three) times daily.   Yes [provider]  acetaminophen (TYLENOL) 500 MG tablet Take 1,000 mg by mouth every 8 (eight) hours as needed.   Yes [provider]  allopurinol (ZYLOPRIM) 100 MG tablet Take 100 mg by mouth daily.   Yes [provider]  amLODipine (NORVASC) 10 MG tablet Take 10 mg by mouth at bedtime.   Yes [provider]  anastrozole (ARIMIDEX) 1 MG tablet Take 1 tablet (1 mg total) by mouth daily. 12/24/22  Yes Earna Coder, MD  calcium carbonate (OS-CAL - DOSED IN MG OF ELEMENTAL CALCIUM) 1250 (500 Ca) MG tablet Take 2 tablets by mouth daily with breakfast.   Yes [provider]  citalopram (CELEXA) 10 MG tablet Take 10 mg by mouth daily.  03/14/18  Yes [provider]  cyanocobalamin (VITAMIN B12) 1000 MCG tablet Take 1,000 mcg by mouth daily.   Yes [provider]  diclofenac Sodium (VOLTAREN) 1 % GEL Apply 2 g topically at bedtime. To bilateral legs.   Yes [provider]  docusate sodium (COLACE) 100 MG capsule Take 100 mg by mouth daily as needed for mild constipation.   Yes [provider]  DULoxetine (CYMBALTA) 60 MG capsule Take 60 mg by mouth daily. 11/11/18  Yes [provider]  gabapentin (NEURONTIN) 300 MG capsule Take 300 mg by mouth 2 (two) times daily.   Yes [provider]  guaifenesin (ROBITUSSIN) 100 MG/5ML syrup Take 200 mg by mouth every 4 (four) hours as needed for cough.   Yes [provider]  hydrALAZINE (APRESOLINE) 50 MG tablet Take 1 tablet (50 mg total) by mouth 3 (three) times daily. Skip the dose if SBP less than 130 mmHg 04/02/23 04/01/24 Yes Gillis Santa, MD  loratadine (CLARITIN) 10 MG tablet Take 10 mg by mouth daily as needed for allergies. AM   Yes [provider]  melatonin 3 MG TABS tablet Take 3 mg by mouth at bedtime.    Yes [provider]  metoprolol succinate (TOPROL-XL) 25 MG 24 hr tablet Take 1 tablet (25 mg total) by mouth daily. 05/11/23 05/10/24 Yes Gillis Santa, MD  polyethylene glycol (MIRALAX / GLYCOLAX) 17 g packet Take 17 g by mouth daily. Skip the dose if no constipation Patient taking differently: Take 17 g by mouth daily. On Monday , Wednesday and Friday's 05/11/23  Yes Gillis Santa, MD  SANTYL 250 UNIT/GM ointment Apply 1 Application topically daily. apply to left medial cuneiform every day 06/18/23  Yes [provider]  senna (SENOKOT) 8.6 MG TABS tablet Take 1 tablet by mouth every other day.   Yes [provider]  Simethicone (GAS-X PO) Take 1 capsule by mouth every 8 (eight) hours as needed (for gas).   Yes [provider]  simvastatin (ZOCOR) 20 MG tablet Take 20 mg by mouth every evening. PM   Yes [provider]  sodium bicarbonate 650 MG tablet Take 650 mg by mouth every 8 (eight) hours as needed for heartburn.   Yes [provider]  valsartan (DIOVAN) 160 MG tablet Take 160 mg by mouth daily.   Yes [provider]  Vitamin D, Ergocalciferol, (DRISDOL) 1.25 MG (50000 UNIT) CAPS capsule Take 50,000 Units by mouth every 7 (seven) days.   Yes [provider]  Wheat Dextrin (BENEFIBER ON THE GO) PACK Take 1 packet by mouth daily as needed.   Yes [provider]  Zinc Oxide (TRIPLE PASTE) 12.8 % ointment Apply 1 Application topically. To buttocks every shift. Patient not taking: Reported on 06/29/2023    [provider]   Physical Exam: Vitals:   06/29/23 0841 06/29/23 0906 06/29/23 1241 06/29/23 1313  BP: (!) 119/100 131/65    Pulse: 90  84   Resp: 18  20   Temp: 99 F (37.2 C)   97.9 F (36.6 C)  TempSrc: Oral   Oral  SpO2: 93%  95%   Weight:      Height:       Constitutional: appears younger than chronological, NAD, calm Eyes: PERRL, lids and conjunctivae normal ENMT: Mucous membranes are  moist. Posterior pharynx clear of any exudate or lesions. Age-appropriate dentition. Hearing appropriate Neck: normal, supple, no masses, no thyromegaly Respiratory: Generalized decreased lung sounds to auscultation bilaterally. No wheezing, no crackles. Normal respiratory effort. No accessory muscle use.  Cardiovascular: Regular rate and rhythm, no murmurs / rubs / gallops. Bilateral swelling of lower extremity, 2+ pitting. 2+ pedal pulses. No carotid bruits.  Abdomen: no tenderness, no masses palpated, no hepatosplenomegaly. Bowel sounds positive.  Musculoskeletal: no clubbing / cyanosis. No joint deformity upper and lower extremities. Good ROM, no contractures, no atrophy. Normal muscle tone.  Skin: no rashes, lesions, ulcers. No induration Neurologic: Sensation intact. Strength 5/5 in all 4.  Psychiatric: Normal judgment and insight. Alert and oriented x 3. Normal mood.   EKG: independently reviewed, showing sinus rhythm with rate of 88, QTc 467  Chest x-ray on Admission: I personally reviewed and I agree with radiologist reading as below.  DG Hip Unilat W or Wo Pelvis 2-3 Views Left Result Date: 06/29/2023 CLINICAL DATA:  Left hip pain after a fall a few days ago EXAM: DG HIP (WITH OR WITHOUT PELVIS) 2-3V LEFT COMPARISON:  05/06/2023 FINDINGS: AP view of the pelvis and AP/frog leg views of the left hip. Femoral heads are located. Degenerative sclerosis of the left sacroiliac joint. No acute fracture. IMPRESSION: No acute osseous abnormality. Electronically Signed   By: Jeronimo Greaves M.D.   On: 06/29/2023 10:26   DG Chest Port 1 View Result Date: 06/29/2023 CLINICAL DATA:  Short of breath and hypoxic. EXAM: PORTABLE CHEST 1 VIEW COMPARISON:  08/26/2022 FINDINGS: Surgical clips project over the right axilla. Patient is rotated to the right. Cardiomegaly accentuated by AP portable technique. Atherosclerosis in the transverse aorta. Probable small right pleural effusion. No pneumothorax.  Progressive interstitial prominence and indistinctness. Similar left and increased right base airspace disease. IMPRESSION: Moderate congestive heart failure. New or increased small right pleural effusion. Persistent left and increased right lower lung airspace disease. Most likely atelectasis. Cannot exclude concurrent pneumonia or aspiration. Electronically Signed   By: Jeronimo Greaves M.D.   On: 06/29/2023 10:23   Labs on Admission: I have personally reviewed following labs  CBC: Recent Labs  Lab 06/29/23 0823  WBC 10.4  NEUTROABS 6.9  HGB 10.0*  HCT 29.8*  MCV 92.3  PLT 234   Basic Metabolic Panel: Recent Labs  Lab 06/29/23 0823  NA 139  K 4.0  CL 107  CO2 22  GLUCOSE 162*  BUN 27*  CREATININE 1.54*  CALCIUM 8.9   GFR: Estimated Creatinine Clearance: 19.7 mL/min (A) (by C-G formula based on SCr of 1.54 mg/dL (H)).  Liver Function Tests: Recent Labs  Lab 06/29/23 0823  AST 21  ALT 11  ALKPHOS 61  BILITOT 0.7  PROT 7.0  ALBUMIN 3.5   Coagulation Profile: Recent Labs  Lab 06/29/23 0823  INR 1.1   Urine analysis:    Component Value Date/Time   COLORURINE STRAW (A) 06/29/2023 1231   APPEARANCEUR CLEAR (A) 06/29/2023 1231   LABSPEC 1.013 06/29/2023 1231   PHURINE 8.0 06/29/2023 1231   GLUCOSEU NEGATIVE 06/29/2023 1231   HGBUR NEGATIVE 06/29/2023 1231   BILIRUBINUR NEGATIVE 06/29/2023 1231   BILIRUBINUR negative 03/18/2022 1614   KETONESUR NEGATIVE 06/29/2023 1231   PROTEINUR NEGATIVE 06/29/2023 1231   UROBILINOGEN 0.2 03/18/2022 1614   NITRITE NEGATIVE 06/29/2023 1231   LEUKOCYTESUR NEGATIVE 06/29/2023 1231   This document was prepared using Dragon Voice Recognition software and may include unintentional dictation errors.  Dr. Sedalia Muta [facility]  If 7PM-7AM, please contact overnight-coverage provider If 7AM-7PM, please contact day attending provider [email or web address]  06/29/2023, 4:44 PM

## 2023-06-29 NOTE — Assessment & Plan Note (Signed)
 At baseline

## 2023-06-29 NOTE — ED Provider Notes (Signed)
 [facility] Provider Note    Event Date/Time   First MD Initiated Contact with Patient 06/29/23 [id number]     (approximate)   History   Shortness of Breath   HPI  Tina Curtis is a 89 y.o. female who presents to the ED for evaluation of Shortness of Breath   Review of medical DC summary from 12/31.  History of HTN, NSTEMI, diastolic dysfunction, metastatic bone cancer, stroke on DAPT.  Admitted after a fall with large subdural with 5 mm midline shift, subarachnoid, family declined bur hole.  DAPT was discontinued and she was discharged to a SNF.  Patient presents to the ED for evaluation of shortness of breath just this morning the past couple hours, superimposed on a few days of left hip pain after she slid out of a chair/recliner on Friday.  Reports a minor fall this past Friday, 4 days ago, she slid forward out of a recliner.  Had minimal pain but increasing left hip pain since then.  She does get up with physical therapy and walk with a walker.  Has been able to do so since this fall.  Woke up this morning short of breath.  She was found with room air saturations in the 70s.  Presents to the ED on 6 L nasal cannula reporting shortness of breath and inability to complete full sentences.  Denies much chest or thoracic back pain, reporting continued left hip pain and sensation of dyspnea.  No abdominal pain, emesis   Physical Exam   Triage Vital Signs: ED Triage Vitals  Encounter Vitals Group     BP      Systolic BP Percentile      Diastolic BP Percentile      Pulse      Resp      Temp      Temp src      SpO2      Weight      Height      Head Circumference      Peak Flow      Pain Score      Pain Loc      Pain Education      Exclude from Growth Chart     Most recent vital signs: Vitals:   06/29/23 0841 06/29/23 0906  BP: (!) 119/100 131/65  Pulse: 90   Resp: 18   Temp: 99 F (37.2 C)   SpO2: 93%     General: Awake, no distress.   Well-oriented, able to provide relevant history.  Does seem somewhat dyspneic and inability to speak in full sentences. CV:  Good peripheral perfusion.  Resp:  Normal effort.  No wheezing Abd:  No distention.  Soft MSK:  No deformity noted.  Able to actively range her left hip, some mild overlying tenderness Neuro:  No focal deficits appreciated. Other:     ED Results / Procedures / Treatments   Labs (all labs ordered are listed, but only abnormal results are displayed) Labs Reviewed  COMPREHENSIVE METABOLIC PANEL - Abnormal; Notable for the following components:      Result Value   Glucose, Bld 162 (*)    BUN 27 (*)    Creatinine, Ser 1.54 (*)    GFR, Estimated 31 (*)    All other components within normal limits  CBC WITH DIFFERENTIAL/PLATELET - Abnormal; Notable for the following components:   RBC 3.23 (*)    Hemoglobin 10.0 (*)    HCT 29.8 (*)  Monocytes Absolute 1.1 (*)    Abs Immature Granulocytes 0.11 (*)    All other components within normal limits  APTT - Abnormal; Notable for the following components:   aPTT 37 (*)    All other components within normal limits  BRAIN NATRIURETIC PEPTIDE - Abnormal; Notable for the following components:   B Natriuretic Peptide 680.0 (*)    All other components within normal limits  TROPONIN I (HIGH SENSITIVITY) - Abnormal; Notable for the following components:   Troponin I (High Sensitivity) 24 (*)    All other components within normal limits  TROPONIN I (HIGH SENSITIVITY) - Abnormal; Notable for the following components:   Troponin I (High Sensitivity) 27 (*)    All other components within normal limits  RESP PANEL BY RT-PCR (RSV, FLU A&B, COVID)  RVPGX2  CULTURE, BLOOD (ROUTINE X 2)  CULTURE, BLOOD (ROUTINE X 2)  LACTIC ACID, PLASMA  LACTIC ACID, PLASMA  PROTIME-INR  URINALYSIS, ROUTINE W REFLEX MICROSCOPIC    EKG Sinus rhythm with a rate of 88 bpm.  Normal axis and intervals.  There are signs of acute  ischemia.  RADIOLOGY Plain film of the pelvis and left hip interpreted by me without fracture CXR interpreted by me with signs of CHF and pleural effusion bilaterally CTA chest interpreted by me without PE, pleural effusions bilaterally  Official radiology report(s): DG Hip Unilat W or Wo Pelvis 2-3 Views Left Result Date: 06/29/2023 CLINICAL DATA:  Left hip pain after a fall a few days ago EXAM: DG HIP (WITH OR WITHOUT PELVIS) 2-3V LEFT COMPARISON:  05/06/2023 FINDINGS: AP view of the pelvis and AP/frog leg views of the left hip. Femoral heads are located. Degenerative sclerosis of the left sacroiliac joint. No acute fracture. IMPRESSION: No acute osseous abnormality. Electronically Signed   By: Jeronimo Greaves M.D.   On: 06/29/2023 10:26   DG Chest Port 1 View Result Date: 06/29/2023 CLINICAL DATA:  Short of breath and hypoxic. EXAM: PORTABLE CHEST 1 VIEW COMPARISON:  08/26/2022 FINDINGS: Surgical clips project over the right axilla. Patient is rotated to the right. Cardiomegaly accentuated by AP portable technique. Atherosclerosis in the transverse aorta. Probable small right pleural effusion. No pneumothorax. Progressive interstitial prominence and indistinctness. Similar left and increased right base airspace disease. IMPRESSION: Moderate congestive heart failure. New or increased small right pleural effusion. Persistent left and increased right lower lung airspace disease. Most likely atelectasis. Cannot exclude concurrent pneumonia or aspiration. Electronically Signed   By: Jeronimo Greaves M.D.   On: 06/29/2023 10:23    PROCEDURES and INTERVENTIONS:  .1-3 Lead EKG Interpretation  Performed by: Delton Prairie, MD Authorized by: Delton Prairie, MD     Interpretation: normal     ECG rate:  92   ECG rate assessment: normal     Rhythm: sinus rhythm     Ectopy: none     Conduction: normal   .Critical Care  Performed by: Delton Prairie, MD Authorized by: Delton Prairie, MD   Critical care provider  statement:    Critical care time (minutes):  30   Critical care time was exclusive of:  Separately billable procedures and treating other patients   Critical care was necessary to treat or prevent imminent or life-threatening deterioration of the following conditions:  Respiratory failure   Critical care was time spent personally by me on the following activities:  Development of treatment plan with patient or surrogate, discussions with consultants, evaluation of patient's response to treatment, examination of patient, ordering and  review of laboratory studies, ordering and review of radiographic studies, ordering and performing treatments and interventions, pulse oximetry, re-evaluation of patient's condition and review of old charts   Medications  furosemide (LASIX) injection 60 mg (has no administration in time range)  iohexol (OMNIPAQUE) 350 MG/ML injection 50 mL (50 mLs Intravenous Contrast Given 06/29/23 0930)  morphine (PF) 2 MG/ML injection 2 mg (2 mg Intravenous Given 06/29/23 1015)  morphine (PF) 2 MG/ML injection 2 mg (2 mg Intravenous Given 06/29/23 1127)     IMPRESSION / MDM / ASSESSMENT AND PLAN / ED COURSE  I reviewed the triage vital signs and the nursing notes.  Differential diagnosis includes, but is not limited to, ACS, PTX, PNA, muscle strain/spasm, PE, dissection, anxiety, pleural effusion  {Patient presents with symptoms of an acute illness or injury that is potentially life-threatening.  Pleasant woman presents from SNF with dyspnea and hypoxia with evidence of a CHF exacerbation and pleural effusions requiring medical admission.  Requiring nasal cannula, which is new for the patient.  Renal dysfunction around baseline, renal dysfunction at baseline, CBC without acute features.  Elevated BNP.  CTA chest without PE suspect multifactorial dyspnea with pleural effusions and CHF.  Will start diuresis and consult medicine for admission.  Clinical Course as of 06/29/23 1239   Tue Jun 29, 2023  1204 I called [facility] radiology because after 2 hours we still do not have a read on CTA chest.  They will ensure a read from the radiologist soon. [DS]  1237 Reassessed, Goodfriend at the bedside.  Discussed workup overall, pleural effusion, CHF, diuresis and medical admission.  Possible thoracentesis.  She is agreeable. [DS]    Clinical Course User Index [DS] Delton Prairie, MD     FINAL CLINICAL IMPRESSION(S) / ED DIAGNOSES   Final diagnoses:  Hypoxia  Pleural effusion  Acute on chronic diastolic congestive heart failure (HCC)     Rx / DC Orders   ED Discharge Orders     None        Note:  This document was prepared using Dragon voice recognition software and may include unintentional dictation errors.   Delton Prairie, MD 06/29/23 1239

## 2023-06-29 NOTE — Assessment & Plan Note (Addendum)
 With polyuria, increased urgency I suspect patient has a urinary tract infection A UA has been ordered and pending collection at this time Ceftriaxone 1 g IV daily has been ordered on admission; per pharmacy, patient has tolerated cephalosporin in the past

## 2023-06-29 NOTE — ED Triage Notes (Signed)
 Per EMS she presents with some SOB  Pt with at [facility] States this SOB started this am  Also she had a recent fall on sat

## 2023-06-29 NOTE — Assessment & Plan Note (Addendum)
 Strict I's and O's Status post furosemide 60 mg IV per EDP On admission have ordered furosemide 40 mg IV twice daily, 2 doses ordered for 06/30/2023 Patient had echo done on 05/07/2023 which was read as estimated ejection fraction is 60 to 65%, grade 1 diastolic dysfunction A.m. team to consult cardiology as appropriate for heart failure medication optimization

## 2023-06-29 NOTE — Assessment & Plan Note (Signed)
 Home citalopram 10 mg daily, duloxetine 60 mg daily resumed

## 2023-06-29 NOTE — Assessment & Plan Note (Signed)
 Home gabapentin 300 mg p.o. twice daily resumed

## 2023-06-29 NOTE — Hospital Course (Signed)
 Tina Curtis is a 89 year old female with history of hypertension, depression, anxiety, history of NSTEMI, diastolic heart failure, metastatic bone cancer, history of stroke on dual antiplatelet therapy, who presents to the emergency department for chief concerns of shortness of breath.  Patient reports that she slid out of her chair and landed on her left hip on Saturday, 06/26/2023.  Vitals in the ED showed temperature of 98, respiration rate of 18, heart rate of 90, blood pressure 119/100, SpO2 of 93% on 6 L nasal cannula.  Serum sodium is 139, potassium 4.0, chloride 107, bicarb 22, BUN of 27, serum creatinine 1.54, EGFR 31, nonfasting blood glucose 162, WBC 10.4, hemoglobin 10.0, platelets of 234.  BNP 680.  High sensitive troponin was initially 24 and on repeat is 27.  COVID/influenza A/influenza B/RSV PCR were negative.  Lactic acid were negative x 2.  Blood cultures x 2 are in process.  ED treatment: Morphine 2 mg IV 2 treatments, furosemide 60 mg IV one-time dose.

## 2023-06-29 NOTE — Assessment & Plan Note (Signed)
 Home amlodipine 10 mg nightly, irbesartan 150 mg daily, metoprolol succinate 25 mg daily were resumed on admission Scheduled home hydralazine 50 mg 3 times daily with instructions to skip if SBP is less than 130 has not been resumed on admission Hydralazine 10 mg p.o. every 6 hours as needed for SBP greater 165, 4 days ordered on admission

## 2023-06-29 NOTE — ED Notes (Signed)
 ED TO INPATIENT HANDOFF REPORT  ED Nurse Name and Phone #: [phone]  S Name/Age/Gender Tina Curtis 89 y.o. female Room/Bed: ED15A/ED15A  Code Status   Code Status: Limited: Do not attempt resuscitation (DNR) -DNR-LIMITED -Do Not Intubate/DNI   Home/SNF/Other Skilled nursing facility Patient oriented to: self, place, time, and situation Is this baseline? Yes   Triage Complete: Triage complete  Chief Complaint Acute exacerbation of CHF (congestive heart failure) (HCC) [I50.9]  Triage Note Per EMS she presents with some SOB  Pt with at [facility] States this SOB started this am  Also she had a recent fall on sat    Allergies Allergies  Allergen Reactions   Penicillins Anaphylaxis    Patient tolerates ceftriaxone   Drug Ingredient [Zinc]    Empagliflozin Rash    Level of Care/Admitting Diagnosis ED Disposition     ED Disposition  Admit   Condition  --   Comment  Hospital Area: [facility] [[id number]]  Level of Care: Telemetry Cardiac [[id number]]  Covid Evaluation: Asymptomatic - no recent exposure (last 10 days) testing not required  Diagnosis: Acute exacerbation of CHF (congestive heart failure) [facility]) [[id number]]  Admitting Physician: Lovenia [name] [[id number]]  Attending Physician: COX, AMY N [id number]          B Medical/Surgery History Past Medical History:  Diagnosis Date   Arthritis    Gout   Chronic kidney disease    Diarrhea    Hypercholesteremia    Hypertension    Neuropathy    feet and lower legs   Seasonal allergies    Skin cancer    face   Past Surgical History:  Procedure Laterality Date   ABDOMINAL HYSTERECTOMY  2000   APPENDECTOMY     BREAST BIOPSY Right 09/19/2019   affirm bx of calcs UOQ, x marker, path pending   BREAST BIOPSY Right 09/19/2019   [place] bx of mass,heart marker, path pending   BREAST BIOPSY Right 09/19/2019   [place] bx of LN, coil marker, path pending   CATARACT EXTRACTION Left    CATARACT EXTRACTION W/PHACO Right  10/24/2014   Procedure: CATARACT EXTRACTION PHACO AND INTRAOCULAR LENS PLACEMENT (IOC);  Surgeon: Lockie Mola, MD;  Location: [facility];  Service: Ophthalmology;  Laterality: Right;   ESOPHAGOGASTRODUODENOSCOPY N/A 03/07/2018   Procedure: ESOPHAGOGASTRODUODENOSCOPY (EGD);  Surgeon: Toney Reil, MD;  Location: [facility] ENDOSCOPY;  Service: Gastroenterology;  Laterality: N/A;   MASTECTOMY MODIFIED RADICAL Right 10/13/2019   Procedure: MASTECTOMY MODIFIED RADICAL;  Surgeon: Earline Mayotte, MD;  Location: [facility] ORS;  Service: General;  Laterality: Right;   RENAL ANGIOGRAPHY Right 04/11/2018   Procedure: RENAL ANGIOGRAPHY;  Surgeon: Annice Needy, MD;  Location: [facility] INVASIVE CV LAB;  Service: Cardiovascular;  Laterality: Right;   SIMPLE MASTECTOMY WITH AXILLARY SENTINEL NODE BIOPSY Left 10/13/2019   Procedure: SIMPLE MASTECTOMY TRUE CUT BIOPSY, SENTINEL NODE BIOPSY;  Surgeon: Earline Mayotte, MD;  Location: [facility] ORS;  Service: General;  Laterality: Left;     A IV Location/Drains/Wounds Patient Lines/Drains/Airways Status     Active Line/Drains/Airways     Name Placement date Placement time Site Days   Peripheral IV 06/29/23 20 G Left Antecubital 06/29/23  0824  Antecubital  less than 1   Peripheral IV 06/29/23 20 G Anterior;Left Forearm 06/29/23  0832  Forearm  less than 1            Intake/Output Last 24 hours  Intake/Output Summary (Last 24 hours) at 06/29/2023 2018 Last  data filed at 06/29/2023 1823 Gross per 24 hour  Intake 100 ml  Output --  Net 100 ml    Labs/Imaging Results for orders placed or performed during the hospital encounter of 06/29/23 (from the past 48 hours)  Lactic acid, plasma     Status: None   Collection Time: 06/29/23  8:23 AM  Result Value Ref Range   Lactic Acid, Venous 1.2 0.5 - 1.9 mmol/L    Comment: Performed at [facility], [address] [place], [place] [address]  Comprehensive metabolic panel     Status: Abnormal    Collection Time: 06/29/23  8:23 AM  Result Value Ref Range   Sodium 139 135 - 145 mmol/L   Potassium 4.0 3.5 - 5.1 mmol/L   Chloride 107 98 - 111 mmol/L   CO2 22 22 - 32 mmol/L   Glucose, Bld 162 (H) 70 - 99 mg/dL    Comment: Glucose reference range applies only to samples taken after fasting for at least 8 hours.   BUN 27 (H) 8 - 23 mg/dL   Creatinine, Ser 6.04 (H) 0.44 - 1.00 mg/dL   Calcium 8.9 8.9 - 54.0 mg/dL   Total Protein 7.0 6.5 - 8.1 g/dL   Albumin 3.5 3.5 - 5.0 g/dL   AST 21 15 - 41 U/L   ALT 11 0 - 44 U/L   Alkaline Phosphatase 61 38 - 126 U/L   Total Bilirubin 0.7 0.0 - 1.2 mg/dL   GFR, Estimated 31 (L) >60 mL/min    Comment: (NOTE) Calculated using the CKD-EPI Creatinine Equation (2021)    Anion gap 10 5 - 15    Comment: Performed at [facility], [address] [place], [place] [address]  CBC with Differential     Status: Abnormal   Collection Time: 06/29/23  8:23 AM  Result Value Ref Range   WBC 10.4 4.0 - 10.5 K/uL   RBC 3.23 (L) 3.87 - 5.11 MIL/uL   Hemoglobin 10.0 (L) 12.0 - 15.0 g/dL   HCT 14.7 (L) 82.9 - 56.2 %   MCV 92.3 80.0 - 100.0 fL   MCH 31.0 26.0 - 34.0 pg   MCHC 33.6 30.0 - 36.0 g/dL   RDW 13.0 86.5 - 78.4 %   Platelets 234 150 - 400 K/uL   nRBC 0.0 0.0 - 0.2 %   Neutrophils Relative % 66 %   Neutro Abs 6.9 1.7 - 7.7 K/uL   Lymphocytes Relative 20 %   Lymphs Abs 2.1 0.7 - 4.0 K/uL   Monocytes Relative 10 %   Monocytes Absolute 1.1 (H) 0.1 - 1.0 K/uL   Eosinophils Relative 2 %   Eosinophils Absolute 0.2 0.0 - 0.5 K/uL   Basophils Relative 1 %   Basophils Absolute 0.1 0.0 - 0.1 K/uL   Immature Granulocytes 1 %   Abs Immature Granulocytes 0.11 (H) 0.00 - 0.07 K/uL    Comment: Performed at [facility], [address] [place], [place] [address]  Protime-INR     Status: None   Collection Time: 06/29/23  8:23 AM  Result Value Ref Range   Prothrombin Time 14.1 11.4 - 15.2 seconds   INR 1.1 0.8 - 1.2    Comment:  (NOTE) INR goal varies based on device and disease states. Performed at [facility], [address] [place], [place] [address]   APTT     Status: Abnormal   Collection Time: 06/29/23  8:23 AM  Result Value Ref Range  aPTT 37 (H) 24 - 36 seconds    Comment:        IF BASELINE aPTT IS ELEVATED, SUGGEST PATIENT RISK ASSESSMENT BE USED TO DETERMINE APPROPRIATE ANTICOAGULANT THERAPY. Performed at [facility], [address] [place], [place] [address]   Troponin I (High Sensitivity)     Status: Abnormal   Collection Time: 06/29/23  8:23 AM  Result Value Ref Range   Troponin I (High Sensitivity) 24 (H) <18 ng/L    Comment: (NOTE) Elevated high sensitivity troponin I (hsTnI) values and significant  changes across serial measurements may suggest ACS but many other  chronic and acute conditions are known to elevate hsTnI results.  Refer to the "Links" section for chest pain algorithms and additional  guidance. Performed at [facility], [address] [place], [place] [address]   Brain natriuretic peptide     Status: Abnormal   Collection Time: 06/29/23  8:23 AM  Result Value Ref Range   B Natriuretic Peptide 680.0 (H) 0.0 - 100.0 pg/mL    Comment: Performed at [facility], [address] [place], [place] [address]  Resp panel by RT-PCR (RSV, Flu A&B, Covid) Anterior Nasal Swab     Status: None   Collection Time: 06/29/23  8:54 AM   Specimen: Anterior Nasal Swab  Result Value Ref Range   SARS Coronavirus 2 by RT PCR NEGATIVE NEGATIVE    Comment: (NOTE) SARS-CoV-2 target nucleic acids are NOT DETECTED.  The SARS-CoV-2 RNA is generally detectable in upper respiratory specimens during the acute phase of infection. The lowest concentration of SARS-CoV-2 viral copies this assay can detect is 138 copies/mL. A negative result does not preclude SARS-Cov-2 infection and should not be used as the sole basis for treatment or other patient  management decisions. A negative result may occur with  improper specimen collection/handling, submission of specimen other than nasopharyngeal swab, presence of viral mutation(s) within the areas targeted by this assay, and inadequate number of viral copies(<138 copies/mL). A negative result must be combined with clinical observations, patient history, and epidemiological information. The expected result is Negative.  Fact Sheet for Patients:  BloggerCourse.com  Fact Sheet for Healthcare Providers:  SeriousBroker.it  This test is no t yet approved or cleared by the [place] FDA and  has been authorized for detection and/or diagnosis of SARS-CoV-2 by FDA under an Emergency Use Authorization (EUA). This EUA will remain  in effect (meaning this test can be used) for the duration of the COVID-19 declaration under Section 564(b)(1) of the Act, 21 U.S.C.section 360bbb-3(b)(1), unless the authorization is terminated  or revoked sooner.       Influenza A by PCR NEGATIVE NEGATIVE   Influenza B by PCR NEGATIVE NEGATIVE    Comment: (NOTE) The Xpert Xpress SARS-CoV-2/FLU/RSV plus assay is intended as an aid in the diagnosis of influenza from Nasopharyngeal swab specimens and should not be used as a sole basis for treatment. Nasal washings and aspirates are unacceptable for Xpert Xpress SARS-CoV-2/FLU/RSV testing.  Fact Sheet for Patients: BloggerCourse.com  Fact Sheet for Healthcare Providers: SeriousBroker.it  This test is not yet approved or cleared by the [place] FDA and has been authorized for detection and/or diagnosis of SARS-CoV-2 by FDA under an Emergency Use Authorization (EUA). This EUA will remain in effect (meaning this test can be used) for the duration of the COVID-19 declaration under Section 564(b)(1) of the Act, 21 U.S.C. section 360bbb-3(b)(1), unless the  authorization is terminated or revoked.  Resp Syncytial Virus by PCR NEGATIVE NEGATIVE    Comment: (NOTE) Fact Sheet for Patients: BloggerCourse.com  Fact Sheet for Healthcare Providers: SeriousBroker.it  This test is not yet approved or cleared by the [place] FDA and has been authorized for detection and/or diagnosis of SARS-CoV-2 by FDA under an Emergency Use Authorization (EUA). This EUA will remain in effect (meaning this test can be used) for the duration of the COVID-19 declaration under Section 564(b)(1) of the Act, 21 U.S.C. section 360bbb-3(b)(1), unless the authorization is terminated or revoked.  Performed at [facility], [address] [place], [place] [address]   Lactic acid, plasma     Status: None   Collection Time: 06/29/23 11:10 AM  Result Value Ref Range   Lactic Acid, Venous 0.9 0.5 - 1.9 mmol/L    Comment: Performed at [facility], [address] [place], [place] [address]  Troponin I (High Sensitivity)     Status: Abnormal   Collection Time: 06/29/23 11:10 AM  Result Value Ref Range   Troponin I (High Sensitivity) 27 (H) <18 ng/L    Comment: (NOTE) Elevated high sensitivity troponin I (hsTnI) values and significant  changes across serial measurements may suggest ACS but many other  chronic and acute conditions are known to elevate hsTnI results.  Refer to the "Links" section for chest pain algorithms and additional  guidance. Performed at [facility], [address] [place], [place] [address]   Urinalysis, Routine w reflex microscopic -Urine, Clean Catch     Status: Abnormal   Collection Time: 06/29/23 12:31 PM  Result Value Ref Range   Color, Urine STRAW (A) YELLOW   APPearance CLEAR (A) CLEAR   Specific Gravity, Urine 1.013 1.005 - 1.030   pH 8.0 5.0 - 8.0   Glucose, UA NEGATIVE NEGATIVE mg/dL   Hgb urine dipstick NEGATIVE NEGATIVE   Bilirubin Urine  NEGATIVE NEGATIVE   Ketones, ur NEGATIVE NEGATIVE mg/dL   Protein, ur NEGATIVE NEGATIVE mg/dL   Nitrite NEGATIVE NEGATIVE   Leukocytes,Ua NEGATIVE NEGATIVE    Comment: Performed at [facility], [address] [place], [place] [address]   DG Hip Unilat W or Wo Pelvis 2-3 Views Left Result Date: 06/29/2023 CLINICAL DATA:  Left hip pain after a fall a few days ago EXAM: DG HIP (WITH OR WITHOUT PELVIS) 2-3V LEFT COMPARISON:  05/06/2023 FINDINGS: AP view of the pelvis and AP/frog leg views of the left hip. Femoral heads are located. Degenerative sclerosis of the left sacroiliac joint. No acute fracture. IMPRESSION: No acute osseous abnormality. Electronically Signed   By: Jeronimo Greaves M.D.   On: 06/29/2023 10:26   DG Chest Port 1 View Result Date: 06/29/2023 CLINICAL DATA:  Short of breath and hypoxic. EXAM: PORTABLE CHEST 1 VIEW COMPARISON:  08/26/2022 FINDINGS: Surgical clips project over the right axilla. Patient is rotated to the right. Cardiomegaly accentuated by AP portable technique. Atherosclerosis in the transverse aorta. Probable small right pleural effusion. No pneumothorax. Progressive interstitial prominence and indistinctness. Similar left and increased right base airspace disease. IMPRESSION: Moderate congestive heart failure. New or increased small right pleural effusion. Persistent left and increased right lower lung airspace disease. Most likely atelectasis. Cannot exclude concurrent pneumonia or aspiration. Electronically Signed   By: Jeronimo Greaves M.D.   On: 06/29/2023 10:23    Pending Labs Unresulted Labs (From admission, onward)     Start     Ordered   06/30/23 0500  Basic metabolic panel  Tomorrow morning,  R        06/29/23 1253   06/30/23 0500  CBC  Tomorrow morning,   R        06/29/23 1253   06/29/23 0823  Blood Culture (routine x 2)  (Undifferentiated presentation (screening labs and basic nursing orders))  BLOOD CULTURE X 2,   STAT      06/29/23 0822             Vitals/Pain Today's Vitals   06/29/23 1924 06/29/23 1930 06/29/23 2000 06/29/23 2018  BP:  138/64 (!) 126/53   Pulse:  99 93   Resp:  (!) 21 18   Temp:      TempSrc:      SpO2:  93% 93%   Weight:      Height:      PainSc: 6    Asleep    Isolation Precautions No active isolations  Medications Medications  acetaminophen (TYLENOL) tablet 650 mg (has no administration in time range)    Or  acetaminophen (TYLENOL) suppository 650 mg (has no administration in time range)  ondansetron (ZOFRAN) tablet 4 mg (has no administration in time range)    Or  ondansetron (ZOFRAN) injection 4 mg (has no administration in time range)  furosemide (LASIX) injection 40 mg (has no administration in time range)  morphine (PF) 4 MG/ML injection 4 mg (has no administration in time range)  oxyCODONE-acetaminophen (PERCOCET/ROXICET) 5-325 MG per tablet 1 tablet (1 tablet Oral Given 06/29/23 1929)  anastrozole (ARIMIDEX) tablet 1 mg (1 mg Oral Given 06/29/23 1824)  amLODipine (NORVASC) tablet 10 mg (has no administration in time range)  simvastatin (ZOCOR) tablet 20 mg (20 mg Oral Given 06/29/23 1752)  gabapentin (NEURONTIN) capsule 300 mg (has no administration in time range)  hydrALAZINE (APRESOLINE) tablet 10 mg (has no administration in time range)  metoprolol succinate (TOPROL-XL) 24 hr tablet 25 mg (has no administration in time range)  irbesartan (AVAPRO) tablet 150 mg (has no administration in time range)  citalopram (CELEXA) tablet 10 mg (10 mg Oral Given 06/29/23 1752)  DULoxetine (CYMBALTA) DR capsule 60 mg (60 mg Oral Given 06/29/23 1753)  polyethylene glycol (MIRALAX / GLYCOLAX) packet 17 g (has no administration in time range)  senna (SENOKOT) tablet 8.6 mg (8.6 mg Oral Given 06/29/23 1753)  sodium bicarbonate tablet 650 mg (has no administration in time range)  cyanocobalamin (VITAMIN B12) tablet 1,000 mcg (has no administration in time range)  melatonin tablet 2.5 mg (2.5 mg Oral  Given 06/29/23 1929)  calcium carbonate (OS-CAL - dosed in mg of elemental calcium) tablet 2,500 mg (has no administration in time range)  guaiFENesin (ROBITUSSIN) 100 MG/5ML liquid 200 mg (has no administration in time range)  loratadine (CLARITIN) tablet 10 mg (has no administration in time range)  diclofenac Sodium (VOLTAREN) 1 % topical gel 2 g (has no administration in time range)  cefTRIAXone (ROCEPHIN) 1 g in sodium chloride 0.9 % 100 mL IVPB (0 g Intravenous Stopped 06/29/23 1823)  iohexol (OMNIPAQUE) 350 MG/ML injection 50 mL (50 mLs Intravenous Contrast Given 06/29/23 0930)  morphine (PF) 2 MG/ML injection 2 mg (2 mg Intravenous Given 06/29/23 1015)  morphine (PF) 2 MG/ML injection 2 mg (2 mg Intravenous Given 06/29/23 1127)  furosemide (LASIX) injection 60 mg (60 mg Intravenous Given 06/29/23 1313)    Mobility walks with device     Focused Assessments Pulmonary Assessment Handoff:  Lung sounds:   O2 Device: Nasal Cannula O2 Flow Rate (L/min): 6 L/min  R Recommendations: See Admitting Provider Note  Report given to:   Additional Notes:

## 2023-06-30 DIAGNOSIS — E7800 Pure hypercholesterolemia, unspecified: Secondary | ICD-10-CM | POA: Diagnosis present

## 2023-06-30 DIAGNOSIS — E1122 Type 2 diabetes mellitus with diabetic chronic kidney disease: Secondary | ICD-10-CM | POA: Diagnosis present

## 2023-06-30 DIAGNOSIS — Z853 Personal history of malignant neoplasm of breast: Secondary | ICD-10-CM | POA: Diagnosis not present

## 2023-06-30 DIAGNOSIS — M545 Low back pain, unspecified: Secondary | ICD-10-CM | POA: Diagnosis present

## 2023-06-30 DIAGNOSIS — I130 Hypertensive heart and chronic kidney disease with heart failure and stage 1 through stage 4 chronic kidney disease, or unspecified chronic kidney disease: Secondary | ICD-10-CM | POA: Diagnosis present

## 2023-06-30 DIAGNOSIS — Z79811 Long term (current) use of aromatase inhibitors: Secondary | ICD-10-CM | POA: Diagnosis not present

## 2023-06-30 DIAGNOSIS — E663 Overweight: Secondary | ICD-10-CM | POA: Diagnosis present

## 2023-06-30 DIAGNOSIS — E1140 Type 2 diabetes mellitus with diabetic neuropathy, unspecified: Secondary | ICD-10-CM | POA: Diagnosis present

## 2023-06-30 DIAGNOSIS — L8990 Pressure ulcer of unspecified site, unspecified stage: Secondary | ICD-10-CM | POA: Insufficient documentation

## 2023-06-30 DIAGNOSIS — Z85828 Personal history of other malignant neoplasm of skin: Secondary | ICD-10-CM | POA: Diagnosis not present

## 2023-06-30 DIAGNOSIS — D631 Anemia in chronic kidney disease: Secondary | ICD-10-CM | POA: Diagnosis present

## 2023-06-30 DIAGNOSIS — Z9011 Acquired absence of right breast and nipple: Secondary | ICD-10-CM | POA: Diagnosis not present

## 2023-06-30 DIAGNOSIS — G8929 Other chronic pain: Secondary | ICD-10-CM | POA: Diagnosis present

## 2023-06-30 DIAGNOSIS — G2581 Restless legs syndrome: Secondary | ICD-10-CM | POA: Diagnosis present

## 2023-06-30 DIAGNOSIS — I5033 Acute on chronic diastolic (congestive) heart failure: Secondary | ICD-10-CM | POA: Diagnosis present

## 2023-06-30 DIAGNOSIS — J90 Pleural effusion, not elsewhere classified: Secondary | ICD-10-CM | POA: Diagnosis present

## 2023-06-30 DIAGNOSIS — I509 Heart failure, unspecified: Secondary | ICD-10-CM

## 2023-06-30 DIAGNOSIS — N184 Chronic kidney disease, stage 4 (severe): Secondary | ICD-10-CM | POA: Diagnosis present

## 2023-06-30 DIAGNOSIS — F32A Depression, unspecified: Secondary | ICD-10-CM | POA: Diagnosis present

## 2023-06-30 DIAGNOSIS — J9601 Acute respiratory failure with hypoxia: Secondary | ICD-10-CM | POA: Diagnosis present

## 2023-06-30 DIAGNOSIS — Z8249 Family history of ischemic heart disease and other diseases of the circulatory system: Secondary | ICD-10-CM | POA: Diagnosis not present

## 2023-06-30 DIAGNOSIS — Z79899 Other long term (current) drug therapy: Secondary | ICD-10-CM | POA: Diagnosis not present

## 2023-06-30 DIAGNOSIS — I150 Renovascular hypertension: Secondary | ICD-10-CM | POA: Diagnosis present

## 2023-06-30 DIAGNOSIS — Z1152 Encounter for screening for COVID-19: Secondary | ICD-10-CM | POA: Diagnosis not present

## 2023-06-30 DIAGNOSIS — L89893 Pressure ulcer of other site, stage 3: Secondary | ICD-10-CM | POA: Diagnosis present

## 2023-06-30 DIAGNOSIS — M25552 Pain in left hip: Secondary | ICD-10-CM | POA: Diagnosis present

## 2023-06-30 DIAGNOSIS — Z66 Do not resuscitate: Secondary | ICD-10-CM | POA: Diagnosis present

## 2023-06-30 LAB — CBC
HCT: 28.2 % — ABNORMAL LOW (ref 36.0–46.0)
Hemoglobin: 9.4 g/dL — ABNORMAL LOW (ref 12.0–15.0)
MCH: 30.4 pg (ref 26.0–34.0)
MCHC: 33.3 g/dL (ref 30.0–36.0)
MCV: 91.3 fL (ref 80.0–100.0)
Platelets: 235 10*3/uL (ref 150–400)
RBC: 3.09 MIL/uL — ABNORMAL LOW (ref 3.87–5.11)
RDW: 13.6 % (ref 11.5–15.5)
WBC: 7.8 10*3/uL (ref 4.0–10.5)
nRBC: 0 % (ref 0.0–0.2)

## 2023-06-30 LAB — BASIC METABOLIC PANEL
Anion gap: 10 (ref 5–15)
BUN: 25 mg/dL — ABNORMAL HIGH (ref 8–23)
CO2: 22 mmol/L (ref 22–32)
Calcium: 8.9 mg/dL (ref 8.9–10.3)
Chloride: 107 mmol/L (ref 98–111)
Creatinine, Ser: 1.69 mg/dL — ABNORMAL HIGH (ref 0.44–1.00)
GFR, Estimated: 28 mL/min — ABNORMAL LOW (ref >60)
Glucose, Bld: 125 mg/dL — ABNORMAL HIGH (ref 70–99)
Potassium: 3.6 mmol/L (ref 3.5–5.1)
Sodium: 139 mmol/L (ref 135–145)

## 2023-06-30 MED ORDER — MENTHOL 3 MG MT LOZG
1.0000 | LOZENGE | OROMUCOSAL | Status: DC | PRN
Start: 2023-06-30 — End: 2023-07-03
  Filled 2023-06-30 (×2): qty 9

## 2023-06-30 MED ORDER — HEPARIN SODIUM (PORCINE) 5000 UNIT/ML IJ SOLN
5000.0000 [IU] | Freq: Three times a day (TID) | INTRAMUSCULAR | Status: DC
  Administered 2023-06-30 – 2023-07-03 (×9): 5000 [IU] via SUBCUTANEOUS
  Filled 2023-06-30 (×9): qty 1

## 2023-06-30 NOTE — TOC Initial Note (Signed)
 Transition of Care [facility]) - Initial/Assessment Note    Patient Details  Name: Tina Curtis MRN: [id number] Date of Birth: Sep 26, 1926  [facility] CM/SW Contact:    Margarito Liner, LCSW Phone Number: 06/30/2023, 11:40 AM  Clinical Narrative:   CSW met with patient. Friend at bedside. CSW introduced role and explained that discharge planning would be discussed. Patient confirmed she is from [facility]. Admissions coordinator stated she is a long-term resident there. PT/OT evaluations are pending. No further concerns. CSW will continue to follow patient for support and facilitate return to SNF once medically stable.               Expected Discharge Plan: Skilled Nursing Facility Barriers to Discharge: Continued Medical Work up   Patient Goals and CMS Choice     Choice offered to / list presented to : Patient      Expected Discharge Plan and Services     Post Acute Care Choice: Resumption of Svcs/PTA Provider Living arrangements for the past 2 months: Skilled Nursing Facility                                      Prior Living Arrangements/Services Living arrangements for the past 2 months: Skilled Nursing Facility Lives with:: Facility Resident Patient language and need for interpreter reviewed:: Yes Do you feel safe going back to the place where you live?: Yes      Need for Family Participation in Patient Care: Yes (Comment) Care giver support system in place?: Yes (comment)   Criminal Activity/Legal Involvement Pertinent to Current Situation/Hospitalization: No - Comment as needed  Activities of Daily Living   ADL Screening (condition at time of admission) Independently performs ADLs?: No Does the patient have a NEW difficulty with bathing/dressing/toileting/self-feeding that is expected to last >3 days?: No Does the patient have a NEW difficulty with getting in/out of bed, walking, or climbing stairs that is expected to last >3 days?: No Does  the patient have a NEW difficulty with communication that is expected to last >3 days?: No Is the patient deaf or have difficulty hearing?: No Does the patient have difficulty seeing, even when wearing glasses/contacts?: No Does the patient have difficulty concentrating, remembering, or making decisions?: No  Permission Sought/Granted Permission sought to share information with : Facility Medical sales representative, Family Supports Permission granted to share information with : Yes, Verbal Permission Granted  Share Information with NAME: Pam  Permission granted to share info w AGENCY: [facility]  Permission granted to share info w Relationship: Friend     Emotional Assessment Appearance:: Appears stated age Attitude/Demeanor/Rapport: Engaged, Gracious Affect (typically observed): Accepting, Appropriate, Calm, Pleasant Orientation: : Oriented to Self, Oriented to Place, Oriented to  Time, Oriented to Situation Alcohol / Substance Use: Not Applicable Psych Involvement: No (comment)  Admission diagnosis:  Pleural effusion [J90] Hypoxia [R09.02] Acute exacerbation of CHF (congestive heart failure) (HCC) [I50.9] Acute on chronic diastolic congestive heart failure (HCC) [I50.33] Patient Active Problem List   Diagnosis Date Noted   Acute exacerbation of CHF (congestive heart failure) (HCC) 06/29/2023   Left hip pain 06/29/2023   Dysuria 06/29/2023   Cancer, metastatic to bone (HCC) 04/05/2023   Temporal arteritis (HCC) 04/05/2023   Fall 03/29/2023   Bacteremia due to Streptococcus 08/31/2022   CHF NYHA class III, acute, diastolic (HCC) 08/27/2022   Hematemesis 08/26/2022   NSTEMI (non-ST  elevated myocardial infarction) (HCC) 08/26/2022   Hypertensive urgency 08/26/2022   Hypokalemia 08/26/2022   Hypocalcemia 08/26/2022   Chronic diastolic CHF (congestive heart failure) (HCC) 08/26/2022   Acute blood loss anemia 08/26/2022   Hypomagnesemia 08/26/2022   Hypophosphatemia 08/26/2022    Type II diabetes mellitus with renal manifestations (HCC) 08/26/2022   Rash 08/26/2022   Chest pain 08/26/2022   Emphysema lung (HCC) 06/10/2022   Pneumonia due to infectious organism 06/07/2022   History of breast cancer 06/06/2022   Depression 06/06/2022   Orthostatic hypotension 06/06/2022   Dehydration 06/06/2022   Dizziness and giddiness 06/02/2022   Osteoporosis of forearm 03/28/2020   Breast cancer (HCC) 10/13/2019   Carcinoma of upper-inner quadrant of right breast in female, estrogen receptor positive (HCC) 09/27/2019   Anemia in chronic kidney disease 02/01/2019   Atherosclerosis of renal artery (HCC) 02/01/2019   Benign hypertensive kidney disease with chronic kidney disease 02/01/2019   Hyposmolality and/or hyponatremia 02/01/2019   Proteinuria 02/01/2019   Secondary hyperparathyroidism of renal origin (HCC) 02/01/2019   Anemia of chronic renal failure, stage 3b (HCC) 04/01/2018   Renovascular hypertension 04/01/2018   Renal artery stenosis (HCC) 04/01/2018   Nausea & vomiting 03/05/2018   AKI (acute kidney injury) (HCC) 02/17/2018   Acquired hammer toe of right foot 08/14/2014   Bunion of right foot 08/14/2014   Gout 08/14/2014   History of Clostridium difficile colitis 08/14/2014   Pure hypercholesterolemia 08/14/2014   RLS (restless legs syndrome) 08/14/2014   Hallux rigidus of right foot 04/24/2014   Primary osteoarthritis of foot 09/01/2013   Fatigue 09/30/2012   Gluteus medius or minimus syndrome 08/23/2012   Trochanteric bursitis 08/12/2012   Benign essential hypertension 09/03/2011   Diverticulosis of colon 09/03/2011   Osteopenia 09/03/2011   Enthesopathy of ankle and tarsus 04/13/2011   Osteoarthritis of spine with radiculopathy, lumbosacral region 09/30/2010   Allergic rhinitis 10/31/1928   PCP:  [name], MD Pharmacy:   [id number] [place], [place] - [address] AT [facility] [address] [place] [address] Phone: [phone] Fax: [phone]     Social Drivers of Health (SDOH) Social History: SDOH Screenings   Food Insecurity: No Food Insecurity (06/30/2023)  Housing: Low Risk  (06/30/2023)  Transportation Needs: No Transportation Needs (06/30/2023)  Utilities: Not At Risk (06/30/2023)  Financial Resource Strain: Low Risk  (11/17/2022)   Received from [facility], [facility]  Social Connections: Unknown (06/30/2023)  Tobacco Use: Low Risk  (06/29/2023)   SDOH Interventions:     Readmission Risk Interventions    05/07/2023   12:51 PM 06/08/2022    3:30 PM  Readmission Risk Prevention Plan  Transportation Screening Complete Complete  PCP or Specialist Appt within 3-5 Days Complete   HRI or Home Care Consult Complete   Social Work Consult for Recovery Care Planning/Counseling Complete   Palliative Care Screening Complete Not Applicable  Medication Review Oceanographer) Complete Complete

## 2023-06-30 NOTE — Care Management Obs Status (Signed)
 MEDICARE OBSERVATION STATUS NOTIFICATION   Patient Details  Name: Tina Curtis MRN: [id number] Date of Birth: 1926-12-10   Medicare Observation Status Notification Given:  Orland Dec, CMA 06/30/2023, 2:14 PM

## 2023-06-30 NOTE — Evaluation (Signed)
 Occupational Therapy Evaluation Patient Details Name: Tina Curtis MRN: [id number] [name]: 1926/08/13 Today's Date: 06/30/2023   History of Present Illness   89 y.o female admitted with SOB, acute exacerbation of CHF, and recent fall. PMH of HTN, NSTEMI, chronic diastolic CHF, metastatic bone cancer, T2DM chronic headache, CVA on dual antiplatelet therapy, neuropathic pain, HLD, CKD, anxiety and depression, recent traumatic subarachnoid hemorrhage with 5 mm midline shift     Clinical Impressions Pt admitted with above diagnosis. Pleasant and participatory. PTA, pt reports she lives at [facility] and is able to transfer herself to/from wheelchair, perform BADLs wc level, and staff assists with IADLs (unsure of accuracy, no family or friends able to verify). On OT eval, pt received in recliner and was able to come to standing with modA. LUE/LLE present with significant deficits in motor planning, pt requires L knee block and placement of therapist's foot in front of patient's for safety prior to standing. ModA to correct posterior lean, max multimodal cuing for upright posture. Pt performs step pivot to EOB with RW and modA, step-by-step verbal cues for task segmentation. Difficulties performing weight shift to perform step pattern, pt with limited spatial and proprioceptive awareness of LLE. Pt incontinent of urine, maxA to perform pericare in standing. 3x step pivot transfers performed during session - updated nursing staff that pt will likely need +2 assist, or stedy lift for safety up to recliner/BSC. On 5L via [place] throughout session, Sp02 90% after mobility, left with sats increased to 95%. Pt has poor safety awareness, limited insight into deficits, and is limited by weakness, coordination and balance which impact safe performance in ADLs.   Pt would benefit from skilled OT services to address noted impairments and functional limitations (see below for any additional details) in  order to maximize safety and independence while minimizing falls risk and caregiver burden. Will continue to follow, patient will benefit from continued inpatient follow up therapy, <3 hours/day     If plan is discharge home, recommend the following:   A lot of help with walking and/or transfers;A lot of help with bathing/dressing/bathroom;Assistance with cooking/housework;Direct supervision/assist for medications management;Direct supervision/assist for financial management;Assist for transportation;Supervision due to cognitive status     Functional Status Assessment   Patient has had a recent decline in their functional status and demonstrates the ability to make significant improvements in function in a reasonable and predictable amount of time.     Equipment Recommendations   None recommended by OT      Precautions/Restrictions   Precautions Precautions: Fall Restrictions Weight Bearing Restrictions Per Provider Order: No     Mobility Bed Mobility Overal bed mobility: Needs Assistance Bed Mobility: Sit to Supine       Sit to supine: Mod assist, HOB elevated, Used rails, +2 for physical assistance        Transfers Overall transfer level: Needs assistance Equipment used: Rolling walker (2 wheels) Transfers: Sit to/from Stand, Bed to chair/wheelchair/BSC Sit to Stand: Mod assist     Step pivot transfers: Mod assist     General transfer comment: Requires step by step cues, tactile support on L knee to stand from recliner with L foot blocked. Able to rise to standing, but with immediate posterior lean requiring modA to correct. Achieves upright standing with verbal and tactile cues, step pivot transfer from recliner > EOB.      Balance Overall balance assessment: Needs assistance, History of Falls Sitting-balance support: Feet supported, Bilateral upper extremity supported Sitting  balance-Leahy Scale: Fair   Postural control: Posterior lean Standing  balance support: During functional activity, Bilateral upper extremity supported, Reliant on assistive device for balance Standing balance-Leahy Scale: Poor Standing balance comment: posterior lean                           ADL either performed or assessed with clinical judgement   ADL Overall ADL's : Needs assistance/impaired     Grooming: Sitting;Brushing hair;Set up Grooming Details (indicate cue type and reason): sits EOB with SBA for dynamic balance                 Toilet Transfer: Moderate assistance;Cueing for safety;Cueing for sequencing;Stand-pivot;Rolling walker (2 wheels);BSC/3in1 Toilet Transfer Details (indicate cue type and reason): Requires step by step cues, tactile support on L knee to stand from recliner with L foot blocked. Able to rise to standing, but with immediate posterior lean requiring modA to correct. Achieves upright standing with verbal and tactile cues, step pivot transfer from recliner > EOB with RW and modA. Poor motor planning with LLE Toileting- Clothing Manipulation and Hygiene: Maximal assistance;Sit to/from stand Toileting - Clothing Manipulation Details (indicate cue type and reason): pt severely incontinent of urine with any movement. pericare completed 3x during session due to pure wick failure. able to complete in standing with maxA, +2 end of session for safety     Functional mobility during ADLs: Moderate assistance;Cueing for safety;Cueing for sequencing;Rolling walker (2 wheels) General ADL Comments: Pt recieved in recliner, incontinent of urine upon standing. L sided weakness from prior CVA     Vision Baseline Vision/History: 6 Macular Degeneration Ability to See in Adequate Light: 2 Moderately impaired Patient Visual Report: No change from baseline          Praxis Praxis: Impaired Praxis Impairment Details: Motor planning Praxis-Other Comments: requires step-by-step cues for motor planning of LLE   Pertinent  Vitals/Pain Pain Assessment Pain Assessment: Faces Faces Pain Scale: Hurts even more Pain Location: L hip/L leg Pain Descriptors / Indicators: Grimacing, Spasm, Stabbing, Sore, Shooting Pain Intervention(s): Limited activity within patient's tolerance, Monitored during session, Repositioned     Extremity/Trunk Assessment Upper Extremity Assessment Upper Extremity Assessment: LUE deficits/detail;Right hand dominant;Generalized weakness LUE Sensation: decreased light touch LUE Coordination: decreased fine motor;decreased gross motor   Lower Extremity Assessment Lower Extremity Assessment: LLE deficits/detail LLE Deficits / Details: chronic L foot drop LLE Coordination: decreased gross motor;decreased fine motor       Communication Communication Communication: Impaired Factors Affecting Communication:  (at times, required increased time for responses)   Cognition Arousal: Alert Behavior During Therapy: WFL for tasks assessed/performed Cognition: No family/caregiver present to determine baseline, Cognition impaired     Awareness: Intellectual awareness impaired Memory impairment (select all impairments): Working memory Attention impairment (select first level of impairment): Focused attention Executive functioning impairment (select all impairments): Initiation, Sequencing, Organization, Reasoning, Problem solving OT - Cognition Comments: Decreased insight into deficits, poor problem solving and task initiation                 Following commands: Impaired Following commands impaired: Follows one step commands with increased time, Follows one step commands inconsistently     Cueing  General Comments   Cueing Techniques: Verbal cues;Gestural cues;Tactile cues              Home Living Family/patient expects to be discharged to:: Assisted living  Home Equipment: Rollator (4 wheels);Rolling Walker (2 wheels)   Additional  Comments: Pt states she lives at [facility]      Prior Functioning/Environment Prior Level of Function : Independent/Modified Independent             Mobility Comments: pt states she can transfer alone to wc, only walks with PT/OT at facility ADLs Comments: pt states she is mod independent (mentioned using AE for LB dressing). Questionable based on pt's current level of function. Pt states she performs all ADLs without assistance    OT Problem List: Decreased strength;Decreased range of motion;Decreased activity tolerance;Impaired balance (sitting and/or standing);Impaired vision/perception;Decreased coordination;Decreased cognition;Decreased safety awareness;Decreased knowledge of use of DME or AE;Decreased knowledge of precautions;Cardiopulmonary status limiting activity;Pain   OT Treatment/Interventions: Self-care/ADL training;Therapeutic exercise;Neuromuscular education;Energy conservation;DME and/or AE instruction;Therapeutic activities;Visual/perceptual remediation/compensation;Patient/family education;Balance training;Cognitive remediation/compensation      OT Goals(Current goals can be found in the care plan section)   Acute Rehab OT Goals OT Goal Formulation: With patient Time For Goal Achievement: 06/30/23 Potential to Achieve Goals: Fair   OT Frequency:  Min 1X/week       AM-PAC OT "6 Clicks" Daily Activity     Outcome Measure Help from another person eating meals?: None Help from another person taking care of personal grooming?: A Little Help from another person toileting, which includes using toliet, bedpan, or urinal?: A Lot Help from another person bathing (including washing, rinsing, drying)?: A Lot Help from another person to put on and taking off regular upper body clothing?: A Lot Help from another person to put on and taking off regular lower body clothing?: A Lot 6 Click Score: 15   End of Session Equipment Utilized During Treatment: Gait belt;Rolling  walker (2 wheels) Nurse Communication: Mobility status;Need for lift equipment  Activity Tolerance: Patient tolerated treatment well Patient left: in bed;with call bell/phone within reach;with bed alarm set;with nursing/sitter in room  OT Visit Diagnosis: Unsteadiness on feet (R26.81);Muscle weakness (generalized) (M62.81);History of falling (Z91.81);Other abnormalities of gait and mobility (R26.89);Other symptoms and signs involving the nervous system (R29.898)                Time: 8119-1478 OT Time Calculation (min): 59 min Charges:  OT General Charges $OT Visit: 1 Visit OT Evaluation $OT Eval Moderate Complexity: 1 Mod OT Treatments $Self Care/Home Management : 38-52 mins  Dorthea Maina L. Darshawn Boateng, OTR/L  06/30/23, 2:58 PM

## 2023-06-30 NOTE — Plan of Care (Signed)

## 2023-06-30 NOTE — Evaluation (Signed)
 Physical Therapy Evaluation Patient Details Name: Tina Curtis MRN: [id number] [name]: 19-Dec-1926 Today's Date: 06/30/2023  History of Present Illness  Tina Curtis is a 89yoF who comes to [facility] on 06/29/23 with SOB and Left hip soreness after a fall at facility. PMH: HTN, depression, GAD, NSTEMI, dCHF, metastatic bone cancer, CVA. Imaging left hip not revealing of any acute abnormality. RSV (-). Pt admitted in COPD exacerbation.   Clinical Impression  Pt in bed on entry, visitor and MD at bedside. Pt on 5L/min O2, trialed at 3L in visit with resting sats at 88%, moved back to 5L/min for mobility and end of session. Pt very much limited by left posterior hip pain with most mobility attempts, but also struggles to come to standing blended by both chronic neuromotor limitations and acute pain/weakness. Pt up to recliner at end of session, all needs met- assisted with phone call to dietary to put in lunch order. Will continue to follow.       If plan is discharge home, recommend the following: A lot of help with bathing/dressing/bathroom;A lot of help with walking and/or transfers;Assist for transportation;Help with stairs or ramp for entrance   Can travel by private vehicle   No    Equipment Recommendations None recommended by PT  Recommendations for Other Services       Functional Status Assessment Patient has had a recent decline in their functional status and demonstrates the ability to make significant improvements in function in a reasonable and predictable amount of time.     Precautions / Restrictions Precautions Precautions: Fall Restrictions Weight Bearing Restrictions Per Provider Order: No      Mobility  Bed Mobility Overal bed mobility: Needs Assistance Bed Mobility: Sit to Supine       Sit to supine: Supervision, HOB elevated   General bed mobility comments: To Left EOB, labored, difficult, painful LLE; 75% through pt reports going to Rt EOB is the easier direction.     Transfers Overall transfer level: Needs assistance Equipment used: Rolling walker (2 wheels) Transfers: Sit to/from Stand, Bed to chair/wheelchair/BSC Sit to Stand: Contact guard assist, From elevated surface   Step pivot transfers: Min assist, Contact guard assist       General transfer comment: given several minutes to problem solve through barriers, cues given periodically. Hand placement cues, difficulty with static foot stability on floor Left >Right; frequent cues for correction of postural lean, recurrent and excessive posteror lean.    Ambulation/Gait Ambulation/Gait assistance:  (deferred due to exertion and imbalance)                Stairs            Wheelchair Mobility     Tilt Bed    Modified Rankin (Stroke Patients Only)       Balance     Sitting balance-Leahy Scale: Good     Standing balance support: During functional activity Standing balance-Leahy Scale: Zero                               Pertinent Vitals/Pain Pain Assessment Pain Assessment: Faces Pain Location: L hip/L leg Pain Descriptors / Indicators: Grimacing, Spasm, Stabbing, Sore, Shooting Pain Intervention(s): Limited activity within patient's tolerance, Monitored during session, Repositioned    Home Living Family/patient expects to be discharged to:: Skilled nursing facility (SNF at [facility])  Home Equipment: Rollator (4 wheels);Rolling Walker (2 wheels) Additional Comments: Pt states she lives at [facility]    Prior Function Prior Level of Function : Independent/Modified Independent             Mobility Comments: pt states she can transfer alone to wc, only walks with PT/OT at facilityusing a walker. History of frequent falls. ADLs Comments: pt states she is mod independent (mentioned using AE for LB dressing). Questionable based on pt's current level of function. Pt states she performs all ADLs without assistance      Extremity/Trunk Assessment   Upper Extremity Assessment Upper Extremity Assessment: LUE deficits/detail;Right hand dominant;Generalized weakness LUE Sensation: decreased light touch LUE Coordination: decreased fine motor;decreased gross motor    Lower Extremity Assessment Lower Extremity Assessment: LLE deficits/detail LLE Deficits / Details: hypotonic Left foot drop with adapted steppage pattern indicatie of chronicity LLE Coordination: decreased gross motor;decreased fine motor       Communication   Communication Communication: Impaired Factors Affecting Communication:  (at times, required increased time for responses)    Cognition Arousal: Alert Behavior During Therapy: WFL for tasks assessed/performed                           PT - Cognition Comments: mild difficulty with problem solving in mobility, ST recall of cues for DME use         Cueing       General Comments      Exercises     Assessment/Plan    PT Assessment Patient needs continued PT services  PT Problem List Decreased strength;Decreased range of motion;Decreased activity tolerance;Decreased balance;Decreased mobility;Decreased coordination       PT Treatment Interventions DME instruction;Gait training;Stair training;Functional mobility training;Therapeutic activities;Therapeutic exercise;Balance training;Neuromuscular re-education;Patient/family education    PT Goals (Current goals can be found in the Care Plan section)  Acute Rehab PT Goals Patient Stated Goal: regain strength and hav less Left hip pain PT Goal Formulation: With patient Time For Goal Achievement: 07/14/23 Potential to Achieve Goals: Good    Frequency Min 1X/week     Co-evaluation               AM-PAC PT "6 Clicks" Mobility  Outcome Measure Help needed turning from your back to your side while in a flat bed without using bedrails?: A Little Help needed moving from lying on your back to sitting on the  side of a flat bed without using bedrails?: A Little Help needed moving to and from a bed to a chair (including a wheelchair)?: A Lot Help needed standing up from a chair using your arms (e.g., wheelchair or bedside chair)?: A Lot Help needed to walk in hospital room?: A Lot Help needed climbing 3-5 steps with a railing? : A Lot 6 Click Score: 14    End of Session Equipment Utilized During Treatment: Oxygen Activity Tolerance: Patient limited by fatigue;Patient limited by pain Patient left: in chair;with call bell/phone within reach;with chair alarm set Nurse Communication: Mobility status PT Visit Diagnosis: Unsteadiness on feet (R26.81);Repeated falls (R29.6);Other abnormalities of gait and mobility (R26.89);Muscle weakness (generalized) (M62.81);History of falling (Z91.81)    Time: 1610-9604 PT Time Calculation (min) (ACUTE ONLY): 25 min   Charges:   PT Evaluation $PT Eval Moderate Complexity: 1 Mod PT Treatments $Therapeutic Activity: 8-22 mins PT General Charges $$ ACUTE PT VISIT: 1 Visit        3:41 PM, 06/30/23 Rosamaria Lints, PT, DPT  Physical Therapist - [facility]  [phone] (ASCOM)    Gennifer Potenza C 06/30/2023, 3:38 PM

## 2023-06-30 NOTE — Progress Notes (Signed)
 PROGRESS NOTE    Tina Curtis   RUE:[id number] [name]: 06-16-1926  [name]: 06/29/2023 Date of Service: 06/30/23 which is hospital day 0  PCP: [name], MD    Hospital course / significant events:   HPI: Tina Curtis is a 89 year old female with history of hypertension, depression, anxiety, history of NSTEMI, diastolic heart failure, metastatic bone cancer, history of stroke on dual antiplatelet therapy, who presents to the emergency department from [facility] via EMS on 06/29/23 for chief concerns of shortness of breath and fall. Patient reports that she slid out of her chair and landed on her left hip on Saturday, 06/26/2023. She endorses swelling of both her legs. She endorses dysuria and loose stool.  Patient reports she has been having burning sensation and increased urination for several days. She has had a lot of stress lately because her youngest daughter, who is 44 has had a kidney transplant and was recently admitted to hospital and currently is on a ventilator in [place].   02/18: BNP 680. Requiring O2. Admitted to hospitalist service for HFpEF exacerbation. Diuresing.  02/19: UOP appears under-documented but pt reports peeing a lot. Reports breathing better today.      Consultants:  none  Procedures/Surgeries: none      ASSESSMENT & PLAN:   Acute exacerbation of HFpEF Furosemide 40 mg IV twice daily, 2 doses ordered for 06/30/2023 Patient had echo done on 05/07/2023 which was read as estimated ejection fraction is 60 to 65%, grade 1 diastolic dysfunction - will not repeat echo now given recent results  Strict I's and O's   CKD4 Question cardiorenal syndrome Following w/ nephrology outpatient Closely monitor BMP w/ diuresis  Renovascular hypertension Home amlodipine 10 mg nightly, irbesartan 150 mg daily, metoprolol succinate 25 mg daily were resumed on admission Scheduled home hydralazine 50 mg 3 times daily with instructions to skip if SBP is less than 130  has not been resumed on admission Hydralazine 10 mg p.o. every 6 hours as needed for SBP greater 165, 4 days ordered on admission  Depression Home citalopram 10 mg daily, duloxetine 60 mg daily resumed   Dysuria, polyuria, urinary urgency Nitrofurantoin Outpatient follow-up, UA ok may need to treat for atrophic vaginitis   History of breast cancer Home anastrozole 1 mg daily resumed   Anemia in chronic kidney disease At baseline Monitor CBC, BMP    RLS (restless legs syndrome) Home gabapentin 300 mg p.o. twice daily resumed      overweight based on BMI: Body mass index is 25.97 kg/m.  Underweight - under 18  overweight - 25 to 29 obese - 30 or more Class 1 obesity: BMI of 30.0 to 34 Class 2 obesity: BMI of 35.0 to 39 Class 3 obesity: BMI of 40.0 to 49 Super Morbid Obesity: BMI 50-59 Super-super Morbid Obesity: BMI 60+ Significantly low or high BMI is associated with higher medical risk.  Weight management advised as adjunct to other disease management and risk reduction treatments    DVT prophylaxis: heparin IV fluids: no continuous IV fluids  Nutrition: cardiac diet Central lines / invasive devices: none  Code Status: DNR ACP documentation reviewed: has DNR order dated 04/24/24on file in [facility]  [facility] needs: SNF rehab placement Barriers to dispo / significant pending items: diuresing, may be medically stable next 1-2 days           Subjective / Brief ROS:  Patient reports dry throat today, not really sore No sinus pressure, no coughing  Denies CP/SOB.  Pain controlled.  Denies new weakness.  Tolerating diet.  Reports no concerns w/ urination/defecation.   Family Communication: spoke w/ daughter [name] on the phone 06/30/23 4:04 PM     Objective Findings:  Vitals:   06/30/23 0400 06/30/23 0754 06/30/23 1249 06/30/23 1534  BP: (!) 138/53 (!) 144/61 132/65 122/65  Pulse: 96 93 84 87  Resp: 20     Temp: 99.3 F (37.4 C) 98.4 F (36.9 C) 98.4 F  (36.9 C) 99.4 F (37.4 C)  TempSrc:  Oral Oral Oral  SpO2: 90% 92% 98% 91%  Weight:      Height:        Intake/Output Summary (Last 24 hours) at 06/30/2023 1557 Last data filed at 06/30/2023 1541 Gross per 24 hour  Intake 100 ml  Output 400 ml  Net -300 ml   Filed Weights   06/29/23 0820  Weight: 64.4 kg    Examination:  Physical Exam Constitutional:      General: She is not in acute distress. Cardiovascular:     Rate and Rhythm: Normal rate and regular rhythm.  Pulmonary:     Effort: Pulmonary effort is normal.     Breath sounds: Rales present.  Musculoskeletal:     Right lower leg: No edema.     Left lower leg: No edema.  Neurological:     General: No focal deficit present.     Mental Status: She is alert and oriented to person, place, and time.  Psychiatric:        Mood and Affect: Mood normal.        Behavior: Behavior normal.          Scheduled Medications:   amLODipine  10 mg Oral QHS   anastrozole  1 mg Oral Daily   calcium carbonate  2 tablet Oral Q breakfast   citalopram  10 mg Oral Daily   cyanocobalamin  1,000 mcg Oral Daily   diclofenac Sodium  2 g Topical QHS   DULoxetine  60 mg Oral Daily   furosemide  40 mg Intravenous BID   gabapentin  300 mg Oral BID   heparin injection (subcutaneous)  5,000 Units Subcutaneous Q8H   irbesartan  150 mg Oral Daily   melatonin  2.5 mg Oral Q2000   metoprolol succinate  25 mg Oral Daily   polyethylene glycol  17 g Oral Q M,W,F   senna  1 tablet Oral QODAY   simvastatin  20 mg Oral QPM    Continuous Infusions:  cefTRIAXone (ROCEPHIN)  IV Stopped (06/29/23 1823)    PRN Medications:  acetaminophen **OR** acetaminophen, guaiFENesin, hydrALAZINE, loratadine, menthol-cetylpyridinium, ondansetron **OR** ondansetron (ZOFRAN) IV, sodium bicarbonate  Antimicrobials from admission:  Anti-infectives (From admission, onward)    Start     Dose/Rate Route Frequency Ordered Stop   06/29/23 2200  nitrofurantoin  (macrocrystal-monohydrate) (MACROBID) capsule 100 mg  Status:  Discontinued        100 mg Oral Every 12 hours 06/29/23 1559 06/29/23 1628   06/29/23 1800  cefTRIAXone (ROCEPHIN) 1 g in sodium chloride 0.9 % 100 mL IVPB        1 g 200 mL/hr over 30 Minutes Intravenous Every 24 hours 06/29/23 1625             Data Reviewed:  I have personally reviewed the following...  CBC: Recent Labs  Lab 06/29/23 0823 06/30/23 0452  WBC 10.4 7.8  NEUTROABS 6.9  --   HGB 10.0* 9.4*  HCT 29.8* 28.2*  MCV 92.3 91.3  PLT 234 235   Basic Metabolic Panel: Recent Labs  Lab 06/29/23 0823 06/30/23 0452  NA 139 139  K 4.0 3.6  CL 107 107  CO2 22 22  GLUCOSE 162* 125*  BUN 27* 25*  CREATININE 1.54* 1.69*  CALCIUM 8.9 8.9   GFR: Estimated Creatinine Clearance: 17.9 mL/min (A) (by C-G formula based on SCr of 1.69 mg/dL (H)). Liver Function Tests: Recent Labs  Lab 06/29/23 0823  AST 21  ALT 11  ALKPHOS 61  BILITOT 0.7  PROT 7.0  ALBUMIN 3.5   No results for input(s): "LIPASE", "AMYLASE" in the last 168 hours. No results for input(s): "AMMONIA" in the last 168 hours. Coagulation Profile: Recent Labs  Lab 06/29/23 0823  INR 1.1   Cardiac Enzymes: No results for input(s): "CKTOTAL", "CKMB", "CKMBINDEX", "TROPONINI" in the last 168 hours. BNP (last 3 results) No results for input(s): "PROBNP" in the last 8760 hours. HbA1C: No results for input(s): "HGBA1C" in the last 72 hours. CBG: No results for input(s): "GLUCAP" in the last 168 hours. Lipid Profile: No results for input(s): "CHOL", "HDL", "LDLCALC", "TRIG", "CHOLHDL", "LDLDIRECT" in the last 72 hours. Thyroid Function Tests: No results for input(s): "TSH", "T4TOTAL", "FREET4", "T3FREE", "THYROIDAB" in the last 72 hours. Anemia Panel: No results for input(s): "VITAMINB12", "FOLATE", "FERRITIN", "TIBC", "IRON", "RETICCTPCT" in the last 72 hours. Most Recent Urinalysis On File:     Component Value Date/Time   COLORURINE  STRAW (A) 06/29/2023 1231   APPEARANCEUR CLEAR (A) 06/29/2023 1231   LABSPEC 1.013 06/29/2023 1231   PHURINE 8.0 06/29/2023 1231   GLUCOSEU NEGATIVE 06/29/2023 1231   HGBUR NEGATIVE 06/29/2023 1231   BILIRUBINUR NEGATIVE 06/29/2023 1231   BILIRUBINUR negative 03/18/2022 1614   KETONESUR NEGATIVE 06/29/2023 1231   PROTEINUR NEGATIVE 06/29/2023 1231   UROBILINOGEN 0.2 03/18/2022 1614   NITRITE NEGATIVE 06/29/2023 1231   LEUKOCYTESUR NEGATIVE 06/29/2023 1231   Sepsis Labs: @LABRCNTIP (procalcitonin:4,lacticidven:4) Microbiology: Recent Results (from the past 240 hours)  Blood Culture (routine x 2)     Status: None (Preliminary result)   Collection Time: 06/29/23  8:23 AM   Specimen: BLOOD  Result Value Ref Range Status   Specimen Description BLOOD LEFT ANTECUBITAL  Final   Special Requests   Final    BOTTLES DRAWN AEROBIC AND ANAEROBIC Blood Culture adequate volume   Culture   Final    NO GROWTH < 24 HOURS Performed at [facility], [address], [place], [place] [address]    Report Status PENDING  Incomplete  Blood Culture (routine x 2)     Status: None (Preliminary result)   Collection Time: 06/29/23  8:28 AM   Specimen: BLOOD  Result Value Ref Range Status   Specimen Description BLOOD BLOOD LEFT FOREARM  Final   Special Requests   Final    BOTTLES DRAWN AEROBIC AND ANAEROBIC Blood Culture results may not be optimal due to an inadequate volume of blood received in culture bottles   Culture   Final    NO GROWTH < 24 HOURS Performed at [facility], [address] [place], [place] [address]    Report Status PENDING  Incomplete  Resp panel by RT-PCR (RSV, Flu A&B, Covid) Anterior Nasal Swab     Status: None   Collection Time: 06/29/23  8:54 AM   Specimen: Anterior Nasal Swab  Result Value Ref Range Status   SARS Coronavirus 2 by RT PCR NEGATIVE NEGATIVE Final    Comment: (NOTE) SARS-CoV-2  target nucleic acids are NOT DETECTED.  The SARS-CoV-2 RNA is  generally detectable in upper respiratory specimens during the acute phase of infection. The lowest concentration of SARS-CoV-2 viral copies this assay can detect is 138 copies/mL. A negative result does not preclude SARS-Cov-2 infection and should not be used as the sole basis for treatment or other patient management decisions. A negative result may occur with  improper specimen collection/handling, submission of specimen other than nasopharyngeal swab, presence of viral mutation(s) within the areas targeted by this assay, and inadequate number of viral copies(<138 copies/mL). A negative result must be combined with clinical observations, patient history, and epidemiological information. The expected result is Negative.  Fact Sheet for Patients:  BloggerCourse.com  Fact Sheet for Healthcare Providers:  SeriousBroker.it  This test is no t yet approved or cleared by the [place] FDA and  has been authorized for detection and/or diagnosis of SARS-CoV-2 by FDA under an Emergency Use Authorization (EUA). This EUA will remain  in effect (meaning this test can be used) for the duration of the COVID-19 declaration under Section 564(b)(1) of the Act, 21 U.S.C.section 360bbb-3(b)(1), unless the authorization is terminated  or revoked sooner.       Influenza A by PCR NEGATIVE NEGATIVE Final   Influenza B by PCR NEGATIVE NEGATIVE Final    Comment: (NOTE) The Xpert Xpress SARS-CoV-2/FLU/RSV plus assay is intended as an aid in the diagnosis of influenza from Nasopharyngeal swab specimens and should not be used as a sole basis for treatment. Nasal washings and aspirates are unacceptable for Xpert Xpress SARS-CoV-2/FLU/RSV testing.  Fact Sheet for Patients: BloggerCourse.com  Fact Sheet for Healthcare Providers: SeriousBroker.it  This test is not yet approved or cleared by the [place] FDA and has been authorized for detection and/or diagnosis of SARS-CoV-2 by FDA under an Emergency Use Authorization (EUA). This EUA will remain in effect (meaning this test can be used) for the duration of the COVID-19 declaration under Section 564(b)(1) of the Act, 21 U.S.C. section 360bbb-3(b)(1), unless the authorization is terminated or revoked.     Resp Syncytial Virus by PCR NEGATIVE NEGATIVE Final    Comment: (NOTE) Fact Sheet for Patients: BloggerCourse.com  Fact Sheet for Healthcare Providers: SeriousBroker.it  This test is not yet approved or cleared by the [place] FDA and has been authorized for detection and/or diagnosis of SARS-CoV-2 by FDA under an Emergency Use Authorization (EUA). This EUA will remain in effect (meaning this test can be used) for the duration of the COVID-19 declaration under Section 564(b)(1) of the Act, 21 U.S.C. section 360bbb-3(b)(1), unless the authorization is terminated or revoked.  Performed at [facility], [address], [place], [place] [address]       Radiology Studies last 3 days: CT Angio Chest PE W and/or Wo Contrast Result Date: 06/29/2023 CLINICAL DATA:  Shortness of breath and hypoxia. Concern for pulmonary embolism. Recent fall. EXAM: CT ANGIOGRAPHY CHEST WITH CONTRAST TECHNIQUE: Multidetector CT imaging of the chest was performed using the standard protocol during bolus administration of intravenous contrast. Multiplanar CT image reconstructions and MIPs were obtained to evaluate the vascular anatomy. RADIATION DOSE REDUCTION: This exam was performed according to the departmental dose-optimization program which includes automated exposure control, adjustment of the mA and/or kV according to patient size and/or use of iterative reconstruction technique. CONTRAST:  50mL OMNIPAQUE IOHEXOL 350 MG/ML SOLN COMPARISON:  Chest CT dated 03/29/2023. Radiograph dated  06/29/2023. FINDINGS: Cardiovascular: Mild cardiomegaly. No pericardial effusion. There is coronary  vascular calcification. Moderate calcified and noncalcified plaque of the thoracic aorta. No aneurysmal dilatation or dissection. The origins of the great vessels of the aortic arch appear patent. Evaluation of the pulmonary arteries is limited due to respiratory motion. No pulmonary artery embolus identified. Mediastinum/Nodes: No hilar or mediastinal adenopathy. The esophagus is grossly unremarkable. No mediastinal fluid collection. Lungs/Pleura: Small left and moderate right pleural effusions with partial compressive atelectasis of the lower lobes. There is diffuse interstitial and interlobular septal prominence consistent with edema. There is no pneumothorax. The central airways are patent. Upper Abdomen: Indeterminate cystic pancreatic lesions as seen on the prior CT and better evaluated on the CT abdomen pelvis of 08/22/2022. Musculoskeletal: Osteopenia with degenerative changes of the spine. No acute osseous pathology. There is stranding and edema of the chest wall. Review of the MIP images confirms the above findings. IMPRESSION: 1. No CT evidence of pulmonary artery embolus. 2. Mild cardiomegaly with pulmonary edema and bilateral pleural effusions, right greater than left. 3. Bilateral lower lobe atelectasis or pneumonia. 4.  Aortic Atherosclerosis (ICD10-I70.0). Electronically Signed   By: Elgie Collard M.D.   On: 06/29/2023 12:17   DG Hip Unilat W or Wo Pelvis 2-3 Views Left Result Date: 06/29/2023 CLINICAL DATA:  Left hip pain after a fall a few days ago EXAM: DG HIP (WITH OR WITHOUT PELVIS) 2-3V LEFT COMPARISON:  05/06/2023 FINDINGS: AP view of the pelvis and AP/frog leg views of the left hip. Femoral heads are located. Degenerative sclerosis of the left sacroiliac joint. No acute fracture. IMPRESSION: No acute osseous abnormality. Electronically Signed   By: Jeronimo Greaves M.D.   On: 06/29/2023 10:26    DG Chest Port 1 View Result Date: 06/29/2023 CLINICAL DATA:  Short of breath and hypoxic. EXAM: PORTABLE CHEST 1 VIEW COMPARISON:  08/26/2022 FINDINGS: Surgical clips project over the right axilla. Patient is rotated to the right. Cardiomegaly accentuated by AP portable technique. Atherosclerosis in the transverse aorta. Probable small right pleural effusion. No pneumothorax. Progressive interstitial prominence and indistinctness. Similar left and increased right base airspace disease. IMPRESSION: Moderate congestive heart failure. New or increased small right pleural effusion. Persistent left and increased right lower lung airspace disease. Most likely atelectasis. Cannot exclude concurrent pneumonia or aspiration. Electronically Signed   By: Jeronimo Greaves M.D.   On: 06/29/2023 10:23        Sunnie Nielsen, DO [facility] 06/30/2023, 3:57 PM    Dictation software may have been used to generate the above note. Typos may occur and escape review in typed/dictated notes. Please contact Dr Lyn Hollingshead directly for clarity if needed.  Staff may message me via secure chat in Epic  but this may not receive an immediate response,  please page me for urgent matters!  If 7PM-7AM, please contact night coverage [email or web address]

## 2023-07-01 DIAGNOSIS — I5033 Acute on chronic diastolic (congestive) heart failure: Secondary | ICD-10-CM | POA: Diagnosis not present

## 2023-07-01 LAB — BASIC METABOLIC PANEL
Anion gap: 11 (ref 5–15)
BUN: 27 mg/dL — ABNORMAL HIGH (ref 8–23)
CO2: 25 mmol/L (ref 22–32)
Calcium: 9 mg/dL (ref 8.9–10.3)
Chloride: 105 mmol/L (ref 98–111)
Creatinine, Ser: 1.74 mg/dL — ABNORMAL HIGH (ref 0.44–1.00)
GFR, Estimated: 27 mL/min — ABNORMAL LOW (ref >60)
Glucose, Bld: 141 mg/dL — ABNORMAL HIGH (ref 70–99)
Potassium: 3.5 mmol/L (ref 3.5–5.1)
Sodium: 141 mmol/L (ref 135–145)

## 2023-07-01 LAB — CBC
HCT: 28.4 % — ABNORMAL LOW (ref 36.0–46.0)
Hemoglobin: 9.6 g/dL — ABNORMAL LOW (ref 12.0–15.0)
MCH: 30.8 pg (ref 26.0–34.0)
MCHC: 33.8 g/dL (ref 30.0–36.0)
MCV: 91 fL (ref 80.0–100.0)
Platelets: 233 10*3/uL (ref 150–400)
RBC: 3.12 MIL/uL — ABNORMAL LOW (ref 3.87–5.11)
RDW: 13.2 % (ref 11.5–15.5)
WBC: 7.1 10*3/uL (ref 4.0–10.5)
nRBC: 0 % (ref 0.0–0.2)

## 2023-07-01 NOTE — TOC Progression Note (Addendum)
 Transition of Care [facility]) - Progression Note    Patient Details  Name: Tina Curtis MRN: [id number] Date of Birth: Sep 24, 1926  Transition of Care [facility] CM/SW Contact  Margarito Liner, LCSW Phone Number: 07/01/2023, 4:06 PM  Clinical Narrative:  Patient is agreeable to rehab when she returns to [facility] if insurance will approve it.   4:24 pm: SNF admissions coordinator confirmed they can accept patient back over the weekend or Monday if stable. CSW started insurance authorization.  Expected Discharge Plan: Skilled Nursing Facility Barriers to Discharge: Continued Medical Work up  Expected Discharge Plan and Services     Post Acute Care Choice: Resumption of Svcs/PTA Provider Living arrangements for the past 2 months: Skilled Nursing Facility                                       Social Determinants of Health (SDOH) Interventions SDOH Screenings   Food Insecurity: No Food Insecurity (06/30/2023)  Housing: Low Risk  (06/30/2023)  Transportation Needs: No Transportation Needs (06/30/2023)  Utilities: Not At Risk (06/30/2023)  Financial Resource Strain: Low Risk  (11/17/2022)   Received from [facility], [facility]  Social Connections: Unknown (06/30/2023)  Tobacco Use: Low Risk  (06/29/2023)    Readmission Risk Interventions    05/07/2023   12:51 PM 06/08/2022    3:30 PM  Readmission Risk Prevention Plan  Transportation Screening Complete Complete  PCP or Specialist Appt within 3-5 Days Complete   HRI or Home Care Consult Complete   Social Work Consult for Recovery Care Planning/Counseling Complete   Palliative Care Screening Complete Not Applicable  Medication Review Oceanographer) Complete Complete

## 2023-07-01 NOTE — [place] FL2 (Signed)
 [place] MEDICAID FL2 LEVEL OF CARE FORM     IDENTIFICATION  Patient Name: Tina Curtis Birthdate: 07-15-1926 Sex: female Admission Date (Current Location): 06/29/2023  [place] and [place] Number:  Chiropodist and Address:  [facility], [address], [place], [place] [address]      Provider Number: [id number]  Attending Physician Name and Address:  Sunnie Nielsen, DO  Relative Name and Phone Number:       Current Level of Care: Hospital Recommended Level of Care: Skilled Nursing Facility Prior Approval Number:    Date Approved/Denied:   PASRR Number: [id number]  Discharge Plan: SNF    Current Diagnoses: Patient Active Problem List   Diagnosis Date Noted   CHF exacerbation (HCC) 06/30/2023   Pressure injury of skin 06/30/2023   Acute exacerbation of CHF (congestive heart failure) (HCC) 06/29/2023   Left hip pain 06/29/2023   Dysuria 06/29/2023   Cancer, metastatic to bone (HCC) 04/05/2023   Temporal arteritis (HCC) 04/05/2023   Fall 03/29/2023   Bacteremia due to Streptococcus 08/31/2022   CHF NYHA class III, acute, diastolic (HCC) 08/27/2022   Hematemesis 08/26/2022   NSTEMI (non-ST elevated myocardial infarction) (HCC) 08/26/2022   Hypertensive urgency 08/26/2022   Hypokalemia 08/26/2022   Hypocalcemia 08/26/2022   Chronic diastolic CHF (congestive heart failure) (HCC) 08/26/2022   Acute blood loss anemia 08/26/2022   Hypomagnesemia 08/26/2022   Hypophosphatemia 08/26/2022   Type II diabetes mellitus with renal manifestations (HCC) 08/26/2022   Rash 08/26/2022   Chest pain 08/26/2022   Emphysema lung (HCC) 06/10/2022   Pneumonia due to infectious organism 06/07/2022   History of breast cancer 06/06/2022   Depression 06/06/2022   Orthostatic hypotension 06/06/2022   Dehydration 06/06/2022   Dizziness and giddiness 06/02/2022   Osteoporosis of forearm 03/28/2020   Breast cancer (HCC) 10/13/2019   Carcinoma of  upper-inner quadrant of right breast in female, estrogen receptor positive (HCC) 09/27/2019   Anemia in chronic kidney disease 02/01/2019   Atherosclerosis of renal artery (HCC) 02/01/2019   Benign hypertensive kidney disease with chronic kidney disease 02/01/2019   Hyposmolality and/or hyponatremia 02/01/2019   Proteinuria 02/01/2019   Secondary hyperparathyroidism of renal origin (HCC) 02/01/2019   Anemia of chronic renal failure, stage 3b (HCC) 04/01/2018   Renovascular hypertension 04/01/2018   Renal artery stenosis (HCC) 04/01/2018   Nausea & vomiting 03/05/2018   AKI (acute kidney injury) (HCC) 02/17/2018   Acquired hammer toe of right foot 08/14/2014   Bunion of right foot 08/14/2014   Gout 08/14/2014   History of Clostridium difficile colitis 08/14/2014   Pure hypercholesterolemia 08/14/2014   RLS (restless legs syndrome) 08/14/2014   Hallux rigidus of right foot 04/24/2014   Primary osteoarthritis of foot 09/01/2013   Fatigue 09/30/2012   Gluteus medius or minimus syndrome 08/23/2012   Trochanteric bursitis 08/12/2012   Benign essential hypertension 09/03/2011   Diverticulosis of colon 09/03/2011   Osteopenia 09/03/2011   Enthesopathy of ankle and tarsus 04/13/2011   Osteoarthritis of spine with radiculopathy, lumbosacral region 09/30/2010   Allergic rhinitis 10[birth year]31/1928    Orientation RESPIRATION BLADDER Height & Weight     Self, Time, Situation, Place  O2 (Nasal Cannula 4 L) Continent Weight: 159 lb 9.8 oz (72.4 kg) Height:  5\' 2"  (157.5 cm)  BEHAVIORAL SYMPTOMS/MOOD NEUROLOGICAL BOWEL NUTRITION STATUS   (None)  (None) Continent Diet (Heart healthy)  AMBULATORY STATUS COMMUNICATION OF NEEDS Skin   Extensive Assist Verbally PU Stage and Appropriate Care  PU Stage 3 Dressing:  (Left anterior toe: Foam prn)                 Personal Care Assistance Level of Assistance  Bathing, Feeding, Dressing Bathing Assistance: Maximum assistance Feeding assistance:  Limited assistance Dressing Assistance: Maximum assistance     Functional Limitations Info  Sight, Hearing, Speech Sight Info: Adequate Hearing Info: Adequate Speech Info: Adequate    SPECIAL CARE FACTORS FREQUENCY  PT (By licensed PT), OT (By licensed OT)     PT Frequency: 5 x week OT Frequency: 5 x week            Contractures Contractures Info: Not present    Additional Factors Info  Code Status, Allergies Code Status Info: DNR Allergies Info: Penicillins, Drug ingredient (zinc), Empagliflozin           Current Medications (07/01/2023):  This is the current hospital active medication list Current Facility-Administered Medications  Medication Dose Route Frequency Provider Last Rate Last Admin   acetaminophen (TYLENOL) tablet 650 mg  650 mg Oral Q6H PRN Cox, Amy N, DO   650 mg at 07/01/23 1058   Or   acetaminophen (TYLENOL) suppository 650 mg  650 mg Rectal Q6H PRN Cox, Amy N, DO       amLODipine (NORVASC) tablet 10 mg  10 mg Oral QHS Cox, Amy N, DO   10 mg at 06/30/23 2051   anastrozole (ARIMIDEX) tablet 1 mg  1 mg Oral Daily Cox, Amy N, DO   1 mg at 07/01/23 1039   calcium carbonate (OS-CAL - dosed in mg of elemental calcium) tablet 2,500 mg  2 tablet Oral Q breakfast Cox, Amy N, DO   2,500 mg at 07/01/23 1039   cefTRIAXone (ROCEPHIN) 1 g in sodium chloride 0.9 % 100 mL IVPB  1 g Intravenous Q24H Cox, Amy N, DO 200 mL/hr at 06/30/23 1803 1 g at 06/30/23 1803   citalopram (CELEXA) tablet 10 mg  10 mg Oral Daily Cox, Amy N, DO   10 mg at 07/01/23 1039   cyanocobalamin (VITAMIN B12) tablet 1,000 mcg  1,000 mcg Oral Daily Cox, Amy N, DO   1,000 mcg at 07/01/23 1039   diclofenac Sodium (VOLTAREN) 1 % topical gel 2 g  2 g Topical QHS Cox, Amy N, DO   2 g at 06/30/23 2056   DULoxetine (CYMBALTA) DR capsule 60 mg  60 mg Oral Daily Cox, Amy N, DO   60 mg at 07/01/23 1039   gabapentin (NEURONTIN) capsule 300 mg  300 mg Oral BID Cox, Amy N, DO   300 mg at 07/01/23 1039    guaiFENesin (ROBITUSSIN) 100 MG/5ML liquid 200 mg  200 mg Oral Q4H PRN Cox, Amy N, DO       heparin injection 5,000 Units  5,000 Units Subcutaneous Q8H Sunnie Nielsen, DO   5,000 Units at 07/01/23 1453   hydrALAZINE (APRESOLINE) tablet 10 mg  10 mg Oral Q6H PRN Cox, Amy N, DO       irbesartan (AVAPRO) tablet 150 mg  150 mg Oral Daily Cox, Amy N, DO   150 mg at 07/01/23 1039   loratadine (CLARITIN) tablet 10 mg  10 mg Oral Daily PRN Cox, Amy N, DO       melatonin tablet 2.5 mg  2.5 mg Oral Q2000 Cox, Amy N, DO   2.5 mg at 06/30/23 2051   menthol-cetylpyridinium (CEPACOL) lozenge 3 mg  1 lozenge Oral PRN Sunnie Nielsen, DO  metoprolol succinate (TOPROL-XL) 24 hr tablet 25 mg  25 mg Oral Daily Cox, Amy N, DO   25 mg at 07/01/23 1039   ondansetron (ZOFRAN) tablet 4 mg  4 mg Oral Q6H PRN Cox, Amy N, DO       Or   ondansetron (ZOFRAN) injection 4 mg  4 mg Intravenous Q6H PRN Cox, Amy N, DO       polyethylene glycol (MIRALAX / GLYCOLAX) packet 17 g  17 g Oral Q M,W,F Cox, Amy N, DO       senna (SENOKOT) tablet 8.6 mg  1 tablet Oral QODAY Cox, Amy N, DO   8.6 mg at 07/01/23 1039   simvastatin (ZOCOR) tablet 20 mg  20 mg Oral QPM Cox, Amy N, DO   20 mg at 06/30/23 1755   sodium bicarbonate tablet 650 mg  650 mg Oral Q8H PRN Cox, Amy N, DO         Discharge Medications: Please see discharge summary for a list of discharge medications.  Relevant Imaging Results:  Relevant Lab Results:   Additional Information SS#: [id number]  Margarito Liner, LCSW

## 2023-07-01 NOTE — Plan of Care (Signed)

## 2023-07-01 NOTE — Plan of Care (Signed)
  Problem: Education: Goal: Knowledge of General Education information will improve Description: Including pain rating scale, medication(s)/side effects and non-pharmacologic comfort measures Outcome: Not Progressing   Problem: Clinical Measurements: Goal: Respiratory complications will improve Outcome: Not Progressing   Problem: Activity: Goal: Risk for activity intolerance will decrease Outcome: Not Progressing   Problem: Skin Integrity: Goal: Risk for impaired skin integrity will decrease Outcome: Not Progressing

## 2023-07-01 NOTE — Progress Notes (Signed)
 PROGRESS NOTE    Tina Curtis   [name]:[id number] [name]: 11-Feb-1927  [name]: 06/29/2023 Date of Service: 07/01/23 which is hospital day 1  PCP: [name], MD    Hospital course / significant events:   HPI: [name] is a 89 year old female with history of hypertension, depression, anxiety, history of NSTEMI, diastolic heart failure, metastatic bone cancer, history of stroke on dual antiplatelet therapy, who presents to the emergency department from [facility] via EMS on 06/29/23 for chief concerns of shortness of breath and fall. Patient reports that she slid out of her chair and landed on her left hip on Saturday, 06/26/2023. She endorses swelling of both her legs. She endorses dysuria and loose stool.  Patient reports she has been having burning sensation and increased urination for several days. She has had a lot of stress lately because her youngest daughter, who is 37 has had a kidney transplant and was recently admitted to hospital and currently is on a ventilator in [place].   02/18: BNP 680. Requiring O2. Admitted to hospitalist service for HFpEF exacerbation. Diuresing.  02/19: UOP appears under-documented but pt reports peeing a lot. Reports breathing better today.  02/20: Cr up to 1.7, still on 4L O2, holding diuresis for now given renal fxn and will reassess tomorrow.      Consultants:  none  Procedures/Surgeries: none      ASSESSMENT & PLAN:   Acute exacerbation of HFpEF Furosemide 40 mg IV twice daily, 2 doses ordered for 06/30/2023 and held now d/t Cr up a bit  Patient had echo done on 05/07/2023 which was read as estimated ejection fraction is 60 to 65%, grade 1 diastolic dysfunction - will not repeat echo now given recent results  Strict I's and O's   CKD4 Question cardiorenal syndrome Following w/ nephrology outpatient Closely monitor BMP w/ diuresis Holding lasix for today   Renovascular hypertension Home amlodipine 10 mg nightly, irbesartan 150 mg  daily, metoprolol succinate 25 mg daily were resumed on admission Scheduled home hydralazine 50 mg 3 times daily with instructions to skip if SBP is less than 130 has not been resumed on admission Hydralazine 10 mg p.o. every 6 hours as needed for SBP greater 165, 4 days ordered on admission  Depression Home citalopram 10 mg daily, duloxetine 60 mg daily resumed   Dysuria, polyuria, urinary urgency Nitrofurantoin Outpatient follow-up, UA ok may need to treat for atrophic vaginitis   History of breast cancer Home anastrozole 1 mg daily resumed   Anemia in chronic kidney disease At baseline Monitor CBC, BMP    RLS (restless legs syndrome) Home gabapentin 300 mg p.o. twice daily resumed      overweight based on BMI: Body mass index is 25.97 kg/m.  Underweight - under 18  overweight - 25 to 29 obese - 30 or more Class 1 obesity: BMI of 30.0 to 34 Class 2 obesity: BMI of 35.0 to 39 Class 3 obesity: BMI of 40.0 to 49 Super Morbid Obesity: BMI 50-59 Super-super Morbid Obesity: BMI 60+ Significantly low or high BMI is associated with higher medical risk.  Weight management advised as adjunct to other disease management and risk reduction treatments    DVT prophylaxis: heparin IV fluids: no continuous IV fluids  Nutrition: cardiac diet Central lines / invasive devices: none  Code Status: DNR ACP documentation reviewed: has DNR order dated 04/24/24on file in [facility]  [facility] needs: SNF rehab return to facility likely will need PT/OT orders there  Barriers to  dispo / significant pending items: diuresing, may be medically stable next 1-2 days           Subjective / Brief ROS:  Patient reports dry throat is a bit better SOB w/ exertion No sinus pressure, no coughing  Denies CP/SOB at rest  Pain controlled.  Denies new weakness.  Tolerating diet.  Reports no concerns w/ urination/defecation - still peeing a lot but less than yesterday (no diuretics today).   Family  Communication: will call daughter laer today     Objective Findings:  Vitals:   07/01/23 0335 07/01/23 0500 07/01/23 0743 07/01/23 1218  BP: (!) 138/58  131/63 122/61  Pulse: 79  79 74  Resp: 18  16 16   Temp: 98.5 F (36.9 C)  98.8 F (37.1 C) 98.3 F (36.8 C)  TempSrc: Oral     SpO2: 95%  93% 95%  Weight:  72.4 kg    Height:        Intake/Output Summary (Last 24 hours) at 07/01/2023 1256 Last data filed at 06/30/2023 1541 Gross per 24 hour  Intake --  Output 400 ml  Net -400 ml   Filed Weights   06/29/23 0820 07/01/23 0500  Weight: 64.4 kg 72.4 kg    Examination:  Physical Exam Constitutional:      General: She is not in acute distress. Cardiovascular:     Rate and Rhythm: Normal rate and regular rhythm.  Pulmonary:     Effort: Pulmonary effort is normal.     Breath sounds: Rales (faint, improved but still present) present.  Musculoskeletal:     Right lower leg: No edema.     Left lower leg: No edema.  Neurological:     General: No focal deficit present.     Mental Status: She is alert and oriented to person, place, and time.  Psychiatric:        Mood and Affect: Mood normal.        Behavior: Behavior normal.          Scheduled Medications:   amLODipine  10 mg Oral QHS   anastrozole  1 mg Oral Daily   calcium carbonate  2 tablet Oral Q breakfast   citalopram  10 mg Oral Daily   cyanocobalamin  1,000 mcg Oral Daily   diclofenac Sodium  2 g Topical QHS   DULoxetine  60 mg Oral Daily   gabapentin  300 mg Oral BID   heparin injection (subcutaneous)  5,000 Units Subcutaneous Q8H   irbesartan  150 mg Oral Daily   melatonin  2.5 mg Oral Q2000   metoprolol succinate  25 mg Oral Daily   polyethylene glycol  17 g Oral Q M,W,F   senna  1 tablet Oral QODAY   simvastatin  20 mg Oral QPM    Continuous Infusions:  cefTRIAXone (ROCEPHIN)  IV 1 g (06/30/23 1803)    PRN Medications:  acetaminophen **OR** acetaminophen, guaiFENesin, hydrALAZINE,  loratadine, menthol-cetylpyridinium, ondansetron **OR** ondansetron (ZOFRAN) IV, sodium bicarbonate  Antimicrobials from admission:  Anti-infectives (From admission, onward)    Start     Dose/Rate Route Frequency Ordered Stop   06/29/23 2200  nitrofurantoin (macrocrystal-monohydrate) (MACROBID) capsule 100 mg  Status:  Discontinued        100 mg Oral Every 12 hours 06/29/23 1559 06/29/23 1628   06/29/23 1800  cefTRIAXone (ROCEPHIN) 1 g in sodium chloride 0.9 % 100 mL IVPB        1 g 200 mL/hr over 30 Minutes Intravenous  Every 24 hours 06/29/23 1625             Data Reviewed:  I have personally reviewed the following...  CBC: Recent Labs  Lab 06/29/23 0823 06/30/23 0452 07/01/23 0510  WBC 10.4 7.8 7.1  NEUTROABS 6.9  --   --   HGB 10.0* 9.4* 9.6*  HCT 29.8* 28.2* 28.4*  MCV 92.3 91.3 91.0  PLT 234 235 233   Basic Metabolic Panel: Recent Labs  Lab 06/29/23 0823 06/30/23 0452 07/01/23 0510  NA 139 139 141  K 4.0 3.6 3.5  CL 107 107 105  CO2 22 22 25   GLUCOSE 162* 125* 141*  BUN 27* 25* 27*  CREATININE 1.54* 1.69* 1.74*  CALCIUM 8.9 8.9 9.0   GFR: Estimated Creatinine Clearance: 18.4 mL/min (A) (by C-G formula based on SCr of 1.74 mg/dL (H)). Liver Function Tests: Recent Labs  Lab 06/29/23 0823  AST 21  ALT 11  ALKPHOS 61  BILITOT 0.7  PROT 7.0  ALBUMIN 3.5   No results for input(s): "LIPASE", "AMYLASE" in the last 168 hours. No results for input(s): "AMMONIA" in the last 168 hours. Coagulation Profile: Recent Labs  Lab 06/29/23 0823  INR 1.1   Cardiac Enzymes: No results for input(s): "CKTOTAL", "CKMB", "CKMBINDEX", "TROPONINI" in the last 168 hours. BNP (last 3 results) No results for input(s): "PROBNP" in the last 8760 hours. HbA1C: No results for input(s): "HGBA1C" in the last 72 hours. CBG: No results for input(s): "GLUCAP" in the last 168 hours. Lipid Profile: No results for input(s): "CHOL", "HDL", "LDLCALC", "TRIG", "CHOLHDL",  "LDLDIRECT" in the last 72 hours. Thyroid Function Tests: No results for input(s): "TSH", "T4TOTAL", "FREET4", "T3FREE", "THYROIDAB" in the last 72 hours. Anemia Panel: No results for input(s): "VITAMINB12", "FOLATE", "FERRITIN", "TIBC", "IRON", "RETICCTPCT" in the last 72 hours. Most Recent Urinalysis On File:     Component Value Date/Time   COLORURINE STRAW (A) 06/29/2023 1231   APPEARANCEUR CLEAR (A) 06/29/2023 1231   LABSPEC 1.013 06/29/2023 1231   PHURINE 8.0 06/29/2023 1231   GLUCOSEU NEGATIVE 06/29/2023 1231   HGBUR NEGATIVE 06/29/2023 1231   BILIRUBINUR NEGATIVE 06/29/2023 1231   BILIRUBINUR negative 03/18/2022 1614   KETONESUR NEGATIVE 06/29/2023 1231   PROTEINUR NEGATIVE 06/29/2023 1231   UROBILINOGEN 0.2 03/18/2022 1614   NITRITE NEGATIVE 06/29/2023 1231   LEUKOCYTESUR NEGATIVE 06/29/2023 1231   Sepsis Labs: @LABRCNTIP (procalcitonin:4,lacticidven:4) Microbiology: Recent Results (from the past 240 hours)  Blood Culture (routine x 2)     Status: None (Preliminary result)   Collection Time: 06/29/23  8:23 AM   Specimen: BLOOD  Result Value Ref Range Status   Specimen Description BLOOD LEFT ANTECUBITAL  Final   Special Requests   Final    BOTTLES DRAWN AEROBIC AND ANAEROBIC Blood Culture adequate volume   Culture   Final    NO GROWTH 2 DAYS Performed at [facility], [address] [place], [place] [address]    Report Status PENDING  Incomplete  Blood Culture (routine x 2)     Status: None (Preliminary result)   Collection Time: 06/29/23  8:28 AM   Specimen: BLOOD  Result Value Ref Range Status   Specimen Description BLOOD BLOOD LEFT FOREARM  Final   Special Requests   Final    BOTTLES DRAWN AEROBIC AND ANAEROBIC Blood Culture results may not be optimal due to an inadequate volume of blood received in culture bottles   Culture   Final    NO GROWTH 2 DAYS Performed at  [facility], [address], [place], [place] [address]    Report Status  PENDING  Incomplete  Resp panel by RT-PCR (RSV, Flu A&B, Covid) Anterior Nasal Swab     Status: None   Collection Time: 06/29/23  8:54 AM   Specimen: Anterior Nasal Swab  Result Value Ref Range Status   SARS Coronavirus 2 by RT PCR NEGATIVE NEGATIVE Final    Comment: (NOTE) SARS-CoV-2 target nucleic acids are NOT DETECTED.  The SARS-CoV-2 RNA is generally detectable in upper respiratory specimens during the acute phase of infection. The lowest concentration of SARS-CoV-2 viral copies this assay can detect is 138 copies/mL. A negative result does not preclude SARS-Cov-2 infection and should not be used as the sole basis for treatment or other patient management decisions. A negative result may occur with  improper specimen collection/handling, submission of specimen other than nasopharyngeal swab, presence of viral mutation(s) within the areas targeted by this assay, and inadequate number of viral copies(<138 copies/mL). A negative result must be combined with clinical observations, patient history, and epidemiological information. The expected result is Negative.  Fact Sheet for Patients:  BloggerCourse.com  Fact Sheet for Healthcare Providers:  SeriousBroker.it  This test is no t yet approved or cleared by the [place] FDA and  has been authorized for detection and/or diagnosis of SARS-CoV-2 by FDA under an Emergency Use Authorization (EUA). This EUA will remain  in effect (meaning this test can be used) for the duration of the COVID-19 declaration under Section 564(b)(1) of the Act, 21 U.S.C.section 360bbb-3(b)(1), unless the authorization is terminated  or revoked sooner.       Influenza A by PCR NEGATIVE NEGATIVE Final   Influenza B by PCR NEGATIVE NEGATIVE Final    Comment: (NOTE) The Xpert Xpress SARS-CoV-2/FLU/RSV plus assay is intended as an aid in the diagnosis of influenza from Nasopharyngeal swab specimens  and should not be used as a sole basis for treatment. Nasal washings and aspirates are unacceptable for Xpert Xpress SARS-CoV-2/FLU/RSV testing.  Fact Sheet for Patients: BloggerCourse.com  Fact Sheet for Healthcare Providers: SeriousBroker.it  This test is not yet approved or cleared by the [place] FDA and has been authorized for detection and/or diagnosis of SARS-CoV-2 by FDA under an Emergency Use Authorization (EUA). This EUA will remain in effect (meaning this test can be used) for the duration of the COVID-19 declaration under Section 564(b)(1) of the Act, 21 U.S.C. section 360bbb-3(b)(1), unless the authorization is terminated or revoked.     Resp Syncytial Virus by PCR NEGATIVE NEGATIVE Final    Comment: (NOTE) Fact Sheet for Patients: BloggerCourse.com  Fact Sheet for Healthcare Providers: SeriousBroker.it  This test is not yet approved or cleared by the [place] FDA and has been authorized for detection and/or diagnosis of SARS-CoV-2 by FDA under an Emergency Use Authorization (EUA). This EUA will remain in effect (meaning this test can be used) for the duration of the COVID-19 declaration under Section 564(b)(1) of the Act, 21 U.S.C. section 360bbb-3(b)(1), unless the authorization is terminated or revoked.  Performed at [facility], [address] [place], [place] [address]       Radiology Studies last 3 days: CT Angio Chest PE W and/or Wo Contrast Result Date: 06/29/2023 CLINICAL DATA:  Shortness of breath and hypoxia. Concern for pulmonary embolism. Recent fall. EXAM: CT ANGIOGRAPHY CHEST WITH CONTRAST TECHNIQUE: Multidetector CT imaging of the chest was performed using the standard protocol during bolus administration of intravenous contrast. Multiplanar CT  image reconstructions and MIPs were obtained to evaluate the vascular anatomy.  RADIATION DOSE REDUCTION: This exam was performed according to the departmental dose-optimization program which includes automated exposure control, adjustment of the mA and/or kV according to patient size and/or use of iterative reconstruction technique. CONTRAST:  50mL OMNIPAQUE IOHEXOL 350 MG/ML SOLN COMPARISON:  Chest CT dated 03/29/2023. Radiograph dated 06/29/2023. FINDINGS: Cardiovascular: Mild cardiomegaly. No pericardial effusion. There is coronary vascular calcification. Moderate calcified and noncalcified plaque of the thoracic aorta. No aneurysmal dilatation or dissection. The origins of the great vessels of the aortic arch appear patent. Evaluation of the pulmonary arteries is limited due to respiratory motion. No pulmonary artery embolus identified. Mediastinum/Nodes: No hilar or mediastinal adenopathy. The esophagus is grossly unremarkable. No mediastinal fluid collection. Lungs/Pleura: Small left and moderate right pleural effusions with partial compressive atelectasis of the lower lobes. There is diffuse interstitial and interlobular septal prominence consistent with edema. There is no pneumothorax. The central airways are patent. Upper Abdomen: Indeterminate cystic pancreatic lesions as seen on the prior CT and better evaluated on the CT abdomen pelvis of 08/22/2022. Musculoskeletal: Osteopenia with degenerative changes of the spine. No acute osseous pathology. There is stranding and edema of the chest wall. Review of the MIP images confirms the above findings. IMPRESSION: 1. No CT evidence of pulmonary artery embolus. 2. Mild cardiomegaly with pulmonary edema and bilateral pleural effusions, right greater than left. 3. Bilateral lower lobe atelectasis or pneumonia. 4.  Aortic Atherosclerosis (ICD10-I70.0). Electronically Signed   By: Elgie Collard M.D.   On: 06/29/2023 12:17   DG Hip Unilat W or Wo Pelvis 2-3 Views Left Result Date: 06/29/2023 CLINICAL DATA:  Left hip pain after a fall a few  days ago EXAM: DG HIP (WITH OR WITHOUT PELVIS) 2-3V LEFT COMPARISON:  05/06/2023 FINDINGS: AP view of the pelvis and AP/frog leg views of the left hip. Femoral heads are located. Degenerative sclerosis of the left sacroiliac joint. No acute fracture. IMPRESSION: No acute osseous abnormality. Electronically Signed   By: Jeronimo Greaves M.D.   On: 06/29/2023 10:26   DG Chest Port 1 View Result Date: 06/29/2023 CLINICAL DATA:  Short of breath and hypoxic. EXAM: PORTABLE CHEST 1 VIEW COMPARISON:  08/26/2022 FINDINGS: Surgical clips project over the right axilla. Patient is rotated to the right. Cardiomegaly accentuated by AP portable technique. Atherosclerosis in the transverse aorta. Probable small right pleural effusion. No pneumothorax. Progressive interstitial prominence and indistinctness. Similar left and increased right base airspace disease. IMPRESSION: Moderate congestive heart failure. New or increased small right pleural effusion. Persistent left and increased right lower lung airspace disease. Most likely atelectasis. Cannot exclude concurrent pneumonia or aspiration. Electronically Signed   By: Jeronimo Greaves M.D.   On: 06/29/2023 10:23        Sunnie Nielsen, DO [facility] 07/01/2023, 12:56 PM    Dictation software may have been used to generate the above note. Typos may occur and escape review in typed/dictated notes. Please contact Dr Lyn Hollingshead directly for clarity if needed.  Staff may message me via secure chat in Epic  but this may not receive an immediate response,  please page me for urgent matters!  If 7PM-7AM, please contact night coverage [email or web address]

## 2023-07-02 DIAGNOSIS — I5033 Acute on chronic diastolic (congestive) heart failure: Secondary | ICD-10-CM | POA: Diagnosis not present

## 2023-07-02 LAB — BASIC METABOLIC PANEL
Anion gap: 9 (ref 5–15)
BUN: 27 mg/dL — ABNORMAL HIGH (ref 8–23)
CO2: 25 mmol/L (ref 22–32)
Calcium: 9.1 mg/dL (ref 8.9–10.3)
Chloride: 105 mmol/L (ref 98–111)
Creatinine, Ser: 1.58 mg/dL — ABNORMAL HIGH (ref 0.44–1.00)
GFR, Estimated: 30 mL/min — ABNORMAL LOW (ref >60)
Glucose, Bld: 115 mg/dL — ABNORMAL HIGH (ref 70–99)
Potassium: 3.6 mmol/L (ref 3.5–5.1)
Sodium: 139 mmol/L (ref 135–145)

## 2023-07-02 MED ORDER — FUROSEMIDE 10 MG/ML IJ SOLN
40.0000 mg | Freq: Once | INTRAMUSCULAR | Status: AC
Start: 2023-07-02 — End: 2023-07-02
  Administered 2023-07-02: 40 mg via INTRAVENOUS
  Filled 2023-07-02: qty 4

## 2023-07-02 MED ORDER — OXYCODONE HCL 5 MG PO TABS
5.0000 mg | ORAL_TABLET | Freq: Three times a day (TID) | ORAL | Status: DC
  Filled 2023-07-02: qty 1

## 2023-07-02 NOTE — Progress Notes (Signed)
 Physical Therapy Treatment Patient Details Name: RAVAN SCHLEMMER MRN: [id number] [name]: 1927/03/27 Today's Date: 07/02/2023   History of Present Illness Carolena Fairbank is a 89yoF who [facility]23 with SOB and Left hip soreness after a fall at facility. PMH: HTN, depression, GAD, NSTEMI, dCHF, metastatic bone cancer, CVA. Imaging left hip not revealing of any acute abnormality. RSV (-). Pt admitted in COPD exacerbation.    PT Comments  Pt asleep on entry, easily made awake. Meal finished, pt voices disappointment that no one would help her sit up higher in the bed for lunch and the table position made it difficult to use her LUE which is chronically weak from remote neurological event. O2 still doffed from where she removed it for eating and could not find it again to don in her postprandial state: sats at 95%, later 88% with activity on room air. Pt modA to EOB, we use ROM therapy to reduce arthrogenic pain prior to transfers. Spent rest of session practicing transfers technique and achieving/maintaining standing balance. Balance success remains variable- factors include disequilibrium, neurologic weakness, delayed reaction time, and forgetfulness. Despite this, a good time was had and pt remains diligently motivated to regain her prior agile self. Pt assisted back to supine, all needs met. Will continue to follow.    If plan is discharge home, recommend the following: A lot of help with bathing/dressing/bathroom;A lot of help with walking and/or transfers;Assist for transportation;Help with stairs or ramp for entrance   Can travel by private vehicle     No  Equipment Recommendations  None recommended by PT    Recommendations for Other Services       Precautions / Restrictions Precautions Precautions: Fall Restrictions Weight Bearing Restrictions Per Provider Order: No     Mobility  Bed Mobility Overal bed mobility: Needs Assistance Bed Mobility: Sit to Supine, Supine to Sit      Supine to sit: Mod assist Sit to supine: Mod assist   General bed mobility comments: LLE pain remain fairly limiting    Transfers Overall transfer level: Needs assistance Equipment used: Rolling walker (2 wheels) Transfers: Sit to/from Stand Sit to Stand: Contact guard assist, From elevated surface           General transfer comment: several minutes (most of session) spent standing at bedside working on static balance with RW; frequent LOB, worse by dual tasking and distraction, poor-zero capacity for righting posterior LOB.    Ambulation/Gait Ambulation/Gait assistance:  (deferred due to inconsistent ability to balance)           Pre-gait activities: marching in place at bed side     Stairs             Wheelchair Mobility     Tilt Bed    Modified Rankin (Stroke Patients Only)       Balance                                            Communication    Cognition Arousal: Alert Behavior During Therapy: WFL for tasks assessed/performed                           PT - Cognition Comments: mild moderate cognitive impairment suspected, but largely WNL for session        Cueing    Exercises  Other Exercises Other Exercises: marching in place with RW support 1x16; frequent posterior LOB, pulls walker up instead for pushing down Other Exercises: static standing balance with alternating hands self booping- struggles with multistep cognifitve sequecing part and LOB about every other boop Other Exercises: supine LLE AA/ROM: alternating hip/knee flexion and hip/knee extension to 0 degrees (improving left knee and hip groin pain) Other Exercises: seated LAQ 1x15 bilat Other Exercises: MMT: Left hamstrings 4+/5 (similar to prior assessment in November)    General Comments        Pertinent Vitals/Pain Pain Assessment Pain Assessment: Faces Faces Pain Scale: Hurts a little bit Pain Location: L hip/L leg Pain Intervention(s):  Limited activity within patient's tolerance, Monitored during session    Home Living                          Prior Function            PT Goals (current goals can now be found in the care plan section) Acute Rehab PT Goals Patient Stated Goal: regain strength and hav less Left hip pain PT Goal Formulation: With patient Time For Goal Achievement: 07/14/23 Potential to Achieve Goals: Good Progress towards PT goals: Progressing toward goals    Frequency    Min 1X/week      PT Plan      Co-evaluation              AM-PAC PT "6 Clicks" Mobility   Outcome Measure  Help needed turning from your back to your side while in a flat bed without using bedrails?: A Lot Help needed moving from lying on your back to sitting on the side of a flat bed without using bedrails?: A Lot Help needed moving to and from a bed to a chair (including a wheelchair)?: A Lot Help needed standing up from a chair using your arms (e.g., wheelchair or bedside chair)?: A Lot Help needed to walk in hospital room?: A Lot Help needed climbing 3-5 steps with a railing? : A Lot 6 Click Score: 12    End of Session Equipment Utilized During Treatment: Oxygen Activity Tolerance: Patient tolerated treatment well Patient left: with call bell/phone within reach;in bed;with bed alarm set Nurse Communication: Mobility status PT Visit Diagnosis: Unsteadiness on feet (R26.81);Repeated falls (R29.6);Other abnormalities of gait and mobility (R26.89);Muscle weakness (generalized) (M62.81);History of falling (Z91.81)     Time: 1340-1416 PT Time Calculation (min) (ACUTE ONLY): 36 min  Charges:    $Therapeutic Activity: 8-22 mins $Neuromuscular Re-education: 8-22 mins PT General Charges $$ ACUTE PT VISIT: 1 Visit                    4:13 PM, 07/02/23 Rosamaria Lints, PT, DPT Physical Therapist - [facility]  [phone] (ASCOM)    Asenath Balash C 07/02/2023,  4:06 PM

## 2023-07-02 NOTE — Plan of Care (Signed)

## 2023-07-02 NOTE — Progress Notes (Signed)
 Heart Failure Navigator Progress Note  Assessed for Heart & Vascular TOC clinic readiness.  Patient does not meet criteria due to [facility] patient.  Navigator will sign off at this time.   Roxy Horseman, RN, BSN [facility] Heart Failure Navigator Secure Chat Only

## 2023-07-02 NOTE — TOC Progression Note (Addendum)
 Transition of Care [facility]) - Progression Note    Patient Details  Name: Tina Curtis MRN: [id number] Date of Birth: [birth year]  Transition of Care [facility]) CM/SW Contact  Margarito Liner, LCSW Phone Number: 07/02/2023, 8:45 AM  Clinical Narrative: SNF insurance authorization is still pending.    1:42 pm: Auth approved: [id number]. Valid 2/22-2/25. CSW left message for [facility] admissions coordinator to notify. Per attending physician, potential discharge tomorrow.  Expected Discharge Plan: Skilled Nursing Facility Barriers to Discharge: Continued Medical Work up  Expected Discharge Plan and Services     Post Acute Care Choice: Resumption of Svcs/PTA Provider Living arrangements for the past 2 months: Skilled Nursing Facility                                       Social Determinants of Health (SDOH) Interventions SDOH Screenings   Food Insecurity: No Food Insecurity (06/30/2023)  Housing: Low Risk  (06/30/2023)  Transportation Needs: No Transportation Needs (06/30/2023)  Utilities: Not At Risk (06/30/2023)  Financial Resource Strain: Low Risk  (11/17/2022)   Received from [facility], [facility]  Social Connections: Unknown (06/30/2023)  Tobacco Use: Low Risk  (06/29/2023)    Readmission Risk Interventions    05/07/2023   12:51 PM 06/08/2022    3:30 PM  Readmission Risk Prevention Plan  Transportation Screening Complete Complete  PCP or Specialist Appt within 3-5 Days Complete   HRI or Home Care Consult Complete   Social Work Consult for Recovery Care Planning/Counseling Complete   Palliative Care Screening Complete Not Applicable  Medication Review Oceanographer) Complete Complete

## 2023-07-02 NOTE — Plan of Care (Signed)
  Problem: Clinical Measurements: Goal: Ability to maintain clinical measurements within normal limits will improve Outcome: Progressing Goal: Cardiovascular complication will be avoided Outcome: Progressing   Problem: Elimination: Goal: Will not experience complications related to bowel motility Outcome: Progressing Goal: Will not experience complications related to urinary retention Outcome: Progressing   Problem: Clinical Measurements: Goal: Respiratory complications will improve Outcome: Not Progressing   Problem: Activity: Goal: Risk for activity intolerance will decrease Outcome: Not Progressing   Problem: Coping: Goal: Level of anxiety will decrease Outcome: Not Progressing   Problem: Pain Managment: Goal: General experience of comfort will improve and/or be controlled Outcome: Not Progressing   Problem: Activity: Goal: Capacity to carry out activities will improve Outcome: Not Progressing

## 2023-07-02 NOTE — Progress Notes (Addendum)
 PROGRESS NOTE    Tina Curtis   [name]:[id number] [name]: [birth year]  [name]: 06/29/2023 Date of Service: 07/02/23 which is hospital day 2  PCP: [name], MD    Hospital course / significant events:   HPI: Tina Curtis is a 89 year old female with history of hypertension, depression, anxiety, history of NSTEMI, diastolic heart failure, metastatic bone cancer, history of stroke on dual antiplatelet therapy, who presents to the emergency department from [facility] via EMS on 06/29/23 for chief concerns of shortness of breath and fall. Patient reports that she slid out of her chair and landed on her left hip on Saturday, 06/26/2023. She endorses swelling of both her legs. She endorses dysuria and loose stool.  Patient reports she has been having burning sensation and increased urination for several days. She has had a lot of stress lately because her youngest daughter, who is 85 has had a kidney transplant and was recently admitted to hospital and currently is on a ventilator in [place].   02/18: BNP 680. Requiring O2. Admitted to hospitalist service for HFpEF exacerbation. Diuresing.  02/19: UOP appears under-documented but pt reports peeing a lot. Reports breathing better today.  02/20: Cr up to 1.7, still on 4L O2, holding diuresis for now given renal fxn and will reassess tomorrow.  02/21: Cr 1.5. still needing 4L O2, dosed another lasix IV. Trial wen to 2-3L O2 later today     Consultants:  none  Procedures/Surgeries: none      ASSESSMENT & PLAN:   Acute exacerbation of HFpEF Furosemide 40 mg IV twice daily, 2 doses ordered for 06/30/2023 and held 2/20 d/t Cr up a bit, single IV dose 07/02/23  Patient had echo done on 05/07/2023 which was read as estimated ejection fraction is 60 to 65%, grade 1 diastolic dysfunction - will not repeat echo now given recent results  Strict I's and O's   CKD4 Question cardiorenal syndrome Following w/ nephrology outpatient Closely monitor  BMP w/ diuresis  Renovascular hypertension Home amlodipine 10 mg nightly, irbesartan 150 mg daily, metoprolol succinate 25 mg daily were resumed on admission Scheduled home hydralazine 50 mg 3 times daily with instructions to skip if SBP is less than 130 has not been resumed on admission Hydralazine 10 mg p.o. every 6 hours as needed for SBP greater 165, 4 days ordered on admission  Depression Home citalopram 10 mg daily, duloxetine 60 mg daily resumed   Dysuria, polyuria, urinary urgency Nitrofurantoin Outpatient follow-up, UA ok may need to treat for atrophic vaginitis   History of breast cancer Home anastrozole 1 mg daily resumed   Anemia in chronic kidney disease At baseline Monitor CBC, BMP    RLS (restless legs syndrome) Home gabapentin 300 mg p.o. twice daily resumed   Chronic low back pain Pt upset she wasn't getting pain meds, feels not checked in on frequently but she is also not using call bell "I dont want to complain"  scheduled pain rx    overweight based on BMI: Body mass index is 25.97 kg/m.  Underweight - under 18  overweight - 25 to 29 obese - 30 or more Class 1 obesity: BMI of 30.0 to 34 Class 2 obesity: BMI of 35.0 to 39 Class 3 obesity: BMI of 40.0 to 49 Super Morbid Obesity: BMI 50-59 Super-super Morbid Obesity: BMI 60+ Significantly low or high BMI is associated with higher medical risk.  Weight management advised as adjunct to other disease management and risk reduction treatments  DVT prophylaxis: heparin IV fluids: no continuous IV fluids  Nutrition: cardiac diet Central lines / invasive devices: none  Code Status: DNR ACP documentation reviewed: has DNR order dated 04/24/24on file in [facility]  [facility] needs: SNF rehab return to facility likely will need PT/OT orders there  Barriers to dispo / significant pending items: diuresing, may be medically stable next 1-2 days           Subjective / Brief ROS:  Patient reports dry throat is  a bit better SOB w/ exertion No sinus pressure, no coughing  Denies CP/SOB at rest  Pain worse today, chronic back pain  Denies new weakness.  Tolerating diet.   Family Communication: spoke w/ daughter 07/02/23 3:37 PM on the phone     Objective Findings:  Vitals:   07/02/23 0437 07/02/23 0456 07/02/23 0739 07/02/23 1130  BP: (!) 127/57  136/71 (!) 157/78  Pulse:   76 86  Resp: 18  18 18   Temp: 98.3 F (36.8 C)  98.9 F (37.2 C) 98.9 F (37.2 C)  TempSrc: Axillary     SpO2: 96%  93% 97%  Weight:  67.8 kg    Height:        Intake/Output Summary (Last 24 hours) at 07/02/2023 1313 Last data filed at 07/02/2023 1132 Gross per 24 hour  Intake --  Output 701 ml  Net -701 ml   Filed Weights   06/29/23 0820 07/01/23 0500 07/02/23 0456  Weight: 64.4 kg 72.4 kg 67.8 kg    Examination:  Physical Exam Constitutional:      General: She is not in acute distress. Cardiovascular:     Rate and Rhythm: Normal rate and regular rhythm.  Pulmonary:     Effort: Pulmonary effort is normal.     Breath sounds: Rales (faint, improved but still present) present.  Musculoskeletal:     Right lower leg: No edema.     Left lower leg: No edema.  Neurological:     General: No focal deficit present.     Mental Status: She is alert and oriented to person, place, and time.  Psychiatric:        Mood and Affect: Mood normal.        Behavior: Behavior normal.          Scheduled Medications:   amLODipine  10 mg Oral QHS   anastrozole  1 mg Oral Daily   calcium carbonate  2 tablet Oral Q breakfast   citalopram  10 mg Oral Daily   cyanocobalamin  1,000 mcg Oral Daily   diclofenac Sodium  2 g Topical QHS   DULoxetine  60 mg Oral Daily   gabapentin  300 mg Oral BID   heparin injection (subcutaneous)  5,000 Units Subcutaneous Q8H   irbesartan  150 mg Oral Daily   melatonin  2.5 mg Oral Q2000   metoprolol succinate  25 mg Oral Daily   oxyCODONE  5 mg Oral Q8H   polyethylene glycol   17 g Oral Q M,W,F   senna  1 tablet Oral QODAY   simvastatin  20 mg Oral QPM    Continuous Infusions:  cefTRIAXone (ROCEPHIN)  IV 1 g (07/01/23 1714)    PRN Medications:  acetaminophen **OR** acetaminophen, guaiFENesin, hydrALAZINE, loratadine, menthol-cetylpyridinium, ondansetron **OR** ondansetron (ZOFRAN) IV, sodium bicarbonate  Antimicrobials from admission:  Anti-infectives (From admission, onward)    Start     Dose/Rate Route Frequency Ordered Stop   06/29/23 2200  nitrofurantoin (macrocrystal-monohydrate) (MACROBID) capsule 100  mg  Status:  Discontinued        100 mg Oral Every 12 hours 06/29/23 1559 06/29/23 1628   06/29/23 1800  cefTRIAXone (ROCEPHIN) 1 g in sodium chloride 0.9 % 100 mL IVPB        1 g 200 mL/hr over 30 Minutes Intravenous Every 24 hours 06/29/23 1625             Data Reviewed:  I have personally reviewed the following...  CBC: Recent Labs  Lab 06/29/23 0823 06/30/23 0452 07/01/23 0510  WBC 10.4 7.8 7.1  NEUTROABS 6.9  --   --   HGB 10.0* 9.4* 9.6*  HCT 29.8* 28.2* 28.4*  MCV 92.3 91.3 91.0  PLT 234 235 233   Basic Metabolic Panel: Recent Labs  Lab 06/29/23 0823 06/30/23 0452 07/01/23 0510 07/02/23 0504  NA 139 139 141 139  K 4.0 3.6 3.5 3.6  CL 107 107 105 105  CO2 22 22 25 25   GLUCOSE 162* 125* 141* 115*  BUN 27* 25* 27* 27*  CREATININE 1.54* 1.69* 1.74* 1.58*  CALCIUM 8.9 8.9 9.0 9.1   GFR: Estimated Creatinine Clearance: 19.7 mL/min (A) (by C-G formula based on SCr of 1.58 mg/dL (H)). Liver Function Tests: Recent Labs  Lab 06/29/23 0823  AST 21  ALT 11  ALKPHOS 61  BILITOT 0.7  PROT 7.0  ALBUMIN 3.5   No results for input(s): "LIPASE", "AMYLASE" in the last 168 hours. No results for input(s): "AMMONIA" in the last 168 hours. Coagulation Profile: Recent Labs  Lab 06/29/23 0823  INR 1.1   Cardiac Enzymes: No results for input(s): "CKTOTAL", "CKMB", "CKMBINDEX", "TROPONINI" in the last 168 hours. BNP (last  3 results) No results for input(s): "PROBNP" in the last 8760 hours. HbA1C: No results for input(s): "HGBA1C" in the last 72 hours. CBG: No results for input(s): "GLUCAP" in the last 168 hours. Lipid Profile: No results for input(s): "CHOL", "HDL", "LDLCALC", "TRIG", "CHOLHDL", "LDLDIRECT" in the last 72 hours. Thyroid Function Tests: No results for input(s): "TSH", "T4TOTAL", "FREET4", "T3FREE", "THYROIDAB" in the last 72 hours. Anemia Panel: No results for input(s): "VITAMINB12", "FOLATE", "FERRITIN", "TIBC", "IRON", "RETICCTPCT" in the last 72 hours. Most Recent Urinalysis On File:     Component Value Date/Time   COLORURINE STRAW (A) 06/29/2023 1231   APPEARANCEUR CLEAR (A) 06/29/2023 1231   LABSPEC 1.013 06/29/2023 1231   PHURINE 8.0 06/29/2023 1231   GLUCOSEU NEGATIVE 06/29/2023 1231   HGBUR NEGATIVE 06/29/2023 1231   BILIRUBINUR NEGATIVE 06/29/2023 1231   BILIRUBINUR negative 03/18/2022 1614   KETONESUR NEGATIVE 06/29/2023 1231   PROTEINUR NEGATIVE 06/29/2023 1231   UROBILINOGEN 0.2 03/18/2022 1614   NITRITE NEGATIVE 06/29/2023 1231   LEUKOCYTESUR NEGATIVE 06/29/2023 1231   Sepsis Labs: @LABRCNTIP (procalcitonin:4,lacticidven:4) Microbiology: Recent Results (from the past 240 hours)  Blood Culture (routine x 2)     Status: None (Preliminary result)   Collection Time: 06/29/23  8:23 AM   Specimen: BLOOD  Result Value Ref Range Status   Specimen Description BLOOD LEFT ANTECUBITAL  Final   Special Requests   Final    BOTTLES DRAWN AEROBIC AND ANAEROBIC Blood Culture adequate volume   Culture   Final    NO GROWTH 3 DAYS Performed at [facility], [address] [place], [place] [address]    Report Status PENDING  Incomplete  Blood Culture (routine x 2)     Status: None (Preliminary result)   Collection Time: 06/29/23  8:28 AM   Specimen: BLOOD  Result Value Ref Range Status   Specimen Description BLOOD BLOOD LEFT FOREARM  Final   Special Requests   Final     BOTTLES DRAWN AEROBIC AND ANAEROBIC Blood Culture results may not be optimal due to an inadequate volume of blood received in culture bottles   Culture   Final    NO GROWTH 3 DAYS Performed at [facility], [address] [place], [place] [address]    Report Status PENDING  Incomplete  Resp panel by RT-PCR (RSV, Flu A&B, Covid) Anterior Nasal Swab     Status: None   Collection Time: 06/29/23  8:54 AM   Specimen: Anterior Nasal Swab  Result Value Ref Range Status   SARS Coronavirus 2 by RT PCR NEGATIVE NEGATIVE Final    Comment: (NOTE) SARS-CoV-2 target nucleic acids are NOT DETECTED.  The SARS-CoV-2 RNA is generally detectable in upper respiratory specimens during the acute phase of infection. The lowest concentration of SARS-CoV-2 viral copies this assay can detect is 138 copies/mL. A negative result does not preclude SARS-Cov-2 infection and should not be used as the sole basis for treatment or other patient management decisions. A negative result may occur with  improper specimen collection/handling, submission of specimen other than nasopharyngeal swab, presence of viral mutation(s) within the areas targeted by this assay, and inadequate number of viral copies(<138 copies/mL). A negative result must be combined with clinical observations, patient history, and epidemiological information. The expected result is Negative.  Fact Sheet for Patients:  BloggerCourse.com  Fact Sheet for Healthcare Providers:  SeriousBroker.it  This test is no t yet approved or cleared by the [place] FDA and  has been authorized for detection and/or diagnosis of SARS-CoV-2 by FDA under an Emergency Use Authorization (EUA). This EUA will remain  in effect (meaning this test can be used) for the duration of the COVID-19 declaration under Section 564(b)(1) of the Act, 21 U.S.C.section 360bbb-3(b)(1), unless the authorization is  terminated  or revoked sooner.       Influenza A by PCR NEGATIVE NEGATIVE Final   Influenza B by PCR NEGATIVE NEGATIVE Final    Comment: (NOTE) The Xpert Xpress SARS-CoV-2/FLU/RSV plus assay is intended as an aid in the diagnosis of influenza from Nasopharyngeal swab specimens and should not be used as a sole basis for treatment. Nasal washings and aspirates are unacceptable for Xpert Xpress SARS-CoV-2/FLU/RSV testing.  Fact Sheet for Patients: BloggerCourse.com  Fact Sheet for Healthcare Providers: SeriousBroker.it  This test is not yet approved or cleared by the [place] FDA and has been authorized for detection and/or diagnosis of SARS-CoV-2 by FDA under an Emergency Use Authorization (EUA). This EUA will remain in effect (meaning this test can be used) for the duration of the COVID-19 declaration under Section 564(b)(1) of the Act, 21 U.S.C. section 360bbb-3(b)(1), unless the authorization is terminated or revoked.     Resp Syncytial Virus by PCR NEGATIVE NEGATIVE Final    Comment: (NOTE) Fact Sheet for Patients: BloggerCourse.com  Fact Sheet for Healthcare Providers: SeriousBroker.it  This test is not yet approved or cleared by the [place] FDA and has been authorized for detection and/or diagnosis of SARS-CoV-2 by FDA under an Emergency Use Authorization (EUA). This EUA will remain in effect (meaning this test can be used) for the duration of the COVID-19 declaration under Section 564(b)(1) of the Act, 21 U.S.C. section 360bbb-3(b)(1), unless the authorization is terminated or revoked.  Performed at [facility], [address] [place], [place] [address]  Radiology Studies last 3 days: CT Angio Chest PE W and/or Wo Contrast Result Date: 06/29/2023 CLINICAL DATA:  Shortness of breath and hypoxia. Concern for pulmonary embolism. Recent  fall. EXAM: CT ANGIOGRAPHY CHEST WITH CONTRAST TECHNIQUE: Multidetector CT imaging of the chest was performed using the standard protocol during bolus administration of intravenous contrast. Multiplanar CT image reconstructions and MIPs were obtained to evaluate the vascular anatomy. RADIATION DOSE REDUCTION: This exam was performed according to the departmental dose-optimization program which includes automated exposure control, adjustment of the mA and/or kV according to patient size and/or use of iterative reconstruction technique. CONTRAST:  50mL OMNIPAQUE IOHEXOL 350 MG/ML SOLN COMPARISON:  Chest CT dated 03/29/2023. Radiograph dated 06/29/2023. FINDINGS: Cardiovascular: Mild cardiomegaly. No pericardial effusion. There is coronary vascular calcification. Moderate calcified and noncalcified plaque of the thoracic aorta. No aneurysmal dilatation or dissection. The origins of the great vessels of the aortic arch appear patent. Evaluation of the pulmonary arteries is limited due to respiratory motion. No pulmonary artery embolus identified. Mediastinum/Nodes: No hilar or mediastinal adenopathy. The esophagus is grossly unremarkable. No mediastinal fluid collection. Lungs/Pleura: Small left and moderate right pleural effusions with partial compressive atelectasis of the lower lobes. There is diffuse interstitial and interlobular septal prominence consistent with edema. There is no pneumothorax. The central airways are patent. Upper Abdomen: Indeterminate cystic pancreatic lesions as seen on the prior CT and better evaluated on the CT abdomen pelvis of 08/22/2022. Musculoskeletal: Osteopenia with degenerative changes of the spine. No acute osseous pathology. There is stranding and edema of the chest wall. Review of the MIP images confirms the above findings. IMPRESSION: 1. No CT evidence of pulmonary artery embolus. 2. Mild cardiomegaly with pulmonary edema and bilateral pleural effusions, right greater than left.  3. Bilateral lower lobe atelectasis or pneumonia. 4.  Aortic Atherosclerosis (ICD10-I70.0). Electronically Signed   By: Elgie Collard M.D.   On: 06/29/2023 12:17   DG Hip Unilat W or Wo Pelvis 2-3 Views Left Result Date: 06/29/2023 CLINICAL DATA:  Left hip pain after a fall a few days ago EXAM: DG HIP (WITH OR WITHOUT PELVIS) 2-3V LEFT COMPARISON:  05/06/2023 FINDINGS: AP view of the pelvis and AP/frog leg views of the left hip. Femoral heads are located. Degenerative sclerosis of the left sacroiliac joint. No acute fracture. IMPRESSION: No acute osseous abnormality. Electronically Signed   By: Jeronimo Greaves M.D.   On: 06/29/2023 10:26   DG Chest Port 1 View Result Date: 06/29/2023 CLINICAL DATA:  Short of breath and hypoxic. EXAM: PORTABLE CHEST 1 VIEW COMPARISON:  08/26/2022 FINDINGS: Surgical clips project over the right axilla. Patient is rotated to the right. Cardiomegaly accentuated by AP portable technique. Atherosclerosis in the transverse aorta. Probable small right pleural effusion. No pneumothorax. Progressive interstitial prominence and indistinctness. Similar left and increased right base airspace disease. IMPRESSION: Moderate congestive heart failure. New or increased small right pleural effusion. Persistent left and increased right lower lung airspace disease. Most likely atelectasis. Cannot exclude concurrent pneumonia or aspiration. Electronically Signed   By: Jeronimo Greaves M.D.   On: 06/29/2023 10:23        Sunnie Nielsen, DO [facility] 07/02/2023, 1:13 PM    Dictation software may have been used to generate the above note. Typos may occur and escape review in typed/dictated notes. Please contact Dr Lyn Hollingshead directly for clarity if needed.  Staff may message me via secure chat in Epic  but this may not receive an immediate response,  please page me for  urgent matters!  If 7PM-7AM, please contact night coverage [email or web address]

## 2023-07-03 DIAGNOSIS — I5033 Acute on chronic diastolic (congestive) heart failure: Secondary | ICD-10-CM | POA: Diagnosis not present

## 2023-07-03 LAB — BASIC METABOLIC PANEL
Anion gap: 10 (ref 5–15)
BUN: 28 mg/dL — ABNORMAL HIGH (ref 8–23)
CO2: 25 mmol/L (ref 22–32)
Calcium: 9.5 mg/dL (ref 8.9–10.3)
Chloride: 105 mmol/L (ref 98–111)
Creatinine, Ser: 1.56 mg/dL — ABNORMAL HIGH (ref 0.44–1.00)
GFR, Estimated: 31 mL/min — ABNORMAL LOW (ref >60)
Glucose, Bld: 113 mg/dL — ABNORMAL HIGH (ref 70–99)
Potassium: 3.7 mmol/L (ref 3.5–5.1)
Sodium: 140 mmol/L (ref 135–145)

## 2023-07-03 MED ORDER — ACETAMINOPHEN 325 MG PO TABS: 325.0000 mg | ORAL_TABLET | Freq: Four times a day (QID) | ORAL | Status: DC | PRN

## 2023-07-03 MED ORDER — FUROSEMIDE 40 MG PO TABS: 20.0000 mg | ORAL_TABLET | Freq: Every day | ORAL | Status: DC

## 2023-07-03 NOTE — Progress Notes (Signed)
 Report called to receiving nurse, Aggie Cosier, at [facility]. [phone]

## 2023-07-03 NOTE — Plan of Care (Signed)
  Problem: Education: Goal: Knowledge of General Education information will improve Description: Including pain rating scale, medication(s)/side effects and non-pharmacologic comfort measures Outcome: Adequate for Discharge   Problem: Health Behavior/Discharge Planning: Goal: Ability to manage health-related needs will improve Outcome: Adequate for Discharge   Problem: Clinical Measurements: Goal: Ability to maintain clinical measurements within normal limits will improve Outcome: Adequate for Discharge Goal: Will remain free from infection Outcome: Adequate for Discharge Goal: Diagnostic test results will improve Outcome: Adequate for Discharge Goal: Respiratory complications will improve Outcome: Adequate for Discharge Goal: Cardiovascular complication will be avoided Outcome: Adequate for Discharge   Problem: Coping: Goal: Level of anxiety will decrease Outcome: Adequate for Discharge   Problem: Elimination: Goal: Will not experience complications related to bowel motility Outcome: Adequate for Discharge Goal: Will not experience complications related to urinary retention Outcome: Adequate for Discharge   Problem: Nutrition: Goal: Adequate nutrition will be maintained Outcome: Adequate for Discharge   Problem: Activity: Goal: Risk for activity intolerance will decrease Outcome: Adequate for Discharge   Problem: Safety: Goal: Ability to remain free from injury will improve Outcome: Adequate for Discharge   Problem: Skin Integrity: Goal: Risk for impaired skin integrity will decrease Outcome: Adequate for Discharge   Problem: Education: Goal: Ability to demonstrate management of disease process will improve Outcome: Adequate for Discharge Goal: Ability to verbalize understanding of medication therapies will improve Outcome: Adequate for Discharge Goal: Individualized Educational Video(s) Outcome: Adequate for Discharge

## 2023-07-03 NOTE — Discharge Summary (Signed)
 Physician Discharge Summary   Patient: Tina Curtis MRN: [id number]  [name]: 1927/03/08   Admit:     Date of Admission: 06/29/2023 Admitted from: SNF   Discharge: Date of discharge: 07/03/23 Disposition: Skilled nursing facility Condition at discharge: good  CODE STATUS: DNR       Discharge Physician: Sunnie Nielsen, DO [facility]     PCP: [name], MD  Recommendations for Outpatient Follow-up:  Follow up with PCP [name], MD in 1-2 weeks: monitor BP, BMP, fluid status Follow as scheduled w/ cardiology Follow as scheduled w/ podiatry  May need f/u w/ ortho  Please obtain labs/tests: BMP in 2-3 days Daily weights  O2 to wean off as able, ok to use w/ ambulation/at bedtime    Discharge Instructions     (HEART FAILURE PATIENTS) Call MD:  Anytime you have any of the following symptoms: 1) 3 pound weight gain in 24 hours or 5 pounds in 1 week 2) shortness of breath, with or without a dry hacking cough 3) swelling in the hands, feet or stomach 4) if you have to sleep on extra pillows at night in order to breathe.   Complete by: As directed    Diet - low sodium heart healthy   Complete by: As directed    Discharge instructions   Complete by: As directed    Oxygen max 4L/min. Wean off O2 as tolerated BMP lab in 2-3 days  Adjust lasix/furosemide as needed per instructions / per provider covering the facility  Will need primary care follow up in 1-2 weeks  Will need cardiology follow up (keep scheduled appt) Will need orthopedic follow up for L hip/leg pain if not improving w/ PT/OT   Discharge wound care:   Complete by: As directed    Pressure Injury 06/29/23 Toe (Comment  which one) Anterior;Left Stage 3 -  Full thickness tissue loss. Subcutaneous fat may be visible but bone, tendon or muscle are NOT exposed. Will need podiatry follow up   Increase activity slowly   Complete by: As directed          Discharge Diagnoses: Principal  Problem:   Acute exacerbation of CHF (congestive heart failure) (HCC) Active Problems:   Depression   Type II diabetes mellitus with renal manifestations (HCC)   Pure hypercholesterolemia   RLS (restless legs syndrome)   Renovascular hypertension   Anemia in chronic kidney disease   Benign hypertensive kidney disease with chronic kidney disease   History of breast cancer   Left hip pain   Dysuria   CHF exacerbation (HCC)   Pressure injury of skin       Hospital Course: Hospital course / significant events:   HPI: [name] is a 89 year old female with history of hypertension, depression, anxiety, history of NSTEMI, diastolic heart failure, metastatic bone cancer, history of stroke on dual antiplatelet therapy, who presents to the emergency department from [facility] via EMS on 06/29/23 for chief concerns of shortness of breath and fall. Patient reports that she slid out of her chair and landed on her left hip on Saturday, 06/26/2023. She endorses swelling of both her legs. She endorses dysuria and loose stool.  Patient reports she has been having burning sensation and increased urination for several days. She has had a lot of stress lately because her youngest daughter, who is 39 has had a kidney transplant and was recently admitted to hospital and currently is on a ventilator in [place].  02/18: BNP 680. Requiring O2. Admitted to hospitalist service for HFpEF exacerbation. Diuresing.  02/19: UOP appears under-documented but pt reports peeing a lot. Reports breathing better today.  02/20: Cr up to 1.7, still on 4L O2, holding diuresis for now given renal fxn and will reassess tomorrow.  02/21: Cr 1.5. still needing 4L O2, dosed another lasix IV. Trial wen to 2-3L O2 later today 02/22: down to 1-1.5L O2 and stable, can continue on catious diuresis outpatient - titrate up as needed / follow w/ cardiology      Consultants:   none  Procedures/Surgeries: none      ASSESSMENT & PLAN:   Acute exacerbation of HFpEF Furosemide 40 mg IV twice daily, 2 doses ordered for 06/30/2023 and held 2/20 d/t Cr up a bit, single IV dose 07/02/23  Patient had echo done on 05/07/2023 which was read as estimated ejection fraction is 60 to 65%, grade 1 diastolic dysfunction - will not repeat echo now given recent results  Strict I's and O's   CKD4 Question cardiorenal syndrome Following w/ nephrology outpatient Closely monitor BMP w/ diuresis  Renovascular hypertension Home amlodipine 10 mg nightly, irbesartan 150 mg daily, metoprolol succinate 25 mg daily were resumed on admission Scheduled home hydralazine 50 mg 3 times daily with instructions to skip if SBP is less than 130 has not been resumed on admission Hydralazine 10 mg p.o. every 6 hours as needed for SBP greater 165, 4 days ordered on admission  Depression Home citalopram 10 mg daily, duloxetine 60 mg daily resumed   Dysuria, polyuria, urinary urgency Nitrofurantoin Outpatient follow-up, UA ok may need to treat for atrophic vaginitis   History of breast cancer Home anastrozole 1 mg daily resumed   Anemia in chronic kidney disease At baseline Monitor CBC, BMP    RLS (restless legs syndrome) Home gabapentin 300 mg p.o. twice daily resumed   Chronic low back pain Pt upset she wasn't getting pain meds, feels not checked in on frequently but she is also not using call bell "I dont want to complain"  scheduled pain rx    overweight based on BMI: Body mass index is 25.97 kg/m.  Underweight - under 18  overweight - 25 to 29 obese - 30 or more Class 1 obesity: BMI of 30.0 to 34 Class 2 obesity: BMI of 35.0 to 39 Class 3 obesity: BMI of 40.0 to 49 Super Morbid Obesity: BMI 50-59 Super-super Morbid Obesity: BMI 60+ Significantly low or high BMI is associated with higher medical risk.  Weight management advised as adjunct to other disease management  and risk reduction treatments             Discharge Instructions  Allergies as of 07/03/2023       Reactions   Penicillins Anaphylaxis   Patient tolerates ceftriaxone   Drug Ingredient [zinc]    Empagliflozin Rash        Medication List     TAKE these medications    acetaminophen 325 MG tablet Commonly known as: TYLENOL Take 1-2 tablets (325-650 mg total) by mouth every 6 (six) hours as needed for mild pain (pain score 1-3), moderate pain (pain score 4-6), fever or headache. What changed:  how much to take when to take this reasons to take this Another medication with the same name was removed. Continue taking this medication, and follow the directions you see here.   allopurinol 100 MG tablet Commonly known as: ZYLOPRIM Take 100 mg by mouth daily.   amLODipine  10 MG tablet Commonly known as: NORVASC Take 10 mg by mouth at bedtime.   anastrozole 1 MG tablet Commonly known as: ARIMIDEX Take 1 tablet (1 mg total) by mouth daily.   Benefiber On The GO Pack Take 1 packet by mouth daily as needed.   calcium carbonate 1250 (500 Ca) MG tablet Commonly known as: OS-CAL - dosed in mg of elemental calcium Take 2 tablets by mouth daily with breakfast.   citalopram 10 MG tablet Commonly known as: CELEXA Take 10 mg by mouth daily.   cyanocobalamin 1000 MCG tablet Commonly known as: VITAMIN B12 Take 1,000 mcg by mouth daily.   docusate sodium 100 MG capsule Commonly known as: COLACE Take 100 mg by mouth daily as needed for mild constipation.   DULoxetine 60 MG capsule Commonly known as: CYMBALTA Take 60 mg by mouth daily.   furosemide 40 MG tablet Commonly known as: Lasix Take 0.5 tablets (20 mg total) by mouth daily. Increase to 1 tablet (40 mg total) by mouth daily (total daily dose 40 mg) as needed for up to 3 days for increased leg swelling, shortness of breath, weight gain 5+ lbs over 1-2 days. Seek medical advice if these symptoms are not improving  with increased dose.   gabapentin 300 MG capsule Commonly known as: NEURONTIN Take 300 mg by mouth 2 (two) times daily.   GAS-X PO Take 1 capsule by mouth every 8 (eight) hours as needed (for gas).   guaifenesin 100 MG/5ML syrup Commonly known as: ROBITUSSIN Take 200 mg by mouth every 4 (four) hours as needed for cough.   hydrALAZINE 50 MG tablet Commonly known as: APRESOLINE Take 1 tablet (50 mg total) by mouth 3 (three) times daily. Skip the dose if SBP less than 130 mmHg   loratadine 10 MG tablet Commonly known as: CLARITIN Take 10 mg by mouth daily as needed for allergies. AM   melatonin 3 MG Tabs tablet Take 3 mg by mouth at bedtime.   metoprolol succinate 25 MG 24 hr tablet Commonly known as: TOPROL-XL Take 1 tablet (25 mg total) by mouth daily.   polyethylene glycol 17 g packet Commonly known as: MIRALAX / GLYCOLAX Take 17 g by mouth daily. Skip the dose if no constipation What changed: additional instructions   Santyl 250 UNIT/GM ointment Generic drug: collagenase Apply 1 Application topically daily. apply to left medial cuneiform every day   senna 8.6 MG Tabs tablet Commonly known as: SENOKOT Take 1 tablet by mouth every other day.   simvastatin 20 MG tablet Commonly known as: ZOCOR Take 20 mg by mouth every evening. PM   sodium bicarbonate 650 MG tablet Take 650 mg by mouth every 8 (eight) hours as needed for heartburn.   valsartan 160 MG tablet Commonly known as: DIOVAN Take 160 mg by mouth daily.   Vitamin D (Ergocalciferol) 1.25 MG (50000 UNIT) Caps capsule Commonly known as: DRISDOL Take 50,000 Units by mouth every 7 (seven) days.   Voltaren 1 % Gel Generic drug: diclofenac Sodium Apply 2 g topically at bedtime. To bilateral legs.   Zinc Oxide 12.8 % ointment Commonly known as: TRIPLE PASTE Apply 1 Application topically. To buttocks every shift.               Discharge Care Instructions  (From admission, onward)            Start     Ordered   07/03/23 0000  Discharge wound care:  Comments: Pressure Injury 06/29/23 Toe (Comment  which one) Anterior;Left Stage 3 -  Full thickness tissue loss. Subcutaneous fat may be visible but bone, tendon or muscle are NOT exposed. Will need podiatry follow up   07/03/23 1037             Contact information for follow-up providers     Alluri, Meryl Dare, MD. Go in 4 week(s).   Specialty: Cardiology Why: Appt with [facility] Heart Failure clinic 07/06/23 at 2:00 PM Appt with Dr. Corky Sing 08/04/23 at 2:15 PM Contact information: [address] [place] [address] [phone]              Contact information for after-discharge care     Destination     [facility] .   Service: Skilled Nursing Contact information: [address] [place] [address] [phone]                     Allergies  Allergen Reactions   Penicillins Anaphylaxis    Patient tolerates ceftriaxone   Drug Ingredient [Zinc]    Empagliflozin Rash     Subjective: pt feeling better today, overall tired, no SOB today, concerned about sore on her foot (has been stable), concerned about L hip pain (has been stable, XR neg). Tolerating diet    Discharge Exam: BP (!) 150/68 (BP Location: Left Arm)   Pulse 86   Temp 98.3 F (36.8 C) (Oral)   Resp 18   Ht 5\' 2"  (1.575 m)   Wt 67.8 kg   SpO2 (P) 95%   BMI 27.34 kg/m  General: Pt is alert, awake, not in acute distress Cardiovascular: RRR, S1/S2 +murmur Respiratory: Faint rales bilateral bases  Abdominal: Soft, NT, ND, bowel sounds + Extremities: no edema, no cyanosis     The results of significant diagnostics from this hospitalization (including imaging, microbiology, ancillary and laboratory) are listed below for reference.     Microbiology: Recent Results (from the past 240 hours)  Blood Culture (routine x 2)     Status: None (Preliminary result)   Collection Time:  06/29/23  8:23 AM   Specimen: BLOOD  Result Value Ref Range Status   Specimen Description BLOOD LEFT ANTECUBITAL  Final   Special Requests   Final    BOTTLES DRAWN AEROBIC AND ANAEROBIC Blood Culture adequate volume   Culture   Final    NO GROWTH 4 DAYS Performed at [facility], [address] [place], [place] [address]    Report Status PENDING  Incomplete  Blood Culture (routine x 2)     Status: None (Preliminary result)   Collection Time: 06/29/23  8:28 AM   Specimen: BLOOD  Result Value Ref Range Status   Specimen Description BLOOD BLOOD LEFT FOREARM  Final   Special Requests   Final    BOTTLES DRAWN AEROBIC AND ANAEROBIC Blood Culture results may not be optimal due to an inadequate volume of blood received in culture bottles   Culture   Final    NO GROWTH 4 DAYS Performed at [facility], [address] [place], [place] [address]    Report Status PENDING  Incomplete  Resp panel by RT-PCR (RSV, Flu A&B, Covid) Anterior Nasal Swab     Status: None   Collection Time: 06/29/23  8:54 AM   Specimen: Anterior Nasal Swab  Result Value Ref Range Status   SARS Coronavirus 2 by RT PCR NEGATIVE NEGATIVE Final  Comment: (NOTE) SARS-CoV-2 target nucleic acids are NOT DETECTED.  The SARS-CoV-2 RNA is generally detectable in upper respiratory specimens during the acute phase of infection. The lowest concentration of SARS-CoV-2 viral copies this assay can detect is 138 copies/mL. A negative result does not preclude SARS-Cov-2 infection and should not be used as the sole basis for treatment or other patient management decisions. A negative result may occur with  improper specimen collection/handling, submission of specimen other than nasopharyngeal swab, presence of viral mutation(s) within the areas targeted by this assay, and inadequate number of viral copies(<138 copies/mL). A negative result must be combined with clinical observations, patient history, and  epidemiological information. The expected result is Negative.  Fact Sheet for Patients:  BloggerCourse.com  Fact Sheet for Healthcare Providers:  SeriousBroker.it  This test is no t yet approved or cleared by the [place] FDA and  has been authorized for detection and/or diagnosis of SARS-CoV-2 by FDA under an Emergency Use Authorization (EUA). This EUA will remain  in effect (meaning this test can be used) for the duration of the COVID-19 declaration under Section 564(b)(1) of the Act, 21 U.S.C.section 360bbb-3(b)(1), unless the authorization is terminated  or revoked sooner.       Influenza A by PCR NEGATIVE NEGATIVE Final   Influenza B by PCR NEGATIVE NEGATIVE Final    Comment: (NOTE) The Xpert Xpress SARS-CoV-2/FLU/RSV plus assay is intended as an aid in the diagnosis of influenza from Nasopharyngeal swab specimens and should not be used as a sole basis for treatment. Nasal washings and aspirates are unacceptable for Xpert Xpress SARS-CoV-2/FLU/RSV testing.  Fact Sheet for Patients: BloggerCourse.com  Fact Sheet for Healthcare Providers: SeriousBroker.it  This test is not yet approved or cleared by the [place] FDA and has been authorized for detection and/or diagnosis of SARS-CoV-2 by FDA under an Emergency Use Authorization (EUA). This EUA will remain in effect (meaning this test can be used) for the duration of the COVID-19 declaration under Section 564(b)(1) of the Act, 21 U.S.C. section 360bbb-3(b)(1), unless the authorization is terminated or revoked.     Resp Syncytial Virus by PCR NEGATIVE NEGATIVE Final    Comment: (NOTE) Fact Sheet for Patients: BloggerCourse.com  Fact Sheet for Healthcare Providers: SeriousBroker.it  This test is not yet approved or cleared by the [place] FDA and has been  authorized for detection and/or diagnosis of SARS-CoV-2 by FDA under an Emergency Use Authorization (EUA). This EUA will remain in effect (meaning this test can be used) for the duration of the COVID-19 declaration under Section 564(b)(1) of the Act, 21 U.S.C. section 360bbb-3(b)(1), unless the authorization is terminated or revoked.  Performed at [facility], [address] [place], [place] [address]      Labs: BNP (last 3 results) Recent Labs    08/26/22 0526 08/27/22 0742 06/29/23 0823  BNP 621.0* 1,712.6* 680.0*   Basic Metabolic Panel: Recent Labs  Lab 06/29/23 0823 06/30/23 0452 07/01/23 0510 07/02/23 0504 07/03/23 0454  NA 139 139 141 139 140  K 4.0 3.6 3.5 3.6 3.7  CL 107 107 105 105 105  CO2 22 22 25 25 25   GLUCOSE 162* 125* 141* 115* 113*  BUN 27* 25* 27* 27* 28*  CREATININE 1.54* 1.69* 1.74* 1.58* 1.56*  CALCIUM 8.9 8.9 9.0 9.1 9.5   Liver Function Tests: Recent Labs  Lab 06/29/23 0823  AST 21  ALT 11  ALKPHOS 61  BILITOT 0.7  PROT 7.0  ALBUMIN 3.5   No  results for input(s): "LIPASE", "AMYLASE" in the last 168 hours. No results for input(s): "AMMONIA" in the last 168 hours. CBC: Recent Labs  Lab 06/29/23 0823 06/30/23 0452 07/01/23 0510  WBC 10.4 7.8 7.1  NEUTROABS 6.9  --   --   HGB 10.0* 9.4* 9.6*  HCT 29.8* 28.2* 28.4*  MCV 92.3 91.3 91.0  PLT 234 235 233   Cardiac Enzymes: No results for input(s): "CKTOTAL", "CKMB", "CKMBINDEX", "TROPONINI" in the last 168 hours. BNP: Invalid input(s): "POCBNP" CBG: No results for input(s): "GLUCAP" in the last 168 hours. D-Dimer No results for input(s): "DDIMER" in the last 72 hours. Hgb A1c No results for input(s): "HGBA1C" in the last 72 hours. Lipid Profile No results for input(s): "CHOL", "HDL", "LDLCALC", "TRIG", "CHOLHDL", "LDLDIRECT" in the last 72 hours. Thyroid function studies No results for input(s): "TSH", "T4TOTAL", "T3FREE", "THYROIDAB" in the last 72  hours.  Invalid input(s): "FREET3" Anemia work up No results for input(s): "VITAMINB12", "FOLATE", "FERRITIN", "TIBC", "IRON", "RETICCTPCT" in the last 72 hours. Urinalysis    Component Value Date/Time   COLORURINE STRAW (A) 06/29/2023 1231   APPEARANCEUR CLEAR (A) 06/29/2023 1231   LABSPEC 1.013 06/29/2023 1231   PHURINE 8.0 06/29/2023 1231   GLUCOSEU NEGATIVE 06/29/2023 1231   HGBUR NEGATIVE 06/29/2023 1231   BILIRUBINUR NEGATIVE 06/29/2023 1231   BILIRUBINUR negative 03/18/2022 1614   KETONESUR NEGATIVE 06/29/2023 1231   PROTEINUR NEGATIVE 06/29/2023 1231   UROBILINOGEN 0.2 03/18/2022 1614   NITRITE NEGATIVE 06/29/2023 1231   LEUKOCYTESUR NEGATIVE 06/29/2023 1231   Sepsis Labs Recent Labs  Lab 06/29/23 0823 06/30/23 0452 07/01/23 0510  WBC 10.4 7.8 7.1   Microbiology Recent Results (from the past 240 hours)  Blood Culture (routine x 2)     Status: None (Preliminary result)   Collection Time: 06/29/23  8:23 AM   Specimen: BLOOD  Result Value Ref Range Status   Specimen Description BLOOD LEFT ANTECUBITAL  Final   Special Requests   Final    BOTTLES DRAWN AEROBIC AND ANAEROBIC Blood Culture adequate volume   Culture   Final    NO GROWTH 4 DAYS Performed at [facility], [address] [place], [place] [address]    Report Status PENDING  Incomplete  Blood Culture (routine x 2)     Status: None (Preliminary result)   Collection Time: 06/29/23  8:28 AM   Specimen: BLOOD  Result Value Ref Range Status   Specimen Description BLOOD BLOOD LEFT FOREARM  Final   Special Requests   Final    BOTTLES DRAWN AEROBIC AND ANAEROBIC Blood Culture results may not be optimal due to an inadequate volume of blood received in culture bottles   Culture   Final    NO GROWTH 4 DAYS Performed at [facility], [address], [place], [place] [address]    Report Status PENDING  Incomplete  Resp panel by RT-PCR (RSV, Flu A&B, Covid) Anterior Nasal Swab     Status:  None   Collection Time: 06/29/23  8:54 AM   Specimen: Anterior Nasal Swab  Result Value Ref Range Status   SARS Coronavirus 2 by RT PCR NEGATIVE NEGATIVE Final    Comment: (NOTE) SARS-CoV-2 target nucleic acids are NOT DETECTED.  The SARS-CoV-2 RNA is generally detectable in upper respiratory specimens during the acute phase of infection. The lowest concentration of SARS-CoV-2 viral copies this assay can detect is 138 copies/mL. A negative result does not preclude SARS-Cov-2 infection and should not be used as  the sole basis for treatment or other patient management decisions. A negative result may occur with  improper specimen collection/handling, submission of specimen other than nasopharyngeal swab, presence of viral mutation(s) within the areas targeted by this assay, and inadequate number of viral copies(<138 copies/mL). A negative result must be combined with clinical observations, patient history, and epidemiological information. The expected result is Negative.  Fact Sheet for Patients:  BloggerCourse.com  Fact Sheet for Healthcare Providers:  SeriousBroker.it  This test is no t yet approved or cleared by the [place] FDA and  has been authorized for detection and/or diagnosis of SARS-CoV-2 by FDA under an Emergency Use Authorization (EUA). This EUA will remain  in effect (meaning this test can be used) for the duration of the COVID-19 declaration under Section 564(b)(1) of the Act, 21 U.S.C.section 360bbb-3(b)(1), unless the authorization is terminated  or revoked sooner.       Influenza A by PCR NEGATIVE NEGATIVE Final   Influenza B by PCR NEGATIVE NEGATIVE Final    Comment: (NOTE) The Xpert Xpress SARS-CoV-2/FLU/RSV plus assay is intended as an aid in the diagnosis of influenza from Nasopharyngeal swab specimens and should not be used as a sole basis for treatment. Nasal washings and aspirates are unacceptable  for Xpert Xpress SARS-CoV-2/FLU/RSV testing.  Fact Sheet for Patients: BloggerCourse.com  Fact Sheet for Healthcare Providers: SeriousBroker.it  This test is not yet approved or cleared by the [place] FDA and has been authorized for detection and/or diagnosis of SARS-CoV-2 by FDA under an Emergency Use Authorization (EUA). This EUA will remain in effect (meaning this test can be used) for the duration of the COVID-19 declaration under Section 564(b)(1) of the Act, 21 U.S.C. section 360bbb-3(b)(1), unless the authorization is terminated or revoked.     Resp Syncytial Virus by PCR NEGATIVE NEGATIVE Final    Comment: (NOTE) Fact Sheet for Patients: BloggerCourse.com  Fact Sheet for Healthcare Providers: SeriousBroker.it  This test is not yet approved or cleared by the [place] FDA and has been authorized for detection and/or diagnosis of SARS-CoV-2 by FDA under an Emergency Use Authorization (EUA). This EUA will remain in effect (meaning this test can be used) for the duration of the COVID-19 declaration under Section 564(b)(1) of the Act, 21 U.S.C. section 360bbb-3(b)(1), unless the authorization is terminated or revoked.  Performed at [facility], [address], [place], [place] [address]    Imaging CT Angio Chest PE W and/or Wo Contrast Result Date: 06/29/2023 CLINICAL DATA:  Shortness of breath and hypoxia. Concern for pulmonary embolism. Recent fall. EXAM: CT ANGIOGRAPHY CHEST WITH CONTRAST TECHNIQUE: Multidetector CT imaging of the chest was performed using the standard protocol during bolus administration of intravenous contrast. Multiplanar CT image reconstructions and MIPs were obtained to evaluate the vascular anatomy. RADIATION DOSE REDUCTION: This exam was performed according to the departmental dose-optimization program which includes automated  exposure control, adjustment of the mA and/or kV according to patient size and/or use of iterative reconstruction technique. CONTRAST:  50mL OMNIPAQUE IOHEXOL 350 MG/ML SOLN COMPARISON:  Chest CT dated 03/29/2023. Radiograph dated 06/29/2023. FINDINGS: Cardiovascular: Mild cardiomegaly. No pericardial effusion. There is coronary vascular calcification. Moderate calcified and noncalcified plaque of the thoracic aorta. No aneurysmal dilatation or dissection. The origins of the great vessels of the aortic arch appear patent. Evaluation of the pulmonary arteries is limited due to respiratory motion. No pulmonary artery embolus identified. Mediastinum/Nodes: No hilar or mediastinal adenopathy. The esophagus is grossly unremarkable. No mediastinal fluid collection.  Lungs/Pleura: Small left and moderate right pleural effusions with partial compressive atelectasis of the lower lobes. There is diffuse interstitial and interlobular septal prominence consistent with edema. There is no pneumothorax. The central airways are patent. Upper Abdomen: Indeterminate cystic pancreatic lesions as seen on the prior CT and better evaluated on the CT abdomen pelvis of 08/22/2022. Musculoskeletal: Osteopenia with degenerative changes of the spine. No acute osseous pathology. There is stranding and edema of the chest wall. Review of the MIP images confirms the above findings. IMPRESSION: 1. No CT evidence of pulmonary artery embolus. 2. Mild cardiomegaly with pulmonary edema and bilateral pleural effusions, right greater than left. 3. Bilateral lower lobe atelectasis or pneumonia. 4.  Aortic Atherosclerosis (ICD10-I70.0). Electronically Signed   By: Elgie Collard M.D.   On: 06/29/2023 12:17   DG Hip Unilat W or Wo Pelvis 2-3 Views Left Result Date: 06/29/2023 CLINICAL DATA:  Left hip pain after a fall a few days ago EXAM: DG HIP (WITH OR WITHOUT PELVIS) 2-3V LEFT COMPARISON:  05/06/2023 FINDINGS: AP view of the pelvis and AP/frog leg  views of the left hip. Femoral heads are located. Degenerative sclerosis of the left sacroiliac joint. No acute fracture. IMPRESSION: No acute osseous abnormality. Electronically Signed   By: Jeronimo Greaves M.D.   On: 06/29/2023 10:26   DG Chest Port 1 View Result Date: 06/29/2023 CLINICAL DATA:  Short of breath and hypoxic. EXAM: PORTABLE CHEST 1 VIEW COMPARISON:  08/26/2022 FINDINGS: Surgical clips project over the right axilla. Patient is rotated to the right. Cardiomegaly accentuated by AP portable technique. Atherosclerosis in the transverse aorta. Probable small right pleural effusion. No pneumothorax. Progressive interstitial prominence and indistinctness. Similar left and increased right base airspace disease. IMPRESSION: Moderate congestive heart failure. New or increased small right pleural effusion. Persistent left and increased right lower lung airspace disease. Most likely atelectasis. Cannot exclude concurrent pneumonia or aspiration. Electronically Signed   By: Jeronimo Greaves M.D.   On: 06/29/2023 10:23      Time coordinating discharge: over 30 minutes  SIGNED:  Sunnie Nielsen DO [facility]

## 2023-07-03 NOTE — Progress Notes (Signed)
 Attempted to call report x 4 to [phone] Unable to get through. Line is busy every time.

## 2023-07-03 NOTE — TOC Transition Note (Addendum)
 Transition of Care [facility]) - Discharge Note   Patient Details  Name: Tina Curtis MRN: [id number] Date of Birth: 02-19-1927  [facility]) CM/SW Contact:  Rodney Langton, RN Phone Number: 07/03/2023, 11:12 AM   Clinical Narrative:     Patient with discharge orders to go back to [facility].  Orders and summary sent to Sue Lush at facility, patient will go to room 505, bedside nurse to call report.  Medical Necessity form and facesheet completed and printed, nurse advised to confirm DNR form is signed and in packet.  Daughter [name] called and advised of discharge plan.  Will call [facility] for transport once report has been called to facility.   Update @ 1125:  Report called by bedside nurse, transportation called for discharge. No further TOC needs.    Final next level of care: Skilled Nursing Facility Barriers to Discharge: Barriers Resolved   Patient Goals and CMS Choice Patient states their goals for this hospitalization and ongoing recovery are:: SNF   Choice offered to / list presented to : Patient      Discharge Placement              Patient chooses bed at: [facility] Patient to be transferred to facility by: [facility] Name of family member notified: [name], daughter Patient and family notified of of transfer: 07/03/23  Discharge Plan and Services Additional resources added to the After Visit Summary for       Post Acute Care Choice: Resumption of Svcs/PTA Provider                               Social Drivers of Health (SDOH) Interventions SDOH Screenings   Food Insecurity: No Food Insecurity (06/30/2023)  Housing: Low Risk  (06/30/2023)  Transportation Needs: No Transportation Needs (06/30/2023)  Utilities: Not At Risk (06/30/2023)  Financial Resource Strain: Low Risk  (11/17/2022)   [facility], [facility]  Social Connections: Unknown (06/30/2023)  Tobacco Use: Low Risk  (06/29/2023)      Readmission Risk Interventions    05/07/2023   12:51 PM 06/08/2022    3:30 PM  Readmission Risk Prevention Plan  Transportation Screening Complete Complete  PCP or Specialist Appt within 3-5 Days Complete   HRI or Home Care Consult Complete   Social Work Consult for Recovery Care Planning/Counseling Complete   Palliative Care Screening Complete Not Applicable  Medication Review Oceanographer) Complete Complete

## 2023-07-04 LAB — CULTURE, BLOOD (ROUTINE X 2)
Culture: NO GROWTH
Culture: NO GROWTH
Special Requests: ADEQUATE

## 2023-07-05 ENCOUNTER — Non-Acute Institutional Stay (SKILLED_NURSING_FACILITY): Payer: Self-pay | Admitting: Adult Health

## 2023-07-05 ENCOUNTER — Encounter: Payer: Self-pay | Admitting: Adult Health

## 2023-07-05 DIAGNOSIS — Z170 Estrogen receptor positive status [ER+]: Secondary | ICD-10-CM

## 2023-07-05 DIAGNOSIS — F339 Major depressive disorder, recurrent, unspecified: Secondary | ICD-10-CM | POA: Diagnosis not present

## 2023-07-05 DIAGNOSIS — I5031 Acute diastolic (congestive) heart failure: Secondary | ICD-10-CM | POA: Diagnosis not present

## 2023-07-05 DIAGNOSIS — N184 Chronic kidney disease, stage 4 (severe): Secondary | ICD-10-CM

## 2023-07-05 DIAGNOSIS — G8929 Other chronic pain: Secondary | ICD-10-CM

## 2023-07-05 DIAGNOSIS — I150 Renovascular hypertension: Secondary | ICD-10-CM | POA: Diagnosis not present

## 2023-07-05 DIAGNOSIS — C50211 Malignant neoplasm of upper-inner quadrant of right female breast: Secondary | ICD-10-CM

## 2023-07-05 DIAGNOSIS — G2581 Restless legs syndrome: Secondary | ICD-10-CM

## 2023-07-05 LAB — BASIC METABOLIC PANEL
BUN: 24 — AB (ref 4–21)
CO2: 29 — AB (ref 13–22)
Chloride: 105 (ref 99–108)
Creatinine: 1.6 — AB (ref 0.5–1.1)
Glucose: 131
Potassium: 4.1 meq/L (ref 3.5–5.1)
Sodium: 140 (ref 137–147)

## 2023-07-05 LAB — COMPREHENSIVE METABOLIC PANEL
Calcium: 9.7 (ref 8.7–10.7)
eGFR: 29

## 2023-07-05 NOTE — Progress Notes (Unsigned)
 Location:  Other ([facility]) Nursing Home Room Number: 505 A Place of Service:  SNF ([phone]) Provider:  Kenard Gower, DNP, FNP-BC  Patient Care Team: [name], MD as PCP - General (Family Medicine) Earna Coder, MD as Consulting Physician (Internal Medicine) Lemar Livings Merrily Pew, MD as Consulting Physician (General Surgery) Scarlett Presto, RN (Inactive) as Oncology Nurse Navigator Carmina Miller, MD as Referring Physician (Radiation Oncology)  Extended Emergency Contact Information Primary Emergency Contact: [facility] Phone: [phone] Relation: Daughter Secondary Emergency Contact: Hicklin,Roxanne Mobile Phone: [phone] Relation: Niece  Code Status:   DNR  Goals of care: Advanced Directive information    06/30/2023   12:17 AM  Advanced Directives  Does Patient Have a Medical Advance Directive? No  Would patient like information on creating a medical advance directive? No - Patient declined     Chief Complaint  Patient presents with   Acute Visit    Follow up hospitalization 06/29/23 to 07/03/23    HPI:  Pt is a 89 y.o. female seen today to follow up hospitalization 2/18 to 07/03/23.   Past Medical History:  Diagnosis Date   Arthritis    Gout   Chronic kidney disease    Diarrhea    Hypercholesteremia    Hypertension    Neuropathy    feet and lower legs   Seasonal allergies    Skin cancer    face   Past Surgical History:  Procedure Laterality Date   ABDOMINAL HYSTERECTOMY  2000   APPENDECTOMY     BREAST BIOPSY Right 09/19/2019   affirm bx of calcs UOQ, x marker, path pending   BREAST BIOPSY Right 09/19/2019   [place] bx of mass,heart marker, path pending   BREAST BIOPSY Right 09/19/2019   [place] bx of LN, coil marker, path pending   CATARACT EXTRACTION Left    CATARACT EXTRACTION W/PHACO Right 10/24/2014   Procedure: CATARACT EXTRACTION PHACO AND INTRAOCULAR LENS PLACEMENT (IOC);  Surgeon: Lockie Mola,  MD;  Location[facility];  Service: Ophthalmology;  Laterality: Right;   ESOPHAGOGASTRODUODENOSCOPY N/A 03/07/2018   Procedure: ESOPHAGOGASTRODUODENOSCOPY (EGD);  Surgeon: Toney Reil, MD;  Location: [facility] ENDOSCOPY;  Service: Gastroenterology;  Laterality: N/A;   MASTECTOMY MODIFIED RADICAL Right 10/13/2019   Procedure: MASTECTOMY MODIFIED RADICAL;  Surgeon: Earline Mayotte, MD;  Location: [facility] ORS;  Service: General;  Laterality: Right;   RENAL ANGIOGRAPHY Right 04/11/2018   Procedure: RENAL ANGIOGRAPHY;  Surgeon: Annice Needy, MD;  Location: [facility] INVASIVE CV LAB;  Service: Cardiovascular;  Laterality: Right;   SIMPLE MASTECTOMY WITH AXILLARY SENTINEL NODE BIOPSY Left 10/13/2019   Procedure: SIMPLE MASTECTOMY TRUE CUT BIOPSY, SENTINEL NODE BIOPSY;  Surgeon: Earline Mayotte, MD;  Location: [facility] ORS;  Service: General;  Laterality: Left;    Allergies  Allergen Reactions   Penicillins Anaphylaxis    Patient tolerates ceftriaxone   Drug Ingredient [Zinc]    Empagliflozin Rash    Outpatient Encounter Medications as of 07/05/2023  Medication Sig   acetaminophen (TYLENOL) 325 MG tablet Take 1-2 tablets (325-650 mg total) by mouth every 6 (six) hours as needed for mild pain (pain score 1-3), moderate pain (pain score 4-6), fever or headache.   allopurinol (ZYLOPRIM) 100 MG tablet Take 100 mg by mouth daily.   amLODipine (NORVASC) 10 MG tablet Take 10 mg by mouth at bedtime.   anastrozole (ARIMIDEX) 1 MG tablet Take 1 tablet (1 mg total) by mouth daily.   calcium carbonate (OS-CAL - DOSED IN  MG OF ELEMENTAL CALCIUM) 1250 (500 Ca) MG tablet Take 2 tablets by mouth daily with breakfast.   citalopram (CELEXA) 10 MG tablet Take 10 mg by mouth daily.    cyanocobalamin (VITAMIN B12) 1000 MCG tablet Take 1,000 mcg by mouth daily.   diclofenac Sodium (VOLTAREN) 1 % GEL Apply 2 g topically at bedtime. To bilateral legs.   docusate sodium (COLACE) 100 MG capsule Take 100 mg by mouth  daily as needed for mild constipation.   DULoxetine (CYMBALTA) 60 MG capsule Take 60 mg by mouth daily.   furosemide (LASIX) 40 MG tablet Take 0.5 tablets (20 mg total) by mouth daily. Increase to 1 tablet (40 mg total) by mouth daily (total daily dose 40 mg) as needed for up to 3 days for increased leg swelling, shortness of breath, weight gain 5+ lbs over 1-2 days. Seek medical advice if these symptoms are not improving with increased dose.   gabapentin (NEURONTIN) 300 MG capsule Take 300 mg by mouth 2 (two) times daily.   guaifenesin (ROBITUSSIN) 100 MG/5ML syrup Take 200 mg by mouth every 4 (four) hours as needed for cough.   hydrALAZINE (APRESOLINE) 50 MG tablet Take 1 tablet (50 mg total) by mouth 3 (three) times daily. Skip the dose if SBP less than 130 mmHg   loratadine (CLARITIN) 10 MG tablet Take 10 mg by mouth daily as needed for allergies. AM   melatonin 3 MG TABS tablet Take 3 mg by mouth at bedtime.   metoprolol succinate (TOPROL-XL) 25 MG 24 hr tablet Take 1 tablet (25 mg total) by mouth daily.   polyethylene glycol (MIRALAX / GLYCOLAX) 17 g packet Take 17 g by mouth daily. Skip the dose if no constipation (Patient taking differently: Take 17 g by mouth daily. On Monday , Wednesday and Friday's)   SANTYL 250 UNIT/GM ointment Apply 1 Application topically daily. apply to left medial cuneiform every day   senna (SENOKOT) 8.6 MG TABS tablet Take 1 tablet by mouth every other day.   Simethicone (GAS-X PO) Take 1 capsule by mouth every 8 (eight) hours as needed (for gas).   simvastatin (ZOCOR) 20 MG tablet Take 20 mg by mouth every evening. PM   sodium bicarbonate 650 MG tablet Take 650 mg by mouth every 8 (eight) hours as needed for heartburn.   valsartan (DIOVAN) 160 MG tablet Take 160 mg by mouth daily.   Vitamin D, Ergocalciferol, (DRISDOL) 1.25 MG (50000 UNIT) CAPS capsule Take 50,000 Units by mouth every 7 (seven) days.   Wheat Dextrin (BENEFIBER ON THE GO) PACK Take 1 packet by  mouth daily as needed.   Zinc Oxide (TRIPLE PASTE) 12.8 % ointment Apply 1 Application topically. To buttocks every shift. (Patient not taking: Reported on 06/29/2023)   No facility-administered encounter medications on file as of 07/05/2023.    Review of Systems  Constitutional:  Negative for appetite change, chills, fatigue and fever.  HENT:  Negative for congestion, hearing loss, rhinorrhea and sore throat.   Eyes: Negative.   Respiratory:  Negative for cough, shortness of breath and wheezing.   Cardiovascular:  Negative for chest pain, palpitations and leg swelling.  Gastrointestinal:  Negative for abdominal pain, constipation, diarrhea, nausea and vomiting.  Genitourinary:  Negative for dysuria.  Musculoskeletal:  Negative for arthralgias, back pain and myalgias.  Skin:  Negative for color change, rash and wound.  Neurological:  Negative for dizziness, weakness and headaches.  Psychiatric/Behavioral:  Negative for behavioral problems. The patient is not nervous/anxious.    ***  Immunization History  Administered Date(s) Administered   Influenza Inj Mdck Quad Pf 03/26/2016, 02/14/2018, 02/15/2019, 02/25/2021, 03/02/2022   Influenza Split 02/20/2015   Influenza, Seasonal, Injecte, Preservative Fre 04/13/2012, 03/17/2013   Influenza,inj,Quad PF,6+ Mos 03/07/2014, 02/20/2020   Influenza-Unspecified 03/26/2016, 02/04/2017, 02/14/2018, 02/25/2021, 04/12/2023   Moderna Sars-Covid-2 Vaccination 05/24/2019, 06/24/2019, 02/22/2020, 02/26/2022   PNEUMOCOCCAL CONJUGATE-20 04/09/2023   Pneumococcal Conjugate-13 09/01/2013   Pneumococcal Polysaccharide-23 04/13/2012   RSV,unspecified 04/09/2023   Td 08/07/2010   Tdap 08/07/2010   Zoster, Live 09/12/2007   Pertinent  Health Maintenance Due  Topic Date Due   FOOT EXAM  Never done   OPHTHALMOLOGY EXAM  Never done   HEMOGLOBIN A1C  10/11/2023   INFLUENZA VACCINE  Completed   DEXA SCAN  Completed      01/09/2022   10:55 AM 02/18/2022     1:36 PM 03/18/2022    4:19 PM 03/18/2022    4:46 PM 05/13/2022   10:17 AM  Fall Risk  (RETIRED) Patient Fall Risk Level High fall risk High fall risk Moderate fall risk Moderate fall risk High fall risk     Vitals:   07/05/23 1714  BP: 135/72  Pulse: 85  Resp: 18  Temp: 98 F (36.7 C)  SpO2: 97%  Weight: 143 lb (64.9 kg)  Height: 5\' 2"  (1.575 m)   Body mass index is 26.16 kg/m.  Physical Exam Constitutional:      Appearance: Normal appearance.  HENT:     Head: Normocephalic and atraumatic.     Nose: Nose normal.     Mouth/Throat:     Mouth: Mucous membranes are moist.  Eyes:     Conjunctiva/sclera: Conjunctivae normal.  Cardiovascular:     Rate and Rhythm: Normal rate and regular rhythm.  Pulmonary:     Effort: Pulmonary effort is normal.     Breath sounds: Normal breath sounds.  Abdominal:     General: Bowel sounds are normal.     Palpations: Abdomen is soft.  Musculoskeletal:        General: Normal range of motion.     Cervical back: Normal range of motion.  Skin:    General: Skin is warm and dry.  Neurological:     Mental Status: She is alert.  Psychiatric:        Mood and Affect: Mood normal.        Behavior: Behavior normal.        Labs reviewed: Recent Labs    04/01/23 0519 04/12/23 0000 05/08/23 0402 05/09/23 0436 05/10/23 0210 05/11/23 0515 07/01/23 0510 07/02/23 0504 07/03/23 0454  NA 138   < > 136 135 133*   < > 141 139 140  K 4.4   < > 3.7 3.8 4.0   < > 3.5 3.6 3.7  CL 107   < > 108 106 105   < > 105 105 105  CO2 21*   < > 19* 18* 19*   < > 25 25 25   GLUCOSE 156*   < > 136* 119* 137*   < > 141* 115* 113*  BUN 39*   < > 31* 34* 39*   < > 27* 27* 28*  CREATININE 1.58*   < > 1.31* 1.47* 1.56*   < > 1.74* 1.58* 1.56*  CALCIUM 8.5*   < > 8.9 8.8* 8.6*   < > 9.0 9.1 9.5  MG 2.4  --  1.7 2.0  --   --   --   --   --  PHOS 3.0  --  3.3 3.4 2.9  --   --   --   --    < > = values in this interval not displayed.   Recent Labs     02/22/23 1444 05/08/23 0402 05/10/23 0210 05/27/23 0909 06/29/23 0823  AST 24  --   --  23 21  ALT 14  --   --  15 11  ALKPHOS 62  --   --  66 61  BILITOT 0.4  --   --  0.6 0.7  PROT 7.5  --   --  7.1 7.0  ALBUMIN 4.1   < > 3.5 3.7 3.5   < > = values in this interval not displayed.   Recent Labs    05/20/23 0000 05/27/23 0909 06/29/23 0823 06/30/23 0452 07/01/23 0510  WBC 8.7 8.9 10.4 7.8 7.1  NEUTROABS 5,220.00 5.5 6.9  --   --   HGB 10.7* 10.8* 10.0* 9.4* 9.6*  HCT 33* 32.7* 29.8* 28.2* 28.4*  MCV  --  93.4 92.3 91.3 91.0  PLT 249 223 234 235 233   Lab Results  Component Value Date   TSH 2.573 08/26/2020   Lab Results  Component Value Date   HGBA1C 7.3 04/12/2023   Lab Results  Component Value Date   CHOL 75 08/27/2022   HDL 22 (L) 08/27/2022   LDLCALC 34 08/27/2022   TRIG 146 05/08/2023   CHOLHDL 3.4 08/27/2022    Significant Diagnostic Results in last 30 days:  CT Angio Chest PE W and/or Wo Contrast Result Date: 06/29/2023 CLINICAL DATA:  Shortness of breath and hypoxia. Concern for pulmonary embolism. Recent fall. EXAM: CT ANGIOGRAPHY CHEST WITH CONTRAST TECHNIQUE: Multidetector CT imaging of the chest was performed using the standard protocol during bolus administration of intravenous contrast. Multiplanar CT image reconstructions and MIPs were obtained to evaluate the vascular anatomy. RADIATION DOSE REDUCTION: This exam was performed according to the departmental dose-optimization program which includes automated exposure control, adjustment of the mA and/or kV according to patient size and/or use of iterative reconstruction technique. CONTRAST:  50mL OMNIPAQUE IOHEXOL 350 MG/ML SOLN COMPARISON:  Chest CT dated 03/29/2023. Radiograph dated 06/29/2023. FINDINGS: Cardiovascular: Mild cardiomegaly. No pericardial effusion. There is coronary vascular calcification. Moderate calcified and noncalcified plaque of the thoracic aorta. No aneurysmal dilatation or  dissection. The origins of the great vessels of the aortic arch appear patent. Evaluation of the pulmonary arteries is limited due to respiratory motion. No pulmonary artery embolus identified. Mediastinum/Nodes: No hilar or mediastinal adenopathy. The esophagus is grossly unremarkable. No mediastinal fluid collection. Lungs/Pleura: Small left and moderate right pleural effusions with partial compressive atelectasis of the lower lobes. There is diffuse interstitial and interlobular septal prominence consistent with edema. There is no pneumothorax. The central airways are patent. Upper Abdomen: Indeterminate cystic pancreatic lesions as seen on the prior CT and better evaluated on the CT abdomen pelvis of 08/22/2022. Musculoskeletal: Osteopenia with degenerative changes of the spine. No acute osseous pathology. There is stranding and edema of the chest wall. Review of the MIP images confirms the above findings. IMPRESSION: 1. No CT evidence of pulmonary artery embolus. 2. Mild cardiomegaly with pulmonary edema and bilateral pleural effusions, right greater than left. 3. Bilateral lower lobe atelectasis or pneumonia. 4.  Aortic Atherosclerosis (ICD10-I70.0). Electronically Signed   By: Elgie Collard M.D.   On: 06/29/2023 12:17   DG Hip Unilat W or Wo Pelvis 2-3 Views Left Result Date: 06/29/2023 CLINICAL DATA:  Left hip pain after a fall a few days ago EXAM: DG HIP (WITH OR WITHOUT PELVIS) 2-3V LEFT COMPARISON:  05/06/2023 FINDINGS: AP view of the pelvis and AP/frog leg views of the left hip. Femoral heads are located. Degenerative sclerosis of the left sacroiliac joint. No acute fracture. IMPRESSION: No acute osseous abnormality. Electronically Signed   By: Jeronimo Greaves M.D.   On: 06/29/2023 10:26   DG Chest Port 1 View Result Date: 06/29/2023 CLINICAL DATA:  Short of breath and hypoxic. EXAM: PORTABLE CHEST 1 VIEW COMPARISON:  08/26/2022 FINDINGS: Surgical clips project over the right axilla. Patient is  rotated to the right. Cardiomegaly accentuated by AP portable technique. Atherosclerosis in the transverse aorta. Probable small right pleural effusion. No pneumothorax. Progressive interstitial prominence and indistinctness. Similar left and increased right base airspace disease. IMPRESSION: Moderate congestive heart failure. New or increased small right pleural effusion. Persistent left and increased right lower lung airspace disease. Most likely atelectasis. Cannot exclude concurrent pneumonia or aspiration. Electronically Signed   By: Jeronimo Greaves M.D.   On: 06/29/2023 10:23   CT HEAD WO CONTRAST ( ) Result Date: 06/22/2023 CLINICAL DATA:  Follow-up subdural hematoma. EXAM: CT HEAD WITHOUT CONTRAST TECHNIQUE: Contiguous axial images were obtained from the base of the skull through the vertex without intravenous contrast. RADIATION DOSE REDUCTION: This exam was performed according to the departmental dose-optimization program which includes automated exposure control, adjustment of the mA and/or kV according to patient size and/or use of iterative reconstruction technique. COMPARISON:  Head CT 05/06/2023 FINDINGS: Brain: A subdural hematoma over the right cerebral convexity is similar in size to the prior CT, measuring up to 2.6 cm in thickness. The collection has overall decreased in density compared to the prior CT with interval development of multiple thick internal septations. Leftward midline shift measures 5 mm, minimally decreased. A 2 mm mildly hyperdense focus along the septum pellucidum has minimally decreased in size. No acute infarct, mass, or hydrocephalus is evident. There is mild cerebral atrophy. Hypodensities in the cerebral white matter are unchanged and nonspecific but compatible with mild chronic small vessel ischemic disease. A chronic lacunar infarct in the left basal ganglia is unchanged. Vascular: Calcified atherosclerosis at the skull base. Skull: No acute fracture or suspicious  osseous lesion. Sinuses/Orbits: Visualized paranasal sinuses are clear. Small right mastoid effusion. Bilateral cataract extraction. Other: None. IMPRESSION: 1. Decreased overall density and unchanged size of a right cerebral convexity subdural hematoma, now with a multiseptated/loculated appearance. 5 mm of persistent leftward midline shift. 2. Minimally decreased size of a suspected 2 mm focus of hemorrhage along the septum pellucidum. Electronically Signed   By: Sebastian Ache M.D.   On: 06/22/2023 13:18    Assessment/Plan    Family/ staff Communication: Discussed plan of care with resident and charge nurse  Labs/tests ordered:     Kenard Gower, DNP, MSN, FNP-BC [facility] [phone] (Monday-Friday 8:00 a.m. - 5:00 p.m.) [phone] (after hours)

## 2023-07-06 ENCOUNTER — Encounter: Payer: Self-pay | Admitting: Nurse Practitioner

## 2023-07-06 ENCOUNTER — Encounter (INDEPENDENT_AMBULATORY_CARE_PROVIDER_SITE_OTHER): Payer: Medicare PPO

## 2023-07-06 ENCOUNTER — Ambulatory Visit (INDEPENDENT_AMBULATORY_CARE_PROVIDER_SITE_OTHER): Payer: Medicare PPO | Admitting: Vascular Surgery

## 2023-07-06 ENCOUNTER — Non-Acute Institutional Stay (SKILLED_NURSING_FACILITY): Payer: Self-pay | Admitting: Nurse Practitioner

## 2023-07-06 DIAGNOSIS — G8929 Other chronic pain: Secondary | ICD-10-CM

## 2023-07-06 DIAGNOSIS — L89892 Pressure ulcer of other site, stage 2: Secondary | ICD-10-CM

## 2023-07-06 DIAGNOSIS — E1122 Type 2 diabetes mellitus with diabetic chronic kidney disease: Secondary | ICD-10-CM

## 2023-07-06 DIAGNOSIS — N184 Chronic kidney disease, stage 4 (severe): Secondary | ICD-10-CM | POA: Diagnosis not present

## 2023-07-06 DIAGNOSIS — I5033 Acute on chronic diastolic (congestive) heart failure: Secondary | ICD-10-CM | POA: Diagnosis not present

## 2023-07-06 DIAGNOSIS — R296 Repeated falls: Secondary | ICD-10-CM

## 2023-07-06 DIAGNOSIS — M5442 Lumbago with sciatica, left side: Secondary | ICD-10-CM

## 2023-07-06 DIAGNOSIS — N1832 Chronic kidney disease, stage 3b: Secondary | ICD-10-CM

## 2023-07-06 DIAGNOSIS — I10 Essential (primary) hypertension: Secondary | ICD-10-CM | POA: Diagnosis not present

## 2023-07-06 NOTE — Progress Notes (Signed)
 Location:  Other [place].  Nursing Home Room Number: [facility] [address]A Place of Service:  SNF ([phone]) Abbey Chatters, NP  PCP: [name], MD  Patient Care Team: [name], MD as PCP - General (Family Medicine) Earna Coder, MD as Consulting Physician (Internal Medicine) Lemar Livings Merrily Pew, MD as Consulting Physician (General Surgery) Scarlett Presto, RN (Inactive) as Oncology Nurse Navigator Carmina Miller, MD as Referring Physician (Radiation Oncology)  Extended Emergency Contact Information Primary Emergency Contact: [facility] Phone: [phone] Relation: Daughter Secondary Emergency Contact: Hicklin,Roxanne Mobile Phone: [phone] Relation: Niece  Goals of care: Advanced Directive information    06/30/2023   12:17 AM  Advanced Directives  Does Patient Have a Medical Advance Directive? No  Would patient like information on creating a medical advance directive? No - Patient declined     Chief Complaint  Patient presents with   Hospitalization Follow-up    Hospital Follow up   Discussed the use of AI scribe software for clinical note transcription with the patient, who gave verbal consent to proceed.   HPI:  Pt is a 89 y.o. female seen today for Hospital Follow up. Pt with hx of hypertension, depression, anxiety, history of NSTEMI, diastolic heart failure, metastatic bone cancer, history of stroke on dual antiplatelet therapy, who went to the ED  from [facility] via EMS on 06/29/23 for chief concerns of shortness of breath and fall  She was admitted with CHF exacerbation and required O2 and diuretics  She has CKD stage 4 and has been closely monitored by nephrology  She was discharged three days ago. She reports improvement in shortness of breath and there is a reduction in leg swelling since the hospital stay.  She experiences pain in the left leg, described as a shooting, sharp pain from the groin to the ankle, occurring almost every  time she moves the leg. She also reports weakness and 'flopping' of the foot, for which she used to wear a brace during the day. She has a history of chronic back pain and neuropathy, with an MRI from a year ago showing disc space narrowing, diffuse disc bulge, and degenerative changes from L3 to S1. She has been experiencing this leg pain for the past one to two weeks, since the fall. Overall better at this time.  She is on Tylenol and gabapentin twice a day for pain management, which helps when she remains still.  She has chronic kidney disease stage four and follows up with a nephrologist.   She is scheduled to see a cardiologist soon following the recent congestive heart failure flare.  She has a family history of diabetes and her A1c was noted to be 7.7 during hospitalization. She is not currently on any medication for diabetes.   She has a history of falls, including a fall at home that resulted in a brain bleed. [name] about her ability to safely return home due to mobility issues, stating she cannot walk or get out of a chair without assistance.  She is stressed about her daughter's recent severe illness with the flu, which required ventilator support. This situation has affected her mood, though she does not identify as depressed. Her daughter is currently in [place], and another daughter is assisting with care. She has been residing in her current facility for two months and is concerned about insurance coverage for her stay.    Past Medical History:  Diagnosis Date   Arthritis    Gout   Chronic  kidney disease    Diarrhea    Hypercholesteremia    Hypertension    Neuropathy    feet and lower legs   Seasonal allergies    Skin cancer    face   Past Surgical History:  Procedure Laterality Date   ABDOMINAL HYSTERECTOMY  2000   APPENDECTOMY     BREAST BIOPSY Right 09/19/2019   affirm bx of calcs UOQ, x marker, path pending   BREAST BIOPSY Right 09/19/2019   [place] bx  of mass,heart marker, path pending   BREAST BIOPSY Right 09/19/2019   [place] bx of LN, coil marker, path pending   CATARACT EXTRACTION Left    CATARACT EXTRACTION W/PHACO Right 10/24/2014   Procedure: CATARACT EXTRACTION PHACO AND INTRAOCULAR LENS PLACEMENT (IOC);  Surgeon: Lockie Mola, MD;  Location: [facility];  Service: Ophthalmology;  Laterality: Right;   ESOPHAGOGASTRODUODENOSCOPY N/A 03/07/2018   Procedure: ESOPHAGOGASTRODUODENOSCOPY (EGD);  Surgeon: Toney Reil, MD;  Location: [facility];  Service: Gastroenterology;  Laterality: N/A;   MASTECTOMY MODIFIED RADICAL Right 10/13/2019   Procedure: MASTECTOMY MODIFIED RADICAL;  Surgeon: Earline Mayotte, MD;  Location: [facility] ORS;  Service: General;  Laterality: Right;   RENAL ANGIOGRAPHY Right 04/11/2018   Procedure: RENAL ANGIOGRAPHY;  Surgeon: Annice Needy, MD;  Location: [facility] INVASIVE CV LAB;  Service: Cardiovascular;  Laterality: Right;   SIMPLE MASTECTOMY WITH AXILLARY SENTINEL NODE BIOPSY Left 10/13/2019   Procedure: SIMPLE MASTECTOMY TRUE CUT BIOPSY, SENTINEL NODE BIOPSY;  Surgeon: Earline Mayotte, MD;  Location: [facility] ORS;  Service: General;  Laterality: Left;    Allergies  Allergen Reactions   Penicillins Anaphylaxis    Patient tolerates ceftriaxone   Drug Ingredient [Zinc]    Empagliflozin Rash    Outpatient Encounter Medications as of 07/06/2023  Medication Sig   acetaminophen (TYLENOL) 325 MG tablet Take 1-2 tablets (325-650 mg total) by mouth every 6 (six) hours as needed for mild pain (pain score 1-3), moderate pain (pain score 4-6), fever or headache.   allopurinol (ZYLOPRIM) 100 MG tablet Take 100 mg by mouth daily.   amLODipine (NORVASC) 10 MG tablet Take 10 mg by mouth at bedtime.   anastrozole (ARIMIDEX) 1 MG tablet Take 1 tablet (1 mg total) by mouth daily.   calcium carbonate (OS-CAL - DOSED IN MG OF ELEMENTAL CALCIUM) 1250 (500 Ca) MG tablet Take 2 tablets by mouth daily with breakfast.    citalopram (CELEXA) 10 MG tablet Take 10 mg by mouth daily.    cyanocobalamin (VITAMIN B12) 1000 MCG tablet Take 1,000 mcg by mouth daily.   diclofenac Sodium (VOLTAREN) 1 % GEL Apply 2 g topically 2 (two) times daily. To bilateral legs.   docusate sodium (COLACE) 100 MG capsule Take 100 mg by mouth daily as needed for mild constipation.   DULoxetine (CYMBALTA) 60 MG capsule Take 60 mg by mouth daily.   furosemide (LASIX) 40 MG tablet Take 0.5 tablets (20 mg total) by mouth daily. Increase to 1 tablet (40 mg total) by mouth daily (total daily dose 40 mg) as needed for up to 3 days for increased leg swelling, shortness of breath, weight gain 5+ lbs over 1-2 days. Seek medical advice if these symptoms are not improving with increased dose.   gabapentin (NEURONTIN) 300 MG capsule Take 300 mg by mouth 2 (two) times daily.   guaifenesin (ROBITUSSIN) 100 MG/5ML syrup Take 200 mg by mouth every 4 (four) hours as needed for cough.   hydrALAZINE (APRESOLINE) 50 MG tablet Take 1  tablet (50 mg total) by mouth 3 (three) times daily. Skip the dose if SBP less than 130 mmHg   loratadine (CLARITIN) 10 MG tablet Take 10 mg by mouth daily as needed for allergies. AM   melatonin 3 MG TABS tablet Take 3 mg by mouth at bedtime.   metoprolol succinate (TOPROL-XL) 25 MG 24 hr tablet Take 1 tablet (25 mg total) by mouth daily.   polyethylene glycol (MIRALAX / GLYCOLAX) 17 g packet Take 17 g by mouth daily. Skip the dose if no constipation   SANTYL 250 UNIT/GM ointment Apply 1 Application topically daily. apply to left medial cuneiform every day   senna (SENOKOT) 8.6 MG TABS tablet Take 1 tablet by mouth every other day.   Simethicone (GAS-X PO) Take 1 capsule by mouth every 8 (eight) hours as needed (for gas).   simvastatin (ZOCOR) 20 MG tablet Take 20 mg by mouth every evening. PM   sodium bicarbonate 650 MG tablet Take 650 mg by mouth every 8 (eight) hours as needed for heartburn.   valsartan (DIOVAN) 160 MG tablet  Take 160 mg by mouth daily.   Vitamin D, Ergocalciferol, (DRISDOL) 1.25 MG (50000 UNIT) CAPS capsule Take 50,000 Units by mouth every 7 (seven) days.   Wheat Dextrin (BENEFIBER ON THE GO) PACK Take 1 packet by mouth daily as needed.   Zinc Oxide (TRIPLE PASTE) 12.8 % ointment Apply 1 Application topically. To buttocks every shift. (Patient not taking: Reported on 06/29/2023)   No facility-administered encounter medications on file as of 07/06/2023.    Review of Systems  Constitutional:  Negative for activity change, appetite change, fatigue and unexpected weight change.  HENT:  Negative for congestion and hearing loss.   Eyes: Negative.   Respiratory:  Positive for shortness of breath. Negative for cough.   Cardiovascular:  Negative for chest pain, palpitations and leg swelling.  Gastrointestinal:  Negative for abdominal pain, constipation and diarrhea.  Genitourinary:  Negative for difficulty urinating and dysuria.  Musculoskeletal:  Positive for arthralgias, back pain and gait problem. Negative for myalgias.  Skin:  Negative for color change and wound.  Neurological:  Positive for weakness and numbness. Negative for dizziness.  Psychiatric/Behavioral:  Negative for agitation, behavioral problems and confusion.     Immunization History  Administered Date(s) Administered   Influenza Inj Mdck Quad Pf 03/26/2016, 02/14/2018, 02/15/2019, 02/25/2021, 03/02/2022   Influenza Split 02/20/2015   Influenza, Seasonal, Injecte, Preservative Fre 04/13/2012, 03/17/2013   Influenza,inj,Quad PF,6+ Mos 03/07/2014, 02/20/2020   Influenza-Unspecified 03/26/2016, 02/04/2017, 02/14/2018, 02/25/2021, 04/12/2023   Moderna Sars-Covid-2 Vaccination 05/24/2019, 06/24/2019, 02/22/2020, 02/26/2022   PNEUMOCOCCAL CONJUGATE-20 04/09/2023   Pneumococcal Conjugate-13 09/01/2013   Pneumococcal Polysaccharide-23 04/13/2012   RSV,unspecified 04/09/2023   Td 08/07/2010   Tdap 08/07/2010   Zoster, Live 09/12/2007    Pertinent  Health Maintenance Due  Topic Date Due   FOOT EXAM  Never done   OPHTHALMOLOGY EXAM  Never done   HEMOGLOBIN A1C  12/19/2023   INFLUENZA VACCINE  Completed   DEXA SCAN  Completed      01/09/2022   10:55 AM 02/18/2022    1:36 PM 03/18/2022    4:19 PM 03/18/2022    4:46 PM 05/13/2022   10:17 AM  Fall Risk  (RETIRED) Patient Fall Risk Level High fall risk High fall risk Moderate fall risk Moderate fall risk High fall risk   Functional Status Survey:    Vitals:   07/06/23 0955  BP: (!) 146/73  Pulse: 91  Resp:  17  Temp: 98.3 F (36.8 C)  SpO2: 96%  Weight: 143 lb (64.9 kg)  Height: 5\' 2"  (1.575 m)   Body mass index is 26.16 kg/m. Physical Exam Constitutional:      General: She is not in acute distress.    Appearance: She is well-developed. She is not diaphoretic.  HENT:     Head: Normocephalic and atraumatic.     Mouth/Throat:     Pharynx: No oropharyngeal exudate.  Eyes:     Conjunctiva/sclera: Conjunctivae normal.     Pupils: Pupils are equal, round, and reactive to light.  Cardiovascular:     Rate and Rhythm: Normal rate and regular rhythm.     Heart sounds: Normal heart sounds.  Pulmonary:     Effort: Pulmonary effort is normal.     Breath sounds: Normal breath sounds.  Abdominal:     General: Bowel sounds are normal.     Palpations: Abdomen is soft.  Musculoskeletal:     Cervical back: Normal range of motion and neck supple.     Right lower leg: No edema.     Left lower leg: No edema.  Skin:    General: Skin is warm and dry.  Neurological:     Mental Status: She is alert.  Psychiatric:        Mood and Affect: Mood normal.     Labs reviewed: Recent Labs    04/01/23 0519 04/12/23 0000 05/08/23 0402 05/09/23 0436 05/10/23 0210 05/11/23 0515 07/01/23 0510 07/02/23 0504 07/03/23 0454 07/05/23 0000  NA 138   < > 136 135 133*   < > 141 139 140 140  K 4.4   < > 3.7 3.8 4.0   < > 3.5 3.6 3.7 4.1  CL 107   < > 108 106 105   < > 105 105  105 105  CO2 21*   < > 19* 18* 19*   < > 25 25 25  29*  GLUCOSE 156*   < > 136* 119* 137*   < > 141* 115* 113*  --   BUN 39*   < > 31* 34* 39*   < > 27* 27* 28* 24*  CREATININE 1.58*   < > 1.31* 1.47* 1.56*   < > 1.74* 1.58* 1.56* 1.6*  CALCIUM 8.5*   < > 8.9 8.8* 8.6*   < > 9.0 9.1 9.5 9.7  MG 2.4  --  1.7 2.0  --   --   --   --   --   --   PHOS 3.0  --  3.3 3.4 2.9  --   --   --   --   --    < > = values in this interval not displayed.   Recent Labs    02/22/23 1444 05/08/23 0402 05/10/23 0210 05/27/23 0909 06/29/23 0823  AST 24  --   --  23 21  ALT 14  --   --  15 11  ALKPHOS 62  --   --  66 61  BILITOT 0.4  --   --  0.6 0.7  PROT 7.5  --   --  7.1 7.0  ALBUMIN 4.1   < > 3.5 3.7 3.5   < > = values in this interval not displayed.   Recent Labs    05/27/23 0909 06/21/23 0000 06/29/23 0823 06/30/23 0452 07/01/23 0510  WBC 8.9 7.1  7.1 10.4 7.8 7.1  NEUTROABS 5.5 3,862.00 6.9  --   --  HGB 10.8* 10.2* 10.0* 9.4* 9.6*  HCT 32.7* 31* 29.8* 28.2* 28.4*  MCV 93.4  --  92.3 91.3 91.0  PLT 223 246 234 235 233   Lab Results  Component Value Date   TSH 2.573 08/26/2020   Lab Results  Component Value Date   HGBA1C 7.7 06/21/2023   Lab Results  Component Value Date   CHOL 75 08/27/2022   HDL 22 (L) 08/27/2022   LDLCALC 34 08/27/2022   TRIG 146 05/08/2023   CHOLHDL 3.4 08/27/2022    Significant Diagnostic Results in last 30 days:  CT Angio Chest PE W and/or Wo Contrast Result Date: 06/29/2023 CLINICAL DATA:  Shortness of breath and hypoxia. Concern for pulmonary embolism. Recent fall. EXAM: CT ANGIOGRAPHY CHEST WITH CONTRAST TECHNIQUE: Multidetector CT imaging of the chest was performed using the standard protocol during bolus administration of intravenous contrast. Multiplanar CT image reconstructions and MIPs were obtained to evaluate the vascular anatomy. RADIATION DOSE REDUCTION: This exam was performed according to the departmental dose-optimization program which  includes automated exposure control, adjustment of the mA and/or kV according to patient size and/or use of iterative reconstruction technique. CONTRAST:  50mL OMNIPAQUE IOHEXOL 350 MG/ML SOLN COMPARISON:  Chest CT dated 03/29/2023. Radiograph dated 06/29/2023. FINDINGS: Cardiovascular: Mild cardiomegaly. No pericardial effusion. There is coronary vascular calcification. Moderate calcified and noncalcified plaque of the thoracic aorta. No aneurysmal dilatation or dissection. The origins of the great vessels of the aortic arch appear patent. Evaluation of the pulmonary arteries is limited due to respiratory motion. No pulmonary artery embolus identified. Mediastinum/Nodes: No hilar or mediastinal adenopathy. The esophagus is grossly unremarkable. No mediastinal fluid collection. Lungs/Pleura: Small left and moderate right pleural effusions with partial compressive atelectasis of the lower lobes. There is diffuse interstitial and interlobular septal prominence consistent with edema. There is no pneumothorax. The central airways are patent. Upper Abdomen: Indeterminate cystic pancreatic lesions as seen on the prior CT and better evaluated on the CT abdomen pelvis of 08/22/2022. Musculoskeletal: Osteopenia with degenerative changes of the spine. No acute osseous pathology. There is stranding and edema of the chest wall. Review of the MIP images confirms the above findings. IMPRESSION: 1. No CT evidence of pulmonary artery embolus. 2. Mild cardiomegaly with pulmonary edema and bilateral pleural effusions, right greater than left. 3. Bilateral lower lobe atelectasis or pneumonia. 4.  Aortic Atherosclerosis (ICD10-I70.0). Electronically Signed   By: Elgie Collard M.D.   On: 06/29/2023 12:17   DG Hip Unilat W or Wo Pelvis 2-3 Views Left Result Date: 06/29/2023 CLINICAL DATA:  Left hip pain after a fall a few days ago EXAM: DG HIP (WITH OR WITHOUT PELVIS) 2-3V LEFT COMPARISON:  05/06/2023 FINDINGS: AP view of the  pelvis and AP/frog leg views of the left hip. Femoral heads are located. Degenerative sclerosis of the left sacroiliac joint. No acute fracture. IMPRESSION: No acute osseous abnormality. Electronically Signed   By: Jeronimo Greaves M.D.   On: 06/29/2023 10:26   DG Chest Port 1 View Result Date: 06/29/2023 CLINICAL DATA:  Short of breath and hypoxic. EXAM: PORTABLE CHEST 1 VIEW COMPARISON:  08/26/2022 FINDINGS: Surgical clips project over the right axilla. Patient is rotated to the right. Cardiomegaly accentuated by AP portable technique. Atherosclerosis in the transverse aorta. Probable small right pleural effusion. No pneumothorax. Progressive interstitial prominence and indistinctness. Similar left and increased right base airspace disease. IMPRESSION: Moderate congestive heart failure. New or increased small right pleural effusion. Persistent left and increased right lower lung airspace  disease. Most likely atelectasis. Cannot exclude concurrent pneumonia or aspiration. Electronically Signed   By: Jeronimo Greaves M.D.   On: 06/29/2023 10:23   CT HEAD WO CONTRAST ( ) Result Date: 06/22/2023 CLINICAL DATA:  Follow-up subdural hematoma. EXAM: CT HEAD WITHOUT CONTRAST TECHNIQUE: Contiguous axial images were obtained from the base of the skull through the vertex without intravenous contrast. RADIATION DOSE REDUCTION: This exam was performed according to the departmental dose-optimization program which includes automated exposure control, adjustment of the mA and/or kV according to patient size and/or use of iterative reconstruction technique. COMPARISON:  Head CT 05/06/2023 FINDINGS: Brain: A subdural hematoma over the right cerebral convexity is similar in size to the prior CT, measuring up to 2.6 cm in thickness. The collection has overall decreased in density compared to the prior CT with interval development of multiple thick internal septations. Leftward midline shift measures 5 mm, minimally decreased. A 2 mm  mildly hyperdense focus along the septum pellucidum has minimally decreased in size. No acute infarct, mass, or hydrocephalus is evident. There is mild cerebral atrophy. Hypodensities in the cerebral white matter are unchanged and nonspecific but compatible with mild chronic small vessel ischemic disease. A chronic lacunar infarct in the left basal ganglia is unchanged. Vascular: Calcified atherosclerosis at the skull base. Skull: No acute fracture or suspicious osseous lesion. Sinuses/Orbits: Visualized paranasal sinuses are clear. Small right mastoid effusion. Bilateral cataract extraction. Other: None. IMPRESSION: 1. Decreased overall density and unchanged size of a right cerebral convexity subdural hematoma, now with a multiseptated/loculated appearance. 5 mm of persistent leftward midline shift. 2. Minimally decreased size of a suspected 2 mm focus of hemorrhage along the septum pellucidum. Electronically Signed   By: Sebastian Ache M.D.   On: 06/22/2023 13:18    Assessment/Plan Acute Heart Failure Recent hospitalization for acute heart failure. Currently experiencing intermittent shortness of breath, but overall improvement. No significant leg swelling. Has follow up with cardiology scheduled.  -Continue diuretics as prescribed. -Monitor for worsening symptoms.  Chronic Kidney Disease (Stage 4) Follows with nephrologist. BMP next lab day  Chronic Back Pain/Sciatica Experiencing sharp, shooting pain in left leg from groin to ankle, weakness, and foot drop. History of degenerative changes at L3, 4 through L5, S1 with mild canal and bilateral stenosis. -Continue physical therapy. -Continue Tylenol and Gabapentin for pain management.  Diabetes Elevated A1C, not currently on medication. -Start checking morning blood sugars.  Stage 3 pressure ulcer to left foot -Continue foam dressing with santyl, wound care to monitor.  -Monitor for worsening or signs of infection.  Fall Risk/recurrent  falls Recent fall leading to hospitalization. Difficulty walking and getting out of chair independently. -Ensure safe environment to prevent falls. -Continue physical therapy to improve strength and mobility.  Hypertension -Blood pressure well controlled, goal bp <140/90 Continue current medications and dietary modifications follow metabolic panel   Gerica Koble K. Biagio Borg [facility] [phone]

## 2023-07-08 LAB — BASIC METABOLIC PANEL
BUN: 33 — AB (ref 4–21)
CO2: 26 — AB (ref 13–22)
Chloride: 105 (ref 99–108)
Creatinine: 1.7 — AB (ref 0.5–1.1)
Glucose: 118
Potassium: 4.1 meq/L (ref 3.5–5.1)
Sodium: 142 (ref 137–147)

## 2023-07-08 LAB — COMPREHENSIVE METABOLIC PANEL: Calcium: 9.3 (ref 8.7–10.7)

## 2023-07-08 LAB — CBC: RBC: 3.29 — AB (ref 3.87–5.11)

## 2023-07-08 LAB — CBC AND DIFFERENTIAL
HCT: 31 — AB (ref 36–46)
Hemoglobin: 9.8 — AB (ref 12.0–16.0)
Neutrophils Absolute: 3262
Platelets: 253 10*3/uL (ref 150–400)
WBC: 7.2

## 2023-07-09 ENCOUNTER — Encounter: Payer: Medicare PPO | Admitting: Family

## 2023-07-12 ENCOUNTER — Non-Acute Institutional Stay (SKILLED_NURSING_FACILITY): Payer: Self-pay | Admitting: Student

## 2023-07-12 ENCOUNTER — Encounter: Payer: Self-pay | Admitting: Student

## 2023-07-12 DIAGNOSIS — N184 Chronic kidney disease, stage 4 (severe): Secondary | ICD-10-CM

## 2023-07-12 DIAGNOSIS — N1832 Chronic kidney disease, stage 3b: Secondary | ICD-10-CM

## 2023-07-12 DIAGNOSIS — I701 Atherosclerosis of renal artery: Secondary | ICD-10-CM | POA: Diagnosis not present

## 2023-07-12 DIAGNOSIS — E1122 Type 2 diabetes mellitus with diabetic chronic kidney disease: Secondary | ICD-10-CM | POA: Diagnosis not present

## 2023-07-12 DIAGNOSIS — M62838 Other muscle spasm: Secondary | ICD-10-CM

## 2023-07-12 DIAGNOSIS — I5031 Acute diastolic (congestive) heart failure: Secondary | ICD-10-CM | POA: Diagnosis not present

## 2023-07-12 DIAGNOSIS — M1030 Gout due to renal impairment, unspecified site: Secondary | ICD-10-CM

## 2023-07-12 DIAGNOSIS — I150 Renovascular hypertension: Secondary | ICD-10-CM | POA: Diagnosis not present

## 2023-07-12 DIAGNOSIS — G2581 Restless legs syndrome: Secondary | ICD-10-CM

## 2023-07-12 DIAGNOSIS — Z853 Personal history of malignant neoplasm of breast: Secondary | ICD-10-CM

## 2023-07-12 NOTE — Progress Notes (Signed)
 Location:  Other ([facility]) Nursing Home Room Number: [place] [address] [facility]) Place of Service:  SNF [phone]) Provider:  Earnestine Mealing, MD    Patient Care Team: [name], MD as PCP - General (Family Medicine) Earna Coder, MD as Consulting Physician (Internal Medicine) Lemar Livings Merrily Pew, MD as Consulting Physician (General Surgery) Scarlett Presto, RN (Inactive) as Oncology Nurse Navigator Carmina Miller, MD as Referring Physician (Radiation Oncology)  Extended Emergency Contact Information Primary Emergency Contact: [facility] Phone: [phone] Relation: Daughter Secondary Emergency Contact: Hicklin,Roxanne Mobile Phone: [phone] Relation: Niece  Code Status:  DNR Goals of care: Advanced Directive information    07/12/2023    9:59 AM  Advanced Directives  Does Patient Have a Medical Advance Directive? Yes  Type of Advance Directive Out of facility DNR (pink MOST or yellow form)  Does patient want to make changes to medical advance directive? No - Patient declined  Would patient like information on creating a medical advance directive? No - Patient declined  Pre-existing out of facility DNR order (yellow form or pink MOST form) Yellow form placed in chart (order not valid for inpatient use)     Chief Complaint  Patient presents with   Acute Visit    Admission    HPI:  Pt is a 89 y.o. female seen today for an acute visit for Admission to [facility].   History of Present Illness The patient is a 89 year old female with CHF and a history of falls who presents with left hip pain and shortness of breath.  She was recently admitted for an acute exacerbation of CHF on February 18th after experiencing shortness of breath following a fall on February 15th. During the fall, she slid out of her chair and landed on her left hip. At the time of admission, her BNP was 680, and she was started on diuretics for HFPEF exacerbation. She  is currently on furosemide 40 mg daily and follows with nephrology for her kidney function. No current use of oxygen and her breathing has been okay since the hospital discharge. She notes decreased breath sounds on the right side.  She reports left hip pain that started after her fall. The pain begins in the groin and radiates down her leg, exacerbated by movement. No buttock pain but soreness across her lower back. She uses diclofenac gel four times daily for pain relief. An x-ray of the hip showed no acute osteoarthritis.  She experiences muscle spasms in her leg, which she attributes to overuse of a resistance band during physical therapy. The spasms cause significant discomfort and impact her ability to walk, which is important to her. She uses hot pads for relief and wants to return to normal activities, including walking independently.  She has a history of diabetes and reports elevated blood sugars, acknowledging increased consumption of sweets, which may be contributing to her hyperglycemia. Her current medications include amlodipine, valsartan, metoprolol, and hydralazine for blood pressure management.  She has a history of breast cancer and is on anastrozole 1 mg daily.  Past Medical History:  Diagnosis Date   Arthritis    Gout   Chronic kidney disease    Diarrhea    Hypercholesteremia    Hypertension    Neuropathy    feet and lower legs   Seasonal allergies    Skin cancer    face   Past Surgical History:  Procedure Laterality Date   ABDOMINAL HYSTERECTOMY  2000   APPENDECTOMY  BREAST BIOPSY Right 09/19/2019   affirm bx of calcs UOQ, x marker, path pending   BREAST BIOPSY Right 09/19/2019   [place] bx of mass,heart marker, path pending   BREAST BIOPSY Right 09/19/2019   [place] bx of LN, coil marker, path pending   CATARACT EXTRACTION Left    CATARACT EXTRACTION W/PHACO Right 10/24/2014   Procedure: CATARACT EXTRACTION PHACO AND INTRAOCULAR LENS PLACEMENT (IOC);  Surgeon:  Lockie Mola, MD;  Location: [facility];  Service: Ophthalmology;  Laterality: Right;   ESOPHAGOGASTRODUODENOSCOPY N/A 03/07/2018   Procedure: ESOPHAGOGASTRODUODENOSCOPY (EGD);  Surgeon: Toney Reil, MD;  Location: [facility] ENDOSCOPY;  Service: Gastroenterology;  Laterality: N/A;   MASTECTOMY MODIFIED RADICAL Right 10/13/2019   Procedure: MASTECTOMY MODIFIED RADICAL;  Surgeon: Earline Mayotte, MD;  Location: [facility] ORS;  Service: General;  Laterality: Right;   RENAL ANGIOGRAPHY Right 04/11/2018   Procedure: RENAL ANGIOGRAPHY;  Surgeon: Annice Needy, MD;  Location: [facility] INVASIVE CV LAB;  Service: Cardiovascular;  Laterality: Right;   SIMPLE MASTECTOMY WITH AXILLARY SENTINEL NODE BIOPSY Left 10/13/2019   Procedure: SIMPLE MASTECTOMY TRUE CUT BIOPSY, SENTINEL NODE BIOPSY;  Surgeon: Earline Mayotte, MD;  Location: [facility] ORS;  Service: General;  Laterality: Left;    Allergies  Allergen Reactions   Penicillins Anaphylaxis    Patient tolerates ceftriaxone   Drug Ingredient [Zinc]    Empagliflozin Rash    Outpatient Encounter Medications as of 07/12/2023  Medication Sig   acetaminophen (TYLENOL) 325 MG tablet Take 1-2 tablets (325-650 mg total) by mouth every 6 (six) hours as needed for mild pain (pain score 1-3), moderate pain (pain score 4-6), fever or headache.   allopurinol (ZYLOPRIM) 100 MG tablet Take 100 mg by mouth daily.   amLODipine (NORVASC) 10 MG tablet Take 10 mg by mouth at bedtime.   anastrozole (ARIMIDEX) 1 MG tablet Take 1 tablet (1 mg total) by mouth daily.   calcium carbonate (OS-CAL - DOSED IN MG OF ELEMENTAL CALCIUM) 1250 (500 Ca) MG tablet Take 2 tablets by mouth daily with breakfast.   citalopram (CELEXA) 10 MG tablet Take 10 mg by mouth daily.    cyanocobalamin (VITAMIN B12) 1000 MCG tablet Take 1,000 mcg by mouth daily.   diclofenac Sodium (VOLTAREN) 1 % GEL Apply 2 g topically 2 (two) times daily. To bilateral legs.   docusate sodium (COLACE) 100 MG  capsule Take 100 mg by mouth daily as needed for mild constipation.   DULoxetine (CYMBALTA) 60 MG capsule Take 60 mg by mouth daily.   furosemide (LASIX) 40 MG tablet Take 0.5 tablets (20 mg total) by mouth daily. Increase to 1 tablet (40 mg total) by mouth daily (total daily dose 40 mg) as needed for up to 3 days for increased leg swelling, shortness of breath, weight gain 5+ lbs over 1-2 days. Seek medical advice if these symptoms are not improving with increased dose.   gabapentin (NEURONTIN) 300 MG capsule Take 300 mg by mouth 2 (two) times daily.   guaifenesin (ROBITUSSIN) 100 MG/5ML syrup Take 200 mg by mouth every 4 (four) hours as needed for cough.   hydrALAZINE (APRESOLINE) 50 MG tablet Take 1 tablet (50 mg total) by mouth 3 (three) times daily. Skip the dose if SBP less than 130 mmHg   loratadine (CLARITIN) 10 MG tablet Take 10 mg by mouth daily as needed for allergies. AM   melatonin 3 MG TABS tablet Take 3 mg by mouth at bedtime.   metoprolol succinate (TOPROL-XL) 25 MG 24 hr  tablet Take 1 tablet (25 mg total) by mouth daily.   polyethylene glycol (MIRALAX / GLYCOLAX) 17 g packet Take 17 g by mouth daily. Skip the dose if no constipation   SANTYL 250 UNIT/GM ointment Apply 1 Application topically daily. apply to left medial cuneiform every day   senna (SENOKOT) 8.6 MG TABS tablet Take 1 tablet by mouth every other day.   Simethicone (GAS-X PO) Take 1 capsule by mouth every 8 (eight) hours as needed (for gas).   simvastatin (ZOCOR) 20 MG tablet Take 20 mg by mouth every evening. PM   sodium bicarbonate 650 MG tablet Take 650 mg by mouth every 8 (eight) hours as needed for heartburn.   valsartan (DIOVAN) 160 MG tablet Take 160 mg by mouth daily.   Vitamin D, Ergocalciferol, (DRISDOL) 1.25 MG (50000 UNIT) CAPS capsule Take 50,000 Units by mouth every 7 (seven) days.   Wheat Dextrin (BENEFIBER ON THE GO) PACK Take 1 packet by mouth daily as needed.   Zinc Oxide (TRIPLE PASTE) 12.8 %  ointment Apply 1 Application topically. To buttocks every shift. (Patient not taking: Reported on 06/29/2023)   No facility-administered encounter medications on file as of 07/12/2023.    Review of Systems  Immunization History  Administered Date(s) Administered   Influenza Inj Mdck Quad Pf 03/26/2016, 02/14/2018, 02/15/2019, 02/25/2021, 03/02/2022   Influenza Split 02/20/2015   Influenza, Seasonal, Injecte, Preservative Fre 04/13/2012, 03/17/2013   Influenza,inj,Quad PF,6+ Mos 03/07/2014, 02/20/2020   Influenza-Unspecified 03/26/2016, 02/04/2017, 02/14/2018, 02/25/2021, 04/12/2023   Moderna Sars-Covid-2 Vaccination 05/24/2019, 06/24/2019, 02/22/2020, 02/26/2022   PNEUMOCOCCAL CONJUGATE-20 04/09/2023   Pneumococcal Conjugate-13 09/01/2013   Pneumococcal Polysaccharide-23 04/13/2012   RSV,unspecified 04/09/2023   Td 08/07/2010   Tdap 08/07/2010   Zoster, Live 09/12/2007   Pertinent  Health Maintenance Due  Topic Date Due   FOOT EXAM  Never done   OPHTHALMOLOGY EXAM  Never done   HEMOGLOBIN A1C  12/19/2023   INFLUENZA VACCINE  Completed   DEXA SCAN  Completed      01/09/2022   10:55 AM 02/18/2022    1:36 PM 03/18/2022    4:19 PM 03/18/2022    4:46 PM 05/13/2022   10:17 AM  Fall Risk  (RETIRED) Patient Fall Risk Level High fall risk High fall risk Moderate fall risk Moderate fall risk High fall risk   Functional Status Survey:    Vitals:   07/12/23 0943  BP: (!) 154/62  Pulse: 82  Resp: 18  Temp: 97.9 F (36.6 C)  SpO2: 93%  Weight: 146 lb 6.4 oz (66.4 kg)  Height: 5\' 2"  (1.575 m)   Body mass index is 26.78 kg/m. Physical Exam Constitutional:      Appearance: Normal appearance.  Cardiovascular:     Rate and Rhythm: Normal rate and regular rhythm.     Pulses: Normal pulses.     Heart sounds: Normal heart sounds.  Pulmonary:     Effort: Pulmonary effort is normal.     Comments: Lungs clear to auscultation with decreased breath sounds on the right.  Abdominal:      General: Abdomen is flat. Bowel sounds are normal.     Palpations: Abdomen is soft.     Tenderness: Guarding: nr.  Musculoskeletal:        General: Tenderness present. No swelling.     Comments: Left lower extremity weakness.  Skin:    General: Skin is warm and dry.     Comments: Left great toe with 0.5cm ulceration present  Neurological:  Mental Status: She is alert and oriented to person, place, and time.     Gait: Gait normal.  Psychiatric:        Mood and Affect: Mood normal.     Labs reviewed: Recent Labs    04/01/23 0519 04/12/23 0000 05/08/23 0402 05/09/23 0436 05/10/23 0210 05/11/23 0515 07/01/23 0510 07/02/23 0504 07/03/23 0454 07/05/23 0000 07/08/23 0000  NA 138   < > 136 135 133*   < > 141 139 140 140 142  K 4.4   < > 3.7 3.8 4.0   < > 3.5 3.6 3.7 4.1 4.1  CL 107   < > 108 106 105   < > 105 105 105 105 105  CO2 21*   < > 19* 18* 19*   < > 25 25 25  29* 26*  GLUCOSE 156*   < > 136* 119* 137*   < > 141* 115* 113*  --   --   BUN 39*   < > 31* 34* 39*   < > 27* 27* 28* 24* 33*  CREATININE 1.58*   < > 1.31* 1.47* 1.56*   < > 1.74* 1.58* 1.56* 1.6* 1.7*  CALCIUM 8.5*   < > 8.9 8.8* 8.6*   < > 9.0 9.1 9.5 9.7 9.3  MG 2.4  --  1.7 2.0  --   --   --   --   --   --   --   PHOS 3.0  --  3.3 3.4 2.9  --   --   --   --   --   --    < > = values in this interval not displayed.   Recent Labs    02/22/23 1444 05/08/23 0402 05/10/23 0210 05/27/23 0909 06/29/23 0823  AST 24  --   --  23 21  ALT 14  --   --  15 11  ALKPHOS 62  --   --  66 61  BILITOT 0.4  --   --  0.6 0.7  PROT 7.5  --   --  7.1 7.0  ALBUMIN 4.1   < > 3.5 3.7 3.5   < > = values in this interval not displayed.   Recent Labs    06/21/23 0000 06/29/23 0823 06/30/23 0452 07/01/23 0510 07/08/23 0000  WBC 7.1  7.1 10.4 7.8 7.1 7.2  NEUTROABS 3,862.00 6.9  --   --  3,262.00  HGB 10.2* 10.0* 9.4* 9.6* 9.8*  HCT 31* 29.8* 28.2* 28.4* 31*  MCV  --  92.3 91.3 91.0  --   PLT 246 234 235 233 253    Lab Results  Component Value Date   TSH 2.573 08/26/2020   Lab Results  Component Value Date   HGBA1C 7.7 06/21/2023   Lab Results  Component Value Date   CHOL 75 08/27/2022   HDL 22 (L) 08/27/2022   LDLCALC 34 08/27/2022   TRIG 146 05/08/2023   CHOLHDL 3.4 08/27/2022    Significant Diagnostic Results in last 30 days:  CT Angio Chest PE W and/or Wo Contrast Result Date: 06/29/2023 CLINICAL DATA:  Shortness of breath and hypoxia. Concern for pulmonary embolism. Recent fall. EXAM: CT ANGIOGRAPHY CHEST WITH CONTRAST TECHNIQUE: Multidetector CT imaging of the chest was performed using the standard protocol during bolus administration of intravenous contrast. Multiplanar CT image reconstructions and MIPs were obtained to evaluate the vascular anatomy. RADIATION DOSE REDUCTION: This exam was performed according to the departmental dose-optimization program which  includes automated exposure control, adjustment of the mA and/or kV according to patient size and/or use of iterative reconstruction technique. CONTRAST:  50mL OMNIPAQUE IOHEXOL 350 MG/ML SOLN COMPARISON:  Chest CT dated 03/29/2023. Radiograph dated 06/29/2023. FINDINGS: Cardiovascular: Mild cardiomegaly. No pericardial effusion. There is coronary vascular calcification. Moderate calcified and noncalcified plaque of the thoracic aorta. No aneurysmal dilatation or dissection. The origins of the great vessels of the aortic arch appear patent. Evaluation of the pulmonary arteries is limited due to respiratory motion. No pulmonary artery embolus identified. Mediastinum/Nodes: No hilar or mediastinal adenopathy. The esophagus is grossly unremarkable. No mediastinal fluid collection. Lungs/Pleura: Small left and moderate right pleural effusions with partial compressive atelectasis of the lower lobes. There is diffuse interstitial and interlobular septal prominence consistent with edema. There is no pneumothorax. The central airways are patent.  Upper Abdomen: Indeterminate cystic pancreatic lesions as seen on the prior CT and better evaluated on the CT abdomen pelvis of 08/22/2022. Musculoskeletal: Osteopenia with degenerative changes of the spine. No acute osseous pathology. There is stranding and edema of the chest wall. Review of the MIP images confirms the above findings. IMPRESSION: 1. No CT evidence of pulmonary artery embolus. 2. Mild cardiomegaly with pulmonary edema and bilateral pleural effusions, right greater than left. 3. Bilateral lower lobe atelectasis or pneumonia. 4.  Aortic Atherosclerosis (ICD10-I70.0). Electronically Signed   By: Elgie Collard M.D.   On: 06/29/2023 12:17   DG Hip Unilat W or Wo Pelvis 2-3 Views Left Result Date: 06/29/2023 CLINICAL DATA:  Left hip pain after a fall a few days ago EXAM: DG HIP (WITH OR WITHOUT PELVIS) 2-3V LEFT COMPARISON:  05/06/2023 FINDINGS: AP view of the pelvis and AP/frog leg views of the left hip. Femoral heads are located. Degenerative sclerosis of the left sacroiliac joint. No acute fracture. IMPRESSION: No acute osseous abnormality. Electronically Signed   By: Jeronimo Greaves M.D.   On: 06/29/2023 10:26   DG Chest Port 1 View Result Date: 06/29/2023 CLINICAL DATA:  Short of breath and hypoxic. EXAM: PORTABLE CHEST 1 VIEW COMPARISON:  08/26/2022 FINDINGS: Surgical clips project over the right axilla. Patient is rotated to the right. Cardiomegaly accentuated by AP portable technique. Atherosclerosis in the transverse aorta. Probable small right pleural effusion. No pneumothorax. Progressive interstitial prominence and indistinctness. Similar left and increased right base airspace disease. IMPRESSION: Moderate congestive heart failure. New or increased small right pleural effusion. Persistent left and increased right lower lung airspace disease. Most likely atelectasis. Cannot exclude concurrent pneumonia or aspiration. Electronically Signed   By: Jeronimo Greaves M.D.   On: 06/29/2023 10:23   LABS BNP: 680 (06/29/2023) Creatinine: 1.7 (07/12/2023)  RADIOLOGY CTA Chest: No signs of pulmonary artery embolism. Mild cardiomegaly with pulmonary edema and bilateral pleural effusions, right greater than left. Bilateral lower lobe atelectasis or pneumonia. Hip X-ray: No acute osteoarthritis (06/26/2023) Chest X-ray: Moderate congestive heart failure with new right-sided pleural effusion Head CT: Decreased overall density and unchanged size of right cerebral convexity subdural hematoma now with multiseptated loculated appearance, 5 mm of persistent leftward midline shift, minimally decreased size of suspected 2 mm focus of hemorrhage along septum pellucidum (06/10/2023)  Assessment/Plan Congestive Heart Failure (CHF) exacerbation Admitted for acute exacerbation of CHF, specifically HFpEF, following a fall. BNP elevated at 680, required oxygen supplementation and diuretics. Chest X-ray showed moderate CHF with new right-sided pleural effusion. Currently on a higher dose of Lasix, impacting kidney function, evidenced by elevated creatinine levels. Balancing heart failure symptoms with kidney function  preservation is crucial. - Continue diuretics for HFpEF exacerbation - Monitor kidney function closely - Adjust Lasix dosing to minimize kidney impact  Chronic Kidney Disease (CKD) Creatinine elevated at 1.7, possibly a new baseline for CKD. Kidney function impacted by diuretics for CHF management. Balancing heart failure treatment with kidney preservation is necessary. - Monitor creatinine levels - Adjust diuretic dosing as needed  Hypertension Moderately elevated blood pressure on multiple antihypertensive medications, including amlodipine, valsartan, metoprolol, and hydralazine. - Continue current antihypertensive regimen - Monitor blood pressure closely  Diabetes Mellitus Current hyperglycemia with CKD limits use of many oral anti-hyperglycemic medications, necessitating potential  insulin use. Discussed dietary modifications to control blood sugar, emphasizing reduction of sweets and carbohydrates. - Check glucose levels in the mornings - Consider starting insulin if glucose levels remain elevated - Encourage dietary modifications to reduce sweets and carbohydrates  Muscle spasms and leg pain Reports shooting pain from groin down the leg, exacerbated by movement. Likely due to muscle spasms, possibly from overuse during physical therapy. Discussed muscle relaxers, noting sedation risk which could increase fall risk. Emphasized careful management to avoid excessive sedation. - Start methocarbamol BID PRN for muscle spasms - Consider use of heating pads for pain relief  Breast Cancer (Estrogen Receptor Positive) Estrogen receptor-positive breast cancer, currently on anastrozole 1 mg daily as part of the treatment regimen. - Continue anastrozole 1 mg daily  Goals of Care Priorities include comfort, pain control, and maintaining independence. Discussed balancing pain management with risk of sedation and falls. Emphasized desire to walk independently and return home. - Focus on pain management while minimizing sedation - Encourage safe mobility practices  Follow-up Plans to follow up on kidney function, blood pressure management, and glucose levels. - Follow up with BMP in one week - Monitor glucose levels and adjust treatment as needed  Family/ staff Communication: nursing  Labs/tests ordered:  BMP   I spent greater than 45 minutes for the care of this patient in face to face time, chart review, clinical documentation, patient education.

## 2023-07-14 ENCOUNTER — Encounter: Payer: Self-pay | Admitting: Student

## 2023-07-14 ENCOUNTER — Non-Acute Institutional Stay (SKILLED_NURSING_FACILITY): Payer: Self-pay | Admitting: Student

## 2023-07-14 DIAGNOSIS — M62838 Other muscle spasm: Secondary | ICD-10-CM

## 2023-07-14 DIAGNOSIS — Z7189 Other specified counseling: Secondary | ICD-10-CM | POA: Diagnosis not present

## 2023-07-14 NOTE — Progress Notes (Unsigned)
 Location:  Other [place].  Nursing Home Room Number: [facility] [address]A Place of Service:  SNF [phone]) Provider:  Dr. Earnestine Mealing  PCP: [name], MD  Patient Care Team: [name], MD as PCP - General (Family Medicine) Earna Coder, MD as Consulting Physician (Internal Medicine) Lemar Livings Merrily Pew, MD as Consulting Physician (General Surgery) Scarlett Presto, RN (Inactive) as Oncology Nurse Navigator Carmina Miller, MD as Referring Physician (Radiation Oncology)  Extended Emergency Contact Information Primary Emergency Contact: [facility] Phone: [phone] Relation: Daughter Secondary Emergency Contact: Hicklin,Roxanne Mobile Phone: [phone] Relation: Niece  Code Status:  DNR Goals of care: Advanced Directive information    07/12/2023    9:59 AM  Advanced Directives  Does Patient Have a Medical Advance Directive? Yes  Type of Advance Directive Out of facility DNR (pink MOST or yellow form)  Does patient want to make changes to medical advance directive? No - Patient declined  Would patient like information on creating a medical advance directive? No - Patient declined  Pre-existing out of facility DNR order (yellow form or pink MOST form) Yellow form placed in chart (order not valid for inpatient use)     Chief Complaint  Patient presents with  . Spasms    Follow up Muscle Spasms.     HPI:  Pt is a 89 y.o. female seen today for Follow up Muscle Spasms.   Muscle spasms improved on current dosing of medication. Her daughter is present. Patient asks about disposition of whether or not she will be able to leave the facility. Discussed concern that she just returned from the hospital and at this time we cannot make the decision. As it stnds  Past Medical History:  Diagnosis Date  . Arthritis    Gout  . Chronic kidney disease   . Diarrhea   . Hypercholesteremia   . Hypertension   . Neuropathy    feet and lower legs  . Seasonal  allergies   . Skin cancer    face   Past Surgical History:  Procedure Laterality Date  . ABDOMINAL HYSTERECTOMY  2000  . APPENDECTOMY    . BREAST BIOPSY Right 09/19/2019   affirm bx of calcs UOQ, x marker, path pending  . BREAST BIOPSY Right 09/19/2019   [place] bx of mass,heart marker, path pending  . BREAST BIOPSY Right 09/19/2019   [place] bx of LN, coil marker, path pending  . CATARACT EXTRACTION Left   . CATARACT EXTRACTION W/PHACO Right 10/24/2014   Procedure: CATARACT EXTRACTION PHACO AND INTRAOCULAR LENS PLACEMENT (IOC);  Surgeon: Lockie Mola, MD;  Location: [facility];  Service: Ophthalmology;  Laterality: Right;  . ESOPHAGOGASTRODUODENOSCOPY N/A 03/07/2018   Procedure: ESOPHAGOGASTRODUODENOSCOPY (EGD);  Surgeon: Toney Reil, MD;  Location: [facility];  Service: Gastroenterology;  Laterality: N/A;  . MASTECTOMY MODIFIED RADICAL Right 10/13/2019   Procedure: MASTECTOMY MODIFIED RADICAL;  Surgeon: Earline Mayotte, MD;  Location: [facility];  Service: General;  Laterality: Right;  . RENAL ANGIOGRAPHY Right 04/11/2018   Procedure: RENAL ANGIOGRAPHY;  Surgeon: Annice Needy, MD;  Location: [facility] INVASIVE CV [facility];  Service: Cardiovascular;  Laterality: Right;  . SIMPLE MASTECTOMY WITH AXILLARY SENTINEL NODE BIOPSY Left 10/13/2019   Procedure: SIMPLE MASTECTOMY TRUE CUT BIOPSY, SENTINEL NODE BIOPSY;  Surgeon: Earline Mayotte, MD;  Location: [facility];  Service: General;  Laterality: Left;    Allergies  Allergen Reactions  . Penicillins Anaphylaxis    Patient tolerates ceftriaxone  . Drug Ingredient [Zinc]   .  Empagliflozin Rash    Outpatient Encounter Medications as of 07/14/2023  Medication Sig  . acetaminophen (TYLENOL) 325 MG tablet Take 1-2 tablets (325-650 mg total) by mouth every 6 (six) hours as needed for mild pain (pain score 1-3), moderate pain (pain score 4-6), fever or headache.  . allopurinol (ZYLOPRIM) 100 MG tablet Take 100 mg by mouth daily.  Marland Kitchen  amLODipine (NORVASC) 10 MG tablet Take 10 mg by mouth at bedtime.  Marland Kitchen anastrozole (ARIMIDEX) 1 MG tablet Take 1 tablet (1 mg total) by mouth daily.  . calcium carbonate (OS-CAL - DOSED IN MG OF ELEMENTAL CALCIUM) 1250 (500 Ca) MG tablet Take 2 tablets by mouth daily with breakfast.  . citalopram (CELEXA) 10 MG tablet Take 10 mg by mouth daily.   . cyanocobalamin (VITAMIN B12) 1000 MCG tablet Take 1,000 mcg by mouth daily.  . diclofenac Sodium (VOLTAREN) 1 % GEL Apply 2 g topically 2 (two) times daily. To bilateral legs.  . docusate sodium (COLACE) 100 MG capsule Take 100 mg by mouth daily as needed for mild constipation.  . DULoxetine (CYMBALTA) 60 MG capsule Take 60 mg by mouth daily.  . furosemide (LASIX) 40 MG tablet Take 0.5 tablets (20 mg total) by mouth daily. Increase to 1 tablet (40 mg total) by mouth daily (total daily dose 40 mg) as needed for up to 3 days for increased leg swelling, shortness of breath, weight gain 5+ lbs over 1-2 days. Seek medical advice if these symptoms are not improving with increased dose.  . gabapentin (NEURONTIN) 300 MG capsule Take 300 mg by mouth 2 (two) times daily.  Marland Kitchen guaifenesin (ROBITUSSIN) 100 MG/5ML syrup Take 200 mg by mouth every 4 (four) hours as needed for cough.  . hydrALAZINE (APRESOLINE) 50 MG tablet Take 1 tablet (50 mg total) by mouth 3 (three) times daily. Skip the dose if SBP less than 130 mmHg  . loratadine (CLARITIN) 10 MG tablet Take 10 mg by mouth daily as needed for allergies. AM  . melatonin 3 MG TABS tablet Take 3 mg by mouth at bedtime.  . methocarbamol (ROBAXIN) 500 MG tablet Take 500 mg by mouth every 12 (twelve) hours as needed for muscle spasms.  . metoprolol succinate (TOPROL-XL) 25 MG 24 hr tablet Take 1 tablet (25 mg total) by mouth daily.  . OXYGEN Inhale into the lungs. 2lpm  . polyethylene glycol (MIRALAX / GLYCOLAX) 17 g packet Take 17 g by mouth daily. Skip the dose if no constipation  . SANTYL 250 UNIT/GM ointment Apply 1  Application topically daily. apply to left medial cuneiform every day  . senna (SENOKOT) 8.6 MG TABS tablet Take 1 tablet by mouth every other day.  . Simethicone (GAS-X PO) Take 1 capsule by mouth every 8 (eight) hours as needed (for gas).  . simvastatin (ZOCOR) 20 MG tablet Take 20 mg by mouth every evening. PM  . sodium bicarbonate 650 MG tablet Take 650 mg by mouth every 8 (eight) hours as needed for heartburn.  . valsartan (DIOVAN) 160 MG tablet Take 160 mg by mouth daily.  . Vitamin D, Ergocalciferol, (DRISDOL) 1.25 MG (50000 UNIT) CAPS capsule Take 50,000 Units by mouth every 7 (seven) days.  . Wheat Dextrin (BENEFIBER ON THE GO) PACK Take 1 packet by mouth daily as needed.   No facility-administered encounter medications on file as of 07/14/2023.    Review of Systems  Immunization History  Administered Date(s) Administered  . Influenza Inj Mdck Quad Pf 03/26/2016, 02/14/2018, 02/15/2019,  02/25/2021, 03/02/2022  . Influenza Split 02/20/2015  . Influenza, Seasonal, Injecte, Preservative Fre 04/13/2012, 03/17/2013  . Influenza,inj,Quad PF,6+ Mos 03/07/2014, 02/20/2020  . Influenza-Unspecified 03/26/2016, 02/04/2017, 02/14/2018, 02/25/2021, 04/12/2023  . Moderna Sars-Covid-2 Vaccination 05/24/2019, 06/24/2019, 02/22/2020, 02/26/2022  . PNEUMOCOCCAL CONJUGATE-20 04/09/2023  . Pneumococcal Conjugate-13 09/01/2013  . Pneumococcal Polysaccharide-23 04/13/2012  . RSV,unspecified 04/09/2023  . Td 08/07/2010  . Tdap 08/07/2010  . Zoster, Live 09/12/2007   Pertinent  Health Maintenance Due  Topic Date Due  . FOOT EXAM  Never done  . OPHTHALMOLOGY EXAM  Never done  . HEMOGLOBIN A1C  12/19/2023  . INFLUENZA VACCINE  Completed  . DEXA SCAN  Completed      01/09/2022   10:55 AM 02/18/2022    1:36 PM 03/18/2022    4:19 PM 03/18/2022    4:46 PM 05/13/2022   10:17 AM  Fall Risk  (RETIRED) Patient Fall Risk Level High fall risk High fall risk Moderate fall risk Moderate fall risk High fall  risk   Functional Status Survey:    Vitals:   07/14/23 0933  BP: 138/76  Pulse: 82  Resp: 18  Temp: 97.6 F (36.4 C)  SpO2: 98%  Weight: 153 lb 3.2 oz (69.5 kg)  Height: 5\' 2"  (1.575 m)   Body mass index is 28.02 kg/m. Physical Exam  Labs reviewed: Recent Labs    04/01/23 0519 04/12/23 0000 05/08/23 0402 05/09/23 0436 05/10/23 0210 05/11/23 0515 07/01/23 0510 07/02/23 0504 07/03/23 0454 07/05/23 0000 07/08/23 0000  NA 138   < > 136 135 133*   < > 141 139 140 140 142  K 4.4   < > 3.7 3.8 4.0   < > 3.5 3.6 3.7 4.1 4.1  CL 107   < > 108 106 105   < > 105 105 105 105 105  CO2 21*   < > 19* 18* 19*   < > 25 25 25  29* 26*  GLUCOSE 156*   < > 136* 119* 137*   < > 141* 115* 113*  --   --   BUN 39*   < > 31* 34* 39*   < > 27* 27* 28* 24* 33*  CREATININE 1.58*   < > 1.31* 1.47* 1.56*   < > 1.74* 1.58* 1.56* 1.6* 1.7*  CALCIUM 8.5*   < > 8.9 8.8* 8.6*   < > 9.0 9.1 9.5 9.7 9.3  MG 2.4  --  1.7 2.0  --   --   --   --   --   --   --   PHOS 3.0  --  3.3 3.4 2.9  --   --   --   --   --   --    < > = values in this interval not displayed.   Recent Labs    02/22/23 1444 05/08/23 0402 05/10/23 0210 05/27/23 0909 06/29/23 0823  AST 24  --   --  23 21  ALT 14  --   --  15 11  ALKPHOS 62  --   --  66 61  BILITOT 0.4  --   --  0.6 0.7  PROT 7.5  --   --  7.1 7.0  ALBUMIN 4.1   < > 3.5 3.7 3.5   < > = values in this interval not displayed.   Recent Labs    06/21/23 0000 06/29/23 0823 06/30/23 0452 07/01/23 0510 07/08/23 0000  WBC 7.1  7.1 10.4 7.8 7.1 7.2  NEUTROABS 3,862.00 6.9  --   --  3,262.00  HGB 10.2* 10.0* 9.4* 9.6* 9.8*  HCT 31* 29.8* 28.2* 28.4* 31*  MCV  --  92.3 91.3 91.0  --   PLT 246 234 235 233 253   Lab Results  Component Value Date   TSH 2.573 08/26/2020   Lab Results  Component Value Date   HGBA1C 7.7 06/21/2023   Lab Results  Component Value Date   CHOL 75 08/27/2022   HDL 22 (L) 08/27/2022   LDLCALC 34 08/27/2022   TRIG 146  05/08/2023   CHOLHDL 3.4 08/27/2022    Significant Diagnostic Results in last 30 days:  CT Angio Chest PE W and/or Wo Contrast Result Date: 06/29/2023 CLINICAL DATA:  Shortness of breath and hypoxia. Concern for pulmonary embolism. Recent fall. EXAM: CT ANGIOGRAPHY CHEST WITH CONTRAST TECHNIQUE: Multidetector CT imaging of the chest was performed using the standard protocol during bolus administration of intravenous contrast. Multiplanar CT image reconstructions and MIPs were obtained to evaluate the vascular anatomy. RADIATION DOSE REDUCTION: This exam was performed according to the departmental dose-optimization program which includes automated exposure control, adjustment of the mA and/or kV according to patient size and/or use of iterative reconstruction technique. CONTRAST:  50mL OMNIPAQUE IOHEXOL 350 MG/ML SOLN COMPARISON:  Chest CT dated 03/29/2023. Radiograph dated 06/29/2023. FINDINGS: Cardiovascular: Mild cardiomegaly. No pericardial effusion. There is coronary vascular calcification. Moderate calcified and noncalcified plaque of the thoracic aorta. No aneurysmal dilatation or dissection. The origins of the great vessels of the aortic arch appear patent. Evaluation of the pulmonary arteries is limited due to respiratory motion. No pulmonary artery embolus identified. Mediastinum/Nodes: No hilar or mediastinal adenopathy. The esophagus is grossly unremarkable. No mediastinal fluid collection. Lungs/Pleura: Small left and moderate right pleural effusions with partial compressive atelectasis of the lower lobes. There is diffuse interstitial and interlobular septal prominence consistent with edema. There is no pneumothorax. The central airways are patent. Upper Abdomen: Indeterminate cystic pancreatic lesions as seen on the prior CT and better evaluated on the CT abdomen pelvis of 08/22/2022. Musculoskeletal: Osteopenia with degenerative changes of the spine. No acute osseous pathology. There is  stranding and edema of the chest wall. Review of the MIP images confirms the above findings. IMPRESSION: 1. No CT evidence of pulmonary artery embolus. 2. Mild cardiomegaly with pulmonary edema and bilateral pleural effusions, right greater than left. 3. Bilateral lower lobe atelectasis or pneumonia. 4.  Aortic Atherosclerosis (ICD10-I70.0). Electronically Signed   By: Elgie Collard M.D.   On: 06/29/2023 12:17   DG Hip Unilat W or Wo Pelvis 2-3 Views Left Result Date: 06/29/2023 CLINICAL DATA:  Left hip pain after a fall a few days ago EXAM: DG HIP (WITH OR WITHOUT PELVIS) 2-3V LEFT COMPARISON:  05/06/2023 FINDINGS: AP view of the pelvis and AP/frog leg views of the left hip. Femoral heads are located. Degenerative sclerosis of the left sacroiliac joint. No acute fracture. IMPRESSION: No acute osseous abnormality. Electronically Signed   By: Jeronimo Greaves M.D.   On: 06/29/2023 10:26   DG Chest Port 1 View Result Date: 06/29/2023 CLINICAL DATA:  Short of breath and hypoxic. EXAM: PORTABLE CHEST 1 VIEW COMPARISON:  08/26/2022 FINDINGS: Surgical clips project over the right axilla. Patient is rotated to the right. Cardiomegaly accentuated by AP portable technique. Atherosclerosis in the transverse aorta. Probable small right pleural effusion. No pneumothorax. Progressive interstitial prominence and indistinctness. Similar left and increased right base airspace disease. IMPRESSION: Moderate congestive heart failure. New or  increased small right pleural effusion. Persistent left and increased right lower lung airspace disease. Most likely atelectasis. Cannot exclude concurrent pneumonia or aspiration. Electronically Signed   By: Jeronimo Greaves M.D.   On: 06/29/2023 10:23    Assessment/Plan There are no diagnoses linked to this encounter.   Family/ staff Communication: ***  Labs/tests ordered:  ***

## 2023-07-16 ENCOUNTER — Ambulatory Visit (INDEPENDENT_AMBULATORY_CARE_PROVIDER_SITE_OTHER): Payer: Medicare PPO

## 2023-07-16 ENCOUNTER — Ambulatory Visit (INDEPENDENT_AMBULATORY_CARE_PROVIDER_SITE_OTHER): Payer: Medicare PPO | Admitting: Vascular Surgery

## 2023-07-16 DIAGNOSIS — I701 Atherosclerosis of renal artery: Secondary | ICD-10-CM

## 2023-07-19 LAB — BASIC METABOLIC PANEL
BUN: 47 — AB (ref 4–21)
CO2: 27 — AB (ref 13–22)
Chloride: 101 (ref 99–108)
Creatinine: 1.9 — AB (ref 0.5–1.1)
Glucose: 133
Potassium: 4 meq/L (ref 3.5–5.1)
Sodium: 136 — AB (ref 137–147)

## 2023-07-19 LAB — COMPREHENSIVE METABOLIC PANEL
Calcium: 9.9 (ref 8.7–10.7)
eGFR: 24

## 2023-07-20 ENCOUNTER — Encounter: Payer: Self-pay | Admitting: Nurse Practitioner

## 2023-07-20 ENCOUNTER — Non-Acute Institutional Stay (SKILLED_NURSING_FACILITY): Payer: Self-pay | Admitting: Nurse Practitioner

## 2023-07-20 DIAGNOSIS — M4727 Other spondylosis with radiculopathy, lumbosacral region: Secondary | ICD-10-CM

## 2023-07-20 NOTE — Progress Notes (Unsigned)
 Location:  Other [place].  Nursing Home Room Number: [facility] 505A Place of Service:  SNF ([id number]) Abbey Chatters, NP  PCP: [name], MD  Patient Care Team: [name], MD as PCP - General (Family Medicine) Earna Coder, MD as Consulting Physician (Internal Medicine) Lemar Livings Merrily Pew, MD as Consulting Physician (General Surgery) Scarlett Presto, RN (Inactive) as Oncology Nurse Navigator Carmina Miller, MD as Referring Physician (Radiation Oncology)  Extended Emergency Contact Information Primary Emergency Contact: [facility] Phone: [phone] Relation: Daughter Secondary Emergency Contact: Hicklin,Roxanne Mobile Phone: [phone] Relation: Niece  Goals of care: Advanced Directive information    07/12/2023    9:59 AM  Advanced Directives  Does Patient Have a Medical Advance Directive? Yes  Type of Advance Directive Out of facility DNR (pink MOST or yellow form)  Does patient want to make changes to medical advance directive? No - Patient declined  Would patient like information on creating a medical advance directive? No - Patient declined  Pre-existing out of facility DNR order (yellow form or pink MOST form) Yellow form placed in chart (order not valid for inpatient use)     Chief Complaint  Patient presents with   Leg Pain    Leg Pain.     HPI:  Pt is a 89 y.o. female seen today for an acute visit for Leg Pain. Pt has been having significant left left pain. Starting from back going around to hip and down leg. Reports as a grabbing, tight, sharp pain. She has been on robaxin twice daily scheduled which has helped but pain is still 8/10.  Effecting her sleep and unable to walk due to pain limiting her PT.    Past Medical History:  Diagnosis Date   Arthritis    Gout   Chronic kidney disease    Diarrhea    Hypercholesteremia    Hypertension    Neuropathy    feet and lower legs   Seasonal allergies    Skin cancer    face    Past Surgical History:  Procedure Laterality Date   ABDOMINAL HYSTERECTOMY  2000   APPENDECTOMY     BREAST BIOPSY Right 09/19/2019   affirm bx of calcs UOQ, x marker, path pending   BREAST BIOPSY Right 09/19/2019   [place] bx of mass,heart marker, path pending   BREAST BIOPSY Right 09/19/2019   [place] bx of LN, coil marker, path pending   CATARACT EXTRACTION Left    CATARACT EXTRACTION W/PHACO Right 10/24/2014   Procedure: CATARACT EXTRACTION PHACO AND INTRAOCULAR LENS PLACEMENT (IOC);  Surgeon: Lockie Mola, MD;  [facility];  Service: Ophthalmology;  Laterality: Right;   ESOPHAGOGASTRODUODENOSCOPY N/A 03/07/2018   Procedure: ESOPHAGOGASTRODUODENOSCOPY (EGD);  Surgeon: Toney Reil, MD;  Location: [facility] ENDOSCOPY;  Service: Gastroenterology;  Laterality: N/A;   MASTECTOMY MODIFIED RADICAL Right 10/13/2019   Procedure: MASTECTOMY MODIFIED RADICAL;  Surgeon: Earline Mayotte, MD;  Location: [facility] ORS;  Service: General;  Laterality: Right;   RENAL ANGIOGRAPHY Right 04/11/2018   Procedure: RENAL ANGIOGRAPHY;  Surgeon: Annice Needy, MD;  Location: [facility] INVASIVE CV LAB;  Service: Cardiovascular;  Laterality: Right;   SIMPLE MASTECTOMY WITH AXILLARY SENTINEL NODE BIOPSY Left 10/13/2019   Procedure: SIMPLE MASTECTOMY TRUE CUT BIOPSY, SENTINEL NODE BIOPSY;  Surgeon: Earline Mayotte, MD;  Location: [facility] ORS;  Service: General;  Laterality: Left;    Allergies  Allergen Reactions   Penicillins Anaphylaxis    Patient tolerates ceftriaxone   Drug Ingredient [Zinc]  Empagliflozin Rash    Outpatient Encounter Medications as of 07/20/2023  Medication Sig   acetaminophen (TYLENOL) 325 MG tablet Take 1-2 tablets (325-650 mg total) by mouth every 6 (six) hours as needed for mild pain (pain score 1-3), moderate pain (pain score 4-6), fever or headache.   allopurinol (ZYLOPRIM) 100 MG tablet Take 100 mg by mouth daily.   amLODipine (NORVASC) 10 MG tablet Take 10 mg by mouth at  bedtime.   anastrozole (ARIMIDEX) 1 MG tablet Take 1 tablet (1 mg total) by mouth daily.   calcium carbonate (OS-CAL - DOSED IN MG OF ELEMENTAL CALCIUM) 1250 (500 Ca) MG tablet Take 2 tablets by mouth daily with breakfast.   citalopram (CELEXA) 10 MG tablet Take 10 mg by mouth daily.    cyanocobalamin (VITAMIN B12) 1000 MCG tablet Take 1,000 mcg by mouth daily.   diclofenac Sodium (VOLTAREN) 1 % GEL Apply 2 g topically 2 (two) times daily. To bilateral legs.   docusate sodium (COLACE) 100 MG capsule Take 100 mg by mouth daily as needed for mild constipation.   DULoxetine (CYMBALTA) 60 MG capsule Take 60 mg by mouth daily.   furosemide (LASIX) 40 MG tablet Take 0.5 tablets (20 mg total) by mouth daily. Increase to 1 tablet (40 mg total) by mouth daily (total daily dose 40 mg) as needed for up to 3 days for increased leg swelling, shortness of breath, weight gain 5+ lbs over 1-2 days. Seek medical advice if these symptoms are not improving with increased dose. (Patient taking differently: Take 20 mg by mouth daily. Give 40mg  by mouth once time daily. Increase to 1 tablet (40 mg total) by mouth daily (total daily dose 40 mg) as needed for up to 3 days for increased leg swelling, shortness of breath, weight gain 5+ lbs over 1-2 days. Seek medical advice if these symptoms are not improving with increased dose.)   gabapentin (NEURONTIN) 300 MG capsule Take 300 mg by mouth 2 (two) times daily.   guaifenesin (ROBITUSSIN) 100 MG/5ML syrup Take 200 mg by mouth every 4 (four) hours as needed for cough.   hydrALAZINE (APRESOLINE) 50 MG tablet Take 1 tablet (50 mg total) by mouth 3 (three) times daily. Skip the dose if SBP less than 130 mmHg   loratadine (CLARITIN) 10 MG tablet Take 10 mg by mouth daily as needed for allergies. AM   melatonin 3 MG TABS tablet Take 3 mg by mouth at bedtime.   methocarbamol (ROBAXIN) 500 MG tablet Take 500 mg by mouth every 8 (eight) hours as needed for muscle spasms.   metoprolol  succinate (TOPROL-XL) 25 MG 24 hr tablet Take 1 tablet (25 mg total) by mouth daily.   OXYGEN Inhale into the lungs. 2lpm   polyethylene glycol (MIRALAX / GLYCOLAX) 17 g packet Take 17 g by mouth daily. Skip the dose if no constipation   predniSONE (DELTASONE) 20 MG tablet Take 20 mg by mouth daily with breakfast.   SANTYL 250 UNIT/GM ointment Apply 1 Application topically daily. apply to left medial cuneiform every day   senna (SENOKOT) 8.6 MG TABS tablet Take 1 tablet by mouth every other day.   Simethicone (GAS-X PO) Take 1 capsule by mouth every 8 (eight) hours as needed (for gas).   simvastatin (ZOCOR) 20 MG tablet Take 20 mg by mouth every evening. PM   sodium bicarbonate 650 MG tablet Take 650 mg by mouth every 8 (eight) hours as needed for heartburn.   valsartan (DIOVAN) 160 MG  tablet Take 160 mg by mouth daily.   Vitamin D, Ergocalciferol, (DRISDOL) 1.25 MG (50000 UNIT) CAPS capsule Take 50,000 Units by mouth every 7 (seven) days.   Wheat Dextrin (BENEFIBER ON THE GO) PACK Take 1 packet by mouth daily as needed.   No facility-administered encounter medications on file as of 07/20/2023.    Review of Systems  Constitutional:  Positive for activity change.  Musculoskeletal:  Positive for arthralgias, back pain, gait problem and myalgias.    Immunization History  Administered Date(s) Administered   Influenza Inj Mdck Quad Pf 03/26/2016, 02/14/2018, 02/15/2019, 02/25/2021, 03/02/2022   Influenza Split 02/20/2015   Influenza, Seasonal, Injecte, Preservative Fre 04/13/2012, 03/17/2013   Influenza,inj,Quad PF,6+ Mos 03/07/2014, 02/20/2020   Influenza-Unspecified 03/26/2016, 02/04/2017, 02/14/2018, 02/25/2021, 04/12/2023   Moderna Sars-Covid-2 Vaccination 05/24/2019, 06/24/2019, 02/22/2020, 02/26/2022   PNEUMOCOCCAL CONJUGATE-20 04/09/2023   Pneumococcal Conjugate-13 09/01/2013   Pneumococcal Polysaccharide-23 04/13/2012   RSV,unspecified 04/09/2023   Td 08/07/2010   Tdap 08/07/2010    Zoster, Live 09/12/2007   Pertinent  Health Maintenance Due  Topic Date Due   FOOT EXAM  Never done   OPHTHALMOLOGY EXAM  Never done   HEMOGLOBIN A1C  12/19/2023   INFLUENZA VACCINE  Completed   DEXA SCAN  Completed      01/09/2022   10:55 AM 02/18/2022    1:36 PM 03/18/2022    4:19 PM 03/18/2022    4:46 PM 05/13/2022   10:17 AM  Fall Risk  (RETIRED) Patient Fall Risk Level High fall risk High fall risk Moderate fall risk Moderate fall risk High fall risk   Functional Status Survey:    Vitals:   07/20/23 1005 07/20/23 1032  BP: (!) 146/77 133/63  Pulse: 86   Resp: 16   Temp: 98.9 F (37.2 C)   SpO2: 96%   Weight: 150 lb 12.8 oz (68.4 kg)   Height: 5\' 2"  (1.575 m)    Body mass index is 27.58 kg/m. Physical Exam Constitutional:      General: She is not in acute distress.    Appearance: She is well-developed. She is not diaphoretic.  HENT:     Head: Normocephalic and atraumatic.     Mouth/Throat:     Pharynx: No oropharyngeal exudate.  Eyes:     Conjunctiva/sclera: Conjunctivae normal.     Pupils: Pupils are equal, round, and reactive to light.  Cardiovascular:     Rate and Rhythm: Normal rate and regular rhythm.     Heart sounds: Normal heart sounds.  Pulmonary:     Effort: Pulmonary effort is normal.     Breath sounds: Normal breath sounds.  Abdominal:     General: Bowel sounds are normal.     Palpations: Abdomen is soft.  Musculoskeletal:     Cervical back: Normal range of motion and neck supple.     Right lower leg: No edema.     Left lower leg: No edema.  Skin:    General: Skin is warm and dry.  Neurological:     Mental Status: She is alert.     Motor: Weakness present.     Gait: Gait abnormal.  Psychiatric:        Mood and Affect: Mood normal.     Labs reviewed: Recent Labs    04/01/23 0519 04/12/23 0000 05/08/23 0402 05/09/23 0436 05/10/23 0210 05/11/23 0515 07/01/23 0510 07/02/23 0504 07/03/23 0454 07/05/23 0000 07/08/23 0000  07/19/23 0000  NA 138   < > 136 135 133*   < >  141 139 140 140 142 136*  K 4.4   < > 3.7 3.8 4.0   < > 3.5 3.6 3.7 4.1 4.1 4.0  CL 107   < > 108 106 105   < > 105 105 105 105 105 101  CO2 21*   < > 19* 18* 19*   < > 25 25 25  29* 26* 27*  GLUCOSE 156*   < > 136* 119* 137*   < > 141* 115* 113*  --   --   --   BUN 39*   < > 31* 34* 39*   < > 27* 27* 28* 24* 33* 47*  CREATININE 1.58*   < > 1.31* 1.47* 1.56*   < > 1.74* 1.58* 1.56* 1.6* 1.7* 1.9*  CALCIUM 8.5*   < > 8.9 8.8* 8.6*   < > 9.0 9.1 9.5 9.7 9.3 9.9  MG 2.4  --  1.7 2.0  --   --   --   --   --   --   --   --   PHOS 3.0  --  3.3 3.4 2.9  --   --   --   --   --   --   --    < > = values in this interval not displayed.   Recent Labs    02/22/23 1444 05/08/23 0402 05/10/23 0210 05/27/23 0909 06/29/23 0823  AST 24  --   --  23 21  ALT 14  --   --  15 11  ALKPHOS 62  --   --  66 61  BILITOT 0.4  --   --  0.6 0.7  PROT 7.5  --   --  7.1 7.0  ALBUMIN 4.1   < > 3.5 3.7 3.5   < > = values in this interval not displayed.   Recent Labs    06/21/23 0000 06/29/23 0823 06/30/23 0452 07/01/23 0510 07/08/23 0000  WBC 7.1  7.1 10.4 7.8 7.1 7.2  NEUTROABS 3,862.00 6.9  --   --  3,262.00  HGB 10.2* 10.0* 9.4* 9.6* 9.8*  HCT 31* 29.8* 28.2* 28.4* 31*  MCV  --  92.3 91.3 91.0  --   PLT 246 234 235 233 253   Lab Results  Component Value Date   TSH 2.573 08/26/2020   Lab Results  Component Value Date   HGBA1C 7.7 06/21/2023   Lab Results  Component Value Date   CHOL 75 08/27/2022   HDL 22 (L) 08/27/2022   LDLCALC 34 08/27/2022   TRIG 146 05/08/2023   CHOLHDL 3.4 08/27/2022    Significant Diagnostic Results in last 30 days:  VAS US RENAL ARTERY DUPLEX Result Date: 07/19/2023 ABDOMINAL VISCERAL Patient Name:  ANTANASIA KACZYNSKI  Date of Exam:   07/16/2023 Medical Rec #: [id number]      Accession #:    [id number] Date of Birth: 12-25-1926     Patient Gender: F Patient Age:   38 years Exam Location:  [facility]  Procedure:      VAS US RENAL ARTERY DUPLEX Referring Phys: [id number] Marlow Baars DEW -------------------------------------------------------------------------------- Indications: Known AAA, Right renal artery stent High Risk Factors: Hypertension, Diabetes, no history of smoking, prior MI,                    prior CVA. Other Factors: Known atrophic left kidney. Vascular Interventions: 04/11/2018 Right RA stent placed. Performing Technologist: Hardie Lora RVT  Examination Guidelines: A  complete evaluation includes B-mode imaging, spectral Doppler, color Doppler, and power Doppler as needed of all accessible portions of each vessel. Bilateral testing is considered an integral part of a complete examination. Limited examinations for reoccurring indications Ariadne Rissmiller be performed as noted.  Duplex Findings: +------------+--------+--------+------+--------------+ Mesenteric  PSV cm/sEDV cm/sPlaque   Comments    +------------+--------+--------+------+--------------+ Aorta Prox    121                 2.34 x 2.46 cm +------------+--------+--------+------+--------------+ Aorta Mid     100                 3.79 x 3.44 cm +------------+--------+--------+------+--------------+ Aorta Distal  112                 1.83 x 1.74 cm +------------+--------+--------+------+--------------+    +------------------+--------+--------+-------+ Right Renal ArteryPSV cm/sEDV cm/sComment +------------------+--------+--------+-------+ Origin              250      89    stent  +------------------+--------+--------+-------+ Proximal            259      64           +------------------+--------+--------+-------+ Mid                  82      16           +------------------+--------+--------+-------+ Distal               77      18           +------------------+--------+--------+-------+ +------------+--------+--------+----+-----------+--------+--------+---+ Right KidneyPSV cm/sEDV cm/sRI  Left KidneyPSV  cm/sEDV cm/sRI  +------------+--------+--------+----+-----------+--------+--------+---+ Upper Pole                      Upper Pole                     +------------+--------+--------+----+-----------+--------+--------+---+ Mid         69      15      0.                            +------------+--------+--------+----+-----------+--------+--------+---+ Lower Pole                      Lower Pole                     +------------+--------+--------+----+-----------+--------+--------+---+ Hilar                           Hilar                          +------------+--------+--------+----+-----------+--------+--------+---+ +------------------+-------+------------------++ Right Kidney             Left Kidney        +------------------+-------+------------------++ RAR                      RAR                +------------------+-------+------------------++ RAR (manual)      2.14   RAR (manual)       +------------------+-------+------------------++ Cortex                   Cortex             +------------------+-------+------------------++ Cortex  thickness  1.25 mmCorex thickness    +------------------+-------+------------------++ Kidney length (cm)10.90  Kidney length (cm) +------------------+-------+------------------++  Summary: Renal:  Right: Normal size right kidney. Normal right Resistive Index. 1-59%        stenosis of the right renal artery. Mesenteric:  Prior AA max diameter 3.70 cm. No significant change today.  *See table(s) above for measurements and observations.  Diagnosing physician: Festus Barren MD  Electronically signed by Festus Barren MD on 07/19/2023 at 1:42:54 PM.    Final    CT Angio Chest PE W and/or Wo Contrast Result Date: 06/29/2023 CLINICAL DATA:  Shortness of breath and hypoxia. Concern for pulmonary embolism. Recent fall. EXAM: CT ANGIOGRAPHY CHEST WITH CONTRAST TECHNIQUE: Multidetector CT imaging of the chest was performed using  the standard protocol during bolus administration of intravenous contrast. Multiplanar CT image reconstructions and MIPs were obtained to evaluate the vascular anatomy. RADIATION DOSE REDUCTION: This exam was performed according to the departmental dose-optimization program which includes automated exposure control, adjustment of the mA and/or kV according to patient size and/or use of iterative reconstruction technique. CONTRAST:  50mL OMNIPAQUE IOHEXOL 350 MG/ML SOLN COMPARISON:  Chest CT dated 03/29/2023. Radiograph dated 06/29/2023. FINDINGS: Cardiovascular: Mild cardiomegaly. No pericardial effusion. There is coronary vascular calcification. Moderate calcified and noncalcified plaque of the thoracic aorta. No aneurysmal dilatation or dissection. The origins of the great vessels of the aortic arch appear patent. Evaluation of the pulmonary arteries is limited due to respiratory motion. No pulmonary artery embolus identified. Mediastinum/Nodes: No hilar or mediastinal adenopathy. The esophagus is grossly unremarkable. No mediastinal fluid collection. Lungs/Pleura: Small left and moderate right pleural effusions with partial compressive atelectasis of the lower lobes. There is diffuse interstitial and interlobular septal prominence consistent with edema. There is no pneumothorax. The central airways are patent. Upper Abdomen: Indeterminate cystic pancreatic lesions as seen on the prior CT and better evaluated on the CT abdomen pelvis of 08/22/2022. Musculoskeletal: Osteopenia with degenerative changes of the spine. No acute osseous pathology. There is stranding and edema of the chest wall. Review of the MIP images confirms the above findings. IMPRESSION: 1. No CT evidence of pulmonary artery embolus. 2. Mild cardiomegaly with pulmonary edema and bilateral pleural effusions, right greater than left. 3. Bilateral lower lobe atelectasis or pneumonia. 4.  Aortic Atherosclerosis (ICD10-I70.0). Electronically Signed    By: Elgie Collard M.D.   On: 06/29/2023 12:17   DG Hip Unilat W or Wo Pelvis 2-3 Views Left Result Date: 06/29/2023 CLINICAL DATA:  Left hip pain after a fall a few days ago EXAM: DG HIP (WITH OR WITHOUT PELVIS) 2-3V LEFT COMPARISON:  05/06/2023 FINDINGS: AP view of the pelvis and AP/frog leg views of the left hip. Femoral heads are located. Degenerative sclerosis of the left sacroiliac joint. No acute fracture. IMPRESSION: No acute osseous abnormality. Electronically Signed   By: Jeronimo Greaves M.D.   On: 06/29/2023 10:26   DG Chest Port 1 View Result Date: 06/29/2023 CLINICAL DATA:  Short of breath and hypoxic. EXAM: PORTABLE CHEST 1 VIEW COMPARISON:  08/26/2022 FINDINGS: Surgical clips project over the right axilla. Patient is rotated to the right. Cardiomegaly accentuated by AP portable technique. Atherosclerosis in the transverse aorta. Probable small right pleural effusion. No pneumothorax. Progressive interstitial prominence and indistinctness. Similar left and increased right base airspace disease. IMPRESSION: Moderate congestive heart failure. New or increased small right pleural effusion. Persistent left and increased right lower lung airspace disease. Most likely atelectasis. Cannot exclude concurrent pneumonia or aspiration. Electronically  Signed   By: Jeronimo Greaves M.D.   On: 06/29/2023 10:23    Assessment/Plan 1. Osteoarthritis of spine with radiculopathy, lumbosacral region (Primary) -OA of spine vs hip.  Will schedule robaxin every 8 hours for 5 days  -prednisone 20 mg BID for 3 days.  -continue PT as tolerates.  Notify for worsening pain or if no improvement with treatment.   Janene Harvey. Biagio Borg [facility] [phone]

## 2023-07-28 ENCOUNTER — Ambulatory Visit: Payer: Medicare PPO | Admitting: Podiatry

## 2023-08-06 ENCOUNTER — Non-Acute Institutional Stay (SKILLED_NURSING_FACILITY): Payer: Self-pay | Admitting: Student

## 2023-08-06 ENCOUNTER — Encounter: Payer: Self-pay | Admitting: Student

## 2023-08-06 DIAGNOSIS — E46 Unspecified protein-calorie malnutrition: Secondary | ICD-10-CM | POA: Diagnosis not present

## 2023-08-06 DIAGNOSIS — N1832 Chronic kidney disease, stage 3b: Secondary | ICD-10-CM

## 2023-08-06 DIAGNOSIS — E1122 Type 2 diabetes mellitus with diabetic chronic kidney disease: Secondary | ICD-10-CM | POA: Diagnosis not present

## 2023-08-06 DIAGNOSIS — L89892 Pressure ulcer of other site, stage 2: Secondary | ICD-10-CM | POA: Diagnosis not present

## 2023-08-06 DIAGNOSIS — I150 Renovascular hypertension: Secondary | ICD-10-CM

## 2023-08-06 DIAGNOSIS — I5031 Acute diastolic (congestive) heart failure: Secondary | ICD-10-CM | POA: Diagnosis not present

## 2023-08-06 NOTE — Progress Notes (Signed)
 Location:  Other [facility]) Nursing Home Room Number: 505 A Place of Service:  SNF [phone]) Provider:  Coralyn Helling, M.D.  Patient Care Team: [name], MD as PCP - General (Family Medicine) Earna Coder, MD as Consulting Physician (Internal Medicine) Lemar Livings Merrily Pew, MD as Consulting Physician (General Surgery) Scarlett Presto, RN (Inactive) as Oncology Nurse Navigator Carmina Miller, MD as Referring Physician (Radiation Oncology)  Extended Emergency Contact Information Primary Emergency Contact: [facility] : [facility] Phone: [phone] Relation: Daughter Secondary Emergency Contact: Hicklin,Roxanne Mobile Phone: [phone] Relation: Niece  Code Status:  DNR Goals of care: Advanced Directive information    07/12/2023    9:59 AM  Advanced Directives  Does Patient Have a Medical Advance Directive? Yes  Type of Advance Directive Out of facility DNR (pink MOST or yellow form)  Does patient want to make changes to medical advance directive? No - Patient declined  Would patient like information on creating a medical advance directive? No - Patient declined  Pre-existing out of facility DNR order (yellow form or pink MOST form) Yellow form placed in chart (order not valid for inpatient use)     Chief Complaint  Patient presents with   Wound Check    Follow-up on wound     HPI:  Pt is a 89 y.o. female seen today for an acute visit for wound follow up.  History of Present Illness The patient, with heart failure, presents with sore toes and a knee cut on the left foot.  She has soreness in her toes and a knee cut on the left foot, which is improving. She has been using a medication to break down slough, which has helped clear the yellow material, and the condition is getting better. Some soreness is attributed to using her toes when getting on her feet, and she has new shoes recommended by physical therapy.  She has been losing weight and attributes this  to trying to watch her sugar intake. [name] and has not been drinking protein shakes recently, although she did when she first arrived due to weight loss. She prefers chocolate-flavored protein shakes like Boost or Glycine. [name] about maintaining her weight and not losing muscle mass, especially given her heart failure history and the use of diuretics.  She mentions a tender spot on the bottom of her ribcage, present for at least three weeks. She wonders if it is due to pushing with her shoulder or the tightness of her pants' elastic. The tenderness is only present to the touch and does not affect her breathing.  Her breathing is fine and consistent, and she does not experience significant swelling in her legs currently. She notes that her left leg used to be much larger but is now even with the right leg.  She feels tired after physical therapy but does not feel she is giving in to the feeling too much. She is trying hard to walk and follow recommendations from physical therapy.  Past Medical History:  Diagnosis Date   Arthritis    Gout   Chronic kidney disease    Diarrhea    Hypercholesteremia    Hypertension    Neuropathy    feet and lower legs   Seasonal allergies    Skin cancer    face   Past Surgical History:  Procedure Laterality Date   ABDOMINAL HYSTERECTOMY  2000   APPENDECTOMY     BREAST BIOPSY Right 09/19/2019  affirm bx of calcs UOQ, x marker, path pending   BREAST BIOPSY Right 09/19/2019   [place] bx of mass,heart marker, path pending   BREAST BIOPSY Right 09/19/2019   [place] bx of LN, coil marker, path pending   CATARACT EXTRACTION Left    CATARACT EXTRACTION W/PHACO Right 10/24/2014   Procedure: CATARACT EXTRACTION PHACO AND INTRAOCULAR LENS PLACEMENT (IOC);  Surgeon: Lockie Mola, MD;  Location: [facility];  Service: Ophthalmology;  Laterality: Right;   ESOPHAGOGASTRODUODENOSCOPY N/A 03/07/2018   Procedure:  ESOPHAGOGASTRODUODENOSCOPY (EGD);  Surgeon: Toney Reil, MD;  Location: [facility] ENDOSCOPY;  Service: Gastroenterology;  Laterality: N/A;   MASTECTOMY MODIFIED RADICAL Right 10/13/2019   Procedure: MASTECTOMY MODIFIED RADICAL;  Surgeon: Earline Mayotte, MD;  Location: [facility] ORS;  Service: General;  Laterality: Right;   RENAL ANGIOGRAPHY Right 04/11/2018   Procedure: RENAL ANGIOGRAPHY;  Surgeon: Annice Needy, MD;  Location: [facility] INVASIVE CV LAB;  Service: Cardiovascular;  Laterality: Right;   SIMPLE MASTECTOMY WITH AXILLARY SENTINEL NODE BIOPSY Left 10/13/2019   Procedure: SIMPLE MASTECTOMY TRUE CUT BIOPSY, SENTINEL NODE BIOPSY;  Surgeon: Earline Mayotte, MD;  Location: [facility] ORS;  Service: General;  Laterality: Left;    Allergies  Allergen Reactions   Penicillins Anaphylaxis    Patient tolerates ceftriaxone   Drug Ingredient [Zinc]    Empagliflozin Rash    Outpatient Encounter Medications as of 08/06/2023  Medication Sig   allopurinol (ZYLOPRIM) 100 MG tablet Take 100 mg by mouth daily.   amLODipine (NORVASC) 10 MG tablet Take 10 mg by mouth at bedtime.   anastrozole (ARIMIDEX) 1 MG tablet Take 1 tablet (1 mg total) by mouth daily.   calcium carbonate (OS-CAL - DOSED IN MG OF ELEMENTAL CALCIUM) 1250 (500 Ca) MG tablet Take 2 tablets by mouth daily with breakfast.   citalopram (CELEXA) 10 MG tablet Take 10 mg by mouth daily.    diclofenac Sodium (VOLTAREN) 1 % GEL Apply 2 g topically 2 (two) times daily. To bilateral legs.   docusate sodium (COLACE) 100 MG capsule Take 100 mg by mouth daily as needed for mild constipation.   Nutritional Supplements (,FEEDING SUPPLEMENT, PROSOURCE PLUS) liquid Take 30 mLs by mouth 2 (two) times daily between meals. For wound healing   OXYGEN Inhale into the lungs. 2lpm   Wheat Dextrin (BENEFIBER ON THE GO) PACK Take 1 packet by mouth daily as needed.   acetaminophen (TYLENOL) 325 MG tablet Take 1-2 tablets (325-650 mg total) by mouth every 6 (six) hours  as needed for mild pain (pain score 1-3), moderate pain (pain score 4-6), fever or headache.   cyanocobalamin (VITAMIN B12) 1000 MCG tablet Take 1,000 mcg by mouth daily.   DULoxetine (CYMBALTA) 60 MG capsule Take 60 mg by mouth daily.   furosemide (LASIX) 40 MG tablet Take 0.5 tablets (20 mg total) by mouth daily. Increase to 1 tablet (40 mg total) by mouth daily (total daily dose 40 mg) as needed for up to 3 days for increased leg swelling, shortness of breath, weight gain 5+ lbs over 1-2 days. Seek medical advice if these symptoms are not improving with increased dose.   gabapentin (NEURONTIN) 300 MG capsule Take 300 mg by mouth 2 (two) times daily.   guaifenesin (ROBITUSSIN) 100 MG/5ML syrup Take 200 mg by mouth every 4 (four) hours as needed for cough.   hydrALAZINE (APRESOLINE) 50 MG tablet Take 1 tablet (50 mg total) by mouth 3 (three) times daily. Skip the dose if SBP less than 130  mmHg   loratadine (CLARITIN) 10 MG tablet Take 10 mg by mouth daily as needed for allergies. AM   melatonin 3 MG TABS tablet Take 3 mg by mouth at bedtime.   methocarbamol (ROBAXIN) 500 MG tablet Take 500 mg by mouth every 8 (eight) hours as needed for muscle spasms.   metoprolol succinate (TOPROL-XL) 25 MG 24 hr tablet Take 1 tablet (25 mg total) by mouth daily.   polyethylene glycol (MIRALAX / GLYCOLAX) 17 g packet Take 17 g by mouth daily. Skip the dose if no constipation   predniSONE (DELTASONE) 20 MG tablet Take 20 mg by mouth daily with breakfast. (Patient not taking: Reported on 08/06/2023)   SANTYL 250 UNIT/GM ointment Apply 1 Application topically daily. apply to left medial cuneiform every day (Patient not taking: Reported on 08/06/2023)   senna (SENOKOT) 8.6 MG TABS tablet Take 1 tablet by mouth every other day.   Simethicone (GAS-X PO) Take 1 capsule by mouth every 8 (eight) hours as needed (for gas).   simvastatin (ZOCOR) 20 MG tablet Take 20 mg by mouth every evening. PM   sodium bicarbonate 650 MG  tablet Take 650 mg by mouth every 8 (eight) hours as needed for heartburn.   valsartan (DIOVAN) 160 MG tablet Take 160 mg by mouth daily.   Vitamin D, Ergocalciferol, (DRISDOL) 1.25 MG (50000 UNIT) CAPS capsule Take 50,000 Units by mouth every 7 (seven) days.   No facility-administered encounter medications on file as of 08/06/2023.    Review of Systems  Immunization History  Administered Date(s) Administered   Influenza Inj Mdck Quad Pf 03/26/2016, 02/14/2018, 02/15/2019, 02/25/2021, 03/02/2022   Influenza Split 02/20/2015   Influenza, Seasonal, Injecte, Preservative Fre 04/13/2012, 03/17/2013   Influenza,inj,Quad PF,6+ Mos 03/07/2014, 02/20/2020   Influenza-Unspecified 03/26/2016, 02/04/2017, 02/14/2018, 02/25/2021, 04/12/2023   Moderna Sars-Covid-2 Vaccination 05/24/2019, 06/24/2019, 02/22/2020, 02/26/2022   PNEUMOCOCCAL CONJUGATE-20 04/09/2023   Pneumococcal Conjugate-13 09/01/2013   Pneumococcal Polysaccharide-23 04/13/2012   RSV,unspecified 04/09/2023   Td 08/07/2010   Tdap 08/07/2010   Zoster, Live 09/12/2007   Pertinent  Health Maintenance Due  Topic Date Due   FOOT EXAM  Never done   OPHTHALMOLOGY EXAM  Never done   HEMOGLOBIN A1C  12/19/2023   INFLUENZA VACCINE  Completed   DEXA SCAN  Completed      01/09/2022   10:55 AM 02/18/2022    1:36 PM 03/18/2022    4:19 PM 03/18/2022    4:46 PM 05/13/2022   10:17 AM  Fall Risk  (RETIRED) Patient Fall Risk Level High fall risk High fall risk Moderate fall risk Moderate fall risk High fall risk   Functional Status Survey:    Vitals:   08/06/23 0944  BP: (!) 156/71  Pulse: 71  Resp: 16  Temp: 97.6 F (36.4 C)  SpO2: 96%  Weight: 148 lb 3.2 oz (67.2 kg)  Height: 5\' 2"  (1.575 m)   Body mass index is 27.11 kg/m. Physical Exam Constitutional:      Comments: Frail, thin   Cardiovascular:     Rate and Rhythm: Normal rate. Rhythm irregular.     Pulses: Normal pulses.  Pulmonary:     Effort: Pulmonary effort is  normal.     Breath sounds: Normal breath sounds.  Abdominal:     General: Abdomen is flat.     Palpations: Abdomen is soft.  Skin:    General: Skin is warm and dry.  Neurological:     Mental Status: She is oriented to person, place,  and time.     Labs reviewed: Recent Labs    04/01/23 0519 04/12/23 0000 05/08/23 0402 05/09/23 0436 05/10/23 0210 05/11/23 0515 07/01/23 0510 07/02/23 0504 07/03/23 0454 07/05/23 0000 07/08/23 0000 07/19/23 0000  NA 138   < > 136 135 133*   < > 141 139 140 140 142 136*  K 4.4   < > 3.7 3.8 4.0   < > 3.5 3.6 3.7 4.1 4.1 4.0  CL 107   < > 108 106 105   < > 105 105 105 105 105 101  CO2 21*   < > 19* 18* 19*   < > 25 25 25  29* 26* 27*  GLUCOSE 156*   < > 136* 119* 137*   < > 141* 115* 113*  --   --   --   BUN 39*   < > 31* 34* 39*   < > 27* 27* 28* 24* 33* 47*  CREATININE 1.58*   < > 1.31* 1.47* 1.56*   < > 1.74* 1.58* 1.56* 1.6* 1.7* 1.9*  CALCIUM 8.5*   < > 8.9 8.8* 8.6*   < > 9.0 9.1 9.5 9.7 9.3 9.9  MG 2.4  --  1.7 2.0  --   --   --   --   --   --   --   --   PHOS 3.0  --  3.3 3.4 2.9  --   --   --   --   --   --   --    < > = values in this interval not displayed.   Recent Labs    02/22/23 1444 05/08/23 0402 05/10/23 0210 05/27/23 0909 06/29/23 0823  AST 24  --   --  23 21  ALT 14  --   --  15 11  ALKPHOS 62  --   --  66 61  BILITOT 0.4  --   --  0.6 0.7  PROT 7.5  --   --  7.1 7.0  ALBUMIN 4.1   < > 3.5 3.7 3.5   < > = values in this interval not displayed.   Recent Labs    06/21/23 0000 06/29/23 0823 06/30/23 0452 07/01/23 0510 07/08/23 0000  WBC 7.1  7.1 10.4 7.8 7.1 7.2  NEUTROABS 3,862.00 6.9  --   --  3,262.00  HGB 10.2* 10.0* 9.4* 9.6* 9.8*  HCT 31* 29.8* 28.2* 28.4* 31*  MCV  --  92.3 91.3 91.0  --   PLT 246 234 235 233 253   Lab Results  Component Value Date   TSH 2.573 08/26/2020   Lab Results  Component Value Date   HGBA1C 7.7 06/21/2023   Lab Results  Component Value Date   CHOL 75 08/27/2022    HDL 22 (L) 08/27/2022   LDLCALC 34 08/27/2022   TRIG 146 05/08/2023   CHOLHDL 3.4 08/27/2022    Significant Diagnostic Results in last 30 days:  VAS US RENAL ARTERY DUPLEX Result Date: 07/19/2023 ABDOMINAL VISCERAL Patient Name:  Tina Curtis  Date of Exam:   07/16/2023 Medical Rec #: [id number]      Accession #:    [id number] Date of Birth: 07/23/1926     Patient Gender: F Patient Age:   24 years Exam Location:  [facility] Procedure:      VAS US RENAL ARTERY DUPLEX Referring Phys: [id number] Marlow Baars DEW -------------------------------------------------------------------------------- Indications: Known AAA, Right renal artery stent High Risk Factors:  Hypertension, Diabetes, no history of smoking, prior MI,                    prior CVA. Other Factors: Known atrophic left kidney. Vascular Interventions: 04/11/2018 Right RA stent placed. Performing Technologist: Hardie Lora RVT  Examination Guidelines: A complete evaluation includes B-mode imaging, spectral Doppler, color Doppler, and power Doppler as needed of all accessible portions of each vessel. Bilateral testing is considered an integral part of a complete examination. Limited examinations for reoccurring indications may be performed as noted.  Duplex Findings: +------------+--------+--------+------+--------------+ Mesenteric  PSV cm/sEDV cm/sPlaque   Comments    +------------+--------+--------+------+--------------+ Aorta Prox    121                 2.34 x 2.46 cm +------------+--------+--------+------+--------------+ Aorta Mid     100                 3.79 x 3.44 cm +------------+--------+--------+------+--------------+ Aorta Distal  112                 1.83 x 1.74 cm +------------+--------+--------+------+--------------+    +------------------+--------+--------+-------+ Right Renal ArteryPSV cm/sEDV cm/sComment +------------------+--------+--------+-------+ Origin              250      89    stent   +------------------+--------+--------+-------+ Proximal            259      64           +------------------+--------+--------+-------+ Mid                  82      16           +------------------+--------+--------+-------+ Distal               77      18           +------------------+--------+--------+-------+ +------------+--------+--------+----+-----------+--------+--------+---+ Right KidneyPSV cm/sEDV cm/sRI  Left KidneyPSV cm/sEDV cm/sRI  +------------+--------+--------+----+-----------+--------+--------+---+ Upper Pole                      Upper Pole                     +------------+--------+--------+----+-----------+--------+--------+---+ Mid         69      15      0.                            +------------+--------+--------+----+-----------+--------+--------+---+ Lower Pole                      Lower Pole                     +------------+--------+--------+----+-----------+--------+--------+---+ Hilar                           Hilar                          +------------+--------+--------+----+-----------+--------+--------+---+ +------------------+-------+------------------++ Right Kidney             Left Kidney        +------------------+-------+------------------++ RAR                      RAR                +------------------+-------+------------------++ RAR (manual)  2.14   RAR (manual)       +------------------+-------+------------------++ Cortex                   Cortex             +------------------+-------+------------------++ Cortex thickness  1.25 mmCorex thickness    +------------------+-------+------------------++ Kidney length (cm)10.90  Kidney length (cm) +------------------+-------+------------------++  Summary: Renal:  Right: Normal size right kidney. Normal right Resistive Index. 1-59%        stenosis of the right renal artery. Mesenteric:  Prior AA max diameter 3.70 cm. No significant  change today.  *See table(s) above for measurements and observations.  Diagnosing physician: Festus Barren MD  Electronically signed by Festus Barren MD on 07/19/2023 at 1:42:54 PM.    Final     Assessment/Plan Foot ulcer, healing Chronic ulcer on the left foot, previously covered with slough, now improving with treatment. Soreness in toes likely due to increased activity and new shoes. No signs of infection or significant swelling. Motivated to walk and adhere to physical therapy recommendations. - Continue current topical treatment to promote healing. - Encourage use of appropriate footwear as recommended by physical therapy.  Heart failure, compensated Heart failure with recent exacerbation leading to hospitalization. Currently well-managed with no significant edema or respiratory distress. Diuretics are managing fluid status, contributing to weight loss. Balance between fluid removal and muscle mass preservation is crucial. - Continue diuretics to manage fluid status. - Monitor weight and adjust diuretics as needed to prevent excessive weight loss. - Encourage protein intake to maintain muscle mass and support healing.  Unintentional weight loss Weight loss possibly due to diuretic use and efforts to control blood sugar. Eating meals but not consuming protein shakes as before. Weight stability is a goal to prevent further muscle loss and support healing. Open to reintroducing protein shakes. - Reintroduce protein shakes between meals, either Boost or Glycine, to maintain weight and muscle mass. - Monitor weight regularly to ensure stability.  Hypertension BP slightly elevated. Currently taking amlodipine, lasix, hydralazine, metoprolol, valsartan, consider adding spironolactone as well given continued elevated blood pressure. Renal function continuing to have slow decline which could be contributing to the blood pressure. Goal is less than 140/90.   Rib tenderness Tenderness at the bottom of the  ribcage, possibly due to pressure from clothing or physical activity. No associated respiratory symptoms or other concerning features. Suspects tight clothing may contribute to discomfort. - Consider adjusting clothing to reduce pressure on the area.  Goals of Care Preferences for hospitalization in case of future illness discussed. Prefers to avoid hospitalization unless necessary, feeling that care is comparable in current setting. Decision to transfer to hospital will depend on the severity of the illness and condition at the time. - Respect wishes regarding hospitalization. - Evaluate necessity of hospital transfer on a case-by-case basis.  Family/ staff Communication: nursing  Labs/tests ordered:  none

## 2023-08-07 ENCOUNTER — Encounter: Payer: Self-pay | Admitting: Student

## 2023-08-10 ENCOUNTER — Encounter: Payer: Self-pay | Admitting: Nurse Practitioner

## 2023-08-10 ENCOUNTER — Non-Acute Institutional Stay (SKILLED_NURSING_FACILITY): Payer: Self-pay | Admitting: Nurse Practitioner

## 2023-08-10 DIAGNOSIS — E46 Unspecified protein-calorie malnutrition: Secondary | ICD-10-CM

## 2023-08-10 DIAGNOSIS — E1122 Type 2 diabetes mellitus with diabetic chronic kidney disease: Secondary | ICD-10-CM | POA: Diagnosis not present

## 2023-08-10 DIAGNOSIS — F331 Major depressive disorder, recurrent, moderate: Secondary | ICD-10-CM

## 2023-08-10 DIAGNOSIS — N1832 Chronic kidney disease, stage 3b: Secondary | ICD-10-CM

## 2023-08-10 DIAGNOSIS — M1030 Gout due to renal impairment, unspecified site: Secondary | ICD-10-CM

## 2023-08-10 DIAGNOSIS — I5031 Acute diastolic (congestive) heart failure: Secondary | ICD-10-CM | POA: Diagnosis not present

## 2023-08-10 DIAGNOSIS — M4727 Other spondylosis with radiculopathy, lumbosacral region: Secondary | ICD-10-CM

## 2023-08-10 DIAGNOSIS — Z853 Personal history of malignant neoplasm of breast: Secondary | ICD-10-CM

## 2023-08-10 NOTE — Progress Notes (Signed)
 Location:  [facility] Room Number: 505 A Place of Service:  SNF (31)  [name] Contes, NP    Patient Care Team: [name], MD as PCP - General (Family Medicine) Earna Coder, MD as Consulting Physician (Internal Medicine) Lemar Livings Merrily Pew, MD as Consulting Physician (General Surgery) Scarlett Presto, RN (Inactive) as Oncology Nurse Navigator Carmina Miller, MD as Referring Physician (Radiation Oncology)  Extended Emergency Contact Information Primary Emergency Contact: [facility] Phone: [phone] Relation: Daughter Secondary Emergency Contact: Hicklin,Roxanne Mobile Phone: [phone] Relation: Niece  Goals of care: Advanced Directive information    07/12/2023    9:59 AM  Advanced Directives  Does Patient Have a Medical Advance Directive? Yes  Type of Advance Directive Out of facility DNR (pink MOST or yellow form)  Does patient want to make changes to medical advance directive? No - Patient declined  Would patient like information on creating a medical advance directive? No - Patient declined  Pre-existing out of facility DNR order (yellow form or pink MOST form) Yellow form placed in chart (order not valid for inpatient use)     Chief Complaint  Patient presents with   Medical Management of Chronic Issues    Routine visit. Discuss need for shingrix, td/tdap, AWV, foot exam, and eye exam   Discussed the use of AI scribe software for clinical note transcription with the patient, who gave verbal consent to proceed.  HPI:  Pt is a 89 y.o. female seen today for medical management of chronic disease.   Her A1c was recently recorded at 7.7, slightly above the target of less than 7.5. She is actively working on reducing sugar intake to improve glycemic control. Fasting blood sugars avg ~150.   She has a history of congestive heart failure and was previously hospitalized due to worsening fluid and shortness of breath.  Currently, no issues with shortness of breath, fluid retention, or chest pain.  She experiences back and leg pain, described as 'splashy like pain' that occurs with movement of the leg. She is undergoing therapy, which she finds helpful.pain is not as intense.   Regarding her history of gout, she is on Allopurinol had gout in both feet many years ago. Reports toes have been very tender and painful. Having the sheet fall over them in bed is painful   She has a history of breast cancer and is on Arimidex. She is scheduled to see her oncologist in about two weeks.  She experiences some depression and anxiety, particularly due to recent life changes, including moving from her home and concerns about her daughter's health. She is on two medications for mood management.   No recent falls and her bowels are moving well. She wants to maintain independence despite being at high risk for falls.  Past Medical History:  Diagnosis Date   Arthritis    Gout   Chronic kidney disease    Diarrhea    Hypercholesteremia    Hypertension    Neuropathy    feet and lower legs   Seasonal allergies    Skin cancer    face   Past Surgical History:  Procedure Laterality Date   ABDOMINAL HYSTERECTOMY  2000   APPENDECTOMY     BREAST BIOPSY Right 09/19/2019   affirm bx of calcs UOQ, x marker, path pending   BREAST BIOPSY Right 09/19/2019   [place] bx of mass,heart marker, path pending   BREAST BIOPSY Right 09/19/2019   [place] bx of LN, coil marker,  path pending   CATARACT EXTRACTION Left    CATARACT EXTRACTION W/PHACO Right 10/24/2014   Procedure: CATARACT EXTRACTION PHACO AND INTRAOCULAR LENS PLACEMENT (IOC);  Surgeon: Lockie Mola, MD;  Location: [facility];  Service: Ophthalmology;  Laterality: Right;   ESOPHAGOGASTRODUODENOSCOPY N/A 03/07/2018   Procedure: ESOPHAGOGASTRODUODENOSCOPY (EGD);  Surgeon: Toney Reil, MD;  Location: [facility] END[facility]COPY;  Service: Gastroenterology;  Laterality: N/A;    MASTECTOMY MODIFIED RADICAL Right 10/13/2019   Procedure: MASTECTOMY MODIFIED RADICAL;  Surgeon: Earline Mayotte, MD;  Location: [facility] ORS;  Service: General;  Laterality: Right;   RENAL ANGIOGRAPHY Right 04/11/2018   Procedure: RENAL ANGIOGRAPHY;  Surgeon: Annice Needy, MD;  Location: [facility] INVASIVE CV LAB;  Service: Cardiovascular;  Laterality: Right;   SIMPLE MASTECTOMY WITH AXILLARY SENTINEL NODE BIOPSY Left 10/13/2019   Procedure: SIMPLE MASTECTOMY TRUE CUT BIOPSY, SENTINEL NODE BIOPSY;  Surgeon: Earline Mayotte, MD;  Location: [facility] ORS;  Service: General;  Laterality: Left;    Allergies  Allergen Reactions   Penicillins Anaphylaxis    Patient tolerates ceftriaxone   Drug Ingredient [Zinc]    Empagliflozin Rash    Outpatient Encounter Medications as of 08/10/2023  Medication Sig   acetaminophen (TYLENOL) 325 MG tablet Take 1-2 tablets (325-650 mg total) by mouth every 6 (six) hours as needed for mild pain (pain score 1-3), moderate pain (pain score 4-6), fever or headache.   allopurinol (ZYLOPRIM) 100 MG tablet Take 100 mg by mouth daily.   amLODipine (NORVASC) 10 MG tablet Take 10 mg by mouth at bedtime.   anastrozole (ARIMIDEX) 1 MG tablet Take 1 tablet (1 mg total) by mouth daily.   calcium carbonate (OS-CAL - DOSED IN MG OF ELEMENTAL CALCIUM) 1250 (500 Ca) MG tablet Take 2 tablets by mouth daily with breakfast.   citalopram (CELEXA) 10 MG tablet Take 10 mg by mouth daily.    cyanocobalamin (VITAMIN B12) 1000 MCG tablet Take 1,000 mcg by mouth daily.   diclofenac Sodium (VOLTAREN) 1 % GEL Apply 2 g topically 2 (two) times daily. To bilateral legs.   docusate sodium (COLACE) 100 MG capsule Take 100 mg by mouth daily as needed for mild constipation.   DULoxetine (CYMBALTA) 60 MG capsule Take 60 mg by mouth daily.   furosemide (LASIX) 40 MG tablet Take 0.5 tablets (20 mg total) by mouth daily. Increase to 1 tablet (40 mg total) by mouth daily (total daily dose 40 mg) as needed for up to  3 days for increased leg swelling, shortness of breath, weight gain 5+ lbs over 1-2 days. Seek medical advice if these symptoms are not improving with increased dose.   gabapentin (NEURONTIN) 300 MG capsule Take 300 mg by mouth 2 (two) times daily.   guaifenesin (ROBITUSSIN) 100 MG/5ML syrup Take 200 mg by mouth every 4 (four) hours as needed for cough.   hydrALAZINE (APRESOLINE) 50 MG tablet Take 1 tablet (50 mg total) by mouth 3 (three) times daily. Skip the dose if SBP less than 130 mmHg   lactose free nutrition (BOOST) LIQD Take 237 mLs by mouth 2 (two) times daily.   loratadine (CLARITIN) 10 MG tablet Take 10 mg by mouth daily as needed for allergies. AM   melatonin 3 MG TABS tablet Take 3 mg by mouth at bedtime.   methocarbamol (ROBAXIN) 500 MG tablet Take 500 mg by mouth every 8 (eight) hours as needed for muscle spasms.   metoprolol succinate (TOPROL-XL) 25 MG 24 hr tablet Take 1 tablet (25 mg total)  by mouth daily.   Nutritional Supplements (,FEEDING SUPPLEMENT, PROSOURCE PLUS) liquid Take 30 mLs by mouth 2 (two) times daily between meals. For wound healing   OXYGEN Inhale into the lungs. 2lpm   polyethylene glycol (MIRALAX / GLYCOLAX) 17 g packet Take 17 g by mouth daily. Skip the dose if no constipation   senna (SENOKOT) 8.6 MG TABS tablet Take 1 tablet by mouth every other day.   simvastatin (ZOCOR) 20 MG tablet Take 20 mg by mouth every evening. PM   sodium bicarbonate 650 MG tablet Take 650 mg by mouth every 8 (eight) hours as needed for heartburn.   valsartan (DIOVAN) 160 MG tablet Take 160 mg by mouth daily.   Vitamin D, Ergocalciferol, (DRISDOL) 1.25 MG (50000 UNIT) CAPS capsule Take 50,000 Units by mouth every 7 (seven) days.   Wheat Dextrin (BENEFIBER ON THE GO) PACK Take 1 packet by mouth daily as needed.   predniSONE (DELTASONE) 20 MG tablet Take 20 mg by mouth daily with breakfast. (Patient not taking: Reported on 08/10/2023)   SANTYL 250 UNIT/GM ointment Apply 1 Application  topically daily. apply to left medial cuneiform every day (Patient not taking: Reported on 08/06/2023)   Simethicone (GAS-X PO) Take 1 capsule by mouth every 8 (eight) hours as needed (for gas). (Patient not taking: Reported on 08/10/2023)   No facility-administered encounter medications on file as of 08/10/2023.    Review of Systems  Constitutional:  Negative for chills and fever.  HENT:  Negative for tinnitus.   Respiratory:  Negative for cough and shortness of breath.   Cardiovascular:  Negative for chest pain, palpitations and leg swelling.  Gastrointestinal:  Negative for abdominal pain, constipation and diarrhea.  Genitourinary:  Negative for dysuria, frequency and urgency.  Musculoskeletal:  Positive for arthralgias, back pain and gait problem. Negative for myalgias.  Skin: Negative.   Neurological:  Positive for weakness. Negative for dizziness and headaches.  Psychiatric/Behavioral:  The patient is nervous/anxious.      Immunization History  Administered Date(s) Administered   Influenza Inj Mdck Quad Pf 03/26/2016, 02/14/2018, 02/15/2019, 02/25/2021, 03/02/2022   Influenza Split 02/20/2015   Influenza, Seasonal, Injecte, Preservative Fre 04/13/2012, 03/17/2013   Influenza,inj,Quad PF,6+ Mos 03/07/2014, 02/20/2020   Influenza-Unspecified 03/26/2016, 02/04/2017, 02/14/2018, 02/25/2021, 04/12/2023   Moderna Sars-Covid-2 Vaccination 05/24/2019, 06/24/2019, 02/22/2020, 02/26/2022   PNEUMOCOCCAL CONJUGATE-20 04/09/2023   Pneumococcal Conjugate-13 09/01/2013   Pneumococcal Polysaccharide-23 04/13/2012   RSV,unspecified 04/09/2023   Td 08/07/2010   Tdap 08/07/2010   Zoster, Live 09/12/2007   Pertinent  Health Maintenance Due  Topic Date Due   FOOT EXAM  Never done   OPHTHALMOLOGY EXAM  Never done   INFLUENZA VACCINE  12/10/2023   HEMOGLOBIN A1C  12/19/2023   DEXA SCAN  Completed      01/09/2022   10:55 AM 02/18/2022    1:36 PM 03/18/2022    4:19 PM 03/18/2022    4:46 PM  05/13/2022   10:17 AM  Fall Risk  (RETIRED) Patient Fall Risk Level High fall risk High fall risk Moderate fall risk Moderate fall risk High fall risk   Functional Status Survey:    Vitals:   08/10/23 0946  BP: 133/73  Pulse: 71  Weight: 152 lb (68.9 kg)  Height: 5\' 2"  (1.575 m)   Body mass index is 27.8 kg/m. Physical Exam Constitutional:      General: She is not in acute distress.    Appearance: She is well-developed. She is not diaphoretic.  HENT:  Head: Normocephalic and atraumatic.     Mouth/Throat:     Pharynx: No oropharyngeal exudate.  Eyes:     Conjunctiva/sclera: Conjunctivae normal.     Pupils: Pupils are equal, round, and reactive to light.  Cardiovascular:     Rate and Rhythm: Normal rate and regular rhythm.     Heart sounds: Normal heart sounds.  Pulmonary:     Effort: Pulmonary effort is normal.     Breath sounds: Normal breath sounds.  Abdominal:     General: Bowel sounds are normal.     Palpations: Abdomen is soft.  Musculoskeletal:     Cervical back: Normal range of motion and neck supple.     Right lower leg: No edema.     Left lower leg: No edema.  Skin:    General: Skin is warm and dry.  Neurological:     Mental Status: She is alert.  Psychiatric:        Mood and Affect: Mood normal.     Labs reviewed: Recent Labs    04/01/23 0519 04/12/23 0000 05/08/23 0402 05/09/23 0436 05/10/23 0210 05/11/23 0515 07/01/23 0510 07/02/23 0504 07/03/23 0454 07/05/23 0000 07/08/23 0000 07/19/23 0000  NA 138   < > 136 135 133*   < > 141 139 140 140 142 136*  K 4.4   < > 3.7 3.8 4.0   < > 3.5 3.6 3.7 4.1 4.1 4.0  CL 107   < > 108 106 105   < > 105 105 105 105 105 101  CO2 21*   < > 19* 18* 19*   < > 25 25 25  29* 26* 27*  GLUCOSE 156*   < > 136* 119* 137*   < > 141* 115* 113*  --   --   --   BUN 39*   < > 31* 34* 39*   < > 27* 27* 28* 24* 33* 47*  CREATININE 1.58*   < > 1.31* 1.47* 1.56*   < > 1.74* 1.58* 1.56* 1.6* 1.7* 1.9*  CALCIUM 8.5*   <  > 8.9 8.8* 8.6*   < > 9.0 9.1 9.5 9.7 9.3 9.9  MG 2.4  --  1.7 2.0  --   --   --   --   --   --   --   --   PHOS 3.0  --  3.3 3.4 2.9  --   --   --   --   --   --   --    < > = values in this interval not displayed.   Recent Labs    02/22/23 1444 05/08/23 0402 05/10/23 0210 05/27/23 0909 06/29/23 0823  AST 24  --   --  23 21  ALT 14  --   --  15 11  ALKPHOS 62  --   --  66 61  BILITOT 0.4  --   --  0.6 0.7  PROT 7.5  --   --  7.1 7.0  ALBUMIN 4.1   < > 3.5 3.7 3.5   < > = values in this interval not displayed.   Recent Labs    06/21/23 0000 06/29/23 0823 06/30/23 0452 07/01/23 0510 07/08/23 0000  WBC 7.1  7.1 10.4 7.8 7.1 7.2  NEUTROABS 3,862.00 6.9  --   --  3,262.00  HGB 10.2* 10.0* 9.4* 9.6* 9.8*  HCT 31* 29.8* 28.2* 28.4* 31*  MCV  --  92.3 91.3 91.0  --  PLT 246 234 235 233 253   Lab Results  Component Value Date   TSH 2.573 08/26/2020   Lab Results  Component Value Date   HGBA1C 7.7 06/21/2023   Lab Results  Component Value Date   CHOL 75 08/27/2022   HDL 22 (L) 08/27/2022   LDLCALC 34 08/27/2022   TRIG 146 05/08/2023   CHOLHDL 3.4 08/27/2022    Significant Diagnostic Results in last 30 days:  VAS US RENAL ARTERY DUPLEX Result Date: 07/19/2023 ABDOMINAL VISCERAL Patient Name:  Tina Curtis  Date of Exam:   07/16/2023 Medical Rec #: [id number]      Accession #:    [id number] Date of Birth: [birth year]     Patient Gender: F Patient Age:   80 years Exam Location:  [facility] Procedure:      VAS US RENAL ARTERY DUPLEX Referring Phys: [id number] Marlow Baars DEW -------------------------------------------------------------------------------- Indications: Known AAA, Right renal artery stent High Risk Factors: Hypertension, Diabetes, no history of smoking, prior MI,                    prior CVA. Other Factors: Known atrophic left kidney. Vascular Interventions: 04/11/2018 Right RA stent placed. Performing Technologist: Hardie Lora RVT  Examination Guidelines:  A complete evaluation includes B-mode imaging, spectral Doppler, color Doppler, and power Doppler as needed of all accessible portions of each vessel. Bilateral testing is considered an integral part of a complete examination. Limited examinations for reoccurring indications may be performed as noted.  Duplex Findings: +------------+--------+--------+------+--------------+ Mesenteric  PSV cm/sEDV cm/sPlaque   Comments    +------------+--------+--------+------+--------------+ Aorta Prox    121                 2.34 x 2.46 cm +------------+--------+--------+------+--------------+ Aorta Mid     100                 3.79 x 3.44 cm +------------+--------+--------+------+--------------+ Aorta Distal  112                 1.83 x 1.74 cm +------------+--------+--------+------+--------------+    +------------------+--------+--------+-------+ Right Renal ArteryPSV cm/sEDV cm/sComment +------------------+--------+--------+-------+ Origin              250      89    stent  +------------------+--------+--------+-------+ Proximal            259      64           +------------------+--------+--------+-------+ Mid                  82      16           +------------------+--------+--------+-------+ Distal               77      18           +------------------+--------+--------+-------+ +------------+--------+--------+----+-----------+--------+--------+---+ Right KidneyPSV cm/sEDV cm/sRI  Left KidneyPSV cm/sEDV cm/sRI  +------------+--------+--------+----+-----------+--------+--------+---+ Upper Pole                      Upper Pole                     +------------+--------+--------+----+-----------+--------+--------+---+ Mid         69      15      0.                            +------------+--------+--------+----+-----------+--------+--------+---+  Lower Pole                      Lower Pole                      +------------+--------+--------+----+-----------+--------+--------+---+ Hilar                           Hilar                          +------------+--------+--------+----+-----------+--------+--------+---+ +------------------+-------+------------------++ Right Kidney             Left Kidney        +------------------+-------+------------------++ RAR                      RAR                +------------------+-------+------------------++ RAR (manual)      2.14   RAR (manual)       +------------------+-------+------------------++ Cortex                   Cortex             +------------------+-------+------------------++ Cortex thickness  1.25 mmCorex thickness    +------------------+-------+------------------++ Kidney length (cm)10.90  Kidney length (cm) +------------------+-------+------------------++  Summary: Renal:  Right: Normal size right kidney. Normal right Resistive Index. 1-59%        stenosis of the right renal artery. Mesenteric:  Prior AA max diameter 3.70 cm. No significant change today.  *See table(s) above for measurements and observations.  Diagnosing physician: Festus Barren MD  Electronically signed by Festus Barren MD on 07/19/2023 at 1:42:54 PM.    Final     Assessment/Plan 1. CHF NYHA class III, acute, diastolic (HCC) (Primary) euvolemic at this time.  Denies issues with shortness of breath, fluid retention, or chest pain. continue current regimen   2. Type 2 diabetes mellitus with stage 3b chronic kidney disease, without long-term current use of insulin (HCC) A1c is slightly above target at 7.7%. Goal is to maintain it below 7.5% given age of 23 years. Actively reducing sugar intake to improve glycemic control. - Monitor A1c levels  3. Protein deficiency (HCC) Continues on supplement, weight has been stable in the last month.   4. Gout due to renal impairment, unspecified chronicity, unspecified site recent flares in toes, elevated uric acid. will  increase allopurinol to 150 mg daily.  - Continue allopurinol  5. Osteoarthritis of spine with radiculopathy, lumbosacral region Stable, continues on therapy.   6. History of breast cancer On Arimidex (anastrozole) as part of treatment regimen.  - Continue Arimidex - she reports follow up with oncologist in two weeks  7. Moderate episode of recurrent major depressive disorder (HCC) Reports depression and increased anxiety due to recent life changes, including being told she will have to be in SNF long term and  family concerns Discussed with pt that she is currently on cymbalta and due to GFR will decrease to 30 mg daily for 2 weeks and then DC Will monitor mood.  -continues on celexa.   Janene Harvey. [facility] [phone]

## 2023-08-27 ENCOUNTER — Inpatient Hospital Stay (HOSPITAL_BASED_OUTPATIENT_CLINIC_OR_DEPARTMENT_OTHER): Payer: Medicare PPO | Admitting: Internal Medicine

## 2023-08-27 ENCOUNTER — Inpatient Hospital Stay: Payer: Medicare PPO | Attending: Internal Medicine

## 2023-08-27 ENCOUNTER — Encounter: Payer: Self-pay | Admitting: Internal Medicine

## 2023-08-27 ENCOUNTER — Inpatient Hospital Stay

## 2023-08-27 VITALS — BP 140/71 | HR 76 | Temp 97.5°F | Ht 62.0 in

## 2023-08-27 DIAGNOSIS — Z803 Family history of malignant neoplasm of breast: Secondary | ICD-10-CM | POA: Diagnosis not present

## 2023-08-27 DIAGNOSIS — Z9013 Acquired absence of bilateral breasts and nipples: Secondary | ICD-10-CM | POA: Insufficient documentation

## 2023-08-27 DIAGNOSIS — N184 Chronic kidney disease, stage 4 (severe): Secondary | ICD-10-CM | POA: Insufficient documentation

## 2023-08-27 DIAGNOSIS — Z79811 Long term (current) use of aromatase inhibitors: Secondary | ICD-10-CM | POA: Diagnosis not present

## 2023-08-27 DIAGNOSIS — Z1721 Progesterone receptor positive status: Secondary | ICD-10-CM | POA: Diagnosis not present

## 2023-08-27 DIAGNOSIS — Z1732 Human epidermal growth factor receptor 2 negative status: Secondary | ICD-10-CM | POA: Insufficient documentation

## 2023-08-27 DIAGNOSIS — M810 Age-related osteoporosis without current pathological fracture: Secondary | ICD-10-CM

## 2023-08-27 DIAGNOSIS — C50211 Malignant neoplasm of upper-inner quadrant of right female breast: Secondary | ICD-10-CM

## 2023-08-27 DIAGNOSIS — Z170 Estrogen receptor positive status [ER+]: Secondary | ICD-10-CM | POA: Insufficient documentation

## 2023-08-27 LAB — CMP (CANCER CENTER ONLY)
ALT: 19 U/L (ref 0–44)
AST: 25 U/L (ref 15–41)
Albumin: 4.1 g/dL (ref 3.5–5.0)
Alkaline Phosphatase: 91 U/L (ref 38–126)
Anion gap: 11 (ref 5–15)
BUN: 42 mg/dL — ABNORMAL HIGH (ref 8–23)
CO2: 26 mmol/L (ref 22–32)
Calcium: 12.3 mg/dL — ABNORMAL HIGH (ref 8.9–10.3)
Chloride: 101 mmol/L (ref 98–111)
Creatinine: 1.86 mg/dL — ABNORMAL HIGH (ref 0.44–1.00)
GFR, Estimated: 25 mL/min — ABNORMAL LOW (ref >60)
Glucose, Bld: 152 mg/dL — ABNORMAL HIGH (ref 70–99)
Potassium: 3.9 mmol/L (ref 3.5–5.1)
Sodium: 138 mmol/L (ref 135–145)
Total Bilirubin: 0.6 mg/dL (ref 0.0–1.2)
Total Protein: 8.3 g/dL — ABNORMAL HIGH (ref 6.5–8.1)

## 2023-08-27 LAB — CBC WITH DIFFERENTIAL (CANCER CENTER ONLY)
Abs Immature Granulocytes: 0.05 10*3/uL (ref 0.00–0.07)
Basophils Absolute: 0.1 10*3/uL (ref 0.0–0.1)
Basophils Relative: 1 %
Eosinophils Absolute: 0.5 10*3/uL (ref 0.0–0.5)
Eosinophils Relative: 6 %
HCT: 36.9 % (ref 36.0–46.0)
Hemoglobin: 12.3 g/dL (ref 12.0–15.0)
Immature Granulocytes: 1 %
Lymphocytes Relative: 31 %
Lymphs Abs: 2.7 10*3/uL (ref 0.7–4.0)
MCH: 30 pg (ref 26.0–34.0)
MCHC: 33.3 g/dL (ref 30.0–36.0)
MCV: 90 fL (ref 80.0–100.0)
Monocytes Absolute: 0.7 10*3/uL (ref 0.1–1.0)
Monocytes Relative: 8 %
Neutro Abs: 4.9 10*3/uL (ref 1.7–7.7)
Neutrophils Relative %: 53 %
Platelet Count: 240 10*3/uL (ref 150–400)
RBC: 4.1 MIL/uL (ref 3.87–5.11)
RDW: 13.8 % (ref 11.5–15.5)
WBC Count: 9 10*3/uL (ref 4.0–10.5)
nRBC: 0 % (ref 0.0–0.2)

## 2023-08-27 LAB — VITAMIN D 25 HYDROXY (VIT D DEFICIENCY, FRACTURES): Vit D, 25-Hydroxy: 89.17 ng/mL (ref 30–100)

## 2023-08-27 MED ORDER — ANASTROZOLE 1 MG PO TABS
1.0000 mg | ORAL_TABLET | Freq: Every day | ORAL | 1 refills | Status: DC
Start: 2023-08-27 — End: 2024-02-28

## 2023-08-27 MED ORDER — DENOSUMAB 60 MG/ML ~~LOC~~ SOSY
60.0000 mg | PREFILLED_SYRINGE | Freq: Once | SUBCUTANEOUS | Status: AC
  Administered 2023-08-27: 60 mg via SUBCUTANEOUS
  Filled 2023-08-27: qty 1

## 2023-08-27 NOTE — Progress Notes (Signed)
 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 Burnett] stage III T2N3 [19/24 LN]; ER/PR positive HER-2 negative.   # Left breast-invasive mammary carcinoma-status post mastectomy [id number]; stage I-ER/PR positive HER-2 negative  #November 23, 2019-start adjuvant radiation bilateral [finished August 31]; no chemo;   #September 22nd 2021- anastrozole  ----------------------------------------------------------------------------------------   A. RIGHT BREAST, 3:00; ULTRASOUND-GUIDED BIOPSY:  - INVASIVE MAMMARY CARCINOMA WITH MICROPAPILLARY FEATURES; Size of invasive carcinoma: 10mm in this sample  Histologic grade of invasive carcinoma: Grade 2 ; ER/PR > 905: her 2- NEG  A. RIGHT BREAST, 3:00; ULTRASOUND-GUIDED BIOPSY: - INVASIVE MAMMARY CARCINOMA WITH MICROPAPILLARY FEATURES. 10mm in this sample. Grade 2. Lymphovascular invasion: Present.   B. LYMPH NODE, RIGHT AXILLARY; ULTRASOUND-GUIDED NEEDLE CORE BIOPSY: - METASTATIC MAMMARY CARCINOMA, 6 MM IN THIS SAMPLE.   C. RIGHT BREAST, LATERAL CALCIFICATIONS; STEREOTACTIC BIOPSY: - DUCTAL CARCINOMA IN SITU, INTERMEDIATE GRADE, WITH CALCIFICATIONS. -------------------------------------------------------------   # CKD- Stage III-IV [Dr.Kolluru]  # SURVIVORSHIP:   # GENETICS:   DIAGNOSIS:  Right breast cancer stage III;  left breast cancer-stage I     Carcinoma of upper-inner quadrant of right breast in  female, estrogen receptor positive (HCC)  09/27/2019 Initial Diagnosis   Carcinoma of upper-inner quadrant of right breast in female, estrogen receptor positive (HCC)     HISTORY OF PRESENTING ILLNESS: Ambulating in a wheelchair.  With daughter karen.   Tina Curtis 89 y.o.  female synchronous bilateral breast cancer [right stage III ER/PR positive;Her2 NEG; left stage I ER/PR positive; Her2- NEG] s/p adjuvant radiation; currently on Anastrazole is here for follow-up.   Patient was in the hospital recently for congestive heart failure in February.  Patient is currently at the nursing home side of [place].  Otherwise patient denies breast pain, has tenderness with palpation. Denies hot flashes. Appetite is fine. Taking her anastrozole .  Chronic fatigue.  Otherwise no evidence of any confusion.   Review of Systems  Constitutional:  Positive for malaise/fatigue. Negative for chills, diaphoresis, fever and weight loss.  HENT:  Negative for nosebleeds and sore throat.   Eyes:  Negative for double vision.  Respiratory:  Negative for cough, hemoptysis, sputum production, shortness of breath and wheezing.   Cardiovascular:  Negative for chest pain, palpitations, orthopnea and leg swelling.  Gastrointestinal:  Negative for abdominal pain, blood in stool, constipation, diarrhea, heartburn, melena, nausea and vomiting.  Genitourinary:  Negative for dysuria, frequency and urgency.  Musculoskeletal:  Positive for back pain and joint pain.  Neurological:  Negative for dizziness, tingling, focal weakness, weakness and headaches.  Endo/Heme/Allergies:  Does not bruise/bleed easily.  Psychiatric/Behavioral:  Negative for depression. The patient is not nervous/anxious and does not have insomnia.      MEDICAL HISTORY:  Past Medical History:  Diagnosis Date   Arthritis    Gout   Chronic kidney disease    Diarrhea    Hypercholesteremia    Hypertension    Neuropathy    feet and lower legs    Seasonal allergies    Skin cancer    face    SURGICAL  HISTORY: Past Surgical History:  Procedure Laterality Date   ABDOMINAL HYSTERECTOMY  2000   APPENDECTOMY     BREAST BIOPSY Right 09/19/2019   affirm bx of calcs UOQ, x marker, path pending   BREAST BIOPSY Right 09/19/2019   us  bx of mass,heart marker, path pending   BREAST BIOPSY Right 09/19/2019   us  bx of LN, coil marker, path pending   CATARACT EXTRACTION Left    CATARACT EXTRACTION W/PHACO Right 10/24/2014   Procedure: CATARACT EXTRACTION PHACO AND INTRAOCULAR LENS PLACEMENT (IOC);  Surgeon: Annell Kidney, MD;  Location: [facility];  Service: Ophthalmology;  Laterality: Right;   ESOPHAGOGASTRODUODENOSCOPY N/A 03/07/2018   Procedure: ESOPHAGOGASTRODUODENOSCOPY (EGD);  Surgeon: Selena Daily, MD;  Location: [facility] END[facility]COPY;  Service: Gastroenterology;  Laterality: N/A;   MASTECTOMY MODIFIED RADICAL Right 10/13/2019   Procedure: MASTECTOMY MODIFIED RADICAL;  Surgeon: Marshall Skeeter, MD;  Location: [facility] ORS;  Service: General;  Laterality: Right;   RENAL ANGIOGRAPHY Right 04/11/2018   Procedure: RENAL ANGIOGRAPHY;  Surgeon: Celso College, MD;  Location: [facility] INVASIVE CV LAB;  Service: Cardiovascular;  Laterality: Right;   SIMPLE MASTECTOMY WITH AXILLARY SENTINEL NODE BIOPSY Left 10/13/2019   Procedure: SIMPLE MASTECTOMY TRUE CUT BIOPSY, SENTINEL NODE BIOPSY;  Surgeon: Marshall Skeeter, MD;  Location: [facility] ORS;  Service: General;  Laterality: Left;    SOCIAL HISTORY: Social History   Socioeconomic History   Marital status: Widowed    Spouse name: Not on file   Number of children: Not on file   Years of education: Not on file   Highest education level: Not on file  Occupational History   Not on file  Tobacco Use   Smoking status: Never   Smokeless tobacco: Never  Vaping Use   Vaping status: Never Used  Substance and Sexual Activity   Alcohol use: No   Drug use: Never   Sexual activity: Not Currently   Other Topics Concern   Not on file  Social History Narrative   2 daughters; lives in [address]; Runner, broadcasting/film/video retd. 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    Social Drivers of Corporate investment banker Strain: Low Risk  (11/17/2022)   Received from [facility]   Overall Financial Resource Strain (CARDIA)    Difficulty of Paying Living Expenses: Not hard at all  Food Insecurity: No Food Insecurity (06/30/2023)   Hunger Vital Sign    Worried About Running Out of Food in the Last Year: Never true    Ran Out of Food in the Last Year: Never true  Transportation Needs: No Transportation Needs (06/30/2023)   PRAPARE - Administrator, Civil Service (Medical): No    Lack of Transportation (Non-Medical): No  Physical Activity: Not on file  Stress: Not on file  Social Connections: Unknown (06/30/2023)   Social Connection and Isolation Panel [NHANES]    Frequency of Communication with Friends and Family: Never    Frequency of Social Gatherings with Friends and Family: Never    Attends Religious Services: Never    Database administrator or Organizations: No    Attends Banker Meetings: Never    Marital Status: Patient unable to answer  Intimate Partner Violence: Not At Risk (06/30/2023)   Humiliation, Afraid, Rape, and Kick questionnaire    Fear of Current or Ex-Partner: No    Emotionally Abused: No    Physically Abused: No    Sexually Abused: No  FAMILY HISTORY: Family History  Problem Relation Age of Onset   Hypertension Mother    Hypertension Father    Stroke Father    Breast cancer Daughter 22    ALLERGIES:  is allergic to penicillins and empagliflozin .  MEDICATIONS:  Current Outpatient Medications  Medication Sig Dispense Refill   acetaminophen  (TYLENOL ) 325 MG tablet Take 1-2 tablets (325-650 mg total) by mouth every 6 (six) hours as needed for mild pain (pain score 1-3), moderate pain (pain  score 4-6), fever or headache.     allopurinol  (ZYLOPRIM ) 100 MG tablet Take 100 mg by mouth daily.     amLODipine  (NORVASC ) 10 MG tablet Take 10 mg by mouth at bedtime.     anastrozole  (ARIMIDEX ) 1 MG tablet Take 1 tablet (1 mg total) by mouth daily. 90 tablet 1   calcium  carbonate (OS-CAL - DOSED IN MG OF ELEMENTAL CALCIUM ) 1250 (500 Ca) MG tablet Take 2 tablets by mouth daily with breakfast.     citalopram  (CELEXA ) 10 MG tablet Take 10 mg by mouth daily.      cyanocobalamin  (VITAMIN B12) 1000 MCG tablet Take 1,000 mcg by mouth daily.     diclofenac  Sodium (VOLTAREN ) 1 % GEL Apply 2 g topically 2 (two) times daily. To bilateral legs.     docusate sodium  (COLACE) 100 MG capsule Take 100 mg by mouth daily as needed for mild constipation.     DULoxetine  (CYMBALTA ) 60 MG capsule Take 60 mg by mouth daily.     furosemide  (LASIX ) 40 MG tablet Take 0.5 tablets (20 mg total) by mouth daily. Increase to 1 tablet (40 mg total) by mouth daily (total daily dose 40 mg) as needed for up to 3 days for increased leg swelling, shortness of breath, weight gain 5+ lbs over 1-2 days. Seek medical advice if these symptoms are not improving with increased dose.     gabapentin  (NEURONTIN ) 300 MG capsule Take 300 mg by mouth 2 (two) times daily.     guaifenesin  (ROBITUSSIN) 100 MG/5ML syrup Take 200 mg by mouth every 4 (four) hours as needed for cough.     hydrALAZINE  (APRESOLINE ) 50 MG tablet Take 1 tablet (50 mg total) by mouth 3 (three) times daily. Skip the dose if SBP less than 130 mmHg     lactose free nutrition (BOOST) LIQD Take 237 mLs by mouth 2 (two) times daily.     loratadine  (CLARITIN ) 10 MG tablet Take 10 mg by mouth daily as needed for allergies. AM     melatonin 3 MG TABS tablet Take 3 mg by mouth at bedtime.     methocarbamol (ROBAXIN) 500 MG tablet Take 500 mg by mouth every 8 (eight) hours as needed for muscle spasms.     metoprolol  succinate (TOPROL -XL) 25 MG 24 hr tablet Take 1 tablet (25 mg total)  by mouth daily.     Nutritional Supplements (,FEEDING SUPPLEMENT, PROSOURCE PLUS) liquid Take 30 mLs by mouth 2 (two) times daily between meals. For wound healing     OXYGEN Inhale into the lungs. 2lpm     polyethylene glycol (MIRALAX  / GLYCOLAX ) 17 g packet Take 17 g by mouth daily. Skip the dose if no constipation     predniSONE (DELTASONE) 20 MG tablet Take 20 mg by mouth daily with breakfast. (Patient not taking: Reported on 08/10/2023)     SANTYL 250 UNIT/GM ointment Apply 1 Application topically daily. apply to left medial cuneiform every day (Patient not taking: Reported on 08/06/2023)  senna (SENOKOT) 8.6 MG TABS tablet Take 1 tablet by mouth every other day.     Simethicone (GAS-X PO) Take 1 capsule by mouth every 8 (eight) hours as needed (for gas). (Patient not taking: Reported on 08/10/2023)     simvastatin  (ZOCOR ) 20 MG tablet Take 20 mg by mouth every evening. PM     sodium bicarbonate  650 MG tablet Take 650 mg by mouth every 8 (eight) hours as needed for heartburn.     valsartan (DIOVAN) 160 MG tablet Take 160 mg by mouth daily.     Vitamin D , Ergocalciferol , (DRISDOL ) 1.25 MG (50000 UNIT) CAPS capsule Take 50,000 Units by mouth every 7 (seven) days.     Wheat Dextrin (BENEFIBER ON THE GO) PACK Take 1 packet by mouth daily as needed.     No current facility-administered medications for this visit.      Aaron Aas  PHYSICAL EXAMINATION: ECOG PERFORMANCE STATUS: 0 - Asymptomatic  Vitals:   08/27/23 1325  BP: (!) 140/71  Pulse: 76  Temp: (!) 97.5 F (36.4 C)  SpO2: 91%      Filed Weights       Physical Exam Constitutional:      Comments: Walks with a rolling walker.  Alone.  HENT:     Head: Normocephalic and atraumatic.     Mouth/Throat:     Pharynx: No oropharyngeal exudate.  Eyes:     Pupils: Pupils are equal, round, and reactive to light.  Cardiovascular:     Rate and Rhythm: Normal rate and regular rhythm.  Pulmonary:     Effort: Pulmonary effort is normal. No  respiratory distress.     Breath sounds: Normal breath sounds. No wheezing.  Abdominal:     General: Bowel sounds are normal. There is no distension.     Palpations: Abdomen is soft. There is no mass.     Tenderness: There is no abdominal tenderness. There is no guarding or rebound.  Musculoskeletal:        General: No tenderness. Normal range of motion.     Cervical back: Normal range of motion and neck supple.  Skin:    General: Skin is warm.     Comments: Bruising bruising noted on the chest and left arm.  Neurological:     Mental Status: She is alert and oriented to person, place, and time.  Psychiatric:        Mood and Affect: Affect normal.     Results for TSUYAKO, JOLLEY (MRN [id number]) as of 01/15/2021 15:37  Ref. Range 08/27/2020 09:55 09/02/2020 12:32 10/22/2020 14:01 12/04/2020 14:35 01/15/2021 14:50  CA 15-3 Latest Ref Range: 0.0 - 25.0 U/mL   32.0 (H) 27.9 (H)   CA 27.29 Latest Ref Range: 0.0 - 38.6 U/mL   38.0 27.8   CEA Latest Ref Range: 0.0 - 4.7 ng/mL   4.1 3.6    LABORATORY DATA:  I have reviewed the data as listed Lab Results  Component Value Date   WBC 9.0 08/27/2023   HGB 12.3 08/27/2023   HCT 36.9 08/27/2023   MCV 90.0 08/27/2023   PLT 240 08/27/2023   Recent Labs    09/08/22 0913 09/09/22 0000 05/27/23 0909 05/31/23 0000 06/29/23 0823 06/30/23 0452 07/02/23 0504 07/03/23 0454 07/05/23 0000 07/08/23 0000 07/19/23 0000 08/27/23 1313  NA 131*   < > 139   < > 139   < > 139 140   < > 142 136* 138  K 3.7   < >  3.9   < > 4.0   < > 3.6 3.7   < > 4.1 4.0 3.9  CL 101   < > 101   < > 107   < > 105 105   < > 105 101 101  CO2 23   < > 26   < > 22   < > 25 25   < > 26* 27* 26  GLUCOSE 154*   < > 217*  --  162*   < > 115* 113*  --   --   --  152*  BUN 19   < > 29*   < > 27*   < > 27* 28*   < > 33* 47* 42*  CREATININE 1.41*   < > 1.38*   < > 1.54*   < > 1.58* 1.56*   < > 1.7* 1.9* 1.86*  CALCIUM  7.6*   < > 9.4   < > 8.9   < > 9.1 9.5   < > 9.3 9.9 12.3*   GFRNONAA 35*   < > 35*  --  31*   < > 30* 31*  --   --   --  25*  PROT 6.8   < > 7.1  --  7.0  --   --   --   --   --   --  8.3*  ALBUMIN  2.7*   < > 3.7  --  3.5  --   --   --   --   --   --  4.1  AST 62*   < > 23  --  21  --   --   --   --   --   --  25  ALT 34   < > 15  --  11  --   --   --   --   --   --  19  ALKPHOS 111   < > 66  --  61  --   --   --   --   --   --  91  BILITOT 0.6   < > 0.6  --  0.7  --   --   --   --   --   --  0.6  BILIDIR <0.1  --   --   --   --   --   --   --   --   --   --   --   IBILI NOT CALCULATED  --   --   --   --   --   --   --   --   --   --   --    < > = values in this interval not displayed.    RADIOGRAPHIC STUDIES: I have personally reviewed the radiological images as listed and agreed with the findings in the report. No results found.   ASSESSMENT & PLAN:   Carcinoma of upper-inner quadrant of right breast in female, estrogen receptor positive (HCC)  # 2021-Synchronous bilateral breast cancer-status post bilateral mastectomies- Right Breast-stage III ER/PR positive HER-2 NEG; & Left breast cancer stage II ER/PR Pos; Her 2 NEG. PET scan- April 20th, 2023-  Interval bilateral mastectomy and right axillary node dissection. No evidence of chest wall recurrence or thoracic metastatic disease.  Questionable small hypermetabolic lesion centrally in the right hepatic lobe; MRI LIVER  MAY 25th, 2023- NEGATIVE for any cancer; but cysts [see below].    #  CONTINUE  anastrozole  [started in June 2021]. Recommend continue  5- 10 years.  No clinical progression of disease or recurrence of breast cancer.  Continue surveillance without imaging at this time. refilled   # Subdural hematoma: [on asprin+plavix ]- DEC 2024- s/p fall-  stable.   # chronic headaches-/tremors/peripheral neuropathy ? [place] Dr.Shah > 4 years];  MRI- Brain May  2023-  No acute intracranial pathology. S/p evaluation with Dr.Shah- on baclofen. Stable.   # JAN 2024- [hospital] acute hypoxic  respiratory failure. JAn 2024- CT chest showed chronic findings including radiation fibrosis, emphysema, pulmonary hypertension-patient currently weaned off oxygen- stable  # Elevated BG-[Dr.Baboff- HbA1C- 7.4]; not on medications.  Defer to PCP re:  management of DM-  Stable.   # Mild-moderate arthritic pain-osteoarthritis [bil knee L >R] G-1-2; continue tylenol  prn.  Stable.   # Hypercalcemia- vit D def [on ergocalciferol ]--ca- 12.3- HOLD vitD+ ca. Discussed with Dr.Beamer.  Proceed with Prolia  today.  Repeat calcium  in 1 week.  # Fatigue ? Etiology; s/p Evaluation with Evone Hoh- s/p CARE program- Stable.   # CKD- Stage- IV- GFR 29 [Dr.Kolluru]; -Stable.   # NOV 2021- OsteoporosisT-scoreof -3.7 Prolia  q 6 months [Feb 2023]- BMD- April 2024- stable.  OFF ca+vit D- see above  # MAY 2023- [liver MRI]-  Multi cystic lesions of the pancreas with enlargement measuring 2.6 cm greatest axial dimension; with relative stability since 2021. without high-risk features aside from enlargement since 2019. Recommend in may in 2025- HOLD off for now given multiple medical issues/age etc.    Prolia  q 55m- 08/27/2023-   # DISPOSITION:  # Prolia  today # script for HOLDING vit D and calcium ; script  for checking calcium  in 1 week- at [facility] # follow up in 3 month- MD; labs- cbc/cmp; ca27;29; CEA; ca15-3; 25-OH vit D; prolia --  Dr.B   Gwyn Leos, MD 08/27/2023 3:43 PM

## 2023-08-27 NOTE — Assessment & Plan Note (Addendum)
#   2021-Synchronous bilateral breast cancer-status post bilateral mastectomies- Right Breast-stage III ER/PR positive HER-2 NEG; & Left breast cancer stage II ER/PR Pos; Her 2 NEG. PET scan- April 20th, 2023-  Interval bilateral mastectomy and right axillary node dissection. No evidence of chest wall recurrence or thoracic metastatic disease.  Questionable small hypermetabolic lesion centrally in the right hepatic lobe; MRI LIVER  MAY 25th, 2023- NEGATIVE for any cancer; but cysts [see below].    # CONTINUE  anastrozole  [started in June 2021]. Recommend continue  5- 10 years.  No clinical progression of disease or recurrence of breast cancer.  Continue surveillance without imaging at this time. refilled   # Subdural hematoma: [on asprin+plavix ]- DEC 2024- s/p fall-  stable.   # chronic headaches-/tremors/peripheral neuropathy ? [place] Dr.Shah > 4 years];  MRI- Brain May  2023-  No acute intracranial pathology. S/p evaluation with Dr.Shah- on baclofen. Stable.   # JAN 2024- [hospital] acute hypoxic respiratory failure. JAn 2024- CT chest showed chronic findings including radiation fibrosis, emphysema, pulmonary hypertension-patient currently weaned off oxygen- stable  # Elevated BG-[Dr.Baboff- HbA1C- 7.4]; not on medications.  Defer to PCP re:  management of DM-  Stable.   # Mild-moderate arthritic pain-osteoarthritis [bil knee L >R] G-1-2; continue tylenol  prn.  Stable.   # Hypercalcemia- vit D def [on ergocalciferol ]--ca- 12.3- HOLD vitD+ ca. Discussed with Dr.Beamer.  Proceed with Prolia  today.  Repeat calcium  in 1 week.  # Fatigue ? Etiology; s/p Evaluation with [facility]- s/p CARE program- Stable.   # CKD- Stage- IV- GFR 29 [Dr.Kolluru]; -Stable.   # NOV 2021- OsteoporosisT-scoreof -3.7 Prolia  q 6 months [Feb 2023]- BMD- April 2024- stable.  OFF ca+vit D- see above  # MAY 2023- [liver MRI]-  Multi cystic lesions of the pancreas with enlargement measuring 2.6 cm greatest axial dimension; with  relative stability since 2021. without high-risk features aside from enlargement since 2019. Recommend in may in 2025- HOLD off for now given multiple medical issues/age etc.    Prolia  q 30m- 08/27/2023-   # DISPOSITION:  # Prolia  today # script for HOLDING vit D and calcium ; script  for checking calcium  in 1 week- at [facility] # follow up in 3 month- MD; labs- cbc/cmp; ca27;29; CEA; ca15-3; 25-OH vit D; prolia --  Dr.B

## 2023-08-27 NOTE — Progress Notes (Signed)
 Refill anastrozole , pended. How much longer  does she have to take this?  Was at [facility] 07/03/23 for heart failure. Can't remember the name of her cardiologist.

## 2023-08-28 LAB — CANCER ANTIGEN 27.29: CA 27.29: 43.9 U/mL — ABNORMAL HIGH (ref 0.0–38.6)

## 2023-08-28 LAB — CEA: CEA: 4.9 ng/mL — ABNORMAL HIGH (ref 0.0–4.7)

## 2023-08-28 LAB — CANCER ANTIGEN 15-3: CA 15-3: 36.3 U/mL — ABNORMAL HIGH (ref 0.0–25.0)

## 2023-09-05 ENCOUNTER — Encounter: Payer: Self-pay | Admitting: Student

## 2023-09-05 DIAGNOSIS — G8929 Other chronic pain: Secondary | ICD-10-CM

## 2023-09-06 ENCOUNTER — Non-Acute Institutional Stay (SKILLED_NURSING_FACILITY): Admitting: Student

## 2023-09-06 DIAGNOSIS — F339 Major depressive disorder, recurrent, unspecified: Secondary | ICD-10-CM | POA: Diagnosis not present

## 2023-09-06 DIAGNOSIS — F419 Anxiety disorder, unspecified: Secondary | ICD-10-CM | POA: Diagnosis not present

## 2023-09-06 DIAGNOSIS — M62838 Other muscle spasm: Secondary | ICD-10-CM

## 2023-09-06 LAB — COMPREHENSIVE METABOLIC PANEL WITH GFR: Calcium: 8 — AB (ref 8.7–10.7)

## 2023-09-06 LAB — VITAMIN D 25 HYDROXY (VIT D DEFICIENCY, FRACTURES): Vit D, 25-Hydroxy: 83

## 2023-09-06 NOTE — Telephone Encounter (Signed)
 Message routed to Dr.Beamer. Are you taking over care for this patient?

## 2023-09-07 ENCOUNTER — Other Ambulatory Visit: Payer: Self-pay | Admitting: Nurse Practitioner

## 2023-09-07 ENCOUNTER — Encounter: Payer: Self-pay | Admitting: Student

## 2023-09-07 MED ORDER — PREGABALIN 50 MG PO CAPS: 50.0000 mg | ORAL_CAPSULE | Freq: Every day | ORAL | 1 refills | Status: DC

## 2023-09-07 MED ORDER — LORAZEPAM 0.5 MG PO TABS: 0.5000 mg | ORAL_TABLET | Freq: Three times a day (TID) | ORAL | 0 refills | Status: DC | PRN

## 2023-09-07 NOTE — Progress Notes (Signed)
 Location:  Other Nursing Home Room Number: [facility] [address] Place of Service:  SNF [phone]) Provider:  Richrd Char, Autry Legions, MD  Patient Care Team: Nestor Banter, MD as PCP - General (Family Medicine) Gwyn Leos, MD as Consulting Physician (Internal Medicine) Marquita Situ Magali Schmitz, MD as Consulting Physician (General Surgery) Arlette Benders, RN (Inactive) as Oncology Nurse Navigator Glenis Langdon, MD as Referring Physician (Radiation Oncology)  Extended Emergency Contact Information Primary Emergency Contact: [facility] Phone: [phone] Relation: Daughter Secondary Emergency Contact: Hicklin,Roxanne Mobile Phone: [phone] Relation: Niece  Code Status:  DNR Goals of care: Advanced Directive information    08/27/2023    1:15 PM  Advanced Directives  Does Patient Have a Medical Advance Directive? Yes  Type of Estate agent of [place];Living will     Chief Complaint  Patient presents with   Pain    HPI:  Pt is a 89 y.o. female seen today for an acute visit for  Discussed the use of AI scribe software for clinical note transcription with the patient, who gave verbal consent to proceed.  History of Present Illness   History of Present Illness The patient presents with severe pain in the left leg and difficulty sleeping.  She experiences severe pain in the left leg, particularly in the knee and ankle, described as constant, cramp-like, and accompanied by significant tightness. The pain is severe enough to impact her ability to sleep, as she hasn't slept in a week. Rubbing the area provides some relief, but the pain persists.  The pain has significantly impacted her daily living activities, as she has not showered in two weeks and missed meals for several days last week. She wants to 'pull off my pants and lie down' due to the severity of the pain.  She has a history of a brain bleed, which she believes may be related to  the spasticity in her leg. This issue was discussed months ago when she first arrived at the facility.  She is currently taking hydralazine  and has been given Ativan for anxiety, which she describes as being part of a cycle of pain and anxiety. She also mentions having been on cyclobenzaprine previously, but it is unclear if she is currently taking it.   Past Medical History:  Diagnosis Date   Arthritis    Gout   Chronic kidney disease    Diarrhea    Hypercholesteremia    Hypertension    Neuropathy    feet and lower legs   Seasonal allergies    Skin cancer    face   Past Surgical History:  Procedure Laterality Date   ABDOMINAL HYSTERECTOMY  2000   APPENDECTOMY     BREAST BIOPSY Right 09/19/2019   affirm bx of calcs UOQ, x marker, path pending   BREAST BIOPSY Right 09/19/2019   us  bx of mass,heart marker, path pending   BREAST BIOPSY Right 09/19/2019   us  bx of LN, coil marker, path pending   CATARACT EXTRACTION Left    CATARACT EXTRACTION W/PHACO Right 10/24/2014   Procedure: CATARACT EXTRACTION PHACO AND INTRAOCULAR LENS PLACEMENT (IOC);  Surgeon: Annell Kidney, MD;  Location: [facility];  Service: Ophthalmology;  Laterality: Right;   ESOPHAGOGASTRODUODENOSCOPY N/A 03/07/2018   Procedure: ESOPHAGOGASTRODUODENOSCOPY (EGD);  Surgeon: Selena Daily, MD;  Location: [facility] ENDOSCOPY;  Service: Gastroenterology;  Laterality: N/A;   MASTECTOMY MODIFIED RADICAL Right 10/13/2019   Procedure: MASTECTOMY MODIFIED RADICAL;  Surgeon: Marshall Skeeter, MD;  Location: [facility] ORS;  Service: General;  Laterality: Right;   RENAL ANGIOGRAPHY Right 04/11/2018   Procedure: RENAL ANGIOGRAPHY;  Surgeon: Celso College, MD;  Location: [facility] INVASIVE CV LAB;  Service: Cardiovascular;  Laterality: Right;   SIMPLE MASTECTOMY WITH AXILLARY SENTINEL NODE BIOPSY Left 10/13/2019   Procedure: SIMPLE MASTECTOMY TRUE CUT BIOPSY, SENTINEL NODE BIOPSY;  Surgeon: Marshall Skeeter, MD;  Location: [facility]  ORS;  Service: General;  Laterality: Left;    Allergies  Allergen Reactions   Penicillins Anaphylaxis    Patient tolerates ceftriaxone    Empagliflozin  Rash    Outpatient Encounter Medications as of 09/06/2023  Medication Sig   acetaminophen  (TYLENOL ) 325 MG tablet Take 1-2 tablets (325-650 mg total) by mouth every 6 (six) hours as needed for mild pain (pain score 1-3), moderate pain (pain score 4-6), fever or headache.   allopurinol  (ZYLOPRIM ) 100 MG tablet Take 100 mg by mouth daily.   amLODipine  (NORVASC ) 10 MG tablet Take 10 mg by mouth at bedtime.   anastrozole  (ARIMIDEX ) 1 MG tablet Take 1 tablet (1 mg total) by mouth daily.   calcium  carbonate (OS-CAL - DOSED IN MG OF ELEMENTAL CALCIUM ) 1250 (500 Ca) MG tablet Take 2 tablets by mouth daily with breakfast.   citalopram  (CELEXA ) 10 MG tablet Take 10 mg by mouth daily.    cyanocobalamin  (VITAMIN B12) 1000 MCG tablet Take 1,000 mcg by mouth daily.   diclofenac  Sodium (VOLTAREN ) 1 % GEL Apply 2 g topically 2 (two) times daily. To bilateral legs.   docusate sodium  (COLACE) 100 MG capsule Take 100 mg by mouth daily as needed for mild constipation.   DULoxetine  (CYMBALTA ) 60 MG capsule Take 60 mg by mouth daily.   furosemide  (LASIX ) 40 MG tablet Take 0.5 tablets (20 mg total) by mouth daily. Increase to 1 tablet (40 mg total) by mouth daily (total daily dose 40 mg) as needed for up to 3 days for increased leg swelling, shortness of breath, weight gain 5+ lbs over 1-2 days. Seek medical advice if these symptoms are not improving with increased dose.   gabapentin  (NEURONTIN ) 300 MG capsule Take 300 mg by mouth 2 (two) times daily.   guaifenesin  (ROBITUSSIN) 100 MG/5ML syrup Take 200 mg by mouth every 4 (four) hours as needed for cough.   hydrALAZINE  (APRESOLINE ) 50 MG tablet Take 1 tablet (50 mg total) by mouth 3 (three) times daily. Skip the dose if SBP less than 130 mmHg   lactose free nutrition (BOOST) LIQD Take 237 mLs by mouth 2 (two) times  daily.   loratadine  (CLARITIN ) 10 MG tablet Take 10 mg by mouth daily as needed for allergies. AM   melatonin 3 MG TABS tablet Take 3 mg by mouth at bedtime.   methocarbamol (ROBAXIN) 500 MG tablet Take 500 mg by mouth every 8 (eight) hours as needed for muscle spasms.   metoprolol  succinate (TOPROL -XL) 25 MG 24 hr tablet Take 1 tablet (25 mg total) by mouth daily.   Nutritional Supplements (,FEEDING SUPPLEMENT, PROSOURCE PLUS) liquid Take 30 mLs by mouth 2 (two) times daily between meals. For wound healing   OXYGEN Inhale into the lungs. 2lpm   polyethylene glycol (MIRALAX  / GLYCOLAX ) 17 g packet Take 17 g by mouth daily. Skip the dose if no constipation   predniSONE (DELTASONE) 20 MG tablet Take 20 mg by mouth daily with breakfast. (Patient not taking: Reported on 08/10/2023)   SANTYL 250 UNIT/GM ointment Apply 1 Application topically daily. apply to left medial cuneiform every day (Patient not taking:  Reported on 08/06/2023)   senna (SENOKOT) 8.6 MG TABS tablet Take 1 tablet by mouth every other day.   Simethicone (GAS-X PO) Take 1 capsule by mouth every 8 (eight) hours as needed (for gas). (Patient not taking: Reported on 08/10/2023)   simvastatin  (ZOCOR ) 20 MG tablet Take 20 mg by mouth every evening. PM   sodium bicarbonate  650 MG tablet Take 650 mg by mouth every 8 (eight) hours as needed for heartburn.   valsartan (DIOVAN) 160 MG tablet Take 160 mg by mouth daily.   Vitamin D , Ergocalciferol , (DRISDOL ) 1.25 MG (50000 UNIT) CAPS capsule Take 50,000 Units by mouth every 7 (seven) days.   Wheat Dextrin (BENEFIBER ON THE GO) PACK Take 1 packet by mouth daily as needed.   No facility-administered encounter medications on file as of 09/06/2023.    Review of Systems  Immunization History  Administered Date(s) Administered   Influenza Inj Mdck Quad Pf 03/26/2016, 02/14/2018, 02/15/2019, 02/25/2021, 03/02/2022   Influenza Split 02/20/2015   Influenza, Seasonal, Injecte, Preservative Fre  04/13/2012, 03/17/2013   Influenza,inj,Quad PF,6+ Mos 03/07/2014, 02/20/2020   Influenza-Unspecified 03/26/2016, 02/04/2017, 02/14/2018, 02/25/2021, 04/12/2023   Moderna Sars-Covid-2 Vaccination 05/24/2019, 06/24/2019, 02/22/2020, 02/26/2022   PNEUMOCOCCAL CONJUGATE-20 04/09/2023   Pneumococcal Conjugate-13 09/01/2013   Pneumococcal Polysaccharide-23 04/13/2012   RSV,unspecified 04/09/2023   Td 08/07/2010   Tdap 08/07/2010   Zoster, Live 09/12/2007   Pertinent  Health Maintenance Due  Topic Date Due   FOOT EXAM  Never done   OPHTHALMOLOGY EXAM  Never done   INFLUENZA VACCINE  12/10/2023   HEMOGLOBIN A1C  12/19/2023   DEXA SCAN  Completed      01/09/2022   10:55 AM 02/18/2022    1:36 PM 03/18/2022    4:19 PM 03/18/2022    4:46 PM 05/13/2022   10:17 AM  Fall Risk  (RETIRED) Patient Fall Risk Level High fall risk High fall risk Moderate fall risk Moderate fall risk High fall risk   Functional Status Survey:    There were no vitals filed for this visit. There is no height or weight on file to calculate BMI. Physical Exam Constitutional:      General: She is in acute distress.     Comments: Thin  Cardiovascular:     Rate and Rhythm: Normal rate.  Neurological:     Mental Status: She is alert.     Comments: Oriented to self and place     Labs reviewed: Recent Labs    04/01/23 0519 04/12/23 0000 05/08/23 0402 05/09/23 0436 05/10/23 0210 05/11/23 0515 07/02/23 0504 07/03/23 0454 07/05/23 0000 07/08/23 0000 07/19/23 0000 08/27/23 1313  NA 138   < > 136 135 133*   < > 139 140   < > 142 136* 138  K 4.4   < > 3.7 3.8 4.0   < > 3.6 3.7   < > 4.1 4.0 3.9  CL 107   < > 108 106 105   < > 105 105   < > 105 101 101  CO2 21*   < > 19* 18* 19*   < > 25 25   < > 26* 27* 26  GLUCOSE 156*   < > 136* 119* 137*   < > 115* 113*  --   --   --  152*  BUN 39*   < > 31* 34* 39*   < > 27* 28*   < > 33* 47* 42*  CREATININE 1.58*   < > 1.31* 1.47*  1.56*   < > 1.58* 1.56*   < > 1.7* 1.9*  1.86*  CALCIUM  8.5*   < > 8.9 8.8* 8.6*   < > 9.1 9.5   < > 9.3 9.9 12.3*  MG 2.4  --  1.7 2.0  --   --   --   --   --   --   --   --   PHOS 3.0  --  3.3 3.4 2.9  --   --   --   --   --   --   --    < > = values in this interval not displayed.   Recent Labs    05/27/23 0909 06/29/23 0823 08/27/23 1313  AST 23 21 25   ALT 15 11 19   ALKPHOS 66 61 91  BILITOT 0.6 0.7 0.6  PROT 7.1 7.0 8.3*  ALBUMIN  3.7 3.5 4.1   Recent Labs    06/29/23 0823 06/30/23 0452 07/01/23 0510 07/08/23 0000 08/27/23 1313  WBC 10.4 7.8 7.1 7.2 9.0  NEUTROABS 6.9  --   --  3,262.00 4.9  HGB 10.0* 9.4* 9.6* 9.8* 12.3  HCT 29.8* 28.2* 28.4* 31* 36.9  MCV 92.3 91.3 91.0  --  90.0  PLT 234 235 233 253 240   Lab Results  Component Value Date   TSH 2.573 08/26/2020   Lab Results  Component Value Date   HGBA1C 7.7 06/21/2023   Lab Results  Component Value Date   CHOL 75 08/27/2022   HDL 22 (L) 08/27/2022   LDLCALC 34 08/27/2022   TRIG 146 05/08/2023   CHOLHDL 3.4 08/27/2022    Significant Diagnostic Results in last 30 days:  No results found.  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 Baclofen is considered for muscle spasms as it may provide stronger relief than cyclobenzaprine, which she was previously on. - Start baclofen for muscle spasms - Consider Lyrica for additional relief  Chronic pain in left leg Severe chronic pain in the left leg, exacerbated by muscle spasticity, impacting quality of life and daily functioning. Discussed potential use of medications that may alter heart rhythm and increase fall risk, with benefits potentially outweighing risks given current suffering. Informed consent obtained regarding risks, including potential changes in heart rhythm, increased fall risk, confusion, and increased mortality, especially in  nonagenarians. - Administer Ativan for anxiety and pain relief - Continue muscle relaxers and acetaminophen  as needed - Monitor for side effects, including changes in heart rhythm and increased fall risk  Anxiety and Depression Anxiety contributes to a cycle of pain and distress, exacerbating pain perception. Addressing anxiety is crucial to breaking this cycle and improving overall comfort. Ativan is recommended to manage anxiety and potentially alleviate pain by breaking the cycle of anxiety and pain. Patient has heightened emotions since the transition to healthcare for long-term care.  - Administer Ativan for anxiety relief - Provide reassurance and support  Aleea Keef was informed of the potential risks and benefits of starting/increasing the dose of psychotropic medications, in accordance with CMS guidelines for informed consent.  The discussion included the following key points: Purpose of Medication: Explanation of the specific psychiatric condition being treated (e.g., depression, anxiety, agitation, end of life care) and how the medication is expected to improve the patient's symptoms. Risks: A detailed review of the potential side effects of the medication(s), including but not limited  to sedation, cognitive decline, falls, weight gain, or changes in behavior. The risks of overmedication or inappropriate dosing, as well as the potential for drug interactions, were discussed. Benefits: The potential benefits of the medication(s) were explained, including symptom relief, improved quality of life, and possible reduction in distress or behavioral problems. It was emphasized that benefits may vary and are not guaranteed. Alternative Approaches: Other non-pharmacological treatments or alternative medications, if applicable, were reviewed, and the patient/family was given the opportunity to ask questions and express preferences. Monitoring Plan: The need for close monitoring of medication  effects, including follow-up appointments, lab tests, and behavioral assessments, was outlined. Family members were made aware of how the patient's progress will be tracked. Voluntary Participation: It was emphasized that the decision to start or increase the medication is voluntary and the family has the right to withdraw consent at any time without penalty. The family was encouraged to ask questions and seek further clarification as needed.  The family acknowledged understanding the risks, benefits, and alternatives discussed and gave verbal consent to proceed with the treatment plan. Written consent will be obtained by facility administrative staff.   Family/ staff Communication: Nursing  Labs/tests ordered:  none  I spent greater than 30  minutes for the care of this patient in face to face time, chart review, clinical documentation, patient education. I spent an additional 16 minutes discussing goals of care and advanced care planning.

## 2023-09-09 LAB — BASIC METABOLIC PANEL WITH GFR
BUN: 28 — AB (ref 4–21)
CO2: 21 (ref 13–22)
Chloride: 112 — AB (ref 99–108)
Creatinine: 1.4 — AB (ref 0.5–1.1)
Glucose: 132
Potassium: 4.7 meq/L (ref 3.5–5.1)
Sodium: 141 (ref 137–147)

## 2023-09-09 LAB — COMPREHENSIVE METABOLIC PANEL WITH GFR
Albumin: 3.9 (ref 3.5–5.0)
Calcium: 8.7 (ref 8.7–10.7)
Globulin: 2.9
eGFR: 35

## 2023-09-09 LAB — HEPATIC FUNCTION PANEL
ALT: 10 U/L (ref 7–35)
AST: 18 (ref 13–35)
Alkaline Phosphatase: 72 (ref 25–125)
Bilirubin, Total: 0.3

## 2023-09-09 LAB — CBC: RBC: 3.63 — AB (ref 3.87–5.11)

## 2023-09-09 LAB — CBC AND DIFFERENTIAL
HCT: 34 — AB (ref 36–46)
Hemoglobin: 10.7 — AB (ref 12.0–16.0)
Platelets: 222 10*3/uL (ref 150–400)
WBC: 7.5

## 2023-09-13 LAB — HEMOGLOBIN A1C: Hemoglobin A1C: 8

## 2023-09-13 MED ORDER — PREGABALIN 100 MG PO CAPS: 100.0000 mg | ORAL_CAPSULE | Freq: Every evening | ORAL | 0 refills | Status: DC

## 2023-09-20 ENCOUNTER — Encounter: Payer: Self-pay | Admitting: Student

## 2023-09-20 ENCOUNTER — Non-Acute Institutional Stay (SKILLED_NURSING_FACILITY): Payer: Self-pay | Admitting: Student

## 2023-09-20 DIAGNOSIS — E1122 Type 2 diabetes mellitus with diabetic chronic kidney disease: Secondary | ICD-10-CM

## 2023-09-20 DIAGNOSIS — D631 Anemia in chronic kidney disease: Secondary | ICD-10-CM

## 2023-09-20 DIAGNOSIS — G8929 Other chronic pain: Secondary | ICD-10-CM

## 2023-09-20 DIAGNOSIS — M62838 Other muscle spasm: Secondary | ICD-10-CM | POA: Diagnosis not present

## 2023-09-20 DIAGNOSIS — N184 Chronic kidney disease, stage 4 (severe): Secondary | ICD-10-CM

## 2023-09-20 DIAGNOSIS — N1832 Chronic kidney disease, stage 3b: Secondary | ICD-10-CM

## 2023-09-20 NOTE — Progress Notes (Signed)
 Location:  Other Nursing Home Room Number: [facility] 505A Place of Service:  SNF [phone]) Provider:  Alver Jobs, Lisa Rideau, MD  Patient Care Team: Valrie Gehrig, MD as PCP - General (Family Medicine) Gwyn Leos, MD as Consulting Physician (Internal Medicine) Marquita Situ Magali Schmitz, MD as Consulting Physician (General Surgery) Arlette Benders, RN (Inactive) as Oncology Nurse Navigator Glenis Langdon, MD as Referring Physician (Radiation Oncology)  Extended Emergency Contact Information Primary Emergency Contact: [facility] Phone: [phone] Relation: Daughter Secondary Emergency Contact: Hicklin,Roxanne Mobile Phone: [phone] Relation: Niece  Code Status:  DNR Goals of care: Advanced Directive information    08/27/2023    1:15 PM  Advanced Directives  Does Patient Have a Medical Advance Directive? Yes  Type of Estate agent of [place];Living will     Chief Complaint  Patient presents with   pain control    HPI:  Pt is a 89 y.o. female seen today for an acute visit for  Discussed the use of AI scribe software for clinical note transcription with the patient, who gave verbal consent to proceed.  History of Present Illness   History of Present Illness The patient presents with chronic pain management and concerns about vision changes.  The patient has been experiencing significant pain, previously severe and unrelieved, but recently better controlled with current medication. They want to increase mobility and engage in activities such as walking but are limited by the pain. Persistent soreness is noted at the ribcage, with concerns about posture contributing to this pain. They have experienced weight loss, some of which was fluid, but report a good appetite.  There is a significant decline in vision, with difficulty reading cards and identifying food until tasted. This has led to a reluctance to eat in the dining room due  to fear of being messy, resulting in them staying in their room more often.  Current medications include Ativan  0.5 mg every eight hours, Lasix , Lexapro, Lyrica  100 mg nightly, and baclofen. They have discontinued Cymbalta . Diclofenac  gel is used primarily at night before bed, with reduced use during the day.  Muscle relaxation and reduced spasms in the leg are reported, though difficulty lifting the leg due to pain persists, a problem ongoing for about a year.  They have regular bowel movements and no issues with breathing.  Past Medical History - History of pain - History of diabetes  Medications - Diclofenac  gel - Lyrica  100 mg nightly - Ativan  0.5 mg every 8 hours - Lasix  - Lexapro - Baclofen  Physical Exam MEASUREMENTS: Weight- 152.  Results LABS A1c: 8.0 (09/13/2023) Hemoglobin (Hb): 10.7  Assessment and Plan    Past Medical History:  Diagnosis Date   Arthritis    Gout   Chronic kidney disease    Diarrhea    Hypercholesteremia    Hypertension    Neuropathy    feet and lower legs   Seasonal allergies    Skin cancer    face   Past Surgical History:  Procedure Laterality Date   ABDOMINAL HYSTERECTOMY  2000   APPENDECTOMY     BREAST BIOPSY Right 09/19/2019   affirm bx of calcs UOQ, x marker, path pending   BREAST BIOPSY Right 09/19/2019   us  bx of mass,heart marker, path pending   BREAST BIOPSY Right 09/19/2019   us  bx of LN, coil marker, path pending   CATARACT EXTRACTION Left    CATARACT EXTRACTION W/PHACO Right 10/24/2014   Procedure: CATARACT EXTRACTION PHACO AND INTRAOCULAR LENS  PLACEMENT (IOC);  Surgeon: Annell Kidney, MD;  Location: [facility];  Service: Ophthalmology;  Laterality: Right;   ESOPHAGOGASTRODUODENOSCOPY N/A 03/07/2018   Procedure: ESOPHAGOGASTRODUODENOSCOPY (EGD);  Surgeon: Selena Daily, MD;  Location: [facility];  Service: Gastroenterology;  Laterality: N/A;   MASTECTOMY MODIFIED RADICAL Right 10/13/2019    Procedure: MASTECTOMY MODIFIED RADICAL;  Surgeon: Marshall Skeeter, MD;  Location: [facility] ORS;  Service: General;  Laterality: Right;   RENAL ANGIOGRAPHY Right 04/11/2018   Procedure: RENAL ANGIOGRAPHY;  Surgeon: Celso College, MD;  Location: [facility] INVASIVE CV [facility];  Service: Cardiovascular;  Laterality: Right;   SIMPLE MASTECTOMY WITH AXILLARY SENTINEL NODE BIOPSY Left 10/13/2019   Procedure: SIMPLE MASTECTOMY TRUE CUT BIOPSY, SENTINEL NODE BIOPSY;  Surgeon: Marshall Skeeter, MD;  Location: [facility]S;  Service: General;  Laterality: Left;    Allergies  Allergen Reactions   Penicillins Anaphylaxis    Patient tolerates ceftriaxone    Empagliflozin  Rash    Outpatient Encounter Medications as of 09/20/2023  Medication Sig   acetaminophen  (TYLENOL ) 325 MG tablet Take 1-2 tablets (325-650 mg total) by mouth every 6 (six) hours as needed for mild pain (pain score 1-3), moderate pain (pain score 4-6), fever or headache.   allopurinol  (ZYLOPRIM ) 100 MG tablet Take 100 mg by mouth daily.   amLODipine  (NORVASC ) 10 MG tablet Take 10 mg by mouth at bedtime.   anastrozole  (ARIMIDEX ) 1 MG tablet Take 1 tablet (1 mg total) by mouth daily.   calcium  carbonate (OS-CAL - DOSED IN MG OF ELEMENTAL CALCIUM ) 1250 (500 Ca) MG tablet Take 2 tablets by mouth daily with breakfast.   citalopram  (CELEXA ) 10 MG tablet Take 10 mg by mouth daily.    cyanocobalamin  (VITAMIN B12) 1000 MCG tablet Take 1,000 mcg by mouth daily.   diclofenac  Sodium (VOLTAREN ) 1 % GEL Apply 2 g topically 2 (two) times daily. To bilateral legs.   docusate sodium  (COLACE) 100 MG capsule Take 100 mg by mouth daily as needed for mild constipation.   DULoxetine  (CYMBALTA ) 60 MG capsule Take 60 mg by mouth daily.   furosemide  (LASIX ) 40 MG tablet Take 0.5 tablets (20 mg total) by mouth daily. Increase to 1 tablet (40 mg total) by mouth daily (total daily dose 40 mg) as needed for up to 3 days for increased leg swelling, shortness of breath, weight gain 5+  lbs over 1-2 days. Seek medical advice if these symptoms are not improving with increased dose.   guaifenesin  (ROBITUSSIN) 100 MG/5ML syrup Take 200 mg by mouth every 4 (four) hours as needed for cough.   hydrALAZINE  (APRESOLINE ) 50 MG tablet Take 1 tablet (50 mg total) by mouth 3 (three) times daily. Skip the dose if SBP less than 130 mmHg   lactose free nutrition (BOOST) LIQD Take 237 mLs by mouth 2 (two) times daily.   loratadine  (CLARITIN ) 10 MG tablet Take 10 mg by mouth daily as needed for allergies. AM   LORazepam  (ATIVAN ) 0.5 MG tablet Take 1 tablet (0.5 mg total) by mouth 3 (three) times daily as needed for anxiety.   melatonin 3 MG TABS tablet Take 3 mg by mouth at bedtime.   methocarbamol (ROBAXIN) 500 MG tablet Take 500 mg by mouth every 8 (eight) hours as needed for muscle spasms.   metoprolol  succinate (TOPROL -XL) 25 MG 24 hr tablet Take 1 tablet (25 mg total) by mouth daily.   Nutritional Supplements (,FEEDING SUPPLEMENT, PROSOURCE PLUS) liquid Take 30 mLs by mouth 2 (two) times daily between meals.  For wound healing   OXYGEN Inhale into the lungs. 2lpm   polyethylene glycol (MIRALAX  / GLYCOLAX ) 17 g packet Take 17 g by mouth daily. Skip the dose if no constipation   pregabalin  (LYRICA ) 100 MG capsule Take 1 capsule (100 mg total) by mouth at bedtime.   senna (SENOKOT) 8.6 MG TABS tablet Take 1 tablet by mouth every other day.   simvastatin  (ZOCOR ) 20 MG tablet Take 20 mg by mouth every evening. PM   sodium bicarbonate  650 MG tablet Take 650 mg by mouth every 8 (eight) hours as needed for heartburn.   valsartan (DIOVAN) 160 MG tablet Take 160 mg by mouth daily.   Vitamin D , Ergocalciferol , (DRISDOL ) 1.25 MG (50000 UNIT) CAPS capsule Take 50,000 Units by mouth every 7 (seven) days.   Wheat Dextrin (BENEFIBER ON THE GO) PACK Take 1 packet by mouth daily as needed.   No facility-administered encounter medications on file as of 09/20/2023.    Review of Systems  Immunization History   Administered Date(s) Administered   Influenza Inj Mdck Quad Pf 03/26/2016, 02/14/2018, 02/15/2019, 02/25/2021, 03/02/2022   Influenza Split 02/20/2015   Influenza, Seasonal, Injecte, Preservative Fre 04/13/2012, 03/17/2013   Influenza,inj,Quad PF,6+ Mos 03/07/2014, 02/20/2020   Influenza-Unspecified 03/26/2016, 02/04/2017, 02/14/2018, 02/25/2021, 04/12/2023   Moderna Sars-Covid-2 Vaccination 05/24/2019, 06/24/2019, 02/22/2020, 02/26/2022   PNEUMOCOCCAL CONJUGATE-20 04/09/2023   Pneumococcal Conjugate-13 09/01/2013   Pneumococcal Polysaccharide-23 04/13/2012   RSV,unspecified 04/09/2023   Td 08/07/2010   Tdap 08/07/2010   Zoster, Live 09/12/2007   Pertinent  Health Maintenance Due  Topic Date Due   FOOT EXAM  Never done   OPHTHALMOLOGY EXAM  Never done   INFLUENZA VACCINE  12/10/2023   HEMOGLOBIN A1C  12/19/2023   DEXA SCAN  Completed      01/09/2022   10:55 AM 02/18/2022    1:36 PM 03/18/2022    4:19 PM 03/18/2022    4:46 PM 05/13/2022   10:17 AM  Fall Risk  (RETIRED) Patient Fall Risk Level High fall risk High fall risk Moderate fall risk Moderate fall risk High fall risk   Functional Status Survey:    Vitals:   09/20/23 2034  BP: 128/60  Pulse: 84  Resp: 16  Temp: (!) 97.3 F (36.3 C)  SpO2: 95%  Weight: 152 lb 3.2 oz (69 kg)   Body mass index is 27.84 kg/m. Physical Exam Cardiovascular:     Rate and Rhythm: Normal rate.     Pulses: Normal pulses.  Pulmonary:     Effort: Pulmonary effort is normal.  Abdominal:     General: Abdomen is flat.     Palpations: Abdomen is soft.  Neurological:     Mental Status: She is alert. Mental status is at baseline.     Labs reviewed: Recent Labs    04/01/23 0519 04/12/23 0000 05/08/23 0402 05/09/23 1610 05/10/23 0210 05/11/23 0515 07/02/23 0504 07/03/23 0454 07/05/23 0000 07/08/23 0000 07/19/23 0000 08/27/23 1313  NA 138   < > 136 135 133*   < > 139 140   < > 142 136* 138  K 4.4   < > 3.7 3.8 4.0   < > 3.6  3.7   < > 4.1 4.0 3.9  CL 107   < > 108 106 105   < > 105 105   < > 105 101 101  CO2 21*   < > 19* 18* 19*   < > 25 25   < > 26* 27* 26  GLUCOSE 156*   < > 136* 119* 137*   < > 115* 113*  --   --   --  152*  BUN 39*   < > 31* 34* 39*   < > 27* 28*   < > 33* 47* 42*  CREATININE 1.58*   < > 1.31* 1.47* 1.56*   < > 1.58* 1.56*   < > 1.7* 1.9* 1.86*  CALCIUM  8.5*   < > 8.9 8.8* 8.6*   < > 9.1 9.5   < > 9.3 9.9 12.3*  MG 2.4  --  1.7 2.0  --   --   --   --   --   --   --   --   PHOS 3.0  --  3.3 3.4 2.9  --   --   --   --   --   --   --    < > = values in this interval not displayed.   Recent Labs    05/27/23 0909 06/29/23 0823 08/27/23 1313  AST 23 21 25   ALT 15 11 19   ALKPHOS 66 61 91  BILITOT 0.6 0.7 0.6  PROT 7.1 7.0 8.3*  ALBUMIN  3.7 3.5 4.1   Recent Labs    06/29/23 0823 06/30/23 0452 07/01/23 0510 07/08/23 0000 08/27/23 1313  WBC 10.4 7.8 7.1 7.2 9.0  NEUTROABS 6.9  --   --  3,262.00 4.9  HGB 10.0* 9.4* 9.6* 9.8* 12.3  HCT 29.8* 28.2* 28.4* 31* 36.9  MCV 92.3 91.3 91.0  --  90.0  PLT 234 235 233 253 240   Lab Results  Component Value Date   TSH 2.573 08/26/2020   Lab Results  Component Value Date   HGBA1C 7.7 06/21/2023   Lab Results  Component Value Date   CHOL 75 08/27/2022   HDL 22 (L) 08/27/2022   LDLCALC 34 08/27/2022   TRIG 146 05/08/2023   CHOLHDL 3.4 08/27/2022    Significant Diagnostic Results in last 30 days:  No results found.  Assessment/Plan Chronic pain Chronic pain is better controlled with the current medication regimen, including strong pain medications and diclofenac  gel used primarily at night. Posture may contribute to ribcage soreness. - Continue current pain medication regimen for a few months before considering dose reduction. - Encourage practice of good posture to alleviate ribcage soreness. - Review as needed medications with nursing staff to optimize pain management. - Discontinue baclofen at this time.   Vision  impairment Vision has worsened, potentially due to increased Lyrica  dosage, impacting reading and eating. Reducing the dosage may improve vision and reduce sedation. - Consider Lyrica  dose reduction from 100 mg to 75 mg to assess for improvement in vision and reduce sedation. Will defer given discontinuation of baclofen.   Muscle spasms Muscle spasms have improved with current treatment, with more relaxed muscles and calmed leg spasms. - Continue current regimen to maintain muscle relaxation. - Encourage continuation of efforts to strengthen leg muscles.  Type 2 diabetes mellitus with hyperglycemia Recent hemoglobin A1c was 8.0, indicating suboptimal glycemic control.  Chronic kidney disease, unspecified Kidney function is well-managed with ongoing nephrology follow-up.  Anemia, unspecified Hemoglobin level is 10.7, which is acceptable.  Family/ staff Communication: nursing  Labs/tests ordered:  CBC

## 2023-09-23 ENCOUNTER — Other Ambulatory Visit: Payer: Self-pay | Admitting: Nurse Practitioner

## 2023-09-23 ENCOUNTER — Encounter: Payer: Self-pay | Admitting: Nurse Practitioner

## 2023-09-23 ENCOUNTER — Non-Acute Institutional Stay (SKILLED_NURSING_FACILITY): Payer: Self-pay | Admitting: Nurse Practitioner

## 2023-09-23 DIAGNOSIS — E1122 Type 2 diabetes mellitus with diabetic chronic kidney disease: Secondary | ICD-10-CM | POA: Diagnosis not present

## 2023-09-23 DIAGNOSIS — F419 Anxiety disorder, unspecified: Secondary | ICD-10-CM

## 2023-09-23 DIAGNOSIS — I5031 Acute diastolic (congestive) heart failure: Secondary | ICD-10-CM | POA: Diagnosis not present

## 2023-09-23 DIAGNOSIS — M25552 Pain in left hip: Secondary | ICD-10-CM | POA: Diagnosis not present

## 2023-09-23 DIAGNOSIS — M1030 Gout due to renal impairment, unspecified site: Secondary | ICD-10-CM

## 2023-09-23 DIAGNOSIS — N184 Chronic kidney disease, stage 4 (severe): Secondary | ICD-10-CM

## 2023-09-23 DIAGNOSIS — D631 Anemia in chronic kidney disease: Secondary | ICD-10-CM

## 2023-09-23 DIAGNOSIS — N1832 Chronic kidney disease, stage 3b: Secondary | ICD-10-CM

## 2023-09-23 MED ORDER — LORAZEPAM 0.5 MG PO TABS: 0.5000 mg | ORAL_TABLET | Freq: Three times a day (TID) | ORAL | 0 refills | Status: DC | PRN

## 2023-09-23 NOTE — Assessment & Plan Note (Signed)
Euvolemic, continue current regimen

## 2023-09-23 NOTE — Assessment & Plan Note (Addendum)
 Consistent with bursitis Responsive to heat to left hip Continue heat TID -PT ordered for further evaluation  Discussed referral for injection but deferred at this time.

## 2023-09-23 NOTE — Assessment & Plan Note (Signed)
 A1c 8, at goal for age. Continue dietary modification

## 2023-09-23 NOTE — Assessment & Plan Note (Signed)
 Ongoing anxiety, making pain worse, she was doing well on ativan  PRN, getting 1-2 times daily but has expired on [facility] Will reorder daily ativan  0.25 mg daily and may have additional dose PRN

## 2023-09-23 NOTE — Assessment & Plan Note (Signed)
 Elevated uric acid level noted on last lab, allopurinol  increased to 150 mg daily Will recheck uric acid on next lab day

## 2023-09-23 NOTE — Assessment & Plan Note (Signed)
 Last hgb with improvement. Continue to montior

## 2023-09-23 NOTE — Progress Notes (Addendum)
 Location:  Other Nursing Home Room Number: [facility] 505A Place of Service:  SNF [phone])  Jann Melody, [place], MD  Patient Care Team: Valrie Gehrig, MD as PCP - General (Family Medicine) Gwyn Leos, MD as Consulting Physician (Internal Medicine) Marquita Situ, Magali Schmitz, MD as Consulting Physician (General Surgery) Arlette Benders, RN (Inactive) as Oncology Nurse Navigator Glenis Langdon, MD as Referring Physician (Radiation Oncology)  Extended Emergency Contact Information Primary Emergency Contact: [facility] Phone: [phone] Relation: Daughter Secondary Emergency Contact: Hicklin,Roxanne Mobile Phone: [phone] Relation: Niece  Goals of care: Advanced Directive information    09/23/2023   12:38 PM  Advanced Directives  Does Patient Have a Medical Advance Directive? Yes  Type of Advance Directive Out of facility DNR (pink MOST or yellow form)  Does patient want to make changes to medical advance directive? No - Patient declined     Chief Complaint  Patient presents with   Medical Management of Chronic Issues    Medical Management of Chronic Issues.     HPI:  Pt is a 89 y.o. female seen today for medical management of chronic disease.  Pt with hx of chronic pain, htn, anxiety and depression, constipation.  She is now long term at [facility] Niece visiting with her today and she is having increase pain to her left hip She went to neurologist yesterday for follow up.  Did not think any of her pain was neurologic  Bowel regimen well controlled.   Past Medical History:  Diagnosis Date   Arthritis    Gout   Chronic kidney disease    Diarrhea    Hypercholesteremia    Hypertension    Neuropathy    feet and lower legs   Seasonal allergies    Skin cancer    face   Past Surgical History:  Procedure Laterality Date   ABDOMINAL HYSTERECTOMY  2000   APPENDECTOMY     BREAST BIOPSY Right 09/19/2019   affirm bx of calcs UOQ, x marker, path  pending   BREAST BIOPSY Right 09/19/2019   us  bx of mass,heart marker, path pending   BREAST BIOPSY Right 09/19/2019   us  bx of LN, coil marker, path pending   CATARACT EXTRACTION Left    CATARACT EXTRACTION W/PHACO Right 10/24/2014   Procedure: CATARACT EXTRACTION PHACO AND INTRAOCULAR LENS PLACEMENT (IOC);  Surgeon: Annell Kidney, MD;  Location: [facility];  Service: Ophthalmology;  Laterality: Right;   ESOPHAGOGASTRODUODENOSCOPY N/A 03/07/2018   Procedure: ESOPHAGOGASTRODUODENOSCOPY (EGD);  Surgeon: Selena Daily, MD;  Location: [facility];  Service: Gastroenterology;  Laterality: N/A;   MASTECTOMY MODIFIED RADICAL Right 10/13/2019   Procedure: MASTECTOMY MODIFIED RADICAL;  Surgeon: Marshall Skeeter, MD;  Location: [facility] ORS;  Service: General;  Laterality: Right;   RENAL ANGIOGRAPHY Right 04/11/2018   Procedure: RENAL ANGIOGRAPHY;  Surgeon: Celso College, MD;  Location: [facility] INVASIVE CV LAB;  Service: Cardiovascular;  Laterality: Right;   SIMPLE MASTECTOMY WITH AXILLARY SENTINEL NODE BIOPSY Left 10/13/2019   Procedure: SIMPLE MASTECTOMY TRUE CUT BIOPSY, SENTINEL NODE BIOPSY;  Surgeon: Marshall Skeeter, MD;  Location: [facility] ORS;  Service: General;  Laterality: Left;    Allergies  Allergen Reactions   Penicillins Anaphylaxis    Patient tolerates ceftriaxone    Empagliflozin  Rash    Outpatient Encounter Medications as of 09/23/2023  Medication Sig   acetaminophen  (TYLENOL ) 650 MG CR tablet Take 650 mg by mouth 3 (three) times daily.   allopurinol  (ZYLOPRIM ) 100 MG tablet Take 150 mg by mouth  daily.   amLODipine  (NORVASC ) 10 MG tablet Take 10 mg by mouth at bedtime.   anastrozole  (ARIMIDEX ) 1 MG tablet Take 1 tablet (1 mg total) by mouth daily.   bismuth subsalicylate (PEPTO BISMOL) 262 MG/15ML suspension Take 30 mLs by mouth every 4 (four) hours as needed.   calcium  carbonate (OS-CAL - DOSED IN MG OF ELEMENTAL CALCIUM ) 1250 (500 Ca) MG tablet Take 2 tablets by mouth  daily with breakfast.   cholecalciferol  (VITAMIN D3) 25 MCG (1000 UNIT) tablet Take 1,000 Units by mouth daily.   cyanocobalamin  (VITAMIN B12) 1000 MCG tablet Take 1,000 mcg by mouth daily.   diclofenac  Sodium (VOLTAREN ) 1 % GEL Apply 2 g topically 2 (two) times daily. To bilateral legs.   docusate sodium  (COLACE) 100 MG capsule Take 100 mg by mouth daily as needed for mild constipation.   escitalopram (LEXAPRO) 10 MG tablet Take 10 mg by mouth daily.   furosemide  (LASIX ) 40 MG tablet Take 0.5 tablets (20 mg total) by mouth daily. Increase to 1 tablet (40 mg total) by mouth daily (total daily dose 40 mg) as needed for up to 3 days for increased leg swelling, shortness of breath, weight gain 5+ lbs over 1-2 days. Seek medical advice if these symptoms are not improving with increased dose.   guaifenesin  (ROBITUSSIN) 100 MG/5ML syrup Take 200 mg by mouth every 4 (four) hours as needed for cough.   hydrALAZINE  (APRESOLINE ) 50 MG tablet Take 1 tablet (50 mg total) by mouth 3 (three) times daily. Skip the dose if SBP less than 130 mmHg   lactose free nutrition (BOOST) LIQD Take 237 mLs by mouth 2 (two) times daily.   loratadine  (CLARITIN ) 10 MG tablet Take 10 mg by mouth daily as needed for allergies. AM   LORazepam  (ATIVAN ) 0.5 MG tablet Take 0.5 mg by mouth daily. Give one by mouth every 12 hours as needed.   melatonin 3 MG TABS tablet Take 3 mg by mouth at bedtime.   metoprolol  succinate (TOPROL -XL) 25 MG 24 hr tablet Take 1 tablet (25 mg total) by mouth daily.   Nutritional Supplements (,FEEDING SUPPLEMENT, PROSOURCE PLUS) liquid Take 30 mLs by mouth 2 (two) times daily between meals. For wound healing   OXYGEN Inhale into the lungs. 2lpm   polyethylene glycol (MIRALAX  / GLYCOLAX ) 17 g packet Take 17 g by mouth daily. Skip the dose if no constipation   pregabalin  (LYRICA ) 100 MG capsule Take 1 capsule (100 mg total) by mouth at bedtime.   senna (SENOKOT) 8.6 MG TABS tablet Take 1 tablet by mouth  every other day.   sodium bicarbonate  650 MG tablet Take 650 mg by mouth every 8 (eight) hours as needed for heartburn.   valsartan (DIOVAN) 160 MG tablet Take 160 mg by mouth daily.   Wheat Dextrin (BENEFIBER ON THE GO) PACK Take 1 packet by mouth daily as needed.   [DISCONTINUED] acetaminophen  (TYLENOL ) 325 MG tablet Take 1-2 tablets (325-650 mg total) by mouth every 6 (six) hours as needed for mild pain (pain score 1-3), moderate pain (pain score 4-6), fever or headache.   [DISCONTINUED] allopurinol  (ZYLOPRIM ) 100 MG tablet Take 100 mg by mouth daily.   [DISCONTINUED] citalopram  (CELEXA ) 10 MG tablet Take 10 mg by mouth daily.  (Patient not taking: Reported on 09/23/2023)   [DISCONTINUED] DULoxetine  (CYMBALTA ) 60 MG capsule Take 60 mg by mouth daily.   [DISCONTINUED] LORazepam  (ATIVAN ) 0.5 MG tablet Take 1 tablet (0.5 mg total) by mouth 3 (three) times  daily as needed for anxiety.   [DISCONTINUED] methocarbamol (ROBAXIN) 500 MG tablet Take 500 mg by mouth every 8 (eight) hours as needed for muscle spasms. (Patient not taking: Reported on 09/23/2023)   [DISCONTINUED] simvastatin  (ZOCOR ) 20 MG tablet Take 20 mg by mouth every evening. PM (Patient not taking: Reported on 09/23/2023)   [DISCONTINUED] Vitamin D , Ergocalciferol , (DRISDOL ) 1.25 MG (50000 UNIT) CAPS capsule Take 50,000 Units by mouth every 7 (seven) days. (Patient not taking: Reported on 09/23/2023)   No facility-administered encounter medications on file as of 09/23/2023.    Review of Systems  Constitutional:  Negative for activity change, appetite change, fatigue and unexpected weight change.  HENT:  Negative for congestion and hearing loss.   Eyes: Negative.   Respiratory:  Negative for cough and shortness of breath.   Cardiovascular:  Negative for chest pain, palpitations and leg swelling.  Gastrointestinal:  Negative for abdominal pain, constipation and diarrhea.  Genitourinary:  Negative for difficulty urinating and dysuria.   Musculoskeletal:  Positive for arthralgias, gait problem and myalgias.  Skin:  Negative for color change and wound.  Neurological:  Negative for dizziness and weakness.  Psychiatric/Behavioral:  Negative for agitation, behavioral problems and confusion. The patient is nervous/anxious.      Immunization History  Administered Date(s) Administered   Influenza Inj Mdck Quad Pf 03/26/2016, 02/14/2018, 02/15/2019, 02/25/2021, 03/02/2022   Influenza Split 02/20/2015   Influenza, Seasonal, Injecte, Preservative Fre 04/13/2012, 03/17/2013   Influenza,inj,Quad PF,6+ Mos 03/07/2014, 02/20/2020   Influenza-Unspecified 03/26/2016, 02/04/2017, 02/14/2018, 02/25/2021, 04/12/2023   Moderna Sars-Covid-2 Vaccination 05/24/2019, 06/24/2019, 02/22/2020, 02/26/2022   PNEUMOCOCCAL CONJUGATE-20 04/09/2023   Pneumococcal Conjugate-13 09/01/2013   Pneumococcal Polysaccharide-23 04/13/2012   RSV,unspecified 04/09/2023   Td 08/07/2010   Tdap 08/07/2010   Zoster, Live 09/12/2007   Pertinent  Health Maintenance Due  Topic Date Due   FOOT EXAM  Never done   OPHTHALMOLOGY EXAM  Never done   INFLUENZA VACCINE  12/10/2023   HEMOGLOBIN A1C  03/15/2024   DEXA SCAN  Completed      01/09/2022   10:55 AM 02/18/2022    1:36 PM 03/18/2022    4:19 PM 03/18/2022    4:46 PM 05/13/2022   10:17 AM  Fall Risk  (RETIRED) Patient Fall Risk Level High fall risk High fall risk Moderate fall risk Moderate fall risk High fall risk   Functional Status Survey:    Vitals:   09/23/23 1156  BP: 135/66  Pulse: 72  Resp: 16  Temp: (!) 97.3 F (36.3 C)  SpO2: 95%  Weight: 159 lb 6.4 oz (72.3 kg)  Height: 5\' 2"  (1.575 m)   Body mass index is 29.15 kg/m. Physical Exam Constitutional:      General: She is not in acute distress.    Appearance: She is well-developed. She is not diaphoretic.  HENT:     Head: Normocephalic and atraumatic.     Mouth/Throat:     Pharynx: No oropharyngeal exudate.  Eyes:      Conjunctiva/sclera: Conjunctivae normal.     Pupils: Pupils are equal, round, and reactive to light.  Cardiovascular:     Rate and Rhythm: Normal rate and regular rhythm.     Heart sounds: Normal heart sounds.  Pulmonary:     Effort: Pulmonary effort is normal.     Breath sounds: Normal breath sounds.  Abdominal:     General: Bowel sounds are normal.     Palpations: Abdomen is soft.  Musculoskeletal:     Cervical back:  Normal range of motion and neck supple.     Right hip: Normal range of motion.     Left hip: Normal range of motion.     Right upper leg: No tenderness.     Left upper leg: Tenderness present.     Right lower leg: No edema.     Left lower leg: No edema.       Legs:  Skin:    General: Skin is warm and dry.  Neurological:     Mental Status: She is alert.  Psychiatric:        Mood and Affect: Mood normal.     Labs reviewed: Recent Labs    04/01/23 0519 04/12/23 0000 05/08/23 0402 05/09/23 0436 05/10/23 0210 05/11/23 0515 07/02/23 0504 07/03/23 0454 07/05/23 0000 07/19/23 0000 08/27/23 1313 09/06/23 0000 09/09/23 0000  NA 138   < > 136 135 133*   < > 139 140   < > 136* 138  --  141  K 4.4   < > 3.7 3.8 4.0   < > 3.6 3.7   < > 4.0 3.9  --  4.7  CL 107   < > 108 106 105   < > 105 105   < > 101 101  --  112*  CO2 21*   < > 19* 18* 19*   < > 25 25   < > 27* 26  --  21  GLUCOSE 156*   < > 136* 119* 137*   < > 115* 113*  --   --  152*  --   --   BUN 39*   < > 31* 34* 39*   < > 27* 28*   < > 47* 42*  --  28*  CREATININE 1.58*   < > 1.31* 1.47* 1.56*   < > 1.58* 1.56*   < > 1.9* 1.86*  --  1.4*  CALCIUM  8.5*   < > 8.9 8.8* 8.6*   < > 9.1 9.5   < > 9.9 12.3* 8.0* 8.7  MG 2.4  --  1.7 2.0  --   --   --   --   --   --   --   --   --   PHOS 3.0  --  3.3 3.4 2.9  --   --   --   --   --   --   --   --    < > = values in this interval not displayed.   Recent Labs    05/27/23 0909 06/29/23 0823 08/27/23 1313 09/09/23 0000  AST 23 21 25 18   ALT 15 11 19 10    ALKPHOS 66 61 91 72  BILITOT 0.6 0.7 0.6  --   PROT 7.1 7.0 8.3*  --   ALBUMIN  3.7 3.5 4.1 3.9   Recent Labs    06/29/23 0823 06/30/23 0452 07/01/23 0510 07/08/23 0000 08/27/23 1313 09/09/23 0000  WBC 10.4 7.8 7.1 7.2 9.0 7.5  NEUTROABS 6.9  --   --  3,262.00 4.9  --   HGB 10.0* 9.4* 9.6* 9.8* 12.3 10.7*  HCT 29.8* 28.2* 28.4* 31* 36.9 34*  MCV 92.3 91.3 91.0  --  90.0  --   PLT 234 235 233 253 240 222   Lab Results  Component Value Date   TSH 2.573 08/26/2020   Lab Results  Component Value Date   HGBA1C 8.0 09/13/2023   Lab Results  Component Value Date  CHOL 75 08/27/2022   HDL 22 (L) 08/27/2022   LDLCALC 34 08/27/2022   TRIG 146 05/08/2023   CHOLHDL 3.4 08/27/2022    Significant Diagnostic Results in last 30 days:  No results found.  Assessment/Plan Anemia of chronic renal failure, stage 3b (HCC) Last hgb with improvement. Continue to montior  Anxiety Ongoing anxiety, making pain worse, she was doing well on ativan  PRN, getting 1-2 times daily but has expired on MAR and now symptoms are worse. She did not have side effects Will reorder daily ativan  0.25 mg daily and may have additional dose PRN for 6 months so she does not go without if needed  CHF NYHA class III, acute, diastolic (HCC) Euvolemic, continue current regimen  Gout Elevated uric acid level noted on last lab, allopurinol  increased to 150 mg daily Will recheck uric acid on next lab day  Left hip pain Consistent with bursitis Responsive to heat to left hip Continue heat TID -PT ordered for further evaluation  Discussed referral for injection but deferred at this time.   Type II diabetes mellitus with renal manifestations (HCC) A1c 8, at goal for age. Continue dietary modification   Pt and family member (niece) were informed of the potential risks and benefits of starting/increasing the dose of psychotropic medications, in accordance with CMS guidelines for informed consent.   The  discussion included the following key points: Purpose of Medication: Explanation of the specific psychiatric condition being treated (e.g., depression, anxiety, agitation, end of life care) and how the medication is expected to improve the patient's symptoms. Risks: A detailed review of the potential side effects of the medication(s), including but not limited to sedation, cognitive decline, falls, weight gain, or changes in behavior. The risks of overmedication or inappropriate dosing, as well as the potential for drug interactions, were discussed. Benefits: The potential benefits of the medication(s) were explained, including symptom relief, improved quality of life, and possible reduction in distress or behavioral problems. It was emphasized that benefits may vary and are not guaranteed. Alternative Approaches: Other non-pharmacological treatments or alternative medications, if applicable, were reviewed, and the patient/family was given the opportunity to ask questions and express preferences. Monitoring Plan: The need for close monitoring of medication effects, including follow-up appointments, lab tests, and behavioral assessments, was outlined. Family members were made aware of how the patient's progress will be tracked. Voluntary Participation: It was emphasized that the decision to start or increase the medication is voluntary and the family has the right to withdraw consent at any time without penalty. The family was encouraged to ask questions and seek further clarification as needed.   The family acknowledged understanding the risks, benefits, and alternatives discussed and gave verbal consent to proceed with the treatment plan. Written consent will be obtained by facility administrative staff.   Tina Curtis K. Denney Fisherman [facility] [phone]

## 2023-09-23 NOTE — Progress Notes (Deleted)
 Location:  Other [place].  Nursing Home Room Number: [facility] 505A Place of Service:  SNF [phone]) Gilbert Lab, NP  PCP: Valrie Gehrig, MD  Patient Care Team: Valrie Gehrig, MD as PCP - General (Family Medicine) Gwyn Leos, MD as Consulting Physician (Internal Medicine) Marquita Situ Magali Schmitz, MD as Consulting Physician (General Surgery) Arlette Benders, RN (Inactive) as Oncology Nurse Navigator Glenis Langdon, MD as Referring Physician (Radiation Oncology)  Extended Emergency Contact Information Primary Emergency Contact: [facility] Phone: [phone] Relation: Daughter Secondary Emergency Contact: Hicklin,Roxanne Mobile Phone: [phone] Relation: Niece  Goals of care: Advanced Directive information    09/23/2023   12:38 PM  Advanced Directives  Does Patient Have a Medical Advance Directive? Yes  Type of Advance Directive Out of facility DNR (pink MOST or yellow form)  Does patient want to make changes to medical advance directive? No - Patient declined     Chief Complaint  Patient presents with   Medical Management of Chronic Issues    Medical Management of Chronic Issues.     HPI:  Pt is a 89 y.o. female seen today for medical management of chronic disease.    Past Medical History:  Diagnosis Date   Arthritis    Gout   Chronic kidney disease    Diarrhea    Hypercholesteremia    Hypertension    Neuropathy    feet and lower legs   Seasonal allergies    Skin cancer    face   Past Surgical History:  Procedure Laterality Date   ABDOMINAL HYSTERECTOMY  2000   APPENDECTOMY     BREAST BIOPSY Right 09/19/2019   affirm bx of calcs UOQ, x marker, path pending   BREAST BIOPSY Right 09/19/2019   us  bx of mass,heart marker, path pending   BREAST BIOPSY Right 09/19/2019   us  bx of LN, coil marker, path pending   CATARACT EXTRACTION Left    CATARACT EXTRACTION W/PHACO Right 10/24/2014   Procedure: CATARACT EXTRACTION PHACO AND  INTRAOCULAR LENS PLACEMENT (IOC);  Surgeon: Annell Kidney, MD;  Location: [facility];  Service: Ophthalmology;  Laterality: Right;   ESOPHAGOGASTRODUODENOSCOPY N/A 03/07/2018   Procedure: ESOPHAGOGASTRODUODENOSCOPY (EGD);  Surgeon: Selena Daily, MD;  Location: [facility] ENDOSCOPY;  Service: Gastroenterology;  Laterality: N/A;   MASTECTOMY MODIFIED RADICAL Right 10/13/2019   Procedure: MASTECTOMY MODIFIED RADICAL;  Surgeon: Marshall Skeeter, MD;  Location: [facility] ORS;  Service: General;  Laterality: Right;   RENAL ANGIOGRAPHY Right 04/11/2018   Procedure: RENAL ANGIOGRAPHY;  Surgeon: Celso College, MD;  Location: [facility] INVASIVE CV LAB;  Service: Cardiovascular;  Laterality: Right;   SIMPLE MASTECTOMY WITH AXILLARY SENTINEL NODE BIOPSY Left 10/13/2019   Procedure: SIMPLE MASTECTOMY TRUE CUT BIOPSY, SENTINEL NODE BIOPSY;  Surgeon: Marshall Skeeter, MD;  Location: [facility] ORS;  Service: General;  Laterality: Left;    Allergies  Allergen Reactions   Penicillins Anaphylaxis    Patient tolerates ceftriaxone    Empagliflozin  Rash    Outpatient Encounter Medications as of 09/23/2023  Medication Sig   acetaminophen  (TYLENOL ) 650 MG CR tablet Take 650 mg by mouth 3 (three) times daily.   allopurinol  (ZYLOPRIM ) 100 MG tablet Take 150 mg by mouth daily.   amLODipine  (NORVASC ) 10 MG tablet Take 10 mg by mouth at bedtime.   anastrozole  (ARIMIDEX ) 1 MG tablet Take 1 tablet (1 mg total) by mouth daily.   bismuth subsalicylate (PEPTO BISMOL) 262 MG/15ML suspension Take 30 mLs by mouth every 4 (four) hours as needed.  calcium  carbonate (OS-CAL - DOSED IN MG OF ELEMENTAL CALCIUM ) 1250 (500 Ca) MG tablet Take 2 tablets by mouth daily with breakfast.   cholecalciferol  (VITAMIN D3) 25 MCG (1000 UNIT) tablet Take 1,000 Units by mouth daily.   cyanocobalamin  (VITAMIN B12) 1000 MCG tablet Take 1,000 mcg by mouth daily.   diclofenac  Sodium (VOLTAREN ) 1 % GEL Apply 2 g topically 2 (two) times daily. To  bilateral legs.   docusate sodium  (COLACE) 100 MG capsule Take 100 mg by mouth daily as needed for mild constipation.   escitalopram (LEXAPRO) 10 MG tablet Take 10 mg by mouth daily.   furosemide  (LASIX ) 40 MG tablet Take 0.5 tablets (20 mg total) by mouth daily. Increase to 1 tablet (40 mg total) by mouth daily (total daily dose 40 mg) as needed for up to 3 days for increased leg swelling, shortness of breath, weight gain 5+ lbs over 1-2 days. Seek medical advice if these symptoms are not improving with increased dose.   guaifenesin  (ROBITUSSIN) 100 MG/5ML syrup Take 200 mg by mouth every 4 (four) hours as needed for cough.   hydrALAZINE  (APRESOLINE ) 50 MG tablet Take 1 tablet (50 mg total) by mouth 3 (three) times daily. Skip the dose if SBP less than 130 mmHg   lactose free nutrition (BOOST) LIQD Take 237 mLs by mouth 2 (two) times daily.   loratadine  (CLARITIN ) 10 MG tablet Take 10 mg by mouth daily as needed for allergies. AM   LORazepam  (ATIVAN ) 0.5 MG tablet Take 0.5 mg by mouth daily. Give one by mouth every 12 hours as needed.   melatonin 3 MG TABS tablet Take 3 mg by mouth at bedtime.   metoprolol  succinate (TOPROL -XL) 25 MG 24 hr tablet Take 1 tablet (25 mg total) by mouth daily.   Nutritional Supplements (,FEEDING SUPPLEMENT, PROSOURCE PLUS) liquid Take 30 mLs by mouth 2 (two) times daily between meals. For wound healing   OXYGEN Inhale into the lungs. 2lpm   polyethylene glycol (MIRALAX  / GLYCOLAX ) 17 g packet Take 17 g by mouth daily. Skip the dose if no constipation   pregabalin  (LYRICA ) 100 MG capsule Take 1 capsule (100 mg total) by mouth at bedtime.   senna (SENOKOT) 8.6 MG TABS tablet Take 1 tablet by mouth every other day.   sodium bicarbonate  650 MG tablet Take 650 mg by mouth every 8 (eight) hours as needed for heartburn.   valsartan (DIOVAN) 160 MG tablet Take 160 mg by mouth daily.   Wheat Dextrin (BENEFIBER ON THE GO) PACK Take 1 packet by mouth daily as needed.    citalopram  (CELEXA ) 10 MG tablet Take 10 mg by mouth daily.  (Patient not taking: Reported on 09/23/2023)   methocarbamol (ROBAXIN) 500 MG tablet Take 500 mg by mouth every 8 (eight) hours as needed for muscle spasms. (Patient not taking: Reported on 09/23/2023)   simvastatin  (ZOCOR ) 20 MG tablet Take 20 mg by mouth every evening. PM (Patient not taking: Reported on 09/23/2023)   Vitamin D , Ergocalciferol , (DRISDOL ) 1.25 MG (50000 UNIT) CAPS capsule Take 50,000 Units by mouth every 7 (seven) days. (Patient not taking: Reported on 09/23/2023)   [DISCONTINUED] acetaminophen  (TYLENOL ) 325 MG tablet Take 1-2 tablets (325-650 mg total) by mouth every 6 (six) hours as needed for mild pain (pain score 1-3), moderate pain (pain score 4-6), fever or headache.   [DISCONTINUED] allopurinol  (ZYLOPRIM ) 100 MG tablet Take 100 mg by mouth daily.   [DISCONTINUED] DULoxetine  (CYMBALTA ) 60 MG capsule Take 60 mg by  mouth daily.   [DISCONTINUED] LORazepam  (ATIVAN ) 0.5 MG tablet Take 1 tablet (0.5 mg total) by mouth 3 (three) times daily as needed for anxiety.   No facility-administered encounter medications on file as of 09/23/2023.    Review of Systems ***  Immunization History  Administered Date(s) Administered   Influenza Inj Mdck Quad Pf 03/26/2016, 02/14/2018, 02/15/2019, 02/25/2021, 03/02/2022   Influenza Split 02/20/2015   Influenza, Seasonal, Injecte, Preservative Fre 04/13/2012, 03/17/2013   Influenza,inj,Quad PF,6+ Mos 03/07/2014, 02/20/2020   Influenza-Unspecified 03/26/2016, 02/04/2017, 02/14/2018, 02/25/2021, 04/12/2023   Moderna Sars-Covid-2 Vaccination 05/24/2019, 06/24/2019, 02/22/2020, 02/26/2022   PNEUMOCOCCAL CONJUGATE-20 04/09/2023   Pneumococcal Conjugate-13 09/01/2013   Pneumococcal Polysaccharide-23 04/13/2012   RSV,unspecified 04/09/2023   Td 08/07/2010   Tdap 08/07/2010   Zoster, Live 09/12/2007   Pertinent  Health Maintenance Due  Topic Date Due   FOOT EXAM  Never done    OPHTHALMOLOGY EXAM  Never done   INFLUENZA VACCINE  12/10/2023   HEMOGLOBIN A1C  03/15/2024   DEXA SCAN  Completed      01/09/2022   10:55 AM 02/18/2022    1:36 PM 03/18/2022    4:19 PM 03/18/2022    4:46 PM 05/13/2022   10:17 AM  Fall Risk  (RETIRED) Patient Fall Risk Level High fall risk High fall risk Moderate fall risk Moderate fall risk High fall risk   Functional Status Survey:    Vitals:   09/23/23 1156  BP: 135/66  Pulse: 72  Resp: 16  Temp: (!) 97.3 F (36.3 C)  SpO2: 95%  Weight: 159 lb 6.4 oz (72.3 kg)  Height: 5\' 2"  (1.575 m)   Body mass index is 29.15 kg/m. Physical Exam***  Labs reviewed: Recent Labs    04/01/23 0519 04/12/23 0000 05/08/23 0402 05/09/23 0436 05/10/23 0210 05/11/23 0515 07/02/23 0504 07/03/23 0454 07/05/23 0000 07/19/23 0000 08/27/23 1313 09/06/23 0000 09/09/23 0000  NA 138   < > 136 135 133*   < > 139 140   < > 136* 138  --  141  K 4.4   < > 3.7 3.8 4.0   < > 3.6 3.7   < > 4.0 3.9  --  4.7  CL 107   < > 108 106 105   < > 105 105   < > 101 101  --  112*  CO2 21*   < > 19* 18* 19*   < > 25 25   < > 27* 26  --  21  GLUCOSE 156*   < > 136* 119* 137*   < > 115* 113*  --   --  152*  --   --   BUN 39*   < > 31* 34* 39*   < > 27* 28*   < > 47* 42*  --  28*  CREATININE 1.58*   < > 1.31* 1.47* 1.56*   < > 1.58* 1.56*   < > 1.9* 1.86*  --  1.4*  CALCIUM  8.5*   < > 8.9 8.8* 8.6*   < > 9.1 9.5   < > 9.9 12.3* 8.0* 8.7  MG 2.4  --  1.7 2.0  --   --   --   --   --   --   --   --   --   PHOS 3.0  --  3.3 3.4 2.9  --   --   --   --   --   --   --   --    < > =  values in this interval not displayed.   Recent Labs    05/27/23 0909 06/29/23 0823 08/27/23 1313 09/09/23 0000  AST 23 21 25 18   ALT 15 11 19 10   ALKPHOS 66 61 91 72  BILITOT 0.6 0.7 0.6  --   PROT 7.1 7.0 8.3*  --   ALBUMIN  3.7 3.5 4.1 3.9   Recent Labs    06/29/23 0823 06/30/23 0452 07/01/23 0510 07/08/23 0000 08/27/23 1313 09/09/23 0000  WBC 10.4 7.8 7.1 7.2 9.0 7.5   NEUTROABS 6.9  --   --  3,262.00 4.9  --   HGB 10.0* 9.4* 9.6* 9.8* 12.3 10.7*  HCT 29.8* 28.2* 28.4* 31* 36.9 34*  MCV 92.3 91.3 91.0  --  90.0  --   PLT 234 235 233 253 240 222   Lab Results  Component Value Date   TSH 2.573 08/26/2020   Lab Results  Component Value Date   HGBA1C 8.0 09/13/2023   Lab Results  Component Value Date   CHOL 75 08/27/2022   HDL 22 (L) 08/27/2022   LDLCALC 34 08/27/2022   TRIG 146 05/08/2023   CHOLHDL 3.4 08/27/2022    Significant Diagnostic Results in last 30 days:  No results found.  Assessment/Plan Anemia of chronic renal failure, stage 3b (HCC) Last hgb with improvement. Continue to montior  Anxiety Ongoing anxiety, making pain worse, she was doing well on ativan  PRN, getting 1-2 times daily but has expired on [facility] Will reorder daily ativan  0.25 mg daily and may have additional dose PRN  CHF NYHA class III, acute, diastolic (HCC) Euvolemic, continue current regimen  Gout Elevated uric acid level noted on last lab, allopurinol  increased to 150 mg daily Will recheck uric acid on next lab day  Left hip pain Showing signs of bursitis Responsive to heat to left hip Continue heating TID -PT ordered for further evaluation    Type II diabetes mellitus with renal manifestations (HCC) A1c 8, at goal for age. Continue dietary modification    Shon Indelicato K. Denney Fisherman [facility] [phone]

## 2023-09-28 ENCOUNTER — Encounter (INDEPENDENT_AMBULATORY_CARE_PROVIDER_SITE_OTHER): Payer: Self-pay

## 2023-09-30 ENCOUNTER — Non-Acute Institutional Stay (SKILLED_NURSING_FACILITY): Payer: Self-pay | Admitting: Adult Health

## 2023-09-30 ENCOUNTER — Encounter: Payer: Self-pay | Admitting: Adult Health

## 2023-09-30 DIAGNOSIS — I5032 Chronic diastolic (congestive) heart failure: Secondary | ICD-10-CM | POA: Diagnosis not present

## 2023-09-30 DIAGNOSIS — R1011 Right upper quadrant pain: Secondary | ICD-10-CM

## 2023-09-30 DIAGNOSIS — I150 Renovascular hypertension: Secondary | ICD-10-CM | POA: Diagnosis not present

## 2023-09-30 NOTE — Progress Notes (Signed)
 Location:  Other [facility]) Nursing Home Room Number: [place] [address] [facility]) Place of Service:  SNF (31) Provider:  Medina-Vargas, Lashonne Shull, DNP, FNP-BC  Patient Care Team: Valrie Gehrig, MD as PCP - General (Family Medicine) Gwyn Leos, MD as Consulting Physician (Internal Medicine) Marquita Situ Magali Schmitz, MD as Consulting Physician (General Surgery) Arlette Benders, RN (Inactive) as Oncology Nurse Navigator Glenis Langdon, MD as Referring Physician (Radiation Oncology)  Extended Emergency Contact Information Primary Emergency Contact: [facility] Phone: [phone] Relation: Daughter Secondary Emergency Contact: Hicklin,Roxanne Mobile Phone: [phone] Relation: Niece  Code Status:  DNR  Goals of care: Advanced Directive information    09/30/2023    2:35 PM  Advanced Directives  Does Patient Have a Medical Advance Directive? Yes  Type of Advance Directive Out of facility DNR (pink MOST or yellow form)  Does patient want to make changes to medical advance directive? No - Patient declined     Chief Complaint  Patient presents with   Abdominal Pain    upper right region of the abdomen.    HPI:  Pt is a 89 y.o. female seen today for an acute visit regarding pain on her RUQ. She is a resident of [address] [place] ([place]). Patient stated that it hurts when pressure is applied such as her pant waist band. Recent liver enzymes were within normal levels. No reported trauma. She takes Tylenol  650 mg TID for pain.  BP 147/76, currently taking hydralazine  50 mg 3 times a day, metoprolol  succinate 25 mg daily and valsartan 160 mg daily for hypertension.  No noted shortness of breath.  She takes furosemide  20 mg daily for CHF.  Past Medical History:  Diagnosis Date   Arthritis    Gout   Chronic kidney disease    Diarrhea    Hypercholesteremia    Hypertension    Neuropathy    feet and lower legs   Seasonal allergies    Skin cancer     face   Past Surgical History:  Procedure Laterality Date   ABDOMINAL HYSTERECTOMY  2000   APPENDECTOMY     BREAST BIOPSY Right 09/19/2019   affirm bx of calcs UOQ, x marker, path pending   BREAST BIOPSY Right 09/19/2019   us  bx of mass,heart marker, path pending   BREAST BIOPSY Right 09/19/2019   us  bx of LN, coil marker, path pending   CATARACT EXTRACTION Left    CATARACT EXTRACTION W/PHACO Right 10/24/2014   Procedure: CATARACT EXTRACTION PHACO AND INTRAOCULAR LENS PLACEMENT (IOC);  Surgeon: Annell Kidney, MD;  Location: [facility];  Service: Ophthalmology;  Laterality: Right;   ESOPHAGOGASTRODUODENOSCOPY N/A 03/07/2018   Procedure: ESOPHAGOGASTRODUODENOSCOPY (EGD);  Surgeon: Selena Daily, MD;  Location: [facility] ENDOSCOPY;  Service: Gastroenterology;  Laterality: N/A;   MASTECTOMY MODIFIED RADICAL Right 10/13/2019   Procedure: MASTECTOMY MODIFIED RADICAL;  Surgeon: Marshall Skeeter, MD;  Location: [facility] ORS;  Service: General;  Laterality: Right;   RENAL ANGIOGRAPHY Right 04/11/2018   Procedure: RENAL ANGIOGRAPHY;  Surgeon: Celso College, MD;  Location: [facility] INVASIVE CV LAB;  Service: Cardiovascular;  Laterality: Right;   SIMPLE MASTECTOMY WITH AXILLARY SENTINEL NODE BIOPSY Left 10/13/2019   Procedure: SIMPLE MASTECTOMY TRUE CUT BIOPSY, SENTINEL NODE BIOPSY;  Surgeon: Marshall Skeeter, MD;  Location: [facility] ORS;  Service: General;  Laterality: Left;    Allergies  Allergen Reactions   Penicillins Anaphylaxis    Patient tolerates ceftriaxone    Empagliflozin  Rash    Outpatient Encounter Medications as  of 09/30/2023  Medication Sig   acetaminophen  (TYLENOL ) 650 MG CR tablet Take 650 mg by mouth 3 (three) times daily.   allopurinol  (ZYLOPRIM ) 100 MG tablet Take 150 mg by mouth daily.   Alpha-D-Galactosidase (GAS-X PREVENTION PO) Take 1 capsule by mouth every 8 (eight) hours as needed.   amLODipine  (NORVASC ) 10 MG tablet Take 10 mg by mouth at bedtime.   anastrozole   (ARIMIDEX ) 1 MG tablet Take 1 tablet (1 mg total) by mouth daily.   bismuth subsalicylate (PEPTO BISMOL) 262 MG/15ML suspension Take 30 mLs by mouth every 4 (four) hours as needed.   calcium  carbonate (OS-CAL - DOSED IN MG OF ELEMENTAL CALCIUM ) 1250 (500 Ca) MG tablet Take 2 tablets by mouth daily with breakfast.   cholecalciferol  (VITAMIN D3) 25 MCG (1000 UNIT) tablet Take 1,000 Units by mouth daily.   cyanocobalamin  (VITAMIN B12) 1000 MCG tablet Take 1,000 mcg by mouth daily.   diclofenac  Sodium (VOLTAREN ) 1 % GEL Apply 2 g topically 2 (two) times daily. To bilateral legs.   docusate sodium  (COLACE) 100 MG capsule Take 100 mg by mouth daily as needed for mild constipation.   escitalopram (LEXAPRO) 10 MG tablet Take 10 mg by mouth daily.   furosemide  (LASIX ) 40 MG tablet Take 0.5 tablets (20 mg total) by mouth daily. Increase to 1 tablet (40 mg total) by mouth daily (total daily dose 40 mg) as needed for up to 3 days for increased leg swelling, shortness of breath, weight gain 5+ lbs over 1-2 days. Seek medical advice if these symptoms are not improving with increased dose.   guaifenesin  (ROBITUSSIN) 100 MG/5ML syrup Take 200 mg by mouth every 4 (four) hours as needed for cough.   hydrALAZINE  (APRESOLINE ) 50 MG tablet Take 1 tablet (50 mg total) by mouth 3 (three) times daily. Skip the dose if SBP less than 130 mmHg   loratadine  (CLARITIN ) 10 MG tablet Take 10 mg by mouth daily as needed for allergies. AM   LORazepam  (ATIVAN ) 0.5 MG tablet Take 0.5 mg by mouth daily. Give one by mouth every 12 hours as needed.   melatonin 3 MG TABS tablet Take 3 mg by mouth at bedtime.   metoprolol  succinate (TOPROL -XL) 25 MG 24 hr tablet Take 1 tablet (25 mg total) by mouth daily.   OXYGEN Inhale into the lungs. 2lpm   polyethylene glycol (MIRALAX  / GLYCOLAX ) 17 g packet Take 17 g by mouth daily. Skip the dose if no constipation   pregabalin  (LYRICA ) 100 MG capsule Take 1 capsule (100 mg total) by mouth at  bedtime.   senna (SENOKOT) 8.6 MG TABS tablet Take 1 tablet by mouth every other day.   sodium bicarbonate  650 MG tablet Take 650 mg by mouth every 8 (eight) hours as needed for heartburn.   valsartan (DIOVAN) 160 MG tablet Take 160 mg by mouth daily.   Wheat Dextrin (BENEFIBER ON THE GO) PACK Take 1 packet by mouth daily as needed.   lactose free nutrition (BOOST) LIQD Take 237 mLs by mouth 2 (two) times daily. (Patient not taking: Reported on 09/30/2023)   Nutritional Supplements (,FEEDING SUPPLEMENT, PROSOURCE PLUS) liquid Take 30 mLs by mouth 2 (two) times daily between meals. For wound healing (Patient not taking: Reported on 09/30/2023)   No facility-administered encounter medications on file as of 09/30/2023.    Review of Systems  Unable to obtain due to being sleepy.    Immunization History  Administered Date(s) Administered   Influenza Inj Mdck Quad Pf  03/26/2016, 02/14/2018, 02/15/2019, 02/25/2021, 03/02/2022   Influenza Split 02/20/2015   Influenza, Seasonal, Injecte, Preservative Fre 04/13/2012, 03/17/2013   Influenza,inj,Quad PF,6+ Mos 03/07/2014, 02/20/2020   Influenza-Unspecified 03/26/2016, 02/04/2017, 02/14/2018, 02/25/2021, 04/12/2023   Moderna Sars-Covid-2 Vaccination 05/24/2019, 06/24/2019, 02/22/2020, 02/26/2022   PNEUMOCOCCAL CONJUGATE-20 04/09/2023   Pneumococcal Conjugate-13 09/01/2013   Pneumococcal Polysaccharide-23 04/13/2012   RSV,unspecified 04/09/2023   Td 08/07/2010   Tdap 08/07/2010   Zoster, Live 09/12/2007   Pertinent  Health Maintenance Due  Topic Date Due   FOOT EXAM  Never done   OPHTHALMOLOGY EXAM  Never done   INFLUENZA VACCINE  12/10/2023   HEMOGLOBIN A1C  03/15/2024   DEXA SCAN  Completed      01/09/2022   10:55 AM 02/18/2022    1:36 PM 03/18/2022    4:19 PM 03/18/2022    4:46 PM 05/13/2022   10:17 AM  Fall Risk  (RETIRED) Patient Fall Risk Level High fall risk High fall risk Moderate fall risk Moderate fall risk High fall risk      Vitals:   09/30/23 1416  BP: (!) 147/76  Pulse: 72  Resp: 16  Temp: (!) 97.3 F (36.3 C)  SpO2: 95%  Weight: 160 lb 3.2 oz (72.7 kg)  Height: 5\' 2"  (1.575 m)   Body mass index is 29.3 kg/m.  Physical Exam Constitutional:      General: She is not in acute distress.    Appearance: Normal appearance.  HENT:     Head: Normocephalic and atraumatic.     Nose: Nose normal.     Mouth/Throat:     Mouth: Mucous membranes are moist.  Eyes:     Conjunctiva/sclera: Conjunctivae normal.  Cardiovascular:     Rate and Rhythm: Normal rate and regular rhythm.  Pulmonary:     Effort: Pulmonary effort is normal.     Breath sounds: Normal breath sounds.  Abdominal:     General: Bowel sounds are normal.     Palpations: Abdomen is soft.  Genitourinary:    Uterus: Not enlarged.   Musculoskeletal:        General: Normal range of motion.     Cervical back: Normal range of motion.  Skin:    General: Skin is warm and dry.  Neurological:     Mental Status: She is alert and oriented to person, place, and time.  Psychiatric:        Mood and Affect: Mood normal.        Behavior: Behavior normal.        Thought Content: Thought content normal.        Judgment: Judgment normal.      Labs reviewed: Recent Labs    04/01/23 0519 04/12/23 0000 05/08/23 0402 05/09/23 0436 05/10/23 0210 05/11/23 0515 07/02/23 0504 07/03/23 0454 07/05/23 0000 07/19/23 0000 08/27/23 1313 09/06/23 0000 09/09/23 0000  NA 138   < > 136 135 133*   < > 139 140   < > 136* 138  --  141  K 4.4   < > 3.7 3.8 4.0   < > 3.6 3.7   < > 4.0 3.9  --  4.7  CL 107   < > 108 106 105   < > 105 105   < > 101 101  --  112*  CO2 21*   < > 19* 18* 19*   < > 25 25   < > 27* 26  --  21  GLUCOSE 156*   < > 136*  119* 137*   < > 115* 113*  --   --  152*  --   --   BUN 39*   < > 31* 34* 39*   < > 27* 28*   < > 47* 42*  --  28*  CREATININE 1.58*   < > 1.31* 1.47* 1.56*   < > 1.58* 1.56*   < > 1.9* 1.86*  --  1.4*  CALCIUM   8.5*   < > 8.9 8.8* 8.6*   < > 9.1 9.5   < > 9.9 12.3* 8.0* 8.7  MG 2.4  --  1.7 2.0  --   --   --   --   --   --   --   --   --   PHOS 3.0  --  3.3 3.4 2.9  --   --   --   --   --   --   --   --    < > = values in this interval not displayed.   Recent Labs    05/27/23 0909 06/29/23 0823 08/27/23 1313 09/09/23 0000  AST 23 21 25 18   ALT 15 11 19 10   ALKPHOS 66 61 91 72  BILITOT 0.6 0.7 0.6  --   PROT 7.1 7.0 8.3*  --   ALBUMIN  3.7 3.5 4.1 3.9   Recent Labs    06/29/23 0823 06/30/23 0452 07/01/23 0510 07/08/23 0000 08/27/23 1313 09/09/23 0000  WBC 10.4 7.8 7.1 7.2 9.0 7.5  NEUTROABS 6.9  --   --  3,262.00 4.9  --   HGB 10.0* 9.4* 9.6* 9.8* 12.3 10.7*  HCT 29.8* 28.2* 28.4* 31* 36.9 34*  MCV 92.3 91.3 91.0  --  90.0  --   PLT 234 235 233 253 240 222   Lab Results  Component Value Date   TSH 2.573 08/26/2020   Lab Results  Component Value Date   HGBA1C 8.0 09/13/2023   Lab Results  Component Value Date   CHOL 75 08/27/2022   HDL 22 (L) 08/27/2022   LDLCALC 34 08/27/2022   TRIG 146 05/08/2023   CHOLHDL 3.4 08/27/2022    Significant Diagnostic Results in last 30 days:  No results found.  Assessment/Plan  1. RUQ pain (Primary) -  liver enzymes within normal -  x-ray of RUQ abdomen  2. Renovascular hypertension -  BP stable -  continue Hydralazine  50 mg TID, Metoprolol  succinate 25 mg daily and Valsartan 160 mg daily -  monitor BP      3. Chronic diastolic CHF (congestive heart failure) (HCC)  -  No shortness of breath -  continue Furosemide  20 mg daily       Family/ staff Communication: Discussed plan of care with charge nurse.  Labs/tests ordered: X-ray of RUQ abdomen    Inge Mangle, DNP, MSN, FNP-BC [facility] [phone] (Monday-Friday 8:00 a.m. - 5:00 p.m.) [phone] (after hours)

## 2023-10-06 ENCOUNTER — Other Ambulatory Visit: Payer: Self-pay | Admitting: Student

## 2023-10-06 DIAGNOSIS — G8929 Other chronic pain: Secondary | ICD-10-CM

## 2023-10-06 MED ORDER — PREGABALIN 100 MG PO CAPS: 100.0000 mg | ORAL_CAPSULE | Freq: Every evening | ORAL | 1 refills | Status: DC

## 2023-10-06 NOTE — Progress Notes (Signed)
Refill of chronic pain medication.

## 2023-10-13 ENCOUNTER — Other Ambulatory Visit: Payer: Self-pay | Admitting: Student

## 2023-10-13 DIAGNOSIS — F419 Anxiety disorder, unspecified: Secondary | ICD-10-CM

## 2023-10-13 MED ORDER — LORAZEPAM 0.5 MG PO TABS: 0.5000 mg | ORAL_TABLET | Freq: Every day | ORAL | 0 refills | Status: DC

## 2023-10-13 NOTE — Progress Notes (Signed)
 Refill anxiety medication.

## 2023-10-15 ENCOUNTER — Non-Acute Institutional Stay (SKILLED_NURSING_FACILITY): Payer: Self-pay | Admitting: Student

## 2023-10-15 ENCOUNTER — Telehealth: Payer: Self-pay | Admitting: Student

## 2023-10-15 ENCOUNTER — Encounter: Payer: Self-pay | Admitting: Student

## 2023-10-15 DIAGNOSIS — Z853 Personal history of malignant neoplasm of breast: Secondary | ICD-10-CM | POA: Diagnosis not present

## 2023-10-15 DIAGNOSIS — N184 Chronic kidney disease, stage 4 (severe): Secondary | ICD-10-CM

## 2023-10-15 DIAGNOSIS — I5031 Acute diastolic (congestive) heart failure: Secondary | ICD-10-CM

## 2023-10-15 DIAGNOSIS — I350 Nonrheumatic aortic (valve) stenosis: Secondary | ICD-10-CM

## 2023-10-15 NOTE — Progress Notes (Signed)
 Location:  Other [place].  Nursing Home Room Number: [facility] 505A Place of Service:  SNF [id number]) Provider:  Valrie Gehrig, MD  Patient Care Team: Valrie Gehrig, MD as PCP - General (Family Medicine) Gwyn Leos, MD as Consulting Physician (Internal Medicine) Marquita Situ Magali Schmitz, MD as Consulting Physician (General Surgery) Arlette Benders, RN (Inactive) as Oncology Nurse Navigator Glenis Langdon, MD as Referring Physician (Radiation Oncology)  Extended Emergency Contact Information Primary Emergency Contact: [facility] Phone: [phone] Relation: Daughter Secondary Emergency Contact: Hicklin,Roxanne Mobile Phone: [phone] Relation: Niece  Code Status:  DNR Goals of care: Advanced Directive information    09/30/2023    2:35 PM  Advanced Directives  Does Patient Have a Medical Advance Directive? Yes  Type of Advance Directive Out of facility DNR (pink MOST or yellow form)  Does patient want to make changes to medical advance directive? No - Patient declined     Chief Complaint  Patient presents with   Medical Management of Chronic Issues    Medical Management of Chronic Issues.     HPI:  Pt is a 89 y.o. female seen today for medical management of chronic diseases.    History of Present Illness The patient, with chronic kidney disease stage 4, presents with swelling and weight gain.  She has significant swelling, particularly in her legs, described as 'much bigger than usual' and 'a bit swollen'. She has gained weight over the last month. No shortness of breath is noted, but her legs are painful.  She has a history of breast cancer on both sides, and her right arm has always been larger. She owns a compression sleeve but has not been instructed to wear it. She wears compression hose regularly.  She wants to be more active and to walk, but has not had physical therapy in a month. She hopes to start restorative therapy soon. She feels  tired and lacks energy, often wanting to sleep all afternoon. She mentions that she used to have significant pain on her left side, which has improved, and she no longer experiences the same level of pain.  She has a history of anxiety and takes Ativan , which may contribute to her sleepiness. Before starting the medication, she was in a lot of pain and very upset, but now feels calmer and has less pain.  She experiences occasional coughing and feels her voice is not very strong. She denies having any recent thoughts of self-harm, which she attributes to the reduction in pain.  Past Medical History:  Diagnosis Date   Arthritis    Gout   Chronic kidney disease    Diarrhea    Hypercholesteremia    Hypertension    Neuropathy    feet and lower legs   Seasonal allergies    Skin cancer    face   Past Surgical History:  Procedure Laterality Date   ABDOMINAL HYSTERECTOMY  2000   APPENDECTOMY     BREAST BIOPSY Right 09/19/2019   affirm bx of calcs UOQ, x marker, path pending   BREAST BIOPSY Right 09/19/2019   us  bx of mass,heart marker, path pending   BREAST BIOPSY Right 09/19/2019   us  bx of LN, coil marker, path pending   CATARACT EXTRACTION Left    CATARACT EXTRACTION W/PHACO Right 10/24/2014   Procedure: CATARACT EXTRACTION PHACO AND INTRAOCULAR LENS PLACEMENT (IOC);  Surgeon: Annell Kidney, MD;  Location: [facility];  Service: Ophthalmology;  Laterality: Right;   ESOPHAGOGASTRODUODENOSCOPY N/A 03/07/2018   Procedure:  ESOPHAGOGASTRODUODENOSCOPY (EGD);  Surgeon: Selena Daily, MD;  Location: [facility] ENDOSCOPY;  Service: Gastroenterology;  Laterality: N/A;   MASTECTOMY MODIFIED RADICAL Right 10/13/2019   Procedure: MASTECTOMY MODIFIED RADICAL;  Surgeon: Marshall Skeeter, MD;  Location: [facility] ORS;  Service: General;  Laterality: Right;   RENAL ANGIOGRAPHY Right 04/11/2018   Procedure: RENAL ANGIOGRAPHY;  Surgeon: Celso College, MD;  Location: [facility] INVASIVE CV LAB;  Service:  Cardiovascular;  Laterality: Right;   SIMPLE MASTECTOMY WITH AXILLARY SENTINEL NODE BIOPSY Left 10/13/2019   Procedure: SIMPLE MASTECTOMY TRUE CUT BIOPSY, SENTINEL NODE BIOPSY;  Surgeon: Marshall Skeeter, MD;  Location: [facility] ORS;  Service: General;  Laterality: Left;    Allergies  Allergen Reactions   Penicillins Anaphylaxis    Patient tolerates ceftriaxone    Empagliflozin  Rash    Outpatient Encounter Medications as of 10/15/2023  Medication Sig   acetaminophen  (TYLENOL ) 650 MG CR tablet Take 650 mg by mouth 3 (three) times daily.   allopurinol  (ZYLOPRIM ) 100 MG tablet Take 150 mg by mouth daily.   Alpha-D-Galactosidase (GAS-X PREVENTION PO) Take 1 capsule by mouth every 8 (eight) hours as needed.   amLODipine  (NORVASC ) 10 MG tablet Take 10 mg by mouth at bedtime.   anastrozole  (ARIMIDEX ) 1 MG tablet Take 1 tablet (1 mg total) by mouth daily.   bismuth subsalicylate (PEPTO BISMOL) 262 MG/15ML suspension Take 30 mLs by mouth every 4 (four) hours as needed.   calcium  carbonate (OS-CAL - DOSED IN MG OF ELEMENTAL CALCIUM ) 1250 (500 Ca) MG tablet Take 2 tablets by mouth daily with breakfast.   cholecalciferol  (VITAMIN D3) 25 MCG (1000 UNIT) tablet Take 1,000 Units by mouth daily.   cyanocobalamin  (VITAMIN B12) 1000 MCG tablet Take 1,000 mcg by mouth daily.   diclofenac  Sodium (VOLTAREN ) 1 % GEL Apply 2 g topically 2 (two) times daily. To bilateral legs.   docusate sodium  (COLACE) 100 MG capsule Take 100 mg by mouth daily as needed for mild constipation.   escitalopram (LEXAPRO) 10 MG tablet Take 10 mg by mouth daily.   furosemide  (LASIX ) 40 MG tablet Take 0.5 tablets (20 mg total) by mouth daily. Increase to 1 tablet (40 mg total) by mouth daily (total daily dose 40 mg) as needed for up to 3 days for increased leg swelling, shortness of breath, weight gain 5+ lbs over 1-2 days. Seek medical advice if these symptoms are not improving with increased dose.   guaifenesin  (ROBITUSSIN) 100 MG/5ML syrup  Take 200 mg by mouth every 4 (four) hours as needed for cough.   hydrALAZINE  (APRESOLINE ) 50 MG tablet Take 1 tablet (50 mg total) by mouth 3 (three) times daily. Skip the dose if SBP less than 130 mmHg   loratadine  (CLARITIN ) 10 MG tablet Take 10 mg by mouth daily as needed for allergies. AM   LORazepam  (ATIVAN ) 0.5 MG tablet Take 0.5 mg by mouth daily. Give 0.5mg  by mouth every 24 hours as needed.   melatonin 3 MG TABS tablet Take 3 mg by mouth at bedtime.   metoprolol  succinate (TOPROL -XL) 25 MG 24 hr tablet Take 1 tablet (25 mg total) by mouth daily.   OXYGEN Inhale into the lungs. 2lpm   polyethylene glycol (MIRALAX  / GLYCOLAX ) 17 g packet Take 17 g by mouth daily. Skip the dose if no constipation   pregabalin  (LYRICA ) 100 MG capsule Take 1 capsule (100 mg total) by mouth at bedtime.   senna (SENOKOT) 8.6 MG TABS tablet Take 1 tablet by mouth every other day.  sodium bicarbonate  650 MG tablet Take 650 mg by mouth every 8 (eight) hours as needed for heartburn.   valsartan (DIOVAN) 160 MG tablet Take 160 mg by mouth daily.   Wheat Dextrin (BENEFIBER ON THE GO) PACK Take 1 packet by mouth daily as needed.   [DISCONTINUED] LORazepam  (ATIVAN ) 0.5 MG tablet Take 1 tablet (0.5 mg total) by mouth daily. Additional 0.5 mg q12 hours prn x14 days.   No facility-administered encounter medications on file as of 10/15/2023.    Review of Systems  Immunization History  Administered Date(s) Administered   Influenza Inj Mdck Quad Pf 03/26/2016, 02/14/2018, 02/15/2019, 02/25/2021, 03/02/2022   Influenza Split 02/20/2015   Influenza, Seasonal, Injecte, Preservative Fre 04/13/2012, 03/17/2013   Influenza,inj,Quad PF,6+ Mos 03/07/2014, 02/20/2020   Influenza-Unspecified 03/26/2016, 02/04/2017, 02/14/2018, 02/25/2021, 04/12/2023   Moderna Sars-Covid-2 Vaccination 05/24/2019, 06/24/2019, 02/22/2020, 02/26/2022   PNEUMOCOCCAL CONJUGATE-20 04/09/2023   Pneumococcal Conjugate-13 09/01/2013   Pneumococcal  Polysaccharide-23 04/13/2012   RSV,unspecified 04/09/2023   Td 08/07/2010   Tdap 08/07/2010   Zoster, Live 09/12/2007   Pertinent  Health Maintenance Due  Topic Date Due   FOOT EXAM  Never done   OPHTHALMOLOGY EXAM  Never done   INFLUENZA VACCINE  12/10/2023   HEMOGLOBIN A1C  03/15/2024   DEXA SCAN  Completed      01/09/2022   10:55 AM 02/18/2022    1:36 PM 03/18/2022    4:19 PM 03/18/2022    4:46 PM 05/13/2022   10:17 AM  Fall Risk  (RETIRED) Patient Fall Risk Level High fall risk High fall risk Moderate fall risk Moderate fall risk High fall risk   Functional Status Survey:    Vitals:   10/15/23 0856 10/15/23 0906  BP: (!) 159/72 (!) 140/74  Pulse: 69   Resp: 16   Temp: (!) 97.3 F (36.3 C)   SpO2: 95%   Weight: 168 lb 3.2 oz (76.3 kg)   Height: 5\' 2"  (1.575 m)    Body mass index is 30.76 kg/m. Physical Exam Constitutional:      Appearance: Normal appearance.  Cardiovascular:     Rate and Rhythm: Normal rate and regular rhythm.     Pulses: Normal pulses.     Heart sounds: Murmur heard.  Pulmonary:     Effort: Pulmonary effort is normal.  Abdominal:     General: Abdomen is flat. Bowel sounds are normal.     Palpations: Abdomen is soft.  Musculoskeletal:        General: Swelling present. No tenderness.  Skin:    General: Skin is warm and dry.  Neurological:     Mental Status: She is alert and oriented to person, place, and time.     Gait: Gait normal.  Psychiatric:        Mood and Affect: Mood normal.     Labs reviewed: Recent Labs    04/01/23 0519 04/12/23 0000 05/08/23 0402 05/09/23 0436 05/10/23 0210 05/11/23 0515 07/02/23 0504 07/03/23 0454 07/05/23 0000 07/19/23 0000 08/27/23 1313 09/06/23 0000 09/09/23 0000  NA 138   < > 136 135 133*   < > 139 140   < > 136* 138  --  141  K 4.4   < > 3.7 3.8 4.0   < > 3.6 3.7   < > 4.0 3.9  --  4.7  CL 107   < > 108 106 105   < > 105 105   < > 101 101  --  112*  CO2 21*   < >  19* 18* 19*   < > 25 25    < > 27* 26  --  21  GLUCOSE 156*   < > 136* 119* 137*   < > 115* 113*  --   --  152*  --   --   BUN 39*   < > 31* 34* 39*   < > 27* 28*   < > 47* 42*  --  28*  CREATININE 1.58*   < > 1.31* 1.47* 1.56*   < > 1.58* 1.56*   < > 1.9* 1.86*  --  1.4*  CALCIUM  8.5*   < > 8.9 8.8* 8.6*   < > 9.1 9.5   < > 9.9 12.3* 8.0* 8.7  MG 2.4  --  1.7 2.0  --   --   --   --   --   --   --   --   --   PHOS 3.0  --  3.3 3.4 2.9  --   --   --   --   --   --   --   --    < > = values in this interval not displayed.   Recent Labs    05/27/23 0909 06/29/23 0823 08/27/23 1313 09/09/23 0000  AST 23 21 25 18   ALT 15 11 19 10   ALKPHOS 66 61 91 72  BILITOT 0.6 0.7 0.6  --   PROT 7.1 7.0 8.3*  --   ALBUMIN  3.7 3.5 4.1 3.9   Recent Labs    06/29/23 0823 06/30/23 0452 07/01/23 0510 07/08/23 0000 08/27/23 1313 09/09/23 0000  WBC 10.4 7.8 7.1 7.2 9.0 7.5  NEUTROABS 6.9  --   --  3,262.00 4.9  --   HGB 10.0* 9.4* 9.6* 9.8* 12.3 10.7*  HCT 29.8* 28.2* 28.4* 31* 36.9 34*  MCV 92.3 91.3 91.0  --  90.0  --   PLT 234 235 233 253 240 222   Lab Results  Component Value Date   TSH 2.573 08/26/2020   Lab Results  Component Value Date   HGBA1C 8.0 09/13/2023   Lab Results  Component Value Date   CHOL 75 08/27/2022   HDL 22 (L) 08/27/2022   LDLCALC 34 08/27/2022   TRIG 146 05/08/2023   CHOLHDL 3.4 08/27/2022    Significant Diagnostic Results in last 30 days:  No results found.  Assessment/Plan Heart failure with reduced ejection fraction Heart failure with reduced ejection fraction leading to fluid accumulation and edema. Increased fatigue and leg pain are present, but no current dyspnea. Impaired cardiac contractility contributes to fluid retention, complicating fluid management due to the need to balance diuretic therapy with renal function. - Administer Lasix  40 mg twice daily for fluid management. - Administer Metolazone as needed for weight gain or fluid overload. - Perform BMP on Monday to  assess renal and cardiac function. - Monitor for dyspnea over the weekend and adjust treatment as necessary.  Edema Significant edema, particularly in the legs, likely due to heart failure and fluid retention. Swelling in the arm may be related to breast cancer history. - Administer Lasix  40 mg twice daily for edema management. - Administer Metolazone as needed for edema related to weight gain or fluid overload. - Encourage use of compression sleeve for arm swelling.  Chronic kidney disease stage 4 Chronic kidney disease stage 4 complicates fluid management. Monitoring renal function is essential to guide diuretic therapy. - Perform BMP on Monday to assess renal function.  Aortic stenosis Aortic stenosis is present but not causing acute symptoms. Lungs are clear on examination.  Hx of Breast cancer Bilateral breast cancer with possible lymphedema in the right arm. - Encourage use of compression sleeve for right arm swelling.  Goals of Care She prefers optimal care over strictly comfort measures in case of illness, indicating a desire for hospitalization where it would be best for her condition. - Discuss and document preferences for hospitalization versus comfort care in case of illness.  Follow-up Follow-up needed to assess response to fluid management and renal function. - Perform BMP on Monday to assess renal and cardiac function. - Monitor fluid status and adjust diuretic therapy as needed.  Family/ staff Communication: nursing  Labs/tests ordered:  BMP, BNP

## 2023-10-15 NOTE — Telephone Encounter (Signed)
 Copied from CRM [id number]. Topic: Clinical - Medical Advice >> Oct 15, 2023  9:00 AM Brittney F wrote: Reason for CRM:   Caller: Kristen   Calling from: [employer]  Reason:   Calling in because they have received word that the patient has been diagnosed with COPD and has not been prescribed a controller inhaler. The insurance is recommending either a LABA (Long-Acting Beta-Agonists)  inhaler or a LAMA (Long-Acting Muscarinic-Antagonists) inhaler be prescribed by the patient's PCP per their recommended guidelines. They are calling to confirm if the patient's PCP would be willing to prescribe.  Contact Information:   Phone: [phone] Fax: [phone]

## 2023-10-15 NOTE — Telephone Encounter (Signed)
 SNF resident, all concerns to be addressed via facility staff.

## 2023-10-16 ENCOUNTER — Encounter: Payer: Self-pay | Admitting: Student

## 2023-10-16 DIAGNOSIS — I350 Nonrheumatic aortic (valve) stenosis: Secondary | ICD-10-CM | POA: Insufficient documentation

## 2023-10-18 LAB — BASIC METABOLIC PANEL WITH GFR
BUN: 19 (ref 4–21)
CO2: 25 — AB (ref 13–22)
Chloride: 105 (ref 99–108)
Creatinine: 1.9 — AB (ref 0.5–1.1)
Glucose: 118
Potassium: 4 meq/L (ref 3.5–5.1)
Sodium: 140 (ref 137–147)

## 2023-10-18 LAB — COMPREHENSIVE METABOLIC PANEL WITH GFR
Calcium: 9.6 (ref 8.7–10.7)
eGFR: 25

## 2023-11-02 ENCOUNTER — Encounter: Payer: Self-pay | Admitting: Nurse Practitioner

## 2023-11-02 ENCOUNTER — Non-Acute Institutional Stay (SKILLED_NURSING_FACILITY): Payer: Self-pay | Admitting: Nurse Practitioner

## 2023-11-02 DIAGNOSIS — R5383 Other fatigue: Secondary | ICD-10-CM

## 2023-11-02 NOTE — Progress Notes (Signed)
 Location:  Other Nursing Home Room Number: [facility] [address]A Place of Service:  SNF [phone])  Abdul, [place], MD  Patient Care Team: Abdul Fine, MD as PCP - General (Family Medicine) Rennie Cindy SAUNDERS, MD as Consulting Physician (Internal Medicine) Dessa Reyes ORN, MD as Consulting Physician (General Surgery) Dannielle Arlean FALCON, RN (Inactive) as Oncology Nurse Navigator Lenn Aran, MD as Referring Physician (Radiation Oncology)  Extended Emergency Contact Information Primary Emergency Contact: [facility] Phone: [phone] Relation: Daughter Secondary Emergency Contact: Hicklin,Roxanne Mobile Phone: [phone] Relation: Niece  Goals of care: Advanced Directive information    11/02/2023    1:56 PM  Advanced Directives  Does Patient Have a Medical Advance Directive? Yes  Type of Advance Directive Out of facility DNR (pink MOST or yellow form)  Does patient want to make changes to medical advance directive? No - Patient declined     Chief Complaint  Patient presents with   Acute Visit    Lethargic hard to arousal    HPI:  Pt is a 89 y.o. female seen today for an acute visit due to being very lethargic and hard to wake up.  She was resting in her recliner leaning to left side when staff came in but would not wake up easily when interacted with by staff. She kept dozing off.  Neuro assessment was WNL but twitching more than usual. Pt reports sometimes twitching of upper extremities  She had no complaints of pain, no shortness of breath, cough or congestion No fever or chills.  No dysuria.  Staff reports overall increase in fatigue over the last few days.  No changes in oral intake    Past Medical History:  Diagnosis Date   Arthritis    Gout   Chronic kidney disease    Diarrhea    Hypercholesteremia    Hypertension    Neuropathy    feet and lower legs   Seasonal allergies    Skin cancer    face   Past Surgical History:  Procedure  Laterality Date   ABDOMINAL HYSTERECTOMY  2000   APPENDECTOMY     BREAST BIOPSY Right 09/19/2019   affirm bx of calcs UOQ, x marker, path pending   BREAST BIOPSY Right 09/19/2019   us  bx of mass,heart marker, path pending   BREAST BIOPSY Right 09/19/2019   us  bx of LN, coil marker, path pending   CATARACT EXTRACTION Left    CATARACT EXTRACTION W/PHACO Right 10/24/2014   Procedure: CATARACT EXTRACTION PHACO AND INTRAOCULAR LENS PLACEMENT (IOC);  Surgeon: Dene Etienne, MD;  Location: [facility];  Service: Ophthalmology;  Laterality: Right;   ESOPHAGOGASTRODUODENOSCOPY N/A 03/07/2018   Procedure: ESOPHAGOGASTRODUODENOSCOPY (EGD);  Surgeon: Unk Corinn Skiff, MD;  Location: [facility];  Service: Gastroenterology;  Laterality: N/A;   MASTECTOMY MODIFIED RADICAL Right 10/13/2019   Procedure: MASTECTOMY MODIFIED RADICAL;  Surgeon: Dessa Reyes ORN, MD;  Location: [facility] ORS;  Service: General;  Laterality: Right;   RENAL ANGIOGRAPHY Right 04/11/2018   Procedure: RENAL ANGIOGRAPHY;  Surgeon: Marea Selinda RAMAN, MD;  Location: [facility] INVASIVE CV LAB;  Service: Cardiovascular;  Laterality: Right;   SIMPLE MASTECTOMY WITH AXILLARY SENTINEL NODE BIOPSY Left 10/13/2019   Procedure: SIMPLE MASTECTOMY TRUE CUT BIOPSY, SENTINEL NODE BIOPSY;  Surgeon: Dessa Reyes ORN, MD;  Location: [facility] ORS;  Service: General;  Laterality: Left;    Allergies  Allergen Reactions   Penicillins Anaphylaxis    Patient tolerates ceftriaxone    Empagliflozin  Rash    Outpatient Encounter Medications as of 11/02/2023  Medication Sig   acetaminophen  (TYLENOL ) 650 MG CR tablet Take 650 mg by mouth 3 (three) times daily.   allopurinol  (ZYLOPRIM ) 100 MG tablet Take 150 mg by mouth daily.   Alpha-D-Galactosidase (GAS-X PREVENTION PO) Take 1 capsule by mouth every 8 (eight) hours as needed.   amLODipine  (NORVASC ) 10 MG tablet Take 10 mg by mouth at bedtime.   anastrozole  (ARIMIDEX ) 1 MG tablet Take 1 tablet (1 mg  total) by mouth daily.   bismuth subsalicylate (PEPTO BISMOL) 262 MG/15ML suspension Take 30 mLs by mouth every 4 (four) hours as needed.   calcium  carbonate (OS-CAL - DOSED IN MG OF ELEMENTAL CALCIUM ) 1250 (500 Ca) MG tablet Take 2 tablets by mouth daily with breakfast.   cholecalciferol  (VITAMIN D3) 25 MCG (1000 UNIT) tablet Take 1,000 Units by mouth daily.   cyanocobalamin  (VITAMIN B12) 1000 MCG tablet Take 1,000 mcg by mouth daily.   diclofenac  Sodium (VOLTAREN ) 1 % GEL Apply 2 g topically 2 (two) times daily. To bilateral legs.   docusate sodium  (COLACE) 100 MG capsule Take 100 mg by mouth daily as needed for mild constipation.   escitalopram (LEXAPRO) 10 MG tablet Take 10 mg by mouth daily.   furosemide  (LASIX ) 40 MG tablet Take 0.5 tablets (20 mg total) by mouth daily. Increase to 1 tablet (40 mg total) by mouth daily (total daily dose 40 mg) as needed for up to 3 days for increased leg swelling, shortness of breath, weight gain 5+ lbs over 1-2 days. Seek medical advice if these symptoms are not improving with increased dose. (Patient taking differently: Take 40 mg by mouth daily. Increase to 1 tablet (40 mg total) by mouth daily (total daily dose 40 mg) as needed for up to 3 days for increased leg swelling, shortness of breath, weight gain 5+ lbs over 1-2 days. Seek medical advice if these symptoms are not improving with increased dose.)   guaifenesin  (ROBITUSSIN) 100 MG/5ML syrup Take 200 mg by mouth every 4 (four) hours as needed for cough. (Patient taking differently: Take 10 mLs by mouth every 4 (four) hours as needed for cough.)   hydrALAZINE  (APRESOLINE ) 50 MG tablet Take 1 tablet (50 mg total) by mouth 3 (three) times daily. Skip the dose if SBP less than 130 mmHg   loratadine  (CLARITIN ) 10 MG tablet Take 10 mg by mouth daily as needed for allergies. AM   LORazepam  (ATIVAN ) 0.5 MG tablet Take 0.5 mg by mouth daily. Give 0.5mg  by mouth every 24 hours as needed.   melatonin 3 MG TABS tablet  Take 3 mg by mouth at bedtime.   metolazone (ZAROXOLYN) 2.5 MG tablet Take 2.5 mg by mouth daily.   metoprolol  succinate (TOPROL -XL) 25 MG 24 hr tablet Take 1 tablet (25 mg total) by mouth daily.   OXYGEN Inhale into the lungs. 2lpm   polyethylene glycol (MIRALAX  / GLYCOLAX ) 17 g packet Take 17 g by mouth daily. Skip the dose if no constipation   pregabalin  (LYRICA ) 100 MG capsule Take 1 capsule (100 mg total) by mouth at bedtime.   senna (SENOKOT) 8.6 MG TABS tablet Take 1 tablet by mouth every other day.   sodium bicarbonate  650 MG tablet Take 650 mg by mouth every 8 (eight) hours as needed for heartburn.   valsartan (DIOVAN) 160 MG tablet Take 160 mg by mouth daily.   Wheat Dextrin (BENEFIBER ON THE GO) PACK Take 1 packet by mouth daily as needed.   No facility-administered encounter medications on file as  of 11/02/2023.    Review of Systems  Constitutional:  Positive for fatigue. Negative for activity change, appetite change and unexpected weight change.  HENT:  Negative for congestion, facial swelling, hearing loss and sinus pressure.   Eyes: Negative.   Respiratory:  Negative for cough and shortness of breath.   Cardiovascular:  Negative for chest pain, palpitations and leg swelling.  Gastrointestinal:  Negative for abdominal pain, constipation and diarrhea.  Genitourinary:  Negative for difficulty urinating and dysuria.  Musculoskeletal:  Negative for arthralgias and myalgias.  Skin:  Negative for color change and wound.  Neurological:  Negative for dizziness, facial asymmetry, weakness and headaches.  Psychiatric/Behavioral:  Negative for agitation.     Immunization History  Administered Date(s) Administered   Influenza Inj Mdck Quad Pf 03/26/2016, 02/14/2018, 02/15/2019, 02/25/2021, 03/02/2022   Influenza Split 02/20/2015   Influenza, Seasonal, Injecte, Preservative Fre 04/13/2012, 03/17/2013   Influenza,inj,Quad PF,6+ Mos 03/07/2014, 02/20/2020   Influenza-Unspecified  03/26/2016, 02/04/2017, 02/14/2018, 02/25/2021, 04/12/2023   Moderna Sars-Covid-2 Vaccination 05/24/2019, 06/24/2019, 02/22/2020, 02/26/2022   PNEUMOCOCCAL CONJUGATE-20 04/09/2023   Pneumococcal Conjugate-13 09/01/2013   Pneumococcal Polysaccharide-23 04/13/2012   RSV,unspecified 04/09/2023   Td 08/07/2010   Tdap 08/07/2010   Zoster, Live 09/12/2007   Pertinent  Health Maintenance Due  Topic Date Due   FOOT EXAM  Never done   OPHTHALMOLOGY EXAM  Never done   INFLUENZA VACCINE  12/10/2023   HEMOGLOBIN A1C  03/15/2024   DEXA SCAN  Completed      01/09/2022   10:55 AM 02/18/2022    1:36 PM 03/18/2022    4:19 PM 03/18/2022    4:46 PM 05/13/2022   10:17 AM  Fall Risk  (RETIRED) Patient Fall Risk Level High fall risk  High fall risk  Moderate fall risk  Moderate fall risk  High fall risk      Data saved with a previous flowsheet row definition   Functional Status Survey:    Vitals:   11/02/23 1354  BP: 134/68  Pulse: 71  Resp: 16  Temp: (!) 97.3 F (36.3 C)  SpO2: 95%  Weight: 154 lb (69.9 kg)  Height: 5' 2 (1.575 m)   Body mass index is 28.17 kg/m. Physical Exam Constitutional:      General: She is not in acute distress.    Appearance: She is well-developed. She is not diaphoretic.  HENT:     Head: Normocephalic and atraumatic.     Mouth/Throat:     Pharynx: No oropharyngeal exudate.   Eyes:     Conjunctiva/sclera: Conjunctivae normal.     Pupils: Pupils are equal, round, and reactive to light.    Cardiovascular:     Rate and Rhythm: Normal rate and regular rhythm.     Heart sounds: Normal heart sounds.  Pulmonary:     Effort: Pulmonary effort is normal.     Breath sounds: Normal breath sounds.  Abdominal:     General: Bowel sounds are normal.     Palpations: Abdomen is soft.   Musculoskeletal:     Cervical back: Normal range of motion and neck supple.     Right lower leg: No edema.     Left lower leg: No edema.   Skin:    General: Skin is warm and  dry.   Neurological:     General: No focal deficit present.     Mental Status: Mental status is at baseline. She is lethargic.     Motor: No tremor.   Psychiatric:  Mood and Affect: Mood normal.     Labs reviewed: Recent Labs    04/01/23 0519 04/12/23 0000 05/08/23 0402 05/09/23 0436 05/10/23 0210 05/11/23 0515 07/02/23 0504 07/03/23 0454 07/05/23 0000 08/27/23 1313 09/06/23 0000 09/09/23 0000 10/18/23 0000  NA 138   < > 136 135 133*   < > 139 140   < > 138  --  141 140  K 4.4   < > 3.7 3.8 4.0   < > 3.6 3.7   < > 3.9  --  4.7 4.0  CL 107   < > 108 106 105   < > 105 105   < > 101  --  112* 105  CO2 21*   < > 19* 18* 19*   < > 25 25   < > 26  --  21 25*  GLUCOSE 156*   < > 136* 119* 137*   < > 115* 113*  --  152*  --   --   --   BUN 39*   < > 31* 34* 39*   < > 27* 28*   < > 42*  --  28* 19  CREATININE 1.58*   < > 1.31* 1.47* 1.56*   < > 1.58* 1.56*   < > 1.86*  --  1.4* 1.9*  CALCIUM  8.5*   < > 8.9 8.8* 8.6*   < > 9.1 9.5   < > 12.3* 8.0* 8.7 9.6  MG 2.4  --  1.7 2.0  --   --   --   --   --   --   --   --   --   PHOS 3.0  --  3.3 3.4 2.9  --   --   --   --   --   --   --   --    < > = values in this interval not displayed.   Recent Labs    05/27/23 0909 06/29/23 0823 08/27/23 1313 09/09/23 0000  AST 23 21 25 18   ALT 15 11 19 10   ALKPHOS 66 61 91 72  BILITOT 0.6 0.7 0.6  --   PROT 7.1 7.0 8.3*  --   ALBUMIN  3.7 3.5 4.1 3.9   Recent Labs    06/29/23 0823 06/30/23 0452 07/01/23 0510 07/08/23 0000 08/27/23 1313 09/09/23 0000  WBC 10.4 7.8 7.1 7.2 9.0 7.5  NEUTROABS 6.9  --   --  3,262.00 4.9  --   HGB 10.0* 9.4* 9.6* 9.8* 12.3 10.7*  HCT 29.8* 28.2* 28.4* 31* 36.9 34*  MCV 92.3 91.3 91.0  --  90.0  --   PLT 234 235 233 253 240 222   Lab Results  Component Value Date   TSH 2.573 08/26/2020   Lab Results  Component Value Date   HGBA1C 8.0 09/13/2023   Lab Results  Component Value Date   CHOL 75 08/27/2022   HDL 22 (L) 08/27/2022   LDLCALC  34 08/27/2022   TRIG 146 05/08/2023   CHOLHDL 3.4 08/27/2022    Significant Diagnostic Results in last 30 days:  No results found.  Assessment/Plan  1. Lethargy (Primary) More lethargy today, hard to arousal around lunch with increase in twitching.  Pt has no specific compliants or symptoms  Will get US  C&S Bmp, mag and cbc with diff -will decrease ativan  to 0.25 mg daily as this can be sedating but is very anxious without medication.  Staff to monitor VS q shift for  72 hours.   Graciella Arment K. Caro BODILY [facility] [phone]

## 2023-11-04 ENCOUNTER — Other Ambulatory Visit: Payer: Self-pay | Admitting: Nurse Practitioner

## 2023-11-04 MED ORDER — PREGABALIN 50 MG PO CAPS
50.0000 mg | ORAL_CAPSULE | Freq: Every day | ORAL | 1 refills | Status: DC
Start: 2023-11-04 — End: 2023-11-30

## 2023-11-08 ENCOUNTER — Encounter: Payer: Self-pay | Admitting: Student

## 2023-11-10 ENCOUNTER — Other Ambulatory Visit: Payer: Self-pay | Admitting: Adult Health

## 2023-11-10 DIAGNOSIS — F419 Anxiety disorder, unspecified: Secondary | ICD-10-CM

## 2023-11-10 MED ORDER — LORAZEPAM 0.5 MG PO TABS: 0.2500 mg | ORAL_TABLET | Freq: Every day | ORAL | 0 refills | Status: DC

## 2023-11-10 MED ORDER — LORAZEPAM 0.5 MG PO TABS: 0.2500 mg | ORAL_TABLET | Freq: Every day | ORAL | 0 refills | Status: DC | PRN

## 2023-11-18 LAB — BASIC METABOLIC PANEL WITH GFR
BUN: 63 — AB (ref 4–21)
CO2: 25 — AB (ref 13–22)
Chloride: 107 (ref 99–108)
Creatinine: 2.4 — AB (ref 0.5–1.1)
Glucose: 125
Potassium: 3.9 meq/L (ref 3.5–5.1)
Sodium: 142 (ref 137–147)

## 2023-11-18 LAB — COMPREHENSIVE METABOLIC PANEL WITH GFR
Calcium: 10.4 (ref 8.7–10.7)
eGFR: 19

## 2023-11-23 NOTE — Progress Notes (Signed)
 [facility] Follow Up Visit   Patient Name: Tina Curtis, female   Patient [name]: 09/21/1926 Date of Service: 11/23/2023  Patient MRN: [id number] Provider Creating Note: Woodward Brought, MD  [phone] Primary Care Physician: [place]   [address] [place] [address] Additional Physicians/ Providers:   Impression/Recommendations   Tina Curtis is a 89 y.o. white female with peptic ulcer disease, renal artery stenosis, hypertension, depression, history of breast cancer, gout, and hyper lipidemia who presents for follow up for chronic kidney disease stage IIIB. Creatinine 1.9.   Chronic kidney disease stage IIIB: history of bland urine. Empagliflozin  caused rash.  - valsartan 40mg  daily - sodium bicarbonate .   Hypertension with chronic kidney disease and renal artery stenosis:  - Current regimen of valsartan, amlodipine , hydralazine  and metoprolol .  - discussed home blood pressure monitoring - Previously on furosemide  as needed.    Anemia with chronic kidney disease: hemoglobin at goal, 10.7  Secondary Hyperparathyroidism:   - Continue ergocalciferol  and calcium  carbonate.   Diabetes mellitus type II with chronic kidney disease: hemoglobin A1c of 8% on 09/13/23   - currently not on an SGLT-2 inhibitor. Empagliflozin  caused rash - diet controlled.   Patient Active Problem List  Diagnosis  . Atherosclerosis of renal artery (HCC)  . Hypertensive chronic kidney disease, benign, with chronic kidney disease stage I through stage IV, or unspecified  . Hyposmolality and/or hyponatremia  . Anemia in chronic kidney disease  . Secondary hyperparathyroidism of renal origin (HCC)  . Proteinuria  . Gout  . Stage 3b chronic kidney disease (HCC)    Orders Placed This Encounter  . PTH, Intact  . Renal Function Panel  . CBC and Differential       Return in about 3 months (around 02/23/2024).  Chief Complaint   Chief Complaint  Patient presents  with  . Follow-up    History of Present Illness  Tina Curtis presents for follow up. Patient presents with her niece and her daughter, Darice, is on the phone.   Patient is now at [facility] She is still adjusting living there and giving up some of her independence.   Patient has had several medication changes. She has been placed on lorezapam for anxiety.   Patient's valsartan dose was increased to 80mg . She is now on furosemide  40mg  and hydralazine  50mg  tid. Amlodipine  dose also increased.   Patient has had no more hospitalizations.   Patient continues to complain of weakness. She is working with physical therapy. She is unable to ambulate.   No use of nonsteroidal anti-inflammatory agents.      Medications   Current Outpatient Medications:  .  fluticasone (FLONASE) 50 MCG/ACT nasal spray, , Disp: , Rfl:  .  furosemide  (LASIX ) 40 MG tablet, Take 0.5 tablets (20 mg total) by mouth daily. Increase to 1 tablet (40 mg total) by mouth daily (total daily dose 40 mg) as needed for up to 3 days for increased leg swelling, shortness of breath, weight gain 5+ lbs over 1-2 days. Seek medical advice if these symptoms are not improving with increased dose., Disp: , Rfl:  .  hydrALAZINE  50 MG tablet, Take 1 tablet (50 mg total) by mouth 3 (three) times daily. Skip the dose if SBP less than 130 mmHg, Disp: , Rfl:  .  LORazepam  (ATIVAN ) 0.5 MG tablet, Take 0.5 tablets (0.25 mg total) by mouth daily as needed for anxiety. Give 0.5mg  by mouth every 24 hours as  needed., Disp: , Rfl:  .  pregabalin  (LYRICA ) 50 MG capsule, Take 1 capsule (50 mg total) by mouth at bedtime., Disp: , Rfl:  .  sertraline (ZOLOFT) 100 MG tablet, Take 100 mg by mouth 1 (one) time each day, Disp: , Rfl:  .  sertraline (ZOLOFT) 50 MG tablet, Take 50 mg by mouth 1 (one) time each day, Disp: , Rfl:  .  valsartan (Diovan) 80 MG tablet, Take 1 tablet (80 mg total) by mouth 1 (one) time each day (Patient taking differently:  Take 160 mg by mouth 1 (one) time each day), Disp: 90 tablet, Rfl: 3 .  ACETAMINOPHEN  PO, Take 650 tablets by mouth in the morning and 650 tablets at noon and 650 tablets in the evening., Disp: , Rfl:  .  allopurinol  (ZYLOPRIM ) 100 MG tablet, Take 150 mg by mouth 1 (one) time each day, Disp: , Rfl:  .  ALPHA-D-GALACTOSIDASE PO, Take 1 capsule by mouth if needed, Disp: , Rfl:  .  amLODIPine  (NORVASC ) 10 MG tablet, Take 10 mg by mouth 1 (one) time each day in the evening, Disp: , Rfl:  .  anastrozole  (ARIMIDEX ) 1 MG chemo tablet, Take 1 mg by mouth daily, Disp: , Rfl:  .  bismuth subsalicylate (PEPTO BISMOL) 262 MG/15ML suspension, Take 30 mL by mouth every 4 (four) hours if needed, Disp: , Rfl:  .  calcium  carbonate (OS-CAL) 1250 (500 Ca) MG tablet, Take 2 tablets by mouth in the morning and 2 tablets in the evening., Disp: , Rfl:  .  Cholecalciferol  (Vitamin D ) 25 MCG (1000 UT) tablet, Take 1,000 Units by mouth 1 (one) time each day, Disp: , Rfl:  .  cyanocobalamin  (VITAMIN B-12) 1000 MCG tablet, Take 1,000 mcg by mouth in the morning., Disp: , Rfl:  .  Diclofenac  Sodium 1 % gel, Every 12 hours as needed, Disp: , Rfl:  .  docusate sodium  (COLACE) 100 MG capsule, Take 1 capsule by mouth 2 (two) times a day if needed, Disp: , Rfl:  .  guaiFENesin  (ROBITUSSIN) 100 MG/5ML syrup, Take 200 mg by mouth every 4 (four) hours as needed for cough., Disp: , Rfl:  .  loratadine  (CLARITIN ) 10 MG tablet, Take 1 tablet by mouth at bed time, Disp: , Rfl:  .  melatonin 3 MG tablet, Take by mouth every night, Disp: , Rfl:  .  metoprolol  succinate XL (TOPROL -XL) 25 MG 24 hr tablet, Take 1.5 tablets by mouth at bed time, Disp: , Rfl:  .  polyethylene glycol (GLYCOLAX ) 17 g packet, Take 17 g by mouth if needed, Disp: , Rfl:  .  senna (SENOKOT) 8.6 MG tablet, Take 1 tablet by mouth, Disp: , Rfl:  .  sodium bicarbonate  650 MG tablet, Take 650 mg by mouth every 8 (eight) hours as needed for heartburn., Disp: , Rfl:  .   Wheat Dextrin (Benefiber On The GO) pack, Take 1 packet by mouth if needed, Disp: , Rfl:  .  white petrolatum-corn starch-lanolin (TRIPLE PASTE) 12.8 % ointment, Apply 1 Application topically if needed, Disp: , Rfl:    Allergies Penicillins, Zinc , and Empagliflozin   History Past Medical History:  Diagnosis Date  . Anemia in chronic kidney disease 02/01/2019  . Atherosclerosis of renal artery (HCC) 02/01/2019  . Chronic kidney disease, Stage IV (severe) (HCC) 02/01/2019  . Gout   . Hyperlipidemia   . Hypertensive chronic kidney disease, benign, with chronic kidney disease stage I through stage IV, or unspecified 02/01/2019  . Hyposmolality  and/or hyponatremia 02/01/2019  . Major depressive disorder   . Other malignant neoplasm of unspecified site (HCC)   . Peptic ulcer   . Proteinuria 02/01/2019  . Seasonal allergic rhinitis   . Secondary hyperparathyroidism of renal origin (HCC) 02/01/2019  . Stage 3b chronic kidney disease (HCC) 05/31/2019    Past Surgical History:  Procedure Laterality Date  . APPENDECTOMY    . CATARACT EXTRACTION, BILATERAL    . HYSTERECTOMY    . MASTECTOMY Left 10/13/2019   Dr. Dessa  . RENAL ARTERY STENT Right 04/2018   Dr. Marea   Family History  Problem Relation Age of Onset  . Kidney disease Sibling        2 sisters  . Hypertension Sibling        1 sister  . Cancer Sibling        1 sister (breast)  . Cancer Child        1 daughter (breast)  . Dementia Sibling        1 sister  . Hypertension Mother   . Heart disease Mother   . Hypertension Father   . Heart disease Father   . Stroke Father    Social History   Tobacco Use  . Smoking status: Never  . Smokeless tobacco: Never  Substance Use Topics  . Alcohol use: No     Physical Exam  Vitals There were no vitals taken for this visit.  Vitals reviewed. Constitutional: She is oriented to person, place, and time.  HEENT:  Nose: Nose normal. Mouth/Throat: Oropharynx is clear and  moist.  Bilateral hearing loss with hearing aides.   Eyes: Conjunctivae are normal. Pupils are equal, round, and reactive to light.  Cardiovascular:  Normal rate and regular rhythm.           Pulmonary/Chest: Effort normal and breath sounds normal.  Abdominal: Soft. Bowel sounds are normal.  Neurological: She is alert and oriented to person, place, and time.  Skin: Skin is warm and dry.     Laboratory Studies  Chemistry  Lab Units 02/02/23 1358 11/10/22 9096 10/15/22 0919 09/29/22 9090 08/12/22 0917 06/26/22 0826 04/15/22 1213 02/23/22 0855 11/24/21 1222 11/24/21 1222  SODIUM mmol/L 139 140 145 141 145 143 142 143   < > 142  POTASSIUM mmol/L 4.0 4.8 3.6 3.8 3.9 4.4 4.4 4.6   < > 5.0  CHLORIDE mmol/L 98 105 107 106 107 104 104 106   < > 105  CO2 mmol/L 30 30.0 27 29.5 28 28.0 29 29.9   < > 27  MAGNESIUM  mg/dL  --   --   --  1.7*  --   --   --   --   --   --   CALCIUM  mg/dL 87.8* 89.9 9.1 9.5 9.8 10.0 10.4 9.4   < > 10.5*  PHOSPHORUS mg/dL 4.2  --  4.2  --  4.5*  --  3.6  --   --  4.3  ALK PHOS U/L  --  96  --  90  --  129*  --  69  --   --   PTH pg/mL 20  --  47  --  53  --  43  --   --  51  GLUCOSE mg/dL 836* 856* 93 881* 873* 160* 137 140*   < > 107*  ALBUMIN  g/dL 4.5 4.2 3.6 3.9 3.9 4.3 4.3 4.4   < > 4.5  BUN mg/dL 65* 38* 18 23 25  65* 33*  45*   < > 50*  CREATININE mg/dL 7.93* 1.5* 8.61* 1.2* 8.57* 1.5* 1.37* 1.5*   < > 1.64*  HEMOGLOBIN A1C %  --  6.9*  --   --   --  9.0*  --  7.2*  --   --    < > = values in this interval not displayed.        No lab exists for component: IRON SATURATION, TRANSSATPER   CBC  Lab Units 05/25/23 1512 02/02/23 1358 10/15/22 0919 08/12/22 0917 04/15/22 1213 11/24/21 1222  WBC AUTO Thousand/uL  --  9.0 6.7 6.6 7.0 7.7  HEMOGLOBIN g/dL  --  88.0 89.8* 88.5* 87.0 13.1  HEMOGLOBIN URINE  NEGATIVE NEGATIVE NEGATIVE  --   --   --   HEMATOCRIT %  --  36.5 30.7* 34.1* 38.5 38.6  MCV fL  --  91.3 92.5 92.7 94.8 96.0  PLATELETS AUTO  Thousand/uL  --  198 260 181 160 181    Urine  Lab Units 05/25/23 1512 02/02/23 1358 10/15/22 0919 04/15/22 1213 11/24/21 1222  COLOR U  YELLOW YELLOW YELLOW  --   --   KETONES U MG/DL  NEGATIVE NEGATIVE NEGATIVE  --   --   PROT/CREAT RATIO UR mg/g creat  --  0.174  174 0.615*  615*  --   --   ALB MG/G CREAT UR mcg/mg creat  --   --   --  226* NOTE        No lab exists for component: CYCLOSPORITR     Woodward Brought, MD  [employer], [place]

## 2023-11-26 ENCOUNTER — Inpatient Hospital Stay: Admitting: Internal Medicine

## 2023-11-26 ENCOUNTER — Inpatient Hospital Stay

## 2023-11-30 ENCOUNTER — Inpatient Hospital Stay: Attending: Internal Medicine

## 2023-11-30 ENCOUNTER — Inpatient Hospital Stay

## 2023-11-30 ENCOUNTER — Other Ambulatory Visit: Payer: Self-pay | Admitting: Nurse Practitioner

## 2023-11-30 ENCOUNTER — Encounter: Payer: Self-pay | Admitting: Internal Medicine

## 2023-11-30 ENCOUNTER — Inpatient Hospital Stay (HOSPITAL_BASED_OUTPATIENT_CLINIC_OR_DEPARTMENT_OTHER): Admitting: Internal Medicine

## 2023-11-30 VITALS — BP 148/61 | HR 69 | Temp 96.6°F | Resp 16 | Ht 62.0 in | Wt 158.0 lb

## 2023-11-30 DIAGNOSIS — Z79811 Long term (current) use of aromatase inhibitors: Secondary | ICD-10-CM | POA: Insufficient documentation

## 2023-11-30 DIAGNOSIS — C50912 Malignant neoplasm of unspecified site of left female breast: Secondary | ICD-10-CM | POA: Insufficient documentation

## 2023-11-30 DIAGNOSIS — R5383 Other fatigue: Secondary | ICD-10-CM | POA: Insufficient documentation

## 2023-11-30 DIAGNOSIS — C773 Secondary and unspecified malignant neoplasm of axilla and upper limb lymph nodes: Secondary | ICD-10-CM | POA: Insufficient documentation

## 2023-11-30 DIAGNOSIS — Z1732 Human epidermal growth factor receptor 2 negative status: Secondary | ICD-10-CM | POA: Insufficient documentation

## 2023-11-30 DIAGNOSIS — R519 Headache, unspecified: Secondary | ICD-10-CM | POA: Insufficient documentation

## 2023-11-30 DIAGNOSIS — Z923 Personal history of irradiation: Secondary | ICD-10-CM | POA: Diagnosis not present

## 2023-11-30 DIAGNOSIS — Z170 Estrogen receptor positive status [ER+]: Secondary | ICD-10-CM

## 2023-11-30 DIAGNOSIS — M170 Bilateral primary osteoarthritis of knee: Secondary | ICD-10-CM | POA: Insufficient documentation

## 2023-11-30 DIAGNOSIS — C50211 Malignant neoplasm of upper-inner quadrant of right female breast: Secondary | ICD-10-CM | POA: Insufficient documentation

## 2023-11-30 DIAGNOSIS — N184 Chronic kidney disease, stage 4 (severe): Secondary | ICD-10-CM | POA: Insufficient documentation

## 2023-11-30 DIAGNOSIS — Z90710 Acquired absence of both cervix and uterus: Secondary | ICD-10-CM | POA: Diagnosis not present

## 2023-11-30 DIAGNOSIS — Z803 Family history of malignant neoplasm of breast: Secondary | ICD-10-CM | POA: Diagnosis not present

## 2023-11-30 DIAGNOSIS — K862 Cyst of pancreas: Secondary | ICD-10-CM | POA: Diagnosis not present

## 2023-11-30 DIAGNOSIS — Z1721 Progesterone receptor positive status: Secondary | ICD-10-CM | POA: Insufficient documentation

## 2023-11-30 LAB — VITAMIN D 25 HYDROXY (VIT D DEFICIENCY, FRACTURES): Vit D, 25-Hydroxy: 44.05 ng/mL (ref 30–100)

## 2023-11-30 LAB — CMP (CANCER CENTER ONLY)
ALT: 15 U/L (ref 0–44)
AST: 25 U/L (ref 15–41)
Albumin: 4.3 g/dL (ref 3.5–5.0)
Alkaline Phosphatase: 79 U/L (ref 38–126)
Anion gap: 9 (ref 5–15)
BUN: 46 mg/dL — ABNORMAL HIGH (ref 8–23)
CO2: 20 mmol/L — ABNORMAL LOW (ref 22–32)
Calcium: 10 mg/dL (ref 8.9–10.3)
Chloride: 113 mmol/L — ABNORMAL HIGH (ref 98–111)
Creatinine: 2.13 mg/dL — ABNORMAL HIGH (ref 0.44–1.00)
GFR, Estimated: 21 mL/min — ABNORMAL LOW (ref >60)
Glucose, Bld: 186 mg/dL — ABNORMAL HIGH (ref 70–99)
Potassium: 3.9 mmol/L (ref 3.5–5.1)
Sodium: 142 mmol/L (ref 135–145)
Total Bilirubin: 0.6 mg/dL (ref 0.0–1.2)
Total Protein: 8.1 g/dL (ref 6.5–8.1)

## 2023-11-30 LAB — CBC WITH DIFFERENTIAL (CANCER CENTER ONLY)
Abs Immature Granulocytes: 0.06 K/uL (ref 0.00–0.07)
Basophils Absolute: 0.1 K/uL (ref 0.0–0.1)
Basophils Relative: 1 %
Eosinophils Absolute: 0.4 K/uL (ref 0.0–0.5)
Eosinophils Relative: 6 %
HCT: 33.4 % — ABNORMAL LOW (ref 36.0–46.0)
Hemoglobin: 11.1 g/dL — ABNORMAL LOW (ref 12.0–15.0)
Immature Granulocytes: 1 %
Lymphocytes Relative: 30 %
Lymphs Abs: 2.3 K/uL (ref 0.7–4.0)
MCH: 30.5 pg (ref 26.0–34.0)
MCHC: 33.2 g/dL (ref 30.0–36.0)
MCV: 91.8 fL (ref 80.0–100.0)
Monocytes Absolute: 0.6 K/uL (ref 0.1–1.0)
Monocytes Relative: 8 %
Neutro Abs: 4.1 K/uL (ref 1.7–7.7)
Neutrophils Relative %: 54 %
Platelet Count: 174 K/uL (ref 150–400)
RBC: 3.64 MIL/uL — ABNORMAL LOW (ref 3.87–5.11)
RDW: 14.7 % (ref 11.5–15.5)
WBC Count: 7.6 K/uL (ref 4.0–10.5)
nRBC: 0 % (ref 0.0–0.2)

## 2023-11-30 MED ORDER — PREGABALIN 50 MG PO CAPS: 50.0000 mg | ORAL_CAPSULE | Freq: Every day | ORAL | 1 refills | Status: DC

## 2023-11-30 NOTE — Progress Notes (Signed)
 No concerns today

## 2023-11-30 NOTE — Progress Notes (Signed)
 Per MD no prolia  today.

## 2023-11-30 NOTE — Progress Notes (Signed)
 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 Burnett] stage III T2N3 [19/24 LN]; ER/PR positive HER-2 negative.   # Left breast-invasive mammary carcinoma-status post mastectomy [id number]; stage I-ER/PR positive HER-2 negative  #November 23, 2019-start adjuvant radiation bilateral [finished August 31]; no chemo;   #September 22nd 2021- anastrozole  ----------------------------------------------------------------------------------------   A. RIGHT BREAST, 3:00; ULTRASOUND-GUIDED BIOPSY:  - INVASIVE MAMMARY CARCINOMA WITH MICROPAPILLARY FEATURES; Size of invasive carcinoma: 10mm in this sample  Histologic grade of invasive carcinoma: Grade 2 ; ER/PR > 905: her 2- NEG  A. RIGHT BREAST, 3:00; ULTRASOUND-GUIDED BIOPSY: - INVASIVE MAMMARY CARCINOMA WITH MICROPAPILLARY FEATURES. 10mm in this sample. Grade 2. Lymphovascular invasion: Present.   B. LYMPH NODE, RIGHT AXILLARY; ULTRASOUND-GUIDED NEEDLE CORE BIOPSY: - METASTATIC MAMMARY CARCINOMA, 6 MM IN THIS SAMPLE.   C. RIGHT BREAST, LATERAL CALCIFICATIONS; STEREOTACTIC BIOPSY: - DUCTAL CARCINOMA IN SITU, INTERMEDIATE GRADE, WITH CALCIFICATIONS. -------------------------------------------------------------   # CKD- Stage III-IV [Dr.Kolluru]  # SURVIVORSHIP:   # GENETICS:   DIAGNOSIS:  Right breast cancer stage III;  left breast cancer-stage I     Carcinoma of upper-inner quadrant of right breast in female,  estrogen receptor positive (HCC)  09/27/2019 Initial Diagnosis   Carcinoma of upper-inner quadrant of right breast in female, estrogen receptor positive (HCC)     HISTORY OF PRESENTING ILLNESS: Ambulating in a wheelchair.  With daughter karen.   Tina Curtis 89 y.o.  female synchronous bilateral breast cancer [right stage III ER/PR positive;Her2 NEG; left stage I ER/PR positive; Her2- NEG] s/p adjuvant radiation; currently on Anastrazole is here for follow-up.   Patient was in the hospital recently for congestive heart failure in February.  Patient is currently at the nursing home side of [place].  Otherwise patient denies breast pain, has tenderness with palpation. Denies hot flashes. Appetite is fine. Taking her anastrozole .  Chronic fatigue.  Otherwise no evidence of any confusion.   Review of Systems  Constitutional:  Positive for malaise/fatigue. Negative for chills, diaphoresis, fever and weight loss.  HENT:  Negative for nosebleeds and sore throat.   Eyes:  Negative for double vision.  Respiratory:  Negative for cough, hemoptysis, sputum production, shortness of breath and wheezing.   Cardiovascular:  Negative for chest pain, palpitations, orthopnea and leg swelling.  Gastrointestinal:  Negative for abdominal pain, blood in stool, constipation, diarrhea, heartburn, melena, nausea and vomiting.  Genitourinary:  Negative for dysuria, frequency and urgency.  Musculoskeletal:  Positive for back pain and joint pain.  Neurological:  Negative for dizziness, tingling, focal weakness, weakness and headaches.  Endo/Heme/Allergies:  Does not bruise/bleed easily.  Psychiatric/Behavioral:  Negative for depression. The patient is not nervous/anxious and does not have insomnia.      MEDICAL HISTORY:  Past Medical History:  Diagnosis Date   Arthritis    Gout   Chronic kidney disease    Diarrhea    Hypercholesteremia    Hypertension    Neuropathy    feet and lower legs   Seasonal  allergies    Skin cancer    face    SURGICAL HISTORY:  Past Surgical History:  Procedure Laterality Date   ABDOMINAL HYSTERECTOMY  2000   APPENDECTOMY     BREAST BIOPSY Right 09/19/2019   affirm bx of calcs UOQ, x marker, path pending   BREAST BIOPSY Right 09/19/2019   us  bx of mass,heart marker, path pending   BREAST BIOPSY Right 09/19/2019   us  bx of LN, coil marker, path pending   CATARACT EXTRACTION Left    CATARACT EXTRACTION W/PHACO Right 10/24/2014   Procedure: CATARACT EXTRACTION PHACO AND INTRAOCULAR LENS PLACEMENT (IOC);  Surgeon: Dene Etienne, MD;  Location: [facility];  Service: Ophthalmology;  Laterality: Right;   ESOPHAGOGASTRODUODENOSCOPY N/A 03/07/2018   Procedure: ESOPHAGOGASTRODUODENOSCOPY (EGD);  Surgeon: Unk Corinn Skiff, MD;  Location: [facility] ENDOSCOPY;  Service: Gastroenterology;  Laterality: N/A;   MASTECTOMY MODIFIED RADICAL Right 10/13/2019   Procedure: MASTECTOMY MODIFIED RADICAL;  Surgeon: Dessa Reyes ORN, MD;  Location: [facility] ORS;  Service: General;  Laterality: Right;   RENAL ANGIOGRAPHY Right 04/11/2018   Procedure: RENAL ANGIOGRAPHY;  Surgeon: Marea Selinda RAMAN, MD;  Location: [facility] INVASIVE CV LAB;  Service: Cardiovascular;  Laterality: Right;   SIMPLE MASTECTOMY WITH AXILLARY SENTINEL NODE BIOPSY Left 10/13/2019   Procedure: SIMPLE MASTECTOMY TRUE CUT BIOPSY, SENTINEL NODE BIOPSY;  Surgeon: Dessa Reyes ORN, MD;  Location: [facility] ORS;  Service: General;  Laterality: Left;    SOCIAL HISTORY: Social History   Socioeconomic History   Marital status: Widowed    Spouse name: Not on file   Number of children: Not on file   Years of education: Not on file   Highest education level: Not on file  Occupational History   Not on file  Tobacco Use   Smoking status: Never   Smokeless tobacco: Never  Vaping Use   Vaping status: Never Used  Substance and Sexual Activity   Alcohol use: No   Drug use: Never   Sexual activity: Not Currently  Other  Topics Concern   Not on file  Social History Narrative   2 daughters; lives in [address]; Runner, broadcasting/film/video retd. 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    Social Drivers of Corporate investment banker Strain: Low Risk  (11/17/2022)   Received from [facility]   Overall Financial Resource Strain (CARDIA)    Difficulty of Paying Living Expenses: Not hard at all  Food Insecurity: No Food Insecurity (06/30/2023)   Hunger Vital Sign    Worried About Running Out of Food in the Last Year: Never true    Ran Out of Food in the Last Year: Never true  Transportation Needs: No Transportation Needs (06/30/2023)   PRAPARE - Administrator, Civil Service (Medical): No    Lack of Transportation (Non-Medical): No  Physical Activity: Not on file  Stress: Not on file  Social Connections: Unknown (06/30/2023)   Social Connection and Isolation Panel    Frequency of Communication with Friends and Family: Never    Frequency of Social Gatherings with Friends and Family: Never    Attends Religious Services: Never    Database administrator or Organizations: No    Attends Banker Meetings: Never    Marital Status: Patient unable to answer  Intimate Partner Violence: Not At Risk (06/30/2023)   Humiliation, Afraid, Rape, and Kick questionnaire    Fear of Current or Ex-Partner: No    Emotionally Abused: No    Physically Abused: No    Sexually Abused: No    FAMILY HISTORY: Family History  Problem Relation Age of Onset   Hypertension Mother    Hypertension Father    Stroke Father    Breast cancer Daughter 73    ALLERGIES:  is allergic to penicillins and empagliflozin .  MEDICATIONS:  Current Outpatient Medications  Medication Sig Dispense Refill   acetaminophen  (TYLENOL ) 650 MG CR tablet Take 650 mg by mouth 3 (three) times daily.     allopurinol  (ZYLOPRIM ) 100 MG tablet Take 150 mg by mouth daily.     Alpha-D-Galactosidase (GAS-X PREVENTION PO) Take 1  capsule by mouth every 8 (eight) hours as needed.     amLODipine  (NORVASC ) 10 MG tablet Take 10 mg by mouth at bedtime.     anastrozole  (ARIMIDEX ) 1 MG tablet Take 1 tablet (1 mg total) by mouth daily. 90 tablet 1   bismuth subsalicylate (PEPTO BISMOL) 262 MG/15ML suspension Take 30 mLs by mouth every 4 (four) hours as needed.     calcium  carbonate (OS-CAL - DOSED IN MG OF ELEMENTAL CALCIUM ) 1250 (500 Ca) MG tablet Take 2 tablets by mouth daily with breakfast.     cholecalciferol  (VITAMIN D3) 25 MCG (1000 UNIT) tablet Take 1,000 Units by mouth daily.     cyanocobalamin  (VITAMIN B12) 1000 MCG tablet Take 1,000 mcg by mouth daily.     diclofenac  Sodium (VOLTAREN ) 1 % GEL Apply 2 g topically 2 (two) times daily. To bilateral legs.     docusate sodium  (COLACE) 100 MG capsule Take 100 mg by mouth daily as needed for mild constipation.     fluticasone (FLONASE) 50 MCG/ACT nasal spray Place 1 spray into both nostrils daily.     furosemide  (LASIX ) 40 MG tablet Take 0.5 tablets (20 mg total) by mouth daily. Increase to 1 tablet (40 mg total) by mouth daily (total daily dose 40 mg) as needed for up to 3 days for increased leg swelling, shortness of breath, weight gain 5+ lbs over 1-2 days. Seek medical advice if these symptoms are not improving with increased dose. (Patient taking differently: Take 40 mg by mouth daily. Increase to 1 tablet (40 mg total) by mouth daily (total daily dose 40 mg) as needed for up to 3 days for increased leg swelling, shortness of breath, weight gain 5+ lbs over 1-2 days. Seek medical advice if these symptoms are not improving with increased dose.)     guaifenesin  (ROBITUSSIN) 100 MG/5ML syrup Take 200 mg by mouth every 4 (four) hours as needed for cough.     hydrALAZINE  (APRESOLINE ) 50 MG tablet Take 1 tablet (50 mg total) by mouth 3 (three) times daily. Skip the dose if SBP less than 130 mmHg     loratadine  (CLARITIN ) 10 MG tablet Take 10 mg by mouth daily as needed for allergies.  AM     LORazepam  (ATIVAN ) 0.5 MG tablet Take 0.5 tablets (0.25 mg total) by mouth daily. 15 tablet 0   melatonin 3 MG TABS tablet Take 3 mg by mouth at bedtime.     metoprolol  succinate (TOPROL -XL) 25 MG 24 hr tablet Take 1 tablet (25 mg total) by mouth daily.     polyethylene glycol (MIRALAX  / GLYCOLAX ) 17 g packet Take 17 g by mouth daily. Skip the dose if no constipation     senna (SENOKOT) 8.6 MG TABS tablet Take 1 tablet by mouth every other day.     sertraline (ZOLOFT) 100 MG tablet Take 100 mg by mouth daily.     sodium bicarbonate  650 MG tablet Take 650 mg by mouth  every 8 (eight) hours as needed for heartburn.     valsartan (DIOVAN) 160 MG tablet Take 160 mg by mouth daily.     Wheat Dextrin (BENEFIBER ON THE GO) PACK Take 1 packet by mouth daily as needed.     pregabalin  (LYRICA ) 50 MG capsule Take 1 capsule (50 mg total) by mouth at bedtime. 90 capsule 1   No current facility-administered medications for this visit.      SABRA  PHYSICAL EXAMINATION: ECOG PERFORMANCE STATUS: 0 - Asymptomatic  Vitals:   11/30/23 0955 11/30/23 1020  BP: (!) 147/62 (!) 148/61  Pulse: 69   Resp: 16   Temp: (!) 96.6 F (35.9 C)   SpO2: 95%       Filed Weights   11/30/23 0955  Weight: 158 lb (71.7 kg)       Physical Exam Constitutional:      Comments: Walks with a rolling walker.  Alone.  HENT:     Head: Normocephalic and atraumatic.     Mouth/Throat:     Pharynx: No oropharyngeal exudate.  Eyes:     Pupils: Pupils are equal, round, and reactive to light.  Cardiovascular:     Rate and Rhythm: Normal rate and regular rhythm.  Pulmonary:     Effort: Pulmonary effort is normal. No respiratory distress.     Breath sounds: Normal breath sounds. No wheezing.  Abdominal:     General: Bowel sounds are normal. There is no distension.     Palpations: Abdomen is soft. There is no mass.     Tenderness: There is no abdominal tenderness. There is no guarding or rebound.  Musculoskeletal:         General: No tenderness. Normal range of motion.     Cervical back: Normal range of motion and neck supple.  Skin:    General: Skin is warm.     Comments: Bruising bruising noted on the chest and left arm.  Neurological:     Mental Status: She is alert and oriented to person, place, and time.  Psychiatric:        Mood and Affect: Affect normal.     Results for QUANASIA, DEFINO (MRN [id number]) as of 01/15/2021 15:37  Ref. Range 08/27/2020 09:55 09/02/2020 12:32 10/22/2020 14:01 12/04/2020 14:35 01/15/2021 14:50  CA 15-3 Latest Ref Range: 0.0 - 25.0 U/mL   32.0 (H) 27.9 (H)   CA 27.29 Latest Ref Range: 0.0 - 38.6 U/mL   38.0 27.8   CEA Latest Ref Range: 0.0 - 4.7 ng/mL   4.1 3.6    LABORATORY DATA:  I have reviewed the data as listed Lab Results  Component Value Date   WBC 7.6 11/30/2023   HGB 11.1 (L) 11/30/2023   HCT 33.4 (L) 11/30/2023   MCV 91.8 11/30/2023   PLT 174 11/30/2023   Recent Labs    06/29/23 0823 06/30/23 0452 07/03/23 0454 07/05/23 0000 08/27/23 1313 09/06/23 0000 09/09/23 0000 10/18/23 0000 11/30/23 0955  NA 139   < > 140   < > 138  --  141 140 142  K 4.0   < > 3.7   < > 3.9  --  4.7 4.0 3.9  CL 107   < > 105   < > 101  --  112* 105 113*  CO2 22   < > 25   < > 26  --  21 25* 20*  GLUCOSE 162*   < > 113*  --  152*  --   --   --  186*  BUN 27*   < > 28*   < > 42*  --  28* 19 46*  CREATININE 1.54*   < > 1.56*   < > 1.86*  --  1.4* 1.9* 2.13*  CALCIUM  8.9   < > 9.5   < > 12.3*   < > 8.7 9.6 10.0  GFRNONAA 31*   < > 31*  --  25*  --   --   --  21*  PROT 7.0  --   --   --  8.3*  --   --   --  8.1  ALBUMIN  3.5  --   --   --  4.1  --  3.9  --  4.3  AST 21  --   --   --  25  --  18  --  25  ALT 11  --   --   --  19  --  10  --  15  ALKPHOS 61  --   --   --  91  --  72  --  79  BILITOT 0.7  --   --   --  0.6  --   --   --  0.6   < > = values in this interval not displayed.    RADIOGRAPHIC STUDIES: I have personally reviewed the radiological images as listed  and agreed with the findings in the report. No results found.   ASSESSMENT & PLAN:   Carcinoma of upper-inner quadrant of right breast in female, estrogen receptor positive (HCC)  # 2021-Synchronous bilateral breast cancer-status post bilateral mastectomies- Right Breast-stage III ER/PR positive HER-2 NEG; & Left breast cancer stage II ER/PR Pos; Her 2 NEG. PET scan- April 20th, 2023-  Interval bilateral mastectomy and right axillary node dissection. No evidence of chest wall recurrence or thoracic metastatic disease.  Questionable small hypermetabolic lesion centrally in the right hepatic lobe; MRI LIVER  MAY 25th, 2023- NEGATIVE for any cancer; but cysts [see below].    # CONTINUE  anastrozole  [started in June 2021]. Recommend continue  5- 10 years.  No clinical progression of disease or recurrence of breast cancer, however slightly elevated TM- will get PET scan for further evaluation.   # chronic headaches-/tremors/peripheral neuropathy ? stanly Dr.Shah > 4 years];  MRI- Brain May  2023-  No acute intracranial pathology. S/p evaluation with Dr.Shah- on baclofen. Stable.   # JAN 2024- [hospital] acute hypoxic respiratory failure. JAn 2024- CT chest showed chronic findings including radiation fibrosis, emphysema, pulmonary hypertension-patient currently weaned off oxygen- stable  # Elevated BG-[Dr.Baboff- HbA1C- 7.4]; not on medications.  Defer to PCP re:  management of DM- stable   # Mild-moderate arthritic pain-osteoarthritis [bil knee L >R] G-1-2; continue tylenol  prn.  Stable.   # Hypercalcemia- vit D def [on ergocalciferol ]--ca- 12.3- HOLD vitD+ ca. Discussed with Dr.Beamer.  Proceed with Prolia  today.  Repeat calcium  in 1 week.  # Fatigue ? Etiology; s/p Evaluation with [facility]- s/p CARE program- Stable.   # CKD- Stage- IV- GFR 29 [Dr.Kolluru]; -Stable.   # NOV 2021- OsteoporosisT-scoreof -3.7 Prolia  q 6 months [Feb 2023]- BMD- April 2024- stable.  OFF ca+vit D- see above  # MAY  2023- [liver MRI]-  Multi cystic lesions of the pancreas with enlargement measuring 2.6 cm greatest axial dimension; with relative stability since 2021. without high-risk features aside from enlargement since 2019. Recommend in may in 2025- HOLD off for now given multiple medical issues/age etc. Stable.  Prolia  q 65m- 08/27/2023-   # DISPOSITION:  # follow up in 6 weeks- - MD; labs- cbc/cmp; ca27;29; CEA; ca15-3  vit D;  PET scan--  Dr.B    Cindy JONELLE Joe, MD 11/30/2023 12:07 PM

## 2023-11-30 NOTE — Assessment & Plan Note (Addendum)
#   2021-Synchronous bilateral breast cancer-status post bilateral mastectomies- Right Breast-stage III ER/PR positive HER-2 NEG; & Left breast cancer stage II ER/PR Pos; Her 2 NEG. PET scan- April 20th, 2023-  Interval bilateral mastectomy and right axillary node dissection. No evidence of chest wall recurrence or thoracic metastatic disease.  Questionable small hypermetabolic lesion centrally in the right hepatic lobe; MRI LIVER  MAY 25th, 2023- NEGATIVE for any cancer; but cysts [see below].    # CONTINUE  anastrozole  [started in June 2021]. Recommend continue  5- 10 years.  No clinical progression of disease or recurrence of breast cancer, however slightly elevated TM- will get PET scan for further evaluation.   # chronic headaches-/tremors/peripheral neuropathy ? stanly Dr.Shah > 4 years];  MRI- Brain May  2023-  No acute intracranial pathology. S/p evaluation with Dr.Shah- on baclofen. Stable.   # JAN 2024- [hospital] acute hypoxic respiratory failure. JAn 2024- CT chest showed chronic findings including radiation fibrosis, emphysema, pulmonary hypertension-patient currently weaned off oxygen- stable  # Elevated BG-[Dr.Baboff- HbA1C- 7.4]; not on medications.  Defer to PCP re:  management of DM- stable   # Mild-moderate arthritic pain-osteoarthritis [bil knee L >R] G-1-2; continue tylenol  prn.  Stable.   # Hypercalcemia- vit D def [on ergocalciferol ]--ca- 12.3- HOLD vitD+ ca. Discussed with Dr.Beamer.  Proceed with Prolia  today.  Repeat calcium  in 1 week.  # Fatigue ? Etiology; s/p Evaluation with [facility]- s/p CARE program- Stable.   # CKD- Stage- IV- GFR 29 [Dr.Kolluru]; -Stable.   # NOV 2021- OsteoporosisT-scoreof -3.7 Prolia  q 6 months [Feb 2023]- BMD- April 2024- stable.  OFF ca+vit D- see above  # MAY 2023- [liver MRI]-  Multi cystic lesions of the pancreas with enlargement measuring 2.6 cm greatest axial dimension; with relative stability since 2021. without high-risk features aside  from enlargement since 2019. Recommend in may in 2025- HOLD off for now given multiple medical issues/age etc. Stable.    Prolia  q 32m- 08/27/2023-   # DISPOSITION:  # no prolia  today # follow up in 6 weeks- - MD; labs- cbc/cmp; ca27;29; CEA; ca15-3  vit D;  PET scan--  Dr.B

## 2023-12-01 ENCOUNTER — Encounter: Payer: Self-pay | Admitting: Student

## 2023-12-01 ENCOUNTER — Non-Acute Institutional Stay (SKILLED_NURSING_FACILITY): Payer: Self-pay | Admitting: Student

## 2023-12-01 DIAGNOSIS — K529 Noninfective gastroenteritis and colitis, unspecified: Secondary | ICD-10-CM

## 2023-12-01 DIAGNOSIS — F419 Anxiety disorder, unspecified: Secondary | ICD-10-CM

## 2023-12-01 DIAGNOSIS — R413 Other amnesia: Secondary | ICD-10-CM | POA: Diagnosis not present

## 2023-12-01 DIAGNOSIS — N1832 Type 2 diabetes mellitus with diabetic chronic kidney disease: Secondary | ICD-10-CM

## 2023-12-01 DIAGNOSIS — C50211 Malignant neoplasm of upper-inner quadrant of right female breast: Secondary | ICD-10-CM

## 2023-12-01 DIAGNOSIS — E1122 Type 2 diabetes mellitus with diabetic chronic kidney disease: Secondary | ICD-10-CM | POA: Diagnosis not present

## 2023-12-01 DIAGNOSIS — Z170 Estrogen receptor positive status [ER+]: Secondary | ICD-10-CM

## 2023-12-01 LAB — CANCER ANTIGEN 15-3: CA 15-3: 38.6 U/mL — ABNORMAL HIGH (ref 0.0–25.0)

## 2023-12-01 LAB — CANCER ANTIGEN 27.29: CA 27.29: 57.4 U/mL — ABNORMAL HIGH (ref 0.0–38.6)

## 2023-12-01 LAB — CEA: CEA: 5.8 ng/mL — ABNORMAL HIGH (ref 0.0–4.7)

## 2023-12-01 NOTE — Progress Notes (Unsigned)
 Location:  Other [facility] Room Number: [place] [id number] Place of Service:  SNF [phone]) Provider:  Abdul Fine, MD  Patient Care Team: Abdul Fine, MD as PCP - General (Family Medicine) Rennie Cindy SAUNDERS, MD as Consulting Physician (Oncology) Dessa, Reyes ORN, MD as Consulting Physician (General Surgery) Dannielle Arlean FALCON, RN (Inactive) as Oncology Nurse Navigator Lenn Aran, MD as Referring Physician (Radiation Oncology)  Extended Emergency Contact Information Primary Emergency Contact: [facility] Phone: [phone] Relation: Daughter Secondary Emergency Contact: Hicklin,Roxanne Mobile Phone: [phone] Relation: Niece  Code Status:  DNR Goals of care: Advanced Directive information    11/30/2023    9:55 AM  Advanced Directives  Does Patient Have a Medical Advance Directive? Yes     Chief Complaint  Patient presents with   Diarrhea    Diarrhea    HPI:  Pt is a 89 y.o. female seen today for an acute visit for Diarrhea.    Past Medical History:  Diagnosis Date   Arthritis    Gout   Chronic kidney disease    Diarrhea    Hypercholesteremia    Hypertension    Neuropathy    feet and lower legs   Seasonal allergies    Skin cancer    face   Past Surgical History:  Procedure Laterality Date   ABDOMINAL HYSTERECTOMY  2000   APPENDECTOMY     BREAST BIOPSY Right 09/19/2019   affirm bx of calcs UOQ, x marker, path pending   BREAST BIOPSY Right 09/19/2019   us  bx of mass,heart marker, path pending   BREAST BIOPSY Right 09/19/2019   us  bx of LN, coil marker, path pending   CATARACT EXTRACTION Left    CATARACT EXTRACTION W/PHACO Right 10/24/2014   Procedure: CATARACT EXTRACTION PHACO AND INTRAOCULAR LENS PLACEMENT (IOC);  Surgeon: Dene Etienne, MD;  Location: [facility];  Service: Ophthalmology;  Laterality: Right;   ESOPHAGOGASTRODUODENOSCOPY N/A 03/07/2018   Procedure: ESOPHAGOGASTRODUODENOSCOPY (EGD);   Surgeon: Unk Corinn Skiff, MD;  Location: [facility] ENDOSCOPY;  Service: Gastroenterology;  Laterality: N/A;   MASTECTOMY MODIFIED RADICAL Right 10/13/2019   Procedure: MASTECTOMY MODIFIED RADICAL;  Surgeon: Dessa Reyes ORN, MD;  Location: [facility] ORS;  Service: General;  Laterality: Right;   RENAL ANGIOGRAPHY Right 04/11/2018   Procedure: RENAL ANGIOGRAPHY;  Surgeon: Marea Selinda RAMAN, MD;  Location: [facility] INVASIVE CV LAB;  Service: Cardiovascular;  Laterality: Right;   SIMPLE MASTECTOMY WITH AXILLARY SENTINEL NODE BIOPSY Left 10/13/2019   Procedure: SIMPLE MASTECTOMY TRUE CUT BIOPSY, SENTINEL NODE BIOPSY;  Surgeon: Dessa Reyes ORN, MD;  Location: [facility] ORS;  Service: General;  Laterality: Left;    Allergies  Allergen Reactions   Penicillins Anaphylaxis    Patient tolerates ceftriaxone    Empagliflozin  Rash    Outpatient Encounter Medications as of 12/01/2023  Medication Sig   acetaminophen  (TYLENOL ) 500 MG tablet Take 1,000 mg by mouth 3 (three) times daily.   allopurinol  (ZYLOPRIM ) 100 MG tablet Take 150 mg by mouth daily.   Alpha-D-Galactosidase (GAS-X PREVENTION PO) Take 1 capsule by mouth every 8 (eight) hours as needed.   amLODipine  (NORVASC ) 10 MG tablet Take 10 mg by mouth at bedtime.   anastrozole  (ARIMIDEX ) 1 MG tablet Take 1 tablet (1 mg total) by mouth daily.   bismuth subsalicylate (PEPTO BISMOL) 262 MG/15ML suspension Take 30 mLs by mouth every 4 (four) hours as needed.   calcium  carbonate (OS-CAL - DOSED IN MG OF ELEMENTAL CALCIUM ) 1250 (500 Ca) MG tablet Take 2 tablets by mouth  daily with breakfast.   cholecalciferol  (VITAMIN D3) 25 MCG (1000 UNIT) tablet Take 1,000 Units by mouth daily.   cyanocobalamin  (VITAMIN B12) 1000 MCG tablet Take 1,000 mcg by mouth daily.   diclofenac  Sodium (VOLTAREN ) 1 % GEL Apply 2 g topically 2 (two) times daily. To bilateral legs.   docusate sodium  (COLACE) 100 MG capsule Take 100 mg by mouth daily as needed for mild constipation.   fluticasone  (FLONASE) 50 MCG/ACT nasal spray Place 1 spray into both nostrils daily.   furosemide  (LASIX ) 40 MG tablet Take 0.5 tablets (20 mg total) by mouth daily. Increase to 1 tablet (40 mg total) by mouth daily (total daily dose 40 mg) as needed for up to 3 days for increased leg swelling, shortness of breath, weight gain 5+ lbs over 1-2 days. Seek medical advice if these symptoms are not improving with increased dose.   guaifenesin  (ROBITUSSIN) 100 MG/5ML syrup Take 200 mg by mouth every 4 (four) hours as needed for cough.   hydrALAZINE  (APRESOLINE ) 50 MG tablet Take 1 tablet (50 mg total) by mouth 3 (three) times daily. Skip the dose if SBP less than 130 mmHg   loratadine  (CLARITIN ) 10 MG tablet Take 10 mg by mouth daily as needed for allergies. AM   LORazepam  (ATIVAN ) 0.5 MG tablet Take 0.5 tablets (0.25 mg total) by mouth daily.   melatonin 3 MG TABS tablet Take 3 mg by mouth at bedtime.   metoprolol  succinate (TOPROL -XL) 25 MG 24 hr tablet Take 1 tablet (25 mg total) by mouth daily.   OXYGEN Inhale into the lungs. 2lpm   polyethylene glycol (MIRALAX  / GLYCOLAX ) 17 g packet Take 17 g by mouth daily. Skip the dose if no constipation   pregabalin  (LYRICA ) 50 MG capsule Take 1 capsule (50 mg total) by mouth at bedtime.   psyllium (METAMUCIL) 58.6 % powder Take 1 packet by mouth daily.   senna (SENOKOT) 8.6 MG TABS tablet Take 1 tablet by mouth every other day.   sertraline (ZOLOFT) 100 MG tablet Take 100 mg by mouth daily.   sodium bicarbonate  650 MG tablet Take 650 mg by mouth every 8 (eight) hours as needed for heartburn.   valsartan (DIOVAN) 160 MG tablet Take 160 mg by mouth daily.   Wheat Dextrin (BENEFIBER ON THE GO) PACK Take 1 packet by mouth daily as needed.   acetaminophen  (TYLENOL ) 650 MG CR tablet Take 650 mg by mouth 3 (three) times daily. (Patient not taking: Reported on 12/01/2023)   No facility-administered encounter medications on file as of 12/01/2023.    Review of  Systems  Immunization History  Administered Date(s) Administered   Influenza Inj Mdck Quad Pf 03/26/2016, 02/14/2018, 02/15/2019, 02/25/2021, 03/02/2022   Influenza Split 02/20/2015   Influenza, Seasonal, Injecte, Preservative Fre 04/13/2012, 03/17/2013   Influenza,inj,Quad PF,6+ Mos 03/07/2014, 02/20/2020   Influenza-Unspecified 03/26/2016, 02/04/2017, 02/14/2018, 02/25/2021, 04/12/2023   Moderna Sars-Covid-2 Vaccination 05/24/2019, 06/24/2019, 02/22/2020, 02/26/2022   PNEUMOCOCCAL CONJUGATE-20 04/09/2023   Pneumococcal Conjugate-13 09/01/2013   Pneumococcal Polysaccharide-23 04/13/2012   RSV,unspecified 04/09/2023   Td 08/07/2010   Tdap 08/07/2010   Zoster, Live 09/12/2007   Pertinent  Health Maintenance Due  Topic Date Due   FOOT EXAM  Never done   OPHTHALMOLOGY EXAM  Never done   INFLUENZA VACCINE  12/10/2023   HEMOGLOBIN A1C  03/15/2024   DEXA SCAN  Completed      01/09/2022   10:55 AM 02/18/2022    1:36 PM 03/18/2022    4:19 PM  03/18/2022    4:46 PM 05/13/2022   10:17 AM  Fall Risk  (RETIRED) Patient Fall Risk Level High fall risk  High fall risk  Moderate fall risk  Moderate fall risk  High fall risk      Data saved with a previous flowsheet row definition   Functional Status Survey:    Vitals:   12/01/23 1154  BP: (!) 146/73  Pulse: 69  Resp: 16  Temp: 98 F (36.7 C)  SpO2: 95%  Weight: 162 lb 12.8 oz (73.8 kg)  Height: 5' 2 (1.575 m)   Body mass index is 29.78 kg/m. Physical Exam  Labs reviewed: Recent Labs    04/01/23 0519 04/12/23 0000 05/08/23 0402 05/09/23 0436 05/10/23 0210 05/11/23 0515 07/03/23 0454 07/05/23 0000 08/27/23 1313 09/06/23 0000 10/18/23 0000 11/18/23 0000 11/30/23 0955  NA 138   < > 136 135 133*   < > 140   < > 138   < > 140 142 142  K 4.4   < > 3.7 3.8 4.0   < > 3.7   < > 3.9   < > 4.0 3.9 3.9  CL 107   < > 108 106 105   < > 105   < > 101   < > 105 107 113*  CO2 21*   < > 19* 18* 19*   < > 25   < > 26   < > 25* 25*  20*  GLUCOSE 156*   < > 136* 119* 137*   < > 113*  --  152*  --   --   --  186*  BUN 39*   < > 31* 34* 39*   < > 28*   < > 42*   < > 19 63* 46*  CREATININE 1.58*   < > 1.31* 1.47* 1.56*   < > 1.56*   < > 1.86*   < > 1.9* 2.4* 2.13*  CALCIUM  8.5*   < > 8.9 8.8* 8.6*   < > 9.5   < > 12.3*   < > 9.6 10.4 10.0  MG 2.4  --  1.7 2.0  --   --   --   --   --   --   --   --   --   PHOS 3.0  --  3.3 3.4 2.9  --   --   --   --   --   --   --   --    < > = values in this interval not displayed.   Recent Labs    06/29/23 0823 08/27/23 1313 09/09/23 0000 11/30/23 0955  AST 21 25 18 25   ALT 11 19 10 15   ALKPHOS 61 91 72 79  BILITOT 0.7 0.6  --  0.6  PROT 7.0 8.3*  --  8.1  ALBUMIN  3.5 4.1 3.9 4.3   Recent Labs    07/01/23 0510 07/08/23 0000 08/27/23 1313 09/09/23 0000 11/30/23 0955  WBC 7.1 7.2 9.0 7.5 7.6  NEUTROABS  --  3,262.00 4.9  --  4.1  HGB 9.6* 9.8* 12.3 10.7* 11.1*  HCT 28.4* 31* 36.9 34* 33.4*  MCV 91.0  --  90.0  --  91.8  PLT 233 253 240 222 174   Lab Results  Component Value Date   TSH 2.573 08/26/2020   Lab Results  Component Value Date   HGBA1C 8.0 09/13/2023   Lab Results  Component Value Date   CHOL 75  08/27/2022   HDL 22 (L) 08/27/2022   LDLCALC 34 08/27/2022   TRIG 146 05/08/2023   CHOLHDL 3.4 08/27/2022    Significant Diagnostic Results in last 30 days:  No results found.  Assessment/Plan There are no diagnoses linked to this encounter.   Family/ staff Communication: ***  Labs/tests ordered:  ***

## 2023-12-02 ENCOUNTER — Encounter: Payer: Self-pay | Admitting: Student

## 2023-12-03 LAB — HM DIABETES EYE EXAM

## 2023-12-06 ENCOUNTER — Encounter: Payer: Self-pay | Admitting: Student

## 2023-12-06 ENCOUNTER — Other Ambulatory Visit: Payer: Self-pay | Admitting: Adult Health

## 2023-12-06 DIAGNOSIS — F419 Anxiety disorder, unspecified: Secondary | ICD-10-CM

## 2023-12-06 NOTE — Telephone Encounter (Signed)
 Patient is requesting a refill of the following medications: Requested Prescriptions   Pending Prescriptions Disp Refills   LORazepam  (ATIVAN ) 0.5 MG tablet [Pharmacy Med Name: LORazepam  0.5 MG Tablet] 15 tablet 10    Sig: TAKE 1/2 TABLET =0.25MG  BY MOUTH ONCE DAILY    Date of last refill: 11/10/23  Refill amount: 10  Treatment agreement date: SNF Patient at [facility]

## 2023-12-07 ENCOUNTER — Other Ambulatory Visit: Payer: Self-pay | Admitting: Nurse Practitioner

## 2023-12-07 DIAGNOSIS — F419 Anxiety disorder, unspecified: Secondary | ICD-10-CM

## 2023-12-07 MED ORDER — LORAZEPAM 0.5 MG PO TABS: 0.2500 mg | ORAL_TABLET | Freq: Every day | ORAL | 1 refills | Status: DC

## 2023-12-07 NOTE — Telephone Encounter (Signed)
 Amlodipine  has been discontinued so refill was refused. Will need to follow up in clinic to evaluate need for Amlodipine  if wanting to re-start it.

## 2023-12-07 NOTE — Telephone Encounter (Signed)
 Pls disregard my  previous note regarding Amlodipine , error.

## 2023-12-14 ENCOUNTER — Encounter: Payer: Self-pay | Admitting: Student

## 2023-12-17 ENCOUNTER — Encounter: Payer: Self-pay | Admitting: Student

## 2023-12-17 ENCOUNTER — Non-Acute Institutional Stay (SKILLED_NURSING_FACILITY): Payer: Self-pay | Admitting: Student

## 2023-12-17 DIAGNOSIS — N184 Chronic kidney disease, stage 4 (severe): Secondary | ICD-10-CM

## 2023-12-17 DIAGNOSIS — L219 Seborrheic dermatitis, unspecified: Secondary | ICD-10-CM

## 2023-12-17 DIAGNOSIS — K529 Noninfective gastroenteritis and colitis, unspecified: Secondary | ICD-10-CM | POA: Diagnosis not present

## 2023-12-17 DIAGNOSIS — G8929 Other chronic pain: Secondary | ICD-10-CM

## 2023-12-17 DIAGNOSIS — I5031 Acute diastolic (congestive) heart failure: Secondary | ICD-10-CM

## 2023-12-17 DIAGNOSIS — L600 Ingrowing nail: Secondary | ICD-10-CM

## 2023-12-17 NOTE — Progress Notes (Signed)
 Location:  Other [place]  Nursing Home Room Number: [facility] [id number] Place of Service:  SNF [phone]) Provider:  Abdul Fine, MD  Patient Care Team: Abdul Fine, MD as PCP - General (Family Medicine) Rennie Cindy SAUNDERS, MD as Consulting Physician (Oncology) Dessa, Reyes ORN, MD as Consulting Physician (General Surgery) Dannielle Arlean FALCON, RN (Inactive) as Oncology Nurse Navigator Lenn Aran, MD as Referring Physician (Radiation Oncology)  Extended Emergency Contact Information Primary Emergency Contact: [facility] Phone: [phone] Relation: Daughter Secondary Emergency Contact: Hicklin,Roxanne Mobile Phone: [phone] Relation: Niece  Code Status:  DNR Goals of care: Advanced Directive information    11/30/2023    9:55 AM  Advanced Directives  Does Patient Have a Medical Advance Directive? Yes     Chief Complaint  Patient presents with   Diarrhea    Diarrhea    HPI:  Pt is a 89 y.o. female seen today for an routine visit and Diarrhea History of Present Illness The patient is a 89 year old who presents with concerns about diarrhea and foot pain.  She has been experiencing intermittent diarrhea for a long time, which has recently improved but is not completely resolved. There have been no new medications introduced, and she has not needed to use Imodium, which was prescribed for instances of having three or more episodes a day.  She reports pain in the second toe on one foot and soreness on the side of her big toe, suspecting that the toenail might be causing discomfort, especially when standing. A Band-Aid applied by a nurse provided minimal relief.  She mentions severe itching and inquires about a shampoo that might help alleviate the itchiness. She has visited her dermatologist, who advised not to worry about certain skin characteristics that have appeared with age.  She wants to resume walking and physical activity, expressing concern  about regressing if she does not exercise. She recalls feeling better and experiencing less pain when she was able to walk to therapy. She was previously involved in restorative therapy but has not participated since having a foot wound.  Past Medical History:  Diagnosis Date   Arthritis    Gout   Chronic kidney disease    Diarrhea    Hypercholesteremia    Hypertension    Neuropathy    feet and lower legs   Seasonal allergies    Skin cancer    face   Past Surgical History:  Procedure Laterality Date   ABDOMINAL HYSTERECTOMY  2000   APPENDECTOMY     BREAST BIOPSY Right 09/19/2019   affirm bx of calcs UOQ, x marker, path pending   BREAST BIOPSY Right 09/19/2019   us  bx of mass,heart marker, path pending   BREAST BIOPSY Right 09/19/2019   us  bx of LN, coil marker, path pending   CATARACT EXTRACTION Left    CATARACT EXTRACTION W/PHACO Right 10/24/2014   Procedure: CATARACT EXTRACTION PHACO AND INTRAOCULAR LENS PLACEMENT (IOC);  Surgeon: Dene Etienne, MD;  Location: [facility];  Service: Ophthalmology;  Laterality: Right;   ESOPHAGOGASTRODUODENOSCOPY N/A 03/07/2018   Procedure: ESOPHAGOGASTRODUODENOSCOPY (EGD);  Surgeon: Unk Corinn Skiff, MD;  Location: [facility] ENDOSCOPY;  Service: Gastroenterology;  Laterality: N/A;   MASTECTOMY MODIFIED RADICAL Right 10/13/2019   Procedure: MASTECTOMY MODIFIED RADICAL;  Surgeon: Dessa Reyes ORN, MD;  Location: [facility] ORS;  Service: General;  Laterality: Right;   RENAL ANGIOGRAPHY Right 04/11/2018   Procedure: RENAL ANGIOGRAPHY;  Surgeon: Marea Selinda RAMAN, MD;  Location: [facility] INVASIVE CV LAB;  Service: Cardiovascular;  Laterality: Right;   SIMPLE MASTECTOMY WITH AXILLARY SENTINEL NODE BIOPSY Left 10/13/2019   Procedure: SIMPLE MASTECTOMY TRUE CUT BIOPSY, SENTINEL NODE BIOPSY;  Surgeon: Dessa Reyes ORN, MD;  Location: [facility] ORS;  Service: General;  Laterality: Left;    Allergies  Allergen Reactions   Penicillins Anaphylaxis    Patient  tolerates ceftriaxone    Empagliflozin  Rash    Outpatient Encounter Medications as of 12/17/2023  Medication Sig   acetaminophen  (TYLENOL ) 500 MG tablet Take 1,000 mg by mouth 3 (three) times daily.   allopurinol  (ZYLOPRIM ) 100 MG tablet Take 150 mg by mouth daily.   Alpha-D-Galactosidase (GAS-X PREVENTION PO) Take 1 capsule by mouth every 8 (eight) hours as needed.   amLODipine  (NORVASC ) 10 MG tablet Take 10 mg by mouth at bedtime.   anastrozole  (ARIMIDEX ) 1 MG tablet Take 1 tablet (1 mg total) by mouth daily.   bismuth subsalicylate (PEPTO BISMOL) 262 MG/15ML suspension Take 30 mLs by mouth every 4 (four) hours as needed.   calcium  carbonate (OSCAL) 1500 (600 Ca) MG TABS tablet Take 1 tablet by mouth 2 (two) times daily with a meal.   cholecalciferol  (VITAMIN D3) 25 MCG (1000 UNIT) tablet Take 1,000 Units by mouth daily.   cyanocobalamin  (VITAMIN B12) 1000 MCG tablet Take 1,000 mcg by mouth daily.   diclofenac  Sodium (VOLTAREN ) 1 % GEL Apply 2 g topically 2 (two) times daily. To bilateral legs.   fluticasone (FLONASE) 50 MCG/ACT nasal spray Place 1 spray into both nostrils daily.   furosemide  (LASIX ) 40 MG tablet Take 0.5 tablets (20 mg total) by mouth daily. Increase to 1 tablet (40 mg total) by mouth daily (total daily dose 40 mg) as needed for up to 3 days for increased leg swelling, shortness of breath, weight gain 5+ lbs over 1-2 days. Seek medical advice if these symptoms are not improving with increased dose. (Patient taking differently: Take 40 mg by mouth daily. Give 40mg  by mouth every 24 hours as needed for weight gain >3lb overnight or 5lb in one week)   guaifenesin  (ROBITUSSIN) 100 MG/5ML syrup Take 200 mg by mouth every 4 (four) hours as needed for cough.   hydrALAZINE  (APRESOLINE ) 50 MG tablet Take 1 tablet (50 mg total) by mouth 3 (three) times daily. Skip the dose if SBP less than 130 mmHg   loperamide (IMODIUM) 2 MG capsule Take 2 mg by mouth every 8 (eight) hours as needed for  diarrhea or loose stools.   loratadine  (CLARITIN ) 10 MG tablet Take 10 mg by mouth daily as needed for allergies. AM   LORazepam  (ATIVAN ) 0.5 MG tablet Take 0.5 tablets (0.25 mg total) by mouth daily.   melatonin 3 MG TABS tablet Take 3 mg by mouth at bedtime.   metoprolol  succinate (TOPROL -XL) 25 MG 24 hr tablet Take 1 tablet (25 mg total) by mouth daily.   OXYGEN Inhale into the lungs. 2lpm   polyethylene glycol (MIRALAX  / GLYCOLAX ) 17 g packet Take 17 g by mouth daily. Skip the dose if no constipation   pregabalin  (LYRICA ) 50 MG capsule Take 1 capsule (50 mg total) by mouth at bedtime.   senna (SENOKOT) 8.6 MG TABS tablet Take 1 tablet by mouth every other day.   sertraline (ZOLOFT) 100 MG tablet Take 100 mg by mouth daily.   sodium bicarbonate  650 MG tablet Take 650 mg by mouth every 8 (eight) hours as needed for heartburn.   valsartan (DIOVAN) 160 MG tablet Take 160 mg by mouth daily.   Wheat  Dextrin (BENEFIBER ON THE GO) PACK Take 1 packet by mouth daily as needed.   docusate sodium  (COLACE) 100 MG capsule Take 100 mg by mouth daily as needed for mild constipation. (Patient not taking: Reported on 12/17/2023)   psyllium (METAMUCIL) 58.6 % powder Take 1 packet by mouth daily. (Patient not taking: Reported on 12/17/2023)   [DISCONTINUED] calcium  carbonate (OS-CAL - DOSED IN MG OF ELEMENTAL CALCIUM ) 1250 (500 Ca) MG tablet Take 2 tablets by mouth daily with breakfast.   No facility-administered encounter medications on file as of 12/17/2023.    Review of Systems  Immunization History  Administered Date(s) Administered   Influenza Inj Mdck Quad Pf 03/26/2016, 02/14/2018, 02/15/2019, 02/25/2021, 03/02/2022   Influenza Split 02/20/2015   Influenza, Seasonal, Injecte, Preservative Fre 04/13/2012, 03/17/2013   Influenza,inj,Quad PF,6+ Mos 03/07/2014, 02/20/2020   Influenza-Unspecified 03/26/2016, 02/04/2017, 02/14/2018, 02/25/2021, 04/12/2023   Moderna Sars-Covid-2 Vaccination 05/24/2019,  06/24/2019, 02/22/2020, 02/26/2022   PNEUMOCOCCAL CONJUGATE-20 04/09/2023   Pneumococcal Conjugate-13 09/01/2013   Pneumococcal Polysaccharide-23 04/13/2012   RSV,unspecified 04/09/2023   Td 08/07/2010   Tdap 08/07/2010   Zoster, Live 09/12/2007   Pertinent  Health Maintenance Due  Topic Date Due   FOOT EXAM  Never done   INFLUENZA VACCINE  12/10/2023   HEMOGLOBIN A1C  03/15/2024   OPHTHALMOLOGY EXAM  12/02/2024   DEXA SCAN  Completed      01/09/2022   10:55 AM 02/18/2022    1:36 PM 03/18/2022    4:19 PM 03/18/2022    4:46 PM 05/13/2022   10:17 AM  Fall Risk  (RETIRED) Patient Fall Risk Level High fall risk  High fall risk  Moderate fall risk  Moderate fall risk  High fall risk      Data saved with a previous flowsheet row definition   Functional Status Survey:    Vitals:   12/17/23 1134 12/17/23 1147  BP: (!) 155/77 130/80  Pulse: 87   Resp: 16   Temp: 98 F (36.7 C)   SpO2: 95%   Weight: 158 lb (71.7 kg)   Height: 5' 2 (1.575 m)    Body mass index is 28.9 kg/m. Physical Exam Cardiovascular:     Rate and Rhythm: Normal rate.     Pulses: Normal pulses.  Pulmonary:     Effort: Pulmonary effort is normal.  Neurological:     Mental Status: She is alert and oriented to person, place, and time.     Labs reviewed: Recent Labs    04/01/23 0519 04/12/23 0000 05/08/23 0402 05/09/23 0436 05/10/23 0210 05/11/23 0515 07/03/23 0454 07/05/23 0000 08/27/23 1313 09/06/23 0000 10/18/23 0000 11/18/23 0000 11/30/23 0955  NA 138   < > 136 135 133*   < > 140   < > 138   < > 140 142 142  K 4.4   < > 3.7 3.8 4.0   < > 3.7   < > 3.9   < > 4.0 3.9 3.9  CL 107   < > 108 106 105   < > 105   < > 101   < > 105 107 113*  CO2 21*   < > 19* 18* 19*   < > 25   < > 26   < > 25* 25* 20*  GLUCOSE 156*   < > 136* 119* 137*   < > 113*  --  152*  --   --   --  186*  BUN 39*   < > 31* 34* 39*   < >  28*   < > 42*   < > 19 63* 46*  CREATININE 1.58*   < > 1.31* 1.47* 1.56*   < > 1.56*    < > 1.86*   < > 1.9* 2.4* 2.13*  CALCIUM  8.5*   < > 8.9 8.8* 8.6*   < > 9.5   < > 12.3*   < > 9.6 10.4 10.0  MG 2.4  --  1.7 2.0  --   --   --   --   --   --   --   --   --   PHOS 3.0  --  3.3 3.4 2.9  --   --   --   --   --   --   --   --    < > = values in this interval not displayed.   Recent Labs    06/29/23 0823 08/27/23 1313 09/09/23 0000 11/30/23 0955  AST 21 25 18 25   ALT 11 19 10 15   ALKPHOS 61 91 72 79  BILITOT 0.7 0.6  --  0.6  PROT 7.0 8.3*  --  8.1  ALBUMIN  3.5 4.1 3.9 4.3   Recent Labs    07/01/23 0510 07/08/23 0000 08/27/23 1313 09/09/23 0000 11/30/23 0955  WBC 7.1 7.2 9.0 7.5 7.6  NEUTROABS  --  3,262.00 4.9  --  4.1  HGB 9.6* 9.8* 12.3 10.7* 11.1*  HCT 28.4* 31* 36.9 34* 33.4*  MCV 91.0  --  90.0  --  91.8  PLT 233 253 240 222 174   Lab Results  Component Value Date   TSH 2.573 08/26/2020   Lab Results  Component Value Date   HGBA1C 8.0 09/13/2023   Lab Results  Component Value Date   CHOL 75 08/27/2022   HDL 22 (L) 08/27/2022   LDLCALC 34 08/27/2022   TRIG 146 05/08/2023   CHOLHDL 3.4 08/27/2022    Significant Diagnostic Results in last 30 days:  No results found.  Assessment/Plan Impaired mobility Impaired mobility requiring restorative therapy. Previous therapy halted due to foot wound and pain. Desire to resume therapy to prevent regression and manage pain. - Discuss with restorative therapy team to evaluate readiness to resume therapy - Consider re-evaluation for physical therapy if necessary  Chronic lower extremity pain Chronic pain in the lower extremities, including knee, hip, and ankle. Current treatment involves topical ointment on the left leg only due to dosage limitations. - Adjust ointment application to include both lower extremities, with reduced frequency to adhere to dosage limits  Painful toenail (possible onychocryptosis) Painful toenail, possibly due to onychocryptosis, causing discomfort when standing. The second  toe on the right foot is affected, with soreness extending to the big toe. - Recommend using corn cushions between toes to alleviate pressure  Chronic diarrhea Chronic diarrhea with intermittent episodes. Currently resolved but with a history of recurrence. No new medications identified as a cause. - Prescribe Imodium for use if diarrhea occurs three or more times a day  Pruritus of scalp Severe pruritus of the scalp causing significant discomfort. Potential benefit from antifungal treatment. - Prescribe ketoconazole shampoo to be used twice a week - Consider topical steroid if ketoconazole shampoo is ineffective Family/ staff Communication: nursing, message to daughter.   Labs/tests ordered:  none

## 2023-12-21 ENCOUNTER — Other Ambulatory Visit: Payer: Self-pay | Admitting: Nurse Practitioner

## 2023-12-26 ENCOUNTER — Encounter: Payer: Self-pay | Admitting: Student

## 2023-12-28 ENCOUNTER — Other Ambulatory Visit: Payer: Self-pay | Admitting: Nurse Practitioner

## 2023-12-28 MED ORDER — PREGABALIN 50 MG PO CAPS: 50.0000 mg | ORAL_CAPSULE | Freq: Every day | ORAL | 2 refills | Status: DC

## 2023-12-30 ENCOUNTER — Encounter: Payer: Self-pay | Admitting: Nurse Practitioner

## 2023-12-30 ENCOUNTER — Non-Acute Institutional Stay (SKILLED_NURSING_FACILITY): Payer: Self-pay | Admitting: Nurse Practitioner

## 2023-12-30 DIAGNOSIS — Z0000 Encounter for general adult medical examination without abnormal findings: Secondary | ICD-10-CM | POA: Diagnosis not present

## 2023-12-30 NOTE — Progress Notes (Signed)
 Subjective:   Tina Curtis is a 89 y.o. female who presents for Medicare Annual (Subsequent) preventive examination.  Visit Complete: In person [facility]    Cardiac Risk Factors include: advanced age (>55men, >64 women);dyslipidemia;hypertension;sedentary lifestyle;family history of premature cardiovascular disease     Objective:    Today's Vitals   12/30/23 0836 12/30/23 0849  BP: (!) 166/71 (!) 143/68  Pulse: 87   Resp: 16   Temp: 98 F (36.7 C)   SpO2: 95%   Weight: 161 lb 4.8 oz (73.2 kg)   Height: 5' 2 (1.575 m)    Body mass index is 29.5 kg/m.     11/30/2023    9:55 AM 11/02/2023    1:56 PM 09/30/2023    2:35 PM 09/23/2023   12:38 PM 08/27/2023    1:15 PM 07/12/2023    9:59 AM 06/30/2023   12:17 AM  Advanced Directives  Does Patient Have a Medical Advance Directive? Yes Yes Yes Yes Yes Yes No  Type of Advance Directive  Out of facility DNR (pink MOST or yellow form) Out of facility DNR (pink MOST or yellow form) Out of facility DNR (pink MOST or yellow form) Healthcare Power of [place];Living will Out of facility DNR (pink MOST or yellow form)   Does patient want to make changes to medical advance directive?  No - Patient declined No - Patient declined No - Patient declined  No - Patient declined   Would patient like information on creating a medical advance directive?      No - Patient declined No - Patient declined  Pre-existing out of facility DNR order (yellow form or pink MOST form)      Yellow form placed in chart (order not valid for inpatient use)     Current Medications (verified) Outpatient Encounter Medications as of 12/30/2023  Medication Sig   acetaminophen  (TYLENOL ) 500 MG tablet Take 1,000 mg by mouth 3 (three) times daily.   allopurinol  (ZYLOPRIM ) 100 MG tablet Take 150 mg by mouth daily.   Alpha-D-Galactosidase (GAS-X PREVENTION PO) Take 1 capsule by mouth every 8 (eight) hours as needed.   amLODipine  (NORVASC ) 10 MG tablet Take 10 mg by mouth at  bedtime.   anastrozole  (ARIMIDEX ) 1 MG tablet Take 1 tablet (1 mg total) by mouth daily.   bismuth subsalicylate (PEPTO BISMOL) 262 MG/15ML suspension Take 30 mLs by mouth every 4 (four) hours as needed.   calcium  carbonate (OSCAL) 1500 (600 Ca) MG TABS tablet Take 1 tablet by mouth 2 (two) times daily with a meal.   cholecalciferol  (VITAMIN D3) 25 MCG (1000 UNIT) tablet Take 1,000 Units by mouth daily.   cyanocobalamin  (VITAMIN B12) 1000 MCG tablet Take 1,000 mcg by mouth daily.   diclofenac  Sodium (VOLTAREN ) 1 % GEL Apply 2 g topically 2 (two) times daily. To bilateral legs.   fluticasone (FLONASE) 50 MCG/ACT nasal spray Place 1 spray into both nostrils daily.   furosemide  (LASIX ) 40 MG tablet Take 0.5 tablets (20 mg total) by mouth daily. Increase to 1 tablet (40 mg total) by mouth daily (total daily dose 40 mg) as needed for up to 3 days for increased leg swelling, shortness of breath, weight gain 5+ lbs over 1-2 days. Seek medical advice if these symptoms are not improving with increased dose. (Patient taking differently: Take 40 mg by mouth daily. Give 40mg  by mouth every 24 hours as needed for weight gain >3lb overnight or 5lb in one week)   guaifenesin  (ROBITUSSIN) 100  MG/5ML syrup Take 200 mg by mouth every 4 (four) hours as needed for cough.   hydrALAZINE  (APRESOLINE ) 50 MG tablet Take 1 tablet (50 mg total) by mouth 3 (three) times daily. Skip the dose if SBP less than 130 mmHg   ketoconazole (NIZORAL) 2 % shampoo Apply topically.   loperamide (IMODIUM) 2 MG capsule Take 2 mg by mouth every 8 (eight) hours as needed for diarrhea or loose stools.   loratadine  (CLARITIN ) 10 MG tablet Take 10 mg by mouth daily as needed for allergies. AM   LORazepam  (ATIVAN ) 0.5 MG tablet Take 0.5 tablets (0.25 mg total) by mouth daily.   LORazepam  (ATIVAN ) 0.5 MG tablet Give 0.25mg  by mouth 24 hours as needed   melatonin 3 MG TABS tablet Take 3 mg by mouth at bedtime.   metoprolol  succinate (TOPROL -XL) 25  MG 24 hr tablet Take 1 tablet (25 mg total) by mouth daily.   OXYGEN Inhale into the lungs. 2lpm   polyethylene glycol (MIRALAX  / GLYCOLAX ) 17 g packet Take 17 g by mouth daily. Skip the dose if no constipation   pregabalin  (LYRICA ) 50 MG capsule Take 1 capsule (50 mg total) by mouth at bedtime.   sertraline (ZOLOFT) 100 MG tablet Take 100 mg by mouth daily.   sodium bicarbonate  650 MG tablet Take 650 mg by mouth every 8 (eight) hours as needed for heartburn.   valsartan (DIOVAN) 160 MG tablet Take 160 mg by mouth daily.   Wheat Dextrin (BENEFIBER ON THE GO) PACK Take 1 packet by mouth daily as needed.   senna (SENOKOT) 8.6 MG TABS tablet Take 1 tablet by mouth every other day. (Patient not taking: Reported on 12/30/2023)   No facility-administered encounter medications on file as of 12/30/2023.    Allergies (verified) Penicillins and Empagliflozin    History: Past Medical History:  Diagnosis Date   Arthritis    Gout   Chronic kidney disease    Diarrhea    Hypercholesteremia    Hypertension    Neuropathy    feet and lower legs   Seasonal allergies    Skin cancer    face   Past Surgical History:  Procedure Laterality Date   ABDOMINAL HYSTERECTOMY  2000   APPENDECTOMY     BREAST BIOPSY Right 09/19/2019   affirm bx of calcs UOQ, x marker, path pending   BREAST BIOPSY Right 09/19/2019   us  bx of mass,heart marker, path pending   BREAST BIOPSY Right 09/19/2019   us  bx of LN, coil marker, path pending   CATARACT EXTRACTION Left    CATARACT EXTRACTION W/PHACO Right 10/24/2014   Procedure: CATARACT EXTRACTION PHACO AND INTRAOCULAR LENS PLACEMENT (IOC);  Surgeon: Dene Etienne, MD;  Location: [facility];  Service: Ophthalmology;  Laterality: Right;   ESOPHAGOGASTRODUODENOSCOPY N/A 03/07/2018   Procedure: ESOPHAGOGASTRODUODENOSCOPY (EGD);  Surgeon: Unk Corinn Skiff, MD;  Location: [facility];  Service: Gastroenterology;  Laterality: N/A;   MASTECTOMY MODIFIED  RADICAL Right 10/13/2019   Procedure: MASTECTOMY MODIFIED RADICAL;  Surgeon: Dessa Reyes ORN, MD;  Location: [facility] ORS;  Service: General;  Laterality: Right;   RENAL ANGIOGRAPHY Right 04/11/2018   Procedure: RENAL ANGIOGRAPHY;  Surgeon: Marea Selinda RAMAN, MD;  Location: [facility] INVASIVE CV LAB;  Service: Cardiovascular;  Laterality: Right;   SIMPLE MASTECTOMY WITH AXILLARY SENTINEL NODE BIOPSY Left 10/13/2019   Procedure: SIMPLE MASTECTOMY TRUE CUT BIOPSY, SENTINEL NODE BIOPSY;  Surgeon: Dessa Reyes ORN, MD;  Location: [facility] ORS;  Service: General;  Laterality: Left;   Family  History  Problem Relation Age of Onset   Hypertension Mother    Hypertension Father    Stroke Father    Breast cancer Daughter 4   Social History   Socioeconomic History   Marital status: Widowed    Spouse name: Not on file   Number of children: Not on file   Years of education: Not on file   Highest education level: Not on file  Occupational History   Not on file  Tobacco Use   Smoking status: Never   Smokeless tobacco: Never  Vaping Use   Vaping status: Never Used  Substance and Sexual Activity   Alcohol use: No   Drug use: Never   Sexual activity: Not Currently  Other Topics Concern   Not on file  Social History Narrative   2 daughters; lives in [address]; Runner, broadcasting/film/video retd. 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    Social Drivers of Corporate investment banker Strain: Low Risk  (11/17/2022)   Received [facility]   Overall Financial Resource Strain (CARDIA)    Difficulty of Paying Living Expenses: Not hard at all  Food Insecurity: No Food Insecurity (06/30/2023)   Hunger Vital Sign    Worried About Running Out of Food in the Last Year: Never true    Ran Out of Food in the Last Year: Never true  Transportation Needs: No Transportation Needs (06/30/2023)   PRAPARE - Administrator, Civil Service (Medical): No    Lack of Transportation (Non-Medical): No   Physical Activity: Not on file  Stress: Not on file  Social Connections: Unknown (06/30/2023)   Social Connection and Isolation Panel    Frequency of Communication with Friends and Family: Never    Frequency of Social Gatherings with Friends and Family: Never    Attends Religious Services: Never    Database administrator or Organizations: No    Attends Engineer, structural: Never    Marital Status: Patient unable to answer    Tobacco Counseling Counseling given: Not Answered   Clinical Intake:  Pre-visit preparation completed: Yes  Pain : No/denies pain     BMI - recorded: 29 Nutritional Status: BMI 25 -29 Overweight Nutritional Risks: Non-healing wound  How often do you need to have someone help you when you read instructions, pamphlets, or other written materials from your doctor or pharmacy?: 5 - Always         Activities of Daily Living    12/30/2023    8:48 AM 06/30/2023   12:18 AM  In your present state of health, do you have any difficulty performing the following activities:  Hearing? 1   Vision? 1   Difficulty concentrating or making decisions? 0   Walking or climbing stairs? 1   Dressing or bathing? 1   Doing errands, shopping? 1 0  Preparing Food and eating ? Y   Comment does not prepare food   Using the Toilet? Y   In the past six months, have you accidently leaked urine? Y   Do you have problems with loss of bowel control? N   Managing your Medications? Y   Managing your Finances? Y   Housekeeping or managing your Housekeeping? Y     Patient Care Team: Abdul Fine, MD as PCP - General (Family Medicine) Rennie Cindy SAUNDERS, MD as Consulting Physician (Oncology) Dessa, Reyes ORN, MD as Consulting Physician (General Surgery) Dannielle Arlean FALCON, RN (Inactive) as Oncology Nurse Navigator Chrystal,  Marcey, MD as Referring Physician (Radiation Oncology)  Indicate any recent Medical Services you may have received from other than [facility]  providers in the past year (date may be approximate).     Assessment:   This is a routine wellness examination for Aaliayah.  Hearing/Vision screen No results found.   Goals Addressed   None    Depression Screen    12/30/2023    8:52 AM 11/30/2023   10:18 AM  PHQ 2/9 Scores  PHQ - 2 Score 0 0    Fall Risk    12/30/2023    8:51 AM  Fall Risk   Falls in the past year? 1  Number falls in past yr: 1  Injury with Fall? 1  Risk for fall due to : History of fall(s);Impaired mobility;Impaired vision;Impaired balance/gait  Follow up Falls evaluation completed    MEDICARE RISK AT HOME: Medicare Risk at Home Any stairs in or around the home?: No Home free of loose throw rugs in walkways, pet beds, electrical cords, etc?: Yes Adequate lighting in your home to reduce risk of falls?: Yes Life alert?: No Use of a cane, walker or w/c?: Yes Grab bars in the bathroom?: Yes Shower chair or bench in shower?: Yes Elevated toilet seat or a handicapped toilet?: Yes  TIMED UP AND GO:  Was the test performed?  No    Cognitive Function:        Immunizations Immunization History  Administered Date(s) Administered   Influenza Inj Mdck Quad Pf 03/26/2016, 02/14/2018, 02/15/2019, 02/25/2021, 03/02/2022   Influenza Split 02/20/2015   Influenza, Seasonal, Injecte, Preservative Fre 04/13/2012, 03/17/2013   Influenza,inj,Quad PF,6+ Mos 03/07/2014, 02/20/2020   Influenza-Unspecified 03/26/2016, 02/04/2017, 02/14/2018, 02/25/2021, 04/12/2023   Moderna Sars-Covid-2 Vaccination 05/24/2019, 06/24/2019, 02/22/2020, 02/26/2022   PNEUMOCOCCAL CONJUGATE-20 04/09/2023   Pneumococcal Conjugate-13 09/01/2013   Pneumococcal Polysaccharide-23 04/13/2012   RSV,unspecified 04/09/2023   Td 08/07/2010   Tdap 08/07/2010   Zoster, Live 09/12/2007    TDAP status: Up to date  Flu Vaccine status: Due, Education has been provided regarding the importance of this vaccine. Advised may receive this vaccine at  local pharmacy or Health Dept. Aware to provide a copy of the vaccination record if obtained from local pharmacy or Health Dept. Verbalized acceptance and understanding.  Pneumococcal vaccine status: Up to date  Covid-19 vaccine status: Information provided on how to obtain vaccines.   Qualifies for Shingles Vaccine? Yes   Zostavax completed No   Shingrix Completed?: No.    Education has been provided regarding the importance of this vaccine. Patient has been advised to call insurance company to determine out of pocket expense if they have not yet received this vaccine. Advised may also receive vaccine at local pharmacy or Health Dept. Verbalized acceptance and understanding.  Screening Tests Health Maintenance  Topic Date Due   FOOT EXAM  Never done   Zoster Vaccines- Shingrix (1 of 2) 05/05/1946   DTaP/Tdap/Td (3 - Td or Tdap) 08/06/2020   COVID-19 Vaccine (5 - 2024-25 season) 01/10/2023   INFLUENZA VACCINE  12/10/2023   HEMOGLOBIN A1C  03/15/2024   OPHTHALMOLOGY EXAM  12/02/2024   Medicare Annual Wellness (AWV)  12/29/2024   Pneumococcal Vaccine: 50+ Years  Completed   DEXA SCAN  Completed   HPV VACCINES  Aged Out   Meningococcal B Vaccine  Aged Out    Health Maintenance  Health Maintenance Due  Topic Date Due   FOOT EXAM  Never done   Zoster Vaccines- Shingrix (1 of  2) 05/05/1946   DTaP/Tdap/Td (3 - Td or Tdap) 08/06/2020   COVID-19 Vaccine (5 - 2024-25 season) 01/10/2023   INFLUENZA VACCINE  12/10/2023    Colorectal cancer screening: No longer required.   Mammogram status: No longer required due to age.   Lung Cancer Screening: (Low Dose CT Chest recommended if Age 7-80 years, 20 pack-year currently smoking OR have quit w/in 15years.) does not qualify.   Lung Cancer Screening Referral: na  Additional Screening:  Hepatitis C Screening: does not qualify  Vision Screening: Recommended annual ophthalmology exams for early detection of glaucoma and other disorders  of the eye. Is the patient up to date with their annual eye exam?  Yes  Who is the provider or what is the name of the office in which the patient attends annual eye exams? [facility] If pt is not established with a provider, would they like to be referred to a provider to establish care? No .   Dental Screening: Recommended annual dental exams for proper oral hygiene  Diabetic Foot Exam: Diabetic Foot Exam: Completed today  Community Resource Referral / Chronic Care Management: CRR required this visit?  No   CCM required this visit?  No     Plan:     I have personally reviewed and noted the following in the patient's chart:   Medical and social history Use of alcohol, tobacco or illicit drugs  Current medications and supplements including opioid prescriptions. Patient is not currently taking opioid prescriptions. Functional ability and status Nutritional status Physical activity Advanced directives List of other physicians Hospitalizations, surgeries, and ER visits in previous 12 months Vitals Screenings to include cognitive, depression, and falls Referrals and appointments  In addition, I have reviewed and discussed with patient certain preventive protocols, quality metrics, and best practice recommendations. A written personalized care plan for preventive services as well as general preventive health recommendations were provided to patient.     Harlene MARLA An, NP   12/30/2023

## 2023-12-30 NOTE — Patient Instructions (Signed)
  Ms. Krabbenhoft , Thank you for taking time to come for your Medicare Wellness Visit. I appreciate your ongoing commitment to your health goals. Please review the following plan we discussed and let me know if I can assist you in the future.   Will get TDAP and shingles vaccine to be given in facility at [facility]    This is a list of the screening recommended for you and due dates:  Health Maintenance  Topic Date Due   Complete foot exam   Never done   Zoster (Shingles) Vaccine (1 of 2) 05/05/1946   DTaP/Tdap/Td vaccine (3 - Td or Tdap) 08/06/2020   COVID-19 Vaccine (5 - 2024-25 season) 01/10/2023   Flu Shot  12/10/2023   Hemoglobin A1C  03/15/2024   Eye exam for diabetics  12/02/2024   Medicare Annual Wellness Visit  12/29/2024   Pneumococcal Vaccine for age over 61  Completed   DEXA scan (bone density measurement)  Completed   HPV Vaccine  Aged Out   Meningitis B Vaccine  Aged Out

## 2024-01-03 ENCOUNTER — Ambulatory Visit: Admitting: Podiatry

## 2024-01-04 ENCOUNTER — Encounter: Payer: Self-pay | Admitting: Nurse Practitioner

## 2024-01-04 ENCOUNTER — Non-Acute Institutional Stay (SKILLED_NURSING_FACILITY): Payer: Self-pay | Admitting: Nurse Practitioner

## 2024-01-04 DIAGNOSIS — F419 Anxiety disorder, unspecified: Secondary | ICD-10-CM

## 2024-01-04 DIAGNOSIS — L821 Other seborrheic keratosis: Secondary | ICD-10-CM

## 2024-01-04 DIAGNOSIS — E1122 Type 2 diabetes mellitus with diabetic chronic kidney disease: Secondary | ICD-10-CM | POA: Diagnosis not present

## 2024-01-04 DIAGNOSIS — I5032 Chronic diastolic (congestive) heart failure: Secondary | ICD-10-CM | POA: Diagnosis not present

## 2024-01-04 DIAGNOSIS — N1832 Chronic kidney disease, stage 3b: Secondary | ICD-10-CM

## 2024-01-04 DIAGNOSIS — K529 Noninfective gastroenteritis and colitis, unspecified: Secondary | ICD-10-CM | POA: Diagnosis not present

## 2024-01-04 DIAGNOSIS — F331 Major depressive disorder, recurrent, moderate: Secondary | ICD-10-CM

## 2024-01-04 DIAGNOSIS — N184 Chronic kidney disease, stage 4 (severe): Secondary | ICD-10-CM | POA: Diagnosis not present

## 2024-01-04 NOTE — Progress Notes (Signed)
 Location:  Other [place].  Nursing Home Room Number: [facility] [id number] Place of Service:  SNF [phone]) Tina An, NP  PCP: Abdul Fine, MD  Patient Care Team: Abdul Fine, MD as PCP - General (Family Medicine) Rennie Cindy SAUNDERS, MD as Consulting Physician (Oncology) Dessa, Reyes ORN, MD as Consulting Physician (General Surgery) Dannielle Arlean FALCON, RN (Inactive) as Oncology Nurse Navigator Lenn Aran, MD as Referring Physician (Radiation Oncology)  Extended Emergency Contact Information Primary Emergency Contact: [facility] Phone: [phone] Relation: Daughter Secondary Emergency Contact: Curtis,Tina Mobile Phone: [phone] Relation: Niece  Goals of care: Advanced Directive information    01/04/2024   11:35 AM  Advanced Directives  Does Patient Have a Medical Advance Directive? Yes  Type of Advance Directive Out of facility DNR (pink MOST or yellow form)  Does patient want to make changes to medical advance directive? No - Patient declined     Chief Complaint  Patient presents with   Medical Management of Chronic Issues    Medical Management of Chronic Issues.     HPI:   The patient is a 89 year old female who was seen today for chronic disease management.   She is currently sitting in a wheelchair and expresses a desire to walk. She reports experiencing diarrhea approximately twice daily.   Recently, she was assessed by podiatry for toenail care on her left foot, while the right foot requires attention for callus removal. She would like to follow up with external podiatry. She mentions her shoes are less than one year old.   The patient is generally able to get to the bathroom for urination. She reports she is sleeping through the night. She maintains a nutritious diet while trying to lose weight, occasionally indulging in desserts and limiting her bread intake to sandwiches.  Skin issues include redness in the groin area,  which improves with ointment application. She also has dry, flaky patches on her arms, forehead, and thigh, diagnosed as age spots by her dermatologist, and she has noted some itching on her scalp.   The CNAs assist her in walking to the bathroom and dining room, but she wishes to walk longer distances and has mentioned that her walker is loose.   Past Medical History:  Diagnosis Date   Arthritis    Gout   Chronic kidney disease    Diarrhea    Hypercholesteremia    Hypertension    Neuropathy    feet and lower legs   Seasonal allergies    Skin cancer    face   Past Surgical History:  Procedure Laterality Date   ABDOMINAL HYSTERECTOMY  2000   APPENDECTOMY     BREAST BIOPSY Right 09/19/2019   affirm bx of calcs UOQ, x marker, path pending   BREAST BIOPSY Right 09/19/2019   us  bx of mass,Curtis marker, path pending   BREAST BIOPSY Right 09/19/2019   us  bx of LN, coil marker, path pending   CATARACT EXTRACTION Left    CATARACT EXTRACTION W/PHACO Right 10/24/2014   Procedure: CATARACT EXTRACTION PHACO AND INTRAOCULAR LENS PLACEMENT (IOC);  Surgeon: Dene Etienne, MD;  Location: [facility];  Service: Ophthalmology;  Laterality: Right;   ESOPHAGOGASTRODUODENOSCOPY N/A 03/07/2018   Procedure: ESOPHAGOGASTRODUODENOSCOPY (EGD);  Surgeon: Unk Corinn Skiff, MD;  Location: [facility] ENDOSCOPY;  Service: Gastroenterology;  Laterality: N/A;   MASTECTOMY MODIFIED RADICAL Right 10/13/2019   Procedure: MASTECTOMY MODIFIED RADICAL;  Surgeon: Dessa Reyes ORN, MD;  Location: [facility] ORS;  Service: General;  Laterality: Right;  RENAL ANGIOGRAPHY Right 04/11/2018   Procedure: RENAL ANGIOGRAPHY;  Surgeon: Marea Selinda RAMAN, MD;  Location: [facility] INVASIVE CV LAB;  Service: Cardiovascular;  Laterality: Right;   SIMPLE MASTECTOMY WITH AXILLARY SENTINEL NODE BIOPSY Left 10/13/2019   Procedure: SIMPLE MASTECTOMY TRUE CUT BIOPSY, SENTINEL NODE BIOPSY;  Surgeon: Dessa Reyes ORN, MD;  Location: [facility] ORS;   Service: General;  Laterality: Left;    Allergies  Allergen Reactions   Penicillins Anaphylaxis    Patient tolerates ceftriaxone    Empagliflozin  Rash    Outpatient Encounter Medications as of 01/04/2024  Medication Sig   acetaminophen  (TYLENOL ) 500 MG tablet Take 1,000 mg by mouth 3 (three) times daily.   allopurinol  (ZYLOPRIM ) 100 MG tablet Take 150 mg by mouth daily.   Alpha-D-Galactosidase (GAS-X PREVENTION PO) Take 1 capsule by mouth every 8 (eight) hours as needed.   amLODipine  (NORVASC ) 10 MG tablet Take 10 mg by mouth at bedtime.   anastrozole  (ARIMIDEX ) 1 MG tablet Take 1 tablet (1 mg total) by mouth daily.   bismuth subsalicylate (PEPTO BISMOL) 262 MG/15ML suspension Take 30 mLs by mouth every 4 (four) hours as needed.   calcium  carbonate (OSCAL) 1500 (600 Ca) MG TABS tablet Take 1 tablet by mouth 2 (two) times daily with a meal.   cholecalciferol  (VITAMIN D3) 25 MCG (1000 UNIT) tablet Take 1,000 Units by mouth daily.   cyanocobalamin  (VITAMIN B12) 1000 MCG tablet Take 1,000 mcg by mouth daily.   diclofenac  Sodium (VOLTAREN ) 1 % GEL Apply 2 g topically 2 (two) times daily. To bilateral legs.   fluticasone (FLONASE) 50 MCG/ACT nasal spray Place 1 spray into both nostrils daily.   furosemide  (LASIX ) 40 MG tablet Take 0.5 tablets (20 mg total) by mouth daily. Increase to 1 tablet (40 mg total) by mouth daily (total daily dose 40 mg) as needed for up to 3 days for increased leg swelling, shortness of breath, weight gain 5+ lbs over 1-2 days. Seek medical advice if these symptoms are not improving with increased dose.   guaifenesin  (ROBITUSSIN) 100 MG/5ML syrup Take 200 mg by mouth every 4 (four) hours as needed for cough.   hydrALAZINE  (APRESOLINE ) 50 MG tablet Take 1 tablet (50 mg total) by mouth 3 (three) times daily. Skip the dose if SBP less than 130 mmHg   ketoconazole (NIZORAL) 2 % shampoo Apply topically.   loperamide (IMODIUM) 2 MG capsule Take 2 mg by mouth every 8 (eight)  hours as needed for diarrhea or loose stools.   loratadine  (CLARITIN ) 10 MG tablet Take 10 mg by mouth daily as needed for allergies. AM   LORazepam  (ATIVAN ) 0.5 MG tablet Take 0.5 tablets (0.25 mg total) by mouth daily.   LORazepam  (ATIVAN ) 0.5 MG tablet Give 0.25mg  by mouth 24 hours as needed   melatonin 3 MG TABS tablet Take 3 mg by mouth at bedtime.   metoprolol  succinate (TOPROL -XL) 25 MG 24 hr tablet Take 1 tablet (25 mg total) by mouth daily.   OXYGEN Inhale into the lungs. 2lpm   polyethylene glycol (MIRALAX  / GLYCOLAX ) 17 g packet Take 17 g by mouth daily. Skip the dose if no constipation   pregabalin  (LYRICA ) 50 MG capsule Take 1 capsule (50 mg total) by mouth at bedtime.   sertraline (ZOLOFT) 100 MG tablet Take 100 mg by mouth daily.   sodium bicarbonate  650 MG tablet Take 650 mg by mouth every 8 (eight) hours as needed for heartburn.   VAGINAL LUBRICANT VA Place 1 Application vaginally every 12 (  twelve) hours as needed.   valsartan (DIOVAN) 160 MG tablet Take 160 mg by mouth daily.   Wheat Dextrin (BENEFIBER ON THE GO) PACK Take 1 packet by mouth daily as needed.   senna (SENOKOT) 8.6 MG TABS tablet Take 1 tablet by mouth every other day. (Patient not taking: Reported on 01/04/2024)   No facility-administered encounter medications on file as of 01/04/2024.    Review of Systems  Constitutional: Negative.   HENT: Negative.    Respiratory: Negative.    Cardiovascular: Negative.   Gastrointestinal:  Positive for diarrhea.  Endocrine: Negative.   Genitourinary: Negative.   Musculoskeletal: Negative.   Skin: Negative.   Neurological: Negative.   Psychiatric/Behavioral: Negative.       Immunization History  Administered Date(s) Administered   Influenza Inj Mdck Quad Pf 03/26/2016, 02/14/2018, 02/15/2019, 02/25/2021, 03/02/2022   Influenza Split 02/20/2015   Influenza, Seasonal, Injecte, Preservative Fre 04/13/2012, 03/17/2013   Influenza,inj,Quad PF,6+ Mos 03/07/2014,  02/20/2020   Influenza-Unspecified 03/26/2016, 02/04/2017, 02/14/2018, 02/25/2021, 04/12/2023   Moderna Sars-Covid-2 Vaccination 05/24/2019, 06/24/2019, 02/22/2020, 02/26/2022   PNEUMOCOCCAL CONJUGATE-20 04/09/2023   Pneumococcal Conjugate-13 09/01/2013   Pneumococcal Polysaccharide-23 04/13/2012   RSV,unspecified 04/09/2023   Td 08/07/2010   Tdap 08/07/2010, 12/31/2023   Zoster, Live 09/12/2007   Pertinent  Health Maintenance Due  Topic Date Due   FOOT EXAM  Never done   INFLUENZA VACCINE  12/10/2023   HEMOGLOBIN A1C  03/15/2024   OPHTHALMOLOGY EXAM  12/02/2024   DEXA SCAN  Completed      02/18/2022    1:36 PM 03/18/2022    4:19 PM 03/18/2022    4:46 PM 05/13/2022   10:17 AM 12/30/2023    8:51 AM  Fall Risk  Falls in the past year?     1  Was there Curtis injury with Fall?     1  Fall Risk Category Calculator     3  (RETIRED) Patient Fall Risk Level High fall risk  Moderate fall risk  Moderate fall risk  High fall risk    Patient at Risk for Falls Due to     History of fall(s);Impaired mobility;Impaired vision;Impaired balance/gait  Fall risk Follow up     Falls evaluation completed     Data saved with a previous flowsheet row definition   Functional Status Survey:    Vitals:   01/04/24 1115 01/04/24 1136  BP: (!) 152/68 (!) 144/72  Pulse: 78   Resp: 18   Temp: (!) 97.1 F (36.2 C)   SpO2: 95%   Weight: 161 lb 12.8 oz (73.4 kg)   Height: 5' 2 (1.575 m)    Body mass index is 29.59 kg/m. Physical Exam Vitals reviewed.  Constitutional:      Appearance: She is normal weight.  HENT:     Head: Normocephalic and atraumatic.     Right Ear: External ear normal.     Left Ear: External ear normal.     Nose: Nose normal.     Mouth/Throat:     Mouth: Mucous membranes are moist.     Pharynx: Oropharynx is clear.  Eyes:     Conjunctiva/sclera: Conjunctivae normal.  Cardiovascular:     Rate and Rhythm: Normal rate and regular rhythm.     Pulses: Normal pulses.     Curtis  sounds: Normal Curtis sounds.  Pulmonary:     Effort: Pulmonary effort is normal.     Breath sounds: Normal breath sounds.  Abdominal:     General: Bowel sounds  are normal.     Palpations: Abdomen is soft.  Musculoskeletal:        General: Normal range of motion.  Skin:    General: Skin is warm and dry.  Neurological:     General: No focal deficit present.     Mental Status: She is alert and oriented to person, place, and time. Mental status is at baseline.  Psychiatric:        Mood and Affect: Mood normal.        Behavior: Behavior normal.     Labs reviewed: Recent Labs    04/01/23 0519 04/12/23 0000 05/08/23 0402 05/09/23 0436 05/10/23 0210 05/11/23 0515 07/03/23 0454 07/05/23 0000 08/27/23 1313 09/06/23 0000 10/18/23 0000 11/18/23 0000 11/30/23 0955  NA 138   < > 136 135 133*   < > 140   < > 138   < > 140 142 142  K 4.4   < > 3.7 3.8 4.0   < > 3.7   < > 3.9   < > 4.0 3.9 3.9  CL 107   < > 108 106 105   < > 105   < > 101   < > 105 107 113*  CO2 21*   < > 19* 18* 19*   < > 25   < > 26   < > 25* 25* 20*  GLUCOSE 156*   < > 136* 119* 137*   < > 113*  --  152*  --   --   --  186*  BUN 39*   < > 31* 34* 39*   < > 28*   < > 42*   < > 19 63* 46*  CREATININE 1.58*   < > 1.31* 1.47* 1.56*   < > 1.56*   < > 1.86*   < > 1.9* 2.4* 2.13*  CALCIUM  8.5*   < > 8.9 8.8* 8.6*   < > 9.5   < > 12.3*   < > 9.6 10.4 10.0  MG 2.4  --  1.7 2.0  --   --   --   --   --   --   --   --   --   PHOS 3.0  --  3.3 3.4 2.9  --   --   --   --   --   --   --   --    < > = values in this interval not displayed.   Recent Labs    06/29/23 0823 08/27/23 1313 09/09/23 0000 11/30/23 0955  AST 21 25 18 25   ALT 11 19 10 15   ALKPHOS 61 91 72 79  BILITOT 0.7 0.6  --  0.6  PROT 7.0 8.3*  --  8.1  ALBUMIN  3.5 4.1 3.9 4.3   Recent Labs    07/01/23 0510 07/08/23 0000 08/27/23 1313 09/09/23 0000 11/30/23 0955  WBC 7.1 7.2 9.0 7.5 7.6  NEUTROABS  --  3,262.00 4.9  --  4.1  HGB 9.6* 9.8* 12.3  10.7* 11.1*  HCT 28.4* 31* 36.9 34* 33.4*  MCV 91.0  --  90.0  --  91.8  PLT 233 253 240 222 174   Lab Results  Component Value Date   TSH 2.573 08/26/2020   Lab Results  Component Value Date   HGBA1C 8.0 09/13/2023   Lab Results  Component Value Date   CHOL 75 08/27/2022   HDL 22 (L) 08/27/2022   LDLCALC 34 08/27/2022  TRIG 146 05/08/2023   CHOLHDL 3.4 08/27/2022    Significant Diagnostic Results in last 30 days:  No results found.  Assessment/Plan  1. Chronic diarrhea (Primary) - Change Benefiber to daily dosing instead of PRN. - Monitor electrolytes routinely.  2. Chronic kidney disease, stage 4 (severe) (HCC) - Creatinine level is 2.13, GFR is 21 as of July 2025. - Continue monitoring and follow up with nephrology.  3. Type 2 diabetes mellitus with stage 3b chronic kidney disease, without long-term current use of insulin  (HCC) - Glucose level recorded at 186 on 11/30/23. - Encourage reduction of sweets. A1c 8 in May Monitor due to age  57. Chronic diastolic CHF (congestive Curtis failure) (HCC) - Stable condition; continue Valsartan, Metoprolol , and Lasix  as prescribed. Euvolemic no signs of fluid retention  5. Moderate episode of recurrent major depressive disorder (HCC) - Stable; continue Zoloft as prescribed  6. Anxiety - Stable; continue Zoloft and Ativan  as prescribed  7. Seborrheic keratosis.  - noted today, monitor.     Waylan Rabon, RN AGPCNP Student I personally was present during the history, physical exam and medical decision-making activities of this service and have verified that the service and findings are accurately documented in the student's note Ernesteen Mihalic K. Caro BODILY [facility] [phone]

## 2024-01-04 NOTE — Progress Notes (Deleted)
 Location:  Other [place].  Nursing Home Room Number: [facility] [id number] Place of Service:  SNF [phone]) Harlene An, NP  PCP: Abdul Fine, MD  Patient Care Team: Abdul Fine, MD as PCP - General (Family Medicine) Rennie Cindy SAUNDERS, MD as Consulting Physician (Oncology) Dessa, Reyes ORN, MD as Consulting Physician (General Surgery) Dannielle Arlean FALCON, RN (Inactive) as Oncology Nurse Navigator Lenn Aran, MD as Referring Physician (Radiation Oncology)  Extended Emergency Contact Information Primary Emergency Contact: [facility] Phone: [phone] Relation: Daughter Secondary Emergency Contact: Hicklin,Roxanne Mobile Phone: [phone] Relation: Niece  Goals of care: Advanced Directive information    01/04/2024   11:35 AM  Advanced Directives  Does Patient Have a Medical Advance Directive? Yes  Type of Advance Directive Out of facility DNR (pink MOST or yellow form)  Does patient want to make changes to medical advance directive? No - Patient declined     Chief Complaint  Patient presents with   Medical Management of Chronic Issues    Medical Management of Chronic Issues.     HPI:  Pt is a 89 y.o. female seen today for medical management of chronic disease.    Past Medical History:  Diagnosis Date   Arthritis    Gout   Chronic kidney disease    Diarrhea    Hypercholesteremia    Hypertension    Neuropathy    feet and lower legs   Seasonal allergies    Skin cancer    face   Past Surgical History:  Procedure Laterality Date   ABDOMINAL HYSTERECTOMY  2000   APPENDECTOMY     BREAST BIOPSY Right 09/19/2019   affirm bx of calcs UOQ, x marker, path pending   BREAST BIOPSY Right 09/19/2019   us  bx of mass,heart marker, path pending   BREAST BIOPSY Right 09/19/2019   us  bx of LN, coil marker, path pending   CATARACT EXTRACTION Left    CATARACT EXTRACTION W/PHACO Right 10/24/2014   Procedure: CATARACT EXTRACTION PHACO AND  INTRAOCULAR LENS PLACEMENT (IOC);  Surgeon: Dene Etienne, MD;  Location: [facility];  Service: Ophthalmology;  Laterality: Right;   ESOPHAGOGASTRODUODENOSCOPY N/A 03/07/2018   Procedure: ESOPHAGOGASTRODUODENOSCOPY (EGD);  Surgeon: Unk Corinn Skiff, MD;  Location: [facility] ENDOSCOPY;  Service: Gastroenterology;  Laterality: N/A;   MASTECTOMY MODIFIED RADICAL Right 10/13/2019   Procedure: MASTECTOMY MODIFIED RADICAL;  Surgeon: Dessa Reyes ORN, MD;  Location: [facility] ORS;  Service: General;  Laterality: Right;   RENAL ANGIOGRAPHY Right 04/11/2018   Procedure: RENAL ANGIOGRAPHY;  Surgeon: Marea Selinda RAMAN, MD;  Location: [facility] INVASIVE CV LAB;  Service: Cardiovascular;  Laterality: Right;   SIMPLE MASTECTOMY WITH AXILLARY SENTINEL NODE BIOPSY Left 10/13/2019   Procedure: SIMPLE MASTECTOMY TRUE CUT BIOPSY, SENTINEL NODE BIOPSY;  Surgeon: Dessa Reyes ORN, MD;  Location: [facility] ORS;  Service: General;  Laterality: Left;    Allergies  Allergen Reactions   Penicillins Anaphylaxis    Patient tolerates ceftriaxone    Empagliflozin  Rash    Outpatient Encounter Medications as of 01/04/2024  Medication Sig   acetaminophen  (TYLENOL ) 500 MG tablet Take 1,000 mg by mouth 3 (three) times daily.   allopurinol  (ZYLOPRIM ) 100 MG tablet Take 150 mg by mouth daily.   Alpha-D-Galactosidase (GAS-X PREVENTION PO) Take 1 capsule by mouth every 8 (eight) hours as needed.   amLODipine  (NORVASC ) 10 MG tablet Take 10 mg by mouth at bedtime.   anastrozole  (ARIMIDEX ) 1 MG tablet Take 1 tablet (1 mg total) by mouth daily.   bismuth subsalicylate (PEPTO  BISMOL) 262 MG/15ML suspension Take 30 mLs by mouth every 4 (four) hours as needed.   calcium  carbonate (OSCAL) 1500 (600 Ca) MG TABS tablet Take 1 tablet by mouth 2 (two) times daily with a meal.   cholecalciferol  (VITAMIN D3) 25 MCG (1000 UNIT) tablet Take 1,000 Units by mouth daily.   cyanocobalamin  (VITAMIN B12) 1000 MCG tablet Take 1,000 mcg by mouth daily.    diclofenac  Sodium (VOLTAREN ) 1 % GEL Apply 2 g topically 2 (two) times daily. To bilateral legs.   fluticasone (FLONASE) 50 MCG/ACT nasal spray Place 1 spray into both nostrils daily.   furosemide  (LASIX ) 40 MG tablet Take 0.5 tablets (20 mg total) by mouth daily. Increase to 1 tablet (40 mg total) by mouth daily (total daily dose 40 mg) as needed for up to 3 days for increased leg swelling, shortness of breath, weight gain 5+ lbs over 1-2 days. Seek medical advice if these symptoms are not improving with increased dose.   guaifenesin  (ROBITUSSIN) 100 MG/5ML syrup Take 200 mg by mouth every 4 (four) hours as needed for cough.   hydrALAZINE  (APRESOLINE ) 50 MG tablet Take 1 tablet (50 mg total) by mouth 3 (three) times daily. Skip the dose if SBP less than 130 mmHg   ketoconazole (NIZORAL) 2 % shampoo Apply topically.   loperamide (IMODIUM) 2 MG capsule Take 2 mg by mouth every 8 (eight) hours as needed for diarrhea or loose stools.   loratadine  (CLARITIN ) 10 MG tablet Take 10 mg by mouth daily as needed for allergies. AM   LORazepam  (ATIVAN ) 0.5 MG tablet Take 0.5 tablets (0.25 mg total) by mouth daily.   LORazepam  (ATIVAN ) 0.5 MG tablet Give 0.25mg  by mouth 24 hours as needed   melatonin 3 MG TABS tablet Take 3 mg by mouth at bedtime.   metoprolol  succinate (TOPROL -XL) 25 MG 24 hr tablet Take 1 tablet (25 mg total) by mouth daily.   OXYGEN Inhale into the lungs. 2lpm   polyethylene glycol (MIRALAX  / GLYCOLAX ) 17 g packet Take 17 g by mouth daily. Skip the dose if no constipation   pregabalin  (LYRICA ) 50 MG capsule Take 1 capsule (50 mg total) by mouth at bedtime.   sertraline (ZOLOFT) 100 MG tablet Take 100 mg by mouth daily.   sodium bicarbonate  650 MG tablet Take 650 mg by mouth every 8 (eight) hours as needed for heartburn.   VAGINAL LUBRICANT VA Place 1 Application vaginally every 12 (twelve) hours as needed.   valsartan (DIOVAN) 160 MG tablet Take 160 mg by mouth daily.   Wheat Dextrin  (BENEFIBER ON THE GO) PACK Take 1 packet by mouth daily as needed.   senna (SENOKOT) 8.6 MG TABS tablet Take 1 tablet by mouth every other day. (Patient not taking: Reported on 01/04/2024)   No facility-administered encounter medications on file as of 01/04/2024.    Review of Systems ***  Immunization History  Administered Date(s) Administered   Influenza Inj Mdck Quad Pf 03/26/2016, 02/14/2018, 02/15/2019, 02/25/2021, 03/02/2022   Influenza Split 02/20/2015   Influenza, Seasonal, Injecte, Preservative Fre 04/13/2012, 03/17/2013   Influenza,inj,Quad PF,6+ Mos 03/07/2014, 02/20/2020   Influenza-Unspecified 03/26/2016, 02/04/2017, 02/14/2018, 02/25/2021, 04/12/2023   Moderna Sars-Covid-2 Vaccination 05/24/2019, 06/24/2019, 02/22/2020, 02/26/2022   PNEUMOCOCCAL CONJUGATE-20 04/09/2023   Pneumococcal Conjugate-13 09/01/2013   Pneumococcal Polysaccharide-23 04/13/2012   RSV,unspecified 04/09/2023   Td 08/07/2010   Tdap 08/07/2010, 12/31/2023   Zoster, Live 09/12/2007   Pertinent  Health Maintenance Due  Topic Date Due   FOOT EXAM  Never done   INFLUENZA VACCINE  12/10/2023   HEMOGLOBIN A1C  03/15/2024   OPHTHALMOLOGY EXAM  12/02/2024   DEXA SCAN  Completed      02/18/2022    1:36 PM 03/18/2022    4:19 PM 03/18/2022    4:46 PM 05/13/2022   10:17 AM 12/30/2023    8:51 AM  Fall Risk  Falls in the past year?     1  Was there an injury with Fall?     1  Fall Risk Category Calculator     3  (RETIRED) Patient Fall Risk Level High fall risk  Moderate fall risk  Moderate fall risk  High fall risk    Patient at Risk for Falls Due to     History of fall(s);Impaired mobility;Impaired vision;Impaired balance/gait  Fall risk Follow up     Falls evaluation completed     Data saved with a previous flowsheet row definition   Functional Status Survey:    Vitals:   01/04/24 1115 01/04/24 1136  BP: (!) 152/68 (!) 144/72  Pulse: 78   Resp: 18   Temp: (!) 97.1 F (36.2 C)   SpO2: 95%    Weight: 161 lb 12.8 oz (73.4 kg)   Height: 5' 2 (1.575 m)    Body mass index is 29.59 kg/m. Physical Exam***  Labs reviewed: Recent Labs    04/01/23 0519 04/12/23 0000 05/08/23 0402 05/09/23 0436 05/10/23 0210 05/11/23 0515 07/03/23 0454 07/05/23 0000 08/27/23 1313 09/06/23 0000 10/18/23 0000 11/18/23 0000 11/30/23 0955  NA 138   < > 136 135 133*   < > 140   < > 138   < > 140 142 142  K 4.4   < > 3.7 3.8 4.0   < > 3.7   < > 3.9   < > 4.0 3.9 3.9  CL 107   < > 108 106 105   < > 105   < > 101   < > 105 107 113*  CO2 21*   < > 19* 18* 19*   < > 25   < > 26   < > 25* 25* 20*  GLUCOSE 156*   < > 136* 119* 137*   < > 113*  --  152*  --   --   --  186*  BUN 39*   < > 31* 34* 39*   < > 28*   < > 42*   < > 19 63* 46*  CREATININE 1.58*   < > 1.31* 1.47* 1.56*   < > 1.56*   < > 1.86*   < > 1.9* 2.4* 2.13*  CALCIUM  8.5*   < > 8.9 8.8* 8.6*   < > 9.5   < > 12.3*   < > 9.6 10.4 10.0  MG 2.4  --  1.7 2.0  --   --   --   --   --   --   --   --   --   PHOS 3.0  --  3.3 3.4 2.9  --   --   --   --   --   --   --   --    < > = values in this interval not displayed.   Recent Labs    06/29/23 0823 08/27/23 1313 09/09/23 0000 11/30/23 0955  AST 21 25 18 25   ALT 11 19 10 15   ALKPHOS 61 91 72 79  BILITOT 0.7 0.6  --  0.6  PROT 7.0 8.3*  --  8.1  ALBUMIN  3.5 4.1 3.9 4.3   Recent Labs    07/01/23 0510 07/08/23 0000 08/27/23 1313 09/09/23 0000 11/30/23 0955  WBC 7.1 7.2 9.0 7.5 7.6  NEUTROABS  --  3,262.00 4.9  --  4.1  HGB 9.6* 9.8* 12.3 10.7* 11.1*  HCT 28.4* 31* 36.9 34* 33.4*  MCV 91.0  --  90.0  --  91.8  PLT 233 253 240 222 174   Lab Results  Component Value Date   TSH 2.573 08/26/2020   Lab Results  Component Value Date   HGBA1C 8.0 09/13/2023   Lab Results  Component Value Date   CHOL 75 08/27/2022   HDL 22 (L) 08/27/2022   LDLCALC 34 08/27/2022   TRIG 146 05/08/2023   CHOLHDL 3.4 08/27/2022    Significant Diagnostic Results in last 30 days:  No results  found.  Assessment/Plan No problem-specific Assessment & Plan notes found for this encounter.     Tina Curtis K. Caro BODILY [facility] [phone]

## 2024-01-06 LAB — BASIC METABOLIC PANEL WITH GFR
BUN: 38 — AB (ref 4–21)
CO2: 28 — AB (ref 13–22)
Chloride: 105 (ref 99–108)
Creatinine: 1.9 — AB (ref 0.5–1.1)
Glucose: 115
Potassium: 4 meq/L (ref 3.5–5.1)
Sodium: 140 (ref 137–147)

## 2024-01-06 LAB — COMPREHENSIVE METABOLIC PANEL WITH GFR
Calcium: 10.9 — AB (ref 8.7–10.7)
eGFR: 25

## 2024-01-11 ENCOUNTER — Encounter: Payer: Self-pay | Admitting: Nurse Practitioner

## 2024-01-11 ENCOUNTER — Non-Acute Institutional Stay (SKILLED_NURSING_FACILITY): Payer: Self-pay | Admitting: Nurse Practitioner

## 2024-01-11 DIAGNOSIS — L03032 Cellulitis of left toe: Secondary | ICD-10-CM | POA: Diagnosis not present

## 2024-01-11 DIAGNOSIS — B353 Tinea pedis: Secondary | ICD-10-CM

## 2024-01-11 DIAGNOSIS — L84 Corns and callosities: Secondary | ICD-10-CM | POA: Diagnosis not present

## 2024-01-11 DIAGNOSIS — M150 Primary generalized (osteo)arthritis: Secondary | ICD-10-CM | POA: Diagnosis not present

## 2024-01-11 NOTE — Progress Notes (Unsigned)
 Location:  Other [place].  Nursing Home Room Number: [facility] [id number] Place of Service:  SNF [phone]) Harlene An, NP  PCP: Abdul Fine, MD  Patient Care Team: Abdul Fine, MD as PCP - General (Family Medicine) Rennie Cindy SAUNDERS, MD as Consulting Physician (Oncology) Dessa, Reyes ORN, MD as Consulting Physician (General Surgery) Dannielle Arlean FALCON, RN (Inactive) as Oncology Nurse Navigator Lenn Aran, MD as Referring Physician (Radiation Oncology)  Extended Emergency Contact Information Primary Emergency Contact: [facility] Phone: [phone] Relation: Daughter Secondary Emergency Contact: Hicklin,Roxanne Mobile Phone: [phone] Relation: Niece  Goals of care: Advanced Directive information    01/04/2024   11:35 AM  Advanced Directives  Does Patient Have a Medical Advance Directive? Yes  Type of Advance Directive Out of facility DNR (pink MOST or yellow form)  Does patient want to make changes to medical advance directive? No - Patient declined     Chief Complaint  Patient presents with   Toe Red    Toe Red    HPI:  Pt is a 89 y.o. female seen today for an acute visit for red toe.  Pt has had callus on left 2nd toe removed by podiatrist in facility last week. Area had been tender and red. Nursing noted that the redness was slightly worse therefore she was placed on doxycycline and warm epsom salt soaks.  Overall nursing reports redness and tenderness has improved however continues to have red area between toes.  No drainage or tenderness noted.  No wounds/open areas  Staff also notes that she is having more pain right hip, knee and ankle. She has been ambulating more recently. No injury or falls noted.  Staff and daughter question overuse or right leg since she typically has been having issues with left side. Pt denies significant pain when sitting   Past Medical History:  Diagnosis Date   Arthritis    Gout   Chronic kidney  disease    Diarrhea    Hypercholesteremia    Hypertension    Neuropathy    feet and lower legs   Seasonal allergies    Skin cancer    face   Past Surgical History:  Procedure Laterality Date   ABDOMINAL HYSTERECTOMY  2000   APPENDECTOMY     BREAST BIOPSY Right 09/19/2019   affirm bx of calcs UOQ, x marker, path pending   BREAST BIOPSY Right 09/19/2019   us  bx of mass,heart marker, path pending   BREAST BIOPSY Right 09/19/2019   us  bx of LN, coil marker, path pending   CATARACT EXTRACTION Left    CATARACT EXTRACTION W/PHACO Right 10/24/2014   Procedure: CATARACT EXTRACTION PHACO AND INTRAOCULAR LENS PLACEMENT (IOC);  Surgeon: Dene Etienne, MD;  Location: [facility];  Service: Ophthalmology;  Laterality: Right;   ESOPHAGOGASTRODUODENOSCOPY N/A 03/07/2018   Procedure: ESOPHAGOGASTRODUODENOSCOPY (EGD);  Surgeon: Unk Corinn Skiff, MD;  Location: [facility];  Service: Gastroenterology;  Laterality: N/A;   MASTECTOMY MODIFIED RADICAL Right 10/13/2019   Procedure: MASTECTOMY MODIFIED RADICAL;  Surgeon: Dessa Reyes ORN, MD;  Location: [facility] ORS;  Service: General;  Laterality: Right;   RENAL ANGIOGRAPHY Right 04/11/2018   Procedure: RENAL ANGIOGRAPHY;  Surgeon: Marea Selinda RAMAN, MD;  Location: [facility] INVASIVE CV LAB;  Service: Cardiovascular;  Laterality: Right;   SIMPLE MASTECTOMY WITH AXILLARY SENTINEL NODE BIOPSY Left 10/13/2019   Procedure: SIMPLE MASTECTOMY TRUE CUT BIOPSY, SENTINEL NODE BIOPSY;  Surgeon: Dessa Reyes ORN, MD;  Location: [facility]S;  Service: General;  Laterality: Left;  Allergies  Allergen Reactions   Penicillins Anaphylaxis    Patient tolerates ceftriaxone    Empagliflozin  Rash    Outpatient Encounter Medications as of 01/11/2024  Medication Sig   acetaminophen  (TYLENOL ) 500 MG tablet Take 1,000 mg by mouth 3 (three) times daily.   allopurinol  (ZYLOPRIM ) 100 MG tablet Take 150 mg by mouth daily.   Alpha-D-Galactosidase (GAS-X PREVENTION PO) Take  1 capsule by mouth every 8 (eight) hours as needed.   amLODipine  (NORVASC ) 10 MG tablet Take 10 mg by mouth at bedtime.   anastrozole  (ARIMIDEX ) 1 MG tablet Take 1 tablet (1 mg total) by mouth daily.   bismuth subsalicylate (PEPTO BISMOL) 262 MG/15ML suspension Take 30 mLs by mouth every 4 (four) hours as needed.   calcium  carbonate (OSCAL) 1500 (600 Ca) MG TABS tablet Take 1 tablet by mouth 2 (two) times daily with a meal.   cholecalciferol  (VITAMIN D3) 25 MCG (1000 UNIT) tablet Take 1,000 Units by mouth daily.   cyanocobalamin  (VITAMIN B12) 1000 MCG tablet Take 1,000 mcg by mouth daily.   diclofenac  Sodium (VOLTAREN ) 1 % GEL Apply 2 g topically 2 (two) times daily. To bilateral legs.   doxycycline (ADOXA) 100 MG tablet Take 100 mg by mouth 2 (two) times daily.   fluticasone (FLONASE) 50 MCG/ACT nasal spray Place 1 spray into both nostrils daily.   furosemide  (LASIX ) 40 MG tablet Take 0.5 tablets (20 mg total) by mouth daily. Increase to 1 tablet (40 mg total) by mouth daily (total daily dose 40 mg) as needed for up to 3 days for increased leg swelling, shortness of breath, weight gain 5+ lbs over 1-2 days. Seek medical advice if these symptoms are not improving with increased dose. (Patient taking differently: Take 40 mg by mouth daily. Increase to 1 tablet (40 mg total) by mouth daily (total daily dose 40 mg) as needed for up to 3 days for increased leg swelling, shortness of breath, weight gain 5+ lbs over 1-2 days. Seek medical advice if these symptoms are not improving with increased dose.)   guaifenesin  (ROBITUSSIN) 100 MG/5ML syrup Take 200 mg by mouth every 4 (four) hours as needed for cough.   hydrALAZINE  (APRESOLINE ) 50 MG tablet Take 1 tablet (50 mg total) by mouth 3 (three) times daily. Skip the dose if SBP less than 130 mmHg   ketoconazole (NIZORAL) 2 % shampoo Apply topically.   loperamide (IMODIUM) 2 MG capsule Take 2 mg by mouth every 8 (eight) hours as needed for diarrhea or loose  stools.   loratadine  (CLARITIN ) 10 MG tablet Take 10 mg by mouth daily as needed for allergies. AM   LORazepam  (ATIVAN ) 0.5 MG tablet Take 0.5 tablets (0.25 mg total) by mouth daily.   LORazepam  (ATIVAN ) 0.5 MG tablet Give 0.25mg  by mouth 24 hours as needed   magnesium  sulfate (EPSOM SALT) GRAN One application twice daily for warm epsom salt soaks second toe left foot   melatonin 3 MG TABS tablet Take 3 mg by mouth at bedtime.   metoprolol  succinate (TOPROL -XL) 25 MG 24 hr tablet Take 1 tablet (25 mg total) by mouth daily.   OXYGEN Inhale into the lungs. 2lpm   polyethylene glycol (MIRALAX  / GLYCOLAX ) 17 g packet Take 17 g by mouth daily. Skip the dose if no constipation   pregabalin  (LYRICA ) 50 MG capsule Take 1 capsule (50 mg total) by mouth at bedtime.   saccharomyces boulardii (FLORASTOR) 250 MG capsule Take 250 mg by mouth 2 (two) times daily.  sertraline (ZOLOFT) 100 MG tablet Take 100 mg by mouth daily.   sodium bicarbonate  650 MG tablet Take 650 mg by mouth every 8 (eight) hours as needed for heartburn.   VAGINAL LUBRICANT VA Place 1 Application vaginally every 12 (twelve) hours as needed.   valsartan (DIOVAN) 160 MG tablet Take 160 mg by mouth daily.   Wheat Dextrin (BENEFIBER ON THE GO) PACK Take 1 packet by mouth daily as needed.   No facility-administered encounter medications on file as of 01/11/2024.    Review of Systems  Constitutional:  Negative for activity change, appetite change, fatigue and unexpected weight change.  HENT:  Negative for congestion and hearing loss.   Eyes: Negative.   Respiratory:  Negative for cough and shortness of breath.   Cardiovascular:  Negative for chest pain, palpitations and leg swelling.  Gastrointestinal:  Positive for diarrhea. Negative for abdominal pain and constipation.  Genitourinary:  Negative for difficulty urinating and dysuria.  Musculoskeletal:  Positive for arthralgias and myalgias.  Skin:  Positive for color change. Negative for  wound.  Neurological:  Negative for dizziness and weakness.  Psychiatric/Behavioral:  Negative for agitation, behavioral problems and confusion.     Immunization History  Administered Date(s) Administered   Influenza Inj Mdck Quad Pf 03/26/2016, 02/14/2018, 02/15/2019, 02/25/2021, 03/02/2022   Influenza Split 02/20/2015   Influenza, Seasonal, Injecte, Preservative Fre 04/13/2012, 03/17/2013   Influenza,inj,Quad PF,6+ Mos 03/07/2014, 02/20/2020   Influenza-Unspecified 03/26/2016, 02/04/2017, 02/14/2018, 02/25/2021, 04/12/2023   Moderna Sars-Covid-2 Vaccination 05/24/2019, 06/24/2019, 02/22/2020, 02/26/2022   PNEUMOCOCCAL CONJUGATE-20 04/09/2023   Pneumococcal Conjugate-13 09/01/2013   Pneumococcal Polysaccharide-23 04/13/2012   RSV,unspecified 04/09/2023   Td 08/07/2010   Tdap 08/07/2010, 12/31/2023   Zoster Recombinant(Shingrix) 01/04/2024   Zoster, Live 09/12/2007   Pertinent  Health Maintenance Due  Topic Date Due   FOOT EXAM  Never done   INFLUENZA VACCINE  12/10/2023   HEMOGLOBIN A1C  03/15/2024   OPHTHALMOLOGY EXAM  12/02/2024   DEXA SCAN  Completed      02/18/2022    1:36 PM 03/18/2022    4:19 PM 03/18/2022    4:46 PM 05/13/2022   10:17 AM 12/30/2023    8:51 AM  Fall Risk  Falls in the past year?     1  Was there an injury with Fall?     1  Fall Risk Category Calculator     3  (RETIRED) Patient Fall Risk Level High fall risk  Moderate fall risk  Moderate fall risk  High fall risk    Patient at Risk for Falls Due to     History of fall(s);Impaired mobility;Impaired vision;Impaired balance/gait  Fall risk Follow up     Falls evaluation completed     Data saved with a previous flowsheet row definition   Functional Status Survey:    Vitals:   01/11/24 0812  BP: 136/72  Pulse: 72  Resp: 18  Temp: 97.8 F (36.6 C)  SpO2: 95%  Weight: 163 lb 9.6 oz (74.2 kg)  Height: 5' 2 (1.575 m)   Body mass index is 29.92 kg/m. Physical Exam Constitutional:       Appearance: Normal appearance.  Pulmonary:     Effort: Pulmonary effort is normal.  Feet:     Left foot:     Skin integrity: Callus present. No erythema or warmth.     Comments: No redness or tenderness noted to 2nd toe on left Small callus noted Redness noted between toes without warmth or tenderness.  Neurological:  Mental Status: She is alert. Mental status is at baseline.  Psychiatric:        Mood and Affect: Mood normal.     Labs reviewed: Recent Labs    04/01/23 0519 04/12/23 0000 05/08/23 0402 05/09/23 0436 05/10/23 0210 05/11/23 0515 07/03/23 0454 07/05/23 0000 08/27/23 1313 09/06/23 0000 11/18/23 0000 11/30/23 0955 01/06/24 0000  NA 138   < > 136 135 133*   < > 140   < > 138   < > 142 142 140  K 4.4   < > 3.7 3.8 4.0   < > 3.7   < > 3.9   < > 3.9 3.9 4.0  CL 107   < > 108 106 105   < > 105   < > 101   < > 107 113* 105  CO2 21*   < > 19* 18* 19*   < > 25   < > 26   < > 25* 20* 28*  GLUCOSE 156*   < > 136* 119* 137*   < > 113*  --  152*  --   --  186*  --   BUN 39*   < > 31* 34* 39*   < > 28*   < > 42*   < > 63* 46* 38*  CREATININE 1.58*   < > 1.31* 1.47* 1.56*   < > 1.56*   < > 1.86*   < > 2.4* 2.13* 1.9*  CALCIUM  8.5*   < > 8.9 8.8* 8.6*   < > 9.5   < > 12.3*   < > 10.4 10.0 10.9*  MG 2.4  --  1.7 2.0  --   --   --   --   --   --   --   --   --   PHOS 3.0  --  3.3 3.4 2.9  --   --   --   --   --   --   --   --    < > = values in this interval not displayed.   Recent Labs    06/29/23 0823 08/27/23 1313 09/09/23 0000 11/30/23 0955  AST 21 25 18 25   ALT 11 19 10 15   ALKPHOS 61 91 72 79  BILITOT 0.7 0.6  --  0.6  PROT 7.0 8.3*  --  8.1  ALBUMIN  3.5 4.1 3.9 4.3   Recent Labs    07/01/23 0510 07/08/23 0000 08/27/23 1313 09/09/23 0000 11/30/23 0955  WBC 7.1 7.2 9.0 7.5 7.6  NEUTROABS  --  3,262.00 4.9  --  4.1  HGB 9.6* 9.8* 12.3 10.7* 11.1*  HCT 28.4* 31* 36.9 34* 33.4*  MCV 91.0  --  90.0  --  91.8  PLT 233 253 240 222 174   Lab Results   Component Value Date   TSH 2.573 08/26/2020   Lab Results  Component Value Date   HGBA1C 8.0 09/13/2023   Lab Results  Component Value Date   CHOL 75 08/27/2022   HDL 22 (L) 08/27/2022   LDLCALC 34 08/27/2022   TRIG 146 05/08/2023   CHOLHDL 3.4 08/27/2022    Significant Diagnostic Results in last 30 days:  No results found.  Assessment/Plan 1. Callus of toe (Primary) Was sharply debrided by podiatry, overall pain has improved, using pad to protect from rubbing against first toe  2. Cellulitis of toe of left foot Currently on doxycycline with improvement in redness and tenderness, will continue 7  day course.   3. Primary osteoarthritis involving multiple joints Suspect increase in pain to right hip/knee due to overuse from pain on left leg.  Pain mostly to right knee, will have staff apply Voltaren  gel which has been effective in helping with joint pain on left knee and get PT to eval and treat  4. Tinea pedis Terbinafine topical between toes, to clean and dry thoroughly and apply BID for 4 weeks.   Jessica K. Caro BODILY [facility] [phone]

## 2024-01-17 ENCOUNTER — Ambulatory Visit

## 2024-01-17 ENCOUNTER — Other Ambulatory Visit (INDEPENDENT_AMBULATORY_CARE_PROVIDER_SITE_OTHER): Payer: Self-pay | Admitting: Nurse Practitioner

## 2024-01-17 DIAGNOSIS — I701 Atherosclerosis of renal artery: Secondary | ICD-10-CM

## 2024-01-18 ENCOUNTER — Encounter (INDEPENDENT_AMBULATORY_CARE_PROVIDER_SITE_OTHER): Payer: Self-pay | Admitting: Vascular Surgery

## 2024-01-18 ENCOUNTER — Ambulatory Visit (INDEPENDENT_AMBULATORY_CARE_PROVIDER_SITE_OTHER): Admitting: Vascular Surgery

## 2024-01-18 ENCOUNTER — Ambulatory Visit (INDEPENDENT_AMBULATORY_CARE_PROVIDER_SITE_OTHER)

## 2024-01-18 VITALS — BP 128/60 | HR 55 | Ht 62.0 in | Wt 163.0 lb

## 2024-01-18 DIAGNOSIS — N184 Chronic kidney disease, stage 4 (severe): Secondary | ICD-10-CM | POA: Diagnosis not present

## 2024-01-18 DIAGNOSIS — E1122 Type 2 diabetes mellitus with diabetic chronic kidney disease: Secondary | ICD-10-CM

## 2024-01-18 DIAGNOSIS — N1832 Chronic kidney disease, stage 3b: Secondary | ICD-10-CM

## 2024-01-18 DIAGNOSIS — I150 Renovascular hypertension: Secondary | ICD-10-CM

## 2024-01-18 DIAGNOSIS — I701 Atherosclerosis of renal artery: Secondary | ICD-10-CM

## 2024-01-18 NOTE — Assessment & Plan Note (Signed)
 Renal artery duplex today shows her solitary right kidney has a patent right renal artery stent. No role for intervention at this time. Recheck in 6 months with duplex.

## 2024-01-18 NOTE — Assessment & Plan Note (Signed)
 BP control much improved after her intervention. Stable.

## 2024-01-18 NOTE — Progress Notes (Signed)
 MRN : [id number]  Tina Curtis is a 89 y.o. (1926-06-17) female who presents with chief complaint of  Chief Complaint  Patient presents with   Follow-up     6 month follow up with with renal see jd/fb  having some pain in leg  that comes and goes she stated that it feels like a cramp    .  History of Present Illness: Patient returns today in follow up of her renal artery stenosis.  She is not doing as well today as the last time I saw her.  She had a fall with a subdural hematoma and is now in skilled nursing.  She is much less mobile.  She remains very oriented but is more somnolent and has to be awakened.  She is still quite sharp and remembers details about me and my family. Her renal function has declined and her last GFR was under 20.  Her BP has remained fairly normal. Renal artery duplex today shows her solitary right kidney has a patent right renal artery stent.   Current Outpatient Medications  Medication Sig Dispense Refill   acetaminophen  (TYLENOL ) 500 MG tablet Take 1,000 mg by mouth 3 (three) times daily.     allopurinol  (ZYLOPRIM ) 100 MG tablet Take 150 mg by mouth daily.     Alpha-D-Galactosidase (GAS-X PREVENTION PO) Take 1 capsule by mouth every 8 (eight) hours as needed.     amLODipine  (NORVASC ) 10 MG tablet Take 10 mg by mouth at bedtime.     anastrozole  (ARIMIDEX ) 1 MG tablet Take 1 tablet (1 mg total) by mouth daily. 90 tablet 1   bismuth subsalicylate (PEPTO BISMOL) 262 MG/15ML suspension Take 30 mLs by mouth every 4 (four) hours as needed.     calcium  carbonate (OSCAL) 1500 (600 Ca) MG TABS tablet Take 1 tablet by mouth 2 (two) times daily with a meal.     cholecalciferol  (VITAMIN D3) 25 MCG (1000 UNIT) tablet Take 1,000 Units by mouth daily.     cyanocobalamin  (VITAMIN B12) 1000 MCG tablet Take 1,000 mcg by mouth daily.     diclofenac  Sodium (VOLTAREN ) 1 % GEL Apply 2 g topically 2 (two) times daily. To bilateral legs.     doxycycline (ADOXA) 100 MG tablet Take  100 mg by mouth 2 (two) times daily.     fluticasone (FLONASE) 50 MCG/ACT nasal spray Place 1 spray into both nostrils daily.     furosemide  (LASIX ) 40 MG tablet Take 0.5 tablets (20 mg total) by mouth daily. Increase to 1 tablet (40 mg total) by mouth daily (total daily dose 40 mg) as needed for up to 3 days for increased leg swelling, shortness of breath, weight gain 5+ lbs over 1-2 days. Seek medical advice if these symptoms are not improving with increased dose.     guaifenesin  (ROBITUSSIN) 100 MG/5ML syrup Take 200 mg by mouth every 4 (four) hours as needed for cough.     hydrALAZINE  (APRESOLINE ) 50 MG tablet Take 1 tablet (50 mg total) by mouth 3 (three) times daily. Skip the dose if SBP less than 130 mmHg     ketoconazole (NIZORAL) 2 % shampoo Apply topically.     loperamide (IMODIUM) 2 MG capsule Take 2 mg by mouth every 8 (eight) hours as needed for diarrhea or loose stools.     loratadine  (CLARITIN ) 10 MG tablet Take 10 mg by mouth daily as needed for allergies. AM     LORazepam  (ATIVAN ) 0.5 MG tablet Take 0.5  tablets (0.25 mg total) by mouth daily. 45 tablet 1   LORazepam  (ATIVAN ) 0.5 MG tablet Give 0.25mg  by mouth 24 hours as needed     magnesium  sulfate (EPSOM SALT) GRAN One application twice daily for warm epsom salt soaks second toe left foot     melatonin 3 MG TABS tablet Take 3 mg by mouth at bedtime.     metoprolol  succinate (TOPROL -XL) 25 MG 24 hr tablet Take 1 tablet (25 mg total) by mouth daily.     OXYGEN Inhale into the lungs. 2lpm     polyethylene glycol (MIRALAX  / GLYCOLAX ) 17 g packet Take 17 g by mouth daily. Skip the dose if no constipation     pregabalin  (LYRICA ) 50 MG capsule Take 1 capsule (50 mg total) by mouth at bedtime. 90 capsule 2   saccharomyces boulardii (FLORASTOR) 250 MG capsule Take 250 mg by mouth 2 (two) times daily.     sertraline (ZOLOFT) 100 MG tablet Take 100 mg by mouth daily.     sodium bicarbonate  650 MG tablet Take 650 mg by mouth every 8 (eight)  hours as needed for heartburn.     VAGINAL LUBRICANT VA Place 1 Application vaginally every 12 (twelve) hours as needed.     valsartan (DIOVAN) 160 MG tablet Take 160 mg by mouth daily.     Wheat Dextrin (BENEFIBER ON THE GO) PACK Take 1 packet by mouth daily as needed.     No current facility-administered medications for this visit.    Past Medical History:  Diagnosis Date   Arthritis    Gout   Chronic kidney disease    Diarrhea    Hypercholesteremia    Hypertension    Neuropathy    feet and lower legs   Seasonal allergies    Skin cancer    face    Past Surgical History:  Procedure Laterality Date   ABDOMINAL HYSTERECTOMY  2000   APPENDECTOMY     BREAST BIOPSY Right 09/19/2019   affirm bx of calcs UOQ, x marker, path pending   BREAST BIOPSY Right 09/19/2019   us  bx of mass,heart marker, path pending   BREAST BIOPSY Right 09/19/2019   us  bx of LN, coil marker, path pending   CATARACT EXTRACTION Left    CATARACT EXTRACTION W/PHACO Right 10/24/2014   Procedure: CATARACT EXTRACTION PHACO AND INTRAOCULAR LENS PLACEMENT (IOC);  Surgeon: Dene Etienne, MD;  Location: [facility];  Service: Ophthalmology;  Laterality: Right;   ESOPHAGOGASTRODUODENOSCOPY N/A 03/07/2018   Procedure: ESOPHAGOGASTRODUODENOSCOPY (EGD);  Surgeon: Unk Corinn Skiff, MD;  Location: [facility];  Service: Gastroenterology;  Laterality: N/A;   MASTECTOMY MODIFIED RADICAL Right 10/13/2019   Procedure: MASTECTOMY MODIFIED RADICAL;  Surgeon: Dessa Reyes ORN, MD;  Location: [facility];  Service: General;  Laterality: Right;   RENAL ANGIOGRAPHY Right 04/11/2018   Procedure: RENAL ANGIOGRAPHY;  Surgeon: Marea Selinda RAMAN, MD;  Location: [facility];  Service: Cardiovascular;  Laterality: Right;   SIMPLE MASTECTOMY WITH AXILLARY SENTINEL NODE BIOPSY Left 10/13/2019   Procedure: SIMPLE MASTECTOMY TRUE CUT BIOPSY, SENTINEL NODE BIOPSY;  Surgeon: Dessa Reyes ORN, MD;  Location: [facility];   Service: General;  Laterality: Left;     Social History   Tobacco Use   Smoking status: Never   Smokeless tobacco: Never  Vaping Use   Vaping status: Never Used  Substance Use Topics   Alcohol use: No   Drug use: Never      Family History  Problem Relation Age  of Onset   Hypertension Mother    Hypertension Father    Stroke Father    Breast cancer Daughter 58     Allergies  Allergen Reactions   Penicillins Anaphylaxis    Patient tolerates ceftriaxone    Empagliflozin  Rash       REVIEW OF SYSTEMS (Negative unless checked)   Constitutional: [] Weight loss  [] Fever  [] Chills Cardiac: [] Chest pain   [] Chest pressure   [] Palpitations   [] Shortness of breath when laying flat   [] Shortness of breath at rest   [] Shortness of breath with exertion. Vascular:  [] Pain in legs with walking   [] Pain in legs at rest   [] Pain in legs when laying flat   [] Claudication   [] Pain in feet when walking  [] Pain in feet at rest  [] Pain in feet when laying flat   [] History of DVT   [] Phlebitis   [] Swelling in legs   [] Varicose veins   [] Non-healing ulcers Pulmonary:   [] Uses home oxygen   [] Productive cough   [] Hemoptysis   [] Wheeze  [] COPD   [] Asthma Neurologic:  [x] Dizziness  [] Blackouts   [] Seizures   [] History of stroke   [] History of TIA  [] Aphasia   [] Temporary blindness   [] Dysphagia   [] Weakness or numbness in arms   [] Weakness or numbness in legs Musculoskeletal:  [x] Arthritis   [] Joint swelling   [x] Joint pain   [] Low back pain Hematologic:  [] Easy bruising  [] Easy bleeding   [] Hypercoagulable state   [] Anemic   Gastrointestinal:  [] Blood in stool   [] Vomiting blood  [] Gastroesophageal reflux/heartburn   [] Abdominal pain Genitourinary:  [] Chronic kidney disease   [] Difficult urination  [] Frequent urination  [] Burning with urination   [] Hematuria Skin:  [] Rashes   [] Ulcers   [] Wounds Psychological:  [] History of anxiety   [x]  History of major depression.    Physical Examination  BP  128/60   Pulse (!) 55   Ht 5' 2 (1.575 m)   Wt 163 lb (73.9 kg)   BMI 29.81 kg/m  Gen:  WD/WN, NAD. Appears younger than stated age. Head: [place]/AT, No temporalis wasting. Ear/Nose/Throat: Hearing grossly intact, nares w/o erythema or drainage Eyes: Conjunctiva clear. Sclera non-icteric Neck: Supple.  Trachea midline Pulmonary:  Good air movement, no use of accessory muscles.  Cardiac: bradycardic Vascular:  Vessel Right Left  Radial Palpable Palpable           Musculoskeletal: M/S 5/5 throughout.  No deformity or atrophy. 1+ BLE edema. Neurologic: Sensation grossly intact in extremities.  Symmetrical.  Speech is fluent.  Psychiatric: Judgment intact, Mood & affect appropriate for pt's clinical situation. Dermatologic: No rashes or ulcers noted.  No cellulitis or open wounds.      Labs Recent Results (from the past 2160 hours)  Basic metabolic panel with GFR     Status: Abnormal   Collection Time: 11/18/23 12:00 AM  Result Value Ref Range   Glucose 125    BUN 63 (A) 4 - 21   CO2 25 (A) 13 - 22   Creatinine 2.4 (A) 0.5 - 1.1   Potassium 3.9 3.5 - 5.1 mEq/L   Sodium 142 137 - 147   Chloride 107 99 - 108  Comprehensive metabolic panel with GFR     Status: None   Collection Time: 11/18/23 12:00 AM  Result Value Ref Range   eGFR 19    Calcium  10.4 8.7 - 10.7  VITAMIN D  25 Hydroxy (Vit-D Deficiency, Fractures)     Status: None  Collection Time: 11/30/23  9:55 AM  Result Value Ref Range   Vit D, 25-Hydroxy 44.05 30 - 100 ng/mL    Comment: (NOTE) Vitamin D  deficiency has been defined by the Institute of Medicine  and an Endocrine Society practice guideline as a level of serum 25-OH  vitamin D  less than 20 ng/mL (1,2). The Endocrine Society went on to  further define vitamin D  insufficiency as a level between 21 and 29  ng/mL (2).  1. IOM (Institute of Medicine). 2010. Dietary reference intakes for  calcium  and D. [place]: [employer]. 2. Holick  MF, Binkley [place], Bischoff-Ferrari HA, et al. Evaluation,  treatment, and prevention of vitamin D  deficiency: an Endocrine  Society clinical practice guideline, JCEM. 2011 Jul; 96(7): 1911-30.  Performed at [facility], [address] [place], [place] [address]   Cancer antigen 15-3     Status: Abnormal   Collection Time: 11/30/23  9:55 AM  Result Value Ref Range   CA 15-3 38.6 (H) 0.0 - 25.0 U/mL    Comment: (NOTE) Roche Diagnostics Electrochemiluminescence Immunoassay (ECLIA) Values obtained with different assay methods or kits cannot be used interchangeably.  Results cannot be interpreted as absolute evidence of the presence or absence of malignant disease. Performed At: [facility] [address] [place], [place] [id number] Jennette Shorter MD Ey:[id number]   CEA     Status: Abnormal   Collection Time: 11/30/23  9:55 AM  Result Value Ref Range   CEA 5.8 (H) 0.0 - 4.7 ng/mL    Comment: (NOTE)                             Nonsmokers          <3.9                             Smokers             <5.6 Roche Diagnostics Electrochemiluminescence Immunoassay (ECLIA) Values obtained with different assay methods or kits cannot be used interchangeably.  Results cannot be interpreted as absolute evidence of the presence or absence of malignant disease. Performed At: [facility] [address] [place], [place] [id number] Jennette Shorter MD Ey:[id number]   Cancer antigen 27.29     Status: Abnormal   Collection Time: 11/30/23  9:55 AM  Result Value Ref Range   CA 27.29 57.4 (H) 0.0 - 38.6 U/mL    Comment: (NOTE) Siemens Centaur Immunochemiluminometric Methodology [facility]) Values obtained with different assay methods or kits cannot be used interchangeably. Results cannot be interpreted as absolute evidence of the presence or absence of malignant disease. Performed At: [facility] [address] [place], [place] [id number] Jennette Shorter MD  Ey:[id number]   CMP (Cancer Center only)     Status: Abnormal   Collection Time: 11/30/23  9:55 AM  Result Value Ref Range   Sodium 142 135 - 145 mmol/L   Potassium 3.9 3.5 - 5.1 mmol/L   Chloride 113 (H) 98 - 111 mmol/L   CO2 20 (L) 22 - 32 mmol/L   Glucose, Bld 186 (H) 70 - 99 mg/dL    Comment: Glucose reference range applies only to samples taken after fasting for at least 8 hours.   BUN 46 (H) 8 - 23 mg/dL   Creatinine 7.86 (H) 9.55 - 1.00 mg/dL   Calcium  10.0 8.9 - 10.3 mg/dL  Total Protein 8.1 6.5 - 8.1 g/dL   Albumin  4.3 3.5 - 5.0 g/dL   AST 25 15 - 41 U/L   ALT 15 0 - 44 U/L   Alkaline Phosphatase 79 38 - 126 U/L   Total Bilirubin 0.6 0.0 - 1.2 mg/dL   GFR, Estimated 21 (L) >60 mL/min    Comment: (NOTE) Calculated using the CKD-EPI Creatinine Equation (2021)    Anion gap 9 5 - 15    Comment: Performed at [facility], [address] [place], [place] [address]  CBC with Differential (Cancer Center Only)     Status: Abnormal   Collection Time: 11/30/23  9:55 AM  Result Value Ref Range   WBC Count 7.6 4.0 - 10.5 K/uL   RBC 3.64 (L) 3.87 - 5.11 MIL/uL   Hemoglobin 11.1 (L) 12.0 - 15.0 g/dL   HCT 66.5 (L) 63.9 - 53.9 %   MCV 91.8 80.0 - 100.0 fL   MCH 30.5 26.0 - 34.0 pg   MCHC 33.2 30.0 - 36.0 g/dL   RDW 85.2 88.4 - 84.4 %   Platelet Count 174 150 - 400 K/uL   nRBC 0.0 0.0 - 0.2 %   Neutrophils Relative % 54 %   Neutro Abs 4.1 1.7 - 7.7 K/uL   Lymphocytes Relative 30 %   Lymphs Abs 2.3 0.7 - 4.0 K/uL   Monocytes Relative 8 %   Monocytes Absolute 0.6 0.1 - 1.0 K/uL   Eosinophils Relative 6 %   Eosinophils Absolute 0.4 0.0 - 0.5 K/uL   Basophils Relative 1 %   Basophils Absolute 0.1 0.0 - 0.1 K/uL   Immature Granulocytes 1 %   Abs Immature Granulocytes 0.06 0.00 - 0.07 K/uL    Comment: Performed at [facility], [address] [place], [place] [address]  HM DIABETES EYE EXAM     Status: None   Collection Time: 12/03/23 12:00 AM  Result Value  Ref Range   HM Diabetic Eye Exam No Retinopathy No Retinopathy  Basic metabolic panel with GFR     Status: Abnormal   Collection Time: 01/06/24 12:00 AM  Result Value Ref Range   Glucose 115    BUN 38 (A) 4 - 21   CO2 28 (A) 13 - 22   Creatinine 1.9 (A) 0.5 - 1.1   Potassium 4.0 3.5 - 5.1 mEq/L   Sodium 140 137 - 147   Chloride 105 99 - 108  Comprehensive metabolic panel with GFR     Status: Abnormal   Collection Time: 01/06/24 12:00 AM  Result Value Ref Range   eGFR 25    Calcium  10.9 (A) 8.7 - 10.7    Radiology No results found.  Assessment/Plan  Renovascular hypertension BP control much improved after her intervention. Stable.   Type II diabetes mellitus with renal manifestations (HCC) blood glucose control important in reducing the progression of atherosclerotic disease. Also, involved in wound healing. On appropriate medications.   Chronic kidney disease, stage IV (severe) (HCC) Somewhat worse. Avoid contrast unless absolutely necessary and minimize amounts if contrast has to be used.   Renal artery stenosis (HCC) Renal artery duplex today shows her solitary right kidney has a patent right renal artery stent. No role for intervention at this time. Recheck in 6 months with duplex.    Selinda Gu, MD  01/18/2024 12:21 PM    This note was created with Dragon medical transcription system.  Any errors from dictation are purely unintentional

## 2024-01-18 NOTE — Assessment & Plan Note (Signed)
 Somewhat worse. Avoid contrast unless absolutely necessary and minimize amounts if contrast has to be used.

## 2024-01-18 NOTE — Assessment & Plan Note (Signed)
 blood glucose control important in reducing the progression of atherosclerotic disease. Also, involved in wound healing. On appropriate medications.

## 2024-01-19 ENCOUNTER — Inpatient Hospital Stay: Attending: Internal Medicine

## 2024-01-19 ENCOUNTER — Inpatient Hospital Stay: Admitting: Internal Medicine

## 2024-01-19 ENCOUNTER — Encounter: Payer: Self-pay | Admitting: Internal Medicine

## 2024-01-19 VITALS — BP 129/61 | HR 61 | Temp 96.0°F | Resp 16 | Ht 62.0 in | Wt 160.0 lb

## 2024-01-19 DIAGNOSIS — Z1732 Human epidermal growth factor receptor 2 negative status: Secondary | ICD-10-CM | POA: Insufficient documentation

## 2024-01-19 DIAGNOSIS — M170 Bilateral primary osteoarthritis of knee: Secondary | ICD-10-CM | POA: Diagnosis not present

## 2024-01-19 DIAGNOSIS — C50211 Malignant neoplasm of upper-inner quadrant of right female breast: Secondary | ICD-10-CM | POA: Insufficient documentation

## 2024-01-19 DIAGNOSIS — C773 Secondary and unspecified malignant neoplasm of axilla and upper limb lymph nodes: Secondary | ICD-10-CM | POA: Insufficient documentation

## 2024-01-19 DIAGNOSIS — J439 Emphysema, unspecified: Secondary | ICD-10-CM | POA: Diagnosis not present

## 2024-01-19 DIAGNOSIS — Z79811 Long term (current) use of aromatase inhibitors: Secondary | ICD-10-CM | POA: Diagnosis not present

## 2024-01-19 DIAGNOSIS — R519 Headache, unspecified: Secondary | ICD-10-CM | POA: Diagnosis not present

## 2024-01-19 DIAGNOSIS — Z170 Estrogen receptor positive status [ER+]: Secondary | ICD-10-CM | POA: Insufficient documentation

## 2024-01-19 DIAGNOSIS — Z803 Family history of malignant neoplasm of breast: Secondary | ICD-10-CM | POA: Insufficient documentation

## 2024-01-19 DIAGNOSIS — N184 Chronic kidney disease, stage 4 (severe): Secondary | ICD-10-CM | POA: Diagnosis not present

## 2024-01-19 DIAGNOSIS — I272 Pulmonary hypertension, unspecified: Secondary | ICD-10-CM | POA: Insufficient documentation

## 2024-01-19 DIAGNOSIS — Z1721 Progesterone receptor positive status: Secondary | ICD-10-CM | POA: Diagnosis not present

## 2024-01-19 DIAGNOSIS — C50912 Malignant neoplasm of unspecified site of left female breast: Secondary | ICD-10-CM | POA: Insufficient documentation

## 2024-01-19 DIAGNOSIS — K862 Cyst of pancreas: Secondary | ICD-10-CM | POA: Diagnosis not present

## 2024-01-19 DIAGNOSIS — Z923 Personal history of irradiation: Secondary | ICD-10-CM | POA: Insufficient documentation

## 2024-01-19 DIAGNOSIS — Z9013 Acquired absence of bilateral breasts and nipples: Secondary | ICD-10-CM | POA: Diagnosis not present

## 2024-01-19 DIAGNOSIS — E1165 Type 2 diabetes mellitus with hyperglycemia: Secondary | ICD-10-CM | POA: Insufficient documentation

## 2024-01-19 LAB — CBC WITH DIFFERENTIAL (CANCER CENTER ONLY)
Abs Immature Granulocytes: 0.06 K/uL (ref 0.00–0.07)
Basophils Absolute: 0.1 K/uL (ref 0.0–0.1)
Basophils Relative: 1 %
Eosinophils Absolute: 0.4 K/uL (ref 0.0–0.5)
Eosinophils Relative: 5 %
HCT: 32 % — ABNORMAL LOW (ref 36.0–46.0)
Hemoglobin: 10.8 g/dL — ABNORMAL LOW (ref 12.0–15.0)
Immature Granulocytes: 1 %
Lymphocytes Relative: 31 %
Lymphs Abs: 2.3 K/uL (ref 0.7–4.0)
MCH: 31 pg (ref 26.0–34.0)
MCHC: 33.8 g/dL (ref 30.0–36.0)
MCV: 92 fL (ref 80.0–100.0)
Monocytes Absolute: 0.7 K/uL (ref 0.1–1.0)
Monocytes Relative: 9 %
Neutro Abs: 3.9 K/uL (ref 1.7–7.7)
Neutrophils Relative %: 53 %
Platelet Count: 185 K/uL (ref 150–400)
RBC: 3.48 MIL/uL — ABNORMAL LOW (ref 3.87–5.11)
RDW: 16.9 % — ABNORMAL HIGH (ref 11.5–15.5)
WBC Count: 7.4 K/uL (ref 4.0–10.5)
nRBC: 0 % (ref 0.0–0.2)

## 2024-01-19 LAB — CMP (CANCER CENTER ONLY)
ALT: 14 U/L (ref 0–44)
AST: 22 U/L (ref 15–41)
Albumin: 4 g/dL (ref 3.5–5.0)
Alkaline Phosphatase: 92 U/L (ref 38–126)
Anion gap: 10 (ref 5–15)
BUN: 59 mg/dL — ABNORMAL HIGH (ref 8–23)
CO2: 20 mmol/L — ABNORMAL LOW (ref 22–32)
Calcium: 10.8 mg/dL — ABNORMAL HIGH (ref 8.9–10.3)
Chloride: 105 mmol/L (ref 98–111)
Creatinine: 2.25 mg/dL — ABNORMAL HIGH (ref 0.44–1.00)
GFR, Estimated: 20 mL/min — ABNORMAL LOW (ref >60)
Glucose, Bld: 190 mg/dL — ABNORMAL HIGH (ref 70–99)
Potassium: 3.9 mmol/L (ref 3.5–5.1)
Sodium: 135 mmol/L (ref 135–145)
Total Bilirubin: 0.5 mg/dL (ref 0.0–1.2)
Total Protein: 7.8 g/dL (ref 6.5–8.1)

## 2024-01-19 LAB — VITAMIN D 25 HYDROXY (VIT D DEFICIENCY, FRACTURES): Vit D, 25-Hydroxy: 36.11 ng/mL (ref 30–100)

## 2024-01-19 NOTE — Assessment & Plan Note (Addendum)
#   2021-Synchronous bilateral breast cancer-status post bilateral mastectomies- Right Breast-stage III ER/PR positive HER-2 NEG; & Left breast cancer stage II ER/PR Pos; Her 2 NEG. PET scan- April 20th, 2023-  Interval bilateral mastectomy and right axillary node dissection. No evidence of chest wall recurrence or thoracic metastatic disease.  Questionable small hypermetabolic lesion centrally in the right hepatic lobe; MRI LIVER  MAY 25th, 2023- NEGATIVE for any cancer; but cysts [see below].    # CONTINUE  anastrozole  [started in June 2021]. Recommend continue 5-10  No clinical progression of disease or recurrence of breast cancer, however slightly elevated TM-.  Discussed regarding a PET scan for further evaluation however after discussion with PCP-patient chooses not to do the scan.  I think this is reasonable given her age/preference.  Discussed that patient might have other treatment options if progression of disease noted-Faslodex-injection on a monthly basis. ?  CDK inhibitor.  Patient not a candidate for chemotherapy.  See discussion below  # chronic headaches-/tremors/peripheral neuropathy ? stanly Dr.Shah > 4 years];  MRI- Brain May  2023-  No acute intracranial pathology. S/p evaluation with Dr.Shah- on baclofen. Stable.   # JAN 2024- [hospital] acute hypoxic respiratory failure. JAn 2024- CT chest showed chronic findings including radiation fibrosis, emphysema, pulmonary hypertension-patient currently weaned off oxygen- stable  # Elevated BG-[Dr.Baboff- HbA1C- 7.4]; not on medications.  Defer to PCP re:  management of DM- stable   # Mild-moderate arthritic pain-osteoarthritis [bil knee L >R] G-1-2; continue tylenol  prn.  Stable.   # Hypercalcemia- -Intermittently elevated today.  Discontinue vitamin D .  # Fatigue ? Etiology; s/p Evaluation with [facility]- s/p CARE program- Stable.   # CKD- Stage- IV- GFR 29 [Dr.Kolluru]; -Stable.   # NOV 2021- OsteoporosisT-scoreof -3.7 Prolia  q 6 months  [Feb 2023]- BMD- April 2024- stable.  OFF ca+vit D- see above  # MAY 2023- [liver MRI]-  Multi cystic lesions of the pancreas with enlargement measuring 2.6 cm greatest axial dimension; with relative stability since 2021. without high-risk features aside from enlargement since 2019. Recommend in may in 2025- HOLD off for now given multiple medical issues/age etc. Stable.   # Patient stated that she has lived a long and fruitful life-and states that she limited in her activities given the falls and the ongoing fatigue-she is not sure if she would want to continue any further therapies if needed.  I have reached out to patient's PCP Dr. Abdul to discuss this further.  No appointment made-awaiting further discussion with PCP   Prolia  q 70m- 08/27/2023-   # DISPOSITION:  # follow up needed-   Dr.B

## 2024-01-19 NOTE — Progress Notes (Signed)
 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 Burnett] stage III T2N3 [19/24 LN]; ER/PR positive HER-2 negative.   # Left breast-invasive mammary carcinoma-status post mastectomy [id number]; stage I-ER/PR positive HER-2 negative  #November 23, 2019-start adjuvant radiation bilateral [finished August 31]; no chemo;   #September 22nd 2021- anastrozole  ----------------------------------------------------------------------------------------   A. RIGHT BREAST, 3:00; ULTRASOUND-GUIDED BIOPSY:  - INVASIVE MAMMARY CARCINOMA WITH MICROPAPILLARY FEATURES; Size of invasive carcinoma: 10mm in this sample  Histologic grade of invasive carcinoma: Grade 2 ; ER/PR > 905: her 2- NEG  A. RIGHT BREAST, 3:00; ULTRASOUND-GUIDED BIOPSY: - INVASIVE MAMMARY CARCINOMA WITH MICROPAPILLARY FEATURES. 10mm in this sample. Grade 2. Lymphovascular invasion: Present.   B. LYMPH NODE, RIGHT AXILLARY; ULTRASOUND-GUIDED NEEDLE CORE BIOPSY: - METASTATIC MAMMARY CARCINOMA, 6 MM IN THIS SAMPLE.   C. RIGHT BREAST, LATERAL CALCIFICATIONS; STEREOTACTIC BIOPSY: - DUCTAL CARCINOMA IN SITU, INTERMEDIATE GRADE, WITH CALCIFICATIONS. -------------------------------------------------------------   # CKD- Stage III-IV [Dr.Kolluru]  # SURVIVORSHIP:   # GENETICS:   DIAGNOSIS:  Right breast cancer stage III;  left breast cancer-stage I     Carcinoma of upper-inner quadrant of right breast in female,  estrogen receptor positive (HCC)  09/27/2019 Initial Diagnosis   Carcinoma of upper-inner quadrant of right breast in female, estrogen receptor positive (HCC)     HISTORY OF PRESENTING ILLNESS: Ambulating in a wheelchair.  With care giver from the [facility].   Tina Curtis 89 y.o.  female synchronous bilateral breast cancer [right stage III ER/PR positive;Her2 NEG; left stage I ER/PR positive; Her2- NEG] s/p adjuvant radiation; currently on Anastrazole is here for follow-up.   Otherwise patient denies breast pain, has tenderness with palpation. Denies hot flashes. Appetite is fine. Taking her anastrozole .  Chronic fatigue.  Otherwise no evidence of any confusion.   Review of Systems  Constitutional:  Positive for malaise/fatigue. Negative for chills, diaphoresis, fever and weight loss.  HENT:  Negative for nosebleeds and sore throat.   Eyes:  Negative for double vision.  Respiratory:  Negative for cough, hemoptysis, sputum production, shortness of breath and wheezing.   Cardiovascular:  Negative for chest pain, palpitations, orthopnea and leg swelling.  Gastrointestinal:  Negative for abdominal pain, blood in stool, constipation, diarrhea, heartburn, melena, nausea and vomiting.  Genitourinary:  Negative for dysuria, frequency and urgency.  Musculoskeletal:  Positive for back pain and joint pain.  Neurological:  Negative for dizziness, tingling, focal weakness, weakness and headaches.  Endo/Heme/Allergies:  Does not bruise/bleed easily.  Psychiatric/Behavioral:  Negative for depression. The patient is not nervous/anxious and does not have insomnia.      MEDICAL HISTORY:  Past Medical History:  Diagnosis Date   Arthritis    Gout   Chronic kidney disease    Diarrhea    Hypercholesteremia    Hypertension    Neuropathy    feet and lower legs   Seasonal allergies    Skin cancer    face    SURGICAL HISTORY: Past Surgical History:  Procedure Laterality Date   ABDOMINAL  HYSTERECTOMY  2000   APPENDECTOMY     BREAST  BIOPSY Right 09/19/2019   affirm bx of calcs UOQ, x marker, path pending   BREAST BIOPSY Right 09/19/2019   us  bx of mass,heart marker, path pending   BREAST BIOPSY Right 09/19/2019   us  bx of LN, coil marker, path pending   CATARACT EXTRACTION Left    CATARACT EXTRACTION W/PHACO Right 10/24/2014   Procedure: CATARACT EXTRACTION PHACO AND INTRAOCULAR LENS PLACEMENT (IOC);  Surgeon: Dene Etienne, MD;  Location: [facility];  Service: Ophthalmology;  Laterality: Right;   ESOPHAGOGASTRODUODENOSCOPY N/A 03/07/2018   Procedure: ESOPHAGOGASTRODUODENOSCOPY (EGD);  Surgeon: Unk Corinn Skiff, MD;  Location: [facility] ENDOSCOPY;  Service: Gastroenterology;  Laterality: N/A;   MASTECTOMY MODIFIED RADICAL Right 10/13/2019   Procedure: MASTECTOMY MODIFIED RADICAL;  Surgeon: Dessa Reyes ORN, MD;  Location: [facility] ORS;  Service: General;  Laterality: Right;   RENAL ANGIOGRAPHY Right 04/11/2018   Procedure: RENAL ANGIOGRAPHY;  Surgeon: Marea Selinda RAMAN, MD;  Location: [facility] INVASIVE CV LAB;  Service: Cardiovascular;  Laterality: Right;   SIMPLE MASTECTOMY WITH AXILLARY SENTINEL NODE BIOPSY Left 10/13/2019   Procedure: SIMPLE MASTECTOMY TRUE CUT BIOPSY, SENTINEL NODE BIOPSY;  Surgeon: Dessa Reyes ORN, MD;  Location: [facility] ORS;  Service: General;  Laterality: Left;    SOCIAL HISTORY: Social History   Socioeconomic History   Marital status: Widowed    Spouse name: Not on file   Number of children: Not on file   Years of education: Not on file   Highest education level: Not on file  Occupational History   Not on file  Tobacco Use   Smoking status: Never   Smokeless tobacco: Never  Vaping Use   Vaping status: Never Used  Substance and Sexual Activity   Alcohol use: No   Drug use: Never   Sexual activity: Not Currently  Other Topics Concern   Not on file  Social History Narrative   2 daughters; lives in [address]; Runner, broadcasting/film/video retd. No  smoking; no alcohol. Walks cane/walker.    Social Drivers of Corporate investment banker Strain: Low Risk  (11/17/2022)   Received from [facility]   Overall Financial Resource Strain (CARDIA)    Difficulty of Paying Living Expenses: Not hard at all  Food Insecurity: No Food Insecurity (06/30/2023)   Hunger Vital Sign    Worried About Running Out of Food in the Last Year: Never true    Ran Out of Food in the Last Year: Never true  Transportation Needs: No Transportation Needs (06/30/2023)   PRAPARE - Administrator, Civil Service (Medical): No    Lack of Transportation (Non-Medical): No  Physical Activity: Not on file  Stress: Not on file  Social Connections: Unknown (06/30/2023)   Social Connection and Isolation Panel    Frequency of Communication with Friends and Family: Never    Frequency of Social Gatherings with Friends and Family: Never    Attends Religious Services: Never    Database administrator or Organizations: No    Attends Banker Meetings: Never    Marital Status: Patient unable to answer  Intimate Partner Violence: Not At Risk (06/30/2023)   Humiliation, Afraid, Rape, and Kick questionnaire    Fear of Current or Ex-Partner: No    Emotionally Abused: No    Physically Abused: No    Sexually Abused: No    FAMILY HISTORY: Family History  Problem Relation Age of Onset   Hypertension Mother    Hypertension Father    Stroke Father  Breast cancer Daughter 50    ALLERGIES:  is allergic to penicillins and empagliflozin .  MEDICATIONS:  Current Outpatient Medications  Medication Sig Dispense Refill   acetaminophen  (TYLENOL ) 500 MG tablet Take 1,000 mg by mouth 3 (three) times daily.     allopurinol  (ZYLOPRIM ) 100 MG tablet Take 150 mg by mouth daily.     Alpha-D-Galactosidase (GAS-X PREVENTION PO) Take 1 capsule by mouth every 8 (eight) hours as needed.     amLODipine  (NORVASC ) 10 MG tablet Take 10 mg by mouth at bedtime.      anastrozole  (ARIMIDEX ) 1 MG tablet Take 1 tablet (1 mg total) by mouth daily. 90 tablet 1   bismuth subsalicylate (PEPTO BISMOL) 262 MG/15ML suspension Take 30 mLs by mouth every 4 (four) hours as needed.     calcium  carbonate (OSCAL) 1500 (600 Ca) MG TABS tablet Take 1 tablet by mouth 2 (two) times daily with a meal.     cholecalciferol  (VITAMIN D3) 25 MCG (1000 UNIT) tablet Take 1,000 Units by mouth daily.     cyanocobalamin  (VITAMIN B12) 1000 MCG tablet Take 1,000 mcg by mouth daily.     diclofenac  Sodium (VOLTAREN ) 1 % GEL Apply 2 g topically 2 (two) times daily. To bilateral legs.     fluticasone (FLONASE) 50 MCG/ACT nasal spray Place 1 spray into both nostrils daily.     furosemide  (LASIX ) 40 MG tablet Take 0.5 tablets (20 mg total) by mouth daily. Increase to 1 tablet (40 mg total) by mouth daily (total daily dose 40 mg) as needed for up to 3 days for increased leg swelling, shortness of breath, weight gain 5+ lbs over 1-2 days. Seek medical advice if these symptoms are not improving with increased dose.     guaifenesin  (ROBITUSSIN) 100 MG/5ML syrup Take 200 mg by mouth every 4 (four) hours as needed for cough.     hydrALAZINE  (APRESOLINE ) 50 MG tablet Take 1 tablet (50 mg total) by mouth 3 (three) times daily. Skip the dose if SBP less than 130 mmHg     ketoconazole (NIZORAL) 2 % shampoo Apply topically.     loperamide (IMODIUM) 2 MG capsule Take 2 mg by mouth every 8 (eight) hours as needed for diarrhea or loose stools.     loratadine  (CLARITIN ) 10 MG tablet Take 10 mg by mouth daily as needed for allergies. AM     LORazepam  (ATIVAN ) 0.5 MG tablet Take 0.5 tablets (0.25 mg total) by mouth daily. 45 tablet 1   LORazepam  (ATIVAN ) 0.5 MG tablet Give 0.25mg  by mouth 24 hours as needed     magnesium  sulfate (EPSOM SALT) GRAN One application twice daily for warm epsom salt soaks second toe left foot     melatonin 3 MG TABS tablet Take 3 mg by mouth at bedtime.     metoprolol  succinate  (TOPROL -XL) 25 MG 24 hr tablet Take 1 tablet (25 mg total) by mouth daily.     polyethylene glycol (MIRALAX  / GLYCOLAX ) 17 g packet Take 17 g by mouth daily. Skip the dose if no constipation     pregabalin  (LYRICA ) 50 MG capsule Take 1 capsule (50 mg total) by mouth at bedtime. 90 capsule 2   sertraline (ZOLOFT) 100 MG tablet Take 100 mg by mouth daily.     sodium bicarbonate  650 MG tablet Take 650 mg by mouth every 8 (eight) hours as needed for heartburn.     VAGINAL LUBRICANT VA Place 1 Application vaginally every 12 (twelve) hours as needed.  valsartan (DIOVAN) 160 MG tablet Take 160 mg by mouth daily.     Wheat Dextrin (BENEFIBER ON THE GO) PACK Take 1 packet by mouth daily as needed.     doxycycline (ADOXA) 100 MG tablet Take 100 mg by mouth 2 (two) times daily. (Patient not taking: Reported on 01/19/2024)     OXYGEN Inhale into the lungs. 2lpm (Patient not taking: Reported on 01/19/2024)     saccharomyces boulardii (FLORASTOR) 250 MG capsule Take 250 mg by mouth 2 (two) times daily. (Patient not taking: Reported on 01/19/2024)     No current facility-administered medications for this visit.      SABRA  PHYSICAL EXAMINATION: ECOG PERFORMANCE STATUS: 0 - Asymptomatic  Vitals:   01/19/24 1339  BP: 129/61  Pulse: 61  Resp: 16  Temp: (!) 96 F (35.6 C)  SpO2: 97%      Filed Weights   01/19/24 1339  Weight: 160 lb (72.6 kg)       Physical Exam Constitutional:      Comments: Walks with a rolling walker.  Alone.  HENT:     Head: Normocephalic and atraumatic.     Mouth/Throat:     Pharynx: No oropharyngeal exudate.  Eyes:     Pupils: Pupils are equal, round, and reactive to light.  Cardiovascular:     Rate and Rhythm: Normal rate and regular rhythm.  Pulmonary:     Effort: Pulmonary effort is normal. No respiratory distress.     Breath sounds: Normal breath sounds. No wheezing.  Abdominal:     General: Bowel sounds are normal. There is no distension.     Palpations:  Abdomen is soft. There is no mass.     Tenderness: There is no abdominal tenderness. There is no guarding or rebound.  Musculoskeletal:        General: No tenderness. Normal range of motion.     Cervical back: Normal range of motion and neck supple.  Skin:    General: Skin is warm.     Comments: Bruising bruising noted on the chest and left arm.  Neurological:     Mental Status: She is alert and oriented to person, place, and time.  Psychiatric:        Mood and Affect: Affect normal.     Results for VALORI, HOLLENKAMP (MRN [id number]) as of 01/15/2021 15:37  Ref. Range 08/27/2020 09:55 09/02/2020 12:32 10/22/2020 14:01 12/04/2020 14:35 01/15/2021 14:50  CA 15-3 Latest Ref Range: 0.0 - 25.0 U/mL   32.0 (H) 27.9 (H)   CA 27.29 Latest Ref Range: 0.0 - 38.6 U/mL   38.0 27.8   CEA Latest Ref Range: 0.0 - 4.7 ng/mL   4.1 3.6    LABORATORY DATA:  I have reviewed the data as listed Lab Results  Component Value Date   WBC 7.4 01/19/2024   HGB 10.8 (L) 01/19/2024   HCT 32.0 (L) 01/19/2024   MCV 92.0 01/19/2024   PLT 185 01/19/2024   Recent Labs    08/27/23 1313 09/06/23 0000 09/09/23 0000 10/18/23 0000 11/30/23 0955 01/06/24 0000 01/19/24 1332  NA 138  --  141   < > 142 140 135  K 3.9  --  4.7   < > 3.9 4.0 3.9  CL 101  --  112*   < > 113* 105 105  CO2 26  --  21   < > 20* 28* 20*  GLUCOSE 152*  --   --   --  186*  --  190*  BUN 42*  --  28*   < > 46* 38* 59*  CREATININE 1.86*  --  1.4*   < > 2.13* 1.9* 2.25*  CALCIUM  12.3*   < > 8.7   < > 10.0 10.9* 10.8*  GFRNONAA 25*  --   --   --  21*  --  20*  PROT 8.3*  --   --   --  8.1  --  7.8  ALBUMIN  4.1  --  3.9  --  4.3  --  4.0  AST 25  --  18  --  25  --  22  ALT 19  --  10  --  15  --  14  ALKPHOS 91  --  72  --  79  --  92  BILITOT 0.6  --   --   --  0.6  --  0.5   < > = values in this interval not displayed.    RADIOGRAPHIC STUDIES: I have personally reviewed the radiological images as listed and agreed with the findings in the  report. VAS US  RENAL ARTERY DUPLEX Result Date: 01/19/2024 ABDOMINAL VISCERAL Patient Name:  TONDA WIEDERHOLD  Date of Exam:   01/18/2024 Medical Rec #: [id number]      Accession #:    [id number] Date of Birth: 08-28-1926     Patient Gender: F Patient Age:   52 years Exam [facility] Procedure:      VAS US  RENAL ARTERY DUPLEX Referring Phys: [id number] ORVIN FORBES DARING -------------------------------------------------------------------------------- Indications: Lt atrophied kidney; Rt RAS and stent placed High Risk Factors: Hypertension. Vascular Interventions: 04/11/2018 Right RA stent placed. Comparison Study: 07/2023 Performing Technologist: Jerel Croak RVT  Examination Guidelines: A complete evaluation includes B-mode imaging, spectral Doppler, color Doppler, and power Doppler as needed of all accessible portions of each vessel. Bilateral testing is considered an integral part of a complete examination. Limited examinations for reoccurring indications may be performed as noted.  Duplex Findings: +------------+--------+--------+------+-----------+ Mesenteric  PSV cm/sEDV cm/sPlaque Comments   +------------+--------+--------+------+-----------+ Aorta Prox    100                 1.96x1.93cm +------------+--------+--------+------+-----------+ Aorta Mid      97                 3.55x3.30cm +------------+--------+--------+------+-----------+ Aorta Distal   55                 1.97x1.33cm +------------+--------+--------+------+-----------+    +------------------+--------+--------+-------+ Right Renal ArteryPSV cm/sEDV cm/sComment +------------------+--------+--------+-------+ Proximal            115                   +------------------+--------+--------+-------+ Mid                 153                   +------------------+--------+--------+-------+ Distal              105                   +------------------+--------+--------+-------+  +------------+--------+--------+----+-----------+--------+--------+---+ Right KidneyPSV cm/sEDV cm/sRI  Left KidneyPSV cm/sEDV cm/sRI  +------------+--------+--------+----+-----------+--------+--------+---+ Upper Pole                      Upper Pole                     +------------+--------+--------+----+-----------+--------+--------+---+  Mid         63      19      0.                            +------------+--------+--------+----+-----------+--------+--------+---+ Lower Pole                      Lower Pole                     +------------+--------+--------+----+-----------+--------+--------+---+ Hilar                           Hilar                          +------------+--------+--------+----+-----------+--------+--------+---+ +------------------+-----+------------------++ Right Kidney           Left Kidney        +------------------+-----+------------------++ RAR                    RAR                +------------------+-----+------------------++ RAR (manual)      1.57 RAR (manual)       +------------------+-----+------------------++ Cortex                 Cortex             +------------------+-----+------------------++ Cortex thickness       Corex thickness    +------------------+-----+------------------++ Kidney length (cm)10.03Kidney length (cm) +------------------+-----+------------------++  Summary: Largest Aortic Diameter: 3.6 cm  Renal:  Right: Normal size right kidney. Normal right Resisitive Index.        Normal cortical thickness of right kidney. 1-59% stenosis of        the right renal artery. RRV flow present. Left:  Atrophied kidney x many years. Mesenteric:  AAA visualized again in mid Aorta measuring 3.55x3.30cm. Essentially no change from previous study.  *See table(s) above for measurements and observations.  Diagnosing physician: Selinda Gu MD  Electronically signed by Selinda Gu MD on 01/19/2024 at 7:26:39 AM.     Final      ASSESSMENT & PLAN:   Carcinoma of upper-inner quadrant of right breast in female, estrogen receptor positive (HCC)  # 2021-Synchronous bilateral breast cancer-status post bilateral mastectomies- Right Breast-stage III ER/PR positive HER-2 NEG; & Left breast cancer stage II ER/PR Pos; Her 2 NEG. PET scan- April 20th, 2023-  Interval bilateral mastectomy and right axillary node dissection. No evidence of chest wall recurrence or thoracic metastatic disease.  Questionable small hypermetabolic lesion centrally in the right hepatic lobe; MRI LIVER  MAY 25th, 2023- NEGATIVE for any cancer; but cysts [see below].    # CONTINUE  anastrozole  [started in June 2021]. Recommend continue 5-10  No clinical progression of disease or recurrence of breast cancer, however slightly elevated TM-.  Discussed regarding a PET scan for further evaluation however after discussion with PCP-patient chooses not to do the scan.  I think this is reasonable given her age/preference.  Discussed that patient might have other treatment options if progression of disease noted-Faslodex-injection on a monthly basis. ?  CDK inhibitor.  Patient not a candidate for chemotherapy.  See discussion below  # chronic headaches-/tremors/peripheral neuropathy ? stanly Dr.Shah > 4 years];  MRI- Brain May  2023-  No acute intracranial pathology. S/p evaluation with Dr.Shah- on baclofen. Stable.   #  JAN 2024- [hospital] acute hypoxic respiratory failure. JAn 2024- CT chest showed chronic findings including radiation fibrosis, emphysema, pulmonary hypertension-patient currently weaned off oxygen- stable  # Elevated BG-[Dr.Baboff- HbA1C- 7.4]; not on medications.  Defer to PCP re:  management of DM- stable   # Mild-moderate arthritic pain-osteoarthritis [bil knee L >R] G-1-2; continue tylenol  prn.  Stable.   # Hypercalcemia- -Intermittently elevated today.  Discontinue vitamin D .  # Fatigue ? Etiology; s/p Evaluation with [facility]- s/p  CARE program- Stable.   # CKD- Stage- IV- GFR 29 [Dr.Kolluru]; -Stable.   # NOV 2021- OsteoporosisT-scoreof -3.7 Prolia  q 6 months [Feb 2023]- BMD- April 2024- stable.  OFF ca+vit D- see above  # MAY 2023- [liver MRI]-  Multi cystic lesions of the pancreas with enlargement measuring 2.6 cm greatest axial dimension; with relative stability since 2021. without high-risk features aside from enlargement since 2019. Recommend in may in 2025- HOLD off for now given multiple medical issues/age etc. Stable.   # Patient stated that she has lived a long and fruitful life-and states that she limited in her activities given the falls and the ongoing fatigue-she is not sure if she would want to continue any further therapies if needed.  I have reached out to patient's PCP Dr. Abdul to discuss this further.  No appointment made-awaiting further discussion with PCP   Prolia  q 4m- 08/27/2023-   # DISPOSITION:  # follow up needed-   Dr.B    Cindy JONELLE Joe, MD 01/19/2024 4:16 PM

## 2024-01-19 NOTE — Progress Notes (Signed)
 Pt would like to know how long does she keep coming to see you? She cancelled the PET, would like to discuss.

## 2024-01-20 LAB — CEA: CEA: 4.1 ng/mL (ref 0.0–4.7)

## 2024-01-20 LAB — CANCER ANTIGEN 15-3: CA 15-3: 40.1 U/mL — ABNORMAL HIGH (ref 0.0–25.0)

## 2024-01-20 LAB — CANCER ANTIGEN 27.29: CA 27.29: 57.5 U/mL — ABNORMAL HIGH (ref 0.0–38.6)

## 2024-01-24 ENCOUNTER — Ambulatory Visit: Payer: Self-pay | Admitting: Internal Medicine

## 2024-01-24 NOTE — Telephone Encounter (Signed)
 Spoke to patient's PCP Dr.Beamer- re: follow up. Give age./frailty- and given patient preference HOLD further imaging-like PET scan.          I would recommend- continued AI- given high risk disease. Patient will follow up with PCP/and follow up with at cancer center as needed.   GB

## 2024-01-25 ENCOUNTER — Other Ambulatory Visit: Payer: Self-pay | Admitting: Nurse Practitioner

## 2024-01-25 MED ORDER — PREGABALIN 50 MG PO CAPS: 50.0000 mg | ORAL_CAPSULE | Freq: Every day | ORAL | 2 refills | Status: DC

## 2024-02-11 ENCOUNTER — Other Ambulatory Visit

## 2024-02-14 ENCOUNTER — Ambulatory Visit: Admitting: Podiatry

## 2024-02-22 ENCOUNTER — Emergency Department

## 2024-02-22 ENCOUNTER — Inpatient Hospital Stay
Admission: EM | Admit: 2024-02-22 | Discharge: 2024-03-11 | DRG: 871 | Disposition: E | Source: Skilled Nursing Facility | Attending: Internal Medicine | Admitting: Internal Medicine

## 2024-02-22 ENCOUNTER — Encounter: Payer: Self-pay | Admitting: Emergency Medicine

## 2024-02-22 ENCOUNTER — Other Ambulatory Visit: Payer: Self-pay

## 2024-02-22 DIAGNOSIS — E1140 Type 2 diabetes mellitus with diabetic neuropathy, unspecified: Secondary | ICD-10-CM | POA: Diagnosis present

## 2024-02-22 DIAGNOSIS — J439 Emphysema, unspecified: Secondary | ICD-10-CM | POA: Diagnosis present

## 2024-02-22 DIAGNOSIS — F419 Anxiety disorder, unspecified: Secondary | ICD-10-CM | POA: Diagnosis present

## 2024-02-22 DIAGNOSIS — I5030 Unspecified diastolic (congestive) heart failure: Secondary | ICD-10-CM | POA: Diagnosis present

## 2024-02-22 DIAGNOSIS — E876 Hypokalemia: Secondary | ICD-10-CM | POA: Diagnosis present

## 2024-02-22 DIAGNOSIS — J440 Chronic obstructive pulmonary disease with acute lower respiratory infection: Secondary | ICD-10-CM | POA: Diagnosis present

## 2024-02-22 DIAGNOSIS — Z85828 Personal history of other malignant neoplasm of skin: Secondary | ICD-10-CM

## 2024-02-22 DIAGNOSIS — J9601 Acute respiratory failure with hypoxia: Secondary | ICD-10-CM | POA: Diagnosis present

## 2024-02-22 DIAGNOSIS — Z1152 Encounter for screening for COVID-19: Secondary | ICD-10-CM

## 2024-02-22 DIAGNOSIS — R112 Nausea with vomiting, unspecified: Secondary | ICD-10-CM | POA: Diagnosis present

## 2024-02-22 DIAGNOSIS — E1122 Type 2 diabetes mellitus with diabetic chronic kidney disease: Secondary | ICD-10-CM | POA: Diagnosis present

## 2024-02-22 DIAGNOSIS — Z789 Other specified health status: Secondary | ICD-10-CM | POA: Diagnosis not present

## 2024-02-22 DIAGNOSIS — F32A Depression, unspecified: Secondary | ICD-10-CM | POA: Diagnosis present

## 2024-02-22 DIAGNOSIS — J690 Pneumonitis due to inhalation of food and vomit: Secondary | ICD-10-CM | POA: Diagnosis present

## 2024-02-22 DIAGNOSIS — I2510 Atherosclerotic heart disease of native coronary artery without angina pectoris: Secondary | ICD-10-CM | POA: Diagnosis present

## 2024-02-22 DIAGNOSIS — R6520 Severe sepsis without septic shock: Secondary | ICD-10-CM | POA: Diagnosis present

## 2024-02-22 DIAGNOSIS — E7800 Pure hypercholesterolemia, unspecified: Secondary | ICD-10-CM | POA: Diagnosis present

## 2024-02-22 DIAGNOSIS — N184 Chronic kidney disease, stage 4 (severe): Secondary | ICD-10-CM | POA: Diagnosis present

## 2024-02-22 DIAGNOSIS — Z880 Allergy status to penicillin: Secondary | ICD-10-CM

## 2024-02-22 DIAGNOSIS — Z7189 Other specified counseling: Secondary | ICD-10-CM | POA: Diagnosis not present

## 2024-02-22 DIAGNOSIS — K5792 Diverticulitis of intestine, part unspecified, without perforation or abscess without bleeding: Principal | ICD-10-CM

## 2024-02-22 DIAGNOSIS — Z9011 Acquired absence of right breast and nipple: Secondary | ICD-10-CM

## 2024-02-22 DIAGNOSIS — I150 Renovascular hypertension: Secondary | ICD-10-CM | POA: Diagnosis present

## 2024-02-22 DIAGNOSIS — C50919 Malignant neoplasm of unspecified site of unspecified female breast: Secondary | ICD-10-CM | POA: Diagnosis present

## 2024-02-22 DIAGNOSIS — K219 Gastro-esophageal reflux disease without esophagitis: Secondary | ICD-10-CM | POA: Diagnosis present

## 2024-02-22 DIAGNOSIS — Z823 Family history of stroke: Secondary | ICD-10-CM

## 2024-02-22 DIAGNOSIS — Z66 Do not resuscitate: Secondary | ICD-10-CM | POA: Diagnosis present

## 2024-02-22 DIAGNOSIS — E1129 Type 2 diabetes mellitus with other diabetic kidney complication: Secondary | ICD-10-CM | POA: Diagnosis present

## 2024-02-22 DIAGNOSIS — A419 Sepsis, unspecified organism: Secondary | ICD-10-CM | POA: Diagnosis present

## 2024-02-22 DIAGNOSIS — I7143 Infrarenal abdominal aortic aneurysm, without rupture: Secondary | ICD-10-CM | POA: Diagnosis present

## 2024-02-22 DIAGNOSIS — I890 Lymphedema, not elsewhere classified: Secondary | ICD-10-CM | POA: Diagnosis present

## 2024-02-22 DIAGNOSIS — Z8673 Personal history of transient ischemic attack (TIA), and cerebral infarction without residual deficits: Secondary | ICD-10-CM

## 2024-02-22 DIAGNOSIS — R808 Other proteinuria: Secondary | ICD-10-CM | POA: Diagnosis present

## 2024-02-22 DIAGNOSIS — Z853 Personal history of malignant neoplasm of breast: Secondary | ICD-10-CM | POA: Diagnosis not present

## 2024-02-22 DIAGNOSIS — J9621 Acute and chronic respiratory failure with hypoxia: Secondary | ICD-10-CM | POA: Diagnosis not present

## 2024-02-22 DIAGNOSIS — Z803 Family history of malignant neoplasm of breast: Secondary | ICD-10-CM

## 2024-02-22 DIAGNOSIS — I5042 Chronic combined systolic (congestive) and diastolic (congestive) heart failure: Secondary | ICD-10-CM | POA: Diagnosis present

## 2024-02-22 DIAGNOSIS — Z515 Encounter for palliative care: Secondary | ICD-10-CM | POA: Diagnosis not present

## 2024-02-22 DIAGNOSIS — K862 Cyst of pancreas: Secondary | ICD-10-CM | POA: Diagnosis present

## 2024-02-22 DIAGNOSIS — I252 Old myocardial infarction: Secondary | ICD-10-CM

## 2024-02-22 DIAGNOSIS — J189 Pneumonia, unspecified organism: Secondary | ICD-10-CM | POA: Diagnosis present

## 2024-02-22 DIAGNOSIS — Z79899 Other long term (current) drug therapy: Secondary | ICD-10-CM

## 2024-02-22 DIAGNOSIS — I110 Hypertensive heart disease with heart failure: Secondary | ICD-10-CM | POA: Diagnosis present

## 2024-02-22 DIAGNOSIS — D72829 Elevated white blood cell count, unspecified: Secondary | ICD-10-CM | POA: Diagnosis present

## 2024-02-22 DIAGNOSIS — K5732 Diverticulitis of large intestine without perforation or abscess without bleeding: Secondary | ICD-10-CM | POA: Diagnosis present

## 2024-02-22 DIAGNOSIS — Z8249 Family history of ischemic heart disease and other diseases of the circulatory system: Secondary | ICD-10-CM

## 2024-02-22 DIAGNOSIS — M109 Gout, unspecified: Secondary | ICD-10-CM | POA: Diagnosis present

## 2024-02-22 DIAGNOSIS — N1832 Chronic kidney disease, stage 3b: Secondary | ICD-10-CM | POA: Diagnosis not present

## 2024-02-22 DIAGNOSIS — Z90710 Acquired absence of both cervix and uterus: Secondary | ICD-10-CM

## 2024-02-22 DIAGNOSIS — Z79811 Long term (current) use of aromatase inhibitors: Secondary | ICD-10-CM

## 2024-02-22 DIAGNOSIS — R410 Disorientation, unspecified: Secondary | ICD-10-CM | POA: Diagnosis present

## 2024-02-22 LAB — CBC WITH DIFFERENTIAL/PLATELET
Abs Immature Granulocytes: 0.16 K/uL — ABNORMAL HIGH (ref 0.00–0.07)
Basophils Absolute: 0.1 K/uL (ref 0.0–0.1)
Basophils Relative: 1 %
Eosinophils Absolute: 0.2 K/uL (ref 0.0–0.5)
Eosinophils Relative: 1 %
HCT: 29.7 % — ABNORMAL LOW (ref 36.0–46.0)
Hemoglobin: 9.7 g/dL — ABNORMAL LOW (ref 12.0–15.0)
Immature Granulocytes: 1 %
Lymphocytes Relative: 12 %
Lymphs Abs: 1.6 K/uL (ref 0.7–4.0)
MCH: 31.5 pg (ref 26.0–34.0)
MCHC: 32.7 g/dL (ref 30.0–36.0)
MCV: 96.4 fL (ref 80.0–100.0)
Monocytes Absolute: 1.4 K/uL — ABNORMAL HIGH (ref 0.1–1.0)
Monocytes Relative: 11 %
Neutro Abs: 9.8 K/uL — ABNORMAL HIGH (ref 1.7–7.7)
Neutrophils Relative %: 74 %
Platelets: 170 K/uL (ref 150–400)
RBC: 3.08 MIL/uL — ABNORMAL LOW (ref 3.87–5.11)
RDW: 16.2 % — ABNORMAL HIGH (ref 11.5–15.5)
WBC: 13.2 K/uL — ABNORMAL HIGH (ref 4.0–10.5)
nRBC: 0.2 % (ref 0.0–0.2)

## 2024-02-22 LAB — COMPREHENSIVE METABOLIC PANEL WITH GFR
ALT: 13 U/L (ref 0–44)
AST: 21 U/L (ref 15–41)
Albumin: 3.9 g/dL (ref 3.5–5.0)
Alkaline Phosphatase: 79 U/L (ref 38–126)
Anion gap: 9 (ref 5–15)
BUN: 45 mg/dL — ABNORMAL HIGH (ref 8–23)
CO2: 22 mmol/L (ref 22–32)
Calcium: 9.8 mg/dL (ref 8.9–10.3)
Chloride: 110 mmol/L (ref 98–111)
Creatinine, Ser: 2.24 mg/dL — ABNORMAL HIGH (ref 0.44–1.00)
GFR, Estimated: 20 mL/min — ABNORMAL LOW (ref >60)
Glucose, Bld: 191 mg/dL — ABNORMAL HIGH (ref 70–99)
Potassium: 3.3 mmol/L — ABNORMAL LOW (ref 3.5–5.1)
Sodium: 141 mmol/L (ref 135–145)
Total Bilirubin: 0.5 mg/dL (ref 0.0–1.2)
Total Protein: 7.5 g/dL (ref 6.5–8.1)

## 2024-02-22 LAB — PROTIME-INR
INR: 1 (ref 0.8–1.2)
Prothrombin Time: 13.3 s (ref 11.4–15.2)

## 2024-02-22 LAB — URINALYSIS, ROUTINE W REFLEX MICROSCOPIC
Bacteria, UA: NONE SEEN
Bilirubin Urine: NEGATIVE
Glucose, UA: NEGATIVE mg/dL
Hgb urine dipstick: NEGATIVE
Ketones, ur: NEGATIVE mg/dL
Leukocytes,Ua: NEGATIVE
Nitrite: NEGATIVE
Protein, ur: 100 mg/dL — AB
Specific Gravity, Urine: 1.015 (ref 1.005–1.030)
Squamous Epithelial / HPF: 0 /HPF (ref 0–5)
pH: 5 (ref 5.0–8.0)

## 2024-02-22 LAB — RESP PANEL BY RT-PCR (RSV, FLU A&B, COVID)  RVPGX2
Influenza A by PCR: NEGATIVE
Influenza B by PCR: NEGATIVE
Resp Syncytial Virus by PCR: NEGATIVE
SARS Coronavirus 2 by RT PCR: NEGATIVE

## 2024-02-22 LAB — GLUCOSE, CAPILLARY
Glucose-Capillary: 95 mg/dL (ref 70–99)
Glucose-Capillary: 132 mg/dL — ABNORMAL HIGH (ref 70–99)

## 2024-02-22 LAB — LACTIC ACID, PLASMA: Lactic Acid, Venous: 0.9 mmol/L (ref 0.5–1.9)

## 2024-02-22 LAB — LIPASE, BLOOD: Lipase: 50 U/L (ref 11–51)

## 2024-02-22 LAB — PROCALCITONIN: Procalcitonin: <0.1 ng/mL

## 2024-02-22 MED ORDER — ACETAMINOPHEN 325 MG PO TABS
650.0000 mg | ORAL_TABLET | Freq: Once | ORAL | Status: AC
  Administered 2024-02-22: 650 mg via ORAL
  Filled 2024-02-22: qty 2

## 2024-02-22 MED ORDER — POTASSIUM CHLORIDE 10 MEQ/100ML IV SOLN
10.0000 meq | INTRAVENOUS | Status: AC
  Administered 2024-02-22 (×3): 10 meq via INTRAVENOUS
  Filled 2024-02-22 (×3): qty 100

## 2024-02-22 MED ORDER — MORPHINE SULFATE (PF) 2 MG/ML IV SOLN
2.0000 mg | INTRAVENOUS | Status: DC | PRN
  Administered 2024-02-22 – 2024-02-26 (×7): 2 mg via INTRAVENOUS
  Filled 2024-02-22 (×7): qty 1

## 2024-02-22 MED ORDER — HYDRALAZINE HCL 50 MG PO TABS
50.0000 mg | ORAL_TABLET | Freq: Three times a day (TID) | ORAL | Status: AC
Start: 2024-02-22 — End: ?
  Administered 2024-02-22 – 2024-02-24 (×7): 50 mg via ORAL
  Filled 2024-02-22 (×7): qty 1

## 2024-02-22 MED ORDER — LACTATED RINGERS IV BOLUS (SEPSIS)
1000.0000 mL | Freq: Once | INTRAVENOUS | Status: AC
  Administered 2024-02-22: 1000 mL via INTRAVENOUS

## 2024-02-22 MED ORDER — SODIUM CHLORIDE 0.9 % IV SOLN
500.0000 mg | INTRAVENOUS | Status: DC
  Administered 2024-02-23: 500 mg via INTRAVENOUS
  Filled 2024-02-22: qty 5

## 2024-02-22 MED ORDER — DICLOFENAC SODIUM 1 % EX GEL
2.0000 g | Freq: Two times a day (BID) | CUTANEOUS | Status: DC
  Administered 2024-02-22 – 2024-02-26 (×9): 2 g via TOPICAL
  Filled 2024-02-22: qty 100

## 2024-02-22 MED ORDER — POLYETHYLENE GLYCOL 3350 17 G PO PACK: 17.0000 g | PACK | Freq: Every day | ORAL | Status: DC | PRN

## 2024-02-22 MED ORDER — ONDANSETRON HCL 4 MG/2ML IJ SOLN
4.0000 mg | INTRAMUSCULAR | Status: AC
  Administered 2024-02-22: 4 mg via INTRAVENOUS
  Filled 2024-02-22: qty 2

## 2024-02-22 MED ORDER — INSULIN ASPART 100 UNIT/ML IJ SOLN: 0.0000 [IU] | Freq: Three times a day (TID) | INTRAMUSCULAR | Status: DC

## 2024-02-22 MED ORDER — PREGABALIN 50 MG PO CAPS
50.0000 mg | ORAL_CAPSULE | Freq: Every day | ORAL | Status: DC
  Administered 2024-02-22 – 2024-02-23 (×2): 50 mg via ORAL
  Filled 2024-02-22 (×2): qty 1

## 2024-02-22 MED ORDER — ONDANSETRON HCL 4 MG/2ML IJ SOLN
4.0000 mg | Freq: Four times a day (QID) | INTRAMUSCULAR | Status: DC | PRN
  Administered 2024-02-22 (×2): 4 mg via INTRAVENOUS
  Filled 2024-02-22 (×2): qty 2

## 2024-02-22 MED ORDER — POTASSIUM CHLORIDE IN NACL 20-0.9 MEQ/L-% IV SOLN
INTRAVENOUS | Status: DC
  Filled 2024-02-22 (×3): qty 1000

## 2024-02-22 MED ORDER — ACETAMINOPHEN 325 MG PO TABS
650.0000 mg | ORAL_TABLET | Freq: Four times a day (QID) | ORAL | Status: DC | PRN
  Administered 2024-02-23: 650 mg via ORAL
  Filled 2024-02-22: qty 2

## 2024-02-22 MED ORDER — AMLODIPINE BESYLATE 10 MG PO TABS
10.0000 mg | ORAL_TABLET | Freq: Every day | ORAL | Status: AC
Start: 2024-02-22 — End: ?
  Administered 2024-02-22 – 2024-02-23 (×2): 10 mg via ORAL
  Filled 2024-02-22 (×2): qty 1

## 2024-02-22 MED ORDER — MELATONIN 5 MG PO TABS
5.0000 mg | ORAL_TABLET | Freq: Every day | ORAL | Status: DC
Start: 2024-02-22 — End: 2024-02-27
  Administered 2024-02-22 – 2024-02-23 (×2): 5 mg via ORAL
  Filled 2024-02-22 (×2): qty 1

## 2024-02-22 MED ORDER — IRBESARTAN 75 MG PO TABS
37.5000 mg | ORAL_TABLET | Freq: Every day | ORAL | Status: AC
Start: 2024-02-22 — End: ?
  Administered 2024-02-22 – 2024-02-24 (×3): 37.5 mg via ORAL
  Filled 2024-02-22 (×5): qty 0.5

## 2024-02-22 MED ORDER — SODIUM CHLORIDE 0.9 % IV SOLN: 500.0000 mg | INTRAVENOUS | Status: DC

## 2024-02-22 MED ORDER — HEPARIN SODIUM (PORCINE) 5000 UNIT/ML IJ SOLN
5000.0000 [IU] | Freq: Three times a day (TID) | INTRAMUSCULAR | Status: DC
  Administered 2024-02-22 – 2024-02-23 (×3): 5000 [IU] via SUBCUTANEOUS
  Filled 2024-02-22 (×3): qty 1

## 2024-02-22 MED ORDER — METOPROLOL SUCCINATE ER 50 MG PO TB24
25.0000 mg | ORAL_TABLET | Freq: Every day | ORAL | Status: DC
Start: 2024-02-22 — End: 2024-02-27
  Administered 2024-02-22 – 2024-02-25 (×4): 25 mg via ORAL
  Filled 2024-02-22 (×5): qty 1

## 2024-02-22 MED ORDER — FUROSEMIDE 10 MG/ML IJ SOLN
20.0000 mg | Freq: Two times a day (BID) | INTRAMUSCULAR | Status: DC
  Administered 2024-02-22 – 2024-02-23 (×2): 20 mg via INTRAVENOUS
  Filled 2024-02-22 (×2): qty 2

## 2024-02-22 MED ORDER — ACETAMINOPHEN 650 MG RE SUPP
650.0000 mg | Freq: Four times a day (QID) | RECTAL | Status: DC | PRN
  Administered 2024-02-24: 650 mg via RECTAL
  Filled 2024-02-22: qty 1

## 2024-02-22 MED ORDER — ONDANSETRON HCL 4 MG PO TABS: 4.0000 mg | ORAL_TABLET | Freq: Four times a day (QID) | ORAL | Status: DC | PRN

## 2024-02-22 MED ORDER — SODIUM CHLORIDE 0.9 % IV SOLN
2.0000 g | INTRAVENOUS | Status: DC
  Administered 2024-02-22: 2 g via INTRAVENOUS
  Filled 2024-02-22: qty 20

## 2024-02-22 MED ORDER — ALLOPURINOL 300 MG PO TABS
150.0000 mg | ORAL_TABLET | Freq: Every day | ORAL | Status: DC
Start: 2024-02-22 — End: 2024-02-27
  Administered 2024-02-22 – 2024-02-25 (×4): 150 mg via ORAL
  Filled 2024-02-22: qty 1
  Filled 2024-02-22: qty 0.5
  Filled 2024-02-22: qty 1
  Filled 2024-02-22 (×3): qty 2

## 2024-02-22 MED ORDER — ANASTROZOLE 1 MG PO TABS
1.0000 mg | ORAL_TABLET | Freq: Every day | ORAL | Status: DC
Start: 2024-02-22 — End: 2024-02-23
  Administered 2024-02-22 – 2024-02-23 (×2): 1 mg via ORAL
  Filled 2024-02-22 (×3): qty 1

## 2024-02-22 MED ORDER — SODIUM CHLORIDE 0.9 % IV SOLN
12.5000 mg | Freq: Four times a day (QID) | INTRAVENOUS | Status: DC | PRN
  Administered 2024-02-22: 12.5 mg via INTRAVENOUS
  Filled 2024-02-22: qty 12.5

## 2024-02-22 MED ORDER — LEVOFLOXACIN IN D5W 750 MG/150ML IV SOLN
750.0000 mg | Freq: Once | INTRAVENOUS | Status: AC
  Administered 2024-02-22: 750 mg via INTRAVENOUS
  Filled 2024-02-22: qty 150

## 2024-02-22 MED ORDER — PANTOPRAZOLE SODIUM 40 MG IV SOLR
40.0000 mg | Freq: Two times a day (BID) | INTRAVENOUS | Status: DC
  Administered 2024-02-22: 40 mg via INTRAVENOUS
  Filled 2024-02-22 (×2): qty 10

## 2024-02-22 MED ORDER — PANTOPRAZOLE SODIUM 40 MG IV SOLR
40.0000 mg | Freq: Two times a day (BID) | INTRAVENOUS | Status: DC
  Administered 2024-02-22 – 2024-02-27 (×10): 40 mg via INTRAVENOUS
  Filled 2024-02-22 (×10): qty 10

## 2024-02-22 MED ORDER — OXYCODONE HCL 5 MG PO TABS
5.0000 mg | ORAL_TABLET | ORAL | Status: DC | PRN
  Administered 2024-02-22 – 2024-02-23 (×4): 5 mg via ORAL
  Filled 2024-02-22 (×4): qty 1

## 2024-02-22 NOTE — ED Triage Notes (Signed)
 Pt arrived via [facility] from [facility] facility where pt had onion rings for dinner last night and felt sick after eating. Pt woke 1 hour prior to ED arrival and had 1 episode of emesis in which staff stated had blood in it. When staff asked to describe, it was not bright red and was not dark coffee ground. No emesis observed by EMS in route or on arrival to ED.

## 2024-02-22 NOTE — ED Notes (Signed)
 Pt began to de sat at 87-89%. This RN placed pt on 2L of O2. EDP made aware.

## 2024-02-22 NOTE — ED Provider Notes (Signed)
 [facility] Provider Note    Event Date/Time   First MD Initiated Contact with Patient 02/22/24 [phone]     (approximate)   History   Emesis   HPI Tina Curtis is a 89 y.o. female who presents by EMS from [facility] where she is a resident.  She came because of an episode of abdominal pain.  She says she feels poorly overall, just rundown with no energy, but also has persistent nausea.  She only vomited once.  The staff initially said that they thought had blood in it, then they said it was coffee-ground, and then someone else told paramedics it was not either of those things.  The patient is not sure.  She said that she only feels nauseated now if I move around.  She denies any ongoing abdominal pain.  She changes her story little bit about whether or not she had pain to begin with.  She denies fever, sore throat, chest pain, shortness of breath, and dysuria.     Physical Exam   Triage Vital Signs: ED Triage Vitals  Encounter Vitals Group     BP 02/22/24 0426 (!) 155/107     Girls Systolic BP Percentile --      Girls Diastolic BP Percentile --      Boys Systolic BP Percentile --      Boys Diastolic BP Percentile --      Pulse Rate 02/22/24 0426 80     Resp 02/22/24 0426 13     Temp 02/22/24 0426 98.9 F (37.2 C)     Temp Source 02/22/24 0426 Oral     SpO2 02/22/24 0423 (!) 88 %     Weight 02/22/24 0427 69.4 kg (153 lb)     Height 02/22/24 0427 1.575 m (5' 2)     Head Circumference --      Peak Flow --      Pain Score 02/22/24 0424 0     Pain Loc --      Pain Education --      Exclude from Growth Chart --     Most recent vital signs: Vitals:   02/22/24 0749 02/22/24 0800  BP:  (!) 166/79  Pulse:  78  Resp:  17  Temp:    SpO2: (!) 88% 97%    General: Awake, alert, confabulates with some of her answers but generally is pleasant and conversant and answers my questions. CV:  Good peripheral perfusion.  Resp:  Normal effort. Speaking  easily and comfortably, no accessory muscle usage nor intercostal retractions.  Lungs are clear to auscultation bilaterally. Abd:  No distention.  Mild tenderness to palpation throughout the abdomen with no localized tenderness and no guarding.  Nonperitoneal exam.   ED Results / Procedures / Treatments   Labs (all labs ordered are listed, but only abnormal results are displayed) Labs Reviewed  CBC WITH DIFFERENTIAL/PLATELET - Abnormal; Notable for the following components:      Result Value   WBC 13.2 (*)    RBC 3.08 (*)    Hemoglobin 9.7 (*)    HCT 29.7 (*)    RDW 16.2 (*)    Neutro Abs 9.8 (*)    Monocytes Absolute 1.4 (*)    Abs Immature Granulocytes 0.16 (*)    All other components within normal limits  COMPREHENSIVE METABOLIC PANEL WITH GFR - Abnormal; Notable for the following components:   Potassium 3.3 (*)    Glucose, Bld 191 (*)  BUN 45 (*)    Creatinine, Ser 2.24 (*)    GFR, Estimated 20 (*)    All other components within normal limits  URINALYSIS, ROUTINE W REFLEX MICROSCOPIC - Abnormal; Notable for the following components:   Color, Urine YELLOW (*)    APPearance CLEAR (*)    Protein, ur 100 (*)    All other components within normal limits  RESP PANEL BY RT-PCR (RSV, FLU A&B, COVID)  RVPGX2  CULTURE, BLOOD (ROUTINE X 2)  CULTURE, BLOOD (ROUTINE X 2)  LIPASE, BLOOD  LACTIC ACID, PLASMA  PROTIME-INR     EKG  ED ECG REPORT I, Darleene Dome, the attending physician, personally viewed and interpreted this ECG.  Date: 02/22/2024 EKG Time: 4:26 AM Rate: 82 Rhythm: Atrial fibrillation QRS Axis: normal Intervals: normal ST/T Wave abnormalities: Non-specific ST segment / T-wave changes, but no clear evidence of acute ischemia. Narrative Interpretation: no definitive evidence of acute ischemia; does not meet STEMI criteria.    RADIOLOGY See ED course for details   PROCEDURES:  Critical Care performed: No  Procedures    IMPRESSION / MDM /  ASSESSMENT AND PLAN / ED COURSE  I reviewed the triage vital signs and the nursing notes.                              Differential diagnosis includes, but is not limited to, viral illness, acid reflux, SBO/ileus, diverticulitis, pancreatitis, biliary colic, appendicitis, less likely GI bleed  Patient's presentation is most consistent with acute presentation with potential threat to life or bodily function.  Labs/studies ordered: CT abdomen pelvis without contrast, 1 view chest x-ray, CBC with differential, CMP, lipase, pro time-INR, urinalysis, blood cultures x 2, respiratory viral panel  Interventions/Medications given:  Medications  levofloxacin  (LEVAQUIN ) IVPB 750 mg (750 mg Intravenous New Bag/Given 02/22/24 0837)  ondansetron  (ZOFRAN ) injection 4 mg (4 mg Intravenous Given 02/22/24 0506)  lactated ringers  bolus 1,000 mL (1,000 mLs Intravenous New Bag/Given 02/22/24 0811)  acetaminophen  (TYLENOL ) tablet 650 mg (650 mg Oral Given 02/22/24 0825)    (Note:  hospital course my include additional interventions and/or labs/studies not listed above.)   Initially the patient was afebrile and not tachycardic.  She had nausea and general malaise but without any other specific symptoms, although she did have an episode of satting 88% although I did not witness this and I cannot verify that it was a good waveform.  She has no respiratory complaints at this time.  Labs are notable for a mild leukocytosis of 13.2, stable chronic kidney disease with creatinine of 2.24.  I decided to proceed with a CT of the abdomen and pelvis without contrast to further assess to rule out any acute intra-abdominal abnormalities.  However, while the patient was receiving an in and out catheterization for urine, the nurse also checked a rectal temperature and found that her temperature was 101.3.  Given this information I added on a lactic acid and blood cultures.  CT scan and chest x-ray are  pending.     Clinical Course as of 02/22/24 9076  Tue Feb 22, 2024  0720 Transferring ED care to Dr. Floy to follow up on CT scan and reassess.  Anticipate admission given SIRS criteria [CF]    Clinical Course User Index [CF] Dome Darleene, MD     FINAL CLINICAL IMPRESSION(S) / ED DIAGNOSES   Fever Generalized Abdominal Pain Vomiting   Rx / DC Orders  ED Discharge Orders     None        Note:  This document was prepared using Dragon voice recognition software and may include unintentional dictation errors.   Gordan Huxley, MD 02/22/24 [phone]

## 2024-02-22 NOTE — H&P (Addendum)
 History and Physical    Tina Curtis FMW:[id number] [name]: November 10, 1926 [name]: 02/22/2024  DOS: the patient was seen and examined on 02/22/2024  PCP: Abdul Fine, MD   Patient coming from: SNF  I have personally briefly reviewed patient's old medical records in [facility]  Chief Complaint: Vomiting today  HPI: Tina Curtis is a pleasant 89 y.o. female with medical history significant for HTN, depression/anxiety, HFpEF, history of stroke on dual antiplatelet therapy, CAD s/p MI, arthritis, gout, neuropathy, seasonal allergies who came into ED from [facility] skilled nursing facility for 1 episode of vomiting when she woke up this morning.  Facility stated that patient had onion rings last night for dinner and felt sick after eating.  Staff stated that patient had a coffee-ground emesis.  It was not dark blood.  EMS did not have any vomiting.  Patient's niece was at bedside but was not aware of what happened at the nursing home.  ED Course: Upon arrival to the ED, patient is found to be hypoxemic at 88% on room air, requiring oxygen, had a leukocytosis, potassium of 3.3, creatinine 2.24 almost around baseline, COVID flu RSV negative, bilateral pulmonary opacities but improved from prior, sigmoid diverticula with wall thickening but no perforation or abscess.  Blood cultures were sent.  Patient was given levofloxacin  due to penicillin allergy.  Patient received 1 L of IV fluid.  Hospitalist service was consulted for evaluation for admission for possible community-acquired pneumonia.  Review of Systems:  ROS  All other systems negative except as noted in the HPI.  Past Medical History:  Diagnosis Date   Arthritis    Gout   Chronic kidney disease    Diarrhea    Hypercholesteremia    Hypertension    Neuropathy    feet and lower legs   Seasonal allergies    Skin cancer    face    Past Surgical History:  Procedure Laterality Date   ABDOMINAL HYSTERECTOMY  2000   APPENDECTOMY      BREAST BIOPSY Right 09/19/2019   affirm bx of calcs UOQ, x marker, path pending   BREAST BIOPSY Right 09/19/2019   us  bx of mass,heart marker, path pending   BREAST BIOPSY Right 09/19/2019   us  bx of LN, coil marker, path pending   CATARACT EXTRACTION Left    CATARACT EXTRACTION W/PHACO Right 10/24/2014   Procedure: CATARACT EXTRACTION PHACO AND INTRAOCULAR LENS PLACEMENT (IOC);  Surgeon: Dene Etienne, MD;  Location: [facility];  Service: Ophthalmology;  Laterality: Right;   ESOPHAGOGASTRODUODENOSCOPY N/A 03/07/2018   Procedure: ESOPHAGOGASTRODUODENOSCOPY (EGD);  Surgeon: Unk Corinn Skiff, MD;  Location: [facility];  Service: Gastroenterology;  Laterality: N/A;   MASTECTOMY MODIFIED RADICAL Right 10/13/2019   Procedure: MASTECTOMY MODIFIED RADICAL;  Surgeon: Dessa Reyes ORN, MD;  Location: [facility] ORS;  Service: General;  Laterality: Right;   RENAL ANGIOGRAPHY Right 04/11/2018   Procedure: RENAL ANGIOGRAPHY;  Surgeon: Marea Selinda RAMAN, MD;  Location: [facility] INVASIVE CV LAB;  Service: Cardiovascular;  Laterality: Right;   SIMPLE MASTECTOMY WITH AXILLARY SENTINEL NODE BIOPSY Left 10/13/2019   Procedure: SIMPLE MASTECTOMY TRUE CUT BIOPSY, SENTINEL NODE BIOPSY;  Surgeon: Dessa Reyes ORN, MD;  Location: [facility] ORS;  Service: General;  Laterality: Left;     reports that she has never smoked. She has never used smokeless tobacco. She reports that she does not drink alcohol and does not use drugs.  Allergies  Allergen Reactions   Penicillins Anaphylaxis    Patient tolerates  ceftriaxone    Empagliflozin  Rash    Family History  Problem Relation Age of Onset   Hypertension Mother    Hypertension Father    Stroke Father    Breast cancer Daughter 82    Prior to Admission medications   Medication Sig Start Date End Date Taking? Authorizing Provider  acetaminophen  (TYLENOL ) 500 MG tablet Take 1,000 mg by mouth 3 (three) times daily.    [provider]  allopurinol   (ZYLOPRIM ) 100 MG tablet Take 150 mg by mouth daily.    [provider]  Alpha-D-Galactosidase (GAS-X PREVENTION PO) Take 1 capsule by mouth every 8 (eight) hours as needed.    [provider]  amLODipine  (NORVASC ) 10 MG tablet Take 10 mg by mouth at bedtime.    [provider]  anastrozole  (ARIMIDEX ) 1 MG tablet Take 1 tablet (1 mg total) by mouth daily. 08/27/23   Brahmanday, Govinda R, MD  bismuth subsalicylate (PEPTO BISMOL) 262 MG/15ML suspension Take 30 mLs by mouth every 4 (four) hours as needed.    [provider]  calcium  carbonate (OSCAL) 1500 (600 Ca) MG TABS tablet Take 1 tablet by mouth 2 (two) times daily with a meal.    [provider]  cholecalciferol  (VITAMIN D3) 25 MCG (1000 UNIT) tablet Take 1,000 Units by mouth daily.    [provider]  cyanocobalamin  (VITAMIN B12) 1000 MCG tablet Take 1,000 mcg by mouth daily.    [provider]  diclofenac  Sodium (VOLTAREN ) 1 % GEL Apply 2 g topically 2 (two) times daily. To bilateral legs.    [provider]  fluticasone (FLONASE) 50 MCG/ACT nasal spray Place 1 spray into both nostrils daily. 10/26/23   [provider]  furosemide  (LASIX ) 40 MG tablet Take 0.5 tablets (20 mg total) by mouth daily. Increase to 1 tablet (40 mg total) by mouth daily (total daily dose 40 mg) as needed for up to 3 days for increased leg swelling, shortness of breath, weight gain 5+ lbs over 1-2 days. Seek medical advice if these symptoms are not improving with increased dose. 07/03/23 07/02/24  Alexander, Natalie, DO  guaifenesin  (ROBITUSSIN) 100 MG/5ML syrup Take 200 mg by mouth every 4 (four) hours as needed for cough.    [provider]  hydrALAZINE  (APRESOLINE ) 50 MG tablet Take 1 tablet (50 mg total) by mouth 3 (three) times daily. Skip the dose if SBP less than 130 mmHg 04/02/23 04/01/24  Von Bellis, MD  ketoconazole (NIZORAL) 2 % shampoo Apply topically. 12/17/23   [provider]  loperamide (IMODIUM) 2 MG capsule Take 2 mg by mouth every 8 (eight) hours as needed for diarrhea or loose stools.    [provider]  loratadine  (CLARITIN ) 10 MG tablet Take 10 mg by mouth daily as needed for allergies. AM    [provider]  LORazepam  (ATIVAN ) 0.5 MG tablet Take 0.5 tablets (0.25 mg total) by mouth daily. 12/07/23   Caro Harlene POUR, NP  LORazepam  (ATIVAN ) 0.5 MG tablet Give 0.25mg  by mouth 24 hours as needed    [provider]  magnesium  sulfate (EPSOM SALT) GRAN One application twice daily for warm epsom salt soaks second toe left foot    [provider]  melatonin 3 MG TABS tablet Take 3 mg by mouth at bedtime.    [provider]  metoprolol  succinate (TOPROL -XL) 25 MG 24 hr tablet Take 1 tablet (25 mg total) by mouth daily. 05/11/23 05/10/24  Von Bellis, MD  OXYGEN  Inhale into the lungs. 2lpm Patient not taking: Reported on 01/19/2024    [provider]  polyethylene glycol (MIRALAX  / GLYCOLAX ) 17 g packet Take 17 g by mouth daily. Skip the dose if no constipation 05/11/23   Von Bellis, MD  pregabalin  (LYRICA ) 50 MG capsule Take 1 capsule (50 mg total) by mouth at bedtime. 01/25/24   Eubanks, Jessica K, NP  sertraline (ZOLOFT) 100 MG tablet Take 100 mg by mouth daily.    [provider]  sodium bicarbonate  650 MG tablet Take 650 mg by mouth every 8 (eight) hours as needed for heartburn.    [provider]  VAGINAL LUBRICANT VA Place 1 Application vaginally every 12 (twelve) hours as needed.    [provider]  valsartan (DIOVAN) 160 MG tablet Take 160 mg by mouth daily.    [provider]  Wheat Dextrin (BENEFIBER ON THE GO) PACK Take 1 packet by mouth daily as needed.    [provider]    Physical Exam: Vitals:   02/22/24 1049 02/22/24 1200 02/22/24 1230 02/22/24 1300  BP:  (!) 146/66 (!) 149/61 (!) 150/64  Pulse:  68 66 67  Resp:  17 16 17   Temp:  99.4 F (37.4 C)     TempSrc: Oral     SpO2:  97% 97% 95%  Weight:      Height:        Physical Exam   Constitutional: Alert, awake, calm, comfortable HEENT: Neck supple Respiratory: Bilateral transmitted sounds, decreased air entry no wheezes no rales no rhonchi Cardiovascular: Regular rate and rhythm, no murmurs / rubs / gallops. No extremity edema. 2+ pedal pulses. No carotid bruits.  Abdomen: Soft, no tenderness, Bowel sounds positive.  Musculoskeletal: no clubbing / cyanosis. Good ROM, no contractures. Normal muscle tone.  Skin: no rashes, lesions, ulcers. Neurologic: CN 2-12 grossly intact. Sensation intact, No focal deficit identified Psychiatric: Alert and oriented x 3. Normal mood.    Labs on Admission: I have personally reviewed following labs and imaging studies  CBC: Recent Labs  Lab 02/22/24 0432  WBC 13.2*  NEUTROABS 9.8*  HGB 9.7*  HCT 29.7*  MCV 96.4  PLT 170   Basic Metabolic Panel: Recent Labs  Lab 02/22/24 0522  NA 141  K 3.3*  CL 110  CO2 22  GLUCOSE 191*  BUN 45*  CREATININE 2.24*  CALCIUM  9.8   GFR: Estimated Creatinine Clearance: 14 mL/min (A) (by C-G formula based on SCr of 2.24 mg/dL (H)). Liver Function Tests: Recent Labs  Lab 02/22/24 0522  AST 21  ALT 13  ALKPHOS 79  BILITOT 0.5  PROT 7.5  ALBUMIN  3.9   Recent Labs  Lab 02/22/24 0432  LIPASE 50   No results for input(s): AMMONIA in the last 168 hours. Coagulation Profile: Recent Labs  Lab 02/22/24 0432  INR 1.0   Cardiac Enzymes: No results for input(s): CKTOTAL, CKMB, CKMBINDEX, TROPONINI, TROPONINIHS in the last 168 hours. BNP (last 3 results) Recent Labs    06/29/23 0823  BNP 680.0*   HbA1C: No results for input(s): HGBA1C in the last 72 hours. CBG: No results for input(s): GLUCAP in the last 168 hours. Lipid Profile: No results for input(s): CHOL, HDL, LDLCALC, TRIG, CHOLHDL, LDLDIRECT in the last 72 hours. Thyroid  Function  Tests: No results for input(s): TSH, T4TOTAL, FREET4, T3FREE, THYROIDAB in the last 72 hours. Anemia Panel: No results for input(s): VITAMINB12, FOLATE, FERRITIN, TIBC, IRON, RETICCTPCT in the last 72 hours. Urine  analysis:    Component Value Date/Time   COLORURINE YELLOW (A) 02/22/2024 0649   APPEARANCEUR CLEAR (A) 02/22/2024 0649   LABSPEC 1.015 02/22/2024 0649   PHURINE 5.0 02/22/2024 0649   GLUCOSEU NEGATIVE 02/22/2024 0649   HGBUR NEGATIVE 02/22/2024 0649   BILIRUBINUR NEGATIVE 02/22/2024 0649   BILIRUBINUR negative 03/18/2022 1614   KETONESUR NEGATIVE 02/22/2024 0649   PROTEINUR 100 (A) 02/22/2024 0649   UROBILINOGEN 0.2 03/18/2022 1614   NITRITE NEGATIVE 02/22/2024 0649   LEUKOCYTESUR NEGATIVE 02/22/2024 0649    Radiological Exams on Admission: I have personally reviewed images CT ABDOMEN PELVIS WO CONTRAST Result Date: 02/22/2024 EXAM: CT ABDOMEN AND PELVIS WITHOUT CONTRAST 02/22/2024 07:19:00 AM TECHNIQUE: CT of the abdomen and pelvis was performed without the administration of intravenous contrast. Multiplanar reformatted images are provided for review. Automated exposure control, iterative reconstruction, and/or weight-based adjustment of the mA/kV was utilized to reduce the radiation dose to as low as reasonably achievable. COMPARISON: 08/22/2022 CLINICAL HISTORY: Acute nonlocalized abdominal pain, nausea, and vomiting. Patient had one episode of emesis with reported blood. FINDINGS: LOWER CHEST: Scattered coronary calcifications. Dependent atelectasis in the visualized lung bases. LIVER: The liver is unremarkable. GALLBLADDER AND BILE DUCTS: Gallbladder is unremarkable. No biliary ductal dilatation. SPLEEN: No acute abnormality. PANCREAS: Multiple cystic lesions in the pancreas, largest 3.1 cm in the anterior mid body (previously 2.7 cm), multiple lesions in the tail largest 2.4 cm (previously 2.6 cm) and a separate cluster in the region of the head and  uncinate process, largest 2.3 cm (previously 2.0 cm). No ductal dilatation or regional inflammatory change. ADRENAL GLANDS: No acute abnormality. KIDNEYS, URETERS AND BLADDER: Marked left renal parenchymal atrophy as before. No stones in the kidneys or ureters. No hydronephrosis. No perinephric or periureteral stranding. Urinary bladder is unremarkable. GI AND BOWEL: Stomach demonstrates no acute abnormality. Multiple sigmoid diverticula with wall thickening, minimal regional inflammatory/edematous change, no abscess. There is no bowel obstruction. PERITONEUM AND RETROPERITONEUM: No ascites. No free air. VASCULATURE: Moderate calcified aortoiliac plaque. Stent at the origin of the right renal artery as before. 3.9 cm infrarenal abdominal aortic aneurysm (previously 3.8 cm). LYMPH NODES: No lymphadenopathy. REPRODUCTIVE ORGANS: No acute abnormality. BONES AND SOFT TISSUES: Vertebral endplate spurring at multiple levels in the lower thoracic spine. Multilevel spondylitic change in the lower lumbar spine with grade 1 anterolisthesis L5-S1 presumably related to bilateral facet degenerative joint disease (DJD). Osteitis pubis. No acute osseous abnormality. No focal soft tissue abnormality. IMPRESSION: 1. Multiple sigmoid diverticula with wall thickening and minimal regional inflammatory/edematous change. No abscess. 2. Multiple pancreatic cystic lesions as before, with minimal increase in size. 3. 3.9 cm infrarenal abdominal aortic aneurysm, previously 3.8 cm. According to ACR 2013 guidelines, the recommendation is 2-year surveillance interval. Electronically signed by: Katheleen Faes MD 02/22/2024 08:03 AM EDT RP Workstation: [id number]   DG Chest Port 1 View Result Date: 02/22/2024 EXAM: 1 VIEW(S) XRAY OF THE CHEST 02/22/2024 07:32:12 AM COMPARISON: Radiograph chest dated 06/29/2023. CLINICAL HISTORY: Questionable sepsis - evaluate for abnormality. Patient reports feeling sick after eating onion rings last night,  with 1 episode of emesis 1 hour prior to ED arrival, described as having blood (not bright red or coffee ground). FINDINGS: LUNGS AND PLEURA: Mildly prominent hazy and reticular pulmonary opacities, improved relative to the prior study. Interval resolution of right-sided pleural effusion. No pulmonary edema. No pneumothorax. HEART AND MEDIASTINUM: The heart is mildly enlarged. No acute abnormality of the mediastinal silhouette. BONES AND SOFT TISSUES: Surgical clips are noted in  the right axilla. No acute osseous abnormality. IMPRESSION: 1. Mildly prominent hazy and reticular pulmonary opacities, improved from prior. 2. Interval resolution of right pleural effusion. 3. Mild cardiomegaly. Electronically signed by: Evalene Coho MD 02/22/2024 07:37 AM EDT RP Workstation: [id number]    EKG: My personal interpretation of EKG shows:     Assessment/Plan Principal Problem:   CAP (community acquired pneumonia) Active Problems:   Depression   Type II diabetes mellitus with renal manifestations (HCC)   Gout   History of breast cancer   (HFpEF) heart failure with preserved ejection fraction (HCC)    Assessment and Plan: 89 year old female nursing home resident with multiple medical problems including but not limited to diabetes, HTN, gout, history of breast cancer, CHF, anxiety/depression who was brought into ED for 1 episode of vomiting.  It appears that she might have aspiration/GERD.  Chest x-ray showed possible infiltrate.  1.  Acute hypoxemic respiratory failure in the setting of history of congestive heart failure and possible pneumonia - She will be admitted to the hospital as inpatient for new onset of hypoxemia - She will be given oxygen to maintain saturation more than 90% - Continue supportive care - Continue underlying conditions treatment.  2.  Possible community-acquired pneumonia - Chest x-ray showed possible new infiltrate versus improving old infiltrate - Given her leukocytosis,  new vomiting and signs of sepsis, she will be treated for possible community-acquired pneumonia with ceftriaxone  and azithromycin .  Patient received levofloxacin  in the emergency room. - Follow-up cultures - Continue oxygen to maintain saturation more than 90%  3.  History of congestive heart failure - Patient was on Lasix  at the nursing home. - It will be changed to IV.  Patient has a CKD stage III-IV - Will continue to monitor kidney function.  4.  CKD stage IV GFR is around 20 - GFR is elevated less than 30 for the last few months. - Continue to monitor kidney function.  5.  Type 2 diabetes - I do not see any medications listed in at the nursing home but blood sugars are high. - She will be given insulin  sliding scale without bedtime insulin . - Continue to monitor blood sugars  6.  Gout - Continue allopurinol   7.  Hypokalemia - Replace potassium and check in the morning  8.  Concern for diverticulitis - Patient does not have any abdominal pain. - Patient will is getting ceftriaxone  and azithromycin , I hope it will cover if she has any diverticulitis.  9. GERD - She will be given Protonix  while she is in the hospital.  She had a concern for coffee-ground emesis    DVT prophylaxis: Heparin  subcu Code Status: DNR/DNI Family Communication: Patient's niece from dialysis her name is Licensed conveyancer Disposition Plan: Back to skilled nursing facility Consults called: None Admission status: Inpatient, Telemetry bed   Nena Rebel, MD [facility] 02/22/2024, 2:01 PM

## 2024-02-22 NOTE — ED Notes (Signed)
 Pt c/o nausea and 8/10 back pain. Prn meds administered

## 2024-02-22 NOTE — ED Notes (Signed)
Pt 88% on RA, placed on 2L [place].

## 2024-02-22 NOTE — Progress Notes (Signed)
 Called [facility] facility spoke to nurse. Patient stands and pivot with a walker and walks short distance in hallways with therapy at facility. Pt is HOH. Usually wears hearing aids (hearing aids are currently with family member). Takes meds whole with applesauce. Feeds self. Requires min. Assist with ADL's.

## 2024-02-22 NOTE — ED Notes (Signed)
 Pt repositioned in bed.

## 2024-02-23 ENCOUNTER — Ambulatory Visit: Admitting: Podiatry

## 2024-02-23 ENCOUNTER — Inpatient Hospital Stay

## 2024-02-23 DIAGNOSIS — K5792 Diverticulitis of intestine, part unspecified, without perforation or abscess without bleeding: Principal | ICD-10-CM

## 2024-02-23 DIAGNOSIS — I2510 Atherosclerotic heart disease of native coronary artery without angina pectoris: Secondary | ICD-10-CM | POA: Insufficient documentation

## 2024-02-23 DIAGNOSIS — J189 Pneumonia, unspecified organism: Secondary | ICD-10-CM | POA: Diagnosis not present

## 2024-02-23 LAB — RESPIRATORY PANEL BY PCR

## 2024-02-23 LAB — GLUCOSE, CAPILLARY
Glucose-Capillary: 96 mg/dL (ref 70–99)
Glucose-Capillary: 112 mg/dL — ABNORMAL HIGH (ref 70–99)
Glucose-Capillary: 90 mg/dL (ref 70–99)
Glucose-Capillary: 114 mg/dL — ABNORMAL HIGH (ref 70–99)

## 2024-02-23 LAB — COMPREHENSIVE METABOLIC PANEL WITH GFR
ALT: 9 U/L (ref 0–44)
AST: 16 U/L (ref 15–41)
Albumin: 3.4 g/dL — ABNORMAL LOW (ref 3.5–5.0)
Alkaline Phosphatase: 47 U/L (ref 38–126)
Anion gap: 10 (ref 5–15)
BUN: 37 mg/dL — ABNORMAL HIGH (ref 8–23)
CO2: 22 mmol/L (ref 22–32)
Calcium: 9.2 mg/dL (ref 8.9–10.3)
Chloride: 111 mmol/L (ref 98–111)
Creatinine, Ser: 2.06 mg/dL — ABNORMAL HIGH (ref 0.44–1.00)
GFR, Estimated: 22 mL/min — ABNORMAL LOW (ref >60)
Glucose, Bld: 91 mg/dL (ref 70–99)
Potassium: 4 mmol/L (ref 3.5–5.1)
Sodium: 143 mmol/L (ref 135–145)
Total Bilirubin: 0.4 mg/dL (ref 0.0–1.2)
Total Protein: 6.5 g/dL (ref 6.5–8.1)

## 2024-02-23 LAB — CBC
HCT: 27.5 % — ABNORMAL LOW (ref 36.0–46.0)
Hemoglobin: 8.8 g/dL — ABNORMAL LOW (ref 12.0–15.0)
MCH: 31.3 pg (ref 26.0–34.0)
MCHC: 32 g/dL (ref 30.0–36.0)
MCV: 97.9 fL (ref 80.0–100.0)
Platelets: 152 K/uL (ref 150–400)
RBC: 2.81 MIL/uL — ABNORMAL LOW (ref 3.87–5.11)
RDW: 16.4 % — ABNORMAL HIGH (ref 11.5–15.5)
WBC: 10.1 K/uL (ref 4.0–10.5)
nRBC: 0 % (ref 0.0–0.2)

## 2024-02-23 LAB — PROTIME-INR
INR: 1.1 (ref 0.8–1.2)
Prothrombin Time: 15.3 s — ABNORMAL HIGH (ref 11.4–15.2)

## 2024-02-23 LAB — STREP PNEUMONIAE URINARY ANTIGEN: Strep Pneumo Urinary Antigen: NEGATIVE

## 2024-02-23 MED ORDER — METRONIDAZOLE 500 MG/100ML IV SOLN
500.0000 mg | Freq: Two times a day (BID) | INTRAVENOUS | Status: DC
  Administered 2024-02-23 – 2024-02-26 (×8): 500 mg via INTRAVENOUS
  Filled 2024-02-23 (×9): qty 100

## 2024-02-23 MED ORDER — SODIUM CHLORIDE 0.9 % IV SOLN
2.0000 g | INTRAVENOUS | Status: DC
  Administered 2024-02-23 – 2024-02-26 (×4): 2 g via INTRAVENOUS
  Filled 2024-02-23 (×4): qty 12.5

## 2024-02-23 MED ORDER — SERTRALINE HCL 50 MG PO TABS
100.0000 mg | ORAL_TABLET | Freq: Every day | ORAL | Status: DC
  Administered 2024-02-23 – 2024-02-25 (×3): 100 mg via ORAL
  Filled 2024-02-23 (×4): qty 2

## 2024-02-23 MED ORDER — IPRATROPIUM-ALBUTEROL 0.5-2.5 (3) MG/3ML IN SOLN
3.0000 mL | Freq: Four times a day (QID) | RESPIRATORY_TRACT | Status: DC
  Administered 2024-02-23 – 2024-02-25 (×5): 3 mL via RESPIRATORY_TRACT
  Filled 2024-02-23 (×6): qty 3

## 2024-02-23 MED ORDER — SODIUM CHLORIDE 0.9 % IV SOLN: INTRAVENOUS | Status: AC

## 2024-02-23 NOTE — Plan of Care (Signed)
  Problem: Clinical Measurements: Goal: Diagnostic test results will improve Outcome: Progressing   Problem: Nutrition: Goal: Adequate nutrition will be maintained Outcome: Progressing   Problem: Pain Managment: Goal: General experience of comfort will improve and/or be controlled Outcome: Progressing   Problem: Skin Integrity: Goal: Risk for impaired skin integrity will decrease Outcome: Progressing   Problem: Activity: Goal: Ability to tolerate increased activity will improve Outcome: Progressing   Problem: Respiratory: Goal: Ability to maintain adequate ventilation will improve Outcome: Progressing Goal: Ability to maintain a clear airway will improve Outcome: Progressing

## 2024-02-23 NOTE — Progress Notes (Signed)
 PROGRESS NOTE    Tina Curtis  FMW:[id number] [name]: 08-21-1926 [name]: 02/22/2024 PCP: Abdul Fine, MD    Brief Narrative:   From admission h and p  Tina Curtis is a pleasant 89 y.o. female with medical history significant for HTN, depression/anxiety, HFpEF, history of stroke on dual antiplatelet therapy, CAD s/p MI, arthritis, gout, neuropathy, seasonal allergies who came into ED from [facility] skilled nursing facility for 1 episode of vomiting when she woke up this morning.  Facility stated that patient had onion rings last night for dinner and felt sick after eating.  Staff stated that patient had a coffee-ground emesis.  It was not dark blood.  EMS did not have any vomiting.  Patient's niece was at bedside but was not aware of what happened at the nursing home.   Assessment & Plan:   Principal Problem:   CAP (community acquired pneumonia) Active Problems:   Depression   Type II diabetes mellitus with renal manifestations (HCC)   Gout   Renovascular hypertension   Breast cancer (HCC)   History of breast cancer   Emphysema lung (HCC)   Chronic kidney disease, stage IV (severe) (HCC)   (HFpEF) heart failure with preserved ejection fraction (HCC)   Coronary artery disease  # Diverticulitis? CT with signs possible diverticulitis, no complications. Today endorses nausea and abdominal pain. One episode emesis prior to arrival. No diarrhea. Is febrile - npo - IV fluids - switch abx to cefepime/flagyl - follow cultures  # CAP? One episode vomiting prior to arrival concerning for aspiration. Denies cough but is sob though this appears to be chronic. Cxr with more chronic findings. Covid/flu/rsv neg - cefepime/flagyl as above, would cover for cap/aspiration - rvp  # Hematemesis? Report of one episode emesis prior to arrival, reportedly to be coffee grounds. No recurrence. Baseline hgb 9-10, 8.8 today, no melena or stooling. - continue IV PPI - monitor closely - trend  hgb - stop heparin   # T2DM Euglycemic here - ssi  # History breast cancer S/p mastectomy, currently on anastrozole  - can resume anastrozole  at d/c  # CKD4 # Renal artery stenosis. Kidney function at baseline. Prior renal artery stent - monitor  # Hypoxic respiratory failure # COPD On 2 liters, not on this at baseline, with some dyspnea. Not wheezing - [place] O2, wean as able  # GAD - home sertraline  # Gout - home alloprinol  # HTN Controlled - home amlodipine , hydralazine , valsartan, metoprolol   # Neuropathy - home lyrica   # HFrEF Does not appear to be exacerbated - hold lasix  while receiving IV fluids  # Goals of care For now wants to treat the treatable, has had decline last several months. Prognosis is guarded.   DVT prophylaxis: SCDs Code Status: dnr/dni, confirmed with patient and niece at bedside Family Communication: niece at bedside 10/15  Level of care: Telemetry Medical Status is: Inpatient Remains inpatient appropriate because: severity of illness    Consultants:  none  Procedures: none  Antimicrobials:  See above    Subjective: Reports some nausea, abd pain  Objective: Vitals:   02/22/24 2002 02/23/24 0056 02/23/24 0417 02/23/24 0805  BP: (!) 192/79 139/70 (!) 143/66 (!) 145/64  Pulse: 84 66 67 67  Resp: 20 16 20 18   Temp: 99.4 F (37.4 C) 99.5 F (37.5 C) 98.6 F (37 C) 98.4 F (36.9 C)  TempSrc: Oral     SpO2: 90% 93% 98% 96%  Weight:      Height:  Intake/Output Summary (Last 24 hours) at 02/23/2024 1141 Last data filed at 02/23/2024 1015 Gross per 24 hour  Intake 60 ml  Output --  Net 60 ml   Filed Weights   02/22/24 0427  Weight: 69.4 kg    Examination:  General exam: Appears calm and comfortable  Respiratory system: Clear to auscultation. Rales at bases. Mild tachypnea Cardiovascular system: S1 & S2 heard, RRR. Distant heart sounds Gastrointestinal system: Abdomen is nondistended, soft and mildly  tender throughout Central nervous system: Alert and oriented. No focal neurological deficits. Extremities: Symmetric 5 x 5 power. No edema Skin: No rashes, lesions or ulcers Psychiatry: Judgement and insight appear normal. Mood & affect appropriate.     Data Reviewed: I have personally reviewed following labs and imaging studies  CBC: Recent Labs  Lab 02/22/24 0432 02/23/24 0530  WBC 13.2* 10.1  NEUTROABS 9.8*  --   HGB 9.7* 8.8*  HCT 29.7* 27.5*  MCV 96.4 97.9  PLT 170 152   Basic Metabolic Panel: Recent Labs  Lab 02/22/24 0522 02/23/24 0530  NA 141 143  K 3.3* 4.0  CL 110 111  CO2 22 22  GLUCOSE 191* 91  BUN 45* 37*  CREATININE 2.24* 2.06*  CALCIUM  9.8 9.2   GFR: Estimated Creatinine Clearance: 15.2 mL/min (A) (by C-G formula based on SCr of 2.06 mg/dL (H)). Liver Function Tests: Recent Labs  Lab 02/22/24 0522 02/23/24 0530  AST 21 16  ALT 13 9  ALKPHOS 79 47  BILITOT 0.5 0.4  PROT 7.5 6.5  ALBUMIN  3.9 3.4*   Recent Labs  Lab 02/22/24 0432  LIPASE 50   No results for input(s): AMMONIA in the last 168 hours. Coagulation Profile: Recent Labs  Lab 02/22/24 0432 02/23/24 0530  INR 1.0 1.1   Cardiac Enzymes: No results for input(s): CKTOTAL, CKMB, CKMBINDEX, TROPONINI in the last 168 hours. BNP (last 3 results) No results for input(s): PROBNP in the last 8760 hours. HbA1C: No results for input(s): HGBA1C in the last 72 hours. CBG: Recent Labs  Lab 02/22/24 1659 02/22/24 2026 02/23/24 0807  GLUCAP 95 132* 96   Lipid Profile: No results for input(s): CHOL, HDL, LDLCALC, TRIG, CHOLHDL, LDLDIRECT in the last 72 hours. Thyroid  Function Tests: No results for input(s): TSH, T4TOTAL, FREET4, T3FREE, THYROIDAB in the last 72 hours. Anemia Panel: No results for input(s): VITAMINB12, FOLATE, FERRITIN, TIBC, IRON, RETICCTPCT in the last 72 hours. Urine analysis:    Component Value Date/Time    COLORURINE YELLOW (A) 02/22/2024 0649   APPEARANCEUR CLEAR (A) 02/22/2024 0649   LABSPEC 1.015 02/22/2024 0649   PHURINE 5.0 02/22/2024 0649   GLUCOSEU NEGATIVE 02/22/2024 0649   HGBUR NEGATIVE 02/22/2024 0649   BILIRUBINUR NEGATIVE 02/22/2024 0649   BILIRUBINUR negative 03/18/2022 1614   KETONESUR NEGATIVE 02/22/2024 0649   PROTEINUR 100 (A) 02/22/2024 0649   UROBILINOGEN 0.2 03/18/2022 1614   NITRITE NEGATIVE 02/22/2024 0649   LEUKOCYTESUR NEGATIVE 02/22/2024 0649   Sepsis Labs: @LABRCNTIP (procalcitonin:4,lacticidven:4)  ) Recent Results (from the past 240 hours)  Blood Culture (routine x 2)     Status: None (Preliminary result)   Collection Time: 02/22/24  7:16 AM   Specimen: BLOOD  Result Value Ref Range Status   Specimen Description BLOOD BLOOD LEFT ARM  Final   Special Requests   Final    BOTTLES DRAWN AEROBIC AND ANAEROBIC Blood Culture results may not be optimal due to an inadequate volume of blood received in culture bottles   Culture  Final    NO GROWTH < 24 HOURS Performed at [facility], [address] [place], [place] [address]    Report Status PENDING  Incomplete  Blood Culture (routine x 2)     Status: None (Preliminary result)   Collection Time: 02/22/24  7:16 AM   Specimen: BLOOD  Result Value Ref Range Status   Specimen Description BLOOD BLOOD RIGHT HAND  Final   Special Requests   Final    BOTTLES DRAWN AEROBIC AND ANAEROBIC Blood Culture results may not be optimal due to an inadequate volume of blood received in culture bottles   Culture   Final    NO GROWTH < 24 HOURS Performed at [facility], [address] [place], [place] [address]    Report Status PENDING  Incomplete  Resp panel by RT-PCR (RSV, Flu A&B, Covid) Anterior Nasal Swab     Status: None   Collection Time: 02/22/24  7:16 AM   Specimen: Anterior Nasal Swab  Result Value Ref Range Status   SARS Coronavirus 2 by RT PCR NEGATIVE NEGATIVE Final    Comment:  (NOTE) SARS-CoV-2 target nucleic acids are NOT DETECTED.  The SARS-CoV-2 RNA is generally detectable in upper respiratory specimens during the acute phase of infection. The lowest concentration of SARS-CoV-2 viral copies this assay can detect is 138 copies/mL. A negative result does not preclude SARS-Cov-2 infection and should not be used as the sole basis for treatment or other patient management decisions. A negative result may occur with  improper specimen collection/handling, submission of specimen other than nasopharyngeal swab, presence of viral mutation(s) within the areas targeted by this assay, and inadequate number of viral copies(<138 copies/mL). A negative result must be combined with clinical observations, patient history, and epidemiological information. The expected result is Negative.  Fact Sheet for Patients:  BloggerCourse.com  Fact Sheet for Healthcare Providers:  SeriousBroker.it  This test is no t yet approved or cleared by the [place]  FDA and  has been authorized for detection and/or diagnosis of SARS-CoV-2 by FDA under an Emergency Use Authorization (EUA). This EUA will remain  in effect (meaning this test can be used) for the duration of the COVID-19 declaration under Section 564(b)(1) of the Act, 21 U.S.C.section 360bbb-3(b)(1), unless the authorization is terminated  or revoked sooner.       Influenza A by PCR NEGATIVE NEGATIVE Final   Influenza B by PCR NEGATIVE NEGATIVE Final    Comment: (NOTE) The Xpert Xpress SARS-CoV-2/FLU/RSV plus assay is intended as an aid in the diagnosis of influenza from Nasopharyngeal swab specimens and should not be used as a sole basis for treatment. Nasal washings and aspirates are unacceptable for Xpert Xpress SARS-CoV-2/FLU/RSV testing.  Fact Sheet for Patients: BloggerCourse.com  Fact Sheet for Healthcare  Providers: SeriousBroker.it  This test is not yet approved or cleared by the [place]  FDA and has been authorized for detection and/or diagnosis of SARS-CoV-2 by FDA under an Emergency Use Authorization (EUA). This EUA will remain in effect (meaning this test can be used) for the duration of the COVID-19 declaration under Section 564(b)(1) of the Act, 21 U.S.C. section 360bbb-3(b)(1), unless the authorization is terminated or revoked.     Resp Syncytial Virus by PCR NEGATIVE NEGATIVE Final    Comment: (NOTE) Fact Sheet for Patients: BloggerCourse.com  Fact Sheet for Healthcare Providers: SeriousBroker.it  This test is not yet approved or cleared by the [place]  FDA and has been authorized for detection and/or diagnosis of SARS-CoV-2  by FDA under an Emergency Use Authorization (EUA). This EUA will remain in effect (meaning this test can be used) for the duration of the COVID-19 declaration under Section 564(b)(1) of the Act, 21 U.S.C. section 360bbb-3(b)(1), unless the authorization is terminated or revoked.  Performed at [facility], [address] [place], [place] [address]          Radiology Studies: CT ABDOMEN PELVIS WO CONTRAST Result Date: 02/22/2024 EXAM: CT ABDOMEN AND PELVIS WITHOUT CONTRAST 02/22/2024 07:19:00 AM TECHNIQUE: CT of the abdomen and pelvis was performed without the administration of intravenous contrast. Multiplanar reformatted images are provided for review. Automated exposure control, iterative reconstruction, and/or weight-based adjustment of the mA/kV was utilized to reduce the radiation dose to as low as reasonably achievable. COMPARISON: 08/22/2022 CLINICAL HISTORY: Acute nonlocalized abdominal pain, nausea, and vomiting. Patient had one episode of emesis with reported blood. FINDINGS: LOWER CHEST: Scattered coronary calcifications. Dependent atelectasis  in the visualized lung bases. LIVER: The liver is unremarkable. GALLBLADDER AND BILE DUCTS: Gallbladder is unremarkable. No biliary ductal dilatation. SPLEEN: No acute abnormality. PANCREAS: Multiple cystic lesions in the pancreas, largest 3.1 cm in the anterior mid body (previously 2.7 cm), multiple lesions in the tail largest 2.4 cm (previously 2.6 cm) and a separate cluster in the region of the head and uncinate process, largest 2.3 cm (previously 2.0 cm). No ductal dilatation or regional inflammatory change. ADRENAL GLANDS: No acute abnormality. KIDNEYS, URETERS AND BLADDER: Marked left renal parenchymal atrophy as before. No stones in the kidneys or ureters. No hydronephrosis. No perinephric or periureteral stranding. Urinary bladder is unremarkable. GI AND BOWEL: Stomach demonstrates no acute abnormality. Multiple sigmoid diverticula with wall thickening, minimal regional inflammatory/edematous change, no abscess. There is no bowel obstruction. PERITONEUM AND RETROPERITONEUM: No ascites. No free air. VASCULATURE: Moderate calcified aortoiliac plaque. Stent at the origin of the right renal artery as before. 3.9 cm infrarenal abdominal aortic aneurysm (previously 3.8 cm). LYMPH NODES: No lymphadenopathy. REPRODUCTIVE ORGANS: No acute abnormality. BONES AND SOFT TISSUES: Vertebral endplate spurring at multiple levels in the lower thoracic spine. Multilevel spondylitic change in the lower lumbar spine with grade 1 anterolisthesis L5-S1 presumably related to bilateral facet degenerative joint disease (DJD). Osteitis pubis. No acute osseous abnormality. No focal soft tissue abnormality. IMPRESSION: 1. Multiple sigmoid diverticula with wall thickening and minimal regional inflammatory/edematous change. No abscess. 2. Multiple pancreatic cystic lesions as before, with minimal increase in size. 3. 3.9 cm infrarenal abdominal aortic aneurysm, previously 3.8 cm. According to ACR 2013 guidelines, the recommendation is  2-year surveillance interval. Electronically signed by: Katheleen Faes MD 02/22/2024 08:03 AM EDT RP Workstation: [id number]   DG Chest Port 1 View Result Date: 02/22/2024 EXAM: 1 VIEW(S) XRAY OF THE CHEST 02/22/2024 07:32:12 AM COMPARISON: Radiograph chest dated 06/29/2023. CLINICAL HISTORY: Questionable sepsis - evaluate for abnormality. Patient reports feeling sick after eating onion rings last night, with 1 episode of emesis 1 hour prior to ED arrival, described as having blood (not bright red or coffee ground). FINDINGS: LUNGS AND PLEURA: Mildly prominent hazy and reticular pulmonary opacities, improved relative to the prior study. Interval resolution of right-sided pleural effusion. No pulmonary edema. No pneumothorax. HEART AND MEDIASTINUM: The heart is mildly enlarged. No acute abnormality of the mediastinal silhouette. BONES AND SOFT TISSUES: Surgical clips are noted in the right axilla. No acute osseous abnormality. IMPRESSION: 1. Mildly prominent hazy and reticular pulmonary opacities, improved from prior. 2. Interval resolution of right pleural effusion. 3. Mild cardiomegaly. Electronically signed by:  Timothy Berrigan MD 02/22/2024 07:37 AM EDT RP Workstation: [id number]        Scheduled Meds:  allopurinol   150 mg Oral Daily   amLODipine   10 mg Oral QHS   anastrozole   1 mg Oral Daily   diclofenac  Sodium  2 g Topical BID   furosemide   20 mg Intravenous BID   heparin   5,000 Units Subcutaneous Q8H   hydrALAZINE   50 mg Oral TID   insulin  aspart  0-9 Units Subcutaneous TID WC   irbesartan   37.5 mg Oral Daily   melatonin  5 mg Oral QHS   metoprolol  succinate  25 mg Oral Daily   pantoprazole  (PROTONIX ) IV  40 mg Intravenous Q12H   pregabalin   50 mg Oral QHS   Continuous Infusions:  azithromycin  500 mg (02/23/24 0844)   cefTRIAXone  (ROCEPHIN )  IV 2 g (02/22/24 2150)   promethazine  (PHENERGAN ) injection (IM or IVPB) 12.5 mg (02/22/24 2111)     LOS: 1 day     Devaughn KATHEE Ban,  MD [facility]   If 7PM-7AM, please contact night-coverage [email or web address] Password [email or web address] 02/23/2024, 11:41 AM

## 2024-02-23 NOTE — Evaluation (Signed)
 Physical Therapy Evaluation Patient Details Name: Tina Curtis MRN: [id number] [name]: March 26, 1927 Today's Date: 02/23/2024  History of Present Illness  Pt is a 89 y/o F admitted on 02/22/24 after presenting with c/o N&V. CT with possible signs of diverticulitis. PMH: HTN, depression, anxiety, HFpEF, stroke on dual antiplatelet therapy, CAD s/p MI, arthritis, gout, neuropathy, seasonal allergies, breast CA, DM2, CKD4, gout, neuropathy  Clinical Impression  Pt seen for PT evaluation with pt received in bed, asleep but easily awakened & agreeable to tx. Pt reports prior to admission she was at [facility], requiring assistance for bed mobility & transfers to/from w/c with RW. At end of session, nurse notes pt was ambulating with therapy at [facility] & pt reports this as well, but reports she was no longer getting PT services 2/2 insurance. On this date, pt requires max assist for bed mobility, mod assist for sit>stand & min assist for step pivot bed>recliner with RW. Pt's O2 does drop to 86% on 2L/min but does increase to 93% with rest. Will continue to follow pt acutely to progress mobility as able.        If plan is discharge home, recommend the following: A lot of help with walking and/or transfers;A lot of help with bathing/dressing/bathroom   Can travel by private vehicle        Equipment Recommendations  (defer to next venue)  Recommendations for Other Services       Functional Status Assessment Patient has had a recent decline in their functional status and demonstrates the ability to make significant improvements in function in a reasonable and predictable amount of time.     Precautions / Restrictions Precautions Precautions: Fall Restrictions Weight Bearing Restrictions Per Provider Order: No      Mobility  Bed Mobility Overal bed mobility: Needs Assistance Bed Mobility: Supine to Sit     Supine to sit: Max assist, HOB elevated, Used rails (assistance to move BLE to  EOB, upright trunk, exit L side of bed, reports she required more assistance today than what her aide typically gives her)          Transfers Overall transfer level: Needs assistance Equipment used: Rolling walker (2 wheels) Transfers: Sit to/from Stand, Bed to chair/wheelchair/BSC Sit to Stand: Mod assist   Step pivot transfers: Min assist       General transfer comment: cuing re: wide BOS, feet underneath her, assistance to power up to standing from elevated EOB, pt with decreased awareness of safe hand placement during sit>stand. Pt transfers bed>recliner via step pivot with RW & min assist, pt voices fear of feeling like she's about to fall but reassured by pt & able to complete pivot; cuing re: need to reach back for seat prior to stand>sit.    Ambulation/Gait                  Stairs            Wheelchair Mobility     Tilt Bed    Modified Rankin (Stroke Patients Only)       Balance Overall balance assessment: Needs assistance Sitting-balance support: Feet supported Sitting balance-Leahy Scale: Fair     Standing balance support: During functional activity, Bilateral upper extremity supported, Reliant on assistive device for balance Standing balance-Leahy Scale: Poor                               Pertinent Vitals/Pain Pain  Assessment Pain Assessment: Faces Faces Pain Scale: Hurts even more Pain Location: when BLE (L>R) touched/moved, c/o chronic R shoulder pain (ongoing since fall last Christmas) Pain Descriptors / Indicators: Grimacing, Discomfort Pain Intervention(s): Monitored during session, Repositioned    Home Living Family/patient expects to be discharged to:: Skilled nursing facility                   Additional Comments: [facility]    Prior Function Prior Level of Function : Needs assist             Mobility Comments: Pt reports she required assistance for bed mobility & transfers, transferred to w/c with  RW & propelled w/c herself. At end of session nurse notes per report from [facility], pt was ambulating with PT. Pt reports she was doing this but no longer receiving PT services 2/2 insurance/covered visits had run out.       Extremity/Trunk Assessment   Upper Extremity Assessment Upper Extremity Assessment: Generalized weakness (c/o chronic R shoulder pain following fall last Christmas)    Lower Extremity Assessment Lower Extremity Assessment: Generalized weakness (c/o BLE pain (L>R))       Communication   Communication Communication: Impaired Factors Affecting Communication: Hearing impaired    Cognition Arousal: Alert Behavior During Therapy: WFL for tasks assessed/performed   PT - Cognitive impairments: No family/caregiver present to determine baseline                         Following commands: Impaired Following commands impaired: Follows one step commands with increased time     Cueing Cueing Techniques: Verbal cues     General Comments General comments (skin integrity, edema, etc.): Incontinent void upon standing; PT assisted pt with cleaning legs, doffing soiled socks. Pt on 2L/min with SpO2 as low as 86% with transfer, does increase to 93% with rest, cuing for pursed lip breathing - nurse made aware of O2.    Exercises     Assessment/Plan    PT Assessment Patient needs continued PT services  PT Problem List Pain;Decreased strength;Cardiopulmonary status limiting activity;Decreased range of motion;Decreased activity tolerance;Decreased balance;Decreased mobility;Decreased safety awareness;Decreased knowledge of use of DME       PT Treatment Interventions DME instruction;Balance training;Gait training;Neuromuscular re-education;Patient/family education;Functional mobility training;Therapeutic activities;Therapeutic exercise;Manual techniques;Wheelchair mobility training    PT Goals (Current goals can be found in the Care Plan section)  Acute Rehab  PT Goals Patient Stated Goal: get better PT Goal Formulation: With patient Time For Goal Achievement: 03/08/24 Potential to Achieve Goals: Good    Frequency Min 2X/week     Co-evaluation               AM-PAC PT 6 Clicks Mobility  Outcome Measure Help needed turning from your back to your side while in a flat bed without using bedrails?: A Lot Help needed moving from lying on your back to sitting on the side of a flat bed without using bedrails?: Total Help needed moving to and from a bed to a chair (including a wheelchair)?: A Little Help needed standing up from a chair using your arms (e.g., wheelchair or bedside chair)?: A Lot Help needed to walk in hospital room?: Total Help needed climbing 3-5 steps with a railing? : Total 6 Click Score: 10    End of Session Equipment Utilized During Treatment: Oxygen Activity Tolerance: Patient tolerated treatment well Patient left: in chair;with chair alarm set;with call bell/phone within reach Nurse  Communication: Mobility status (O2) PT Visit Diagnosis: Muscle weakness (generalized) (M62.81);Other abnormalities of gait and mobility (R26.89);Difficulty in walking, not elsewhere classified (R26.2);Unsteadiness on feet (R26.81)    Time: 8495-8478 PT Time Calculation (min) (ACUTE ONLY): 17 min   Charges:   PT Evaluation $PT Eval Low Complexity: 1 Low   PT General Charges $$ ACUTE PT VISIT: 1 Visit         Richerd Pinal, PT, DPT 02/23/24, 3:32 PM   Richerd CHRISTELLA Pinal 02/23/2024, 3:31 PM

## 2024-02-24 DIAGNOSIS — A419 Sepsis, unspecified organism: Secondary | ICD-10-CM

## 2024-02-24 DIAGNOSIS — R6520 Severe sepsis without septic shock: Secondary | ICD-10-CM | POA: Diagnosis not present

## 2024-02-24 DIAGNOSIS — K5792 Diverticulitis of intestine, part unspecified, without perforation or abscess without bleeding: Secondary | ICD-10-CM | POA: Diagnosis not present

## 2024-02-24 DIAGNOSIS — J9621 Acute and chronic respiratory failure with hypoxia: Secondary | ICD-10-CM | POA: Diagnosis not present

## 2024-02-24 LAB — GLUCOSE, CAPILLARY
Glucose-Capillary: 97 mg/dL (ref 70–99)
Glucose-Capillary: 103 mg/dL — ABNORMAL HIGH (ref 70–99)
Glucose-Capillary: 101 mg/dL — ABNORMAL HIGH (ref 70–99)
Glucose-Capillary: 112 mg/dL — ABNORMAL HIGH (ref 70–99)
Glucose-Capillary: 108 mg/dL — ABNORMAL HIGH (ref 70–99)

## 2024-02-24 LAB — LEGIONELLA PNEUMOPHILA SEROGP 1 UR AG: L. pneumophila Serogp 1 Ur Ag: NEGATIVE

## 2024-02-24 LAB — BASIC METABOLIC PANEL WITH GFR
Anion gap: 7 (ref 5–15)
BUN: 38 mg/dL — ABNORMAL HIGH (ref 8–23)
CO2: 17 mmol/L — ABNORMAL LOW (ref 22–32)
Calcium: 7.6 mg/dL — ABNORMAL LOW (ref 8.9–10.3)
Chloride: 118 mmol/L — ABNORMAL HIGH (ref 98–111)
Creatinine, Ser: 1.99 mg/dL — ABNORMAL HIGH (ref 0.44–1.00)
GFR, Estimated: 23 mL/min — ABNORMAL LOW (ref >60)
Glucose, Bld: 74 mg/dL (ref 70–99)
Potassium: 3.4 mmol/L — ABNORMAL LOW (ref 3.5–5.1)
Sodium: 142 mmol/L (ref 135–145)

## 2024-02-24 LAB — CBC
HCT: 25 % — ABNORMAL LOW (ref 36.0–46.0)
Hemoglobin: 7.8 g/dL — ABNORMAL LOW (ref 12.0–15.0)
MCH: 32 pg (ref 26.0–34.0)
MCHC: 31.2 g/dL (ref 30.0–36.0)
MCV: 102.5 fL — ABNORMAL HIGH (ref 80.0–100.0)
Platelets: 132 K/uL — ABNORMAL LOW (ref 150–400)
RBC: 2.44 MIL/uL — ABNORMAL LOW (ref 3.87–5.11)
RDW: 16.7 % — ABNORMAL HIGH (ref 11.5–15.5)
WBC: 8.8 K/uL (ref 4.0–10.5)
nRBC: 0 % (ref 0.0–0.2)

## 2024-02-24 LAB — TYPE AND SCREEN
ABO/RH(D): B POS
Antibody Screen: NEGATIVE

## 2024-02-24 LAB — MRSA NEXT GEN BY PCR, NASAL: MRSA by PCR Next Gen: NOT DETECTED

## 2024-02-24 MED ORDER — POTASSIUM CHLORIDE CRYS ER 20 MEQ PO TBCR
40.0000 meq | EXTENDED_RELEASE_TABLET | Freq: Two times a day (BID) | ORAL | Status: AC
  Administered 2024-02-24: 40 meq via ORAL
  Filled 2024-02-24: qty 2

## 2024-02-24 MED ORDER — MORPHINE SULFATE (PF) 2 MG/ML IV SOLN
1.0000 mg | INTRAVENOUS | Status: DC | PRN
  Administered 2024-02-25: 1 mg via INTRAVENOUS
  Filled 2024-02-24: qty 1

## 2024-02-24 MED ORDER — CHLORHEXIDINE GLUCONATE CLOTH 2 % EX PADS
6.0000 | MEDICATED_PAD | Freq: Every day | CUTANEOUS | Status: DC
  Administered 2024-02-24: 6 via TOPICAL

## 2024-02-24 MED ORDER — SODIUM CHLORIDE 0.9 % IV SOLN: INTRAVENOUS | Status: DC

## 2024-02-24 MED ORDER — LORAZEPAM 2 MG/ML IJ SOLN
0.5000 mg | Freq: Four times a day (QID) | INTRAMUSCULAR | Status: DC | PRN
  Administered 2024-02-24 – 2024-02-26 (×4): 0.5 mg via INTRAVENOUS
  Filled 2024-02-24 (×4): qty 1

## 2024-02-24 MED ORDER — MORPHINE SULFATE (PF) 2 MG/ML IV SOLN
1.0000 mg | Freq: Once | INTRAVENOUS | Status: AC
  Administered 2024-02-24: 1 mg via INTRAVENOUS
  Filled 2024-02-24: qty 1

## 2024-02-24 NOTE — Progress Notes (Signed)
 PROGRESS NOTE Tina Curtis  FMW:[id number] [name]: 07/09/1926 [name]: 02/22/2024 PCP: Abdul Fine, MD   Brief Narrative:  Tina Curtis is a 89 y.o. female with medical history significant for HTN, depression/anxiety, HFpEF, history of stroke on dual antiplatelet therapy, CAD s/p MI, arthritis, gout, neuropathy, seasonal allergies who came into ED from [facility] skilled nursing facility for 1 episode of vomiting when she woke up this morning described as coffee grounds.   02/24/24- no further vomiting since admission. Has significant worsening of respiratory status overnight likely related to vomiting/aspiration event and pulmonary irritation.   Assessment & Plan:   Principal Problem:   Acute diverticulitis Active Problems:   Depression   Type II diabetes mellitus with renal manifestations (HCC)   Gout   Renovascular hypertension   Breast cancer (HCC)   History of breast cancer   Emphysema lung (HCC)   Chronic kidney disease, stage IV (severe) (HCC)   CAP (community acquired pneumonia)   (HFpEF) heart failure with preserved ejection fraction (HCC)   Coronary artery disease  # Diverticulitis- CT with signs possible diverticulitis. Complaining of abdominal pain throughout encounter.  - analgesia PRN - continue cefepime/flagyl - follow cultures, NGTD  Sepsis Acute hypoxic respiratory failure  CAP- multifocal- suspect aspiration- was on 2L and overnight increased to 15Lnc. Has increased WOB and tachypnea today. Respiratory therapy transitioned to HHFNC.  She seems to have had some improvement in symptoms with morphine  for air hunger. She is confirmed DNR/DNI. Discussed with family members. She is in critical condition and if not responding to treatment, would recommend comfort care. Daughter lives in [place]  and will attempt to come to hospital. I'm concerned with her increased WOB she will become fatigued.  Covid/flu/rsv neg - cefepime/flagyl as above, would cover for  cap/aspiration - - continue npo - IV fluids - palliative consulted.  - monitor fever curve - tylenol  PRN  # Hematemesis? Report of one episode emesis prior to arrival, reportedly to be coffee grounds. No recurrence. Baseline hgb 9-10 and has gradual decline since admission. Multiple Bms since admission without evidence of acute bleed. Not seeking invasive interventions at this time. Open to transfusion if needed.  - CBC am  - continue IV PPI - monitor closely - trend hgb - stop heparin   # T2DM Euglycemic here - ssi  # History breast cancer- S/p mastectomy, on anastrozole , currently held. Has chronic R arm lymphedema. Will bring in arm sleeve.  - elevate arm  # CKD4 # Renal artery stenosis. Kidney function at baseline. Prior renal artery stent - monitor  # GAD - home sertraline  # Gout - home alloprinol  # HTN Controlled - home amlodipine , hydralazine , valsartan, metoprolol   # Neuropathy - home lyrica   # HFrEF Does not appear to be exacerbated - hold lasix  while receiving IV fluids  # Goals of care For now wants to treat the treatable, has had decline last several months. Prognosis is guarded.  DVT prophylaxis: SCDs Code Status: dnr/dni, confirmed with daughter Family Communication: niece at bedside, daughter on phone  Level of care: Telemetry Medical Status is: Inpatient Remains inpatient appropriate because: severity of illness  Consultants:  Palliative   Procedures: none  Antimicrobials:  See above    Subjective: Reports pain of abdomen.   Objective: Vitals:   02/23/24 1955 02/23/24 2236 02/23/24 2244 02/24/24 0359  BP: (!) 123/53   125/60  Pulse: 73   71  Resp:      Temp: 98.3 F (36.8 C)  98.3 F (36.8 C)  TempSrc: Oral   Oral  SpO2: 92% 90% 94% 90%  Weight:      Height:        Intake/Output Summary (Last 24 hours) at 02/24/2024 0717 Last data filed at 02/24/2024 0539 Gross per 24 hour  Intake 1750.88 ml  Output 1550 ml  Net  200.88 ml   Filed Weights   02/22/24 0427  Weight: 69.4 kg    Examination:  General exam: Appears in acute distress. lethargic Respiratory system: accessory muscle use, increased WOB. Poor perfusion to bases Cardiovascular system: S1 & S2 heard, RRR. Significant systolic murmur  Gastrointestinal system: Abdomen is nondistended, soft and mildly tender throughout Central nervous system: Alert and oriented. No focal neurological deficits. Extremities: Symmetric 5 x 5 power. No edema Skin: No rashes, lesions or ulcers Psychiatry: Judgement and insight appear normal. Mood & affect appropriate.   Data Reviewed: I have personally reviewed following labs and imaging studies  CBC: Recent Labs  Lab 02/22/24 0432 02/23/24 0530 02/24/24 0452  WBC 13.2* 10.1 8.8  NEUTROABS 9.8*  --   --   HGB 9.7* 8.8* 7.8*  HCT 29.7* 27.5* 25.0*  MCV 96.4 97.9 102.5*  PLT 170 152 132*   Basic Metabolic Panel: Recent Labs  Lab 02/22/24 0522 02/23/24 0530 02/24/24 0452  NA 141 143 142  K 3.3* 4.0 3.4*  CL 110 111 118*  CO2 22 22 17*  GLUCOSE 191* 91 74  BUN 45* 37* 38*  CREATININE 2.24* 2.06* 1.99*  CALCIUM  9.8 9.2 7.6*   GFR: Estimated Creatinine Clearance: 15.8 mL/min (A) (by C-G formula based on SCr of 1.99 mg/dL (H)). Liver Function Tests: Recent Labs  Lab 02/22/24 0522 02/23/24 0530  AST 21 16  ALT 13 9  ALKPHOS 79 47  BILITOT 0.5 0.4  PROT 7.5 6.5  ALBUMIN  3.9 3.4*   Recent Labs  Lab 02/22/24 0432  LIPASE 50    LOS: 2 days   Marien LITTIE Piety, MD [facility]   If 7PM-7AM, please contact night-coverage [email or web address] Password [email or web address] 02/24/2024, 7:17 AM

## 2024-02-24 NOTE — Plan of Care (Signed)
  Problem: Metabolic: Goal: Ability to maintain appropriate glucose levels will improve Outcome: Progressing   Problem: Nutritional: Goal: Maintenance of adequate nutrition will improve Outcome: Progressing Goal: Progress toward achieving an optimal weight will improve Outcome: Progressing   Problem: Skin Integrity: Goal: Risk for impaired skin integrity will decrease Outcome: Progressing

## 2024-02-24 NOTE — Plan of Care (Signed)

## 2024-02-24 NOTE — Progress Notes (Signed)
 PT Cancellation Note  Patient Details Name: Tina Curtis MRN: [id number] [name]: 03-13-1927   Cancelled Treatment:    Reason Eval/Treat Not Completed: Medical issues which prohibited therapy: Pt's nurse contacted PT and requested PT services be held this date with supplemental O2 now at 45L.  Will attempt to see pt at a future date/time as medically appropriate.    CHARM [name] PT, DPT 02/24/24, 3:35 PM

## 2024-02-24 NOTE — Progress Notes (Addendum)
 SLP Cancellation Note  Patient Details Name: Tina Curtis MRN: [id number] [name]: Oct 02, 1926   Cancelled treatment:       Reason Eval/Treat Not Completed: Medical issues which prohibited therapy;Patient not medically ready (chart reviewed; HOLD)   At admit, pt's Active Problems included: Acute diverticulitis, then Sepsis s/p Acute hypoxic respiratory failure  CAP / multifocal- suspect aspiration w/ Vomiting event. Pt was on 2L which increased to 15Lnc w/ further increase to 45L w/ FiO2 of 80%. WOB and tachypnea noted. RT transitioned to [facility]. MD is discussing w/ Family re: the GOC/POC.   Per chart review: 02/24/24- no further Vomiting since admission. Has significant worsening of respiratory status overnight likely related to vomiting/aspiration event and pulmonary irritation..  In setting of pt's need for increased O2 support/requirements, and increased risk for aspiration in setting of declined Pulmonary status, will HOLD on BSE and po trials today. ST services will f/u tomorrow w/ BSE if status is appropriate. Recommend frequent oral care for hygiene and stimulation of swallowing.     Comer Portugal, MS, CCC-SLP Speech Language Pathologist Rehab Services; [facility] [phone] (ascom) Tina Curtis 02/24/2024, 6:39 PM

## 2024-02-24 NOTE — Care Management Important Message (Signed)
 Important Message  Patient Details  Name: Tina Curtis MRN: [id number] Date of Birth: [birth year]   Important Message Given:  Yes - Medicare IM     Armour Villanueva W, CMA 02/24/2024, 1:13 PM

## 2024-02-25 DIAGNOSIS — J9601 Acute respiratory failure with hypoxia: Secondary | ICD-10-CM | POA: Diagnosis not present

## 2024-02-25 DIAGNOSIS — Z7189 Other specified counseling: Secondary | ICD-10-CM

## 2024-02-25 DIAGNOSIS — Z789 Other specified health status: Secondary | ICD-10-CM | POA: Diagnosis not present

## 2024-02-25 DIAGNOSIS — Z515 Encounter for palliative care: Secondary | ICD-10-CM

## 2024-02-25 DIAGNOSIS — K5792 Diverticulitis of intestine, part unspecified, without perforation or abscess without bleeding: Secondary | ICD-10-CM | POA: Diagnosis not present

## 2024-02-25 DIAGNOSIS — N184 Chronic kidney disease, stage 4 (severe): Secondary | ICD-10-CM

## 2024-02-25 DIAGNOSIS — J439 Emphysema, unspecified: Secondary | ICD-10-CM

## 2024-02-25 DIAGNOSIS — Z66 Do not resuscitate: Secondary | ICD-10-CM

## 2024-02-25 LAB — BASIC METABOLIC PANEL WITH GFR
Anion gap: 6 (ref 5–15)
BUN: 41 mg/dL — ABNORMAL HIGH (ref 8–23)
CO2: 20 mmol/L — ABNORMAL LOW (ref 22–32)
Calcium: 9 mg/dL (ref 8.9–10.3)
Chloride: 116 mmol/L — ABNORMAL HIGH (ref 98–111)
Creatinine, Ser: 2.45 mg/dL — ABNORMAL HIGH (ref 0.44–1.00)
GFR, Estimated: 18 mL/min — ABNORMAL LOW (ref >60)
Glucose, Bld: 101 mg/dL — ABNORMAL HIGH (ref 70–99)
Potassium: 4 mmol/L (ref 3.5–5.1)
Sodium: 142 mmol/L (ref 135–145)

## 2024-02-25 LAB — CBC
HCT: 26.1 % — ABNORMAL LOW (ref 36.0–46.0)
Hemoglobin: 8.5 g/dL — ABNORMAL LOW (ref 12.0–15.0)
MCH: 32.4 pg (ref 26.0–34.0)
MCHC: 32.6 g/dL (ref 30.0–36.0)
MCV: 99.6 fL (ref 80.0–100.0)
Platelets: 142 K/uL — ABNORMAL LOW (ref 150–400)
RBC: 2.62 MIL/uL — ABNORMAL LOW (ref 3.87–5.11)
RDW: 16 % — ABNORMAL HIGH (ref 11.5–15.5)
WBC: 9.2 K/uL (ref 4.0–10.5)
nRBC: 0 % (ref 0.0–0.2)

## 2024-02-25 LAB — GLUCOSE, CAPILLARY: Glucose-Capillary: 94 mg/dL (ref 70–99)

## 2024-02-25 MED ORDER — IPRATROPIUM-ALBUTEROL 0.5-2.5 (3) MG/3ML IN SOLN
3.0000 mL | Freq: Three times a day (TID) | RESPIRATORY_TRACT | Status: DC
  Administered 2024-02-25 – 2024-02-27 (×4): 3 mL via RESPIRATORY_TRACT
  Filled 2024-02-25 (×5): qty 3

## 2024-02-25 MED ORDER — HYDRALAZINE HCL 20 MG/ML IJ SOLN
2.0000 mg | Freq: Three times a day (TID) | INTRAMUSCULAR | Status: DC
  Administered 2024-02-25 – 2024-02-27 (×7): 2 mg via INTRAVENOUS
  Filled 2024-02-25 (×7): qty 1

## 2024-02-25 MED ORDER — DEXTROSE-SODIUM CHLORIDE 5-0.9 % IV SOLN: INTRAVENOUS | Status: DC

## 2024-02-25 MED ADMIN — Acetaminophen IV Soln 10 MG/ML: 1000 mg | INTRAVENOUS | NDC 63323043441

## 2024-02-25 MED FILL — Acetaminophen IV Soln 10 MG/ML: 1000.0000 mg | INTRAVENOUS | Qty: 100 | Status: AC

## 2024-02-25 NOTE — Progress Notes (Signed)
 The patient is running temp of 100.9.  She will not be able to swallow safely due to her being drowsy.  She's very comfortable right now and I'm  trying not to disturb her by  giving Tylenol  suppository.  Notified Dr. Lawence for IV Tylenol  and received a new order for it. Will administer and continue to monitor.

## 2024-02-25 NOTE — Plan of Care (Signed)
  Problem: Education: Goal: Ability to describe self-care measures that may prevent or decrease complications (Diabetes Survival Skills Education) will improve Outcome: Not Progressing   Problem: Coping: Goal: Ability to adjust to condition or change in health will improve Outcome: Progressing   Problem: Metabolic: Goal: Ability to maintain appropriate glucose levels will improve Outcome: Progressing   Problem: Nutritional: Goal: Maintenance of adequate nutrition will improve Outcome: Not Progressing   Problem: Skin Integrity: Goal: Risk for impaired skin integrity will decrease Outcome: Progressing   Problem: Tissue Perfusion: Goal: Adequacy of tissue perfusion will improve Outcome: Progressing   Problem: Clinical Measurements: Goal: Ability to maintain clinical measurements within normal limits will improve Outcome: Progressing Goal: Will remain free from infection Outcome: Progressing Goal: Respiratory complications will improve Outcome: Progressing Goal: Cardiovascular complication will be avoided Outcome: Progressing

## 2024-02-25 NOTE — Plan of Care (Signed)
 Continuing with plan of care.

## 2024-02-25 NOTE — Progress Notes (Signed)
 SLP Cancellation Note  Patient Details Name: Tina Curtis MRN: [id number] [name]: [birth year]   Cancelled treatment:       Reason Eval/Treat Not Completed: Medical issues which prohibited therapy  Spoke with MD at bedside. Instructed to cancel consult d/t declining pt condition.   [name] 02/25/2024, 9:17 AM

## 2024-02-25 NOTE — Progress Notes (Signed)
 PT Cancellation Note  Patient Details Name: ASANI DENISTON MRN: [id number] [name]: 1927-02-27   Cancelled Treatment:    Reason Eval/Treat Not Completed: Medical issues which prohibited therapy: Pt transferred to the ICU, per protocol PT orders to be completed at this time.  Will attempt to see pt at a future date/time as medically appropriate upon receipt of new PT orders.    CHARM [name] PT, DPT 02/25/24, 9:15 AM

## 2024-02-25 NOTE — Consult Note (Signed)
 Consultation Note Date: 02/25/2024 at 1036  Patient Name: Tina Curtis  [name]: [birth year]  MRN: [id number]  Age / Sex: 89 y.o., female  PCP: Abdul Fine, MD Referring Physician: Lenon Marien CROME, MD  HPI/Patient Profile: 89 y.o. female  with past medical history significant for HTN, depression/anxiety, HFpEF (60-65%), CVA on DAPT, CAD, MI, history of breast cancer status postmastectomy with chronic right arm lymphedema, arthritis, gout, neuropathy and seasonal allergies. Patient presented to ED from [facility] 02/22/2024 c/o nausea and 1 episode of coffee-ground emesis.   ED labs significant for WBC 13.2, RBC 3.08, Hgb 9.7, K+ 3.3, BUN 45, creatinine 2.24 and GFR 20.  UA positive for proteinuria.  COVID/RSV/flu negative.  CTAP demonstrated multiple sigmoid diverticula with wall thickening and minimal regional inflammatory/edematous change.  No abscess noted.  Multiple pancreatic cystic lesions as before with minimal increase in size.  3.9 cm infrarenal abdominal aortic aneurysm, previously 3.8 cm.  CXR showed mildly prominent hazy and reticular pulmonary opacities improved from prior exam.  Interval resolution of right pleural effusion.  Mild cardiomegaly.  ED vitals 155/107, HR 80, RR 13, 98.9 F.  Patient found to be hypoxic at 88% on room air improving to 97% with O2.  TRH was consulted for admission and management of sepsis, acute hypoxemic respiratory failure with suspected/suspected aspiration, hematemesis and acute diverticulitis.  Palliative care was consulted for assistance with goals of care conversations in the setting of worsening respiratory status with guarded prognosis.  Clinical Assessment and Goals of Care:  Extensive chart review completed prior to meeting patient including labs, vital signs, imaging, progress notes, orders, and available advanced directive documents from current  and previous encounters. I then met with patient and Tina Curtis (niece), Tina Curtis (niece), Tina Curtis (niece) to discuss diagnosis prognosis, GOC, EOL wishes, disposition and options.     Latest Ref Rng & Units 02/25/2024    3:51 AM 02/24/2024    4:52 AM 02/23/2024    5:30 AM  CBC  WBC 4.0 - 10.5 K/uL 9.2  8.8  10.1   Hemoglobin 12.0 - 15.0 g/dL 8.5  7.8  8.8   Hematocrit 36.0 - 46.0 % 26.1  25.0  27.5   Platelets 150 - 400 K/uL 142  132  152       Latest Ref Rng & Units 02/25/2024    3:51 AM 02/24/2024    4:52 AM 02/23/2024    5:30 AM  CMP  Glucose 70 - 99 mg/dL 898  74  91   BUN 8 - 23 mg/dL 41  38  37   Creatinine 0.44 - 1.00 mg/dL 7.54  8.00  7.93   Sodium 135 - 145 mmol/L 142  142  143   Potassium 3.5 - 5.1 mmol/L 4.0  3.4  4.0   Chloride 98 - 111 mmol/L 116  118  111   CO2 22 - 32 mmol/L 20  17  22    Calcium  8.9 - 10.3 mg/dL 9.0  7.6  9.2   Total Protein 6.5 -  8.1 g/dL   6.5   Total Bilirubin 0.0 - 1.2 mg/dL   0.4   Alkaline Phos 38 - 126 U/L   47   AST 15 - 41 U/L   16   ALT 0 - 44 U/L   9    Ill-appearing, elderly female lying in bed with family at bedside.  Currently on HFNC and does not respond to verbal or tactile stimuli.  She is not able to participate in goals of care conversation.  Respirations are even and unlabored.  She is in no distress.  Primary RN shares that patient was exhibiting terminal agitation earlier this morning prompting medication administration.  I introduced Palliative Medicine as specialized medical care for people living with serious illness. It focuses on providing relief from the symptoms and stress of a serious illness. The goal is to improve quality of life for both the patient and the family.  We discussed a brief life review of the patient.  Ms. Dinkel is widowed.  She has 2 children Tina Curtis and Tina Curtis.  Tina Curtis resides in [place]  and nieces share that she is currently en route from airport to the hospital.  Tina Curtis lives in [place]  and due to health  problems is unable to travel. Ms. Makarewicz has 4 grandchildren.  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  As far as functional and nutritional status, niece is at bedside to share that patient used to be in the independent living area at [facility] but had to transition to SNF after treatment for breast cancer (2021) resulting in inability to care for herself.  She is wheelchair/ bedbound at baseline.  Tina Curtis shares patient was able to ambulate small distances using walker with PT at facility.  She requires assistance with bathing and dressing.  She is able to feed herself.  Tina Curtis shares that Ms. Hermans had an excellent appetite prior to this admission and had gained 30 pounds after her breast cancer treatment.  We discussed patient's current illness and what it means in the larger context of patient's on-going co-morbidities.  Natural disease trajectory and expectations at EOL were discussed.  I attempted to elicit values and goals of care important to the patient.  Nieces at bedside chair they want Tina Curtis to specify goals but their goal at this time is to ensure Ms. Steinhaus is comfortable.  Tina Curtis later shares the goal is to make sure her mother does not suffer and is not in pain.   The difference between aggressive medical intervention and comfort care was considered in light of the patient's goals of care.   Advance directives, concepts specific to code status, artificial feeding and hydration, and rehospitalization were considered and discussed.  Confirmed that patient is a DNR. Tina Curtis and Tina Curtis also state that they would never want a feeding tube or to be on ventilator.   Education offered regarding concept specific to human mortality and the limitations of medical interventions to prolong life when the body begins to fail to thrive.  Family is facing treatment option decisions, advanced directive, and anticipatory care needs.  Nieces at  bedside shared that Tina Curtis is patient's Runner, broadcasting/film/video.   Discussed with patient/family the importance of continued conversation with family and the medical providers regarding overall plan of care and treatment options, ensuring decisions are within the context of the patient's values and GOCs.    Inpatient hospice services were discussed and offered. Family is not sure  about hospice care at this time.   Questions and concerns were addressed. The family was encouraged to call with questions or concerns.   Primary Decision Maker NEXT OF Tina Curtis, daughter  Physical Exam Vitals reviewed.  Constitutional:      General: She is not in acute distress.    Appearance: She is ill-appearing.  HENT:     Head: Normocephalic and atraumatic.  Pulmonary:     Effort: Pulmonary effort is normal. No respiratory distress.     Comments: HFNC Musculoskeletal:     Right lower leg: No edema.     Left lower leg: No edema.  Skin:    General: Skin is warm and dry.   Recommendations/Plan: DNR/DNI Continue current supportive interventions PMT to continue goals of care conversations  If condition does not improve or worsens, recommend transition to comfort care  Palliative Assessment/Data: 30%   Addendum 1250: Met with Tina Curtis, daughter, at bedside. She is able to verbalize understanding of her mother's worsening renal function and poor respiratory status and with concern for end of life. Tina Curtis shares that she is not ready to let her mother go yet. She wants to wait and assess response to treatment tomorrow. If she is no better or worse, she Tina Curtis consider comfort care at that time. Tina Curtis states that the most important thing for her mother is to ensure that she is not in pain and does not want her to suffer. Both Tina Curtis and Tina Curtis understand that comfort care is treating symptoms at end of life and not curative.  Discussed plan of care with Dr. Lenon and Kristin, RN  Thank you for this  consult. Palliative medicine Tina Curtis continue to follow and assist holistically.   Time Total: 105 minutes  Time spent includes: Detailed review of medical records (labs, imaging, vital signs), medically appropriate exam (mental status, respiratory, cardiac, skin), discussed with treatment team, counseling and educating patient, family and staff, documenting clinical information, medication management and coordination of care.     Devere Sacks, AMANDA [facility] Palliative Medicine Team  02/25/2024 10:36 AM  Office [phone]  Pager [phone]     Please contact Palliative Medicine Team providers via AMION for questions and concerns.

## 2024-02-25 NOTE — TOC Progression Note (Signed)
 Transition of Care [facility]) - Progression Note    Patient Details  Name: Tina Curtis MRN: [id number] Date of Birth: 1926-09-26  Transition of Care [facility]) CM/SW Contact  K'La JINNY Ruts, LCSW Phone Number: 02/25/2024, 10:44 AM  Clinical Narrative:    Chart reviewed. I spoke with the patient niece and the nurse today. They informed me that the patient health is declining and expected hospital passing.                      Expected Discharge Plan and Services                                               Social Drivers of Health (SDOH) Interventions SDOH Screenings   Food Insecurity: No Food Insecurity (02/22/2024)  Housing: Low Risk  (02/22/2024)  Transportation Needs: No Transportation Needs (02/22/2024)  Utilities: Not At Risk (02/22/2024)  Depression (PHQ2-9): Low Risk  (01/19/2024)  Financial Resource Strain: Low Risk  (11/17/2022)   Received from [facility]  Social Connections: Socially Isolated (02/22/2024)  Tobacco Use: Low Risk  (02/22/2024)   Received from [facility]    Readmission Risk Interventions    05/07/2023   12:51 PM 06/08/2022    3:30 PM  Readmission Risk Prevention Plan  Transportation Screening Complete Complete  PCP or Specialist Appt within 3-5 Days Complete   HRI or Home Care Consult Complete   Social Work Consult for Recovery Care Planning/Counseling Complete   Palliative Care Screening Complete Not Applicable  Medication Review Oceanographer) Complete Complete

## 2024-02-25 NOTE — Progress Notes (Signed)
 PROGRESS NOTE Tina Curtis  FMW:[id number] [name]: 1926/10/28 [name]: 02/22/2024 PCP: Abdul Fine, MD   Brief Narrative:  Tina Curtis is a 89 y.o. female with medical history significant for HTN, depression/anxiety, HFpEF, history of stroke on dual antiplatelet therapy, CAD s/p MI, arthritis, gout, neuropathy, seasonal allergies who came into ED from [facility] skilled nursing facility for 1 episode of vomiting when she woke up this morning described as coffee grounds.   02/25/24- no further vomiting since admission. Has significant worsening of respiratory status likely related to vomiting/aspiration event and pulmonary irritation.  Has significant anxiety, air hunger while on HHFNC.  Assessment & Plan:   Principal Problem:   Acute diverticulitis Active Problems:   Depression   Type II diabetes mellitus with renal manifestations (HCC)   Gout   Renovascular hypertension   Breast cancer (HCC)   History of breast cancer   Emphysema lung (HCC)   Chronic kidney disease, stage IV (severe) (HCC)   CAP (community acquired pneumonia)   (HFpEF) heart failure with preserved ejection fraction (HCC)   Coronary artery disease  Diverticulitis- CT with signs possible diverticulitis. Did not endorse abdominal pain today.  - analgesia PRN - continue cefepime/flagyl - follow cultures, NGTD  Sepsis Acute hypoxic respiratory failure  CAP- multifocal- suspect aspiration- was on 2L initially and overnight increased to 15Lnc. Continued to have significant tachypnea WOB requiring HHFNC and has been unable to wean. O2 requirement increasing. L She is having significant delirium and anxiety- well treated with PRN ativan  and morphine .  Her condition is guarded and I suspect end-of-life as she has not shown significant improvement to treatment so far and advanced age. Confirmed DNI/DNR. Family wants to continue to monitor for another day. We have discussed comfort care in detail and they are aware that  this is my recommendation if she does not improve.  Covid/flu/rsv neg - cefepime/flagyl as above, would cover for cap/aspiration - continue npo - IV fluids - palliative consulted.  - monitor fever curve - tylenol  PRN  # Hematemesis? Report of one episode emesis prior to arrival, reportedly to be coffee grounds. No recurrence. Baseline hgb 9-10. Had mild improvement today to 8.5. Multiple Bms since admission without evidence of acute bleed. Not seeking invasive interventions at this time. Open to transfusion if needed.  - CBC am  - continue IV PPI - monitor closely - trend hgb - stop heparin   # T2DM- Euglycemic here - stopped ssi - currently on D5NS  # History breast cancer- S/p mastectomy, on anastrozole , currently held. Has chronic R arm lymphedema. Will bring in arm sleeve.  - elevate arm  # CKD4- worsening today. Cr 2.45. poor perfusion, hypoxic injury # Renal artery stenosis.Prior renal artery stent - monitor  # GAD - home sertraline  # Gout - home alloprinol  # HTN Controlled - home amlodipine , hydralazine , valsartan, metoprolol   # Neuropathy - home lyrica   # HFrEF Does not appear to be exacerbated - hold lasix  while receiving IV fluids  # Goals of care For now wants to treat the treatable, has had decline last several months. Prognosis is guarded.  DVT prophylaxis: SCDs Code Status: dnr/dni, confirmed with daughter Family Communication: niece and daughter at bedside  Level of care: Stepdown Status is: Inpatient Remains inpatient appropriate because: severity of illness  Consultants:  Palliative   Procedures: none  Subjective: Reports wanting to see her husband (who is deceased). Describes back pain.   Objective: Vitals:   02/25/24 0530 02/25/24 0600 02/25/24  0630 02/25/24 0700  BP: (!) 146/61 (!) 145/73 (!) 154/71 (!) 156/65  Pulse: 64 66 66 67  Resp: 15 15 17  (!) 23  Temp:      TempSrc:      SpO2: 96% 96% 98% 97%  Weight:      Height:         Intake/Output Summary (Last 24 hours) at 02/25/2024 0816 Last data filed at 02/25/2024 0700 Gross per 24 hour  Intake 1184.73 ml  Output 703 ml  Net 481.73 ml   Filed Weights   02/22/24 0427 02/24/24 2200  Weight: 69.4 kg 79.5 kg    Examination: General exam: Appears in acute distress. lethargic Respiratory system: accessory muscle use, increased WOB. Poor perfusion to bases Cardiovascular system: S1 & S2 heard, RRR. Significant systolic murmur  Gastrointestinal system: Abdomen is nondistended, soft and mildly tender throughout Central nervous system: Alert but disoriented. Extremities: generalized trace edema worse on RUE Skin: No rashes, lesions or ulcers Psychiatry: delirious   Data Reviewed: I have personally reviewed following labs and imaging studies  CBC: Recent Labs  Lab 02/22/24 0432 02/23/24 0530 02/24/24 0452 02/25/24 0351  WBC 13.2* 10.1 8.8 9.2  NEUTROABS 9.8*  --   --   --   HGB 9.7* 8.8* 7.8* 8.5*  HCT 29.7* 27.5* 25.0* 26.1*  MCV 96.4 97.9 102.5* 99.6  PLT 170 152 132* 142*   Basic Metabolic Panel: Recent Labs  Lab 02/22/24 0522 02/23/24 0530 02/24/24 0452 02/25/24 0351  NA 141 143 142 142  K 3.3* 4.0 3.4* 4.0  CL 110 111 118* 116*  CO2 22 22 17* 20*  GLUCOSE 191* 91 74 101*  BUN 45* 37* 38* 41*  CREATININE 2.24* 2.06* 1.99* 2.45*  CALCIUM  9.8 9.2 7.6* 9.0   GFR: Estimated Creatinine Clearance: 13.7 mL/min (A) (by C-G formula based on SCr of 2.45 mg/dL (H)). Liver Function Tests: Recent Labs  Lab 02/22/24 0522 02/23/24 0530  AST 21 16  ALT 13 9  ALKPHOS 79 47  BILITOT 0.5 0.4  PROT 7.5 6.5  ALBUMIN  3.9 3.4*   Recent Labs  Lab 02/22/24 0432  LIPASE 50    LOS: 3 days   Marien LITTIE Piety, MD [facility]   If 7PM-7AM, please contact night-coverage [email or web address] Password [email or web address] 02/25/2024, 8:16 AM

## 2024-02-26 DIAGNOSIS — Z7189 Other specified counseling: Secondary | ICD-10-CM | POA: Diagnosis not present

## 2024-02-26 DIAGNOSIS — Z789 Other specified health status: Secondary | ICD-10-CM | POA: Diagnosis not present

## 2024-02-26 DIAGNOSIS — K5792 Diverticulitis of intestine, part unspecified, without perforation or abscess without bleeding: Secondary | ICD-10-CM | POA: Diagnosis not present

## 2024-02-26 DIAGNOSIS — Z515 Encounter for palliative care: Secondary | ICD-10-CM | POA: Diagnosis not present

## 2024-02-26 DIAGNOSIS — Z853 Personal history of malignant neoplasm of breast: Secondary | ICD-10-CM

## 2024-02-26 LAB — CBC
HCT: 25.2 % — ABNORMAL LOW (ref 36.0–46.0)
Hemoglobin: 8.3 g/dL — ABNORMAL LOW (ref 12.0–15.0)
MCH: 32.3 pg (ref 26.0–34.0)
MCHC: 32.9 g/dL (ref 30.0–36.0)
MCV: 98.1 fL (ref 80.0–100.0)
Platelets: 168 K/uL (ref 150–400)
RBC: 2.57 MIL/uL — ABNORMAL LOW (ref 3.87–5.11)
RDW: 16.3 % — ABNORMAL HIGH (ref 11.5–15.5)
WBC: 10.3 K/uL (ref 4.0–10.5)
nRBC: 0 % (ref 0.0–0.2)

## 2024-02-26 LAB — BASIC METABOLIC PANEL WITH GFR
Anion gap: 7 (ref 5–15)
BUN: 46 mg/dL — ABNORMAL HIGH (ref 8–23)
CO2: 20 mmol/L — ABNORMAL LOW (ref 22–32)
Calcium: 9.2 mg/dL (ref 8.9–10.3)
Chloride: 118 mmol/L — ABNORMAL HIGH (ref 98–111)
Creatinine, Ser: 2.28 mg/dL — ABNORMAL HIGH (ref 0.44–1.00)
GFR, Estimated: 19 mL/min — ABNORMAL LOW (ref >60)
Glucose, Bld: 158 mg/dL — ABNORMAL HIGH (ref 70–99)
Potassium: 3.8 mmol/L (ref 3.5–5.1)
Sodium: 145 mmol/L (ref 135–145)

## 2024-02-26 MED ORDER — ORAL CARE MOUTH RINSE: 15.0000 mL | OROMUCOSAL | Status: DC | PRN

## 2024-02-26 MED ORDER — LORAZEPAM 2 MG/ML IJ SOLN
0.5000 mg | INTRAMUSCULAR | Status: DC | PRN
  Administered 2024-02-26 – 2024-02-27 (×3): 0.5 mg via INTRAVENOUS
  Filled 2024-02-26 (×3): qty 1

## 2024-02-26 MED ORDER — MORPHINE SULFATE (PF) 2 MG/ML IV SOLN
2.0000 mg | INTRAVENOUS | Status: DC | PRN
  Administered 2024-02-26 – 2024-02-27 (×3): 2 mg via INTRAVENOUS
  Filled 2024-02-26 (×4): qty 1

## 2024-02-26 NOTE — Progress Notes (Signed)
 Progress Note   Patient: Tina Curtis FMW:[id number] [name]: 11-Mar-1927 [name]: 02/22/2024     4 DOS: the patient was seen and examined on 02/26/2024   Brief hospital course:  Tina Curtis is a 89 y.o. female with medical history significant for HTN, depression/anxiety, HFpEF, history of stroke on dual antiplatelet therapy, CAD s/p MI, arthritis, gout, neuropathy, seasonal allergies who came into ED from [facility] skilled nursing facility for 1 episode of vomiting when she woke up this morning described as coffee grounds.    02/25/24- no further vomiting since admission. Has significant worsening of respiratory status likely related to vomiting/aspiration event and pulmonary irritation.  Has significant anxiety, air hunger while on HHFNC.  02/26/24-- remains on HHF O2 at 45 L/min, bedside RN reports unsuccessful attempts to wean down so far.  Pt responds well to morphine  and ativan  being used for anxiety pain and air hunger.    Bedside conference with RN, Palliative Care, patient's daughter and niece on AM rounds.  Plan is likely to transition to comfort in 24 hours if pt does not demonstrate clinical improvement.   Assessment and Plan:  Diverticulitis- CT with signs possible diverticulitis. Did not endorse abdominal pain today.  - analgesia PRN - continue cefepime/flagyl - follow cultures, NGTD   Severe Sepsis Acute hypoxic respiratory failure  Multifocal PNA suspect aspiration-  Oxygen requirements escalated from initially 2L >> 15 L [place] night following admission. Has ongoing tachypnea and WOB requiring HHFNC and has been unable to wean.  Also having significant delirium and anxiety- well treated with PRN ativan  and morphine .  Pt condition remains guarded and family plan to transition to comfort care measures likely within next 24 hours.   Confirmed DNI/DNR.  Covid/flu/rsv neg MSRA PCR negative - continue cefepime/flagyl  - continue npo - off IV fluids - palliative consulted &  following closely - monitor fever curve - tylenol  PRN - IV ativan  and morphine  PRN    Hematemesis? Report of one episode emesis prior to arrival, reportedly to be coffee grounds. No recurrence. Baseline hgb 9-10. Had mild improvement today to 8.5. Multiple Bms since admission without evidence of acute bleed. Not seeking invasive interventions at this time. Open to transfusion if needed.  - CBC am  - continue IV PPI - monitor closely - trend hgb - off heparin    T2DM  - stopped ssi and CBG's   # History breast cancer- S/p mastectomy, on anastrozole , currently held. Has chronic R arm lymphedema. Will bring in arm sleeve.  - elevate arm   CKD4- slightly improved today. Cr 2.45 >> 2.28.  Due to poor perfusion, hypoxic injury  Hx of Renal artery stenosis.Prior renal artery stent - monitor   GAD - home sertraline   Gout - home alloprinol   HTN Controlled - home amlodipine , hydralazine , valsartan, metoprolol    Neuropathy - home lyrica    Chronic HFrEF Appears overall compensated - lasix  was held while on IV fluids - monitor & consider gentle IV diuresis (pt unable to take PO meds)   Goals of care For now continue to treat the treatable, transition to comfort measures when family are ready. DNR/DNI         Subjective: Pt seen with family at bedside this AM, with RN and Palliative care NP as well.  We discussed pt's lack of improvement, now despite 3 days of IV antibiotics.  Ongoing need for high-low O2 support.  Family plan to transition to comfort measure likely tomorrow if still no improvement.  Pt seems to respond well and remain comfortable after getting IV ativan  and morphine  together.   Physical Exam: Vitals:   02/26/24 1600 02/26/24 1700 02/26/24 1800 02/26/24 1815  BP: (!) 158/64 (!) 154/74 (!) 154/65   Pulse: 86 86 86 85  Resp: 15 (!) 21 14 13   Temp: 98.8 F (37.1 C)     TempSrc: Axillary     SpO2: 94% 95% 95% 95%  Weight:      Height:       General  exam: somnolent, minimally responsive, no acute distress HEENT: eyes closed for most part Respiratory system: diffuse coarse rhonchi, no wheezes, some accessory muscle use, on HHF O2 at 45 L/min with spO2 in mid 90's on monitor. Cardiovascular system: normal S1/S2, RRR Gastrointestinal system: soft, NT, ND Central nervous system: limited exam due to somnolence, does not follow commands Extremities: no edema, normal tone Skin: dry, intact, normal temperature   Data Reviewed:  Notable labs -- Cl 118, bicarb 20, glucose 158, BUN 46, Cr improved 2.45 >> 2.28, Hbg 8.3 stable  Family Communication: daughter and niece at bedside on rounds  Disposition: Status is: Inpatient Remains inpatient appropriate because: pt remains critically ill   Planned Discharge Destination: TBD, possible in hospital comfort measures vs hospice    Time spent: 52 minutes  Author: Burnard DELENA Cunning, DO 02/26/2024 6:32 PM  For on call review [email or web address].

## 2024-02-26 NOTE — Progress Notes (Signed)
 Palliative Care Progress Note, Assessment & Plan   Patient Name: Tina Curtis       Date: 02/26/2024 [name]: 1927-05-02  Age: 89 y.o. MRN#: [id number] Attending Physician: Fausto Burnard LABOR, DO Primary Care Physician: Abdul Fine, MD Admit Date: 02/22/2024  Subjective: Unable to assess  HPI: 89 y.o. female  with past medical history significant for HTN, depression/anxiety, HFpEF (60-65%), CVA on DAPT, CAD, MI, history of breast cancer status postmastectomy with chronic right arm lymphedema, arthritis, gout, neuropathy and seasonal allergies. Patient presented to ED from [facility] 02/22/2024 c/o nausea and 1 episode of coffee-ground emesis.    ED labs significant for WBC 13.2, RBC 3.08, Hgb 9.7, K+ 3.3, BUN 45, creatinine 2.24 and GFR 20.  UA positive for proteinuria.  COVID/RSV/flu negative.   CTAP demonstrated multiple sigmoid diverticula with wall thickening and minimal regional inflammatory/edematous change.  No abscess noted.  Multiple pancreatic cystic lesions as before with minimal increase in size.  3.9 cm infrarenal abdominal aortic aneurysm, previously 3.8 cm.   CXR showed mildly prominent hazy and reticular pulmonary opacities improved from prior exam.  Interval resolution of right pleural effusion.  Mild cardiomegaly.   ED vitals 155/107, HR 80, RR 13, 98.9 F.  Patient found to be hypoxic at 88% on room air improving to 97% with O2.   TR[facility] was consulted for admission and management of sepsis, acute hypoxemic respiratory failure with suspected/suspected aspiration, hematemesis and acute diverticulitis.   Palliative care was consulted for assistance with goals of care conversations in the setting of worsening respiratory status with guarded prognosis.  Summary of  counseling/coordination of care: Extensive chart review completed prior to meeting patient including labs, vital signs, imaging, progress notes, orders, and available advanced directive documents from current and previous encounters.   After reviewing the patient's chart and assessing the patient at bedside, I spoke with patient's daughter, Darice and niece, Will, in regards to symptom management and goals of care.   Ill-appearing, elderly female lying in bed with family at bedside. She opens eyes to verbal stimuli but does not track or acknowledge my presence and immediately falls back to sleep. Mildly tachypneic but in no distress.   Patient was assessed along side Dr. Fausto and Burnard, RN. Daughter endorses understanding that her mother is still requiring high amount of oxygen and frequent doses of medication for agitation and air hunger. Both Darice and [place] share understanding that Ms. Laperle is dependent of high flow oxygen to keep her alive. Darice tearfully states that she does not want her mother to die on her daughter's birthday, which is today. At this time, family wants to continue current course of treatment and readdress transition to comfort care tomorrow.   Therapeutic silence and active listening provided for family to share their thoughts and emotions regarding current medical situation.  Emotional support provided.  Physical Exam Vitals and nursing note reviewed.  Constitutional:      General: She is not in acute distress.    Appearance: She is ill-appearing.  HENT:     Head: Normocephalic and atraumatic.     Mouth/Throat:     Mouth: Mucous membranes are dry.  Pulmonary:  Effort: No respiratory distress.     Comments: Mildly tachypneic HFNC Musculoskeletal:     Right lower leg: No edema.     Left lower leg: No edema.  Skin:    General: Skin is warm and dry.            Recommendations/Plan: DNR/DNI Continue current supportive interventions PMT to continue  goals of care conversations  Family wants to readdress transition to comfort care tomorrow  Total Time 50 minutes   Plan of care discussed with Dr. Fausto and Burnard, RN  Time spent includes: Detailed review of medical records (labs, imaging, vital signs), medically appropriate exam (mental status, respiratory, cardiac, skin), discussed with treatment team, counseling and educating patient, family and staff, documenting clinical information, medication management and coordination of care.     Devere Sacks, AMANDA [facility] Palliative Medicine Team  02/26/2024 9:50 AM  Office [phone]  Pager [phone]

## 2024-02-26 NOTE — Progress Notes (Signed)
 Order received from provider to discontinue aquathermia.

## 2024-02-26 NOTE — Plan of Care (Signed)
  Problem: Education: Goal: Knowledge of General Education information will improve Description: Including pain rating scale, medication(s)/side effects and non-pharmacologic comfort measures Outcome: Not Progressing   Problem: Clinical Measurements: Goal: Ability to maintain clinical measurements within normal limits will improve Outcome: Not Progressing Goal: Will remain free from infection Outcome: Not Progressing Goal: Respiratory complications will improve Outcome: Not Progressing   Problem: Activity: Goal: Risk for activity intolerance will decrease Outcome: Not Progressing   Problem: Pain Managment: Goal: General experience of comfort will improve and/or be controlled Outcome: Not Progressing

## 2024-02-26 NOTE — Plan of Care (Signed)
 Continuing with plan of care.

## 2024-02-27 DIAGNOSIS — K5792 Diverticulitis of intestine, part unspecified, without perforation or abscess without bleeding: Secondary | ICD-10-CM | POA: Diagnosis not present

## 2024-02-27 DIAGNOSIS — Z515 Encounter for palliative care: Secondary | ICD-10-CM | POA: Diagnosis not present

## 2024-02-27 DIAGNOSIS — J439 Emphysema, unspecified: Secondary | ICD-10-CM | POA: Diagnosis not present

## 2024-02-27 DIAGNOSIS — E1122 Type 2 diabetes mellitus with diabetic chronic kidney disease: Secondary | ICD-10-CM

## 2024-02-27 DIAGNOSIS — J189 Pneumonia, unspecified organism: Secondary | ICD-10-CM | POA: Diagnosis not present

## 2024-02-27 DIAGNOSIS — N1832 Chronic kidney disease, stage 3b: Secondary | ICD-10-CM

## 2024-02-27 LAB — CBC
HCT: 26.6 % — ABNORMAL LOW (ref 36.0–46.0)
Hemoglobin: 8.7 g/dL — ABNORMAL LOW (ref 12.0–15.0)
MCH: 32.3 pg (ref 26.0–34.0)
MCHC: 32.7 g/dL (ref 30.0–36.0)
MCV: 98.9 fL (ref 80.0–100.0)
Platelets: 196 K/uL (ref 150–400)
RBC: 2.69 MIL/uL — ABNORMAL LOW (ref 3.87–5.11)
RDW: 16.2 % — ABNORMAL HIGH (ref 11.5–15.5)
WBC: 12.5 K/uL — ABNORMAL HIGH (ref 4.0–10.5)
nRBC: 0 % (ref 0.0–0.2)

## 2024-02-27 LAB — CULTURE, BLOOD (ROUTINE X 2)
Culture: NO GROWTH
Culture: NO GROWTH

## 2024-02-27 LAB — BASIC METABOLIC PANEL WITH GFR
Anion gap: 13 (ref 5–15)
BUN: 51 mg/dL — ABNORMAL HIGH (ref 8–23)
CO2: 16 mmol/L — ABNORMAL LOW (ref 22–32)
Calcium: 9.8 mg/dL (ref 8.9–10.3)
Chloride: 118 mmol/L — ABNORMAL HIGH (ref 98–111)
Creatinine, Ser: 2.36 mg/dL — ABNORMAL HIGH (ref 0.44–1.00)
GFR, Estimated: 19 mL/min — ABNORMAL LOW (ref >60)
Glucose, Bld: 143 mg/dL — ABNORMAL HIGH (ref 70–99)
Potassium: 4.2 mmol/L (ref 3.5–5.1)
Sodium: 147 mmol/L — ABNORMAL HIGH (ref 135–145)

## 2024-02-27 MED ORDER — GLYCOPYRROLATE 0.2 MG/ML IJ SOLN: 0.2000 mg | INTRAMUSCULAR | Status: DC | PRN

## 2024-02-27 MED ORDER — POLYVINYL ALCOHOL 1.4 % OP SOLN: 1.0000 [drp] | Freq: Four times a day (QID) | OPHTHALMIC | Status: DC | PRN

## 2024-02-27 MED ORDER — GLYCOPYRROLATE 0.2 MG/ML IJ SOLN
0.2000 mg | INTRAMUSCULAR | Status: DC | PRN
  Administered 2024-02-27: 0.2 mg via INTRAVENOUS
  Filled 2024-02-27: qty 1

## 2024-02-27 MED ORDER — GLYCOPYRROLATE 1 MG PO TABS
1.0000 mg | ORAL_TABLET | ORAL | Status: DC | PRN
Start: 2024-02-27 — End: 2024-02-27

## 2024-02-27 MED ORDER — LORAZEPAM 2 MG/ML IJ SOLN
1.0000 mg | INTRAMUSCULAR | Status: DC | PRN
  Administered 2024-02-27: 2 mg via INTRAVENOUS
  Filled 2024-02-27: qty 1

## 2024-02-27 MED ORDER — MORPHINE SULFATE (PF) 2 MG/ML IV SOLN
2.0000 mg | INTRAVENOUS | Status: DC | PRN
  Administered 2024-02-27 (×2): 4 mg via INTRAVENOUS
  Filled 2024-02-27 (×2): qty 2

## 2024-03-11 NOTE — Progress Notes (Signed)
 Per provider, patient is going to transition to comfort care.  Family at bedside.

## 2024-03-11 NOTE — Plan of Care (Signed)
  Problem: Skin Integrity: Goal: Risk for impaired skin integrity will decrease Outcome: Progressing   Problem: Clinical Measurements: Goal: Ability to maintain clinical measurements within normal limits will improve Outcome: Progressing   Problem: Elimination: Goal: Will not experience complications related to urinary retention Outcome: Progressing   Problem: Pain Managment: Goal: General experience of comfort will improve and/or be controlled Outcome: Progressing

## 2024-03-11 NOTE — Progress Notes (Signed)
 At 1334 patient was without apical heart rate and respirations for one full minute, verified with second RN, [place] Ina.  Family at bedside and body to be released to [place].

## 2024-03-11 NOTE — Death Summary Note (Signed)
 DEATH SUMMARY   Patient Details  Name: Tina Curtis MRN: [id number] [name]: 03-28-1927 ERE:Azjfzm, Richerd, MD Admission/Discharge Information   Admit Date:  02-28-24  Date of Death: Date of Death: March 04, 2024  Time of Death: Time of Death: 20-Mar-1333  Length of Stay: 5   Principle Cause of death: Acute respiratory failure with hypoxia due to multifocal aspiration pneumonia  Hospital Diagnoses: Principal Problem:   Acute diverticulitis Active Problems:   Depression   Type II diabetes mellitus with renal manifestations (HCC)   Gout   Renovascular hypertension   Breast cancer (HCC)   History of breast cancer   Emphysema lung (HCC)   Chronic kidney disease, stage IV (severe) (HCC)   CAP (community acquired pneumonia)   (HFpEF) heart failure with preserved ejection fraction (HCC)   Coronary artery disease   Hospital Course:  Tina Curtis is a 89 y.o. female with medical history significant for HTN, depression/anxiety, HFpEF, history of stroke on dual antiplatelet therapy, CAD s/p MI, arthritis, gout, neuropathy, seasonal allergies who came into ED from [facility] skilled nursing facility for 1 episode of vomiting when she woke up this morning described as coffee grounds.    02/25/24- no further vomiting since admission. Has significant worsening of respiratory status likely related to vomiting/aspiration event and pulmonary irritation.  Has significant anxiety, air hunger while on HHFNC.   02/26/24-- remains on HHF O2 at 45 L/min, bedside RN reports unsuccessful attempts to wean down so far.  Pt responds well to morphine  and ativan  being used for anxiety pain and air hunger.     Bedside conference with RN, Palliative Care, patient's daughter and niece on AM rounds.  Plan is likely to transition to comfort in 24 hours if pt does not demonstrate clinical improvement.  2024/03/04 -- met with family at bedside on AM rounds. They decided to proceed with transitioning to comfort care  measures.  Orders were placed, pt transitioned well without signs of pain, dyspnea or distress on comfort medications.  Pt subsequently passed away this afternoon.   Assessment and Plan:  Goals of care -- Comfort Care Measures Onlye --Transitioned to comfort measures this AM --Comfort meds PRN per orders --Chaplain to bedside  --Lift visitor restrictions  ==============================  Diverticulitis- CT with signs possible diverticulitis. Did not endorse abdominal pain today.  - analgesia PRN - continue cefepime/flagyl - follow cultures, NGTD   Severe Sepsis Acute hypoxic respiratory failure  Multifocal PNA suspect aspiration-  Oxygen requirements escalated from initially 2L >> 15 L [place] night following admission. Has ongoing tachypnea and WOB requiring HHFNC and has been unable to wean.  Also having significant delirium and anxiety- well treated with PRN ativan  and morphine .  Pt condition remains guarded and family plan to transition to comfort care measures likely within next 24 hours.   Confirmed DNI/DNR.  Covid/flu/rsv neg MSRA PCR negative - continue cefepime/flagyl  - continue npo - off IV fluids - palliative consulted & following closely - monitor fever curve - tylenol  PRN - IV ativan  and morphine  PRN    Hematemesis? Report of one episode emesis prior to arrival, reportedly to be coffee grounds. No recurrence. Baseline hgb 9-10. Had mild improvement today to 8.5. Multiple Bms since admission without evidence of acute bleed. Not seeking invasive interventions at this time. Open to transfusion if needed.  - CBC am  - continue IV PPI - monitor closely - trend hgb - off heparin    T2DM  - stopped ssi and CBG's   #  History breast cancer- S/p mastectomy, on anastrozole , currently held. Has chronic R arm lymphedema. Will bring in arm sleeve.  - elevate arm   CKD4- slightly improved today. Cr 2.45 >> 2.28.  Due to poor perfusion, hypoxic injury   Hx of Renal artery  stenosis.Prior renal artery stent - monitor   GAD - home sertraline   Gout - home alloprinol   HTN Controlled - home amlodipine , hydralazine , valsartan, metoprolol    Neuropathy - home lyrica    Chronic HFrEF Appears overall compensated - lasix  was held while on IV fluids - monitor & consider gentle IV diuresis (pt unable to take PO meds)         Procedures: None  Consultations: Palliative Care  The results of significant diagnostics from this hospitalization (including imaging, microbiology, ancillary and laboratory) are listed below for reference.   Significant Diagnostic Studies: DG Chest 1 View Result Date: 02/23/2024 EXAM: 1 VIEW(S) XRAY OF THE CHEST 02/23/2024 10:22:00 PM COMPARISON: CT chest 06/29/2023, chest x-ray 02/22/2024. CLINICAL HISTORY: 200808 Hypoxia [id number]. Hypoxia. FINDINGS: LUNGS AND PLEURA: Chronic coarsened interstitial markings with developing superimposed patchy airspace opacities. Bilateral trace pleural effusions. No pulmonary edema. No pneumothorax. HEART AND MEDIASTINUM: Atherosclerotic plaque of the aorta. The cardiac silhouette is unremarkable. BONES AND SOFT TISSUES: No acute osseous abnormality. IMPRESSION: 1. Chronic coarsened interstitial markings with developing superimposed patchy airspace opacities. 2. Bilateral trace pleural effusions. 3. Atherosclerotic plaque. Electronically signed by: Kate Plummer MD 02/23/2024 10:34 PM EDT RP Workstation: [id number]   CT ABDOMEN PELVIS WO CONTRAST Result Date: 02/22/2024 EXAM: CT ABDOMEN AND PELVIS WITHOUT CONTRAST 02/22/2024 07:19:00 AM TECHNIQUE: CT of the abdomen and pelvis was performed without the administration of intravenous contrast. Multiplanar reformatted images are provided for review. Automated exposure control, iterative reconstruction, and/or weight-based adjustment of the mA/kV was utilized to reduce the radiation dose to as low as reasonably achievable. COMPARISON: 08/22/2022 CLINICAL  HISTORY: Acute nonlocalized abdominal pain, nausea, and vomiting. Patient had one episode of emesis with reported blood. FINDINGS: LOWER CHEST: Scattered coronary calcifications. Dependent atelectasis in the visualized lung bases. LIVER: The liver is unremarkable. GALLBLADDER AND BILE DUCTS: Gallbladder is unremarkable. No biliary ductal dilatation. SPLEEN: No acute abnormality. PANCREAS: Multiple cystic lesions in the pancreas, largest 3.1 cm in the anterior mid body (previously 2.7 cm), multiple lesions in the tail largest 2.4 cm (previously 2.6 cm) and a separate cluster in the region of the head and uncinate process, largest 2.3 cm (previously 2.0 cm). No ductal dilatation or regional inflammatory change. ADRENAL GLANDS: No acute abnormality. KIDNEYS, URETERS AND BLADDER: Marked left renal parenchymal atrophy as before. No stones in the kidneys or ureters. No hydronephrosis. No perinephric or periureteral stranding. Urinary bladder is unremarkable. GI AND BOWEL: Stomach demonstrates no acute abnormality. Multiple sigmoid diverticula with wall thickening, minimal regional inflammatory/edematous change, no abscess. There is no bowel obstruction. PERITONEUM AND RETROPERITONEUM: No ascites. No free air. VASCULATURE: Moderate calcified aortoiliac plaque. Stent at the origin of the right renal artery as before. 3.9 cm infrarenal abdominal aortic aneurysm (previously 3.8 cm). LYMPH NODES: No lymphadenopathy. REPRODUCTIVE ORGANS: No acute abnormality. BONES AND SOFT TISSUES: Vertebral endplate spurring at multiple levels in the lower thoracic spine. Multilevel spondylitic change in the lower lumbar spine with grade 1 anterolisthesis L5-S1 presumably related to bilateral facet degenerative joint disease (DJD). Osteitis pubis. No acute osseous abnormality. No focal soft tissue abnormality. IMPRESSION: 1. Multiple sigmoid diverticula with wall thickening and minimal regional inflammatory/edematous change. No abscess. 2.  Multiple pancreatic cystic lesions  as before, with minimal increase in size. 3. 3.9 cm infrarenal abdominal aortic aneurysm, previously 3.8 cm. According to ACR 2013 guidelines, the recommendation is 2-year surveillance interval. Electronically signed by: Katheleen Faes MD 02/22/2024 08:03 AM EDT RP Workstation: [id number]   DG Chest Port 1 View Result Date: 02/22/2024 EXAM: 1 VIEW(S) XRAY OF THE CHEST 02/22/2024 07:32:12 AM COMPARISON: Radiograph chest dated 06/29/2023. CLINICAL HISTORY: Questionable sepsis - evaluate for abnormality. Patient reports feeling sick after eating onion rings last night, with 1 episode of emesis 1 hour prior to ED arrival, described as having blood (not bright red or coffee ground). FINDINGS: LUNGS AND PLEURA: Mildly prominent hazy and reticular pulmonary opacities, improved relative to the prior study. Interval resolution of right-sided pleural effusion. No pulmonary edema. No pneumothorax. HEART AND MEDIASTINUM: The heart is mildly enlarged. No acute abnormality of the mediastinal silhouette. BONES AND SOFT TISSUES: Surgical clips are noted in the right axilla. No acute osseous abnormality. IMPRESSION: 1. Mildly prominent hazy and reticular pulmonary opacities, improved from prior. 2. Interval resolution of right pleural effusion. 3. Mild cardiomegaly. Electronically signed by: Evalene Coho MD 02/22/2024 07:37 AM EDT RP Workstation: [id number]    Microbiology: Recent Results (from the past 240 hours)  Blood Culture (routine x 2)     Status: None   Collection Time: 02/22/24  7:16 AM   Specimen: BLOOD  Result Value Ref Range Status   Specimen Description BLOOD BLOOD LEFT ARM  Final   Special Requests   Final    BOTTLES DRAWN AEROBIC AND ANAEROBIC Blood Culture results may not be optimal due to an inadequate volume of blood received in culture bottles   Culture   Final    NO GROWTH 5 DAYS Performed at [facility], [address] [place], [place]  [address]    Report Status 2024-03-11 FINAL  Final  Blood Culture (routine x 2)     Status: None   Collection Time: 02/22/24  7:16 AM   Specimen: BLOOD  Result Value Ref Range Status   Specimen Description BLOOD BLOOD RIGHT HAND  Final   Special Requests   Final    BOTTLES DRAWN AEROBIC AND ANAEROBIC Blood Culture results may not be optimal due to an inadequate volume of blood received in culture bottles   Culture   Final    NO GROWTH 5 DAYS Performed at [facility], [address] [place], [place] [address]    Report Status Mar 11, 2024 FINAL  Final  Resp panel by RT-PCR (RSV, Flu A&B, Covid) Anterior Nasal Swab     Status: None   Collection Time: 02/22/24  7:16 AM   Specimen: Anterior Nasal Swab  Result Value Ref Range Status   SARS Coronavirus 2 by RT PCR NEGATIVE NEGATIVE Final    Comment: (NOTE) SARS-CoV-2 target nucleic acids are NOT DETECTED.  The SARS-CoV-2 RNA is generally detectable in upper respiratory specimens during the acute phase of infection. The lowest concentration of SARS-CoV-2 viral copies this assay can detect is 138 copies/mL. A negative result does not preclude SARS-Cov-2 infection and should not be used as the sole basis for treatment or other patient management decisions. A negative result may occur with  improper specimen collection/handling, submission of specimen other than nasopharyngeal swab, presence of viral mutation(s) within the areas targeted by this assay, and inadequate number of viral copies(<138 copies/mL). A negative result must be combined with clinical observations, patient history, and epidemiological information. The expected result is Negative.  Fact Sheet for Patients:  bloggercourse.com  Fact Sheet for Healthcare Providers:  seriousbroker.it  This test is no t yet approved or cleared by the [place]  FDA and  has been authorized for detection and/or diagnosis of  SARS-CoV-2 by FDA under an Emergency Use Authorization (EUA). This EUA will remain  in effect (meaning this test can be used) for the duration of the COVID-19 declaration under Section 564(b)(1) of the Act, 21 U.S.C.section 360bbb-3(b)(1), unless the authorization is terminated  or revoked sooner.       Influenza A by PCR NEGATIVE NEGATIVE Final   Influenza B by PCR NEGATIVE NEGATIVE Final    Comment: (NOTE) The Xpert Xpress SARS-CoV-2/FLU/RSV plus assay is intended as an aid in the diagnosis of influenza from Nasopharyngeal swab specimens and should not be used as a sole basis for treatment. Nasal washings and aspirates are unacceptable for Xpert Xpress SARS-CoV-2/FLU/RSV testing.  Fact Sheet for Patients: bloggercourse.com  Fact Sheet for Healthcare Providers: seriousbroker.it  This test is not yet approved or cleared by the [place]  FDA and has been authorized for detection and/or diagnosis of SARS-CoV-2 by FDA under an Emergency Use Authorization (EUA). This EUA will remain in effect (meaning this test can be used) for the duration of the COVID-19 declaration under Section 564(b)(1) of the Act, 21 U.S.C. section 360bbb-3(b)(1), unless the authorization is terminated or revoked.     Resp Syncytial Virus by PCR NEGATIVE NEGATIVE Final    Comment: (NOTE) Fact Sheet for Patients: bloggercourse.com  Fact Sheet for Healthcare Providers: seriousbroker.it  This test is not yet approved or cleared by the [place]  FDA and has been authorized for detection and/or diagnosis of SARS-CoV-2 by FDA under an Emergency Use Authorization (EUA). This EUA will remain in effect (meaning this test can be used) for the duration of the COVID-19 declaration under Section 564(b)(1) of the Act, 21 U.S.C. section 360bbb-3(b)(1), unless the authorization is terminated  or revoked.  Performed at [facility], [address] [place], [place] [address]   Respiratory (~20 pathogens) panel by PCR     Status: None   Collection Time: 02/23/24 12:54 PM   Specimen: Nasopharyngeal Swab; Respiratory  Result Value Ref Range Status   Adenovirus NOT DETECTED NOT DETECTED Corrected   Coronavirus 229E NOT DETECTED NOT DETECTED Corrected    Comment: (NOTE) The Coronavirus on the Respiratory Panel, DOES NOT test for the novel  Coronavirus (2019 nCoV) CORRECTED ON 10/15 AT 2312: PREVIOUSLY REPORTED AS NOT DETECTED    Coronavirus HKU1 NOT DETECTED NOT DETECTED Corrected   Coronavirus NL63 NOT DETECTED NOT DETECTED Corrected   Coronavirus OC43 NOT DETECTED NOT DETECTED Corrected   Metapneumovirus NOT DETECTED NOT DETECTED Corrected   Rhinovirus / Enterovirus NOT DETECTED NOT DETECTED Corrected   Influenza A NOT DETECTED NOT DETECTED Corrected   Influenza B NOT DETECTED NOT DETECTED Corrected   Parainfluenza Virus 1 NOT DETECTED NOT DETECTED Corrected   Parainfluenza Virus 2 NOT DETECTED NOT DETECTED Corrected   Parainfluenza Virus 3 NOT DETECTED NOT DETECTED Corrected   Parainfluenza Virus 4 NOT DETECTED NOT DETECTED Corrected   Respiratory Syncytial Virus NOT DETECTED NOT DETECTED Corrected   Bordetella pertussis NOT DETECTED NOT DETECTED Corrected   Bordetella Parapertussis NOT DETECTED NOT DETECTED Corrected   Chlamydophila pneumoniae NOT DETECTED NOT DETECTED Corrected   Mycoplasma pneumoniae NOT DETECTED NOT DETECTED Corrected    Comment: Performed at [facility], [address], [place], [place] [address]  MRSA Next Gen by PCR, Nasal  Status: None   Collection Time: 02/24/24  9:50 PM   Specimen: Nasal Mucosa; Nasal Swab  Result Value Ref Range Status   MRSA by PCR Next Gen NOT DETECTED NOT DETECTED Final    Comment: (NOTE) The GeneXpert MRSA Assay (FDA approved for NASAL specimens only), is one component of a comprehensive MRSA  colonization surveillance program. It is not intended to diagnose MRSA infection nor to guide or monitor treatment for MRSA infections. Test performance is not FDA approved in patients less than 45 years old. Performed at [facility], [address] [place], [place] [address]     Time spent: 15 minutes  Signed: Burnard DELENA Cunning, DO 12-Mar-2024

## 2024-03-11 NOTE — Progress Notes (Signed)
 Patient given morphine  prn as ordered for air hunger and switched from [facility] to 2L [place] for transition to comfort care.

## 2024-03-11 NOTE — Progress Notes (Signed)
 Palliative Care Progress Note, Assessment & Plan   Patient Name: Tina Curtis       Date: 03/11/24 [name]: 10/26/1926  Age: 89 y.o. MRN#: [id number] Attending Physician: Fausto Burnard LABOR, DO Primary Care Physician: Abdul Fine, MD Admit Date: 02/22/2024  Subjective: Unable to assess  HPI: 89 y.o. female  with past medical history significant for HTN, depression/anxiety, HFpEF (60-65%), CVA on DAPT, CAD, MI, history of breast cancer status postmastectomy with chronic right arm lymphedema, arthritis, gout, neuropathy and seasonal allergies. Patient presented to ED from [facility] 02/22/2024 c/o nausea and 1 episode of coffee-ground emesis.    ED labs significant for WBC 13.2, RBC 3.08, Hgb 9.7, K+ 3.3, BUN 45, creatinine 2.24 and GFR 20.  UA positive for proteinuria.  COVID/RSV/flu negative.   CTAP demonstrated multiple sigmoid diverticula with wall thickening and minimal regional inflammatory/edematous change.  No abscess noted.  Multiple pancreatic cystic lesions as before with minimal increase in size.  3.9 cm infrarenal abdominal aortic aneurysm, previously 3.8 cm.   CXR showed mildly prominent hazy and reticular pulmonary opacities improved from prior exam.  Interval resolution of right pleural effusion.  Mild cardiomegaly.   ED vitals 155/107, HR 80, RR 13, 98.9 F.  Patient found to be hypoxic at 88% on room air improving to 97% with O2.   TRH was consulted for admission and management of sepsis, acute hypoxemic respiratory failure with suspected/suspected aspiration, hematemesis and acute diverticulitis.   Palliative care was consulted for assistance with goals of care conversations in the setting of worsening respiratory status with guarded prognosis.    Summary of  counseling/coordination of care: Extensive chart review completed prior to meeting patient including labs, vital signs, imaging, progress notes, orders, and available advanced directive documents from current and previous encounters.      Latest Ref Rng & Units 03/11/24    4:14 AM 02/26/2024    5:58 AM 02/25/2024    3:51 AM  CBC  WBC 4.0 - 10.5 K/uL 12.5  10.3  9.2   Hemoglobin 12.0 - 15.0 g/dL 8.7  8.3  8.5   Hematocrit 36.0 - 46.0 % 26.6  25.2  26.1   Platelets 150 - 400 K/uL 196  168  142       Latest Ref Rng & Units 2024/03/11    4:14 AM 02/26/2024    5:58 AM 02/25/2024    3:51 AM  CMP  Glucose 70 - 99 mg/dL 856  841  898   BUN 8 - 23 mg/dL 51  46  41   Creatinine 0.44 - 1.00 mg/dL 7.63  7.71  7.54   Sodium 135 - 145 mmol/L 147  145  142   Potassium 3.5 - 5.1 mmol/L 4.2  3.8  4.0   Chloride 98 - 111 mmol/L 118  118  116   CO2 22 - 32 mmol/L 16  20  20    Calcium  8.9 - 10.3 mg/dL 9.8  9.2  9.0     After reviewing the patient's chart and assessing the patient at bedside, I spoke with patient in regards to symptom management and goals of care.   Ill-appearing, elderly female lying in bed. She does  not respond to verbal or tactile stimuli. Mildly tachypneic. She is in no distress.   On arrival to room, Will, shares they are ready. Burnard, RN at bedside is preparing for transition to comfort measures, to include removal of HFNC with transition to [place] and administration of morphine  for comfort. Darice (daughter) and Will (niece) tearfully reassure Tina Curtis that they will take care of family here and it is okay for her to let go.   Provided emotional support for family and then exited room to allow family privacy with patient in the last moments of life.   Therapeutic silence and active listening provided for family to share her thoughts and emotions regarding current medical situation.  Emotional support provided.  Physical Exam Vitals and nursing note reviewed.   Constitutional:      General: She is not in acute distress.    Appearance: She is ill-appearing.  HENT:     Head: Normocephalic and atraumatic.  Pulmonary:     Effort: No respiratory distress.     Comments: Mild tachypneic   Musculoskeletal:     Right lower leg: No edema.     Left lower leg: No edema.  Skin:    General: Skin is warm and dry.             Recommendations/Plan: Comfort measures only  Medicate for signs/symptoms of pain, distress, anxiety, air hunger     Anticipate hospital death  Total Time 50 minutes   Discussed plan of care with Dr. Fausto and Burnard, RN   Time spent includes: Detailed review of medical records (labs, imaging, vital signs), medically appropriate exam (mental status, respiratory, cardiac, skin), discussed with treatment team, counseling and educating patient, family and staff, documenting clinical information, medication management and coordination of care.     Devere Sacks, ELNITA- [facility] Palliative Medicine Team  03-21-2024 8:32 AM  Office [phone]  Pager [phone]

## 2024-03-11 NOTE — Plan of Care (Signed)
 Continuing with plan of care.

## 2024-03-11 NOTE — Progress Notes (Signed)
   03/11/24 1055  Spiritual Encounters  Type of Visit Initial  Care provided to: Pt and family;Friend  Referral source Physician  Reason for visit End-of-life  OnCall Visit Yes   Provided spiritual support to daughter and per physician request during transition of life. Church friends present and minister will arrive later today.

## 2024-03-11 DEATH — deceased

## 2024-07-18 ENCOUNTER — Encounter (INDEPENDENT_AMBULATORY_CARE_PROVIDER_SITE_OTHER)

## 2024-07-18 ENCOUNTER — Ambulatory Visit (INDEPENDENT_AMBULATORY_CARE_PROVIDER_SITE_OTHER): Admitting: Nurse Practitioner
